# Patient Record
Sex: Male | Born: 1935 | Race: White | Hispanic: No | Smoking: Never smoker
Health system: Southern US, Community
[De-identification: ages and names within clinical notes are randomized; demographics above are authoritative.]

## PROBLEM LIST (undated history)

## (undated) DIAGNOSIS — M545 Low back pain, unspecified: Secondary | ICD-10-CM

## (undated) DIAGNOSIS — I4891 Unspecified atrial fibrillation: Secondary | ICD-10-CM

## (undated) DIAGNOSIS — R51 Headache: Secondary | ICD-10-CM

## (undated) DIAGNOSIS — M1990 Unspecified osteoarthritis, unspecified site: Secondary | ICD-10-CM

## (undated) DIAGNOSIS — I499 Cardiac arrhythmia, unspecified: Secondary | ICD-10-CM

## (undated) DIAGNOSIS — I10 Essential (primary) hypertension: Secondary | ICD-10-CM

## (undated) DIAGNOSIS — I2510 Atherosclerotic heart disease of native coronary artery without angina pectoris: Secondary | ICD-10-CM

## (undated) DIAGNOSIS — R55 Syncope and collapse: Secondary | ICD-10-CM

## (undated) DIAGNOSIS — Z8719 Personal history of other diseases of the digestive system: Secondary | ICD-10-CM

## (undated) DIAGNOSIS — E785 Hyperlipidemia, unspecified: Secondary | ICD-10-CM

## (undated) DIAGNOSIS — R0600 Dyspnea, unspecified: Secondary | ICD-10-CM

## (undated) DIAGNOSIS — I209 Angina pectoris, unspecified: Secondary | ICD-10-CM

## (undated) DIAGNOSIS — R079 Chest pain, unspecified: Secondary | ICD-10-CM

## (undated) DIAGNOSIS — K922 Gastrointestinal hemorrhage, unspecified: Secondary | ICD-10-CM

## (undated) DIAGNOSIS — I219 Acute myocardial infarction, unspecified: Secondary | ICD-10-CM

## (undated) DIAGNOSIS — K219 Gastro-esophageal reflux disease without esophagitis: Secondary | ICD-10-CM

## (undated) DIAGNOSIS — C4359 Malignant melanoma of other part of trunk: Secondary | ICD-10-CM

## (undated) DIAGNOSIS — G8929 Other chronic pain: Secondary | ICD-10-CM

## (undated) DIAGNOSIS — R519 Headache, unspecified: Secondary | ICD-10-CM

## (undated) DIAGNOSIS — R413 Other amnesia: Secondary | ICD-10-CM

## (undated) DIAGNOSIS — Z9289 Personal history of other medical treatment: Secondary | ICD-10-CM

## (undated) HISTORY — PX: BACK SURGERY: SHX140

## (undated) HISTORY — PX: CORONARY ANGIOPLASTY WITH STENT PLACEMENT: SHX49

## (undated) HISTORY — DX: Syncope and collapse: R55

## (undated) HISTORY — PX: ANTERIOR CERVICAL DECOMP/DISCECTOMY FUSION: SHX1161

## (undated) HISTORY — DX: Other amnesia: R41.3

## (undated) HISTORY — PX: CERVICAL DISC SURGERY: SHX588

## (undated) HISTORY — PX: LAMINECTOMY: SHX219

## (undated) HISTORY — DX: Gastrointestinal hemorrhage, unspecified: K92.2

## (undated) HISTORY — PX: MELANOMA EXCISION: SHX5266

## (undated) HISTORY — DX: Hyperlipidemia, unspecified: E78.5

## (undated) HISTORY — DX: Other chronic pain: G89.29

## (undated) HISTORY — DX: Essential (primary) hypertension: I10

## (undated) HISTORY — PX: CARDIAC CATHETERIZATION: SHX172

## (undated) HISTORY — PX: POSTERIOR LAMINECTOMY / DECOMPRESSION CERVICAL SPINE: SUR739

## (undated) HISTORY — DX: Dyspnea, unspecified: R06.00

## (undated) HISTORY — PX: CATARACT EXTRACTION W/ INTRAOCULAR LENS  IMPLANT, BILATERAL: SHX1307

## (undated) HISTORY — DX: Chest pain, unspecified: R07.9

## (undated) HISTORY — DX: Unspecified atrial fibrillation: I48.91

## (undated) HISTORY — DX: Atherosclerotic heart disease of native coronary artery without angina pectoris: I25.10

## (undated) HISTORY — PX: TONSILLECTOMY: SUR1361

## (undated) HISTORY — DX: Gastro-esophageal reflux disease without esophagitis: K21.9

---

## 1995-03-07 ENCOUNTER — Encounter: Payer: Self-pay | Admitting: Gastroenterology

## 1998-11-24 ENCOUNTER — Observation Stay (HOSPITAL_COMMUNITY): Admission: AD | Admit: 1998-11-24 | Discharge: 1998-11-25 | Payer: Self-pay | Admitting: *Deleted

## 1999-10-18 DIAGNOSIS — I219 Acute myocardial infarction, unspecified: Secondary | ICD-10-CM

## 1999-10-18 HISTORY — DX: Acute myocardial infarction, unspecified: I21.9

## 2001-05-24 ENCOUNTER — Encounter: Payer: Self-pay | Admitting: Emergency Medicine

## 2001-09-24 ENCOUNTER — Inpatient Hospital Stay (HOSPITAL_COMMUNITY): Admission: EM | Admit: 2001-09-24 | Discharge: 2001-09-27 | Payer: Self-pay | Admitting: Emergency Medicine

## 2001-09-24 ENCOUNTER — Encounter: Payer: Self-pay | Admitting: Emergency Medicine

## 2001-12-19 ENCOUNTER — Encounter: Payer: Self-pay | Admitting: Emergency Medicine

## 2001-12-26 ENCOUNTER — Inpatient Hospital Stay (HOSPITAL_COMMUNITY): Admission: EM | Admit: 2001-12-26 | Discharge: 2001-12-28 | Payer: Self-pay | Admitting: Emergency Medicine

## 2001-12-26 ENCOUNTER — Encounter: Payer: Self-pay | Admitting: Internal Medicine

## 2001-12-27 ENCOUNTER — Encounter: Payer: Self-pay | Admitting: Internal Medicine

## 2002-01-12 ENCOUNTER — Observation Stay (HOSPITAL_COMMUNITY): Admission: EM | Admit: 2002-01-12 | Discharge: 2002-01-13 | Payer: Self-pay

## 2002-01-12 ENCOUNTER — Encounter (INDEPENDENT_AMBULATORY_CARE_PROVIDER_SITE_OTHER): Payer: Self-pay | Admitting: *Deleted

## 2002-11-01 ENCOUNTER — Inpatient Hospital Stay (HOSPITAL_COMMUNITY): Admission: AD | Admit: 2002-11-01 | Discharge: 2002-11-05 | Payer: Self-pay | Admitting: Cardiovascular Disease

## 2002-11-13 ENCOUNTER — Encounter: Payer: Self-pay | Admitting: *Deleted

## 2002-11-13 ENCOUNTER — Inpatient Hospital Stay (HOSPITAL_COMMUNITY): Admission: EM | Admit: 2002-11-13 | Discharge: 2002-11-16 | Payer: Self-pay | Admitting: Emergency Medicine

## 2003-01-08 ENCOUNTER — Inpatient Hospital Stay (HOSPITAL_COMMUNITY): Admission: EM | Admit: 2003-01-08 | Discharge: 2003-01-10 | Payer: Self-pay | Admitting: Emergency Medicine

## 2003-01-08 ENCOUNTER — Encounter: Payer: Self-pay | Admitting: Emergency Medicine

## 2003-08-01 ENCOUNTER — Ambulatory Visit (HOSPITAL_COMMUNITY): Admission: RE | Admit: 2003-08-01 | Discharge: 2003-08-01 | Payer: Self-pay | Admitting: Neurology

## 2003-08-25 ENCOUNTER — Ambulatory Visit (HOSPITAL_COMMUNITY): Admission: RE | Admit: 2003-08-25 | Discharge: 2003-08-25 | Payer: Self-pay | Admitting: Neurology

## 2003-08-28 ENCOUNTER — Inpatient Hospital Stay (HOSPITAL_COMMUNITY): Admission: RE | Admit: 2003-08-28 | Discharge: 2003-08-30 | Payer: Self-pay | Admitting: Neurological Surgery

## 2003-09-09 ENCOUNTER — Emergency Department (HOSPITAL_COMMUNITY): Admission: EM | Admit: 2003-09-09 | Discharge: 2003-09-09 | Payer: Self-pay | Admitting: Emergency Medicine

## 2003-11-06 ENCOUNTER — Inpatient Hospital Stay (HOSPITAL_COMMUNITY): Admission: AD | Admit: 2003-11-06 | Discharge: 2003-11-07 | Payer: Self-pay | Admitting: Internal Medicine

## 2003-12-19 ENCOUNTER — Encounter: Admission: RE | Admit: 2003-12-19 | Discharge: 2003-12-19 | Payer: Self-pay | Admitting: Gastroenterology

## 2003-12-19 ENCOUNTER — Encounter: Payer: Self-pay | Admitting: Gastroenterology

## 2004-07-30 ENCOUNTER — Emergency Department (HOSPITAL_COMMUNITY): Admission: EM | Admit: 2004-07-30 | Discharge: 2004-07-30 | Payer: Self-pay | Admitting: Emergency Medicine

## 2004-10-01 ENCOUNTER — Ambulatory Visit: Payer: Self-pay | Admitting: Gastroenterology

## 2004-10-04 ENCOUNTER — Ambulatory Visit: Payer: Self-pay | Admitting: Gastroenterology

## 2004-10-07 ENCOUNTER — Ambulatory Visit: Payer: Self-pay | Admitting: Gastroenterology

## 2004-11-07 ENCOUNTER — Emergency Department (HOSPITAL_COMMUNITY): Admission: EM | Admit: 2004-11-07 | Discharge: 2004-11-07 | Payer: Self-pay | Admitting: Emergency Medicine

## 2004-11-09 ENCOUNTER — Ambulatory Visit (HOSPITAL_COMMUNITY): Admission: RE | Admit: 2004-11-09 | Discharge: 2004-11-09 | Payer: Self-pay | Admitting: Neurological Surgery

## 2004-11-11 ENCOUNTER — Ambulatory Visit (HOSPITAL_COMMUNITY): Admission: RE | Admit: 2004-11-11 | Discharge: 2004-11-12 | Payer: Self-pay | Admitting: Neurological Surgery

## 2005-01-06 ENCOUNTER — Ambulatory Visit: Payer: Self-pay | Admitting: Internal Medicine

## 2005-03-05 ENCOUNTER — Inpatient Hospital Stay (HOSPITAL_COMMUNITY): Admission: EM | Admit: 2005-03-05 | Discharge: 2005-03-08 | Payer: Self-pay | Admitting: Emergency Medicine

## 2005-03-05 ENCOUNTER — Ambulatory Visit: Payer: Self-pay | Admitting: *Deleted

## 2005-03-21 ENCOUNTER — Ambulatory Visit: Payer: Self-pay | Admitting: Cardiology

## 2005-07-21 ENCOUNTER — Ambulatory Visit: Payer: Self-pay | Admitting: Internal Medicine

## 2005-08-09 ENCOUNTER — Ambulatory Visit: Payer: Self-pay | Admitting: Gastroenterology

## 2005-08-15 ENCOUNTER — Ambulatory Visit: Payer: Self-pay | Admitting: Gastroenterology

## 2005-12-24 ENCOUNTER — Inpatient Hospital Stay (HOSPITAL_COMMUNITY): Admission: EM | Admit: 2005-12-24 | Discharge: 2005-12-27 | Payer: Self-pay | Admitting: Emergency Medicine

## 2005-12-24 ENCOUNTER — Ambulatory Visit: Payer: Self-pay | Admitting: Internal Medicine

## 2006-01-02 ENCOUNTER — Ambulatory Visit: Payer: Self-pay | Admitting: Cardiology

## 2006-01-02 ENCOUNTER — Inpatient Hospital Stay (HOSPITAL_COMMUNITY): Admission: EM | Admit: 2006-01-02 | Discharge: 2006-01-04 | Payer: Self-pay | Admitting: Emergency Medicine

## 2006-01-30 ENCOUNTER — Ambulatory Visit: Payer: Self-pay | Admitting: Internal Medicine

## 2006-03-21 ENCOUNTER — Ambulatory Visit: Payer: Self-pay | Admitting: Cardiology

## 2006-03-28 ENCOUNTER — Ambulatory Visit: Payer: Self-pay

## 2006-04-07 ENCOUNTER — Ambulatory Visit: Payer: Self-pay | Admitting: Internal Medicine

## 2006-09-18 ENCOUNTER — Ambulatory Visit: Payer: Self-pay | Admitting: Internal Medicine

## 2007-01-09 ENCOUNTER — Ambulatory Visit: Payer: Self-pay | Admitting: Gastroenterology

## 2007-01-26 ENCOUNTER — Ambulatory Visit: Payer: Self-pay | Admitting: Gastroenterology

## 2007-01-26 LAB — CONVERTED CEMR LAB

## 2007-05-18 ENCOUNTER — Inpatient Hospital Stay (HOSPITAL_COMMUNITY): Admission: AD | Admit: 2007-05-18 | Discharge: 2007-05-21 | Payer: Self-pay | Admitting: Internal Medicine

## 2007-05-18 ENCOUNTER — Ambulatory Visit: Payer: Self-pay | Admitting: Internal Medicine

## 2007-06-01 ENCOUNTER — Ambulatory Visit: Payer: Self-pay | Admitting: Internal Medicine

## 2007-08-17 ENCOUNTER — Encounter: Admission: RE | Admit: 2007-08-17 | Discharge: 2007-08-17 | Payer: Self-pay | Admitting: Neurology

## 2007-10-04 ENCOUNTER — Ambulatory Visit: Payer: Self-pay | Admitting: Critical Care Medicine

## 2007-10-04 ENCOUNTER — Inpatient Hospital Stay (HOSPITAL_COMMUNITY): Admission: RE | Admit: 2007-10-04 | Discharge: 2007-10-14 | Payer: Self-pay | Admitting: Neurological Surgery

## 2007-10-04 ENCOUNTER — Ambulatory Visit: Payer: Self-pay | Admitting: Internal Medicine

## 2007-10-05 ENCOUNTER — Encounter (INDEPENDENT_AMBULATORY_CARE_PROVIDER_SITE_OTHER): Payer: Self-pay | Admitting: Neurological Surgery

## 2007-11-30 ENCOUNTER — Ambulatory Visit: Payer: Self-pay | Admitting: Internal Medicine

## 2008-02-06 ENCOUNTER — Ambulatory Visit: Payer: Self-pay | Admitting: *Deleted

## 2008-02-06 ENCOUNTER — Inpatient Hospital Stay (HOSPITAL_COMMUNITY): Admission: EM | Admit: 2008-02-06 | Discharge: 2008-02-07 | Payer: Self-pay | Admitting: Emergency Medicine

## 2008-03-14 ENCOUNTER — Ambulatory Visit: Payer: Self-pay | Admitting: Internal Medicine

## 2008-03-25 ENCOUNTER — Ambulatory Visit: Payer: Self-pay | Admitting: Internal Medicine

## 2008-03-29 ENCOUNTER — Emergency Department (HOSPITAL_COMMUNITY): Admission: EM | Admit: 2008-03-29 | Discharge: 2008-03-29 | Payer: Self-pay | Admitting: Emergency Medicine

## 2008-03-30 ENCOUNTER — Emergency Department (HOSPITAL_COMMUNITY): Admission: EM | Admit: 2008-03-30 | Discharge: 2008-03-30 | Payer: Self-pay | Admitting: Emergency Medicine

## 2008-07-14 ENCOUNTER — Telehealth: Payer: Self-pay | Admitting: Gastroenterology

## 2008-09-19 ENCOUNTER — Ambulatory Visit: Payer: Self-pay | Admitting: Internal Medicine

## 2008-09-19 LAB — CONVERTED CEMR LAB
BUN: 9 mg/dL (ref 6–23)
Basophils Absolute: 0.1 10*3/uL (ref 0.0–0.1)
Creatinine, Ser: 0.8 mg/dL (ref 0.4–1.5)
Eosinophils Relative: 4.6 % (ref 0.0–5.0)
GFR calc Af Amer: 121 mL/min
HCT: 35.5 % — ABNORMAL LOW (ref 39.0–52.0)
Hemoglobin: 11.9 g/dL — ABNORMAL LOW (ref 13.0–17.0)
INR: 1.1 — ABNORMAL HIGH (ref 0.8–1.0)
Lymphocytes Relative: 31.3 % (ref 12.0–46.0)
MCHC: 33.7 g/dL (ref 30.0–36.0)
Monocytes Absolute: 0.6 10*3/uL (ref 0.1–1.0)
Monocytes Relative: 12.1 % — ABNORMAL HIGH (ref 3.0–12.0)
Neutrophils Relative %: 51 % (ref 43.0–77.0)
Platelets: 163 10*3/uL (ref 150–400)
Prothrombin Time: 11.5 s (ref 10.9–13.3)
RDW: 14.4 % (ref 11.5–14.6)

## 2008-09-22 ENCOUNTER — Inpatient Hospital Stay (HOSPITAL_BASED_OUTPATIENT_CLINIC_OR_DEPARTMENT_OTHER): Admission: RE | Admit: 2008-09-22 | Discharge: 2008-09-22 | Payer: Self-pay | Admitting: Cardiology

## 2008-09-22 ENCOUNTER — Ambulatory Visit: Payer: Self-pay | Admitting: Cardiology

## 2008-10-16 ENCOUNTER — Ambulatory Visit: Payer: Self-pay | Admitting: Internal Medicine

## 2008-10-20 DIAGNOSIS — R0602 Shortness of breath: Secondary | ICD-10-CM | POA: Insufficient documentation

## 2008-10-20 DIAGNOSIS — I2510 Atherosclerotic heart disease of native coronary artery without angina pectoris: Secondary | ICD-10-CM

## 2008-10-20 DIAGNOSIS — E785 Hyperlipidemia, unspecified: Secondary | ICD-10-CM

## 2008-10-20 DIAGNOSIS — I10 Essential (primary) hypertension: Secondary | ICD-10-CM | POA: Insufficient documentation

## 2008-10-20 DIAGNOSIS — J45909 Unspecified asthma, uncomplicated: Secondary | ICD-10-CM | POA: Insufficient documentation

## 2008-10-20 DIAGNOSIS — K219 Gastro-esophageal reflux disease without esophagitis: Secondary | ICD-10-CM

## 2008-10-30 ENCOUNTER — Ambulatory Visit: Payer: Self-pay | Admitting: Emergency Medicine

## 2008-11-10 ENCOUNTER — Telehealth (INDEPENDENT_AMBULATORY_CARE_PROVIDER_SITE_OTHER): Payer: Self-pay | Admitting: *Deleted

## 2008-11-27 ENCOUNTER — Ambulatory Visit: Payer: Self-pay | Admitting: Emergency Medicine

## 2008-12-01 ENCOUNTER — Telehealth: Payer: Self-pay | Admitting: Emergency Medicine

## 2008-12-18 ENCOUNTER — Ambulatory Visit: Payer: Self-pay | Admitting: Emergency Medicine

## 2008-12-22 ENCOUNTER — Encounter: Payer: Self-pay | Admitting: Emergency Medicine

## 2009-01-21 ENCOUNTER — Ambulatory Visit: Payer: Self-pay | Admitting: Emergency Medicine

## 2009-05-01 DIAGNOSIS — Z87898 Personal history of other specified conditions: Secondary | ICD-10-CM | POA: Insufficient documentation

## 2009-05-01 DIAGNOSIS — Z8659 Personal history of other mental and behavioral disorders: Secondary | ICD-10-CM

## 2009-05-01 DIAGNOSIS — I252 Old myocardial infarction: Secondary | ICD-10-CM | POA: Insufficient documentation

## 2009-05-01 DIAGNOSIS — Z9889 Other specified postprocedural states: Secondary | ICD-10-CM

## 2009-05-01 DIAGNOSIS — J301 Allergic rhinitis due to pollen: Secondary | ICD-10-CM

## 2009-05-01 DIAGNOSIS — G609 Hereditary and idiopathic neuropathy, unspecified: Secondary | ICD-10-CM | POA: Insufficient documentation

## 2009-05-01 DIAGNOSIS — Z8719 Personal history of other diseases of the digestive system: Secondary | ICD-10-CM | POA: Insufficient documentation

## 2009-05-01 DIAGNOSIS — Z87442 Personal history of urinary calculi: Secondary | ICD-10-CM

## 2009-05-07 ENCOUNTER — Ambulatory Visit: Payer: Self-pay | Admitting: Internal Medicine

## 2009-05-08 ENCOUNTER — Emergency Department (HOSPITAL_COMMUNITY): Admission: EM | Admit: 2009-05-08 | Discharge: 2009-05-09 | Payer: Self-pay | Admitting: Emergency Medicine

## 2009-05-09 ENCOUNTER — Encounter (INDEPENDENT_AMBULATORY_CARE_PROVIDER_SITE_OTHER): Payer: Self-pay | Admitting: *Deleted

## 2009-05-27 ENCOUNTER — Ambulatory Visit: Payer: Self-pay | Admitting: Internal Medicine

## 2009-06-01 ENCOUNTER — Encounter: Payer: Self-pay | Admitting: Internal Medicine

## 2009-06-01 LAB — CONVERTED CEMR LAB
AST: 29 units/L (ref 0–37)
Cholesterol: 172 mg/dL (ref 0–200)
HDL: 46.3 mg/dL (ref >39.00)
LDL Cholesterol: 102 mg/dL — ABNORMAL HIGH (ref 0–99)
Total CHOL/HDL Ratio: 4
Triglycerides: 119 mg/dL (ref 0.0–149.0)

## 2009-07-20 ENCOUNTER — Ambulatory Visit: Payer: Self-pay | Admitting: Internal Medicine

## 2009-07-28 ENCOUNTER — Encounter: Payer: Self-pay | Admitting: Internal Medicine

## 2009-07-28 LAB — CONVERTED CEMR LAB
HDL: 42.3 mg/dL (ref >39.00)
Total CHOL/HDL Ratio: 4
VLDL: 16.4 mg/dL (ref 0.0–40.0)

## 2009-08-03 ENCOUNTER — Telehealth: Payer: Self-pay | Admitting: Internal Medicine

## 2009-09-27 ENCOUNTER — Ambulatory Visit: Payer: Self-pay | Admitting: Cardiovascular Disease

## 2009-09-27 ENCOUNTER — Inpatient Hospital Stay (HOSPITAL_COMMUNITY): Admission: EM | Admit: 2009-09-27 | Discharge: 2009-09-29 | Payer: Self-pay | Admitting: Emergency Medicine

## 2009-10-14 ENCOUNTER — Ambulatory Visit: Payer: Self-pay | Admitting: Internal Medicine

## 2009-10-19 LAB — CONVERTED CEMR LAB
AST: 19 units/L (ref 0–37)
Cholesterol: 134 mg/dL (ref 0–200)
Total CHOL/HDL Ratio: 3

## 2009-11-23 ENCOUNTER — Ambulatory Visit: Payer: Self-pay | Admitting: Internal Medicine

## 2009-11-23 ENCOUNTER — Telehealth (INDEPENDENT_AMBULATORY_CARE_PROVIDER_SITE_OTHER): Payer: Self-pay | Admitting: *Deleted

## 2010-03-01 ENCOUNTER — Emergency Department (HOSPITAL_COMMUNITY): Admission: EM | Admit: 2010-03-01 | Discharge: 2010-03-01 | Payer: Self-pay | Admitting: Emergency Medicine

## 2010-04-09 ENCOUNTER — Telehealth (INDEPENDENT_AMBULATORY_CARE_PROVIDER_SITE_OTHER): Payer: Self-pay | Admitting: *Deleted

## 2010-04-23 ENCOUNTER — Ambulatory Visit: Payer: Self-pay | Admitting: Cardiology

## 2010-04-23 ENCOUNTER — Inpatient Hospital Stay (HOSPITAL_COMMUNITY): Admission: EM | Admit: 2010-04-23 | Discharge: 2010-04-25 | Payer: Self-pay | Admitting: Emergency Medicine

## 2010-04-24 ENCOUNTER — Encounter: Payer: Self-pay | Admitting: Cardiology

## 2010-05-04 ENCOUNTER — Ambulatory Visit: Payer: Self-pay | Admitting: Internal Medicine

## 2010-05-05 ENCOUNTER — Encounter: Payer: Self-pay | Admitting: Internal Medicine

## 2010-05-06 LAB — CONVERTED CEMR LAB
Calcium: 9.4 mg/dL (ref 8.4–10.5)
GFR calc non Af Amer: 101.09 mL/min (ref >60)
Glucose, Bld: 110 mg/dL — ABNORMAL HIGH (ref 70–99)
Potassium: 4.5 meq/L (ref 3.5–5.1)

## 2010-05-07 ENCOUNTER — Ambulatory Visit: Payer: Self-pay | Admitting: Internal Medicine

## 2010-07-27 ENCOUNTER — Encounter: Payer: Self-pay | Admitting: Gastroenterology

## 2010-07-27 ENCOUNTER — Telehealth: Payer: Self-pay | Admitting: Gastroenterology

## 2010-08-17 ENCOUNTER — Ambulatory Visit: Payer: Self-pay | Admitting: Internal Medicine

## 2010-08-17 ENCOUNTER — Encounter (INDEPENDENT_AMBULATORY_CARE_PROVIDER_SITE_OTHER): Payer: Self-pay | Admitting: *Deleted

## 2010-08-17 ENCOUNTER — Emergency Department (HOSPITAL_COMMUNITY): Admission: EM | Admit: 2010-08-17 | Discharge: 2010-08-17 | Payer: Self-pay | Admitting: Emergency Medicine

## 2010-08-18 ENCOUNTER — Telehealth: Payer: Self-pay | Admitting: Internal Medicine

## 2010-08-18 ENCOUNTER — Encounter: Payer: Self-pay | Admitting: Internal Medicine

## 2010-08-18 ENCOUNTER — Ambulatory Visit: Payer: Self-pay

## 2010-08-18 DIAGNOSIS — Z8679 Personal history of other diseases of the circulatory system: Secondary | ICD-10-CM | POA: Insufficient documentation

## 2010-08-26 ENCOUNTER — Ambulatory Visit: Payer: Self-pay | Admitting: Gastroenterology

## 2010-08-26 DIAGNOSIS — K625 Hemorrhage of anus and rectum: Secondary | ICD-10-CM | POA: Insufficient documentation

## 2010-08-26 DIAGNOSIS — R1084 Generalized abdominal pain: Secondary | ICD-10-CM

## 2010-08-26 DIAGNOSIS — R1319 Other dysphagia: Secondary | ICD-10-CM | POA: Insufficient documentation

## 2010-08-26 DIAGNOSIS — Z860100 Personal history of colon polyps, unspecified: Secondary | ICD-10-CM | POA: Insufficient documentation

## 2010-08-26 DIAGNOSIS — K589 Irritable bowel syndrome without diarrhea: Secondary | ICD-10-CM

## 2010-08-26 DIAGNOSIS — K6289 Other specified diseases of anus and rectum: Secondary | ICD-10-CM

## 2010-08-26 DIAGNOSIS — R630 Anorexia: Secondary | ICD-10-CM

## 2010-08-26 DIAGNOSIS — R112 Nausea with vomiting, unspecified: Secondary | ICD-10-CM

## 2010-08-26 DIAGNOSIS — Z872 Personal history of diseases of the skin and subcutaneous tissue: Secondary | ICD-10-CM | POA: Insufficient documentation

## 2010-08-26 DIAGNOSIS — M129 Arthropathy, unspecified: Secondary | ICD-10-CM | POA: Insufficient documentation

## 2010-08-26 LAB — CONVERTED CEMR LAB
Folate: >20 ng/mL
Saturation Ratios: 26.9 % (ref 20.0–50.0)
Transferrin: 220.5 mg/dL (ref 212.0–360.0)

## 2010-08-27 ENCOUNTER — Encounter: Payer: Self-pay | Admitting: Internal Medicine

## 2010-08-30 ENCOUNTER — Encounter: Payer: Self-pay | Admitting: Gastroenterology

## 2010-08-30 ENCOUNTER — Ambulatory Visit: Payer: Self-pay | Admitting: Internal Medicine

## 2010-08-30 DIAGNOSIS — R42 Dizziness and giddiness: Secondary | ICD-10-CM | POA: Insufficient documentation

## 2010-09-01 LAB — CONVERTED CEMR LAB
CO2: 30 meq/L (ref 19–32)
Calcium: 9.1 mg/dL (ref 8.4–10.5)
Chloride: 100 meq/L (ref 96–112)
Creatinine, Ser: 0.8 mg/dL (ref 0.4–1.5)
Glucose, Bld: 100 mg/dL — ABNORMAL HIGH (ref 70–99)
Nitrite: NEGATIVE
Potassium: 3.7 meq/L (ref 3.5–5.1)
Total Protein, Urine: NEGATIVE mg/dL
Urine Glucose: NEGATIVE mg/dL
Urobilinogen, UA: 0.2 (ref 0.0–1.0)

## 2010-09-20 ENCOUNTER — Ambulatory Visit: Payer: Self-pay | Admitting: Gastroenterology

## 2010-09-20 DIAGNOSIS — K222 Esophageal obstruction: Secondary | ICD-10-CM | POA: Insufficient documentation

## 2010-09-23 ENCOUNTER — Ambulatory Visit: Payer: Self-pay | Admitting: Internal Medicine

## 2010-10-22 ENCOUNTER — Ambulatory Visit
Admission: RE | Admit: 2010-10-22 | Discharge: 2010-10-22 | Payer: Self-pay | Source: Home / Self Care | Attending: Internal Medicine | Admitting: Internal Medicine

## 2010-10-22 ENCOUNTER — Other Ambulatory Visit: Payer: Self-pay | Admitting: Internal Medicine

## 2010-10-22 LAB — BASIC METABOLIC PANEL
BUN: 8 mg/dL (ref 6–23)
CO2: 28 mEq/L (ref 19–32)
Calcium: 9.3 mg/dL (ref 8.4–10.5)
Chloride: 106 mEq/L (ref 96–112)
Creatinine, Ser: 0.7 mg/dL (ref 0.4–1.5)
GFR: 114.21 mL/min (ref >60.00)
Glucose, Bld: 94 mg/dL (ref 70–99)
Potassium: 4.2 mEq/L (ref 3.5–5.1)
Sodium: 143 mEq/L (ref 135–145)

## 2010-10-22 LAB — AST: AST: 23 U/L (ref 0–37)

## 2010-10-22 LAB — LIPID PANEL
Cholesterol: 142 mg/dL (ref 0–200)
HDL: 47 mg/dL (ref >39.00)
LDL Cholesterol: 83 mg/dL (ref 0–99)
Total CHOL/HDL Ratio: 3
Triglycerides: 62 mg/dL (ref 0.0–149.0)
VLDL: 12.4 mg/dL (ref 0.0–40.0)

## 2010-11-16 NOTE — Miscellaneous (Signed)
Summary: Orders Update-clotest  Clinical Lists Changes  Problems: Added new problem of ESOPHAGEAL STRICTURE (ICD-530.3) Orders: Added new Test order of TLB-H Pylori Screen Gastric Biopsy (83013-CLOTEST) - Signed

## 2010-11-16 NOTE — Assessment & Plan Note (Signed)
Summary: 7 MONTH ROV/SL  Medications Added VENTOLIN HFA 108 (90 BASE) MCG/ACT  AERS (ALBUTEROL SULFATE) 1-2 puffs every 4-6 hours as needed      Allergies Added:   Visit Type:  Follow-up Referring Provider:  Dr Dietrich Pates Primary Provider:  Dr Claretta Fraise  CC:  no complaints.  History of Present Illness: Patient is a 77 year old with a history of CAD.  He was recently admitted with chest pain.  He underwnet cardiac catheterization that showed:  LAD:  irregularities, 30% D2; LCx:  OM1 small with 70% osial lesion;  RCA with 40% prox, 30% instent.    LVEF 55%.  Plan was to continue medical Rx.  Search for possible GI sources of chest pain. Since d/c his symptoms have not recurred.  He does have a problem with IBS and diverticultis. Breathing is ok.  Current Medications (verified): 1)  Ventolin Hfa 108 (90 Base) Mcg/act  Aers (Albuterol Sulfate) .Marland Kitchen.. 1-2 Puffs Every 4-6 Hours As Needed 2)  Flomax 0.4 Mg Xr24h-Cap (Tamsulosin Hcl) .... Once Daily 3)  Fexofenadine Hcl 180 Mg Tabs (Fexofenadine Hcl) .... Take 1 Tablet By Mouth Once A Day 4)  Furosemide 20 Mg Tabs (Furosemide) .... Take 1 Tablet By Mouth Once A Day As Needed 5)  Hyomax-Sl 0.125 Mg Subl (Hyoscyamine Sulfate) .Marland Kitchen.. 1 Every 8 Hrs 6)  Metoprolol Tartrate 50 Mg Tabs (Metoprolol Tartrate) .... Take 1 1/2 Tablets Two Times A Day 7)  Nexium 40 Mg Cpdr (Esomeprazole Magnesium) .... Take 1 Tablet By Mouth Two Times A Day 8)  Plavix 75 Mg Tabs (Clopidogrel Bisulfate) .... Take 1 Tablet By Mouth Once A Day 9)  Bayer Aspirin 325 Mg Tabs (Aspirin) .... Take 1 Tablet By Mouth Once A Day 10)  Nitrostat 0.4 Mg Subl (Nitroglycerin) .... As Needed 11)  Imdur 60 Mg Xr24h-Tab (Isosorbide Mononitrate) .... Take 1 Tablet By Mouth Once A Day 12)  Singulair 10 Mg Tabs (Montelukast Sodium) .... Take 1 Tablet By Mouth Once A Day 13)  Caduet 5-80 Mg Tabs (Amlodipine-Atorvastatin) .... Take One Tablet By Mouth Once Daily  Allergies (verified): 1)  !  Demerol 2)  ! Sulfa 3)  ! Zocor 4)  ! Symbicort (Budesonide-Formoterol Fumarate)  Past History:  Past Medical History: Last updated: 05/01/2009 ALLERGIC RHINITIS, SEASONAL (ICD-477.0) HEMORRHOIDS, HX OF (ICD-V12.79) DIVERTICULOSIS, COLON, HX OF (ICD-V12.79) NEPHROLITHIASIS, HX OF (ICD-V13.01) MYOCARDIAL INFARCTION (ICD-410.90) GAIT DISTURBANCE, HX OF (ICD-781.2) PERIPHERAL NEUROPATHY (ICD-356.9) DEPRESSION, HX OF (ICD-V11.8) BENIGN PROSTATIC HYPERTROPHY, HX OF (ICD-V13.8) ? of SLEEP APNEA (ICD-780.57) CAD, NATIVE VESSEL (ICD-414.01) HYPERLIPIDEMIA-MIXED (ICD-272.4) ESSENTIAL HYPERTENSION (ICD-401.9) DYSPNEA (ICD-786.05) ASTHMA, UNSPECIFIED, UNSPECIFIED STATUS (ICD-493.90) GERD (ICD-530.81)    Past Surgical History: Last updated: 05/01/2009 * ANTERIOR CERVICAL DECOMPRESSION LAMINECTOMY, HX OF (ICD-V45.89)  Family History: Last updated: 10/30/2008 Father with hx diabetes, asthma, heart disease Mother died of CAD, hx CABG age 75  Social History: Last updated: 10/30/2008 No EtOH Patient never smoked.  Pt is widowed with children. 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    Review of Systems       All systmes reviewed.  Negative to the above problem excpt as noted.  Vital Signs:  Patient profile:   77 year old male Height:      70 inches Weight:      184 pounds Pulse rate:   68 / minute BP sitting:   140 / 80  (left arm) Cuff size:   regular  Vitals Entered By: Burnett Kanaris, CNA (November 23, 2009 2:03 PM)  Physical Exam  Additional Exam:  HEENT:  Normocephalic, atraumatic. EOMI, PERRLA.  Neck: JVP is normal. No thyromegaly. No bruits.  Lungs: clear to auscultation. No rales no wheezes.  Heart: Regular rate and rhythm. Normal S1, S2. No S3.   No significant murmurs. PMI not displaced.  Abdomen:  Supple, nontender. Normal bowel sounds. No masses. No hepatomegaly.  Extremities:   Good distal pulses throughout. No lower extremity edema.    Musculoskeletal :moving all extremities.  Neuro:   alert and oriented x3.    EKG  Procedure date:  11/23/2009  Findings:      Sinus bradycardia 58 bpm  Impression & Recommendations:  Problem # 1:  CAD, NATIVE VESSEL (ICD-414.01) Reslusts to cath as noted above.  Continue medical  therapy.  Problem # 2:  HYPERLIPIDEMIA-MIXED (ICD-272.4) COntinue meds. His updated medication list for this problem includes:    Caduet 5-80 Mg Tabs (Amlodipine-atorvastatin) .Marland Kitchen... Take one tablet by mouth once daily  Problem # 3:  ESSENTIAL HYPERTENSION (ICD-401.9) Good control  Problem # 4:  GERD (ICD-530.81) Patient with history of IBS and diverticulitis as well.  Refer to GI.  Other Orders: EKG w/ Interpretation (93000)

## 2010-11-16 NOTE — Progress Notes (Signed)
Summary: holter monitor.   Phone Note From Other Clinic   Reason for Call: Schedule Patient Appt  Follow-up for Phone Call        pt is coming from hospital to be set up for a 24 hour holter monitor.  Whitney Maeola Sarah RN  August 18, 2010 12:34 PM  Follow-up by: Whitney Maeola Sarah RN,  August 18, 2010 12:34 PM  New Problems: PALPITATIONS, HX OF (ICD-V12.50)   New Problems: PALPITATIONS, HX OF (ICD-V12.50)

## 2010-11-16 NOTE — Letter (Signed)
Summary: [facility] Instructions  [facility]  [address] [place], [place] [address]   Phone: [phone]  Fax: [phone]       [name]    May 09, 1931    MRN: [id number]        Procedure Day /Date: Monday December 5th, 2011     Arrival Time: 1:30pm     Procedure Time: 2:30pm     Location of Procedure:                    _x _  [facility] (4th Floor)                        PREPARATION FOR COLONOSCOPY WITH MOVIPREP   Starting 5 days prior to your procedure 09/15/10 do not eat nuts, seeds, popcorn, corn, beans, peas,  salads, or any raw vegetables.  Do not take any fiber supplements (e.g. Metamucil, Citrucel, and Benefiber).  THE DAY BEFORE YOUR PROCEDURE         DATE: 09/19/10  DAY: Sunday  1.  Drink clear liquids the entire day-NO SOLID FOOD  2.  Do not drink anything colored red or purple.  Avoid juices with pulp.  No orange juice.  3.  Drink at least 64 oz. (8 glasses) of fluid/clear liquids during the day to prevent dehydration and help the prep work efficiently.  CLEAR LIQUIDS INCLUDE: Water Jello Ice Popsicles Tea (sugar ok, no milk/cream) Powdered fruit flavored drinks Coffee (sugar ok, no milk/cream) Gatorade Juice: apple, white grape, white cranberry  Lemonade Clear bullion, consomm, broth Carbonated beverages (any kind) Strained chicken noodle soup Hard Candy                             4.  In the morning, mix first dose of MoviPrep solution:    Empty 1 Pouch A and 1 Pouch B into the disposable container    Add lukewarm drinking water to the top line of the container. Mix to dissolve    Refrigerate (mixed solution should be used within 24 hrs)  5.  Begin drinking the prep at 5:00 p.m. The MoviPrep container is divided by 4 marks.   Every 15 minutes drink the solution down to the next mark (approximately 8 oz) until the full liter is complete.   6.  Follow completed prep with 16 oz of clear liquid of your choice  (Nothing red or purple).  Continue to drink clear liquids until bedtime.  7.  Before going to bed, mix second dose of MoviPrep solution:    Empty 1 Pouch A and 1 Pouch B into the disposable container    Add lukewarm drinking water to the top line of the container. Mix to dissolve    Refrigerate  THE DAY OF YOUR PROCEDURE      DATE: 09/20/10 DAY: Monday  Beginning at 9:30 a.m. (5 hours before procedure):         1. Every 15 minutes, drink the solution down to the next mark (approx 8 oz) until the full liter is complete.  2. Follow completed prep with 16 oz. of clear liquid of your choice.    3. You may drink clear liquids until 12:30pm (2 HOURS BEFORE PROCEDURE).   MEDICATION INSTRUCTIONS  Unless otherwise instructed, you should take regular prescription medications with a small sip of water   as early as possible the morning of your  procedure.   HOLD PLAVIX 5 DAYS BEFORE PROCEDURES starting on 09/15/10.         OTHER INSTRUCTIONS  You will need a responsible adult at least 75 years of age to accompany you and drive you home.   This person must remain in the waiting room during your procedure.  Wear loose fitting clothing that is easily removed.  Leave jewelry and other valuables at home.  However, you may wish to bring a book to read or  an iPod/MP3 player to listen to music as you wait for your procedure to start.  Remove all body piercing jewelry and leave at home.  Total time from sign-in until discharge is approximately 2-3 hours.  You should go home directly after your procedure and rest.  You can resume normal activities the  day after your procedure.  The day of your procedure you should not:   Drive   Make legal decisions   Operate machinery   Drink alcohol   Return to work  You will receive specific instructions about eating, activities and medications before you leave.    The above instructions have been reviewed and explained to me by    _______________________    I fully understand and can verbalize these instructions _____________________________ Date _________

## 2010-11-16 NOTE — Consult Note (Signed)
Summary: Chest pain and dizziness  NAME:  [name], [name]               ACCOUNT NO.:  [id number]      MEDICAL RECORD NO.:  [id number]          PATIENT TYPE:  EMS      LOCATION:  [facility]                         FACILITY:  [facility]      PHYSICIAN:  Bevelyn Buckles. Roel Douthat, MDDATE OF BIRTH:  [birth year]      DATE OF CONSULTATION:  08/17/2010   DATE OF DISCHARGE:                                    CONSULTATION         PRIMARY CARE PHYSICIAN:  Dr. Claretta Fraise.      CARDIOLOGIST:  Dr. Dietrich Pates.      REFERRING PHYSICIAN:  Lear Ng, MD in the Emergency Room.      REASON FOR CONSULTATION:  Chest pain and dizziness.      [name] is a 77 year old male with a history of coronary artery   disease.  He is status post previous percutaneous intervention on the   RCA and the diagonal branch several years ago.  He has had a history of   chronic chest pain with multiple recall recent catheterizations  both to   December of 2010,  December of 2009 and August of 2008 which showed   patent revascularization with only nonobstructive disease.  He also has   a history of asthma, hypertension, hyperlipidemia, chronic dyspnea,   gastroesophageal reflux disease and back pain.      In reviewing his records he most recently was admitted in July of 2011   with recurrent chest pain.  Workup was negative.  He was discharged home   with follow-up.      He was in his usual state of health.  Today he woke up.  He felt   somewhat dizzy and developed some mild chest pain.  He went to see his   primary care physician for his already scheduled appointment.  An EKG   was obtained which was normal.  However, given his symptoms, he was   referred to the emergency room.  He said the pain came and went but   resolved after 30-4- minutes.  He did  experience some help with   nitroglycerin but this is common for him.  He is currently pain-free.   His dizziness is much improved.  In talking to his son, he says he has   had episodes of dizziness in the past and this has not really changed   currently.  He denies any focal neurologic symptoms.      REVIEW OF SYSTEMS:  He denies any fevers or chills.  He has not had   vertigo.  He does have arthritis pain.  No nausea or vomiting.  No   bleeding.  He has been compliant with all his medications.  Remainder of   the review of systems is negative except for HPI and problem list.      PAST MEDICAL HISTORY:   1. Coronary artery disease.   2. A.  Status post prior PCI to the RCA and PTCA to the diagonal.   3. B.  History  of history of chronic chest pain with several       subsequent cardiac catheterizations showing nonobstructive disease.       Most recently in December 2010.   4. Hypertension.   5. Hyperlipidemia.   6. Chronic dyspnea.   7. Asthma.   8. GERD.   9. Status post anterior cervical decompression.   10.Irritable bowel syndrome.   11.BPH.      CURRENT MEDICATIONS:   1. Ventolin.   2. Flomax.   3. Allegra.   4. Lasix.   5. HyoMax.   6. Metoprolol 75 b.i.d.   7. Nexium 40 b.i.d.   8. Plavix 75 a day.   9. Aspirin 325 a day .   10.Sublingual nitroglycerin p.r.n.   11.Imdur 30 a day.   12.Singulair 10 a day.   13.Caduet 5/80.      ALLERGIES:  SULFA, MEPERIDINE,  ZOCOR AND SYMBICORT.      SOCIAL HISTORY:  He is widowed.  He lives in his house alone but he has   children who live on the same property.  He is retired.  He denies any   tobacco or alcohol use.      FAMILY HISTORY:  Mother died at  47 with a  history of coronary artery   disease status post CABG.  Father had a history of CAD, asthma and   diabetes.      PHYSICAL EXAMINATION:  He is an elderly male in no acute distress.   He ambulates with a slow gait but he is  not unsteady.   Blood pressure is 123/58, heart rate 64.  He is afebrile.   HEENT:  Normal except for poor dentition.   NECK:  Supple.  There is no JVD.  Carotids are 2+ bilaterally without   any bruits.  There is  no lymphadenopathy or thyromegaly.  CARDIAC:  PMI   is nondisplaced.  He is regular with an S4.  No murmurs.   LUNGS:  Clear.   ABDOMEN:  Soft, nontender, nondistended.  No hepatosplenomegaly, no   bruits.  No masses.   EXTREMITIES:  Warm with no cyanosis, clubbing or edema.   NEURO:  He is alert and oriented x3.  Cranial nerves II-XII are grossly   intact.  He moves all 4 extremities without difficulty.  He walks with a   slow but steady gait.  There is no pronator drift.  Finger-to-nose   examination is intact bilaterally.      EKG shows sinus rhythm,  rate of 74, no ST-T wave abnormalities.  He   does have occasional salvos of PVCs.  But no nonsustained V-tach.  Chest   x-ray with no acute disease.      LABORATORY DATA:  White count 5.7, hemoglobin 13.0, platelet 201, sodium   137, potassium 3.7, BUN 10, creatinine 0.8, glucose 113.  Point of care   markers are negative x2.      ASSESSMENT:   1. Chest pain, atypical.   2. Coronary artery disease status post previous coronary interventions       with several recent cardiac catheterizations showing nonobstructive       disease.   3. Dizziness with no focal findings on exam.      PLAN/ DISCUSSION:  I suspect [name] chest pain is noncardiac.  He   does have episodes of dizziness but neuro exam is completely nonfocal.   I am wondering whether these may be related to his PVCs.  I feel  this is   probably unlikely but to be sure we will place a 48-hour Holter monitor   to assess his volume of PVCs and for any a higher grade arrhythmias.  I   have discussed this with both him and his son.  We will discharge him   home tonight and he will follow up with Dr. Tenny Craw.               Bevelyn Buckles. Maricruz Lucero, MD               DRB/MEDQ  D:  08/17/2010  T:  08/17/2010  Job:  [id number]

## 2010-11-16 NOTE — Procedures (Signed)
Summary: Summary Report  Summary Report   Imported By: Erle Crocker 09/08/2010 16:01:01  _____________________________________________________________________  External Attachment:    Type:   Image     Comment:   External Document

## 2010-11-16 NOTE — Procedures (Signed)
Summary: Colonoscopy  Patient: [name] Note: All result statuses are Final unless otherwise noted.  Tests: (1) Colonoscopy (COL)   COL Colonoscopy           DONE     [facility]     [address]     [place], [place]  [address]           COLONOSCOPY PROCEDURE REPORT           PATIENT:  [name], [name]  MR#:  [id number]     BIRTHDATE:  03-01-1931, 79 yrs. old  GENDER:  male     ENDOSCOPIST:  [name], MD, [facility]     REF. BY:     PROCEDURE DATE:  09/20/2010     PROCEDURE:  Diagnostic Colonoscopy     ASA CLASS:  Class III     INDICATIONS:  Abdominal pain, hematochezia     MEDICATIONS:   Fentanyl 25 mcg IV, Versed 3 mg IV           DESCRIPTION OF PROCEDURE:   After the risks benefits and     alternatives of the procedure were thoroughly explained, informed     consent was obtained.  Digital rectal exam was performed and     revealed tender.   The [id number] endoscope was introduced     through the anus and advanced to the cecum, which was identified     by both the appendix and ileocecal valve, without limitations.     The quality of the prep was good, using MoviPrep.  The instrument     was then slowly withdrawn as the colon was fully examined.     <<PROCEDUREIMAGES>>           FINDINGS:  Severe diverticulosis was found in the sigmoid to     descending colon segments.   Retroflexed views in the rectum     revealed no abnormalities.    The scope was then withdrawn from     the patient and the procedure completed.           COMPLICATIONS:  None     ENDOSCOPIC IMPRESSION:     1) Severe diverticulosis in the sigmoid to descending colon     segments     NO CAUSE FOR BLEEDING NOTED.     RECOMMENDATIONS:     1) high fiber diet     REPEAT EXAM:  No           ______________________________     [name], MD, [facility]           CC:  Pricilla Riffle, MD           n.     Rosalie Doctor:   Vania Rea. Patterson at 09/20/2010 02:59 PM           Ernst Breach,  [id number]  Note: An exclamation mark (!) indicates a result that was not dispersed into the flowsheet. Document Creation Date: 09/20/2010 2:59 PM _______________________________________________________________________  (1) Order result status: Final Collection or observation date-time: 09/20/2010 14:51 Requested date-time:  Receipt date-time:  Reported date-time:  Referring Physician:   Ordering Physician: Sheryn Bison [phone]) Specimen Source:  Source: Launa Grill Order Number: [id number] Lab site:

## 2010-11-16 NOTE — Miscellaneous (Signed)
  Clinical Lists Changes  Observations: Added new observation of CT OF CHEST:    IMPRESSION:   Negative for pulmonary embolus or acute cardiopulmonary disease. (04/24/2010 11:06) Added new observation of ECHOINTERP:  Study Conclusions    - Left ventricle: Systolic function was normal. The estimated     ejection fraction was in the range of 55% to 60%. Doppler     parameters are consistent with abnormal left ventricular     relaxation (grade 1 diastolic dysfunction).   - Aortic valve: Mildly calcified annulus. Trileaflet; mildly     calcified leaflets.   - Mitral valve: Calcified annulus. Mild regurgitation.   - Left atrium: The atrium was mildly dilated.   - Tricuspid valve: Trivial regurgitation.   - Pericardium, extracardiac: There was no pericardial effusion. (04/24/2010 11:04) Added new observation of CXR RESULTS:    IMPRESSION:   No acute disease (04/23/2010 12:26)      Echocardiogram  Procedure date:  04/24/2010  Findings:       Study Conclusions    - Left ventricle: Systolic function was normal. The estimated     ejection fraction was in the range of 55% to 60%. Doppler     parameters are consistent with abnormal left ventricular     relaxation (grade 1 diastolic dysfunction).   - Aortic valve: Mildly calcified annulus. Trileaflet; mildly     calcified leaflets.   - Mitral valve: Calcified annulus. Mild regurgitation.   - Left atrium: The atrium was mildly dilated.   - Tricuspid valve: Trivial regurgitation.   - Pericardium, extracardiac: There was no pericardial effusion.  CT of Chest  Procedure date:  04/24/2010  Findings:         IMPRESSION:   Negative for pulmonary embolus or acute cardiopulmonary disease.  CXR  Procedure date:  04/23/2010  Findings:         IMPRESSION:   No acute disease

## 2010-11-16 NOTE — Procedures (Signed)
Summary: Esophageal Manometry/[place]  Esophageal Manometry/[place]   Imported By: Sherian Rein 08/30/2010 09:21:45  _____________________________________________________________________  External Attachment:    Type:   Image     Comment:   External Document

## 2010-11-16 NOTE — Assessment & Plan Note (Signed)
Summary: rectal bleeding,dysphagia/regina    History of Present Illness Visit Type: new patient  Primary GI MD: Sheryn Bison MD FACP FAGA Primary Provider: Burgess Amor Armeau-Claggett, PA  Requesting Provider: na Chief Complaint: Generalized abd pain, GERD, bloating, belching, BRB in stool and when patient wipes after BMs, hemorrhoids, nausea and vomiting, and rectal pain  History of Present Illness:   Extremely complicated and complex 77 year old Caucasian male with multiple medical problems including coronary artery disease, cardiac stenting, cardiac arrhythmias, asthmatic bronchitis, nephrolithiasis, 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 Also has a history of recurrent anal fissures and periodic rectal bleeding.  He currently describes periodic rectal bleeding with lower abdominal discomfort, increased reflux symptoms with associated chest pain, and he was in the emergency room for evaluation on November 1. Chest x-ray, EKG, and multiple labs were obtained and were normal and were reviewed today. He was advised to followup with his gastroenterologist. He sees Dr. Huston Foley in cardiology.  Review of recent labs showed normal CBC with a hemoglobin of 13 and normal metabolic profile. Review of his chart shows that his last endoscopic exams weren't 2007. His asthmatic bronchitis and COPD seem well controlled on inhalers. He is on multiple cardiac medications including Plavix 75 mg a day and daily aspirin. Was peripheral neuropathy associated with his back spondylosis he is on Gabapentin 100 mg a day, Vicodin 5 mg several times a day, and he uses p.r.n. Levsin for abdominal spasms. He has a history of multiple drug allergies.  Despite all this complaints he has had no anorexia, weight loss, nausea vomiting, or specific hepatobiliary complaints. Has a very positive family history  of colon cancer and 2 sons. He also gives a history of multiple neck and back surgeries, and several urologic procedures for kidney stones.he does not That smoke or abuse ethanol.   GI Review of Systems    Reports abdominal pain, acid reflux, belching, bloating, chest pain, dysphagia with solids, heartburn, loss of appetite, nausea, and  vomiting.     Location of  Abdominal pain: generalized.    Denies dysphagia with liquids, vomiting blood, weight loss, and  weight gain.      Reports diverticulosis, fecal incontinence, hemorrhoids, irritable bowel syndrome, rectal bleeding, and  rectal pain.     Denies anal fissure, black tarry stools, change in bowel habit, constipation, diarrhea, heme positive stool, jaundice, light color stool, and  liver problems.    Current Medications (verified): 1)  Ventolin Hfa 108 (90 Base) Mcg/act  Aers (Albuterol Sulfate) .Marland Kitchen.. 1-2 Puffs Every 4-6 Hours As Needed 2)  Flomax 0.4 Mg Xr24h-Cap (Tamsulosin Hcl) .... Once Daily 3)  Fexofenadine Hcl 180 Mg Tabs (Fexofenadine Hcl) .... Take 1 Tablet By Mouth Once A Day 4)  Furosemide 20 Mg Tabs (Furosemide) .... One Tablet By Mouth Once Daily 5)  Hyomax-Sl 0.125 Mg Subl (Hyoscyamine Sulfate) .Marland Kitchen.. 1 Every 8 Hrs 6)  Metoprolol Tartrate 50 Mg Tabs (Metoprolol Tartrate) .... One and 1/2 Tablet By Mouth Once Daily 7)  Nexium 40 Mg Cpdr (Esomeprazole Magnesium) .... Take 1 Tablet By Mouth Two Times A Day 8)  Plavix 75 Mg Tabs (Clopidogrel Bisulfate) .... Take 1 Tablet By Mouth Once A Day 9)  Bayer Aspirin 325 Mg Tabs (Aspirin) .... Take 1 Tablet By Mouth Once A Day 10)  Nitrostat 0.4 Mg Subl (Nitroglycerin) .... As Needed 11)  Imdur 60 Mg Xr24h-Tab (Isosorbide Mononitrate) .Marland KitchenMarland KitchenMarland Kitchen  Take 1 Tablet By Mouth Once A Day 12)  Singulair 10 Mg Tabs (Montelukast Sodium) .... Take 1 Tablet By Mouth Once A Day 13)  Caduet 5-40 Mg Tabs (Amlodipine-Atorvastatin) .Marland Kitchen.. 1 Tab Once Daily 14)  Flovent Hfa 220 Mcg/act Aero (Fluticasone Propionate   Hfa) .... As Directed 15)  Cardura 4 Mg Tabs (Doxazosin Mesylate) .Marland Kitchen.. 1 Tab Once Daily 16)  Gabapentin 100 Mg Caps (Gabapentin) .Marland Kitchen.. 1 Tab Once Daily 17)  Vicodin 5-500 Mg Tabs (Hydrocodone-Acetaminophen) .Marland Kitchen.. 1 Tab As Directed  Allergies (verified): 1)  ! Demerol 2)  ! Sulfa 3)  ! Zocor 4)  ! Symbicort (Budesonide-Formoterol Fumarate)  Past History:  Past medical, surgical, family and social histories (including risk factors) reviewed for relevance to current acute and chronic problems.  Past Medical History: COLONIC POLYPS, HX OF (ICD-V12.72) NEOPLASM, MALIGNANT, COLON, FAMILY HX (ICD-V16.0) LOSS OF APPETITE (ICD-783.0) RECTAL BLEEDING (ICD-569.3) ABDOMINAL PAIN, GENERALIZED (ICD-789.07) OTHER DYSPHAGIA (ICD-787.29) ARTHRITIS (ICD-716.90) RECTAL PAIN (ICD-569.42) IBS (ICD-564.1) NAUSEA AND VOMITING (ICD-787.01) ANAL FISSURE, HX OF (ICD-V13.3) PALPITATIONS, HX OF (ICD-V12.50) ALLERGIC RHINITIS, SEASONAL (ICD-477.0) HEMORRHOIDS, HX OF (ICD-V12.79) DIVERTICULOSIS, COLON, HX OF (ICD-V12.79) NEPHROLITHIASIS, HX OF (ICD-V13.01) MYOCARDIAL INFARCTION (ICD-410.90) * ANTERIOR CERVICAL DECOMPRESSION GAIT DISTURBANCE, HX OF (ICD-781.2) PERIPHERAL NEUROPATHY (ICD-356.9) LAMINECTOMY, HX OF (ICD-V45.89) DEPRESSION, HX OF (ICD-V11.8) BENIGN PROSTATIC HYPERTROPHY, HX OF (ICD-V13.8) ? of SLEEP APNEA (ICD-780.57) CAD, NATIVE VESSEL (ICD-414.01) HYPERLIPIDEMIA-MIXED (ICD-272.4) ESSENTIAL HYPERTENSION (ICD-401.9) DYSPNEA (ICD-786.05) ASTHMA, UNSPECIFIED, UNSPECIFIED STATUS (ICD-493.90) GERD (ICD-530.81)   Esophageal Stricture  Past Surgical History:  ANTERIOR CERVICAL DECOMPRESSION LAMINECTOMY, HX OF (ICD-V45.89) Stent Surgery Multiple Neck Surgery Multiple Back Surgery  Family History: Reviewed history from 10/30/2008 and no changes required. Father with hx diabetes, asthma, heart disease Mother died of CAD, hx CABG age 44 Family History of Colon Cancer: Oldest Son Family  History of Colitis/Crohn's: Middle Son   Social History: Reviewed history from 10/30/2008 and no changes required. No EtOH Patient never smoked.  Pt is widowed with children. 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   Daily Caffeine Use: 2 daily  Illicit Drug Use - no Drug Use:  no  Review of Systems       The patient complains of allergy/sinus, arthritis/joint pain, blood in urine, confusion, cough, heart rhythm changes, muscle pains/cramps, shortness of breath, swelling of feet/legs, and voice change.  The patient denies anemia, anxiety-new, back pain, breast changes/lumps, change in vision, coughing up blood, depression-new, fainting, fatigue, fever, headaches-new, hearing problems, heart murmur, itching, night sweats, nosebleeds, skin rash, sleeping problems, sore throat, swollen lymph glands, thirst - excessive, urination - excessive, urination changes/pain, urine leakage, and vision changes.   General:  Complains of fatigue and malaise; denies fever, chills, sweats, anorexia, weakness, weight loss, and sleep disorder. CV:  Complains of chest pains, angina, palpitations, and peripheral edema; denies syncope, dyspnea on exertion, orthopnea, PND, and claudication. Resp:  Complains of dyspnea with exercise, cough, and wheezing; denies dyspnea at rest, sputum, coughing up blood, and pleurisy. GU:  Denies urinary burning, blood in urine, urinary frequency, urinary hesitancy, nocturnal urination, urinary incontinence, penile discharge, genital sores, decreased libido, and erectile dysfunction. MS:  Complains of low back pain and muscle cramps; denies joint pain / LOM, joint swelling, joint stiffness, joint deformity, muscle weakness, muscle atrophy, leg pain at night, leg pain with exertion, and shoulder pain / LOM hand / wrist pain (CTS). Derm:  Complains of itching; denies rash, dry skin, hives, moles, warts, and unhealing ulcers. Neuro:  Complains of abnormal sensation and  radiculopathy other:; denies weakness, paralysis, seizures, syncope, tremors, vertigo, transient blindness, frequent falls, frequent headaches, difficulty walking, headache, sciatica, restless legs, memory loss, and confusion. Psych:  Complains of confusion; denies depression, anxiety, memory loss, suicidal ideation, hallucinations, paranoia, and phobia. Heme:  Complains of bruising; denies bleeding, enlarged lymph nodes, and pagophagia. Allergy:  Complains of hay fever; denies hives, rash, sneezing, and recurrent infections.  Vital Signs:  Patient profile:   77 year old male Height:      70 inches Weight:      177 pounds BMI:     25.49 BSA:     1.98 Pulse rate:   88 / minute Pulse rhythm:   regular BP sitting:   136 / 80  (left arm) Cuff size:   regular  Vitals Entered By: Ok Anis CMA (August 26, 2010 9:24 AM)  Physical Exam  General:  Well developed, well nourished, no acute distress. Head:  Normocephalic and atraumatic. Eyes:  PERRLA, no icterus.exam deferred to patient's ophthalmologist.   Neck:  Supple; no masses or thyromegaly. Lungs:  decreased BS on L and decreased BS on R.   Heart:  Regular rate and rhythm; no murmurs, rubs,  or bruits.Regular pulse at this time. Abdomen:  Soft, nontender and nondistended. No masses, hepatosplenomegaly or hernias noted. Normal bowel sounds. Rectal:  Normal exam.hemocult negative.   Prostate:  .normal size prostate.   Msk:  Symmetrical with no gross deformities. Normal posture.arthritic changes.   Extremities:  No clubbing, cyanosis, edema or deformities noted.trace pedal edema.   Neurologic:  Alert and  oriented x4;  grossly normal neurologically. Cervical Nodes:  No significant cervical adenopathy. Psych:  Alert and cooperative. Normal mood and affect.   Impression & Recommendations:  Problem # 1:  RECTAL BLEEDING (ICD-569.3) Assessment Unchanged Probable bleeding from internal hemorrhoids---rule out colonic polyposis,  segmental colitis, colon carcinoma. He has a very strong family history of colon cancer in first-degree relatives. His symptomatology could be an atypical manifestation of chronic low-grade colonic ischemia. Colonoscopy has been scheduled along with laboratory evaluation. I've urged him to continue a high fiber diet and fiber supplements as needed for now. We will hold his Plavix 5 days before his colonoscopy procedure less otherwise advised by cardiology. Orders: TLB-B12, Serum-Total ONLY (16109-U04) TLB-Ferritin (82728-FER) TLB-Folic Acid (Folate) (82746-FOL) TLB-Iron, (Fe) Total (83540-FE) TLB-IBC Pnl (Iron/FE;Transferrin) (83550-IBC) TLB-Magnesium (Mg) (83735-MG) T- * Misc. Laboratory test [phone]) Colon/Endo (Colon/Endo)  Problem # 2:  NEOPLASM, MALIGNANT, COLON, FAMILY HX (ICD-V16.0) Assessment: Unchanged  Problem # 3:  ABDOMINAL PAIN, GENERALIZED (ICD-789.07) Assessment: Unchanged repeat endoscopy also schedule with examination for H. pylori. He is to continue Nexium daily. His current chest pain may be from worsening acid reflux, and he does have a history of recurrent esophageal strictures. I have added Carafate suspension on a p.r.n. basis to his reflux regime  Problem # 4:  ARTHRITIS (ICD-716.90) Assessment: Unchanged  Problem # 5:  PERIPHERAL NEUROPATHY (ICD-356.9) Assessment: Deteriorated  Problem # 6:  ASTHMA, UNSPECIFIED, UNSPECIFIED STATUS (ICD-493.90) Assessment: Deteriorated Chronic COPD management with medications and p.r.n. inhalers. He does not appear to have any pulmonary compromise or evidence of congestive heart failure at this time.  Patient Instructions: 1)  Get your labs drawn today in the basement.  2)  Prescription was sent to the pharmacy along with the prep for colonoscopy.  3)  Colonoscopy and Flexible Sigmoidoscopy brochure given.  4)  Upper Endoscopy brochure given.  5)  Copy sent to : Burgess Amor Armeau-Claggett, PA 6)  The medication list was reviewed  and reconciled.  All changed / newly prescribed medications were explained.  A complete medication list was provided to the patient / caregiver. 7)  Copy Dr. Dietrich Pates in cardiology 8)  Please continue current medications.  9)  Avoid foods high in acid content ( tomatoes, citrus juices, spicy foods) . Avoid eating within 3 to 4 hours of lying down or before exercising. Do not over eat; try smaller more frequent meals. Elevate head of bed four inches when sleeping.  10)  Diet should be high in fiber ( fruits, vegetables, whole grains) but low in residue. Drink at least eight (8) glasses of water a day.  Prescriptions: MOVIPREP 100 GM  SOLR (PEG-KCL-NACL-NASULF-NA ASC-C) As per prep instructions.  #1 x 0   Entered by:   Christie Nottingham CMA (AAMA)   Authorized by:   Mardella Layman MD [facility]   Signed by:   Mardella Layman MD [facility] on 08/26/2010   Method used:   Electronically to        [facility] (retail)       [address]       [place], [place]  [address]       Ph: [phone]       Fax: [phone]   RxID:   [id number] CARAFATE 1 GM/10ML SUSP (SUCRALFATE) drink slurry 5 cc every 6-8 hours as needed  #59mL x 1   Entered by:   Christie Nottingham CMA (AAMA)   Authorized by:   Mardella Layman MD [facility]   Signed by:   Mardella Layman MD [facility] on 08/26/2010   Method used:   Electronically to        [facility] (retail)       [address]       [place], [place]  [address]       Ph: [phone]       Fax: [phone]   RxID:   [id number]

## 2010-11-16 NOTE — Procedures (Signed)
Summary: Endoscopy   EGD  Procedure date:  01/26/2007  Findings:      Location: [facility]   Patient Name: Bawi, Lakins. MRN:  Procedure Procedures: Panendoscopy (EGD) CPT: 43235.    with esophageal dilation. CPT: G9296129.  Personnel: Endoscopist: [name], MD.  Exam Location: Exam performed in Outpatient Clinic. Outpatient  Patient Consent: Procedure, Alternatives, Risks and Benefits discussed, consent obtained, from patient. Consent was obtained by the RN.  Indications Symptoms: Dysphagia.  History  Current Medications: Patient is taking a non-steroidal medication. Patient is not currently taking Coumadin. Antiplatelet: Plavix (Clopidrogel) 75 mg QAM, started Jan 26, 2007.  Medical/Surgical History: Coronary Artery Disease, stents. Asthma,  Pre-Exam Physical: Performed Jan 26, 2007  Cardio-pulmonary exam, Abdominal exam, Extremity exam, Mental status exam WNL.  Comments: Pt. history reviewed/updated, physical exam performed prior to initiation of sedation? yes Exam Exam Info: Maximum depth of insertion Duodenum, intended Duodenum. Patient position: on left side. Duration of exam: 10 minutes. Vocal cords visualized. Gastric retroflexion performed. Images taken. ASA Classification: II. Tolerance: excellent.  Sedation Meds: Patient assessed and found to be appropriate for moderate (conscious) sedation. Fentanyl 50 mcg. given IV. Versed 5 mg. given IV. Cetacaine Spray 2 sprays given aerosolized.  Monitoring: BP and pulse monitoring done. Oximetry used. Supplemental O2 given at 2 Liters.  Instrument(s): GIF 160. Serial [id number].   Findings - OTHER FINDING: in Proximal Esophagus. Comments: Candida plaques...  - HIATAL HERNIA: Prolapsing, 6 cms. in length. ICD9: Hernia, Hiatal: 553.3. - STRICTURE / STENOSIS: Distal Esophagus.  Constriction: partial. Lumen diameter is 14 mm. ICD9: Esophageal Stricture: 530.3.  - Dilation: Distal  Esophagus. Maloney dilator used, Diameter: 58 F, Moderate Resistance, No Heme present on extraction. 1  total dilators used. Patient tolerance excellent. Outcome: successful.  - Normal: Fundus to Duodenal 2nd Portion. Not Seen: Tumor. Ulcer. Mucosal abnormality. AVM's. Foreign body. Polyp. Varices.    Comments: Plaques also on his vocal cords... Assessment Incomplete examination.  Diagnoses: 530.3: Esophageal Stricture.  553.3: Hernia, Hiatal. Chronic GERD.   Comments: Recurrent Candida esophagitis.Marland KitchenMarland KitchenR/O immune disorder.. Events  Unplanned Intervention: No unplanned interventions were required.  Plans Medication(s): Continue current medications. Other: Diflucan 100mg  QAM, starting Jan 26, 2007 for 2 wks.   Disposition: After procedure patient sent to recovery. After recovery patient sent home.  Scheduling: Await pathology to schedule patient.  Comments: Will check HIV titer...   CC: Dietrich Pates, MD  This report was created from the original endoscopy report, which was reviewed and signed by the above listed endoscopist.

## 2010-11-16 NOTE — Progress Notes (Signed)
Summary: nexium rf  Phone Note Refill Request Message from:  Pharmacy [facility] [phone]  Refills Requested: Medication #1:  NEXIUM 40 MG CPDR Take 1 tablet by mouth two times a day last seen January 18 2009, no appt scheduled--needs office visit  Initial call taken by: Kandice Hams CMA,  April 09, 2010 4:48 PM

## 2010-11-16 NOTE — Progress Notes (Signed)
Summary: MD Switch   Phone Note Outgoing Call   Summary of Call: Carlyon Shadow RN for Dr. Tenny Craw calling.  Pt wants to switch from Dr. Jarold Motto to Dr. Russella Dar for GI follow up.  Repeat colon due 2012. Initial call taken by: Ashok Cordia RN,  November 23, 2009 4:44 PM  Follow-up for Phone Call        FINE Follow-up by: Mardella Layman MD Clementeen Graham,  November 24, 2009 8:21 AM  Additional Follow-up for Phone Call Additional follow up Details #1::        OK with me. Additional Follow-up by: Meryl Dare MD Clementeen Graham,  November 24, 2009 8:48 AM    Additional Follow-up for Phone Call Additional follow up Details #2::    Both Doctors agree with switch.   Follow-up by: Ashok Cordia RN,  November 24, 2009 9:06 AM  Additional Follow-up for Phone Call Additional follow up Details #3:: Details for Additional Follow-up Action Taken: Called patient... he is aware that Dr.Stark will follow his case and will call for appointment because he is having current GI issues. Additional Follow-up by: Suzan Garibaldi RN

## 2010-11-16 NOTE — Assessment & Plan Note (Signed)
Summary: eph  Medications Added METOPROLOL TARTRATE 50 MG TABS (METOPROLOL TARTRATE) 1/2 tablet by mouth 2 times per day      Allergies Added:   Visit Type:  eph Referring Provider:  na Primary Provider:  Burgess Amor Armeau-Claggett, PA   CC:  fatigued.  History of Present Illness: Mr. Allemand is a 77 year old male with a history of coronary artery   disease.  He is status post previous percutaneous intervention on the   RCA and the diagonal branch several years ago.  He has had a history of   chronic chest pain with multiple recall recent catheterizations  both to   December of 2010,  December of 2009 and August of 2008 which showed   patent revascularization with only nonobstructive disease.  He also has   a history of asthma, hypertension, hyperlipidemia, chronic dyspnea,   gastroesophageal reflux disease and back pain.      In reviewing his records he most recently was admitted in July of 2011   with recurrent chest pain.  Workup was negative.  He was discharged home   with follow-up.  The patient was just seen in the [facility] ER on 11/1 with dizziness and some mild chest pain.  He was found to be orthostatic.  CP was atypical  He was sent home. Since D?C he constinues to note some tightness intermittently.  Today he said that it got better after eatting a few crackers.  He still notes occasional dizziness.  No episodes of PND.  No episdoes of syncope.  He claims he is eatting regulary and getting adequate fluids. Note while in the ER he was found to have a lot of PVCs  He has worn a holter monitor to evaluate.  Current Medications (verified): 1)  Ventolin Hfa 108 (90 Base) Mcg/act  Aers (Albuterol Sulfate) .Marland Kitchen.. 1-2 Puffs Every 4-6 Hours As Needed 2)  Flomax 0.4 Mg Xr24h-Cap (Tamsulosin Hcl) .... Once Daily 3)  Fexofenadine Hcl 180 Mg Tabs (Fexofenadine Hcl) .... Take 1 Tablet By Mouth Once A Day 4)  Furosemide 20 Mg Tabs (Furosemide) .... One Tablet By Mouth Once Daily 5)   Hyomax-Sl 0.125 Mg Subl (Hyoscyamine Sulfate) .Marland Kitchen.. 1 Every 8 Hrs 6)  Metoprolol Tartrate 50 Mg Tabs (Metoprolol Tartrate) .... One and 1/2 Tablet By Mouth Once Daily 7)  Nexium 40 Mg Cpdr (Esomeprazole Magnesium) .... Take 1 Tablet By Mouth Two Times A Day 8)  Plavix 75 Mg Tabs (Clopidogrel Bisulfate) .... Take 1 Tablet By Mouth Once A Day 9)  Bayer Aspirin 325 Mg Tabs (Aspirin) .... Take 1 Tablet By Mouth Once A Day 10)  Nitrostat 0.4 Mg Subl (Nitroglycerin) .... As Needed 11)  Imdur 60 Mg Xr24h-Tab (Isosorbide Mononitrate) .... Take 1 Tablet By Mouth Once A Day 12)  Singulair 10 Mg Tabs (Montelukast Sodium) .... Take 1 Tablet By Mouth Once A Day 13)  Caduet 5-40 Mg Tabs (Amlodipine-Atorvastatin) .Marland Kitchen.. 1 Tab Once Daily 14)  Flovent Hfa 220 Mcg/act Aero (Fluticasone Propionate  Hfa) .... As Directed 15)  Cardura 4 Mg Tabs (Doxazosin Mesylate) .Marland Kitchen.. 1 Tab Once Daily 16)  Gabapentin 100 Mg Caps (Gabapentin) .Marland Kitchen.. 1 Tab Once Daily 17)  Vicodin 5-500 Mg Tabs (Hydrocodone-Acetaminophen) .Marland Kitchen.. 1 Tab As Directed 18)  Carafate 1 Gm/81ml Susp (Sucralfate) .... Drink Slurry 5 Cc Every 6-8 Hours As Needed 19)  Moviprep 100 Gm  Solr (Peg-Kcl-Nacl-Nasulf-Na Asc-C) .... As Per Prep Instructions.  Allergies (verified): 1)  ! Demerol 2)  ! Sulfa 3)  !  Zocor 4)  ! Symbicort (Budesonide-Formoterol Fumarate)  Past History:  Past medical, surgical, family and social histories (including risk factors) reviewed, and no changes noted (except as noted below).  Past Medical History: Reviewed history from 08/26/2010 and no changes required. COLONIC POLYPS, HX OF (ICD-V12.72) NEOPLASM, MALIGNANT, COLON, FAMILY HX (ICD-V16.0) LOSS OF APPETITE (ICD-783.0) RECTAL BLEEDING (ICD-569.3) ABDOMINAL PAIN, GENERALIZED (ICD-789.07) OTHER DYSPHAGIA (ICD-787.29) ARTHRITIS (ICD-716.90) RECTAL PAIN (ICD-569.42) IBS (ICD-564.1) NAUSEA AND VOMITING (ICD-787.01) ANAL FISSURE, HX OF (ICD-V13.3) PALPITATIONS, HX OF  (ICD-V12.50) ALLERGIC RHINITIS, SEASONAL (ICD-477.0) HEMORRHOIDS, HX OF (ICD-V12.79) DIVERTICULOSIS, COLON, HX OF (ICD-V12.79) NEPHROLITHIASIS, HX OF (ICD-V13.01) MYOCARDIAL INFARCTION (ICD-410.90) * ANTERIOR CERVICAL DECOMPRESSION GAIT DISTURBANCE, HX OF (ICD-781.2) PERIPHERAL NEUROPATHY (ICD-356.9) LAMINECTOMY, HX OF (ICD-V45.89) DEPRESSION, HX OF (ICD-V11.8) BENIGN PROSTATIC HYPERTROPHY, HX OF (ICD-V13.8) ? of SLEEP APNEA (ICD-780.57) CAD, NATIVE VESSEL (ICD-414.01) HYPERLIPIDEMIA-MIXED (ICD-272.4) ESSENTIAL HYPERTENSION (ICD-401.9) DYSPNEA (ICD-786.05) ASTHMA, UNSPECIFIED, UNSPECIFIED STATUS (ICD-493.90) GERD (ICD-530.81)   Esophageal Stricture  Past Surgical History: Reviewed history from 08/26/2010 and no changes required.  ANTERIOR CERVICAL DECOMPRESSION LAMINECTOMY, HX OF (ICD-V45.89) Stent Surgery Multiple Neck Surgery Multiple Back Surgery  Family History: Reviewed history from 08/26/2010 and no changes required. Father with hx diabetes, asthma, heart disease Mother died of CAD, hx CABG age 73 Family History of Colon Cancer: Oldest Son Family History of Colitis/Crohn's: Middle Son   Social History: Reviewed history from 08/26/2010 and no changes required. No EtOH Patient never smoked.  Pt is widowed with children. 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   Daily Caffeine Use: 2 daily  Illicit Drug Use - no  Review of Systems       ALl systems reviewed   Neg to the above problem except as noted.  Vital Signs:  Patient profile:   77 year old male Height:      70 inches Weight:      179 pounds BMI:     25.78 Pulse rate:   62 / minute Pulse (ortho):   62 / minute BP sitting:   132 / 68  (left arm) BP standing:   90 / 64 Cuff size:   regular  Vitals Entered By: Caralee Ates CMA (August 30, 2010 12:20 PM)  Serial Vital Signs/Assessments:  Time      Position  BP       Pulse  Resp  Temp     By           Lying LA  120/70   58                     Layne Benton, RN, BSN           Sitting   100/64   58                    Layne Benton, RN, BSN           Standing  90/64    62                    Layne Benton, Charity fundraiser, BSN  Comments: standing at 3 minutes BP 90/62  apical 60 By: Layne Benton, RN, BSN    Physical Exam  Additional Exam:  Patient is in NAD HEENT:  Normocephalic, atraumatic. EOMI, PERRLA.  Neck: JVP is normal. No thyromegaly. No bruits.  Lungs: clear to auscultation. No rales no wheezes.  Heart: Regular rate and rhythm. Normal S1, S2. No S3.   No significant murmurs. PMI not displaced.  Abdomen:  Supple, nontender. Normal bowel sounds. No masses. No hepatomegaly.  Extremities:   Good distal pulses throughout. No lower extremity edema.  Musculoskeletal :moving all extremities.  Neuro:   alert and oriented x3.    EKG  Procedure date:  08/30/2010  Findings:      NSR.  62 bpm.  Occasional PVCs.  Impression & Recommendations:  Problem # 1:  ORTHOSTATIC DIZZINESS (ICD-780.4) Patient is orthostatic on exam.  I do not think it is related to the PVCs.  I would check baseline labs including a UA to evaluate S.G. I have encouraged him to increase his fluid and salt intake.  I would cut back on Metorprolo and stopt caduet.  Follow response.   SOme of dizziness may explain his chest pain symptoms.  Orders: TLB-BMP (Basic Metabolic Panel-BMET) (80048-METABOL) TLB-TSH (Thyroid Stimulating Hormone) (84443-TSH) TLB-Cortisol (82533-CORT) TLB-Udip ONLY (81003-UDIP)  Problem # 2:  CAD, NATIVE VESSEL (ICD-414.01) I am not convinced of active angina.   Would follow BP first. His updated medication list for this problem includes:    Metoprolol Tartrate 50 Mg Tabs (Metoprolol tartrate) .Marland Kitchen... 1/2 tablet by mouth 2 times per day    Plavix 75 Mg Tabs (Clopidogrel bisulfate) .Marland Kitchen... Take 1 tablet by mouth once a day    Bayer Aspirin 325 Mg Tabs (Aspirin) .Marland Kitchen... Take 1 tablet by mouth once a day    Nitrostat 0.4 Mg  Subl (Nitroglycerin) .Marland Kitchen... As needed    Imdur 60 Mg Xr24h-tab (Isosorbide mononitrate) .Marland Kitchen... Take 1 tablet by mouth once a day  Problem # 3:  PALPITATIONS, HX OF (ICD-V12.50) Holter monitor with  SR  rates 55 to 105 bpm.  Average 69 bpm.  No pauses.  Noted frequent PVCs noted (17% total)  Occasional couplets (266).  Symptoms did not correlate with signif rhythm problems.  Follow for now.  Continue b blocker.  Patient Instructions: 1)  Your physician has recommended you make the following change in your medication: STOP Caduet 2)  Decrease Metoprolol to one half 2 times per day 3)  Your physician recommends that you return for lab work in: lab work today..we will call you with results on Wednesday. 4)  Your physician recommends that you schedule a follow-up appointment in: 3 weeks with Dr.Ross

## 2010-11-16 NOTE — Letter (Signed)
Summary: New Patient letter  [facility]  [address] [place], [place] [address]   Phone: [phone]  Fax: [phone]       07/27/2010 MRN: [id number]  [name] [address] [place], [place]  [address]  Dear Mr. CASALINO,  Welcome to the Gastroenterology Division at [facility].    You are scheduled to see Dr.  Jarold Motto on 08/26/2010  at 945 A.M. on the 3rd floor at [facility], [address].  We ask that you try to arrive at our office 15 minutes prior to your appointment time to allow for check-in.  We would like you to complete the enclosed self-administered evaluation form prior to your visit and bring it with you on the day of your appointment.  We will review it with you.  Also, please bring a complete list of all your medications or, if you prefer, bring the medication bottles and we will list them.  Please bring your insurance card so that we may make a copy of it.  If your insurance requires a referral to see a specialist, please bring your referral form from your primary care physician.  Co-payments are due at the time of your visit and may be paid by cash, check or credit card.     Your office visit will consist of a consult with your physician (includes a physical exam), any laboratory testing he/she may order, scheduling of any necessary diagnostic testing (e.g. x-ray, ultrasound, CT-scan), and scheduling of a procedure (e.g. Endoscopy, Colonoscopy) if required.  Please allow enough time on your schedule to allow for any/all of these possibilities.    If you cannot keep your appointment, please call [phone] to cancel or reschedule prior to your appointment date.  This allows [place] the opportunity to schedule an appointment for another patient in need of care.  If you do not cancel or reschedule by 5 p.m. the business day prior to your appointment date, you will be charged a $50.00 late cancellation/no-show fee.    Thank you for  choosing [facility] for your medical needs.  We appreciate the opportunity to care for you.  Please visit [place] at our website  to learn more about our practice.                     Sincerely,                                                             The Gastroenterology Division

## 2010-11-16 NOTE — Miscellaneous (Signed)
  Clinical Lists Changes  Observations: Added new observation of CXR RESULTS:  Clinical Data: Chest pain and shortness of breath.    PORTABLE CHEST - 1 VIEW    Comparison: CT chest 04/24/2010 plain films of the chest 04/23/2010    Findings: The lungs are clear.  Heart size is normal.  No pleural   effusion or pneumothorax.  No focal bony abnormality.    IMPRESSION:   Negative chest.    Read By:  Charyl Dancer,  M.D. (08/17/2010 15:45)      CXR  Procedure date:  08/17/2010  Findings:       Clinical Data: Chest pain and shortness of breath.    PORTABLE CHEST - 1 VIEW    Comparison: CT chest 04/24/2010 plain films of the chest 04/23/2010    Findings: The lungs are clear.  Heart size is normal.  No pleural   effusion or pneumothorax.  No focal bony abnormality.    IMPRESSION:   Negative chest.    Read By:  Charyl Dancer,  M.D.

## 2010-11-16 NOTE — Progress Notes (Signed)
Summary: triage   Phone Note Call from Patient Call back at Home Phone [phone]   Caller: Patient Call For: Dr. Russella Dar Reason for Call: Talk to Nurse Summary of Call: pt having abd pain and rectal bleeding... ot comfortable with waiting until December to be seen Initial call taken by: Vallarie Mare,  July 27, 2010 2:00 PM  Follow-up for Phone Call        Patient  called wanting to be scheduled for appointment before December. Patient  c/o rectal bleeding x 1 year. Scheduled patient for visit with Dr. Jarold Motto on 08/26/10 at 9:45 A.M. Jesse Fall, RN Follow-up by: Darcey Nora RN, CGRN,  July 27, 2010 4:07 PM

## 2010-11-16 NOTE — Procedures (Signed)
Summary: Colonoscopy   Colonoscopy  Procedure date:  10/07/2004  Findings:      Location:  [facility].   Patient Name: Elmer, Merwin. MRN:  Procedure Procedures: Colonoscopy CPT: [phone].  Personnel: Endoscopist: [name], MD.  Exam Location: Exam performed in Outpatient Clinic. Outpatient  Patient Consent: Procedure, Alternatives, Risks and Benefits discussed, consent obtained, from patient. Consent was obtained by the RN.  Indications Symptoms: Hematochezia.  Comments: Rectal incontinence. History  Current Medications: Patient is not currently taking Coumadin.  Pre-Exam Physical: Performed Oct 07, 2004. Cardio-pulmonary exam, Rectal exam, Abdominal exam, Extremity exam, Mental status exam WNL.  Exam Exam: Extent of exam reached: Cecum, extent intended: Cecum.  The cecum was identified by appendiceal orifice and IC valve. Patient position: on left side. Duration of exam: 20 minutes. Colon retroflexion performed. Images taken. ASA Classification: II. Tolerance: excellent.  Monitoring: Pulse and BP monitoring, Oximetry used. Supplemental O2 given. at 2 Liters.  Colon Prep Used Golytely for colon prep. Prep results: excellent.  Sedation Meds: Patient assessed and found to be appropriate for moderate (conscious) sedation. Fentanyl 50 mcg. given IV. Versed 5 mg. given IV.  Instrument(s): CF 140L. Serial [id number].  Findings - DIVERTICULOSIS: Descending Colon to Sigmoid Colon. Not bleeding. ICD9: Diverticulosis, Colon: 562.10.  - NORMAL EXAM: Cecum to Rectum. Not Seen: Polyps. AVM's. Colitis. Tumors. Melanosis. Crohn's. Diverticulosis. Hemorrhoids.  - HEMORRHOIDS: Internal and External. Size: Medium. Not bleeding. Not thrombosed. ICD9: Hemorrhoids, Internal and  External: 455.6.   Assessment  Diagnoses: 562.10: Diverticulosis, Colon.  455.6: Hemorrhoids, Internal and  External.   Comments: No cause for rectal  incontinence. Events  Unplanned Interventions: No intervention was required.  Plans Medication Plan: Continue current medications.  Patient Education: Patient given standard instructions for: Diverticulosis.  Disposition: After procedure patient sent to recovery. After recovery patient sent home.  Comments: Refer to GI at [facility] for anorectal manometry.   CC: Dietrich Pates, MD

## 2010-11-16 NOTE — Assessment & Plan Note (Signed)
Summary: eph  Medications Added FUROSEMIDE 40 MG TABS (FUROSEMIDE) Take one tablet by mouth daily. METOPROLOL TARTRATE 25 MG TABS (METOPROLOL TARTRATE) Take one tablet by mouth twice a day CADUET 5-40 MG TABS (AMLODIPINE-ATORVASTATIN) 1 tab once daily * FLOVENT INHALER as directed CARDURA 4 MG TABS (DOXAZOSIN MESYLATE) 1 tab once daily GABAPENTIN 100 MG CAPS (GABAPENTIN) 1 tab once daily VICODIN 5-500 MG TABS (HYDROCODONE-ACETAMINOPHEN) 1 tab as directed      Allergies Added:   Visit Type:  Follow-up Referring Provider:  Dr Dietrich Pates Primary Provider:  Dr Claretta Fraise  CC:  no complaints.  History of Present Illness: [name] is a 57 saw him in Feb of this year.  He has a  history of CAD.( last catheterization showed a 50% LAD lesion; 70% ostium of D1; circumflex with irregularities;  RCA  with patent stent). He was just hospitalized at [facility] for chest pains.  He said the pain started in his L back  Radiated to L arm, L chest.  It was pleuritic.  Underwent CT chast that was normal.  Enzymes were neg.  Felt to be musculoskel and he was d/c'd.   Since d/c he denies chet pain.  Still SOB.  Ran out of singular.  did better on it.    Current Medications (verified): 1)  Ventolin Hfa 108 (90 Base) Mcg/act  Aers (Albuterol Sulfate) .Marland Kitchen.. 1-2 Puffs Every 4-6 Hours As Needed 2)  Flomax 0.4 Mg Xr24h-Cap (Tamsulosin Hcl) .... Once Daily 3)  Fexofenadine Hcl 180 Mg Tabs (Fexofenadine Hcl) .... Take 1 Tablet By Mouth Once A Day 4)  Furosemide 40 Mg Tabs (Furosemide) .... Take One Tablet By Mouth Daily. 5)  Hyomax-Sl 0.125 Mg Subl (Hyoscyamine Sulfate) .Marland Kitchen.. 1 Every 8 Hrs 6)  Metoprolol Tartrate 25 Mg Tabs (Metoprolol Tartrate) .... Take One Tablet By Mouth Twice A Day 7)  Nexium 40 Mg Cpdr (Esomeprazole Magnesium) .... Take 1 Tablet By Mouth Two Times A Day 8)  Plavix 75 Mg Tabs (Clopidogrel Bisulfate) .... Take 1 Tablet By Mouth Once A Day 9)  Bayer Aspirin 325 Mg Tabs (Aspirin) ....  Take 1 Tablet By Mouth Once A Day 10)  Nitrostat 0.4 Mg Subl (Nitroglycerin) .... As Needed 11)  Imdur 60 Mg Xr24h-Tab (Isosorbide Mononitrate) .... Take 1 Tablet By Mouth Once A Day 12)  Singulair 10 Mg Tabs (Montelukast Sodium) .... Take 1 Tablet By Mouth Once A Day 13)  Caduet 5-40 Mg Tabs (Amlodipine-Atorvastatin) .Marland Kitchen.. 1 Tab Once Daily 14)  Flovent Inhaler .... As Directed 15)  Cardura 4 Mg Tabs (Doxazosin Mesylate) .Marland Kitchen.. 1 Tab Once Daily 16)  Gabapentin 100 Mg Caps (Gabapentin) .Marland Kitchen.. 1 Tab Once Daily 17)  Vicodin 5-500 Mg Tabs (Hydrocodone-Acetaminophen) .Marland Kitchen.. 1 Tab As Directed  Allergies (verified): 1)  ! Demerol 2)  ! Sulfa 3)  ! Zocor 4)  ! Symbicort (Budesonide-Formoterol Fumarate)  Past History:  Past medical, surgical, family and social histories (including risk factors) reviewed, and no changes noted (except as noted below).  Past Medical History: Reviewed history from 05/01/2009 and no changes required. ALLERGIC RHINITIS, SEASONAL (ICD-477.0) HEMORRHOIDS, HX OF (ICD-V12.79) DIVERTICULOSIS, COLON, HX OF (ICD-V12.79) NEPHROLITHIASIS, HX OF (ICD-V13.01) MYOCARDIAL INFARCTION (ICD-410.90) GAIT DISTURBANCE, HX OF (ICD-781.2) PERIPHERAL NEUROPATHY (ICD-356.9) DEPRESSION, HX OF (ICD-V11.8) BENIGN PROSTATIC HYPERTROPHY, HX OF (ICD-V13.8) ? of SLEEP APNEA (ICD-780.57) CAD, NATIVE VESSEL (ICD-414.01) HYPERLIPIDEMIA-MIXED (ICD-272.4) ESSENTIAL HYPERTENSION (ICD-401.9) DYSPNEA (ICD-786.05) ASTHMA, UNSPECIFIED, UNSPECIFIED STATUS (ICD-493.90) GERD (ICD-530.81)    Past Surgical History: Reviewed history from 05/01/2009  and no changes required. * ANTERIOR CERVICAL DECOMPRESSION LAMINECTOMY, HX OF (ICD-V45.89)  Family History: Reviewed history from 10/30/2008 and no changes required. Father with hx diabetes, asthma, heart disease Mother died of CAD, hx CABG age 19  Social History: Reviewed history from 10/30/2008 and no changes required. No EtOH Patient never smoked.    Pt is widowed with children. 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    Vital Signs:  Patient profile:   77 year old male Height:      70 inches Weight:      181 pounds BMI:     26.06 Pulse rate:   76 / minute BP sitting:   134 / 80  (left arm)  Vitals Entered By: Burnett Kanaris, CNA (May 07, 2010 12:13 PM)  Physical Exam  Additional Exam:  Patient is in NAD HEENT:  Normocephalic, atraumatic. EOMI, PERRLA.  Neck: JVP is normal. No thyromegaly. No bruits.  Lungs: clear to auscultation. No rales no wheezes.  Heart: Regular rate and rhythm. Normal S1, S2. No S3.   No significant murmurs. PMI not displaced.  Abdomen:  Supple, nontender. Normal bowel sounds. No masses. No hepatomegaly.  Extremities:   Good distal pulses throughout. No lower extremity edema.  Musculoskeletal :moving all extremities.  Neuro:   alert and oriented x3.    EKG  Procedure date:  05/07/2010  Findings:      NSR.  65 bpm.  Occasional PVC.  Impression & Recommendations:  Problem # 1:  CAD, NATIVE VESSEL (ICD-414.01) No sxs of active angina.  Pain that he recently had sounds musculoskel.  Resolved.  I would keep on same regimen.  Problem # 2:  HYPERLIPIDEMIA-MIXED (ICD-272.4) Will need to review recent labs.  COntinue for now. His updated medication list for this problem includes:    Caduet 5-40 Mg Tabs (Amlodipine-atorvastatin) .Marland Kitchen... 1 tab once daily  Problem # 3:  ESSENTIAL HYPERTENSION (ICD-401.9) Good control.  Problem # 4:  ASTHMA, UNSPECIFIED, UNSPECIFIED STATUS (ICD-493.90) Mild  Refilled singular.  Does not feel he needs to see R. Byrum. His updated medication list for this problem includes:    Ventolin Hfa 108 (90 Base) Mcg/act Aers (Albuterol sulfate) .Marland Kitchen... 1-2 puffs every 4-6 hours as needed    Singulair 10 Mg Tabs (Montelukast sodium) .Marland Kitchen... Take 1 tablet by mouth once a day  Other Orders: EKG w/ Interpretation (93000)  Patient Instructions: 1)  Your physician  wants you to follow-up in: 6 months  You will receive a reminder letter in the mail two months in advance. If you don't receive a letter, please call our office to schedule the follow-up appointment. Prescriptions: SINGULAIR 10 MG TABS (MONTELUKAST SODIUM) Take 1 tablet by mouth once a day  #30 x 6   Entered by:   Layne Benton, RN, BSN   Authorized by:   Sherrill Raring, MD, [facility]   Signed by:   Layne Benton, RN, BSN on 05/07/2010   Method used:   Electronically to        [facility] (retail)       [address]       [place], [place]  [address]       Ph: [phone]       Fax: [phone]   RxID:   [id number]

## 2010-11-18 NOTE — Procedures (Signed)
Summary: Upper Endoscopy  Patient: [name] Note: All result statuses are Final unless otherwise noted.  Tests: (1) Upper Endoscopy (EGD)   EGD Upper Endoscopy       DONE     [facility]     [address]     [place], [place]  [address]           ENDOSCOPY PROCEDURE REPORT           PATIENT:  [name], [name]  MR#:  [id number]     BIRTHDATE:  1931-05-25, 79 yrs. old  GENDER:  male           ENDOSCOPIST:  [name], MD, [facility]     Referred by:           PROCEDURE DATE:  09/20/2010     PROCEDURE:  EGD with biopsy, 43239, Maloney Dilation of Esophagus     ASA CLASS:  Class III     INDICATIONS:  abdominal pain, GERD, dysphagia           MEDICATIONS:   There was residual sedation effect present from     prior procedure., Versed 1 mg IV     TOPICAL ANESTHETIC:  Exactacain Spray           DESCRIPTION OF PROCEDURE:   After the risks benefits and     alternatives of the procedure were thoroughly explained, informed     consent was obtained.  The LB GIF-H180 [id number] endoscope was     introduced through the mouth and advanced to the second portion of     the duodenum, without limitations.  The instrument was slowly     withdrawn as the mucosa was fully examined.     <<PROCEDUREIMAGES>>           Normal duodenal folds were noted.  The stomach was entered and     closely examined. The antrum, angularis, and lesser curvature were     well visualized, including a retroflexed view of the cardia and     fundus. The stomach wall was normally distensable. The scope     passed easily through the pylorus into the duodenum. CLO BX. DONE.     A hiatal hernia was found. SMALL 2 CM. HH NOTED  A stricture was     found at the gastroesophageal junction. DILATED #46F MALONEY     DILATOR.MINIMAL RESISTENCE.NO HEME.    Retroflexed views revealed     no abnormalities.    The scope was then withdrawn from the patient     and the procedure completed.           COMPLICATIONS:  None          ENDOSCOPIC IMPRESSION:     1) Normal duodenal folds     2) Normal stomach     3) Hiatal hernia     4) Stricture at the gastroesophageal junction     CHRONIC GERD AND STRICTURE DILATED.HX ALSO OF CHRONIC IBS.     RECOMMENDATIONS:     1) Await biopsy results     2) Clear liquids until, then soft foods rest iof day. Resume     prior diet tomorrow.     3) dilatations PRN     4) continue PPI           REPEAT EXAM:  No           ______________________________     [name], MD, [facility]  CC:  Pricilla Riffle, MD           n.     Rosalie DoctorVania Rea. Patterson at 09/20/2010 03:19 PM           Ernst Breach, [id number]  Note: An exclamation mark (!) indicates a result that was not dispersed into the flowsheet. Document Creation Date: 09/20/2010 3:20 PM _______________________________________________________________________  (1) Order result status: Final Collection or observation date-time: 09/20/2010 15:09 Requested date-time:  Receipt date-time:  Reported date-time:  Referring Physician:   Ordering Physician: Sheryn Bison [phone]) Specimen Source:  Source: Launa Grill Order Number: [id number] Lab site:

## 2010-11-18 NOTE — Assessment & Plan Note (Signed)
Summary: f3w per check out on 11/14/lg   Referring Provider:  na Primary Provider:  Burgess Amor Armeau-Claggett, PA   CC:  3 week ROV; No acute complaints.  History of Present Illness: [name] is a 77 year old male with a history of coronary artery   disease.  He is status post previous percutaneous intervention on the   RCA and the diagonal branch several years ago.  He has had a history of   chronic chest pain with multiple recall recent catheterizations  both to   December of 2010,  December of 2009 and August of 2008 which showed   patent revascularization with only nonobstructive disease.  He also has   a history of asthma, hypertension, hyperlipidemia, chronic dyspnea,   gastroesophageal reflux disease and back pain.    I last saw the patient a few wks. ago.  He was complaining of dizziness. He also complained of chest tightness.   INdeed he was orthostatic on exam.  Urine was concentrated.  I recomm he increase his salt and fluid intake  I recomm that he stop his caduet and also back down on metoporlol He has done this.  He says his headaches have improved.  He says he is still dizzy and unsteady.  This has not changed significantly.  He has not fallen or passed out.  Current Medications (verified): 1)  Ventolin Hfa 108 (90 Base) Mcg/act  Aers (Albuterol Sulfate) .Marland Kitchen.. 1-2 Puffs Every 4-6 Hours As Needed 2)  Flomax 0.4 Mg Xr24h-Cap (Tamsulosin Hcl) .... Once Daily 3)  Fexofenadine Hcl 180 Mg Tabs (Fexofenadine Hcl) .... Take 1 Tablet By Mouth Once A Day 4)  Furosemide 20 Mg Tabs (Furosemide) .... One Tablet By Mouth Once Daily 5)  Hyomax-Sl 0.125 Mg Subl (Hyoscyamine Sulfate) .Marland Kitchen.. 1 Every 8 Hrs 6)  Metoprolol Tartrate 50 Mg Tabs (Metoprolol Tartrate) .... 1/2 Tablet By Mouth 2 Times Per Day 7)  Nexium 40 Mg Cpdr (Esomeprazole Magnesium) .... Take 1 Tablet By Mouth Two Times A Day 8)  Plavix 75 Mg Tabs (Clopidogrel Bisulfate) .... Take 1 Tablet By Mouth Once A Day 9)  Bayer Aspirin  325 Mg Tabs (Aspirin) .... Take 1 Tablet By Mouth Once A Day 10)  Nitrostat 0.4 Mg Subl (Nitroglycerin) .... As Needed 11)  Imdur 60 Mg Xr24h-Tab (Isosorbide Mononitrate) .... Take 1 Tablet By Mouth Once A Day 12)  Singulair 10 Mg Tabs (Montelukast Sodium) .... Take 1 Tablet By Mouth Once A Day 13)  Flovent Hfa 220 Mcg/act Aero (Fluticasone Propionate  Hfa) .... As Directed 14)  Cardura 4 Mg Tabs (Doxazosin Mesylate) .Marland Kitchen.. 1 Tab Once Daily 15)  Gabapentin 100 Mg Caps (Gabapentin) .Marland Kitchen.. 1 Tab Once Daily 16)  Vicodin 5-500 Mg Tabs (Hydrocodone-Acetaminophen) .Marland Kitchen.. 1 Tab As Directed 17)  Carafate 1 Gm/63ml Susp (Sucralfate) .... Drink Slurry 5 Cc Every 6-8 Hours As Needed 18)  Moviprep 100 Gm  Solr (Peg-Kcl-Nacl-Nasulf-Na Asc-C) .... As Per Prep Instructions.  Allergies: 1)  ! Demerol 2)  ! Sulfa 3)  ! Zocor 4)  ! Symbicort (Budesonide-Formoterol Fumarate)  Past History:  Family History: Last updated: 08/26/2010 Father with hx diabetes, asthma, heart disease Mother died of CAD, hx CABG age 39 Family History of Colon Cancer: Oldest Son Family History of Colitis/Crohn's: Middle Son   Social History: Last updated: 08/26/2010 No EtOH Patient never smoked.  Pt is widowed with children. 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   Daily Caffeine Use: 2 daily  Illicit Drug Use -  no  Risk Factors: Smoking Status: never (10/30/2008)  Past medical, surgical, family and social histories (including risk factors) reviewed, and no changes noted (except as noted below).  Past Medical History: Reviewed history from 08/26/2010 and no changes required. COLONIC POLYPS, HX OF (ICD-V12.72) NEOPLASM, MALIGNANT, COLON, FAMILY HX (ICD-V16.0) LOSS OF APPETITE (ICD-783.0) RECTAL BLEEDING (ICD-569.3) ABDOMINAL PAIN, GENERALIZED (ICD-789.07) OTHER DYSPHAGIA (ICD-787.29) ARTHRITIS (ICD-716.90) RECTAL PAIN (ICD-569.42) IBS (ICD-564.1) NAUSEA AND VOMITING (ICD-787.01) ANAL FISSURE, HX  OF (ICD-V13.3) PALPITATIONS, HX OF (ICD-V12.50) ALLERGIC RHINITIS, SEASONAL (ICD-477.0) HEMORRHOIDS, HX OF (ICD-V12.79) DIVERTICULOSIS, COLON, HX OF (ICD-V12.79) NEPHROLITHIASIS, HX OF (ICD-V13.01) MYOCARDIAL INFARCTION (ICD-410.90) * ANTERIOR CERVICAL DECOMPRESSION GAIT DISTURBANCE, HX OF (ICD-781.2) PERIPHERAL NEUROPATHY (ICD-356.9) LAMINECTOMY, HX OF (ICD-V45.89) DEPRESSION, HX OF (ICD-V11.8) BENIGN PROSTATIC HYPERTROPHY, HX OF (ICD-V13.8) ? of SLEEP APNEA (ICD-780.57) CAD, NATIVE VESSEL (ICD-414.01) HYPERLIPIDEMIA-MIXED (ICD-272.4) ESSENTIAL HYPERTENSION (ICD-401.9) DYSPNEA (ICD-786.05) ASTHMA, UNSPECIFIED, UNSPECIFIED STATUS (ICD-493.90) GERD (ICD-530.81)   Esophageal Stricture  Past Surgical History: Reviewed history from 08/26/2010 and no changes required.  ANTERIOR CERVICAL DECOMPRESSION LAMINECTOMY, HX OF (ICD-V45.89) Stent Surgery Multiple Neck Surgery Multiple Back Surgery  Family History: Reviewed history from 08/26/2010 and no changes required. Father with hx diabetes, asthma, heart disease Mother died of CAD, hx CABG age 36 Family History of Colon Cancer: Oldest Son Family History of Colitis/Crohn's: Middle Son   Social History: Reviewed history from 08/26/2010 and no changes required. No EtOH Patient never smoked.  Pt is widowed with children. 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   Daily Caffeine Use: 2 daily  Illicit Drug Use - no  Vital Signs:  Patient profile:   77 year old male Height:      70 inches Weight:      175 pounds BMI:     25.20 Pulse rate:   60 / minute Pulse (ortho):   64 / minute BP sitting:   160 / 58  (left arm) BP standing:   142 / 70 Cuff size:   regular  Vitals Entered By: Stanton Kidney, EMT-P (September 23, 2010 12:18 PM)  Serial Vital Signs/Assessments:  Time      Position  BP       Pulse  Resp  Temp     By 1:00 PM   Lying LA  155/77   55                    Stanton Kidney, EMT-P 1:00 PM   Sitting    154/70   60                    Stanton Kidney, EMT-P 1:00 PM   Standing  142/70   64                    Stanton Kidney, EMT-P  Comments: 1:00 PM Lying- no symptoms Sitting-no symptoms Standing 0 min- 'swimmy head feeling' 2 min Standing- 144/68, HR 64, same By: Stanton Kidney, EMT-P    Physical Exam  Additional Exam:  Patient is in NAD HEENT:  Normocephalic, atraumatic. EOMI, PERRLA.  Neck: JVP is normal. No thyromegaly. No bruits.  Lungs: clear to auscultation. No rales no wheezes.  Heart: Regular rate and rhythm. Normal S1, S2. No S3.   No significant murmurs. PMI not displaced.  Abdomen:  Supple, nontender. Normal bowel sounds. No masses. No hepatomegaly.  Extremities:   Good distal pulses throughout. No lower extremity edema.  Musculoskeletal :moving all extremities.  Neuro:   alert and oriented  x3.    Impression & Recommendations:  Problem # 1:  ORTHOSTATIC DIZZINESS (ICD-780.4) He is not orthostatitc on exam today.  It is stilll difficult though to figure out whether it is dizziness or unsteadiness that the is suffering from.  He said it has been going on for a long time. I will need to review his records.  I wonder if he should be seen in neuro.  [facility] is also followed at [facility].  Problem # 2:  CAD, NATIVE VESSEL (ICD-414.01) No signs of active problems.  Chest pressure actually imporved with increased fluids.  Keep on regien. His updated medication list for this problem includes:    Metoprolol Tartrate 50 Mg Tabs (Metoprolol tartrate) .Marland Kitchen... 1/2 tablet by mouth 2 times per day    Plavix 75 Mg Tabs (Clopidogrel bisulfate) .Marland Kitchen... Take 1 tablet by mouth once a day    Bayer Aspirin 325 Mg Tabs (Aspirin) .Marland Kitchen... Take 1 tablet by mouth once a day    Nitrostat 0.4 Mg Subl (Nitroglycerin) .Marland Kitchen... 1 tablet under tongue at onset of chest pain; you may repeat every 5 minutes for up to 3 doses.    Imdur 60 Mg Xr24h-tab (Isosorbide mononitrate) .Marland Kitchen... Take 1 tablet by mouth once a  day  Problem # 3:  ESSENTIAL HYPERTENSION (ICD-401.9) WIll keep on metoprolol.  Norvasc has been d/cd.  His Bp was high on arrival but it improved with sitting.  WIth recent orthostatis I am reluctant to lower further. His updated medication list for this problem includes:    Furosemide 20 Mg Tabs (Furosemide) ..... One tablet by mouth once daily    Metoprolol Tartrate 50 Mg Tabs (Metoprolol tartrate) .Marland Kitchen... 1/2 tablet by mouth 2 times per day    Bayer Aspirin 325 Mg Tabs (Aspirin) .Marland Kitchen... Take 1 tablet by mouth once a day    Cardura 4 Mg Tabs (Doxazosin mesylate) .Marland Kitchen... 1 tab once daily  Problem # 4:  HYPERLIPIDEMIA-MIXED (ICD-272.4) Resume lipitor. His updated medication list for this problem includes:    Lipitor 40 Mg Tabs (Atorvastatin calcium) .Marland Kitchen... Take one tablet by mouth daily.  Patient Instructions: 1)  Your physician recommends that you schedule a follow-up appointment in: we will call you 2)  Your physician has recommended you make the following change in your medication: start lipitor 40mg  qd Prescriptions: LIPITOR 40 MG TABS (ATORVASTATIN CALCIUM) Take one tablet by mouth daily.  #30 x 11   Entered and Authorized by:   Sherrill Raring, MD, [facility]   Signed by:   Sherrill Raring, MD, [facility] on 09/24/2010   Method used:   Electronically to        [facility] (retail)       [address]       [place], [place]  [address]       Ph: [phone]       Fax: [phone]   RxID:   [id number] NITROSTAT 0.4 MG SUBL (NITROGLYCERIN) 1 tablet under tongue at onset of chest pain; you may repeat every 5 minutes for up to 3 doses.  #30 x 3   Entered and Authorized by:   Sherrill Raring, MD, [facility]   Signed by:   Sherrill Raring, MD, [facility] on 09/24/2010   Method used:   Electronically to        [facility] (retail)       [address]  [place], [place]  [address]       Ph: [phone]       Fax:  [phone]   RxID:   [id number]

## 2010-11-18 NOTE — Assessment & Plan Note (Signed)
Summary: 6 month rov.sl    Visit Type:  6 mo f/u Referring Provider:  na Primary Provider:  Burgess Amor Armeau-Claggett, PA   CC:  sob....edema/hands...denies any cp or leg pain.  History of Present Illness: [name] is a 77 year old male with a history of coronary artery   disease.  He is status post previous percutaneous intervention on the   RCA and the diagonal branch several years ago.  He has had a history of   chronic chest pain with multiple recall recent catheterizations last in   December of 2010,which showed   patent revascularization with only nonobstructive disease.  He also has   a history of asthma, hypertension, hyperlipidemia, chronic dyspnea,   gastroesophageal reflux disease and back pain.    I last saw the patient in December  Prior to that he complained of dizziness.  He was orthostatic on exam and urine was concentrated.  SYmptoms improved but did not resolve with increased fluid. Since I saw him he says his breathing is getting better now that he is getting outside some No chest pain.  Still has some dizziness.  Wonders if it is related to his trigeminal neuralgia. No falling.  Current Medications (verified): 1)  Ventolin Hfa 108 (90 Base) Mcg/act  Aers (Albuterol Sulfate) .Marland Kitchen.. 1-2 Puffs Every 4-6 Hours As Needed 2)  Flomax 0.4 Mg Xr24h-Cap (Tamsulosin Hcl) .... Once Daily 3)  Furosemide 20 Mg Tabs (Furosemide) .... One Tablet By Mouth Once Daily 4)  Metoprolol Tartrate 50 Mg Tabs (Metoprolol Tartrate) .... 1/2 Tablet By Mouth 2 Times Per Day 5)  Nexium 40 Mg Cpdr (Esomeprazole Magnesium) .... Take 1 Tablet By Mouth Two Times A Day 6)  Plavix 75 Mg Tabs (Clopidogrel Bisulfate) .... Take 1 Tablet By Mouth Once A Day 7)  Bayer Aspirin 325 Mg Tabs (Aspirin) .... Take 1 Tablet By Mouth Once A Day 8)  Nitrostat 0.4 Mg Subl (Nitroglycerin) .Marland Kitchen.. 1 Tablet Under Tongue At Onset of Chest Pain; You May Repeat Every 5 Minutes For Up To 3 Doses. 9)  Imdur 60 Mg Xr24h-Tab  (Isosorbide Mononitrate) .... Take 1 Tablet By Mouth Once A Day 10)  Singulair 10 Mg Tabs (Montelukast Sodium) .... Take 1 Tablet By Mouth Once A Day 11)  Flovent Hfa 220 Mcg/act Aero (Fluticasone Propionate  Hfa) .... As Directed 12)  Gabapentin 100 Mg Caps (Gabapentin) .Marland Kitchen.. 1 Tab Once Daily 13)  Vicodin 5-500 Mg Tabs (Hydrocodone-Acetaminophen) .Marland Kitchen.. 1 Tab As Directed 14)  Lipitor 40 Mg Tabs (Atorvastatin Calcium) .... Take One Tablet By Mouth Daily.  Allergies: 1)  ! Demerol 2)  ! Sulfa 3)  ! Zocor 4)  ! Symbicort (Budesonide-Formoterol Fumarate)  Past History:  Past Surgical History: Last updated: 08/26/2010  ANTERIOR CERVICAL DECOMPRESSION LAMINECTOMY, HX OF (ICD-V45.89) Stent Surgery Multiple Neck Surgery Multiple Back Surgery  Family History: Last updated: 08/26/2010 Father with hx diabetes, asthma, heart disease Mother died of CAD, hx CABG age 57 Family History of Colon Cancer: Oldest Son Family History of Colitis/Crohn's: Middle Son   Social History: Last updated: 08/26/2010 No EtOH Patient never smoked.  Pt is widowed with children. 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   Daily Caffeine Use: 2 daily  Illicit Drug Use - no  Past Medical History: COLONIC POLYPS, HX OF (ICD-V12.72) NEOPLASM, MALIGNANT, COLON, FAMILY HX (ICD-V16.0) LOSS OF APPETITE (ICD-783.0) RECTAL BLEEDING (ICD-569.3) ABDOMINAL PAIN, GENERALIZED (ICD-789.07) OTHER DYSPHAGIA (ICD-787.29) ARTHRITIS (ICD-716.90) RECTAL PAIN (ICD-569.42) IBS (ICD-564.1) NAUSEA AND VOMITING (ICD-787.01) ANAL  FISSURE, HX OF (ICD-V13.3) PALPITATIONS, HX OF (ICD-V12.50) ALLERGIC RHINITIS, SEASONAL (ICD-477.0) HEMORRHOIDS, HX OF (ICD-V12.79) DIVERTICULOSIS, COLON, HX OF (ICD-V12.79) NEPHROLITHIASIS, HX OF (ICD-V13.01) MYOCARDIAL INFARCTION (ICD-410.90) * ANTERIOR CERVICAL DECOMPRESSION GAIT DISTURBANCE, HX OF (ICD-781.2) PERIPHERAL NEUROPATHY (ICD-356.9) LAMINECTOMY, HX OF  (ICD-V45.89) DEPRESSION, HX OF (ICD-V11.8) BENIGN PROSTATIC HYPERTROPHY, HX OF (ICD-V13.8) ? of SLEEP APNEA (ICD-780.57) CAD, NATIVE VESSEL (ICD-414.01) HYPERLIPIDEMIA-MIXED (ICD-272.4) ESSENTIAL HYPERTENSION (ICD-401.9) DYSPNEA (ICD-786.05) ASTHMA, UNSPECIFIED, UNSPECIFIED STATUS (ICD-493.90) GERD (ICD-530.81)   Esophageal Stricture Dizziness  Review of Systems       Reivewed.  Neg to the above problem.  Vital Signs:  Patient profile:   77 year old male Height:      70 inches Weight:      177.75 pounds BMI:     25.60 Pulse rate:   58 / minute Pulse rhythm:   irregular BP sitting:   158 / 78  (left arm) Cuff size:   large  Vitals Entered By: Danielle Rankin, CMA (October 22, 2010 11:14 AM)  Physical Exam  Additional Exam:  Patient in NAD HEENT:  Normocephalic, atraumatic. EOMI, PERRLA.  Neck: JVP is normal. No thyromegaly. No bruits.  Lungs: clear to auscultation. No rales no wheezes.  Heart: Regular rate and rhythm. Normal S1, S2. No S3.   No significant murmurs. PMI not displaced.  Abdomen:  Supple, nontender. Normal bowel sounds. No masses. No hepatomegaly.  Extremities:   Good distal pulses throughout. No lower extremity edema.  Musculoskeletal :moving all extremities.  Neuro:   alert and oriented x3.    Impression & Recommendations:  Problem # 1:  CAD, NATIVE VESSEL (ICD-414.01) No sysmptoms to sugg ischemia.  COntinue meds.  Problem # 2:  HYPERLIPIDEMIA-MIXED (ICD-272.4) Labs today. His updated medication list for this problem includes:    Lipitor 40 Mg Tabs (Atorvastatin calcium) .Marland Kitchen... Take one tablet by mouth daily.  Orders: TLB-AST (SGOT) (84450-SGOT) TLB-Lipid Panel (80061-LIPID)  Problem # 3:  ESSENTIAL HYPERTENSION (ICD-401.9) Continue current regimen  Problem # 4:  DIZZINESS (ICD-780.4) Encouraged by mouth intake.  Will check with neuro re f/u.  Other Orders: TLB-BMP (Basic Metabolic Panel-BMET) (80048-METABOL)  Patient Instructions: 1)   Your physician recommends that you return for lab work in: lab work today...we will call  you with results 2)  Your physician wants you to follow-up in JUNE 2012:   You will receive a reminder letter in the mail two months in advance. If you don't receive a letter, please call our office to schedule the follow-up appointment.

## 2010-12-27 ENCOUNTER — Telehealth: Payer: Self-pay | Admitting: Internal Medicine

## 2010-12-28 LAB — BASIC METABOLIC PANEL
Calcium: 9.1 mg/dL (ref 8.4–10.5)
Creatinine, Ser: 0.8 mg/dL (ref 0.4–1.5)
GFR calc Af Amer: >60 mL/min (ref >60)
GFR calc non Af Amer: >60 mL/min (ref >60)
Glucose, Bld: 113 mg/dL — ABNORMAL HIGH (ref 70–99)
Potassium: 3.7 mEq/L (ref 3.5–5.1)

## 2010-12-28 LAB — POCT CARDIAC MARKERS
CKMB, poc: 1.2 ng/mL (ref 1.0–8.0)
CKMB, poc: 1.2 ng/mL (ref 1.0–8.0)
Myoglobin, poc: 84.8 ng/mL (ref 12–200)
Myoglobin, poc: 93.8 ng/mL (ref 12–200)
Troponin i, poc: <0.05 ng/mL (ref 0.00–0.09)
Troponin i, poc: <0.05 ng/mL (ref 0.00–0.09)

## 2010-12-28 LAB — DIFFERENTIAL
Eosinophils Absolute: 0.3 10*3/uL (ref 0.0–0.7)
Eosinophils Relative: 5 % (ref 0–5)
Lymphs Abs: 1.5 10*3/uL (ref 0.7–4.0)
Monocytes Absolute: 0.6 10*3/uL (ref 0.1–1.0)
Monocytes Relative: 10 % (ref 3–12)
Neutrophils Relative %: 58 % (ref 43–77)

## 2010-12-28 LAB — CBC
HCT: 38.8 % — ABNORMAL LOW (ref 39.0–52.0)
Hemoglobin: 13 g/dL (ref 13.0–17.0)
MCH: 30.5 pg (ref 26.0–34.0)
MCV: 91.1 fL (ref 78.0–100.0)
RBC: 4.26 MIL/uL (ref 4.22–5.81)

## 2010-12-30 ENCOUNTER — Ambulatory Visit: Payer: Self-pay | Admitting: Internal Medicine

## 2011-01-02 LAB — CK TOTAL AND CKMB (NOT AT ARMC)
CK, MB: 2.1 ng/mL (ref 0.3–4.0)
Relative Index: INVALID (ref 0.0–2.5)
Total CK: 111 U/L (ref 7–232)
Total CK: 82 U/L (ref 7–232)

## 2011-01-02 LAB — COMPREHENSIVE METABOLIC PANEL
AST: 25 U/L (ref 0–37)
Albumin: 3.9 g/dL (ref 3.5–5.2)
Alkaline Phosphatase: 85 U/L (ref 39–117)
CO2: 27 mEq/L (ref 19–32)
Chloride: 105 mEq/L (ref 96–112)
Creatinine, Ser: 0.86 mg/dL (ref 0.4–1.5)
GFR calc Af Amer: >60 mL/min (ref >60)
GFR calc non Af Amer: >60 mL/min (ref >60)
Potassium: 4.1 mEq/L (ref 3.5–5.1)
Total Bilirubin: 0.8 mg/dL (ref 0.3–1.2)

## 2011-01-02 LAB — CBC
Hemoglobin: 12.8 g/dL — ABNORMAL LOW (ref 13.0–17.0)
MCH: 30.7 pg (ref 26.0–34.0)
MCV: 93.6 fL (ref 78.0–100.0)
Platelets: 195 10*3/uL (ref 150–400)
RBC: 4.15 MIL/uL — ABNORMAL LOW (ref 4.22–5.81)
WBC: 6.8 10*3/uL (ref 4.0–10.5)

## 2011-01-02 LAB — PROTIME-INR: Prothrombin Time: 14.1 seconds (ref 11.6–15.2)

## 2011-01-02 LAB — D-DIMER, QUANTITATIVE: D-Dimer, Quant: 0.81 ug/mL-FEU — ABNORMAL HIGH (ref 0.00–0.48)

## 2011-01-02 LAB — TROPONIN I
Troponin I: 0.01 ng/mL (ref 0.00–0.06)
Troponin I: 0.02 ng/mL (ref 0.00–0.06)

## 2011-01-02 LAB — POCT CARDIAC MARKERS
CKMB, poc: 1.7 ng/mL (ref 1.0–8.0)
Troponin i, poc: <0.05 ng/mL (ref 0.00–0.09)

## 2011-01-02 LAB — DIFFERENTIAL
Basophils Absolute: 0 10*3/uL (ref 0.0–0.1)
Basophils Relative: 1 % (ref 0–1)
Eosinophils Absolute: 0.5 10*3/uL (ref 0.0–0.7)
Eosinophils Relative: 7 % — ABNORMAL HIGH (ref 0–5)
Lymphocytes Relative: 26 % (ref 12–46)
Monocytes Absolute: 0.6 10*3/uL (ref 0.1–1.0)

## 2011-01-02 LAB — CARDIAC PANEL(CRET KIN+CKTOT+MB+TROPI)
CK, MB: 2 ng/mL (ref 0.3–4.0)
Total CK: 81 U/L (ref 7–232)

## 2011-01-03 ENCOUNTER — Other Ambulatory Visit: Payer: Self-pay | Admitting: Internal Medicine

## 2011-01-03 ENCOUNTER — Encounter: Payer: Self-pay | Admitting: Internal Medicine

## 2011-01-03 ENCOUNTER — Ambulatory Visit (INDEPENDENT_AMBULATORY_CARE_PROVIDER_SITE_OTHER): Payer: Medicare Other | Admitting: Internal Medicine

## 2011-01-03 DIAGNOSIS — R0789 Other chest pain: Secondary | ICD-10-CM

## 2011-01-03 DIAGNOSIS — E785 Hyperlipidemia, unspecified: Secondary | ICD-10-CM

## 2011-01-03 DIAGNOSIS — R42 Dizziness and giddiness: Secondary | ICD-10-CM

## 2011-01-03 DIAGNOSIS — E7800 Pure hypercholesterolemia, unspecified: Secondary | ICD-10-CM

## 2011-01-03 DIAGNOSIS — I2510 Atherosclerotic heart disease of native coronary artery without angina pectoris: Secondary | ICD-10-CM

## 2011-01-03 LAB — BASIC METABOLIC PANEL
BUN: 9 mg/dL (ref 6–23)
Creatinine, Ser: 0.9 mg/dL (ref 0.4–1.5)
GFR: 91.5 mL/min (ref >60.00)
Potassium: 3.9 mEq/L (ref 3.5–5.1)

## 2011-01-03 LAB — POCT I-STAT, CHEM 8
Creatinine, Ser: 0.8 mg/dL (ref 0.4–1.5)
HCT: 38 % — ABNORMAL LOW (ref 39.0–52.0)
Hemoglobin: 12.9 g/dL — ABNORMAL LOW (ref 13.0–17.0)
Potassium: 3.5 mEq/L (ref 3.5–5.1)
Sodium: 141 mEq/L (ref 135–145)
TCO2: 26 mmol/L (ref 0–100)

## 2011-01-03 LAB — CBC WITH DIFFERENTIAL/PLATELET
Basophils Relative: 0.6 % (ref 0.0–3.0)
Eosinophils Relative: 4.5 % (ref 0.0–5.0)
HCT: 39.4 % (ref 39.0–52.0)
Monocytes Relative: 8.6 % (ref 3.0–12.0)
Neutrophils Relative %: 59.8 % (ref 43.0–77.0)
Platelets: 192 10*3/uL (ref 150.0–400.0)
RBC: 4.3 Mil/uL (ref 4.22–5.81)
WBC: 5.9 10*3/uL (ref 4.5–10.5)

## 2011-01-03 LAB — TSH: TSH: 1.49 u[IU]/mL (ref 0.35–5.50)

## 2011-01-03 LAB — PROTIME-INR
INR: 1 ratio (ref 0.8–1.0)
Prothrombin Time: 11.6 s (ref 10.2–12.4)

## 2011-01-04 ENCOUNTER — Ambulatory Visit (HOSPITAL_COMMUNITY): Admission: RE | Admit: 2011-01-04 | Payer: Medicare Other | Source: Ambulatory Visit | Admitting: Cardiovascular Disease

## 2011-01-04 NOTE — Progress Notes (Signed)
Summary: chest tightness sob   Phone Note Call from Patient Call back at Home Phone [phone]   Reason for Call: Talk to Nurse Summary of Call: pt having sob chest tigtness and dizziness for couple weeks but now its getting worse. Initial call taken by: Roe Coombs,  December 27, 2010 11:28 AM  Follow-up for Phone Call        pt not having pain now.  States when he gets up in the mornings he is real dizzy and gets tight in chest and SOB.  he can shave and this happens  He will use NTG and gets relief.  The tightness is there 24/7 for 2-3 weeks and getting stronger as time goes on. He feels like since the D/cing of Caduet his problems have goten worse. I offered him an appt this Thurs at 9:00 but he had a prior appt.  So we change it to [facility] 01/03/11 at 10:45.  He will go to the ER if things get any worse. Dennis Bast, RN, BSN  December 27, 2010 12:12 PM

## 2011-01-05 ENCOUNTER — Inpatient Hospital Stay (HOSPITAL_BASED_OUTPATIENT_CLINIC_OR_DEPARTMENT_OTHER)
Admission: RE | Admit: 2011-01-05 | Discharge: 2011-01-05 | Disposition: A | Discharge status: Home / Self Care | Payer: Medicare Other | Source: Ambulatory Visit | Attending: Cardiovascular Disease | Admitting: Cardiovascular Disease

## 2011-01-05 DIAGNOSIS — I2510 Atherosclerotic heart disease of native coronary artery without angina pectoris: Secondary | ICD-10-CM

## 2011-01-05 DIAGNOSIS — R079 Chest pain, unspecified: Secondary | ICD-10-CM | POA: Insufficient documentation

## 2011-01-05 DIAGNOSIS — Z9861 Coronary angioplasty status: Secondary | ICD-10-CM | POA: Insufficient documentation

## 2011-01-10 ENCOUNTER — Encounter: Payer: Self-pay | Admitting: Internal Medicine

## 2011-01-13 NOTE — Assessment & Plan Note (Signed)
Summary: chest pain, sob, relieved w/ntg/hms    Referring Provider:  na Primary Provider:  Burgess Amor Armeau-Claggett, PA    History of Present Illness: [name] is a 77 year old male with a history of coronary artery   disease.  He is status post previous percutaneous intervention on the   RCA and the diagonal branch several years ago.  He has had a history of   chronic chest pain with multiple recall recent catheterizations last in   December of 2010,which showed   patent revascularization with only nonobstructive disease.  He also has   a history of asthma, hypertension, hyperlipidemia, chronic dyspnea,   gastroesophageal reflux disease and back pain.  I saw him in clinic in January the patinet called in last [facility] saying he had increased episodes of chest pressure.  Now almost continuous.  He gets more short of breath with walking He also complains of dizziness that continues.  Can be shaving, combing hair and feels like he is going to fall out Also notes palpitatoins.  Heart will beat irregularly 1 to 2 x per day.  Lasts minutes.  Usually at rest.  No dizziness Also has intention tremor.  Current Medications (verified): 1)  Ventolin Hfa 108 (90 Base) Mcg/act  Aers (Albuterol Sulfate) .Marland Kitchen.. 1-2 Puffs Every 4-6 Hours As Needed 2)  Flomax 0.4 Mg Xr24h-Cap (Tamsulosin Hcl) .... Once Daily 3)  Furosemide 20 Mg Tabs (Furosemide) .... One Tablet By Mouth Once Daily 4)  Metoprolol Tartrate 50 Mg Tabs (Metoprolol Tartrate) .... 1/2 Tablet By Mouth 2 Times Per Day 5)  Nexium 40 Mg Cpdr (Esomeprazole Magnesium) .... Take 1 Tablet By Mouth Two Times A Day 6)  Plavix 75 Mg Tabs (Clopidogrel Bisulfate) .... Take 1 Tablet By Mouth Once A Day 7)  Bayer Aspirin 325 Mg Tabs (Aspirin) .... Take 1 Tablet By Mouth Once A Day 8)  Nitrostat 0.4 Mg Subl (Nitroglycerin) .Marland Kitchen.. 1 Tablet Under Tongue At Onset of Chest Pain; You May Repeat Every 5 Minutes For Up To 3 Doses. 9)  Imdur 60 Mg Xr24h-Tab (Isosorbide  Mononitrate) .... Take 1 Tablet By Mouth Once A Day 10)  Singulair 10 Mg Tabs (Montelukast Sodium) .... Take 1 Tablet By Mouth Once A Day 11)  Flovent Hfa 220 Mcg/act Aero (Fluticasone Propionate  Hfa) .... As Directed 12)  Gabapentin 100 Mg Caps (Gabapentin) .Marland Kitchen.. 1 Tab Once Daily 13)  Vicodin 5-500 Mg Tabs (Hydrocodone-Acetaminophen) .Marland Kitchen.. 1 Tab As Directed 14)  Lipitor 40 Mg Tabs (Atorvastatin Calcium) .... Take One Tablet By Mouth Daily.  Allergies: 1)  ! Demerol 2)  ! Sulfa 3)  ! Zocor 4)  ! Symbicort (Budesonide-Formoterol Fumarate)  Past History:  Past medical, surgical, family and social histories (including risk factors) reviewed, and no changes noted (except as noted below).  Past Medical History: Reviewed history from 10/22/2010 and no changes required. COLONIC POLYPS, HX OF (ICD-V12.72) NEOPLASM, MALIGNANT, COLON, FAMILY HX (ICD-V16.0) LOSS OF APPETITE (ICD-783.0) RECTAL BLEEDING (ICD-569.3) ABDOMINAL PAIN, GENERALIZED (ICD-789.07) OTHER DYSPHAGIA (ICD-787.29) ARTHRITIS (ICD-716.90) RECTAL PAIN (ICD-569.42) IBS (ICD-564.1) NAUSEA AND VOMITING (ICD-787.01) ANAL FISSURE, HX OF (ICD-V13.3) PALPITATIONS, HX OF (ICD-V12.50) ALLERGIC RHINITIS, SEASONAL (ICD-477.0) HEMORRHOIDS, HX OF (ICD-V12.79) DIVERTICULOSIS, COLON, HX OF (ICD-V12.79) NEPHROLITHIASIS, HX OF (ICD-V13.01) MYOCARDIAL INFARCTION (ICD-410.90) * ANTERIOR CERVICAL DECOMPRESSION GAIT DISTURBANCE, HX OF (ICD-781.2) PERIPHERAL NEUROPATHY (ICD-356.9) LAMINECTOMY, HX OF (ICD-V45.89) DEPRESSION, HX OF (ICD-V11.8) BENIGN PROSTATIC HYPERTROPHY, HX OF (ICD-V13.8) ? of SLEEP APNEA (ICD-780.57) CAD, NATIVE VESSEL (ICD-414.01) HYPERLIPIDEMIA-MIXED (ICD-272.4) ESSENTIAL HYPERTENSION (ICD-401.9) DYSPNEA (ICD-786.05) ASTHMA,  UNSPECIFIED, UNSPECIFIED STATUS (ICD-493.90) GERD (ICD-530.81)   Esophageal Stricture Dizziness  Past Surgical History: Reviewed history from 08/26/2010 and no changes required.  ANTERIOR  CERVICAL DECOMPRESSION LAMINECTOMY, HX OF (ICD-V45.89) Stent Surgery Multiple Neck Surgery Multiple Back Surgery  Family History: Reviewed history from 08/26/2010 and no changes required. Father with hx diabetes, asthma, heart disease Mother died of CAD, hx CABG age 18 Family History of Colon Cancer: Oldest Son Family History of Colitis/Crohn's: Middle Son   Social History: Reviewed history from 08/26/2010 and no changes required. No EtOH Patient never smoked.  Pt is widowed with children. 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   Daily Caffeine Use: 2 daily  Illicit Drug Use - no  Review of Systems       All systems reviewed.  neg to the above problem except as noted above.  Vital Signs:  Patient profile:   77 year old male Height:      70 inches Weight:      173 pounds BMI:     24.91 Pulse rate:   64 / minute Resp:     14 per minute BP sitting:   166 / 85  (left arm)  Vitals Entered By: Kem Parkinson (January 03, 2011 11:30 AM)  Physical Exam  Additional Exam:  Patinet is in NAD HEENT:  Normocephalic, atraumatic. EOMI, PERRLA.  Neck: JVP is normal. No thyromegaly. No bruits.  Lungs: clear to auscultation. No rales no wheezes.  Heart: Regular rate and rhythm. Normal S1, S2. No S3.   No significant murmurs. PMI not displaced.  Abdomen:  Supple, nontender. Normal bowel sounds. No masses. No hepatomegaly.  Extremities:   Good distal pulses throughout. No lower extremity edema.  Musculoskeletal :moving all extremities.  Neuro:   alert and oriented x3.    EKG  Procedure date:  01/03/2011  Findings:      NSR.  64 bpm.  Occasional PVC  Impression & Recommendations:  Problem # 1:  CAD, NATIVE VESSEL (ICD-414.01) I just saw the patient earlier this winter.  He has had catheterizations in the past with no progression in disease.  He says current symptoms are like those he had when he needed stents.  Because of htis will schedule for  cath.  Problem # 2:  ESOPHAGEAL STRICTURE (ICD-530.3) Claims swallowing is OK  Problem # 3:  ORTHOSTATIC DIZZINESS (ICD-780.4) Still dizzy.  Orthostatics on last two visits were OK.  I would keep on same meds even though bp is high now.l Consider carotdi doppler.  will adress after cath.  Problem # 4:  PALPITATIONS, HX OF (ICD-V12.50) Holter in November showed frequent PVC.  Currently in SR.  Not sure what spells of palpitations repr.  Would keep on same regimen for now.  Problem # 5:  HYPERLIPIDEMIA-MIXED (ICD-272.4) Keep on same regimen. His updated medication list for this problem includes:    Lipitor 40 Mg Tabs (Atorvastatin calcium) .Marland Kitchen... Take one tablet by mouth daily.  Other Orders: TLB-BMP (Basic Metabolic Panel-BMET) (80048-METABOL) TLB-CBC Platelet - w/Differential (85025-CBCD) TLB-PT (Protime) (85610-PTP) TLB-PTT (85730-PTTL) TLB-TSH (Thyroid Stimulating Hormone) (34742-VZD)  Patient Instructions: 1)  Your physician recommends that you schedule a follow-up appointment in: WE WILL LET YOU KNOW AFTER HEART CATH 2)  Your physician recommends that you return for lab work in: TODAY--cbc,bmet,tsh,pt/ptt 3)  Your physician has requested that you have a cardiac catheterization.  Cardiac catheterization is used to diagnose and/or treat various heart conditions. Doctors may recommend this procedure for a number of  different reasons. The most common reason is to evaluate chest pain. Chest pain can be a symptom of coronary artery disease (CAD), and cardiac catheterization can show whether plaque is narrowing or blocking your heart's arteries. This procedure is also used to evaluate the valves, as well as measure the blood flow and oxygen levels in different parts of your heart.  For further information please visit [email or web address]  Please follow instruction sheet, as given.

## 2011-01-18 ENCOUNTER — Encounter: Payer: Self-pay | Admitting: Cardiovascular Disease

## 2011-01-18 LAB — CBC
HCT: 37.2 % — ABNORMAL LOW (ref 39.0–52.0)
MCHC: 33.7 g/dL (ref 30.0–36.0)
MCHC: 33.9 g/dL (ref 30.0–36.0)
Platelets: 169 10*3/uL (ref 150–400)
RBC: 3.72 MIL/uL — ABNORMAL LOW (ref 4.22–5.81)
RBC: 4.1 MIL/uL — ABNORMAL LOW (ref 4.22–5.81)
RBC: 4.11 MIL/uL — ABNORMAL LOW (ref 4.22–5.81)
RBC: 4.14 MIL/uL — ABNORMAL LOW (ref 4.22–5.81)
RDW: 14.7 % (ref 11.5–15.5)
WBC: 6.3 10*3/uL (ref 4.0–10.5)
WBC: 6.8 10*3/uL (ref 4.0–10.5)
WBC: 7.5 10*3/uL (ref 4.0–10.5)

## 2011-01-18 LAB — DIFFERENTIAL
Basophils Relative: 1 % (ref 0–1)
Lymphocytes Relative: 32 % (ref 12–46)
Lymphs Abs: 2.2 10*3/uL (ref 0.7–4.0)
Monocytes Relative: 12 % (ref 3–12)
Neutro Abs: 3.4 10*3/uL (ref 1.7–7.7)
Neutrophils Relative %: 50 % (ref 43–77)

## 2011-01-18 LAB — BASIC METABOLIC PANEL
Calcium: 9 mg/dL (ref 8.4–10.5)
Calcium: 9 mg/dL (ref 8.4–10.5)
Creatinine, Ser: 0.9 mg/dL (ref 0.4–1.5)
Creatinine, Ser: 0.93 mg/dL (ref 0.4–1.5)
GFR calc Af Amer: >60 mL/min (ref >60)
GFR calc Af Amer: >60 mL/min (ref >60)
GFR calc non Af Amer: >60 mL/min (ref >60)

## 2011-01-18 LAB — POCT CARDIAC MARKERS: Myoglobin, poc: 167 ng/mL (ref 12–200)

## 2011-01-18 LAB — PROTIME-INR
INR: 1.1 (ref 0.00–1.49)
Prothrombin Time: 14.1 seconds (ref 11.6–15.2)

## 2011-01-18 LAB — CARDIAC PANEL(CRET KIN+CKTOT+MB+TROPI)
CK, MB: 2.3 ng/mL (ref 0.3–4.0)
Relative Index: 2.2 (ref 0.0–2.5)
Total CK: 104 U/L (ref 7–232)

## 2011-01-18 LAB — CK TOTAL AND CKMB (NOT AT ARMC): Total CK: 155 U/L (ref 7–232)

## 2011-01-18 LAB — HEPARIN LEVEL (UNFRACTIONATED): Heparin Unfractionated: 0.63 IU/mL (ref 0.30–0.70)

## 2011-01-18 LAB — TROPONIN I: Troponin I: 0.01 ng/mL (ref 0.00–0.06)

## 2011-01-19 ENCOUNTER — Encounter: Payer: Self-pay | Admitting: Cardiovascular Disease

## 2011-01-19 ENCOUNTER — Ambulatory Visit (INDEPENDENT_AMBULATORY_CARE_PROVIDER_SITE_OTHER): Payer: Medicare Other | Admitting: Cardiovascular Disease

## 2011-01-19 VITALS — BP 127/72 | HR 59 | Resp 18 | Ht 70.0 in | Wt 172.8 lb

## 2011-01-19 DIAGNOSIS — I2510 Atherosclerotic heart disease of native coronary artery without angina pectoris: Secondary | ICD-10-CM

## 2011-01-19 NOTE — Progress Notes (Signed)
HPI:  This is a 77 year old gentleman presenting for followup of coronary artery disease. The patient has been followed by Dr. Tenny Craw, and he recently had a heart catheterization to evaluate progressive angina. His catheterization demonstrated severe stenosis of the ostium of his first diagonal branch of the LAD. The patient has patent stents with nonobstructive disease in the right coronary artery and he has no significant left circumflex disease. He underwent cutting balloon angioplasty of the diagonal branch several years ago.  He describes episodic chest pains, sometimes with exertion and sometimes at rest. He also has esophageal spasm and has some difficulty differentiating his ischemic heart pain from the pain of esophageal spasm. He states that both are relieved by nitroglycerin. He does have clear exertional angina but this has been stable over the last 6-8 months. He is able to do regular daily activities without problems. He denies shortness of breath, edema, lightheadedness, or syncope.  Outpatient Encounter Prescriptions as of 01/19/2011  Medication Sig Dispense Refill  . albuterol (PROVENTIL, VENTOLIN) (5 MG/ML) 0.5% NEBU Take by nebulization as needed.        Marland Kitchen aspirin 325 MG EC tablet Take 325 mg by mouth daily.        . clopidogrel (PLAVIX) 75 MG tablet Take 75 mg by mouth daily.        Marland Kitchen doxazosin (CARDURA) 4 MG tablet Take 1 mg by mouth at bedtime.        Marland Kitchen esomeprazole (NEXIUM) 40 MG capsule Take 40 mg by mouth 2 (two) times daily.        . fexofenadine (ALLEGRA) 180 MG tablet Take 180 mg by mouth daily.        . fluticasone (FLOVENT HFA) 220 MCG/ACT inhaler Inhale 2 puffs into the lungs 2 (two) times daily.        . furosemide (LASIX) 20 MG tablet Take 20 mg by mouth daily.        . hyoscyamine (LEVSIN, ANASPAZ) 0.125 MG tablet Take 0.125 mg by mouth 3 (three) times daily.        . isosorbide mononitrate (IMDUR) 60 MG 24 hr tablet Take 60 mg by mouth daily.        . metoprolol  (LOPRESSOR) 50 MG tablet Take 50 mg by mouth. Take 1 1/2 tablets twice a day       . nitroGLYCERIN (NITROSTAT) 0.4 MG SL tablet Place 0.4 mg under the tongue every 5 (five) minutes as needed.        Marland Kitchen amLODipine-atorvastatin (CADUET) 5-40 MG per tablet Take 1 tablet by mouth daily.        . montelukast (SINGULAIR) 10 MG tablet Take 10 mg by mouth at bedtime.          Allergies  Allergen Reactions  . Meperidine Hcl   . Simvastatin   . Sulfonamide Derivatives   . Symbicort     REACTION: Mouth and tongue swelling    Past Medical History  Diagnosis Date  . Hypertension   . Hyperlipidemia   . Chronic chest pain   . Dyspnea     chronic  . GERD (gastroesophageal reflux disease)   . Asthma   . Back pain   . Coronary artery disease      Moderately tight ostial diagonal stenosis.   Nonobstructive left anterior descending coronary artery and left  circumflex stenosis.  Patent right coronary stents with nonobstructive RCA stenosis.     ROS: Negative except as per HPI  BP 127/72  Pulse  59  Resp 18  Ht 5\' 10"  (1.778 m)  Wt 172 lb 12.8 oz (78.382 kg)  BMI 24.79 kg/m2  PHYSICAL EXAM: Pt is alert and oriented, elderly male in NAD HEENT: normal Neck: JVP - normal, carotids 2+= without bruits Lungs: CTA bilaterally CV: RRR without murmur or gallop Abd: soft, NT, Positive BS, no hepatomegaly Ext: no C/C/E, distal pulses intact and equal Skin: warm/dry no rash  EKG:  Normal sinus rhythm at 59 beats per minute, within normal limits.  ASSESSMENT AND PLAN:

## 2011-01-19 NOTE — Patient Instructions (Signed)
Your physician recommends that you schedule a follow-up appointment in: 6 months with Dr. Tenny Craw

## 2011-01-19 NOTE — Assessment & Plan Note (Signed)
The patient does report good resolution of angina after his Cutting Balloon angioplasty several years ago. At that time he was experiencing progressive chest pain. In the presence of stable symptoms, I have recommended continuation of medical therapy. I don't see much benefit in a nuclear scan as the patient has fairly clear exertional angina. We will reserve PCI for progressive symptoms. After review of his angiogram, I would likely treat him the same way that he has been treated in the past with POBA, as the ostial nature of his diagonal disease is anatomically unfavorable for stenting. The patient understands this plan and is in agreement. He will continue on his current medical program. He will followup with Dr. Tenny Craw in 6 months.

## 2011-01-23 LAB — BASIC METABOLIC PANEL
Chloride: 103 mEq/L (ref 96–112)
Creatinine, Ser: 0.88 mg/dL (ref 0.4–1.5)
GFR calc Af Amer: >60 mL/min (ref >60)
Potassium: 4.1 mEq/L (ref 3.5–5.1)

## 2011-01-23 LAB — URINALYSIS, ROUTINE W REFLEX MICROSCOPIC
Glucose, UA: NEGATIVE mg/dL
Specific Gravity, Urine: 1.004 — ABNORMAL LOW (ref 1.005–1.030)
pH: 5.5 (ref 5.0–8.0)

## 2011-01-23 LAB — DIFFERENTIAL
Eosinophils Absolute: 0.4 10*3/uL (ref 0.0–0.7)
Lymphs Abs: 2 10*3/uL (ref 0.7–4.0)
Monocytes Relative: 14 % — ABNORMAL HIGH (ref 3–12)
Neutrophils Relative %: 59 % (ref 43–77)

## 2011-01-23 LAB — CBC
HCT: 38 % — ABNORMAL LOW (ref 39.0–52.0)
MCV: 89 fL (ref 78.0–100.0)
RBC: 4.28 MIL/uL (ref 4.22–5.81)
WBC: 8.9 10*3/uL (ref 4.0–10.5)

## 2011-03-01 NOTE — Cardiovascular Report (Signed)
NAMEBEDFORD, [name] NO.:  [id number]   MEDICAL RECORD NO.:  [id number]          PATIENT TYPE:  INP   LOCATION:  2004                         FACILITY:  [facility]   PHYSICIAN:  Veverly Fells. Excell Seltzer, MD  DATE OF BIRTH:  01-15-1931   DATE OF PROCEDURE:  05/21/2007  DATE OF DISCHARGE:                            CARDIAC CATHETERIZATION   PROCEDURE:  Right heart catheterization, left heart catheterization,  selective coronary angiography, left ventricular angiography.   INDICATIONS:  Mr. Bucklin is a 77 year old gentleman with known coronary  artery disease.  He has had multiple PCI procedures with cutting balloon  angioplasty to the first diagonal branch and stenting of the right  coronary artery.  He was evaluated by Dr. Tenny Craw in the office, and had  symptoms that were highly suggestive of congestive heart failure with  orthopnea and exertional dyspnea.  He also complained of exertional  angina.  He was referred for right and left heart catheterization to  assess his hemodynamics and coronary anatomy.   The risks and indications of the procedure were reviewed with the  patient.  Informed consent was obtained.  The right groin was prepped,  draped and anesthetized with 1% lidocaine.  Using the modified Seldinger  technique, a 6-French sheath was placed in the right femoral artery and  a  7-French sheath was placed in the right femoral vein.   The right heart catheterization was performed first, using a Swan-Ganz  catheter.  Pressures were recorded from the right atrium to the  pulmonary capillary wedge position.  Oxygen saturations were drawn from  the pulmonary artery and central aorta.  Fick cardiac output was  calculated.  A pigtail catheter was inserted into the left ventricle and  pressures were recorded.  Simultaneous LV and wedge pressures were  recorded.  A left ventriculogram was performed.  A pullback across the  aortic valve was done.  Selective coronary  angiography was then  performed, using standard Judkins catheters.  All catheter exchanges  were performed over a guidewire.  There were no immediate complications.   FINDINGS:  1. Right atrial pressure mean of 5.  2. Right ventricular pressure 22/5.  3. Pulmonary artery pressure 26/11 with a mean of 18.  4. Pulmonary capillary wedge pressure mean of 13.  5. Left ventricular pressure 120/9.  6. Aortic pressure 120/60 with a mean of 84.   Oxygen saturation of pulmonary artery 65, aorta is 97.   Fick cardiac outputs 4.4 liters per minute.  Cardiac index 2.2 liters  per minute per meter squared.   CORONARY ANGIOGRAPHY:  1. The left mainstem is widely patent.  It bifurcates into the LAD and      left circumflex.  2. The LAD is a large-caliber vessel that courses down and wraps      around the LV apex.  The proximal LAD has minor luminal      irregularities with no significant stenoses.  The midportion of the      LAD just beyond a large diagonal branch has a focal 30-40%      stenosis.  Further down  in the mid-LAD there is another focal 20-      30% stenosis.  The remaining portions of the mid and distal LAD are      free of any significant angiographic disease.  There is a large      branching diagonal that is widely patent.  The ostium, which has      been previously intervened upon, has only 20-30% residual stenosis.  3. The left circumflex has minor luminal irregularities throughout the      proximal portion.  It is a large vessel.  There is a 30% stenosis      in the proximal midportion of the circumflex.  There is a very      small first OM branch.  The second OM is of medium size and is      widely patent.  The circumflex courses down and gives off a large      third OM branch, that is also widely patent.  4. The right coronary artery is dominant.  There is a long area of      stented segment throughout the midportion of the vessel.  There is      very minor stenosis within  the stent, that is clearly      nonobstructive; it is no worse than 20%.  The proximal portion of      the vessel has luminal irregularity as well; also no worse than 20-      30%.  Distally, the vessel gives off a PDA branch that is      relatively small.  The posterior AV segment provides one      significant posterolateral branch.  There is no significant disease      in the distal branches of the right coronary artery.   LEFT VENTRICULOGRAM:  Left ventricular function, assessed by left  ventriculography, demonstrates mild left ventricular systolic  dysfunction with anterolateral hypokinesis and mid inferior hypokinesis.  The overall LVEF is estimated at 45%.  There is no mitral regurgitation  seen.   ASSESSMENT:  1. Normal left mainstem.  2. Mild nonobstructive disease in the left anterior descending artery,      with a widely patent diagonal ostium at the site of previous      cutting balloon angioplasty.  3. Mild nonobstructive disease in the left circumflex.  4. Widely patent stent in the right coronary artery, with minor      residual nonobstructive disease.  5. Normal right heart hemodynamics.  6. Mild left ventricular systolic dysfunction, with an LVEF of 45%.   RECOMMENDATIONS:  Mr. Jorge really has an excellent hemodynamic  profile and widely patent coronary arteries.  It is difficult to explain  his symptoms, based on a cardiac etiology.  Would continue with his  current medical therapy, with further follow-up per Dr. Tenny Craw.      Veverly Fells. Excell Seltzer, MD  Electronically Signed     MDC/MEDQ  D:  05/21/2007  T:  05/21/2007  Job:  [id number]

## 2011-03-01 NOTE — Assessment & Plan Note (Signed)
[facility]                            CARDIOLOGY OFFICE NOTE   ARRINGTON, YOHE                      MRN:          [id number]  DATE:09/19/2008                            DOB:          [birth year]    IDENTIFICATION:  [name] is a 77 year old gentleman, who comes in  today for continued cardiac care.   HISTORY OF PRESENT ILLNESS:  The patient has a history of coronary  artery disease.  He is status post PTCA/stent back in December 2002.  At  that time, he had a 95% mid RCA lesion and underwent PTCA stent  placement, a second stent to the RCA.  He had repeat stenting to the RCA  in 2004 and 2007.  He also had cutting balloon angioplasty to a  diagonal.  His last catheterization was back in 2008.  This was a right  and left heart catheterization.  Right heart pressures were normal.  (RA  5, PAP of 26/11, and PCWP of 13).  The LAD had 30-40% mid lesion.  Diagonals were patent.  Circumflex had a 30% proximal lesion.  RCA was  dominant with a very minor stenosis within the stent.   I saw the patient back in May 2009.   Since seen, his health has declined.  He has noted progressive shortness  of breath and progressive fatigability.  He states now if he tries to go  to the mailbox which is not far from his house, he just gives out.  When  he goes to [place], he has to park close, go to a cart and sit.  Again,  this is a marked change and he thinks that it is reminiscent of the  problems he had before his cutting angioplasty.   He does note problems with sleeping.  He attributes it to his being a  mouth breather and now with his teeth out, he is not breathing as well.  He does get some chest tightness at night that goes from the left side  radiating to the back.   He is currently pain free.   ALLERGIES:  DEMEROL, SULFA and ZOCOR.   CURRENT MEDICINES:  1. Plavix 75 daily.  2. Aspirin 325.  3. Imdur 60.  4. Doxazosin 4.  5. Nexium 40.  6.  Norvasc 5.  7. Advair inhaler 2 puffs daily.  8. Pravastatin 80.  9. Singulair 10.  10.Metoprolol 75 b.i.d.  11.ProAir.  12.Nitroglycerin p.r.n.  13.Lasix 20 p.r.n.  14.Albuterol p.r.n.  15.Allegra p.r.n.  16.Neostigmine p.r.n.   PAST MEDICAL HISTORY:  1. Coronary artery disease.  2. Dyslipidemia.  Last lipid panel was done in June.  LDL was 95 with      a particle number of 1085, small particle number is 689; HDL 43;      triglycerides of 83; and total cholesterol of 155.  3. Hypertension.  4. Asthma.  5. Prostatic hypertrophy.  6. History of GE reflux with strictures.  7. History of depression.   SOCIAL HISTORY:  The patient does not smoke and does not drink.   FAMILY HISTORY:  Mother died  of coronary artery disease, had a CABG at  age 17.  Father had diabetes.   REVIEW OF SYSTEMS:  All systems reviewed are negative to the above  problems, except as noted above.   PHYSICAL EXAMINATION:  GENERAL:  The patient is in no distress at rest.  VITAL SIGNS:  Blood pressure is 121/71; pulse is 74 and regular; and  weight 191, stable.  HEENT:  Normocephalic and atraumatic.  EOMI, PERRLA.  Dentition is poor,  missing teeth.  NECK:  JVP is normal.  No thyromegaly or bruits.  LUNGS:  Clear without wheezes or rales.  Moving air okay.  CARDIAC:  Regular rate and rhythm.  S1 and S2.  No S3, S4, or murmurs.  PMI not displaced.  ABDOMEN:  Supple.  Normal bowel sounds.  No masses.  No hepatomegaly.  EXTREMITIES:  Good distal pulses throughout.  No lower extremity edema.  NEURO:  Alert and oriented x3.  Cranial nerves II-XII are grossly  intact.  Moving all extremities.   A 12-lead EKG; normal sinus rhythm, 62 beats per minute.   IMPRESSION:  1. [name] is a 77 year old gentleman with known coronary artery      disease.  Last catheterization was little over a year ago, now with      symptoms that are worrisome for increasing coronary artery disease      with possible ischemia.   Again, he agrees that this reminds him of      how he felt before one of his angioplasties.  What I would      recommend today is that we will check pre-cath labs to make sure he      is not anemic, also to check a thyroid today.  I would keep him on      the same medical regimen and plan for a cardiac catheterization to      redefine his anatomy.  2. Dyslipidemia.  Continue meds for now.  3. History of asthma, continue inhalers.  Again, I do not think this      is the active issue.  4. History of depression.  5. Hypertension, adequate control.     Pricilla Riffle, MD, [facility]  Electronically Signed    PVR/MedQ  DD: 09/19/2008  DT: 09/19/2008  Job #: [id number]   cc:   Bethena Midget

## 2011-03-01 NOTE — Op Note (Signed)
[name], BETHEL NO.:  [id number]   MEDICAL RECORD NO.:  [id number]          PATIENT TYPE:  INP   LOCATION:  3111                         FACILITY:  [facility]   PHYSICIAN:  Stefani Dama, M.D.  DATE OF BIRTH:  04-Apr-1931   DATE OF PROCEDURE:  10/04/2007  DATE OF DISCHARGE:                               OPERATIVE REPORT   PREOPERATIVE DIAGNOSIS:  Lumbar spondylosis and stenosis L2-3 and L3-4,  status post arthrodesis L4-L5.   POSTOPERATIVE DIAGNOSIS:  Lumbar spondylosis and stenosis L2-3 and L3-4,  status post arthrodesis L4-L5.   OPERATION:  Removal of hardware L4-L5, decompression via laminectomy of  L2 and L3, decompression of L2, L3 and L4 nerve roots with decompression  of central spinal canal, posterior lumbar interbody arthrodesis L3-L4  with peak spacers, local autograft and allograft, segmental fixation L2-  L4, posterolateral arthrodesis with local autograft and allograft.   SURGEON:  Dr. Danielle Dess.   FIRST ASSISTANT:  Dr. Maeola Harman.   ANESTHESIA:  General endotracheal.   INDICATIONS:  [name] is a 77 year old individual who has had a  number of surgeries for significant spondylosis in the lumbar spine.  He  had a lumbar laminectomy at L4-L5 with fusion done about 8 or 10 years  ago by Dr. Drema Halon.  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  He now has bilateral leg pain and  weakness and has been advised regarding need for surgical decompression  from L2-L4, taken to the operating room for this procedure.   PROCEDURE:  The patient was brought to the operating room supine on the  stretcher.  After smooth induction of general endotracheal anesthesia,  he was turned prone.  The back was prepped with alcohol and DuraPrep and  draped in a sterile fashion.  Midline incision was made through his  previous scar in the lower lumbar  spine, and dissection was taken down  through the lumbar fascia on either side to expose the old hardware.  The hardware was noted to be TSRH-type pedicle screws with a transverse  connector.  Screw heads were loosened, and gradually pieces were  removed, ultimately removing fairly large pedicle screws at L4 and L5  which measured 7.5 x 50-mm in size.  Decompression was then undertaken  by doing a laminectomy of L2 and L3 and carefully decompressing the  central spinal canal with a substantial amount of thickened and  redundant yellow ligament.  The canal was opened carefully out to the  lateral recesses, and this required sacrifice of the entire inferior  side of L2 and the inferior facet of L3.  The bone in this area was  carefully curettaged, and the lateral gutters were then packed off to  expose the transverse processes from L2 down to L4 on either side.  The  common dural tube was then explored, and the nerve roots were  sequentially decompressed, removing substantial grumous overgrowth of  the L2 nerve roots bilaterally, the L3 nerve roots bilaterally and also  the L4  nerve roots inferiorly.  The thecal sac was decompressed and  mobilized medially on either side.  The disk at the L3-L4 space was  noted be bulging, and by cauterizing epidural veins in this area, I was  able to access the disk space and then do a diskectomy.  It was noted  around this time that there was some significant oozing from the  epidural veins despite being cauterized and then packed off.  This  continued to persist, and I suspected that this may be due to the  patient's being on Plavix.  The patient then was ordered to receive a  platelet transfusion.  The procedure continued by doing the posterior  interbody arthrodesis at the L3-L4 level.  Peak spacers measuring 11 mm  in size were packed with a combination of autograft, allograft and some  infuse bone morphogenic protein substance.  This was packed into  the  interspace, first on the left side and then on the right side.  As were  preparing to do a diskectomy at the L2-L3 space, then the patient had a  substantial drop in his blood pressure.  It was suspected that he may  have a reaction to the platelets, and the transfusion of platelets was  stopped.  The patient then had the area of L2-3 packed off.  It was  decided at this point to forego the interbody arthrodesis at the L2-L3  level as the patient already had a good posterior decompression, and we  proceeded with segmental fixation at L2, L3 and L4 by placing bone  probes in the L2 and L3 spaces.  The 7.5 x 50-mm pedicle screws were  placed in L4 vertebra, 6.5 x 50-mm screws were placed in L2-L3, using  fluoroscopic guidance for proper localization.  Lateral gutters having  been already exposed with transverse processes being decorticated,  strips of Infuse were packed into the lateral gutters along with strips  of Vitoss foam pack and some autologous bone that was harvested from the  lamina that was removed.  This was packed into the lateral gutters, and  then precontoured 80-mm rods were placed between the screw heads, and  screw caps were applied.  Screws were tightened appropriately in the  neutral position and then torqued down to the final resting torque.  Prior to placement of the rods, final AP and lateral localizing  fluoroscopic images were obtained which showed good position of the  pedicle screws.  Each of the pedicle screws was placed by first probing  the pedicle, tapping to a 6.5-mm size and then checking the thresholds  for any cutout.  None was found at any of the holes that were placed.  With the hardware being torqued down to the final position and the  posterolateral gutters being packed with local autograft and allograft  combination, hemostasis in the soft tissues was then checked carefully.  There was noted be some ooze in the soft tissues.  The wound was then   closed over Hemovac drainage placed through two separate stab incisions.  The lumbodorsal fascia was then closed with #1 Vicryl in interrupted  fashion, 2-0 Vicryl was used in the subcutaneous tissues, surgical  staples were placed in the skin.  Hemovac drain was connected to suction  charge.  The patient was then returned to recovery room in stable  condition.  During the procedure, blood loss was estimated at 1200 mL,  and 600 mL of Cell Saver blood was given.  However, only a  total of 400  mL was returned to the patient with the other 200 mL having been  collected after the platelet transfusion.  Two units of cells were also  transfused to the patient.      Stefani Dama, M.D.  Electronically Signed     HJE/MEDQ  D:  10/04/2007  T:  10/05/2007  Job:  [id number]

## 2011-03-01 NOTE — H&P (Signed)
NAMEJACEION, ADAY NO.:  [id number]   MEDICAL RECORD NO.:  [id number]          PATIENT TYPE:  INP   LOCATION:  3703                         FACILITY:  [facility]   PHYSICIAN:  Rod Holler, MD     DATE OF BIRTH:  01/23/1931   DATE OF ADMISSION:  02/06/2008  DATE OF DISCHARGE:                              HISTORY & PHYSICAL   PRIMARY CARDIOLOGIST:  Pricilla Riffle, MD, [facility]   CHIEF COMPLAINTS:  Chest pain.   HISTORY OF PRESENT ILLNESS:  Mr. Honeycutt is a 77 year old male with  history of coronary artery disease, status post stent placement, who  presented to emergency department due to complaints of chest pain.  First episode occurred today around noon, chest pressure and dull ache  in his left chest with no radiation.  There was associated nausea  without any diaphoresis.  This resolved with one sublingual  nitroglycerin.  He had a second episode that was very similar in  characteristic later today that resolved with one sublingual  nitroglycerin.  The patient has complaints of baseline shortness of  breath.  This is at rest.  He also complains of lower extremity edema.  He also has a complaint of PND.  He also has complaints of palpitations  at night when he is sleeping.  Currently, the patient is chest pain  free.   PAST MEDICAL HISTORY:  1. Coronary artery disease, status post MI, status post stent      placement to the diagonal, status post stent placement to RCA, last      cardiac catheterization August 2008 with patent stents.  2. Hypertension.  3. Hyperlipidemia.  4. Asthma.  5. Gastroesophageal reflux disease.  6. Depression.  7. Back surgery   MEDICATIONS:  1. Norvasc 5 mg p.o. daily.  2. Pravachol  80 mg p.o. daily.  3. Albuterol inhaler 2+ b.i.d.  4. Doxazosin 4 mg p.o. daily.  5. Fluticasone 2 puffs b.i.d.  6. Sublingual nitroglycerin p.r.n.  7. Aspirin 325 mg p.o. daily.  8. Imdur 30 mg p.o. daily.  9. Lopressor 50 mg p.o. daily.  10.Nexium 40 mg p.o. daily.  11.Plavix 75 mg p.o. daily.  12.Allegra 180 mg p.o. daily.  13.Lasix 20 mg p.o. daily.   ALLERGIES:  1. DEMEROL  2. SULFA.   SOCIAL HISTORY:  The patient is a nonsmoker.   FAMILY HISTORY:  Mother and father both with history of coronary artery  disease.   REVIEW OF SYSTEMS:  All systems are reviewed in detail and are negative  except as noted in history of present illness.   PHYSICAL EXAMINATION:  VITAL SIGNS:  Heart rate 57, blood pressure  128/75, respiratory rate 18, oxygen saturation 97%.  GENERAL: Well-developed, well-nourished male, alert and oriented x3, no  apparent distress.  HEENT: Atraumatic, normocephalic, pupils equal, round, reactive to  light, extraocular movements intact, poor dentition.  NECK: Supple.  No adenopathy, no JVD, no carotid bruits.  CHEST:  Lungs clear to auscultation bilaterally with equal bilateral  breath sounds.  CARDIAC:  Regular rhythm, bradycardia, no murmurs, rubs or gallops, 1+  peripheral pulses.  ABDOMEN:  Soft, nontender, nondistended.  EXTREMITIES: No clubbing, cyanosis or edema.  NEUROLOGIC:  No focal deficits.   Chest x-ray shows borderline cardiomegaly.  EKG shows sinus bradycardia  with no ischemic changes.   LABORATORY DATA:  White blood cell count 7.6, hematocrit  32.7, platelet  count 235, CK-MB 1.9, troponin less 0.05, myoglobin 110.  Sodium 140,  potassium 4.1, chloride 105, bicarb 9, creatinine 1.1, glucose 120.   IMPRESSION AND PLAN:  Pleasant 77 year old male with known coronary  artery disease who presents with 2 episodes of chest pain today both of  which resolve with one sublingual nitroglycerin, no EKG changes,  negative initial cardiac enzymes.   PLAN:  1. Cardiovascular, admit the patient to telemetry, rule out serial      cardiac enzymes, home cardiovascular medicines, no heparin at this      time, daily EKG, check BNP  in the morning.  2. Pulmonary, home inhalers.  3.  Gastrointestinal, home dose of Nexium.  4. Fluids, electrolytes, and nutrition, n.p.o. after midnight, CMP and      magnesium level in the morning.      Rod Holler, MD  Electronically Signed     TRK/MEDQ  D:  02/06/2008  T:  02/07/2008  Job:  [id number]

## 2011-03-01 NOTE — Discharge Summary (Signed)
NAMELUCERO, IDE NO.:  [id number]   MEDICAL RECORD NO.:  [id number]          PATIENT TYPE:  INP   LOCATION:  3019                         FACILITY:  [facility]   PHYSICIAN:  Coletta Memos, M.D.     DATE OF BIRTH:  [birth year]   DATE OF ADMISSION:  10/04/2007  DATE OF DISCHARGE:  10/14/2007                               DISCHARGE SUMMARY   ADMISSION DIAGNOSIS:  Lumbar spondylosis and stenosis L2-3, L3-4 status  post arthrodesis L4-5.   DISCHARGE DIAGNOSIS:  Lumbar spondylosis and stenosis L2-3, L3-4 status  post arthrodesis L4-5.   PROCEDURE:  Hardware removal L4-5 fusion L2-L4 with pedicle screw  fixation, posterolateral arthrodesis and posterior lumbar interbody  arthrodesis L3-4.   COMPLICATIONS:  None.   DISCHARGE STATUS:  Alive and well.   DISCHARGE DESTINATION:  Home.   ASSESSMENT:  Wound clean, dry, no signs of infection.  He is voiding,  tolerating a regular diet without difficulty.  He has had two bowel  movements prior to discharge.  Strength is full in the upper and lower  extremities.  Sensation is intact.  Mr. Laduca is doing quite well  status post the operation on October 04, 2007.  He will be sent home  with Percocet and Valium for pain control and muscle spasms.  He was  given an instruction sheet for Dr. Verlee Rossetti patients.  He will be seen  in the office in approximately 3-4 weeks.           ______________________________  Coletta Memos, M.D.     KC/MEDQ  D:  10/14/2007  T:  10/15/2007  Job:  [id number]

## 2011-03-01 NOTE — H&P (Signed)
[name], [name] NO.:  [id number]   MEDICAL RECORD NO.:  [id number]          PATIENT TYPE:  INP   LOCATION:  2004                         FACILITY:  [facility]   PHYSICIAN:  Pricilla Riffle, MD, FACCDATE OF BIRTH:  [birth year]   DATE OF ADMISSION:  05/18/2007  DATE OF DISCHARGE:                              HISTORY & PHYSICAL   IDENTIFICATION:  [name] is a 77 -year-old gentleman with a history  of CAD, hypertension, dyslipidemia, asthma, GE reflux with strictures,  depression.  I last saw him back in December.   The patient has noted over the last month or so increasing episodes of  PND.  He will be sleeping and have to sleep propped up, but if there is  no flowing air over him, he will be very, very short of breath.  The  episodes will resolve once he turns the air conditioning on for awhile,  but again, takes a while.   He did not have this 1 year ago, or 6 months ago.   The patient also notes increased fatigueability with exertion.  Note,  yesterday afternoon he was sitting and he developed chest pressure.  He  waited a while, actually several hours, and about midnight took a  nitroglycerin and the episode eased off.   Overall, he is starting to feel like he did before his previous cardiac  intervention.   Currently, he is without pain.   CURRENT MEDICATIONS:  1. Plavix 75 every day.  2. Cardura 4 every day.  3. Albuterol t.i.d.  4. Caduet 580 every day.  5. Imdur 30 every day.  6. Aspirin 325.  7. Metoprolol 75 b.i.d.  8. Allegra 180 every day.  9. Nexium 40 every day.  10.Flovent.   ALLERGIES:  GABAPENTIN, SULFA, DEMEROL.   PAST MEDICAL HISTORY:  1. CAD.  Last cardiac catheterization in 2007, March.  The patient      underwent cutting balloon angioplasty to a diagonal, 80% to 20%.      Other arteries showed left main that was free of disease, LAD had      calcifications, there was a 30 to 40% mid lesion, question 50%,      then diagonal  70%, circumflex was large, OM1 had an 80% narrowing      was then a 30% narrowing.  2. Dyslipidemia.  3. GE reflux with a history of a stricture.  The patient had dilation      done in April.  Followed by Dr. Jarold Motto.  4. History of asthma, though PFTs reportedly okay recently.  5. History of depression.  6. History of BPH.  7. History of gait disorder.  Had been followed by Dr. Sandria Manly.  8. History of nephrolithiasis.  9. The patient is status post MI with bare metal stent to the mid RCA.  10.Cypher stent to the proximal RCA in January 2004.  11.Neuropathy.   SOCIAL HISTORY:  The patient lives in [place].  He is widowed.  He  does not smoke.   FAMILY HISTORY:  Positive for CAD.   REVIEW OF SYSTEMS:  All  systems reviewed and negative for the above  problem except as noted above.   PHYSICAL EXAMINATION:  GENERAL:  On exam, the patient currently is in no  acute distress.  VITAL SIGNS:  Blood pressure is 115/70, pulse is 49 and regular, weight  190 pounds.  HEENT:  Normocephalic, atraumatic.  EOMI.  PERRL.  Mild to clear.  Conjunctivae moist.  NECK:  JVP is normal.  No thyromegaly.  No definite bruits.  LUNGS:  Clear to auscultation.  Moving air.  No wheezes.  CARDIAC EXAM:  Regular rate and rhythm.  S1, S2.  No S3.  No significant  murmurs.  ABDOMEN:  Supple, nontender.  No hepatomegaly.  EXTREMITIES:  No edema.  1+ pulses.   IMPRESSION:  [name] is a 77 year old gentleman with a history  concerning for unstable angina with PND, increased dyspnea with  exertion.  Last night had an episode of chest pain that lasted severe  hours, relieved with a nitroglycerin that he finally took.   He is currently pain free, but I am worried that he may have more  problems from his coronary blood flow and I have recommended that he be  admitted for cardiac catheterization.  I would recommend a right and  left heart catheterization to define his pulmonary pressures as well.  The patient  understands.  He was worried and thought that this may  happen as the symptoms are similar to what he has had before previous  interventions.  We will go ahead and continue other medicines.  Plan to  do this on Monday.  Will check fasting lipids in a.m.   ADDENDUM:  Recent lipid panel showed a total cholesterol of 161, LDL of  80, HDL of 56, triglycerides os 123.  For now would continue on regimen.      Pricilla Riffle, MD, [facility]  Electronically Signed     PVR/MEDQ  D:  05/18/2007  T:  05/19/2007  Job:  [id number]

## 2011-03-01 NOTE — Discharge Summary (Signed)
NAMEJAHEEM, HEDGEPATH NO.:  [id number]   MEDICAL RECORD NO.:  [id number]          PATIENT TYPE:  INP   LOCATION:  2004                         FACILITY:  [facility]   PHYSICIAN:  Veverly Fells. Excell Seltzer, MD  DATE OF BIRTH:  09-13-1931   DATE OF ADMISSION:  05/18/2007  DATE OF DISCHARGE:  05/21/2007                               DISCHARGE SUMMARY   DATE OF BIRTH:  1931-08-23.   DISCHARGE DIAGNOSIS:  Chest pain and shortness of breath.   SECONDARY DIAGNOSES:  1. Coronary artery disease, status post cutting balloon angioplasty to      the diagonal in March of 2007 and previous stenting of the right      coronary artery in 2004.  2. Hyperlipidemia.  3. Gastroesophageal reflux disease with history of esophageal      stricture.  4. Asthma.  5. Depression.  6. Benign prostatic hypertrophy.  7. History of gait disorder, followed by Dr. Sandria Manly.  8. Nephrolithiasis.  9. Peripheral neuropathy.  10.Seasonal allergies.   ALLERGIES:  1. GABAPENTIN.  2. SULFA.  3. DEMEROL.   PROCEDURE:  Left heart cardiac catheterization.   HISTORY OF PRESENT ILLNESS:  This 77 year old male with prior history of  CAD as outlined above, who noted a 78-month history of orthopnea and PND,  as well as dyspnea on exertion.  On the evening prior to admission, he  had sudden onset of chest discomfort lasting several hours.  Eventually,  easing off following several nitroglycerin.  His symptoms concerned him  and presented to [facility] on the evening of August 1 for further  evaluation.  He was admitted.   HOSPITAL COURSE:  Patient ruled out for MI and arrangements were made  for left heart cardiac catheterization.  He has had no additional chest  discomfort over the weekend and underwent left heart catheterization  this morning, revealing a widely patent stent in the LAD and otherwise  nonobstructive disease.  EF was 45% with mild LV systolic dysfunction.  Right heart catheterization was also  performed, revealing normal right  heart pressures.  Post catheterization, been ambulating without return  of discomfort or limitations and will be discharged home today in  satisfactory condition.   DISCHARGE LABS:  Hemoglobin 11.7, hematocrit 34.8, WBC 7.1, platelets  207, MCV 91.9.  Sodium 139, potassium 3.8, chloride 104, CO2 of 31, BUN  11, creatinine 0.93, glucose 107.  PT 13.9, INR of 1.1, PTT 32.  Total  bilirubin 0.5, alkaline phosphatase 85, AST 26, ALT 23, albumin 3.9.  CK  157,  MB 3.32, troponin I is 0.04, total cholesterol 129, triglycerides  118, HDL 45, LDL 60, calcium 9.1, TSH 1.267, BNP 117.0.   DISPOSITION:  Patient is being discharged home today in good condition.   FOLLOWUP:  Plan appointments.  Follow up with Dr. Dietrich Pates August 15  at 4 p.m.   DISCHARGE MEDICATIONS:  1. Aspirin 325 mg daily.  2. Plavix 75 mg daily.  3. Metoprolol 75 mg b.i.d.  4. Nitroglycerin 0.4 mg sublingual p.r.n. chest pain.  5. Albuterol inhaler 2 puffs q.5 hours.  6. Imdur 60 mg daily.  7. Caduet 5/80 mg daily.  8. Cardura 4 mg daily.  9. Nexium 40 mg daily.  10.Allegra 180 mg daily.  11.Hyoscyamine 0.125 mg q.4-6 hours p.r.n.   Outstanding laboratory studies, none.  Duration of discharge encounter,  40 minutes including physician time.      Nicolasa Ducking, ANP      Veverly Fells. Excell Seltzer, MD  Electronically Signed    CB/MEDQ  D:  05/21/2007  T:  05/21/2007  Job:  [id number]

## 2011-03-01 NOTE — Assessment & Plan Note (Signed)
[facility]                            CARDIOLOGY OFFICE NOTE   [name], [name]                      MRN:          [id number]  DATE:03/14/2008                            DOB:          [birth year]    IDENTIFICATION:  [name] is a 77 year old gentleman with CAD (status  post PTCA/stent to diagonal and RCA).  Last catheterization in August  2008 shows patent stents.  I saw the patient last in February.  He went  into the hospital in April.  He was on the phone with his primary  physician and said he had been having chest pain.  She recommended he go  to the emergency room.  He ruled out and was sent home.   Since that time he has been doing okay without chest pain.  Breathing is  okay.  The patient has undergone back surgery in the interval.  The  course was complicated by excess bleeding, Plavix had not been  discontinued.   CURRENT MEDICINES:  1. Include aspirin 325.  2. Plavix 75.  3. Imdur 60.  4. Doxazosin 4.  5. Nexium 40.  6. Norvasc 5.  7. Advair inhaler.  8. Metoprolol 75 daily.  9. Pravastatin 80.  10.Singulair 10.   REVIEW OF SYSTEMS:  The patient has had more problems with his asthma  this spring.   PHYSICAL EXAM:  GENERAL:  The patient is in no distress.  VITAL SIGNS:  Blood pressure 115/63, pulse is 63 and regular, weight 190  up 6 pounds from previous.  LUNGS:  Lungs are clear without rales or wheezes.  CARDIAC:  Exam regular rate and rhythm, S1-S2, no S3, no significant  murmurs.  ABDOMEN:  Supple.  EXTREMITIES:  No significant edema.   IMPRESSION:  1. Coronary artery disease appears to be stable.  Again, last cardiac      catheterization in August 2008 showed mild nonobstructive disease      of the left anterior descending, nonobstructive disease of the      circumflex, widely patent stent in the right coronary artery,      widely patent stent in diagonal, LVEF (left ventricular ejection      fraction) of 45%.   I would continue on medical therapy.  2. Dyslipidemia.  Will check with the [facility] regarding his      last lipid panel.  3. Hypertension, good control.  4. Asthma, on inhalers.   I will set to see the patient back in December, sooner if problems  develop.     Pricilla Riffle, MD, [facility]  Electronically Signed    PVR/MedQ  DD: 03/14/2008  DT: 03/14/2008  Job #: [id number]   cc:   Roda Shutters FM

## 2011-03-01 NOTE — Assessment & Plan Note (Signed)
[facility]                            CARDIOLOGY OFFICE NOTE   FRANZ, SVEC                      MRN:          [id number]  DATE:06/01/2007                            DOB:          01-08-1931    ADDENDUM:  Note that the patient was seen on June 01, 2007, in the  clinic for continued care.  He had requested a referral to neurology.  He has a history of a radiculopathy.  He also has a history of a gait  disorder.  He has been seen in the past by Dr. Genene Churn. Love and would  like to be seen again for the gait problems.   We will see about getting a referral for a visit to re-evaluate.     Pricilla Riffle, MD, [facility]  Electronically Signed    PVR/MedQ  DD: 07/09/2007  DT: 07/10/2007  Job #: [id number]

## 2011-03-01 NOTE — Cardiovascular Report (Signed)
[name], [name] NO.:  [id number]   MEDICAL RECORD NO.:  [id number]          PATIENT TYPE:  OIB   LOCATION:  1961                         FACILITY:  [facility]   PHYSICIAN:  [name] Beals. Juanda Chance, MD, FACCDATE OF BIRTH:  Dec 08, 1930   DATE OF PROCEDURE:  09/22/2008  DATE OF DISCHARGE:  09/22/2008                            CARDIAC CATHETERIZATION   CLINICAL HISTORY:  [name] is 77 years old and has documented  coronary artery disease.  He had stenting of the right coronary artery  in 2002 and in 2004 had another stent placed in the right coronary  artery according to Dr. Tenny [name]' note.  In 2007, he had a cutting balloon  angioplasty of the diagonal branch to the LAD.  Recently, he has felt  symptoms of exertional fatigue which he says is similar what he had  prior to his previous angioplasties.  He was scheduled for evaluation  with angiography today.   PROCEDURE:  The procedure was performed via the right femoral artery and  arterial sheath and 4-French preformed coronary catheters.  A front wall  arterial puncture was performed and Omnipaque contrast was used.  This  aortogram was performed to rule out abdominal aortic aneurysm.  The  patient tolerated the procedure well and left the laboratory in  satisfactory condition.   RESULTS:  Left main coronary artery.  The left main coronary artery is  free of significant disease.   Left anterior descending artery.  The left anterior descending artery  gave rise to a large diagonal branch and two septal perforators.  There  were 30 and 50% stenosis in the proximal LAD.  There was 70% ostial  stenosis of the first large diagonal branch.   Circumflex artery.  The circumflex artery gave rise to a ramus branch,  an atrial branch and two large posterolateral branches.  These vessels  were free of significant disease.   Right coronary artery.  The right coronary artery was a moderately large  vessel that gave rise to a conus  branch, a right ventricular branch, a  posterior descending branch and two posterolateral branches.  There was  a long area of stent in the proximal to mid LAD which probably  represented overlapping stents and there was less than 10% stenosis in  this area.  There were irregularities in the rest of the artery.   Left ventriculogram.  The left ventriculogram performed in the RAO  projection showed good wall motion with no areas of hypokinesis.  The  estimated ejection fraction was 55%.   Distal aortogram.  Distal aortogram was performed which showed two  patent renal arteries on the right and a patent renal artery on the left  and no significant aortoiliac obstruction.   The aortic pressure was 131/69 with a mean of 95, the left ventricular  pressure was 131/29.   CONCLUSION:  Nonobstructive coronary artery disease status post prior  percutaneous coronary interventions with 30% proximal and 50% proximal  stenosis in LAD, 70% ostial stenosis of the first large diagonal branch  at the previous PTCA site, no significant obstruction of circumflex  artery, and less than 10% stenosis in the overlapping stents in the mid  right coronary artery with normal LV function.   RECOMMENDATIONS:  The patient has only nonobstructive disease.  I did  not see any lesions tight enough to explain his symptoms.  I will  discuss the findings with Dr. Tenny [name] and will look for other causes for  his symptoms.      [name] Elvera Lennox Juanda Chance, MD, [facility]  Electronically Signed     BRB/MEDQ  D:  09/22/2008  T:  09/23/2008  Job:  [id number]   cc:   [name] Riffle, MD, [facility]  [name] Midget, MD  Cardiopulmonary Laboratory

## 2011-03-01 NOTE — Assessment & Plan Note (Signed)
[facility]                            CARDIOLOGY OFFICE NOTE   ADRIAAN, MALTESE                      MRN:          [id number]  DATE:06/01/2007                            DOB:          [birth year]    IDENTIFICATION:  Mr. Mottola is a 77 year old gentleman with CAD,  hypertension, dyslipidemia, asthma, gastroesophageal reflux with  strictures, and depression.  I last saw him back earlier this month on  August 1.  When I saw him in the clinic, I was concerned about unstable  angina, and actually, he was admitted and underwent cardiac  catheterization.  Right heart catheterization showed normal right-sided  pressures.  He had mild nonobstructive disease of the LAD on left heart  cath.  LV function was approximately 45%.   Since discharge, the patient has been doing okay.  He was a little weak  initially, but is doing better.  He denies any brackish taste to his  mouth.  He is on the Nexium.   CURRENT MEDICATIONS:  1. Plavix 75.  2. Cardura.  3. Albuterol.  4. Caduet 5/80.  5. Imdur 60.  6. Aspirin 325.  7. Metoprolol 75.  8. Allegra 180.  9. Nexium.  10.Flovent.   PHYSICAL EXAM:  The patient is in no distress.  Blood pressure is 126/79, pulse 61, weight 199.  LUNGS:  Clear.  CARDIAC:  Regular rate and rhythm.  S1, S2.  No S3.  ABDOMEN:  Benign.  EXTREMITIES:  Right groin without hematoma.   IMPRESSION:  1. Chest pain.  I am not sure what the etiology of this is.  I      question if it is not his gastroesophageal reflux, and I have given      him samples of Protonix to try b.i.d. to see if there is an      improvement.  Insurance will not pay for b.i.d. of the Nexium.  2. Coronary artery disease, again, as noted above, seems to be stable.  3. Dyslipidemia on Caduet.  Again, will need to make sure he has      follow ups with his lipids.  4. Hypertension, good control.  5. Recurrent esophageal stricture with candida esophagitis in the     past.   I will set follow up for later in the fall, sooner if problems develop.     Pricilla Riffle, MD, [facility]  Electronically Signed    PVR/MedQ  DD: 06/01/2007  DT: 06/02/2007  Job #: [id number]

## 2011-03-01 NOTE — Assessment & Plan Note (Signed)
[facility]                            CARDIOLOGY OFFICE NOTE   [name], [name]                      MRN:          [id number]  DATE:11/30/2007                            DOB:          [birth year]    IDENTIFYING INFORMATION/JUSTIFICATION FOR ADMISSION AND CARE:  A 77-year-  old Caucasian male with prior history of coronary artery disease, who  presents today secondary to itching, after recently changing cholesterol  medications.   PROBLEM LIST:  1. CAD.  2. Hypertension.  3. Hyperlipidemia.  4. Asthma.  5. GERD with history of strictures.  6. Depression.   HISTORY OF PRESENT ILLNESS:  A 77 year old Caucasian male with above  problem list. Beginning in January of this year, his insurance stopped  paying for his Caduet and he was taken off of Caduet and placed on  Amlodipine 5 mg and Simvastatin 80 mg. He started taking this November 16, 2007 and about 2 days later, began to note itching without  noticeable rash but then the more he scratched, he would note some  inflammation over his arms, chest, back, and posterior thighs and  ankles. He stopped taking the Simvastatin about 2 days ago and has been  feeling a little bit better, although he still has some residual  itching. He presents today for evaluation. He denies any chest pain,  shortness of breath.   HOME MEDICATIONS:  1. Aspirin 325 mg daily.  2. Plavix 75 mg daily.  3. Metoprolol 75 mg b.i.d.  4. Imdur 60 mg daily.  5. Doxazosin 4 mg daily.  6. Nexium 40 mg daily.  7. Norvasc 5 mg daily.  8. Simvastatin 80 mg daily.   PHYSICAL EXAMINATION:  VITAL SIGNS:  Blood pressure 148/81, heart rate  85, respiratory rate 16. Weight 184 pounds, down 15 pounds since last  visit.  GENERAL:  A pleasant white male. No acute distress. Awake, alert, and  oriented x3.  SKIN:  Warm and dry without obvious rash. There is some erythema in  areas that he has been scratching on the left forearm and  over his  bilateral shoulders.  NECK:  No bruits or JVD.  LUNGS:  Respirations are regular and unlabored.  CARDIOVASCULAR:  Regular S1, S3. No S3, S4, or murmurs.  ABDOMEN:  Round, soft, nontender, nondistended. Normal bowel sounds.  EXTREMITIES:  Warm and dry. Pink. No clubbing, cyanosis, or edema.  Dorsalis pedis and posterior tibial pulses 1+ and equal bilaterally.   LABORATORY DATA:  EKG shows sinus rhythm with no acute STT changes.   ASSESSMENT/PLAN:  1. Urticaria, possibly related to switching from Lipitor to      Simvastatin. There seems to be an association between symptom onset      and changing these medications. He was previously on Lipitor and      tolerated this, however, his insurance will not cover this and I      have gone ahead and given him a prescription for Pravachol 80 mg      q.h.s. and recommended that he discontinue the Simvastatin. He has  Allegra at home and I have recommended that he take Allegra daily      and he can take Benadryl at night for the itching. He is to call us      or primary care back, if he does not have resolution of itching      within the next couple of days or if he has worsening on Pravachol.  2. Coronary artery disease. No complaints. Continue current regimen      with the exception of the Stain switch.  3. Hypertension, slightly elevated today, although I am reluctant to      make any additional changes, given his recent itching.  4. Gastroesophageal reflux disease. Continue PPI.   DISPOSITION:  The patient will followup with Dr. Tenny Craw in about 3 months  or sooner if necessary.      [name], ANP  Electronically Signed      Pricilla Riffle, MD, [facility]  Electronically Signed   CB/MedQ  DD: 11/30/2007  DT: 12/02/2007  Job #: [id number]   cc:   Pricilla Riffle, MD, [facility]

## 2011-03-01 NOTE — H&P (Signed)
[name] NO.:  [id number]   MEDICAL RECORD NO.:  [id number]          PATIENT TYPE:  INP   LOCATION:  3111                         FACILITY:  [facility]   PHYSICIAN:  Stefani Dama, M.D.  DATE OF BIRTH:  1930/10/21   DATE OF ADMISSION:  10/04/2007  DATE OF DISCHARGE:                              HISTORY & PHYSICAL   ADMISSION DIAGNOSIS:  Lumbar spondylosis and stenosis L2-3 and L3-4.   HISTORY OF PRESENT ILLNESS:  [name] is a 77 year old white male  who has a significant history of spondylosis, both in the neck and low  back.  Ten years ago, he underwent decompression and fusion by Dr. Drema Halon at [facility] at the L4-L5 level.  Then, about  five or six years ago, I had seen the patient for significant cervical  spondylitic disease with myelopathy and weakness that had developed in  his right side.  He had decompression from C3-C7.  He did well for a  couple years, and then in 2006 he had spondylitic disease at C7-T1 with  a C6 radiculopathy.  He underwent a posterior decompression.  He  tolerated each of the surgeries well.  Now, however, he has been having  increasing back pain and bilateral leg pain and has evidence of severe  spondylosis at L2-3 and L3-4 above level of his fusion.  I visited with  him in the office and advised that ultimately he should undergo surgical  decompression.  He is now admitted for this procedure.   His past medical history is substantial for some breathing difficulty  secondary to COPD.  In addition, he has a history of myocardial  infarction a number of years ago and has had a number of stents placed.   He has been on Plavix and aspirin. His current other medications  include:  1. Hyoscyamine 0.125 mg every 8 hours.  2. Lasix 20 mg a day.  3. NitroQuick 0.4 mg p.r.n.  4. Isosorbide mononitrate daily.  5. Bisoprolol 50 mg half tablet a day.  6. Plavix 75 mg daily.  7. Nexium 40 mg a  day.  8. Flovent and albuterol inhalers  9. Caduet 5/80 mg a day.  10.He also uses doxazosin 4 mg daily.   HE NOTES ALLERGIES TO SULFA WHICH CAUSES A RASH AND DEMEROL GIVES HIM  HALLUCINATIONS.   SOCIAL HISTORY:  The patient is recently widowed, has been living alone  with the help of his daughter.   His physical examination reveals he is an alert, oriented and  cooperative individual who walks very slowly and deliberately.  His  motor strength appears to be good in the iliopsoas, quadriceps, tibialis  and anterior gastroc to confrontation.  His deep tendon reflexes are 1+  in the patellae, absent in the Achilles.  Babinski's are downgoing.  Sensation appears diminished in both ankles and feet, to vibration, he  does have sensation at the level on the knee.  His straight-leg raising  reproduces back pain primarily at 45 degrees in either lower extremity.  Patrick's maneuver is negative bilaterally.  Sensation appears  as noted.  Upper extremity strength and reflexes are good.  Cranial nerve  examination reveals pupils are 4 mm, briskly reactive to light and  accommodation.  Extraocular movements are full, and face is symmetric to  grimace.  Tongue and uvula are midline.  Sclerae and conjunctivae are  clear.  GENERAL PHYSICAL EXAM:  Reveals the lungs are clear to auscultation.  HEART:  Is regular rate and rhythm.  ABDOMEN:  Soft.  Bowel sounds positive.  No masses are palpable.  EXTREMITIES:  Reveal no cyanosis, clubbing or edema.   IMPRESSION:  The patient has evidence of severe spondylitic disease in  the lower lumbar spine.  He is now being admitted to undergo surgical  decompression from L2-L4.      Stefani Dama, M.D.  Electronically Signed     HJE/MEDQ  D:  10/04/2007  T:  10/05/2007  Job:  [id number]

## 2011-03-01 NOTE — Discharge Summary (Signed)
[name], [name] NO.:  [id number]   MEDICAL RECORD NO.:  [id number]          PATIENT TYPE:  INP   LOCATION:  3703                         FACILITY:  [facility]   PHYSICIAN:  Luis Abed, MD, FACCDATE OF BIRTH:  1931-09-14   DATE OF ADMISSION:  02/06/2008  DATE OF DISCHARGE:  02/07/2008                               DISCHARGE SUMMARY   PRIMARY CARDIOLOGIST:  Pricilla Riffle, M.D.   PRIMARY CARE PHYSICIAN:  Not listed.   FINAL DISCHARGE DIAGNOSES:  1. Coronary artery disease.  2. History of myocardial infarction with stent placement.      a.     Last cath, August 2008 with ejection fraction of 45% with       minimal coronary artery disease.  Prior stents are patent.  3. Hypertension.  4. Hyperlipidemia.  5. Asthma.  6. Gastroesophageal reflux disease.  7. Depression.   HOSPITAL COURSE:  This is a 77 year old male with history of CAD, status  post stent with 2 episodes of chest pain, which occurred day of  admission, first episode around noon, which he described as chest  pressure and dull, achy with no associated symptoms.  The patient had a  second episode similar to the first several hours later, which was  resolved with sublingual nitroglycerin.  At this time, the patient did  have some shortness of breath.  The patient came to the emergency room  to be evaluated and was chest pain-free on evaluation.  The patient was  seen and examined by Dr. Joaquim Lai and admitted to rule out myocardial  infarction secondary to the history.  The patient was placed on  medications, which he was taking at home, to have a followup evaluation  by a cardiologist.   The patient had cardiac enzymes cycled, which were found to be negative  x2.  His BNP was 93.  Blood pressure and heart rate were normal.  EKG  revealed no acute abnormalities.  The patient was seen and examined by  Dr. Jerral Bonito on day of discharge and found to be stable.  He will keep  his appointment with  Dr. Dietrich Pates on Mar 14, 2008, at 3 p.m. and  follow up with his primary care physician for continued medical  management.   DISCHARGE LABS:  Magnesium 2.1.  Cardiac enzymes are negative x2 at  0.01.  Sodium 129, potassium 4.2, chloride 106, CO2 27, glucose 100, BUN  7, creatinine 0.97.  BNP 93.  CBC,  hemoglobin 10.9, hematocrit 32.7,  white blood cells 7.6, platelets 235.  EKG revealing normal sinus rhythm  without acute ST-T wave abnormalities.   Chest x-ray revealing borderline cardiac enlargement, minimal tortuous  aorta, pulmonary vascularity normal.  Lungs clear, no bony abnormality  or pneumothorax, prior cervical spine fusion.  Discharge vital signs,  blood pressure 145/72, heart rate 53.   DISCHARGE MEDS:  1. Plavix 75 mg daily.  2. Nexium 40 mg daily.  3. Isosorbide 30 mg daily.  4. Allegra 180 mg daily.  5. Metoprolol 50 mg daily.  6. Lasix 20 mg daily.  7.  Cardura 4 mg daily.  8. Norvasc 5 mg daily.  9. Pravachol 80 mg daily.  10.Albuterol inhaler 2 puffs twice a day.  11.Fluticasone 2 puffs twice a day.   ALLERGIES:  To  DEMEROL and SULFA.   FOLLOWUP PLANS AND APPOINTMENT:  1. The patient is to keep his prior scheduled appointment on Mar 14, 2008, at 3 p.m. with Dr. Dietrich Pates.  2. The patient is to follow up with his primary care physician for      continued management medically and of asthma.  There had been no      change in his medications.   Time spent with the patient to include physician time 30 minutes.      Bettey Mare. Lyman Bishop, NP      Luis Abed, MD, [facility]  Electronically Signed    KML/MEDQ  D:  02/07/2008  T:  02/08/2008  Job:  [id number]

## 2011-03-01 NOTE — Assessment & Plan Note (Signed)
[name]                            CARDIOLOGY OFFICE NOTE   [name], [name]                      MRN:          [id number]  DATE:10/16/2008                            DOB:          1930/11/30    IDENTIFICATION:  [name] is a 77 year old gentleman who I last saw  on September 19, 2008.  At that time, he noted increased fatigability,  increased shortness of breath that was very worrisome for advancing  coronary artery disease.  He was set up for cardiac catheterization.   The patient had this done on September 22, 2008.  This showed the LAD had  a 30-50% proximal stenosis.  There was a 70% ostial stenosis to D1.  The  circumflex had no significant disease.  The RCA had a stent that showed  no significant stenosis.  Plan was for medical therapy.   On talking to him today, he still is short of breath and he is wondering  if it is due to the pulmonary issues.  He is on inhalers.  He would like  to be seen in pulmonary.   His Current medicines include;  1. Aspirin 325.  2. Plavix 75.  3. Imdur 60.  4. Doxazosin 40 daily.  5. Nexium 40.  6. Norvasc 5.  7. Advair inhaler 2 puffs daily.  8. Pravastatin 80.  9. Singulair 10.  10.Metoprolol 75 b.i.d.   PHYSICAL EXAMINATION:  GENERAL:  The patient is in no distress.  VITAL SIGNS:  Blood pressure is 132/64, pulse is 70, weight 189.  NECK:  No JVD.  LUNGS:  Air flow is down some no wheezes.  CARDIAC:  Regular rate and rhythm, S1, S2, no murmurs, no S3.  ABDOMEN:  Benign.  EXTREMITIES:  No edema.  Good distal pulses.   IMPRESSION:  1. Dyspnea.  We will go ahead and set him up to be seen in Pulmonary.      Continue on current regimen for now.  2. Coronary artery disease as noted above.  I would continue medical      therapy, has no critical disease.  3. Dyslipidemia, on pravastatin.  His last lipid panel shows fairly      good control with an LDL of 95, the particle number though was  1085, small particles 689.  I would keep on this for now.  HDL is      43.   I will set to see him back in the summer and follow up with Pulmonary.     Pricilla Riffle, MD, [facility]  Electronically Signed    PVR/MedQ  DD: 10/20/2008  DT: 10/21/2008  Job #: [id number]   cc:   Bethena Midget

## 2011-03-04 NOTE — Discharge Summary (Signed)
NAME:  [name], [name]                         ACCOUNT NO.:  [id number]   MEDICAL RECORD NO.:  [id number]                   PATIENT TYPE:  INP   LOCATION:  2025                                 FACILITY:  [facility]   PHYSICIAN:  Duke Salvia, M.D. [facility]           DATE OF BIRTH:  1931-07-10   DATE OF ADMISSION:  11/13/2002  DATE OF DISCHARGE:  11/16/2002                           DISCHARGE SUMMARY - REFERRING   PROCEDURE:  Cardiac catheterization, January 29.   REASON FOR ADMISSION:  Please refer to dictated admission note.   LABORATORY DATA:  Cardiac enzymes:  Marginally elevated Mb 45 (one set).  Otherwise normal total CPK and normal troponin I (times 3).  Normal CBC.  Normal electrolytes and renal function.  Glucose 131.  Normal liver enzymes.   ADMISSION CHEST X-RAY:  No active disease.   HOSPITAL COURSE:  Patient presented with recurrent chest pain in the setting  of recent re-look coronary angiography revealing a new right coronary lesion  proximal to the previous stent site, requiring treatment with a Cypher  stent.  The initial stent of the mid right coronary artery, however, had  only 30% in stent restenosis. He was thus admitted for rule out of MI and re-  look coronary angiography to exclude early in-stent restenosis.   The cardiac enzymes were negative, save for one marginally elevated MB  fraction.   Patient underwent re-look coronary angiography on January 29 by Dr. Randa Evens, (see full operative report for full details) revealing widely patent  stents of the right coronary artery with otherwise no change in the residual  coronary anatomy.  Dr. Samule Ohm speculated that the patient's pain may have  been due to the small ramus intermedius which has a 70% ostial lesion.  He,  therefore, recommended addition of oral nitrates.  The patient was placed on  30 mg of Imdur.   Patient was kept for overnight observation and monitoring, and was able to  ambulate without any  recurrent chest pain.   No other medication adjustments were made.   MEDICATIONS ON DISCHARGE:  1. Imdur 30 mg daily.  2. Plavix 75 mg daily (as previously directed).  3. Coated aspirin 325 mg daily.  4. Nexium 40 mg daily.  5. Lopressor 25 mg daily.  6. Lipitor 20 mg daily.  7. Cardura 2 mg daily.  8. Theo Dur 400 mg q.d.  9. Albuterol nebulizer as directed.  10.      Nitrostat 0.4 mg p.r.n. instructions.    INSTRUCTIONS:  1. No heavy lifting/driving or strenuous activity times 2 days.  2. Call the office if there is any swelling/bleeding in the groin.  3. Maintain a low fat/cholesterol diet.   Patient has been scheduled to follow up with Dr. Dietrich Pates, on Thursday,  February 12, at 4 pm.   DISCHARGE DIAGNOSES:  1. Known coronary artery disease with recurrent chest pain.     A.  Negative serial cardiac enzymes.     B. Widely patent right coronary artery stent site-Cardiac catheterization        January 29.     C. Status post new stent right coronary artery (proximal) January 2004.     D. Status post non ST-segment elevation myocardial infarction, mid right        coronary artery, December 2002.     E. History of normal left ventricular function.  2. Dyslipidemia.  3. Hypertension.  4. Chronic obstructive pulmonary disease.  5. Ventricular ectopy.  6. History of gastroesophageal reflux disease.     Gene Serpe, P.A. [facility]                      Duke Salvia, M.D. [facility]    GS/MEDQ  D:  11/16/2002  T:  11/16/2002  Job:  [id number]   cc:   Duke Salvia, M.D. [facility], MD  [facility]

## 2011-03-04 NOTE — H&P (Signed)
NAME:  [name], [name]                         ACCOUNT NO.:  [id number]   MEDICAL RECORD NO.:  [id number]                   PATIENT TYPE:  INP   LOCATION:  2032                                 FACILITY:  [facility]   PHYSICIAN:  Pricilla Riffle, M.D.                 DATE OF BIRTH:  April 22, 1931   DATE OF ADMISSION:  11/06/2003  DATE OF DISCHARGE:                                HISTORY & PHYSICAL   PRIMARY CARDIOLOGIST:  Dr. Pricilla Riffle.   PRIMARY CARE PHYSICIAN:  Dr. Gracelyn Nurse.   CHIEF COMPLAINT:  Chest pain.   HISTORY OF PRESENT ILLNESS:  [name] is a very pleasant 77 year old  widowed white male with a history of coronary artery disease, status post  non-Q-wave myocardial infarction in December of 2002 treated with a bare-  metal stent to his RCA.  He subsequently had a CYPHER stent placed to his  RCA proximal to the previous stent in January of 2004.  Re-look  catheterization in March of 2004 revealed patent stents in the RCA and a  small 70% lesion in a ramus intermedius and preserved LV function with an EF  of 55%.  Other disease was noted with a 50% lesion in the diagonal and 20%  lesion in the circumflex.  He has been treated medically since.  He presents  to the office today as an add-on with complaints of left chest discomfort.  This has been ongoing throughout the week and is getting worse.  He rates it  as a 5-to-6/10 at its worst.  He describes it as a tightness.  He can get it  at rest and with exertion.  It does radiate up into his left neck and his  jaw.  He does have associated nausea but no diaphoresis.  He does have  associated shortness of breath and is becoming more short of breath today.  He does get short of breath with exertion as well.  He takes nitroglycerin  and this usually makes it better.  He last took nitroglycerin this afternoon  around 12 o'clock after carrying wood in from outside.  He denies any  syncope or presyncope, orthopnea, paroxysmal  nocturnal dyspnea or edema.  Of  note, he recently had cervical disk surgery at the [place] in November of 2004.   PAST MEDICAL HISTORY:  1. Coronary artery disease, as noted above.  2. He has a history of gait disorder and is followed by Dr. Genene Churn. Love of     neurology.  3. He has a history of:     a. Treated dyslipidemia.     b. Hypertension.     c. Asthma.     d. GERD.     e. Benign prostatic hypertrophy.     f. Renal calculi.  4. He has a history of multiple back surgeries and as noted above, he     recently had  cervical disk surgery in November of 2004.   ALLERGIES:  SULFA and DEMEROL.   CURRENT MEDICATIONS:  1. Nexium 40 mg a day.  2. Plavix 75 mg a day.  3. Cardura 2 mg nightly.  4. Aspirin 325 mg a day.  5. Lopressor 50 mg a half a tablet b.i.d.  6. Lipitor 80 mg nightly.  7. Isosorbide 90 mg daily.  8. Lorazepam 1 mg b.i.d.  9. Budesonide 0.5 mg b.i.d. (nebulizer).  10.      Albuterol nebulizers 3 times a day.  11.      Pulmicort 200 mcg 2 puffs b.i.d.  12.      Nitroglycerin 0.4 mg sublingual p.r.n. chest pain.   SOCIAL HISTORY:  The patient lives in [place] and is a widower.  He has  never smoked and does not drink alcohol.   FAMILY HISTORY:  Family history is positive for coronary artery disease.   REVIEW OF SYSTEMS:  Please see HPI.  He has had increased anxiety recently.  His wife died about 4 years ago and his anxiety is usually worse during the  holidays.  His primary care physician recently put him on Lorazepam.  He  also has a dry, nonproductive cough ever since he had his neck surgery back  in November of 2004 that seems to be getting better.  He also has some  dysphagia associated with that surgery and this seems to be improving as  time goes on.  He has a history of hemorrhoids and occasionally has blood on  the tissue paper but denies any blood on the stool or in the bowl.  He  denies any melena.  He denies any hematuria or dysuria.  He denies  any  fevers, chills, sore throat, headaches, blurry vision, numbness, tingling,  rashes.  The rest of the review of systems is negative.   PHYSICAL EXAMINATION:  GENERAL:  He is well-nourished and well-developed,  not in any acute distress.  VITAL SIGNS:  Blood pressure is 140/86, pulse 63, respirations 15.  Weight  181 pounds.  HEENT:  Head:  Normocephalic, atraumatic.  Eyes:  PERRLA. EOMI.  Sclerae are  white.  ENT:  Oropharynx is pink without exudate.  LYMPH:  Without lymphadenopathy.  ENDOCRINE:  Without thyromegaly.  NECK:  Neck without JVD.  CARDIAC:  Normal S1 and S2, regular rate and rhythm, without murmurs, rubs,  or gallops.  LUNGS:  Lungs are clear to auscultation bilaterally.  ABDOMEN:  Abdomen is soft, nontender, without hepatomegaly.  Normoactive  bowel sounds.  EXTREMITIES:  Extremities without clubbing, cyanosis, or edema.  MUSCULOSKELETAL:  Musculoskeletal without joint deformities noted.  NEUROLOGIC:  Alert and oriented x3, nonfocal.  VASCULAR:  Femoral artery pulses are 2+ bilaterally without bruits.  No  carotid bruits noted bilaterally.   LABORATORY AND ACCESSORY CLINICAL DATA:  Electrocardiogram reveals sinus  rhythm with a heart rate of 62 and no acute changes and occasional PVCs.   IMPRESSION:  1. Unstable angina.  2. Coronary artery disease.     a. Status post non-Q-wave myocardial infarction, December of 2002.     b. History of bare-metal stent to the right coronary artery in December        of 2002.     c. History of drug-eluting stent placed to the right coronary artery in        January of 2004.     d. Patent stent by catheterization, March of 2004, with a 70% lesion in a  small ramus intermedius.  3. Good left ventricular function with an ejection fraction of 55%.  4. Hypertension.  5. Treated dyslipidemia.  6. Degenerative disk disease.     a. Status post multiple back surgeries.    b. Status post recent cervical disk surgery in November  of 2004.  7. Asthma.  8. Gastroesophageal reflux disease.  9. Benign prostatic hypertrophy.  10.      History of renal calculi.   PLAN:  The patient was also seen by Dr. Tenny Craw today, who formulated the  following plan:  We plan to admit the patient from the office directly to  [facility].  We will treat him with aspirin, Lovenox and  nitroglycerin.  If his enzymes are positive or if he develops more pain on  the above therapy, then we will start him on Integrilin.  We plan cardiac  catheterization tomorrow.  The patient agrees with this plan.      Tereso Newcomer, P.A.                        Pricilla Riffle, M.D.    SW/MEDQ  D:  11/06/2003  T:  11/07/2003  Job:  [id number]   cc:   Gracelyn Nurse, M.D.  [address]  [place] [address]  Fax: [phone]

## 2011-03-04 NOTE — Discharge Summary (Signed)
NAMEULYS, FAVIA NO.:  [id number]   MEDICAL RECORD NO.:  [id number]          PATIENT TYPE:  INP   LOCATION:  4729                         FACILITY:  [facility]   PHYSICIAN:  Arturo Morton. Riley Kill, M.D. [facility] OF BIRTH:  October 25, 1930   DATE OF ADMISSION:  12/24/2005  DATE OF DISCHARGE:  12/27/2005                                 DISCHARGE SUMMARY   PROCEDURES:  1.  Cardiac catheterization.  2.  Coronary arteriogram.  3.  Left ventriculogram.   PRIMARY DIAGNOSES:  Chest pain status post cardiac catheterization this  admission with non-obstructive coronary artery disease, medical therapy  recommended and Imdur is new.   SECONDARY DIAGNOSES:  1.  Status post PTCA and stent to the right coronary artery x2 in 2002 and      2004.  2.  Hypertension.  3.  Hyperlipidemia.  4.  Allergy or intolerance to DEMEROL and SULFA.  5.  History of neck surgery x2 and lower back surgery.  6.  History of melanoma.  7.  History of asthma.  8.  History of hiatal hernia as well as reflux and esophageal stricture,      constipation, hemorrhoids, occasional diarrhea.  9.  History of cataracts.  10. Dyslipidemia.  11. Benign prostatic hypertrophy.  12. Renal calculi.  13. Family history of coronary artery disease.   TIME AT DISCHARGE:  41 minutes.   HOSPITAL COURSE:  Mr. Arts is a 77 year old male with a history of  coronary artery disease.  He was seen by Dietrich Pates, M.D. in October 2006  and his medications were adjusted with a decrease in atenolol for  bradycardia.  He came to the hospital on December 24, 2005 for chest pain.  He  also had severe pain in his left calf.  He was admitted for further  evaluation and treatment.   A CT scan was performed which was negative for PE.  His symptoms resolved  with medical therapy.  A cardiac catheterization was scheduled.   Cardiac catheterization was performed on December 26, 2005 and showed a 30% LAD  and 40% LAD.  There was a large  diagonal with a 70% stenosis and a small  ramus with an 80% stenosis.  There was no restenosis in the RCA stent and a  30% proximal RCA stenosis.  Dr. Riley Kill evaluated the films and felt that  Mr. Codrington' best option was medical therapy.  He had Imdur added to his  medication regimen at a low dose.  On December 27, 2005 Mr. Marro was doing  well and ambulated without chest pain or shortness of breath.  He was  evaluated by Dr. Riley Kill and considered stable for discharge with outpatient  follow-up arranged.   DISCHARGE INSTRUCTIONS:  His activity level is to be increased gradually.  He is to call our office for problems with catheterization site.  He is to  stick to a low fat diet.  He is to follow up with Dr. Bethena Midget at  [facility] and he is to follow up with Dr. Tenny Craw' P.A. or  nurse practitioner on  March 26 at 8:15.   DISCHARGE MEDICATIONS:  1.  Cardura 4 mg a day.  2.  Aspirin 325 mg a day.  3.  Plavix 75 mg a day.  4.  Metoprolol 50 mg b.i.d.  5.  Nitroglycerin sublingual p.r.n.  6.  Hyoscyamine 0.125 mg p.r.n.  7.  Albuterol nebulizer b.i.d.  8.  Albuterol inhaler two puffs q.4h. p.r.n.  9.  Advair 250/50 q.12h.  10. Caduet 5/80 daily.  11. Nexium 40 mg b.i.d.      Theodore Demark, P.A. [facility]      Thomas D. Riley Kill, M.D. [facility]  Electronically Signed    RB/MEDQ  D:  12/27/2005  T:  12/28/2005  Job:  [id number]   cc:   Bethena Midget, M.D.  [facility]

## 2011-03-04 NOTE — H&P (Signed)
[name], AMBROSINO NO.:  [id number]   MEDICAL RECORD NO.:  [id number]          PATIENT TYPE:  INP   LOCATION:  6526                         FACILITY:  [facility]   PHYSICIAN:  [name], M.D.   DATE OF BIRTH:  July 07, 1931   DATE OF ADMISSION:  01/02/2006  DATE OF DISCHARGE:                                HISTORY & PHYSICAL   PRIMARY CARDIOLOGIST:  Dr. Dietrich [name].   REASON FOR ADMISSION:  [name] is a 77 year old male, with known  coronary artery disease status post a prior stenting of the right coronary  artery, who was recently hospitalized here at [facility] with chest pain. He  ruled out for myocardial infarction with normal serial cardiac markers and  also had a CT scan of the chest which was negative for pulmonary embolus. He  subsequently was referred for coronary angiography, by Dr. Bonnee [name],  revealing noncritical coronary artery disease with a 70% ostial, large first  diagonal lesion. There was also an 80% proximal ramus intermedius stenosis  but the artery was small. The right coronary artery stents were widely  patent. Left ventriculogram was deferred and the patient does have history  of normal ventricular function by previous catheterization in May 2006.   The patient was discharged home on medical therapy with the addition of low-  dose Imdur (15 milligrams). He was doing well until last Friday, three days  following his discharge, when he developed recurrent midsternal chest pain  radiating to left upper extremity. Of note, this was precipitated by  transient left facial numbness with no other associated focal deficits. The  chest pain was similar to, but not as intense, that which he had during his  recent hospitalization. There was some associated dyspnea, but no  diaphoresis or nausea/vomiting. The patient did take nitroglycerin which  ameliorated the symptoms, but with no complete resolution. In fact, he  reports that the pain has  been constant, and slowly progressive in terms of  pressure, since last Friday and has never fully resolved. It is exacerbated  somewhat by activity and he also notes significant exertional dyspnea since  last Friday.   Given the progressive nature of the discomfort this morning, the patient  contacted our office and was directed to the emergency room. After taking  two nitroglycerin on his own, he received two additional nitroglycerin  tablets per EMS with some improvement. He is currently reporting a 2/10  level of the chest pain and admission EKG shows no acute changes. Initial  markers are negative.   ALLERGIES:  DEMEROL, SULFA.   HOME MEDICATIONS:  1.  Plavix.  2.  Aspirin 325 daily.  3.  Imdur 15 milligrams daily.  4.  Albuterol MDI p.r.n.  5.  Metoprolol 50 milligrams b.i.d.  6.  Nexium 40 milligrams daily.  7.  Doxazosin 4 milligrams daily.  8.  Advair 250/50 milligrams 1 puff b.i.d. p.r.n.  9.  Caduet 5/80 milligrams daily.   PAST MEDICAL HISTORY:  1.  Coronary artery disease.      1.  Status post non-ST elevation MI/bare-metal stenting  mid-RCA December          2002.      2.  Status post Cypher stenting proximal RCA January 2004.      3.  Nonobstructive CAD with widely patent stents  ; normal left          ventricular function by catheterization May 2006.  2.  Hypertension.  3.  Hyperlipidemia.  4.  Asthma.  5.  Gastroesophageal reflux disease.  6.  BPH.  7.  History of gait disorder.      1.  Followed by [name].  8.  History of nephrolithiasis.  9.  Depression/anxiety.  10. Status post multiple back surgeries and cervical disk surgery.   SOCIAL HISTORY:  The patient lives alone in [place]. He has five  grown children. He has never smoked tobacco nor drinks alcohol. He is a  retired Scientist, product/process development.   FAMILY HISTORY:  Mother deceased at age 49, with history of bypass surgery.  Father deceased, complications from a stroke. Patient has two  brothers-one  reportedly status post ICD and the other status post permanent pacemaker.   REVIEW OF SYSTEMS:  As noted per HPI, otherwise unchanged from recent  hospitalization. Remaining systems negative.   PHYSICAL EXAMINATION:  VITAL SIGNS: Blood pressure 148/81, pulse 63 and  regular, respirations 19, temperature 98.7, sats 99% (2 liters).  GENERAL: A 77 year old male in no apparent distress.  HEENT: Normocephalic, atraumatic.  NECK: Preserved bilateral carotid pulses without bruits.  LUNGS: Clear to auscultation all fields.  HEART: Regular rate and rhythm (S1 and S2). No murmurs, rubs or gallops. No  sternal tenderness.  ABDOMEN: Soft, nontender. Intact bowel sounds without bruits.  EXTREMITIES: Palpable bilateral femoral pulses without bruits; intact distal  pulses without edema.  NEUROLOGICAL: Flat affect; alert and oriented x3; cranial nerves II-XII  grossly intact.   Admission chest x-ray: Pending. Admission electrocardiogram: Normal sinus  rhythm at 63 bpm with normal axis; no acute changes.   LABORATORY DATA:  Cardiac enzymes (POC): MB 1.6, troponin I less than 0.05.   IMPRESSION:  [name] is a 77 year old male, with known coronary artery  disease, recently hospitalized here with chest pain who ruled out for  myocardial infarction and had a cardiac catheterization on December 26, 2005  revealing noncritical coronary artery disease. Of note, however, he has  persistent residual 70% proximal first diagonal stenosis which may be the  culprit lesion.   Of note, the patient also had recent exclusion of pulmonary embolus with a  negative chest CT scan.   PLAN:  The patient will be readmitted to telemetry for reassessment of his  recurrent angina pectoris. Serial cardiac markers will be cycled and he will  continue on IV nitroglycerin and heparin which have been initiated in the emergency room. We will otherwise continue his current home medication  regimen, which includes  full-dose aspirin, Plavix, metoprolol, and a Statin.  We will also up titrate Nexium to b.i.d. dosing for prophylaxis treatment of  possible reflux exacerbation.   We will plan on having Dr. Riley Kill review recent films and reassess need for  percutaneous intervention of the diagonal lesion.      Gene Serpe, P.A. [facility]      Thomas C. [name], M.D.  Electronically Signed    GS/MEDQ  D:  01/02/2006  T:  01/03/2006  Job:  [id number]   cc:   Bethena Midget, M.D.  [facility]

## 2011-03-04 NOTE — Discharge Summary (Signed)
[name], KUCH NO.:  [id number]   MEDICAL RECORD NO.:  [id number]          PATIENT TYPE:  INP   LOCATION:  6526                         FACILITY:  [facility]   PHYSICIAN:  Charlies Constable, M.D. [facility] DATE OF BIRTH:  Dec 01, 1930   DATE OF ADMISSION:  01/02/2006  DATE OF DISCHARGE:  01/04/2006                                 DISCHARGE SUMMARY   PRINCIPAL DIAGNOSES:  Unstable angina pectoris.  1.  Status post percutaneous transluminal coronary angioplasty first      diagonal artery January 03, 2006.  2.  Normal serial cardiac markers.   SECONDARY DIAGNOSES:  1.  Coronary artery disease.      1.  Status post non-ST segment elevation myocardial infarction/bare          metal stenting mid right coronary artery December 2002.      2.  Status post CYPHER stenting proximal right coronary artery January          2004.      3.  Nonobstructive coronary artery disease with widely patent stents,          __________ and normal left ventricular function by catheterization          May 2006.  2.  Hypertension.  3.  Hyperlipidemia.  4.  Asthma.  5.  Gastroesophageal reflux disease.  6.  Benign prostatic hypertrophy.  7.  History of gait disorder.      1.  Followed by Dr. Avie Echevaria.  8.  History of nephrolithiasis.  9.  Depression/anxiety.  10. Status post multiple back surgeries/cervical disc surgery.   PROCEDURE:  Coronary angiogram/PTCA 80% first diagonal artery by Charlies Constable, M.D., January 03, 2006.   REASON FOR ADMISSION:  [name] is a 77 year old male, with known  coronary artery disease who was just recently hospitalized here (December 24, 2005, to December 27, 2005) with recurrent angina pectoris, ruled out for  myocardial infarction with normal serial cardiac markers.  He also had a  negative chest CT scan for pulmonary embolus, with subsequent coronary  angiography revealing noncritical CAD with widely patent ICA stents.  Recommendation was to continue medical  therapy and patient was discharged on  low dose Imdur.  However, he returned with recurrent angina and was admitted  for reassessment of his coronary anatomy and consideration of percutaneous  intervention of the known diagonal lesion.   HOSPITAL COURSE:  Patient ruled out for myocardial infarction with normal  serial cardiac markers, underwent uncomplicated coronary angiography on  January 03, 2006, by Dr. Charlies Constable (see procedure), and was cleared for  discharge the following morning in hemodynamically stable condition.   Of note, Dr. Juanda Chance recommended up titration from metoprolol to 75 b.i.d.  given patient's complaint of palpitations.  Patient's heart rate remained in  the 60-70 range and blood pressure was adequate.   DISCHARGE LABORATORY DATA:  Hemoglobin 11.8, hematocrit 34.  Sodium 138,  potassium 3.6, glucose 102, BUN 6, creatinine 1.1.   OUTSTANDING LABORATORY STUDIES:  Normal electrolytes, renal function and  liver enzymes.  Hemoglobin 11.9 on admission.  Cardiac  markers normal.   Admission chest x-ray:  NAD.   MEDICATIONS:  1.  Plavix 75 daily.  2.  Enteric coated aspirin 325 daily.  3.  Nexium 40 daily.  4.  Metoprolol 75 b.i.d.  5.  Doxazosin 4 daily.  6.  Caduet 5/80 daily.  7.  Advair 250/50 b.i.d. p.r.n.  8.  Nitrostat 0.4 p.r.n.   DISCHARGE INSTRUCTIONS:  Patient is referred to cardiac catheterization  discharge sheet.   FOLLOW UP:  Pricilla Riffle, M.D., on January 30, 2006, at 12 noon.   DURATION OF DISCHARGE ENCOUNTER:  Less than 30 minutes.      [name], P.A. [facility]    ______________________________  Charlies Constable, M.D. [facility]    GS/MEDQ  D:  01/04/2006  T:  01/05/2006  Job:  [id number]   cc:   Pricilla Riffle, M.D.  [address]  [address]  [place]  [place] [address]

## 2011-03-04 NOTE — H&P (Signed)
NAME:  [name], [name] NO.:  [id number]   MEDICAL RECORD NO.:  [id number]                   PATIENT TYPE:  INP   LOCATION:  1824                                 FACILITY:  [facility]   PHYSICIAN:  Duke Salvia, M.D. [facility]           DATE OF BIRTH:  1931/01/30   DATE OF ADMISSION:  11/13/2002  DATE OF DISCHARGE:                                HISTORY & PHYSICAL   HISTORY OF PRESENT ILLNESS:  The patient is a 77 year old gentleman with a  history of known coronary artery disease.  He is status post non-Q-wave MI  in December 2002 at which time he received stenting to his mid RCA.  He had  continued problems with chest pain and underwent repeat catheterization in  March 2003 demonstrating modest disease in small vessels, patency of his  RCA, and a negative Cardiolite scan.  He had recurrent chest pain earlier  this month that was nitroglycerin responsive.  He was catheterized with  progressive disease in his RCA proximal to the stent site, and he underwent  re-stenting there with a Cypher stent.   He was discharged and over the last 48 hours has had recurrent chest pain  similar to his recently presenting pain and had not been to responsive to  nitroglycerin, a total of 9 or 10 tablets which he took over the course of  the day today.  He was having discomfort when he arrived in the emergency  room, but he is currently pain free.   PAST MEDICAL HISTORY:  1. Dyslipidemia.  2. Hypertension.  3. COPD.  4. GE reflux disease.  5. BPH.  6. Kidney stones.   PAST SURGICAL HISTORY:  Notable for back surgeries.   ALLERGIES:  SULFA and DEMEROL.   MEDICATIONS:  1. Nexium 40 b.i.d.  2. Plavix 75 mg .  3. Cardura 2 mg.  4. Lopressor 25 mg b.i.d.  5. Lipitor 20 mg.  6. Albuterol nebulizers.  7. Theophylline 200 mg 2 tablets once a day.   FAMILY HISTORY AND SOCIAL HISTORY:  Noted on admission note of 11/01/2002,  are not changed and are not repeated here.   PHYSICAL EXAMINATION:  GENERAL:  Elderly Caucasian male.  VITAL SIGNS:  He was quite hypertensive with blood pressure 175/95, pulse  80.  HEENT:  No scleral icterus or xanthomata.  NECK:  Neck veins were flat.  Carotids were brisk and full bilaterally  without bruits.  BACK:  Without kyphosis or scoliosis.  LUNGS:  Clear.  CARDIAC:  Heart sounds were regular without murmurs or gallops.  ABDOMEN:  Soft with active bowel sounds without midline pulsation or  hepatomegaly.  EXTREMITIES:  Distal pulses were intact.  There was no cyanosis, clubbing,  or edema.  NEUROLOGIC:  Grossly normal.  SKIN:  Warm and dry.   LABORATORY DATA:  Electrocardiogram demonstrated sinus rhythm at 71 with  interval 0.15/0.09/0.37 with PVCs originating inferior aspect  with  morphology consistent with RVOT origin.   IMPRESSION:  1. Recurrent chest pain consistent with unstable angina pectoris.  2. Coronary artery disease.     a. Status post non-Q-wave myocardial infarction December 2002 with        stenting to his right coronary artery.     b. Chest pain in January 2004 with right coronary artery progressive        disease with repeat stenting proximal to prior stent site and residual        left anterior descending artery and small vessel disease.     c. Normal ejection fraction.     d. Negative Cardiolite in March 2003 with left-sided disease similar to        what was described in January 2004.  3. Hypertension.  4. Hyperlipidemia.  5. Chronic obstructive pulmonary disease.  6. Premature atrial contractions with probable right ventricular outflow     tract origin.   DISCUSSION:  The patient has recurrent chest pain.  It is certainly most  likely that this represents progressive coronary disease potentially in the  context of an inflammatory substrate.  I wonder also, just for the sake of  speculation, as to if his ventricular ectopy may be contributing to his  chest pain.   However, given his known  disease and the concern for guarding his LAD at  last catheterization, we will plan to admit him and treat him as an acute  coronary syndrome.  We will defer to Dr. Riley Kill and Dr. Tenny Craw in the morning  for next step in terms of diagnosis as to whether Cardiolite scanning to  look at the functional nature of the LAD disease versus catheterization  would be most appropriate.  I do not think this issue is involving the RCA  given the electrocardiogram.                                                 Duke Salvia, M.D. [facility]    SCK/MEDQ  D:  11/13/2002  T:  11/13/2002  Job:  [id number]   cc:   Budd Palmer, M.D.  [place], [place]

## 2011-03-04 NOTE — Discharge Summary (Signed)
[place]  Patient:    [name], [name] Visit Number: [id number] MRN: [id number]          Service Type: MED Location: [phone] Attending Physician:  Talitha Givens Dictated by:   Tereso Newcomer, P.A.-C. Admit Date:  09/24/2001 Discharge Date: 09/26/2001   CC:         [name], M.D. [facility]   Discharge Summary  DATE OF BIRTH: 1931-03-18  DISCHARGE DIAGNOSES: 1. Non-ST elevation myocardial infarction.    a. Status post stenting to the right coronary artery this admission       with reduction of stenosis of 95% to 0%.    b. Peak CK-MB 427 and peak troponin I of 3.89 this admission. 2. Coronary artery disease.    a. Cardiac catheterization this admission with left anterior descending       70% proximal, 50% mid circumflex, 90% intermediate, right coronary       artery 95% mid. 3. History of asthma. 4. History of melanoma resection. 5. History of back surgery x5. 6. Gastroesophageal reflux disease.  HOSPITAL COURSE: This 77 year old male presented to the emergency room on September 24, 2001, with complaints of severe 10/10 substernal chest pain radiating to his left arm associated with diaphoresis and shortness of breath. This occurred after exertion. At that time he had a history of nonobstructive CAD by catheterization in February of 2000. His pain was not relieved with nitroglycerin x3.  Upon initial evaluation in the emergency room he was pain-free on IV nitroglycerin and morphine. His ECG revealed a heart rate of 68, normal sinus rhythm with inferior J point elevation. He was placed on IV heparin and nitroglycerin. Later that morning his enzymes returned positive with a troponin of 1.89. At that time, he was placed on Integrilin. Plavix as withheld. Aspirin was continued and he was placed on beta blockers. The patient continued to have 2/10 pressure despite all the medical therapy given. Therefore he was taken to the  cardiac catheterization lab. Dr. Juanda Chance performed the procedure and he had the above-noted results. He was doing well on December 11 without further complaints of chest pain and his groin was stable. It was decided to keep him for observation for another 24 hours. On the morning of December 12 he was found to be without any complaints and was ready to go home. His right groin was without hematoma or bruit. It was felt the patient was stable enough for discharge to home.  He would followup in the clinic in a couple of weeks. He would continue on Plavix. He would need assistance with affording his Plavix and samples would be arranged at the office. A lipid panel is pending at the time of this dictation.  LABORATORY DATA: Sodium 139, potassium 3.7, chloride 105, CO2 28, glucose 101, BUN 6, creatinine 1.0. White blood cell count 8500, hemoglobin 12.3, hematocrit 35.7, platelet count 200,000. INR 1.1. Total protein 6.5, albumin 3.6. AST 19, ALT 17. Alkaline phosphatase 76. Total bilirubin 0.6. As noted above, peak CK 427, CK-MB 34.8 and troponin I of 3.89. TSH 1.239.  DISCHARGE MEDICATIONS: 1. Nexium 40 mg q.d. 2. Coated aspirin 325 mg q.d. 3. Plavix 75 mg q.d. 4. Lopressor 25 mg b.i.d.  The patient was advised to stop taking his doxazosin.  ACTIVITY: No driving, lifting, strenuous activities, or sexual intercourse for 2 days.  DIET: Low salt, fat, cholesterol.  WOUND CARE: The patient should not soak in a bath  for 2 days and call our office for any swelling, redness or discomfort.  FOLLOWUP: He will see the physician assistant for Dr. Tenny Craw on December 27 at 11 a.m. Dictated by:   Tereso Newcomer, P.A.-C. Attending Physician:  Talitha Givens DD:  09/27/01 TD:  09/27/01 Job: [id number]

## 2011-03-04 NOTE — Cardiovascular Report (Signed)
[name], [name] NO.:  [id number]   MEDICAL RECORD NO.:  [id number]          PATIENT TYPE:  INP   LOCATION:  4729                         FACILITY:  [facility]   PHYSICIAN:  [name], M.D. [facility] OF BIRTH:  Jul 10, 1931   DATE OF PROCEDURE:  12/26/2005  DATE OF DISCHARGE:                              CARDIAC CATHETERIZATION   INDICATIONS:  [name] is a 77 year old who has previously undergone  stenting of the right coronary artery. He presented with recurrent chest  pain. Enzymes were negative. Current study was done to assess coronary  anatomy. He has had multiple recurrent cardiac catheterizations. He has had  demonstrated both disease of the LAD and diagonal as well as the  intermediate vessel.   PROCEDURE:  Selective coronary arteriography.   DESCRIPTION OF PROCEDURE:  The patient was brought to the cath lab and  prepped and draped in the usual fashion. Through an anterior puncture, the  right femoral artery was entered. A 5-French sheath was placed. We then  performed coronary arteriography and took special views to better lay out  the left anterior descending artery. He tolerated the procedure without  complication. ACT was checked. He was taken to the holding area in  satisfactory clinical condition.   HEMODYNAMIC DATA:  1.  Central aortic pressure was 111/56, mean 84.  2.  Angiographic data: The right coronary artery is a large-caliber vessel.      There is about 30% narrowing in the proximal mid-junction. Beyond this,      there is previously placed Cypher stents in the mid-vessel that      demonstrate no evidence of significant recurrence. Posterior descending      and posterolateral branches are without critical disease.  3.  The left main is short and free of critical disease.  4.  The LAD courses to the apex. There is calcification of the LAD. The LAD      demonstrates about 30-40% narrowing in the mid-vessel after the takeoff  of the diagonal. Importantly, we did a left lateral view to best lay out      the LAD diagonal to demonstrate noncritical disease. In some views,      there was some hypodensity but in the left lateral view there appeared      to be 50% or less luminal reduction. The LAD also appeared to be smooth      in this view. Importantly, the diagonal, as on previous studies,      demonstrates a 70% segmental stenosis associated with some proximal      calcification.  5.  The circumflex is a fairly large-caliber vessel. There is a first obtuse      marginal or ramus intermedius that has about 80% proximal narrowing but      is a very small caliber vessel. The circumflex then opens up and      provides an AV circumflex and there is about 30% narrowing after the AV      circumflex takeoff. The vessel then bifurcates into a bifurcating      marginal branch which goes  out to the anterolateral segment, and this      was free of critical disease.  6.  No ventriculography was performed.   CONCLUSION:  1.  Continued patency of the right coronary artery with continued wide      patency of the previously placed stents.  2.  No significant change in the ramus intermedius stenosis or circumflex      vessel.  3.  Moderate stenosis in the mid-left anterior descending artery and      significant stenosis of the first diagonal, although this appears to be      unchanged, and is not ideal for percutaneous intervention largely      because of a narrow angle between the left anterior descending artery      and diagonal and it does not appear to be critical. Therefore, medical      therapy likely would be the best option.   DISPOSITION:  The patient will be taken back to his room and heparin  discontinued. We will lean towards medical therapy.      [name], M.D. [facility]  Electronically Signed     TDS/MEDQ  D:  12/26/2005  T:  12/26/2005  Job:  [id number]   cc:   [name], M.D.  Fax: [phone]    [name], M.D.  [address]  [address]  [place]  [place] [address]   CV Laboratory   Patient's medical record

## 2011-03-04 NOTE — Assessment & Plan Note (Signed)
[name]                         GASTROENTEROLOGY OFFICE NOTE   [name], [name]                      MRN:          [id number]  DATE:01/09/2007                            DOB:          [birth year]    [name] is having recurrent dysphagia despite being on Nexium.  He has a  long history of recurrent esophageal strictures but also had Candida  esophagitis on his last endoscopy in October 2006.  He is on multiple  medications.  List reviewed in his chart which include Plavix and  aspirin.  He has a long history of coronary artery disease,  hypertension, dyslipidemia and asthma.  All of his medications are  listed and reviewed at this time.  He is followed by Dr. Dietrich Pates and  felt to be stable in terms of his cardiovascular issues.  He has had  previous myocardial infarction and stenting on multiple occasions in  2002, 2004 and 2007.  He denies lower GI or hepatobiliary problems at  this time.   He weighs 183 pounds and blood pressure 110/64.  Pulse was 58 and  regular.  Examination of the neck and oropharynx was unremarkable.   ASSESSMENT:  I suspect that [name] has recurrent peptic strictures of his  esophagus verses Candida esophagitis which he has gotten in the past  from inhaled steroid use, which he is back on at this time.   RECOMMENDATIONS:  1. Outpatient endoscopic exam.  2. Trial of Mycostatin mouth wash 10 cc q.i.d.  3. Continue other medications listed, reviewed in his chart, including      his Plavix and aspirin.     [name], MD, Caleen Essex, FAGA  Electronically Signed    DRP/MedQ  DD: 01/09/2007  DT: 01/09/2007  Job #: [id number]

## 2011-03-04 NOTE — Cardiovascular Report (Signed)
NAMEDALTEN, AMBROSINO NO.:  [id number]   MEDICAL RECORD NO.:  [id number]          PATIENT TYPE:  INP   LOCATION:  6526                         FACILITY:  [facility]   PHYSICIAN:  Charlies Constable, M.D. [facility] DATE OF BIRTH:  1931-05-08   DATE OF PROCEDURE:  01/03/2006  DATE OF DISCHARGE:  01/04/2006                              CARDIAC CATHETERIZATION   PROCEDURE:  Percutaneous coronary intervention.   CLINICAL HISTORY:  Mr. Kothari is 77 years old and has had prior stenting of  the right coronary artery.  He recently was admitted with unstable angina  and was studied by Dr. Riley Kill and was found to have continued patency of  the previously-placed stents.  He had 80% stenosis of a very small ramus  branch and 70-80% stenosis of a large diagonal branch. The diagonal branch  was borderline in severity and was not very favorable for intervention, and  medical therapy was recommended.  He returned with recurrent chest pain  yesterday and was scheduled for possible percutaneous coronary intervention  of the diagonal branch today.   The procedure was performed via the right femoral artery using an arterial  sheath and a 6 [place] Q-4 guiding catheter with side holes.  The patient was  given Angiomax bolus and infusion and was given an extra 300 mg of Plavix.  He had been on Plavix prior to the procedure.  We passed the Prow[place] wire  across the ostial lesion in the diagonal branch and down the diagonal branch  without difficulty.  We initially dilated with a 2.5 x 10 mm cutting  balloon, performing three inflations up to 8 atmospheres for 30 seconds.  We  then upgraded to a 3.0 x 10 mm cutting balloon and performed three  inflations up to 6 atmospheres for 30 seconds.  Final diagnostic studies  were then performed through the guiding catheter.  The patient tolerated the  procedure well and left the laboratory in satisfactory condition.   RESULTS:  Initially the stenosis in the  ostium of the diagonal branch was  estimated at 80%.  Following cutting balloon angioplasty, this improved to  20%.   CONCLUSION:  Successful percutaneous coronary intervention of the lesion in  the ostium of a large diagonal branch of the left anterior descending artery  using a cutting balloon, with improvement in the percent __________  narrowing from 80% to 20%.   DISPOSITION:  The patient was returned to the post-angiography unit for  further observation.           ______________________________  Charlies Constable, M.D. [facility]     BB/MEDQ  D:  01/03/2006  T:  01/04/2006  Job:  [id number]   cc:   Bethena Midget, M.D.  [facility]   Pricilla Riffle, M.D.  [address]  [address]  [place]  [place] [address]   Cardiopulmonary Lab

## 2011-03-04 NOTE — Discharge Summary (Signed)
[place]  [name]:    [name], [name] Visit Number: [id number] MRN: [id number]          Service Type: MED Location: [phone] Attending Physician:  Levy Sjogren Dictated by:   Jennette Kettle, M.D. Admit Date:  01/12/2002 Discharge Date: 01/13/2002   CC:         Dietrich Pates, M.D. [facility]  Dr. Laveda Abbe, [facility], [place], [place]   Discharge Summary  DISCHARGE DIAGNOSES: 1. Renal colicky pain secondary to kidney stones status post passage    of stone during current hospitalization. 2. History of chronic kidney stones (greater than 20+ years, previously    evaluated by urologist at [facility], [place], and [place], per    the patients statements). 3. History of coronary artery disease status post myocardial infarction in    December 2002.    a. The [name] had percutaneous transluminal coronary arthroplasty and       stenting by [facility] at that time.    b. The [name] had re-catheterization on December 28, 2001, for angina.       At that time, the [name] had overlying 60-70% nonobstructive       lesions.  The patients ejection fraction was 55%. 4. History of hypertension. 5. History of asthma. 6. History of gastroesophageal reflux disease. 7. History of multiple back surgeries. 8. History of benign prostatic hypertrophy.  DISCHARGE MEDICATIONS:  The following medications are new: 1. Tums 2 tablets p.o. b.i.d. (for chronic kidney stones). 2. Hydrochlorothiazide 25 mg p.o. q.d. (for chronic kidney stones).  The [name] is to continue taking the following pre-hospitalization medications:  1. Zocor 20 mg p.o. q.h.s.  2. Altace 2.5 mg p.o. q.d.  3. Nexium 40 mg p.o. b.i.d., then q.d. as previously prescribed     after one month total of b.i.d. dosing.  4. Lopressor 25 mg p.o. b.i.d.  5. Nitrostat 0.4 mg p.o. p.r.n. for chest pain.  6. Oxycodone 5 mg p.o. q.6h. p.r.n. pain.  7. Aspirin 81 mg p.o.  q.d.  8. Plavix 75 mg p.o. q.d.  9. Cardura 2 mg p.o. q.h.s. 10. Albuterol p.r.n. 11. Pulmicort inhaler  FOLLOWUP: 1. The [name] is to continue to follow up with the patients cardiologist,    Dr. Tenny Craw, as previously scheduled. 2. The [name] is to follow up with the [name] primary care physician,    Dr. Laveda Abbe, as previously scheduled.  PROCEDURES/DIAGNOSTIC STUDIES:  The [name] had an abdominal and pelvic CT performed on January 12, 2002, in the emergency room with impression being bilateral renal calculi, moderate left hydronephrosis with a single intrarenal calculus on the left within a mid-infundibulum.  There was acute stranding around the left kidney, likely on the basis of extravasation of urine.  There was left-sided obstruction due to a 4 mm calculus at, or just beyond the left UVJ.  Multiple small punctate calculi of the right kidney.  The left kidney calculus measured 4.5 x 8 mm.  The UVJ calculi measured approximately 4 mm. Otherwise, normal CT.  ADMIT HISTORY AND PHYSICAL:  Briefly, [name] is a 77 year old white male with a history of chronic kidney stones greater than 20 years, coronary artery disease, hypertension, asthma, GERD, BPH, who presented to the emergency room in the morning of the 29th of March, complaining of abdominal pain and left lower back pain.  The [name] had been having this pain for three days.  The [name] was seen by his primary care physicians  office on Thursday, the 27th, and was given a "shot" as well as penicillin and was to take oxycodone at home.  The [name], at that time, stated that he had "red wine" looking urine, but that has since resolved at the time of admission.  The [name] states that he went home after being seen at the primary care physicians office, and was feeling better until approximately 12 hours after being at home, began having pain.  This subsequently progressed to the morning of Saturday, the 29th, and the  [name] went to the emergency room.  The [name] denies any dysuria, any fevers, or chills.  The [name] did have some nausea and vomiting, unable to keep down fluids for 24 hours prior to admission.  The [name] states that he has previously had his kidney stones analyzed and was told he had calcium in his stones.  Of note, there are no records of previous documentation of the pathology of his kidney stones.  The [name] denies any chest pain, shortness of breath, or bowel changes.  ADMIT LABORATORIES:  The patients CT scan as above.  In addition to that, the [name] electrolytes revealed sodium 136, potassium 3.9, chloride 100, bicarb 26, glucose 105, BUN 5, creatinine 1.3, calcium 9.4.  The patients CBC revealed a white count of 11.3, hemoglobin 14.1, platelets 196 with a normal differential.  The patients urinalysis was negative for glucose, bilirubin, ketones, protein, nitrites, and leukocytes.  It did show small blood with 0.2 red blood cells per high-power field only.  HOSPITAL COURSE: #1 - RECURRENT CHRONIC KIDNEY STONES:  The [name] was initially admitted for observation secondary to his nausea and vomiting, as well as his pain.  The [name] was given a Demerol shot in the emergency room.  This subsequently relieved much of his pain.  The [name] did pass his one stone while in the hospital.  This was likely the one in the left ureterovesical junction.  This stone was sent for pathology and the final report is not back.  The [name] has a history of chronic recurrent kidney stone for which he usually is outpatient management pain relief.  The [name] was placed on hydrochlorothiazide as well as TUMS for his kidney stones.  Taking calcium supplements via TUMS would help bind oxalate in the gut and help prevent calcium oxalate crystals theoretically in the urine.  The [name] is to take hydrochlorothiazide for similar principle for decreasing calcium deposition in  the kidneys.   The [name] was previously not on any diuretic.  The [name] will continue the same heart medications per [facility], Dr. Tenny Craw.  Of note, the [name] was previously on penicillin whenever he was admitted to the hospital, and he was given this at his primary care physicians office for his kidney stones.  Penicillin is not recommended as it gives good coverage for gram-positives, but urinary tract infections most likely are gram-negative organisms.  The [name] was started on Rocephin for one day while in the hospital and remained afebrile with mildly elevated white count, but normal differential.  The [name] was discharged home on no antibiotics.  The [name] did have a CT scan while in the hospital with results as above.  The patients creatinine did bump to a level of 1.3 on admission.  Of note, it was between 0.9 and 1.1, recently, for his cardiac heart catheterization.  The patients discharge creatinine was 1.2.  The [name] was urinating well.  The [name] was IV hydrated with greater than 3 L  in while in the hospital.  The [name] was without pain and stable for discharge upon his discharge date, hospital day #2 total.  #2 - HISTORY OF CHRONIC KIDNEY STONES:  The [name] was previously worked up by urologists in the past.  Further evaluation with the urologist would be recommended for possible lithotripsy of his left kidney stones, measures 4.5 x 8 mm if this becomes symptomatic.  Further evaluation by the patients outpatient physician.  #3 - HISTORY OF CORONARY ARTERY DISEASE:  We will continue the [name] on previous medications as outlined in the discharge medication list.  The addition of hydrochlorothiazide was made for his kidney stones.  Of note, the patients potassium was 4.3 at the time of discharge.  DISCHARGE LABORATORIES:  On the day of discharge, the [name] had electrolytes revealing sodium 138, potassium 4.3, chloride 110, bicarb 27, glucose 111, BUN 6,  creatinine 1.2, calcium 8.7.  The CBC revealed a white count of 6.0, hemoglobin 12.0. platelets of 174.  (The patients white count is decreased from 11.3 down to 6.0 in one day, may represent just inflammatory changes related to his kidney stones). Dictated by:   Jennette Kettle, M.D. Attending Physician:  Levy Sjogren DD:  01/13/02 TD:  01/14/02 Job: [phone]

## 2011-03-04 NOTE — Cardiovascular Report (Signed)
NAME:  [name], [name]                         ACCOUNT NO.:  [id number]   MEDICAL RECORD NO.:  [id number]                   PATIENT TYPE:  INP   LOCATION:  2025                                 FACILITY:  [facility]   PHYSICIAN:  Salvadore Farber, M.D. [facility]         DATE OF BIRTH:  [birth year]   DATE OF PROCEDURE:  11/14/2002  DATE OF DISCHARGE:                              CARDIAC CATHETERIZATION   PROCEDURE:  Coronary angiography.   INDICATIONS:  The patient is a 77 year old gentleman, status post stenting  of his RCA 10 days ago. He now presents with recurrent chest discomfort.  Cardiac enzymes were normal and electrocardiogram was unchanged from  previous.  However, due to his recent coronary intervention and chest pain,  he is referred for diagnostic angiography to exclude stent thrombosis.   DIAGNOSTIC TECHNIQUE:  Informed consent was obtained. Under 1% lidocaine  local anesthesia, a 6 French sheath was placed in the right femoral artery  using the modified Seldinger technique.  Diagnostic angiography was  performed using JL4 and JR4 catheters.  The patient tolerated the procedure  well and was transferred to the holding room in stable condition.  The  sheaths are to be removed there.   COMPLICATIONS:  None.   FINDINGS:  1. Left main:  Angiographically normal.  2. LAD:  The LAD is a moderate sized vessel giving rise to a single large     diagonal branch.  The mid LAD has a 30% stenosis just after the takeoff     of the diagonal.  The diagonal has a 50% ostial stenosis.  Both of these     are unchanged from prior.  3. Ramus intermedius:  The ramus is a very small (less than 1 mm) vessel.     There is a 70% stenosis of the ostium which is unchanged from previous.  4. Circumflex:  The circumflex is a large vessel giving rise to two obtuse     marginal branches.  The proximal vessel has diffuse mild disease.  5. RCA:  The RCA is a moderate sized, dominant vessel.  There is a 30%   stenosis of the proximal vessel, which is unchanged from previous.  The     overlapping stents in the mid vessel are widely patent with TIMI-3 flow     to the distal vessel.   IMPRESSION/RECOMMENDATIONS:  Widely patent right coronary artery stents.  No  change is his other coronary disease.  His pain may be due to the small  ramus intermedius.  We will therefore advance nitrate therapy and work on  blood pressure control.                                                      Salvadore Farber, M.D.  [facility]    WED/MEDQ  D:  11/14/2002  T:  11/14/2002  Job:  [id number]   cc:   Pricilla Riffle, M.D. [facility]  [address]  [place]  [place] [address]  Fax: 1   Charlaine Dalton. Sherene Sires, M.D. [facility]  [address]  [place]  [place] [address]  Fax: 1   [place], Dr.  Orrin Brigham

## 2011-03-04 NOTE — Cardiovascular Report (Signed)
[place]  Patient:    [name], [name] Visit Number: [id number] MRN: [id number]          Service Type: MED Location: [phone] Attending Physician:  Nathen May Dictated by:   Veneda Melter, M.D. Proc. Date: 12/28/01 Admit Date:  12/26/2001 Discharge Date: 12/28/2001   CC:         Casimiro Needle B. Sherene Sires, M.D. [facility]  Dietrich Pates, M.D. [facility]   Cardiac Catheterization  PROCEDURES PERFORMED: 1. Left heart catheterization. 2. Left ventriculogram. 3. Selective coronary angiography.  DIAGNOSES: 1. Coronary artery disease. 2. Normal left ventricular systolic function.  INDICATIONS: The patient is a 77 year old white male with a known history of coronary artery disease who has recently undergone percutaneous intervention with stent placement in the mid right coronary artery for acute coronary syndrome with evidence of a thrombotic high-grade lesion. The patient had done well after discharge. However, he was readmitted with substernal chest discomfort. He has continued to have pain despite anticoagulants and antianginal therapy. Serial ECGs and cardiac enzymes were negative for injury. He presents for further cardiac assessment.  TECHNIQUE: After informed consent was obtained, the patient was brought to the cardiac catheterization lab where a 6 French sheath was placed in the right femoral artery using the modified Seldinger technique. The 6 [place] and JR4 catheters were then used to engage the left and right coronary arteries and selective angiography performed in various projections using manual injection of contrast. The 6 French pigtail catheter was then advanced in the left ventricle and left ventriculogram performed using power injections of contrast. The JL4 catheter was then reintroduced and additional imaging of the left coronary artery performed after the administration of intracoronary nitroglycerin. At the termination of the  case, the catheters and sheath were removed and manual pressure applied until adequate hemostasis was achieved. The patient tolerated the procedure well and was transferred to the ward in stable condition.  FINDINGS: Findings are as follows: 1. Left main trunk: The left main trunk is a large caliber vessel with    mild irregularities. 2. LAD: This is a medium caliber vessel that provides a bifurcating first    diagonal branch in the proximal segment. The LAD has mild irregularities    in the proximal segment. There is concentric borderline lesion of    60% in the mid section of the vessel after the first diagonal branch.    Further focal narrowing of 50% is noted in the distal vessel. The    first diagonal branch is a small caliber vessel that bifurcates in its    mid section. There is a lesion of 70% at the ostium. 3. Left circumflex artery: This again is a large caliber vessel and provides    the first marginal branch in the mid section. The AV circumflex has mild    diffuse disease of 30%. 4. Ramus intermedius: This is a small caliber vessel with an ostial narrowing    of 70%. 5. Right coronary artery is dominant. This is a medium caliber vessel that    provides the small posterior descending artery and posterior ventricular    branch in its terminal segment. The right coronary artery has mild disease    of 30% in the proximal and mid section. There is evidence of previously    placed stent in the mid section of the vessel with only mild    irregularities.  LEFT VENTRICULOGRAM: Normal end-systolic and end-diastolic dimensions. Overall left ventricular function  is well preserved, ejection fraction of greater than 55%. No mitral regurgitation. LV pressure is 130/5, aortic is 130/60. LVEDP equals 11.  ASSESSMENT AND PLAN: The patient is a 77 year old gentleman with borderline disease of the left anterior descending. This appears to be noncritical in nature and continued medical  therapy should be pursued. Further assessment with a functional study may be beneficial to determine if he has any psychologic compromise of flow. Dictated by:   Veneda Melter, M.D. Attending Physician:  Nathen May DD:  12/28/01 TD:  12/29/01 Job: [id number]

## 2011-03-04 NOTE — Op Note (Signed)
NAME:  [name], [name]                         ACCOUNT NO.:  [id number]   MEDICAL RECORD NO.:  [id number]                   PATIENT TYPE:  INP   LOCATION:  3101                                 FACILITY:  [facility]   PHYSICIAN:  Stefani Dama, M.D.               DATE OF BIRTH:  06-08-1931   DATE OF PROCEDURE:  08/28/2003  DATE OF DISCHARGE:                                 OPERATIVE REPORT   PREOPERATIVE DIAGNOSIS:  Cervical spondylosis with myelopathy C3-4, C4-5, C5-  6, and C6-7. Cervical radiculopathy.   POSTOPERATIVE DIAGNOSIS:  Cervical spondylosis with myelopathy C3-4, C4-5,  C5-6, and C6-7. Cervical radiculopathy.   PROCEDURE:  Anterior cervical decompression C3-4, C4-5, C5-6, and C6-7.  Arthrodesis with structural allograft and Synthes plate fixation C3 to C7.   SURGEON:  Stefani Dama, M.D.   ASSISTANT:  Hewitt Shorts, M.D.   ANESTHESIA:  General endotracheal.   INDICATIONS FOR PROCEDURE:  The patient is a 77 year old individual who has  had significant neck, shoulder, and arm pain with dysesthesias in his upper  extremities and also weakness in his lower extremities and poor stamina for  any activity.  He was found to have a significant spondylitic myelopathy  secondary to cord compression from C3-4 down to C6 and C7. These were very  focal findings at each of the interspaces.  The patient was advised  regarding surgical decompression of his spinal canal via four-level anterior  diskectomy and arthrodesis.   DESCRIPTION OF PROCEDURE:  The patient was brought to the operating room  supine and on the stretcher.  After a smooth induction of general  endotracheal anesthesia, he was placed in 5 pounds of Halter traction.  The  neck was shaved, prepped with Duraprep, and draped in a sterile fashion.  A  transverse incision was made in the midportion of the neck and carried down  through the platysma.  The plane between the sternocleidomastoid and the  strap muscles  was dissected bluntly until the prevertebral space was  reached. The first identifiable disk space was noted to be that of C4-5.  The dissection of the prevertebral tissues was then carried cephalad and  inferiorly to expose the prevertebral space well.  The longus coli muscle  was stripped off either side in the midline and a Caspar retractor was  placed under this to expose C4-5 first.  The anterior border of the disk was  opened.  The disk was evacuated with a significant finding of markedly  degenerated disk material being removed from within the disk space.  The  posterior longitudinal ligament was reached and there was noted to be a  significant spondylitic ridge off the inferior margin of the body of C3 and  the uncinate processes at C4 were also noted to be severely hypertrophied.  These were taken down with a 2.3 mm dissecting bit and a high speed drill.  The common  dural tube and the C5 nerve roots were well decompressed by this  maneuver.  In the end then, the interspace cleared from side to side both  cephalad and caudally.  A 7 mm slightly lordotic graft was placed into this  interspace.  C3-4 was cared for in a similar fashion.  Here, significant  osteophytes were again encountered off the inferior margin of the body of  C3, superior margin of body of C4, and both uncinate processes.  Similar  decompression was undertaken.  A 6 mm slightly lordotic graft was placed in  the interspace at C3-4.  At C5-6 and C6-7, similar conditions were found not  quite as severe as C6-7, but at C5-6 there was marked compression of the  spinal cord centrally and significant biforaminal stenosis at C5-6.  At each  of these levels, 6 mm slightly lordotic grafts were placed into the  interspace to allow for good maintenance of the overall vertebral height and  good decompression of the nerve roots.  In the end then, a 76 mm Synthes  plate was precontoured to fit over four segments and these were  locked into  position with 10 locking 4 x 14 mm screws.  Final radiograph was obtained  which identified good position of the plate and the screws.  Then hemostasis  was carefully obtained in the prevertebral space.  Once this was achieved  satisfactorily, the platysma was closed with 3-0 Vicryl in interrupted  fashion, 3-0 Vicryl was used to close the subcuticular tissues. Dermabond  was placed on the skin. The patient tolerated the procedure well and was  returned to the recovery room in stable condition.  Estimated blood loss was  estimated for 200 mL for this entire procedure.                                               Stefani Dama, M.D.    Merla Riches  D:  08/28/2003  T:  08/29/2003  Job:  [id number]

## 2011-03-04 NOTE — Discharge Summary (Signed)
   NAME:  [name], [name]                         ACCOUNT NO.:  [id number]   MEDICAL RECORD NO.:  [id number]                   PATIENT TYPE:  INP   LOCATION:  3101                                 FACILITY:  [facility]   PHYSICIAN:  Stefani Dama, M.D.               DATE OF BIRTH:  1930-10-21   DATE OF ADMISSION:  08/28/2003  DATE OF DISCHARGE:  08/30/2003                                 DISCHARGE SUMMARY   ADMITTING DIAGNOSES:  Cervical spondylosis with myelopathy and cord  compression C3-4, C4-5, C5-6, and C6-7.   DISCHARGE AND FINAL DIAGNOSES:  Cervical spondylosis with myelopathy and  cord compression C3-4, C4-5, C5-6, and C6-7.   OPERATION:  Anterior cervical diskectomy arthrodesis, C3-4, C4-5, C5-6, and  C6-7.  Structural Allograft Synthes fixation.   CONDITION ON DISCHARGE:  Improving.   HOSPITAL COURSE:  Mr. Wiegman is a 77 year old individual who has had  significant neck, shoulder, and bilateral arm pain with some weakness that  had been insidious.  His gait had also been deteriorating.  He is known to  have a spondylitic myelopathy with evidence of severe cord compression over  the levels of C3-4, C4-5 it being the worst; C5-6 and C6-7 being less  severe.  He was advised regarding surgical decompression and stabilization,  and this was performed on August 28, 2003.  Postoperatively, he has had a  smooth and uneventful course.  The patient was maintained in the step-down  unit because he has a history of significant coronary artery disease and had  been using nitroglycerin on an occasional basis preoperatively.  The patient  was noted to have some bigeminy postoperatively; however, he was  asymptomatic, and the became ambulatory in the postoperative period without  too much difficulty.  Furthermore, the patient was also noted to have a  significant history of chronic obstructive pulmonary disease, and this was  monitored closely in the ACU, and he was encouraged to deep  breathe and was  easily weaned from oxygen.  At the time of discharge, his pain is controlled  with Percocet.  He I given a prescription for this and Valium to use for  muscle spasms.  He will be seen in the office in approximately two weeks'  time.                                                Stefani Dama, M.D.    [name]  D:  08/29/2003  T:  08/30/2003  Job:  [id number]

## 2011-03-04 NOTE — H&P (Signed)
NAME:  [name], [name]                         ACCOUNT NO.:  [id number]   MEDICAL RECORD NO.:  [id number]                   PATIENT TYPE:  INP   LOCATION:  6532                                 FACILITY:  [facility]   PHYSICIAN:  Doylene Canning. Ladona Ridgel, M.D. [facility]           DATE OF BIRTH:  10-29-1930   DATE OF ADMISSION:  01/08/2003  DATE OF DISCHARGE:                                HISTORY & PHYSICAL   ADMISSION DIAGNOSIS:  Chest pain/acute coronary syndrome.   HISTORY OF PRESENT ILLNESS:  The patient is a 77 year old man with known  coronary artery disease, status post non-Q-wave MI in December of 2002.  At  that time, he had a stent to the right coronary artery.  He had recurrent  chest pain back in January of 2004 and at that time underwent repeat  catheterization and had a Cypher stent placed to the RCA proximal to the  previously placed stent.  He presented again approximately 10 days earlier  with recurrent chest pain and catheterization demonstrated patent right  coronary artery at the stent site.  He did have a 70% ramus intermedius  stenosis and otherwise nonobstructive disease.  The patient since then has  had fairly stable anginal symptoms.  He states that with exertion he has  chest tightness, but this usually gets better with rest.  In addition, he  develops no chest tightness when he shaves.  There is no associated  component with food intake and deep breathing does not make it better or  worse.  The patient was in his usual state of health until Sunday when he  noticed he felt bad all day, especially being tired and had very mild chest  discomfort.  The chest discomfort became worse on Monday (two days ago) and  has been intermittent since them.  Nitroglycerin improves his symptoms.  The  pain is sharp and radiating to the left side of his chest.  There is no  __________.  He does report left shoulder pain today.  Associated with  shortness of breath and nausea today, but no  diaphoresis and no vomiting.  The patient states that the pain today is very similar to pain he had back  in 2002 when he non-Q wave MI.  He is free of congestive heart failure  symptoms other than dyspnea.  He denies peripheral edema, orthopnea, and  PND.   PAST MEDICAL HISTORY:  As previously noted.  Additionally, he has  hyperlipidemia, questionable history of COPD, gastrointestinal reflux  disease and hypertension.  He also has a history of history of arthritis in  the back and back surgery.   SOCIAL HISTORY:  The patient is widowed and lives in [place].  He has  seven children.  He is retired. He denies tobacco and ethanol use.   FAMILY HISTORY:  Notable for mother dying with complications of cardiac  disease and father dying with complications of diabetes.  He has one older  brother with diabetes and depression and one older brother with known heart  disease.   REVIEW OF SYSTEMS:  Notable for no acute chills or sweats.  There is  __________.  He denies headache, or visual or hearing problems.  He notes no  change __________.  He also notes chest pain and shortness of breath, as  previously noted.  __________  He denies claudication __________.  He denies  dysuria and hematuria.  He denies weakness, numbness, or gout problems.  He  denies nausea, diarrhea, and bright red blood per rectum.  Otherwise the  review of systems was negative.   PHYSICAL EXAMINATION:  GENERAL APPEARANCE:  A pleasant, well-appearing, 77-  year-old man in no distress.  VITAL SIGNS:  The blood pressure 175/80 initially and on subsequently  evaluation after IV nitroglycerin was 130/71.  The pulse was 66 and  irregular with frequent PVCs.  Respirations were 20.  The temperature was  98.7 degrees.  HEENT:  Normocephalic.  Atraumatic.  Pupils equal, round.  Oropharynx is  moist.  His sclerae were anicteric.  NECK:  No jugular venous distention, no thyromegaly.  Carotids were 2+ and  symmetric.  The  trachea was midline.  LUNGS:  Clear bilaterally to auscultation with no wheezing, rhonchi, or  rales.  CARDIOVASCULAR:  Regular rate and rhythm, normal S1 and S2.  I do not  appreciate any murmurs, thrills, or gallops.  ABDOMEN:  Soft, nondistended, there is no organomegaly.  There is no rebound  or guarding.  EXTREMITIES:  Demonstrate no cyanosis, clubbing, or edema.  Pulses were 2+  and symmetric.  NEUROLOGIC:  He was alert and oriented x 3 with cranial nerves II-XII  grossly intact.  Strength was 5/5 and symmetric.   LABORATORY DATA:  The EKG demonstrates normal sinus rhythm with frequent  PVCs, but no clearcut ST-T wave abnormality.  Laboratory evaluation  demonstrates an initial troponin of less than 0.01.  Electrolytes were okay.  The hemoglobin was normal.  The initial CK-MB was 214 and 6.4, respectively.   IMPRESSION:  1. Recurrent chest pain with symptoms very similar to that of a non-Q-wave     myocardial infarction back in 2002.  2. Preserved left ventricular function.  3. Hypertension.  4. Hyperlipidemia.  5. Questionable chronic obstructive pulmonary disease.  6. Gastrointestinal reflux disease.  7. Arthritis.   DISCUSSION:  I plan to admit the patient and continue IV heparin and  nitroglycerin.  We will continue on beta blockers and Lipitor.  Will obtain  serial cardiac enzymes and if these are __________ or positive, then  catheterization would be warranted.  If they are perfectly negative, then we  would consider outpatient Cardiolite stress test.                                               Doylene Canning. Ladona Ridgel, M.D. [facility]    GWT/MEDQ  D:  01/08/2003  T:  01/09/2003  Job:  [id number]   cc:   Laveda Abbe, M.D.  [place], [place]

## 2011-03-04 NOTE — Discharge Summary (Signed)
[place]  Patient:    [name], GANCARZ Visit Number: [id number] MRN: [id number]          Service Type: MED Location: [phone] Attending Physician:  Nathen May Dictated by:   Rozell Searing, P.A. Admit Date:  12/26/2001 Disc. Date: 12/28/01   CC:         Dr. Phylliss Blakes   Referring Physician Discharge [place]  PROCEDURES:  Coronary angiography, December 28, 2001.  REASON FOR ADMISSION:  Please refer to dictated admission note.  HOSPITAL COURSE:  Patient was admitted for management and further evaluation of unstable angina pectoris.  Serial cardiac markers notable for negative total CPK with marginally elevated MB fraction initially, and all three troponin I markers within normal limits.  Patient was placed on intravenous nitroglycerin and heparin with plans to proceed with coronary angiography.  He had had a recent non-Q MI treated with stenting of the mid RCA this past December and did have some residual mild to moderate LAD and RCA disease.  Coronary angiography performed on morning of discharge by Dr. Chales Abrahams (see report for full details) revealed borderline lesions of the LAD and DX-1 with otherwise nonobstructive CAD and no in-stent restenosis at the previous stent RCA site.  Specifically, there was 60% mid/50% distal LAD; 70% DX-1; 30% AV/OM-1; 30% proximal RCA with no significant in-stent restenosis.  Left ventriculogram was normal.  Dr. Chales Abrahams recommended outpatient adenosine Cardiolite and treatment with H2 blocker.  MEDICATION ADJUSTMENTS THIS ADMISSION:  Addition of both Zocor and low-dose Altace.  Nexium was up titrated at time of discharge to b.i.d. dosing x1 month.  LABORATORY DATA:  Cardiac enzymes:  Negative total CPK x3; mildly elevated MB (5.1) on admission; troponin I markers normal x3.  Lipid profile:  Total cholesterol 157, triglycerides 70, HDL 57, LDL 86 (ratio 2.8).  Normal liver enzymes.  Normal  electrolytes and renal function.  Normal CBC.  Admission CXR:  No acute disease.  DISCHARGE MEDICATIONS:  1. Nexium 40 mg b.i.d. (x1 month) then q.d.  2. Zocor 20 mg q.h.s.  3. Altace 2.5 mg q.d.  4. Lopressor 25 mg b.i.d.  5. Coated aspirin 81 mg q.d.  6. Plavix 75 mg q.d.  7. Cardura 2 mg q.h.s.  8. Albuterol inhaler as directed.  9. Pulmicort inhaler as directed. 10. Nitrostat 0.4 mg p.r.n.  INSTRUCTIONS:  ACTIVITY:  No heavy lifting/driving x2 days.  DIET:  Low fat/cholesterol diet.  WOUND CARE:  Call the office if there is any swelling/bleeding of the groin.  FOLLOW-UP:  Patient is scheduled for an adenosine Cardiolite on Wednesday, January 02, 2002 at 12:30 p.m. at [facility].  The patient is scheduled for follow up with Dr. Gunnar [facility] on January 07, 2002 at 10:30 a.m.  DISCHARGE DIAGNOSES:  1. Chest pain/known coronary artery disease.     a. Normal cardiac markers.     b. Nonobstructive coronary artery disease/no restenosis at stent right        coronary artery site - cardiac catheterization, December 28, 2001.     c. Normal left ventricle.     d. Non-Q-wave myocardial infarction/stent right coronary artery -        December 2002.  2. Gastroesophageal reflux disease.  3. History of asthma.  4. Benign prostatic hypertrophy. Dictated by:   Rozell Searing, P.A. Attending Physician:  Nathen May DD:  12/28/01 TD:  12/28/01 Job: [id number]

## 2011-03-04 NOTE — Cardiovascular Report (Signed)
NAMERODERIC, LAMMERT NO.:  [id number]   MEDICAL RECORD NO.:  [id number]          PATIENT TYPE:  INP   LOCATION:  3705                         FACILITY:  [facility]   PHYSICIAN:  Arvilla Meres, M.D. LHCDATE OF BIRTH:  [birth year]   DATE OF PROCEDURE:  03/07/2005  DATE OF DISCHARGE:                              CARDIAC CATHETERIZATION   PHYSICIANS:  1.  Dr. Letitia Libra in [place], primary care physician.  2.  Pricilla Riffle, M.D., cardiologist.   PATIENT IDENTIFICATION:  [name] is a very pleasant 77 year old male  with known coronary artery disease, status post stenting of the right  coronary artery who is admitted with chest pain concerning for unstable  angina and was thus referred for diagnostic angiography.   PROCEDURE:  1.  Selective coronary angiography.  2.  Left heart catheterization.  3.  Left ventriculogram.   DESCRIPTION OF PROCEDURE:  The risks and benefits of catheterization were  explained to [name]. Consent was signed and placed on the chart. The  right groin area was prepped and draped in routine sterile fashion and  anesthetized with 1% local lidocaine. A 6-French arterial sheath was placed  on the right femoral artery using a modified Seldinger technique. Standards  catheters were used throughout the procedure including a JL4, JR4 and angled  pigtail. All catheter exchanges were made over the wire. There were no  apparent complications. The patient was taken to the holding area for  removal of his vascular access.   FINDINGS:  1.  Central aortic pressure is 128/72 with a mean of 95.  2.  LV pressure was 136/8/15.  3.  There was no gradient on aortic valve pullback.   ANGIOGRAPHIC DATA:  1.  Left main was normal.  2.  LAD was a moderate-size vessel that coursed to the apex. It gave off      multiple septal perforators and one large proximal diagonal branch. In      the LAD, there was a 30% ostial lesion followed by a 30% proximal  lesion      at the takeoff of the first diagonal. There was a 50% midlesion and a 50-      60% distal lesion at a kink in the artery. In the first diagonal, there      was a 60-70% ostial lesion which was essentially unchanged from his      previous catheterization.  3.  Left circumflex system gave off a tiny ramus intermedius as well as two      moderate size OMs. There was a 30% lesion in the left circumflex      proximal and a 40% in the midvessel. There was a 70-80% ostial lesion in      a very small ramus. The caliber of this vessel was less than 1.5 mm.      This was also essentially unchanged from previous catheterization.  4.  A right coronary artery was a moderate size dominant vessel. It gave off      a PDA and two posterolateral branches. There was minor irregularity in  the proximal section of the right coronary. The proximal stent was      widely patent with only a minor irregularity in the stent. The vessel      was otherwise free of any angiographic coronary disease.   LEFT VENTRICULOGRAM:  Done in the RAO approach showed normal EF at 65% with  no significant mitral regurgitation.   ASSESSMENT/PLAN:  1.  Right coronary artery stent is widely patent.  2.  Borderline disease in the first diagonal which is essentially unchanged      from previous and does not appear to be a culprit lesion.  3.  There is a 70-80% ostial lesion and a very small ramus branch. This is      unchanged from before.   DISCUSSION:  There is no obvious culprit lesion. I reviewed the films with  Dr. Emilie Rutter. [name] and we will proceed with medical management. He will  likely be discharged home in the morning.      DB/MEDQ  D:  03/07/2005  T:  03/07/2005  Job:  [id number]

## 2011-03-04 NOTE — Cardiovascular Report (Signed)
NAME:  [name], [name]                         ACCOUNT NO.:  [id number]   MEDICAL RECORD NO.:  [id number]                   PATIENT TYPE:  INP   LOCATION:  4729                                 FACILITY:  [facility]   PHYSICIAN:  Arturo Morton. Riley Kill, M.D. [facility]         DATE OF BIRTH:  November 17, 1930   DATE OF PROCEDURE:  11/04/2002  DATE OF DISCHARGE:                              CARDIAC CATHETERIZATION   INDICATIONS:  The patient is a delightful 77 year old who previously  underwent stenting of the right coronary artery.  Recently he has developed  declining exercise tolerance, as well as tightness in the chest with most  activity. He was catheterized back in March at which time there was not a  substantial change in his coronary anatomy.  He was brought back in today  for repeat cardiac catheterization.  There was mild elevation in CK-MB, but  troponins were negative.   PROCEDURES:  1. Left heart catheterization.  2. Selective coronary arteriography.  3. Selective left ventriculography.  4. Percutaneous coronary intervention of the right coronary artery using a     3.0 x 13 Cordis Cypher post-dilated with a 3.25 Quantum Maverick to 15     atmospheres.   DESCRIPTION OF PROCEDURE:  The patient was brought to the catheterization  lab and prepped and draped in the usual fashion.  Through an anterior  puncture, the right femoral artery was easily entered. A 6 French sheath was  initially placed.  Views of the left and right coronary arteries were  obtained in multiple angiographic projections.  Central aortic and left ventricular pressures were measured with a pigtail.  Ventriculography was performed in the RAO projection.  There were no  complications from the diagnostic procedure.  We then discussed his case  carefully and reviewed the old films.  Importantly, there were multiple  potential sources of ischemia.  The patient's last radionuclide study  demonstrated no definite ischemia.   Compared to the previous catheterization  study, there was progression of disease just above the previous stent,  although this did not appear to be critical.  However, the patient clearly  demonstrated a history of suggestive of exertional angina. We talked about  various options including taking him off the table during the stress  Cardiolite versus stenting the right coronary artery, then doing the same  approach.  The patient was in favor of proceeding with stenting of the right  coronary artery followed by followup radionuclide testing to assess the LAD  territory.  With this, I discussed the case with his family.  Preparations  were made for percutaneous intervention.  Heparin and Integrilin were given  according to protocol and a 7 French sheath was placed.  The patient was  already on chronic Plavix.  A JL4 guiding catheter with side holes was  utilized and the lesion was crossed with a 0.014 Hi-Torque Floppy wire.  Because of the non critical nature of the  lesion, the lesion was directly  stented using a Cordis Cypher stent to 3.0 x 13.  Post-dilatation was done  throughout the stent using a 3.25 x 8 Quantum Maverick balloon.  The center  of the stent was post-dilated using at least 15 atmospheres of pressure.  There was marked improvement in the appearance of the artery. There were no  complications.  He was taken to the holding area in satisfactory clinical  condition.   HEMODYNAMIC DATA:  1. Central aorta 163/90, mean 122.  2. Left ventricular pressure 156/2/9.  3. No gradient on pullback across the aortic valve.   ANGIOGRAPHIC DATA:  1. Ventriculography was performed in the RAO projection.  There was     significant ventricular ectopy making ejection fraction calculation     unreliable.  However, EF appeared to be in excess of 50%. No definite     wall motion abnormalities were noted.  2. The left main coronary artery was without critical disease, demonstrating     only  minimal luminal irregularity.  3. The left anterior descending artery courses to the apex.  Just after the     large diagonal takeoff, the LAD is segmentally plaqued of about 50%.     This appears very similar to the previous study.  The mean lumen diameter     appears to be in the range of about 2.0.  It also appears to be fairly     smooth and without substantial change from the previous study.  The     diagonal itself is a moderate sized vessel which demonstrates 70%     segmental plaquing at the ostium.  4. The circumflex provides a tiny first marginal branch which is     insignificant and has about 70% ostial narrowing. The mid circumflex     prior to the large marginal takeoff has about 30-40% narrowing, that is     fairly focal and does not appear to be high-grade.  Two large marginal     branches are without critical disease.  5. The right coronary artery demonstrates about 30% irregularity proximally     followed by a 20-30% area of narrowing in the proximal to mid junction.     There is about a 70% area of stenosis just prior to the prior stent site.     Following stenting this is reduced to 0%.  The stent telescopes into the     previously placed stent which has less than 30% narrowing throughout the     stent site. The stent does cross the right ventricular branch which has     about 80% narrowing but is only moderate in size.  The posterior     descending and posterolateral branches are without critical disease.   CONCLUSIONS:  1. Preserved overall left ventricular function.  2. No obvious change in the left anterior descending disease.  3. Moderate stenosis of the right coronary artery with successful stenting     using a 3.0 x 13 Cordis Cypher stent.   DISPOSITION:  I have discussed this with the patient in detail.  We will  recommend exercise tolerance testing to further assess whether his symptoms are indeed improved, and to determine whether there is ischemia in   alternative territory. Previously there was not, and the LAD diagonal  disease does not appear to be substantially changed from the previous study.  He will need close followup.  Arturo Morton. Riley Kill, M.D. [facility]    TDS/MEDQ  D:  11/04/2002  T:  11/04/2002  Job:  [id number]   cc:   Charlaine Dalton. Sherene Sires, M.D. [facility]  [address]  [place]  [place] [address]  Fax: 1   Charlton Haws, M.D. [facility]   CV Laboratory

## 2011-03-04 NOTE — Cardiovascular Report (Signed)
NAME:  [name], [name]                         ACCOUNT NO.:  [id number]   MEDICAL RECORD NO.:  [id number]                   PATIENT TYPE:  INP   LOCATION:  2032                                 FACILITY:  [facility]   PHYSICIAN:  Charlies Constable, M.D.                  DATE OF BIRTH:  01-Aug-1931   DATE OF PROCEDURE:  11/07/2003  DATE OF DISCHARGE:  11/07/2003                              CARDIAC CATHETERIZATION   CLINICAL HISTORY:  [name] is 77 years old and has had remote stenting  of the right coronary artery. Over the last few days he has developed chest  pain with some radiation to his left arm. He was seen yesterday in the  office by Dr. Dietrich Pates and admitted for evaluation with angiography  today.   DESCRIPTION OF PROCEDURE:  The procedure was performed via the right femoral  artery using arterial sheath and 6 [place]  free form coronary catheters. A  frontal arterial puncture was performed and Omnipaque contrast was used. A  distal aortogram was performed to rule out abdominal aortic aneurysm. The  patient tolerated the procedure well. The right femoral artery was closed  with Perclose at the end of the procedure. The patient tolerated the  procedure well and left the laboratory  in satisfactory condition.   RESULTS:  Left main coronary artery:  The left main coronary artery is free  of significant disease.   Left anterior descending coronary artery:  The left anterior descending  coronary artery gave rise to 3 septal perforators and the diagonal branch.  There was 30% ostial stenosis in the LAD. There was  50% narrowing in the  proximal LAD. There was 50% to 70% narrowing in the distal LAD at a bend.   Circumflex artery:  The circumflex artery is gave rise to a marginal branch,  an atrial branch and 2 posterolateral branches. There were 10%  and 30%  stenosis in the mid circumflex artery.   Right coronary artery:  The right coronary artery was a moderate sized  vessel that  gave rise to a conus branch, right ventricular branch, posterior  descending branch and 2 posterolateral branches. There was irregularity in  the proximal vessel. There was less than 10% narrowing at the  stent site in  the proximal  to mid vessel. This vessel was free of major obstruction.   Left ventriculogram:  The left ventriculogram performed in the RAO  projection showed good wall motion with no areas of hypokinesis. The  estimated ejection fraction was 60%.   Distal aortogram:  The distal aortogram was performed which showed patent  renal arteries and no significant iliac obstruction.   The aortic pressure was 179/90 with a mean of 126. The left ventricular  pressure was 179/20.   CONCLUSION:  Coronary artery disease status post  remote stenting of the  right coronary artery with less than 10% narrowing at the  stent site in the  right coronary artery, 30% narrowing in the mid circumflex artery, 30%  ostial and 50% proximal  and 50% to 70% distal stenosis in the left anterior  descending coronary artery and normal LV function.   RECOMMENDATIONS:  The patient has what appears to be nonobstructive coronary  artery disease which does not appear to be enough  to  account for  his  recent symptoms. I will review his symptoms again with the patient and  discuss with Dr. Tenny Craw regarding any further  evaluation. We may let the  patient go home later today  and follow up with Dr. Tenny Craw.                                               Charlies Constable, M.D.    BB/MEDQ  D:  11/07/2003  T:  11/09/2003  Job:  [id number]   cc:   Pricilla Riffle, M.D.   Dr. Toy Cookey  [place]

## 2011-03-04 NOTE — H&P (Signed)
[facility]  Patient:    [name], [name] Visit Number: [id number] MRN: [id number]          Service Type: MED Location: [id number] Attending Physician:  Nathen May Dictated by:   Cecil Cranker, M.D. [facility] Admit Date:  12/26/2001                           History and Physical  HISTORY OF PRESENT ILLNESS:  [name] is a very pleasant 77 year old white married male with history of coronary artery disease, having been admitted December 9th with acute coronary syndrome and angiography on the 10th by Dr. Everardo Beals. Brodie revealing 70% proximal LAD, 50% mid-LAD, 90% intermediate, 70-95% and 60% proximal-to-mid RCA, focal hypokinesis, inferior basal segment.  Dr. Juanda Chance intervened on the right coronary artery with PTCA and stent and the patient did well following that.  He states that he was seen once in the office in December, however, since then, has continued to have some chest discomfort recurrently, exertional at times, and this has become much more severe in the past few days.  Monday, he had several minutes of very severe left sternal pain, somewhat sharp; this reminded him of his previous symptoms and today, he has had symptoms since this morning, still persisting at approximately 7/10.  He has had no associated nausea, diaphoresis, shortness of breath or weakness.  He appears quite stable at this time, though he is experiencing chest pressure and discomfort.  PAST HISTORY:  He has had asthma.  Actually, three years ago, he had chest pain and a cath which revealed nonobstructive disease.  He has had back surgery x5.  REVIEW OF SYSTEMS:  Gastroesophageal reflux disease.  He has had no melena or hematemesis.  GU reveals history of renal stones.  His weight has been stable.  ALLERGIES:  SULFA and DEMEROL.  FAMILY HISTORY:  Mother died of coronary artery disease and had a CABG at age 61.  Father -- irregular heart beat and  diabetes.  Brother -- coronaries okay in 2000.  HABITS:  The patient does not smoke.  SOCIAL HISTORY:  He is married.  MEDICATIONS: 1. Albuterol. 2. Aspirin 81 mg. 3. Plavix 75 mg. 4. Pulmicort two b.i.d. 5. Nexium 40 mg. 6. Doxazosin two h.s. 7. Metoprolol 25 mg b.i.d. 8. Nitroglycerin.  PHYSICAL EXAMINATION:  VITAL SIGNS:  Blood pressure 161/90, pulse 61, normal sinus rhythm, respirations 20.  GENERAL APPEARANCE:  Normal.  He appears in no distress.  NECK:  JVP is not elevated.  Carotid pulses bilaterally equal without bruits.  HEENT:  Unremarkable.  LUNGS:  Clear.  CARDIAC:  S4.  No murmur or rub.  ABDOMEN:  Unremarkable.  Liver, spleen and kidneys not palpable.  No masses.  EXTREMITIES:  No edema.  Faint pulses distally.  NEUROLOGIC:  Unremarkable.  LABORATORY AND ACCESSORY DATA:  EKG is normal.  IMPRESSION: 1. Coronary artery disease, three months post percutaneous transluminal    coronary angioplasty of high-grade right coronary artery with residual left    anterior descending and circumflex disease, as noted, now presents with    crescendo angina.  He does have normal electrocardiogram. 2. Apparently, patient had a non-Q myocardial infarction in December. 3. Asthma. 4. Gastroesophageal reflux disease. 5. History of right mediastinal opacity.  PLAN:  Therapy for acute coronary syndrome, IV nitroglycerin and heparin and if his symptoms are not relieved within the next hour or two, a  decision will   need to be made concerning urgent cath versus a.m.  A statin and ACE should be added and the right mediastinal opacity evaluated.Dictated by:   Cecil Cranker, M.D. [facility] Attending Physician:  Nathen May DD:  12/26/01 TD:  12/27/01 Job: [id number]

## 2011-03-04 NOTE — Assessment & Plan Note (Signed)
[facility]                            CARDIOLOGY OFFICE NOTE   [name], [name]                      MRN:          [id number]  DATE:09/18/2006                            DOB:          [birth year]    Patient is a 77 year old gentleman, history of CAD, hypertension,  dyslipidemia, asthma, GE reflux with strictures, depression.  I last saw  him back in June.   Patient is doing well clinically.  He denies chest pressure, no  shortness of breath, he actually says he is feeling better now than he  has in while.  He has a tough time around the holiday season without his  wife (deceased) and a lot of family around.   CURRENT MEDICATIONS:  1. Plavix 75 daily.  2. Cardura 4 daily.  3. Albuterol t.i.d.  4. Nexium 40 daily.  5. Caduet 5/80 daily.  6. Imdur 30 daily.  7. Advair discus.  8. Aspirin 325.  9. Metoprolol 75 b.i.d.  10.Allegra.   PHYSICAL EXAMINATION:  Patient is in no distress.  Blood pressure  134/73.  Pulse is 60.  Weight 178.  LUNGS:  Clear.  CARDIAC:  Regular rate and rhythm, S1, S2, no S3, no murmurs.  ABDOMEN:  Benign.  EXTREMITIES:  No edema.   LABS:  Significant for cholesterol of 148, LDL 76, HDL 54, triglycerides  of 90.  12-lead EKG normal sinus, bradycardia 59 beats per minute.   IMPRESSION:  1. Coronary artery disease status post non-Q-wave myocardial      infarction with stenting of the mid RCA in 2002, and the second      stent to the RCA in 2004 and 2007.  He had an 80% diagonal, 80%      ramus.  Plan for medical therapy, 2007, March.  He went on and had      stenosis of the diagonal branch with cutting balloon angioplasty.      Clinically doing well.  I would recommend continuing on the current regimen.  1. Dyslipidemia, excellent control.   I will set to see the patient back in about 8 months, sooner if problems  develop.     Pricilla Riffle, MD, [facility]  Electronically Signed    PVR/MedQ  DD: 09/18/2006  DT:  09/19/2006  Job #: [id number]   cc:   Bethena Midget, MD

## 2011-03-04 NOTE — Cardiovascular Report (Signed)
NAME:  [name], [name]                         ACCOUNT NO.:  [id number]   MEDICAL RECORD NO.:  [id number]                   PATIENT TYPE:  INP   LOCATION:  6532                                 FACILITY:  [facility]   PHYSICIAN:  Salvadore Farber, M.D. [facility]         DATE OF BIRTH:  02-09-1931   DATE OF PROCEDURE:  01/09/2003  DATE OF DISCHARGE:  01/10/2003                              CARDIAC CATHETERIZATION   PROCEDURES:  1. Left heart catheterization.  2. Left ventriculography.  3. Coronary angiography.   CARDIOLOGIST:  Salvadore Farber, M.D.   INDICATIONS:  The patient is a 77 year old gentleman status post stenting of  his RCA in January 2004 and March 2003.  He now presents with recurrence of  his chest pain identical to prior cardiac chest pain.  This has occurred  primarily at rest.  He ruled out for myocardial infarction by serial  enzymes.  Due to the recently Cypher stent, he is referred for diagnostic  angiography to exclude impending subacute stent thrombosis.   DIAGNOSTIC TECHNIQUE:  Informed consent was obtained.  Under 2% lidocaine  local anesthesia, a 6 French sheath was placed in the right femoral artery  using modified Seldinger technique.  Diagnostic angiography and  ventriculography were performed using JL4, JR4 and 6 French pigtail  catheters.  The patient tolerated the procedure well and was transferred to  the holding area in stable condition.  Sheaths and tubing were removed  there.   COMPLICATIONS:  None.   FINDINGS:  1. Left ventricle:  145/14/18.  Ejection fraction 55% without regional wall     motion abnormality.  2. No aortic stenosis or mitral regurgitation.  3. Left main angiographically normal.  4. Left anterior descending:  The LAD is a moderate size vessel giving rise     to a single large diagonal branch.  There is a 30% stenosis in the mid     LAD after the take off of the diagonal.  There is a 50% stenosis of the     proximal portion of the  diagonal.  5. Ramus intermedius:  This is a small vessel with an ostial 70% stenosis     which is unchanged from previous.  6. The circumflex is a large vessel giving rise to two large obtuse marginal     branches.  There is a 20% stenosis of the proximal vessel.  7. Right coronary artery:  The RCA is a large dominant vessel.  The stents     in the proximal vessel are widely patent without significant restenosis.    IMPRESSION/RECOMMENDATIONS:  Since the last catheterization in February  2004, there has been no change in the patient's coronary anatomy.  As  previously, the ischemia due to the ostial lesion of the small ramus  intermedius cannot be fully excluded.  However, this vessel is still too  small to be amenable to percutaneous therapy.  Would therefore continue with  medical therapy.                                               Salvadore Farber, M.D. [facility]    WED/MEDQ  D:  01/09/2003  T:  01/10/2003  Job:  [id number]   cc:   Charlaine Dalton. Sherene Sires, M.D. [facility]   Pricilla Riffle, M.D. [facility]   [place] (?) Eston Esters, M.D.

## 2011-03-04 NOTE — Discharge Summary (Signed)
NAME:  [name], [name]                         ACCOUNT NO.:  [id number]   MEDICAL RECORD NO.:  [id number]                   PATIENT TYPE:  INP   LOCATION:  6531                                 FACILITY:   PHYSICIAN:  Guy Franco, P.A. [facility]                DATE OF BIRTH:  1930-11-17   DATE OF ADMISSION:  11/01/2002  DATE OF DISCHARGE:  11/05/2002                           DISCHARGE SUMMARY - REFERRING   DISCHARGE DIAGNOSES:  1. Coronary artery disease, status post percutaneous transluminal coronary     angioplasty/Cypher stenting of the right coronary artery on 11/04/02.  2. Hyperlipidemia, treated.  3. Hypertension, treated.  4. Chronic obstructive pulmonary disease followed by Dr. Sandrea Hughs.  5. Gastroesophageal reflux disease.  6. History of benign prostatic hypertrophy.  7. History of chronic nephrolithiasis.  8. History of multiple back surgeries.   ALLERGIES:  1. SULFA.  2. DEMEROL.   HOSPITAL COURSE:  The patient is a 77 year old male patient  with known  history of coronary artery disease.  He underwent stenting of the mid RCA in  12/02 after experiencing a subendocardial myocardial infarction.  He was  admitted on 11/01/02 from the office after a two to three day history of  substernal chest pain at rest and with  mild activity.  The pain was  responsive to sublingual nitroglycerin.  Ultimately, he required cardiac  catheterization that was performed by Dr. Bonnee Quin.  The catheterization  revealed the following:  Ostial 30% RCA with proximal 30% stenosis followed  by 70% stenosis in the early mid vessel that required Cypher stenting and  this was reduced to a 0% lesion postprocedure.  There was a 40% mid  circumflex lesion.  There was also a 50 to 70% lesion in the mid LAD.  After  stent was placed, the patient tolerated the rest of this hospitalization  without significant difficulties and will be asked to continue his Plavix.  He will need a rest stress Cardiolite  in approximately four weeks.  He will  need to be seen in the office in two weeks for further evaluation.  In  speaking with Dr. Riley Kill, he is concerned about the LAD stenosis and is  concerned that he  may ultimately need a percutaneous coronary intervention  of that lesion if he either has symptoms or the Cardiolite is positive for  ischemia.   LABORATORY DATA:  Hemoglobin 13.6, hematocrit 39.4, white count 7.3,  platelets 200.  Sodium 137, potassium 3.7, BUN 9, creatinine 1.0.  Hemoglobin A1c 6.3, total cholesterol 160, triglycerides 72, HDL 48, LDL 98.  Maximum CK 172 with 5.4 MB fractions.  All troponins were negative.   DISCHARGE MEDICATIONS:  1. Nexium 40 mg twice a day.  2. Plavix 75 mg daily.  3. Cardura 2 mg q.h.s.  4. Lopressor 50 mg 1/2 tablet p.o. b.i.d.  5. Albuterol and budesonide nebulizers as prior to admission.  6. Theochron  200 mg two tablets daily.  7. Sublingual nitroglycerin p.r.n.  chest pain.  8. Lipitor 20 mg a day.  9. He is to increase his aspirin to 325 mg a day.   He  may use Tylenol as needed for pain.  No strenuous activity for 2 days,  then resume normal activity.  Remain on a low fat diet.  Clean the cath site  with __________.  Call for questions or concerns.  I will make him a  followup appointment in two weeks and then at that time a Cardiolite should  be performed two weeks thereafter as outlined by Dr. Riley Kill.                                               Guy Franco, P.A. [facility]    LB/MEDQ  D:  11/05/2002  T:  11/05/2002  Job:  [id number]

## 2011-03-04 NOTE — Op Note (Signed)
[name], [name]  [id number]   MEDICAL RECORD NO.:  [id number]          PATIENT TYPE:  OIB   LOCATION:  3041                         FACILITY:  [facility]   PHYSICIAN:  Stefani Dama, M.D.  DATE OF BIRTH:  12-30-1930   DATE OF PROCEDURE:  11/11/2004  DATE OF DISCHARGE:                                 OPERATIVE REPORT   PREOPERATIVE DIAGNOSIS:  Cervical herniated nucleus pulposus, C7-T1, left,  with left cervical radiculopathy.   POSTOPERATIVE DIAGNOSIS:  Cervical herniated nucleus pulposus, C7-T1, left,  with left cervical radiculopathy.   PROCEDURE:  Cervical laminotomy and diskectomy, C7-T1, left, with operating  microscope, Metrix approach, sitting position.   SURGEON:  Stefani Dama, M.D.   FIRST ASSISTANT:  Cristi Loron, M.D.   ANESTHESIA:  General endotracheal.   INDICATIONS:  [name] is a 77 year old individual who had a  previous anterior decompression arthrodesis from C3 to C7. He did well for  the last 2 years; however, rather acutely about a week ago, he developed  severe left arm pain. He was found to have a herniated nucleus pulposus at  C7-T1 on the left side in the region of the foramen. He was advised  regarding surgical decompression of this lesion and was taken to the  operating room.   PROCEDURE:  The patient had previously had a central line placed and after  smooth induction of general endotracheal anesthesia, he was placed in the 3-  pin headrest and then placed into the sitting position. Precordial  ultrasound monitoring was performed to check for air. The back of the  patient's neck was shaved, prepped with Hibiclens and then alcohol, and then  draped in sterile fashion. The left paramedian region was localized and  using fluoroscopic guidance, the C7-T1 interspace was localized. The area  was infiltrated and then a K-wire was passed to laminar arch of C7. An  incision was created over this region and then  a series of dilators were  passed over the K-wire and then using a wanding technique, the interspace at  C7 and T1 was cleared on the left side. A series of dilators were then  passed to expose the C7-T1 space to an 18-mm diameter. A 6-cm Metrix cannula  was placed down to this region; this was locked to the operating table with  a clamp and then the microscope was draped and brought into the field. Soft  tissues over the interlaminar space were cleared using a monopolar cautery  and then the laminotomy was created using a 2.3-mm dissecting tool on a long  drill extension. The yellow ligament and the interspinous ligament were then  taken up and the lateral aspect of lamina was cleared with this technique.  The common dural tube was identified and the nerve root was identified, and  this was carefully protected. The laminotomy was opened far out into the  foramen, creating a partial facetectomy of the C7-T1 space. Hemostasis was  achieved with bipolar cautery and some small pledgets of Gelfoam soaked in  thrombin, which were later removed. The epidural veins over  the nerve root  itself were then elevated, cauterized and divided using a microsurgical  technique. The nerve root was then dissected on the underside and the space  was explored. On the underside nerve root, there were found to be some small  fragments of disk, particularly far out on the shoulder of the nerve root.  With further exploration, several fragments of disk were removed and these  readily went up the suction and further dissection along this area  identified several other small fragments. Ultimately, this allowed for good  decompression of the nerve root in the extraforaminal space. Care was taken  to probe as far laterally as possible to make sure no other fragments of  disk had remained. Once decompression was achieved, hemostasis was achieved  in the soft tissue surrounding this area. Dr. Lovell Sheehan helped by exploring   and providing retraction while this decompression under was undertaken. Once  the decompression and hemostasis were obtained and we were both satisfied  that no other fragments of disk were in this position, 40 mg of Depo-Medrol  were placed over the exiting nerve root and then the endoscopic cannula was  removed.  3-0 Vicryl was used in an inverted-interrupted fashion to close  the subcutaneous and superficial layers of the skin. Dermabond was then  placed on the skin. The patient was removed from the head frame and returned  to recovery room in stable condition.      HJE/MEDQ  D:  11/11/2004  T:  11/12/2004  Job:  [id number]

## 2011-03-04 NOTE — Discharge Summary (Signed)
NAME:  [name], [name]                         ACCOUNT NO.:  [id number]   MEDICAL RECORD NO.:  [id number]                   PATIENT TYPE:  INP   LOCATION:  2032                                 FACILITY:  [facility]   PHYSICIAN:  Charlies Constable, M.D.                  DATE OF BIRTH:  06-Apr-1931   DATE OF ADMISSION:  11/06/2003  DATE OF DISCHARGE:  11/07/2003                                 DISCHARGE SUMMARY   BRIEF HISTORY:  This is a 77 year old male with a history of non-Q-wave MI  in December 2002 with percutaneous intervention to the RCA x 2.  He was seen  in the office on November 06, 2003, for evaluation of chest pain.  He was  admitted to [facility] for further evaluation.   PAST MEDICAL HISTORY:  As noted, the patient had a non-Q-wave MI in December  2002.  He had a stent to her RCA.  He had a stent to the RCA on November 04, 2002 as well.  He was catheterized in March 2004 at which time he had patent  stents.  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   ALLERGIES:  SULFA and DEMEROL.   SOCIAL HISTORY:  The patient lives in [place].  He is widowed.  He has  never smoked or used alcohol.   HOSPITAL COURSE:  As noted, the patient was admitted to [facility]  for further evaluation of chest pain.  His enzymes were found to be  negative.  His BNP was 111.  He underwent cardiac catheterization on November 07, 2003, by Dr. Juanda Chance.  Please see Dr. Regino Schultze dictated report for full  details.  The patient was found to have a 30% ostial LAD, 50% proximal LAD,  50 to 70% distal LAD.  The circumflex had a 30% mid.  The RCA was less than  10% at the previous stent sites.  The left ventricle was normal.  Decision  was made to discharge the patient home later that evening with instructions  to increase his Nexium to 40 mg b.i.d.   LABORATORY DATA:  Labs currently in the computer show a CBC from today:  Hemoglobin 12.9, hematocrit 37.7, WBC 6800, platelets 188,000.  Troponin I  0.01.  CK 117, MB 2.7, index 2.3.  Other cardiac enzymes negative.  BNP 111.  A CBC was  within normal limits.  PT and PTT  within normal limits.  Chemistry profile  within normal limits except BUN 5, creatinine 0.9,  potassium 3.7, glucose 108.   Chest x-ray from January 20 showed possible COPD.   DISCHARGE MEDICATIONS:  1. The patient was discharged on the same medications she was on prior to     admission except Nexium was increased to 40 mg b.i.d.  2. Plavix 75 mg daily.  3. Cardura 2 mg h.s.  4. Aspirin 325 mg daily.  5. Lopressor 25 mg b.i.d.  6. Lipitor 80 mg h.s.  7. Imdur 90 mg daily.  8. Lorazepam 1 mg b.i.d.  9. Budesonide 0.5 mg b.i.d. nebulizer.  10.      Albuterol nebulizer.  11.      Pulmicort nebulizer.  12.      Nitroglycerin p.r.n.   DISCHARGE INSTRUCTIONS:  1. The patient was instructed to call the office on Monday for followup     appointment with Dr. Dietrich Pates in one to two weeks.  2. He was given the usual precautions regarding his catheterization site.     He was told to call the office for any problems.   PROBLEM LIST AT TIME OF DISCHARGE:  1. Coronary artery disease as described above.  2. Cardiac catheterization performed November 07, 2003, with patent stents,     normal left ventricular function.  3. History of hypertension.  4. History of elevated lipids.  5. Degenerative disk disease.  6. Asthma.  7. Gastroesophageal reflux disease.  8. Benign prostatic hypertrophy.      Delton See, P.A. [facility]                  Charlies Constable, M.D.    DR/MEDQ  D:  11/07/2003  T:  11/07/2003  Job:  [id number]   cc:   Gracelyn Nurse, M.D.  [address]  [place] [address]  Fax: [phone]

## 2011-03-04 NOTE — Discharge Summary (Signed)
NAME:  [name], [name]                         ACCOUNT NO.:  [id number]   MEDICAL RECORD NO.:  [id number]                   PATIENT TYPE:  INP   LOCATION:  6532                                 FACILITY:  [facility]   PHYSICIAN:  Doylene Canning. Ladona Ridgel, M.D. [facility]           DATE OF BIRTH:  Feb 28, 1931   DATE OF ADMISSION:  01/08/2003  DATE OF DISCHARGE:  01/10/2003                           DISCHARGE SUMMARY - REFERRING   BRIEF HISTORY:  This 77 year old male presents to [facility]  Emergency Room for evaluation of chest pain.  The patient has known coronary  artery disease as documented below.  The patient stated that he felt bad all  day on Sunday.  He was tired. He had mild chest pain.  On Monday, the pain  got worse and was almost constant times several days.  He had some relieve  with nitroglycerin. The pain was worse on the day of admission.  He  described the pain as sharp, burning, substernal, radiating to the left as  well as to his shoulder blade, left neck, and arm.  He had mild tightness  when seen in the emergency room.  This was associated with shortness of  breath but no diaphoresis.  He did have some nausea but no vomiting.  This  was similar to his previous cardiac pain.  He had an appointment to see Dr.  Ross in the office the following day; however, he felt he could not wait.   PAST MEDICAL HISTORY:  1. Non-Q-wave MI in December 2002 and had a Pixel stent placed in his right     coronary artery.  He had a Cypher stent placed in the right coronary     artery 11/04/2002 proximal to the previous stent.  He was noted to have     diffuse disease at that time.  He was readmitted 11/13/2002 with chest     pain.  He underwent cardiac catheterization on 11/14/2002.  His RCA had a     30 % proximal lesions, but his stents were patent.  The LAD had a 30%,     diagonal 50%, ramus intermedius had a 70%, circumflex had mild disease.  2. Elevated lipids.  3. Hypertension.  4. COPD.  5.  Gastroesophageal reflux disease.  6. BPH.  7. Renal calculi.  8. Multiple back surgeries.   ALLERGIES:  SULFA and DEMEROL.   SOCIAL HISTORY:  The patient lives in [place].  He is widowed.  He  has two daughters and three sons.  He lives alone.  He is retired.  He does  not use alcohol or tobacco.   FAMILY HISTORY:  The patient's mother died from coronary artery disease  status post coronary artery bypass graft surgery.  His father died from  diabetes mellitus and had an irregular heart rate.  He has a brother who is  alive and well.   HOSPITAL COURSE:  As noted, the  patient was admitted to [facility]  through the emergency room for further evaluation of chest pain with known  coronary artery disease as documented above. He was found to have negative  cardiac enzymes.   The patient underwent cardiac catheterization on 01/09/2003 performed by Dr.  Samule Ohm.  He was found to have widely patent stents.  There was diffuse  disease in the left system including a 70% ramus intermedius.  His ejection  fraction was 55% without regional wall motion abnormalities.  There was no  AS; there was no MR. It was felt that his symptoms may have possibly been  due to the small ramus intermedius, and continued medical therapy was  recommended.  Arrangements were made to discharge the patient the following  day, 01/10/2003, in improved and stable condition.  Dr. Tenny Craw will discuss  possibly increasing his Imdur when seen back in the office.   LABORATORY DATA:  On 01/09/2003, hemoglobin 13.4, hematocrit 39.5, WBC 8.1,  platelets 180,000   Chest x-ray showed no active disease.   Chemistry profile performed on 01/08/2003 was within normal limits.  Cardiac  enzymes were interpreted as negative.  He had a lipid profile on March 25  that revealed cholesterol 133, triglycerides 92, HDL 49, LDL 66.   EKG: Normal sinus rhythm with PVCs, 64 beats per minute, but no definite  ischemia.    DISCHARGE MEDICATIONS:  The patient was discharged on the same medications  he was on prior to admission and included:  1. Imdur 60 mg daily.  2. Plavix 75 mg daily.  3. Enteric-coated aspirin 325 mg daily.  4. Nexium 40 mg daily.  5. Lopressor 25 mg b.i.d.  6. Lipitor 20 mg daily.  7. Cardura 2 mg daily.  8. Theo-Dur 400 mg daily.  9. Albuterol hand-held nebulizer treatments.  10.      Nitroglycerin p.r.n.  11.      Pulmicort hand-held nebulizer treatments as well as Pulmicort     inhaler.  12.      Atrovent hand-held nebulizer treatments.   ACTIVITY:  The patient was told to avoid any strenuous activity or driving  for at least two days.  He was not to lift more than 10 pounds for one week.   DIET:  Low-salt, low-fat.   FOLLOW UP:  He was told to call the office if he had any increased pain,  swelling, or bleeding from his groin.  He would follow up with Dr. Dietrich Pates on Monday, April 19, at 12 noon, Dr. Jenelle Mages as needed or as  scheduled.   DISCHARGE PROBLEM LIST:  1. Chest pain, myocardial infarction ruled out.  2. Cardiac catheterization performed 01/09/2003.  Please see results above.  3. Previous history of coronary artery disease as described above with two     previous stents to the right coronary artery with normal ejection     fraction.  4. Hypertension.  5. Elevated lipids.  6. Questionable chronic obstructive pulmonary disease.  The patient never     smoked; however, he does have asthma.  7. Gastroesophageal reflux disease.  8.     Benign prostatic hypertrophy.  9. Renal calculi.  10.      History of multiple back surgeries.     Delton See, P.A. [facility]                  Doylene Canning. Ladona Ridgel, M.D. [facility]    DR/MEDQ  D:  01/10/2003  T:  01/11/2003  Job:  [id number]   cc:   Laveda Abbe, M.D.  Juanna Cao

## 2011-03-04 NOTE — Cardiovascular Report (Signed)
[place]  Patient:    CURRAN, LENDERMAN Visit Number: [id number] MRN: [id number]          Service Type: MED Location: [phone] Attending Physician:  Talitha Givens Dictated by:   Everardo Beals Juanda Chance, M.D. [facility] Proc. Date: 09/25/01 Admit Date:  09/24/2001 Discharge Date: 09/27/2001   CC:         Luis Abed, M.D. [facility]  Cardiopulmonary Lab   Cardiac Catheterization  CLINICAL HISTORY:  Mr. Rigor is 77 years old and had a catheterization done in 2000 which showed nonobstructive coronary disease.  He recently developed prolonged chest pain and came to the emergency room at [facility] by ambulance from [facility].  His troponin was positive for non-Q-wave myocardial infarction.  He was scheduled for angiography today and started on Integrilin.  PROCEDURE: 1. Cardiac catheterization. 2. Percutaneous coronary intervention on the right coronary artery.  CARDIOLOGISTEverardo Beals Juanda Chance, M.D. [facility]  PROCEDURE IN DETAIL:  The procedure was performed via the right femoral artery using a arterial sheath and 6-French preformed coronary catheters.  A frontal arterial punch was performed, and Omnipaque contrast was used.  At the completion of diagnostic study, I made decision to proceed with intervention on the right coronary artery.  The patient was given weight-adjusted heparin that prolonged the ACT to greater than 200 seconds and was already on Integrilin infusion.   We used a 6-French guiding catheter with side holes and a Forte wire.  We direct stented with a 2.5 x 28 mm Pixel stent, deploying this with one inflation at 10 atmospheres for 40 seconds and two more inflations at 14 and 13 atmospheres for 31 and 29 seconds.  We then post dilated with a 3.0 x 20 mm Quantum Ranger, performing one inflation at 15 atmospheres for 47 seconds at the proximal edge, one inflation at 10 atmospheres for 49 seconds at the distal edge, and a third  inflation at 14 atmospheres for 30 seconds in the mid portion of the stent.  Repeat diagnostic study was then performed through the guiding catheter.  The patient tolerated the procedure well and left the laboratory in satisfactory condition.  RESULTS:  Left main coronary artery was free of significant disease.  Left anterior descending artery gave rise to two septal perforators and a large diagonal branch.  There was 70% narrowing in the proximal LAD and 50% narrowing in the mid LAD.  The left circumflex artery was a moderate size vessel and gave rise to a small intermediate branch, a marginal and posterolateral branch.  There was 90% ostial stenosis of the small intermediate branch.  There were irregularities in the proximal circumflex, but the rest of the vessels were free of significant disease.  The right coronary artery was a moderately large vessel that gave rise to a marginal branch, a posterior descending branch, and three posterolateral branches.  There was a 95% stenosis in the mid portion of the vessel which was eccentric and had filling defect thought to represent thrombus.  There was segmental disease with 70% focal narrowing just proximal to this lesion and 60% focal narrowing distal to the lesion.  The left ventriculogram performed in the RAO projection showed hypokinesis of the inferobasilar segment.  The overall wall motion was good with an ejection fraction estimated at 60%.  Following stenting of the right coronary artery stenoses, the 95%, 70%, and 60% stenoses improved to 0%.  There was no dissection seen.  CONCLUSION: 1.  Coronary artery disease status post recent non-Q-wave myocardial infarction    with 70% proximal and 50% mid stenosis in the left anterior descending    artery, 90% ostial stenosis of a very small intermediate branch of the    circumflex artery, and 95% stenosis in the mid right coronary artery with    inferobasilar wall hypokinesis. 2.  Successful stenting of the right coronary artery lesion with improvement    in stent narrowing from 95% to 0%.  DISPOSITION:  The patient was kept for further observation. Dictated by:   Everardo Beals Juanda Chance, M.D. [facility] Attending Physician:  Talitha Givens DD:  09/25/01 TD:  09/25/01 Job: [id number]

## 2011-03-04 NOTE — Discharge Summary (Signed)
[name], JAGODA NO.:  [id number]   MEDICAL RECORD NO.:  [id number]          PATIENT TYPE:  INP   LOCATION:  3705                         FACILITY:  [facility]   PHYSICIAN:  Olga Millers, M.D. LHCDATE OF BIRTH:  1931/07/04   DATE OF ADMISSION:  03/04/2005  DATE OF DISCHARGE:  03/08/2005                                 DISCHARGE SUMMARY   PRINCIPAL DIAGNOSIS:  Unstable angina/coronary artery disease.   OTHER DIAGNOSES:  1.  Hypertension.  2.  Dyslipidemia.  3.  Asthma.  4.  Gastroesophageal reflux disease.  5.  Depression.   ALLERGIES:  1.  SULFA.  2.  DEMEROL.   PROCEDURE:  Left heart cardiac catheterization.   HISTORY OF PRESENT ILLNESS:  A 77 year old white male with prior history of  __________ 2002 with PCI of the RCA __________ repeat __________ PCI of RCA  2004 __________.  He was in his usual state of health until Mar 04, 2005,  when he developed severe substernal chest pressure radiating to his left  arm, associated with shortness of breath.  It was unrelieved following two  sublingual nitroglycerin.  Pain persisted and he presented to the [facility] ED where he ruled out for MI with further evaluation.   HOSPITAL COURSE:  Patient was taken for cardiac catheterization on Mar 07, 2005, that showed a normal left main, 50% stenosis in the mid and distal  LAD, 70 to 80% proximal stenosis in a small ramus, a 30% proximal and 40%  mid stenosis in the left circumflex, minor irregularities within the  previously placed stent in the RCA.  His EF was 65%.  His beta-blocker was  titrated from 25 mg b.i.d. to 75 mg b.i.d. on discharge and post  catheterization, had been ambulating without any recurrent discomfort.  He  did have some oozing when he walked around last night.  This morning, his  groin looks good and there is no hematoma.  He is discharged home today in  satisfactory condition.   DISCHARGE LABORATORY DATA:  Hemoglobin  less than 8, __________.  Sodium 142,  potassium 3.9, chloride 103, CO2 30, BUN 13, creatinine 1.1, glucose 99,  calcium 9.2.  Cardiac enzymes showed a CK of 178, MB 3.9, troponin I of  0.02.  BNP 62.  Total cholesterol 111, triglycerides 80,` HDL 42, LDL 51.  TSH 3.894.  Total bilirubin 0.5, alkaline phosphatase 91, AST 23, ALT 20,  total protein 5.8, albumin 3.3, magnesium 2.0.   DISCHARGE PHYSICAL EXAMINATION:  GENERAL APPEARANCE:  No acute distress,  awake, alert and oriented x3.  VITAL SIGNS:  Temperature 98.7, heart rate 59, respirations 20, blood  pressure 102/68, pulse oxygen saturation  __________.  NECK:  Supple, no JVD, __________.  CARDIOVASCULAR:  Regular S1 and S2, no S3 or S4.  No murmur.  ABDOMEN:  Soft, nontender and nondistended.  Bowel sounds present x4.  EXTREMITIES:  Warm and dry, pink.  No clubbing, cyanosis, or edema.  Dorsalis pedis and posterior tibial pulses 2+ bilaterally.  The right groin  site __________.   DISPOSITION:  Patient is being discharged home in good condition.   FOLLOW UP:  Patient has follow-up with Dr. Pricilla Riffle, March 21, 2005, at  11 a.m.   DISCHARGE MEDICATIONS:  1.  Aspirin 81 mg daily.  2.  Plavix 75 mg daily.  3.  Lopressor 75 mg b.i.d.  4.  Lexapro 10 mg daily.  5.  Hyoscyamine p.r.n.  6.  Caduet 5/80 mg daily.  7.  Cardura 2 mg daily.  8.  Nexium 40 mg b.i.d.  9.  Nitroglycerin 0.4 mg total p.r.n. chest pain.  10. Advair 250/50 one inhalation q.12h.  11. Albuterol inhaler two puffs q.4h.   Discharge planning 45 minutes.      CRB/MEDQ  D:  03/08/2005  T:  03/08/2005  Job:  [id number]

## 2011-03-04 NOTE — H&P (Signed)
NAMEMarland [name]  [name], [name] NO.:  [id number]   MEDICAL RECORD NO.:  [id number]          PATIENT TYPE:  EMS   LOCATION:  MAJO                         FACILITY:  [facility]   PHYSICIAN:  Arvilla Meres, M.D. LHCDATE OF BIRTH:  1931/03/13   DATE OF ADMISSION:  12/24/2005  DATE OF DISCHARGE:                                HISTORY & PHYSICAL   PRIMARY CARE PHYSICIAN:  Dr. Marcelino Duster   CARDIOLOGY:  Dr. Dietrich Pates   REASON FOR ADMISSION:  Chest pain concerning for unstable angina or possible  pulmonary embolus.   HISTORY OF PRESENT ILLNESS:  [name] is a 77 year old male with multiple  medical problems including a history of coronary artery disease status post  a non ST elevation MI in December 2002 treated with bare metal stenting to  the mid RCA.  In January of 2004 had another catheterization which showed a  lesion prior to the previously  placed stent in the RCA and this was treated  with a CYPHER drug-eluting stent.  Since that time he has had chronic stable  angina about one to two times a week which is usually responsive to one  sublingual nitroglycerin.  He has had also several cardiac catheterizations,  most recently in May 2006 which showed stable coronary artery disease  without a culprit lesion.  His stents were patent and he has been continued  on medical therapy.  This morning he cleaned his car.  Later in the day he  came in to sit on his recliner.  At about 4 p.m. he developed severe pain in  his left calf which he said felt like deep bone pain.  There was no  swelling.  He said the pain quickly resolved and then he developed severe  chest pain and mild shortness of breath which he described as squeezing in  his chest which felt like his previous MI.  He took two sublingual  nitroglycerin with only mild relief.  Given the persistence of his chest  pain he came to the emergency room.  In the emergency room his EKG showed  sinus bradycardia, rate of 55.   First set of cardiac enzymes were negative.  His chest x-ray was unremarkable.  He still continues to complain of 3/10  chest pain, though looks fairly comfortable.  He denies any recent travel or  trauma.   REVIEW OF SYSTEMS:  This is per H&P and problem list, otherwise all systems  are negative.   PAST MEDICAL HISTORY:  1.  Coronary artery disease.      1.  Status post non ST elevation MI in December 2002 with bare metal          stenting to the mid RCA.      2.  Status post CYPHER drug-eluting stent to the proximal to mid RCA in          January of 2004.      3.  Most recent cardiac catheterization in May of 2006 with stable          coronary artery disease.  Normal LV function.  2.  Chronic stable angina.  3.  Hypertension.  4.  Asthma.  5.  Hyperlipidemia.  6.  History of gait disorder followed by Dr. Avie Echevaria of neurology.  7.  Gastroesophageal reflux disease.  8.  BPH.  9.  Kidney stones.  10. History of multiple back surgeries and cervical disk surgery.  11. Depression/anxiety.   CURRENT MEDICATIONS:  1.  Aspirin 325 a day.  2.  Plavix 75 a day.  3.  Metoprolol 75 b.i.d.  4.  Nitroglycerin p.r.n.  5.  Caduet 5/80.  6.  Cardura 4 mg a day.  7.  Nexium 40 b.i.d.  8.  Hyoscyamine as needed.  9.  Albuterol and Atrovent nebulizers as needed.  10. Advair 250/50 Diskus one inhalation b.i.d.   ALLERGIES:  SULFA, DEMEROL.   SOCIAL HISTORY:  [name] is widowed.  He lives in [place].  He  has never smoked and does not drink alcohol.  He is retired.   FAMILY HISTORY:  Mother died from complications of coronary disease.  Father  died from complications of diabetes.  He has one older brother with diabetes  and depression, another older brother with known heart disease.   PHYSICAL EXAMINATION:  GENERAL:  He is lying in bed in no acute distress.  Respirations are unlabored.  VITAL SIGNS:  Blood pressure 130/74, heart rate 64.  He is saturating 98% on  2 L.   HEENT:  Sclerae anicteric.  EOMI.  There is no xanthelasmas.  Mucous  membranes are moist.  NECK:  Supple.  JVP is about 6 cm of water.  Carotids are 2+ without any  bruits.  There is no lymphadenopathy or bruit.  CARDIAC:  He has a regular rate and rhythm with an S4.  There is no rub or  murmur.  LUNGS:  Clear to auscultation.  No wheezes or rales.  ABDOMEN:  Soft, nontender, nondistended.  No hepatosplenomegaly.  No bruits.  No masses.  Good bowel sounds.  EXTREMITIES:  Warm with no clubbing, cyanosis, edema.  Distal pulses are 1+  bilaterally.  There are no cords.  There is negative Homans' sign.   Point of care laboratories show a sodium 138, potassium 3.8, BUN 9,  creatinine 1.1.  CK 122, MB of 2.7, and troponin of less than 0.05.  EKG  shows sinus bradycardia, rate of 55 with no ST-T wave changes.  Chest x-ray  is negative.   ASSESSMENT/PLAN:  Chest pain.  His presentation is actually fairly  concerning for pulmonary embolus; however, he is hemodynamically quite  stable and thus I think large acute pulmonary embolus is unlikely.  He also  does have significant coronary disease.  At this point we will admit him for  rule out myocardial infarction with serial  cardiac markers.  We will check CT of the chest to rule out pulmonary  embolus.  If his CT is negative he will need cardiac catheterization on  Monday.  We will treat him with intravenous heparin and nitroglycerin,  continue his beta blocker, aspirin, and Statin.      Arvilla Meres, M.D. [facility]  Electronically Signed     DB/MEDQ  D:  12/24/2005  T:  12/26/2005  Job:  [id number]   cc:   Marcelino Duster, M.D.  Lenice Pressman, M.D.  [address]  [address]  [place]  [place] [address]

## 2011-04-06 ENCOUNTER — Emergency Department (HOSPITAL_COMMUNITY): Payer: Medicare Other

## 2011-04-06 ENCOUNTER — Inpatient Hospital Stay (HOSPITAL_COMMUNITY)
Admission: EM | Admit: 2011-04-06 | Discharge: 2011-04-08 | DRG: 251 | Disposition: A | Discharge status: Home / Self Care | Payer: Medicare Other | Attending: Cardiovascular Disease | Admitting: Cardiovascular Disease

## 2011-04-06 DIAGNOSIS — R079 Chest pain, unspecified: Secondary | ICD-10-CM

## 2011-04-06 DIAGNOSIS — I200 Unstable angina: Principal | ICD-10-CM | POA: Diagnosis present

## 2011-04-06 DIAGNOSIS — E785 Hyperlipidemia, unspecified: Secondary | ICD-10-CM | POA: Diagnosis present

## 2011-04-06 DIAGNOSIS — Z882 Allergy status to sulfonamides status: Secondary | ICD-10-CM

## 2011-04-06 DIAGNOSIS — I10 Essential (primary) hypertension: Secondary | ICD-10-CM | POA: Diagnosis present

## 2011-04-06 DIAGNOSIS — K589 Irritable bowel syndrome without diarrhea: Secondary | ICD-10-CM | POA: Diagnosis present

## 2011-04-06 DIAGNOSIS — K219 Gastro-esophageal reflux disease without esophagitis: Secondary | ICD-10-CM | POA: Diagnosis present

## 2011-04-06 DIAGNOSIS — N400 Enlarged prostate without lower urinary tract symptoms: Secondary | ICD-10-CM | POA: Diagnosis present

## 2011-04-06 DIAGNOSIS — I2510 Atherosclerotic heart disease of native coronary artery without angina pectoris: Secondary | ICD-10-CM | POA: Diagnosis present

## 2011-04-06 DIAGNOSIS — R0789 Other chest pain: Secondary | ICD-10-CM | POA: Diagnosis present

## 2011-04-06 DIAGNOSIS — J45909 Unspecified asthma, uncomplicated: Secondary | ICD-10-CM | POA: Diagnosis present

## 2011-04-06 LAB — CK TOTAL AND CKMB (NOT AT ARMC)
CK, MB: 4.1 ng/mL — ABNORMAL HIGH (ref 0.3–4.0)
Total CK: 119 U/L (ref 7–232)
Total CK: 112 U/L (ref 7–232)

## 2011-04-06 LAB — PROTIME-INR: Prothrombin Time: 13.7 seconds (ref 11.6–15.2)

## 2011-04-06 LAB — DIFFERENTIAL
Basophils Absolute: 0 10*3/uL (ref 0.0–0.1)
Basophils Relative: 1 % (ref 0–1)
Eosinophils Absolute: 0.5 10*3/uL (ref 0.0–0.7)
Monocytes Absolute: 0.8 10*3/uL (ref 0.1–1.0)
Neutro Abs: 4.1 10*3/uL (ref 1.7–7.7)
Neutrophils Relative %: 58 % (ref 43–77)

## 2011-04-06 LAB — POCT I-STAT, CHEM 8
Calcium, Ion: 1.24 mmol/L (ref 1.12–1.32)
Creatinine, Ser: 0.7 mg/dL (ref 0.50–1.35)
Hemoglobin: 12.2 g/dL — ABNORMAL LOW (ref 13.0–17.0)
Sodium: 140 mEq/L (ref 135–145)
TCO2: 26 mmol/L (ref 0–100)

## 2011-04-06 LAB — TROPONIN I: Troponin I: <0.3 ng/mL (ref <0.30)

## 2011-04-06 LAB — PRO B NATRIURETIC PEPTIDE: Pro B Natriuretic peptide (BNP): 362.8 pg/mL (ref 0–450)

## 2011-04-06 LAB — COMPREHENSIVE METABOLIC PANEL
ALT: 13 U/L (ref 0–53)
AST: 15 U/L (ref 0–37)
Albumin: 3.1 g/dL — ABNORMAL LOW (ref 3.5–5.2)
Alkaline Phosphatase: 78 U/L (ref 39–117)
CO2: 29 mEq/L (ref 19–32)
Chloride: 105 mEq/L (ref 96–112)
GFR calc non Af Amer: >60 mL/min (ref >60)
Potassium: 3.6 mEq/L (ref 3.5–5.1)
Sodium: 140 mEq/L (ref 135–145)
Total Bilirubin: 0.4 mg/dL (ref 0.3–1.2)

## 2011-04-06 LAB — CBC
Hemoglobin: 12.6 g/dL — ABNORMAL LOW (ref 13.0–17.0)
MCH: 30.7 pg (ref 26.0–34.0)
MCHC: 34.2 g/dL (ref 30.0–36.0)
MCHC: 33.7 g/dL (ref 30.0–36.0)
MCV: 91 fL (ref 78.0–100.0)
Platelets: 179 10*3/uL (ref 150–400)
Platelets: 201 10*3/uL (ref 150–400)

## 2011-04-06 LAB — PLATELET INHIBITION P2Y12: Platelet Function Baseline: 313 [PRU] (ref 194–418)

## 2011-04-07 DIAGNOSIS — I2510 Atherosclerotic heart disease of native coronary artery without angina pectoris: Secondary | ICD-10-CM

## 2011-04-07 LAB — CBC
HCT: 35.4 % — ABNORMAL LOW (ref 39.0–52.0)
MCV: 89.6 fL (ref 78.0–100.0)
Platelets: 196 10*3/uL (ref 150–400)
RBC: 3.95 MIL/uL — ABNORMAL LOW (ref 4.22–5.81)
RDW: 13.2 % (ref 11.5–15.5)
WBC: 7.2 10*3/uL (ref 4.0–10.5)

## 2011-04-07 LAB — TSH: TSH: 1.536 u[IU]/mL (ref 0.350–4.500)

## 2011-04-07 LAB — POCT ACTIVATED CLOTTING TIME: Activated Clotting Time: 391 seconds

## 2011-04-07 LAB — MRSA PCR SCREENING: MRSA by PCR: NEGATIVE

## 2011-04-08 DIAGNOSIS — I200 Unstable angina: Secondary | ICD-10-CM

## 2011-04-08 LAB — BASIC METABOLIC PANEL
Chloride: 106 mEq/L (ref 96–112)
GFR calc Af Amer: >60 mL/min (ref >60)
GFR calc non Af Amer: >60 mL/min (ref >60)
Potassium: 3.8 mEq/L (ref 3.5–5.1)
Sodium: 139 mEq/L (ref 135–145)

## 2011-04-08 LAB — CBC
HCT: 34.3 % — ABNORMAL LOW (ref 39.0–52.0)
Hemoglobin: 11.7 g/dL — ABNORMAL LOW (ref 13.0–17.0)
MCHC: 34.1 g/dL (ref 30.0–36.0)
RDW: 13.4 % (ref 11.5–15.5)
WBC: 6.8 10*3/uL (ref 4.0–10.5)

## 2011-04-18 NOTE — H&P (Signed)
[name], [name] NO.:  [id number]  MEDICAL RECORD NO.:  [id number]  LOCATION:  2009                         FACILITY:  [facility]  PHYSICIAN:  Rollene Rotunda, MD, FACCDATE OF BIRTH:  January 01, 1931  DATE OF ADMISSION:  04/06/2011 DATE OF DISCHARGE:                             HISTORY & PHYSICAL   PRIMARY CARE PHYSICIAN:  Dr. Lorin Picket.  PRIMARY CARDIOLOGIST:  Veverly Fells. Excell Seltzer, MD and Pricilla Riffle, MD, [facility]  REASON FOR PRESENTATION:  Evaluate the patient with chest pain.  HISTORY OF PRESENT ILLNESS:  The patient is 77 years old.  He had a history of coronary artery disease as described below.  Compiling this, he also has a history of chronic chest pain and esophageal spasm. However, recently he had a cardiac catheterization with results described.  There was a diagonal lesion and it was decided to try to manage this medically unless he had recurrent pain.  He had one episode of chest discomfort last Thursday.  He returns today with chest pain starting this morning.  It is left-sided and radiating down his left arm.  It has been somewhat constant though it has improved with nitrates that he took at home.  It has been moderate.  It has been a squeezing sensation similar to previous angina.  He can make it worse or better other than with the nitroglycerin.  There has been some mild nausea, but no vomiting.  There has been some mild shortness of breath, but he is not describing any new PND or orthopnea.  He is not having any new palpitations, presyncope, or syncope.  He thinks he has had a decreased exercise tolerance progressively.  In the emergency room, he was not found to have any acute EKG changes.  Enzymes were negative x1.  PAST MEDICAL HISTORY: 1. Hypertension. 2. Hyperlipidemia. 3. Chest pain, chronic. 4. Gastroesophageal reflux disease. 5. Asthma. 6. Esophageal spasm. 7. Coronary artery disease (catheterization March 2012, EF 65%, LAD     30% to 40%  stenosis, diagonal 70% to 75% stenosis, circumflex 25%     stenosis.  Right coronary artery with a patent stent). 8. Benign prostatic hypertrophy. 9. Irritable bowel syndrome.  PAST SURGICAL HISTORY:  Cervical spine surgery, hemorrhoid surgery, cataract surgery.  ALLERGIES/INTOLERANCES:  BUDESONIDE, DEMEROL, LYRICA, NAPROXEN, SULFA.  MEDICATIONS:  Aspirin, Imdur, doxazosin 4 mg at bedtime, fexofenadine, albuterol, Nexium 40 mg daily, Flovent, Levsin, metoprolol 50 mg daily, Singulair.  SOCIAL HISTORY:  The patient is a widower.  He has 5 children.  He has never smoked cigarettes.  FAMILY HISTORY:  Noncontributory for early coronary artery disease though there was heart disease in the later years for first-degree relatives.  REVIEW OF SYSTEMS:  As stated in the HPI, positive for some difficulty with balance and he walks with a cane.  Otherwise as stated and negative for all other systems.  PHYSICAL EXAMINATION:  GENERAL:  The patient is pleasant and in no distress. VITAL SIGNS:  Blood pressure 136/76, heart rate 60 and regular, afebrile, respiratory rate 18. HEENT:  Eyes unremarkable.  Pupils equal, round, and reactive to light. Fundi not visualized.  Oral mucosa unremarkable.  Very poor dentition. NECK:  No  jugular venous distention at 45 degrees.  Carotid upstroke brisk and symmetrical.  No bruits, no thyromegaly. LYMPHATICS:  No cervical, axillary, or inguinal adenopathy. LUNGS:  Clear to auscultation bilaterally. BACK:  No costovertebral angle tenderness. CHEST:  Unremarkable. HEART:  PMI not displaced or sustained.  S1 and S2 within normal limits. No S3, no S4, no clicks, no rubs, no murmurs. ABDOMEN:  Flat, positive bowel sounds, normal in frequency and pitch. No bruits, no rebound, no guarding, no midline pulsatile mass, no hepatomegaly, no splenomegaly. SKIN:  No rashes, no nodules. EXTREMITIES:  2+ pulses throughout.  No edema, no cyanosis, no clubbing. NEURO:   Oriented to person, place, and time.  Cranial nerves II through XII grossly intact.  Motor grossly intact.  EKG, sinus rhythm, sinus bradycardia, rate PVCs, no acute ST-T wave changes.  Pro-BNP 362, CK-MB 112/3.8, troponin less than 0.03.  WBC 7.1, hemoglobin 12.6, platelets 179.  Sodium 140, potassium 4.0, BUN 11, creatinine 0.7.  Chest x-ray, mild cardiomegaly with questionable vascular congestion.  ASSESSMENT/PLAN: 1. Chest:  The patient's chest discomfort has features consistent with     unstable angina.  He has a known diagonal lesion, which was going     to be treated percutaneously if he had recurrent symptoms.  For     now, I will admit him to the hospital and put him on nitrates,     heparin.  He will continue aspirin, beta-blockers, and Plavix.  I     plan for     him to have elective PCI by Dr. Excell Seltzer and I will have him review     this. 2. Risk reduction.  Looking back at the old records and he was on     Lipitor 40 mg at one point, I do not see where this was     discontinued or why and I will restart this as I do not see it on     his current list.     Rollene Rotunda, MD, [facility]     JH/MEDQ  D:  04/06/2011  T:  04/07/2011  Job:  [id number]  cc:   Pricilla Riffle, MD, [facility] Veverly Fells. Excell Seltzer, MD Dr. Lorin Picket  Electronically Signed by Rollene Rotunda MD [facility] on 04/18/2011 02:27:14 PM

## 2011-04-25 NOTE — Cardiovascular Report (Signed)
NAMEULISSES, [name] NO.:  [id number]  MEDICAL RECORD NO.:  [id number]           PATIENT TYPE:  O  LOCATION:  CATH                         FACILITY:  [facility]  PHYSICIAN:  Veverly Fells. Excell Seltzer, MD  DATE OF BIRTH:  1931-05-19  DATE OF PROCEDURE: DATE OF DISCHARGE:                           CARDIAC CATHETERIZATION   PROCEDURES: 1. Left heart catheterization. 2. Selective coronary angiography. 3. Left ventricular angiography.  PROCEDURAL INDICATIONS:  Mr. Malerba is a 77 year old gentleman who presented with chest pain.  He has had multiple cardiac catheterization procedures.  His most recent PCI procedure was in 2007, when he underwent cutting balloon angioplasty of diagonal branch ostium.  He presented to see Dr. Tenny Craw with recurrent symptoms that were similar to those of his angina in the past.  He was referred directly for cardiac cath.  Risks and indications of procedure were reviewed with the patient. Informed consent was obtained.  The right groin was prepped and draped and anesthetized with 1% lidocaine using modified Seldinger technique. A 4-French sheath was placed in the right femoral artery.  Standard Judkins catheters were used for coronary angiography and left ventriculography.  Catheter exchanges were performed over guidewire. The patient tolerated the procedure well.  There were no immediate complications.  PROCEDURAL FINDINGS:  Aortic pressure 98/43 with a mean of 69, left ventricular pressure is 101/14.  Left ventriculography is normal.  The ejection fraction is estimated at 65%.  There are no regional wall motion abnormalities.  There is no significant mitral regurgitation.  Coronary angiography.  The left mainstem is widely patent.  The vessel divides into the LAD in left circumflex.  LAD.  The LAD is patent throughout its course.  There is minor nonobstructive stenosis proximally, mid LAD at the origin of a moderately large first  diagonal branch, has 30-40% stenosis.  There is nonobstructive plaque in the mid LAD and the distal LAD is widely patent and reaches the apex.  There is a moderate-to-large caliber diagonal branch that supplies multiple sub-branches.  This vessel has a 70-75% ostial narrowing.  I compared this to previous studies and is essentially unchanged.  There has been a little bit of progression since his cutting balloon angioplasty was performed in 2007, that disease does not appear critical in nature.  Left circumflex.  The circumflex is widely patent.  It gives off 2 OM branches.  The mid circumflex has 25-30% stenosis.  Right coronary artery.  The right coronary artery is patent.  There is a focal 30-40% stenosis present.  There is a stented segment throughout the midcircumflex that is patent throughout.  The distal circumflex has no significant obstructive disease and gives off a PDA branch and posterolateral branch.  FINAL ASSESSMENT: 1. Moderately tight ostial diagonal stenosis. 2. Nonobstructive left anterior descending coronary artery and left     circumflex stenosis. 3. Patent right coronary stents with nonobstructive RCA stenosis. 4. Normal left ventricular systolic function.  RECOMMENDATIONS:  I would recommend continued medical therapy.  If the patient has refractory angina, it would be reasonable to assess the diagonal territory for ischemia with a nuclear stress test or  flow wire. His options would probably be limited to repeat cutting balloon angioplasty based on the anatomic distribution of his disease and I am not sure he would have a good long-term result with cutting balloon angioplasty alone.     Veverly Fells. Excell Seltzer, MD     MDC/MEDQ  D:  01/05/2011  T:  01/06/2011  Job:  [id number]  cc:   Pricilla Riffle, MD, [facility]  Electronically Signed by Tonny Bollman MD on 01/17/2011 01:08:40 PM

## 2011-04-26 ENCOUNTER — Ambulatory Visit (INDEPENDENT_AMBULATORY_CARE_PROVIDER_SITE_OTHER): Payer: Medicare Other | Admitting: Physician Assistant

## 2011-04-26 ENCOUNTER — Encounter: Payer: Self-pay | Admitting: Physician Assistant

## 2011-04-26 VITALS — BP 134/72 | HR 81 | Resp 18 | Ht 71.0 in | Wt 170.1 lb

## 2011-04-26 DIAGNOSIS — I10 Essential (primary) hypertension: Secondary | ICD-10-CM

## 2011-04-26 DIAGNOSIS — I2510 Atherosclerotic heart disease of native coronary artery without angina pectoris: Secondary | ICD-10-CM

## 2011-04-26 DIAGNOSIS — E785 Hyperlipidemia, unspecified: Secondary | ICD-10-CM

## 2011-04-26 MED ORDER — LIPITOR 40 MG PO TABS: 40.0000 mg | ORAL_TABLET | Freq: Every day | ORAL | Status: DC

## 2011-04-26 NOTE — Assessment & Plan Note (Addendum)
BP could be better.  Monitor for now.  Make adjustments as necessary.

## 2011-04-26 NOTE — Progress Notes (Signed)
History of Present Illness: Primary Cardiologist:  Dr. Dietrich Pates  [name] is a 77 y.o. male who presents for post hospital follow up.  He has a h/o CAD, s/p stent to the RCA in the past as well as cutting balloon angioplasty to the Dx.  He has chronic chest pain and was recently admitted for recurrent symptoms 6/20-6/22.  MI was r/o and cardiac cath demonstrated oDx 80%, mLAD 50% with 30-40% at the level of the Dx, CFX 30%, pRCA 25% with patent stent.  He had cutting balloon PTCA done again to the Dx.   Dr. Excell Seltzer did his intervention and he felt that the patient should be continued on dual antiplatelet therapy and aggressive medical management.  He noted that the patient has had chronic chest pain and many catheterization procedures.  If he has recurrent chest pain, he should have a nuclear stress test.  Dr. Excell Seltzer did not think there was much more that could be safely done with his diagonal ostium without jeopardizing the LAD.   He did feel that significant ischemia should be proven before considering repeat PCI.    He is doing well.  Has more energy.  No chest pain.  No dyspnea.  No orthopnea, PND or edema.  No syncope.  Has a lot of gas with generic atorvastatin.  Did much better with brand name Lipitor.  Will send a new Rx to his pharmacy, [facility].  Past Medical History  Diagnosis Date  . Hypertension   . Hyperlipidemia   . Chronic chest pain   . Dyspnea     chronic  . GERD (gastroesophageal reflux disease)     h/o esophageal spasm  . Asthma   . Back pain   . Coronary artery disease     a. s/p BMS to RCA in 2002; b. s/p cutting balloon POBA ;   c. cath 6/12: oDx 80% (treated with repeat cutting balloon POBA), mLAD 50% with 30-40% at Dx, CFX 30%, pRCA 25% with patent stents    Current Outpatient Prescriptions  Medication Sig Dispense Refill  . albuterol (PROVENTIL, VENTOLIN) (5 MG/ML) 0.5% NEBU Take by nebulization as needed.        Marland Kitchen aspirin 325 MG EC tablet Take 325 mg by mouth  daily.        . clopidogrel (PLAVIX) 75 MG tablet Take 75 mg by mouth daily.        Marland Kitchen doxazosin (CARDURA) 4 MG tablet Take 4 mg by mouth at bedtime.        Marland Kitchen esomeprazole (NEXIUM) 40 MG capsule Take 40 mg by mouth 2 (two) times daily.        . fexofenadine (ALLEGRA) 180 MG tablet Take 180 mg by mouth daily.        . fluticasone (FLOVENT HFA) 220 MCG/ACT inhaler Inhale 2 puffs into the lungs 2 (two) times daily.        . furosemide (LASIX) 20 MG tablet Take 20 mg by mouth daily.        Marland Kitchen gabapentin (NEURONTIN) 100 MG capsule Take 100 mg by mouth as needed.        . hyoscyamine (LEVSIN, ANASPAZ) 0.125 MG tablet Take 0.125 mg by mouth 3 (three) times daily.        . isosorbide mononitrate (IMDUR) 60 MG 24 hr tablet Take 60 mg by mouth daily.        . metoprolol (LOPRESSOR) 50 MG tablet Take 50 mg by mouth. Take 1 1/2 tablets  twice a day       . montelukast (SINGULAIR) 10 MG tablet Take 10 mg by mouth at bedtime.        . nitroGLYCERIN (NITROSTAT) 0.4 MG SL tablet Place 0.4 mg under the tongue every 5 (five) minutes as needed.        Marland Kitchen LIPITOR 40 MG tablet Take 1 tablet (40 mg total) by mouth daily.  30 tablet  11  . DISCONTD: amLODipine-atorvastatin (CADUET) 5-40 MG per tablet Take 1 tablet by mouth daily.        Marland Kitchen DISCONTD: doxazosin (CARDURA) 4 MG tablet Take 1 mg by mouth at bedtime.          Allergies: Allergies  Allergen Reactions  . Meperidine Hcl   . Simvastatin   . Sulfonamide Derivatives   . Symbicort     REACTION: Mouth and tongue swelling    Vital Signs: BP 134/72  Pulse 81  Resp 18  Ht 5\' 11"  (1.803 m)  Wt 170 lb 1.9 oz (77.166 kg)  BMI 23.73 kg/m2  PHYSICAL EXAM: Well nourished, well developed, in no acute distress HEENT: normal Neck: no JVD Cardiac:  normal S1, S2; RRR; no murmur Lungs:  clear to auscultation bilaterally, no wheezing, rhonchi or rales Abd: soft, nontender, no hepatomegaly Ext: no edema; right radial site without hematoma or bruit Skin: warm and  dry Neuro:  CNs 2-12 intact, no focal abnormalities noted  EKG:  Sinus rhythm, heart rate 67, normal axis, no ischemic changes  ASSESSMENT AND PLAN:

## 2011-04-26 NOTE — Patient Instructions (Addendum)
Your physician recommends that you schedule a follow-up appointment in: 07/28/11 @ 11:45 to see Dr. Tenny Craw  Your physician recommends that you return for lab work in: 07/25/11 for FASTING LIVER AND LIPID PANEL

## 2011-04-26 NOTE — Assessment & Plan Note (Signed)
Change to brand name Lipitor 40 mg.  Check FLP and LFTs in 3 months.  If LDL not below 70, increase to 80 mg QD.

## 2011-04-26 NOTE — Assessment & Plan Note (Signed)
Doing well post POBA of the diagonal.  Continue aggressive medical therapy.  Continue ASA and Plavix.  P2Y12 inhibition was only 11% in the hospital.  I spoke with Dr. Excell Seltzer and we feel it is ok to keep him on Plavix.  Stents were put in 10 years ago and his bleeding risk is high with medicines like Effient or Brillinta.  Follow up with Dr. Tenny Craw in 3 months.

## 2011-05-05 NOTE — Cardiovascular Report (Signed)
[name], [name] NO.:  [id number]  MEDICAL RECORD NO.:  [id number]  LOCATION:  2924                         FACILITY:  [facility]  PHYSICIAN:  [name] Fells. Excell Seltzer, MD  DATE OF BIRTH:  1931/08/16  DATE OF PROCEDURE:  04/07/2011 DATE OF DISCHARGE:                           CARDIAC CATHETERIZATION   PROCEDURE: 1. Selective coronary angiography. 2. Percutaneous transluminal coronary angioplasty of the first     diagonal.  PROCEDURAL INDICATIONS:  [name] is a 77 year old gentleman with chronic angina.  He has had many cardiac cath procedures and has undergone previous stenting of the right coronary artery and cutting balloon angioplasty of the diagonal.  His most recent cardiac catheterization was earlier this year and it showed patent stents in the right coronary with moderately tight ostial diagonal stenosis.  We elected to treat him medically but the patient has returned with symptoms compelling for unstable angina.  His EKG and enzymes are negative.  He was referred for cardiac cath and probable PCI.  Risks and indications of the procedure were reviewed with the patient. Informed consent was obtained.  The right wrist was prepped, draped and anesthetized with 1% lidocaine.  Using modified Seldinger technique, a 6- French sheath was placed in the right radial artery and Ikari 3.5-cm guide catheter was inserted.  The Ikari guide was used to image the right and left coronary arteries.  His cardiac cath findings were stable from previous study with an 80% ostial diagonal stenosis involving a large diagonal branch.  There was nonobstructive disease elsewhere.  We elected to proceed with PCI.  Of note, as soon as radial access was obtained, we started Angiomax for anticoagulation and gave 3 mg of verapamil through the radial sheath.  Shortly following diagnostic angiography, the patient developed hypotension.  His blood pressure upon beginning of the  procedure was approximately 145/70 and his heart rate increased from 60 to about 100 and his blood pressure dropped to nearly 60s systolic.  There was really no explanation for this as his coronary flow was unchanged and we put up a 6-lead EKG on our monitor with absolutely no EKG changes.  The patient had no chest pain or other complaints.  He was supported with IV dopamine up to 10 mcg/minute and IV fluids.  His nitroglycerin was stopped.  His blood pressure slowly came back to about 100 mmHg systolic and we decided to move forward with PCI at that point.  Once a therapeutic ACT was achieved, a cougar guidewire was advanced into the first diagonal branch.  The lesion was dilated with a 2.5 x 10- mm cutting balloon which was taken to 6 and then 8 atmospheres on two separate inflations.  Angiography demonstrated significant vessel recoil at the diagonal ostium and it was re-dilated with a 3.0 x 12-mm [place] Quantum apex balloon which was taken to 10 atmospheres for a 60-second inflation.  The patient had fairly marked ST-segment elevation with all of these balloon inflations because they occluded flow down both the LAD and diagonal branches.  Final angiography demonstrated an adequate result with approximately 30% residual ostial stenosis.  The wire was removed and intracoronary nitroglycerin was administered.  There was  no plaque shift into the LAD appreciated.  The patient tolerated the procedure well.  There was TIMI 3 flow both pre and post.  PROCEDURAL FINDINGS:  The RCA is dominant.  It is a patent vessel with 25% proximal stenosis, widely patent stents throughout the midportion of the vessel, and patent branch vessels in the PDA and posterolateral branches.  The left mainstem is patent.  It divides into the LAD and left circumflex.  The patient has a short left main.  LAD.  The LAD is patent to the apex.  The vessel has 50% mid stenosis. There is also plaquing around the area of  the first diagonal but the LAD stenosis is only about 30-40% through this region.  The diagonal has an 80% ostial stenosis that is really only seen in a steep LAO cranial projection is not appreciated in other views.  It is a moderate-to-large size diagonal.  Left circumflex.  The left circumflex is fairly large in caliber.  It supplies two OMs with diffuse nonobstructive disease with up to 30% stenosis.  FINAL ASSESSMENT: 1. Severe ostial diagonal stenosis with successful cutting balloon     angioplasty. 2. Continued patency of the right coronary artery stents. 3. Nonobstructive left anterior descending and left circumflex     stenosis.  RECOMMENDATIONS:  The patient should be continued on dual antiplatelet therapy and aggressive medical management.  This patient has had chronic chest pain and many catheterization procedures.  I suspect he will have recurrent chest pain and would consider nuclear stress testing if this occurs.  I really do not think there is much more that could be safely done with his diagonal ostium without jeopardizing the LAD.  I think that significant ischemia should be proven before considering repeat PCI.  Hopefully, he will have a good response and be angina free.     [name] Fells. Excell Seltzer, MD     MDC/MEDQ  D:  04/07/2011  T:  04/07/2011  Job:  [id number]  cc:   [name] Riffle, MD, [facility]  Electronically Signed by [name] Bollman MD on 05/05/2011 12:29:11 AM

## 2011-05-12 NOTE — Discharge Summary (Signed)
[name], [name] NO.:  [id number]  MEDICAL RECORD NO.:  [id number]  LOCATION:  2924                         FACILITY:  [facility]  PHYSICIAN:  Pricilla Riffle, MD, FACCDATE OF BIRTH:  1931-09-24  DATE OF ADMISSION:  04/06/2011 DATE OF DISCHARGE:  04/08/2011                              DISCHARGE SUMMARY   PRIMARY CARDIOLOGIST:  Pricilla Riffle, MD, [facility]  PRIMARY CARE PROVIDER:  Dr. Lorin Picket.  DISCHARGE DIAGNOSIS:  Unstable angina.  SECONDARY DIAGNOSES: 1. Coronary artery disease status post cutting balloon angioplasty of     the first diagonal this admission. 2. Hypertension. 3. Hyperlipidemia. 4. Chronic noncardiac chest pain. 5. Gastroesophageal reflux disease. 6. Asthma. 7. History of esophageal spasm. 8. Benign prostatic hypertrophy. 9. Irritable bowel syndrome. 10.History of cervical spine surgery. 11.Status post hemorrhoid surgery. 12.Status post cataract surgery.  ALLERGIES: 1. BUDESONIDE. 2. DEMEROL. 3. LYRICA. 4. NAPROXEN. 5. SULFA. 6. STATIN.  PROCEDURES:  Left heart cardiac catheterization revealing 80% stenosis in the ostial first diagonal with otherwise nonobstructive disease and patent RCA stents.  The first diagonal was successfully treated with cutting balloon angioplasty.  HISTORY OF PRESENT ILLNESS:  A 77 year old male with prior history of CAD as well as chronic noncardiac chest pain who recently underwent diagnostic catheterization in March 2012 revealing 70-75% ostial narrowing in the first diagonal, but otherwise nonobstructive disease and patent RCA stents.  Recommendation was for medical management. Unfortunately, the patient developed recurrent chest discomfort on June 20 prompting return to the [facility] ED where ECG was nonacute and enzymes were negative.  He was admitted for further evaluation.  HOSPITAL COURSE:  The patient was ruled out for MI.  He did have recurrent chest discomfort and was placed on IV  nitroglycerin with improved symptoms.  He was taken to the cath lab on June 21 where diagnostic catheterization revealed patent RCA stents with an 80% stenosis in the ostial first diagonal and otherwise nonobstructive disease.  Attention was turned to the ostial first diagonal.  This was treated cutting balloon angioplasty.  Of note, following the diagnostic procedure, the patient developed hypotension requiring dopamine infusion.  He did not have any chest pain or EKG changes during that period.  It was felt this likely represented a vagal event.  Following percutaneous intervention, the patient has been feeling well without recurrent chest pain.  He has been ambulating without difficulty and will be discharged home today in good condition.  DISCHARGE LABORATORY DATA:  Hemoglobin 11.7, hematocrit 34.3, WBC 6.8, and platelets 186.  Sodium 139, potassium 3.8, chloride 106, CO2 of 28, BUN 11, creatinine 0.70, and glucose 95.  Total bilirubin 0.4, alkaline phosphatase 78, AST 15, ALT 13, total protein 5.0, albumin 3.1, and calcium 8.8.  CK 112, MB 3.8, and troponin I less than 0.30.  TSH 1.536. MRSA screen was negative.  DISPOSITION:  The patient will be discharged home today in good condition.  FOLLOWUP PLANS AND APPOINTMENTS:  The patient will follow up with Tereso Newcomer, PA, at [facility] on July 10 at 12:00 p.m.  He will follow with his primary care provider as previously scheduled.  DISCHARGE MEDICATIONS: 1. Aspirin 81 mg daily. 2. Nitroglycerin  0.4 mg sublingual p.r.n. chest pain. 3. Albuterol inhaler 2 puffs b.i.d. p.r.n. 4. Plavix 75 mg daily. 5. Doxazosin 4 mg at bedtime. 6. Fexofenadine 180 mg daily. 7. Flovent 220 mcg 1 puff b.i.d. 8. Lasix 20 mg daily p.r.n. fluid retention. 9. Gabapentin 100 mg daily p.r.n. 10.Hyoscyamine 0.125 mg t.i.d. 11.Imdur 60 mg daily. 12.Metoprolol 50 mg one and half tablet b.i.d. 13.Nexium 40 mg b.i.d.  OUTSTANDING LABORATORY  STUDIES:  None.  DURATION OF DISCHARGE ENCOUNTER:  40 minutes including physician time.     Nicolasa Ducking, ANP   ______________________________ Pricilla Riffle, MD, [facility]    CB/MEDQ  D:  04/08/2011  T:  04/09/2011  Job:  [id number]  cc:   Dr. Lorin Picket  Electronically Signed by Nicolasa Ducking ANP on 05/03/2011 04:46:46 PM Electronically Signed by Dietrich Pates MD [facility] on 05/12/2011 11:53:42 PM

## 2011-06-26 ENCOUNTER — Emergency Department (HOSPITAL_COMMUNITY): Payer: Medicare Other

## 2011-06-26 ENCOUNTER — Inpatient Hospital Stay (HOSPITAL_COMMUNITY)
Admission: EM | Admit: 2011-06-26 | Discharge: 2011-06-28 | DRG: 313 | Disposition: A | Discharge status: Home / Self Care | Payer: Medicare Other | Source: Ambulatory Visit | Attending: Internal Medicine | Admitting: Internal Medicine

## 2011-06-26 DIAGNOSIS — E785 Hyperlipidemia, unspecified: Secondary | ICD-10-CM | POA: Diagnosis present

## 2011-06-26 DIAGNOSIS — R079 Chest pain, unspecified: Secondary | ICD-10-CM

## 2011-06-26 DIAGNOSIS — Z7902 Long term (current) use of antithrombotics/antiplatelets: Secondary | ICD-10-CM

## 2011-06-26 DIAGNOSIS — I10 Essential (primary) hypertension: Secondary | ICD-10-CM | POA: Diagnosis present

## 2011-06-26 DIAGNOSIS — I2510 Atherosclerotic heart disease of native coronary artery without angina pectoris: Secondary | ICD-10-CM | POA: Diagnosis present

## 2011-06-26 DIAGNOSIS — Z9861 Coronary angioplasty status: Secondary | ICD-10-CM

## 2011-06-26 DIAGNOSIS — N400 Enlarged prostate without lower urinary tract symptoms: Secondary | ICD-10-CM | POA: Diagnosis present

## 2011-06-26 DIAGNOSIS — Z7982 Long term (current) use of aspirin: Secondary | ICD-10-CM

## 2011-06-26 DIAGNOSIS — J45909 Unspecified asthma, uncomplicated: Secondary | ICD-10-CM | POA: Diagnosis present

## 2011-06-26 DIAGNOSIS — K219 Gastro-esophageal reflux disease without esophagitis: Secondary | ICD-10-CM | POA: Diagnosis present

## 2011-06-26 DIAGNOSIS — R0789 Other chest pain: Principal | ICD-10-CM | POA: Diagnosis present

## 2011-06-26 LAB — BASIC METABOLIC PANEL
Calcium: 9.5 mg/dL (ref 8.4–10.5)
GFR calc Af Amer: >60 mL/min (ref >60)
GFR calc non Af Amer: >60 mL/min (ref >60)
Potassium: 4.5 mEq/L (ref 3.5–5.1)
Sodium: 138 mEq/L (ref 135–145)

## 2011-06-26 LAB — DIFFERENTIAL
Basophils Absolute: 0 10*3/uL (ref 0.0–0.1)
Basophils Relative: 1 % (ref 0–1)
Lymphocytes Relative: 27 % (ref 12–46)
Monocytes Relative: 9 % (ref 3–12)
Neutro Abs: 4 10*3/uL (ref 1.7–7.7)
Neutrophils Relative %: 56 % (ref 43–77)

## 2011-06-26 LAB — CK TOTAL AND CKMB (NOT AT ARMC)
CK, MB: 4.8 ng/mL — ABNORMAL HIGH (ref 0.3–4.0)
Relative Index: 4.1 — ABNORMAL HIGH (ref 0.0–2.5)
Total CK: 117 U/L (ref 7–232)

## 2011-06-26 LAB — URINALYSIS, ROUTINE W REFLEX MICROSCOPIC
Hgb urine dipstick: NEGATIVE
Leukocytes, UA: NEGATIVE
Nitrite: NEGATIVE
Protein, ur: NEGATIVE mg/dL
Urobilinogen, UA: 0.2 mg/dL (ref 0.0–1.0)

## 2011-06-26 LAB — CBC
HCT: 38.7 % — ABNORMAL LOW (ref 39.0–52.0)
Hemoglobin: 13.2 g/dL (ref 13.0–17.0)
MCH: 30.6 pg (ref 26.0–34.0)
RBC: 4.31 MIL/uL (ref 4.22–5.81)

## 2011-06-26 LAB — PRO B NATRIURETIC PEPTIDE: Pro B Natriuretic peptide (BNP): 399.6 pg/mL (ref 0–450)

## 2011-06-27 DIAGNOSIS — I2510 Atherosclerotic heart disease of native coronary artery without angina pectoris: Secondary | ICD-10-CM

## 2011-06-27 LAB — CARDIAC PANEL(CRET KIN+CKTOT+MB+TROPI)
Relative Index: INVALID (ref 0.0–2.5)
Relative Index: INVALID (ref 0.0–2.5)
Troponin I: <0.3 ng/mL (ref <0.30)

## 2011-06-27 NOTE — H&P (Signed)
NAMEAAYDEN, CEFALU NO.:  [id number]  MEDICAL RECORD NO.:  [id number]  LOCATION:  3733                         FACILITY:  [facility]  PHYSICIAN:  Zacarias Pontes, MD       DATE OF BIRTH:  Oct 13, 1931  DATE OF ADMISSION:  06/26/2011 DATE OF DISCHARGE:                             HISTORY & PHYSICAL   PRIMARY CARDIOLOGIST:  Pricilla Riffle, MD, [facility]  PRIMARY CARE PHYSICIAN:  Dr. Lorin Picket.  CHIEF COMPLAINT:  Chest pain.  HISTORY OF PRESENT ILLNESS:  [name] is a 77 year old gentleman with a history of coronary artery disease requiring PCI to his RCA in the past and recent cutting balloon angioplasty to his LADD1.  Cutting balloon angioplasty to his LADD1 was performed in May 2012.  He now presents with 2 chest pain episodes in the past 24 hours.  Last night, he experienced central chest pressure prior to bed for which he took a sublingual nitroglycerin.  He experienced relief and was able to sleep comfortably for the remainder of the night.  Again today, he had central chest tightness together with nausea, with inadequate relief from several nitroglycerin doses.  He denies any associated shortness of breath with this episode and also denies diaphoresis.  He denies palpitations and denies any syncope.  Given his symptoms and his concern for his heart,  he presented to the emergency room for further evaluation.  PAST MEDICAL HISTORY: 1. Coronary disease status post PCI to his RCA as well as cutting     balloon angioplasty to his LAD D1 in May 2012. 2. Hypertension. 3. Hyperlipidemia. 4. GERD. 5. Asthma. 6. Esophageal spasm. 7. BPH. 8. Irritable bowel syndrome.  SOCIAL HISTORY:  He is a nonsmoker and is widowed.  He has 5 children.  FAMILY HISTORY:  Noncontributory.  ALLERGIES: 1. BUDESONIDE. 2. DEMEROL. 3. LYRICA. 4. NAPROXEN. 5. SULFA.  MEDICATIONS: 1. Aspirin 81 mg daily. 2. Imdur 60 mg p.o. daily. 3. Clopidogrel 75 mg p.o. daily. 4. Metoprolol 75  mg p.o. b.i.d.  PHYSICAL EXAMINATION:  VITAL SIGNS:  The patient has a heart rate of 75 with a blood pressure of 164/92.  He is breathing at a rate of 15 per minute, an oxygen saturation of 98% on 2 L nasal cannula. GENERAL:  He is in no acute distress and is not using accessory muscles for breathing. HEENT:  Notable for a supple neck with no masses or lymphadenopathy. His JVP is not appreciably elevated. CARDIAC:  Notable for a normal S1, S2 that is regular in rate with no murmurs or rubs appreciated. LUNGS:  Clear to auscultation bilaterally with no crackles or wheezing appreciated. ABDOMEN:  Soft, nontender, nondistended with positive bowel sounds and no abdominal bruits. EXTREMITIES:  Warm and well-perfused with no lower extremity edema. NEUROLOGIC:  He is alert and oriented x3 with no focal neurologic deficits detected.  LABORATORY EVALUATION:  His white count is 7, hematocrit 38, platelets 219,000.  Sodium 138, potassium 4.5, chloride 103, bicarb 27, BUN 12, creatinine 0.7, glucose of 98.  ProBNP was checked and resulted at 399. Initial set of cardiac enzymes revealed a CK of 117, a CK-MB of 4.8, and troponin of 0.0.  An ECG performed in the emergency room revealed normal sinus rhythm with no ST-segment deviation.    Cardiac catheterization in May 2012 revealed a 25% proximal lesion in his RCA with patent stents in the midportion, a 50% lesion in the mid LAD, and an 80% lesion in the ostial LADD1 for which he underwent cutting balloon angioplasty.  The left circumflex had a 30% lesion.  IMPRESSION:  This is a 77 year old gentleman with known coronary artery disease presenting with 2 chest pain episodes in 24 hours with no current enzymatic or electrocardiographic evidence of ischemia.  Given his chronic chest pain as well as his challenging coronary anatomy as described above, an initial noninvasive approach may be prudent so long as there is not evolution in his enzymes  or ECGs.  We will admit him to telemetry for serial enzymes and ECG acquisition. We will continue his dual antiplatelet therapy in the form of aspirin and clopidogrel.  We will also continue his beta blockade, his statin, and his oral nitrate.  After his cutting angioplasty in May 2012, it was noted that he does not have coronary anatomy that is easily amenable to revascularization, at least in its form at the time of that cath.  As a result, so long as his symptoms, ECGs, and enzymes do not evolve, it would be helpful to obtain a noninvasive stress test to better determine whether he has any significant inducible ischemia prior to any trip to the catheterization lab.  The plan was explained to the patient and his family and they voiced understanding.  Their questions were answered to the best of my ability.         ______________________________ Zacarias Pontes, MD    DM/MEDQ  D:  Dec 17, 202012  T:  Dec 17, 202012  Job:  [id number]  Electronically Signed by Zacarias Pontes MD on Dec 17, 202012 04:13:36 AM

## 2011-06-28 ENCOUNTER — Inpatient Hospital Stay (HOSPITAL_COMMUNITY): Payer: Medicare Other

## 2011-06-28 DIAGNOSIS — R079 Chest pain, unspecified: Secondary | ICD-10-CM

## 2011-06-28 LAB — LIPID PANEL
LDL Cholesterol: 100 mg/dL — ABNORMAL HIGH (ref 0–99)
Triglycerides: 82 mg/dL (ref <150)

## 2011-06-28 LAB — BASIC METABOLIC PANEL
Calcium: 9.7 mg/dL (ref 8.4–10.5)
GFR calc Af Amer: >60 mL/min (ref >60)
GFR calc non Af Amer: >60 mL/min (ref >60)
Glucose, Bld: 106 mg/dL — ABNORMAL HIGH (ref 70–99)
Sodium: 142 mEq/L (ref 135–145)

## 2011-06-28 LAB — GLUCOSE, CAPILLARY
Glucose-Capillary: 111 mg/dL — ABNORMAL HIGH (ref 70–99)
Glucose-Capillary: 161 mg/dL — ABNORMAL HIGH (ref 70–99)

## 2011-06-28 MED ORDER — TECHNETIUM TC 99M TETROFOSMIN IV KIT
10.0000 | PACK | Freq: Once | INTRAVENOUS | Status: AC | PRN
  Administered 2011-06-28: 10 via INTRAVENOUS

## 2011-06-28 MED ORDER — TECHNETIUM TC 99M TETROFOSMIN IV KIT
30.0000 | PACK | Freq: Once | INTRAVENOUS | Status: AC | PRN
  Administered 2011-06-28: 30 via INTRAVENOUS

## 2011-07-01 NOTE — Discharge Summary (Signed)
[name], [name] NO.:  [id number]  MEDICAL RECORD NO.:  [id number]  LOCATION:  3733                         FACILITY:  [facility]  PHYSICIAN:  Madolyn Frieze. Jens Som, MD, FACCDATE OF BIRTH:  [birth year]  DATE OF ADMISSION:  06/26/2011 DATE OF DISCHARGE:  06/28/2011                              DISCHARGE SUMMARY   PRIMARY CARDIOLOGIST:  Pricilla Riffle, MD, [facility]  PRIMARY CARE PROVIDER:  Dr. Donnie Mesa.  DISCHARGE DIAGNOSIS:  Chest pain without objective evidence of ischemia.  SECONDARY DIAGNOSES: 1. Coronary disease status post prior right coronary artery and left     anterior descending coronary artery intervention in the past. 2. Hypertension. 3. Hyperlipidemia. 4. Gastroesophageal reflux disease. 5. Asthma. 6. Esophageal spasm. 7. Benign prostatic hypertrophy. 8. Irritable bowel syndrome.  ALLERGIES:  BUDESONIDE, DEMEROL, LYRICA, NAPROXEN, and SULFA.  PROCEDURES:  Lexiscan Myoview performed on June 28, 2011, showing no ischemia or scar, an EF of 48% and no wall motion abnormalities.  HISTORY OF PRESENT ILLNESS:  A 77 year old male with prior history of coronary artery disease status post prior RCA and more recently LAD/diagonal intervention in May 2012, and who was in his usual state of health until approximately 24 hours prior to admission and when he experienced an episode of substernal chest discomfort for which he took sublingual nitroglycerin with relief.  On day of admission, the patient had recurrent septal chest tightness associated with nausea and incomplete relief of symptoms following nitroglycerin prompting and presented to the ED.  In the ED, his ECG was nonacute and point-of-care markers were negative.  He was admitted for evaluation.  HOSPITAL COURSE:  The patient ruled out for MI.  He continued to complain of mild chest tightness which was constant in nature and persisted over the subsequent 48 hours.  He is scheduled for  [facility] which was finally performed this morning showing no evidence of ischemia or infarct.  We feel that this pain is noncardiac in origin. We have planned to discharge him home today in good condition.  Of note, the patient has noted recent changes to his stool noting softening and increased mucus and diarrhea.  He believes this is related to his statin therapy and because of this, we have held his statin.  He has noted improvement in his stool, then we will continue on the statin therapy for the time being.  DISCHARGE LABS:  Hemoglobin 13.2, hematocrit 38.7, WBC 7.2, and platelets 219.  Sodium 142, potassium 4.3, chloride 104, CO2 of 29, BUN 19, creatinine 0.96, glucose 106, CK 87, MB 3.6, troponin-I less than 0.30.  BNP 167.9, total cholesterol 162, triglycerides 82, HDL 46, LDL 100.  Urinalysis was normal.  DISPOSITION:  The patient will be discharged home today in good condition.  FOLLOWUP PLANS AND APPOINTMENTS:  The patient is asked to follow with his primary care provider in the next 1-2 weeks and we will arrange followup with Dr. Tenny Craw in approximately 4 weeks.  DISCHARGE MEDICATIONS: 1. Metoprolol 50 mg 1/2 tablet b.i.d. 2. Aspirin 81 mg daily. 3. Plavix 75 mg daily. 4. Doxazosin 4 mg at bedtime. 5. Furosemide 20 mg daily p.r.n. swelling. 6. Imdur 60 mg daily. 7.  Nexium 40 mg daily. 8. Nitroglycerin 0.4 mg sublingual p.r.n. chest pain.  OUTSTANDING LAB STUDIES:  None.  DURATION OF DISCHARGE ENCOUNTER:  Forty-five minutes including physician time.     Nicolasa Ducking, ANP   ______________________________ Madolyn Frieze. Jens Som, MD, [facility]    CB/MEDQ  D:  06/28/2011  T:  06/29/2011  Job:  [id number]  cc:   Dr. Donnie Mesa  Electronically Signed by Nicolasa Ducking ANP on 06/30/2011 03:27:25 PM Electronically Signed by Olga Millers MD [facility] on 07/01/2011 12:54:14 PM

## 2011-07-12 LAB — TSH: TSH: 2.8

## 2011-07-12 LAB — COMPREHENSIVE METABOLIC PANEL
ALT: 15
Alkaline Phosphatase: 96
CO2: 27
Glucose, Bld: 100 — ABNORMAL HIGH
Potassium: 4.2
Sodium: 139
Total Protein: 6

## 2011-07-12 LAB — CK TOTAL AND CKMB (NOT AT ARMC)
CK, MB: 3
Relative Index: 2.2

## 2011-07-12 LAB — DIFFERENTIAL
Lymphs Abs: 2
Monocytes Relative: 10
Neutro Abs: 4.2
Neutrophils Relative %: 56

## 2011-07-12 LAB — CBC
MCV: 84.4
RBC: 3.87 — ABNORMAL LOW
WBC: 7.6

## 2011-07-12 LAB — POCT CARDIAC MARKERS
CKMB, poc: 1.9
Myoglobin, poc: 110

## 2011-07-12 LAB — POCT I-STAT, CHEM 8
HCT: 34 — ABNORMAL LOW
Hemoglobin: 11.6 — ABNORMAL LOW
Potassium: 4.1
Sodium: 140

## 2011-07-12 LAB — MAGNESIUM: Magnesium: 2.1

## 2011-07-12 LAB — CARDIAC PANEL(CRET KIN+CKTOT+MB+TROPI): CK, MB: 2.3

## 2011-07-14 LAB — COMPREHENSIVE METABOLIC PANEL
Alkaline Phosphatase: 93
BUN: 9
CO2: 27
GFR calc non Af Amer: >60
Glucose, Bld: 133 — ABNORMAL HIGH
Potassium: 4.1
Total Protein: 5.8 — ABNORMAL LOW

## 2011-07-14 LAB — CBC
HCT: 31.5 — ABNORMAL LOW
Hemoglobin: 10.5 — ABNORMAL LOW
MCHC: 33.2
RBC: 3.77 — ABNORMAL LOW
RDW: 14.8

## 2011-07-14 LAB — URINALYSIS, ROUTINE W REFLEX MICROSCOPIC
Bilirubin Urine: NEGATIVE
Glucose, UA: NEGATIVE
Ketones, ur: NEGATIVE
pH: 5

## 2011-07-14 LAB — DIFFERENTIAL
Basophils Absolute: 0
Basophils Relative: 0
Eosinophils Absolute: 0.3
Monocytes Relative: 11
Neutro Abs: 8.2 — ABNORMAL HIGH
Neutrophils Relative %: 76

## 2011-07-14 LAB — URINE CULTURE

## 2011-07-22 LAB — COMPREHENSIVE METABOLIC PANEL
ALT: 19
AST: 24
Albumin: 1.9 — ABNORMAL LOW
Alkaline Phosphatase: 40
BUN: 10
Calcium: 7.6 — ABNORMAL LOW
Chloride: 110
Creatinine, Ser: 1.1
Creatinine, Ser: 1.13
GFR calc Af Amer: >60
GFR calc non Af Amer: >60
Glucose, Bld: 215 — ABNORMAL HIGH
Potassium: 3.9
Sodium: 137
Total Bilirubin: 1.2
Total Protein: 3.1 — ABNORMAL LOW
Total Protein: 3.2 — ABNORMAL LOW

## 2011-07-22 LAB — BLOOD GAS, ARTERIAL
Acid-base deficit: 1.8
MECHVT: 600
O2 Saturation: 99.5
PEEP: 5
Patient temperature: 101.8
RATE: 14
TCO2: 22.3
pCO2 arterial: 32.6 — ABNORMAL LOW

## 2011-07-22 LAB — BASIC METABOLIC PANEL
BUN: 8
CO2: 25
CO2: 27
Calcium: 7.6 — ABNORMAL LOW
Calcium: 7.8 — ABNORMAL LOW
Calcium: 9.1
Chloride: 107
Creatinine, Ser: 0.82
GFR calc Af Amer: >60
GFR calc Af Amer: >60
GFR calc Af Amer: >60
GFR calc non Af Amer: >60
GFR calc non Af Amer: >60
GFR calc non Af Amer: >60
GFR calc non Af Amer: >60
Glucose, Bld: 100 — ABNORMAL HIGH
Glucose, Bld: 102 — ABNORMAL HIGH
Glucose, Bld: 111 — ABNORMAL HIGH
Glucose, Bld: 97
Potassium: 3.6
Potassium: 3.6
Potassium: 4
Sodium: 134 — ABNORMAL LOW
Sodium: 137
Sodium: 139
Sodium: 140

## 2011-07-22 LAB — POCT I-STAT 4, (NA,K, GLUC, HGB,HCT)
Glucose, Bld: 119 — ABNORMAL HIGH
HCT: 27 — ABNORMAL LOW
HCT: 30 — ABNORMAL LOW
Hemoglobin: 9.2 — ABNORMAL LOW
Operator id: 151301
Operator id: 151301
Potassium: 4.2
Sodium: 140

## 2011-07-22 LAB — CULTURE, BLOOD (ROUTINE X 2)

## 2011-07-22 LAB — TYPE AND SCREEN
ABO/RH(D): A POS
Antibody Screen: NEGATIVE

## 2011-07-22 LAB — CROSSMATCH: Antibody Screen: NEGATIVE

## 2011-07-22 LAB — PREPARE FRESH FROZEN PLASMA

## 2011-07-22 LAB — URINE MICROSCOPIC-ADD ON

## 2011-07-22 LAB — CARDIAC PANEL(CRET KIN+CKTOT+MB+TROPI)
CK, MB: 11.7 — ABNORMAL HIGH
CK, MB: 11.9 — ABNORMAL HIGH
CK, MB: 13.2 — ABNORMAL HIGH
Total CK: 1021 — ABNORMAL HIGH
Total CK: 570 — ABNORMAL HIGH
Total CK: 968 — ABNORMAL HIGH
Troponin I: 0.05
Troponin I: 0.1 — ABNORMAL HIGH
Troponin I: 0.15 — ABNORMAL HIGH
Troponin I: 0.19 — ABNORMAL HIGH

## 2011-07-22 LAB — PROTIME-INR
INR: 1.3
INR: 1.5
Prothrombin Time: 16.3 — ABNORMAL HIGH
Prothrombin Time: 16.7 — ABNORMAL HIGH
Prothrombin Time: 18 — ABNORMAL HIGH

## 2011-07-22 LAB — CBC
HCT: 22.6 — ABNORMAL LOW
HCT: 23.5 — ABNORMAL LOW
HCT: 26.4 — ABNORMAL LOW
HCT: 31.4 — ABNORMAL LOW
Hemoglobin: 11.2 — ABNORMAL LOW
Hemoglobin: 7.4 — CL
Hemoglobin: 9.3 — ABNORMAL LOW
MCHC: 34.5
MCHC: 34.6
MCHC: 35.3
MCV: 88.2
MCV: 90.8
Platelets: 103 — ABNORMAL LOW
Platelets: 105 — ABNORMAL LOW
Platelets: 110 — ABNORMAL LOW
Platelets: 114 — ABNORMAL LOW
Platelets: 114 — ABNORMAL LOW
Platelets: 163
RBC: 2.62 — ABNORMAL LOW
RBC: 2.95 — ABNORMAL LOW
RBC: 3.66 — ABNORMAL LOW
RDW: 13.4
RDW: 13.7
RDW: 13.9
RDW: 14.2
RDW: 14.2
RDW: 14.3
WBC: 12.3 — ABNORMAL HIGH
WBC: 12.5 — ABNORMAL HIGH
WBC: 7.2
WBC: 9.7

## 2011-07-22 LAB — POCT I-STAT 7, (LYTES, BLD GAS, ICA,H+H)
Calcium, Ion: 1.29
Hemoglobin: 9.5 — ABNORMAL LOW
Patient temperature: 36.4
Potassium: 3.1 — ABNORMAL LOW
pCO2 arterial: 59.6
pH, Arterial: 7.257 — ABNORMAL LOW
pO2, Arterial: 415 — ABNORMAL HIGH

## 2011-07-22 LAB — URINALYSIS, MICROSCOPIC ONLY
Bilirubin Urine: NEGATIVE
Glucose, UA: NEGATIVE
Ketones, ur: NEGATIVE
Protein, ur: NEGATIVE
Urobilinogen, UA: 0.2

## 2011-07-22 LAB — URINE CULTURE: Colony Count: NO GROWTH

## 2011-07-22 LAB — BLEEDING TIME: Bleeding Time: 2.5

## 2011-07-22 LAB — TRANSFUSION REACTION
DAT C3: NEGATIVE
Post RXN DAT IgG: NEGATIVE

## 2011-07-22 LAB — PHOSPHORUS: Phosphorus: 3.1

## 2011-07-22 LAB — URINALYSIS, ROUTINE W REFLEX MICROSCOPIC
Bilirubin Urine: NEGATIVE
Glucose, UA: NEGATIVE
Ketones, ur: 15 — AB
Leukocytes, UA: NEGATIVE
Specific Gravity, Urine: 1.026
pH: 5

## 2011-07-22 LAB — MAGNESIUM: Magnesium: 1.7

## 2011-07-22 LAB — APTT: aPTT: 31

## 2011-07-22 LAB — PREPARE PLATELET PHERESIS

## 2011-07-25 ENCOUNTER — Other Ambulatory Visit: Payer: Medicare Other | Admitting: *Deleted

## 2011-07-28 ENCOUNTER — Ambulatory Visit (INDEPENDENT_AMBULATORY_CARE_PROVIDER_SITE_OTHER): Payer: Medicare Other | Admitting: Internal Medicine

## 2011-07-28 ENCOUNTER — Encounter: Payer: Self-pay | Admitting: Internal Medicine

## 2011-07-28 VITALS — BP 140/78 | HR 76 | Ht 70.0 in | Wt 173.0 lb

## 2011-07-28 DIAGNOSIS — I2510 Atherosclerotic heart disease of native coronary artery without angina pectoris: Secondary | ICD-10-CM

## 2011-07-28 DIAGNOSIS — I10 Essential (primary) hypertension: Secondary | ICD-10-CM

## 2011-07-28 DIAGNOSIS — E785 Hyperlipidemia, unspecified: Secondary | ICD-10-CM

## 2011-07-28 MED ORDER — FUROSEMIDE 20 MG PO TABS: 20.0000 mg | ORAL_TABLET | Freq: Every day | ORAL | Status: DC

## 2011-07-28 MED ORDER — NITROGLYCERIN 0.4 MG SL SUBL: 0.4000 mg | SUBLINGUAL_TABLET | SUBLINGUAL | Status: DC | PRN

## 2011-07-28 MED ORDER — ISOSORBIDE MONONITRATE ER 60 MG PO TB24: 60.0000 mg | ORAL_TABLET | Freq: Every day | ORAL | Status: DC

## 2011-07-28 MED ORDER — CLOPIDOGREL BISULFATE 75 MG PO TABS: 75.0000 mg | ORAL_TABLET | Freq: Every day | ORAL | Status: DC

## 2011-07-28 MED ORDER — DOXAZOSIN MESYLATE 4 MG PO TABS: 4.0000 mg | ORAL_TABLET | Freq: Every day | ORAL | Status: DC

## 2011-07-28 MED ORDER — METOPROLOL TARTRATE 50 MG PO TABS: 50.0000 mg | ORAL_TABLET | Freq: Two times a day (BID) | ORAL | Status: DC

## 2011-07-28 NOTE — Patient Instructions (Signed)
Your physician wants you to follow-up in:FEB/MARCH 2013 You will receive a reminder letter in the mail two months in advance. If you don't receive a letter, please call our office to schedule the follow-up appointment.

## 2011-07-28 NOTE — Assessment & Plan Note (Signed)
I am not convinced the patient's intermittent episodes of pain are angina.  He does have IBS symptoms.  He did say that he burps a lot.  I would recomm continuing current regimen.  I would add  GI meds (acidophilus (colon health) and flax seed for fiber).   He has difficult lesions.  Intervention should only be tried if there is significant ischemia on myoview.

## 2011-07-28 NOTE — Progress Notes (Signed)
HPIAlvin NESTOR [name] is a 77 y.o. male who presents for post hospital follow up. He has a h/o CAD, s/p stent to the RCA in the past as well as cutting balloon angioplasty to the Dx. He has chronic chest pain and was recently admitted for recurrent symptoms 6/20-6/22. MI was r/o and cardiac cath demonstrated oDx 80%, mLAD 50% with 30-40% at the level of the Dx, CFX 30%, pRCA 25% with patent stent. He had cutting balloon PTCA done again to the Dx. Dr. Excell Seltzer did his intervention and he felt that the patient should be continued on dual antiplatelet therapy and aggressive medical management. He noted that the patient has had chronic chest pain and many catheterization procedures. If he has recurrent chest pain, he should have a nuclear stress test. Dr. Excell Seltzer did not think there was much more that could be safely done with his diagonal ostium without jeopardizing the LAD. He did feel that significant ischemia should be proven before considering repeat PCI.  The patient was admitted just a few wks ago with chest pain.  He ruled out for MI.  Myoview scan showed no ischemia  Since discharge he has had intermittent chest pain (about 1x per week)..  Not always associated with activity.  He did say that in June prior to his PTCA his chest pain was helped by taking NTG With his current spells NTG has not helped.  Patient also notes that he does not think that generic lipitor was the cause of his increase gas production.  He wonders if it is the generic Plavix.  He has a history of IBS He does note that his bowel movements are more mucusy.  No blood in stool.   Allergies  Allergen Reactions  . Meperidine Hcl   . Simvastatin   . Sulfonamide Derivatives   . Symbicort     REACTION: Mouth and tongue swelling    Current Outpatient Prescriptions  Medication Sig Dispense Refill  . albuterol (PROVENTIL, VENTOLIN) (5 MG/ML) 0.5% NEBU Take by nebulization as needed.        Marland Kitchen aspirin 81 MG tablet Take 81 mg by mouth  daily.        . clopidogrel (PLAVIX) 75 MG tablet Take 1 tablet (75 mg total) by mouth daily.  30 tablet  6  . doxazosin (CARDURA) 4 MG tablet Take 1 tablet (4 mg total) by mouth at bedtime.  30 tablet  6  . esomeprazole (NEXIUM) 40 MG capsule Take 40 mg by mouth 2 (two) times daily.        . fluticasone (FLOVENT HFA) 220 MCG/ACT inhaler Inhale 2 puffs into the lungs 2 (two) times daily.        . furosemide (LASIX) 20 MG tablet Take 1 tablet (20 mg total) by mouth daily.  30 tablet  6  . gabapentin (NEURONTIN) 100 MG capsule Take 100 mg by mouth as needed.        . isosorbide mononitrate (IMDUR) 60 MG 24 hr tablet Take 1 tablet (60 mg total) by mouth daily.  30 tablet  6  . metoprolol (LOPRESSOR) 50 MG tablet Take 1 tablet (50 mg total) by mouth 2 (two) times daily. Take 1 1/2 tablets twice a day  100 tablet  6  . nitroGLYCERIN (NITROSTAT) 0.4 MG SL tablet Place 1 tablet (0.4 mg total) under the tongue every 5 (five) minutes as needed.  25 tablet  6    Past Medical History  Diagnosis Date  . Hypertension   .  Hyperlipidemia   . Chronic chest pain   . Dyspnea     chronic  . GERD (gastroesophageal reflux disease)     h/o esophageal spasm  . Asthma   . Back pain   . Coronary artery disease     a. s/p BMS to RCA in 2002; b. s/p cutting balloon POBA ;   c. cath 6/12: oDx 80% (treated with repeat cutting balloon POBA), mLAD 50% with 30-40% at Dx, CFX 30%, pRCA 25% with patent stents    Past Surgical History  Procedure Date  . Cardiac catheterization   . Neck surgery     multiple  . Back surgery     multiple  . Laminectomy   . Posterior laminectomy / decompression cervical spine   . Anterior cervical decomp/discectomy fusion   . Coronary angioplasty with stent placement      previous percutaneous intervention on the  RCA and the diagonal branch    Family History  Problem Relation Age of Onset  . Diabetes Father   . Heart disease Father   . Asthma Father   . Heart disease Mother      CABG hx age 62  . Colon cancer Son     hx   . Colitis Son     hx  . Crohn's disease Son     History   Social History  . Marital Status: Widowed    Spouse Name: N/A    Number of Children: N/A  . Years of Education: N/A   Occupational History  . retired     Forensic scientist   Social History Main Topics  . Smoking status: Never Smoker   . Smokeless tobacco: Not on file  . Alcohol Use: No  . Drug Use: No  . Sexually Active: Not on file   Other Topics Concern  . Not on file   Social History Narrative  . No narrative on file    Review of Systems:  All systems reviewed.  They are negative to the above problem except as previously stated.  Vital Signs: BP 140/78  Pulse 76  Ht 5\' 10"  (1.778 m)  Wt 173 lb (78.472 kg)  BMI 24.82 kg/m2  Physical Exam  Patient is in NAD  HEENT:  Normocephalic, atraumatic. EOMI, PERRLA.  Neck: JVP is normal. No thyromegaly. No bruits.  Lungs: clear to auscultation. No rales no wheezes.  Heart: Regular rate and rhythm. Normal S1, S2. No S3.   No significant murmurs. PMI not displaced.  Abdomen:  Supple, nontender. Normal bowel sounds. No masses. No hepatomegaly.  Extremities:   Good distal pulses throughout. No lower extremity edema.  Musculoskeletal :moving all extremities.  Neuro:   alert and oriented x3.  CN II-XII grossly intact.   Assessment and Plan:

## 2011-07-28 NOTE — Assessment & Plan Note (Signed)
Resume lipitor. 

## 2011-07-28 NOTE — Assessment & Plan Note (Signed)
Keep on current regimen. 

## 2011-08-01 LAB — CBC
MCHC: 33.8
MCV: 91.9
Platelets: 208
RBC: 3.79 — ABNORMAL LOW
RBC: 3.84 — ABNORMAL LOW
RBC: 4.18 — ABNORMAL LOW
RDW: 14.1 — ABNORMAL HIGH
WBC: 6.5
WBC: 7.1

## 2011-08-01 LAB — POCT I-STAT 3, VENOUS BLOOD GAS (G3P V)
Bicarbonate: 26.6 — ABNORMAL HIGH
Operator id: 256671
pCO2, Ven: 51.1 — ABNORMAL HIGH
pH, Ven: 7.325 — ABNORMAL HIGH
pO2, Ven: 37

## 2011-08-01 LAB — POCT I-STAT 3, ART BLOOD GAS (G3+)
Bicarbonate: 26.4 — ABNORMAL HIGH
O2 Saturation: 97
TCO2: 28
pCO2 arterial: 43.7
pH, Arterial: 7.388
pO2, Arterial: 88

## 2011-08-01 LAB — BASIC METABOLIC PANEL
BUN: 11
CO2: 31
Calcium: 9.1
Creatinine, Ser: 0.93
GFR calc Af Amer: >60

## 2011-08-01 LAB — TSH: TSH: 1.267

## 2011-08-01 LAB — COMPREHENSIVE METABOLIC PANEL
ALT: 23
AST: 26
CO2: 30
Chloride: 107
GFR calc Af Amer: >60
GFR calc non Af Amer: >60
Sodium: 140
Total Bilirubin: 0.9

## 2011-08-01 LAB — LIPID PANEL
HDL: 45
Triglycerides: 118

## 2011-08-01 LAB — CARDIAC PANEL(CRET KIN+CKTOT+MB+TROPI)
CK, MB: 3.3
Total CK: 157

## 2011-08-05 ENCOUNTER — Telehealth: Payer: Self-pay | Admitting: Internal Medicine

## 2011-08-05 ENCOUNTER — Encounter: Payer: Self-pay | Admitting: Gastroenterology

## 2011-08-05 ENCOUNTER — Telehealth: Payer: Self-pay | Admitting: Gastroenterology

## 2011-08-05 NOTE — Telephone Encounter (Signed)
I WOULD BE VERY GLAD IF ANYONE WOULD TAKE THIS PT.i DOUBT THEY WOLL

## 2011-08-05 NOTE — Telephone Encounter (Signed)
Spoke with pt who stated he just isn't satisfied with Dr Jarold Motto. Pt stated he had procedures last year and never heard about the " polyp results". Pt had ECL on 09/20/10 showing negative H. Pylori and severe diverticulosis but no cause for bleeding and no polyps were removed according to the report. Explained to pt we try to have safeguards so that reports aren't missed, but we have no excuse and I apologized to pt. Dr Jarold Motto, he's not satisfied per pt.

## 2011-08-05 NOTE — Telephone Encounter (Signed)
Called patient back. He started back on Plavix name brand and still has symptoms of IBS. Discussed with Dr.Ross and she advised that he needs to see GI again. Called GI department and asked that patient be set up with Dr.Stark per patient request. They will call him back. Patient is aware of this.

## 2011-08-05 NOTE — Telephone Encounter (Signed)
Pt is calling about taking Plavix.  Pt states he is doing ok but would like to speak to you regarding same.

## 2011-08-05 NOTE — Telephone Encounter (Signed)
Dr Jarold Motto, OK for pt to transfer to Dr Russella Dar? Thanks.

## 2011-08-05 NOTE — Telephone Encounter (Signed)
Need a reason..very comp;icated pt...Marland Kitchendrp

## 2011-08-08 NOTE — Telephone Encounter (Signed)
Dr Russella Dar, will you accept this pt? Thanks.

## 2011-08-31 ENCOUNTER — Telehealth: Payer: Self-pay | Admitting: *Deleted

## 2011-08-31 NOTE — Telephone Encounter (Signed)
I think Dr Russella Dar replied in an office note that our Practice will not see this pt d/t past issues and Dr Dietrich Pates will find another GI practice for pt.

## 2011-09-12 ENCOUNTER — Telehealth: Payer: Self-pay | Admitting: *Deleted

## 2011-09-12 NOTE — Telephone Encounter (Signed)
Called patient with appointment for referral for Dr.Edwards [facility]) for IBS per Dr.Ross Scheduled for 11/29 at 1245 pm at [address]. All records faxed to [phone].

## 2011-09-15 ENCOUNTER — Other Ambulatory Visit (HOSPITAL_COMMUNITY): Payer: Self-pay | Admitting: Gastroenterology

## 2011-09-15 DIAGNOSIS — R1011 Right upper quadrant pain: Secondary | ICD-10-CM

## 2011-09-22 ENCOUNTER — Encounter (HOSPITAL_COMMUNITY)
Admission: RE | Admit: 2011-09-22 | Discharge: 2011-09-22 | Disposition: A | Discharge status: Home / Self Care | Payer: Medicare Other | Source: Ambulatory Visit | Attending: Gastroenterology | Admitting: Gastroenterology

## 2011-09-22 DIAGNOSIS — R1011 Right upper quadrant pain: Secondary | ICD-10-CM

## 2011-09-22 MED ORDER — SINCALIDE 5 MCG IJ SOLR
INTRAMUSCULAR | Status: AC
  Administered 2011-09-22: 1.58 ug via INTRAVENOUS
  Filled 2011-09-22: qty 5

## 2011-09-22 MED ORDER — TECHNETIUM TC 99M MEBROFENIN IV KIT
5.0000 | PACK | Freq: Once | INTRAVENOUS | Status: AC | PRN
  Administered 2011-09-22: 5 via INTRAVENOUS

## 2011-11-09 ENCOUNTER — Other Ambulatory Visit: Payer: Self-pay | Admitting: Gastroenterology

## 2011-11-09 ENCOUNTER — Encounter: Payer: Self-pay | Admitting: Internal Medicine

## 2012-01-05 ENCOUNTER — Other Ambulatory Visit: Payer: Self-pay | Admitting: Gastroenterology

## 2012-01-05 DIAGNOSIS — R197 Diarrhea, unspecified: Secondary | ICD-10-CM

## 2012-01-09 ENCOUNTER — Ambulatory Visit
Admission: RE | Admit: 2012-01-09 | Discharge: 2012-01-09 | Disposition: A | Discharge status: Home / Self Care | Payer: Medicare Other | Source: Ambulatory Visit | Attending: Gastroenterology | Admitting: Gastroenterology

## 2012-01-09 DIAGNOSIS — R197 Diarrhea, unspecified: Secondary | ICD-10-CM

## 2012-01-09 MED ORDER — IOHEXOL 300 MG/ML  SOLN
100.0000 mL | Freq: Once | INTRAMUSCULAR | Status: AC | PRN
  Administered 2012-01-09: 100 mL via INTRAVENOUS

## 2012-02-02 ENCOUNTER — Encounter: Payer: Self-pay | Admitting: Internal Medicine

## 2012-02-02 ENCOUNTER — Ambulatory Visit (INDEPENDENT_AMBULATORY_CARE_PROVIDER_SITE_OTHER): Payer: Medicare Other | Admitting: Internal Medicine

## 2012-02-02 VITALS — BP 152/81 | HR 64 | Ht 71.0 in | Wt 172.0 lb

## 2012-02-02 DIAGNOSIS — E785 Hyperlipidemia, unspecified: Secondary | ICD-10-CM

## 2012-02-02 NOTE — Patient Instructions (Signed)
Hold LIPITOR for 3 weeks and call us to let us know how you are feeling. [phone].  Your physician wants you to follow-up in: September 2013 You will receive a reminder letter in the mail two months in advance. If you don't receive a letter, please call our office to schedule the follow-up appointment.

## 2012-02-02 NOTE — Progress Notes (Signed)
HPIHPIAlvin HILLMAN [name] is a 78 y.o. male who presents for post hospital follow up. He has a h/o CAD, s/p stent to the RCA in the past as well as cutting balloon angioplasty to the Dx. He has chronic chest pain and was recently admitted for recurrent symptoms 6/20-6/22. MI was r/o and cardiac cath demonstrated oDx 80%, mLAD 50% with 30-40% at the level of the Dx, CFX 30%, pRCA 25% with patent stent. He had cutting balloon PTCA done again to the Dx. Dr. Excell Seltzer did his intervention and he felt that the patient should be continued on dual antiplatelet therapy and aggressive medical management. He noted that the patient has had chronic chest pain and many catheterization procedures. If he has recurrent chest pain, he should have a nuclear stress test. Dr. Excell Seltzer did not think there was much more that could be safely done with his diagonal ostium without jeopardizing the LAD. He did feel that significant ischemia should be proven before considering repeat PCI.  The patient was admitted just a few wks ago with chest pain. He ruled out for MI. Myoview scan showed no ischemia Seen in GI by Dr. Randa Evens.  Still have GI irregularities Stll gets a pocket of pressure in chest.  Occurs with an without activty.  Takes NTG and eases.  QUestions if related to esophagus. Breathing is good. Questions if lipitor or plavix are leading to gi complaints.  Allergies  Allergen Reactions  . Iohexol Shortness Of Breath and Itching    Pt was given 100cc of Omnipaque 300 followed by itching/ dyspnea.  . Iodinated Diagnostic Agents     Iodine pt had a reaction when he had a CT DONE  . Meperidine Hcl   . Simvastatin   . Sulfonamide Derivatives   . Symbicort     REACTION: Mouth and tongue swelling    Current Outpatient Prescriptions  Medication Sig Dispense Refill  . albuterol (PROVENTIL, VENTOLIN) (5 MG/ML) 0.5% NEBU Take by nebulization as needed.        Marland Kitchen aspirin 81 MG tablet Take 81 mg by mouth daily.        . clopidogrel  (PLAVIX) 75 MG tablet Take 1 tablet (75 mg total) by mouth daily.  30 tablet  6  . doxazosin (CARDURA) 4 MG tablet Take 1 tablet (4 mg total) by mouth at bedtime.  30 tablet  6  . esomeprazole (NEXIUM) 40 MG capsule Take 40 mg by mouth 2 (two) times daily.        . fluticasone (FLOVENT HFA) 220 MCG/ACT inhaler Inhale 2 puffs into the lungs 2 (two) times daily.        . furosemide (LASIX) 20 MG tablet Take 1 tablet (20 mg total) by mouth daily.  30 tablet  6  . gabapentin (NEURONTIN) 100 MG capsule Take 100 mg by mouth as needed.        . isosorbide mononitrate (IMDUR) 60 MG 24 hr tablet Take 1 tablet (60 mg total) by mouth daily.  30 tablet  6  . metoprolol (LOPRESSOR) 50 MG tablet Take 1 tablet (50 mg total) by mouth 2 (two) times daily. Take 1 1/2 tablets twice a day  100 tablet  6  . nitroGLYCERIN (NITROSTAT) 0.4 MG SL tablet Place 1 tablet (0.4 mg total) under the tongue every 5 (five) minutes as needed.  25 tablet  6    Past Medical History  Diagnosis Date  . Hypertension   . Hyperlipidemia   . Chronic chest pain   .  Dyspnea     chronic  . GERD (gastroesophageal reflux disease)     h/o esophageal spasm  . Asthma   . Back pain   . Coronary artery disease     a. s/p BMS to RCA in 2002; b. s/p cutting balloon POBA ;   c. cath 6/12: oDx 80% (treated with repeat cutting balloon POBA), mLAD 50% with 30-40% at Dx, CFX 30%, pRCA 25% with patent stents    Past Surgical History  Procedure Date  . Cardiac catheterization   . Neck surgery     multiple  . Back surgery     multiple  . Laminectomy   . Posterior laminectomy / decompression cervical spine   . Anterior cervical decomp/discectomy fusion   . Coronary angioplasty with stent placement      previous percutaneous intervention on the  RCA and the diagonal branch    Family History  Problem Relation Age of Onset  . Diabetes Father   . Heart disease Father   . Asthma Father   . Heart disease Mother     CABG hx age 79  . Colon  cancer Son     hx   . Colitis Son     hx  . Crohn's disease Son     History   Social History  . Marital Status: Widowed    Spouse Name: N/A    Number of Children: N/A  . Years of Education: N/A   Occupational History  . retired     Forensic scientist   Social History Main Topics  . Smoking status: Never Smoker   . Smokeless tobacco: Not on file  . Alcohol Use: No  . Drug Use: No  . Sexually Active: Not on file   Other Topics Concern  . Not on file   Social History Narrative  . No narrative on file    Review of Systems:  All systems reviewed.  They are negative to the above problem except as previously stated.  Vital Signs: BP 152/81  Pulse 64  Ht 5\' 11"  (1.803 m)  Wt 172 lb (78.019 kg)  BMI 23.99 kg/m2  Physical Exam Patient in NAD at rest. HEENT:  Normocephalic, atraumatic. EOMI, PERRLA.  Neck: JVP is normal. No thyromegaly. No bruits.  Lungs: clear to auscultation. No rales no wheezes.  Heart: Regular rate and rhythm. Normal S1, S2. No S3.   No significant murmurs. PMI not displaced.  Abdomen: No hepatomegaly.  INcreased BS.  Mild Rsided tenderness  Extremities:   Good distal pulses throughout. No lower extremity edema.  Musculoskeletal :moving all extremities.  Neuro:   alert and oriented x3.  CN II-XII grossly intact.   Assessment and Plan:  1.  CAD  I am not convinced symptoms represent active angina.  QUestion GI  I would recomm continuing current regimen.  Keep on plavix. 2.  Dyslipidemia  On lipitor 40  Willl hold transiently to see if helps GI symptoms  HAve writtent that he call back.  With his degree of CAD I am reluctant to try to stop 3.  GI  F/U next with with Dr. Randa Evens. 4.  Memory issues.  QUesition if due to mult other issues with stress  Ques med induced  Question dementia 5.  HTN  Follow  Usually better.

## 2012-02-23 ENCOUNTER — Telehealth: Payer: Self-pay | Admitting: Internal Medicine

## 2012-02-23 NOTE — Telephone Encounter (Signed)
Pt states he has no difference with GI/CP issues on or off Lipitor, pt to resume Lipitor 40 mg, please advise if you need to see him sooner than 4 mo ov or labs.

## 2012-02-23 NOTE — Telephone Encounter (Signed)
New msg Pt said he stopped taking lipitor 3 wks ago. He was told to call back. Please call

## 2012-02-26 NOTE — Telephone Encounter (Signed)
I can see him in 4 months.

## 2012-02-27 NOTE — Telephone Encounter (Signed)
Patient has an to be seen by Dr.Ross in September.

## 2012-03-23 ENCOUNTER — Encounter (HOSPITAL_COMMUNITY)
Admission: RE | Disposition: A | Discharge status: Home / Self Care | Payer: Self-pay | Source: Ambulatory Visit | Attending: Gastroenterology

## 2012-03-23 ENCOUNTER — Encounter (HOSPITAL_COMMUNITY): Payer: Self-pay

## 2012-03-23 ENCOUNTER — Ambulatory Visit (HOSPITAL_COMMUNITY): Payer: Medicare Other

## 2012-03-23 ENCOUNTER — Ambulatory Visit (HOSPITAL_COMMUNITY)
Admission: RE | Admit: 2012-03-23 | Discharge: 2012-03-23 | Disposition: A | Discharge status: Home / Self Care | Payer: Medicare Other | Source: Ambulatory Visit | Attending: Gastroenterology | Admitting: Gastroenterology

## 2012-03-23 DIAGNOSIS — Z79899 Other long term (current) drug therapy: Secondary | ICD-10-CM | POA: Insufficient documentation

## 2012-03-23 DIAGNOSIS — J45909 Unspecified asthma, uncomplicated: Secondary | ICD-10-CM | POA: Insufficient documentation

## 2012-03-23 DIAGNOSIS — K222 Esophageal obstruction: Secondary | ICD-10-CM | POA: Insufficient documentation

## 2012-03-23 DIAGNOSIS — I10 Essential (primary) hypertension: Secondary | ICD-10-CM | POA: Insufficient documentation

## 2012-03-23 DIAGNOSIS — K219 Gastro-esophageal reflux disease without esophagitis: Secondary | ICD-10-CM | POA: Insufficient documentation

## 2012-03-23 DIAGNOSIS — I2510 Atherosclerotic heart disease of native coronary artery without angina pectoris: Secondary | ICD-10-CM | POA: Insufficient documentation

## 2012-03-23 DIAGNOSIS — E785 Hyperlipidemia, unspecified: Secondary | ICD-10-CM | POA: Insufficient documentation

## 2012-03-23 DIAGNOSIS — I252 Old myocardial infarction: Secondary | ICD-10-CM | POA: Insufficient documentation

## 2012-03-23 HISTORY — PX: ESOPHAGOGASTRODUODENOSCOPY: SHX5428

## 2012-03-23 HISTORY — PX: SAVORY DILATION: SHX5439

## 2012-03-23 SURGERY — EGD (ESOPHAGOGASTRODUODENOSCOPY)
Anesthesia: Moderate Sedation

## 2012-03-23 MED ORDER — MIDAZOLAM HCL 10 MG/2ML IJ SOLN
INTRAMUSCULAR | Status: AC
  Filled 2012-03-23: qty 4

## 2012-03-23 MED ORDER — DIPHENHYDRAMINE HCL 50 MG/ML IJ SOLN
INTRAMUSCULAR | Status: AC
  Filled 2012-03-23: qty 1

## 2012-03-23 MED ORDER — FENTANYL CITRATE 0.05 MG/ML IJ SOLN
INTRAMUSCULAR | Status: DC | PRN
  Administered 2012-03-23 (×3): 25 ug via INTRAVENOUS

## 2012-03-23 MED ORDER — BUTAMBEN-TETRACAINE-BENZOCAINE 2-2-14 % EX AERO
INHALATION_SPRAY | CUTANEOUS | Status: DC | PRN
  Administered 2012-03-23: 2 via TOPICAL

## 2012-03-23 MED ORDER — FENTANYL CITRATE 0.05 MG/ML IJ SOLN
INTRAMUSCULAR | Status: AC
  Filled 2012-03-23: qty 4

## 2012-03-23 MED ORDER — MIDAZOLAM HCL 10 MG/2ML IJ SOLN
INTRAMUSCULAR | Status: DC | PRN
  Administered 2012-03-23 (×3): 2 mg via INTRAVENOUS

## 2012-03-23 NOTE — Discharge Instructions (Addendum)
Continue current meds. Clear liquids for 4-6 hours, if no chest pain or trouble breathing, soft foods tonight.  Regular diet tomorrow. Call for problems.  Endoscopy Care After Please read the instructions outlined below and refer to this sheet in the next few weeks. These discharge instructions provide you with general information on caring for yourself after you leave the hospital. Your doctor may also give you specific instructions. While your treatment has been planned according to the most current medical practices available, unavoidable complications occasionally occur. If you have any problems or questions after discharge, please call your doctor. HOME CARE INSTRUCTIONS Activity  You may resume your regular activity but move at a slower pace for the next 24 hours.   Take frequent rest periods for the next 24 hours.   Walking will help expel (get rid of) the air and reduce the bloated feeling in your abdomen.   No driving for 24 hours (because of the anesthesia (medicine) used during the test).   You may shower.   Do not sign any important legal documents or operate any machinery for 24 hours (because of the anesthesia used during the test).  Nutrition  Drink plenty of fluids.   You may resume your normal diet.   Begin with a light meal and progress to your normal diet.   Avoid alcoholic beverages for 24 hours or as instructed by your caregiver.  Medications You may resume your normal medications unless your caregiver tells you otherwise. What you can expect today  You may experience abdominal discomfort such as a feeling of fullness or "gas" pains.   You may experience a sore throat for 2 to 3 days. This is normal. Gargling with salt water may help this.  Follow-up Your doctor will discuss the results of your test with you. SEEK IMMEDIATE MEDICAL CARE IF:  You have excessive nausea (feeling sick to your stomach) and/or vomiting.   You have severe abdominal pain and  distention (swelling).   You have trouble swallowing.   You have a temperature over 100 F (37.8 C).   You have rectal bleeding or vomiting of blood.  Document Released: 05/17/2004 Document Revised: 09/22/2011 Document Reviewed: 11/28/2007 [employer] Patient Information 2012 [employer]. 

## 2012-03-23 NOTE — H&P (Signed)
Subjective:   Patient is a 78 y.o. male presents with patient has history of esophageal dilatation proximally 2 years ago to [address] [place]. His has a known stricture of the esophagus and is having some increasing dysphagia comes in for outpatient dilatation.. Procedure including risks and benefits discussed in office.  Patient Active Problem List  Diagnoses Date Noted  . ESOPHAGEAL STRICTURE 09/20/2010  . ORTHOSTATIC DIZZINESS 08/30/2010  . IBS 08/26/2010  . RECTAL BLEEDING 08/26/2010  . RECTAL PAIN 08/26/2010  . ARTHRITIS 08/26/2010  . LOSS OF APPETITE 08/26/2010  . NAUSEA AND VOMITING 08/26/2010  . OTHER DYSPHAGIA 08/26/2010  . ABDOMINAL PAIN, GENERALIZED 08/26/2010  . COLONIC POLYPS, HX OF 08/26/2010  . ANAL FISSURE, HX OF 08/26/2010  . PALPITATIONS, HX OF 08/18/2010  . PERIPHERAL NEUROPATHY 05/01/2009  . MYOCARDIAL INFARCTION 05/01/2009  . ALLERGIC RHINITIS, SEASONAL 05/01/2009  . DEPRESSION, HX OF 05/01/2009  . Personal history of other diseases of digestive disease 05/01/2009  . NEPHROLITHIASIS, HX OF 05/01/2009  . BENIGN PROSTATIC HYPERTROPHY, HX OF 05/01/2009  . LAMINECTOMY, HX OF 05/01/2009  . HYPERLIPIDEMIA-MIXED 10/20/2008  . ESSENTIAL HYPERTENSION 10/20/2008  . CAD, NATIVE VESSEL 10/20/2008  . ASTHMA, UNSPECIFIED, UNSPECIFIED STATUS 10/20/2008  . GERD 10/20/2008  . DYSPNEA 10/20/2008   Past Medical History  Diagnosis Date  . Hypertension   . Hyperlipidemia   . Chronic chest pain   . Dyspnea     chronic  . GERD (gastroesophageal reflux disease)     h/o esophageal spasm  . Asthma   . Back pain   . Coronary artery disease     a. s/p BMS to RCA in 2002; b. s/p cutting balloon POBA ;   c. cath 6/12: oDx 80% (treated with repeat cutting balloon POBA), mLAD 50% with 30-40% at Dx, CFX 30%, pRCA 25% with patent stents    Past Surgical History  Procedure Date  . Cardiac catheterization   . Neck surgery     multiple  . Back surgery     multiple  . Laminectomy     . Posterior laminectomy / decompression cervical spine   . Anterior cervical decomp/discectomy fusion   . Coronary angioplasty with stent placement      previous percutaneous intervention on the  RCA and the diagonal branch    Prescriptions prior to admission  Medication Sig Dispense Refill  . albuterol (PROVENTIL, VENTOLIN) (5 MG/ML) 0.5% NEBU Take by nebulization as needed.        Marland Kitchen aspirin 81 MG tablet Take 81 mg by mouth daily.        Marland Kitchen atorvastatin (LIPITOR) 40 MG tablet Take 40 mg by mouth.      . clopidogrel (PLAVIX) 75 MG tablet Take 1 tablet (75 mg total) by mouth daily.  30 tablet  6  . doxazosin (CARDURA) 4 MG tablet Take 1 tablet (4 mg total) by mouth at bedtime.  30 tablet  6  . esomeprazole (NEXIUM) 40 MG capsule Take 40 mg by mouth 2 (two) times daily.        . fluticasone (FLOVENT HFA) 220 MCG/ACT inhaler Inhale 2 puffs into the lungs 2 (two) times daily.        . furosemide (LASIX) 20 MG tablet Take 1 tablet (20 mg total) by mouth daily.  30 tablet  6  . gabapentin (NEURONTIN) 100 MG capsule Take 100 mg by mouth as needed.        . isosorbide mononitrate (IMDUR) 60 MG 24 hr tablet Take 1 tablet (  60 mg total) by mouth daily.  30 tablet  6  . metoprolol (LOPRESSOR) 50 MG tablet Take 1 tablet (50 mg total) by mouth 2 (two) times daily. Take 1 1/2 tablets twice a day  100 tablet  6  . nitroGLYCERIN (NITROSTAT) 0.4 MG SL tablet Place 1 tablet (0.4 mg total) under the tongue every 5 (five) minutes as needed.  25 tablet  6   Allergies  Allergen Reactions  . Iohexol Shortness Of Breath and Itching    Pt was given 100cc of Omnipaque 300 followed by itching/ dyspnea.  . Budesonide-Formoterol Fumarate     REACTION: Mouth and tongue swelling  . Iodinated Diagnostic Agents     Iodine pt had a reaction when he had a CT DONE  . Meperidine Hcl   . Simvastatin   . Sulfonamide Derivatives     History  Substance Use Topics  . Smoking status: Never Smoker   . Smokeless tobacco: Not  on file  . Alcohol Use: No    Family History  Problem Relation Age of Onset  . Diabetes Father   . Heart disease Father   . Asthma Father   . Heart disease Mother     CABG hx age 93  . Colon cancer Son     hx   . Colitis Son     hx  . Crohn's disease Son      Objective:   Patient Vitals for the past 8 hrs:  BP Temp Pulse Resp SpO2 Height Weight  03/23/12 1402 150/58 mmHg - 65  17  97 % - -  03/23/12 1359 - 98.4 F (36.9 C) - - - - -  03/23/12 1326 - - - - - 5\' 11"  (1.803 m) 76.204 kg (168 lb)         See MD Preop evaluation      Assessment:   Active Problems:  * No active hospital problems. *    Plan:   Outpatient dilatation plan. Procedure has been discussed in office with patient.

## 2012-03-23 NOTE — Op Note (Signed)
[facility] [address] [place], [place]  [address]  ENDOSCOPY PROCEDURE REPORT  PATIENT:  [name], [name]  MR#:  [id number] BIRTHDATE:  06/28/1931, 80 yrs. old  GENDER:  male  ENDOSCOPIST:  Carman Ching Referred by:  Pricilla Riffle, M.D.  PROCEDURE DATE:  03/23/2012 PROCEDURE:  EGD with dilatation over guidewire ASA CLASS:  Class III INDICATIONS:  stricture patient has had previous dilatation and having some increased dysphagia.  MEDICATIONS:   Fentanyl 75 mcg IV, Versed 6 mg IV TOPICAL ANESTHETIC:  Cetacaine Spray  DESCRIPTION OF PROCEDURE:   After the risks and benefits of the procedure were explained, informed consent was obtained.  The Pentax Gastroscope [id number] endoscope was introduced through the mouth and advanced to the second portion of the duodenum.  The instrument was slowly withdrawn as the mucosa was fully examined. <<PROCEDUREIMAGES>>  the duodenal bulb and second duodenum were without abnormalities. nl stomach.  A stricture was found in the distal esophagus. this was very minimal. The scope easily passed. The esophagus was otherwise completely normal. No inflammation hiatal hernia et Karie Soda. savary / guidewire 15 mm Savary 16 mm minimal resistance no heme on dilator    Retroflexed views revealed no abnormalities. The scope was then withdrawn from the patient and the procedure completed.  COMPLICATIONS:  A complication of none occurred on 03/23/2012 at.  ENDOSCOPIC IMPRESSION: 1) Nl duodenum 2) Nl stomach 3) Stricture in the distal esophagus dilated 15 and 16 mm RECOMMENDATIONS: 1) Clear liquids for 6 hours, call for chest pain. Soft diet today, resume regular diet tomorrow. we'll continue current medications follow back in the office in 2 months  ______________________________ Carman Ching  CC:  Pricilla Riffle, MD  n. Rosalie DoctorCarman Ching at 03/23/2012 03:26 PM  Ernst Breach, [id number]

## 2012-03-26 ENCOUNTER — Encounter (HOSPITAL_COMMUNITY): Payer: Self-pay | Admitting: Gastroenterology

## 2012-03-26 ENCOUNTER — Encounter (HOSPITAL_COMMUNITY): Payer: Self-pay

## 2012-04-14 ENCOUNTER — Emergency Department (HOSPITAL_COMMUNITY): Payer: Medicare Other

## 2012-04-14 ENCOUNTER — Encounter (HOSPITAL_COMMUNITY): Payer: Self-pay | Admitting: *Deleted

## 2012-04-14 ENCOUNTER — Inpatient Hospital Stay (HOSPITAL_COMMUNITY)
Admission: EM | Admit: 2012-04-14 | Discharge: 2012-04-16 | DRG: 309 | Disposition: A | Discharge status: Home / Self Care | Payer: Medicare Other | Attending: Internal Medicine | Admitting: Internal Medicine

## 2012-04-14 DIAGNOSIS — I10 Essential (primary) hypertension: Secondary | ICD-10-CM | POA: Diagnosis present

## 2012-04-14 DIAGNOSIS — K219 Gastro-esophageal reflux disease without esophagitis: Secondary | ICD-10-CM | POA: Diagnosis present

## 2012-04-14 DIAGNOSIS — I2510 Atherosclerotic heart disease of native coronary artery without angina pectoris: Secondary | ICD-10-CM | POA: Diagnosis present

## 2012-04-14 DIAGNOSIS — E785 Hyperlipidemia, unspecified: Secondary | ICD-10-CM | POA: Diagnosis present

## 2012-04-14 DIAGNOSIS — J45909 Unspecified asthma, uncomplicated: Secondary | ICD-10-CM | POA: Diagnosis present

## 2012-04-14 DIAGNOSIS — K222 Esophageal obstruction: Secondary | ICD-10-CM | POA: Diagnosis present

## 2012-04-14 DIAGNOSIS — I200 Unstable angina: Secondary | ICD-10-CM | POA: Diagnosis present

## 2012-04-14 DIAGNOSIS — R079 Chest pain, unspecified: Secondary | ICD-10-CM

## 2012-04-14 DIAGNOSIS — Z981 Arthrodesis status: Secondary | ICD-10-CM

## 2012-04-14 DIAGNOSIS — Z9861 Coronary angioplasty status: Secondary | ICD-10-CM

## 2012-04-14 DIAGNOSIS — I498 Other specified cardiac arrhythmias: Principal | ICD-10-CM | POA: Diagnosis present

## 2012-04-14 LAB — CARDIAC PANEL(CRET KIN+CKTOT+MB+TROPI)
CK, MB: 3.7 ng/mL (ref 0.3–4.0)
CK, MB: 3.5 ng/mL (ref 0.3–4.0)
Relative Index: 3.6 — ABNORMAL HIGH (ref 0.0–2.5)
Relative Index: INVALID (ref 0.0–2.5)
Relative Index: INVALID (ref 0.0–2.5)
Troponin I: <0.3 ng/mL (ref <0.30)
Troponin I: <0.3 ng/mL (ref <0.30)

## 2012-04-14 LAB — CBC WITH DIFFERENTIAL/PLATELET
Basophils Absolute: 0.1 10*3/uL (ref 0.0–0.1)
Eosinophils Absolute: 0.3 10*3/uL (ref 0.0–0.7)
Eosinophils Relative: 6 % — ABNORMAL HIGH (ref 0–5)
MCH: 30.6 pg (ref 26.0–34.0)
MCHC: 34.1 g/dL (ref 30.0–36.0)
MCV: 89.8 fL (ref 78.0–100.0)
Platelets: 212 10*3/uL (ref 150–400)
RDW: 13.2 % (ref 11.5–15.5)
WBC: 5.4 10*3/uL (ref 4.0–10.5)

## 2012-04-14 LAB — DIFFERENTIAL
Eosinophils Absolute: 0.3 10*3/uL (ref 0.0–0.7)
Lymphocytes Relative: 36 % (ref 12–46)
Lymphs Abs: 1.8 10*3/uL (ref 0.7–4.0)
Monocytes Relative: 8 % (ref 3–12)
Neutrophils Relative %: 50 % (ref 43–77)

## 2012-04-14 LAB — COMPREHENSIVE METABOLIC PANEL
ALT: 12 U/L (ref 0–53)
ALT: 10 U/L (ref 0–53)
AST: 17 U/L (ref 0–37)
Alkaline Phosphatase: 84 U/L (ref 39–117)
BUN: 7 mg/dL (ref 6–23)
CO2: 29 mEq/L (ref 19–32)
Calcium: 9.3 mg/dL (ref 8.4–10.5)
Chloride: 106 mEq/L (ref 96–112)
Creatinine, Ser: 0.82 mg/dL (ref 0.50–1.35)
GFR calc Af Amer: >90 mL/min (ref >90)
GFR calc Af Amer: >90 mL/min (ref >90)
GFR calc non Af Amer: 85 mL/min — ABNORMAL LOW (ref >90)
Glucose, Bld: 96 mg/dL (ref 70–99)
Potassium: 4.1 mEq/L (ref 3.5–5.1)
Sodium: 141 mEq/L (ref 135–145)
Sodium: 142 mEq/L (ref 135–145)
Total Bilirubin: 0.5 mg/dL (ref 0.3–1.2)
Total Protein: 6.9 g/dL (ref 6.0–8.3)
Total Protein: 6.3 g/dL (ref 6.0–8.3)

## 2012-04-14 LAB — APTT: aPTT: 34 seconds (ref 24–37)

## 2012-04-14 LAB — CBC
Hemoglobin: 12.4 g/dL — ABNORMAL LOW (ref 13.0–17.0)
MCH: 30.5 pg (ref 26.0–34.0)
RBC: 4.06 MIL/uL — ABNORMAL LOW (ref 4.22–5.81)
WBC: 5.1 10*3/uL (ref 4.0–10.5)

## 2012-04-14 LAB — POCT I-STAT TROPONIN I

## 2012-04-14 MED ORDER — ASPIRIN 81 MG PO TABS: 81.0000 mg | ORAL_TABLET | Freq: Every day | ORAL | Status: DC

## 2012-04-14 MED ORDER — CLOPIDOGREL BISULFATE 75 MG PO TABS
75.0000 mg | ORAL_TABLET | Freq: Every day | ORAL | Status: DC
  Administered 2012-04-15 – 2012-04-16 (×2): 75 mg via ORAL
  Filled 2012-04-14 (×2): qty 1

## 2012-04-14 MED ORDER — SODIUM CHLORIDE 0.9 % IV SOLN
Freq: Once | INTRAVENOUS | Status: AC
  Administered 2012-04-14: 20 mL/h via INTRAVENOUS

## 2012-04-14 MED ORDER — NITROGLYCERIN IN D5W 200-5 MCG/ML-% IV SOLN
2.0000 ug/min | Freq: Once | INTRAVENOUS | Status: AC
  Administered 2012-04-14: 5 ug/min via INTRAVENOUS
  Filled 2012-04-14: qty 250

## 2012-04-14 MED ORDER — DOXAZOSIN MESYLATE 4 MG PO TABS
4.0000 mg | ORAL_TABLET | Freq: Every day | ORAL | Status: DC
  Administered 2012-04-14 – 2012-04-15 (×2): 4 mg via ORAL
  Filled 2012-04-14 (×3): qty 1

## 2012-04-14 MED ORDER — ASPIRIN EC 81 MG PO TBEC: 81.0000 mg | DELAYED_RELEASE_TABLET | Freq: Every day | ORAL | Status: DC

## 2012-04-14 MED ORDER — HEPARIN (PORCINE) IN NACL 100-0.45 UNIT/ML-% IJ SOLN
900.0000 [IU]/h | INTRAMUSCULAR | Status: DC
  Administered 2012-04-14: 900 [IU]/h via INTRAVENOUS
  Filled 2012-04-14: qty 250

## 2012-04-14 MED ORDER — ONDANSETRON HCL 4 MG/2ML IJ SOLN
4.0000 mg | Freq: Once | INTRAMUSCULAR | Status: AC
  Administered 2012-04-14: 4 mg via INTRAVENOUS
  Filled 2012-04-14: qty 2

## 2012-04-14 MED ORDER — ASPIRIN EC 81 MG PO TBEC
81.0000 mg | DELAYED_RELEASE_TABLET | Freq: Every day | ORAL | Status: DC
  Administered 2012-04-15 – 2012-04-16 (×2): 81 mg via ORAL
  Filled 2012-04-14 (×2): qty 1

## 2012-04-14 MED ORDER — PANTOPRAZOLE SODIUM 40 MG PO TBEC
80.0000 mg | DELAYED_RELEASE_TABLET | Freq: Every day | ORAL | Status: DC
  Administered 2012-04-15 – 2012-04-16 (×2): 80 mg via ORAL
  Filled 2012-04-14: qty 1
  Filled 2012-04-14: qty 2
  Filled 2012-04-14: qty 1

## 2012-04-14 MED ORDER — NITROGLYCERIN 0.4 MG SL SUBL: 0.4000 mg | SUBLINGUAL_TABLET | SUBLINGUAL | Status: DC | PRN

## 2012-04-14 MED ORDER — FLUTICASONE PROPIONATE HFA 220 MCG/ACT IN AERO
2.0000 | INHALATION_SPRAY | Freq: Two times a day (BID) | RESPIRATORY_TRACT | Status: DC
  Administered 2012-04-14 – 2012-04-16 (×4): 2 via RESPIRATORY_TRACT
  Filled 2012-04-14: qty 12

## 2012-04-14 MED ORDER — METOPROLOL TARTRATE 50 MG PO TABS
50.0000 mg | ORAL_TABLET | Freq: Two times a day (BID) | ORAL | Status: DC
  Administered 2012-04-14 (×2): 50 mg via ORAL
  Filled 2012-04-14 (×3): qty 1
  Filled 2012-04-14: qty 2

## 2012-04-14 MED ORDER — FUROSEMIDE 20 MG PO TABS
20.0000 mg | ORAL_TABLET | Freq: Every day | ORAL | Status: DC
  Administered 2012-04-15 – 2012-04-16 (×2): 20 mg via ORAL
  Filled 2012-04-14 (×2): qty 1

## 2012-04-14 MED ORDER — ONDANSETRON HCL 4 MG/2ML IJ SOLN: 4.0000 mg | Freq: Four times a day (QID) | INTRAMUSCULAR | Status: DC | PRN

## 2012-04-14 MED ORDER — ATORVASTATIN CALCIUM 40 MG PO TABS
40.0000 mg | ORAL_TABLET | Freq: Every day | ORAL | Status: DC
  Administered 2012-04-14 – 2012-04-15 (×2): 40 mg via ORAL
  Filled 2012-04-14 (×3): qty 1

## 2012-04-14 MED ORDER — MORPHINE SULFATE 4 MG/ML IJ SOLN
4.0000 mg | Freq: Once | INTRAMUSCULAR | Status: AC
  Administered 2012-04-14: 4 mg via INTRAVENOUS
  Filled 2012-04-14: qty 1

## 2012-04-14 MED ORDER — HEPARIN BOLUS VIA INFUSION
3500.0000 [IU] | Freq: Once | INTRAVENOUS | Status: AC
  Administered 2012-04-14: 3500 [IU] via INTRAVENOUS
  Filled 2012-04-14: qty 3500

## 2012-04-14 MED ORDER — NITROGLYCERIN IN D5W 200-5 MCG/ML-% IV SOLN: 3.0000 ug/min | INTRAVENOUS | Status: DC

## 2012-04-14 MED ORDER — ACETAMINOPHEN 325 MG PO TABS: 650.0000 mg | ORAL_TABLET | ORAL | Status: DC | PRN

## 2012-04-14 NOTE — ED Notes (Signed)
Reports pain started this am 0600 under left arm, radiates to left side of chest and is a tightness in his chest. ekg being done at triage, no acute distress noted at this time.

## 2012-04-14 NOTE — H&P (Signed)
Admit date: 04/14/2012 Referring Physician Dr. Manus Gunning  Primary Cardiologist Dr. Tenny Craw Chief complaint/reason for admission:chest pain  HPI: This is a 78yo WM with a history of CAD s/p multiple PCI's in the past with the most recent being a year ago at which time he underwent cutting balloon PCI to an 80% diagonal branch.  He was in his USOH until 6am this am when he started having chest pain.  He described the pain asa squeezing sensation which started in his left shoulder blade and radiated under his axilla and into his chest.  He also had pain down his left arm.  This is typical of his usual anginal pain.  He was SOB and nauseated but denied any diaphoresis.  He took 3 SL NTG without any improvement and 3 baby ASA.   Currently he denies any chest pain on IV NTG gtt.    PMH:    Past Medical History  Diagnosis Date  . Hypertension   . Hyperlipidemia   . Chronic chest pain   . Dyspnea     chronic  . GERD (gastroesophageal reflux disease)     h/o esophageal spasm  . Asthma   . Back pain   . Coronary artery disease     a. s/p BMS to RCA in 2002; b. s/p cutting balloon POBA ;   c. cath 6/12: oDx 80% (treated with repeat cutting balloon POBA), mLAD 50% with 30-40% at Dx, CFX 30%, pRCA 25% with patent stents    PSH:    Past Surgical History  Procedure Date  . Cardiac catheterization   . Neck surgery     multiple  . Back surgery     multiple  . Laminectomy   . Posterior laminectomy / decompression cervical spine   . Anterior cervical decomp/discectomy fusion   . Coronary angioplasty with stent placement      previous percutaneous intervention on the  RCA and the diagonal branch  . Esophagogastroduodenoscopy 03/23/2012    Procedure: ESOPHAGOGASTRODUODENOSCOPY (EGD);  Surgeon: Vertell Novak., MD;  Location: [facility];  Service: Endoscopy;  Laterality: N/A;  . Savory dilation 03/23/2012    Procedure: SAVORY DILATION;  Surgeon: Vertell Novak., MD;  Location: [facility];   Service: Endoscopy;  Laterality: N/A;    ALLERGIES:   Iohexol; Budesonide-formoterol fumarate; Iodinated diagnostic agents; Meperidine hcl; Simvastatin; and Sulfonamide derivatives  Prior to Admit Meds:   (Not in a hospital admission) Family HX:    Family History  Problem Relation Age of Onset  . Diabetes Father   . Heart disease Father   . Asthma Father   . Heart disease Mother     CABG hx age 35  . Colon cancer Son     hx   . Colitis Son     hx  . Crohn's disease Son    Social HX:    History   Social History  . Marital Status: Married    Spouse Name: N/A    Number of Children: N/A  . Years of Education: N/A   Occupational History  . retired     Forensic scientist   Social History Main Topics  . Smoking status: Never Smoker   . Smokeless tobacco: Not on file  . Alcohol Use: No  . Drug Use: No  . Sexually Active: Not on file   Other Topics Concern  . Not on file   Social History Narrative  . No narrative on file     ROS:  All 11 ROS were addressed and are negative except what is stated in the HPI  PHYSICAL EXAM Filed Vitals:   04/14/12 1031  BP: 193/68  Pulse: 60  Temp: 98 F (36.7 C)  Resp: 20   General: Well developed, well nourished, in no acute distress Head: Eyes PERRLA, No xanthomas.   Normal cephalic and atramatic  Lungs:   Clear bilaterally to auscultation and percussion. Heart:   HRRR S1 S2 Pulses are 2+ & equal.            No carotid bruit. No JVD.  No abdominal bruits. No femoral bruits. Abdomen: Bowel sounds are positive, abdomen soft and non-tender without masses  Extremities:   No clubbing, cyanosis or edema.  DP +1 Neuro: Alert and oriented X 3. Psych:  Good affect, responds appropriately   Labs:   Lab Results  Component Value Date   WBC 5.4 04/14/2012   HGB 13.2 04/14/2012   HCT 38.7* 04/14/2012   MCV 89.8 04/14/2012   PLT 212 04/14/2012   No results found for this basename:  NA,K,CL,CO2,BUN,CREATININE,CALCIUM,LABALBU,PROT,BILITOT,ALKPHOS,ALT,AST,GLUCOSE in the last 168 hours Lab Results  Component Value Date   CKTOTAL 87 11-15-2010   CKMB 3.6 11-15-2010   TROPONINI <0.30 11-15-2010   No results found for this basename: PTT   Lab Results  Component Value Date   INR 1.03 04/06/2011   INR 1.0 01/03/2011   INR 1.10 04/23/2010     Lab Results  Component Value Date   CHOL 162 06/28/2011   CHOL 142 10/22/2010   CHOL 134 10/14/2009   Lab Results  Component Value Date   HDL 46 06/28/2011   HDL 47.00 10/22/2010   HDL 46.00 10/14/2009   Lab Results  Component Value Date   LDLCALC 100* 06/28/2011   LDLCALC 83 10/22/2010   LDLCALC 73 10/14/2009   Lab Results  Component Value Date   TRIG 82 06/28/2011   TRIG 62.0 10/22/2010   TRIG 75.0 10/14/2009   Lab Results  Component Value Date   CHOLHDL 3.5 06/28/2011   CHOLHDL 3 10/22/2010   CHOLHDL 3 10/14/2009   No results found for this basename: LDLDIRECT      Radiology:  *RADIOLOGY REPORT*  Clinical Data: Chest tightness. Shortness of breath.  PORTABLE CHEST - 1 VIEW  Comparison: Chest x-ray 06/26/2011.  Findings: Lung volumes are normal. No consolidative airspace  disease. No pleural effusions. No pneumothorax. No pulmonary  nodule or mass noted. Pulmonary vasculature and the  cardiomediastinal silhouette are within normal limits. Orthopedic  fixation hardware is incompletely visualized in the lower cervical  spine. Atherosclerotic calcifications are noted within the  thoracic aorta.  IMPRESSION:  1. No radiographic evidence of acute cardiopulmonary disease.  2. Atherosclerosis  Original Report Authenticated By: Florencia Reasons, M.D.   EKG:  Sinus bradycardia  ASSESSMENT:  1.  Unstable angina pectoris with negative cardiac enzymes x 1.  Currently pain free on IV NTG gtt.  He has a history of chronic chest pain with history of esophageal spasm. 2.  CAD with most recent PCI of diagonal a year ago 3.  HTN  controlled 4.  Dyslipidemia 5.  GERD with esophageal spasm  PLAN:   1.  Admit to tele bed 2.  Cycle cardiac enzymes 3.  IV NTG gtt 4.  IV Heparin gtt until rule out complete 5.  NPO after midnight 6.  Consider nuclear stress test in am if he rules out for MI 7.  Continue ASA/statin/Plavix/betablocker Quintella Reichert, MD  04/14/2012  11:58 AM

## 2012-04-14 NOTE — Progress Notes (Signed)
04/14/12 1233  OTHER  CSW Follow Up Status No follow-up needed     Consult unit based LCSW and/or CM if needs/concerns arise.

## 2012-04-14 NOTE — ED Notes (Signed)
Dr Mayford Knife at the patient bedside

## 2012-04-14 NOTE — Progress Notes (Signed)
04/14/12 1232  Discharge Planning  Type of Residence Private residence  Living Arrangements Spouse/significant other [name] (wife) [phone])  Support Systems Spouse/significant other;Children  Do you have any problems obtaining your medications? No  Family/patient expects to be discharged to: Private residence  Once you are discharged, how will you get to your follow-up appointment? Family;Self  Expected Discharge Date 04/16/12  Case Management Consult Needed No  Social Work Consult Needed No     Please consult unit based LCSW if disposition needs and/or psychosocial needs arise.   Dionne Milo MSW [facility]. Weekend/Social Worker [phone]

## 2012-04-14 NOTE — Progress Notes (Addendum)
ANTICOAGULATION CONSULT NOTE - Initial Consult  Pharmacy Consult for Heparin Indication: chest pain/ACS  Allergies  Allergen Reactions  . Iohexol Shortness Of Breath and Itching    Pt was given 100cc of Omnipaque 300 followed by itching/ dyspnea.  . Budesonide-Formoterol Fumarate     REACTION: Mouth and tongue swelling  . Iodinated Diagnostic Agents     Iodine pt had a reaction when he had a CT DONE  . Meperidine Hcl   . Simvastatin   . Sulfonamide Derivatives     Patient Measurements: Height: 5\' 11"  (180.3 cm) Weight: 168 lb (76.204 kg) IBW/kg (Calculated) : 75.3  Heparin Dosing Weight: 76.2 kg  Vital Signs: Temp: 98 F (36.7 C) (06/29 1031) Temp src: Oral (06/29 1031) BP: 146/98 mmHg (06/29 1230) Pulse Rate: 59  (06/29 1230)  Labs:  Basename 04/14/12 1055  HGB 13.2  HCT 38.7*  PLT 212  APTT --  LABPROT --  INR --  HEPARINUNFRC --  CREATININE 0.82  CKTOTAL 103  CKMB 3.7  TROPONINI <0.30    Estimated Creatinine Clearance: 79.1 ml/min (by C-G formula based on Cr of 0.82).   Medical History: Past Medical History  Diagnosis Date  . Hypertension   . Hyperlipidemia   . Chronic chest pain   . Dyspnea     chronic  . GERD (gastroesophageal reflux disease)     h/o esophageal spasm  . Asthma   . Back pain   . Coronary artery disease     a. s/p BMS to RCA in 2002; b. s/p cutting balloon POBA ;   c. cath 6/12: oDx 80% (treated with repeat cutting balloon POBA), mLAD 50% with 30-40% at Dx, CFX 30%, pRCA 25% with patent stents    Assessment: 78 y.o. M admitted with ACS sx to start heparin for anticoagulation while awaiting further cardiac evaluation. Per discussion with the patient -- he has had no recent surgeries, s/sx of bleeding, or hx CVA. Heparin dosing wt: 76.2 kg, Hgb/Hcg 13.2/38.7, plts 212. SCr 0.82, CrCl~60-80 ml/min.   Goal of Therapy:  Heparin level 0.3-0.7 units/ml Monitor platelets by anticoagulation protocol: Yes   Plan:  1. Heparin bolus of  3500 units x 1 2. Initiate heparin drip at rate of 900 units/hr (9 ml/hr) 3. Daily heparin levels, CBC 4. Will continue to monitor for any signs/symptoms of bleeding and will follow up with heparin level in 8 hours   Georgina Pillion, PharmD, BCPS Clinical Pharmacist Pager: [phone] 04/14/2012 1:41 PM

## 2012-04-14 NOTE — ED Provider Notes (Signed)
History     CSN: [id number]  Arrival date & time 04/14/12  1029   First MD Initiated Contact with Patient 04/14/12 1038      Chief Complaint  Patient presents with  . Chest Pain    (Consider location/radiation/quality/duration/timing/severity/associated sxs/prior treatment) HPI Comments: Patient has had constant, squeezing left-sided chest pain for the past 5 hours is similar to his previous angina. Underneath his left arm radiates to the left side of his chest and back. Associated with shortness of breath nausea. He took nitroglycerin x3 at home with no relief. He did take aspirin today. No leg pain or swelling. States compliance with his Plavix and aspirin.  The history is provided by the patient.    Past Medical History  Diagnosis Date  . Hypertension   . Hyperlipidemia   . Chronic chest pain   . Dyspnea     chronic  . GERD (gastroesophageal reflux disease)     h/o esophageal spasm  . Asthma   . Back pain   . Coronary artery disease     a. s/p BMS to RCA in 2002; b. s/p cutting balloon POBA ;   c. cath 6/12: oDx 80% (treated with repeat cutting balloon POBA), mLAD 50% with 30-40% at Dx, CFX 30%, pRCA 25% with patent stents    Past Surgical History  Procedure Date  . Cardiac catheterization   . Neck surgery     multiple  . Back surgery     multiple  . Laminectomy   . Posterior laminectomy / decompression cervical spine   . Anterior cervical decomp/discectomy fusion   . Coronary angioplasty with stent placement      previous percutaneous intervention on the  RCA and the diagonal branch  . Esophagogastroduodenoscopy 03/23/2012    Procedure: ESOPHAGOGASTRODUODENOSCOPY (EGD);  Surgeon: Vertell Novak., MD;  Location: [facility];  Service: Endoscopy;  Laterality: N/A;  . Savory dilation 03/23/2012    Procedure: SAVORY DILATION;  Surgeon: Vertell Novak., MD;  Location: [facility];  Service: Endoscopy;  Laterality: N/A;  . Tonsillectomy   . Eye surgery      Family History  Problem Relation Age of Onset  . Diabetes Father   . Heart disease Father   . Asthma Father   . Heart disease Mother     CABG hx age 90  . Colon cancer Son     hx   . Colitis Son     hx  . Crohn's disease Son     History  Substance Use Topics  . Smoking status: Never Smoker   . Smokeless tobacco: Not on file  . Alcohol Use: No      Review of Systems  Constitutional: Negative for fever, activity change and appetite change.  HENT: Negative for congestion and rhinorrhea.   Eyes: Negative for visual disturbance.  Respiratory: Positive for chest tightness and shortness of breath. Negative for cough.   Cardiovascular: Positive for chest pain.  Gastrointestinal: Positive for nausea. Negative for vomiting and abdominal pain.  Genitourinary: Negative for dysuria and hematuria.  Musculoskeletal: Negative for back pain.  Skin: Negative for rash.  Neurological: Negative for dizziness, weakness, light-headedness and headaches.    Allergies  Iohexol; Budesonide-formoterol fumarate; Iodinated diagnostic agents; Meperidine hcl; Simvastatin; and Sulfonamide derivatives  Home Medications   No current outpatient prescriptions on file.  BP 187/72  Pulse 50  Temp 97.7 F (36.5 C) (Oral)  Resp 30  Ht 5\' 11"  (1.803 m)  Wt 166 lb  8 oz (75.524 kg)  BMI 23.22 kg/m2  SpO2 98%  Physical Exam  Constitutional: He is oriented to person, place, and time. He appears well-developed and well-nourished. No distress.  HENT:  Head: Normocephalic and atraumatic.  Mouth/Throat: Oropharynx is clear and moist. No oropharyngeal exudate.  Eyes: Conjunctivae are normal. Pupils are equal, round, and reactive to light.  Neck: Normal range of motion.  Cardiovascular: Normal rate, regular rhythm and normal heart sounds.   No murmur heard. Pulmonary/Chest: Effort normal. No respiratory distress. He exhibits no tenderness.  Abdominal: Soft. There is no tenderness. There is no  rebound and no guarding.  Musculoskeletal: Normal range of motion. He exhibits no edema and no tenderness.  Neurological: He is oriented to person, place, and time. No cranial nerve deficit.  Skin: Skin is warm.    ED Course  Procedures (including critical care time)  Labs Reviewed  CBC WITH DIFFERENTIAL - Abnormal; Notable for the following:    HCT 38.7 (*)     Eosinophils Relative 6 (*)     All other components within normal limits  COMPREHENSIVE METABOLIC PANEL - Abnormal; Notable for the following:    Glucose, Bld 104 (*)     GFR calc non Af Amer 83 (*)     All other components within normal limits  CARDIAC PANEL(CRET KIN+CKTOT+MB+TROPI) - Abnormal; Notable for the following:    Relative Index 3.6 (*)     All other components within normal limits  CBC - Abnormal; Notable for the following:    RBC 4.06 (*)     Hemoglobin 12.4 (*)     HCT 36.5 (*)     All other components within normal limits  DIFFERENTIAL - Abnormal; Notable for the following:    Eosinophils Relative 6 (*)     All other components within normal limits  COMPREHENSIVE METABOLIC PANEL - Abnormal; Notable for the following:    Albumin 3.3 (*)     GFR calc non Af Amer 85 (*)     All other components within normal limits  POCT I-STAT TROPONIN I  CARDIAC PANEL(CRET KIN+CKTOT+MB+TROPI)  PROTIME-INR  APTT  HEPARIN LEVEL (UNFRACTIONATED)  CARDIAC PANEL(CRET KIN+CKTOT+MB+TROPI)  CARDIAC PANEL(CRET KIN+CKTOT+MB+TROPI)  TSH  OCCULT BLOOD X 1 CARD TO LAB, STOOL  HEPARIN LEVEL (UNFRACTIONATED)  CBC   Dg Chest Portable 1 View  04/14/2012  *RADIOLOGY REPORT*  Clinical Data: Chest tightness.  Shortness of breath.  PORTABLE CHEST - 1 VIEW  Comparison: Chest x-ray 06/26/2011.  Findings: Lung volumes are normal.  No consolidative airspace disease.  No pleural effusions.  No pneumothorax.  No pulmonary nodule or mass noted.  Pulmonary vasculature and the cardiomediastinal silhouette are within normal limits.  Orthopedic  fixation hardware is incompletely visualized in the lower cervical spine.  Atherosclerotic calcifications are noted within the thoracic aorta.  IMPRESSION: 1. No radiographic evidence of acute cardiopulmonary disease. 2.  Atherosclerosis  Original Report Authenticated By: Florencia Reasons, M.D.     1. Dyslipidemia       MDM  Left-sided chest pain similar to previous angina. Vital stable, EKG nonischemic. Aspirin are given. Start nitroglycerin, labs, chest x-ray. Last cath with 80% stenosis of diagonal requiring PCI.  Troponin negative, CP free on NTG gtt. Admission d/w Dr. Mayford Knife.  Date: 04/14/2012  Rate: 53  Rhythm: sinus bradycardia  QRS Axis: normal  Intervals: normal  ST/T Wave abnormalities: normal  Conduction Disutrbances:none  Narrative Interpretation:   Old EKG Reviewed: unchanged   CRITICAL CARE Performed by:  Dashonna Chagnon   Total critical care time: 30  Critical care time was exclusive of separately billable procedures and treating other patients.  Critical care was necessary to treat or prevent imminent or life-threatening deterioration.  Critical care was time spent personally by me on the following activities: development of treatment plan with patient and/or surrogate as well as nursing, discussions with consultants, evaluation of patient's response to treatment, examination of patient, obtaining history from patient or surrogate, ordering and performing treatments and interventions, ordering and review of laboratory studies, ordering and review of radiographic studies, pulse oximetry and re-evaluation of patient's condition.          Glynn Octave, MD 04/14/12 1753

## 2012-04-15 DIAGNOSIS — E785 Hyperlipidemia, unspecified: Secondary | ICD-10-CM

## 2012-04-15 LAB — CARDIAC PANEL(CRET KIN+CKTOT+MB+TROPI)
CK, MB: 3.2 ng/mL (ref 0.3–4.0)
Troponin I: <0.3 ng/mL (ref <0.30)

## 2012-04-15 MED ORDER — METOPROLOL TARTRATE 25 MG PO TABS
25.0000 mg | ORAL_TABLET | Freq: Two times a day (BID) | ORAL | Status: DC
  Administered 2012-04-15 – 2012-04-16 (×3): 25 mg via ORAL
  Filled 2012-04-15 (×4): qty 1

## 2012-04-15 MED ORDER — ISOSORBIDE MONONITRATE ER 30 MG PO TB24
30.0000 mg | ORAL_TABLET | Freq: Every day | ORAL | Status: DC
  Filled 2012-04-15: qty 1

## 2012-04-15 MED ORDER — AMLODIPINE BESYLATE 5 MG PO TABS
5.0000 mg | ORAL_TABLET | Freq: Every day | ORAL | Status: DC
  Administered 2012-04-15 – 2012-04-16 (×2): 5 mg via ORAL
  Filled 2012-04-15 (×2): qty 1

## 2012-04-15 MED ORDER — ISOSORBIDE MONONITRATE ER 60 MG PO TB24
60.0000 mg | ORAL_TABLET | Freq: Every day | ORAL | Status: DC
  Administered 2012-04-15 – 2012-04-16 (×2): 60 mg via ORAL
  Filled 2012-04-15 (×2): qty 1

## 2012-04-15 NOTE — Progress Notes (Signed)
ANTICOAGULATION CONSULT NOTE - Follow-Up Consult  Pharmacy Consult for Heparin Indication: chest pain/ACS  Allergies  Allergen Reactions  . Iohexol Shortness Of Breath and Itching    Pt was given 100cc of Omnipaque 300 followed by itching/ dyspnea.  . Budesonide-Formoterol Fumarate     REACTION: Mouth and tongue swelling  . Iodinated Diagnostic Agents     Iodine pt had a reaction when he had a CT DONE  . Meperidine Hcl   . Simvastatin   . Sulfonamide Derivatives     Patient Measurements: Height: 5\' 11"  (180.3 cm) Weight: 166 lb 8 oz (75.524 kg) IBW/kg (Calculated) : 75.3   Vital Signs: Temp: 98.2 F (36.8 C) (06/29 2121) Temp src: Oral (06/29 2121) BP: 152/73 mmHg (06/29 2121) Pulse Rate: 62  (06/29 2121)  Labs:  Basename 04/15/12 0229 04/14/12 2000 04/14/12 1400 04/14/12 1055  HGB -- -- 12.4* 13.2  HCT -- -- 36.5* 38.7*  PLT -- -- 197 212  APTT -- -- 34 --  LABPROT -- -- 14.6 --  INR -- -- 1.12 --  HEPARINUNFRC 0.35 -- -- --  CREATININE -- -- 0.76 0.82  CKTOTAL 75 86 93 --  CKMB 3.2 3.5 3.6 --  TROPONINI <0.30 <0.30 <0.30 --    Estimated Creatinine Clearance: 81.1 ml/min (by C-G formula based on Cr of 0.76).  Assessment: Heparin level is therapeutic. Enzymes remain negative. No bleeding noted.  Goal of Therapy:  Heparin level 0.3-0.7 units/ml Monitor platelets by anticoagulation protocol: Yes   Plan:  1. Continue heparin at 900 units/hr 2. F/u daily level  Christoper Fabian, PharmD, BCPS Clinical pharmacist, pager [phone] 04/15/2012 3:32 AM

## 2012-04-15 NOTE — Plan of Care (Signed)
Problem: Phase I Progression Outcomes Goal: Anginal pain relieved Outcome: Completed/Met Date Met:  04/15/12 Pain currently pain free, still on IV NTG

## 2012-04-15 NOTE — Progress Notes (Addendum)
SUBJECTIVE:No further chest pain. Rested.  Active Problems:  ESSENTIAL HYPERTENSION  CAD, NATIVE VESSEL  ESOPHAGEAL STRICTURE  GERD   LABS: Basic Metabolic Panel:  Basename 04/14/12 1400 04/14/12 1055  NA 142 141  K 4.1 4.0  CL 106 104  CO2 29 29  GLUCOSE 96 104*  BUN 7 8  CREATININE 0.76 0.82  CALCIUM 9.0 9.3  MG -- --  PHOS -- --   Liver Function Tests:  Basename 04/14/12 1400 04/14/12 1055  AST 16 17  ALT 10 12  ALKPHOS 84 91  BILITOT 0.5 0.5  PROT 6.3 6.9  ALBUMIN 3.3* 3.5  CBC:  Basename 04/14/12 1400 04/14/12 1055  WBC 5.1 5.4  NEUTROABS 2.5 2.9  HGB 12.4* 13.2  HCT 36.5* 38.7*  MCV 89.9 89.8  PLT 197 212   Cardiac Enzymes:  Basename 04/15/12 0229 04/14/12 2000 04/14/12 1400  CKTOTAL 75 86 93  CKMB 3.2 3.5 3.6  CKMBINDEX -- -- --  TROPONINI <0.30 <0.30 <0.30   Thyroid Function Tests:  Basename 04/14/12 1400  TSH 1.469  T4TOTAL --  T3FREE --  THYROIDAB --    RADIOLOGY: Dg Chest Portable 1 View  04/14/2012  *RADIOLOGY REPORT*  Clinical Data: Chest tightness.  Shortness of breath.  PORTABLE CHEST - 1 VIEW  Comparison: Chest x-ray 06/26/2011.  Findings: Lung volumes are normal.  No consolidative airspace disease.  No pleural effusions.  No pneumothorax.  No pulmonary nodule or mass noted.  Pulmonary vasculature and the cardiomediastinal silhouette are within normal limits.  Orthopedic fixation hardware is incompletely visualized in the lower cervical spine.  Atherosclerotic calcifications are noted within the thoracic aorta.  IMPRESSION: 1. No radiographic evidence of acute cardiopulmonary disease. 2.  Atherosclerosis  Original Report Authenticated By: Florencia Reasons, M.D.     PHYSICAL EXAM BP 163/73  Pulse 47  Temp 97.9 F (36.6 C) (Oral)  Resp 20  Ht 5\' 11"  (1.803 m)  Wt 165 lb 1.6 oz (74.889 kg)  BMI 23.03 kg/m2  SpO2 97% General: Well developed, well nourished, in no acute distress Head: Eyes PERRLA, No xanthomas.   Normal  cephalic and atramatic  Lungs: Clear bilaterally to auscultation and percussion. Heart: HRRR S1 S2,bradycardic No MRG .  Pulses are 2+ & equal.            No carotid bruit. No JVD.  No abdominal bruits. No femoral bruits. Abdomen: Bowel sounds are positive, abdomen soft and non-tender without masses or                  Hernia's noted. Msk:  Back normal, normal gait. Normal strength and tone for age. Extremities: No clubbing, cyanosis or edema.  DP +1 Neuro: Alert and oriented X 3. Psych:  Good affect, responds appropriately  TELEMETRY: Reviewed telemetry pt in Sinus Bradycardia in the 40's.  ASSESSMENT AND PLAN:  1. Chest Pain: No further chest pain on NTG drip. I have discontinued this along with heparin.Cardiac enzymes are negative. EKG shows no evidence of ACS, but bradycardic. I will decrease metoprolol from 50 mg BID to 25 mg BID and monitor HR response. Will restart isosorbide SR at 60 mg daily. Last OP stress test in 9/12 was negative for ischemia. Consider Ranxea on follow up.  2. Hypertension: BP is not well controlled. Will add Norvasc 5 mg daily to medication regimen with reduction of BB. Follow response.   3. CAD: He is  s/p multiple PCI's in the past with the most recent being  a year ago at which time he underwent cutting balloon PCI to an 80% diagonal branch. s/p stent to the RCA in the past as well as cutting balloon angioplasty to the Dx. He has chronic chest pain and was recently admitted for recurrent symptoms 6/20-6/22. MI was r/o and cardiac cath demonstrated oDx 80%, mLAD 50% with 30-40% at the level of the Dx, CFX 30%, pRCA 25% with patent stent. He had cutting balloon PTCA done again to the Dx. Dr. Excell Seltzer did his intervention and he felt that the patient should be continued on dual antiplatelet therapy and aggressive medical management  EKG shows no evidence of ACS, negative CE. X 3.   4. Bradycardia: This may be etiology of chest discomfort as this occurred in early  morning hours. He is on metoprolol 50 mg BID at home. As above, will reduce the dose and evaluate HR response.   Bettey Mare. Lyman Bishop NP [facility] 04/15/2012, 7:35 AM  Patient examined and agree except changes made. Home later today or tomorrow.  Valera Castle, MD 04/15/2012 10:59 AM

## 2012-04-16 DIAGNOSIS — R079 Chest pain, unspecified: Secondary | ICD-10-CM

## 2012-04-16 LAB — CBC
HCT: 39.5 % (ref 39.0–52.0)
Hemoglobin: 13.4 g/dL (ref 13.0–17.0)
MCV: 89.4 fL (ref 78.0–100.0)
RDW: 13.1 % (ref 11.5–15.5)
WBC: 7.1 10*3/uL (ref 4.0–10.5)

## 2012-04-16 MED ORDER — PANTOPRAZOLE SODIUM 40 MG PO TBEC: 80.0000 mg | DELAYED_RELEASE_TABLET | Freq: Two times a day (BID) | ORAL | Status: DC

## 2012-04-16 MED ORDER — AMLODIPINE BESYLATE 5 MG PO TABS: 5.0000 mg | ORAL_TABLET | Freq: Every day | ORAL | Status: DC

## 2012-04-16 MED ORDER — METOPROLOL TARTRATE 25 MG PO TABS: 25.0000 mg | ORAL_TABLET | Freq: Two times a day (BID) | ORAL | Status: DC

## 2012-04-16 MED ORDER — POTASSIUM CHLORIDE ER 10 MEQ PO TBCR: 10.0000 meq | EXTENDED_RELEASE_TABLET | Freq: Every day | ORAL | Status: DC

## 2012-04-16 NOTE — Discharge Instructions (Signed)
PLEASE REMEMBER TO BRING ALL OF YOUR MEDICATIONS TO EACH OF YOUR FOLLOW-UP OFFICE VISITS.  PLEASE TAKE ALL NEW MEDICATIONS/MEDICATION CHANGES AS PRESCRIBED.   PLEASE ATTEND ALL FOLLOW-UP APPOINTMENTS.

## 2012-04-16 NOTE — Progress Notes (Signed)
Discharge instructions given to pt . Verbalized understanding Wheeled to lobby by Nt

## 2012-04-16 NOTE — Discharge Summary (Signed)
Discharge Summary   Patient ID: [name],  MRN: [id number], DOB/AGE: 23-Oct-1930 78 y.o.  Admit date: 04/14/2012 Discharge date: 04/16/2012  Discharge Diagnoses Principal Problem:  *Chest pain Active Problems:  HYPERLIPIDEMIA-MIXED  ESSENTIAL HYPERTENSION  CAD, NATIVE VESSEL  ESOPHAGEAL STRICTURE  GERD   Allergies Allergies  Allergen Reactions  . Iohexol Shortness Of Breath and Itching    Pt was given 100cc of Omnipaque 300 followed by itching/ dyspnea.  . Budesonide-Formoterol Fumarate     REACTION: Mouth and tongue swelling  . Iodinated Diagnostic Agents     Iodine pt had a reaction when he had a CT DONE  . Meperidine Hcl   . Simvastatin   . Sulfonamide Derivatives     Diagnostic Studies/Procedures  PORTABLE CHEST X-RAY -04/14/12  Comparison: Chest x-ray 06/26/2011.  Findings: Lung volumes are normal. No consolidative airspace  disease. No pleural effusions. No pneumothorax. No pulmonary  nodule or mass noted. Pulmonary vasculature and the  cardiomediastinal silhouette are within normal limits. Orthopedic  fixation hardware is incompletely visualized in the lower cervical  spine. Atherosclerotic calcifications are noted within the  thoracic aorta.  IMPRESSION:  1. No radiographic evidence of acute cardiopulmonary disease.  2. Atherosclerosis  History of Present Illness  [name] is a 78yo Caucasian male with the above problem list who was admitted to [facility] on 04/14/12 for chest pain. He has a history of CAD s/p multiple PCIs in the past, most recently 1 year ago when he underwent cutting balloon angioplasty to an 80% diagonal branch. He takes ASA/Plavix/BB/statin/NTG SL/Imdur.  He was in his USOH until 6:00 AM the morning of admission when he experienced "squeezing" chest pain radiating to his L shoulder, L-sided chest, axilla and L arm, and reminiscent of his typical angina. He reported associated sob. He took NTG SL x 3 without relief. He  subsequently presented to [facility] ED.  Upon ED arrival, EKG revealed mild sinus bradycardia without ischemic changes. Initial troponin-I was WNL. CXR as above was without acute cardiopulmonary disease. BMET and CBC unremarkable. TSH WNL. Given the patient's cardiac history and symptoms concerning for unstable angina, he was placed on heparin, NTG IV and admitted for ACS rule out.   Hospital Course   The patient's symptoms improved on the above regimen. His cardiac biomarkers returned WNL x 3 (troponin-I WNL x 4). Heparin and NTG gtt was discontinued. The patient was noted to be bradycardic overnight (HR 50s) and mildly hypertensive. The patient's bradycardia was suspected to have produced his angina. Subsequently, Lopressor was reduced, and Norvasc added for BP control. Imdur, which had not been continued on admission, was resumed. There were no further events and the patient's symptoms improved.   He was assessed by Dr. Tenny Craw this morning and found to be stable for discharge. He will be discharged on ASA/Plavix/reduced BB/statin/Imdur/NTG SL PRN. Norvasc will be added. Nexium was replaced with Protonix to prevent Plavix inhibition. Finally, KCl was added to supplement potassium lost as a result of daily Lasix. He will follow-up in the office in 2 weeks as scheduled below. This information, including activity restrictions, has been clearly outlined in the discharge AVS.   Discharge Vitals:  Blood pressure 136/69, pulse 62, temperature 98 F (36.7 C), temperature source Oral, resp. rate 18, height 5\' 11"  (1.803 m), weight 74 kg (163 lb 2.3 oz), SpO2 98.00%.  Weight change: -2.204 kg (-4 lb 13.8 oz)  Labs: Recent Labs  Basename 04/16/12 0510 04/14/12 1400   WBC  7.1 5.1   HGB 13.4 12.4*   HCT 39.5 36.5*   MCV 89.4 89.9   PLT 224 197   Lab 04/14/12 1400 04/14/12 1055  NA 142 141  K 4.1 4.0  CL 106 104  CO2 29 29  BUN 7 8  CREATININE 0.76 0.82  CALCIUM 9.0 9.3  PROT 6.3 --  BILITOT 0.5  --  ALKPHOS 84 --  ALT 10 --  AST 16 --  AMYLASE -- --  LIPASE -- --  GLUCOSE 96 104*   Recent Labs  Basename 04/15/12 0229 04/14/12 2000 04/14/12 1400   CKTOTAL 75 86 93   CKMB 3.2 3.5 3.6   CKMBINDEX -- -- --   TROPONINI <0.30 <0.30 <0.30   Basename 04/14/12 1400  TSH 1.469  T4TOTAL --  T3FREE --  THYROIDAB --    Disposition:  Discharge Orders    Future Appointments: Provider: Department: Dept Phone: Center:   04/27/2012 10:10 AM Beatrice Lecher, PA [facility] [phone] [facility]   07/06/2012 11:45 AM Pricilla Riffle, MD [facility] [phone] [facility]     Follow-up Information    Follow up with Tereso Newcomer, PA on 04/27/2012. (At 10:10 AM for post-hospital follow-up. )    Contact information:   [address] [place] [address] [phone]         Discharge Medications:  Medication List  As of 04/16/2012  4:10 PM   START taking these medications         amLODipine 5 MG tablet   Commonly known as: NORVASC   Take 1 tablet (5 mg total) by mouth daily.      pantoprazole 40 MG tablet   Commonly known as: PROTONIX   Take 2 tablets (80 mg total) by mouth 2 (two) times daily.         CHANGE how you take these medications         metoprolol tartrate 25 MG tablet   Commonly known as: LOPRESSOR   Take 1 tablet (25 mg total) by mouth 2 (two) times daily.   What changed: - medication strength - dose - doctor's instructions         CONTINUE taking these medications         aspirin EC 81 MG tablet      atorvastatin 40 MG tablet   Commonly known as: LIPITOR      clopidogrel 75 MG tablet   Commonly known as: PLAVIX      doxazosin 4 MG tablet   Commonly known as: CARDURA   Take 1 tablet (4 mg total) by mouth at bedtime.      fluticasone 220 MCG/ACT inhaler   Commonly known as: FLOVENT HFA      furosemide 20 MG tablet   Commonly known as: LASIX   Take 1 tablet (20 mg total) by mouth daily.       gabapentin 100 MG capsule   Commonly known as: NEURONTIN      isosorbide mononitrate 60 MG 24 hr tablet   Commonly known as: IMDUR   Take 1 tablet (60 mg total) by mouth daily.      nitroGLYCERIN 0.4 MG SL tablet   Commonly known as: NITROSTAT   Place 1 tablet (0.4 mg total) under the tongue every 5 (five) minutes as needed.         STOP taking these medications         esomeprazole 40 MG capsule  Where to get your medications    These are the prescriptions that you need to pick up. We sent them to a specific pharmacy, so you will need to go there to get them.   [facility] - [place], [place] - [address]    [address] [place] [address]    Phone: [phone]        amLODipine 5 MG tablet   metoprolol tartrate 25 MG tablet   pantoprazole 40 MG tablet           Outstanding Labs/Studies: None  Duration of Discharge Encounter: Greater than 30 minutes including physician time.  Signed, R. Hurman Horn, PA-C 04/16/2012, 4:11 PM

## 2012-04-16 NOTE — Progress Notes (Signed)
Utilization review completed.  

## 2012-04-16 NOTE — Progress Notes (Signed)
Patient reported no social work needs at this time. Patient was seen by ER CSW over the weekend.  Clinical Social Worker will sign off for now as social work intervention is no longer needed. Please consult [place] again if new need arises.

## 2012-04-16 NOTE — Progress Notes (Signed)
Subjective: Patient denies CP  No SOB.    Walked halls yesterday Objective: Filed Vitals:   04/15/12 2100 04/15/12 2226 04/16/12 0152 04/16/12 0456  BP: 154/74  131/73 133/78  Pulse: 60 90 60 59  Temp: 98.2 F (36.8 C)  98.1 F (36.7 C) 98.4 F (36.9 C)  TempSrc: Oral  Oral Oral  Resp: 16  18 18   Height:      Weight:    163 lb 2.3 oz (74 kg)  SpO2: 97%  98% 98%   Weight change: -4 lb 13.8 oz (-2.204 kg)  Intake/Output Summary (Last 24 hours) at 04/16/12 0846 Last data filed at 04/16/12 0827  Gross per 24 hour  Intake   1160 ml  Output   3300 ml  Net  -2140 ml    General: Alert, awake, oriented x3, in no acute distress Neck:  JVP is normal Heart: Regular rate and rhythm, without murmurs, rubs, gallops.  Lungs: Clear to auscultation.  No rales or wheezes. Exemities:  No edema.   Neuro: Grossly intact, nonfocal.  Tele  SR. Lab Results: Results for orders placed during the hospital encounter of 04/14/12 (from the past 24 hour(s))  HEPARIN LEVEL (UNFRACTIONATED)     Status: Abnormal   Collection Time   04/15/12  9:40 AM      Component Value Range   Heparin Unfractionated 0.12 (*) 0.30 - 0.70 IU/mL  CBC     Status: Normal   Collection Time   04/16/12  5:10 AM      Component Value Range   WBC 7.1  4.0 - 10.5 K/uL   RBC 4.42  4.22 - 5.81 MIL/uL   Hemoglobin 13.4  13.0 - 17.0 g/dL   HCT 16.1  09.6 - 04.5 %   MCV 89.4  78.0 - 100.0 fL   MCH 30.3  26.0 - 34.0 pg   MCHC 33.9  30.0 - 36.0 g/dL   RDW 40.9  81.1 - 91.4 %   Platelets 224  150 - 400 K/uL    Studies/Results: Dg Chest Portable 1 View  04/14/2012  *RADIOLOGY REPORT*  Clinical Data: Chest tightness.  Shortness of breath.  PORTABLE CHEST - 1 VIEW  Comparison: Chest x-ray 06/26/2011.  Findings: Lung volumes are normal.  No consolidative airspace disease.  No pleural effusions.  No pneumothorax.  No pulmonary nodule or mass noted.  Pulmonary vasculature and the cardiomediastinal silhouette are within normal limits.   Orthopedic fixation hardware is incompletely visualized in the lower cervical spine.  Atherosclerotic calcifications are noted within the thoracic aorta.  IMPRESSION: 1. No radiographic evidence of acute cardiopulmonary disease. 2.  Atherosclerosis  Original Report Authenticated By: Florencia Reasons, M.D.    Medications: Reviewed.   Patient Active Hospital Problem List: ESSENTIAL HYPERTENSION (10/20/2008)   Assessment: BP is adequate.    CAD, NATIVE VESSEL (10/20/2008)   Assessment: Patient currently w/o symptoms.    Backed down on b blocker.  May be doing better with a little higher HR, though still on slow side.  If still feels sluggish could back down further.  D/C today with outpatient f/u in 2 wks.  LOS: 2 days   Dietrich Pates 04/16/2012, 8:46 AM

## 2012-04-17 ENCOUNTER — Telehealth: Payer: Self-pay | Admitting: Internal Medicine

## 2012-04-17 DIAGNOSIS — R55 Syncope and collapse: Secondary | ICD-10-CM

## 2012-04-17 NOTE — Telephone Encounter (Signed)
Pt passed out at home yesterday and he didn't come around til about later and he wanted to know what Dr. Tenny Craw thought he should do

## 2012-04-17 NOTE — Telephone Encounter (Addendum)
Called patient back. He states that he came home from the hospital last night at 6 pm and passed out for apx. 15 minutes. His grandson and his girlfriend broke his fall ( he was sitting in a chair ). The girlfriend took his BP and it was 124/82 but she did not obtain a heart rate. The BP at bedtime was 136/72 with a heart rate of 84. He states that he ate a small amount of food at 12 noon but nothing for the next few hours. Advised will discuss with Dr.Ross and call him back.  Dr.Ross advised that if he has another episode of syncope he needs to call 911 and go to the ER. Event monitor also ordered and patient advised to eat frequent small meals.

## 2012-04-18 ENCOUNTER — Encounter (INDEPENDENT_AMBULATORY_CARE_PROVIDER_SITE_OTHER): Payer: Medicare Other

## 2012-04-18 DIAGNOSIS — R55 Syncope and collapse: Secondary | ICD-10-CM

## 2012-04-27 ENCOUNTER — Encounter: Payer: Medicare Other | Admitting: Physician Assistant

## 2012-04-27 ENCOUNTER — Encounter: Payer: Self-pay | Admitting: Physician Assistant

## 2012-04-27 ENCOUNTER — Ambulatory Visit (INDEPENDENT_AMBULATORY_CARE_PROVIDER_SITE_OTHER): Payer: Medicare Other | Admitting: Physician Assistant

## 2012-04-27 VITALS — BP 130/80 | HR 68 | Ht 70.0 in | Wt 167.1 lb

## 2012-04-27 DIAGNOSIS — R55 Syncope and collapse: Secondary | ICD-10-CM

## 2012-04-27 DIAGNOSIS — K219 Gastro-esophageal reflux disease without esophagitis: Secondary | ICD-10-CM

## 2012-04-27 DIAGNOSIS — I10 Essential (primary) hypertension: Secondary | ICD-10-CM

## 2012-04-27 DIAGNOSIS — I2510 Atherosclerotic heart disease of native coronary artery without angina pectoris: Secondary | ICD-10-CM

## 2012-04-27 DIAGNOSIS — R079 Chest pain, unspecified: Secondary | ICD-10-CM

## 2012-04-27 MED ORDER — PANTOPRAZOLE SODIUM 40 MG PO TBEC: 40.0000 mg | DELAYED_RELEASE_TABLET | Freq: Two times a day (BID) | ORAL | Status: DC

## 2012-04-27 NOTE — Progress Notes (Signed)
[address] [place], [place]  [address] Phone: [phone] Fax:  [phone]  Date:  04/27/2012   Name:  [name]   DOB:  19-Mar-1931   MRN:  [id number]  PCP:  Rush Barer, PA  Primary Cardiologist:  Dr. Dietrich Pates  Primary Electrophysiologist:  None    History of Present Illness: [name] is a 78 y.o. male who returns for post hospital follow up.  He has a h/o CAD, s/p stent to the RCA in the past as well as cutting balloon angioplasty to the Dx. He has chronic chest pain and was admitted for recurrent symptoms 03/2011.  LHC demonstrated oDx 80%, mLAD 50% with 30-40% at the level of the Dx, CFX 30%, pRCA 25% with patent stent. He had cutting balloon PTCA done again to the Dx.  Dr. Excell Seltzer did his intervention and he felt that the patient should be continued on dual antiplatelet therapy and aggressive medical management. He noted that the patient has had chronic chest pain and many catheterization procedures. If he has recurrent chest pain, he should have a nuclear stress test. Dr. Excell Seltzer did not think there was much more that could be safely done with his diagonal ostium without jeopardizing the LAD.  He did feel that significant ischemia should be proven before considering repeat PCI.  He was admitted with chest pain in 06/2011.  Myoview 06/26/11: No ischemia or scar, EF 48%.  He has continued to have chest symptoms.  When last seen by Dr. Tenny Craw in 01/2012, it was recommended that he followup with gastroenterology.  He was admitted 6/29-7/1 with chest pain.  He ruled out for myocardial infarction by enzymes.  He was noted to be bradycardic and his beta blocker was adjusted.  Amlodipine was added for blood pressure control.  Since discharge from the hospital, he is called in with complaints of syncope.  Dr. Tenny Craw recommended an event monitor.  Of note, last echo 04/2010: EF 55-60%, grade 1 diastolic dysfunction, MAC, mild MR, mild LAE, trivial TR.  I have reviewed  the available strips from his event monitor which he is currently wearing.  Thus far, this demonstrates sinus rhythm and occasional PVCs.  No bradycardia arrhythmias or pauses noted.  No ventricular arrhythmias noted.  He notes that he passed out while sitting on a stool at home.  He had no prodrome.  He felt clammy when he awoke.  He was told he was out for about 10 minutes.  No CPR was performed.  He denies any recurrence.  He denies significant dyspnea.  He does note an episode of chest pain about one to 2 weeks ago.  This resolved with TUMS.  He denies orthopnea, PND or edema.  He still feels tired with no energy.  Wt Readings from Last 3 Encounters:  04/27/12 167 lb 1.9 oz (75.805 kg)  04/16/12 163 lb 2.3 oz (74 kg)  03/23/12 168 lb (76.204 kg)     Potassium  Date/Time Value Range Status  04/14/2012  2:00 PM 4.1  3.5 - 5.1 mEq/L Final     Creatinine, Ser  Date/Time Value Range Status  04/14/2012  2:00 PM 0.76  0.50 - 1.35 mg/dL Final     ALT  Date/Time Value Range Status  04/14/2012  2:00 PM 10  0 - 53 U/L Final     TSH  Date/Time Value Range Status  04/14/2012  2:00 PM 1.469  0.350 - 4.500 uIU/mL Final     Hemoglobin  Date/Time Value Range Status  04/16/2012  5:10 AM 13.4  13.0 - 17.0 g/dL Final    Past Medical History  Diagnosis Date  . Hypertension   . Hyperlipidemia   . Chronic chest pain   . Dyspnea     chronic  . GERD (gastroesophageal reflux disease)     h/o esophageal spasm  . Asthma   . Back pain   . Coronary artery disease     a. s/p BMS to RCA in 2002; b. s/p cutting balloon POBA ;   c. cath 6/12: oDx 80% (treated with repeat cutting balloon POBA), mLAD 50% with 30-40% at Dx, CFX 30%, pRCA 25% with patent stents    Current Outpatient Prescriptions  Medication Sig Dispense Refill  . amLODipine (NORVASC) 5 MG tablet Take 1 tablet (5 mg total) by mouth daily.  30 tablet  3  . aspirin EC 81 MG tablet Take 81 mg by mouth daily.      Marland Kitchen atorvastatin (LIPITOR)  40 MG tablet Take 40 mg by mouth daily.      . clopidogrel (PLAVIX) 75 MG tablet Take 75 mg by mouth daily.      Marland Kitchen doxazosin (CARDURA) 4 MG tablet Take 1 tablet (4 mg total) by mouth at bedtime.  30 tablet  6  . fluticasone (FLOVENT HFA) 220 MCG/ACT inhaler Inhale 2 puffs into the lungs 2 (two) times daily.        . furosemide (LASIX) 20 MG tablet Take 1 tablet (20 mg total) by mouth daily.  30 tablet  6  . gabapentin (NEURONTIN) 100 MG capsule Take 100 mg by mouth daily as needed. For nerve pain.      . isosorbide mononitrate (IMDUR) 60 MG 24 hr tablet Take 1 tablet (60 mg total) by mouth daily.  30 tablet  6  . metoprolol tartrate (LOPRESSOR) 25 MG tablet Take 1 tablet (25 mg total) by mouth 2 (two) times daily.  60 tablet  3  . nitroGLYCERIN (NITROSTAT) 0.4 MG SL tablet Place 1 tablet (0.4 mg total) under the tongue every 5 (five) minutes as needed.  25 tablet  6  . pantoprazole (PROTONIX) 40 MG tablet Take 2 tablets (80 mg total) by mouth 2 (two) times daily.  120 tablet  3    Allergies: Allergies  Allergen Reactions  . Iohexol Shortness Of Breath and Itching    Pt was given 100cc of Omnipaque 300 followed by itching/ dyspnea.  . Budesonide-Formoterol Fumarate     REACTION: Mouth and tongue swelling  . Iodinated Diagnostic Agents     Iodine pt had a reaction when he had a CT DONE  . Meperidine Hcl   . Simvastatin   . Sulfonamide Derivatives     History  Substance Use Topics  . Smoking status: Never Smoker   . Smokeless tobacco: Not on file  . Alcohol Use: No     ROS:  Please see the history of present illness.    All other systems reviewed and negative.   PHYSICAL EXAM: VS:  BP 130/80  Pulse 68  Ht 5\' 10"  (1.778 m)  Wt 167 lb 1.9 oz (75.805 kg)  BMI 23.98 kg/m2 Well nourished, well developed, in no acute distress HEENT: normal Neck: no JVD Cardiac:  normal S1, S2; RRR; no murmur Lungs:  clear to auscultation bilaterally, no wheezing, rhonchi or rales Abd: soft,  nontender, no hepatomegaly Ext: no edema Skin: warm and dry Neuro:  CNs 2-12 intact, no focal abnormalities noted  EKG:  Sinus rhythm, heart rate 68, normal axis, no acute changes      ASSESSMENT AND PLAN:  1.  Syncope  Unexplained. EF was 48% by nuclear scan in 06/2011.  As noted, no significant arrhythmias thus far on his monitor. He will continue to wear his event monitor. No driving until seen in followup by Dr. Tenny Craw. Keep appointment with Dr. Tenny Craw in September. He continues to feel weak and sluggish.  I will decrease his metoprolol further to 12.5 mg twice a day.  2.  CAD No angina.  Continue aspirin and Plavix.  3.  Chest pain Likely related to GERD.  See below.  4.  Hypertension Controlled.  5.  GERD He sees his gastroenterologist soon.  Continue Protonix 40 mg twice a day.  He could consider adding an H2 receptor antagonist at bedtime.  I will leave this up to his gastroenterologist.   Signed, Tereso Newcomer, PA-C  10:59 AM 04/27/2012

## 2012-04-27 NOTE — Patient Instructions (Addendum)
Your physician has recommended you make the following change in your medication: DECREASE METOPROLOL 12.5 MG TWICE DAILY; DECREASE PROTONIX 40 MG TWICE DAILY  NO DRIVING UNTIL YOU SEE DR. ROSS  FINISH WEARING YOUR MONITOR PER SCOTT WEAVER, PAC

## 2012-04-30 ENCOUNTER — Encounter: Payer: Medicare Other | Admitting: Physician Assistant

## 2012-05-14 ENCOUNTER — Telehealth: Payer: Self-pay | Admitting: *Deleted

## 2012-05-14 NOTE — Telephone Encounter (Signed)
Called patient with event monitor and advised that there were no significant arrhythmias per Dr.Ross.

## 2012-07-06 ENCOUNTER — Encounter: Payer: Self-pay | Admitting: Internal Medicine

## 2012-07-06 ENCOUNTER — Ambulatory Visit (INDEPENDENT_AMBULATORY_CARE_PROVIDER_SITE_OTHER): Payer: Medicare Other | Admitting: Internal Medicine

## 2012-07-06 VITALS — BP 134/74 | HR 75 | Ht 71.0 in | Wt 172.0 lb

## 2012-07-06 DIAGNOSIS — L309 Dermatitis, unspecified: Secondary | ICD-10-CM

## 2012-07-06 DIAGNOSIS — L259 Unspecified contact dermatitis, unspecified cause: Secondary | ICD-10-CM

## 2012-07-06 DIAGNOSIS — N400 Enlarged prostate without lower urinary tract symptoms: Secondary | ICD-10-CM

## 2012-07-06 DIAGNOSIS — I25810 Atherosclerosis of coronary artery bypass graft(s) without angina pectoris: Secondary | ICD-10-CM

## 2012-07-06 NOTE — Patient Instructions (Addendum)
Your physician recommends that you continue on your current medications as directed. Please refer to the Current Medication list given to you today. Your physician wants you to follow-up in: 6 months with Dr. Ross.  You will receive a reminder letter in the mail two months in advance. If you don't receive a letter, please call our office to schedule the follow-up appointment.  

## 2012-07-06 NOTE — Progress Notes (Signed)
HPI He has a h/o CAD, s/p stent to the RCA in the past as well as cutting balloon angioplasty to the Dx. He has chronic chest pain and was admitted for recurrent symptoms 03/2011. LHC demonstrated oDx 80%, mLAD 50% with 30-40% at the level of the Dx, CFX 30%, pRCA 25% with patent stent. He had cutting balloon PTCA done again to the Dx. Dr. Excell Seltzer did his intervention and he felt that the patient should be continued on dual antiplatelet therapy and aggressive medical management. He noted that the patient has had chronic chest pain and many catheterization procedures. If he has recurrent chest pain, he should have a nuclear stress test. Dr. Excell Seltzer did not think there was much more that could be safely done with his diagonal ostium without jeopardizing the LAD. He did feel that significant ischemia should be proven before considering repeat PCI. He was admitted with chest pain in 06/2011. Myoview 06/26/11: No ischemia or scar, EF 48%. Marland Kitchen  He was admitted 6/29-7/1 with chest pain. He ruled out for myocardial infarction by enzymes. He was noted to be bradycardic and his beta blocker was adjusted. Amlodipine was added for blood pressure control.  ONe episode of syncope on day of d/c    He thinks it may have happened because he hadn't had much to eat/drink.  Weak. Event monitor negative Since seen has not had any furhter spells of syncope.   Somce seem has had no chest tightness.  No signif SOB   Gets tired Gives out just coming hair. Due in GI next Monday.  Still with IBS symptoms. Allergies  Allergen Reactions  . Iohexol Shortness Of Breath and Itching    Pt was given 100cc of Omnipaque 300 followed by itching/ dyspnea.  . Budesonide-Formoterol Fumarate     REACTION: Mouth and tongue swelling  . Iodinated Diagnostic Agents     Iodine pt had a reaction when he had a CT DONE  . Meperidine Hcl   . Simvastatin   . Sulfonamide Derivatives     Current Outpatient Prescriptions  Medication Sig Dispense Refill  .  amLODipine (NORVASC) 5 MG tablet Take 1 tablet (5 mg total) by mouth daily.  30 tablet  3  . aspirin EC 81 MG tablet Take 81 mg by mouth daily.      Marland Kitchen atorvastatin (LIPITOR) 40 MG tablet Take 40 mg by mouth daily.      . clopidogrel (PLAVIX) 75 MG tablet Take 75 mg by mouth daily.      Marland Kitchen doxazosin (CARDURA) 4 MG tablet Take 1 tablet (4 mg total) by mouth at bedtime.  30 tablet  6  . fluocinonide gel (LIDEX) 0.05 % AS DIRECTED FOR INSECT BITES      . fluticasone (FLOVENT HFA) 220 MCG/ACT inhaler Inhale 2 puffs into the lungs 2 (two) times daily.        . furosemide (LASIX) 20 MG tablet Take 1 tablet (20 mg total) by mouth daily.  30 tablet  6  . gabapentin (NEURONTIN) 100 MG capsule Take 100 mg by mouth daily as needed. For nerve pain.      . isosorbide mononitrate (IMDUR) 60 MG 24 hr tablet Take 1 tablet (60 mg total) by mouth daily.  30 tablet  6  . metoprolol tartrate (LOPRESSOR) 25 MG tablet Take 0.5 tablets (12.5 mg total) by mouth 2 (two) times daily.      . nitroGLYCERIN (NITROSTAT) 0.4 MG SL tablet Place 1 tablet (0.4 mg total) under the  tongue every 5 (five) minutes as needed.  25 tablet  6  . pantoprazole (PROTONIX) 40 MG tablet Take 1 tablet (40 mg total) by mouth 2 (two) times daily.  60 tablet  6  . potassium chloride (K-DUR,KLOR-CON) 10 MEQ tablet 1 TAB DAILY      . Tamsulosin HCl (FLOMAX) 0.4 MG CAPS 1 TAB DAILY        Past Medical History  Diagnosis Date  . Hypertension   . Hyperlipidemia   . Chronic chest pain   . Dyspnea     chronic  . GERD (gastroesophageal reflux disease)     h/o esophageal spasm  . Asthma   . Back pain   . Coronary artery disease     a. s/p BMS to RCA in 2002; b. s/p cutting balloon POBA ;   c. cath 6/12: oDx 80% (treated with repeat cutting balloon POBA), mLAD 50% with 30-40% at Dx, CFX 30%, pRCA 25% with patent stents    Past Surgical History  Procedure Date  . Cardiac catheterization   . Neck surgery     multiple  . Back surgery      multiple  . Laminectomy   . Posterior laminectomy / decompression cervical spine   . Anterior cervical decomp/discectomy fusion   . Coronary angioplasty with stent placement      previous percutaneous intervention on the  RCA and the diagonal branch  . Esophagogastroduodenoscopy 03/23/2012    Procedure: ESOPHAGOGASTRODUODENOSCOPY (EGD);  Surgeon: Vertell Novak., MD;  Location: [facility];  Service: Endoscopy;  Laterality: N/A;  . Savory dilation 03/23/2012    Procedure: SAVORY DILATION;  Surgeon: Vertell Novak., MD;  Location: [facility];  Service: Endoscopy;  Laterality: N/A;  . Tonsillectomy   . Eye surgery     Family History  Problem Relation Age of Onset  . Diabetes Father   . Heart disease Father   . Asthma Father   . Heart disease Mother     CABG hx age 74  . Colon cancer Son     hx   . Colitis Son     hx  . Crohn's disease Son     History   Social History  . Marital Status: Married    Spouse Name: N/A    Number of Children: N/A  . Years of Education: N/A   Occupational History  . retired     Forensic scientist   Social History Main Topics  . Smoking status: Never Smoker   . Smokeless tobacco: Not on file  . Alcohol Use: No  . Drug Use: No  . Sexually Active: No   Other Topics Concern  . Not on file   Social History Narrative  . No narrative on file    Review of Systems:  All systems reviewed.  They are negative to the above problem except as previously stated.  Vital Signs: BP 134/74  Pulse 75  Ht 5\' 11"  (1.803 m)  Wt 172 lb (78.019 kg)  BMI 23.99 kg/m2  Physical Exam Patient is in NAD HEENT:  Normocephalic, atraumatic. EOMI, PERRLA.  Neck: JVP is normal.   Lungs: clear to auscultation. No rales no wheezes.  Heart: Regular rate and rhythm. Normal S1, S2. No S3.   No significant murmurs. PMI not displaced.  Abdomen:  Supple, nontender. Normal bowel sounds. No masses. No hepatomegaly.  Extremities:   No lower extremity edema.    Musculoskeletal :moving all extremities.  Neuro:   alert and  oriented x3.  CN II-XII grossly intact.   Assessment and Plan:  1.  CAD.  I am not convinced of active angina.  Diffuse disease  I would  recomm that he continue on curreing regime.  2.  IBS  Suggested [place].  3.  HL  Keep on meds.

## 2012-08-02 ENCOUNTER — Encounter (HOSPITAL_COMMUNITY): Payer: Self-pay | Admitting: Nurse Practitioner

## 2012-08-02 ENCOUNTER — Emergency Department (HOSPITAL_COMMUNITY)
Admission: EM | Admit: 2012-08-02 | Discharge: 2012-08-02 | Disposition: A | Discharge status: Home / Self Care | Payer: Medicare Other | Attending: Emergency Medicine | Admitting: Emergency Medicine

## 2012-08-02 ENCOUNTER — Emergency Department (HOSPITAL_COMMUNITY): Payer: Medicare Other

## 2012-08-02 DIAGNOSIS — E785 Hyperlipidemia, unspecified: Secondary | ICD-10-CM | POA: Insufficient documentation

## 2012-08-02 DIAGNOSIS — I2510 Atherosclerotic heart disease of native coronary artery without angina pectoris: Secondary | ICD-10-CM | POA: Insufficient documentation

## 2012-08-02 DIAGNOSIS — I10 Essential (primary) hypertension: Secondary | ICD-10-CM | POA: Insufficient documentation

## 2012-08-02 DIAGNOSIS — K219 Gastro-esophageal reflux disease without esophagitis: Secondary | ICD-10-CM | POA: Insufficient documentation

## 2012-08-02 DIAGNOSIS — G8929 Other chronic pain: Secondary | ICD-10-CM | POA: Insufficient documentation

## 2012-08-02 DIAGNOSIS — I252 Old myocardial infarction: Secondary | ICD-10-CM | POA: Insufficient documentation

## 2012-08-02 DIAGNOSIS — M129 Arthropathy, unspecified: Secondary | ICD-10-CM | POA: Insufficient documentation

## 2012-08-02 DIAGNOSIS — M1990 Unspecified osteoarthritis, unspecified site: Secondary | ICD-10-CM

## 2012-08-02 DIAGNOSIS — M549 Dorsalgia, unspecified: Secondary | ICD-10-CM | POA: Insufficient documentation

## 2012-08-02 HISTORY — DX: Acute myocardial infarction, unspecified: I21.9

## 2012-08-02 MED ORDER — OXYCODONE-ACETAMINOPHEN 5-325 MG PO TABS: 2.0000 | ORAL_TABLET | ORAL | Status: DC | PRN

## 2012-08-02 MED ORDER — OXYCODONE-ACETAMINOPHEN 5-325 MG PO TABS
2.0000 | ORAL_TABLET | Freq: Once | ORAL | Status: AC
  Administered 2012-08-02: 2 via ORAL
  Filled 2012-08-02: qty 2

## 2012-08-02 NOTE — ED Provider Notes (Signed)
History  This chart was scribed for Richardean Canal, MD by Charolett Bumpers . The patient was seen in room TR09C/TR09C. Patient's care was started at 1522.   CSN: [id number] Arrival date & time 08/02/12  1451  First MD Initiated Contact with Patient 08/02/12 1522      Chief Complaint  Patient presents with  . Hip Pain   The history is provided by the patient. No language interpreter was used.   [name] is a 78 y.o. male who presents to the Emergency Department complaining of constant, moderate, worsening back and left hip pain for the past few months. He states his PCP evaluated him for same and given Vicodin with no relief. He reports he last took Vicodin last night. He reports his symptoms are aggravated with standing. He denies any known injuries or falls. He states he is able to ambulate but with pain. He denies any urinary or bowel incontinence. He states imaging has not been preformed. He is scheduled to follow up with Dr. Sandria Manly, orthopedics. He has a h/o arthritis. No h/o hip replacements. He reports a h/o multiple back surgeries.   Past Medical History  Diagnosis Date  . Hypertension   . Hyperlipidemia   . Chronic chest pain   . Dyspnea     chronic  . GERD (gastroesophageal reflux disease)     h/o esophageal spasm  . Asthma   . Back pain   . Coronary artery disease     a. s/p BMS to RCA in 2002; b. s/p cutting balloon POBA ;   c. cath 6/12: oDx 80% (treated with repeat cutting balloon POBA), mLAD 50% with 30-40% at Dx, CFX 30%, pRCA 25% with patent stents  . Myocardial infarction     Past Surgical History  Procedure Date  . Cardiac catheterization   . Neck surgery     multiple  . Back surgery     multiple  . Laminectomy   . Posterior laminectomy / decompression cervical spine   . Anterior cervical decomp/discectomy fusion   . Coronary angioplasty with stent placement      previous percutaneous intervention on the  RCA and the diagonal branch  .  Esophagogastroduodenoscopy 03/23/2012    Procedure: ESOPHAGOGASTRODUODENOSCOPY (EGD);  Surgeon: Vertell Novak., MD;  Location: [facility];  Service: Endoscopy;  Laterality: N/A;  . Savory dilation 03/23/2012    Procedure: SAVORY DILATION;  Surgeon: Vertell Novak., MD;  Location: [facility];  Service: Endoscopy;  Laterality: N/A;  . Tonsillectomy   . Eye surgery     Family History  Problem Relation Age of Onset  . Diabetes Father   . Heart disease Father   . Asthma Father   . Heart disease Mother     CABG hx age 17  . Colon cancer Son     hx   . Colitis Son     hx  . Crohn's disease Son     History  Substance Use Topics  . Smoking status: Never Smoker   . Smokeless tobacco: Not on file  . Alcohol Use: No      Review of Systems  Constitutional: Negative for fever and chills.  Respiratory: Negative for shortness of breath.   Gastrointestinal: Negative for nausea and vomiting.       No bowel incontinence.   Genitourinary:       No urinary incontinence.   Musculoskeletal: Positive for back pain and arthralgias.  Left hip pain  Neurological: Negative for weakness.  All other systems reviewed and are negative.    Allergies  Iohexol; Budesonide-formoterol fumarate; Iodinated diagnostic agents; Meperidine hcl; Simvastatin; and Sulfonamide derivatives  Home Medications   Current Outpatient Rx  Name Route Sig Dispense Refill  . ALBUTEROL SULFATE HFA 108 (90 BASE) MCG/ACT IN AERS Inhalation Inhale 2 puffs into the lungs every 6 (six) hours as needed. For shortness of breath    . AMLODIPINE BESYLATE 5 MG PO TABS Oral Take 1 tablet (5 mg total) by mouth daily. 30 tablet 3  . ASPIRIN EC 81 MG PO TBEC Oral Take 81 mg by mouth daily.    . ATORVASTATIN CALCIUM 40 MG PO TABS Oral Take 40 mg by mouth daily.    Marland Kitchen VITAMIN D 1000 UNITS PO TABS Oral Take 1,000 Units by mouth daily.    Marland Kitchen CLOPIDOGREL BISULFATE 75 MG PO TABS Oral Take 75 mg by mouth daily.    .  COLESTIPOL HCL 1 G PO TABS Oral Take 1 g by mouth 2 (two) times daily as needed. For diarrhea    . DOXAZOSIN MESYLATE 4 MG PO TABS Oral Take 1 tablet (4 mg total) by mouth at bedtime. 30 tablet 6  . FLUTICASONE PROPIONATE  HFA 220 MCG/ACT IN AERO Inhalation Inhale 1 puff into the lungs 2 (two) times daily as needed. For shortness of breath    . FUROSEMIDE 40 MG PO TABS Oral Take 40 mg by mouth daily as needed. For fluid retension    . ISOSORBIDE MONONITRATE ER 60 MG PO TB24 Oral Take 1 tablet (60 mg total) by mouth daily. 30 tablet 6  . METOPROLOL TARTRATE 25 MG PO TABS Oral Take 25 mg by mouth 2 (two) times daily.     Marland Kitchen NITROGLYCERIN 0.4 MG SL SUBL Sublingual Place 1 tablet (0.4 mg total) under the tongue every 5 (five) minutes as needed. 25 tablet 6  . PANTOPRAZOLE SODIUM 40 MG PO TBEC Oral Take 1 tablet (40 mg total) by mouth 2 (two) times daily. 60 tablet 6  . POTASSIUM CHLORIDE CRYS ER 10 MEQ PO TBCR Oral Take 10 mEq by mouth daily.    Marland Kitchen TAMSULOSIN HCL 0.4 MG PO CAPS Oral Take 0.4 mg by mouth daily.    Marland Kitchen VITAMIN B-12 1000 MCG PO TABS Oral Take 1,000 mcg by mouth daily.      BP 127/95  Pulse 82  Temp 98.6 F (37 C) (Oral)  Resp 16  SpO2 100%  Physical Exam  Nursing note and vitals reviewed. Constitutional: He is oriented to person, place, and time. He appears well-developed and well-nourished. No distress.       NAD  HENT:  Head: Normocephalic and atraumatic.  Eyes: EOM are normal.  Neck: Neck supple. No tracheal deviation present.  Cardiovascular: Normal rate, regular rhythm and normal heart sounds.   Pulmonary/Chest: Effort normal and breath sounds normal. No respiratory distress. He has no wheezes.  Abdominal: Soft. There is no tenderness.  Musculoskeletal: Normal range of motion. He exhibits tenderness.       Well healed midline surgical scars consistent with back surgeries. Left para lumbar tenderness noted. Hip ROM normal bilaterally. Negative SLR bilaterally. No saddle  anesthesia.  Neurological: He is alert and oriented to person, place, and time.       Lower extremities are neurovascularly intact.   Skin: Skin is warm and dry.  Psychiatric: He has a normal mood and affect. His behavior is normal.  ED Course  Procedures (including critical care time)  DIAGNOSTIC STUDIES: Oxygen Saturation is 100% on room air, normal by my interpretation.    COORDINATION OF CARE:  15:32-Discussed planned course of treatment with the patient including x-rays and pain medication, who is agreeable at this time.   15:45-Medication Orders: Oxycodone-acetaminophen (Percocet/Roxicet) 5-325 mg per tablet 2 tablet-once.   Labs Reviewed - No data to display Dg Lumbar Spine Complete  08/02/2012  *RADIOLOGY REPORT*  Clinical Data: Chronic low back pain and hip pain.  LUMBAR SPINE - COMPLETE 4+ VIEW  Comparison: No priors.  Findings: Multiple views of the lumbar spine demonstrate postoperative changes of PLIF and L2-L4 with an interbody graft at the L3-L4 interspace. There is also bone graft material posterior and lateral to the spine extending from L2-L5.  Alignment throughout this region is anatomic.  There is multilevel degenerative disc disease, most pronounced in the lower thoracic spine at T10-T11 and T11-T12. Numerous vascular calcifications are noted.  Numerous pelvic phleboliths.  A 9 mm calcification projecting over the expected location of the lower pole of the left kidney likely represents a renal calculus.  IMPRESSION: 1.  Multilevel degenerative disc disease and lumbar spondylosis status post PLIF from L2-L4, as above. 2.  No acute findings in the lumbar spine to account for the patient's symptoms. 3.  9 mm calcification projecting over the lower pole of the left kidney likely represents a renal calculus.  This was present on prior CT scan 01/09/2012.   Original Report Authenticated By: Florencia Reasons, M.D.    Dg Pelvis 1-2 Views  08/02/2012  *RADIOLOGY REPORT*   Clinical Data: Hip pain.  Back pain.  PELVIS - 1-2 VIEW  Comparison: No priors.  Findings: Single AP view of the bony pelvis demonstrates no acute displaced fractures of the bony pelvic ring.  Bilateral hip joint osteoarthritis (right greater than left) is noted, moderate on the right and very mild on the left.  Numerous pelvic phleboliths are noted.  Postoperative changes of PLIF are incompletely visualized in the lower lumbar spine.  IMPRESSION: 1.  No acute radiographic abnormality of the bony pelvis. 2.  Moderate right and mild left hip joint osteoarthritis.   Original Report Authenticated By: Florencia Reasons, M.D.      1. Back pain   2. Arthritis       MDM  [name] is a 78 y.o. male here with back pain and L hip pain. His xray showed arthritis but no fracture. His pain improved with percocet. I think he may have pain secondary to lumbar disk disease vs arthritis of hips. I recommend pain meds and ortho f/u. Return precautions given.   This document was completed by the scribe at my direction and I have reviewed its accuracy. I have personally examined the patient and agrees with the above document.   Chaney Malling, MD      Richardean Canal, MD 08/02/12 [phone]

## 2012-08-02 NOTE — ED Notes (Signed)
Pt c/o L hip pain since the summer. Reports PCP evaluated him for same and was given vicodin with no relief. States he is having difficulty standing now because pain is so bad. Denies any injuries. Reports hx back pain and surgery

## 2012-11-05 ENCOUNTER — Emergency Department (HOSPITAL_COMMUNITY): Payer: Medicare Other

## 2012-11-05 ENCOUNTER — Emergency Department (HOSPITAL_COMMUNITY)
Admission: EM | Admit: 2012-11-05 | Discharge: 2012-11-05 | Disposition: A | Discharge status: Home / Self Care | Payer: Medicare Other | Attending: Emergency Medicine | Admitting: Emergency Medicine

## 2012-11-05 ENCOUNTER — Encounter (HOSPITAL_COMMUNITY): Payer: Self-pay | Admitting: Family Medicine

## 2012-11-05 DIAGNOSIS — I252 Old myocardial infarction: Secondary | ICD-10-CM | POA: Insufficient documentation

## 2012-11-05 DIAGNOSIS — J45909 Unspecified asthma, uncomplicated: Secondary | ICD-10-CM | POA: Insufficient documentation

## 2012-11-05 DIAGNOSIS — M542 Cervicalgia: Secondary | ICD-10-CM | POA: Insufficient documentation

## 2012-11-05 DIAGNOSIS — R079 Chest pain, unspecified: Secondary | ICD-10-CM | POA: Insufficient documentation

## 2012-11-05 DIAGNOSIS — Z7982 Long term (current) use of aspirin: Secondary | ICD-10-CM | POA: Insufficient documentation

## 2012-11-05 DIAGNOSIS — I2510 Atherosclerotic heart disease of native coronary artery without angina pectoris: Secondary | ICD-10-CM | POA: Insufficient documentation

## 2012-11-05 DIAGNOSIS — M549 Dorsalgia, unspecified: Secondary | ICD-10-CM | POA: Insufficient documentation

## 2012-11-05 DIAGNOSIS — K219 Gastro-esophageal reflux disease without esophagitis: Secondary | ICD-10-CM | POA: Insufficient documentation

## 2012-11-05 DIAGNOSIS — I10 Essential (primary) hypertension: Secondary | ICD-10-CM | POA: Insufficient documentation

## 2012-11-05 DIAGNOSIS — R209 Unspecified disturbances of skin sensation: Secondary | ICD-10-CM | POA: Insufficient documentation

## 2012-11-05 DIAGNOSIS — E785 Hyperlipidemia, unspecified: Secondary | ICD-10-CM | POA: Insufficient documentation

## 2012-11-05 DIAGNOSIS — G8929 Other chronic pain: Secondary | ICD-10-CM | POA: Insufficient documentation

## 2012-11-05 DIAGNOSIS — Z79899 Other long term (current) drug therapy: Secondary | ICD-10-CM | POA: Insufficient documentation

## 2012-11-05 DIAGNOSIS — Z9889 Other specified postprocedural states: Secondary | ICD-10-CM | POA: Insufficient documentation

## 2012-11-05 DIAGNOSIS — R0989 Other specified symptoms and signs involving the circulatory and respiratory systems: Secondary | ICD-10-CM | POA: Insufficient documentation

## 2012-11-05 DIAGNOSIS — R0609 Other forms of dyspnea: Secondary | ICD-10-CM | POA: Insufficient documentation

## 2012-11-05 MED ORDER — DIAZEPAM 5 MG PO TABS
5.0000 mg | ORAL_TABLET | Freq: Once | ORAL | Status: AC
  Administered 2012-11-05: 5 mg via ORAL
  Filled 2012-11-05: qty 1

## 2012-11-05 MED ORDER — HYDROCODONE-ACETAMINOPHEN 5-325 MG PO TABS: 2.0000 | ORAL_TABLET | ORAL | Status: DC | PRN

## 2012-11-05 NOTE — ED Notes (Signed)
MD Belfi at bedside. 

## 2012-11-05 NOTE — ED Notes (Addendum)
Per pt sts last week started having lower back pain then it slowly radiated up his back to his neck. sts now the pain is in his neck and head. sts he feels like his head is going to explode

## 2012-11-05 NOTE — ED Provider Notes (Signed)
History     CSN: [id number]  Arrival date & time 11/05/12  1115   First MD Initiated Contact with Patient 11/05/12 1148      Chief Complaint  Patient presents with  . Neck Pain    (Consider location/radiation/quality/duration/timing/severity/associated sxs/prior treatment) HPI Comments: Patient complains of a three-day history of worsening pain to his neck. He states he woke up with it one morning. He states it's a sharp pain in the right side of his neck it radiates to his right shoulder. He has no numbness or weakness in the right arm, other than some baseline numbness to his fingers that is unchanged from baseline.Marland Kitchen He does have a history of cervical disease and has had 3 surgeries on his neck including effusion. These were done by Dr. Danielle Dess who is his neurosurgeon. He states his last surgery was 3-4 years ago. He did take some Vicodin at home without relief of the pain. He states it's worse if he turns his head to the side or wheeze trying to lift anything. He denies any chest pain or shortness of breath. He denies any fevers or recent illnesses. He denies any dizziness or headache other than that times the pain shoots up into his head. He denies any recent trauma.  Patient is a 79 y.o. male presenting with neck pain.  Neck Pain  Pertinent negatives include no chest pain, no numbness, no headaches and no weakness.    Past Medical History  Diagnosis Date  . Hypertension   . Hyperlipidemia   . Chronic chest pain   . Dyspnea     chronic  . GERD (gastroesophageal reflux disease)     h/o esophageal spasm  . Asthma   . Back pain   . Coronary artery disease     a. s/p BMS to RCA in 2002; b. s/p cutting balloon POBA ;   c. cath 6/12: oDx 80% (treated with repeat cutting balloon POBA), mLAD 50% with 30-40% at Dx, CFX 30%, pRCA 25% with patent stents  . Myocardial infarction     Past Surgical History  Procedure Date  . Cardiac catheterization   . Neck surgery     multiple  .  Back surgery     multiple  . Laminectomy   . Posterior laminectomy / decompression cervical spine   . Anterior cervical decomp/discectomy fusion   . Coronary angioplasty with stent placement      previous percutaneous intervention on the  RCA and the diagonal branch  . Esophagogastroduodenoscopy 03/23/2012    Procedure: ESOPHAGOGASTRODUODENOSCOPY (EGD);  Surgeon: Vertell Novak., MD;  Location: [facility];  Service: Endoscopy;  Laterality: N/A;  . Savory dilation 03/23/2012    Procedure: SAVORY DILATION;  Surgeon: Vertell Novak., MD;  Location: [facility];  Service: Endoscopy;  Laterality: N/A;  . Tonsillectomy   . Eye surgery     Family History  Problem Relation Age of Onset  . Diabetes Father   . Heart disease Father   . Asthma Father   . Heart disease Mother     CABG hx age 89  . Colon cancer Son     hx   . Colitis Son     hx  . Crohn's disease Son     History  Substance Use Topics  . Smoking status: Never Smoker   . Smokeless tobacco: Not on file  . Alcohol Use: No      Review of Systems  Constitutional: Negative for fever, chills, diaphoresis and  fatigue.  HENT: Positive for neck pain. Negative for congestion, rhinorrhea and sneezing.   Eyes: Negative.   Respiratory: Negative for cough, chest tightness and shortness of breath.   Cardiovascular: Negative for chest pain and leg swelling.  Gastrointestinal: Negative for nausea, vomiting, abdominal pain, diarrhea and blood in stool.  Genitourinary: Negative for frequency, hematuria, flank pain and difficulty urinating.  Musculoskeletal: Positive for back pain (pain in his mid back earlier in the week, but none now). Negative for arthralgias.  Skin: Negative for rash.  Neurological: Negative for dizziness, speech difficulty, weakness, numbness and headaches.    Allergies  Iohexol; Budesonide-formoterol fumarate; Iodinated diagnostic agents; Meperidine hcl; Naproxen; Simvastatin; Sulfonamide derivatives;  and Pregabalin  Home Medications   Current Outpatient Rx  Name  Route  Sig  Dispense  Refill  . AMLODIPINE BESYLATE 5 MG PO TABS   Oral   Take 1 tablet (5 mg total) by mouth daily.   30 tablet   3   . ASPIRIN EC 81 MG PO TBEC   Oral   Take 81 mg by mouth daily.         . ATORVASTATIN CALCIUM 40 MG PO TABS   Oral   Take 40 mg by mouth daily.         Marland Kitchen VITAMIN D 1000 UNITS PO TABS   Oral   Take 1,000 Units by mouth daily.         Marland Kitchen CLOPIDOGREL BISULFATE 75 MG PO TABS   Oral   Take 75 mg by mouth daily.         . COLESTIPOL HCL 1 G PO TABS   Oral   Take 1 g by mouth 2 (two) times daily as needed. For diarrhea         . DOXAZOSIN MESYLATE 4 MG PO TABS   Oral   Take 1 tablet (4 mg total) by mouth at bedtime.   30 tablet   6   . FUROSEMIDE 40 MG PO TABS   Oral   Take 40 mg by mouth daily as needed. For fluid retension         . ISOSORBIDE MONONITRATE ER 60 MG PO TB24   Oral   Take 1 tablet (60 mg total) by mouth daily.   30 tablet   6   . METOPROLOL TARTRATE 25 MG PO TABS   Oral   Take 25 mg by mouth 2 (two) times daily.          Marland Kitchen NITROGLYCERIN 0.4 MG SL SUBL   Sublingual   Place 1 tablet (0.4 mg total) under the tongue every 5 (five) minutes as needed.   25 tablet   6   . PANTOPRAZOLE SODIUM 40 MG PO TBEC   Oral   Take 1 tablet (40 mg total) by mouth 2 (two) times daily.   60 tablet   6   . POTASSIUM CHLORIDE CRYS ER 10 MEQ PO TBCR   Oral   Take 10 mEq by mouth daily.         Marland Kitchen TAMSULOSIN HCL 0.4 MG PO CAPS   Oral   Take 0.4 mg by mouth daily.         Marland Kitchen VITAMIN B-12 1000 MCG PO TABS   Oral   Take 1,000 mcg by mouth daily.         . ALBUTEROL SULFATE HFA 108 (90 BASE) MCG/ACT IN AERS   Inhalation   Inhale 2 puffs into the lungs every 6 (six)  hours as needed. For shortness of breath         . FLUTICASONE PROPIONATE  HFA 220 MCG/ACT IN AERO   Inhalation   Inhale 1 puff into the lungs 2 (two) times daily as needed. For  shortness of breath         . HYDROCODONE-ACETAMINOPHEN 5-325 MG PO TABS   Oral   Take 2 tablets by mouth every 4 (four) hours as needed for pain.   20 tablet   0     BP 163/74  Pulse 94  Temp 98.8 F (37.1 C)  Resp 18  SpO2 96%  Physical Exam  Constitutional: He is oriented to person, place, and time. He appears well-developed and well-nourished.  HENT:  Head: Normocephalic and atraumatic.  Eyes: Pupils are equal, round, and reactive to light.  Neck:       TTP right cervical paraspinal muscles and over right trapezius muscle  Cardiovascular: Normal rate, regular rhythm and normal heart sounds.   Pulmonary/Chest: Effort normal and breath sounds normal. No respiratory distress. He has no wheezes. He has no rales. He exhibits no tenderness.  Abdominal: Soft. Bowel sounds are normal. There is no tenderness. There is no rebound and no guarding.  Musculoskeletal: Normal range of motion. He exhibits no edema.       No pain to thoracic or LS spine  Lymphadenopathy:    He has no cervical adenopathy.  Neurological: He is alert and oriented to person, place, and time. He has normal strength. No sensory deficit. GCS eye subscore is 4. GCS verbal subscore is 5. GCS motor subscore is 6.  Skin: Skin is warm and dry. No rash noted.  Psychiatric: He has a normal mood and affect.    ED Course  Procedures (including critical care time)  Results for orders placed during the hospital encounter of 04/14/12  CBC WITH DIFFERENTIAL      Component Value Range   WBC 5.4  4.0 - 10.5 K/uL   RBC 4.31  4.22 - 5.81 MIL/uL   Hemoglobin 13.2  13.0 - 17.0 g/dL   HCT 47.8 (*) 29.5 - 62.1 %   MCV 89.8  78.0 - 100.0 fL   MCH 30.6  26.0 - 34.0 pg   MCHC 34.1  30.0 - 36.0 g/dL   RDW 30.8  65.7 - 84.6 %   Platelets 212  150 - 400 K/uL   Neutrophils Relative 53  43 - 77 %   Neutro Abs 2.9  1.7 - 7.7 K/uL   Lymphocytes Relative 32  12 - 46 %   Lymphs Abs 1.7  0.7 - 4.0 K/uL   Monocytes Relative 8  3 -  12 %   Monocytes Absolute 0.4  0.1 - 1.0 K/uL   Eosinophils Relative 6 (*) 0 - 5 %   Eosinophils Absolute 0.3  0.0 - 0.7 K/uL   Basophils Relative 1  0 - 1 %   Basophils Absolute 0.1  0.0 - 0.1 K/uL  COMPREHENSIVE METABOLIC PANEL      Component Value Range   Sodium 141  135 - 145 mEq/L   Potassium 4.0  3.5 - 5.1 mEq/L   Chloride 104  96 - 112 mEq/L   CO2 29  19 - 32 mEq/L   Glucose, Bld 104 (*) 70 - 99 mg/dL   BUN 8  6 - 23 mg/dL   Creatinine, Ser 9.62  0.50 - 1.35 mg/dL   Calcium 9.3  8.4 - 95.2 mg/dL  Total Protein 6.9  6.0 - 8.3 g/dL   Albumin 3.5  3.5 - 5.2 g/dL   AST 17  0 - 37 U/L   ALT 12  0 - 53 U/L   Alkaline Phosphatase 91  39 - 117 U/L   Total Bilirubin 0.5  0.3 - 1.2 mg/dL   GFR calc non Af Amer 83 (*) >90 mL/min   GFR calc Af Amer >90  >90 mL/min  CARDIAC PANEL(CRET KIN+CKTOT+MB+TROPI)      Component Value Range   Total CK 103  7 - 232 U/L   CK, MB 3.7  0.3 - 4.0 ng/mL   Troponin I <0.30  <0.30 ng/mL   Relative Index 3.6 (*) 0.0 - 2.5  POCT I-STAT TROPONIN I      Component Value Range   Troponin i, poc 0.00  0.00 - 0.08 ng/mL   Comment 3           CARDIAC PANEL(CRET KIN+CKTOT+MB+TROPI)      Component Value Range   Total CK 93  7 - 232 U/L   CK, MB 3.6  0.3 - 4.0 ng/mL   Troponin I <0.30  <0.30 ng/mL   Relative Index RELATIVE INDEX IS INVALID  0.0 - 2.5  CARDIAC PANEL(CRET KIN+CKTOT+MB+TROPI)      Component Value Range   Total CK 86  7 - 232 U/L   CK, MB 3.5  0.3 - 4.0 ng/mL   Troponin I <0.30  <0.30 ng/mL   Relative Index RELATIVE INDEX IS INVALID  0.0 - 2.5  CARDIAC PANEL(CRET KIN+CKTOT+MB+TROPI)      Component Value Range   Total CK 75  7 - 232 U/L   CK, MB 3.2  0.3 - 4.0 ng/mL   Troponin I <0.30  <0.30 ng/mL   Relative Index RELATIVE INDEX IS INVALID  0.0 - 2.5  PROTIME-INR      Component Value Range   Prothrombin Time 14.6  11.6 - 15.2 seconds   INR 1.12  0.00 - 1.49  APTT      Component Value Range   aPTT 34  24 - 37 seconds  TSH       Component Value Range   TSH 1.469  0.350 - 4.500 uIU/mL  CBC      Component Value Range   WBC 5.1  4.0 - 10.5 K/uL   RBC 4.06 (*) 4.22 - 5.81 MIL/uL   Hemoglobin 12.4 (*) 13.0 - 17.0 g/dL   HCT 40.3 (*) 47.4 - 25.9 %   MCV 89.9  78.0 - 100.0 fL   MCH 30.5  26.0 - 34.0 pg   MCHC 34.0  30.0 - 36.0 g/dL   RDW 56.3  87.5 - 64.3 %   Platelets 197  150 - 400 K/uL  DIFFERENTIAL      Component Value Range   Neutrophils Relative 50  43 - 77 %   Neutro Abs 2.5  1.7 - 7.7 K/uL   Lymphocytes Relative 36  12 - 46 %   Lymphs Abs 1.8  0.7 - 4.0 K/uL   Monocytes Relative 8  3 - 12 %   Monocytes Absolute 0.4  0.1 - 1.0 K/uL   Eosinophils Relative 6 (*) 0 - 5 %   Eosinophils Absolute 0.3  0.0 - 0.7 K/uL   Basophils Relative 1  0 - 1 %   Basophils Absolute 0.1  0.0 - 0.1 K/uL  COMPREHENSIVE METABOLIC PANEL      Component Value Range  Sodium 142  135 - 145 mEq/L   Potassium 4.1  3.5 - 5.1 mEq/L   Chloride 106  96 - 112 mEq/L   CO2 29  19 - 32 mEq/L   Glucose, Bld 96  70 - 99 mg/dL   BUN 7  6 - 23 mg/dL   Creatinine, Ser 4.09  0.50 - 1.35 mg/dL   Calcium 9.0  8.4 - 81.1 mg/dL   Total Protein 6.3  6.0 - 8.3 g/dL   Albumin 3.3 (*) 3.5 - 5.2 g/dL   AST 16  0 - 37 U/L   ALT 10  0 - 53 U/L   Alkaline Phosphatase 84  39 - 117 U/L   Total Bilirubin 0.5  0.3 - 1.2 mg/dL   GFR calc non Af Amer 85 (*) >90 mL/min   GFR calc Af Amer >90  >90 mL/min  HEPARIN LEVEL (UNFRACTIONATED)      Component Value Range   Heparin Unfractionated 0.35  0.30 - 0.70 IU/mL  HEPARIN LEVEL (UNFRACTIONATED)      Component Value Range   Heparin Unfractionated 0.12 (*) 0.30 - 0.70 IU/mL  CBC      Component Value Range   WBC 7.1  4.0 - 10.5 K/uL   RBC 4.42  4.22 - 5.81 MIL/uL   Hemoglobin 13.4  13.0 - 17.0 g/dL   HCT 91.4  78.2 - 95.6 %   MCV 89.4  78.0 - 100.0 fL   MCH 30.3  26.0 - 34.0 pg   MCHC 33.9  30.0 - 36.0 g/dL   RDW 21.3  08.6 - 57.8 %   Platelets 224  150 - 400 K/uL   Ct Cervical Spine Wo  Contrast  11/05/2012  *RADIOLOGY REPORT*  Clinical Data: Right side neck pain and tenderness, prior cervical spine surgery  CT CERVICAL SPINE WITHOUT CONTRAST  Technique:  Multidetector CT imaging of the cervical spine was performed. Multiplanar CT image reconstructions were also generated.  Comparison: 08/17/2007 cervical spine radiographs  Findings: Visualized skull base intact. Osseous mineralization grossly normal. Prior anterior cervical spine fusion C3-C7 appears unchanged. Prevertebral soft tissues normal thickness. AP narrowing of the spinal canal posterior to the odontoid process 11 mm diameter secondary to calcified pannus posterior to the odontoid. Facet degenerative changes C2-C3 greater on the right. Disc space narrowing C7-T1 less C2-C3 with minimal end plate spur formation. No acute fracture, subluxation or bone destruction. No significant soft tissue abnormalities. Tips of lung apices clear.  IMPRESSION: Prior C3-C7 fusion which appears intact. Mild degenerative changes at C2-C3 and C7-T1 as above. AP narrowing of the spinal canal posterior to odontoid process due to chronic calcified pannus, residual AP diameter 11 mm. No acute cervical spine abnormalities identified.   Original Report Authenticated By: Ulyses Southward, M.D.       1. Neck pain       MDM  Pt with no spinal mass/acute appearing injury. No neuro deficits.  Pt feeling much better with a dose of valium, although he does not want this as an oupt.  Wants to use hydrocodone that he used with his prior neck problems.        Rolan Bucco, MD 11/06/12 [phone]

## 2012-12-24 ENCOUNTER — Other Ambulatory Visit: Payer: Self-pay | Admitting: Neurological Surgery

## 2012-12-24 DIAGNOSIS — M4802 Spinal stenosis, cervical region: Secondary | ICD-10-CM

## 2012-12-31 ENCOUNTER — Ambulatory Visit
Admission: RE | Admit: 2012-12-31 | Discharge: 2012-12-31 | Disposition: A | Discharge status: Home / Self Care | Payer: Medicare Other | Source: Ambulatory Visit | Attending: Neurological Surgery | Admitting: Neurological Surgery

## 2013-01-04 ENCOUNTER — Other Ambulatory Visit: Payer: Self-pay | Admitting: *Deleted

## 2013-01-04 DIAGNOSIS — I2510 Atherosclerotic heart disease of native coronary artery without angina pectoris: Secondary | ICD-10-CM

## 2013-01-04 NOTE — Telephone Encounter (Signed)
Patient has follow up Monday. Will give nurse rx request. Potassium Nitro .4mg  Plavix 75mg  Amlodipine 5mg  Doxazosin 4mg    Micki Riley, CMA

## 2013-01-07 ENCOUNTER — Ambulatory Visit (INDEPENDENT_AMBULATORY_CARE_PROVIDER_SITE_OTHER): Payer: Medicare Other | Admitting: Internal Medicine

## 2013-01-07 VITALS — BP 143/76 | HR 70 | Ht 70.0 in | Wt 177.1 lb

## 2013-01-07 DIAGNOSIS — I10 Essential (primary) hypertension: Secondary | ICD-10-CM

## 2013-01-07 DIAGNOSIS — R351 Nocturia: Secondary | ICD-10-CM

## 2013-01-07 DIAGNOSIS — I2510 Atherosclerotic heart disease of native coronary artery without angina pectoris: Secondary | ICD-10-CM

## 2013-01-07 DIAGNOSIS — E785 Hyperlipidemia, unspecified: Secondary | ICD-10-CM

## 2013-01-07 MED ORDER — AMLODIPINE BESYLATE 5 MG PO TABS: 5.0000 mg | ORAL_TABLET | Freq: Every day | ORAL | Status: DC

## 2013-01-07 MED ORDER — DOXAZOSIN MESYLATE 4 MG PO TABS: 4.0000 mg | ORAL_TABLET | Freq: Every day | ORAL | Status: DC

## 2013-01-07 MED ORDER — NITROGLYCERIN 0.4 MG SL SUBL: 0.4000 mg | SUBLINGUAL_TABLET | SUBLINGUAL | Status: DC | PRN

## 2013-01-07 MED ORDER — METOPROLOL TARTRATE 25 MG PO TABS: 25.0000 mg | ORAL_TABLET | Freq: Two times a day (BID) | ORAL | Status: DC

## 2013-01-07 MED ORDER — ISOSORBIDE MONONITRATE ER 60 MG PO TB24: 60.0000 mg | ORAL_TABLET | Freq: Every day | ORAL | Status: DC

## 2013-01-07 MED ORDER — POTASSIUM CHLORIDE CRYS ER 10 MEQ PO TBCR: 10.0000 meq | EXTENDED_RELEASE_TABLET | Freq: Every day | ORAL | Status: DC

## 2013-01-07 MED ORDER — CLOPIDOGREL BISULFATE 75 MG PO TABS: 75.0000 mg | ORAL_TABLET | Freq: Every day | ORAL | Status: DC

## 2013-01-07 MED ORDER — PANTOPRAZOLE SODIUM 40 MG PO TBEC: 40.0000 mg | DELAYED_RELEASE_TABLET | Freq: Two times a day (BID) | ORAL | Status: DC

## 2013-01-07 NOTE — Patient Instructions (Addendum)
LABS TODAY:  CBC, BMET, LIPIDS, TSH, PSA  Your physician wants you to follow-up in: 6 months with Dr. Tenny Craw. You will receive a reminder letter in the mail two months in advance. If you don't receive a letter, please call our office to schedule the follow-up appointment.

## 2013-01-07 NOTE — Progress Notes (Signed)
HPI He has a h/o CAD, s/p stent to the RCA in the past as well as cutting balloon angioplasty to the Dx. He has chronic chest pain and was admitted for recurrent symptoms 03/2011. LHC demonstrated oDx 80%, mLAD 50% with 30-40% at the level of the Dx, CFX 30%, pRCA 25% with patent stent. He had cutting balloon PTCA done again to the Dx. Dr. Excell Seltzer did his intervention and he felt that the patient should be continued on dual antiplatelet therapy and aggressive medical management. He noted that the patient has had chronic chest pain and many catheterization procedures. If he has recurrent chest pain, he should have a nuclear stress test. Dr. Excell Seltzer did not think there was much more that could be safely done with his diagonal ostium without jeopardizing the LAD. He did feel that significant ischemia should be proven before considering repeat PCI. He was admitted with chest pain in 06/2011. Myoview 06/26/11: No ischemia or scar, EF 48%. Marland Kitchen  He was admitted 6/29-7/1 with chest pain. He ruled out for myocardial infarction by enzymes. He was noted to be bradycardic and his beta blocker was adjusted. Amlodipine was added  I saw him in [facility] in Sept 2013. Since then he has done well from a cardiac standpoint.  Denies CP  No change in his breathing. Biggest complaint is neck pain.  Allergies  Allergen Reactions  . Iohexol Shortness Of Breath and Itching    Pt was given 100cc of Omnipaque 300 followed by itching/ dyspnea.  . Budesonide-Formoterol Fumarate     REACTION: Mouth and tongue swelling  . Iodinated Diagnostic Agents     Iodine pt had a reaction when he had a CT DONE  . Meperidine Hcl     Hallucinations  . Naproxen Swelling  . Simvastatin   . Sulfonamide Derivatives   . Pregabalin Rash    Current Outpatient Prescriptions  Medication Sig Dispense Refill  . albuterol (PROVENTIL HFA;VENTOLIN HFA) 108 (90 BASE) MCG/ACT inhaler Inhale 2 puffs into the lungs every 6 (six) hours as needed. For shortness of  breath      . amLODipine (NORVASC) 5 MG tablet Take 1 tablet (5 mg total) by mouth daily.  90 tablet  3  . aspirin EC 81 MG tablet Take 81 mg by mouth daily.      Marland Kitchen atorvastatin (LIPITOR) 40 MG tablet Take 40 mg by mouth daily.      . cholecalciferol (VITAMIN D) 1000 UNITS tablet Take 1,000 Units by mouth daily.      . clopidogrel (PLAVIX) 75 MG tablet Take 1 tablet (75 mg total) by mouth daily.  90 tablet  3  . colestipol (COLESTID) 1 G tablet Take 1 g by mouth 2 (two) times daily as needed. For diarrhea      . doxazosin (CARDURA) 4 MG tablet Take 1 tablet (4 mg total) by mouth at bedtime.  90 tablet  3  . fluticasone (FLOVENT HFA) 220 MCG/ACT inhaler Inhale 1 puff into the lungs 2 (two) times daily as needed. For shortness of breath      . furosemide (LASIX) 40 MG tablet Take 40 mg by mouth daily as needed. For fluid retension      . HYDROcodone-acetaminophen (NORCO/VICODIN) 5-325 MG per tablet Take 2 tablets by mouth every 4 (four) hours as needed for pain.  20 tablet  0  . isosorbide mononitrate (IMDUR) 60 MG 24 hr tablet Take 1 tablet (60 mg total) by mouth daily.  90 tablet  3  .  metoprolol tartrate (LOPRESSOR) 25 MG tablet Take 1 tablet (25 mg total) by mouth 2 (two) times daily.  180 tablet  3  . nitroGLYCERIN (NITROSTAT) 0.4 MG SL tablet Place 1 tablet (0.4 mg total) under the tongue every 5 (five) minutes as needed.  25 tablet  6  . pantoprazole (PROTONIX) 40 MG tablet Take 1 tablet (40 mg total) by mouth 2 (two) times daily.  180 tablet  3  . potassium chloride (K-DUR,KLOR-CON) 10 MEQ tablet Take 1 tablet (10 mEq total) by mouth daily.  90 tablet  3  . Tamsulosin HCl (FLOMAX) 0.4 MG CAPS Take 0.4 mg by mouth daily.      . vitamin B-12 (CYANOCOBALAMIN) 1000 MCG tablet Take 1,000 mcg by mouth daily.       No current facility-administered medications for this visit.    Past Medical History  Diagnosis Date  . Hypertension   . Hyperlipidemia   . Chronic chest pain   . Dyspnea      chronic  . GERD (gastroesophageal reflux disease)     h/o esophageal spasm  . Asthma   . Back pain   . Coronary artery disease     a. s/p BMS to RCA in 2002; b. s/p cutting balloon POBA ;   c. cath 6/12: oDx 80% (treated with repeat cutting balloon POBA), mLAD 50% with 30-40% at Dx, CFX 30%, pRCA 25% with patent stents  . Myocardial infarction     Past Surgical History  Procedure Laterality Date  . Cardiac catheterization    . Neck surgery      multiple  . Back surgery      multiple  . Laminectomy    . Posterior laminectomy / decompression cervical spine    . Anterior cervical decomp/discectomy fusion    . Coronary angioplasty with stent placement       previous percutaneous intervention on the  RCA and the diagonal branch  . Esophagogastroduodenoscopy  03/23/2012    Procedure: ESOPHAGOGASTRODUODENOSCOPY (EGD);  Surgeon: Vertell Novak., MD;  Location: [facility];  Service: Endoscopy;  Laterality: N/A;  . Savory dilation  03/23/2012    Procedure: SAVORY DILATION;  Surgeon: Vertell Novak., MD;  Location: [facility];  Service: Endoscopy;  Laterality: N/A;  . Tonsillectomy    . Eye surgery      Family History  Problem Relation Age of Onset  . Diabetes Father   . Heart disease Father   . Asthma Father   . Heart disease Mother     CABG hx age 64  . Colon cancer Son     hx   . Colitis Son     hx  . Crohn's disease Son     History   Social History  . Marital Status: Married    Spouse Name: N/A    Number of Children: N/A  . Years of Education: N/A   Occupational History  . retired     Forensic scientist   Social History Main Topics  . Smoking status: Never Smoker   . Smokeless tobacco: Not on file  . Alcohol Use: No  . Drug Use: No  . Sexually Active: No   Other Topics Concern  . Not on file   Social History Narrative  . No narrative on file    Review of Systems:  All systems reviewed.  They are negative to the above problem except as previously  stated.  Vital Signs: There were no vitals taken for this  visit.  Physical Exam  HEENT:  Normocephalic, atraumatic. EOMI, PERRLA.  Neck: JVP is normal.  No bruits.  Lungs: clear to auscultation. No rales no wheezes.  Heart: Regular rate and rhythm. Normal S1, S2. No S3.   No significant murmurs. PMI not displaced.  Abdomen:  Supple, nontender. Normal bowel sounds. No masses. No hepatomegaly.  Extremities:   Good distal pulses throughout. No lower extremity edema.  Musculoskeletal :moving all extremities.  Neuro:   alert and oriented x3.  CN II-XII grossly intact.  EKG  SR 63 bpm. Assessment and Plan:  1.  CAD  Doing very well.  I would keep on same regimen  2.  HL  Will check fasting lipids  He did not eat today. Will check other labs as well (HCM)

## 2013-01-08 LAB — CBC WITH DIFFERENTIAL/PLATELET
Basophils Relative: 0.5 % (ref 0.0–3.0)
Eosinophils Absolute: 0.2 10*3/uL (ref 0.0–0.7)
Lymphs Abs: 1.8 10*3/uL (ref 0.7–4.0)
MCHC: 33.5 g/dL (ref 30.0–36.0)
MCV: 88.7 fl (ref 78.0–100.0)
Monocytes Absolute: 0.5 10*3/uL (ref 0.1–1.0)
Neutrophils Relative %: 54.5 % (ref 43.0–77.0)
Platelets: 192 10*3/uL (ref 150.0–400.0)
RBC: 4.07 Mil/uL — ABNORMAL LOW (ref 4.22–5.81)

## 2013-01-08 LAB — BASIC METABOLIC PANEL
CO2: 29 mEq/L (ref 19–32)
Chloride: 106 mEq/L (ref 96–112)
Creatinine, Ser: 0.9 mg/dL (ref 0.4–1.5)
Potassium: 3.8 mEq/L (ref 3.5–5.1)

## 2013-01-08 LAB — LIPID PANEL
Total CHOL/HDL Ratio: 3
Triglycerides: 60 mg/dL (ref 0.0–149.0)

## 2013-01-08 LAB — PSA: PSA: 5.73 ng/mL — ABNORMAL HIGH (ref 0.10–4.00)

## 2013-01-18 ENCOUNTER — Emergency Department (HOSPITAL_COMMUNITY): Payer: Medicare Other

## 2013-01-18 ENCOUNTER — Observation Stay (HOSPITAL_COMMUNITY)
Admission: EM | Admit: 2013-01-18 | Discharge: 2013-01-19 | Disposition: A | Discharge status: Home / Self Care | Payer: Medicare Other | Attending: Cardiology | Admitting: Cardiology

## 2013-01-18 ENCOUNTER — Encounter (HOSPITAL_COMMUNITY): Payer: Self-pay | Admitting: *Deleted

## 2013-01-18 DIAGNOSIS — E785 Hyperlipidemia, unspecified: Secondary | ICD-10-CM | POA: Insufficient documentation

## 2013-01-18 DIAGNOSIS — I252 Old myocardial infarction: Secondary | ICD-10-CM | POA: Insufficient documentation

## 2013-01-18 DIAGNOSIS — I10 Essential (primary) hypertension: Secondary | ICD-10-CM | POA: Diagnosis present

## 2013-01-18 DIAGNOSIS — Z9861 Coronary angioplasty status: Secondary | ICD-10-CM | POA: Insufficient documentation

## 2013-01-18 DIAGNOSIS — R0789 Other chest pain: Secondary | ICD-10-CM | POA: Diagnosis present

## 2013-01-18 DIAGNOSIS — K219 Gastro-esophageal reflux disease without esophagitis: Secondary | ICD-10-CM | POA: Diagnosis present

## 2013-01-18 DIAGNOSIS — R079 Chest pain, unspecified: Principal | ICD-10-CM

## 2013-01-18 DIAGNOSIS — I2510 Atherosclerotic heart disease of native coronary artery without angina pectoris: Secondary | ICD-10-CM | POA: Insufficient documentation

## 2013-01-18 DIAGNOSIS — R0602 Shortness of breath: Secondary | ICD-10-CM | POA: Insufficient documentation

## 2013-01-18 DIAGNOSIS — R110 Nausea: Secondary | ICD-10-CM | POA: Insufficient documentation

## 2013-01-18 LAB — COMPREHENSIVE METABOLIC PANEL
ALT: 13 U/L (ref 0–53)
Alkaline Phosphatase: 76 U/L (ref 39–117)
BUN: 9 mg/dL (ref 6–23)
CO2: 29 mEq/L (ref 19–32)
Chloride: 105 mEq/L (ref 96–112)
GFR calc Af Amer: 88 mL/min — ABNORMAL LOW (ref >90)
GFR calc non Af Amer: 76 mL/min — ABNORMAL LOW (ref >90)
Glucose, Bld: 107 mg/dL — ABNORMAL HIGH (ref 70–99)
Potassium: 4.1 mEq/L (ref 3.5–5.1)
Total Bilirubin: 0.5 mg/dL (ref 0.3–1.2)

## 2013-01-18 LAB — POCT I-STAT TROPONIN I
Troponin i, poc: 0 ng/mL (ref 0.00–0.08)
Troponin i, poc: 0.02 ng/mL (ref 0.00–0.08)

## 2013-01-18 LAB — CBC
HCT: 36.5 % — ABNORMAL LOW (ref 39.0–52.0)
HCT: 35.7 % — ABNORMAL LOW (ref 39.0–52.0)
Hemoglobin: 12.4 g/dL — ABNORMAL LOW (ref 13.0–17.0)
Hemoglobin: 12.2 g/dL — ABNORMAL LOW (ref 13.0–17.0)
MCHC: 34 g/dL (ref 30.0–36.0)
MCV: 86.2 fL (ref 78.0–100.0)
RBC: 4.21 MIL/uL — ABNORMAL LOW (ref 4.22–5.81)
RBC: 4.14 MIL/uL — ABNORMAL LOW (ref 4.22–5.81)
WBC: 4.9 10*3/uL (ref 4.0–10.5)
WBC: 5.4 10*3/uL (ref 4.0–10.5)

## 2013-01-18 LAB — CREATININE, SERUM: GFR calc Af Amer: >90 mL/min (ref >90)

## 2013-01-18 MED ORDER — ONDANSETRON HCL 4 MG/2ML IJ SOLN: 4.0000 mg | Freq: Four times a day (QID) | INTRAMUSCULAR | Status: DC | PRN

## 2013-01-18 MED ORDER — ALBUTEROL SULFATE HFA 108 (90 BASE) MCG/ACT IN AERS
2.0000 | INHALATION_SPRAY | Freq: Four times a day (QID) | RESPIRATORY_TRACT | Status: DC | PRN
Start: 2013-01-18 — End: 2013-01-19

## 2013-01-18 MED ORDER — ACETAMINOPHEN 325 MG PO TABS: 650.0000 mg | ORAL_TABLET | ORAL | Status: DC | PRN

## 2013-01-18 MED ORDER — CLOPIDOGREL BISULFATE 75 MG PO TABS
75.0000 mg | ORAL_TABLET | Freq: Every day | ORAL | Status: DC
  Administered 2013-01-18 – 2013-01-19 (×2): 75 mg via ORAL
  Filled 2013-01-18 (×2): qty 1

## 2013-01-18 MED ORDER — COLESTIPOL HCL 1 G PO TABS
1.0000 g | ORAL_TABLET | Freq: Two times a day (BID) | ORAL | Status: DC | PRN
  Filled 2013-01-18: qty 1

## 2013-01-18 MED ORDER — ASPIRIN 81 MG PO CHEW
162.0000 mg | CHEWABLE_TABLET | Freq: Once | ORAL | Status: AC
  Administered 2013-01-18: 162 mg via ORAL
  Filled 2013-01-18: qty 2

## 2013-01-18 MED ORDER — ASPIRIN EC 81 MG PO TBEC
81.0000 mg | DELAYED_RELEASE_TABLET | Freq: Every day | ORAL | Status: DC
  Administered 2013-01-19: 81 mg via ORAL
  Filled 2013-01-18: qty 1

## 2013-01-18 MED ORDER — POTASSIUM CHLORIDE CRYS ER 10 MEQ PO TBCR
10.0000 meq | EXTENDED_RELEASE_TABLET | Freq: Every day | ORAL | Status: DC
  Administered 2013-01-18 – 2013-01-19 (×2): 10 meq via ORAL
  Filled 2013-01-18: qty 2
  Filled 2013-01-18: qty 1

## 2013-01-18 MED ORDER — AMLODIPINE BESYLATE 5 MG PO TABS
5.0000 mg | ORAL_TABLET | Freq: Every day | ORAL | Status: DC
  Administered 2013-01-18 – 2013-01-19 (×2): 5 mg via ORAL
  Filled 2013-01-18 (×2): qty 1

## 2013-01-18 MED ORDER — DOXAZOSIN MESYLATE 4 MG PO TABS
4.0000 mg | ORAL_TABLET | Freq: Every day | ORAL | Status: DC
Start: 2013-01-18 — End: 2013-01-19
  Administered 2013-01-18: 4 mg via ORAL
  Filled 2013-01-18 (×2): qty 1

## 2013-01-18 MED ORDER — SODIUM CHLORIDE 0.9 % IJ SOLN: 3.0000 mL | INTRAMUSCULAR | Status: DC | PRN

## 2013-01-18 MED ORDER — SODIUM CHLORIDE 0.9 % IJ SOLN
3.0000 mL | Freq: Two times a day (BID) | INTRAMUSCULAR | Status: DC
  Administered 2013-01-19: 3 mL via INTRAVENOUS

## 2013-01-18 MED ORDER — NITROGLYCERIN 2 % TD OINT
1.0000 [in_us] | TOPICAL_OINTMENT | Freq: Once | TRANSDERMAL | Status: AC
  Administered 2013-01-18: 1 [in_us] via TOPICAL
  Filled 2013-01-18: qty 1

## 2013-01-18 MED ORDER — NITROGLYCERIN 0.4 MG SL SUBL: 0.4000 mg | SUBLINGUAL_TABLET | SUBLINGUAL | Status: DC | PRN

## 2013-01-18 MED ORDER — TAMSULOSIN HCL 0.4 MG PO CAPS
0.4000 mg | ORAL_CAPSULE | Freq: Every day | ORAL | Status: DC
  Administered 2013-01-18 – 2013-01-19 (×2): 0.4 mg via ORAL
  Filled 2013-01-18 (×2): qty 1

## 2013-01-18 MED ORDER — PANTOPRAZOLE SODIUM 40 MG PO TBEC
40.0000 mg | DELAYED_RELEASE_TABLET | Freq: Two times a day (BID) | ORAL | Status: DC
  Administered 2013-01-18 – 2013-01-19 (×2): 40 mg via ORAL
  Filled 2013-01-18 (×3): qty 1

## 2013-01-18 MED ORDER — MORPHINE SULFATE 2 MG/ML IJ SOLN
2.0000 mg | INTRAMUSCULAR | Status: DC | PRN
  Administered 2013-01-18: 2 mg via INTRAVENOUS
  Filled 2013-01-18: qty 1

## 2013-01-18 MED ORDER — ALPRAZOLAM 0.25 MG PO TABS: 0.2500 mg | ORAL_TABLET | Freq: Two times a day (BID) | ORAL | Status: DC | PRN

## 2013-01-18 MED ORDER — FLUTICASONE PROPIONATE HFA 220 MCG/ACT IN AERO
1.0000 | INHALATION_SPRAY | Freq: Two times a day (BID) | RESPIRATORY_TRACT | Status: DC
  Filled 2013-01-18 (×2): qty 12

## 2013-01-18 MED ORDER — VITAMIN D3 25 MCG (1000 UNIT) PO TABS
1000.0000 [IU] | ORAL_TABLET | Freq: Every day | ORAL | Status: DC
  Administered 2013-01-18 – 2013-01-19 (×2): 1000 [IU] via ORAL
  Filled 2013-01-18 (×2): qty 1

## 2013-01-18 MED ORDER — GI COCKTAIL ~~LOC~~
30.0000 mL | Freq: Once | ORAL | Status: AC
  Administered 2013-01-18: 30 mL via ORAL
  Filled 2013-01-18: qty 30

## 2013-01-18 MED ORDER — VITAMIN B-12 1000 MCG PO TABS
1000.0000 ug | ORAL_TABLET | Freq: Every day | ORAL | Status: DC
  Administered 2013-01-18 – 2013-01-19 (×2): 1000 ug via ORAL
  Filled 2013-01-18 (×3): qty 1

## 2013-01-18 MED ORDER — SODIUM CHLORIDE 0.9 % IV SOLN: 250.0000 mL | INTRAVENOUS | Status: DC | PRN

## 2013-01-18 MED ORDER — ZOLPIDEM TARTRATE 5 MG PO TABS: 5.0000 mg | ORAL_TABLET | Freq: Every evening | ORAL | Status: DC | PRN

## 2013-01-18 MED ORDER — NITROGLYCERIN 2 % TD OINT
1.0000 [in_us] | TOPICAL_OINTMENT | Freq: Four times a day (QID) | TRANSDERMAL | Status: DC
  Administered 2013-01-18 – 2013-01-19 (×3): 1 [in_us] via TOPICAL
  Filled 2013-01-18: qty 1

## 2013-01-18 MED ORDER — ATORVASTATIN CALCIUM 40 MG PO TABS
40.0000 mg | ORAL_TABLET | Freq: Every day | ORAL | Status: DC
  Administered 2013-01-18 – 2013-01-19 (×2): 40 mg via ORAL
  Filled 2013-01-18 (×2): qty 1

## 2013-01-18 MED ORDER — METOPROLOL TARTRATE 25 MG PO TABS
25.0000 mg | ORAL_TABLET | Freq: Two times a day (BID) | ORAL | Status: DC
  Administered 2013-01-18 – 2013-01-19 (×2): 25 mg via ORAL
  Filled 2013-01-18 (×3): qty 1

## 2013-01-18 MED ORDER — ENOXAPARIN SODIUM 40 MG/0.4ML ~~LOC~~ SOLN
40.0000 mg | SUBCUTANEOUS | Status: DC
  Administered 2013-01-18: 40 mg via SUBCUTANEOUS
  Filled 2013-01-18 (×2): qty 0.4

## 2013-01-18 MED ORDER — HYDROCODONE-ACETAMINOPHEN 5-325 MG PO TABS
2.0000 | ORAL_TABLET | ORAL | Status: DC | PRN
  Administered 2013-01-19: 2 via ORAL
  Filled 2013-01-18: qty 2

## 2013-01-18 NOTE — ED Notes (Signed)
Pt states "pain has come down quite a bit now." Pt states but I am still feeling short of breath. Pt alert and mentating appropriately. Pt states headache from nitroglycerin is gone.

## 2013-01-18 NOTE — ED Notes (Signed)
Cardiology at bedside.

## 2013-01-18 NOTE — ED Notes (Signed)
Applied nitro paste to chest; washed old paste off from 1348

## 2013-01-18 NOTE — ED Notes (Signed)
Pt states chest pain has dropped since earlier; pt states he does not feel as much tightness as he did earlier; pt states "it is still uncomfortable." pt denies n/v; pt denies numbness and tingling; pt states he is usually dizzy and lightheaded

## 2013-01-18 NOTE — ED Notes (Signed)
Portable chest xray at bedside.

## 2013-01-18 NOTE — ED Notes (Signed)
Kriste Basque (pt's girlfriend) [phone]

## 2013-01-18 NOTE — ED Notes (Signed)
Report given to floor nurse, Revonda Standard, RN; Nurse has no further questions upon report given; pt being prepared for transport to floor via Karen Chafe, EMT

## 2013-01-18 NOTE — H&P (Signed)
History and Physical   Patient ID: [name] MRN: [id number], DOB/AGE: [id number] y.o. Date of Encounter: 01/18/2013  Primary Physician: Alleen Borne Primary Cardiologist: PR  Chief Complaint:  [place] pain  HPI: [name] is a 79 y.o. male with a history of CAD.  [name] presents with chest pressure, SOB, and nausea.  Last night around 0100 he woke up diaphoretic with no other symptoms and was able to go back to sleep.  At 0800 he woke feeling tired and just not right.  He then took his dog out for a walk and started to experience chest pain described as 8/10 and pressure like radiating down his left arm.  He also became nauseated and more SOB than his usual state.  Upon returning to his house he took 2 nitro tabs with relief of the radiating pain but still continued to have the same chest pressure and SOB.  He then walked to the mailbox and became more SOB.  He then took 2 chewable baby ASA with no relief.  Denies any syncope, abdominal pain, fever or headache.  After arriving in the ED and receiving Nitro past and 2 more chewable baby ASA, his pain is now 5/10.  He reports not taking any of his other medications this AM.   Past Medical History  Diagnosis Date  . Hypertension   . Hyperlipidemia   . Chronic chest pain   . Dyspnea     chronic  . GERD (gastroesophageal reflux disease)     h/o esophageal spasm  . Asthma   . Back pain   . Coronary artery disease     a. s/p BMS to RCA in 2002; b. s/p cutting balloon POBA ;   c. cath 6/12: oDx 80% (treated with repeat cutting balloon POBA), mLAD 50% with 30-40% at Dx, CFX 30%, pRCA 25% with patent stents  . Myocardial infarction     Surgical History:  Past Surgical History  Procedure Laterality Date  . Cardiac catheterization    . Neck surgery      multiple  . Back surgery      multiple  . Laminectomy    . Posterior laminectomy / decompression cervical spine    . Anterior cervical decomp/discectomy fusion      . Coronary angioplasty with stent placement       previous percutaneous intervention on the  RCA and the diagonal branch  . Esophagogastroduodenoscopy  03/23/2012    Procedure: ESOPHAGOGASTRODUODENOSCOPY (EGD);  Surgeon: Vertell Novak., MD;  Location: [facility];  Service: Endoscopy;  Laterality: N/A;  . Savory dilation  03/23/2012    Procedure: SAVORY DILATION;  Surgeon: Vertell Novak., MD;  Location: [facility];  Service: Endoscopy;  Laterality: N/A;  . Tonsillectomy    . Eye surgery       I have reviewed the patient's current medications. Medication Sig  amLODipine (NORVASC) 5 MG tablet Take 5 mg by mouth daily.  aspirin EC 81 MG tablet Take 81 mg by mouth daily.  atorvastatin (LIPITOR) 40 MG tablet Take 40 mg by mouth daily.  cholecalciferol (VITAMIN D) 1000 UNITS tablet Take 1,000 Units by mouth daily.  clopidogrel (PLAVIX) 75 MG tablet Take 1 tablet (75 mg total) by mouth daily.  doxazosin (CARDURA) 4 MG tablet Take 1 tablet (4 mg total) by mouth at bedtime.  furosemide (LASIX) 40 MG tablet Take 40 mg by mouth daily as needed (for fluid.). For fluid retension  HYDROcodone-acetaminophen (NORCO/VICODIN) 5-325  MG per tablet Take 2 tablets by mouth every 4 (four) hours as needed for pain.  isosorbide mononitrate (IMDUR) 60 MG 24 hr tablet Take 1 tablet (60 mg total) by mouth daily.  metoprolol tartrate (LOPRESSOR) 25 MG tablet Take 1 tablet (25 mg total) by mouth 2 (two) times daily.  nitroGLYCERIN (NITROSTAT) 0.4 MG SL tablet Place 1 tablet (0.4 mg total) under the tongue every 5 (five) minutes as needed.  pantoprazole (PROTONIX) 40 MG tablet Take 1 tablet (40 mg total) by mouth 2 (two) times daily.  potassium chloride (K-DUR,KLOR-CON) 10 MEQ tablet Take 10 mEq by mouth daily.  Tamsulosin HCl (FLOMAX) 0.4 MG CAPS Take 0.4 mg by mouth daily.  vitamin B-12 (CYANOCOBALAMIN) 1000 MCG tablet Take 1,000 mcg by mouth daily.  albuterol (PROVENTIL HFA;VENTOLIN HFA) 108 (90 BASE)  MCG/ACT inhaler Inhale 2 puffs into the lungs every 6 (six) hours as needed for wheezing. For shortness of breath  colestipol (COLESTID) 1 G tablet Take 1 g by mouth 2 (two) times daily as needed. For diarrhea  fluticasone (FLOVENT HFA) 220 MCG/ACT inhaler Inhale 1 puff into the lungs 2 (two) times daily as needed. For shortness of breath   Allergies:  Allergies  Allergen Reactions  . Iohexol Shortness Of Breath and Itching    Pt was given 100cc of Omnipaque 300 followed by itching/ dyspnea.  . Budesonide-Formoterol Fumarate     REACTION: Mouth and tongue swelling  . Demerol (Meperidine) Other (See Comments)    Hallucinations  . Iodinated Diagnostic Agents     Iodine pt had a reaction when he had a CT DONE  . Meperidine Hcl     Hallucinations  . Naproxen Swelling  . Simvastatin   . Pregabalin Rash  . Sulfonamide Derivatives Rash   History   Social History  . Marital Status: Married    Spouse Name: N/A    Number of Children: N/A  . Years of Education: N/A   Occupational History  . retired     Forensic scientist   Social History Main Topics  . Smoking status: Never Smoker   . Smokeless tobacco: Not on file  . Alcohol Use: No  . Drug Use: No  . Sexually Active: No   Other Topics Concern  . Not on file   Social History Narrative   Lives in [place]. Generaly active around the house and walks his dog without chest pain or SOB.    Family History  Problem Relation Age of Onset  . Diabetes Father   . Heart disease Father   . Asthma Father   . Heart disease Mother     CABG hx age 46  . Colon cancer Son     hx   . Colitis Son     hx  . Crohn's disease Son    Review of Systems:   Full 14-point review of systems otherwise negative except as noted above.  Physical Exam: Blood pressure 147/81, pulse 57, temperature 98.7 F (37.1 C), temperature source Oral, resp. rate 12, SpO2 97.00%. General: Well developed, well nourished,male in no acute distress. Head:  Normocephalic, atraumatic, sclera non-icteric, no xanthomas, nares are without discharge.   Neck: No carotid bruits. JVD not elevated. No thyromegally Lungs: Good expansion bilaterally. without wheezes or rhonchi.  Heart: Regular rate and rhythm with S1 S2.  No S3 or S4.  No murmur, no rubs, or gallops appreciated. Abdomen: Soft, non-tender, non-distended with normoactive bowel sounds. No hepatomegaly. No rebound/guarding. No obvious abdominal masses. Msk:  Strength and tone appear normal for age. No joint deformities or effusions, no spine or costo-vertebral angle tenderness. Extremities: No clubbing or cyanosis. No edema.  Distal pedal pulses are 2+ in 4 extrem Neuro: Alert and oriented X 3. Moves all extremities spontaneously. No focal deficits noted. Psych:  Responds to questions appropriately with a normal affect. Skin: No rashes or lesions noted  Labs:   Lab Results  Component Value Date   WBC 4.9 01/18/2013   HGB 12.4* 01/18/2013   HCT 36.5* 01/18/2013   MCV 86.7 01/18/2013   PLT 216 01/18/2013     Recent Labs Lab 01/18/13 1248  NA 141  K 4.1  CL 105  CO2 29  BUN 9  CREATININE 0.98  CALCIUM 9.5  PROT 7.0  BILITOT 0.5  ALKPHOS 76  ALT 13  AST 19  GLUCOSE 107*   No results found for this basename: CKTOTAL, CKMB, TROPONINI,  in the last 72 hours  Recent Labs  01/18/13 1309 01/18/13 1600  TROPIPOC 0.00 0.02   Lab Results  Component Value Date   CHOL 143 01/07/2013   HDL 48.30 01/07/2013   LDLCALC 83 01/07/2013   TRIG 60.0 01/07/2013   Radiology/Studies: Dg Chest Port 1 View 01/18/2013  *RADIOLOGY REPORT*  Clinical Data: chest pain  PORTABLE CHEST - 1 VIEW  Comparison: 04/14/2012  Findings: The heart size is normal.  No pleural effusion or edema. No airspace consolidation identified.  IMPRESSION:  1.  No acute findings.   Original Report Authenticated By: Signa Kell, M.D.    Cardiac Cath: 04/06/2011 The left mainstem is patent. It divides into the LAD and left    circumflex. The patient has a short left main.  LAD. The LAD is patent to the apex. The vessel has 50% mid stenosis.  There is also plaquing around the area of the first diagonal but the LAD  stenosis is only about 30-40% through this region. The diagonal has an  80% ostial stenosis that is really only seen in a steep LAO cranial  projection is not appreciated in other views. It is a moderate-to-large  size diagonal.  Left circumflex. The left circumflex is fairly large in caliber. It  supplies two OMs with diffuse nonobstructive disease with up to 30%  stenosis.  FINAL ASSESSMENT:  1. Severe ostial diagonal stenosis with successful cutting balloon  angioplasty.  2. Continued patency of the right coronary artery stents.  3. Nonobstructive left anterior descending and left circumflex  stenosis.  Echo: 04/24/2010 - Left ventricle: Systolic function was normal. The estimated ejection fraction was in the range of 55% to 60%. Doppler parameters are consistent with abnormal left ventricular relaxation (grade 1 diastolic dysfunction). - Aortic valve: Mildly calcified annulus. Trileaflet; mildly calcified leaflets. - Mitral valve: Calcified annulus. Mild regurgitation. - Left atrium: The atrium was mildly dilated. - Tricuspid valve: Trivial regurgitation. - Pericardium, extracardiac: There was no pericardial effusion.  ECG:  18-Jan-2013 12:40:54 Normal ECG 77mm/s 40mm/mV 100Hz  8.0.1 12SL 241 HD CID: 1 Referred by: Unconfirmed Vent. rate 68 BPM PR interval 166 ms QRS duration 88 ms QT/QTc 378/401 ms P-R-T axes 50 45 25  ASSESSMENT AND PLAN:  Principal Problem:   Chest pain, mid sternal - Admit, R/O MI, add heparin if ez elevate, add nitro paste, DVT Lovenox. Has some mild abdominal tenderness and had nausea this am so will try a GI cocktail as well. If ez remain negative, MD advise on repeat cath vs OP MV. Pt had dye reaction  in past, will need premedication.  Otherwise, continue  home meds. Recent lipid profile well-controlled. Active Problems:   ESSENTIAL HYPERTENSION   GERD   Signed,  Henningsgaard, Elige Radon, Student-PA  Seen and agree with changes made. Theodore Demark, PA-C 01/18/2013 4:22 PM Beeper [phone] Patient with prior documented CAD. Developed SSCP this am while walking the dog. EKG shows no ischemic changes and is WNL. Chest xray normal.  Initial enzymes normal. Physical exam is normal. Will plan overnight observation and cycle enzymes and repeat EKG in am. If negative, plan repeat outpatient myoview. Old notes from Dr. Excell Seltzer indicate that significant ischemia should be proven before considering repeat PCI.

## 2013-01-18 NOTE — ED Provider Notes (Signed)
History     CSN: [id number]  Arrival date & time 01/18/13  1236   First MD Initiated Contact with Patient 01/18/13 1331      Chief Complaint  Patient presents with  . Chest Pain  . Shortness of Breath    (Consider location/radiation/quality/duration/timing/severity/associated sxs/prior treatment) HPI Comments: [name] is a 79 y.o. Male who states that he was well after awakening, today, but shortly thereafter, developed a pressure-like chest pain. The pain is left anterior. The pain is 8-9/10 and constant.  Is also had some brief episodes of sharp pain in the left chest that lasts just a few seconds. He has been nauseated and feels like he cannot take a deep breath. He denies diaphoresis. He denies cough, fever, vomiting, diarrhea, anorexia or generalized weakness. This morning prior to coming to the emergency department, he took 162 mg of aspirin, and 2 sublingual nitroglycerin pills. There was no change in his pain with these medications. He sees his cardiologist regularly. His last intervention was greater than 10 years ago. He has had 2 cardiac stents. There are no known modifying factors.  Patient is a 79 y.o. male presenting with chest pain and shortness of breath. The history is provided by the patient and a relative.  Chest Pain Associated symptoms: shortness of breath   Shortness of Breath Associated symptoms: chest pain     Past Medical History  Diagnosis Date  . Hypertension   . Hyperlipidemia   . Chronic chest pain   . Dyspnea     chronic  . GERD (gastroesophageal reflux disease)     h/o esophageal spasm  . Asthma   . Back pain   . Coronary artery disease     a. s/p BMS to RCA in 2002; b. s/p cutting balloon POBA ;   c. cath 6/12: oDx 80% (treated with repeat cutting balloon POBA), mLAD 50% with 30-40% at Dx, CFX 30%, pRCA 25% with patent stents  . Myocardial infarction     Past Surgical History  Procedure Laterality Date  . Cardiac catheterization    .  Neck surgery      multiple  . Back surgery      multiple  . Laminectomy    . Posterior laminectomy / decompression cervical spine    . Anterior cervical decomp/discectomy fusion    . Coronary angioplasty with stent placement       previous percutaneous intervention on the  RCA and the diagonal branch  . Esophagogastroduodenoscopy  03/23/2012    Procedure: ESOPHAGOGASTRODUODENOSCOPY (EGD);  Surgeon: Vertell Novak., MD;  Location: [facility];  Service: Endoscopy;  Laterality: N/A;  . Savory dilation  03/23/2012    Procedure: SAVORY DILATION;  Surgeon: Vertell Novak., MD;  Location: [facility];  Service: Endoscopy;  Laterality: N/A;  . Tonsillectomy    . Eye surgery      Family History  Problem Relation Age of Onset  . Diabetes Father   . Heart disease Father   . Asthma Father   . Heart disease Mother     CABG hx age 67  . Colon cancer Son     hx   . Colitis Son     hx  . Crohn's disease Son     History  Substance Use Topics  . Smoking status: Never Smoker   . Smokeless tobacco: Not on file  . Alcohol Use: No      Review of Systems  Respiratory: Positive for shortness of  breath.   Cardiovascular: Positive for chest pain.  All other systems reviewed and are negative.    Allergies  Iohexol; Budesonide-formoterol fumarate; Demerol; Iodinated diagnostic agents; Meperidine hcl; Naproxen; Simvastatin; Pregabalin; and Sulfonamide derivatives  Home Medications   Current Outpatient Rx  Name  Route  Sig  Dispense  Refill  . amLODipine (NORVASC) 5 MG tablet   Oral   Take 5 mg by mouth daily.         Marland Kitchen aspirin EC 81 MG tablet   Oral   Take 81 mg by mouth daily.         Marland Kitchen atorvastatin (LIPITOR) 40 MG tablet   Oral   Take 40 mg by mouth daily.         . cholecalciferol (VITAMIN D) 1000 UNITS tablet   Oral   Take 1,000 Units by mouth daily.         . clopidogrel (PLAVIX) 75 MG tablet   Oral   Take 1 tablet (75 mg total) by mouth daily.   90  tablet   3   . doxazosin (CARDURA) 4 MG tablet   Oral   Take 1 tablet (4 mg total) by mouth at bedtime.   90 tablet   3   . furosemide (LASIX) 40 MG tablet   Oral   Take 40 mg by mouth daily as needed (for fluid.). For fluid retension         . HYDROcodone-acetaminophen (NORCO/VICODIN) 5-325 MG per tablet   Oral   Take 2 tablets by mouth every 4 (four) hours as needed for pain.   20 tablet   0   . isosorbide mononitrate (IMDUR) 60 MG 24 hr tablet   Oral   Take 1 tablet (60 mg total) by mouth daily.   90 tablet   3   . metoprolol tartrate (LOPRESSOR) 25 MG tablet   Oral   Take 1 tablet (25 mg total) by mouth 2 (two) times daily.   180 tablet   3   . nitroGLYCERIN (NITROSTAT) 0.4 MG SL tablet   Sublingual   Place 1 tablet (0.4 mg total) under the tongue every 5 (five) minutes as needed.   25 tablet   6   . pantoprazole (PROTONIX) 40 MG tablet   Oral   Take 1 tablet (40 mg total) by mouth 2 (two) times daily.   180 tablet   3   . potassium chloride (K-DUR,KLOR-CON) 10 MEQ tablet   Oral   Take 10 mEq by mouth daily.         . Tamsulosin HCl (FLOMAX) 0.4 MG CAPS   Oral   Take 0.4 mg by mouth daily.         . vitamin B-12 (CYANOCOBALAMIN) 1000 MCG tablet   Oral   Take 1,000 mcg by mouth daily.         Marland Kitchen albuterol (PROVENTIL HFA;VENTOLIN HFA) 108 (90 BASE) MCG/ACT inhaler   Inhalation   Inhale 2 puffs into the lungs every 6 (six) hours as needed for wheezing. For shortness of breath         . colestipol (COLESTID) 1 G tablet   Oral   Take 1 g by mouth 2 (two) times daily as needed. For diarrhea         . fluticasone (FLOVENT HFA) 220 MCG/ACT inhaler   Inhalation   Inhale 1 puff into the lungs 2 (two) times daily as needed. For shortness of breath  BP 131/90  Pulse 57  Temp(Src) 98.2 F (36.8 C) (Oral)  Resp 16  SpO2 99%  Physical Exam  Nursing note and vitals reviewed. Constitutional: He is oriented to person, place, and  time. He appears well-developed and well-nourished.  HENT:  Head: Normocephalic and atraumatic.  Right Ear: External ear normal.  Left Ear: External ear normal.  Poor dentition, with multiple caries. No obvious dental abscess  Eyes: Conjunctivae and EOM are normal. Pupils are equal, round, and reactive to light.  Neck: Normal range of motion and phonation normal. Neck supple.  Cardiovascular: Normal rate, regular rhythm, normal heart sounds and intact distal pulses.   Pulmonary/Chest: Effort normal and breath sounds normal. No respiratory distress. He has no wheezes. He has no rales. He exhibits no tenderness and no bony tenderness.  Abdominal: Soft. Normal appearance. There is no tenderness.  Musculoskeletal: Normal range of motion.  Neurological: He is alert and oriented to person, place, and time. He has normal strength. No cranial nerve deficit or sensory deficit. He exhibits normal muscle tone. Coordination normal.  Skin: Skin is warm, dry and intact.  Psychiatric: He has a normal mood and affect. His behavior is normal. Judgment and thought content normal.    ED Course  Procedures (including critical care time) Initial treatment: Nitroglycerin ointment, aspirin, 162 mg. Observation on cardiac monitor Medications  morphine 2 MG/ML injection 2-4 mg (2 mg Intravenous Given 01/18/13 1720)  nitroGLYCERIN (NITROGLYN) 2 % ointment 1 inch (1 inch Topical Given 01/18/13 1900)  albuterol (PROVENTIL HFA;VENTOLIN HFA) 108 (90 BASE) MCG/ACT inhaler 2 puff (not administered)  amLODipine (NORVASC) tablet 5 mg (5 mg Oral Given 01/18/13 1912)  aspirin EC tablet 81 mg (81 mg Oral Not Given 01/18/13 1738)  atorvastatin (LIPITOR) tablet 40 mg (40 mg Oral Given 01/18/13 1913)  cholecalciferol (VITAMIN D) tablet 1,000 Units (1,000 Units Oral Given 01/18/13 1912)  clopidogrel (PLAVIX) tablet 75 mg (75 mg Oral Given 01/18/13 1709)  colestipol (COLESTID) tablet 1 g (not administered)  doxazosin (CARDURA) tablet 4 mg  (not administered)  fluticasone (FLOVENT HFA) 220 MCG/ACT inhaler 1 puff (1 puff Inhalation Not Given 01/18/13 1819)  HYDROcodone-acetaminophen (NORCO/VICODIN) 5-325 MG per tablet 2 tablet (not administered)  metoprolol tartrate (LOPRESSOR) tablet 25 mg (not administered)  pantoprazole (PROTONIX) EC tablet 40 mg (not administered)  potassium chloride SA (K-DUR,KLOR-CON) CR tablet 10 mEq (10 mEq Oral Given 01/18/13 1710)  tamsulosin (FLOMAX) capsule 0.4 mg (0.4 mg Oral Given 01/18/13 1900)  vitamin B-12 (CYANOCOBALAMIN) tablet 1,000 mcg (1,000 mcg Oral Given 01/18/13 1911)  nitroGLYCERIN (NITROGLYN) 2 % ointment 1 inch (1 inch Topical Given 01/18/13 1348)  aspirin chewable tablet 162 mg (162 mg Oral Given 01/18/13 1348)  gi cocktail (Maalox,Lidocaine,Donnatal) (30 mLs Oral Given 01/18/13 1710)    Patient Vitals for the past 24 hrs:  BP Temp Temp src Pulse Resp SpO2  01/18/13 1945 131/90 mmHg - - 57 16 99 %  01/18/13 1908 130/70 mmHg - - 65 16 97 %  01/18/13 1851 - 98.2 F (36.8 C) Oral - 14 97 %  01/18/13 1815 147/73 mmHg - - 63 17 97 %  01/18/13 1700 146/85 mmHg - - 58 9 97 %  01/18/13 1630 170/85 mmHg - - 68 19 99 %  01/18/13 1558 147/81 mmHg - - - 12 97 %  01/18/13 1545 147/81 mmHg - - 44 16 100 %  01/18/13 1430 147/86 mmHg - - 57 16 98 %  01/18/13 1345 154/74 mmHg - - 56  15 100 %  01/18/13 1302 - 98.7 F (37.1 C) Oral - - -  01/18/13 1255 147/77 mmHg - - 61 15 98 %    Discussed with cardiology service to see and admit the patient    Date: 08/03/2012  Rate: 68  Rhythm: normal sinus rhythm  QRS Axis: normal  PR and QT Intervals: normal  ST/T Wave abnormalities: normal  PR and QRS Conduction Disutrbances:none  Narrative Interpretation:   Old EKG Reviewed: changes noted- rate faster   Consultation with cardiology service will see the patient in the ED  CRITICAL CARE Performed by: Mancel Bale L   Total critical care time: 35 minutes  Critical care time was exclusive of  separately billable procedures and treating other patients.  Critical care was necessary to treat or prevent imminent or life-threatening deterioration.  Critical care was time spent personally by me on the following activities: development of treatment plan with patient and/or surrogate as well as nursing, discussions with consultants, evaluation of patient's response to treatment, examination of patient, obtaining history from patient or surrogate, ordering and performing treatments and interventions, ordering and review of laboratory studies, ordering and review of radiographic studies, pulse oximetry and re-evaluation of patient's condition.  Labs Reviewed  CBC - Abnormal; Notable for the following:    RBC 4.21 (*)    Hemoglobin 12.4 (*)    HCT 36.5 (*)    All other components within normal limits  COMPREHENSIVE METABOLIC PANEL - Abnormal; Notable for the following:    Glucose, Bld 107 (*)    GFR calc non Af Amer 76 (*)    GFR calc Af Amer 88 (*)    All other components within normal limits  POCT I-STAT TROPONIN I  POCT I-STAT TROPONIN I   Dg Chest Port 1 View  01/18/2013  *RADIOLOGY REPORT*  Clinical Data: chest pain  PORTABLE CHEST - 1 VIEW  Comparison: 04/14/2012  Findings: The heart size is normal.  No pleural effusion or edema. No airspace consolidation identified.  IMPRESSION:  1.  No acute findings.   Original Report Authenticated By: Signa Kell, M.D.    Nursing Notes Reviewed/ Care Coordinated, and agree without changes. Applicable Imaging Reviewed Interpretation of Laboratory Data incorporated into ED treatment  1. Chest pain       MDM  Chest pain, with known coronary artery disease. Pain is persistent. Troponins x2, are negative. Patient needs evaluation and treatment by cardiology prior to discharge.    Plan: admit    Flint Melter, MD 01/18/13 2015

## 2013-01-18 NOTE — ED Notes (Signed)
Pt with hx of MI to ED c/o L sided chest pain when he woke up this am.  Walked his dog and began feeling sob and the sharp pains began radiating down his L arm.  Took 3 nitro with no relief.  Chewed to 81 mg asa.

## 2013-01-18 NOTE — ED Notes (Signed)
Messaged pharmacy for missing medications at 1700

## 2013-01-19 ENCOUNTER — Other Ambulatory Visit: Payer: Self-pay | Admitting: Physician Assistant

## 2013-01-19 DIAGNOSIS — R079 Chest pain, unspecified: Secondary | ICD-10-CM

## 2013-01-19 DIAGNOSIS — I2510 Atherosclerotic heart disease of native coronary artery without angina pectoris: Secondary | ICD-10-CM

## 2013-01-19 LAB — COMPREHENSIVE METABOLIC PANEL
ALT: 11 U/L (ref 0–53)
Albumin: 3 g/dL — ABNORMAL LOW (ref 3.5–5.2)
Alkaline Phosphatase: 62 U/L (ref 39–117)
Chloride: 105 mEq/L (ref 96–112)
Potassium: 3.7 mEq/L (ref 3.5–5.1)
Sodium: 140 mEq/L (ref 135–145)
Total Bilirubin: 0.4 mg/dL (ref 0.3–1.2)
Total Protein: 5.8 g/dL — ABNORMAL LOW (ref 6.0–8.3)

## 2013-01-19 NOTE — Progress Notes (Signed)
Utilization Review Completed.   Lindey Renzulli, RN, BSN Nurse Case Manager  [phone]  

## 2013-01-19 NOTE — Discharge Summary (Signed)
Discharge Summary   Patient ID: [name], MRN: [id number], DOB/AGE: 11/30/1930 79 y.o.  Admit date: 01/18/2013 Discharge date: 01/19/2013   Primary Care Physician:  [facility]   Primary Cardiologist:  Dr. Dietrich Pates   Reason for Admission:  Chest Pain  Primary Discharge Diagnoses:  1. Chest Pain - MI ruled out; plan outpatient stress myoview 2. CAD 3. Hypertension 4. Hyperlipidemia 5. GERD  Secondary Discharge Diagnoses:   Past Medical History  Diagnosis Date  . Hypertension   . Hyperlipidemia   . Chronic chest pain   . Dyspnea     chronic  . GERD (gastroesophageal reflux disease)     h/o esophageal spasm  . Asthma   . Back pain   . Coronary artery disease     a. s/p BMS to RCA in 2002; b. s/p cutting balloon POBA ;   c. cath 6/12: oDx 80% (treated with repeat cutting balloon POBA), mLAD 50% with 30-40% at Dx, CFX 30%, pRCA 25% with patent stents  . Myocardial infarction      Allergies:    Allergies  Allergen Reactions  . Iohexol Shortness Of Breath and Itching    Pt was given 100cc of Omnipaque 300 followed by itching/ dyspnea.  . Budesonide-Formoterol Fumarate     REACTION: Mouth and tongue swelling  . Demerol (Meperidine) Other (See Comments)    Hallucinations  . Iodinated Diagnostic Agents     Iodine pt had a reaction when he had a CT DONE  . Meperidine Hcl     Hallucinations  . Naproxen Swelling  . Simvastatin   . Pregabalin Rash  . Sulfonamide Derivatives Rash      Procedures Performed This Admission:   None   Hospital Course:  [name] is a 79 y.o. male with a history of CAD, status post prior PCI, HTN, HL, chronic chest pain, GERD.  Last intervention performed 6/12 findings and angioplasty to severe ostial diagonal stenosis.  He presented to the hospital with chest pressure, shortness of breath and nausea. He took aspirin at home without relief. He was placed on nitro paste the emergency room and 2 more aspirin were given. His pain  improved but did not resolve. Cardiac markers remained negative.  Patient has a history of chronic chest pain and has had many cardiac catheterization procedures in the past.  Dr. Excell Seltzer performed his last intervention and felt that he should undergo nuclear stress testing should he have recurrent chest pain. Significant ischemia should be proven before considering repeat PCI.  He remained chest pain-free. He was seen by Dr. Elease Hashimoto this morning and felt stable for discharge to home. He'll be set up for an outpatient stress test.  Discharge Vitals: Blood pressure 130/72, pulse 57, temperature 98.1 F (36.7 C), temperature source Oral, resp. rate 18, weight 172 lb 9.6 oz (78.291 kg), SpO2 99.00%.  Labs:  Recent Labs  01/18/13 1248 01/18/13 2103  WBC 4.9 5.4  HGB 12.4* 12.2*  HCT 36.5* 35.7*  MCV 86.7 86.2  PLT 216 204    Recent Labs  01/18/13 1248 01/18/13 2103 01/19/13 0205  NA 141  --  140  K 4.1  --  3.7  CL 105  --  105  CO2 29  --  29  BUN 9  --  10  CREATININE 0.98 0.92 0.93  CALCIUM 9.5  --  8.8  PROT 7.0  --  5.8*  BILITOT 0.5  --  0.4  ALKPHOS 76  --  62  ALT 13  --  11  AST 19  --  17    Recent Labs  02/16/13 2103 01/19/13 0153 01/19/13 0700  TROPONINI <0.30 <0.30 <0.30   Lab Results  Component Value Date   CHOL 143 01/07/2013   HDL 48.30 01/07/2013   LDLCALC 83 01/07/2013   TRIG 60.0 01/07/2013    Lab Results  Component Value Date   TSH 1.12 01/07/2013    Diagnostic Procedures and Studies:   Dg Chest Port 1 View  02/16/2013    IMPRESSION:  1.  No acute findings.   Original Report Authenticated By: Signa Kell, M.D.     Disposition:   Pt is being discharged home today in good condition.  Follow-up Plans & Appointments      Follow-up Information   Follow up with [facility]) In 1 week. (office will call to arrange stress test)    Contact information:   [address] [place] [address] [phone]       Follow up with Dietrich Pates, MD In 2 weeks. (office will call with an appointment)    Contact information:   [address] [place] [address] [phone]       Follow up with CLAGGETT,ELIN, PA-C. (as directed)    Contact information:   [address] [place] [address] [phone]       Discharge Medications    Medication List    TAKE these medications       albuterol 108 (90 BASE) MCG/ACT inhaler  Commonly known as:  PROVENTIL HFA;VENTOLIN HFA  Inhale 2 puffs into the lungs every 6 (six) hours as needed for wheezing. For shortness of breath     amLODipine 5 MG tablet  Commonly known as:  NORVASC  Take 5 mg by mouth daily.     aspirin EC 81 MG tablet  Take 81 mg by mouth daily.     atorvastatin 40 MG tablet  Commonly known as:  LIPITOR  Take 40 mg by mouth daily.     cholecalciferol 1000 UNITS tablet  Commonly known as:  VITAMIN D  Take 1,000 Units by mouth daily.     clopidogrel 75 MG tablet  Commonly known as:  PLAVIX  Take 1 tablet (75 mg total) by mouth daily.     colestipol 1 G tablet  Commonly known as:  COLESTID  Take 1 g by mouth 2 (two) times daily as needed. For diarrhea     doxazosin 4 MG tablet  Commonly known as:  CARDURA  Take 1 tablet (4 mg total) by mouth at bedtime.     fluticasone 220 MCG/ACT inhaler  Commonly known as:  FLOVENT HFA  Inhale 1 puff into the lungs 2 (two) times daily as needed. For shortness of breath     furosemide 40 MG tablet  Commonly known as:  LASIX  Take 40 mg by mouth daily as needed (for fluid.). For fluid retension     HYDROcodone-acetaminophen 5-325 MG per tablet  Commonly known as:  NORCO/VICODIN  Take 2 tablets by mouth every 4 (four) hours as needed for pain.     isosorbide mononitrate 60 MG 24 hr tablet  Commonly known as:  IMDUR  Take 1 tablet (60 mg total) by mouth daily.     metoprolol tartrate 25 MG tablet  Commonly known as:  LOPRESSOR  Take 1 tablet (25 mg total)  by mouth 2 (two) times daily.  nitroGLYCERIN 0.4 MG SL tablet  Commonly known as:  NITROSTAT  Place 1 tablet (0.4 mg total) under the tongue every 5 (five) minutes as needed.     pantoprazole 40 MG tablet  Commonly known as:  PROTONIX  Take 1 tablet (40 mg total) by mouth 2 (two) times daily.     potassium chloride 10 MEQ tablet  Commonly known as:  K-DUR,KLOR-CON  Take 10 mEq by mouth daily.     tamsulosin 0.4 MG Caps  Commonly known as:  FLOMAX  Take 0.4 mg by mouth daily.     vitamin B-12 1000 MCG tablet  Commonly known as:  CYANOCOBALAMIN  Take 1,000 mcg by mouth daily.         Outstanding Labs/Studies  1. Lexiscan Myoview  Duration of Discharge Encounter: Greater than 30 minutes including physician and PA time.  Signed, Tereso Newcomer, PA-C  3:59 PM 01/19/2013   See my note from day of discharge.  Vesta Mixer, Montez Hageman., MD, [facility] 01/20/2013, 7:53 AM Office - [phone] Pager [phone]

## 2013-01-19 NOTE — Progress Notes (Signed)
History and Physical   Patient ID: [name] MRN: [id number], DOB/AGE: [birth year] 79 y.o. Date of Encounter: 01/19/2013  Primary Physician: Alleen Borne Primary Cardiologist: PR  Chief Complaint:  Chesst pain  HPI: [name] is a 79 y.o. male with a history of CAD.  [name] presents with chest pressure, SOB, and nausea.  Last night around 0100 he woke up diaphoretic with no other symptoms and was able to go back to sleep.  At 0800 he woke feeling tired and just not right.  He then took his dog out for a walk and started to experience chest pain described as 8/10 and pressure like radiating down his left arm.  He also became nauseated and more SOB than his usual state.  Upon returning to his house he took 2 nitro tabs with relief of the radiating pain but still continued to have the same chest pressure and SOB.  He then walked to the mailbox and became more SOB.  He then took 2 chewable baby ASA with no relief.  Denies any syncope, abdominal pain, fever or headache.  After arriving in the ED and receiving Nitro past and 2 more chewable baby ASA, his pain is now 5/10.  He reports not taking any of his other medications this AM.  He has done well. No further CP. Cardiac enzymes are negative.  Past Medical History  Diagnosis Date  . Hypertension   . Hyperlipidemia   . Chronic chest pain   . Dyspnea     chronic  . GERD (gastroesophageal reflux disease)     h/o esophageal spasm  . Asthma   . Back pain   . Coronary artery disease     a. s/p BMS to RCA in 2002; b. s/p cutting balloon POBA ;   c. cath 6/12: oDx 80% (treated with repeat cutting balloon POBA), mLAD 50% with 30-40% at Dx, CFX 30%, pRCA 25% with patent stents  . Myocardial infarction     Surgical History:  Past Surgical History  Procedure Laterality Date  . Cardiac catheterization    . Neck surgery      multiple  . Back surgery      multiple  . Laminectomy    . Posterior laminectomy / decompression  cervical spine    . Anterior cervical decomp/discectomy fusion    . Coronary angioplasty with stent placement       previous percutaneous intervention on the  RCA and the diagonal branch  . Esophagogastroduodenoscopy  03/23/2012    Procedure: ESOPHAGOGASTRODUODENOSCOPY (EGD);  Surgeon: Vertell Novak., MD;  Location: [facility];  Service: Endoscopy;  Laterality: N/A;  . Savory dilation  03/23/2012    Procedure: SAVORY DILATION;  Surgeon: Vertell Novak., MD;  Location: [facility];  Service: Endoscopy;  Laterality: N/A;  . Tonsillectomy    . Eye surgery       I have reviewed the patient's current medications. Medication Sig  amLODipine (NORVASC) 5 MG tablet Take 5 mg by mouth daily.  aspirin EC 81 MG tablet Take 81 mg by mouth daily.  atorvastatin (LIPITOR) 40 MG tablet Take 40 mg by mouth daily.  cholecalciferol (VITAMIN D) 1000 UNITS tablet Take 1,000 Units by mouth daily.  clopidogrel (PLAVIX) 75 MG tablet Take 1 tablet (75 mg total) by mouth daily.  doxazosin (CARDURA) 4 MG tablet Take 1 tablet (4 mg total) by mouth at bedtime.  furosemide (LASIX) 40 MG tablet Take 40 mg by mouth daily as  needed (for fluid.). For fluid retension  HYDROcodone-acetaminophen (NORCO/VICODIN) 5-325 MG per tablet Take 2 tablets by mouth every 4 (four) hours as needed for pain.  isosorbide mononitrate (IMDUR) 60 MG 24 hr tablet Take 1 tablet (60 mg total) by mouth daily.  metoprolol tartrate (LOPRESSOR) 25 MG tablet Take 1 tablet (25 mg total) by mouth 2 (two) times daily.  nitroGLYCERIN (NITROSTAT) 0.4 MG SL tablet Place 1 tablet (0.4 mg total) under the tongue every 5 (five) minutes as needed.  pantoprazole (PROTONIX) 40 MG tablet Take 1 tablet (40 mg total) by mouth 2 (two) times daily.  potassium chloride (K-DUR,KLOR-CON) 10 MEQ tablet Take 10 mEq by mouth daily.  Tamsulosin HCl (FLOMAX) 0.4 MG CAPS Take 0.4 mg by mouth daily.  vitamin B-12 (CYANOCOBALAMIN) 1000 MCG tablet Take 1,000 mcg by mouth  daily.  albuterol (PROVENTIL HFA;VENTOLIN HFA) 108 (90 BASE) MCG/ACT inhaler Inhale 2 puffs into the lungs every 6 (six) hours as needed for wheezing. For shortness of breath  colestipol (COLESTID) 1 G tablet Take 1 g by mouth 2 (two) times daily as needed. For diarrhea  fluticasone (FLOVENT HFA) 220 MCG/ACT inhaler Inhale 1 puff into the lungs 2 (two) times daily as needed. For shortness of breath   Allergies:  Allergies  Allergen Reactions  . Iohexol Shortness Of Breath and Itching    Pt was given 100cc of Omnipaque 300 followed by itching/ dyspnea.  . Budesonide-Formoterol Fumarate     REACTION: Mouth and tongue swelling  . Demerol (Meperidine) Other (See Comments)    Hallucinations  . Iodinated Diagnostic Agents     Iodine pt had a reaction when he had a CT DONE  . Meperidine Hcl     Hallucinations  . Naproxen Swelling  . Simvastatin   . Pregabalin Rash  . Sulfonamide Derivatives Rash   History   Social History  . Marital Status: Married    Spouse Name: N/A    Number of Children: N/A  . Years of Education: N/A   Occupational History  . retired     Forensic scientist   Social History Main Topics  . Smoking status: Never Smoker   . Smokeless tobacco: Not on file  . Alcohol Use: No  . Drug Use: No  . Sexually Active: No   Other Topics Concern  . Not on file   Social History Narrative   Lives in [place]. Generaly active around the house and walks his dog without chest pain or SOB.    Family History  Problem Relation Age of Onset  . Diabetes Father   . Heart disease Father   . Asthma Father   . Heart disease Mother     CABG hx age 60  . Colon cancer Son     hx   . Colitis Son     hx  . Crohn's disease Son    Review of Systems:   Full 14-point review of systems otherwise negative except as noted above.  Physical Exam: Blood pressure 120/68, pulse 53, temperature 98.1 F (36.7 C), temperature source Oral, resp. rate 17, weight 172 lb 9.6 oz (78.291  kg), SpO2 97.00%. General: Well developed, well nourished,male in no acute distress. Head: Normocephalic, atraumatic, sclera non-icteric, no xanthomas, nares are without discharge.   Neck: No carotid bruits. JVD not elevated.  Lungs: clear   Heart: Regular rate and rhythm with S1 S2.  No S3 or S4.  No murmur, no rubs, or gallops  Abdomen: Soft, non-tender, non-distended with normoactive  bowel sounds. No hepatomegaly. No rebound/guarding. No obvious abdominal masses. Msk:  Strength and tone appear normal for age. Extremities: No clubbing or cyanosis. No edema.   Neuro: Alert and oriented X 3. Moves all extremities spontaneously. Psych:  Responds to questions appropriately with a normal affect. Skin: No rashes or lesions noted  Labs:   Lab Results  Component Value Date   WBC 5.4 01/18/2013   HGB 12.2* 01/18/2013   HCT 35.7* 01/18/2013   MCV 86.2 01/18/2013   PLT 204 01/18/2013     Recent Labs Lab 01/19/13 0205  NA 140  K 3.7  CL 105  CO2 29  BUN 10  CREATININE 0.93  CALCIUM 8.8  PROT 5.8*  BILITOT 0.4  ALKPHOS 62  ALT 11  AST 17  GLUCOSE 122*    Recent Labs  01/18/13 2103 01/19/13 0153 01/19/13 0700  TROPONINI <0.30 <0.30 <0.30    Recent Labs  01/18/13 1309 01/18/13 1600  TROPIPOC 0.00 0.02   Lab Results  Component Value Date   CHOL 143 01/07/2013   HDL 48.30 01/07/2013   LDLCALC 83 01/07/2013   TRIG 60.0 01/07/2013   Radiology/Studies: Dg Chest Port 1 View 01/18/2013  *RADIOLOGY REPORT*  Clinical Data: chest pain  PORTABLE CHEST - 1 VIEW  Comparison: 04/14/2012  Findings: The heart size is normal.  No pleural effusion or edema. No airspace consolidation identified.  IMPRESSION:  1.  No acute findings.   Original Report Authenticated By: Signa Kell, M.D.    Cardiac Cath: 04/06/2011 The left mainstem is patent. It divides into the LAD and left  circumflex. The patient has a short left main.  LAD. The LAD is patent to the apex. The vessel has 50% mid stenosis.    There is also plaquing around the area of the first diagonal but the LAD  stenosis is only about 30-40% through this region. The diagonal has an  80% ostial stenosis that is really only seen in a steep LAO cranial  projection is not appreciated in other views. It is a moderate-to-large  size diagonal.  Left circumflex. The left circumflex is fairly large in caliber. It  supplies two OMs with diffuse nonobstructive disease with up to 30%  stenosis.  FINAL ASSESSMENT:  1. Severe ostial diagonal stenosis with successful cutting balloon  angioplasty.  2. Continued patency of the right coronary artery stents.  3. Nonobstructive left anterior descending and left circumflex  stenosis.  Echo: 04/24/2010 - Left ventricle: Systolic function was normal. The estimated ejection fraction was in the range of 55% to 60%. Doppler parameters are consistent with abnormal left ventricular relaxation (grade 1 diastolic dysfunction). - Aortic valve: Mildly calcified annulus. Trileaflet; mildly calcified leaflets. - Mitral valve: Calcified annulus. Mild regurgitation. - Left atrium: The atrium was mildly dilated. - Tricuspid valve: Trivial regurgitation. - Pericardium, extracardiac: There was no pericardial effusion.  ECG:  18-Jan-2013 12:40:54 Normal ECG 59mm/s 82mm/mV 100Hz  8.0.1 12SL 241 HD CID: 1 Referred by: Unconfirmed Vent. rate 68 BPM PR interval 166 ms QRS duration 88 ms QT/QTc 378/401 ms P-R-T axes 50 45 25  ASSESSMENT AND PLAN:   1. CAD:  As per Dr. Earmon Phoenix notes, he would like to demonstrate ischemia before going back in for cath.  His cardiac enzymes are negative. CP is better.  He as NTG patch on.  DC on home meds. DC ntg patch - change back to Imdur 60  Arrange for Lexiscan Myoview this week.   Will DC to home today.  Vesta Mixer, Montez Hageman., MD, [facility] 01/19/2013, 11:01 AM Office - [phone] Pager [phone]

## 2013-01-31 ENCOUNTER — Ambulatory Visit (HOSPITAL_COMMUNITY): Payer: Medicare Other | Attending: Cardiology | Admitting: Radiology

## 2013-01-31 VITALS — BP 145/71 | Ht 71.0 in | Wt 172.0 lb

## 2013-01-31 DIAGNOSIS — R079 Chest pain, unspecified: Secondary | ICD-10-CM

## 2013-01-31 DIAGNOSIS — R0989 Other specified symptoms and signs involving the circulatory and respiratory systems: Secondary | ICD-10-CM | POA: Insufficient documentation

## 2013-01-31 DIAGNOSIS — R110 Nausea: Secondary | ICD-10-CM | POA: Insufficient documentation

## 2013-01-31 DIAGNOSIS — I252 Old myocardial infarction: Secondary | ICD-10-CM | POA: Insufficient documentation

## 2013-01-31 DIAGNOSIS — R000 Tachycardia, unspecified: Secondary | ICD-10-CM | POA: Insufficient documentation

## 2013-01-31 DIAGNOSIS — Z8249 Family history of ischemic heart disease and other diseases of the circulatory system: Secondary | ICD-10-CM | POA: Insufficient documentation

## 2013-01-31 DIAGNOSIS — I10 Essential (primary) hypertension: Secondary | ICD-10-CM | POA: Insufficient documentation

## 2013-01-31 DIAGNOSIS — Z9861 Coronary angioplasty status: Secondary | ICD-10-CM | POA: Insufficient documentation

## 2013-01-31 DIAGNOSIS — R0609 Other forms of dyspnea: Secondary | ICD-10-CM | POA: Insufficient documentation

## 2013-01-31 DIAGNOSIS — R61 Generalized hyperhidrosis: Secondary | ICD-10-CM | POA: Insufficient documentation

## 2013-01-31 DIAGNOSIS — I2510 Atherosclerotic heart disease of native coronary artery without angina pectoris: Secondary | ICD-10-CM

## 2013-01-31 DIAGNOSIS — I219 Acute myocardial infarction, unspecified: Secondary | ICD-10-CM

## 2013-01-31 DIAGNOSIS — R0602 Shortness of breath: Secondary | ICD-10-CM

## 2013-01-31 MED ORDER — TECHNETIUM TC 99M SESTAMIBI GENERIC - CARDIOLITE
30.0000 | Freq: Once | INTRAVENOUS | Status: AC | PRN
  Administered 2013-01-31: 30 via INTRAVENOUS

## 2013-01-31 MED ORDER — TECHNETIUM TC 99M SESTAMIBI GENERIC - CARDIOLITE
10.0000 | Freq: Once | INTRAVENOUS | Status: AC | PRN
  Administered 2013-01-31: 10 via INTRAVENOUS

## 2013-01-31 MED ORDER — REGADENOSON 0.4 MG/5ML IV SOLN
0.4000 mg | Freq: Once | INTRAVENOUS | Status: AC
  Administered 2013-01-31: 0.4 mg via INTRAVENOUS

## 2013-01-31 NOTE — Progress Notes (Signed)
[facility] SITE 3 NUCLEAR MED [address] [place], [place] [address] [phone]    Cardiology Nuclear Med Study  [name] is a 79 y.o. male     MRN : [id number]     DOB: [birth year]  Procedure Date: 01/31/2013  Nuclear Med Background Indication for Stress Test:  Evaluation for Ischemia, Stent Patency, PTCA Patency and 01/18/13 Post Hospital: Chest Pressure, Negative enzymes/EKG History:  2012 MPS EF 58% Normal, 2012 Heart Catheterization- patent stents, n/o CAD PTCA,2011 Echo EF 55-60%,  2004 RCA, 2002 MI Cardiac Risk Factors: Family History - CAD, Hypertension and Lipids  Symptoms:  Chest Pain, Diaphoresis, DOE, Nausea, Rapid HR and SOB   Nuclear Pre-Procedure Caffeine/Decaff Intake:  None NPO After: 5:30pm   Lungs:  clear O2 Sat: 96% on room air. IV 0.9% NS with Angio Cath:  20g  IV Site: R Wrist  IV Started by:  Stanton Kidney, EMT-P  Chest Size (in):  42 Cup Size: n/a  Height: 5\' 11"  (1.803 m)  Weight:  172 lb (78.019 kg)  BMI:  Body mass index is 24 kg/(m^2). Tech Comments:  Meds were held this am, per patient.    Nuclear Med Study 1 or 2 day study: 1 day  Stress Test Type:  Eugenie Birks  Reading MD: Marca Ancona, MD  Order Authorizing Provider:  Dietrich Pates, MD  Resting Radionuclide: Technetium 27m Sestamibi  Resting Radionuclide Dose: 11.0 mCi   Stress Radionuclide:  Technetium 41m Sestamibi  Stress Radionuclide Dose: 33.0 mCi           Stress Protocol Rest HR: 60 Stress HR: 108  Rest BP: 145/71 Stress BP: 145/63  Exercise Time (min): n/a METS: n/a   Predicted Max HR: 141 bpm % Max HR: 76.6 bpm Rate Pressure Product: 57846   Dose of Adenosine (mg):  n/a Dose of Lexiscan: 0.4 mg  Dose of Atropine (mg): n/a Dose of Dobutamine: n/a mcg/kg/min (at max HR)  Stress Test Technologist: Bonnita Levan, RN  Nuclear Technologist:  Doyne Keel, CNMT     Rest Procedure:  Myocardial perfusion imaging was performed at rest 45 minutes following the intravenous  administration of Technetium 74m Sestamibi. Rest ECG: NSR - Normal EKG  Stress Procedure:  The patient received IV Lexiscan 0.4 mg over 15-seconds.  Technetium 32m Sestamibi injected at 30-seconds.  Quantitative spect images were obtained after a 45 minute delay. Stress ECG: No significant change from baseline ECG  QPS Raw Data Images:  Normal; no motion artifact; normal heart/lung ratio. Stress Images:  Medium-sized, mild basal to mid inferior perfusion defect.  Rest Images:  Medium-sized, mild basal to mid inferior perfusion defect.  Subtraction (SDS):  Partially reversible medium-sized, mild basal to mid inferior perfusion defect.  Transient Ischemic Dilatation (Normal <1.22):  1.05 Lung/Heart Ratio (Normal <0.45):  0.32  Quantitative Gated Spect Images QGS EDV:  111 ml QGS ESV:  43 ml  Impression Exercise Capacity:  Lexiscan with no exercise. BP Response:  Normal blood pressure response. Clinical Symptoms:  Chest pain.  ECG Impression:  No significant ST segment change suggestive of ischemia. Comparison with Prior Nuclear Study: Prior study showed no significant defect.   Overall Impression:  Low risk stress nuclear study.  There was a medium-sized, mild basal to mid inferior perfusion defect that was partially reversible.  This suggests prior inferior infarction with peri-infarct ischemia.   LV Ejection Fraction: 61%.  LV Wall Motion:  NL LV Function; NL Wall Motion  Marca Ancona 01/31/2013

## 2013-02-04 ENCOUNTER — Encounter: Payer: Self-pay | Admitting: Physician Assistant

## 2013-02-05 ENCOUNTER — Telehealth: Payer: Self-pay | Admitting: *Deleted

## 2013-02-05 NOTE — Telephone Encounter (Signed)
lmptcb to go over myoview results 

## 2013-02-05 NOTE — Telephone Encounter (Signed)
Message copied by Tarri Fuller on Tue Feb 05, 2013  2:12 PM ------      Message from: [place], [place] T      Created: Tue Feb 05, 2013  1:16 PM       Low risk stress test.      No signs of significant ischemia.      Reviewed with Dr. Dietrich Pates      Continue with current treatment plan.  No indication for cardiac cath.      Tereso Newcomer, PA-C  1:16 PM 02/05/2013 ------

## 2013-02-06 ENCOUNTER — Telehealth: Payer: Self-pay | Admitting: Nurse Practitioner

## 2013-02-06 NOTE — Telephone Encounter (Signed)
New problem   pts son wants to know if he still needs to bring his father to this appt

## 2013-02-06 NOTE — Telephone Encounter (Signed)
Message copied by Tarri Fuller on Wed Feb 06, 2013 11:51 AM ------      Message from: [place], [place] T      Created: Tue Feb 05, 2013  1:16 PM       Low risk stress test.      No signs of significant ischemia.      Reviewed with Dr. Dietrich Pates      Continue with current treatment plan.  No indication for cardiac cath.      Tereso Newcomer, PA-C  1:16 PM 02/05/2013 ------

## 2013-02-06 NOTE — Telephone Encounter (Signed)
s/w pt's son about myoview results and advised yes to keep appt with Dawayne Patricia, NP; son gave verbal understanding today

## 2013-02-08 ENCOUNTER — Encounter: Payer: Self-pay | Admitting: Nurse Practitioner

## 2013-02-08 ENCOUNTER — Ambulatory Visit (INDEPENDENT_AMBULATORY_CARE_PROVIDER_SITE_OTHER): Payer: Medicare Other | Admitting: Nurse Practitioner

## 2013-02-08 VITALS — BP 132/78 | HR 60 | Ht 70.0 in | Wt 176.0 lb

## 2013-02-08 DIAGNOSIS — R079 Chest pain, unspecified: Secondary | ICD-10-CM

## 2013-02-08 DIAGNOSIS — I2510 Atherosclerotic heart disease of native coronary artery without angina pectoris: Secondary | ICD-10-CM

## 2013-02-08 MED ORDER — ISOSORBIDE MONONITRATE ER 60 MG PO TB24: 90.0000 mg | ORAL_TABLET | Freq: Every day | ORAL | Status: DC

## 2013-02-08 NOTE — Progress Notes (Signed)
Jacquiline Doe Date of Birth: [birth year] Medical Record #[id number]  History of Present Illness: Mr. Pile is seen back today for a post hospital visit. He is seen for Dr. Tenny Craw. He has known CAD with prior PCI, last cath in June of 2012 per Dr. Excell Seltzer with recommendations outlined. Other issues include HTN, HLD, chrnic chest pain and GERD. His last coronary intervention was in June of 2012 with PTCA to the ostium of the diagonal. He has had multiple cardiac caths in the past.   Most recently admitted with recurrent chest pain. No objective findings. Referred for outpatient stress testing. Results are as noted below. This is felt to be stable and he will continue with medical management.   He comes in today. He is here with his son. Seems back to his baseline. Has chronic chest pain off and on. It had progressed and was associated with shortness of breath when he was admitted earlier this month. Now back to his usual baseline. Tries to stay active. No falls. Enjoys taking care of his little dog, Zoe.   Current Outpatient Prescriptions on File Prior to Visit  Medication Sig Dispense Refill  . albuterol (PROVENTIL HFA;VENTOLIN HFA) 108 (90 BASE) MCG/ACT inhaler Inhale 2 puffs into the lungs every 6 (six) hours as needed for wheezing. For shortness of breath      . amLODipine (NORVASC) 5 MG tablet Take 5 mg by mouth daily.      Marland Kitchen aspirin EC 81 MG tablet Take 81 mg by mouth daily.      Marland Kitchen atorvastatin (LIPITOR) 40 MG tablet Take 40 mg by mouth daily.      . cholecalciferol (VITAMIN D) 1000 UNITS tablet Take 1,000 Units by mouth daily.      . clopidogrel (PLAVIX) 75 MG tablet Take 1 tablet (75 mg total) by mouth daily.  90 tablet  3  . colestipol (COLESTID) 1 G tablet Take 1 g by mouth 2 (two) times daily as needed. For diarrhea      . doxazosin (CARDURA) 4 MG tablet Take 1 tablet (4 mg total) by mouth at bedtime.  90 tablet  3  . fluticasone (FLOVENT HFA) 220 MCG/ACT inhaler Inhale 1 puff into  the lungs 2 (two) times daily as needed. For shortness of breath      . furosemide (LASIX) 40 MG tablet Take 40 mg by mouth daily as needed (for fluid.). For fluid retension      . HYDROcodone-acetaminophen (NORCO/VICODIN) 5-325 MG per tablet Take 2 tablets by mouth every 4 (four) hours as needed for pain.  20 tablet  0  . metoprolol tartrate (LOPRESSOR) 25 MG tablet Take 1 tablet (25 mg total) by mouth 2 (two) times daily.  180 tablet  3  . nitroGLYCERIN (NITROSTAT) 0.4 MG SL tablet Place 1 tablet (0.4 mg total) under the tongue every 5 (five) minutes as needed.  25 tablet  6  . pantoprazole (PROTONIX) 40 MG tablet Take 1 tablet (40 mg total) by mouth 2 (two) times daily.  180 tablet  3  . potassium chloride (K-DUR,KLOR-CON) 10 MEQ tablet Take 10 mEq by mouth daily.      . Tamsulosin HCl (FLOMAX) 0.4 MG CAPS Take 0.4 mg by mouth daily.      . vitamin B-12 (CYANOCOBALAMIN) 1000 MCG tablet Take 1,000 mcg by mouth daily.       No current facility-administered medications on file prior to visit.    Allergies  Allergen Reactions  . Iohexol Shortness  Of Breath and Itching    Pt was given 100cc of Omnipaque 300 followed by itching/ dyspnea.  . Budesonide-Formoterol Fumarate     REACTION: Mouth and tongue swelling  . Demerol (Meperidine) Other (See Comments)    Hallucinations  . Ivp Dye (Iodinated Diagnostic Agents)     Iodine pt had a reaction when he had a CT DONE  . Meperidine Hcl     Hallucinations  . Naproxen Swelling  . Simvastatin   . Pregabalin Rash  . Sulfonamide Derivatives Rash    Past Medical History  Diagnosis Date  . Hypertension   . Hyperlipidemia   . Chronic chest pain   . Dyspnea     chronic  . GERD (gastroesophageal reflux disease)     h/o esophageal spasm  . Asthma   . Back pain   . Coronary artery disease     a. s/p BMS to RCA in 2002; b. s/p cutting balloon POBA ;   c. cath 6/12: oDx 80% (treated with repeat cutting balloon POBA), mLAD 50% with 30-40% at Dx,  CFX 30%, pRCA 25% with patent stents;  d.  Lex MV 4/14:  Low Risk - EF 61%, inf scar with peri-infarct ischemia  . Myocardial infarction     Past Surgical History  Procedure Laterality Date  . Cardiac catheterization    . Neck surgery      multiple  . Back surgery      multiple  . Laminectomy    . Posterior laminectomy / decompression cervical spine    . Anterior cervical decomp/discectomy fusion    . Coronary angioplasty with stent placement       previous percutaneous intervention on the  RCA and the diagonal branch  . Esophagogastroduodenoscopy  03/23/2012    Procedure: ESOPHAGOGASTRODUODENOSCOPY (EGD);  Surgeon: Vertell Novak., MD;  Location: [facility];  Service: Endoscopy;  Laterality: N/A;  . Savory dilation  03/23/2012    Procedure: SAVORY DILATION;  Surgeon: Vertell Novak., MD;  Location: [facility];  Service: Endoscopy;  Laterality: N/A;  . Tonsillectomy    . Eye surgery      History  Smoking status  . Never Smoker   Smokeless tobacco  . Not on file    History  Alcohol Use No    Family History  Problem Relation Age of Onset  . Diabetes Father   . Heart disease Father   . Asthma Father   . Heart disease Mother     CABG hx age 65  . Colon cancer Son     hx   . Colitis Son     hx  . Crohn's disease Son     Review of Systems: The review of systems is per the HPI.  All other systems were reviewed and are negative.  Physical Exam: BP 132/78  Pulse 60  Ht 5\' 10"  (1.778 m)  Wt 176 lb (79.833 kg)  BMI 25.25 kg/m2 Patient is very pleasant and in no acute distress. Skin is warm and dry. Color is normal.  HEENT is unremarkable except for very poor dentition. Normocephalic/atraumatic. PERRL. Sclera are nonicteric. Neck is supple. No masses. No JVD. Lungs are clear. Cardiac exam shows a regular rate and rhythm. Abdomen is soft. Extremities are without edema. Gait and ROM are intact. No gross neurologic deficits noted.  LABORATORY DATA: n/a  Lab  Results  Component Value Date   WBC 5.4 01/18/2013   HGB 12.2* 01/18/2013   HCT 35.7* 01/18/2013  PLT 204 01/18/2013   GLUCOSE 122* 01/19/2013   CHOL 143 01/07/2013   TRIG 60.0 01/07/2013   HDL 48.30 01/07/2013   LDLCALC 83 01/07/2013   ALT 11 01/19/2013   AST 17 01/19/2013   NA 140 01/19/2013   K 3.7 01/19/2013   CL 105 01/19/2013   CREATININE 0.93 01/19/2013   BUN 10 01/19/2013   CO2 29 01/19/2013   TSH 1.12 01/07/2013   PSA 5.73* 01/07/2013   INR 1.12 04/14/2012   Myoview Impression  Exercise Capacity: Lexiscan with no exercise.  BP Response: Normal blood pressure response.  Clinical Symptoms: Chest pain.  ECG Impression: No significant ST segment change suggestive of ischemia.  Comparison with Prior Nuclear Study: Prior study showed no significant defect.   Overall Impression: Low risk stress nuclear study. There was a medium-sized, mild basal to mid inferior perfusion defect that was partially reversible. This suggests prior inferior infarction with peri-infarct ischemia.   LV Ejection Fraction: 61%. LV Wall Motion: NL LV Function; NL Wall Motion  Marca Ancona  01/31/2013  CARDIAC CATH FROM June 2012 FINAL ASSESSMENT:  1. Severe ostial diagonal stenosis with successful cutting balloon  angioplasty.  2. Continued patency of the right coronary artery stents.  3. Nonobstructive left anterior descending and left circumflex  stenosis.   RECOMMENDATIONS: The patient should be continued on dual antiplatelet  therapy and aggressive medical management. This patient has had chronic  chest pain and many catheterization procedures. I suspect he will have  recurrent chest pain and would consider nuclear stress testing if this  occurs. I really do not think there is much more that could be safely  done with his diagonal ostium without jeopardizing the LAD. I think  that significant ischemia should be proven before considering repeat  PCI. Hopefully, he will have a good response and be angina free.    Veverly Fells. Excell Seltzer, MD  MDC/MEDQ D: 04/07/2011 T: 04/07/2011 Job: [id number]   Assessment / Plan: 1. Chronic chest pain - seems back to his baseline. Has been on chronic nitrate therapy. We will try him on 90 mg of Imdur. Keep his regular follow up with Dr. Tenny Craw unless problems develop in the interim.   2. Known CAD - Myoview earlier this week felt to be low risk - continue to manage medically.   3. HLD  Patient is agreeable to this plan and will call if any problems develop in the interim.   Rosalio Macadamia, RN, ANP-C [facility] [address] [place], [place]  [address]

## 2013-02-08 NOTE — Patient Instructions (Signed)
I would like to increase your Imdur to 90 mg a day.  This may help with your chest pain  We will tentatively see you back as planned  Call the [facility] office at [phone] if you have any questions, problems or concerns.

## 2013-02-14 ENCOUNTER — Telehealth: Payer: Self-pay | Admitting: Internal Medicine

## 2013-02-14 NOTE — Telephone Encounter (Signed)
Spoke with pt

## 2013-02-14 NOTE — Telephone Encounter (Signed)
New Prob    Calling in following up on some paperwork regarding handicap packet renewal. Would like to speak to nurse.

## 2013-02-19 ENCOUNTER — Telehealth: Payer: Self-pay | Admitting: Internal Medicine

## 2013-02-19 NOTE — Telephone Encounter (Signed)
Follow-up:    Called in wanting to speak with you regarding and update about the application for his father's handicap placard.  Please call back.

## 2013-02-20 NOTE — Telephone Encounter (Signed)
Notified that app was signed and mailed.

## 2013-02-22 ENCOUNTER — Telehealth: Payer: Self-pay | Admitting: Internal Medicine

## 2013-02-22 NOTE — Telephone Encounter (Signed)
All Cardiac records faxed to [facility]/ Dr.Paul Ollen Bowl  [phone] (fax)  02/22/13/KM

## 2013-03-19 ENCOUNTER — Other Ambulatory Visit: Payer: Self-pay | Admitting: *Deleted

## 2013-03-19 MED ORDER — ISOSORBIDE MONONITRATE ER 60 MG PO TB24: 90.0000 mg | ORAL_TABLET | Freq: Every day | ORAL | Status: DC

## 2013-07-08 ENCOUNTER — Ambulatory Visit (INDEPENDENT_AMBULATORY_CARE_PROVIDER_SITE_OTHER): Payer: Medicare Other | Admitting: Internal Medicine

## 2013-07-08 ENCOUNTER — Encounter: Payer: Self-pay | Admitting: Internal Medicine

## 2013-07-08 VITALS — BP 130/78 | HR 85 | Ht 70.0 in | Wt 171.0 lb

## 2013-07-08 DIAGNOSIS — R002 Palpitations: Secondary | ICD-10-CM

## 2013-07-08 DIAGNOSIS — N400 Enlarged prostate without lower urinary tract symptoms: Secondary | ICD-10-CM

## 2013-07-08 DIAGNOSIS — E782 Mixed hyperlipidemia: Secondary | ICD-10-CM

## 2013-07-08 LAB — BASIC METABOLIC PANEL
CO2: 29 mEq/L (ref 19–32)
Calcium: 9.4 mg/dL (ref 8.4–10.5)
Glucose, Bld: 103 mg/dL — ABNORMAL HIGH (ref 70–99)
Sodium: 139 mEq/L (ref 135–145)

## 2013-07-08 LAB — CBC WITH DIFFERENTIAL/PLATELET
Basophils Relative: 0.5 % (ref 0.0–3.0)
Eosinophils Relative: 1.3 % (ref 0.0–5.0)
HCT: 39.3 % (ref 39.0–52.0)
Hemoglobin: 13.2 g/dL (ref 13.0–17.0)
Lymphs Abs: 1.8 10*3/uL (ref 0.7–4.0)
Monocytes Relative: 11.9 % (ref 3.0–12.0)
Neutro Abs: 4.3 10*3/uL (ref 1.4–7.7)
Platelets: 202 10*3/uL (ref 150.0–400.0)
RBC: 4.24 Mil/uL (ref 4.22–5.81)
WBC: 7.1 10*3/uL (ref 4.5–10.5)

## 2013-07-08 LAB — LIPID PANEL: HDL: 56.4 mg/dL (ref >39.00)

## 2013-07-08 LAB — HEPATIC FUNCTION PANEL
AST: 20 U/L (ref 0–37)
Albumin: 3.7 g/dL (ref 3.5–5.2)
Alkaline Phosphatase: 61 U/L (ref 39–117)
Total Protein: 6.8 g/dL (ref 6.0–8.3)

## 2013-07-08 NOTE — Progress Notes (Signed)
[name] Date of Birth: 1930-11-25 Medical Record #[id number]  History of Present Illness: Patient is an 79 yo with a history of CAD, s/p PCI  Last cath in June 2012  At that time underwent PTCA to diagonal (ostium).   Also a history of HTN, HLD, chest pain, GERD  Had been admitted for CP earlier this year.  Myoview after showed no signif ischemia Seen by Darrel Hoover.   Since seen he has done OK from a cardiac standptoint.   Patient denies signif CP Biggest problem is back  Dr Danielle Dess is afraid to do anything  Seeing pain person  Has had a couple spells of heart racing  No dizziness  Does get SOB  No CP  Current Outpatient Prescriptions on File Prior to Visit  Medication Sig Dispense Refill  . amLODipine (NORVASC) 5 MG tablet Take 5 mg by mouth daily.      Marland Kitchen aspirin EC 81 MG tablet Take 81 mg by mouth daily.      Marland Kitchen atorvastatin (LIPITOR) 40 MG tablet Take 40 mg by mouth daily.      . cholecalciferol (VITAMIN D) 1000 UNITS tablet Take 1,000 Units by mouth daily.      . clopidogrel (PLAVIX) 75 MG tablet Take 1 tablet (75 mg total) by mouth daily.  90 tablet  3  . doxazosin (CARDURA) 4 MG tablet Take 1 tablet (4 mg total) by mouth at bedtime.  90 tablet  3  . isosorbide mononitrate (IMDUR) 60 MG 24 hr tablet Take 1.5 tablets (90 mg total) by mouth daily.  135 tablet  3  . metoprolol tartrate (LOPRESSOR) 25 MG tablet Take 1 tablet (25 mg total) by mouth 2 (two) times daily.  180 tablet  3  . nitroGLYCERIN (NITROSTAT) 0.4 MG SL tablet Place 1 tablet (0.4 mg total) under the tongue every 5 (five) minutes as needed.  25 tablet  6  . pantoprazole (PROTONIX) 40 MG tablet Take 1 tablet (40 mg total) by mouth 2 (two) times daily.  180 tablet  3  . potassium chloride (K-DUR,KLOR-CON) 10 MEQ tablet Take 10 mEq by mouth daily.      . Tamsulosin HCl (FLOMAX) 0.4 MG CAPS Take 0.4 mg by mouth daily.      . vitamin B-12 (CYANOCOBALAMIN) 1000 MCG tablet Take 1,000 mcg by mouth daily.       No  current facility-administered medications on file prior to visit.    Allergies  Allergen Reactions  . Iohexol Shortness Of Breath and Itching    Pt was given 100cc of Omnipaque 300 followed by itching/ dyspnea.  . Budesonide-Formoterol Fumarate     REACTION: Mouth and tongue swelling  . Demerol [Meperidine] Other (See Comments)    Hallucinations  . Ivp Dye [Iodinated Diagnostic Agents]     Iodine pt had a reaction when he had a CT DONE  . Meperidine Hcl     Hallucinations  . Naproxen Swelling  . Simvastatin   . Pregabalin Rash  . Sulfonamide Derivatives Rash    Past Medical History  Diagnosis Date  . Hypertension   . Hyperlipidemia   . Chronic chest pain   . Dyspnea     chronic  . GERD (gastroesophageal reflux disease)     h/o esophageal spasm  . Asthma   . Back pain   . Coronary artery disease     a. s/p BMS to RCA in 2002; b. s/p cutting balloon POBA ;   c. cath  6/12: oDx 80% (treated with repeat cutting balloon POBA), mLAD 50% with 30-40% at Dx, CFX 30%, pRCA 25% with patent stents;  d.  Lex MV 4/14:  Low Risk - EF 61%, inf scar with peri-infarct ischemia  . Myocardial infarction     Past Surgical History  Procedure Laterality Date  . Cardiac catheterization    . Neck surgery      multiple  . Back surgery      multiple  . Laminectomy    . Posterior laminectomy / decompression cervical spine    . Anterior cervical decomp/discectomy fusion    . Coronary angioplasty with stent placement       previous percutaneous intervention on the  RCA and the diagonal branch  . Esophagogastroduodenoscopy  03/23/2012    Procedure: ESOPHAGOGASTRODUODENOSCOPY (EGD);  Surgeon: Vertell Novak., MD;  Location: [facility];  Service: Endoscopy;  Laterality: N/A;  . Savory dilation  03/23/2012    Procedure: SAVORY DILATION;  Surgeon: Vertell Novak., MD;  Location: [facility];  Service: Endoscopy;  Laterality: N/A;  . Tonsillectomy    . Eye surgery      History  Smoking  status  . Never Smoker   Smokeless tobacco  . Not on file    History  Alcohol Use No    Family History  Problem Relation Age of Onset  . Diabetes Father   . Heart disease Father   . Asthma Father   . Heart disease Mother     CABG hx age 64  . Colon cancer Son     hx   . Colitis Son     hx  . Crohn's disease Son     Review of Systems: The review of systems is per the HPI.  All other systems were reviewed and are negative.  Physical Exam: BP 130/78  Pulse 85  Ht 5\' 10"  (1.778 m)  Wt 171 lb (77.565 kg)  BMI 24.54 kg/m2  SpO2 96% Patient is very pleasant and in no acute distress. Skin is warm and dry. Color is normal.  HEENT is unremarkable except for very poor dentition. Normocephalic/atraumatic. PERRL. Sclera are nonicteric. Neck is supple. No masses. No JVD. Lungs are clear. Cardiac exam shows a regular rate and rhythm. Abdomen is soft. Extremities are without edema. Gait and ROM are intact. No gross neurologic deficits noted.  LABORATORY DATA: n/a  Lab Results  Component Value Date   WBC 5.4 01/18/2013   HGB 12.2* 01/18/2013   HCT 35.7* 01/18/2013   PLT 204 01/18/2013   GLUCOSE 122* 01/19/2013   CHOL 143 01/07/2013   TRIG 60.0 01/07/2013   HDL 48.30 01/07/2013   LDLCALC 83 01/07/2013   ALT 11 01/19/2013   AST 17 01/19/2013   NA 140 01/19/2013   K 3.7 01/19/2013   CL 105 01/19/2013   CREATININE 0.93 01/19/2013   BUN 10 01/19/2013   CO2 29 01/19/2013   TSH 1.12 01/07/2013   PSA 5.73* 01/07/2013   INR 1.12 04/14/2012   Myoview Impression  Exercise Capacity: Lexiscan with no exercise.  BP Response: Normal blood pressure response.  Clinical Symptoms: Chest pain.  ECG Impression: No significant ST segment change suggestive of ischemia.  Comparison with Prior Nuclear Study: Prior study showed no significant defect.   Overall Impression: Low risk stress nuclear study. There was a medium-sized, mild basal to mid inferior perfusion defect that was partially reversible. This suggests prior  inferior infarction with peri-infarct ischemia.   LV Ejection  Fraction: 61%. LV Wall Motion: NL LV Function; NL Wall Motion  Marca Ancona  01/31/2013  CARDIAC CATH FROM June 2012 FINAL ASSESSMENT:  1. Severe ostial diagonal stenosis with successful cutting balloon  angioplasty.  2. Continued patency of the right coronary artery stents.  3. Nonobstructive left anterior descending and left circumflex  stenosis.   RECOMMENDATIONS: The patient should be continued on dual antiplatelet  therapy and aggressive medical management. This patient has had chronic  chest pain and many catheterization procedures. I suspect he will have  recurrent chest pain and would consider nuclear stress testing if this  occurs. I really do not think there is much more that could be safely  done with his diagonal ostium without jeopardizing the LAD. I think  that significant ischemia should be proven before considering repeat  PCI. Hopefully, he will have a good response and be angina free.  Veverly Fells. Excell Seltzer, MD  MDC/MEDQ D: 04/07/2011 T: 04/07/2011 Job: [id number]   Assessment / Plan: 1. Chronic chest pain - Denies signif pains  2. Known CAD - Myoview earlier this week felt to be low risk - continue to manage medically.   3. HLD  Will check lipids  4.  HTN  Follow  5.  Palpiations  Set up for event monitor.  Patient is agreeable to this plan and will call if any problems develop in the interim.   Rosalio Macadamia, RN, ANP-C [facility] [address] [place], [place]  [address]

## 2013-07-08 NOTE — Patient Instructions (Addendum)
Schedule Event Monitor    Lab Work today    Your physician wants you to follow-up in: 6 months You will receive a reminder letter in the mail two months in advance. If you don't receive a letter, please call our office to schedule the follow-up appointment.

## 2013-07-22 ENCOUNTER — Encounter: Payer: Self-pay | Admitting: Radiology

## 2013-07-22 ENCOUNTER — Encounter (INDEPENDENT_AMBULATORY_CARE_PROVIDER_SITE_OTHER): Payer: Medicare Other

## 2013-07-22 DIAGNOSIS — R002 Palpitations: Secondary | ICD-10-CM

## 2013-07-22 DIAGNOSIS — E782 Mixed hyperlipidemia: Secondary | ICD-10-CM

## 2013-07-22 DIAGNOSIS — N400 Enlarged prostate without lower urinary tract symptoms: Secondary | ICD-10-CM

## 2013-07-22 NOTE — Progress Notes (Signed)
Patient ID: [name], male   DOB: 1930/12/24, 79 y.o.   MRN: [id number] E Cardio Verite 30 day monitor

## 2013-09-02 ENCOUNTER — Telehealth: Payer: Self-pay | Admitting: *Deleted

## 2013-09-02 NOTE — Telephone Encounter (Signed)
Called patient in follow up to event monitor. Per Dr.Ross, the monitor showed sinus bradycardia to sinus rhythm with isolated PVC's. Symptoms of palpitations,SOB, and fatigue did not correlate with any arrhythmias. He was advised to stay on the same medications per Dr.Ross. Patient verbalized understanding. Advised in recall for a March 2015 appointment with PR.

## 2013-12-14 ENCOUNTER — Inpatient Hospital Stay (HOSPITAL_COMMUNITY)
Admission: EM | Admit: 2013-12-14 | Discharge: 2013-12-16 | DRG: 313 | Disposition: A | Discharge status: Home / Self Care | Payer: Medicare Other | Attending: Cardiology | Admitting: Cardiology

## 2013-12-14 ENCOUNTER — Emergency Department (HOSPITAL_COMMUNITY): Payer: Medicare Other

## 2013-12-14 ENCOUNTER — Encounter (HOSPITAL_COMMUNITY): Payer: Self-pay | Admitting: Emergency Medicine

## 2013-12-14 DIAGNOSIS — R0602 Shortness of breath: Secondary | ICD-10-CM | POA: Diagnosis present

## 2013-12-14 DIAGNOSIS — R110 Nausea: Secondary | ICD-10-CM | POA: Diagnosis present

## 2013-12-14 DIAGNOSIS — E785 Hyperlipidemia, unspecified: Secondary | ICD-10-CM | POA: Diagnosis present

## 2013-12-14 DIAGNOSIS — R0789 Other chest pain: Principal | ICD-10-CM | POA: Diagnosis present

## 2013-12-14 DIAGNOSIS — R072 Precordial pain: Secondary | ICD-10-CM

## 2013-12-14 DIAGNOSIS — J45909 Unspecified asthma, uncomplicated: Secondary | ICD-10-CM | POA: Diagnosis present

## 2013-12-14 DIAGNOSIS — I10 Essential (primary) hypertension: Secondary | ICD-10-CM | POA: Diagnosis present

## 2013-12-14 DIAGNOSIS — R5381 Other malaise: Secondary | ICD-10-CM | POA: Diagnosis present

## 2013-12-14 DIAGNOSIS — K219 Gastro-esophageal reflux disease without esophagitis: Secondary | ICD-10-CM | POA: Diagnosis present

## 2013-12-14 DIAGNOSIS — M549 Dorsalgia, unspecified: Secondary | ICD-10-CM | POA: Diagnosis present

## 2013-12-14 DIAGNOSIS — R079 Chest pain, unspecified: Secondary | ICD-10-CM | POA: Diagnosis present

## 2013-12-14 DIAGNOSIS — R5383 Other fatigue: Secondary | ICD-10-CM

## 2013-12-14 DIAGNOSIS — I252 Old myocardial infarction: Secondary | ICD-10-CM

## 2013-12-14 DIAGNOSIS — I2510 Atherosclerotic heart disease of native coronary artery without angina pectoris: Secondary | ICD-10-CM | POA: Diagnosis present

## 2013-12-14 LAB — CBC
HCT: 39 % (ref 39.0–52.0)
Hemoglobin: 13 g/dL (ref 13.0–17.0)
MCH: 31 pg (ref 26.0–34.0)
MCHC: 33.3 g/dL (ref 30.0–36.0)
MCV: 92.9 fL (ref 78.0–100.0)
Platelets: 183 10*3/uL (ref 150–400)
RBC: 4.2 MIL/uL — ABNORMAL LOW (ref 4.22–5.81)
RDW: 13.8 % (ref 11.5–15.5)
WBC: 6.5 10*3/uL (ref 4.0–10.5)

## 2013-12-14 LAB — BASIC METABOLIC PANEL
BUN: 9 mg/dL (ref 6–23)
CHLORIDE: 103 meq/L (ref 96–112)
CO2: 28 mEq/L (ref 19–32)
Calcium: 9.4 mg/dL (ref 8.4–10.5)
Creatinine, Ser: 0.92 mg/dL (ref 0.50–1.35)
GFR, EST AFRICAN AMERICAN: 90 mL/min — AB (ref >90)
GFR, EST NON AFRICAN AMERICAN: 78 mL/min — AB (ref >90)
Glucose, Bld: 98 mg/dL (ref 70–99)
POTASSIUM: 4.1 meq/L (ref 3.7–5.3)
Sodium: 142 mEq/L (ref 137–147)

## 2013-12-14 LAB — I-STAT TROPONIN, ED: Troponin i, poc: 0.02 ng/mL (ref 0.00–0.08)

## 2013-12-14 LAB — PRO B NATRIURETIC PEPTIDE: Pro B Natriuretic peptide (BNP): 729 pg/mL — ABNORMAL HIGH (ref 0–450)

## 2013-12-14 LAB — TROPONIN I
Troponin I: <0.3 ng/mL (ref <0.30)
Troponin I: <0.3 ng/mL (ref <0.30)

## 2013-12-14 MED ORDER — MORPHINE SULFATE 2 MG/ML IJ SOLN: 2.0000 mg | INTRAMUSCULAR | Status: DC | PRN

## 2013-12-14 MED ORDER — ONDANSETRON HCL 4 MG/2ML IJ SOLN
4.0000 mg | Freq: Once | INTRAMUSCULAR | Status: AC
  Administered 2013-12-14: 4 mg via INTRAVENOUS
  Filled 2013-12-14: qty 2

## 2013-12-14 MED ORDER — FLUTICASONE PROPIONATE HFA 220 MCG/ACT IN AERO
1.0000 | INHALATION_SPRAY | Freq: Two times a day (BID) | RESPIRATORY_TRACT | Status: DC
  Administered 2013-12-14 – 2013-12-15 (×3): 1 via RESPIRATORY_TRACT
  Filled 2013-12-14: qty 12

## 2013-12-14 MED ORDER — ALBUTEROL SULFATE HFA 108 (90 BASE) MCG/ACT IN AERS: 2.0000 | INHALATION_SPRAY | Freq: Two times a day (BID) | RESPIRATORY_TRACT | Status: DC | PRN

## 2013-12-14 MED ORDER — DOXAZOSIN MESYLATE 4 MG PO TABS
4.0000 mg | ORAL_TABLET | Freq: Every day | ORAL | Status: DC
  Administered 2013-12-14 – 2013-12-15 (×2): 4 mg via ORAL
  Filled 2013-12-14 (×3): qty 1

## 2013-12-14 MED ORDER — MORPHINE SULFATE 4 MG/ML IJ SOLN
4.0000 mg | Freq: Once | INTRAMUSCULAR | Status: AC
  Administered 2013-12-14: 4 mg via INTRAVENOUS
  Filled 2013-12-14: qty 1

## 2013-12-14 MED ORDER — ACETAMINOPHEN 325 MG PO TABS: 650.0000 mg | ORAL_TABLET | ORAL | Status: DC | PRN

## 2013-12-14 MED ORDER — ATORVASTATIN CALCIUM 40 MG PO TABS
40.0000 mg | ORAL_TABLET | Freq: Every day | ORAL | Status: DC
  Administered 2013-12-14 – 2013-12-16 (×3): 40 mg via ORAL
  Filled 2013-12-14 (×3): qty 1

## 2013-12-14 MED ORDER — ONDANSETRON HCL 4 MG/2ML IJ SOLN
4.0000 mg | Freq: Four times a day (QID) | INTRAMUSCULAR | Status: DC | PRN
  Administered 2013-12-14 – 2013-12-16 (×2): 4 mg via INTRAVENOUS
  Filled 2013-12-14 (×2): qty 2

## 2013-12-14 MED ORDER — METOPROLOL TARTRATE 25 MG PO TABS
25.0000 mg | ORAL_TABLET | Freq: Two times a day (BID) | ORAL | Status: DC
  Administered 2013-12-14 – 2013-12-15 (×3): 25 mg via ORAL
  Filled 2013-12-14 (×5): qty 1

## 2013-12-14 MED ORDER — NITROGLYCERIN 0.4 MG SL SUBL: 0.4000 mg | SUBLINGUAL_TABLET | SUBLINGUAL | Status: DC | PRN

## 2013-12-14 MED ORDER — ALBUTEROL SULFATE (2.5 MG/3ML) 0.083% IN NEBU: 2.5000 mg | INHALATION_SOLUTION | Freq: Two times a day (BID) | RESPIRATORY_TRACT | Status: DC | PRN

## 2013-12-14 MED ORDER — AMLODIPINE BESYLATE 5 MG PO TABS
5.0000 mg | ORAL_TABLET | Freq: Every day | ORAL | Status: DC
  Administered 2013-12-15 – 2013-12-16 (×2): 5 mg via ORAL
  Filled 2013-12-14 (×3): qty 1

## 2013-12-14 MED ORDER — CLOPIDOGREL BISULFATE 75 MG PO TABS
75.0000 mg | ORAL_TABLET | Freq: Every day | ORAL | Status: DC
  Administered 2013-12-14 – 2013-12-16 (×3): 75 mg via ORAL
  Filled 2013-12-14 (×3): qty 1

## 2013-12-14 MED ORDER — ISOSORBIDE MONONITRATE ER 60 MG PO TB24
60.0000 mg | ORAL_TABLET | Freq: Every day | ORAL | Status: DC
  Administered 2013-12-14 – 2013-12-16 (×3): 60 mg via ORAL
  Filled 2013-12-14 (×3): qty 1

## 2013-12-14 MED ORDER — POTASSIUM CHLORIDE CRYS ER 10 MEQ PO TBCR
10.0000 meq | EXTENDED_RELEASE_TABLET | Freq: Every day | ORAL | Status: DC
  Administered 2013-12-14 – 2013-12-16 (×3): 10 meq via ORAL
  Filled 2013-12-14 (×3): qty 1

## 2013-12-14 MED ORDER — HEPARIN BOLUS VIA INFUSION
4000.0000 [IU] | Freq: Once | INTRAVENOUS | Status: AC
  Administered 2013-12-14: 4000 [IU] via INTRAVENOUS
  Filled 2013-12-14: qty 4000

## 2013-12-14 MED ORDER — VITAMIN B-12 1000 MCG PO TABS
1000.0000 ug | ORAL_TABLET | Freq: Every day | ORAL | Status: DC
  Administered 2013-12-15 – 2013-12-16 (×2): 1000 ug via ORAL
  Filled 2013-12-14 (×3): qty 1

## 2013-12-14 MED ORDER — VITAMIN D3 25 MCG (1000 UNIT) PO TABS
5000.0000 [IU] | ORAL_TABLET | Freq: Every day | ORAL | Status: DC
  Administered 2013-12-15 – 2013-12-16 (×2): 5000 [IU] via ORAL
  Filled 2013-12-14 (×3): qty 5

## 2013-12-14 MED ORDER — ASPIRIN EC 81 MG PO TBEC
81.0000 mg | DELAYED_RELEASE_TABLET | Freq: Every day | ORAL | Status: DC
  Administered 2013-12-15 – 2013-12-16 (×2): 81 mg via ORAL
  Filled 2013-12-14 (×3): qty 1

## 2013-12-14 MED ORDER — COLESTIPOL HCL 1 G PO TABS
1.0000 g | ORAL_TABLET | Freq: Two times a day (BID) | ORAL | Status: DC
  Administered 2013-12-14 – 2013-12-16 (×4): 1 g via ORAL
  Filled 2013-12-14 (×5): qty 1

## 2013-12-14 MED ORDER — PANTOPRAZOLE SODIUM 40 MG PO TBEC
40.0000 mg | DELAYED_RELEASE_TABLET | Freq: Two times a day (BID) | ORAL | Status: DC
  Administered 2013-12-14 – 2013-12-16 (×4): 40 mg via ORAL
  Filled 2013-12-14 (×4): qty 1

## 2013-12-14 MED ORDER — HEPARIN (PORCINE) IN NACL 100-0.45 UNIT/ML-% IJ SOLN
800.0000 [IU]/h | INTRAMUSCULAR | Status: DC
  Administered 2013-12-14: 1000 [IU]/h via INTRAVENOUS
  Administered 2013-12-15: 950 [IU]/h via INTRAVENOUS
  Filled 2013-12-14 (×3): qty 250

## 2013-12-14 MED ORDER — FUROSEMIDE 40 MG PO TABS
40.0000 mg | ORAL_TABLET | Freq: Every day | ORAL | Status: DC | PRN
  Filled 2013-12-14: qty 1

## 2013-12-14 NOTE — ED Provider Notes (Signed)
CSN: [id number]     Arrival date & time 12/14/13  1011 History   First MD Initiated Contact with Patient 12/14/13 1037     Chief Complaint  Patient presents with  . Chest Pain     (Consider location/radiation/quality/duration/timing/severity/associated sxs/prior Treatment) HPI  80 year old male with history of hypertension, HDL, chronic chest pain, GERD, history of CAD with prior PCI, last cath in June 2012 per Dr. Burt Knack along with multiple cardiac cath in the past, who presents complaining of chest pain.  Patient reports he was awoke this morning with mid chest pressure, radiates to his left arm with associated shortness of breath, and mild nausea. Pain feels similar to prior MI back in 2000. He took 3 baby aspirin and 2 sublingual nitroglycerin with minimal relief. His initial pain was 8/10 and his pain currently is 4 of 10. Pain has not fully resolved. Pain felt different from GERD. No complaints of fever, headache, back pain, productive cough, abdominal pain, vomiting or diarrhea. denies lightheadedness or dizziness.      Past Medical History  Diagnosis Date  . Hypertension   . Hyperlipidemia   . Chronic chest pain   . Dyspnea     chronic  . GERD (gastroesophageal reflux disease)     h/o esophageal spasm  . Asthma   . Back pain   . Coronary artery disease     a. s/p BMS to RCA in 2002; b. s/p cutting balloon POBA ;   c. cath 6/12: oDx 80% (treated with repeat cutting balloon POBA), mLAD 50% with 30-40% at Dx, CFX 30%, pRCA 25% with patent stents;  d.  Lex MV 4/14:  Low Risk - EF 61%, inf scar with peri-infarct ischemia  . Myocardial infarction    Past Surgical History  Procedure Laterality Date  . Cardiac catheterization    . Neck surgery      multiple  . Back surgery      multiple  . Laminectomy    . Posterior laminectomy / decompression cervical spine    . Anterior cervical decomp/discectomy fusion    . Coronary angioplasty with stent placement       previous  percutaneous intervention on the  RCA and the diagonal branch  . Esophagogastroduodenoscopy  03/23/2012    Procedure: ESOPHAGOGASTRODUODENOSCOPY (EGD);  Surgeon: Winfield Cunas., MD;  Location: [facility];  Service: Endoscopy;  Laterality: N/A;  . Savory dilation  03/23/2012    Procedure: SAVORY DILATION;  Surgeon: Winfield Cunas., MD;  Location: [facility];  Service: Endoscopy;  Laterality: N/A;  . Tonsillectomy    . Eye surgery     Family History  Problem Relation Age of Onset  . Diabetes Father   . Heart disease Father   . Asthma Father   . Heart disease Mother     CABG hx age 4  . Colon cancer Son     hx   . Colitis Son     hx  . Crohn's disease Son    History  Substance Use Topics  . Smoking status: Never Smoker   . Smokeless tobacco: Not on file  . Alcohol Use: No    Review of Systems  All other systems reviewed and are negative.      Allergies  Iohexol; Budesonide-formoterol fumarate; Demerol; Ivp dye; Meperidine hcl; Naproxen; Simvastatin; Pregabalin; and Sulfonamide derivatives  Home Medications   Current Outpatient Rx  Name  Route  Sig  Dispense  Refill  . albuterol (PROVENTIL HFA;VENTOLIN HFA)  108 (90 BASE) MCG/ACT inhaler   Inhalation   Inhale 2 puffs into the lungs 2 (two) times daily as needed for wheezing or shortness of breath.         Marland Kitchen amLODipine (NORVASC) 5 MG tablet   Oral   Take 5 mg by mouth daily.         Marland Kitchen aspirin EC 81 MG tablet   Oral   Take 81 mg by mouth daily.         Marland Kitchen atorvastatin (LIPITOR) 40 MG tablet   Oral   Take 40 mg by mouth daily.         . cholecalciferol (VITAMIN D) 1000 UNITS tablet   Oral   Take 5,000 Units by mouth daily.          . clopidogrel (PLAVIX) 75 MG tablet   Oral   Take 1 tablet (75 mg total) by mouth daily.   90 tablet   3   . colestipol (COLESTID) 1 G tablet   Oral   Take 1 g by mouth 2 (two) times daily.         Marland Kitchen doxazosin (CARDURA) 4 MG tablet   Oral   Take 1  tablet (4 mg total) by mouth at bedtime.   90 tablet   3   . fluocinonide gel (LIDEX) 0.05 %   Topical   Apply 1 application topically as needed (insect bites).         . fluticasone (FLOVENT HFA) 220 MCG/ACT inhaler   Inhalation   Inhale 1 puff into the lungs 2 (two) times daily.         . furosemide (LASIX) 40 MG tablet   Oral   Take 40 mg by mouth daily as needed for edema.         . isosorbide mononitrate (IMDUR) 60 MG 24 hr tablet   Oral   Take 60 mg by mouth daily.         . metoprolol tartrate (LOPRESSOR) 25 MG tablet   Oral   Take 1 tablet (25 mg total) by mouth 2 (two) times daily.   180 tablet   3   . nitroGLYCERIN (NITROSTAT) 0.4 MG SL tablet   Sublingual   Place 1 tablet (0.4 mg total) under the tongue every 5 (five) minutes as needed.   25 tablet   6   . pantoprazole (PROTONIX) 40 MG tablet   Oral   Take 1 tablet (40 mg total) by mouth 2 (two) times daily.   180 tablet   3   . potassium chloride (K-DUR,KLOR-CON) 10 MEQ tablet   Oral   Take 10 mEq by mouth daily.         . Tamsulosin HCl (FLOMAX) 0.4 MG CAPS   Oral   Take 0.4 mg by mouth daily.         . vitamin B-12 (CYANOCOBALAMIN) 1000 MCG tablet   Oral   Take 1,000 mcg by mouth daily.          BP 145/90  Pulse 80  Temp(Src) 98.1 F (36.7 C) (Oral)  Resp 17  SpO2 100% Physical Exam  Nursing note and vitals reviewed. Constitutional: He is oriented to person, place, and time. He appears well-developed and well-nourished. No distress.  Awake, alert, nontoxic appearance  HENT:  Head: Atraumatic.  Eyes: Conjunctivae are normal. Right eye exhibits no discharge. Left eye exhibits no discharge.  Neck: Normal range of motion. Neck supple. No JVD present.  Cardiovascular:  Exam reveals no gallop and no friction rub.   No murmur heard. Normal rate, irregular rhythm  Pulmonary/Chest: Effort normal. No respiratory distress. He has no wheezes. He has no rales. He exhibits no  tenderness.  Abdominal: Soft. There is no tenderness. There is no rebound.  Musculoskeletal: He exhibits no edema and no tenderness.  ROM appears intact, no obvious focal weakness  Neurological: He is alert and oriented to person, place, and time.  Skin: Skin is warm and dry. No rash noted.  Psychiatric: He has a normal mood and affect.    ED Course  Procedures (including critical care time)  11:22 AM Patient presents with chest pain concerning for ACS. Initial EKG with ST changes and arrhythmia. Still having active pain. Pain medication given.  11:56 AM Patient report mild relief of chest pain after receiving morphine however pain has not resolved. We'll continue with pain management. Patient has normal initial troponin.  1:17 PM Patient reports his pain is mostly resolved. Still endorsed mild chest pressure. She has normal saturation. He is also on blood thinner medication, Plavix. Low suspicion for PE. Several attempts to consult cardiology without success, will continue to try.  1:36 PM i have consulted with cardiology who will see pt in ER and will admit for further care.    Labs Review Labs Reviewed  CBC - Abnormal; Notable for the following:    RBC 4.20 (*)    All other components within normal limits  BASIC METABOLIC PANEL - Abnormal; Notable for the following:    GFR calc non Af Amer 78 (*)    GFR calc Af Amer 90 (*)    All other components within normal limits  PRO B NATRIURETIC PEPTIDE - Abnormal; Notable for the following:    Pro B Natriuretic peptide (BNP) 729.0 (*)    All other components within normal limits  Randolm Idol, ED   Imaging Review Dg Chest 2 View  12/14/2013   CLINICAL DATA:  Midsternal chest pain.  Shortness of breath.  EXAM: CHEST  2 VIEW  COMPARISON:  01/18/2013 and 04/14/2012  FINDINGS: Lungs are adequately inflated without consolidation or effusion. The cardiomediastinal silhouette and remainder of the exam is unchanged.  IMPRESSION: No  acute cardiopulmonary disease.   Electronically Signed   By: Marin Olp M.D.   On: 12/14/2013 13:06     EKG Interpretation   Date/Time:  Saturday December 14 2013 10:14:03 EST Ventricular Rate:  79 PR Interval:  146 QRS Duration: 90 QT Interval:  366 QTC Calculation: 419 R Axis:   13 Text Interpretation:  Marked sinus bradycardia with frequent Premature  ventricular complexes ST abnormality, possible digitalis effect Abnormal  ECG Confirmed by BEATON  MD, ROBERT ([id number]) on 12/14/2013 11:20:10 AM      MDM   Final diagnoses:  Chest pain, mid sternal    BP 157/85  Pulse 71  Temp(Src) 98.1 F (36.7 C) (Oral)  Resp 17  SpO2 98%  I have reviewed nursing notes and vital signs. I personally reviewed the imaging tests through PACS system  I reviewed available ER/hospitalization records thought the EMR     Domenic Moras, [place] 12/14/13 1336

## 2013-12-14 NOTE — Progress Notes (Signed)
ANTICOAGULATION CONSULT NOTE - Initial Consult  Pharmacy Consult for heparin Indication: chest pain/ACS  Allergies  Allergen Reactions  . Iohexol Shortness Of Breath and Itching    Pt was given 100cc of Omnipaque 300 followed by itching/ dyspnea.  . Budesonide-Formoterol Fumarate     REACTION: Mouth and tongue swelling  . Demerol [Meperidine] Other (See Comments)    Hallucinations  . Ivp Dye [Iodinated Diagnostic Agents]     Iodine pt had a reaction when he had a CT DONE  . Meperidine Hcl     Hallucinations  . Naproxen Swelling  . Simvastatin   . Pregabalin Rash  . Sulfonamide Derivatives Rash    Patient Measurements:   Heparin Dosing Weight: 78 kg  Vital Signs: Temp: 98.1 F (36.7 C) (02/28 1017) Temp src: Oral (02/28 1017) BP: 146/90 mmHg (02/28 1500) Pulse Rate: 64 (02/28 1500)  Labs:  Recent Labs  12/14/13 1110  HGB 13.0  HCT 39.0  PLT 183  CREATININE 0.92    The CrCl is unknown because both a height and weight (above a minimum accepted value) are required for this calculation.   Medical History: Past Medical History  Diagnosis Date  . Hypertension   . Hyperlipidemia   . Chronic chest pain   . Dyspnea     chronic  . GERD (gastroesophageal reflux disease)     h/o esophageal spasm  . Asthma   . Back pain   . Coronary artery disease     a. s/p BMS to RCA in 2002; b. s/p cutting balloon POBA ;   c. cath 6/12: oDx 80% (treated with repeat cutting balloon POBA), mLAD 50% with 30-40% at Dx, CFX 30%, pRCA 25% with patent stents;  d.  Lex MV 4/14:  Low Risk - EF 61%, inf scar with peri-infarct ischemia  . Myocardial infarction     Medications:  Scheduled:   Infusions:    Assessment: 80 year old male with history of hypertension, HDL, chronic chest pain, GERD, history of CAD with prior PCI, last cath in June 2012 per Dr. Burt Knack along with multiple cardiac cath in the past, who presents complaining of chest pain. Baseline Hgb 13 and Plt 183 K. Patient  was not on any anticoagulation prior to admission.  Goal of Therapy:  Heparin level 0.3-0.7 units/ml Monitor platelets by anticoagulation protocol: Yes   Plan:  1) Heparin 4000 units bolus x1, then start heparin drip at 1000 units/hr 2) 8hr heparin level 3) Daily heparin level and CBC  Jazelyn Sipe, [place] 12/14/2013,4:31 PM

## 2013-12-14 NOTE — H&P (Signed)
Primary cardiologist: Dr Dorris Carnes Consulting cardiologist: Dr. Carlyle Dolly  Clinical Summary Mr. Maraj is a 80 y.o.male hx of CAD with prior stents presents with chest pain.  Patient reports episode of left sided chest and arm pain that awoke him from sleep. Constant x 5 hours, 8/10 dull pain with assoc SOB and nausea. Worst with deep breaths. Took NG x 2 without any benefit. Came to ER, symptoms improved with IV morphine.   Per notes history of chronic chest pain, multiple cath procedures in setting of chest pain. CAD: last cath June 2012 LM patent, LAD 50% mid, diag 80% ostial, LCX 30%, RCA 25% proximal with patent stents. The ostial diagonal lesion was opened with a cutting balloon, this lesion has been intervened on multiple times.  Recs from that cath were to consider non-invasive stress testing prior to future caths to varify ischemia, unclear there is much left to be done on the diagonal lesion.  - Myoview 01/2013 low risk, medium sized partially reversible basal to mid inferior defect consistent with prior infarct and peri-infarct ischemia, LVEF 61%.   EKG with no ischemic changes,  troponins neg x 1.  Allergies  Allergen Reactions  . Iohexol Shortness Of Breath and Itching    Pt was given 100cc of Omnipaque 300 followed by itching/ dyspnea.  . Budesonide-Formoterol Fumarate     REACTION: Mouth and tongue swelling  . Demerol [Meperidine] Other (See Comments)    Hallucinations  . Ivp Dye [Iodinated Diagnostic Agents]     Iodine pt had a reaction when he had a CT DONE  . Meperidine Hcl     Hallucinations  . Naproxen Swelling  . Simvastatin   . Pregabalin Rash  . Sulfonamide Derivatives Rash    Medications Scheduled Medications:    Infusions:    PRN Medications:        Past Medical History  Diagnosis Date  . Hypertension   . Hyperlipidemia   . Chronic chest pain   . Dyspnea     chronic  . GERD (gastroesophageal reflux disease)     h/o esophageal  spasm  . Asthma   . Back pain   . Coronary artery disease     a. s/p BMS to RCA in 2002; b. s/p cutting balloon POBA ;   c. cath 6/12: oDx 80% (treated with repeat cutting balloon POBA), mLAD 50% with 30-40% at Dx, CFX 30%, pRCA 25% with patent stents;  d.  Lex MV 4/14:  Low Risk - EF 61%, inf scar with peri-infarct ischemia  . Myocardial infarction     Past Surgical History  Procedure Laterality Date  . Cardiac catheterization    . Neck surgery      multiple  . Back surgery      multiple  . Laminectomy    . Posterior laminectomy / decompression cervical spine    . Anterior cervical decomp/discectomy fusion    . Coronary angioplasty with stent placement       previous percutaneous intervention on the  RCA and the diagonal Ebonique Hallstrom  . Esophagogastroduodenoscopy  03/23/2012    Procedure: ESOPHAGOGASTRODUODENOSCOPY (EGD);  Surgeon: Winfield Cunas., MD;  Location: [facility];  Service: Endoscopy;  Laterality: N/A;  . Savory dilation  03/23/2012    Procedure: SAVORY DILATION;  Surgeon: Winfield Cunas., MD;  Location: [facility];  Service: Endoscopy;  Laterality: N/A;  . Tonsillectomy    . Eye surgery      Family History  Problem Relation  Age of Onset  . Diabetes Father   . Heart disease Father   . Asthma Father   . Heart disease Mother     CABG hx age 60  . Colon cancer Son     hx   . Colitis Son     hx  . Crohn's disease Son     Social History Mr. Weagle reports that he has never smoked. He does not have any smokeless tobacco history on file. Mr. Sobieck reports that he does not drink alcohol.  Review of Systems CONSTITUTIONAL: No weight loss, fever, chills, weakness or fatigue.  HEENT: Eyes: No visual loss, blurred vision, double vision or yellow sclerae. No hearing loss, sneezing, congestion, runny nose or sore throat.  SKIN: No rash or itching.  CARDIOVASCULAR: per HPI RESPIRATORY: per HPI GASTROINTESTINAL: No anorexia, nausea, vomiting or diarrhea. No  abdominal pain or blood.  GENITOURINARY: no polyuria, no dysuria NEUROLOGICAL: No headache, dizziness, syncope, paralysis, ataxia, numbness or tingling in the extremities. No change in bowel or bladder control.  MUSCULOSKELETAL: No muscle, back pain, joint pain or stiffness.  HEMATOLOGIC: No anemia, bleeding or bruising.  LYMPHATICS: No enlarged nodes. No history of splenectomy.  PSYCHIATRIC: No history of depression or anxiety.      Physical Examination Blood pressure 146/90, pulse 64, temperature 98.1 F (36.7 C), temperature source Oral, resp. rate 15, SpO2 100.00%.  Intake/Output Summary (Last 24 hours) at 12/14/13 1541 Last data filed at 12/14/13 1353  Gross per 24 hour  Intake      0 ml  Output    400 ml  Net   -400 ml    Cardiovascular: RRR, no m/r/g, no JVD, no carotid bruits  Respiratory: CTAB  GI: abdomen soft, NT, ND  MSK: chest wall mildly tender to palpation.   Neuro: no focal deficits    Lab Results  Basic Metabolic Panel:  Recent Labs Lab 12/14/13 1110  NA 142  K 4.1  CL 103  CO2 28  GLUCOSE 98  BUN 9  CREATININE 0.92  CALCIUM 9.4    Liver Function Tests: No results found for this basename: AST, ALT, ALKPHOS, BILITOT, PROT, ALBUMIN,  in the last 168 hours  CBC:  Recent Labs Lab 12/14/13 1110  WBC 6.5  HGB 13.0  HCT 39.0  MCV 92.9  PLT 183    Cardiac Enzymes: No results found for this basename: CKTOTAL, CKMB, CKMBINDEX, TROPONINI,  in the last 168 hours  BNP: No components found with this basename: POCBNP,    ECG [place]  Imaging Cath 03/2011 PROCEDURAL FINDINGS: The RCA is dominant. It is a patent vessel with  25% proximal stenosis, widely patent stents throughout the midportion of  the vessel, and patent Deshonda Cryderman vessels in the PDA and posterolateral  branches.  The left mainstem is patent. It divides into the LAD and left  circumflex. The patient has a short left main.  LAD. The LAD is patent to the apex. The vessel  has 50% mid stenosis.  There is also plaquing around the area of the first diagonal but the LAD  stenosis is only about 30-40% through this region. The diagonal has an  80% ostial stenosis that is really only seen in a steep LAO cranial  projection is not appreciated in other views. It is a moderate-to-large  size diagonal.  Left circumflex. The left circumflex is fairly large in caliber. It  supplies two OMs with diffuse nonobstructive disease with up to 30%  stenosis.  FINAL ASSESSMENT:  1. Severe ostial diagonal stenosis with successful cutting balloon  angioplasty.  2. Continued patency of the right coronary artery stents.  3. Nonobstructive left anterior descending and left circumflex  stenosis.  RECOMMENDATIONS: The patient should be continued on dual antiplatelet  therapy and aggressive medical management. This patient has had chronic  chest pain and many catheterization procedures. I suspect he will have  recurrent chest pain and would consider nuclear stress testing if this  occurs. I really do not think there is much more that could be safely  done with his diagonal ostium without jeopardizing the LAD. I think  that significant ischemia should be proven before considering repeat  PCI. Hopefully, he will have a good response and be angina free.      Impression/Recommendations 1. Chest pain - history of chronic chest pain with multiple caths and interventions (>15 caths per patient report, at least 14 in our system. Stress test 01/2013 low risk, evidence of old infarct with some mild peri-infarct iscehmia - symptoms atypical in duration (5 hours consistent), and somewhat reproducible to palpation. Wound anticipate if 5 hours of ischemia troponins would be positive - will admit to tele, cycle cardiac enzymes and EKG. TIMI score >3, if true unstable angina he is high risk. Will start hep gtt, continue home ASA, plavix, statin, beta blocker.  - repeat echo to eval for new WMA or  decreased LVEF.   Carlyle Dolly, M.D., F.A.C.C.

## 2013-12-14 NOTE — ED Notes (Signed)
Pt reports waking up this am 0430 with mid chest pressure and cramping pain, pain into left shoulder and left arm. Having sob and nausea. Airway is intact, ekg done at triage.

## 2013-12-14 NOTE — ED Provider Notes (Signed)
Medical screening examination/treatment/procedure(s) were performed by non-physician practitioner and as supervising physician I was immediately available for consultation/collaboration.    Dot Lanes, MD 12/14/13 [phone]

## 2013-12-14 NOTE — ED Notes (Signed)
Pt reporting still feels chest "squeezing and pressure". Occasionally will have a sharp chest pain, that quickly subsides. ED PA made aware. Also, cardiology paged as they have not yet seen patient in ED.

## 2013-12-14 NOTE — ED Notes (Signed)
Patient transported to X-ray 

## 2013-12-14 NOTE — ED Notes (Signed)
Pt talking on cell phone. Waiting for cardiology. Pt son at bedside. States no pain" but SOB along with occasional squeezing and pressure.

## 2013-12-15 DIAGNOSIS — I517 Cardiomegaly: Secondary | ICD-10-CM

## 2013-12-15 LAB — CBC
HCT: 35.8 % — ABNORMAL LOW (ref 39.0–52.0)
Hemoglobin: 12.1 g/dL — ABNORMAL LOW (ref 13.0–17.0)
MCH: 31.4 pg (ref 26.0–34.0)
MCHC: 33.8 g/dL (ref 30.0–36.0)
MCV: 93 fL (ref 78.0–100.0)
Platelets: 171 10*3/uL (ref 150–400)
RBC: 3.85 MIL/uL — ABNORMAL LOW (ref 4.22–5.81)
RDW: 13.7 % (ref 11.5–15.5)
WBC: 6.5 10*3/uL (ref 4.0–10.5)

## 2013-12-15 LAB — HEPARIN LEVEL (UNFRACTIONATED)
Heparin Unfractionated: 0.54 IU/mL (ref 0.30–0.70)
Heparin Unfractionated: 0.64 IU/mL (ref 0.30–0.70)

## 2013-12-15 LAB — TROPONIN I: Troponin I: <0.3 ng/mL (ref <0.30)

## 2013-12-15 MED ORDER — REGADENOSON 0.4 MG/5ML IV SOLN
0.4000 mg | Freq: Once | INTRAVENOUS | Status: AC
  Administered 2013-12-16: 0.4 mg via INTRAVENOUS
  Filled 2013-12-15: qty 5

## 2013-12-15 NOTE — Care Management Utilization Note (Signed)
UR complete    [name] Saathoff,MSN,RN [phone] 

## 2013-12-15 NOTE — Progress Notes (Signed)
ANTICOAGULATION CONSULT NOTE - Follow Up Consult  Pharmacy Consult for Heparin Indication: chest pain/ACS  Allergies  Allergen Reactions  . Iohexol Shortness Of Breath and Itching    Pt was given 100cc of Omnipaque 300 followed by itching/ dyspnea.  . Budesonide-Formoterol Fumarate     REACTION: Mouth and tongue swelling  . Demerol [Meperidine] Other (See Comments)    Hallucinations  . Ivp Dye [Iodinated Diagnostic Agents]     Iodine pt had a reaction when he had a CT DONE  . Meperidine Hcl     Hallucinations  . Naproxen Swelling  . Simvastatin   . Pregabalin Rash  . Sulfonamide Derivatives Rash  Patient Measurements: Height: 5\' 10"  (177.8 cm) Weight: 175 lb 7.7 oz (79.599 kg) IBW/kg (Calculated) : 73 Heparin Dosing Weight: 78kg Vital Signs: Temp: 98.3 F (36.8 C) (03/01 0400) Temp src: Oral (03/01 0400) BP: 142/87 mmHg (03/01 0916) Pulse Rate: 59 (03/01 0916) Labs:  Recent Labs  12/14/13 1110 12/14/13 1800 12/14/13 2241 12/15/13 0140 12/15/13 1000  HGB 13.0  --   --  12.1*  --   HCT 39.0  --   --  35.8*  --   PLT 183  --   --  171  --   HEPARINUNFRC  --   --   --  0.54 0.64  CREATININE 0.92  --   --   --   --   TROPONINI  --  <0.30 <0.30 <0.30  --    Estimated Creatinine Clearance: 66.1 ml/min (by C-G formula based on Cr of 0.92).   Medications:  Heparin at 1000 units/hr  Assessment: 82 YOM on IV heparin for r/o ACS. Heparin level is therapeutic x2 but trending up. Troponins are negative x3. H/H mildly decreased, but platelets are stable. No bleeding reported.   Goal of Therapy:  Heparin level 0.3-0.7 units/ml Monitor platelets by anticoagulation protocol: Yes   Plan:  1. Due to concern for accumulation, will slightly decrease heparin to 950 units/hr. 2. Follow-up daily level and CBC.   Sloan Leiter, PharmD, BCPS Clinical Pharmacist [phone] 12/15/2013,11:30 AM

## 2013-12-15 NOTE — Progress Notes (Signed)
ANTICOAGULATION CONSULT NOTE - Follow Up Consult  Pharmacy Consult for heparin Indication: chest pain/ACS   Labs:  Recent Labs  12/14/13 1110 12/14/13 1800 12/14/13 2241 12/15/13 0140  HGB 13.0  --   --  12.1*  HCT 39.0  --   --  35.8*  PLT 183  --   --  171  HEPARINUNFRC  --   --   --  0.54  CREATININE 0.92  --   --   --   TROPONINI  --  <0.30 <0.30 <0.30     Assessment/Plan:  80yo male therapeutic on heparin with initial dosing for CP.  Will continue gtt at current rate and confirm stable with additional level.  Wynona Neat, PharmD, BCPS  12/15/2013,2:57 AM

## 2013-12-15 NOTE — Progress Notes (Addendum)
Patient ID: [name], male   DOB: November 25, 1930, 80 y.o.   MRN: [id number]     Subjective:    No pain overnight  Objective:   Temp:  [97.6 F (36.4 C)-98.3 F (36.8 C)] 98.3 F (36.8 C) (03/01 0400) Pulse Rate:  [61-80] 67 (03/01 0400) Resp:  [11-18] 18 (03/01 0400) BP: (116-157)/(64-90) 122/64 mmHg (03/01 0400) SpO2:  [97 %-100 %] 98 % (03/01 0400) Weight:  [175 lb 7.7 oz (79.599 kg)-175 lb 14.4 oz (79.788 kg)] 175 lb 7.7 oz (79.599 kg) (03/01 0400) Last BM Date: 12/14/13  Filed Weights   12/14/13 1730 12/15/13 0400  Weight: 175 lb 14.4 oz (79.788 kg) 175 lb 7.7 oz (79.599 kg)    Intake/Output Summary (Last 24 hours) at 12/15/13 0902 Last data filed at 12/15/13 0450  Gross per 24 hour  Intake    570 ml  Output    850 ml  Net   -280 ml    Telemetry: NSR, PVCs  Exam:  General: NAD  Resp: CTAB  Cardiac: RRR, no m/r/g, no JVD, no carotid bruits  GI: abdomen soft, NT, ND  MSK: Lower extremities are warm, no edema  Neuro: no focal deficits    Lab Results:  Basic Metabolic Panel:  Recent Labs Lab 12/14/13 1110  NA 142  K 4.1  CL 103  CO2 28  GLUCOSE 98  BUN 9  CREATININE 0.92  CALCIUM 9.4    Liver Function Tests: No results found for this basename: AST, ALT, ALKPHOS, BILITOT, PROT, ALBUMIN,  in the last 168 hours  CBC:  Recent Labs Lab 12/14/13 1110 12/15/13 0140  WBC 6.5 6.5  HGB 13.0 12.1*  HCT 39.0 35.8*  MCV 92.9 93.0  PLT 183 171    Cardiac Enzymes:  Recent Labs Lab 12/14/13 1800 12/14/13 2241 12/15/13 0140  TROPONINI <0.30 <0.30 <0.30    BNP:  Recent Labs  12/14/13 1110  PROBNP 729.0*    Coagulation: No results found for this basename: INR,  in the last 168 hours  ECG:   Medications:   Scheduled Medications: . amLODipine  5 mg Oral Daily  . aspirin EC  81 mg Oral Daily  . atorvastatin  40 mg Oral q1800  . cholecalciferol  5,000 Units Oral Daily  . clopidogrel  75 mg Oral Daily  . colestipol  1 g Oral  BID  . doxazosin  4 mg Oral QHS  . fluticasone  1 puff Inhalation BID  . isosorbide mononitrate  60 mg Oral Daily  . metoprolol tartrate  25 mg Oral BID  . pantoprazole  40 mg Oral BID  . potassium chloride  10 mEq Oral Daily  . vitamin B-12  1,000 mcg Oral Daily     Infusions: . heparin 1,000 Units/hr (12/14/13 1658)     PRN Medications:  acetaminophen, albuterol, furosemide, morphine injection, nitroGLYCERIN, ondansetron (ZOFRAN) IV     Assessment/Plan    1. Chest pain  - history of chronic chest pain with multiple caths and interventions (>15 caths per patient report, at least 14 in our system. 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 Would anticipate if 5 hours of ischemia troponins would be positive  - difficult assessment in patient with intermittent chronic chest pain, with history of real disease and prior interventions. Last MPI was low risk but did have some peri-infarct ischemia - no  evidence of ACS at this time, negative troponins and EKG witout specific ischemic changes - will f/u up echo results. Pending results, likely repeat stress MPI (it has been nearly a year and patient with new/more severe symptoms) to further risk stratify his known disease. If low risk continue medical therapy.   Carlyle Dolly, M.D., F.A.C.C.  Addendum 12/15/13 300pm Echo reviewed, LVEF remains normal. Technically difficult study, cannot fully assess wall motion. Last MPI approx 1 year ago, however in setting of progression of chest pain in patient with known CAD with risk stratify further with a Lexiscan in AM. If remains low risk, continue medical therapy. Will continue heparin for now.    Carlyle Dolly MD

## 2013-12-15 NOTE — Progress Notes (Signed)
At 1943 pt had 8 beats of multifocal Vtach. Pt asymptomatic. BP 118/72. Dr. Claiborne Billings notified and orders received. Will cont to monitor.

## 2013-12-15 NOTE — Progress Notes (Signed)
  Echocardiogram 2D Echocardiogram has been performed.  Basilia Jumbo 12/15/2013, 10:32 AM

## 2013-12-16 ENCOUNTER — Inpatient Hospital Stay (HOSPITAL_COMMUNITY): Payer: Medicare Other

## 2013-12-16 DIAGNOSIS — R079 Chest pain, unspecified: Secondary | ICD-10-CM

## 2013-12-16 LAB — HEPARIN LEVEL (UNFRACTIONATED)
Heparin Unfractionated: 0.83 IU/mL — ABNORMAL HIGH (ref 0.30–0.70)
Heparin Unfractionated: 0.34 IU/mL (ref 0.30–0.70)

## 2013-12-16 LAB — CBC
HCT: 37.6 % — ABNORMAL LOW (ref 39.0–52.0)
HEMOGLOBIN: 12.6 g/dL — AB (ref 13.0–17.0)
MCH: 31.3 pg (ref 26.0–34.0)
MCHC: 33.5 g/dL (ref 30.0–36.0)
MCV: 93.3 fL (ref 78.0–100.0)
Platelets: 177 10*3/uL (ref 150–400)
RBC: 4.03 MIL/uL — AB (ref 4.22–5.81)
RDW: 13.9 % (ref 11.5–15.5)
WBC: 6.8 10*3/uL (ref 4.0–10.5)

## 2013-12-16 LAB — MAGNESIUM: Magnesium: 2 mg/dL (ref 1.5–2.5)

## 2013-12-16 MED ORDER — REGADENOSON 0.4 MG/5ML IV SOLN
INTRAVENOUS | Status: AC
  Administered 2013-12-16: 0.4 mg via INTRAVENOUS
  Filled 2013-12-16: qty 5

## 2013-12-16 MED ORDER — ACETAMINOPHEN 325 MG PO TABS: 650.0000 mg | ORAL_TABLET | ORAL | Status: DC | PRN

## 2013-12-16 MED ORDER — TECHNETIUM TC 99M SESTAMIBI GENERIC - CARDIOLITE
10.0000 | Freq: Once | INTRAVENOUS | Status: AC | PRN
  Administered 2013-12-16: 10 via INTRAVENOUS

## 2013-12-16 MED ORDER — TECHNETIUM TC 99M SESTAMIBI GENERIC - CARDIOLITE
30.0000 | Freq: Once | INTRAVENOUS | Status: AC | PRN
Start: 2013-12-16 — End: 2013-12-16
  Administered 2013-12-16: 30 via INTRAVENOUS

## 2013-12-16 MED ORDER — METOPROLOL TARTRATE 25 MG PO TABS
25.0000 mg | ORAL_TABLET | Freq: Two times a day (BID) | ORAL | Status: DC
  Administered 2013-12-16: 25 mg via ORAL
  Filled 2013-12-16 (×2): qty 1

## 2013-12-16 NOTE — Discharge Instructions (Signed)
Chest Pain (Nonspecific) °It is often hard to give a specific diagnosis for the cause of chest pain. There is always a chance that your pain could be related to something serious, such as a heart attack or a blood clot in the lungs. You need to follow up with your caregiver for further evaluation. °CAUSES  °· Heartburn. °· Pneumonia or bronchitis. °· Anxiety or stress. °· Inflammation around your heart (pericarditis) or lung (pleuritis or pleurisy). °· A blood clot in the lung. °· A collapsed lung (pneumothorax). It can develop suddenly on its own (spontaneous pneumothorax) or from injury (trauma) to the chest. °· Shingles infection (herpes zoster virus). °The chest wall is composed of bones, muscles, and cartilage. Any of these can be the source of the pain. °· The bones can be bruised by injury. °· The muscles or cartilage can be strained by coughing or overwork. °· The cartilage can be affected by inflammation and become sore (costochondritis). °DIAGNOSIS  °Lab tests or other studies, such as X-rays, electrocardiography, stress testing, or cardiac imaging, may be needed to find the cause of your pain.  °TREATMENT  °· Treatment depends on what may be causing your chest pain. Treatment may include: °· Acid blockers for heartburn. °· Anti-inflammatory medicine. °· Pain medicine for inflammatory conditions. °· Antibiotics if an infection is present. °· You may be advised to change lifestyle habits. This includes stopping smoking and avoiding alcohol, caffeine, and chocolate. °· You may be advised to keep your head raised (elevated) when sleeping. This reduces the chance of acid going backward from your stomach into your esophagus. °· Most of the time, nonspecific chest pain will improve within 2 to 3 days with rest and mild pain medicine. °HOME CARE INSTRUCTIONS  °· If antibiotics were prescribed, take your antibiotics as directed. Finish them even if you start to feel better. °· For the next few days, avoid physical  activities that bring on chest pain. Continue physical activities as directed. °· Do not smoke. °· Avoid drinking alcohol. °· Only take over-the-counter or prescription medicine for pain, discomfort, or fever as directed by your caregiver. °· Follow your caregiver's suggestions for further testing if your chest pain does not go away. °· Keep any follow-up appointments you made. If you do not go to an appointment, you could develop lasting (chronic) problems with pain. If there is any problem keeping an appointment, you must call to reschedule. °SEEK MEDICAL CARE IF:  °· You think you are having problems from the medicine you are taking. Read your medicine instructions carefully. °· Your chest pain does not go away, even after treatment. °· You develop a rash with blisters on your chest. °SEEK IMMEDIATE MEDICAL CARE IF:  °· You have increased chest pain or pain that spreads to your arm, neck, jaw, back, or abdomen. °· You develop shortness of breath, an increasing cough, or you are coughing up blood. °· You have severe back or abdominal pain, feel nauseous, or vomit. °· You develop severe weakness, fainting, or chills. °· You have a fever. °THIS IS AN EMERGENCY. Do not wait to see if the pain will go away. Get medical help at once. Call your local emergency services (911 in [place] Do not drive yourself to the hospital. °MAKE SURE YOU:  °· Understand these instructions. °· Will watch your condition. °· Will get help right away if you are not doing well or get worse. °Document Released: 07/13/2005 Document Revised: 12/26/2011 Document Reviewed: 05/08/2008 °[employer]® Patient Information ©2014 [employer]   [employer]. ° °

## 2013-12-16 NOTE — Progress Notes (Signed)
D/c order received, IV removed with gauze on, pt remains in stable condition, pt meds and instructions reviewed and given to pt; pt d/c to home

## 2013-12-16 NOTE — Progress Notes (Signed)
Primary cardiologist: Dr Dorris Carnes  Subjective: Denies CP/ SOB.  Objective: Vital signs in last 24 hours: Temp:  [98.3 F (36.8 C)-99.9 F (37.7 C)] 98.3 F (36.8 C) (03/02 0605) Pulse Rate:  [59-73] 59 (03/02 0605) Resp:  [18-20] 18 (03/02 0605) BP: (107-151)/(60-83) 151/80 mmHg (03/02 0927) SpO2:  [98 %] 98 % (03/02 0605) Last BM Date: 12/14/13  Intake/Output from previous day: 03/01 0701 - 03/02 0700 In: 584 [P.O.:480; I.V.:104] Out: 2000 [Urine:2000] Intake/Output this shift:    Medications Current Facility-Administered Medications  Medication Dose Route Frequency Provider Last Rate Last Dose  . acetaminophen (TYLENOL) tablet 650 mg  650 mg Oral Q4H PRN Arnoldo Lenis, MD      . albuterol (PROVENTIL) (2.5 MG/3ML) 0.083% nebulizer solution 2.5 mg  2.5 mg Nebulization BID PRN Arnoldo Lenis, MD      . amLODipine (NORVASC) tablet 5 mg  5 mg Oral Daily Arnoldo Lenis, MD   5 mg at 12/15/13 9892  . aspirin EC tablet 81 mg  81 mg Oral Daily Arnoldo Lenis, MD   81 mg at 12/15/13 1194  . atorvastatin (LIPITOR) tablet 40 mg  40 mg Oral q1800 Arnoldo Lenis, MD   40 mg at 12/15/13 1833  . cholecalciferol (VITAMIN D) tablet 5,000 Units  5,000 Units Oral Daily Arnoldo Lenis, MD   5,000 Units at 12/15/13 [phone]  . clopidogrel (PLAVIX) tablet 75 mg  75 mg Oral Daily Arnoldo Lenis, MD   75 mg at 12/15/13 0916  . colestipol (COLESTID) tablet 1 g  1 g Oral BID Arnoldo Lenis, MD   1 g at 12/15/13 2135  . doxazosin (CARDURA) tablet 4 mg  4 mg Oral QHS Arnoldo Lenis, MD   4 mg at 12/15/13 2135  . fluticasone (FLOVENT HFA) 220 MCG/ACT inhaler 1 puff  1 puff Inhalation BID Arnoldo Lenis, MD   1 puff at 12/15/13 2103  . furosemide (LASIX) tablet 40 mg  40 mg Oral Daily PRN Arnoldo Lenis, MD      . heparin ADULT infusion 100 units/mL (25000 units/250 mL)  800 Units/hr Intravenous Continuous Rogue Bussing, [facility] 8 mL/hr at 12/16/13 0619 800 Units/hr at  12/16/13 8144  . isosorbide mononitrate (IMDUR) 24 hr tablet 60 mg  60 mg Oral Daily Arnoldo Lenis, MD   60 mg at 12/15/13 0917  . metoprolol tartrate (LOPRESSOR) tablet 25 mg  25 mg Oral BID Doreene Burke Kilroy, PA-C      . morphine 2 MG/ML injection 2 mg  2 mg Intravenous Q2H PRN Arnoldo Lenis, MD      . nitroGLYCERIN (NITROSTAT) SL tablet 0.4 mg  0.4 mg Sublingual Q5 min PRN Arnoldo Lenis, MD      . ondansetron [facility]) injection 4 mg  4 mg Intravenous Q6H PRN Arnoldo Lenis, MD   4 mg at 12/14/13 1827  . pantoprazole (PROTONIX) EC tablet 40 mg  40 mg Oral BID Arnoldo Lenis, MD   40 mg at 12/15/13 2135  . potassium chloride (K-DUR,KLOR-CON) CR tablet 10 mEq  10 mEq Oral Daily Arnoldo Lenis, MD   10 mEq at 12/15/13 0917  . vitamin B-12 (CYANOCOBALAMIN) tablet 1,000 mcg  1,000 mcg Oral Daily Arnoldo Lenis, MD   1,000 mcg at 12/15/13 8185    PE: General appearance: alert, cooperative and no distress Lungs: clear to auscultation bilaterally Heart: regular rate and rhythm Extremities: no LEE  Pulses: 2+ and symmetric Skin: warm and dry Neurologic: Grossly normal  Lab Results:   Recent Labs  12/14/13 1110 12/15/13 0140 12/16/13 0437  WBC 6.5 6.5 6.8  HGB 13.0 12.1* 12.6*  HCT 39.0 35.8* 37.6*  PLT 183 171 177   BMET  Recent Labs  12/14/13 1110  NA 142  K 4.1  CL 103  CO2 28  GLUCOSE 98  BUN 9  CREATININE 0.92  CALCIUM 9.4   Cardiac Panel (last 3 results)  Recent Labs  12/14/13 1800 12/14/13 2241 12/15/13 0140  TROPONINI <0.30 <0.30 <0.30     Assessment/Plan    Active Problems:   Chest pain  Plan: Enzymes negative x 3. Denies further chest pain and SOB. Lexiscan completed today. No ischemic changes, but PVCs were noted. He had mild mid back pain, SOB and nausea after infusion of Lexiscan. Symptoms resolved by completion of test. Radiologist interpretation to follow. If normal, plan for discharge today. If abnormal, plan of LHC in the am.  MD to follow.     LOS: 2 days    [name] 12/16/2013 10:17 AM Personally seen and examined. Agree with above.  CAD: last cath June 2012 LM patent, LAD 50% mid, diag 80% ostial, LCX 30%, RCA 25% proximal with patent stents. The ostial diagonal lesion was opened with a cutting balloon, this lesion has been intervened on multiple times. Recs from that cath were to consider non-invasive stress testing prior to future caths to varify ischemia, unclear there is much left to be done on the diagonal lesion.  - Myoview 01/2013 low risk, medium sized partially reversible basal to mid inferior defect consistent with prior infarct and peri-infarct ischemia, LVEF 61%.   Back from NUC stress. In general feels weak over the past several days. He has had 17 heart catheterizations he states.   Lungs: bilateral exp wheeze (has inhalers at home)  ECHO reassuring.   If NUC low risk, DC home.   Candee Furbish, MD

## 2013-12-16 NOTE — Progress Notes (Signed)
ANTICOAGULATION CONSULT NOTE - Follow Up Consult  Pharmacy Consult for heparin Indication: chest pain/ACS   Labs:  Recent Labs  12/14/13 1110 12/14/13 1800 12/14/13 2241 12/15/13 0140 12/15/13 1000 12/16/13 0437  HGB 13.0  --   --  12.1*  --  12.6*  HCT 39.0  --   --  35.8*  --  37.6*  PLT 183  --   --  171  --  177  HEPARINUNFRC  --   --   --  0.54 0.64 0.83*  CREATININE 0.92  --   --   --   --   --   TROPONINI  --  <0.30 <0.30 <0.30  --   --     Assessment: 79yo male now supratherapeutic on heparin despite rate decrease.  Goal of Therapy:  Heparin level 0.3-0.7 units/ml   Plan:  Will decrease heparin gtt by 2 units/kg/hr to 800 units/hr and check level in [place], PharmD, BCPS  12/16/2013,5:52 AM

## 2013-12-16 NOTE — Progress Notes (Signed)
ANTICOAGULATION CONSULT NOTE - Follow Up Consult  Pharmacy Consult for heparin Indication: chest pain/ACS   Labs:  Recent Labs  12/14/13 1110 12/14/13 1800 12/14/13 2241  12/15/13 0140 12/15/13 1000 12/16/13 0437 12/16/13 1410  HGB 13.0  --   --   --  12.1*  --  12.6*  --   HCT 39.0  --   --   --  35.8*  --  37.6*  --   PLT 183  --   --   --  171  --  177  --   HEPARINUNFRC  --   --   --   < > 0.54 0.64 0.83* 0.34  CREATININE 0.92  --   --   --   --   --   --   --   TROPONINI  --  <0.30 <0.30  --  <0.30  --   --   --   < > = values in this interval not displayed.  Assessment: 80yo male now therapeutic on heparin after rate adjustment this AM  Goal of Therapy:  Heparin level 0.3-0.7 units/ml   Plan:  1) Continue heparin at 800 units / hr 2) Follow up AM level  Thank you. Anette Guarneri, PharmD [phone] . 12/16/2013,3:01 PM

## 2013-12-20 DIAGNOSIS — I10 Essential (primary) hypertension: Secondary | ICD-10-CM | POA: Diagnosis present

## 2013-12-20 NOTE — Discharge Summary (Signed)
Patient ID: [name],  MRN: [id number], DOB/AGE: 1931/05/21 80 y.o.  Admit date: 12/14/2013 Discharge date: 12/14/2013  Primary Care Provider:  Primary Cardiologist: Dr Harrington Challenger  Discharge Diagnoses Principal Problem:   Chest pain with moderate risk of acute coronary syndrome Active Problems:   CAD- RCA BMS '02, Dx HSRA June 2012   HYPERLIPIDEMIA-MIXED   HTN (hypertension)    Procedures: Cherlyn Cushing 12/14/13   Hospital Course: [name] is a 80 y.o.male hx of CAD with prior RCA BMS and Dx HSRA presented to the ER at [facility] 12/14/13 with chest pain worrisome for [place]. .  Patient reports episode of left sided chest and arm pain that awoke him from sleep. Constant x 5 hours, 8/10 dull pain with assoc SOB and nausea. Worst with deep breaths. Took NG x 2 without any benefit. Came to ER, symptoms improved with IV morphine.  Prior notes indicate a history of chronic chest pain, multiple cath procedures in setting of chest pain. CAD: last cath June 2012 LM patent, LAD 50% mid, diag 80% ostial, LCX 30%, RCA 25% proximal with patent stents. The ostial diagonal lesion was opened with a cutting balloon, this lesion has been intervened on multiple times. Recs from that cath were to consider non-invasive stress testing prior to future caths to varify ischemia, unclear there is much left to be done on the diagonal lesion.  - Myoview 01/2013 low risk, medium sized partially reversible basal to mid inferior defect consistent with prior infarct and peri-infarct ischemia, LVEF 61%.       The pt ruled out for an MI. Lexiscan showed moderate inferior wall infarct from apex to base with no ischemia EF 47%. He was discharged later on the 1st and will follow up in the clinic as an OP.     Discharge Vitals:  Blood pressure 140/77, pulse 77, temperature 98.3 F (36.8 C), temperature source Oral, resp. rate 18, height 5\' 10"  (1.778 m), weight 175 lb 7.7 oz (79.599 kg), SpO2 98.00%.    Labs: No  results found for this or any previous visit (from the past 48 hour(s)).  Disposition:      Follow-up Information   Follow up with Dorris Carnes, MD On 01/03/2014. (4:15pm)    Specialty:  Cardiology   Contact information:   [place] [id number] [phone]       Discharge Medications:    Medication List         acetaminophen 325 MG tablet  Commonly known as:  TYLENOL  Take 2 tablets (650 mg total) by mouth every 4 (four) hours as needed for headache or mild pain.     albuterol 108 (90 BASE) MCG/ACT inhaler  Commonly known as:  PROVENTIL HFA;VENTOLIN HFA  Inhale 2 puffs into the lungs 2 (two) times daily as needed for wheezing or shortness of breath.     amLODipine 5 MG tablet  Commonly known as:  NORVASC  Take 5 mg by mouth daily.     aspirin EC 81 MG tablet  Take 81 mg by mouth daily.     atorvastatin 40 MG tablet  Commonly known as:  LIPITOR  Take 40 mg by mouth daily.     cholecalciferol 1000 UNITS tablet  Commonly known as:  VITAMIN D  Take 5,000 Units by mouth daily.     clopidogrel 75 MG tablet  Commonly known as:  PLAVIX  Take 1 tablet (75 mg total) by mouth daily.  colestipol 1 G tablet  Commonly known as:  COLESTID  Take 1 g by mouth 2 (two) times daily.     doxazosin 4 MG tablet  Commonly known as:  CARDURA  Take 1 tablet (4 mg total) by mouth at bedtime.     fluocinonide gel 0.05 %  Commonly known as:  LIDEX  Apply 1 application topically as needed (insect bites).     fluticasone 220 MCG/ACT inhaler  Commonly known as:  FLOVENT HFA  Inhale 1 puff into the lungs 2 (two) times daily.     furosemide 40 MG tablet  Commonly known as:  LASIX  Take 40 mg by mouth daily as needed for edema.     isosorbide mononitrate 60 MG 24 hr tablet  Commonly known as:  IMDUR  Take 60 mg by mouth daily.     metoprolol tartrate 25 MG tablet  Commonly known as:  LOPRESSOR  Take 1 tablet (25 mg total) by mouth 2 (two) times  daily.     nitroGLYCERIN 0.4 MG SL tablet  Commonly known as:  NITROSTAT  Place 1 tablet (0.4 mg total) under the tongue every 5 (five) minutes as needed.     pantoprazole 40 MG tablet  Commonly known as:  PROTONIX  Take 1 tablet (40 mg total) by mouth 2 (two) times daily.     potassium chloride 10 MEQ tablet  Commonly known as:  K-DUR,KLOR-CON  Take 10 mEq by mouth daily.     tamsulosin 0.4 MG Caps capsule  Commonly known as:  FLOMAX  Take 0.4 mg by mouth daily.     vitamin B-12 1000 MCG tablet  Commonly known as:  CYANOCOBALAMIN  Take 1,000 mcg by mouth daily.         Duration of Discharge Encounter: Greater than 30 minutes including physician time.  Angelena Form PA-C 12/20/2013 2:03 PM

## 2013-12-20 NOTE — Discharge Summary (Signed)
Patient seen and examined, agree with above.

## 2014-01-03 ENCOUNTER — Encounter: Payer: Self-pay | Admitting: *Deleted

## 2014-01-03 ENCOUNTER — Encounter: Payer: Self-pay | Admitting: Internal Medicine

## 2014-01-03 ENCOUNTER — Ambulatory Visit (INDEPENDENT_AMBULATORY_CARE_PROVIDER_SITE_OTHER): Payer: Medicare Other | Admitting: Internal Medicine

## 2014-01-03 VITALS — BP 144/66 | HR 55 | Ht 70.0 in | Wt 180.0 lb

## 2014-01-03 DIAGNOSIS — R079 Chest pain, unspecified: Secondary | ICD-10-CM

## 2014-01-03 DIAGNOSIS — Z01812 Encounter for preprocedural laboratory examination: Secondary | ICD-10-CM

## 2014-01-03 DIAGNOSIS — I10 Essential (primary) hypertension: Secondary | ICD-10-CM

## 2014-01-03 DIAGNOSIS — I2510 Atherosclerotic heart disease of native coronary artery without angina pectoris: Secondary | ICD-10-CM

## 2014-01-03 LAB — CBC WITH DIFFERENTIAL/PLATELET
BASOS ABS: 0 10*3/uL (ref 0.0–0.1)
BASOS PCT: 0 % (ref 0–1)
EOS PCT: 1 % (ref 0–5)
Eosinophils Absolute: 0.1 10*3/uL (ref 0.0–0.7)
HEMATOCRIT: 40 % (ref 39.0–52.0)
Hemoglobin: 13.4 g/dL (ref 13.0–17.0)
Lymphocytes Relative: 27 % (ref 12–46)
Lymphs Abs: 1.8 10*3/uL (ref 0.7–4.0)
MCH: 30.2 pg (ref 26.0–34.0)
MCHC: 33.5 g/dL (ref 30.0–36.0)
MCV: 90.3 fL (ref 78.0–100.0)
MONO ABS: 0.7 10*3/uL (ref 0.1–1.0)
Monocytes Relative: 10 % (ref 3–12)
NEUTROS ABS: 4.2 10*3/uL (ref 1.7–7.7)
Neutrophils Relative %: 62 % (ref 43–77)
PLATELETS: 237 10*3/uL (ref 150–400)
RBC: 4.43 MIL/uL (ref 4.22–5.81)
RDW: 14.2 % (ref 11.5–15.5)
WBC: 6.8 10*3/uL (ref 4.0–10.5)

## 2014-01-03 MED ORDER — PREDNISONE 20 MG PO TABS: ORAL_TABLET | ORAL | Status: DC

## 2014-01-03 NOTE — Patient Instructions (Signed)
Your physician has requested that you have a cardiac catheterization. Cardiac catheterization is used to diagnose and/or treat various heart conditions. Doctors may recommend this procedure for a number of different reasons. The most common reason is to evaluate chest pain. Chest pain can be a symptom of coronary artery disease (CAD), and cardiac catheterization can show whether plaque is narrowing or blocking your heart's arteries. This procedure is also used to evaluate the valves, as well as measure the blood flow and oxygen levels in different parts of your heart. For further information please visit [email or web address]. Please follow instruction sheet, as given.  Your physician recommends that you return for lab work in: TODAY.  (CBC, BMP, INR)  Your physician has recommended you make the following change in your medication:  STOP PROTONIX

## 2014-01-03 NOTE — Progress Notes (Signed)
ID   Patient is an 80 yo with known CAD  I saw him last in the fall 2014.  His last cath was in 2012 when he underwent cutting balloon angioplasty [name]) of diagonal (repeat intervention)  Mild disease elsewhere.   SInce I saw him, he was admitted to [facility] a few wks ago.  He was having CP  Felt at time to be more pleuritic  R/O for MI  Myoview was done that showed no ischemia Since d/c though he has continued to have CP with exertion.  Today he says it is not pleuritic.   Current Outpatient Prescriptions on File Prior to Visit  Medication Sig Dispense Refill  . acetaminophen (TYLENOL) 325 MG tablet Take 2 tablets (650 mg total) by mouth every 4 (four) hours as needed for headache or mild pain.      Marland Kitchen albuterol (PROVENTIL HFA;VENTOLIN HFA) 108 (90 BASE) MCG/ACT inhaler Inhale 2 puffs into the lungs 2 (two) times daily as needed for wheezing or shortness of breath.      Marland Kitchen amLODipine (NORVASC) 5 MG tablet Take 5 mg by mouth daily.      Marland Kitchen aspirin EC 81 MG tablet Take 81 mg by mouth daily.      Marland Kitchen atorvastatin (LIPITOR) 40 MG tablet Take 40 mg by mouth daily.      . cholecalciferol (VITAMIN D) 1000 UNITS tablet Take 5,000 Units by mouth daily.       . clopidogrel (PLAVIX) 75 MG tablet Take 1 tablet (75 mg total) by mouth daily.  90 tablet  3  . colestipol (COLESTID) 1 G tablet Take 1 g by mouth 2 (two) times daily.      Marland Kitchen doxazosin (CARDURA) 4 MG tablet Take 1 tablet (4 mg total) by mouth at bedtime.  90 tablet  3  . fluocinonide gel (LIDEX) 8.52 % Apply 1 application topically as needed (insect bites).      . fluticasone (FLOVENT HFA) 220 MCG/ACT inhaler Inhale 1 puff into the lungs 2 (two) times daily.      . furosemide (LASIX) 40 MG tablet Take 40 mg by mouth daily as needed for edema.      . isosorbide mononitrate (IMDUR) 60 MG 24 hr tablet Take 60 mg by mouth daily.      . metoprolol tartrate (LOPRESSOR) 25 MG tablet Take 1 tablet (25 mg total) by mouth 2 (two) times daily.  180  tablet  3  . nitroGLYCERIN (NITROSTAT) 0.4 MG SL tablet Place 1 tablet (0.4 mg total) under the tongue every 5 (five) minutes as needed.  25 tablet  6  . pantoprazole (PROTONIX) 40 MG tablet Take 1 tablet (40 mg total) by mouth 2 (two) times daily.  180 tablet  3  . potassium chloride (K-DUR,KLOR-CON) 10 MEQ tablet Take 10 mEq by mouth daily.      . Tamsulosin HCl (FLOMAX) 0.4 MG CAPS Take 0.4 mg by mouth daily.      . vitamin B-12 (CYANOCOBALAMIN) 1000 MCG tablet Take 1,000 mcg by mouth daily.       No current facility-administered medications on file prior to visit.    Allergies  Allergen Reactions  . Iohexol Shortness Of Breath and Itching    Pt was given 100cc of Omnipaque 300 followed by itching/ dyspnea.  . Budesonide-Formoterol Fumarate     REACTION: Mouth and tongue swelling  . Demerol [Meperidine] Other (See Comments)    Hallucinations  . Ivp Dye [Iodinated Diagnostic Agents]  Iodine pt had a reaction when he had a CT DONE  . Meperidine Hcl     Hallucinations  . Naproxen Swelling  . Simvastatin   . Pregabalin Rash  . Sulfonamide Derivatives Rash    Past Medical History  Diagnosis Date  . Hypertension   . Hyperlipidemia   . Chronic chest pain   . Dyspnea     chronic  . GERD (gastroesophageal reflux disease)     h/o esophageal spasm  . Asthma   . Back pain   . Coronary artery disease     a. s/p BMS to RCA in 2002; b. s/p cutting balloon POBA ;   c. cath 6/12: oDx 80% (treated with repeat cutting balloon POBA), mLAD 50% with 30-40% at Dx, CFX 30%, pRCA 25% with patent stents;  d.  Lex MV 4/14:  Low Risk - EF 61%, inf scar with peri-infarct ischemia  . Myocardial infarction     Past Surgical History  Procedure Laterality Date  . Cardiac catheterization    . Neck surgery      multiple  . Back surgery      multiple  . Laminectomy    . Posterior laminectomy / decompression cervical spine    . Anterior cervical decomp/discectomy fusion    . Coronary  angioplasty with stent placement       previous percutaneous intervention on the  RCA and the diagonal branch  . Esophagogastroduodenoscopy  03/23/2012    Procedure: ESOPHAGOGASTRODUODENOSCOPY (EGD);  Surgeon: Winfield Cunas., MD;  Location: [facility];  Service: Endoscopy;  Laterality: N/A;  . Savory dilation  03/23/2012    Procedure: SAVORY DILATION;  Surgeon: Winfield Cunas., MD;  Location: [facility];  Service: Endoscopy;  Laterality: N/A;  . Tonsillectomy    . Eye surgery      History  Smoking status  . Never Smoker   Smokeless tobacco  . Not on file    History  Alcohol Use No    Family History  Problem Relation Age of Onset  . Diabetes Father   . Heart disease Father   . Asthma Father   . Heart disease Mother     CABG hx age 63  . Colon cancer Son     hx   . Colitis Son     hx  . Crohn's disease Son     Review of Systems: The review of systems is per the HPI.  All other systems were reviewed and are negative.  Physical Exam: BP 144/66  Pulse 55  Ht 5\' 10"  (1.778 m)  Wt 180 lb (81.647 kg)  BMI 25.83 kg/m2  SpO2 97% Patient is very pleasant and in no acute distress. Skin is warm and dry. Color is normal.  HEENT is unremarkable except for very poor dentition. Normocephalic/atraumatic. PERRL. Sclera are nonicteric. Neck is supple. No masses. No JVD. Lungs are clear. Cardiac exam shows a regular rate and rhythm. Abdomen is soft. Extremities are without edema. Gait and ROM are intact. No gross neurologic deficits noted.  LABORATORY DATA: n/a  Lab Results  Component Value Date   WBC 6.8 12/16/2013   HGB 12.6* 12/16/2013   HCT 37.6* 12/16/2013   PLT 177 12/16/2013   GLUCOSE 98 12/14/2013   CHOL 169 07/08/2013   TRIG 98.0 07/08/2013   HDL 56.40 07/08/2013   LDLCALC 93 07/08/2013   ALT 15 07/08/2013   AST 20 07/08/2013   NA 142 12/14/2013   K 4.1 12/14/2013  CL 103 12/14/2013   CREATININE 0.92 12/14/2013   BUN 9 12/14/2013   CO2 28 12/14/2013   TSH 1.12 01/07/2013    PSA 5.73* 01/07/2013   INR 1.12 04/14/2012   Myoview Impression  Exercise Capacity: Lexiscan with no exercise.  BP Response: Normal blood pressure response.  Clinical Symptoms: Chest pain.  ECG Impression: No significant ST segment change suggestive of ischemia.  Comparison with Prior Nuclear Study: Prior study showed no significant defect.   Overall Impression: Low risk stress nuclear study. There was a medium-sized, mild basal to mid inferior perfusion defect that was partially reversible. This suggests prior inferior infarction with peri-infarct ischemia.   LV Ejection Fraction: 61%. LV Wall Motion: NL LV Function; NL Wall Motion  Loralie Champagne  01/31/2013  CARDIAC CATH FROM June 2012 FINAL ASSESSMENT:  1. Severe ostial diagonal stenosis with successful cutting balloon  angioplasty.  2. Continued patency of the right coronary artery stents.  3. Nonobstructive left anterior descending and left circumflex  stenosis.   RECOMMENDATIONS: The patient Dshould be continued on dual antiplatelet  therapy and aggressive medical management. This patient has had chronic  chest pain and many catheterization procedures. I suspect he will have  recurrent chest pain and would consider nuclear stress testing if this  occurs. I really do not think there is much more that could be safely  done with his diagonal ostium without jeopardizing the LAD. I think  that significant ischemia should be proven before considering repeat  PCI. Hopefully, he will have a good response and be angina free.  Juanda Bond. Burt Knack, MD  MDC/MEDQ D: 04/07/2011 T: 04/07/2011 Job: [id number]   Assessment / Plan: 1. Chronic chest pain - enies signif pains  2. Known CAD - Patient continues to have chest pain with exertion.  More fatigued.  Felt better in fall, much better.  Myoview did not show signif ischemia but may relfect false negative.  Would set up for L heart cath  Will premedicate.  Plan with [name] who did  intervention in past  3. HLD  Keep on current regimen  4.  HTN  Follow

## 2014-01-04 LAB — BASIC METABOLIC PANEL
BUN: 7 mg/dL (ref 6–23)
CALCIUM: 9.6 mg/dL (ref 8.4–10.5)
CO2: 30 meq/L (ref 19–32)
Chloride: 102 mEq/L (ref 96–112)
Creat: 0.9 mg/dL (ref 0.50–1.35)
GLUCOSE: 76 mg/dL (ref 70–99)
Potassium: 4.6 mEq/L (ref 3.5–5.3)
SODIUM: 144 meq/L (ref 135–145)

## 2014-01-04 LAB — PROTIME-INR
INR: 1.01 (ref <1.50)
Prothrombin Time: 13.2 seconds (ref 11.6–15.2)

## 2014-01-09 ENCOUNTER — Encounter (HOSPITAL_COMMUNITY): Payer: Self-pay | Admitting: Pharmacy Technician

## 2014-01-10 ENCOUNTER — Telehealth: Payer: Self-pay | Admitting: Cardiovascular Disease

## 2014-01-10 NOTE — Telephone Encounter (Signed)
New message     Need orders for a procedure scheduled for 01-13-14

## 2014-01-12 ENCOUNTER — Other Ambulatory Visit: Payer: Self-pay | Admitting: Cardiovascular Disease

## 2014-01-12 DIAGNOSIS — I2510 Atherosclerotic heart disease of native coronary artery without angina pectoris: Secondary | ICD-10-CM

## 2014-01-13 ENCOUNTER — Encounter (HOSPITAL_COMMUNITY)
Admission: RE | Disposition: A | Discharge status: Home / Self Care | Payer: Self-pay | Source: Ambulatory Visit | Attending: Cardiovascular Disease

## 2014-01-13 ENCOUNTER — Ambulatory Visit (HOSPITAL_COMMUNITY)
Admission: RE | Admit: 2014-01-13 | Discharge: 2014-01-13 | Disposition: A | Discharge status: Home / Self Care | Payer: Medicare Other | Source: Ambulatory Visit | Attending: Cardiovascular Disease | Admitting: Cardiovascular Disease

## 2014-01-13 DIAGNOSIS — E785 Hyperlipidemia, unspecified: Secondary | ICD-10-CM | POA: Insufficient documentation

## 2014-01-13 DIAGNOSIS — T82897A Other specified complication of cardiac prosthetic devices, implants and grafts, initial encounter: Secondary | ICD-10-CM | POA: Insufficient documentation

## 2014-01-13 DIAGNOSIS — I252 Old myocardial infarction: Secondary | ICD-10-CM | POA: Insufficient documentation

## 2014-01-13 DIAGNOSIS — I2510 Atherosclerotic heart disease of native coronary artery without angina pectoris: Secondary | ICD-10-CM | POA: Insufficient documentation

## 2014-01-13 DIAGNOSIS — Z7902 Long term (current) use of antithrombotics/antiplatelets: Secondary | ICD-10-CM | POA: Insufficient documentation

## 2014-01-13 DIAGNOSIS — Y831 Surgical operation with implant of artificial internal device as the cause of abnormal reaction of the patient, or of later complication, without mention of misadventure at the time of the procedure: Secondary | ICD-10-CM | POA: Insufficient documentation

## 2014-01-13 DIAGNOSIS — R079 Chest pain, unspecified: Secondary | ICD-10-CM | POA: Insufficient documentation

## 2014-01-13 DIAGNOSIS — Z7982 Long term (current) use of aspirin: Secondary | ICD-10-CM | POA: Insufficient documentation

## 2014-01-13 DIAGNOSIS — I10 Essential (primary) hypertension: Secondary | ICD-10-CM | POA: Insufficient documentation

## 2014-01-13 DIAGNOSIS — R0609 Other forms of dyspnea: Secondary | ICD-10-CM | POA: Insufficient documentation

## 2014-01-13 DIAGNOSIS — K219 Gastro-esophageal reflux disease without esophagitis: Secondary | ICD-10-CM | POA: Insufficient documentation

## 2014-01-13 DIAGNOSIS — R0989 Other specified symptoms and signs involving the circulatory and respiratory systems: Secondary | ICD-10-CM | POA: Insufficient documentation

## 2014-01-13 DIAGNOSIS — J45909 Unspecified asthma, uncomplicated: Secondary | ICD-10-CM | POA: Insufficient documentation

## 2014-01-13 DIAGNOSIS — M549 Dorsalgia, unspecified: Secondary | ICD-10-CM | POA: Insufficient documentation

## 2014-01-13 HISTORY — PX: LEFT HEART CATHETERIZATION WITH CORONARY ANGIOGRAM: SHX5451

## 2014-01-13 SURGERY — LEFT HEART CATHETERIZATION WITH CORONARY ANGIOGRAM
Anesthesia: LOCAL

## 2014-01-13 MED ORDER — SODIUM CHLORIDE 0.9 % IV SOLN
INTRAVENOUS | Status: DC
  Administered 2014-01-13: 09:00:00 via INTRAVENOUS

## 2014-01-13 MED ORDER — ADENOSINE 12 MG/4ML IV SOLN
16.0000 mL | Freq: Once | INTRAVENOUS | Status: DC
  Filled 2014-01-13: qty 16

## 2014-01-13 MED ORDER — HEPARIN (PORCINE) IN NACL 2-0.9 UNIT/ML-% IJ SOLN
INTRAMUSCULAR | Status: AC
  Filled 2014-01-13: qty 1000

## 2014-01-13 MED ORDER — SODIUM CHLORIDE 0.9 % IV SOLN: 1.0000 mL/kg/h | INTRAVENOUS | Status: DC

## 2014-01-13 MED ORDER — FENTANYL CITRATE 0.05 MG/ML IJ SOLN
INTRAMUSCULAR | Status: AC
  Filled 2014-01-13: qty 2

## 2014-01-13 MED ORDER — ONDANSETRON HCL 4 MG/2ML IJ SOLN: 4.0000 mg | Freq: Four times a day (QID) | INTRAMUSCULAR | Status: DC | PRN

## 2014-01-13 MED ORDER — LIDOCAINE HCL (PF) 1 % IJ SOLN
INTRAMUSCULAR | Status: AC
  Filled 2014-01-13: qty 30

## 2014-01-13 MED ORDER — NITROGLYCERIN 0.2 MG/ML ON CALL CATH LAB
INTRAVENOUS | Status: AC
  Filled 2014-01-13: qty 1

## 2014-01-13 MED ORDER — DIPHENHYDRAMINE HCL 50 MG/ML IJ SOLN
25.0000 mg | Freq: Once | INTRAMUSCULAR | Status: AC
  Administered 2014-01-13: 25 mg via INTRAVENOUS
  Filled 2014-01-13: qty 1

## 2014-01-13 MED ORDER — SODIUM CHLORIDE 0.9 % IJ SOLN: 3.0000 mL | Freq: Two times a day (BID) | INTRAMUSCULAR | Status: DC

## 2014-01-13 MED ORDER — SODIUM CHLORIDE 0.9 % IJ SOLN: 3.0000 mL | INTRAMUSCULAR | Status: DC | PRN

## 2014-01-13 MED ORDER — HEPARIN SODIUM (PORCINE) 1000 UNIT/ML IJ SOLN
INTRAMUSCULAR | Status: AC
  Filled 2014-01-13: qty 1

## 2014-01-13 MED ORDER — ASPIRIN 81 MG PO CHEW: 81.0000 mg | CHEWABLE_TABLET | ORAL | Status: DC

## 2014-01-13 MED ORDER — MIDAZOLAM HCL 2 MG/2ML IJ SOLN
INTRAMUSCULAR | Status: AC
  Filled 2014-01-13: qty 2

## 2014-01-13 MED ORDER — ACETAMINOPHEN 325 MG PO TABS: 650.0000 mg | ORAL_TABLET | ORAL | Status: DC | PRN

## 2014-01-13 MED ORDER — VERAPAMIL HCL 2.5 MG/ML IV SOLN
INTRAVENOUS | Status: AC
  Filled 2014-01-13: qty 2

## 2014-01-13 MED ORDER — OXYCODONE-ACETAMINOPHEN 5-325 MG PO TABS: 1.0000 | ORAL_TABLET | ORAL | Status: DC | PRN

## 2014-01-13 MED ORDER — SODIUM CHLORIDE 0.9 % IV SOLN: 250.0000 mL | INTRAVENOUS | Status: DC | PRN

## 2014-01-13 NOTE — CV Procedure (Signed)
    Cardiac Catheterization Procedure Note  Name: [name] MRN: [id number] DOB: 05/25/1931  Procedure: Left Heart Cath, Selective Coronary Angiography, FFR - Diagonal 1  Indication: Chest pain, known CAD. Pt with normal stress test but continued chest pain, known ostial diagonal stenosis, referred for cath and possible PCI.   Procedural Details: The right wrist was prepped, draped, and anesthetized with 1% lidocaine. Using the modified Seldinger technique, a 5/6 French sheath was introduced into the right radial artery. 3 mg of verapamil was administered through the sheath, weight-based unfractionated heparin was administered intravenously. Standard Judkins catheters were used for selective coronary angiography. After diagnostic cath, I elected to perform FFR of the diagonal. An XB-LAD guide was used. Heparin was used for anticoagulation. A PrimeWire was normalized at the guide tip then advanced into the diagonal. The resting FFR was 0.99. With IV adenosine, the FFR at peak hyperemia was 0.95. Catheter exchanges were performed over an exchange length guidewire. There were no immediate procedural complications. A TR band was used for radial hemostasis at the completion of the procedure.  The patient was transferred to the post catheterization recovery area for further monitoring.  Procedural Findings: Hemodynamics: AO 137/64 LV 131/10  Coronary angiography: Coronary dominance: right  Left mainstem: The left main stem is mildly calcified. The vessel is widely patent and divides into the LAD and left circumflex.  Left anterior descending (LAD): The LAD is mild to moderately calcified. The proximal vessel is widely patent. The first diagonal origin has a 70-75% stenosis. The LAD after the diagonal has a 40-50% stenosis. There is mild diffuse nonobstructive disease throughout the course of the LAD but there is no significant high-grade stenosis noted.  Left circumflex (LCx): The left  circumflex is large in caliber. There is mild diffuse irregularity but no significant stenosis noted throughout. The obtuse marginal branches are patent.  Right coronary artery (RCA): The RCA is dominant. The stented segment throughout the midportion is patent. There is mild 30-40% in-stent restenosis in the middle portion of the stented segment. There is mild irregularity proximally. The distal vessel is widely patent.  Left ventriculography: Deferred, known to be normal from recent Myoview.  Final Conclusions:   1. Single-vessel coronary artery disease with moderately tight stenosis of the ostium of the diagonal branch, normal flow by FFR 2. Mild diffuse nonobstructive coronary artery disease 3. Known normal LV function by noninvasive assessment  Recommendations: Continued medical therapy. Suspect noncardiac chest pain. There is no evidence of flow limiting coronary artery disease on this study.  Sherren Mocha 01/13/2014, 1:01 PM

## 2014-01-13 NOTE — H&P (View-Only) (Signed)
ID   Patient is an 80 yo with known CAD  I saw him last in the fall 2014.  His last cath was in 2012 when he underwent cutting balloon angioplasty [name]) of diagonal (repeat intervention)  Mild disease elsewhere.   SInce I saw him, he was admitted to [facility] a few wks ago.  He was having CP  Felt at time to be more pleuritic  R/O for MI  Myoview was done that showed no ischemia Since d/c though he has continued to have CP with exertion.  Today he says it is not pleuritic.   Current Outpatient Prescriptions on File Prior to Visit  Medication Sig Dispense Refill  . acetaminophen (TYLENOL) 325 MG tablet Take 2 tablets (650 mg total) by mouth every 4 (four) hours as needed for headache or mild pain.      Marland Kitchen albuterol (PROVENTIL HFA;VENTOLIN HFA) 108 (90 BASE) MCG/ACT inhaler Inhale 2 puffs into the lungs 2 (two) times daily as needed for wheezing or shortness of breath.      Marland Kitchen amLODipine (NORVASC) 5 MG tablet Take 5 mg by mouth daily.      Marland Kitchen aspirin EC 81 MG tablet Take 81 mg by mouth daily.      Marland Kitchen atorvastatin (LIPITOR) 40 MG tablet Take 40 mg by mouth daily.      . cholecalciferol (VITAMIN D) 1000 UNITS tablet Take 5,000 Units by mouth daily.       . clopidogrel (PLAVIX) 75 MG tablet Take 1 tablet (75 mg total) by mouth daily.  90 tablet  3  . colestipol (COLESTID) 1 G tablet Take 1 g by mouth 2 (two) times daily.      Marland Kitchen doxazosin (CARDURA) 4 MG tablet Take 1 tablet (4 mg total) by mouth at bedtime.  90 tablet  3  . fluocinonide gel (LIDEX) 8.52 % Apply 1 application topically as needed (insect bites).      . fluticasone (FLOVENT HFA) 220 MCG/ACT inhaler Inhale 1 puff into the lungs 2 (two) times daily.      . furosemide (LASIX) 40 MG tablet Take 40 mg by mouth daily as needed for edema.      . isosorbide mononitrate (IMDUR) 60 MG 24 hr tablet Take 60 mg by mouth daily.      . metoprolol tartrate (LOPRESSOR) 25 MG tablet Take 1 tablet (25 mg total) by mouth 2 (two) times daily.  180  tablet  3  . nitroGLYCERIN (NITROSTAT) 0.4 MG SL tablet Place 1 tablet (0.4 mg total) under the tongue every 5 (five) minutes as needed.  25 tablet  6  . pantoprazole (PROTONIX) 40 MG tablet Take 1 tablet (40 mg total) by mouth 2 (two) times daily.  180 tablet  3  . potassium chloride (K-DUR,KLOR-CON) 10 MEQ tablet Take 10 mEq by mouth daily.      . Tamsulosin HCl (FLOMAX) 0.4 MG CAPS Take 0.4 mg by mouth daily.      . vitamin B-12 (CYANOCOBALAMIN) 1000 MCG tablet Take 1,000 mcg by mouth daily.       No current facility-administered medications on file prior to visit.    Allergies  Allergen Reactions  . Iohexol Shortness Of Breath and Itching    Pt was given 100cc of Omnipaque 300 followed by itching/ dyspnea.  . Budesonide-Formoterol Fumarate     REACTION: Mouth and tongue swelling  . Demerol [Meperidine] Other (See Comments)    Hallucinations  . Ivp Dye [Iodinated Diagnostic Agents]  Iodine pt had a reaction when he had a CT DONE  . Meperidine Hcl     Hallucinations  . Naproxen Swelling  . Simvastatin   . Pregabalin Rash  . Sulfonamide Derivatives Rash    Past Medical History  Diagnosis Date  . Hypertension   . Hyperlipidemia   . Chronic chest pain   . Dyspnea     chronic  . GERD (gastroesophageal reflux disease)     h/o esophageal spasm  . Asthma   . Back pain   . Coronary artery disease     a. s/p BMS to RCA in 2002; b. s/p cutting balloon POBA ;   c. cath 6/12: oDx 80% (treated with repeat cutting balloon POBA), mLAD 50% with 30-40% at Dx, CFX 30%, pRCA 25% with patent stents;  d.  Lex MV 4/14:  Low Risk - EF 61%, inf scar with peri-infarct ischemia  . Myocardial infarction     Past Surgical History  Procedure Laterality Date  . Cardiac catheterization    . Neck surgery      multiple  . Back surgery      multiple  . Laminectomy    . Posterior laminectomy / decompression cervical spine    . Anterior cervical decomp/discectomy fusion    . Coronary  angioplasty with stent placement       previous percutaneous intervention on the  RCA and the diagonal branch  . Esophagogastroduodenoscopy  03/23/2012    Procedure: ESOPHAGOGASTRODUODENOSCOPY (EGD);  Surgeon: James L Edwards Jr., MD;  Location: [facility];  Service: Endoscopy;  Laterality: N/A;  . Savory dilation  03/23/2012    Procedure: SAVORY DILATION;  Surgeon: James L Edwards Jr., MD;  Location: [facility];  Service: Endoscopy;  Laterality: N/A;  . Tonsillectomy    . Eye surgery      History  Smoking status  . Never Smoker   Smokeless tobacco  . Not on file    History  Alcohol Use No    Family History  Problem Relation Age of Onset  . Diabetes Father   . Heart disease Father   . Asthma Father   . Heart disease Mother     CABG hx age 75  . Colon cancer Son     hx   . Colitis Son     hx  . Crohn's disease Son     Review of Systems: The review of systems is per the HPI.  All other systems were reviewed and are negative.  Physical Exam: BP 144/66  Pulse 55  Ht 5' 10" (1.778 m)  Wt 180 lb (81.647 kg)  BMI 25.83 kg/m2  SpO2 97% Patient is very pleasant and in no acute distress. Skin is warm and dry. Color is normal.  HEENT is unremarkable except for very poor dentition. Normocephalic/atraumatic. PERRL. Sclera are nonicteric. Neck is supple. No masses. No JVD. Lungs are clear. Cardiac exam shows a regular rate and rhythm. Abdomen is soft. Extremities are without edema. Gait and ROM are intact. No gross neurologic deficits noted.  LABORATORY DATA: n/a  Lab Results  Component Value Date   WBC 6.8 12/16/2013   HGB 12.6* 12/16/2013   HCT 37.6* 12/16/2013   PLT 177 12/16/2013   GLUCOSE 98 12/14/2013   CHOL 169 07/08/2013   TRIG 98.0 07/08/2013   HDL 56.40 07/08/2013   LDLCALC 93 07/08/2013   ALT 15 07/08/2013   AST 20 07/08/2013   NA 142 12/14/2013   K 4.1 12/14/2013     CL 103 12/14/2013   CREATININE 0.92 12/14/2013   BUN 9 12/14/2013   CO2 28 12/14/2013   TSH 1.12 01/07/2013    PSA 5.73* 01/07/2013   INR 1.12 04/14/2012   Myoview Impression  Exercise Capacity: Lexiscan with no exercise.  BP Response: Normal blood pressure response.  Clinical Symptoms: Chest pain.  ECG Impression: No significant ST segment change suggestive of ischemia.  Comparison with Prior Nuclear Study: Prior study showed no significant defect.   Overall Impression: Low risk stress nuclear study. There was a medium-sized, mild basal to mid inferior perfusion defect that was partially reversible. This suggests prior inferior infarction with peri-infarct ischemia.   LV Ejection Fraction: 61%. LV Wall Motion: NL LV Function; NL Wall Motion  Dalton McLean  01/31/2013  CARDIAC CATH FROM June 2012 FINAL ASSESSMENT:  1. Severe ostial diagonal stenosis with successful cutting balloon  angioplasty.  2. Continued patency of the right coronary artery stents.  3. Nonobstructive left anterior descending and left circumflex  stenosis.   RECOMMENDATIONS: The patient Dshould be continued on dual antiplatelet  therapy and aggressive medical management. This patient has had chronic  chest pain and many catheterization procedures. I suspect he will have  recurrent chest pain and would consider nuclear stress testing if this  occurs. I really do not think there is much more that could be safely  done with his diagonal ostium without jeopardizing the LAD. I think  that significant ischemia should be proven before considering repeat  PCI. Hopefully, he will have a good response and be angina free.  Michael D. Cooper, MD  MDC/MEDQ D: 04/07/2011 T: 04/07/2011 Job: [id number]   Assessment / Plan: 1. Chronic chest pain - enies signif pains  2. Known CAD - Patient continues to have chest pain with exertion.  More fatigued.  Felt better in fall, much better.  Myoview did not show signif ischemia but may relfect false negative.  Would set up for L heart cath  Will premedicate.  Plan with M Cooper who did  intervention in past  3. HLD  Keep on current regimen  4.  HTN  Follow   

## 2014-01-13 NOTE — Interval H&P Note (Signed)
History and Physical Interval Note:  01/13/2014 12:18 PM  [name]  has presented today for surgery, with the diagnosis of CP  The various methods of treatment have been discussed with the patient and family. After consideration of risks, benefits and other options for treatment, the patient has consented to  Procedure(s): LEFT HEART CATHETERIZATION WITH CORONARY ANGIOGRAM (N/A) as a surgical intervention .  The patient's history has been reviewed, patient examined, no change in status, stable for surgery.  I have reviewed the patient's chart and labs.  Questions were answered to the patient's satisfaction.    Cath Lab Visit (complete for each Cath Lab visit)  Clinical Evaluation Leading to the Procedure:   ACS: no  Non-ACS:    Anginal Classification: CCS III  Anti-ischemic medical therapy: Maximal Therapy (2 or more classes of medications)  Non-Invasive Test Results: Low-risk stress test findings: cardiac mortality <1%/year  Prior CABG: No previous CABG        Sherren Mocha

## 2014-01-13 NOTE — Discharge Instructions (Signed)

## 2014-01-14 LAB — POCT ACTIVATED CLOTTING TIME: ACTIVATED CLOTTING TIME: 221 s

## 2014-01-27 ENCOUNTER — Telehealth: Payer: Self-pay | Admitting: Internal Medicine

## 2014-01-27 NOTE — Telephone Encounter (Signed)
New Message  Pt son called requests a call back to determine if a follow up appt per Cath on 01/13/2014 is needed. Please call

## 2014-01-30 ENCOUNTER — Encounter: Payer: Self-pay | Admitting: Physician Assistant

## 2014-01-30 ENCOUNTER — Ambulatory Visit (INDEPENDENT_AMBULATORY_CARE_PROVIDER_SITE_OTHER): Payer: Medicare Other | Admitting: Physician Assistant

## 2014-01-30 VITALS — BP 122/78 | HR 56 | Ht 69.0 in | Wt 181.0 lb

## 2014-01-30 DIAGNOSIS — E785 Hyperlipidemia, unspecified: Secondary | ICD-10-CM

## 2014-01-30 DIAGNOSIS — I2510 Atherosclerotic heart disease of native coronary artery without angina pectoris: Secondary | ICD-10-CM

## 2014-01-30 DIAGNOSIS — K219 Gastro-esophageal reflux disease without esophagitis: Secondary | ICD-10-CM

## 2014-01-30 DIAGNOSIS — R079 Chest pain, unspecified: Secondary | ICD-10-CM

## 2014-01-30 DIAGNOSIS — I10 Essential (primary) hypertension: Secondary | ICD-10-CM

## 2014-01-30 MED ORDER — FUROSEMIDE 40 MG PO TABS: 40.0000 mg | ORAL_TABLET | Freq: Every day | ORAL | Status: DC | PRN

## 2014-01-30 NOTE — Progress Notes (Signed)
[address] [place], [place]  [address] Phone: [phone] Fax:  [phone]  Date:  01/30/2014   ID:  [name], DOB 1931/02/20, MRN [id number]  PCP:  Geroge Baseman  Cardiologist:  Dr. Dorris Carnes   GI:  Dr. Oletta Lamas   History of Present Illness: [name] is a 80 y.o. male with a hx of CAD, s/p stent to RCA and cutting balloon angioplasty to the Dx, chronic chest pain, HTN, HL, GERD.  He was admitted in 11/2013 with chest pain.  He ruled out for MI.  Inpatient Myoview demonstrated no ischemia.  EF was normal by echo.  He saw Dr. Harrington Challenger in follow up.  He continued to have exertional chest pain.  He was set up for cardiac catheterization. This demonstrated patent stent to the RCA. Ostial diagonal demonstrated 70-75%. FFR was normal (0.95 at peak hyperemia). Medical therapy was recommended.  He continues to note occasional chest pain since discharge. This is not exertional. He does have occasional odynophagia. He feels as though his symptoms may be reminiscent of what he had prior to esophageal dilatation in the past. He has chronic dyspnea with exertion. He describes NYHA class 2b-3 symptoms. There has been no change. He denies syncope. He denies orthopnea, PND. There has been no significant pedal edema.   Studies:  - LHC (01/13/14):  oD1 70-75, LAD 40-50, mRCA stent patent with 30-40 ISR.  FFR of oDx normal.  Med Rx.  - Echo (12/2013):  EF 55-60%, mild LAE,   - Nuclear (12/2013):  Inferior infarct, no ischemia, EF 47%.   Recent Labs: 07/08/2013: ALT 15; HDL Cholesterol 56.40; LDL (calc) 93  12/14/2013: Pro B Natriuretic peptide (BNP) 729.0*  01/03/2014: Creatinine 0.90; Hemoglobin 13.4; Potassium 4.6   Wt Readings from Last 3 Encounters:  01/30/14 181 lb (82.101 kg)  01/13/14 175 lb (79.379 kg)  01/13/14 175 lb (79.379 kg)     Past Medical History  Diagnosis Date  . Hypertension   . Hyperlipidemia   . Chronic chest pain   . Dyspnea     chronic  .  GERD (gastroesophageal reflux disease)     h/o esophageal spasm  . Asthma   . Back pain   . Coronary artery disease     a. s/p BMS to RCA in 2002; b. s/p cutting balloon POBA ;   c. cath 6/12: oDx 80% (treated with repeat cutting balloon POBA), mLAD 50% with 30-40% at Dx, CFX 30%, pRCA 25% with patent stents;  d.  Lex MV 4/14:  Low Risk - EF 61%, inf scar with peri-infarct ischemia  . Myocardial infarction     Current Outpatient Prescriptions  Medication Sig Dispense Refill  . acetaminophen (TYLENOL) 325 MG tablet Take 2 tablets (650 mg total) by mouth every 4 (four) hours as needed for headache or mild pain.      Marland Kitchen albuterol (PROVENTIL HFA;VENTOLIN HFA) 108 (90 BASE) MCG/ACT inhaler Inhale 2 puffs into the lungs 2 (two) times daily as needed for wheezing or shortness of breath.      Marland Kitchen amLODipine (NORVASC) 5 MG tablet Take 5 mg by mouth daily.      Marland Kitchen aspirin EC 81 MG tablet Take 81 mg by mouth daily.      Marland Kitchen atorvastatin (LIPITOR) 40 MG tablet Take 40 mg by mouth daily.      . cholecalciferol (VITAMIN D) 1000 UNITS tablet Take 1,000 Units by mouth daily.       Marland Kitchen  clopidogrel (PLAVIX) 75 MG tablet Take 1 tablet (75 mg total) by mouth daily.  90 tablet  3  . cyanocobalamin 1000 MCG tablet Take 100 mcg by mouth daily.      Marland Kitchen doxazosin (CARDURA) 4 MG tablet Take 4 mg by mouth daily.      . fluocinonide gel (LIDEX) 0.35 % Apply 1 application topically as needed (insect bites).      . fluticasone (FLOVENT HFA) 220 MCG/ACT inhaler Inhale 1 puff into the lungs 2 (two) times daily.      . furosemide (LASIX) 40 MG tablet Take 40 mg by mouth daily as needed for edema.      . nitroGLYCERIN (NITROSTAT) 0.4 MG SL tablet Place 0.4 mg under the tongue every 5 (five) minutes as needed for chest pain.       No current facility-administered medications for this visit.    Allergies:   Iohexol; Budesonide-formoterol fumarate; Demerol; Ivp dye; Meperidine hcl; Naproxen; Simvastatin; Pregabalin; and Sulfonamide  derivatives   Social History:  The patient  reports that he has never smoked. He does not have any smokeless tobacco history on file. He reports that he does not drink alcohol or use illicit drugs.   Family History:  The patient's family history includes Asthma in his father; Colitis in his son; Colon cancer in his son; Crohn's disease in his son; Diabetes in his father; Heart disease in his father and mother.   ROS:  Please see the history of present illness.   He denies melena, hematochezia, vomiting.   All other systems reviewed and negative.   PHYSICAL EXAM: VS:  BP 122/78  Pulse 56  Ht 5\' 9"  (1.753 m)  Wt 181 lb (82.101 kg)  BMI 26.72 kg/m2 Well nourished, well developed, in no acute distress HEENT: normal Neck: no JVD Cardiac:  normal S1, S2; RRR; no murmur Lungs:  clear to auscultation bilaterally, no wheezing, rhonchi or rales Abd: soft, nontender, no hepatomegaly Ext: no edemaright wrist without hematoma or mass  Skin: warm and dry Neuro:  CNs 2-12 intact, no focal abnormalities noted  EKG:  Sinus bradycardia, HR 56, normal axis, no ST changes     ASSESSMENT AND PLAN:  1. CAD: As noted, recent cardiac catheterization demonstrated patent stent and nonobstructive disease in the diagonal. Continued medical therapy has been recommended. Continue amlodipine, aspirin, statin, Plavix. 2. Chest pain: He has had some symptoms recently reminiscent of what he had prior to esophageal dilatation. I have recommended that he continue his Protonix. He plans to call his gastroenterologist for follow up. 3. Hypertension: Controlled. 4. Hyperlipidemia: Continue statin. 5. GERD: Continue PPI. Follow up with GI as noted above. 6. Disposition: Follow up with Dr. Harrington Challenger in 3 months.  Signed, Richardson Dopp, PA-C  01/30/2014 1:04 PM

## 2014-01-30 NOTE — Patient Instructions (Signed)
Your physician recommends that you continue on your current medications as directed. Please refer to the Current Medication list given to you today.  Your physician recommends that you schedule a follow-up appointment in: 3 months with Dr Ross 

## 2014-02-05 ENCOUNTER — Other Ambulatory Visit: Payer: Self-pay | Admitting: Internal Medicine

## 2014-02-10 NOTE — Telephone Encounter (Signed)
Follow up scheduled

## 2014-03-02 ENCOUNTER — Encounter (HOSPITAL_COMMUNITY): Payer: Self-pay | Admitting: Emergency Medicine

## 2014-03-02 ENCOUNTER — Inpatient Hospital Stay (HOSPITAL_COMMUNITY)
Admission: EM | Admit: 2014-03-02 | Discharge: 2014-03-05 | DRG: 379 | Disposition: A | Discharge status: Home / Self Care | Payer: Medicare Other | Attending: Internal Medicine | Admitting: Internal Medicine

## 2014-03-02 DIAGNOSIS — M545 Low back pain, unspecified: Secondary | ICD-10-CM | POA: Diagnosis present

## 2014-03-02 DIAGNOSIS — Z8679 Personal history of other diseases of the circulatory system: Secondary | ICD-10-CM

## 2014-03-02 DIAGNOSIS — M129 Arthropathy, unspecified: Secondary | ICD-10-CM | POA: Diagnosis present

## 2014-03-02 DIAGNOSIS — R079 Chest pain, unspecified: Secondary | ICD-10-CM

## 2014-03-02 DIAGNOSIS — J45909 Unspecified asthma, uncomplicated: Secondary | ICD-10-CM | POA: Diagnosis present

## 2014-03-02 DIAGNOSIS — Z8659 Personal history of other mental and behavioral disorders: Secondary | ICD-10-CM

## 2014-03-02 DIAGNOSIS — K6289 Other specified diseases of anus and rectum: Secondary | ICD-10-CM

## 2014-03-02 DIAGNOSIS — Z8249 Family history of ischemic heart disease and other diseases of the circulatory system: Secondary | ICD-10-CM

## 2014-03-02 DIAGNOSIS — Z885 Allergy status to narcotic agent status: Secondary | ICD-10-CM

## 2014-03-02 DIAGNOSIS — E785 Hyperlipidemia, unspecified: Secondary | ICD-10-CM | POA: Diagnosis present

## 2014-03-02 DIAGNOSIS — R112 Nausea with vomiting, unspecified: Secondary | ICD-10-CM

## 2014-03-02 DIAGNOSIS — Z87442 Personal history of urinary calculi: Secondary | ICD-10-CM

## 2014-03-02 DIAGNOSIS — R0602 Shortness of breath: Secondary | ICD-10-CM

## 2014-03-02 DIAGNOSIS — G8929 Other chronic pain: Secondary | ICD-10-CM | POA: Diagnosis present

## 2014-03-02 DIAGNOSIS — Z800 Family history of malignant neoplasm of digestive organs: Secondary | ICD-10-CM

## 2014-03-02 DIAGNOSIS — Z91041 Radiographic dye allergy status: Secondary | ICD-10-CM

## 2014-03-02 DIAGNOSIS — Z833 Family history of diabetes mellitus: Secondary | ICD-10-CM

## 2014-03-02 DIAGNOSIS — K589 Irritable bowel syndrome without diarrhea: Secondary | ICD-10-CM

## 2014-03-02 DIAGNOSIS — Z87898 Personal history of other specified conditions: Secondary | ICD-10-CM

## 2014-03-02 DIAGNOSIS — I10 Essential (primary) hypertension: Secondary | ICD-10-CM | POA: Diagnosis present

## 2014-03-02 DIAGNOSIS — R630 Anorexia: Secondary | ICD-10-CM

## 2014-03-02 DIAGNOSIS — R42 Dizziness and giddiness: Secondary | ICD-10-CM

## 2014-03-02 DIAGNOSIS — N400 Enlarged prostate without lower urinary tract symptoms: Secondary | ICD-10-CM | POA: Diagnosis present

## 2014-03-02 DIAGNOSIS — Z9889 Other specified postprocedural states: Secondary | ICD-10-CM

## 2014-03-02 DIAGNOSIS — K922 Gastrointestinal hemorrhage, unspecified: Secondary | ICD-10-CM | POA: Diagnosis present

## 2014-03-02 DIAGNOSIS — Z882 Allergy status to sulfonamides status: Secondary | ICD-10-CM

## 2014-03-02 DIAGNOSIS — K5731 Diverticulosis of large intestine without perforation or abscess with bleeding: Principal | ICD-10-CM | POA: Diagnosis present

## 2014-03-02 DIAGNOSIS — Z23 Encounter for immunization: Secondary | ICD-10-CM

## 2014-03-02 DIAGNOSIS — Z7902 Long term (current) use of antithrombotics/antiplatelets: Secondary | ICD-10-CM

## 2014-03-02 DIAGNOSIS — R531 Weakness: Secondary | ICD-10-CM | POA: Diagnosis present

## 2014-03-02 DIAGNOSIS — R0789 Other chest pain: Secondary | ICD-10-CM

## 2014-03-02 DIAGNOSIS — Z872 Personal history of diseases of the skin and subcutaneous tissue: Secondary | ICD-10-CM

## 2014-03-02 DIAGNOSIS — R1319 Other dysphagia: Secondary | ICD-10-CM

## 2014-03-02 DIAGNOSIS — R1084 Generalized abdominal pain: Secondary | ICD-10-CM

## 2014-03-02 DIAGNOSIS — I2510 Atherosclerotic heart disease of native coronary artery without angina pectoris: Secondary | ICD-10-CM | POA: Diagnosis present

## 2014-03-02 DIAGNOSIS — Z86010 Personal history of colonic polyps: Secondary | ICD-10-CM

## 2014-03-02 DIAGNOSIS — Z9861 Coronary angioplasty status: Secondary | ICD-10-CM

## 2014-03-02 DIAGNOSIS — I252 Old myocardial infarction: Secondary | ICD-10-CM

## 2014-03-02 DIAGNOSIS — K449 Diaphragmatic hernia without obstruction or gangrene: Secondary | ICD-10-CM | POA: Diagnosis present

## 2014-03-02 DIAGNOSIS — Z8719 Personal history of other diseases of the digestive system: Secondary | ICD-10-CM

## 2014-03-02 DIAGNOSIS — G609 Hereditary and idiopathic neuropathy, unspecified: Secondary | ICD-10-CM

## 2014-03-02 DIAGNOSIS — R1310 Dysphagia, unspecified: Secondary | ICD-10-CM | POA: Diagnosis present

## 2014-03-02 DIAGNOSIS — K625 Hemorrhage of anus and rectum: Secondary | ICD-10-CM

## 2014-03-02 DIAGNOSIS — K219 Gastro-esophageal reflux disease without esophagitis: Secondary | ICD-10-CM | POA: Diagnosis present

## 2014-03-02 DIAGNOSIS — Z886 Allergy status to analgesic agent status: Secondary | ICD-10-CM

## 2014-03-02 DIAGNOSIS — K222 Esophageal obstruction: Secondary | ICD-10-CM

## 2014-03-02 DIAGNOSIS — I219 Acute myocardial infarction, unspecified: Secondary | ICD-10-CM

## 2014-03-02 DIAGNOSIS — D649 Anemia, unspecified: Secondary | ICD-10-CM | POA: Diagnosis present

## 2014-03-02 DIAGNOSIS — J301 Allergic rhinitis due to pollen: Secondary | ICD-10-CM

## 2014-03-02 LAB — COMPREHENSIVE METABOLIC PANEL
ALBUMIN: 3.8 g/dL (ref 3.5–5.2)
ALK PHOS: 68 U/L (ref 39–117)
ALT: 12 U/L (ref 0–53)
AST: 20 U/L (ref 0–37)
BUN: 7 mg/dL (ref 6–23)
CO2: 25 mEq/L (ref 19–32)
Calcium: 9.4 mg/dL (ref 8.4–10.5)
Chloride: 101 mEq/L (ref 96–112)
Creatinine, Ser: 0.91 mg/dL (ref 0.50–1.35)
GFR calc Af Amer: >90 mL/min (ref >90)
GFR calc non Af Amer: 78 mL/min — ABNORMAL LOW (ref >90)
Glucose, Bld: 99 mg/dL (ref 70–99)
POTASSIUM: 4 meq/L (ref 3.7–5.3)
SODIUM: 138 meq/L (ref 137–147)
TOTAL PROTEIN: 6.9 g/dL (ref 6.0–8.3)
Total Bilirubin: 0.6 mg/dL (ref 0.3–1.2)

## 2014-03-02 LAB — PROTIME-INR
INR: 0.97 (ref 0.00–1.49)
Prothrombin Time: 12.7 seconds (ref 11.6–15.2)

## 2014-03-02 LAB — CBC WITH DIFFERENTIAL/PLATELET
BASOS ABS: 0 10*3/uL (ref 0.0–0.1)
Basophils Relative: 0 % (ref 0–1)
EOS PCT: 3 % (ref 0–5)
Eosinophils Absolute: 0.2 10*3/uL (ref 0.0–0.7)
HCT: 37.2 % — ABNORMAL LOW (ref 39.0–52.0)
Hemoglobin: 12.1 g/dL — ABNORMAL LOW (ref 13.0–17.0)
Lymphocytes Relative: 27 % (ref 12–46)
Lymphs Abs: 1.8 10*3/uL (ref 0.7–4.0)
MCH: 30 pg (ref 26.0–34.0)
MCHC: 32.5 g/dL (ref 30.0–36.0)
MCV: 92.1 fL (ref 78.0–100.0)
Monocytes Absolute: 0.7 10*3/uL (ref 0.1–1.0)
Monocytes Relative: 10 % (ref 3–12)
NEUTROS PCT: 60 % (ref 43–77)
Neutro Abs: 4 10*3/uL (ref 1.7–7.7)
PLATELETS: 220 10*3/uL (ref 150–400)
RBC: 4.04 MIL/uL — AB (ref 4.22–5.81)
RDW: 13.4 % (ref 11.5–15.5)
WBC: 6.7 10*3/uL (ref 4.0–10.5)

## 2014-03-02 LAB — POC OCCULT BLOOD, ED: FECAL OCCULT BLD: POSITIVE — AB

## 2014-03-02 LAB — SAMPLE TO BLOOD BANK

## 2014-03-02 MED ORDER — SODIUM CHLORIDE 0.9 % IV SOLN
8.0000 mg/h | INTRAVENOUS | Status: DC
  Filled 2014-03-02: qty 80

## 2014-03-02 MED ORDER — ONDANSETRON HCL 4 MG/2ML IJ SOLN
4.0000 mg | Freq: Once | INTRAMUSCULAR | Status: AC
  Administered 2014-03-03: 4 mg via INTRAVENOUS
  Filled 2014-03-02: qty 2

## 2014-03-02 MED ORDER — SODIUM CHLORIDE 0.9 % IV SOLN
INTRAVENOUS | Status: DC
  Administered 2014-03-03: 125 mL/h via INTRAVENOUS

## 2014-03-02 MED ORDER — PANTOPRAZOLE SODIUM 40 MG IV SOLR: 40.0000 mg | Freq: Two times a day (BID) | INTRAVENOUS | Status: DC

## 2014-03-02 MED ORDER — SODIUM CHLORIDE 0.9 % IV SOLN
80.0000 mg | Freq: Once | INTRAVENOUS | Status: DC
  Filled 2014-03-02: qty 80

## 2014-03-02 NOTE — ED Notes (Signed)
Patient transported to X-ray 

## 2014-03-02 NOTE — ED Notes (Signed)
Pt. reports bloody stools/rectal bleeding onset this morning with abdominal cramping , denies nausea and vomitting , pt. stated he takes Plavix.

## 2014-03-02 NOTE — ED Provider Notes (Signed)
CSN: [id number]     Arrival date & time 03/02/14  2043 History   First MD Initiated Contact with Patient 03/02/14 2326     Chief Complaint  Patient presents with  . Rectal Bleeding     (Consider location/radiation/quality/duration/timing/severity/associated sxs/prior Treatment) HPI HX per PT - black and tarry stool this am with BM followed by dark clots and multiple BMs with the same throughout the day - no rectal pain, some intermittent ABD discomfort, no severe pain, R and L sided, complains of ABD distention last few days. Some nausea no emesis, no diarrhea or constipation, is on plavix, no h/o GI Bleed Past Medical History  Diagnosis Date  . Hypertension   . Hyperlipidemia   . Chronic chest pain   . Dyspnea     chronic  . GERD (gastroesophageal reflux disease)     h/o esophageal spasm  . Asthma   . Back pain   . Coronary artery disease     a. s/p BMS to RCA in 2002; b. s/p cutting balloon POBA ;   c. cath 6/12: oDx 80% (treated with repeat cutting balloon POBA), mLAD 50% with 30-40% at Dx, CFX 30%, pRCA 25% with patent stents;  d.  Lex MV 4/14:  Low Risk - EF 61%, inf scar with peri-infarct ischemia  . Myocardial infarction    Past Surgical History  Procedure Laterality Date  . Cardiac catheterization    . Neck surgery      multiple  . Back surgery      multiple  . Laminectomy    . Posterior laminectomy / decompression cervical spine    . Anterior cervical decomp/discectomy fusion    . Coronary angioplasty with stent placement       previous percutaneous intervention on the  RCA and the diagonal branch  . Esophagogastroduodenoscopy  03/23/2012    Procedure: ESOPHAGOGASTRODUODENOSCOPY (EGD);  Surgeon: Winfield Cunas., MD;  Location: [facility];  Service: Endoscopy;  Laterality: N/A;  . Savory dilation  03/23/2012    Procedure: SAVORY DILATION;  Surgeon: Winfield Cunas., MD;  Location: [facility];  Service: Endoscopy;  Laterality: N/A;  . Tonsillectomy    . Eye  surgery     Family History  Problem Relation Age of Onset  . Diabetes Father   . Heart disease Father   . Asthma Father   . Heart disease Mother     CABG hx age 10  . Colon cancer Son     hx   . Colitis Son     hx  . Crohn's disease Son    History  Substance Use Topics  . Smoking status: Never Smoker   . Smokeless tobacco: Not on file  . Alcohol Use: No    Review of Systems  Constitutional: Negative for fever and chills.  Respiratory: Negative for shortness of breath.   Cardiovascular: Negative for chest pain.  Gastrointestinal: Positive for blood in stool and abdominal distention. Negative for vomiting.  Genitourinary: Negative for dysuria.  Musculoskeletal: Negative for back pain, neck pain and neck stiffness.  Skin: Negative for rash.  Neurological: Negative for headaches.  All other systems reviewed and are negative.     Allergies  Iohexol; Budesonide-formoterol fumarate; Demerol; Ivp dye; Meperidine hcl; Naproxen; Simvastatin; Pregabalin; and Sulfonamide derivatives  Home Medications   Prior to Admission medications   Medication Sig Start Date End Date Taking? Authorizing Provider  acetaminophen (TYLENOL) 325 MG tablet Take 2 tablets (650 mg total) by mouth every  4 (four) hours as needed for headache or mild pain. 12/16/13  Yes Luke K Kilroy, PA-C  albuterol (PROVENTIL HFA;VENTOLIN HFA) 108 (90 BASE) MCG/ACT inhaler Inhale 2 puffs into the lungs 2 (two) times daily as needed for wheezing or shortness of breath.   Yes Historical Provider, MD  amLODipine (NORVASC) 5 MG tablet Take 5 mg by mouth daily.   Yes Historical Provider, MD  aspirin EC 81 MG tablet Take 81 mg by mouth daily.   Yes Historical Provider, MD  atorvastatin (LIPITOR) 40 MG tablet Take 40 mg by mouth daily.   Yes Historical Provider, MD  Cholecalciferol (VITAMIN D-3) 5000 UNITS TABS Take 5,000 Units by mouth 2 (two) times daily.   Yes Historical Provider, MD  clopidogrel (PLAVIX) 75 MG tablet Take 1  tablet (75 mg total) by mouth daily. 01/07/13  Yes Fay Records, MD  cyanocobalamin 1000 MCG tablet Take 100 mcg by mouth daily.   Yes Historical Provider, MD  doxazosin (CARDURA) 4 MG tablet Take 4 mg by mouth at bedtime.    Yes Historical Provider, MD  fluocinonide gel (LIDEX) 3.22 % Apply 1 application topically as needed (insect bites).   Yes Historical Provider, MD  fluticasone (FLOVENT HFA) 220 MCG/ACT inhaler Inhale 1 puff into the lungs 2 (two) times daily as needed (shortness of breath).    Yes Historical Provider, MD  furosemide (LASIX) 40 MG tablet Take 1 tablet (40 mg total) by mouth daily as needed for edema. 01/30/14  Yes Scott Joylene Draft, PA-C  nitroGLYCERIN (NITROSTAT) 0.4 MG SL tablet Place 0.4 mg under the tongue every 5 (five) minutes as needed for chest pain.   Yes Historical Provider, MD   BP 157/77  Pulse 57  Temp(Src) 98.9 F (37.2 C) (Oral)  Resp 18  Ht 5\' 9"  (1.753 m)  Wt 182 lb (82.555 kg)  BMI 26.86 kg/m2  SpO2 99% Physical Exam  Constitutional: He is oriented to person, place, and time. He appears well-developed and well-nourished.  HENT:  Head: Normocephalic and atraumatic.  Eyes: EOM are normal. Pupils are equal, round, and reactive to light.  Neck: Neck supple.  Cardiovascular: Regular rhythm and intact distal pulses.   Pulmonary/Chest: Effort normal. No respiratory distress.  Abdominal: Soft. Bowel sounds are normal. There is no tenderness. There is no rebound and no guarding.  Genitourinary:  Rectal: no stool in vault, nontender  Musculoskeletal: Normal range of motion. He exhibits no edema.  Neurological: He is alert and oriented to person, place, and time.  Skin: Skin is warm and dry.    ED Course  Procedures (including critical care time) Labs Review Labs Reviewed  CBC WITH DIFFERENTIAL - Abnormal; Notable for the following:    RBC 4.04 (*)    Hemoglobin 12.1 (*)    HCT 37.2 (*)    All other components within normal limits  COMPREHENSIVE  METABOLIC PANEL - Abnormal; Notable for the following:    GFR calc non Af Amer 78 (*)    All other components within normal limits  POC OCCULT BLOOD, ED - Abnormal; Notable for the following:    Fecal Occult Bld POSITIVE (*)    All other components within normal limits  PROTIME-INR  SAMPLE TO BLOOD BANK  TYPE AND SCREEN    Imaging Review Dg Abd Acute W/chest  03/03/2014   CLINICAL DATA:  Right-sided abdominal pain, bloody stools.  EXAM: ACUTE ABDOMEN SERIES (ABDOMEN 2 VIEW & CHEST 1 VIEW)  COMPARISON:  DG CHEST 2 VIEW  dated 12/14/2013; DG PELVIS 1-2 VIEWS dated 08/02/2012  FINDINGS: Cardiac silhouette is unremarkable, no, similar. Lungs are clear, no pleural effusions. No pneumothorax. Soft tissue planes and included osseous structures are nonsuspicious. ACDF. Moderate degenerative change of the shoulders.  Bowel gas pattern is nondilated and nonobstructive. No intra-abdominal mass effect, or free air. 12 mm calcification projects in the lower pole left kidney. Mid lumbar laminectomies with solid bony fusion. Phleboliths in the pelvis. Mild aorto iliac atherosclerosis.  IMPRESSION: No acute cardiopulmonary process.  Nonspecific bowel gas pattern.  12 mm probable left nephrolithiasis.   Electronically Signed   By: Elon Alas   On: 03/03/2014 01:05     EKG Interpretation None     IVFs, T/S, Protonix drip, MED consult for admit 12:07 AM Dr Reece Levy to admit - SBP 150s  MDM   Dx: GI Bleed  Presents with melena, on plavix, stable in the ED PPI drip T/S MED admit    Teressa Lower, MD 03/03/14 [phone]

## 2014-03-03 ENCOUNTER — Encounter (HOSPITAL_COMMUNITY): Payer: Self-pay | Admitting: Internal Medicine

## 2014-03-03 ENCOUNTER — Emergency Department (HOSPITAL_COMMUNITY): Payer: Medicare Other

## 2014-03-03 DIAGNOSIS — R531 Weakness: Secondary | ICD-10-CM | POA: Diagnosis present

## 2014-03-03 DIAGNOSIS — R5381 Other malaise: Secondary | ICD-10-CM

## 2014-03-03 DIAGNOSIS — K922 Gastrointestinal hemorrhage, unspecified: Secondary | ICD-10-CM | POA: Diagnosis present

## 2014-03-03 DIAGNOSIS — I10 Essential (primary) hypertension: Secondary | ICD-10-CM

## 2014-03-03 DIAGNOSIS — D649 Anemia, unspecified: Secondary | ICD-10-CM | POA: Diagnosis present

## 2014-03-03 DIAGNOSIS — R5383 Other fatigue: Secondary | ICD-10-CM

## 2014-03-03 HISTORY — DX: Gastrointestinal hemorrhage, unspecified: K92.2

## 2014-03-03 LAB — CBC
HCT: 33 % — ABNORMAL LOW (ref 39.0–52.0)
HCT: 35.3 % — ABNORMAL LOW (ref 39.0–52.0)
HEMOGLOBIN: 11.7 g/dL — AB (ref 13.0–17.0)
Hemoglobin: 10.9 g/dL — ABNORMAL LOW (ref 13.0–17.0)
MCH: 30.2 pg (ref 26.0–34.0)
MCH: 30.5 pg (ref 26.0–34.0)
MCHC: 33 g/dL (ref 30.0–36.0)
MCHC: 33.1 g/dL (ref 30.0–36.0)
MCV: 91.4 fL (ref 78.0–100.0)
MCV: 92.2 fL (ref 78.0–100.0)
PLATELETS: 193 10*3/uL (ref 150–400)
Platelets: 196 10*3/uL (ref 150–400)
RBC: 3.61 MIL/uL — ABNORMAL LOW (ref 4.22–5.81)
RBC: 3.83 MIL/uL — ABNORMAL LOW (ref 4.22–5.81)
RDW: 13.4 % (ref 11.5–15.5)
RDW: 13.3 % (ref 11.5–15.5)
WBC: 5.8 10*3/uL (ref 4.0–10.5)
WBC: 5.9 10*3/uL (ref 4.0–10.5)

## 2014-03-03 LAB — BASIC METABOLIC PANEL
BUN: 6 mg/dL (ref 6–23)
CALCIUM: 9 mg/dL (ref 8.4–10.5)
CO2: 27 mEq/L (ref 19–32)
Chloride: 104 mEq/L (ref 96–112)
Creatinine, Ser: 0.89 mg/dL (ref 0.50–1.35)
GFR calc Af Amer: >90 mL/min (ref >90)
GFR calc non Af Amer: 79 mL/min — ABNORMAL LOW (ref >90)
Glucose, Bld: 88 mg/dL (ref 70–99)
Potassium: 3.8 mEq/L (ref 3.7–5.3)
Sodium: 141 mEq/L (ref 137–147)

## 2014-03-03 LAB — TYPE AND SCREEN
ABO/RH(D): A POS
Antibody Screen: NEGATIVE

## 2014-03-03 MED ORDER — PNEUMOCOCCAL VAC POLYVALENT 25 MCG/0.5ML IJ INJ
0.5000 mL | INJECTION | INTRAMUSCULAR | Status: DC
  Filled 2014-03-03: qty 0.5

## 2014-03-03 MED ORDER — ACETAMINOPHEN 325 MG PO TABS: 650.0000 mg | ORAL_TABLET | Freq: Four times a day (QID) | ORAL | Status: DC | PRN

## 2014-03-03 MED ORDER — FLUTICASONE PROPIONATE HFA 220 MCG/ACT IN AERO: 1.0000 | INHALATION_SPRAY | Freq: Two times a day (BID) | RESPIRATORY_TRACT | Status: DC | PRN

## 2014-03-03 MED ORDER — ONDANSETRON HCL 4 MG PO TABS: 4.0000 mg | ORAL_TABLET | Freq: Four times a day (QID) | ORAL | Status: DC | PRN

## 2014-03-03 MED ORDER — SODIUM CHLORIDE 0.9 % IV SOLN: INTRAVENOUS | Status: DC

## 2014-03-03 MED ORDER — ACETAMINOPHEN 650 MG RE SUPP: 650.0000 mg | Freq: Four times a day (QID) | RECTAL | Status: DC | PRN

## 2014-03-03 MED ORDER — VITAMIN B-12 100 MCG PO TABS
100.0000 ug | ORAL_TABLET | Freq: Every day | ORAL | Status: DC
  Administered 2014-03-03 – 2014-03-05 (×2): 100 ug via ORAL
  Filled 2014-03-03 (×3): qty 1

## 2014-03-03 MED ORDER — AMLODIPINE BESYLATE 5 MG PO TABS
5.0000 mg | ORAL_TABLET | Freq: Every day | ORAL | Status: DC
  Administered 2014-03-03 – 2014-03-05 (×3): 5 mg via ORAL
  Filled 2014-03-03 (×3): qty 1

## 2014-03-03 MED ORDER — VITAMIN D3 25 MCG (1000 UNIT) PO TABS
5000.0000 [IU] | ORAL_TABLET | Freq: Two times a day (BID) | ORAL | Status: DC
  Administered 2014-03-03 – 2014-03-05 (×4): 5000 [IU] via ORAL
  Filled 2014-03-03 (×6): qty 5

## 2014-03-03 MED ORDER — ONDANSETRON HCL 4 MG/2ML IJ SOLN: 4.0000 mg | Freq: Four times a day (QID) | INTRAMUSCULAR | Status: DC | PRN

## 2014-03-03 MED ORDER — DOXAZOSIN MESYLATE 4 MG PO TABS
4.0000 mg | ORAL_TABLET | Freq: Once | ORAL | Status: AC
  Administered 2014-03-03: 4 mg via ORAL
  Filled 2014-03-03: qty 1

## 2014-03-03 MED ORDER — ALBUTEROL SULFATE (2.5 MG/3ML) 0.083% IN NEBU: 2.5000 mg | INHALATION_SOLUTION | Freq: Two times a day (BID) | RESPIRATORY_TRACT | Status: DC | PRN

## 2014-03-03 MED ORDER — ATORVASTATIN CALCIUM 40 MG PO TABS
40.0000 mg | ORAL_TABLET | Freq: Every day | ORAL | Status: DC
  Administered 2014-03-03 – 2014-03-05 (×2): 40 mg via ORAL
  Filled 2014-03-03 (×3): qty 1

## 2014-03-03 MED ORDER — DOXAZOSIN MESYLATE 4 MG PO TABS
4.0000 mg | ORAL_TABLET | Freq: Every day | ORAL | Status: DC
  Administered 2014-03-03 – 2014-03-04 (×2): 4 mg via ORAL
  Filled 2014-03-03 (×3): qty 1

## 2014-03-03 MED ORDER — FUROSEMIDE 40 MG PO TABS: 40.0000 mg | ORAL_TABLET | Freq: Every day | ORAL | Status: DC | PRN

## 2014-03-03 MED ORDER — NITROGLYCERIN 0.4 MG SL SUBL: 0.4000 mg | SUBLINGUAL_TABLET | SUBLINGUAL | Status: DC | PRN

## 2014-03-03 MED ORDER — PANTOPRAZOLE SODIUM 40 MG IV SOLR: 40.0000 mg | Freq: Two times a day (BID) | INTRAVENOUS | Status: DC

## 2014-03-03 NOTE — Progress Notes (Signed)
OT Cancellation Note  Patient Details Name: [name] MRN: [id number] DOB: 11/09/1930   Cancelled Treatment:    Reason Eval/Treat Not Completed: Patient not medically ready. Md requesting OT to start 03/04/14  Peri Maris Pager: [phone]  03/03/2014, 3:00 PM

## 2014-03-03 NOTE — ED Notes (Signed)
Transporting patient to new room assignment. 

## 2014-03-03 NOTE — H&P (Signed)
Patient's PCP: Geroge Baseman, [facility]  Chief Complaint: GI bleed and black stool  History of Present Illness: [name] is a 80 y.o. Caucasian male with history of hypertension, hyperlipidemia, GERD, coronary artery disease status post stent placement most recent cardiac catheter on 01/13/2014, asthma, chronic low back pain, and status post esophageal dilatation in the past who presents with the above complaints.  Patient reports that his symptoms started initially yesterday when he had poor appetite and didn't eat well.  This morning when he woke up and had a bowel movement he had black colored stools and when he wiped he had blood clots.  Since his bowel movement this morning he has had 2 more episodes where he wiped and had blood clots without a bowel movement.  He also had his toilet bowl stained with fresh blood.  As a result he presented to the emergency department for further evaluation.  He reports feeling weak and dizzy and had to be helped when he got to the emergency department.  He denies any recent fevers, chills, chest pain, shortness of breath, headaches or vision changes.  He did complain of initially right sided abdominal pain but then had generalized abdominal pain.  Review of Systems: All systems reviewed with the patient and positive as per history of present illness, otherwise all other systems are negative.  Past Medical History  Diagnosis Date  . Hypertension   . Hyperlipidemia   . Chronic chest pain   . Dyspnea     chronic  . GERD (gastroesophageal reflux disease)     h/o esophageal spasm  . Asthma   . Back pain   . Coronary artery disease     a. s/p BMS to RCA in 2002; b. s/p cutting balloon POBA ;   c. cath 6/12: oDx 80% (treated with repeat cutting balloon POBA), mLAD 50% with 30-40% at Dx, CFX 30%, pRCA 25% with patent stents;  d.  Lex MV 4/14:  Low Risk - EF 61%, inf scar with peri-infarct ischemia  . Myocardial infarction    Past  Surgical History  Procedure Laterality Date  . Cardiac catheterization    . Neck surgery      multiple  . Back surgery      multiple  . Laminectomy    . Posterior laminectomy / decompression cervical spine    . Anterior cervical decomp/discectomy fusion    . Coronary angioplasty with stent placement       previous percutaneous intervention on the  RCA and the diagonal branch  . Esophagogastroduodenoscopy  03/23/2012    Procedure: ESOPHAGOGASTRODUODENOSCOPY (EGD);  Surgeon: Winfield Cunas., MD;  Location: [facility];  Service: Endoscopy;  Laterality: N/A;  . Savory dilation  03/23/2012    Procedure: SAVORY DILATION;  Surgeon: Winfield Cunas., MD;  Location: [facility];  Service: Endoscopy;  Laterality: N/A;  . Tonsillectomy    . Eye surgery     Family History  Problem Relation Age of Onset  . Diabetes Father   . Heart disease Father   . Asthma Father   . Heart disease Mother     CABG hx age 33  . Colon cancer Son     hx   . Colitis Son     hx  . Crohn's disease Son    History   Social History  . Marital Status: Married    Spouse Name: N/A    Number of Children: N/A  . Years of Education:  N/A   Occupational History  . retired     Clinical research associate   Social History Main Topics  . Smoking status: Never Smoker   . Smokeless tobacco: Not on file  . Alcohol Use: No  . Drug Use: No  . Sexual Activity: No   Other Topics Concern  . Not on file   Social History Narrative   Lives in [place]. Generaly active around the house and walks his dog without chest pain or SOB.   Allergies: Iohexol; Budesonide-formoterol fumarate; Demerol; Ivp dye; Meperidine hcl; Naproxen; Simvastatin; Pregabalin; and Sulfonamide derivatives  Home Meds: Prior to Admission medications   Medication Sig Start Date End Date Taking? Authorizing Provider  acetaminophen (TYLENOL) 325 MG tablet Take 2 tablets (650 mg total) by mouth every 4 (four) hours as needed for headache or mild  pain. 12/16/13  Yes Luke K Kilroy, PA-C  albuterol (PROVENTIL HFA;VENTOLIN HFA) 108 (90 BASE) MCG/ACT inhaler Inhale 2 puffs into the lungs 2 (two) times daily as needed for wheezing or shortness of breath.   Yes Historical Provider, MD  amLODipine (NORVASC) 5 MG tablet Take 5 mg by mouth daily.   Yes Historical Provider, MD  aspirin EC 81 MG tablet Take 81 mg by mouth daily.   Yes Historical Provider, MD  atorvastatin (LIPITOR) 40 MG tablet Take 40 mg by mouth daily.   Yes Historical Provider, MD  Cholecalciferol (VITAMIN D-3) 5000 UNITS TABS Take 5,000 Units by mouth 2 (two) times daily.   Yes Historical Provider, MD  clopidogrel (PLAVIX) 75 MG tablet Take 1 tablet (75 mg total) by mouth daily. 01/07/13  Yes Fay Records, MD  cyanocobalamin 1000 MCG tablet Take 100 mcg by mouth daily.   Yes Historical Provider, MD  doxazosin (CARDURA) 4 MG tablet Take 4 mg by mouth at bedtime.    Yes Historical Provider, MD  fluocinonide gel (LIDEX) 8.41 % Apply 1 application topically as needed (insect bites).   Yes Historical Provider, MD  fluticasone (FLOVENT HFA) 220 MCG/ACT inhaler Inhale 1 puff into the lungs 2 (two) times daily as needed (shortness of breath).    Yes Historical Provider, MD  furosemide (LASIX) 40 MG tablet Take 1 tablet (40 mg total) by mouth daily as needed for edema. 01/30/14  Yes Scott Joylene Draft, PA-C  nitroGLYCERIN (NITROSTAT) 0.4 MG SL tablet Place 0.4 mg under the tongue every 5 (five) minutes as needed for chest pain.   Yes Historical Provider, MD    Physical Exam: Blood pressure 155/68, pulse 56, temperature 98.9 F (37.2 C), temperature source Oral, resp. rate 12, height 5\' 9"  (1.753 m), weight 82.555 kg (182 lb), SpO2 99.00%. General: Awake, Oriented x3, No acute distress. HEENT: EOMI, Moist mucous membranes Neck: Supple CV: S1 and S2 Lungs: Clear to ascultation bilaterally Abdomen: Soft, generalized tenderness no guarding, Nondistended, +bowel sounds. Ext: Good pulses. 2+  edema. No clubbing or cyanosis noted. Neuro: Cranial Nerves II-XII grossly intact. Has 5/5 motor strength in upper and lower extremities.  Lab results:  Recent Labs  03/02/14 2118  NA 138  K 4.0  CL 101  CO2 25  GLUCOSE 99  BUN 7  CREATININE 0.91  CALCIUM 9.4    Recent Labs  03/02/14 2118  AST 20  ALT 12  ALKPHOS 68  BILITOT 0.6  PROT 6.9  ALBUMIN 3.8   No results found for this basename: LIPASE, AMYLASE,  in the last 72 hours  Recent Labs  03/02/14 2118  WBC 6.7  NEUTROABS  4.0  HGB 12.1*  HCT 37.2*  MCV 92.1  PLT 220   No results found for this basename: CKTOTAL, CKMB, CKMBINDEX, TROPONINI,  in the last 72 hours No components found with this basename: POCBNP,  No results found for this basename: DDIMER,  in the last 72 hours No results found for this basename: HGBA1C,  in the last 72 hours No results found for this basename: CHOL, HDL, LDLCALC, TRIG, CHOLHDL, LDLDIRECT,  in the last 72 hours No results found for this basename: TSH, T4TOTAL, FREET3, T3FREE, THYROIDAB,  in the last 72 hours No results found for this basename: VITAMINB12, FOLATE, FERRITIN, TIBC, IRON, RETICCTPCT,  in the last 72 hours Imaging results:  No results found. Other results: EKG: Sinus with heart rate of 56.  Assessment & Plan by Problem: GI bleed Presumed upper GI bleed in origin.  Patient started on IV pantoprazole which will be continued twice daily.  Patient's gastroenterologist is Dr. Oletta Lamas, will need consultation with GI in the morning.  Will have the patient n.p.o. until GI evaluation.  Discontinue aspirin and Plavix for now.  Minimize fluids given patient's cardiac history.  Patient is hemodynamically stable at this time.  Abdominal pain Unclear etiology.  Abdominal x-ray showed benign findings.  If the patient has persistent symptoms consider CT.  Anemia Mild.  Maybe due to acute blood loss from GI bleed.  Continue to cycle CBC.  Hypertension Continue home medications.   Stable.  Hyperlipidemia Continue home medications.  Coronary artery disease Continue home medications except aspirin and Plavix.  Uncertain if patient has component of diastolic heart failure, minimize fluids.  Generalized weakness Requests physical therapy evaluation in the morning.  BPH Continue home medications.  Prophylaxis SCDs, given concern for GI bleed no heparin.  CODE STATUS Full code.  Disposition Admit the patient to Medical bed as inpatient.  Time spent on admission, talking to the patient, and coordinating care was: 50 mins.  Bynum Bellows, MD 03/03/2014, 1:06 AM

## 2014-03-03 NOTE — Consult Note (Signed)
[facility] Gastroenterology Consultation Note  Referring Provider: Dr. Verneita Griffes [facility]) Primary Care Physician:  Geroge Baseman Primary Gastroenterologist:  Dr. Laurence Spates  Reason for Consultation:  Blood in stool  HPI: [name] is a 80 y.o. male admitted for, and whom we've been asked to see on behalf of, blood in stool.  Starting yesterday afternoon, patient with some small bits of black stool, followed thereafter by voluminous bright red hematochezia and blood clots.  Chronic lower abdominal pain for at least 3 years, unclear etiology, evaluation by Dr. Oletta Lamas unrevealing per patient.  Chronic solid-predominant dysphagia, persists despite two prior endoscopic dilatations.  Patient is on clopidigrel chronically for heart disease with stents.  He had endoscopy by Dr. Oletta Lamas in June 2013 showing patent Schatzki's ring, dilated to 26mm, and colonoscopy by Dr. Sharlett Iles in 2011 showed severe left-sided diverticulosis, exam for hematochezia at that time as well.   Past Medical History  Diagnosis Date  . Hypertension   . Hyperlipidemia   . Chronic chest pain   . Dyspnea     chronic  . GERD (gastroesophageal reflux disease)     h/o esophageal spasm  . Asthma   . Back pain   . Coronary artery disease     a. s/p BMS to RCA in 2002; b. s/p cutting balloon POBA ;   c. cath 6/12: oDx 80% (treated with repeat cutting balloon POBA), mLAD 50% with 30-40% at Dx, CFX 30%, pRCA 25% with patent stents;  d.  Lex MV 4/14:  Low Risk - EF 61%, inf scar with peri-infarct ischemia  . Myocardial infarction     Past Surgical History  Procedure Laterality Date  . Cardiac catheterization    . Neck surgery      multiple  . Back surgery      multiple  . Laminectomy    . Posterior laminectomy / decompression cervical spine    . Anterior cervical decomp/discectomy fusion    . Coronary angioplasty with stent placement       previous percutaneous intervention on the  RCA and the diagonal branch  .  Esophagogastroduodenoscopy  03/23/2012    Procedure: ESOPHAGOGASTRODUODENOSCOPY (EGD);  Surgeon: Winfield Cunas., MD;  Location: [facility];  Service: Endoscopy;  Laterality: N/A;  . Savory dilation  03/23/2012    Procedure: SAVORY DILATION;  Surgeon: Winfield Cunas., MD;  Location: [facility];  Service: Endoscopy;  Laterality: N/A;  . Tonsillectomy    . Eye surgery      Prior to Admission medications   Medication Sig Start Date End Date Taking? Authorizing Provider  acetaminophen (TYLENOL) 325 MG tablet Take 2 tablets (650 mg total) by mouth every 4 (four) hours as needed for headache or mild pain. 12/16/13  Yes Luke K Kilroy, PA-C  albuterol (PROVENTIL HFA;VENTOLIN HFA) 108 (90 BASE) MCG/ACT inhaler Inhale 2 puffs into the lungs 2 (two) times daily as needed for wheezing or shortness of breath.   Yes Historical Provider, MD  amLODipine (NORVASC) 5 MG tablet Take 5 mg by mouth daily.   Yes Historical Provider, MD  aspirin EC 81 MG tablet Take 81 mg by mouth daily.   Yes Historical Provider, MD  atorvastatin (LIPITOR) 40 MG tablet Take 40 mg by mouth daily.   Yes Historical Provider, MD  Cholecalciferol (VITAMIN D-3) 5000 UNITS TABS Take 5,000 Units by mouth 2 (two) times daily.   Yes Historical Provider, MD  clopidogrel (PLAVIX) 75 MG tablet Take 1 tablet (75 mg total)  by mouth daily. 01/07/13  Yes Fay Records, MD  cyanocobalamin 1000 MCG tablet Take 100 mcg by mouth daily.   Yes Historical Provider, MD  doxazosin (CARDURA) 4 MG tablet Take 4 mg by mouth at bedtime.    Yes Historical Provider, MD  fluocinonide gel (LIDEX) [id number] % Apply 1 application topically as needed (insect bites).   Yes Historical Provider, MD  fluticasone (FLOVENT HFA) 220 MCG/ACT inhaler Inhale 1 puff into the lungs 2 (two) times daily as needed (shortness of breath).    Yes Historical Provider, MD  furosemide (LASIX) 40 MG tablet Take 1 tablet (40 mg total) by mouth daily as needed for edema. 01/30/14  Yes Scott Joylene Draft, PA-C  nitroGLYCERIN (NITROSTAT) 0.4 MG SL tablet Place 0.4 mg under the tongue every 5 (five) minutes as needed for chest pain.   Yes Historical Provider, MD    Current Facility-Administered Medications  Medication Dose Route Frequency Provider Last Rate Last Dose  . acetaminophen (TYLENOL) tablet 650 mg  650 mg Oral Q6H PRN Bynum Bellows, MD       Or  . acetaminophen (TYLENOL) suppository 650 mg  650 mg Rectal Q6H PRN Bynum Bellows, MD      . albuterol (PROVENTIL) (2.5 MG/3ML) 0.083% nebulizer solution 2.5 mg  2.5 mg Inhalation BID PRN Bynum Bellows, MD      . amLODipine (NORVASC) tablet 5 mg  5 mg Oral Daily Bynum Bellows, MD   5 mg at 03/03/14 1106  . atorvastatin (LIPITOR) tablet 40 mg  40 mg Oral Daily Bynum Bellows, MD   40 mg at 03/03/14 1106  . cholecalciferol (VITAMIN D) tablet 5,000 Units  5,000 Units Oral BID Bynum Bellows, MD   5,000 Units at 03/03/14 1100  . doxazosin (CARDURA) tablet 4 mg  4 mg Oral QHS Bynum Bellows, MD      . fluticasone (FLOVENT HFA) 220 MCG/ACT inhaler 1 puff  1 puff Inhalation BID PRN Bynum Bellows, MD      . furosemide (LASIX) tablet 40 mg  40 mg Oral Daily PRN Bynum Bellows, MD      . nitroGLYCERIN (NITROSTAT) SL tablet 0.4 mg  0.4 mg Sublingual Q5 min PRN Bynum Bellows, MD      . ondansetron [facility]) tablet 4 mg  4 mg Oral Q6H PRN Bynum Bellows, MD       Or  . ondansetron (ZOFRAN) injection 4 mg  4 mg Intravenous Q6H PRN Bynum Bellows, MD      . Derrill Memo ON 03/06/2014] pantoprazole (PROTONIX) injection 40 mg  40 mg Intravenous Q12H Teressa Lower, MD      . Derrill Memo ON 03/06/2014] pantoprazole (PROTONIX) injection 40 mg  40 mg Intravenous Q12H Juanda Chance Rena Lara, [facility]      . [START ON 03/04/2014] pneumococcal 23 valent vaccine (PNU-IMMUNE) injection 0.5 mL  0.5 mL Intramuscular Tomorrow-1000 Nita Sells, MD      . vitamin B-12 (CYANOCOBALAMIN) tablet 100 mcg  100 mcg Oral Daily Bynum Bellows, MD   100 mcg at 03/03/14 1100    Allergies  as of 03/02/2014 - Review Complete 03/02/2014  Allergen Reaction Noted  . Iohexol Shortness Of Breath and Itching 01/09/2012  . Budesonide-formoterol fumarate Swelling   . Demerol [meperidine] Other (See Comments) 01/18/2013  . Ivp dye [iodinated diagnostic agents]  02/02/2012  . Meperidine hcl Other (See Comments) 10/20/2008  . Naproxen Swelling 11/05/2012  . Simvastatin Other (See  Comments) 10/20/2008  . Pregabalin Rash 11/05/2012  . Sulfonamide derivatives Rash 10/20/2008    Family History  Problem Relation Age of Onset  . Diabetes Father   . Heart disease Father   . Asthma Father   . Heart disease Mother     CABG hx age 52  . Colon cancer Son     hx   . Colitis Son     hx  . Crohn's disease Son     History   Social History  . Marital Status: Married    Spouse Name: N/A    Number of Children: N/A  . Years of Education: N/A   Occupational History  . retired     Clinical research associate   Social History Main Topics  . Smoking status: Never Smoker   . Smokeless tobacco: Not on file  . Alcohol Use: No  . Drug Use: No  . Sexual Activity: No   Other Topics Concern  . Not on file   Social History Narrative   Lives in [place]. Generaly active around the house and walks his dog without chest pain or SOB.    Review of Systems: As per HPI, all others negative  Physical Exam: Vital signs in last 24 hours: Temp:  [98.2 F (36.8 C)-98.9 F (37.2 C)] 98.4 F (36.9 C) (05/18 1307) Pulse Rate:  [54-63] 55 (05/18 1307) Resp:  [12-18] 16 (05/18 1307) BP: (136-160)/(65-89) 160/89 mmHg (05/18 1307) SpO2:  [96 %-99 %] 97 % (05/18 1307) Weight:  [82.555 kg (182 lb)-85.276 kg (188 lb)] 85.276 kg (188 lb) (05/18 0143) Last BM Date: 03/02/14 General:   Alert,  Elderly-appearing but is in no acute distress Head:  Normocephalic and atraumatic. Eyes:  Sclera clear, no icterus.   Conjunctiva pink. Ears:  Normal auditory acuity. Nose:  No deformity, discharge,  or  lesions. Mouth:  No deformity or lesions.  Oropharynx pink & moist. Neck:  Supple; no masses or thyromegaly. Lungs:  Clear throughout to auscultation.   No wheezes, crackles, or rhonchi. No acute distress. Heart:  Regular rate and rhythm; no murmurs, clicks, rubs,  or gallops. Abdomen:  Soft, nondistended. Generalized chronic lower and periumbilical tenderness without peritonitis; No masses, hepatosplenomegaly or hernias noted. Normal bowel sounds, without guarding, and without rebound.     Msk:  Diffusely atrophic, otherwisesSymmetrical without gross deformities. Normal posture. Pulses:  Normal pulses noted. Extremities:  Without clubbing or edema. Neurologic:  Alert and  oriented x4;  Diffusely weak, otherwise grossly normal neurologically. Skin:  Scattered ecchymoses, otherwise intact without significant lesions or rashes. Psych:  Alert and cooperative. Normal mood and affect.   Lab Results:  Recent Labs  03/02/14 2118 03/03/14 0617  WBC 6.7 5.8  HGB 12.1* 10.9*  HCT 37.2* 33.0*  PLT 220 193   BMET  Recent Labs  03/02/14 2118 03/03/14 0617  NA 138 141  K 4.0 3.8  CL 101 104  CO2 25 27  GLUCOSE 99 88  BUN 7 6  CREATININE 0.91 0.89  CALCIUM 9.4 9.0   LFT  Recent Labs  03/02/14 2118  PROT 6.9  ALBUMIN 3.8  AST 20  ALT 12  ALKPHOS 68  BILITOT 0.6   PT/INR  Recent Labs  03/02/14 2118  LABPROT 12.7  INR 0.97    Studies/Results: Dg Abd Acute W/chest  03/03/2014   CLINICAL DATA:  Right-sided abdominal pain, bloody stools.  EXAM: ACUTE ABDOMEN SERIES (ABDOMEN 2 VIEW & CHEST 1 VIEW)  COMPARISON:  DG CHEST 2  VIEW dated 12/14/2013; DG PELVIS 1-2 VIEWS dated 08/02/2012  FINDINGS: Cardiac silhouette is unremarkable, no, similar. Lungs are clear, no pleural effusions. No pneumothorax. Soft tissue planes and included osseous structures are nonsuspicious. ACDF. Moderate degenerative change of the shoulders.  Bowel gas pattern is nondilated and nonobstructive. No  intra-abdominal mass effect, or free air. 12 mm calcification projects in the lower pole left kidney. Mid lumbar laminectomies with solid bony fusion. Phleboliths in the pelvis. Mild aorto iliac atherosclerosis.  IMPRESSION: No acute cardiopulmonary process.  Nonspecific bowel gas pattern.  12 mm probable left nephrolithiasis.   Electronically Signed   By: Elon Alas   On: 03/03/2014 01:05    Impression:  1.  Blood in stool.  Suspect diverticular bleeding.  Black stool, normal BUN level notwithstanding, raises possibility of upper GI tract source, in setting of chronic clopidigrel therapy.  Plan:  1.  Clear liquid diet now. 2.  NPO after midnight. 3.  PPI. 4.  Hold NSAIDs/antiplatelets/anticoagulants as clinically feasible. 5.  Endoscopy tomorrow. 6.  If endoscopy is unrevealing, would manage supportively, but would consider tagged RBC study if patient has rampant rebleeding. 7.  Will follow.   LOS: 1 day   Arta Silence  03/03/2014, 1:41 PM

## 2014-03-03 NOTE — Progress Notes (Signed)
PT Cancellation Note  Patient Details Name: [name] MRN: [id number] DOB: [birth year]   Cancelled Treatment:    Reason Eval/Treat Not Completed: Medical issues which prohibited therapy (per MD, start tomorrow.)   Shella Maxim [facility] 03/03/2014, 10:49 AM Tresa Endo PT [phone]

## 2014-03-03 NOTE — ED Notes (Signed)
Admitting physician at bedside

## 2014-03-03 NOTE — ED Notes (Signed)
Attempted report X1

## 2014-03-03 NOTE — Progress Notes (Signed)
8:07 AM I agree with HPI/GPe and A/P per Dr. Reece Levy   82 y/r ?, known h/o CAD-Recent myoview 01/13/14 for CP-Cath showed Single vessel disease,  HLD, Htn, chronic CP, Gerd, syncopal episodes and had event monitor in 2013, presenting 5/18 am with 3-4 stool which were both melanotic and bright red.  This prgressed to frank blood per rectum and patient decided to come to [facility] for an evaluation.  .  Known h/o hemorrhoids.  No Goodys or NSAIDs.  Prior colonoscopy ~ 2 yrs ago but doesn't recall details  Denies dizzyness, HA , CP, N,SOB at present  HEENT alert oriented in NAD  CHEST clear, no added sound CARDIAC s1 s2 no m/r/g ABDOMEN soft, Nt, ND NEURO  intact  Patient Active Problem List   Diagnosis Date Noted  . GI bleed 03/03/2014  . Anemia 03/03/2014  . Generalized weakness 03/03/2014  . HTN (hypertension) 12/20/2013  . Chest pain, mid sternal 01/18/2013  . Chest pain with moderate risk of acute coronary syndrome 04/16/2012  . ESOPHAGEAL STRICTURE 09/20/2010  . ORTHOSTATIC DIZZINESS 08/30/2010  . IBS 08/26/2010  . RECTAL BLEEDING 08/26/2010  . RECTAL PAIN 08/26/2010  . ARTHRITIS 08/26/2010  . LOSS OF APPETITE 08/26/2010  . NAUSEA AND VOMITING 08/26/2010  . OTHER DYSPHAGIA 08/26/2010  . Abdominal pain, generalized 08/26/2010  . COLONIC POLYPS, HX OF 08/26/2010  . ANAL FISSURE, HX OF 08/26/2010  . PALPITATIONS, HX OF 08/18/2010  . PERIPHERAL NEUROPATHY 05/01/2009  . MYOCARDIAL INFARCTION 05/01/2009  . ALLERGIC RHINITIS, SEASONAL 05/01/2009  . DEPRESSION, HX OF 05/01/2009  . Personal history of other diseases of digestive disease 05/01/2009  . NEPHROLITHIASIS, HX OF 05/01/2009  . BENIGN PROSTATIC HYPERTROPHY, HX OF 05/01/2009  . LAMINECTOMY, HX OF 05/01/2009  . HYPERLIPIDEMIA-MIXED 10/20/2008  . ESSENTIAL HYPERTENSION 10/20/2008  . CAD- RCA BMS '02, Dx HSRA June 2012 10/20/2008  . ASTHMA, UNSPECIFIED, UNSPECIFIED STATUS 10/20/2008  . GERD 10/20/2008  . DYSPNEA 10/20/2008     Porbable LOwer GIB as dark stool, no BUN/Creat elevation, H/o brb component-Hb has dropped form 13 on admit to ~10 Recheck q12 Have paged [facility] Dr. Paulita Fujita for recs and consultation Continue meds with sips-NPO otherwise until seen by GI Will probably need Bowel prep and colonoscopy in 1-2 days  Verneita Griffes, MD [facility] (P) [phone]

## 2014-03-04 ENCOUNTER — Encounter (HOSPITAL_COMMUNITY): Payer: Self-pay

## 2014-03-04 ENCOUNTER — Encounter (HOSPITAL_COMMUNITY)
Admission: EM | Disposition: A | Discharge status: Home / Self Care | Payer: Self-pay | Source: Home / Self Care | Attending: Family Medicine

## 2014-03-04 ENCOUNTER — Inpatient Hospital Stay (HOSPITAL_COMMUNITY): Payer: Medicare Other | Admitting: Certified Registered Nurse Anesthetist

## 2014-03-04 ENCOUNTER — Encounter (HOSPITAL_COMMUNITY): Payer: Medicare Other | Admitting: Certified Registered Nurse Anesthetist

## 2014-03-04 HISTORY — PX: ESOPHAGOGASTRODUODENOSCOPY: SHX5428

## 2014-03-04 LAB — CBC
HCT: 37.7 % — ABNORMAL LOW (ref 39.0–52.0)
HCT: 38.8 % — ABNORMAL LOW (ref 39.0–52.0)
HEMOGLOBIN: 12.7 g/dL — AB (ref 13.0–17.0)
Hemoglobin: 12.4 g/dL — ABNORMAL LOW (ref 13.0–17.0)
MCH: 30.2 pg (ref 26.0–34.0)
MCH: 30.2 pg (ref 26.0–34.0)
MCHC: 32.9 g/dL (ref 30.0–36.0)
MCHC: 32.7 g/dL (ref 30.0–36.0)
MCV: 91.7 fL (ref 78.0–100.0)
MCV: 92.2 fL (ref 78.0–100.0)
PLATELETS: 219 10*3/uL (ref 150–400)
Platelets: 194 10*3/uL (ref 150–400)
RBC: 4.11 MIL/uL — AB (ref 4.22–5.81)
RBC: 4.21 MIL/uL — ABNORMAL LOW (ref 4.22–5.81)
RDW: 13.4 % (ref 11.5–15.5)
RDW: 13.3 % (ref 11.5–15.5)
WBC: 5.8 10*3/uL (ref 4.0–10.5)
WBC: 5.8 10*3/uL (ref 4.0–10.5)

## 2014-03-04 SURGERY — EGD (ESOPHAGOGASTRODUODENOSCOPY)
Anesthesia: Monitor Anesthesia Care

## 2014-03-04 MED ORDER — MIDAZOLAM HCL 5 MG/ML IJ SOLN
INTRAMUSCULAR | Status: AC
  Filled 2014-03-04: qty 1

## 2014-03-04 MED ORDER — FENTANYL CITRATE 0.05 MG/ML IJ SOLN: 25.0000 ug | INTRAMUSCULAR | Status: DC | PRN

## 2014-03-04 MED ORDER — SODIUM CHLORIDE 0.9 % IV SOLN
INTRAVENOUS | Status: DC | PRN
  Administered 2014-03-04: 11:00:00 via INTRAVENOUS

## 2014-03-04 MED ORDER — PANTOPRAZOLE SODIUM 40 MG PO TBEC
40.0000 mg | DELAYED_RELEASE_TABLET | Freq: Two times a day (BID) | ORAL | Status: DC
  Administered 2014-03-04 – 2014-03-05 (×2): 40 mg via ORAL
  Filled 2014-03-04 (×2): qty 1

## 2014-03-04 MED ORDER — FENTANYL CITRATE 0.05 MG/ML IJ SOLN
INTRAMUSCULAR | Status: AC
  Filled 2014-03-04: qty 2

## 2014-03-04 MED ORDER — PROPOFOL INFUSION 10 MG/ML OPTIME
INTRAVENOUS | Status: DC | PRN
  Administered 2014-03-04: 120 ug/kg/min via INTRAVENOUS

## 2014-03-04 NOTE — Transfer of Care (Signed)
Immediate Anesthesia Transfer of Care Note  Patient: [name]  Procedure(s) Performed: Procedure(s): ESOPHAGOGASTRODUODENOSCOPY (EGD) (N/A)  Patient Location: Endoscopy Unit  Anesthesia Type:MAC  Level of Consciousness: awake, alert  and oriented  Airway & Oxygen Therapy: Patient Spontanous Breathing and Patient connected to nasal cannula oxygen  Post-op Assessment: Report given to PACU RN, Post -op Vital signs reviewed and stable and Patient moving all extremities X 4  Post vital signs: Reviewed and stable  Complications: No apparent anesthesia complications

## 2014-03-04 NOTE — Evaluation (Signed)
Physical Therapy Evaluation Patient Details Name: [name] MRN: [id number] DOB: November 22, 1930 Today's Date: 03/04/2014   History of Present Illness  [name] is a 80 y.o. Caucasian male with history of hypertension, hyperlipidemia, GERD, coronary artery disease status post stent placement most recent cardiac catheter on 01/13/2014, asthma, chronic low back pain, and status post esophageal dilatation in the past who presents with bloody stool on 5/17.  Clinical Impression  Pt admitted with above. Pt functioning near baseline. Suspect pt to be safe to d/c home with spouse and son once medically stable. Aware pt is going for diagnostic testing, acute PT with con't to follow to ensure mobility progression.     Follow Up Recommendations No PT follow up;Supervision - Intermittent    Equipment Recommendations  None recommended by PT    Recommendations for Other Services       Precautions / Restrictions Precautions Precautions: Fall Restrictions Weight Bearing Restrictions: No      Mobility  Bed Mobility Overal bed mobility: Modified Independent             General bed mobility comments: used bed rail, definite use of hands  Transfers Overall transfer level: Needs assistance Equipment used: None Transfers: Sit to/from Stand Sit to Stand: Supervision         General transfer comment: pt with safe technique, no episodes of LOB  Ambulation/Gait Ambulation/Gait assistance: Min guard Ambulation Distance (Feet): 150 Feet Assistive device: None Gait Pattern/deviations: Step-through pattern;Decreased stride length Gait velocity: average for age   General Gait Details: pt with minimal stagger, pt reports "I stagger a little bit but with my cane I don't" no episodes of LOB  Stairs            Wheelchair Mobility    Modified Rankin (Stroke Patients Only)       Balance                                             Pertinent  Vitals/Pain Abdominal pain, did not rate    Home Living Family/patient expects to be discharged to:: Private residence Living Arrangements: Spouse/significant other;Children Available Help at Discharge: Family;Available 24 hours/day Type of Home: House Home Access: Stairs to enter   [employer] of Steps: 1 Home Layout: One level (has 1 step in kitchen) Home Equipment: Cane - single point;Hand held shower head Additional Comments: desires to get grab bars and shower seat for walk in shower    Prior Function Level of Independence: Independent         Comments: uses cane when amb outside     Hand Dominance   Dominant Hand: Right    Extremity/Trunk Assessment   Upper Extremity Assessment: Overall WFL for tasks assessed           Lower Extremity Assessment: Overall WFL for tasks assessed      Cervical / Trunk Assessment: Kyphotic (h/o cervical fusion)  Communication   Communication: No difficulties  Cognition Arousal/Alertness: Awake/alert Behavior During Therapy: WFL for tasks assessed/performed Overall Cognitive Status: Within Functional Limits for tasks assessed                      General Comments      Exercises        Assessment/Plan    PT Assessment Patient needs continued PT services  PT Diagnosis Difficulty walking  PT Problem List Decreased activity tolerance;Decreased mobility  PT Treatment Interventions Gait training;Stair training;Balance training;Therapeutic exercise   PT Goals (Current goals can be found in the Care Plan section) Acute Rehab PT Goals Patient Stated Goal: home soon PT Goal Formulation: With patient Time For Goal Achievement: 03/11/14 Potential to Achieve Goals: Good    Frequency Min 3X/week   Barriers to discharge        Co-evaluation               End of Session Equipment Utilized During Treatment: Gait belt Activity Tolerance: Patient tolerated treatment well Patient left: in  chair;with call bell/phone within reach Nurse Communication: Mobility status         Time: 6237-6283 PT Time Calculation (min): 21 min   Charges:   PT Evaluation $Initial PT Evaluation Tier I: 1 Procedure PT Treatments $Gait Training: 8-22 mins   PT G CodesKoleen Distance Leesa Leifheit 03/04/2014, 7:54 AM  Kittie Plater, PT, DPT Pager #: [phone] Office #: [phone]

## 2014-03-04 NOTE — Progress Notes (Signed)
Note: This document was prepared with digital dictation and possible smart phrase technology. Any transcriptional errors that result from this process are unintentional.   [name]:[id number] DOB: [birth year] DOA: 03/02/2014 PCP: Geroge Baseman  Brief narrative:  53 y/r ?, known h/o CAD-Recent myoview 01/13/14 for CP-Cath showed Single vessel disease,  HLD, Htn, chronic CP, Gerd, syncopal episodes and had event monitor in 2013, presenting 5/18 am with 3-4 stool which were both melanotic and bright red.  This prgressed to frank blood per rectum and patient decided to come to Hospital for an evaluation.  .  Known h/o hemorrhoids.  No Goodys or NSAIDs.  Prior Colonoscopy by Dr. Sharlett Iles in 2011 showed severe left-sided diverticulosis, exam for hematochezia at that time as well.Endo Dr. Oletta Lamas in June 2013 showing patent Schatzki's ring, dilated to 61mm,      Past medical history-As per Problem list Chart reviewed as below- Reviewed   Consultants:  GI  Procedures:  none  Antibiotics:  none   Subjective  Well.  Some mild abd discomfort No dark stool No cp or sob.  Denies dizzyness, HA , CP, N,SOB at present   Objective    Interim History: none  Telemetry: nad   Objective: Filed Vitals:   03/04/14 1205 03/04/14 1210 03/04/14 1215 03/04/14 1220  BP: 169/93 176/86 167/151 171/85  Pulse: 66 58 64 126  Temp:      TempSrc:      Resp: 13 18 12 13   Height:      Weight:      SpO2: 100% 100% 100% 98%    Intake/Output Summary (Last 24 hours) at 03/04/14 1415 Last data filed at 03/04/14 1145  Gross per 24 hour  Intake    974 ml  Output   2301 ml  Net  -1327 ml    Exam:  General: eomi, ncat Cardiovascular:  s1 s2 no m/r/g Respiratory: clear no adde dsound Abdomen: soft, Nt, Nd no rebound Skin nad Neuro intact  Data Reviewed: Basic Metabolic Panel:  Recent Labs Lab 03/02/14 2118 03/03/14 0617  NA 138 141  K 4.0 3.8  CL 101 104  CO2 25  27  GLUCOSE 99 88  BUN 7 6  CREATININE 0.91 0.89  CALCIUM 9.4 9.0   Liver Function Tests:  Recent Labs Lab 03/02/14 2118  AST 20  ALT 12  ALKPHOS 68  BILITOT 0.6  PROT 6.9  ALBUMIN 3.8   No results found for this basename: LIPASE, AMYLASE,  in the last 168 hours No results found for this basename: AMMONIA,  in the last 168 hours CBC:  Recent Labs Lab 03/02/14 2118 03/03/14 0617 03/03/14 1829 03/04/14 0555  WBC 6.7 5.8 5.9 5.8  NEUTROABS 4.0  --   --   --   HGB 12.1* 10.9* 11.7* 12.4*  HCT 37.2* 33.0* 35.3* 37.7*  MCV 92.1 91.4 92.2 91.7  PLT 220 193 196 219   Cardiac Enzymes: No results found for this basename: CKTOTAL, CKMB, CKMBINDEX, TROPONINI,  in the last 168 hours BNP: No components found with this basename: POCBNP,  CBG: No results found for this basename: GLUCAP,  in the last 168 hours  No results found for this or any previous visit (from the past 240 hour(s)).   Studies:              All Imaging reviewed and is as per above notation   Scheduled Meds: . amLODipine  5 mg Oral Daily  . atorvastatin  40 mg Oral Daily  . cholecalciferol  5,000 Units Oral BID  . doxazosin  4 mg Oral QHS  . [START ON 03/06/2014] pantoprazole (PROTONIX) IV  40 mg Intravenous Q12H  . [START ON 03/06/2014] pantoprazole (PROTONIX) IV  40 mg Intravenous Q12H  . pneumococcal 23 valent vaccine  0.5 mL Intramuscular Tomorrow-1000  . cyanocobalamin  100 mcg Oral Daily   Continuous Infusions:    Assessment/Plan: 1. Probable diverticular bleed-no fufhrte rw/u per GI.  montor Hb and if stable in am would d/c with close f/u Dr. Oletta Lamas.  Diet graduated to full.  Monitor Hb [although much improved without any intervention.  PPI changed to PO bid-ASA and plavix on hold and will need re-eval with GI and PCP as to when to start 2. Htn-conntinue Amlodidpine 5 daily 3. Chronic CP-stable 4. CAD--Recent myoview 01/13/14 for CP-Cath showed Single vessel disease-Monitor closely--have asked GI  to clarify when can re-start anti-platelet therapy 5. Prior syncopy-stable currently.  Orthostatics 6. Gerd-conitnue PPI bid as orally 40 mg  Code Status:  Full Family Communication:  Discussed with son bedside Disposition Plan: inpatient   Verneita Griffes, MD  [facility] Pager [phone] 03/04/2014, 2:15 PM    LOS: 2 days

## 2014-03-04 NOTE — Anesthesia Preprocedure Evaluation (Addendum)
Anesthesia Evaluation  Patient identified by MRN, date of birth, ID band Patient awake    Reviewed: Allergy & Precautions, H&P , NPO status , Patient's Chart, lab work & pertinent test results  History of Anesthesia Complications Negative for: history of anesthetic complications  Airway Mallampati: II      Dental  (+) Missing, Chipped, Poor Dentition, Dental Advisory Given   Pulmonary shortness of breath and with exertion, asthma ,          Cardiovascular hypertension, + CAD and + Past MI (cardiac stents)     Neuro/Psych C-spine fusion 2005. Neck stiffness  Neuromuscular disease    GI/Hepatic GERD-  Medicated and Controlled,  Endo/Other    Renal/GU      Musculoskeletal   Abdominal   Peds  Hematology   Anesthesia Other Findings   Reproductive/Obstetrics                         Anesthesia Physical Anesthesia Plan  ASA: III  Anesthesia Plan: MAC   Post-op Pain Management:    Induction: Intravenous  Airway Management Planned: Nasal Cannula  Additional Equipment:   Intra-op Plan:   Post-operative Plan:   Informed Consent: I have reviewed the patients History and Physical, chart, labs and discussed the procedure including the risks, benefits and alternatives for the proposed anesthesia with the patient or authorized representative who has indicated his/her understanding and acceptance.   Dental advisory given  Plan Discussed with: Anesthesiologist  Anesthesia Plan Comments:         Anesthesia Quick Evaluation

## 2014-03-04 NOTE — Evaluation (Signed)
Occupational Therapy Evaluation Patient Details Name: [name] MRN: [id number] DOB: 04-09-1931 Today's Date: 03/04/2014    History of Present Illness [name] is a 80 y.o. Caucasian male with history of hypertension, hyperlipidemia, GERD, coronary artery disease status post stent placement most recent cardiac catheter on 01/13/2014, asthma, chronic low back pain, and status post esophageal dilatation in the past who presents with bloody stool on 5/17.   Clinical Impression   Pt seen for acute OT evaluation for above diagnoses. Pt performed transfers and in-room ambulation at supervision level. Discussed home setup including toilet and shower. Pt has no grab bars by toilet or in shower. Has been using doorknob for toilet transfers at home. Discussed options for creating a safer environment in the bathroom. Pt needs grab bars in toilet and shower or 3n1 to use for both. Educated on energy conservation techniques for safe completion of basic ADLs at home and recommended having chair nearby for ADLs in standing. No further acute OT needs at this time.     Follow Up Recommendations  No OT follow up    Equipment Recommendations  3 in 1 bedside comode;Other (comment) (grab bars for toilet and shower)    Recommendations for Other Services       Precautions / Restrictions Precautions Precautions: Fall Restrictions Weight Bearing Restrictions: No      Mobility Bed Mobility Overal bed mobility: Modified Independent             General bed mobility comments: pt OOB upon arrival  Transfers Overall transfer level: Needs assistance Equipment used: None Transfers: Sit to/from Stand Sit to Stand: Supervision         General transfer comment: pt with safe technique, no episodes of LOB    Balance                                            ADL Overall ADL's : Needs assistance/impaired Eating/Feeding: Set up;Sitting   Grooming: Set  up;Standing;Sitting   Upper Body Bathing: Set up;Sitting;Supervision/ safety   Lower Body Bathing: Supervison/ safety;Sit to/from stand   Upper Body Dressing : Set up;Sitting   Lower Body Dressing: Supervision/safety;Sit to/from Archivist: Supervision/safety (3n1 over toilet)   Toileting- Clothing Manipulation and Hygiene: Sit to/from stand;Min guard   Tub/ Shower Transfer: Min guard;3 in 1;Ambulation   Functional mobility during ADLs: Supervision/safety General ADL Comments: Pt performed transfers and in-room ambulation at supervision level. Discussed home setup and equipment needs. Pt has been usinfg doorknob to steady self during toilet transfers at home. Has no grab bars by toilet or in shower. Discussed options to improve safety of bathroom/shower setup. Educated on energy conservation techniques and safety tips for competion of ADLs at home.     Vision                     Perception     Praxis      Pertinent Vitals/Pain C/o ache in stomach.     Hand Dominance Right   Extremity/Trunk Assessment Upper Extremity Assessment Upper Extremity Assessment: Overall WFL for tasks assessed   Lower Extremity Assessment Lower Extremity Assessment: Defer to PT evaluation   Cervical / Trunk Assessment Cervical / Trunk Assessment: Kyphotic (h/o cervical fusion)   Communication Communication Communication: No difficulties   Cognition Arousal/Alertness: Awake/alert Behavior During Therapy: WFL for tasks assessed/performed  Overall Cognitive Status: Within Functional Limits for tasks assessed                     General Comments       Exercises       Shoulder Instructions      Home Living Family/patient expects to be discharged to:: Private residence Living Arrangements: Spouse/significant other;Children Available Help at Discharge: Family;Available 24 hours/day Type of Home: House Home Access: Stairs to enter [employer] of  Steps: 1   Home Layout: One level     Bathroom Shower/Tub: Occupational psychologist: Standard     Home Equipment: Cane - single point;Hand held shower head   Additional Comments: has no grab bars for toilet or shower has been using doorknob for toilet transfers.       Prior Functioning/Environment Level of Independence: Independent        Comments: uses cane when amb outside    OT Diagnosis:     OT Problem List:     OT Treatment/Interventions:      OT Goals(Current goals can be found in the care plan section) Acute Rehab OT Goals Patient Stated Goal: not stated  OT Frequency:     Barriers to D/C:            Co-evaluation              End of Session Equipment Utilized During Treatment: Gait belt Nurse Communication: Other (comment) (status of endoscopy test)  Activity Tolerance: Patient tolerated treatment well Patient left: in chair;with call bell/phone within reach;with family/visitor present   Time: 9833-8250 OT Time Calculation (min): 20 min Charges:  OT General Charges $OT Visit: 1 Procedure OT Evaluation $Initial OT Evaluation Tier I: 1 Procedure OT Treatments $Self Care/Home Management : 8-22 mins G-Codes:    Hortencia Pilar Mar 10, 2014, 10:55 AM

## 2014-03-04 NOTE — H&P (View-Only) (Signed)
[facility] Gastroenterology Consultation Note  Referring Provider: Dr. Verneita Griffes [facility]) Primary Care Physician:  Geroge Baseman Primary Gastroenterologist:  Dr. Laurence Spates  Reason for Consultation:  Blood in stool  HPI: [name] is a 80 y.o. male admitted for, and whom we've been asked to see on behalf of, blood in stool.  Starting yesterday afternoon, patient with some small bits of black stool, followed thereafter by voluminous bright red hematochezia and blood clots.  Chronic lower abdominal pain for at least 3 years, unclear etiology, evaluation by Dr. Oletta Lamas unrevealing per patient.  Chronic solid-predominant dysphagia, persists despite two prior endoscopic dilatations.  Patient is on clopidigrel chronically for heart disease with stents.  He had endoscopy by Dr. Oletta Lamas in June 2013 showing patent Schatzki's ring, dilated to 26mm, and colonoscopy by Dr. Sharlett Iles in 2011 showed severe left-sided diverticulosis, exam for hematochezia at that time as well.   Past Medical History  Diagnosis Date  . Hypertension   . Hyperlipidemia   . Chronic chest pain   . Dyspnea     chronic  . GERD (gastroesophageal reflux disease)     h/o esophageal spasm  . Asthma   . Back pain   . Coronary artery disease     a. s/p BMS to RCA in 2002; b. s/p cutting balloon POBA ;   c. cath 6/12: oDx 80% (treated with repeat cutting balloon POBA), mLAD 50% with 30-40% at Dx, CFX 30%, pRCA 25% with patent stents;  d.  Lex MV 4/14:  Low Risk - EF 61%, inf scar with peri-infarct ischemia  . Myocardial infarction     Past Surgical History  Procedure Laterality Date  . Cardiac catheterization    . Neck surgery      multiple  . Back surgery      multiple  . Laminectomy    . Posterior laminectomy / decompression cervical spine    . Anterior cervical decomp/discectomy fusion    . Coronary angioplasty with stent placement       previous percutaneous intervention on the  RCA and the diagonal branch  .  Esophagogastroduodenoscopy  03/23/2012    Procedure: ESOPHAGOGASTRODUODENOSCOPY (EGD);  Surgeon: Winfield Cunas., MD;  Location: [facility];  Service: Endoscopy;  Laterality: N/A;  . Savory dilation  03/23/2012    Procedure: SAVORY DILATION;  Surgeon: Winfield Cunas., MD;  Location: [facility];  Service: Endoscopy;  Laterality: N/A;  . Tonsillectomy    . Eye surgery      Prior to Admission medications   Medication Sig Start Date End Date Taking? Authorizing Provider  acetaminophen (TYLENOL) 325 MG tablet Take 2 tablets (650 mg total) by mouth every 4 (four) hours as needed for headache or mild pain. 12/16/13  Yes Luke K Kilroy, PA-C  albuterol (PROVENTIL HFA;VENTOLIN HFA) 108 (90 BASE) MCG/ACT inhaler Inhale 2 puffs into the lungs 2 (two) times daily as needed for wheezing or shortness of breath.   Yes Historical Provider, MD  amLODipine (NORVASC) 5 MG tablet Take 5 mg by mouth daily.   Yes Historical Provider, MD  aspirin EC 81 MG tablet Take 81 mg by mouth daily.   Yes Historical Provider, MD  atorvastatin (LIPITOR) 40 MG tablet Take 40 mg by mouth daily.   Yes Historical Provider, MD  Cholecalciferol (VITAMIN D-3) 5000 UNITS TABS Take 5,000 Units by mouth 2 (two) times daily.   Yes Historical Provider, MD  clopidogrel (PLAVIX) 75 MG tablet Take 1 tablet (75 mg total)  by mouth daily. 01/07/13  Yes Paula V Ross, MD  cyanocobalamin 1000 MCG tablet Take 100 mcg by mouth daily.   Yes Historical Provider, MD  doxazosin (CARDURA) 4 MG tablet Take 4 mg by mouth at bedtime.    Yes Historical Provider, MD  fluocinonide gel (LIDEX) 0.05 % Apply 1 application topically as needed (insect bites).   Yes Historical Provider, MD  fluticasone (FLOVENT HFA) 220 MCG/ACT inhaler Inhale 1 puff into the lungs 2 (two) times daily as needed (shortness of breath).    Yes Historical Provider, MD  furosemide (LASIX) 40 MG tablet Take 1 tablet (40 mg total) by mouth daily as needed for edema. 01/30/14  Yes Scott T  Weaver, PA-C  nitroGLYCERIN (NITROSTAT) 0.4 MG SL tablet Place 0.4 mg under the tongue every 5 (five) minutes as needed for chest pain.   Yes Historical Provider, MD    Current Facility-Administered Medications  Medication Dose Route Frequency Provider Last Rate Last Dose  . acetaminophen (TYLENOL) tablet 650 mg  650 mg Oral Q6H PRN Srikar A Reddy, MD       Or  . acetaminophen (TYLENOL) suppository 650 mg  650 mg Rectal Q6H PRN Srikar A Reddy, MD      . albuterol (PROVENTIL) (2.5 MG/3ML) 0.083% nebulizer solution 2.5 mg  2.5 mg Inhalation BID PRN Srikar A Reddy, MD      . amLODipine (NORVASC) tablet 5 mg  5 mg Oral Daily Srikar A Reddy, MD   5 mg at 03/03/14 1106  . atorvastatin (LIPITOR) tablet 40 mg  40 mg Oral Daily Srikar A Reddy, MD   40 mg at 03/03/14 1106  . cholecalciferol (VITAMIN D) tablet 5,000 Units  5,000 Units Oral BID Srikar A Reddy, MD   5,000 Units at 03/03/14 1100  . doxazosin (CARDURA) tablet 4 mg  4 mg Oral QHS Srikar A Reddy, MD      . fluticasone (FLOVENT HFA) 220 MCG/ACT inhaler 1 puff  1 puff Inhalation BID PRN Srikar A Reddy, MD      . furosemide (LASIX) tablet 40 mg  40 mg Oral Daily PRN Srikar A Reddy, MD      . nitroGLYCERIN (NITROSTAT) SL tablet 0.4 mg  0.4 mg Sublingual Q5 min PRN Srikar A Reddy, MD      . ondansetron (ZOFRAN) tablet 4 mg  4 mg Oral Q6H PRN Srikar A Reddy, MD       Or  . ondansetron (ZOFRAN) injection 4 mg  4 mg Intravenous Q6H PRN Srikar A Reddy, MD      . [START ON 03/06/2014] pantoprazole (PROTONIX) injection 40 mg  40 mg Intravenous Q12H Brian Opitz, MD      . [START ON 03/06/2014] pantoprazole (PROTONIX) injection 40 mg  40 mg Intravenous Q12H Caron George Amend, RPH      . [START ON 03/04/2014] pneumococcal 23 valent vaccine (PNU-IMMUNE) injection 0.5 mL  0.5 mL Intramuscular Tomorrow-1000 Jai-Gurmukh Samtani, MD      . vitamin B-12 (CYANOCOBALAMIN) tablet 100 mcg  100 mcg Oral Daily Srikar A Reddy, MD   100 mcg at 03/03/14 1100    Allergies  as of 03/02/2014 - Review Complete 03/02/2014  Allergen Reaction Noted  . Iohexol Shortness Of Breath and Itching 01/09/2012  . Budesonide-formoterol fumarate Swelling   . Demerol [meperidine] Other (See Comments) 01/18/2013  . Ivp dye [iodinated diagnostic agents]  02/02/2012  . Meperidine hcl Other (See Comments) 10/20/2008  . Naproxen Swelling 11/05/2012  . Simvastatin Other (See   Comments) 10/20/2008  . Pregabalin Rash 11/05/2012  . Sulfonamide derivatives Rash 10/20/2008    Family History  Problem Relation Age of Onset  . Diabetes Father   . Heart disease Father   . Asthma Father   . Heart disease Mother     CABG hx age 52  . Colon cancer Son     hx   . Colitis Son     hx  . Crohn's disease Son     History   Social History  . Marital Status: Married    Spouse Name: N/A    Number of Children: N/A  . Years of Education: N/A   Occupational History  . retired     Clinical research associate   Social History Main Topics  . Smoking status: Never Smoker   . Smokeless tobacco: Not on file  . Alcohol Use: No  . Drug Use: No  . Sexual Activity: No   Other Topics Concern  . Not on file   Social History Narrative   Lives in [place]. Generaly active around the house and walks his dog without chest pain or SOB.    Review of Systems: As per HPI, all others negative  Physical Exam: Vital signs in last 24 hours: Temp:  [98.2 F (36.8 C)-98.9 F (37.2 C)] 98.4 F (36.9 C) (05/18 1307) Pulse Rate:  [54-63] 55 (05/18 1307) Resp:  [12-18] 16 (05/18 1307) BP: (136-160)/(65-89) 160/89 mmHg (05/18 1307) SpO2:  [96 %-99 %] 97 % (05/18 1307) Weight:  [82.555 kg (182 lb)-85.276 kg (188 lb)] 85.276 kg (188 lb) (05/18 0143) Last BM Date: 03/02/14 General:   Alert,  Elderly-appearing but is in no acute distress Head:  Normocephalic and atraumatic. Eyes:  Sclera clear, no icterus.   Conjunctiva pink. Ears:  Normal auditory acuity. Nose:  No deformity, discharge,  or  lesions. Mouth:  No deformity or lesions.  Oropharynx pink & moist. Neck:  Supple; no masses or thyromegaly. Lungs:  Clear throughout to auscultation.   No wheezes, crackles, or rhonchi. No acute distress. Heart:  Regular rate and rhythm; no murmurs, clicks, rubs,  or gallops. Abdomen:  Soft, nondistended. Generalized chronic lower and periumbilical tenderness without peritonitis; No masses, hepatosplenomegaly or hernias noted. Normal bowel sounds, without guarding, and without rebound.     Msk:  Diffusely atrophic, otherwisesSymmetrical without gross deformities. Normal posture. Pulses:  Normal pulses noted. Extremities:  Without clubbing or edema. Neurologic:  Alert and  oriented x4;  Diffusely weak, otherwise grossly normal neurologically. Skin:  Scattered ecchymoses, otherwise intact without significant lesions or rashes. Psych:  Alert and cooperative. Normal mood and affect.   Lab Results:  Recent Labs  03/02/14 2118 03/03/14 0617  WBC 6.7 5.8  HGB 12.1* 10.9*  HCT 37.2* 33.0*  PLT 220 193   BMET  Recent Labs  03/02/14 2118 03/03/14 0617  NA 138 141  K 4.0 3.8  CL 101 104  CO2 25 27  GLUCOSE 99 88  BUN 7 6  CREATININE 0.91 0.89  CALCIUM 9.4 9.0   LFT  Recent Labs  03/02/14 2118  PROT 6.9  ALBUMIN 3.8  AST 20  ALT 12  ALKPHOS 68  BILITOT 0.6   PT/INR  Recent Labs  03/02/14 2118  LABPROT 12.7  INR 0.97    Studies/Results: Dg Abd Acute W/chest  03/03/2014   CLINICAL DATA:  Right-sided abdominal pain, bloody stools.  EXAM: ACUTE ABDOMEN SERIES (ABDOMEN 2 VIEW & CHEST 1 VIEW)  COMPARISON:  DG CHEST 2  VIEW dated 12/14/2013; DG PELVIS 1-2 VIEWS dated 08/02/2012  FINDINGS: Cardiac silhouette is unremarkable, no, similar. Lungs are clear, no pleural effusions. No pneumothorax. Soft tissue planes and included osseous structures are nonsuspicious. ACDF. Moderate degenerative change of the shoulders.  Bowel gas pattern is nondilated and nonobstructive. No  intra-abdominal mass effect, or free air. 12 mm calcification projects in the lower pole left kidney. Mid lumbar laminectomies with solid bony fusion. Phleboliths in the pelvis. Mild aorto iliac atherosclerosis.  IMPRESSION: No acute cardiopulmonary process.  Nonspecific bowel gas pattern.  12 mm probable left nephrolithiasis.   Electronically Signed   By: Elon Alas   On: 03/03/2014 01:05    Impression:  1.  Blood in stool.  Suspect diverticular bleeding.  Black stool, normal BUN level notwithstanding, raises possibility of upper GI tract source, in setting of chronic clopidigrel therapy.  Plan:  1.  Clear liquid diet now. 2.  NPO after midnight. 3.  PPI. 4.  Hold NSAIDs/antiplatelets/anticoagulants as clinically feasible. 5.  Endoscopy tomorrow. 6.  If endoscopy is unrevealing, would manage supportively, but would consider tagged RBC study if patient has rampant rebleeding. 7.  Will follow.   LOS: 1 day   Arta Silence  03/03/2014, 1:41 PM

## 2014-03-04 NOTE — Anesthesia Postprocedure Evaluation (Signed)
  Anesthesia Post-op Note  Patient: [name]  Procedure(s) Performed: Procedure(s): ESOPHAGOGASTRODUODENOSCOPY (EGD) (N/A)  Patient Location: PACU and Endoscopy Unit  Anesthesia Type:MAC  Level of Consciousness: awake  Airway and Oxygen Therapy: Patient Spontanous Breathing  Post-op Pain: mild  Post-op Assessment: Post-op Vital signs reviewed  Post-op Vital Signs: Reviewed  Last Vitals:  Filed Vitals:   03/04/14 1220  BP: 171/85  Pulse: 126  Temp:   Resp: 13    Complications: No apparent anesthesia complications

## 2014-03-04 NOTE — Interval H&P Note (Signed)
History and Physical Interval Note:  03/04/2014 11:21 AM  [name]  has presented today for surgery, with the diagnosis of blood in stool, abdominal pain  The various methods of treatment have been discussed with the patient and family. After consideration of risks, benefits and other options for treatment, the patient has consented to  Procedure(s): ESOPHAGOGASTRODUODENOSCOPY (EGD) (N/A) as a surgical intervention .  The patient's history has been reviewed, patient examined, no change in status, stable for surgery.  I have reviewed the patient's chart and labs.  Questions were answered to the patient's satisfaction.     Arta Silence  Assessment:  1.  Blood in stool. 2.  Intermittent dysphagia.  History of prior esophageal dilatations.  Plan: 1.  Endoscopy with possible treatment of bleeding site, with possible esophageal dilatation. 2.  Risks (bleeding, infection, bowel perforation that could require surgery, sedation-related changes in cardiopulmonary systems), benefits (identification and possible treatment of source of symptoms, exclusion of certain causes of symptoms), and alternatives (watchful waiting, radiographic imaging studies, empiric medical treatment) of upper endoscopy with possible esophageal dilatation (EGD +/- DIL) were explained to patient/family in detail and patient wishes to proceed.

## 2014-03-04 NOTE — Op Note (Signed)
[facility] [place], [id number]   ENDOSCOPY PROCEDURE REPORT  PATIENT: [name], [name].  MR#: [id number] BIRTHDATE: [birth year] , 82  yrs. old GENDER: Male ENDOSCOPIST: Arta Silence, MD REFERRED BY:  [facility] PROCEDURE DATE:  03/04/2014 PROCEDURE:  Esophagogastroduodenoscopy with TTS balloon dilatation of esophagus ASA CLASS:     Class III INDICATIONS:  blood in stool, abdominal pain, dysphagia. MEDICATIONS: MAC sedation, administered by CRNA TOPICAL ANESTHETIC: none  DESCRIPTION OF PROCEDURE: After the risks benefits and alternatives of the procedure were thoroughly explained, informed consent was obtained.  The Pentax Gastroscope [id number] endoscope was introduced through the mouth and advanced to the second portion of the duodenum. Without limitations.  The instrument was slowly withdrawn as the mucosa was fully examined.     Findings:  Small hiatal hernia.  Widely patent Schatzki's ring through which endoscope passed with ease; ring dilated to 16.5 mm with TTS balloon dilating catheter with minimal resistance. Otherwise normal endoscopy to second portion of the duodenum.  No old or fresh blood was identified to the extent of the examination.            The scope was then withdrawn from the patient and the procedure completed.  ENDOSCOPIC IMPRESSION:     As above.  Doubt Schatzki's ring is playing any significant role into patient's dysphagia.  No source of bleeding identified on today's examination; suspect bleeding is diverticular in origin.  RECOMMENDATIONS:     1.  Watch for potential complications of procedure. 2.  Advance diet. 3.  Would hold off on clopidigrel for a couple more days, if clinically appropropriate/feasible. 4.  If patient has further episodes of bleeding (he hasn't had any bleeding in two days), would consider tagged RBC study as next step in management. 5.  Will sign-off; will arrange  outpatient follow-up with Dr. Laurence Spates; please call with questions; thank you for the consultation.  eSigned:  Arta Silence, MD 03/04/2014 11:58 AM   CC:

## 2014-03-04 NOTE — Anesthesia Procedure Notes (Signed)
Procedure Name: MAC Date/Time: 03/04/2014 11:30 AM Performed by: Trixie Deis A Pre-anesthesia Checklist: Patient identified, Patient being monitored, Timeout performed, Emergency Drugs available and Suction available Patient Re-evaluated:Patient Re-evaluated prior to inductionOxygen Delivery Method: Nasal cannula Placement Confirmation: positive ETCO2

## 2014-03-05 ENCOUNTER — Encounter (HOSPITAL_COMMUNITY): Payer: Self-pay | Admitting: Gastroenterology

## 2014-03-05 DIAGNOSIS — R1319 Other dysphagia: Secondary | ICD-10-CM

## 2014-03-05 DIAGNOSIS — E785 Hyperlipidemia, unspecified: Secondary | ICD-10-CM

## 2014-03-05 DIAGNOSIS — R1084 Generalized abdominal pain: Secondary | ICD-10-CM

## 2014-03-05 LAB — CBC
HEMATOCRIT: 36.9 % — AB (ref 39.0–52.0)
Hemoglobin: 12.2 g/dL — ABNORMAL LOW (ref 13.0–17.0)
MCH: 30.6 pg (ref 26.0–34.0)
MCHC: 33.1 g/dL (ref 30.0–36.0)
MCV: 92.5 fL (ref 78.0–100.0)
PLATELETS: 189 10*3/uL (ref 150–400)
RBC: 3.99 MIL/uL — ABNORMAL LOW (ref 4.22–5.81)
RDW: 13.2 % (ref 11.5–15.5)
WBC: 6.8 10*3/uL (ref 4.0–10.5)

## 2014-03-05 LAB — HEMOGLOBIN AND HEMATOCRIT, BLOOD
HCT: 36.2 % — ABNORMAL LOW (ref 39.0–52.0)
Hemoglobin: 12 g/dL — ABNORMAL LOW (ref 13.0–17.0)

## 2014-03-05 MED ORDER — PANTOPRAZOLE SODIUM 40 MG PO TBEC: 40.0000 mg | DELAYED_RELEASE_TABLET | Freq: Two times a day (BID) | ORAL | Status: DC

## 2014-03-05 MED ORDER — CLOPIDOGREL BISULFATE 75 MG PO TABS: 75.0000 mg | ORAL_TABLET | Freq: Every day | ORAL | Status: DC

## 2014-03-05 MED ORDER — ASPIRIN EC 81 MG PO TBEC: 81.0000 mg | DELAYED_RELEASE_TABLET | Freq: Every day | ORAL | Status: DC

## 2014-03-05 NOTE — Discharge Instructions (Signed)
Gastrointestinal Bleeding °Gastrointestinal (GI) bleeding means there is bleeding somewhere along the digestive tract, between the mouth and anus. °CAUSES  °There are many different problems that can cause GI bleeding. Possible causes include: °· Esophagitis. This is inflammation, irritation, or swelling of the esophagus. °· Hemorrhoids. These are veins that are full of blood (engorged) in the rectum. They cause pain, inflammation, and may bleed. °· Anal fissures. These are areas of painful tearing which may bleed. They are often caused by passing hard stool. °· Diverticulosis. These are pouches that form on the colon over time, with age, and may bleed significantly. °· Diverticulitis. This is inflammation in areas with diverticulosis. It can cause pain, fever, and bloody stools, although bleeding is rare. °· Polyps and cancer. Colon cancer often starts out as precancerous polyps. °· Gastritis and ulcers. Bleeding from the upper gastrointestinal tract (near the stomach) may travel through the intestines and produce black, sometimes tarry, often bad smelling stools. In certain cases, if the bleeding is fast enough, the stools may not be black, but red. This condition may be life-threatening. °SYMPTOMS  °· Vomiting bright red blood or material that looks like coffee grounds. °· Bloody, black, or tarry stools. °DIAGNOSIS  °Your caregiver may diagnose your condition by taking your history and performing a physical exam. More tests may be needed, including: °· X-rays and other imaging tests. °· Esophagogastroduodenoscopy (EGD). This test uses a flexible, lighted tube to look at your esophagus, stomach, and small intestine. °· Colonoscopy. This test uses a flexible, lighted tube to look at your colon. °TREATMENT  °Treatment depends on the cause of your bleeding.  °· For bleeding from the esophagus, stomach, small intestine, or colon, the caregiver doing your EGD or colonoscopy may be able to stop the bleeding as part of  the procedure. °· Inflammation or infection of the colon can be treated with medicines. °· Many rectal problems can be treated with creams, suppositories, or warm baths. °· Surgery is sometimes needed. °· Blood transfusions are sometimes needed if you have lost a lot of blood. °If bleeding is slow, you may be allowed to go home. If there is a lot of bleeding, you will need to stay in the hospital for observation. °HOME CARE INSTRUCTIONS  °· Take any medicines exactly as prescribed. °· Keep your stools soft by eating foods that are high in fiber. These foods include whole grains, legumes, fruits, and vegetables. Prunes (1 to 3 a day) work well for many people. °· Drink enough fluids to keep your urine clear or pale yellow. °SEEK IMMEDIATE MEDICAL CARE IF:  °· Your bleeding increases. °· You feel lightheaded, weak, or you faint. °· You have severe cramps in your back or abdomen. °· You pass large blood clots in your stool. °· Your problems are getting worse. °MAKE SURE YOU:  °· Understand these instructions. °· Will watch your condition. °· Will get help right away if you are not doing well or get worse. °Document Released: 09/30/2000 Document Revised: 09/19/2012 Document Reviewed: 09/12/2011 °[employer]® Patient Information ©2014 [employer]. ° °

## 2014-03-05 NOTE — Discharge Summary (Signed)
Physician Discharge Summary  ARMAN LOY XLK:[id number] DOB: [birth year] DOA: 03/02/2014  PCP: Geroge Baseman  Admit date: 03/02/2014 Discharge date: 03/05/2014  Time spent: 45 minutes  Recommendations for Outpatient Follow-up:  Patient will be discharged to home. He is to follow up with his primary care physician within one week of discharge. Patient should also follow up with Dr. Laurence Spates, gastroenterologist. He should continue taking his medications as prescribed.    Discharge Diagnoses:  Principal Problem:   GI bleed Active Problems:   HYPERLIPIDEMIA-MIXED   ESSENTIAL HYPERTENSION   CAD- RCA BMS '02, Dx HSRA June 2012   ASTHMA, UNSPECIFIED, UNSPECIFIED STATUS   GERD   ARTHRITIS   Abdominal pain, generalized   BENIGN PROSTATIC HYPERTROPHY, HX OF   HTN (hypertension)   Anemia   Generalized weakness   Discharge Condition: Stable  Diet recommendation: Soft/ Cardiac healthy  Filed Weights   03/03/14 0143 03/04/14 0505 03/05/14 0447  Weight: 85.276 kg (188 lb) 83.598 kg (184 lb 4.8 oz) 84.414 kg (186 lb 1.6 oz)    History of present illness:  [name] is a 80 y.o. Caucasian male with history of hypertension, hyperlipidemia, GERD, coronary artery disease status post stent placement most recent cardiac catheter on 01/13/2014, asthma, chronic low back pain, and status post esophageal dilatation in the past who presents with the above complaints. Patient reports that his symptoms started initially yesterday when he had poor appetite and didn't eat well. The morning of admission, he woke up and had a bowel movement he had black colored stools and when he wiped he had blood clots. Since his bowel movement he had 2 more episodes where he wiped and had blood clots without a bowel movement. He also had his toilet bowl stained with fresh blood. As a result he presented to the emergency department for further evaluation. He reported feeling weak and dizzy and had to be  helped when he got to the emergency department. He denied any recent fevers, chills, chest pain, shortness of breath, headaches or vision changes. He did complain of initially right sided abdominal pain but then had generalized abdominal pain.  Hospital Course:  GI bleed -Patient was admitted, and started on IV Protonix twice daily. Patient was initially made n.p.o. -His Plavix and aspirin were held -Gastroenterology was consulted -EGD was conducted: Tosti's ring, not playing significant role in the patient's dysphagia. No source of bleeding identified. Suspect bleeding is diverticular in origin. -Protonix switched to oral form. -Patient was started on diet and tolerated this well. -He is no longer having dark or bloody stools -Patient will need to follow up with Dr. Laurence Spates, gastroenterologist at discharge  Abdominal pain -Unclear etiology -X-ray showed benign finding  Anemia -Secondary to GI bleed -Hemoglobin remained stable at 12  Hypertension -Controlled, continue amlodipine and Lasix  Hyperlipidemia -Continue atorvastatin  Coronary artery disease -Aspirin and Plavix currently held due to to GI bleed -Per GI recommendations, patient may restart in a few days. -Currently chest pain-free  Generalized weakness -PT and OT were consulted, and no outpatient followup for supervision needed.  BPH -Continue Cardura  Procedures: EGD Doubt Schatzki's ring is playing any significant role into patient's dysphagia. No source of bleeding identified on today's examination; suspect bleeding is diverticular in origin.  Consultations: Gastroenterology  Discharge Exam: Filed Vitals:   03/05/14 0447  BP: 130/53  Pulse: 66  Temp: 98.3 F (36.8 C)  Resp: 12     General: Well developed, well nourished, NAD, appears  stated age  80: NCAT, PERRLA, EOMI, Anicteic Sclera, mucous membranes moist.   Neck: Supple, no JVD, no masses  Cardiovascular: S1 S2 auscultated, no  rubs, murmurs or gallops. Regular rate and rhythm.  Respiratory: Clear to auscultation bilaterally with equal chest rise  Abdomen: Soft, nontender, nondistended, + bowel sounds  Extremities: warm dry without cyanosis clubbing or edema  Neuro: AAOx3, cranial nerves grossly intact. Strength 5/5 in patient's upper and lower extremities bilaterally  Skin: Without rashes exudates or nodules  Psych: Normal affect and demeanor with intact judgement and insight  Discharge Instructions      Discharge Instructions   Diet - low sodium heart healthy    Complete by:  As directed      Discharge instructions    Complete by:  As directed   Patient will be discharged to home. He is to follow up with his primary care physician within one week of discharge. Patient should also follow up with Dr. Laurence Spates, gastroenterologist. He should continue taking his medications as prescribed.     Increase activity slowly    Complete by:  As directed             Medication List         acetaminophen 325 MG tablet  Commonly known as:  TYLENOL  Take 2 tablets (650 mg total) by mouth every 4 (four) hours as needed for headache or mild pain.     albuterol 108 (90 BASE) MCG/ACT inhaler  Commonly known as:  PROVENTIL HFA;VENTOLIN HFA  Inhale 2 puffs into the lungs 2 (two) times daily as needed for wheezing or shortness of breath.     amLODipine 5 MG tablet  Commonly known as:  NORVASC  Take 5 mg by mouth daily.     aspirin EC 81 MG tablet  Take 1 tablet (81 mg total) by mouth daily.  Start taking on:  03/08/2014     atorvastatin 40 MG tablet  Commonly known as:  LIPITOR  Take 40 mg by mouth daily.     clopidogrel 75 MG tablet  Commonly known as:  PLAVIX  Take 1 tablet (75 mg total) by mouth daily.  Start taking on:  03/08/2014     cyanocobalamin 1000 MCG tablet  Take 100 mcg by mouth daily.     doxazosin 4 MG tablet  Commonly known as:  CARDURA  Take 4 mg by mouth at bedtime.      fluocinonide gel 0.05 %  Commonly known as:  LIDEX  Apply 1 application topically as needed (insect bites).     fluticasone 220 MCG/ACT inhaler  Commonly known as:  FLOVENT HFA  Inhale 1 puff into the lungs 2 (two) times daily as needed (shortness of breath).     furosemide 40 MG tablet  Commonly known as:  LASIX  Take 1 tablet (40 mg total) by mouth daily as needed for edema.     nitroGLYCERIN 0.4 MG SL tablet  Commonly known as:  NITROSTAT  Place 0.4 mg under the tongue every 5 (five) minutes as needed for chest pain.     pantoprazole 40 MG tablet  Commonly known as:  PROTONIX  Take 1 tablet (40 mg total) by mouth 2 (two) times daily.     Vitamin D-3 5000 UNITS Tabs  Take 5,000 Units by mouth 2 (two) times daily.       Allergies  Allergen Reactions  . Iohexol Shortness Of Breath and Itching    Pt was given  100cc of Omnipaque 300 followed by itching/ dyspnea.  . Budesonide-Formoterol Fumarate Swelling    Mouth and tongue swelling.  . Demerol [Meperidine] Other (See Comments)    REACTION: Hallucinations  . Ivp Dye [Iodinated Diagnostic Agents]     Iodine pt had a reaction when he had a CT DONE  . Meperidine Hcl Other (See Comments)    REACTION: Hallucinations  . Naproxen Swelling  . Simvastatin Other (See Comments)    REACTION: unknown  . Pregabalin Rash  . Sulfonamide Derivatives Rash   Follow-up Information   Follow up with CLAGGETT,ELIN, PA-C. Schedule an appointment as soon as possible for a visit in 1 week. [facility] followup)    Specialty:  Family Medicine   Contact information:   [address] [place] [address] [phone]       Schedule an appointment as soon as possible for a visit with EDWARDS Dahlia Client, MD. [facility] followup)    Specialty:  Gastroenterology   Contact information:   [address] [place] [address] [phone]        The results of significant diagnostics from this  hospitalization (including imaging, microbiology, ancillary and laboratory) are listed below for reference.    Significant Diagnostic Studies: Dg Abd Acute W/chest  03/03/2014   CLINICAL DATA:  Right-sided abdominal pain, bloody stools.  EXAM: ACUTE ABDOMEN SERIES (ABDOMEN 2 VIEW & CHEST 1 VIEW)  COMPARISON:  DG CHEST 2 VIEW dated 12/14/2013; DG PELVIS 1-2 VIEWS dated 08/02/2012  FINDINGS: Cardiac silhouette is unremarkable, no, similar. Lungs are clear, no pleural effusions. No pneumothorax. Soft tissue planes and included osseous structures are nonsuspicious. ACDF. Moderate degenerative change of the shoulders.  Bowel gas pattern is nondilated and nonobstructive. No intra-abdominal mass effect, or free air. 12 mm calcification projects in the lower pole left kidney. Mid lumbar laminectomies with solid bony fusion. Phleboliths in the pelvis. Mild aorto iliac atherosclerosis.  IMPRESSION: No acute cardiopulmonary process.  Nonspecific bowel gas pattern.  12 mm probable left nephrolithiasis.   Electronically Signed   By: Elon Alas   On: 03/03/2014 01:05    Microbiology: No results found for this or any previous visit (from the past 240 hour(s)).   Labs: Basic Metabolic Panel:  Recent Labs Lab 03/02/14 2118 03/03/14 0617  NA 138 141  K 4.0 3.8  CL 101 104  CO2 25 27  GLUCOSE 99 88  BUN 7 6  CREATININE 0.91 0.89  CALCIUM 9.4 9.0   Liver Function Tests:  Recent Labs Lab 03/02/14 2118  AST 20  ALT 12  ALKPHOS 68  BILITOT 0.6  PROT 6.9  ALBUMIN 3.8   No results found for this basename: LIPASE, AMYLASE,  in the last 168 hours No results found for this basename: AMMONIA,  in the last 168 hours CBC:  Recent Labs Lab 03/02/14 2118 03/03/14 0617 03/03/14 1829 03/04/14 0555 03/04/14 1830 03/05/14 0544 03/05/14 0815  WBC 6.7 5.8 5.9 5.8 5.8 6.8  --   NEUTROABS 4.0  --   --   --   --   --   --   HGB 12.1* 10.9* 11.7* 12.4* 12.7* 12.2* 12.0*  HCT 37.2* 33.0* 35.3* 37.7*  38.8* 36.9* 36.2*  MCV 92.1 91.4 92.2 91.7 92.2 92.5  --   PLT 220 193 196 219 194 189  --  Cardiac Enzymes: No results found for this basename: CKTOTAL, CKMB, CKMBINDEX, TROPONINI,  in the last 168 hours BNP: BNP (last 3 results)  Recent Labs  12/14/13 1110  PROBNP 729.0*   CBG: No results found for this basename: GLUCAP,  in the last 168 hours     Signed:  Cristal Ford  [facility] 03/05/2014, 1:09 PM

## 2014-03-11 ENCOUNTER — Other Ambulatory Visit: Payer: Self-pay | Admitting: Internal Medicine

## 2014-03-19 ENCOUNTER — Other Ambulatory Visit: Payer: Self-pay | Admitting: Gastroenterology

## 2014-03-19 DIAGNOSIS — R1011 Right upper quadrant pain: Secondary | ICD-10-CM

## 2014-03-26 ENCOUNTER — Ambulatory Visit
Admission: RE | Admit: 2014-03-26 | Discharge: 2014-03-26 | Disposition: A | Discharge status: Home / Self Care | Payer: Medicare Other | Source: Ambulatory Visit | Attending: Gastroenterology | Admitting: Gastroenterology

## 2014-03-26 DIAGNOSIS — R1011 Right upper quadrant pain: Secondary | ICD-10-CM

## 2014-03-27 ENCOUNTER — Encounter (HOSPITAL_COMMUNITY): Payer: Self-pay | Admitting: Pharmacy Technician

## 2014-03-31 ENCOUNTER — Telehealth: Payer: Self-pay | Admitting: Internal Medicine

## 2014-03-31 NOTE — Telephone Encounter (Signed)
Received request from Nurse fax box, documents faxed for surgical clearance. To: [facility]  Fax number: [phone] Attention: 6.12.15/rf

## 2014-04-07 ENCOUNTER — Telehealth: Payer: Self-pay | Admitting: Internal Medicine

## 2014-04-07 NOTE — Telephone Encounter (Signed)
New message     Faxed clearance for colonoscopy next thurs last week.  Calling to check status on clearance

## 2014-04-08 ENCOUNTER — Encounter (HOSPITAL_COMMUNITY): Payer: Self-pay | Admitting: *Deleted

## 2014-04-08 ENCOUNTER — Telehealth: Payer: Self-pay | Admitting: Internal Medicine

## 2014-04-08 NOTE — Telephone Encounter (Signed)
Paperwork placed in medical records for faxing 

## 2014-04-08 NOTE — Telephone Encounter (Signed)
Received request from Nurse fax box, documents faxed for surgical clearance. To: [facility]  Fax number: [phone] Attention: 6.23.15/km

## 2014-04-08 NOTE — Telephone Encounter (Signed)
Clearance is on cart for reviewed by dr Harrington Challenger

## 2014-04-10 ENCOUNTER — Telehealth: Payer: Self-pay | Admitting: Internal Medicine

## 2014-04-10 NOTE — Telephone Encounter (Signed)
New problem   Need to know status of medical clearance for pt to stop Plavax . Pt having procedure 04/17/14.

## 2014-04-11 NOTE — Telephone Encounter (Signed)
Follow up      Office calling back regarding cardiac clearance.  Surgery on  7/2.

## 2014-04-12 ENCOUNTER — Other Ambulatory Visit: Payer: Self-pay | Admitting: Internal Medicine

## 2014-04-14 ENCOUNTER — Other Ambulatory Visit: Payer: Self-pay | Admitting: Gastroenterology

## 2014-04-14 NOTE — Addendum Note (Signed)
Addended by: Vernie Ammons. on: 04/14/2014 03:11 PM   Modules accepted: Orders

## 2014-04-15 NOTE — OR Nursing (Signed)
Spoke with patient's son, [name], to change the appt time from 8:30 to 10:00 on 04/17/14.  Both parties agreeable.

## 2014-04-15 NOTE — Telephone Encounter (Signed)
Called office x2 to verify they received faxed cardiac clearance, was disconnected x2 after holding on line and operator answered.

## 2014-04-17 ENCOUNTER — Encounter (HOSPITAL_COMMUNITY): Payer: Self-pay | Admitting: *Deleted

## 2014-04-17 ENCOUNTER — Ambulatory Visit (HOSPITAL_COMMUNITY)
Admission: RE | Admit: 2014-04-17 | Discharge: 2014-04-17 | Disposition: A | Discharge status: Home / Self Care | Payer: Medicare Other | Source: Ambulatory Visit | Attending: Gastroenterology | Admitting: Gastroenterology

## 2014-04-17 ENCOUNTER — Encounter (HOSPITAL_COMMUNITY)
Admission: RE | Disposition: A | Discharge status: Home / Self Care | Payer: Self-pay | Source: Ambulatory Visit | Attending: Gastroenterology

## 2014-04-17 ENCOUNTER — Encounter (HOSPITAL_COMMUNITY): Payer: Medicare Other | Admitting: Anesthesiology

## 2014-04-17 ENCOUNTER — Ambulatory Visit (HOSPITAL_COMMUNITY): Payer: Medicare Other | Admitting: Anesthesiology

## 2014-04-17 DIAGNOSIS — K589 Irritable bowel syndrome without diarrhea: Secondary | ICD-10-CM | POA: Insufficient documentation

## 2014-04-17 DIAGNOSIS — I2510 Atherosclerotic heart disease of native coronary artery without angina pectoris: Secondary | ICD-10-CM | POA: Insufficient documentation

## 2014-04-17 DIAGNOSIS — I252 Old myocardial infarction: Secondary | ICD-10-CM | POA: Insufficient documentation

## 2014-04-17 DIAGNOSIS — K219 Gastro-esophageal reflux disease without esophagitis: Secondary | ICD-10-CM | POA: Insufficient documentation

## 2014-04-17 DIAGNOSIS — K5730 Diverticulosis of large intestine without perforation or abscess without bleeding: Secondary | ICD-10-CM | POA: Insufficient documentation

## 2014-04-17 DIAGNOSIS — I10 Essential (primary) hypertension: Secondary | ICD-10-CM | POA: Insufficient documentation

## 2014-04-17 DIAGNOSIS — K625 Hemorrhage of anus and rectum: Secondary | ICD-10-CM | POA: Insufficient documentation

## 2014-04-17 DIAGNOSIS — E785 Hyperlipidemia, unspecified: Secondary | ICD-10-CM | POA: Insufficient documentation

## 2014-04-17 DIAGNOSIS — Z860100 Personal history of colon polyps, unspecified: Secondary | ICD-10-CM | POA: Insufficient documentation

## 2014-04-17 DIAGNOSIS — J45909 Unspecified asthma, uncomplicated: Secondary | ICD-10-CM | POA: Insufficient documentation

## 2014-04-17 DIAGNOSIS — Z85820 Personal history of malignant melanoma of skin: Secondary | ICD-10-CM | POA: Insufficient documentation

## 2014-04-17 DIAGNOSIS — Z882 Allergy status to sulfonamides status: Secondary | ICD-10-CM | POA: Insufficient documentation

## 2014-04-17 DIAGNOSIS — Z7982 Long term (current) use of aspirin: Secondary | ICD-10-CM | POA: Insufficient documentation

## 2014-04-17 DIAGNOSIS — K648 Other hemorrhoids: Secondary | ICD-10-CM | POA: Insufficient documentation

## 2014-04-17 DIAGNOSIS — Z79899 Other long term (current) drug therapy: Secondary | ICD-10-CM | POA: Insufficient documentation

## 2014-04-17 DIAGNOSIS — Z7902 Long term (current) use of antithrombotics/antiplatelets: Secondary | ICD-10-CM | POA: Insufficient documentation

## 2014-04-17 DIAGNOSIS — Z9861 Coronary angioplasty status: Secondary | ICD-10-CM | POA: Insufficient documentation

## 2014-04-17 HISTORY — PX: COLONOSCOPY WITH PROPOFOL: SHX5780

## 2014-04-17 SURGERY — COLONOSCOPY WITH PROPOFOL
Anesthesia: Monitor Anesthesia Care

## 2014-04-17 MED ORDER — PROPOFOL 10 MG/ML IV BOLUS
INTRAVENOUS | Status: DC | PRN
  Administered 2014-04-17: 20 mg via INTRAVENOUS

## 2014-04-17 MED ORDER — SODIUM CHLORIDE 0.9 % IV SOLN: INTRAVENOUS | Status: DC

## 2014-04-17 MED ORDER — PROMETHAZINE HCL 25 MG/ML IJ SOLN: 6.2500 mg | INTRAMUSCULAR | Status: DC | PRN

## 2014-04-17 MED ORDER — PROPOFOL INFUSION 10 MG/ML OPTIME
INTRAVENOUS | Status: DC | PRN
  Administered 2014-04-17: 100 ug/kg/min via INTRAVENOUS

## 2014-04-17 MED ORDER — PROPOFOL 10 MG/ML IV BOLUS
INTRAVENOUS | Status: AC
  Filled 2014-04-17: qty 20

## 2014-04-17 MED ORDER — LIDOCAINE HCL (CARDIAC) 20 MG/ML IV SOLN
INTRAVENOUS | Status: DC | PRN
  Administered 2014-04-17: 50 mg via INTRAVENOUS

## 2014-04-17 MED ORDER — LIDOCAINE HCL (CARDIAC) 20 MG/ML IV SOLN
INTRAVENOUS | Status: AC
  Filled 2014-04-17: qty 5

## 2014-04-17 MED ORDER — LACTATED RINGERS IV SOLN
INTRAVENOUS | Status: DC
  Administered 2014-04-17: 11:00:00 via INTRAVENOUS

## 2014-04-17 SURGICAL SUPPLY — 22 items

## 2014-04-17 NOTE — Op Note (Signed)
[facility] [place], [address]   COLONOSCOPY PROCEDURE REPORT  PATIENT: [name], [name].  MR#: [id number] BIRTHDATE: [birth year] , 82  yrs. old GENDER: Male ENDOSCOPIST: Laurence Spates, MD REFERRED BY:   Dr Dorris Carnes PROCEDURE DATE:  04/17/2014 PROCEDURE: ASA CLASS:   Class 3 INDICATIONS:     GI Bleeding MEDICATIONS:     MAC, propofol 160 mg  DESCRIPTION OF PROCEDURE:   After the risks benefits and alternatives of the procedure were thoroughly explained, informed consent was obtained.       The Pentax Ped Colon [id number]  endoscope was introduced through the anus and advanced to the   . No adverse events experienced.   The quality of the prep was good.  The instrument was then slowly withdrawn as the colon was fully examined. No polyllps masses bleeding. Few diverticuli in sigmoid large IH on retroflex view.    The time to cecum=  .  Withdrawal time=  .  The scope was withdrawn and the procedure completed. COMPLICATIONS: There were no complications.  ENDOSCOPIC IMPRESSION: 1. Rectal Bleeding probably due to hemorrhoids or diverticuli 2. Diverticulosis 3. Internal Hemorrhoids RECOMMENDATIONS:  Same meds F/U with Dr Oletta Lamas in 1 month  eSigned:  Laurence Spates, MD 04/17/2014 11:29 AM   cc:       CC: Dr Lizbeth Bark

## 2014-04-17 NOTE — H&P (Signed)
Subjective:   Patient is a 80 y.o. male presents with recent rectal bleeding. He has had some dysphagia with prior esophageal dilatation's. Colonoscopy 2013 by myself on for recent diarrhea did not show significant diverticular disease with normal clotting biopsies. He was hospitalized recently for G.I. bleeding did not require transfusions. Stool was bright red with clots and resolve spontaneously. The source was unclear and for that reason colonoscopy is to be repeated.. Procedure including risks and benefits discussed in office.  Patient Active Problem List   Diagnosis Date Noted  . GI bleed 03/03/2014  . Anemia 03/03/2014  . Generalized weakness 03/03/2014  . HTN (hypertension) 12/20/2013  . Chest pain, mid sternal 01/18/2013  . Chest pain with moderate risk of acute coronary syndrome 04/16/2012  . ESOPHAGEAL STRICTURE 09/20/2010  . ORTHOSTATIC DIZZINESS 08/30/2010  . IBS 08/26/2010  . RECTAL BLEEDING 08/26/2010  . RECTAL PAIN 08/26/2010  . ARTHRITIS 08/26/2010  . LOSS OF APPETITE 08/26/2010  . NAUSEA AND VOMITING 08/26/2010  . OTHER DYSPHAGIA 08/26/2010  . Abdominal pain, generalized 08/26/2010  . COLONIC POLYPS, HX OF 08/26/2010  . ANAL FISSURE, HX OF 08/26/2010  . PALPITATIONS, HX OF 08/18/2010  . PERIPHERAL NEUROPATHY 05/01/2009  . MYOCARDIAL INFARCTION 05/01/2009  . ALLERGIC RHINITIS, SEASONAL 05/01/2009  . DEPRESSION, HX OF 05/01/2009  . Personal history of other diseases of digestive disease 05/01/2009  . NEPHROLITHIASIS, HX OF 05/01/2009  . BENIGN PROSTATIC HYPERTROPHY, HX OF 05/01/2009  . LAMINECTOMY, HX OF 05/01/2009  . HYPERLIPIDEMIA-MIXED 10/20/2008  . ESSENTIAL HYPERTENSION 10/20/2008  . CAD- RCA BMS '02, Dx HSRA June 2012 10/20/2008  . ASTHMA, UNSPECIFIED, UNSPECIFIED STATUS 10/20/2008  . GERD 10/20/2008  . DYSPNEA 10/20/2008   Past Medical History  Diagnosis Date  . Hypertension   . Hyperlipidemia   . Chronic chest pain   . Dyspnea     chronic  .  GERD (gastroesophageal reflux disease)     h/o esophageal spasm  . Asthma   . Back pain   . Coronary artery disease     a. s/p BMS to RCA in 2002; b. s/p cutting balloon POBA ;   c. cath 6/12: oDx 80% (treated with repeat cutting balloon POBA), mLAD 50% with 30-40% at Dx, CFX 30%, pRCA 25% with patent stents;  d.  Lex MV 4/14:  Low Risk - EF 61%, inf scar with peri-infarct ischemia  . Myocardial infarction 2001    x 1, confirned 1 possible  . Cancer late 1990's    melanoma    Past Surgical History  Procedure Laterality Date  . Cardiac catheterization    . Neck surgery      multiple  . Back surgery      multiple  . Laminectomy    . Posterior laminectomy / decompression cervical spine    . Anterior cervical decomp/discectomy fusion    . Esophagogastroduodenoscopy  03/23/2012    Procedure: ESOPHAGOGASTRODUODENOSCOPY (EGD);  Surgeon: Winfield Cunas., MD;  Location: [facility];  Service: Endoscopy;  Laterality: N/A;  . Savory dilation  03/23/2012    Procedure: SAVORY DILATION;  Surgeon: Winfield Cunas., MD;  Location: [facility];  Service: Endoscopy;  Laterality: N/A;  . Esophagogastroduodenoscopy N/A 03/04/2014    Procedure: ESOPHAGOGASTRODUODENOSCOPY (EGD);  Surgeon: Arta Silence, MD;  Location: [facility] ENDOSCOPY;  Service: Endoscopy;  Laterality: N/A;  . Coronary angioplasty with stent placement  x 2 stents     previous percutaneous intervention on the  RCA and the diagonal branch  . Eye  surgery Bilateral yrs ago    Prescriptions prior to admission  Medication Sig Dispense Refill  . acetaminophen (TYLENOL) 325 MG tablet Take 2 tablets (650 mg total) by mouth every 4 (four) hours as needed for headache or mild pain.      Marland Kitchen albuterol (PROVENTIL HFA;VENTOLIN HFA) 108 (90 BASE) MCG/ACT inhaler Inhale 2 puffs into the lungs 2 (two) times daily as needed for wheezing or shortness of breath.      Marland Kitchen amLODipine (NORVASC) 5 MG tablet Take 5 mg by mouth every morning.       Marland Kitchen aspirin EC 81  MG tablet Take 1 tablet (81 mg total) by mouth daily.      Marland Kitchen doxazosin (CARDURA) 4 MG tablet Take 4 mg by mouth at bedtime.       . fluticasone (FLOVENT HFA) 220 MCG/ACT inhaler Inhale 1 puff into the lungs 2 (two) times daily as needed (shortness of breath).       . pantoprazole (PROTONIX) 40 MG tablet Take 1 tablet (40 mg total) by mouth 2 (two) times daily.  60 tablet  0  . atorvastatin (LIPITOR) 40 MG tablet Take 40 mg by mouth daily.      . Cholecalciferol (VITAMIN D-3) 5000 UNITS TABS Take 5,000 Units by mouth 2 (two) times daily.      . clopidogrel (PLAVIX) 75 MG tablet Take 1 tablet (75 mg total) by mouth daily.  90 tablet  0  . cyanocobalamin 1000 MCG tablet Take 100 mcg by mouth daily.      . fluocinonide gel (LIDEX) 9.79 % Apply 1 application topically as needed (insect bites).      . furosemide (LASIX) 40 MG tablet Take 1 tablet (40 mg total) by mouth daily as needed for edema.  30 tablet  5  . nitroGLYCERIN (NITROSTAT) 0.4 MG SL tablet Place 0.4 mg under the tongue every 5 (five) minutes as needed for chest pain.       Allergies  Allergen Reactions  . Iohexol Shortness Of Breath and Itching    Pt was given 100cc of Omnipaque 300 followed by itching/ dyspnea.  . Budesonide-Formoterol Fumarate Swelling    Mouth and tongue swelling.  . Demerol [Meperidine] Other (See Comments)    REACTION: Hallucinations  . Ivp Dye [Iodinated Diagnostic Agents]     Iodine pt had a reaction when he had a CT DONE  . Meperidine Hcl Other (See Comments)    REACTION: Hallucinations  . Naproxen Swelling  . Simvastatin Other (See Comments)    REACTION: unknown  . Pregabalin Rash  . Sulfonamide Derivatives Rash    History  Substance Use Topics  . Smoking status: Never Smoker   . Smokeless tobacco: Never Used  . Alcohol Use: No    Family History  Problem Relation Age of Onset  . Diabetes Father   . Heart disease Father   . Asthma Father   . Heart disease Mother     CABG hx age 53  . Colon  cancer Son     hx   . Colitis Son     hx  . Crohn's disease Son      Objective:   Patient Vitals for the past 8 hrs:  BP Temp Temp src Pulse Resp SpO2 Height Weight  04/17/14 1001 157/80 mmHg 98.6 F (37 C) Oral 82 17 98 % 5\' 9"  (1.753 m) 80.287 kg (177 lb)         See MD Preop evaluation  Assessment:   1. Hematochezia. This resolved in etiology is unclear colonoscopy a couple years ago did not show significant diverticular disease with colonic biopsies can terminal ileum biopsies normal. Procedure done to evaluate for some other source. 2. History of IBS. 3. CAD. History of heart attacks on chronic Plavix, history of cardiac stents 4. Esophageal stricture secondary to GERD. History of prior dilatation's  Plan:   Patient for colonoscopy today to evaluate for cause of this recent episode rectal bleeding.

## 2014-04-17 NOTE — Transfer of Care (Signed)
Immediate Anesthesia Transfer of Care Note  Patient: [name]  Procedure(s) Performed: Procedure(s): COLONOSCOPY WITH PROPOFOL (N/A)  Patient Location: PACU  Anesthesia Type:MAC  Level of Consciousness: awake, alert  and oriented  Airway & Oxygen Therapy: Patient Spontanous Breathing and Patient connected to face mask oxygen  Post-op Assessment:   Post vital signs: Reviewed and stable  Complications: No apparent anesthesia complications

## 2014-04-17 NOTE — Discharge Instructions (Addendum)
You should repeat the colonoscopy in the next few years.  Our office will notify you by mail when it is time.  Please call at [phone] for any problems

## 2014-04-17 NOTE — Anesthesia Preprocedure Evaluation (Signed)
Anesthesia Evaluation  Patient identified by MRN, date of birth, ID band Patient awake    Reviewed: Allergy & Precautions, H&P , NPO status , Patient's Chart, lab work & pertinent test results  History of Anesthesia Complications Negative for: history of anesthetic complications  Airway Mallampati: II      Dental  (+) Missing, Chipped, Poor Dentition, Dental Advisory Given   Pulmonary shortness of breath and with exertion, asthma ,          Cardiovascular hypertension, + CAD and + Past MI (cardiac stents)     Neuro/Psych C-spine fusion 2005. Neck stiffness  Neuromuscular disease    GI/Hepatic GERD-  Medicated and Controlled,  Endo/Other    Renal/GU      Musculoskeletal   Abdominal   Peds  Hematology   Anesthesia Other Findings   Reproductive/Obstetrics                           Anesthesia Physical  Anesthesia Plan  ASA: III  Anesthesia Plan: MAC   Post-op Pain Management:    Induction:   Airway Management Planned: Simple Face Mask  Additional Equipment:   Intra-op Plan:   Post-operative Plan:   Informed Consent: I have reviewed the patients History and Physical, chart, labs and discussed the procedure including the risks, benefits and alternatives for the proposed anesthesia with the patient or authorized representative who has indicated his/her understanding and acceptance.   Dental advisory given  Plan Discussed with: Anesthesiologist  Anesthesia Plan Comments:         Anesthesia Quick Evaluation

## 2014-04-18 ENCOUNTER — Encounter (HOSPITAL_COMMUNITY): Payer: Self-pay | Admitting: Gastroenterology

## 2014-04-22 ENCOUNTER — Other Ambulatory Visit: Payer: Self-pay | Admitting: *Deleted

## 2014-04-22 MED ORDER — ATORVASTATIN CALCIUM 40 MG PO TABS: 40.0000 mg | ORAL_TABLET | Freq: Every day | ORAL | Status: DC

## 2014-04-22 MED ORDER — METOPROLOL TARTRATE 25 MG PO TABS: 25.0000 mg | ORAL_TABLET | Freq: Two times a day (BID) | ORAL | Status: DC

## 2014-04-22 MED ORDER — POTASSIUM CHLORIDE ER 10 MEQ PO TBCR: 10.0000 meq | EXTENDED_RELEASE_TABLET | Freq: Every day | ORAL | Status: DC

## 2014-04-22 MED ORDER — AMLODIPINE BESYLATE 5 MG PO TABS: 5.0000 mg | ORAL_TABLET | Freq: Every morning | ORAL | Status: DC

## 2014-04-22 MED ORDER — ISOSORBIDE MONONITRATE ER 60 MG PO TB24: 60.0000 mg | ORAL_TABLET | Freq: Every day | ORAL | Status: DC

## 2014-04-24 ENCOUNTER — Other Ambulatory Visit: Payer: Self-pay

## 2014-04-24 MED ORDER — PANTOPRAZOLE SODIUM 40 MG PO TBEC: 40.0000 mg | DELAYED_RELEASE_TABLET | Freq: Two times a day (BID) | ORAL | Status: DC

## 2014-04-27 NOTE — Anesthesia Postprocedure Evaluation (Signed)
  Anesthesia Post-op Note  Patient: [name]  Procedure(s) Performed: Procedure(s) (LRB): COLONOSCOPY WITH PROPOFOL (N/A)  Patient Location: PACU  Anesthesia Type: MAC  Level of Consciousness: awake and alert   Airway and Oxygen Therapy: Patient Spontanous Breathing  Post-op Pain: mild  Post-op Assessment: Post-op Vital signs reviewed, Patient's Cardiovascular Status Stable, Respiratory Function Stable, Patent Airway and No signs of Nausea or vomiting  Last Vitals:  Filed Vitals:   04/17/14 1156  BP: 185/88  Pulse: 66  Temp:   Resp: 14    Post-op Vital Signs: stable   Complications: No apparent anesthesia complications

## 2014-05-01 ENCOUNTER — Other Ambulatory Visit: Payer: Self-pay | Admitting: *Deleted

## 2014-05-01 MED ORDER — ATORVASTATIN CALCIUM 40 MG PO TABS: 40.0000 mg | ORAL_TABLET | Freq: Every day | ORAL | Status: DC

## 2014-05-01 NOTE — Telephone Encounter (Signed)
RX REFILL FOR LIPITOR 40 MG # 43 X 3 TO [facility] (SILVERSCRIPT)

## 2014-06-06 ENCOUNTER — Ambulatory Visit: Payer: Medicare Other | Admitting: Internal Medicine

## 2014-06-25 ENCOUNTER — Encounter: Payer: Self-pay | Admitting: Gastroenterology

## 2014-06-27 ENCOUNTER — Ambulatory Visit: Payer: Medicare Other | Admitting: Internal Medicine

## 2014-07-03 NOTE — Progress Notes (Signed)
ID   Patient is an 80 yo with CAD   His last cath was in 2012 when he underwent cutting balloon angioplasty [name]) of diagonal (repeat intervention)  Mild disease elsewhere.     Myoview since  showed no ischemia I last saw the patient in March.  Seen by [name] in April   Hospitalized in May for GI bleed  ASA and plavix held.   Patient denies CP  Breathing gets short with activity  Seems to be worse. Did have BRB in toilet.    Current Outpatient Prescriptions on File Prior to Visit  Medication Sig Dispense Refill  . acetaminophen (TYLENOL) 325 MG tablet Take 2 tablets (650 mg total) by mouth every 4 (four) hours as needed for headache or mild pain.      Marland Kitchen albuterol (PROVENTIL HFA;VENTOLIN HFA) 108 (90 BASE) MCG/ACT inhaler Inhale 2 puffs into the lungs 2 (two) times daily as needed for wheezing or shortness of breath.      Marland Kitchen amLODipine (NORVASC) 5 MG tablet Take 1 tablet (5 mg total) by mouth every morning.  30 tablet  1  . aspirin EC 81 MG tablet Take 1 tablet (81 mg total) by mouth daily.      Marland Kitchen atorvastatin (LIPITOR) 40 MG tablet Take 1 tablet (40 mg total) by mouth daily.  90 tablet  3  . Cholecalciferol (VITAMIN D-3) 5000 UNITS TABS Take 5,000 Units by mouth 2 (two) times daily.      . clopidogrel (PLAVIX) 75 MG tablet Take 1 tablet (75 mg total) by mouth daily.  90 tablet  0  . cyanocobalamin 1000 MCG tablet Take 100 mcg by mouth daily.      Marland Kitchen doxazosin (CARDURA) 4 MG tablet Take 4 mg by mouth at bedtime.       . fluocinonide gel (LIDEX) 5.36 % Apply 1 application topically as needed (insect bites).      . fluticasone (FLOVENT HFA) 220 MCG/ACT inhaler Inhale 1 puff into the lungs 2 (two) times daily as needed (shortness of breath).       . furosemide (LASIX) 40 MG tablet Take 1 tablet (40 mg total) by mouth daily as needed for edema.  30 tablet  5  . isosorbide mononitrate (IMDUR) 60 MG 24 hr tablet Take 1 tablet (60 mg total) by mouth daily.  30 tablet  1  . metoprolol tartrate  (LOPRESSOR) 25 MG tablet Take 1 tablet (25 mg total) by mouth 2 (two) times daily.  60 tablet  1  . nitroGLYCERIN (NITROSTAT) 0.4 MG SL tablet Place 0.4 mg under the tongue every 5 (five) minutes as needed for chest pain.      . pantoprazole (PROTONIX) 40 MG tablet Take 1 tablet (40 mg total) by mouth 2 (two) times daily.  60 tablet  2  . potassium chloride (K-DUR) 10 MEQ tablet Take 1 tablet (10 mEq total) by mouth daily.  30 tablet  1   No current facility-administered medications on file prior to visit.    Allergies  Allergen Reactions  . Iohexol Shortness Of Breath and Itching    Pt was given 100cc of Omnipaque 300 followed by itching/ dyspnea.  . Budesonide-Formoterol Fumarate Swelling    Mouth and tongue swelling.  . Demerol [Meperidine] Other (See Comments)    REACTION: Hallucinations  . Ivp Dye [Iodinated Diagnostic Agents]     Iodine pt had a reaction when he had a CT DONE  . Meperidine Hcl Other (See Comments)  REACTION: Hallucinations  . Naproxen Swelling  . Simvastatin Other (See Comments)    REACTION: unknown  . Pregabalin Rash  . Sulfonamide Derivatives Rash    Past Medical History  Diagnosis Date  . Hypertension   . Hyperlipidemia   . Chronic chest pain   . Dyspnea     chronic  . GERD (gastroesophageal reflux disease)     h/o esophageal spasm  . Asthma   . Back pain   . Coronary artery disease     a. s/p BMS to RCA in 2002; b. s/p cutting balloon POBA ;   c. cath 6/12: oDx 80% (treated with repeat cutting balloon POBA), mLAD 50% with 30-40% at Dx, CFX 30%, pRCA 25% with patent stents;  d.  Lex MV 4/14:  Low Risk - EF 61%, inf scar with peri-infarct ischemia  . Myocardial infarction 2001    x 1, confirned 1 possible  . Cancer late 1990's    melanoma    Past Surgical History  Procedure Laterality Date  . Cardiac catheterization    . Neck surgery      multiple  . Back surgery      multiple  . Laminectomy    . Posterior laminectomy / decompression  cervical spine    . Anterior cervical decomp/discectomy fusion    . Esophagogastroduodenoscopy  03/23/2012    Procedure: ESOPHAGOGASTRODUODENOSCOPY (EGD);  Surgeon: Winfield Cunas., MD;  Location: [facility];  Service: Endoscopy;  Laterality: N/A;  . Savory dilation  03/23/2012    Procedure: SAVORY DILATION;  Surgeon: Winfield Cunas., MD;  Location: [facility];  Service: Endoscopy;  Laterality: N/A;  . Esophagogastroduodenoscopy N/A 03/04/2014    Procedure: ESOPHAGOGASTRODUODENOSCOPY (EGD);  Surgeon: Arta Silence, MD;  Location: [facility] ENDOSCOPY;  Service: Endoscopy;  Laterality: N/A;  . Coronary angioplasty with stent placement  x 2 stents     previous percutaneous intervention on the  RCA and the diagonal branch  . Eye surgery Bilateral yrs ago  . Colonoscopy with propofol N/A 04/17/2014    Procedure: COLONOSCOPY WITH PROPOFOL;  Surgeon: Winfield Cunas., MD;  Location: [facility] ENDOSCOPY;  Service: Endoscopy;  Laterality: N/A;    History  Smoking status  . Never Smoker   Smokeless tobacco  . Never Used    History  Alcohol Use No    Family History  Problem Relation Age of Onset  . Diabetes Father   . Heart disease Father   . Asthma Father   . Heart disease Mother     CABG hx age 2  . Colon cancer Son     hx   . Colitis Son     hx  . Crohn's disease Son     Review of Systems: The review of systems is per the HPI.  All other systems were reviewed and are negative.  Physical Exam: BP 158/62  Pulse 55  Ht 5\' 9"  (1.753 m)  Wt 175 lb (79.379 kg)  BMI 25.83 kg/m2 Patient is very pleasant and in no acute distress. Skin is warm and dry. Color is normal.  HEENT is unremarkable except for very poor dentition. Normocephalic/atraumatic. PERRL. Sclera are nonicteric. Neck is supple. No masses. No JVD. Lungs are clear. Cardiac exam shows a regular rate and rhythm. Abdomen is soft. Extremities are without edema. Gait and ROM are intact. No gross neurologic deficits  noted.  LABORATORY DATA: n/a  Lab Results  Component Value Date   WBC 5.5 07/04/2014   HGB 12.6*  07/04/2014   HCT 38.4* 07/04/2014   PLT 179.0 07/04/2014   GLUCOSE 103* 07/04/2014   CHOL 172 07/04/2014   TRIG 57.0 07/04/2014   HDL 49.20 07/04/2014   LDLCALC 111* 07/04/2014   ALT 12 03/02/2014   AST 18 07/04/2014   NA 139 07/04/2014   K 4.0 07/04/2014   CL 103 07/04/2014   CREATININE 1.1 07/04/2014   BUN 10 07/04/2014   CO2 28 07/04/2014   TSH 0.80 07/04/2014   PSA 5.73* 01/07/2013   INR 0.97 03/02/2014   Myoview Impression  Exercise Capacity: Lexiscan with no exercise.  BP Response: Normal blood pressure response.  Clinical Symptoms: Chest pain.  ECG Impression: No significant ST segment change suggestive of ischemia.  Comparison with Prior Nuclear Study: Prior study showed no significant defect.   Overall Impression: Low risk stress nuclear study. There was a medium-sized, mild basal to mid inferior perfusion defect that was partially reversible. This suggests prior inferior infarction with peri-infarct ischemia.   LV Ejection Fraction: 61%. LV Wall Motion: NL LV Function; NL Wall Motion  Loralie Champagne  01/31/2013  CARDIAC CATH FROM June 2012 FINAL ASSESSMENT:  1. Severe ostial diagonal stenosis with successful cutting balloon  angioplasty.  2. Continued patency of the right coronary artery stents.  3. Nonobstructive left anterior descending and left circumflex  stenosis.   EKG  SB 55 bpm,  PVC   Assessment / Plan: 1. CAD - Patient denies CP  Does have SOB  ? Angina equiv.  WIll check CBC with bleeding.  3. HLD  Keep on current regimen  4.  HTN  BP is a little high.  5.GI  Check CBC.

## 2014-07-04 ENCOUNTER — Ambulatory Visit (INDEPENDENT_AMBULATORY_CARE_PROVIDER_SITE_OTHER): Payer: Medicare Other | Admitting: Internal Medicine

## 2014-07-04 VITALS — BP 158/62 | HR 55 | Ht 69.0 in | Wt 175.0 lb

## 2014-07-04 DIAGNOSIS — I2510 Atherosclerotic heart disease of native coronary artery without angina pectoris: Secondary | ICD-10-CM

## 2014-07-04 DIAGNOSIS — E785 Hyperlipidemia, unspecified: Secondary | ICD-10-CM

## 2014-07-04 DIAGNOSIS — I10 Essential (primary) hypertension: Secondary | ICD-10-CM

## 2014-07-04 LAB — CBC
HEMATOCRIT: 38.4 % — AB (ref 39.0–52.0)
Hemoglobin: 12.6 g/dL — ABNORMAL LOW (ref 13.0–17.0)
MCHC: 32.9 g/dL (ref 30.0–36.0)
MCV: 92.2 fl (ref 78.0–100.0)
Platelets: 179 10*3/uL (ref 150.0–400.0)
RBC: 4.17 Mil/uL — ABNORMAL LOW (ref 4.22–5.81)
RDW: 15.8 % — ABNORMAL HIGH (ref 11.5–15.5)
WBC: 5.5 10*3/uL (ref 4.0–10.5)

## 2014-07-04 LAB — AST: AST: 18 U/L (ref 0–37)

## 2014-07-04 LAB — BASIC METABOLIC PANEL
BUN: 10 mg/dL (ref 6–23)
CO2: 28 mEq/L (ref 19–32)
Calcium: 9.4 mg/dL (ref 8.4–10.5)
Chloride: 103 mEq/L (ref 96–112)
Creatinine, Ser: 1.1 mg/dL (ref 0.4–1.5)
GFR: 71.24 mL/min (ref >60.00)
Glucose, Bld: 103 mg/dL — ABNORMAL HIGH (ref 70–99)
POTASSIUM: 4 meq/L (ref 3.5–5.1)
SODIUM: 139 meq/L (ref 135–145)

## 2014-07-04 LAB — LIPID PANEL
CHOL/HDL RATIO: 3
Cholesterol: 172 mg/dL (ref 0–200)
HDL: 49.2 mg/dL (ref >39.00)
LDL Cholesterol: 111 mg/dL — ABNORMAL HIGH (ref 0–99)
NonHDL: 122.8
TRIGLYCERIDES: 57 mg/dL (ref 0.0–149.0)
VLDL: 11.4 mg/dL (ref 0.0–40.0)

## 2014-07-04 LAB — TSH: TSH: 0.8 u[IU]/mL (ref 0.35–4.50)

## 2014-07-04 MED ORDER — NYSTATIN 100000 UNIT/ML MT SUSP: 5.0000 mL | Freq: Four times a day (QID) | OROMUCOSAL | Status: DC

## 2014-07-04 NOTE — Patient Instructions (Signed)
Your physician has recommended you make the following change in your medication:  1.) begin nystatin--swish and spit---5 ml four times per day  Your physician recommends that you return for lab work today (AST, LIPIDS, CBC, BMET, TSH)  Your physician wants you to follow-up in: [place].  You will receive a reminder letter in the mail two months in advance. If you don't receive a letter, please call our office to schedule the follow-up appointment.

## 2014-07-07 ENCOUNTER — Other Ambulatory Visit: Payer: Self-pay | Admitting: *Deleted

## 2014-07-07 DIAGNOSIS — E785 Hyperlipidemia, unspecified: Secondary | ICD-10-CM

## 2014-07-07 MED ORDER — EZETIMIBE 10 MG PO TABS: 10.0000 mg | ORAL_TABLET | Freq: Every day | ORAL | Status: DC

## 2014-07-08 ENCOUNTER — Other Ambulatory Visit: Payer: Self-pay | Admitting: Internal Medicine

## 2014-07-09 ENCOUNTER — Other Ambulatory Visit: Payer: Self-pay | Admitting: Internal Medicine

## 2014-07-09 NOTE — Telephone Encounter (Signed)
See note from pharmacy regarding patient wanting larger quantity. Thanks, MI

## 2014-07-11 ENCOUNTER — Telehealth: Payer: Self-pay | Admitting: *Deleted

## 2014-07-11 MED ORDER — NYSTATIN 100000 UNIT/ML MT SUSP: 5.0000 mL | Freq: Four times a day (QID) | OROMUCOSAL | Status: DC

## 2014-07-11 NOTE — Telephone Encounter (Signed)
Patients son called requesting a refill of nystatin. If there are any questions he stated that you can call the patient to discuss. Please advise. Thanks, MI

## 2014-07-11 NOTE — Telephone Encounter (Signed)
Pt had nystatin 60 ml. Finished the bottle.  It has been helping his mouth. Requests refill.

## 2014-07-18 ENCOUNTER — Other Ambulatory Visit: Payer: Self-pay | Admitting: Internal Medicine

## 2014-08-18 ENCOUNTER — Other Ambulatory Visit: Payer: Self-pay | Admitting: Internal Medicine

## 2014-09-03 ENCOUNTER — Other Ambulatory Visit (INDEPENDENT_AMBULATORY_CARE_PROVIDER_SITE_OTHER): Payer: Medicare Other | Admitting: *Deleted

## 2014-09-03 DIAGNOSIS — E785 Hyperlipidemia, unspecified: Secondary | ICD-10-CM

## 2014-09-03 LAB — LIPID PANEL
Cholesterol: 123 mg/dL (ref 0–200)
HDL: 49.6 mg/dL (ref >39.00)
LDL Cholesterol: 54 mg/dL (ref 0–99)
NonHDL: 73.4
TRIGLYCERIDES: 95 mg/dL (ref 0.0–149.0)
Total CHOL/HDL Ratio: 2
VLDL: 19 mg/dL (ref 0.0–40.0)

## 2014-09-03 LAB — AST: AST: 23 U/L (ref 0–37)

## 2014-09-10 ENCOUNTER — Other Ambulatory Visit: Payer: Self-pay | Admitting: Internal Medicine

## 2014-09-16 ENCOUNTER — Other Ambulatory Visit: Payer: Self-pay | Admitting: Internal Medicine

## 2014-09-25 ENCOUNTER — Encounter (HOSPITAL_COMMUNITY): Payer: Self-pay | Admitting: Cardiovascular Disease

## 2014-10-16 ENCOUNTER — Other Ambulatory Visit: Payer: Self-pay | Admitting: Internal Medicine

## 2014-12-15 ENCOUNTER — Other Ambulatory Visit: Payer: Self-pay | Admitting: Internal Medicine

## 2015-01-19 ENCOUNTER — Encounter: Payer: Self-pay | Admitting: Internal Medicine

## 2015-01-19 ENCOUNTER — Other Ambulatory Visit: Payer: Medicare Other

## 2015-01-19 ENCOUNTER — Ambulatory Visit (INDEPENDENT_AMBULATORY_CARE_PROVIDER_SITE_OTHER): Payer: Medicare Other | Admitting: Internal Medicine

## 2015-01-19 VITALS — BP 130/74 | HR 70 | Ht 69.0 in | Wt 179.1 lb

## 2015-01-19 DIAGNOSIS — E785 Hyperlipidemia, unspecified: Secondary | ICD-10-CM | POA: Diagnosis not present

## 2015-01-19 DIAGNOSIS — R0602 Shortness of breath: Secondary | ICD-10-CM | POA: Diagnosis not present

## 2015-01-19 LAB — CBC
HCT: 38 % — ABNORMAL LOW (ref 39.0–52.0)
Hemoglobin: 12.7 g/dL — ABNORMAL LOW (ref 13.0–17.0)
MCHC: 33.5 g/dL (ref 30.0–36.0)
MCV: 89.6 fl (ref 78.0–100.0)
Platelets: 193 10*3/uL (ref 150.0–400.0)
RBC: 4.24 Mil/uL (ref 4.22–5.81)
RDW: 13.6 % (ref 11.5–15.5)
WBC: 5.5 10*3/uL (ref 4.0–10.5)

## 2015-01-19 LAB — LIPID PANEL
CHOL/HDL RATIO: 3
Cholesterol: 123 mg/dL (ref 0–200)
HDL: 45.6 mg/dL (ref >39.00)
LDL CALC: 63 mg/dL (ref 0–99)
NonHDL: 77.4
Triglycerides: 71 mg/dL (ref 0.0–149.0)
VLDL: 14.2 mg/dL (ref 0.0–40.0)

## 2015-01-19 LAB — BASIC METABOLIC PANEL
BUN: 9 mg/dL (ref 6–23)
CO2: 30 meq/L (ref 19–32)
CREATININE: 0.92 mg/dL (ref 0.40–1.50)
Calcium: 9.4 mg/dL (ref 8.4–10.5)
Chloride: 103 mEq/L (ref 96–112)
GFR: 83.78 mL/min (ref >60.00)
Glucose, Bld: 97 mg/dL (ref 70–99)
Potassium: 3.9 mEq/L (ref 3.5–5.1)
Sodium: 137 mEq/L (ref 135–145)

## 2015-01-19 LAB — AST: AST: 19 U/L (ref 0–37)

## 2015-01-19 LAB — D-DIMER, QUANTITATIVE (NOT AT ARMC): D-Dimer, Quant: 0.95 ug/mL-FEU — ABNORMAL HIGH (ref 0.00–0.48)

## 2015-01-19 MED ORDER — DOXAZOSIN MESYLATE 2 MG PO TABS: 2.0000 mg | ORAL_TABLET | Freq: Every day | ORAL | Status: DC

## 2015-01-19 NOTE — Progress Notes (Signed)
Cardiology Office Note   Date:  01/19/2015   ID:  [name], [name] [name] 03, 1932, MRN [id number]  PCP:  Geroge Baseman  Cardiologist:  Dorris Carnes, MD   Chief Complaint  Patient presents with  . Coronary Artery Disease      History of Present Illness: [name] is a 81 y.o. male who has a history of  CAD His last cath was in 2012 when he underwent cutting balloon angioplasty [name]) of diagonal (repeat intervention) Mild disease elsewhere.   Myoview since showed no ischemia I saw him in the fall He continues to complain of SOB  Gives out  Has not gotten better  No signif CP Also complains of dizziness  No syncope    Past Medical History  Diagnosis Date  . Hypertension   . Hyperlipidemia   . Chronic chest pain   . Dyspnea     chronic  . GERD (gastroesophageal reflux disease)     h/o esophageal spasm  . Asthma   . Back pain   . Coronary artery disease     a. s/p BMS to RCA in 2002; b. s/p cutting balloon POBA ;   c. cath 6/12: oDx 80% (treated with repeat cutting balloon POBA), mLAD 50% with 30-40% at Dx, CFX 30%, pRCA 25% with patent stents;  d.  Lex MV 4/14:  Low Risk - EF 61%, inf scar with peri-infarct ischemia  . Myocardial infarction 2001    x 1, confirned 1 possible  . Cancer late 1990's    melanoma    Past Surgical History  Procedure Laterality Date  . Cardiac catheterization    . Neck surgery      multiple  . Back surgery      multiple  . Laminectomy    . Posterior laminectomy / decompression cervical spine    . Anterior cervical decomp/discectomy fusion    . Esophagogastroduodenoscopy  03/23/2012    Procedure: ESOPHAGOGASTRODUODENOSCOPY (EGD);  Surgeon: Winfield Cunas., MD;  Location: [facility];  Service: Endoscopy;  Laterality: N/A;  . Savory dilation  03/23/2012    Procedure: SAVORY DILATION;  Surgeon: Winfield Cunas., MD;  Location: [facility];  Service: Endoscopy;  Laterality: N/A;  . Esophagogastroduodenoscopy N/A  03/04/2014    Procedure: ESOPHAGOGASTRODUODENOSCOPY (EGD);  Surgeon: Arta Silence, MD;  Location: [facility] ENDOSCOPY;  Service: Endoscopy;  Laterality: N/A;  . Coronary angioplasty with stent placement  x 2 stents     previous percutaneous intervention on the  RCA and the diagonal branch  . Eye surgery Bilateral yrs ago  . Colonoscopy with propofol N/A 04/17/2014    Procedure: COLONOSCOPY WITH PROPOFOL;  Surgeon: Winfield Cunas., MD;  Location: [facility] ENDOSCOPY;  Service: Endoscopy;  Laterality: N/A;  . Left heart catheterization with coronary angiogram N/A 01/13/2014    Procedure: LEFT HEART CATHETERIZATION WITH CORONARY ANGIOGRAM;  Surgeon: Blane Ohara, MD;  Location: [name] CATH LAB;  Service: Cardiovascular;  Laterality: N/A;     Current Outpatient Prescriptions  Medication Sig Dispense Refill  . acetaminophen (TYLENOL) 325 MG tablet Take 2 tablets (650 mg total) by mouth every 4 (four) hours as needed for headache or mild pain.    Marland Kitchen albuterol (PROVENTIL HFA;VENTOLIN HFA) 108 (90 BASE) MCG/ACT inhaler Inhale 2 puffs into the lungs 2 (two) times daily as needed for wheezing or shortness of breath.    Marland Kitchen amLODipine (NORVASC) 5 MG tablet TAKE 1 TABLET BY MOUTH IN THE MORNING. 90 tablet  1  . aspirin EC 81 MG tablet Take 1 tablet (81 mg total) by mouth daily.    Marland Kitchen atorvastatin (LIPITOR) 40 MG tablet TAKE 1 TABLET BY MOUTH AT BEDTIME FOR CHOLESTEROL 30 tablet 3  . Cholecalciferol (VITAMIN D-3) 5000 UNITS TABS Take 5,000 Units by mouth 2 (two) times daily.    . clopidogrel (PLAVIX) 75 MG tablet TAKE 1 TABLET BY MOUTH ONCE DAILY. 30 tablet 5  . cyanocobalamin 1000 MCG tablet Take 100 mcg by mouth daily.    . cyclobenzaprine (FLEXERIL) 5 MG tablet Take 5 mg by mouth 3 (three) times daily as needed for muscle spasms (Patient taking by mouth one and half tablets three times daily).    Marland Kitchen doxazosin (CARDURA) 4 MG tablet TAKE ONE TABLET BY MOUTH AT BEDTIME. 90 tablet 1  . fluocinonide gel (LIDEX) 9.83 % Apply  1 application topically as needed (insect bites).    . fluticasone (FLOVENT HFA) 220 MCG/ACT inhaler Inhale 1 puff into the lungs 2 (two) times daily as needed (shortness of breath).     . furosemide (LASIX) 40 MG tablet Take 1 tablet (40 mg total) by mouth daily as needed for edema. 30 tablet 5  . HYDROcodone-acetaminophen (NORCO) 7.5-325 MG per tablet Take 1 tablet by mouth every 4 (four) hours as needed for moderate pain. FOR PAIN    . isosorbide mononitrate (IMDUR) 60 MG 24 hr tablet TAKE 1 TABLET BY MOUTH ONCE DAILY. 30 tablet 5  . metoprolol tartrate (LOPRESSOR) 25 MG tablet TAKE 1 TABLET BY MOUTH TWICE DAILY 180 tablet 1  . nitroGLYCERIN (NITROSTAT) 0.4 MG SL tablet Place 0.4 mg under the tongue every 5 (five) minutes as needed for chest pain.    . nortriptyline (PAMELOR) 25 MG capsule Take 25 mg by mouth daily.     Marland Kitchen nystatin (MYCOSTATIN) 100000 UNIT/ML suspension Take 5 mLs (500,000 Units total) by mouth 4 (four) times daily. 120 mL 0  . pantoprazole (PROTONIX) 40 MG tablet TAKE 1 TABLET BY MOUTH TWICE DAILY FOR REFLUX. 60 tablet 5  . potassium chloride (K-DUR) 10 MEQ tablet Take 1 tablet (10 mEq total) by mouth daily. 30 tablet 1  . tamsulosin (FLOMAX) 0.4 MG CAPS capsule Take 0.4 mg by mouth daily.     Marland Kitchen ZETIA 10 MG tablet TAKE (1/2) TABLET BY MOUTH ONCE DAILY. 45 tablet 2   No current facility-administered medications for this visit.    Allergies:   Budesonide-formoterol fumarate; Iohexol; Simvastatin; Demerol; Ivp dye; Meperidine hcl; Naproxen; Pregabalin; and Sulfonamide derivatives    Social History:  The patient  reports that he has never smoked. He has never used smokeless tobacco. He reports that he does not drink alcohol or use illicit drugs.  Family History:  The patient's family history includes Asthma in his father; Colitis in his son; Colon cancer in his son; Crohn's disease in his son; Diabetes in his father; Heart disease in his father and mother.    ROS:  Please see  the history of present illness.      All other systems are reviewed and negative.    PHYSICAL EXAM: VS:  BP 130/74 mmHg  Pulse 70  Ht 5\' 9"  (1.753 m)  Wt 179 lb 1.9 oz (81.248 kg)  BMI 26.44 kg/m2  SpO2 97% , BMI Body mass index is 26.44 kg/(m^2).  With walking no signif drop in Oxygen GEN: Well nourished, well developed, in no acute distress HEENT: normal Neck: no JVD, carotid bruits, or masses Cardiac: RRR;  no murmurs, rubs, or gallops,no edema  Respiratory:  clear to auscultation bilaterally, normal work of breathing GI: soft, nontender, nondistended, + BS MS: no deformity or atrophy Skin: warm and dry, no rash Neuro:  Strength and sensation are intact Psych: euthymic mood, full affect   EKG:  EKG not done   Recent Labs: 03/02/2014: ALT 12 07/04/2014: BUN 10; Creatinine 1.1; Hemoglobin 12.6*; Platelets 179.0; Potassium 4.0; Sodium 139; TSH 0.80    Lipid Panel    Component Value Date/Time   CHOL 123 09/03/2014 0906   TRIG 95.0 09/03/2014 0906   HDL 49.60 09/03/2014 0906   CHOLHDL 2 09/03/2014 0906   VLDL 19.0 09/03/2014 0906   LDLCALC 54 09/03/2014 0906      Wt Readings from Last 3 Encounters:  01/19/15 179 lb 1.9 oz (81.248 kg)  07/04/14 175 lb (79.379 kg)  04/17/14 177 lb (80.287 kg)      ASSESSMENT AND PLAN:  1.  Dyspnea  Concerning  Diffiluct to assess  Known CAD   Last cath in March 2015 done weith FFR  No flow limiting lesion.s   WOuld recomm repeat myoview to assess for ischemia.  2  Dizziness  Patinet BP drops, HR increases with standing  Symtpomatic  I would recomm holding/stopping amlodipine and cutting cardura to 2 mg    3  HL  Keep on lipitor/zetia     Signed, Dorris Carnes, MD  01/19/2015 11:54 AM    [facility] [place], [place], [place]  [address] Phone: [phone]; Fax: [phone]

## 2015-01-19 NOTE — Patient Instructions (Addendum)
Your physician has recommended you make the following change in your medication:  1.) STOP AMLODIPINE 2.) DECREASE CARDURA TO 2 MG DAILY  Your physician recommends that you return for lab work today. (cbc, bmet, lipid, ast, ddimer)  Your physician has requested that you have a lexiscan myoview. For further information please visit [email or web address]. Please follow instruction sheet, as given.  TRY TO INCREASE YOUR FLUID INTAKE DAILY TO HELP WITH YOUR BLOOD PRESSURE.

## 2015-01-28 ENCOUNTER — Encounter (HOSPITAL_COMMUNITY): Payer: Medicare Other

## 2015-01-28 ENCOUNTER — Ambulatory Visit (HOSPITAL_COMMUNITY): Payer: Medicare Other | Attending: Internal Medicine | Admitting: Radiology

## 2015-01-28 DIAGNOSIS — R0602 Shortness of breath: Secondary | ICD-10-CM | POA: Insufficient documentation

## 2015-01-28 MED ORDER — REGADENOSON 0.4 MG/5ML IV SOLN
0.4000 mg | Freq: Once | INTRAVENOUS | Status: AC
  Administered 2015-01-28: 0.4 mg via INTRAVENOUS

## 2015-01-28 MED ORDER — TECHNETIUM TC 99M SESTAMIBI GENERIC - CARDIOLITE
11.0000 | Freq: Once | INTRAVENOUS | Status: AC | PRN
  Administered 2015-01-28: 11 via INTRAVENOUS

## 2015-01-28 MED ORDER — TECHNETIUM TC 99M SESTAMIBI GENERIC - CARDIOLITE
33.0000 | Freq: Once | INTRAVENOUS | Status: AC | PRN
  Administered 2015-01-28: 33 via INTRAVENOUS

## 2015-01-28 NOTE — Progress Notes (Signed)
[name] [address] [place], [place] [address] [phone]    Cardiology Nuclear Med Study  [name] is a 81 y.o. male     MRN : [id number]     DOB: 1930/10/20  Procedure Date: 01/28/2015  Nuclear Med Background Indication for Stress Test:  Evaluation for Ischemia History:  Asthma and CAD-Stents, MPI-EF:61%,  Cardiac Risk Factors: Hypertension  Symptoms:  DOE, Palpitations and SOB   Nuclear Pre-Procedure Caffeine/Decaff Intake:  None NPO After: 4 pm   Lungs:  clear O2 Sat: 98% on room air. IV 0.9% NS with Angio Cath:  22g  IV Site: R Hand  IV Started by:  Crissie Figures, RN  Chest Size (in):  42 Cup Size: n/a  Height: 5\' 9"  (1.753 m)  Weight:  178 lb (80.74 kg)  BMI:  Body mass index is 26.27 kg/(m^2). Tech Comments:  N/A    Nuclear Med Study 1 or 2 day study: 1 day  Stress Test Type:  Lexiscan  Reading MD: N/A  Order Authorizing Provider:  Dorris Carnes, MD  Resting Radionuclide: Technetium 84m Sestamibi  Resting Radionuclide Dose: 11.0 mCi   Stress Radionuclide:  Technetium 63m Sestamibi  Stress Radionuclide Dose: 33.0 mCi           Stress Protocol Rest HR: 55 Stress HR: 83  Rest BP: 155/83 Stress BP: 135/71  Exercise Time (min): n/a METS: n/a   Predicted Max HR: 139 bpm % Max HR: 59.71 bpm Rate Pressure Product: 12865   Dose of Adenosine (mg):  n/a Dose of Lexiscan: 0.4 mg  Dose of Atropine (mg): n/a Dose of Dobutamine: n/a mcg/kg/min (at max HR)  Stress Test Technologist: Perrin Maltese, EMT-P  Nuclear Technologist:  Earl Many, CNMT     Rest Procedure:  Myocardial perfusion imaging was performed at rest 45 minutes following the intravenous administration of Technetium 67m Sestamibi. Rest ECG: NSR - Normal EKG  Stress Procedure:  The patient received IV Lexiscan 0.4 mg over 15-seconds.  Technetium 67m Sestamibi injected at 30-seconds. This patient was sob, felt strange, and had abdominal tightness with the  Lexiscan injection. Quantitative spect images were obtained after a 45 minute delay. Stress ECG: No significant change from baseline ECG  QPS Raw Data Images:  Patient motion noted. Stress Images:  There is decreased uptake in the inferior wall. Rest Images:  There is decreased uptake in the inferior wall. Subtraction (SDS):  Possible infarct Transient Ischemic Dilatation (Normal <1.22):  1.00 Lung/Heart Ratio (Normal <0.45):  0.33  Quantitative Gated Spect Images QGS EDV:  110 ml QGS ESV:  55 ml  Impression Exercise Capacity:  Lexiscan with no exercise. BP Response:  Normal blood pressure response. Clinical Symptoms:  There is dyspnea. ECG Impression:  No significant ST segment change suggestive of ischemia. Comparison with Prior Nuclear Study: No images to compare  Overall Impression:  Low risk stress nuclear study Possible small inferobasal wall infarct no ischemia.  Specificity decreased by motion artifact.  LV Ejection Fraction: 50%.  LV Wall Motion:  Normal Wall Motion or Mildly decreased EF 50% no discrete RWMA   [name]

## 2015-03-13 ENCOUNTER — Other Ambulatory Visit: Payer: Self-pay | Admitting: Internal Medicine

## 2015-04-14 ENCOUNTER — Other Ambulatory Visit: Payer: Self-pay | Admitting: Internal Medicine

## 2015-04-14 ENCOUNTER — Other Ambulatory Visit: Payer: Self-pay | Admitting: Cardiology

## 2015-05-28 ENCOUNTER — Other Ambulatory Visit: Payer: Self-pay | Admitting: Internal Medicine

## 2015-06-17 ENCOUNTER — Encounter (HOSPITAL_COMMUNITY): Payer: Self-pay | Admitting: Emergency Medicine

## 2015-06-17 ENCOUNTER — Emergency Department (HOSPITAL_COMMUNITY): Payer: Medicare Other

## 2015-06-17 ENCOUNTER — Observation Stay (HOSPITAL_COMMUNITY)
Admission: EM | Admit: 2015-06-17 | Discharge: 2015-06-18 | Disposition: A | Discharge status: Home / Self Care | Payer: Medicare Other | Attending: Internal Medicine | Admitting: Internal Medicine

## 2015-06-17 DIAGNOSIS — K219 Gastro-esophageal reflux disease without esophagitis: Secondary | ICD-10-CM | POA: Insufficient documentation

## 2015-06-17 DIAGNOSIS — I10 Essential (primary) hypertension: Secondary | ICD-10-CM | POA: Diagnosis not present

## 2015-06-17 DIAGNOSIS — J45909 Unspecified asthma, uncomplicated: Secondary | ICD-10-CM | POA: Insufficient documentation

## 2015-06-17 DIAGNOSIS — Z79899 Other long term (current) drug therapy: Secondary | ICD-10-CM | POA: Diagnosis not present

## 2015-06-17 DIAGNOSIS — R51 Headache: Secondary | ICD-10-CM | POA: Insufficient documentation

## 2015-06-17 DIAGNOSIS — I2510 Atherosclerotic heart disease of native coronary artery without angina pectoris: Secondary | ICD-10-CM | POA: Diagnosis not present

## 2015-06-17 DIAGNOSIS — E785 Hyperlipidemia, unspecified: Secondary | ICD-10-CM | POA: Insufficient documentation

## 2015-06-17 DIAGNOSIS — Z7982 Long term (current) use of aspirin: Secondary | ICD-10-CM | POA: Insufficient documentation

## 2015-06-17 DIAGNOSIS — Z85820 Personal history of malignant melanoma of skin: Secondary | ICD-10-CM | POA: Diagnosis not present

## 2015-06-17 DIAGNOSIS — Z7902 Long term (current) use of antithrombotics/antiplatelets: Secondary | ICD-10-CM | POA: Diagnosis not present

## 2015-06-17 DIAGNOSIS — G8929 Other chronic pain: Secondary | ICD-10-CM | POA: Diagnosis not present

## 2015-06-17 DIAGNOSIS — M1990 Unspecified osteoarthritis, unspecified site: Secondary | ICD-10-CM | POA: Insufficient documentation

## 2015-06-17 DIAGNOSIS — I160 Hypertensive urgency: Secondary | ICD-10-CM

## 2015-06-17 DIAGNOSIS — R410 Disorientation, unspecified: Secondary | ICD-10-CM | POA: Insufficient documentation

## 2015-06-17 DIAGNOSIS — I25119 Atherosclerotic heart disease of native coronary artery with unspecified angina pectoris: Secondary | ICD-10-CM | POA: Insufficient documentation

## 2015-06-17 DIAGNOSIS — I252 Old myocardial infarction: Secondary | ICD-10-CM | POA: Diagnosis not present

## 2015-06-17 DIAGNOSIS — N320 Bladder-neck obstruction: Secondary | ICD-10-CM | POA: Insufficient documentation

## 2015-06-17 HISTORY — DX: Malignant melanoma of other part of trunk: C43.59

## 2015-06-17 HISTORY — DX: Personal history of other diseases of the digestive system: Z87.19

## 2015-06-17 HISTORY — DX: Low back pain: M54.5

## 2015-06-17 HISTORY — DX: Other chronic pain: G89.29

## 2015-06-17 HISTORY — DX: Unspecified osteoarthritis, unspecified site: M19.90

## 2015-06-17 HISTORY — DX: Headache, unspecified: R51.9

## 2015-06-17 HISTORY — DX: Personal history of other medical treatment: Z92.89

## 2015-06-17 HISTORY — DX: Low back pain, unspecified: M54.50

## 2015-06-17 HISTORY — DX: Headache: R51

## 2015-06-17 LAB — CBC WITH DIFFERENTIAL/PLATELET
BASOS ABS: 0 10*3/uL (ref 0.0–0.1)
Basophils Relative: 1 % (ref 0–1)
Eosinophils Absolute: 0.1 10*3/uL (ref 0.0–0.7)
Eosinophils Relative: 2 % (ref 0–5)
HEMATOCRIT: 38.5 % — AB (ref 39.0–52.0)
Hemoglobin: 12.8 g/dL — ABNORMAL LOW (ref 13.0–17.0)
Lymphocytes Relative: 26 % (ref 12–46)
Lymphs Abs: 1.5 10*3/uL (ref 0.7–4.0)
MCH: 30 pg (ref 26.0–34.0)
MCHC: 33.2 g/dL (ref 30.0–36.0)
MCV: 90.2 fL (ref 78.0–100.0)
Monocytes Absolute: 0.6 10*3/uL (ref 0.1–1.0)
Monocytes Relative: 9 % (ref 3–12)
NEUTROS PCT: 62 % (ref 43–77)
Neutro Abs: 3.6 10*3/uL (ref 1.7–7.7)
Platelets: 218 10*3/uL (ref 150–400)
RBC: 4.27 MIL/uL (ref 4.22–5.81)
RDW: 13.6 % (ref 11.5–15.5)
WBC: 5.9 10*3/uL (ref 4.0–10.5)

## 2015-06-17 LAB — URINALYSIS, ROUTINE W REFLEX MICROSCOPIC
Bilirubin Urine: NEGATIVE
GLUCOSE, UA: NEGATIVE mg/dL
Ketones, ur: NEGATIVE mg/dL
LEUKOCYTES UA: NEGATIVE
Nitrite: NEGATIVE
PH: 5.5 (ref 5.0–8.0)
PROTEIN: NEGATIVE mg/dL
SPECIFIC GRAVITY, URINE: 1.023 (ref 1.005–1.030)
Urobilinogen, UA: 1 mg/dL (ref 0.0–1.0)

## 2015-06-17 LAB — RAPID URINE DRUG SCREEN, HOSP PERFORMED
AMPHETAMINES: NOT DETECTED
BENZODIAZEPINES: NOT DETECTED
Barbiturates: NOT DETECTED
Cocaine: NOT DETECTED
Opiates: POSITIVE — AB
TETRAHYDROCANNABINOL: NOT DETECTED

## 2015-06-17 LAB — COMPREHENSIVE METABOLIC PANEL
ALBUMIN: 3.5 g/dL (ref 3.5–5.0)
ALK PHOS: 66 U/L (ref 38–126)
ALT: 13 U/L — AB (ref 17–63)
AST: 21 U/L (ref 15–41)
Anion gap: 4 — ABNORMAL LOW (ref 5–15)
BUN: 7 mg/dL (ref 6–20)
CALCIUM: 9.1 mg/dL (ref 8.9–10.3)
CHLORIDE: 104 mmol/L (ref 101–111)
CO2: 31 mmol/L (ref 22–32)
CREATININE: 0.86 mg/dL (ref 0.61–1.24)
GFR calc non Af Amer: >60 mL/min (ref >60)
GLUCOSE: 98 mg/dL (ref 65–99)
Potassium: 3.5 mmol/L (ref 3.5–5.1)
SODIUM: 139 mmol/L (ref 135–145)
Total Bilirubin: 1.2 mg/dL (ref 0.3–1.2)
Total Protein: 5.8 g/dL — ABNORMAL LOW (ref 6.5–8.1)

## 2015-06-17 LAB — URINE MICROSCOPIC-ADD ON

## 2015-06-17 LAB — ETHANOL: Alcohol, Ethyl (B): <5 mg/dL (ref <5)

## 2015-06-17 LAB — PROTIME-INR
INR: 1.05 (ref 0.00–1.49)
PROTHROMBIN TIME: 13.9 s (ref 11.6–15.2)

## 2015-06-17 LAB — I-STAT TROPONIN, ED: Troponin i, poc: 0.01 ng/mL (ref 0.00–0.08)

## 2015-06-17 MED ORDER — ISOSORBIDE MONONITRATE ER 60 MG PO TB24
60.0000 mg | ORAL_TABLET | Freq: Every day | ORAL | Status: DC
  Administered 2015-06-17 – 2015-06-18 (×2): 60 mg via ORAL
  Filled 2015-06-17 (×2): qty 1

## 2015-06-17 MED ORDER — PANTOPRAZOLE SODIUM 40 MG PO TBEC
40.0000 mg | DELAYED_RELEASE_TABLET | Freq: Every day | ORAL | Status: DC
  Administered 2015-06-17 – 2015-06-18 (×2): 40 mg via ORAL
  Filled 2015-06-17 (×2): qty 1

## 2015-06-17 MED ORDER — AMLODIPINE BESYLATE 5 MG PO TABS
5.0000 mg | ORAL_TABLET | Freq: Every day | ORAL | Status: DC
Start: 2015-06-17 — End: 2015-06-18
  Administered 2015-06-17 – 2015-06-18 (×2): 5 mg via ORAL
  Filled 2015-06-17 (×2): qty 1

## 2015-06-17 MED ORDER — METOPROLOL TARTRATE 25 MG PO TABS
25.0000 mg | ORAL_TABLET | Freq: Once | ORAL | Status: AC
  Administered 2015-06-17: 25 mg via ORAL
  Filled 2015-06-17: qty 1

## 2015-06-17 MED ORDER — POTASSIUM CHLORIDE ER 10 MEQ PO TBCR
10.0000 meq | EXTENDED_RELEASE_TABLET | Freq: Every day | ORAL | Status: DC
  Administered 2015-06-17 – 2015-06-18 (×2): 10 meq via ORAL
  Filled 2015-06-17 (×4): qty 1

## 2015-06-17 MED ORDER — METOPROLOL TARTRATE 25 MG PO TABS: 25.0000 mg | ORAL_TABLET | Freq: Two times a day (BID) | ORAL | Status: DC

## 2015-06-17 MED ORDER — LABETALOL HCL 5 MG/ML IV SOLN: 5.0000 mg | INTRAVENOUS | Status: DC | PRN

## 2015-06-17 MED ORDER — ENOXAPARIN SODIUM 40 MG/0.4ML ~~LOC~~ SOLN
40.0000 mg | SUBCUTANEOUS | Status: DC
  Administered 2015-06-17: 40 mg via SUBCUTANEOUS
  Filled 2015-06-17: qty 0.4

## 2015-06-17 MED ORDER — TAMSULOSIN HCL 0.4 MG PO CAPS
0.4000 mg | ORAL_CAPSULE | Freq: Every day | ORAL | Status: DC
  Administered 2015-06-17 – 2015-06-18 (×2): 0.4 mg via ORAL
  Filled 2015-06-17 (×2): qty 1

## 2015-06-17 MED ORDER — ASPIRIN EC 81 MG PO TBEC
81.0000 mg | DELAYED_RELEASE_TABLET | Freq: Every day | ORAL | Status: DC
Start: 2015-06-17 — End: 2015-06-18
  Administered 2015-06-17 – 2015-06-18 (×2): 81 mg via ORAL
  Filled 2015-06-17 (×2): qty 1

## 2015-06-17 MED ORDER — SODIUM CHLORIDE 0.9 % IJ SOLN: 3.0000 mL | INTRAMUSCULAR | Status: DC | PRN

## 2015-06-17 MED ORDER — ATORVASTATIN CALCIUM 40 MG PO TABS
40.0000 mg | ORAL_TABLET | Freq: Every day | ORAL | Status: DC
  Administered 2015-06-17: 40 mg via ORAL
  Filled 2015-06-17 (×2): qty 1

## 2015-06-17 MED ORDER — DOXAZOSIN MESYLATE 2 MG PO TABS
2.0000 mg | ORAL_TABLET | Freq: Every day | ORAL | Status: DC
  Filled 2015-06-17: qty 1

## 2015-06-17 MED ORDER — HYDROCODONE-ACETAMINOPHEN 7.5-325 MG PO TABS: 1.0000 | ORAL_TABLET | ORAL | Status: DC | PRN

## 2015-06-17 MED ORDER — DOXAZOSIN MESYLATE 2 MG PO TABS
2.0000 mg | ORAL_TABLET | Freq: Every day | ORAL | Status: DC
Start: 2015-06-17 — End: 2015-06-18
  Administered 2015-06-17 – 2015-06-18 (×2): 2 mg via ORAL
  Filled 2015-06-17 (×2): qty 1

## 2015-06-17 MED ORDER — SODIUM CHLORIDE 0.9 % IJ SOLN
3.0000 mL | Freq: Two times a day (BID) | INTRAMUSCULAR | Status: DC
  Administered 2015-06-18: 3 mL via INTRAVENOUS

## 2015-06-17 MED ORDER — NITROGLYCERIN 0.4 MG SL SUBL: 0.4000 mg | SUBLINGUAL_TABLET | SUBLINGUAL | Status: DC | PRN

## 2015-06-17 MED ORDER — METOPROLOL TARTRATE 25 MG PO TABS
25.0000 mg | ORAL_TABLET | Freq: Two times a day (BID) | ORAL | Status: DC
  Administered 2015-06-17 – 2015-06-18 (×2): 25 mg via ORAL
  Filled 2015-06-17 (×2): qty 1

## 2015-06-17 MED ORDER — ISOSORBIDE MONONITRATE ER 60 MG PO TB24: 60.0000 mg | ORAL_TABLET | Freq: Every day | ORAL | Status: DC

## 2015-06-17 MED ORDER — CLOPIDOGREL BISULFATE 75 MG PO TABS
75.0000 mg | ORAL_TABLET | Freq: Every day | ORAL | Status: DC
  Administered 2015-06-17 – 2015-06-18 (×2): 75 mg via ORAL
  Filled 2015-06-17 (×2): qty 1

## 2015-06-17 MED ORDER — FLUTICASONE PROPIONATE HFA 220 MCG/ACT IN AERO
1.0000 | INHALATION_SPRAY | Freq: Two times a day (BID) | RESPIRATORY_TRACT | Status: DC | PRN
Start: 2015-06-17 — End: 2015-06-17

## 2015-06-17 MED ORDER — ALBUTEROL SULFATE (2.5 MG/3ML) 0.083% IN NEBU: 3.0000 mL | INHALATION_SOLUTION | Freq: Two times a day (BID) | RESPIRATORY_TRACT | Status: DC | PRN

## 2015-06-17 MED ORDER — SODIUM CHLORIDE 0.9 % IJ SOLN
3.0000 mL | Freq: Two times a day (BID) | INTRAMUSCULAR | Status: DC
  Administered 2015-06-17 – 2015-06-18 (×2): 3 mL via INTRAVENOUS

## 2015-06-17 NOTE — ED Notes (Signed)
Per patient, patients PCP sent patient here for dizziness, confusion, headaches, and HTN. Pt had symptoms x2 weeks. Pt answers all orientation questions correctly. No neuro deficits noted at this time.

## 2015-06-17 NOTE — ED Notes (Signed)
Patient transported to X-ray 

## 2015-06-17 NOTE — ED Notes (Signed)
Pt transported to CT ?

## 2015-06-17 NOTE — ED Notes (Signed)
Attempted report 

## 2015-06-17 NOTE — H&P (Signed)
Date: 06/17/2015               Patient Name:  [name] MRN: [id number]  DOB: [birth year] Age / Sex: 81 y.o., male   PCP: Chip Boer, PA-C         Medical Service: Internal Medicine Teaching Service         Attending Physician: Dr. Oval Linsey, MD    First Contact: Dr. Loleta Chance Pager: [phone]  Second Contact: Dr. Duwaine Maxin Pager: [phone]       After Hours (After 5p/  First Contact Pager: [phone]  weekends / holidays): Second Contact Pager: [phone]   Chief Complaint: "My head hurts and I've been feeling a little confused."  History of Present Illness: Mr. Walbert is a 81 year-old man with history of coronary artery disease status-post bare metal stent placement in the right coronary artery in 2002, hypertension, and hyperlipidemia. While at his urology earlier this appointment this morning, his blood pressure was noted to be 204/100, however he doesn't take his medications on days when he goes to doctor's appointments. He explained that he doesn't want to eat or drink on those mornings out of fear he may be incontinent, so he holds his medications altogether. At his appointment, the urologist recommended he call his primary care doctor; when he called his PCP, he recommended going to emergency department, so he went. When we saw the patient, he endorsed a mild bifrontal headache that started this morning, he says he sometimes has mild chest pain and had some earlier today, and has felt slightly confused recently. He specifically denied change in vision, focal weakness, crushing chest pain, tearing back pain, abdominal pain, or shortness of breath. When we spoke to the son, he mentioned he's had some mild short-term memory loss over the last 5 days. He says he gets a little confused about names when he's telling stories but he can remember all the other details. The family chalked this up to him taking his Percocet, although he's been on this medicine for years. The son also  said he sometimes feels dizzy when he stands but this has been a longstanding issue. Otherwise has been his normal self and hasn't complained about anything. He says he thinks his dad takes all of his medications regularly but he's not sure because he lives in an apartment behind his house.   In the emergency department, the patient was hypertensive to 200/100, but his vitals were otherwise normal. A head CT was negative for bleed, ischemia, or mass, and only showed mild periventricular atrophy.   Meds: Current Facility-Administered Medications  Medication Dose Route Frequency Provider Last Rate Last Dose  . amLODipine (NORVASC) tablet 5 mg  5 mg Oral Daily Francesca Oman, DO   5 mg at 06/17/15 1545  . doxazosin (CARDURA) tablet 2 mg  2 mg Oral Daily Davonna Belling, MD   2 mg at 06/17/15 1425  . isosorbide mononitrate (IMDUR) 24 hr tablet 60 mg  60 mg Oral Daily Davonna Belling, MD   60 mg at 06/17/15 1425  . labetalol (NORMODYNE,TRANDATE) injection 5 mg  5 mg Intravenous Q2H PRN Loleta Chance, MD       Current Outpatient Prescriptions  Medication Sig Dispense Refill  . acetaminophen (TYLENOL) 325 MG tablet Take 2 tablets (650 mg total) by mouth every 4 (four) hours as needed for headache or mild pain.    Marland Kitchen albuterol (PROVENTIL HFA;VENTOLIN HFA) 108 (90 BASE) MCG/ACT inhaler Inhale 2 puffs into  the lungs 2 (two) times daily as needed for wheezing or shortness of breath.    Marland Kitchen amLODipine (NORVASC) 5 MG tablet Take 5 mg by mouth daily.    Marland Kitchen aspirin EC 81 MG tablet Take 1 tablet (81 mg total) by mouth daily.    Marland Kitchen atorvastatin (LIPITOR) 40 MG tablet TAKE 1 TABLET BY MOUTH AT BEDTIME FOR CHOLESTEROL 90 tablet 1  . Cholecalciferol (VITAMIN D-3) 5000 UNITS TABS Take 5,000 Units by mouth 2 (two) times daily.    . clopidogrel (PLAVIX) 75 MG tablet TAKE 1 TABLET BY MOUTH ONCE DAILY. 90 tablet 1  . colestipol (COLESTID) 1 G tablet Take 1 g by mouth 2 (two) times daily as needed (cholesterol).    .  cyanocobalamin 1000 MCG tablet Take 100 mcg by mouth daily.    . cyclobenzaprine (FLEXERIL) 5 MG tablet Take 5 mg by mouth 3 (three) times daily as needed for muscle spasms (Patient taking by mouth one and half tablets three times daily).    Marland Kitchen doxazosin (CARDURA) 2 MG tablet Take 1 tablet (2 mg total) by mouth daily. 90 tablet 3  . fluocinonide gel (LIDEX) 3.79 % Apply 1 application topically as needed (insect bites).    . fluticasone (FLOVENT HFA) 220 MCG/ACT inhaler Inhale 1 puff into the lungs 2 (two) times daily as needed (shortness of breath).     . furosemide (LASIX) 40 MG tablet Take 1 tablet (40 mg total) by mouth daily as needed for edema. 30 tablet 5  . HYDROcodone-acetaminophen (NORCO) 7.5-325 MG per tablet Take 1 tablet by mouth every 4 (four) hours as needed for moderate pain. FOR PAIN    . isosorbide mononitrate (IMDUR) 60 MG 24 hr tablet TAKE 1 TABLET BY MOUTH ONCE DAILY. 30 tablet 11  . metoprolol tartrate (LOPRESSOR) 25 MG tablet TAKE 1 TABLET BY MOUTH TWICE DAILY 180 tablet 1  . nitroGLYCERIN (NITROSTAT) 0.4 MG SL tablet Place 1 tablet (0.4 mg total) under the tongue every 5 (five) minutes as needed for chest pain. 25 tablet 3  . pantoprazole (PROTONIX) 40 MG tablet TAKE 1 TABLET BY MOUTH TWICE DAILY FOR REFLUX. 60 tablet 11  . potassium chloride (K-DUR) 10 MEQ tablet Take 1 tablet (10 mEq total) by mouth daily. 30 tablet 1  . tamsulosin (FLOMAX) 0.4 MG CAPS capsule Take 0.4 mg by mouth daily.     Marland Kitchen nystatin (MYCOSTATIN) 100000 UNIT/ML suspension Take 5 mLs (500,000 Units total) by mouth 4 (four) times daily. (Patient not taking: Reported on 06/17/2015) 120 mL 0  . ZETIA 10 MG tablet TAKE (1/2) TABLET BY MOUTH ONCE DAILY. (Patient not taking: Reported on 06/17/2015) 45 tablet 2    Allergies: Allergies as of 06/17/2015 - Review Complete 06/17/2015  Allergen Reaction Noted  . Budesonide-formoterol fumarate Swelling   . Iohexol Shortness Of Breath and Itching 01/09/2012  .  Simvastatin Other (See Comments) 10/20/2008  . Demerol [meperidine] Other (See Comments) 01/18/2013  . Ivp dye [iodinated diagnostic agents] Other (See Comments) 02/02/2012  . Meperidine hcl Other (See Comments) 10/20/2008  . Naproxen Swelling 11/05/2012  . Pregabalin Rash 11/05/2012  . Sulfonamide derivatives Rash 10/20/2008   Past Medical History  Diagnosis Date  . Hypertension   . Hyperlipidemia   . Chronic chest pain   . Dyspnea     chronic  . GERD (gastroesophageal reflux disease)     h/o esophageal spasm  . Asthma   . Back pain   . Coronary artery disease  a. s/p BMS to RCA in 2002; b. s/p cutting balloon POBA ;   c. cath 6/12: oDx 80% (treated with repeat cutting balloon POBA), mLAD 50% with 30-40% at Dx, CFX 30%, pRCA 25% with patent stents;  d.  Lex MV 4/14:  Low Risk - EF 61%, inf scar with peri-infarct ischemia  . Myocardial infarction 2001    x 1, confirned 1 possible  . Cancer late 1990's    melanoma   Past Surgical History  Procedure Laterality Date  . Cardiac catheterization    . Neck surgery      multiple  . Back surgery      multiple  . Laminectomy    . Posterior laminectomy / decompression cervical spine    . Anterior cervical decomp/discectomy fusion    . Esophagogastroduodenoscopy  03/23/2012    Procedure: ESOPHAGOGASTRODUODENOSCOPY (EGD);  Surgeon: Winfield Cunas., MD;  Location: [facility];  Service: Endoscopy;  Laterality: N/A;  . Savory dilation  03/23/2012    Procedure: SAVORY DILATION;  Surgeon: Winfield Cunas., MD;  Location: [facility];  Service: Endoscopy;  Laterality: N/A;  . Esophagogastroduodenoscopy N/A 03/04/2014    Procedure: ESOPHAGOGASTRODUODENOSCOPY (EGD);  Surgeon: Arta Silence, MD;  Location: [facility] ENDOSCOPY;  Service: Endoscopy;  Laterality: N/A;  . Coronary angioplasty with stent placement  x 2 stents     previous percutaneous intervention on the  RCA and the diagonal branch  . Eye surgery Bilateral yrs ago  . Colonoscopy  with propofol N/A 04/17/2014    Procedure: COLONOSCOPY WITH PROPOFOL;  Surgeon: Winfield Cunas., MD;  Location: [facility] ENDOSCOPY;  Service: Endoscopy;  Laterality: N/A;  . Left heart catheterization with coronary angiogram N/A 01/13/2014    Procedure: LEFT HEART CATHETERIZATION WITH CORONARY ANGIOGRAM;  Surgeon: Blane Ohara, MD;  Location: [facility] CATH LAB;  Service: Cardiovascular;  Laterality: N/A;   Family History  Problem Relation Age of Onset  . Diabetes Father   . Heart disease Father   . Asthma Father   . Heart disease Mother     CABG hx age 47  . Colon cancer Son     hx   . Colitis Son     hx  . Crohn's disease Son    Social History   Social History  . Marital Status: Married    Spouse Name: N/A  . Number of Children: N/A  . Years of Education: N/A   Occupational History  . retired     Clinical research associate   Social History Main Topics  . Smoking status: Never Smoker   . Smokeless tobacco: Never Used  . Alcohol Use: No  . Drug Use: No  . Sexual Activity: No   Other Topics Concern  . Not on file   Social History Narrative   Lives in [place]. Generaly active around the house and walks his dog without chest pain or SOB.    Review of Systems  Constitutional: Negative for fever and chills.  Eyes: Negative for blurred vision, double vision and pain.  Cardiovascular: Negative for chest pain, palpitations and PND.  Gastrointestinal: Negative for heartburn and nausea.  Genitourinary: Negative for dysuria.  Musculoskeletal: Positive for back pain.  Skin: Negative for rash.  Neurological: Positive for dizziness and headaches. Negative for tremors, focal weakness and loss of consciousness.     Physical Exam: Blood pressure 192/90, pulse 59, temperature 98.6 F (37 C), temperature source Oral, resp. rate 18, SpO2 99 %.   General: white elderly man  lying in bed HEENT: PERRL, EOMI, no scleral icterus Cardiac: RRR, no rubs, murmurs or gallops Pulm: clear to  auscultation bilaterally, moving normal volumes of air Abd: soft, nontender, nondistended, BS present Ext: warm and well perfused, no pedal edema Neuro: alert and oriented X3, cranial nerves II-XII grossly intact  Lab results: Basic Metabolic Panel:  Recent Labs  06/17/15 1229  NA 139  K 3.5  CL 104  CO2 31  GLUCOSE 98  BUN 7  CREATININE 0.86  CALCIUM 9.1   Liver Function Tests:  Recent Labs  06/17/15 1229  AST 21  ALT 13*  ALKPHOS 66  BILITOT 1.2  PROT 5.8*  ALBUMIN 3.5   CBC:  Recent Labs  06/17/15 1229  WBC 5.9  NEUTROABS 3.6  HGB 12.8*  HCT 38.5*  MCV 90.2  PLT 218   Imaging results:  Dg Chest 2 View  06/17/2015   CLINICAL DATA:  Shortness of breath. Dizziness. Asthma. Previous myocardial infarct.  EXAM: CHEST  2 VIEW  COMPARISON:  03/03/2014  FINDINGS: The heart size and mediastinal contours are within normal limits. Mild tortuosity of thoracic aorta is stable. Both lungs are clear. No evidence of pneumothorax or pleural effusion. Fusion hardware noted within the cervical and lumbar spine.  IMPRESSION: No active cardiopulmonary disease.   Electronically Signed   By: Earle Gell M.D.   On: 06/17/2015 12:47   Ct Head Wo Contrast  06/17/2015   CLINICAL DATA:  Confusion with dizziness and headaches for approximately 2 weeks.  EXAM: CT HEAD WITHOUT CONTRAST  TECHNIQUE: Contiguous axial images were obtained from the base of the skull through the vertex without intravenous contrast.  COMPARISON:  Brain MRI Mar 01, 2010  FINDINGS: There is mild diffuse atrophy. There is no intracranial mass, hemorrhage, extra-axial fluid collection, or midline shift. There is small vessel disease in the centra semiovale bilaterally, progressed since 2011. No acute infarct evident. Bony calvarium appears intact. Mastoid air cells are clear. There is mucosal thickening in the right maxillary antrum.  IMPRESSION: Atrophy with periventricular small vessel disease. No intracranial mass,  hemorrhage, or acute appearing infarct. Right maxillary sinus disease present.   Electronically Signed   By: Lowella Grip III M.D.   On: 06/17/2015 13:02   Other results: EKG: Rate 60, normal sinus rhythm, normal axis, good R wave progression, no ST changes  Assessment & Plan by Problem: Mr. Bertoli is an 81 year-old man with history of coronary artery disease presenting with three problems:  1) Hypertensive urgency in the setting of not taking his antihypertensives  2) Chronic, step-wise progression of short-term memory loss, concerning for vascular dementia versus progression versus Alzheimer's dementia  3) Mild chest pain with EKG negative for ischemic changes and negative point-of-care troponin  Hypertensive urgency: He has no evidence of end organ damage and his pressure are normalizing after re-starting his home anti-hypertensive regiment. -Continue home regiment -PRN labetalol for pressures greater than 180/110  Mild chest pain: I do not this he has acute coronary syndrome; however, he does have a significant history of coronary artery disease. We'll check another troponin at midnight (12 hours from the negative point-of-care) and repeat an EKG tomorrow morning. Aortic dissection also crossed my mind given his hypertension but his chest pain is mild, not radiating to his back, and chest x-ray did not show a widened mediastinum. -Troponin at midnight -EKG in the morning -Consider starting ACEi once pressures normalize  Short-term memory loss: Per above. -This can be managed in  the outpatient setting -Physical therapy consult  Dispo: Disposition is deferred at this time, awaiting improvement of current medical problems. Probably home tomorrow.  The patient does have a current PCP (Elin Claggett, PA-C) and does need an [facility] hospital follow-up appointment after discharge.  The patient does have transportation limitations that hinder transportation to clinic  appointments.  Signed: Loleta Chance, MD 06/17/2015, 3:58 PM

## 2015-06-17 NOTE — ED Provider Notes (Signed)
CSN: [id number]     Arrival date & time 06/17/15  1105 History   First MD Initiated Contact with Patient 06/17/15 1113     Chief Complaint  Patient presents with  . Hypertension     (Consider location/radiation/quality/duration/timing/severity/associated sxs/prior Treatment) Patient is a 81 y.o. male presenting with hypertension. The history is provided by the patient and a relative.  Hypertension This is a recurrent problem. Associated symptoms include headaches. Pertinent negatives include no chest pain, no abdominal pain and no shortness of breath.   patient presents with confusion that began this weekend. States it was worse weekend but now on Wednesday is still there but not as bad as it was. Patient's family member states he was having trouble remembering stories and completing the history was talking about. Does have some slight memory issues at baseline but usually not like this. Patient may have had some headaches. States she was seen in urology today and found her blood pressure 053 systolic. States he did not take his blood pressure medicines this morning. No localizing numbness weakness. No chest pain. No trouble breathing.  Past Medical History  Diagnosis Date  . Hypertension   . Hyperlipidemia   . Chronic chest pain   . GERD (gastroesophageal reflux disease)     h/o esophageal spasm  . Asthma   . Coronary artery disease     a. s/p BMS to RCA in 2002; b. s/p cutting balloon POBA ;   c. cath 6/12: oDx 80% (treated with repeat cutting balloon POBA), mLAD 50% with 30-40% at Dx, CFX 30%, pRCA 25% with patent stents;  d.  Lex MV 4/14:  Low Risk - EF 61%, inf scar with peri-infarct ischemia  . Myocardial infarction 2001    x 1, confirned 1 possible  . Dyspnea     chronic  . History of blood transfusion     "related to OR"  . History of hiatal hernia   . Headache   . Arthritis     "hips, back" (06/17/2015)  . Chronic lower back pain   . Melanoma of lower back late 1990's    Past Surgical History  Procedure Laterality Date  . Cervical disc surgery  multiple  . Back surgery  multiple  . Laminectomy    . Posterior laminectomy / decompression cervical spine    . Anterior cervical decomp/discectomy fusion    . Esophagogastroduodenoscopy  03/23/2012    Procedure: ESOPHAGOGASTRODUODENOSCOPY (EGD);  Surgeon: Winfield Cunas., MD;  Location: [facility];  Service: Endoscopy;  Laterality: N/A;  . Savory dilation  03/23/2012    Procedure: SAVORY DILATION;  Surgeon: Winfield Cunas., MD;  Location: [facility];  Service: Endoscopy;  Laterality: N/A;  . Esophagogastroduodenoscopy N/A 03/04/2014    Procedure: ESOPHAGOGASTRODUODENOSCOPY (EGD);  Surgeon: Arta Silence, MD;  Location: [facility] ENDOSCOPY;  Service: Endoscopy;  Laterality: N/A;  . Cataract extraction w/ intraocular lens  implant, bilateral Bilateral   . Colonoscopy with propofol N/A 04/17/2014    Procedure: COLONOSCOPY WITH PROPOFOL;  Surgeon: Winfield Cunas., MD;  Location: [facility] ENDOSCOPY;  Service: Endoscopy;  Laterality: N/A;  . Left heart catheterization with coronary angiogram N/A 01/13/2014    Procedure: LEFT HEART CATHETERIZATION WITH CORONARY ANGIOGRAM;  Surgeon: Blane Ohara, MD;  Location: [facility] CATH LAB;  Service: Cardiovascular;  Laterality: N/A;  . Tonsillectomy  1930's  . Cardiac catheterization      "I've had 17 caths" (06/17/2015)  . Coronary angioplasty with stent placement  x 2  stents     previous percutaneous intervention on the  RCA and the diagonal branch  . Melanoma excision  late 1990's    "lower back"   Family History  Problem Relation Age of Onset  . Diabetes Father   . Heart disease Father   . Asthma Father   . Heart disease Mother     CABG hx age 25  . Colon cancer Son     hx   . Colitis Son     hx  . Crohn's disease Son    Social History  Substance Use Topics  . Smoking status: Never Smoker   . Smokeless tobacco: Never Used  . Alcohol Use: No    Review of Systems   Constitutional: Negative for activity change and appetite change.  Eyes: Negative for pain.  Respiratory: Negative for chest tightness and shortness of breath.   Cardiovascular: Negative for chest pain and leg swelling.  Gastrointestinal: Negative for nausea, vomiting, abdominal pain and diarrhea.  Genitourinary: Negative for flank pain.  Musculoskeletal: Negative for back pain and neck stiffness.  Skin: Negative for rash.  Neurological: Positive for speech difficulty and headaches. Negative for weakness and numbness.  Psychiatric/Behavioral: Negative for behavioral problems.      Allergies  Budesonide-formoterol fumarate; Iohexol; Simvastatin; Demerol; Ivp dye; Meperidine hcl; Naproxen; Pregabalin; and Sulfonamide derivatives  Home Medications   Prior to Admission medications   Medication Sig Start Date End Date Taking? Authorizing Provider  acetaminophen (TYLENOL) 325 MG tablet Take 2 tablets (650 mg total) by mouth every 4 (four) hours as needed for headache or mild pain. 12/16/13  Yes Luke K Kilroy, PA-C  albuterol (PROVENTIL HFA;VENTOLIN HFA) 108 (90 BASE) MCG/ACT inhaler Inhale 2 puffs into the lungs 2 (two) times daily as needed for wheezing or shortness of breath.   Yes Historical Provider, MD  amLODipine (NORVASC) 5 MG tablet Take 5 mg by mouth daily.   Yes Historical Provider, MD  aspirin EC 81 MG tablet Take 1 tablet (81 mg total) by mouth daily. 03/08/14  Yes Maryann Mikhail, DO  atorvastatin (LIPITOR) 40 MG tablet TAKE 1 TABLET BY MOUTH AT BEDTIME FOR CHOLESTEROL 04/16/15  Yes Fay Records, MD  Cholecalciferol (VITAMIN D-3) 5000 UNITS TABS Take 5,000 Units by mouth 2 (two) times daily.   Yes Historical Provider, MD  clopidogrel (PLAVIX) 75 MG tablet TAKE 1 TABLET BY MOUTH ONCE DAILY. 04/16/15  Yes Fay Records, MD  colestipol (COLESTID) 1 G tablet Take 1 g by mouth 2 (two) times daily as needed (cholesterol).   Yes Historical Provider, MD  cyanocobalamin 1000 MCG tablet Take  100 mcg by mouth daily.   Yes Historical Provider, MD  cyclobenzaprine (FLEXERIL) 5 MG tablet Take 5 mg by mouth 3 (three) times daily as needed for muscle spasms (Patient taking by mouth one and half tablets three times daily).   Yes Historical Provider, MD  doxazosin (CARDURA) 2 MG tablet Take 1 tablet (2 mg total) by mouth daily. 01/19/15  Yes Fay Records, MD  fluocinonide gel (LIDEX) 0.27 % Apply 1 application topically as needed (insect bites).   Yes Historical Provider, MD  fluticasone (FLOVENT HFA) 220 MCG/ACT inhaler Inhale 1 puff into the lungs 2 (two) times daily as needed (shortness of breath).    Yes Historical Provider, MD  furosemide (LASIX) 40 MG tablet Take 1 tablet (40 mg total) by mouth daily as needed for edema. 01/30/14  Yes Liliane Shi, PA-C  HYDROcodone-acetaminophen (NORCO) 7.5-325 MG  per tablet Take 1 tablet by mouth every 4 (four) hours as needed for moderate pain. FOR PAIN 06/05/14  Yes Historical Provider, MD  isosorbide mononitrate (IMDUR) 60 MG 24 hr tablet TAKE 1 TABLET BY MOUTH ONCE DAILY. 03/13/15  Yes Fay Records, MD  metoprolol tartrate (LOPRESSOR) 25 MG tablet TAKE 1 TABLET BY MOUTH TWICE DAILY 04/16/15  Yes Fay Records, MD  nitroGLYCERIN (NITROSTAT) 0.4 MG SL tablet Place 1 tablet (0.4 mg total) under the tongue every 5 (five) minutes as needed for chest pain. 05/28/15  Yes Fay Records, MD  pantoprazole (PROTONIX) 40 MG tablet TAKE 1 TABLET BY MOUTH TWICE DAILY FOR REFLUX. 03/13/15  Yes Fay Records, MD  potassium chloride (K-DUR) 10 MEQ tablet Take 1 tablet (10 mEq total) by mouth daily. 04/22/14  Yes Fay Records, MD  tamsulosin (FLOMAX) 0.4 MG CAPS capsule Take 0.4 mg by mouth daily.  01/13/15  Yes Historical Provider, MD  nystatin (MYCOSTATIN) 100000 UNIT/ML suspension Take 5 mLs (500,000 Units total) by mouth 4 (four) times daily. Patient not taking: Reported on 06/17/2015 07/11/14   Fay Records, MD  ZETIA 10 MG tablet TAKE (1/2) TABLET BY MOUTH ONCE DAILY. Patient  not taking: Reported on 06/17/2015 09/15/14   Fay Records, MD   BP 155/74 mmHg  Pulse 58  Temp(Src) 98.4 F (36.9 C) (Oral)  Resp 18  Ht 5\' 8"  (1.727 m)  Wt 169 lb 11.2 oz (76.975 kg)  BMI 25.81 kg/m2  SpO2 99% Physical Exam  Constitutional: He is oriented to person, place, and time. He appears well-developed and well-nourished.  HENT:  Head: Atraumatic.  Eyes: EOM are normal.  Neck: Neck supple.  Cardiovascular: Normal rate and regular rhythm.   Pulmonary/Chest: Effort normal.  Abdominal: Soft. There is no tenderness.  Musculoskeletal: Normal range of motion.  Neurological: He is alert and oriented to person, place, and time. No cranial nerve deficit.  Good grip strength bilaterally  Skin: Skin is warm.    ED Course  Procedures (including critical care time) Labs Review Labs Reviewed  COMPREHENSIVE METABOLIC PANEL - Abnormal; Notable for the following:    Total Protein 5.8 (*)    ALT 13 (*)    Anion gap 4 (*)    All other components within normal limits  URINALYSIS, ROUTINE W REFLEX MICROSCOPIC (NOT AT [facility]) - Abnormal; Notable for the following:    Hgb urine dipstick TRACE (*)    All other components within normal limits  URINE RAPID DRUG SCREEN, HOSP PERFORMED - Abnormal; Notable for the following:    Opiates POSITIVE (*)    All other components within normal limits  CBC WITH DIFFERENTIAL/PLATELET - Abnormal; Notable for the following:    Hemoglobin 12.8 (*)    HCT 38.5 (*)    All other components within normal limits  ETHANOL  PROTIME-INR  URINE MICROSCOPIC-ADD ON  TROPONIN I  Randolm Idol, ED    Imaging Review Dg Chest 2 View  06/17/2015   CLINICAL DATA:  Shortness of breath. Dizziness. Asthma. Previous myocardial infarct.  EXAM: CHEST  2 VIEW  COMPARISON:  03/03/2014  FINDINGS: The heart size and mediastinal contours are within normal limits. Mild tortuosity of thoracic aorta is stable. Both lungs are clear. No evidence of pneumothorax or pleural  effusion. Fusion hardware noted within the cervical and lumbar spine.  IMPRESSION: No active cardiopulmonary disease.   Electronically Signed   By: Earle Gell M.D.   On: 06/17/2015 12:47  Ct Head Wo Contrast  06/17/2015   CLINICAL DATA:  Confusion with dizziness and headaches for approximately 2 weeks.  EXAM: CT HEAD WITHOUT CONTRAST  TECHNIQUE: Contiguous axial images were obtained from the base of the skull through the vertex without intravenous contrast.  COMPARISON:  Brain MRI Mar 01, 2010  FINDINGS: There is mild diffuse atrophy. There is no intracranial mass, hemorrhage, extra-axial fluid collection, or midline shift. There is small vessel disease in the centra semiovale bilaterally, progressed since 2011. No acute infarct evident. Bony calvarium appears intact. Mastoid air cells are clear. There is mucosal thickening in the right maxillary antrum.  IMPRESSION: Atrophy with periventricular small vessel disease. No intracranial mass, hemorrhage, or acute appearing infarct. Right maxillary sinus disease present.   Electronically Signed   By: Lowella Grip III M.D.   On: 06/17/2015 13:02   I have personally reviewed and evaluated these images and lab results as part of my medical decision-making.   EKG Interpretation   Date/Time:  Wednesday June 17 2015 11:18:19 EDT Ventricular Rate:  64 PR Interval:  167 QRS Duration: 87 QT Interval:  423 QTC Calculation: 436 R Axis:   28 Text Interpretation:  Sinus rhythm ED PHYSICIAN INTERPRETATION AVAILABLE  IN [place] Confirmed by TEST, Record ([id number]) on 06/18/2015 6:53:18  AM      MDM   Final diagnoses:  Confusion  Essential hypertension    Patient with hypertension. Had not taken his medicines this morning. Also had episodes of confusion. Unsure of blood pressure was the cause or some worsening of his dementia. Will admit to hospital for further evaluation.    Davonna Belling, MD 06/18/15 [phone]

## 2015-06-17 NOTE — ED Notes (Signed)
Admitting MD at bedside. Discussed BP with internal medicine, they will place PRN order for labetolol

## 2015-06-18 DIAGNOSIS — N320 Bladder-neck obstruction: Secondary | ICD-10-CM | POA: Insufficient documentation

## 2015-06-18 DIAGNOSIS — R51 Headache: Secondary | ICD-10-CM | POA: Diagnosis not present

## 2015-06-18 DIAGNOSIS — I10 Essential (primary) hypertension: Secondary | ICD-10-CM | POA: Insufficient documentation

## 2015-06-18 DIAGNOSIS — R410 Disorientation, unspecified: Secondary | ICD-10-CM | POA: Diagnosis not present

## 2015-06-18 DIAGNOSIS — I25119 Atherosclerotic heart disease of native coronary artery with unspecified angina pectoris: Secondary | ICD-10-CM | POA: Insufficient documentation

## 2015-06-18 DIAGNOSIS — G8929 Other chronic pain: Secondary | ICD-10-CM | POA: Diagnosis not present

## 2015-06-18 LAB — TROPONIN I: Troponin I: <0.03 ng/mL (ref <0.031)

## 2015-06-18 MED ORDER — AMLODIPINE BESYLATE 5 MG PO TABS: 5.0000 mg | ORAL_TABLET | Freq: Once | ORAL | Status: DC

## 2015-06-18 MED ORDER — AMLODIPINE BESYLATE 5 MG PO TABS
5.0000 mg | ORAL_TABLET | Freq: Once | ORAL | Status: AC
  Administered 2015-06-18: 5 mg via ORAL
  Filled 2015-06-18: qty 1

## 2015-06-18 MED ORDER — AMLODIPINE BESYLATE 5 MG PO TABS: 5.0000 mg | ORAL_TABLET | Freq: Every day | ORAL | Status: DC

## 2015-06-18 MED ORDER — AMLODIPINE BESYLATE 10 MG PO TABS: 10.0000 mg | ORAL_TABLET | Freq: Every day | ORAL | Status: DC

## 2015-06-18 MED ORDER — LISINOPRIL 10 MG PO TABS: 10.0000 mg | ORAL_TABLET | Freq: Every day | ORAL | Status: DC

## 2015-06-18 NOTE — H&P (Signed)
Internal Medicine Attending Admission Note Date: 06/18/2015  Patient name: [name] Medical record number: [id number] Date of birth: Mar 20, 1931 Age: 81 y.o. Gender: male  I saw and evaluated the patient. I reviewed the resident's note and I agree with the resident's findings and plan as documented in the resident's note.  Chief Complaint(s): Headache, short-term memory deficit, and hypertension.  History - key components related to admission:  [name] is an 81 year old man with a history of coronary artery disease status post percutaneous coronary intervention with a bare-metal stent, hypertension, hyperlipidemia, and bladder outlet obstruction presumably secondary to benign prostatic hypertrophy who presents to the emergency department at his doctor's request for evaluation of his hypertension. He did not take his blood pressure medications prior to seeing the urologist where he was found to have a blood pressure of 204/100. The primary care [name] was called and recommended the patient be sent from the urologist's office to the emergency department. In the emergency department the patient noted he had a mild headache that was bitemporal as well as some mild confusion which the son notes has been going on for a couple of days. He was therefore admitted to the internal medicine teaching service for further evaluation and care.  The patient was placed back on his home antihypertensive regimen with a marked improvement in his blood pressures. His headaches also improved. Upon further questioning this morning he noted orthostatic dizziness when he stood up as well as stable angina that occurs at rest and spontaneously resolves. He was without other complaints this morning.  Physical Exam - key components related to admission:  Filed Vitals:   06/17/15 2059 06/18/15 0500 06/18/15 0844 06/18/15 1426  BP: 149/85 155/74 171/71 139/87  Pulse:  58  73  Temp: 98.4 F (36.9 C) 98.4 F (36.9 C)   98.7 F (37.1 C)  TempSrc: Oral Oral  Oral  Resp: 18     Height:      Weight:  169 lb 11.2 oz (76.975 kg)    SpO2: 97% 99%  98%   Gen.: Well-developed, well-nourished, chronically ill-appearing elderly man lying comfortably in bed in no acute distress. He was somewhat invasive 1 question. Lungs: Clear to auscultation bilaterally without wheezes, rhonchi, or rales. Heart: Regular rate and rhythm without murmurs, rubs, or gallops. Abdomen: Soft, nontender, active bowel sounds.  Lab results:  Basic Metabolic Panel:  Recent Labs  06/17/15 1229  NA 139  K 3.5  CL 104  CO2 31  GLUCOSE 98  BUN 7  CREATININE 0.86  CALCIUM 9.1   Liver Function Tests:  Recent Labs  06/17/15 1229  AST 21  ALT 13*  ALKPHOS 66  BILITOT 1.2  PROT 5.8*  ALBUMIN 3.5   CBC:  Recent Labs  06/17/15 1229  WBC 5.9  NEUTROABS 3.6  HGB 12.8*  HCT 38.5*  MCV 90.2  PLT 218   Cardiac Enzymes:  Recent Labs  06/17/15 2351  TROPONINI <0.03   Coagulation:  Recent Labs  06/17/15 1229  INR 1.05   Urine Drug Screen:  Positive for opiates otherwise unremarkable  Alcohol Level:  Recent Labs  06/17/15 [place] <5   Urinalysis:  Clear, specific gravity 1.023, pH 5.5, trace hemoglobin, negative nitrite, negative leukocytes, 0-2 white blood cells per high-power field, 0-2 red blood cells per high-power field.  Imaging results:  Dg Chest 2 View  06/17/2015   CLINICAL DATA:  Shortness of breath. Dizziness. Asthma. Previous myocardial infarct.  EXAM: CHEST  2 VIEW  COMPARISON:  03/03/2014  FINDINGS: The heart size and mediastinal contours are within normal limits. Mild tortuosity of thoracic aorta is stable. Both lungs are clear. No evidence of pneumothorax or pleural effusion. Fusion hardware noted within the cervical and lumbar spine.  IMPRESSION: No active cardiopulmonary disease.   Electronically Signed   By: Earle Gell M.D.   On: 06/17/2015 12:47   Ct Head Wo Contrast  06/17/2015    CLINICAL DATA:  Confusion with dizziness and headaches for approximately 2 weeks.  EXAM: CT HEAD WITHOUT CONTRAST  TECHNIQUE: Contiguous axial images were obtained from the base of the skull through the vertex without intravenous contrast.  COMPARISON:  Brain MRI Mar 01, 2010  FINDINGS: There is mild diffuse atrophy. There is no intracranial mass, hemorrhage, extra-axial fluid collection, or midline shift. There is small vessel disease in the centra semiovale bilaterally, progressed since 2011. No acute infarct evident. Bony calvarium appears intact. Mastoid air cells are clear. There is mucosal thickening in the right maxillary antrum.  IMPRESSION: Atrophy with periventricular small vessel disease. No intracranial mass, hemorrhage, or acute appearing infarct. Right maxillary sinus disease present.   Electronically Signed   By: Lowella Grip III M.D.   On: 06/17/2015 13:02   Other results:  EKG (06/17/2015): Normal sinus rhythm at 64 bpm, normal axis, normal intervals, no significant Q waves, no LVH by voltage, good R wave progression, no ST or T-wave changes, unchanged from the previous ECG on 07/04/2014  EKG (06/18/2015): Normal sinus bradycardia at 58 bpm, normal axis, normal intervals, no significant Q waves, no LVH by voltage, good R wave progression, no ST or T wave changes. Unchanged from the previous ECG on 06/17/2015.  Assessment & Plan by Problem:  [name] is an 81 year old man with a history of coronary artery disease status post percutaneous coronary intervention complicated by stable angina, hypertension, hyperlipidemia, and bladder outlet obstruction symptoms presumably secondary to benign prostatic hypertrophy who presents with headaches, mild confusion, and hypertension after holding his antihypertensive medications prior to a urology appointment. He was admitted for further evaluation and his blood pressure markedly improved when restarted on his home medications. He did note chronic  stable angina on a near daily basis. He also noted nocturia 5 as well as orthostatic dizziness upon standing. Review of his antihypertensive regimen revealed a possible escalation not only in his blood pressure medication, but in his anti-anginal medications. It also revealed that he was on 2 peripheral alpha blockers which could explain his orthostatic dizziness. Unfortunately, despite the 2 alpha blockers he continued to have nocturia. As his hypertension, coronary artery disease with chronic stable angina, and bladder outlet obstruction symptoms are chronic conditions further titration of his medical regimen can be done as an outpatient.  1) Chronic hypertension: We will continue the metoprolol 25 mg by mouth twice daily as this is heart rate limited at the current dose. We will also continue the Imdur 60 mg by mouth daily given that he has chronic stable angina. Finally, we will increase amlodipine from 5 mg daily to 10 mg daily both for its antihypertensive effects as well as for its antianginal effects. Because he is on Doxazosin 2 mg by mouth at night and tamsulosin 0.4 mg by mouth at night and has orthostatic dizziness with significant nocturia despite the 2 peripheral alpha agents we decided to stop the doxazosin.  2) Coronary artery disease with chronic stable angina: We will continue the metoprolol at 25 mg by mouth  twice daily, Imdur 60 mg by mouth daily, and increase amlodipine to 10 mg by mouth daily. If he continues to have chronic stable angina one could consider increasing the Imdur dose to 120 mg by mouth daily if his blood pressure will tolerate it in the future. This can be decided upon after reevaluation of symptoms in the primary care [facility]idi Modesitt's clinic.  3) Bladder outlet obstruction symptoms presumably secondary to benign prostatic hypertrophy: We will be stopping the doxazosin given his orthostatic dizziness symptoms and the fact that he is already on tamsulosin 0.4 mg by mouth  nightly. He has a mildly elevated PSA and further evaluation for this can be decided if appropriate by his primary care Noelle Hoogland and urologist. One could also consider the addition of finasteride in the future to the tamsulosin in hopes of improving his nocturia realizing that the tamsulosin will take several weeks before it becomes clinically effective and that it could falsely lower the PSA. Again, this decision on how to manage his bladder outlet obstruction symptoms will be deferred to his primary care Lisset Ketchem and urologist.  4) Disposition: He is stable for discharge home with follow-up in his primary care Doniven Vanpatten's office as well as his urologist's office.

## 2015-06-18 NOTE — Discharge Instructions (Signed)
Start taking (new dose):  Amlodipine 10 mg 1 tablet by mouth everyday    Continue taking the following blood pressure medications:  Imdur 60 mg 1 tablet by mouth daily  Metoprolol 25 mg 1 tablet by mouth daily   STOP taking (old dose):  Amlodipine 5 mg 1 tablet by mouth everyday    Please follow up with your primary care physician within 1 week.    Hypertension Hypertension, commonly called high blood pressure, is when the force of blood pumping through your arteries is too strong. Your arteries are the blood vessels that carry blood from your heart throughout your body. A blood pressure reading consists of a higher number over a lower number, such as 110/72. The higher number (systolic) is the pressure inside your arteries when your heart pumps. The lower number (diastolic) is the pressure inside your arteries when your heart relaxes. Ideally you want your blood pressure below 120/80. Hypertension forces your heart to work harder to pump blood. Your arteries may become narrow or stiff. Having hypertension puts you at risk for heart disease, stroke, and other problems.  RISK FACTORS Some risk factors for high blood pressure are controllable. Others are not.  Risk factors you cannot control include:   Race. You may be at higher risk if you are African American.  Age. Risk increases with age.  Gender. Men are at higher risk than women before age 85 years. After age 19, women are at higher risk than men. Risk factors you can control include:  Not getting enough exercise or physical activity.  Being overweight.  Getting too much fat, sugar, calories, or salt in your diet.  Drinking too much alcohol. SIGNS AND SYMPTOMS Hypertension does not usually cause signs or symptoms. Extremely high blood pressure (hypertensive crisis) may cause headache, anxiety, shortness of breath, and nosebleed. DIAGNOSIS  To check if you have hypertension, your health care provider will measure your  blood pressure while you are seated, with your arm held at the level of your heart. It should be measured at least twice using the same arm. Certain conditions can cause a difference in blood pressure between your right and left arms. A blood pressure reading that is higher than normal on one occasion does not mean that you need treatment. If one blood pressure reading is high, ask your health care provider about having it checked again. TREATMENT  Treating high blood pressure includes making lifestyle changes and possibly taking medicine. Living a healthy lifestyle can help lower high blood pressure. You may need to change some of your habits. Lifestyle changes may include:  Following the DASH diet. This diet is high in fruits, vegetables, and whole grains. It is low in salt, red meat, and added sugars.  Getting at least 2 hours of brisk physical activity every week.  Losing weight if necessary.  Not smoking.  Limiting alcoholic beverages.  Learning ways to reduce stress. If lifestyle changes are not enough to get your blood pressure under control, your health care provider may prescribe medicine. You may need to take more than one. Work closely with your health care provider to understand the risks and benefits. HOME CARE INSTRUCTIONS  Have your blood pressure rechecked as directed by your health care provider.   Take medicines only as directed by your health care provider. Follow the directions carefully. Blood pressure medicines must be taken as prescribed. The medicine does not work as well when you skip doses. Skipping doses also puts you at risk  for problems.   Do not smoke.   Monitor your blood pressure at home as directed by your health care provider. SEEK MEDICAL CARE IF:   You think you are having a reaction to medicines taken.  You have recurrent headaches or feel dizzy.  You have swelling in your ankles.  You have trouble with your vision. SEEK IMMEDIATE MEDICAL  CARE IF:  You develop a severe headache or confusion.  You have unusual weakness, numbness, or feel faint.  You have severe chest or abdominal pain.  You vomit repeatedly.  You have trouble breathing. MAKE SURE YOU:   Understand these instructions.  Will watch your condition.  Will get help right away if you are not doing well or get worse. Document Released: 10/03/2005 Document Revised: 02/17/2014 Document Reviewed: 07/26/2013 [facility] Patient Information 2015 [place], [place]. This information is not intended to replace advice given to you by your health care provider. Make sure you discuss any questions you have with your health care provider.

## 2015-06-18 NOTE — Evaluation (Signed)
Physical Therapy Evaluation Patient Details Name: [name] MRN: [id number] DOB: 1930/11/08 Today's Date: 06/18/2015   History of Present Illness  81 y.o. male admitted to [facility] on 06/17/15 for HTN, HA, chest pain and memory loss.  Pt with significant PMHx of HTN, chronic chest pain, CAD, MI, dyspnea, HA, chronic low back pain multiple low back surgeryies, and ACDF, and multiple stents.   Clinical Impression  PT was only able to assess bed mobility, basic transfer to stand, and standing balance while donning his pants EOB.  He does seem unsteady on his feet, but since he refused to walk I am unable to get a sense for his safety during gait. He is not open to the idea of physical therapy and I believe even if we sent HHPT out he would refuse their services.  RN and RN case Freight forwarder informed.     Follow Up Recommendations No PT follow up;Other (comment) (at this time due to pt non-compliance)    Equipment Recommendations  None recommended by PT;Other (comment) (at this time due to limited assessment)    Recommendations for Other Services   NA    Precautions / Restrictions Precautions Precautions: Fall Precaution Comments: per RN h/o falls at home      Mobility  Bed Mobility Overal bed mobility: Modified Independent             General bed mobility comments: HOB elevated and pt using railings for leverage to pull up to sitting and to get back to supine.   Transfers Overall transfer level: Needs assistance Equipment used: None (holding bed rail at times) Transfers: Sit to/from Stand Sit to Stand: Supervision         General transfer comment: Heavy reliance on upper extremity support during transitions to help power up and also for balance.  LEs supported by bed as well and posterior preference throughout.  Close supervision for safety.   Ambulation/Gait             General Gait Details: Pt refused to walk.  Per pt report he uses a cane normally.           Balance Overall balance assessment: Needs assistance Sitting-balance support: Feet supported;No upper extremity supported Sitting balance-Leahy Scale: Good     Standing balance support: No upper extremity supported Standing balance-Leahy Scale: Fair Standing balance comment: In standing he stumbled back when attempting to pull up his pants.  He caught himself on the railing.                              Pertinent Vitals/Pain Pain Assessment: Faces Faces Pain Scale: Hurts little more Pain Location: pt reports  chronic neck and back pain Pain Descriptors / Indicators: Constant;Grimacing Pain Intervention(s): Limited activity within patient's tolerance;Monitored during session;Repositioned    Home Living Family/patient expects to be discharged to:: Private residence Living Arrangements: Other (Comment) (pt reports he lives with his son, chart says son nearby) Available Help at Discharge: Family;Other (Comment) (unsure of availability)             Additional Comments: When asking patient if there are any steps at his home he reports, "I don't remember"    Prior Function Level of Independence: Independent with assistive device(s)         Comments: per pt he uses a cane to walk.  Unsure of any other assist.         Extremity/Trunk Assessment  Upper Extremity Assessment: Overall WFL for tasks assessed           Lower Extremity Assessment: Generalized weakness      Cervical / Trunk Assessment: Other exceptions  Communication   Communication: HOH  Cognition Arousal/Alertness: Awake/alert Behavior During Therapy: Agitated (paranoid) Overall Cognitive Status: No family/caregiver present to determine baseline cognitive functioning       Memory: Decreased short-term memory                       Assessment/Plan    PT Assessment Patient needs continued PT services  PT Diagnosis Difficulty walking;Abnormality of gait;Generalized  weakness;Altered mental status   PT Problem List Decreased strength;Decreased balance;Decreased activity tolerance;Decreased mobility;Decreased cognition;Decreased safety awareness;Pain;Cardiopulmonary status limiting activity  PT Treatment Interventions DME instruction;Gait training;Stair training;Functional mobility training;Therapeutic activities;Therapeutic exercise;Balance training;Neuromuscular re-education;Cognitive remediation;Patient/family education   PT Goals (Current goals can be found in the Care Plan section) Acute Rehab PT Goals Patient Stated Goal: did not state PT Goal Formulation: Patient unable to participate in goal setting Time For Goal Achievement: 07/02/15 Potential to Achieve Goals: Good    Frequency Min 3X/week           End of Session   Activity Tolerance: Other (comment) (pt self limiting) Patient left: in bed;with call bell/phone within reach;with bed alarm set Nurse Communication: Mobility status;Other (comment) (limited evaluation)    Functional Assessment Tool Used: assist level Functional Limitation: Mobility: Walking and moving around Mobility: Walking and Moving Around Current Status [phone]): At least 1 percent but less than 20 percent impaired, limited or restricted Mobility: Walking and Moving Around Goal Status [phone]): 0 percent impaired, limited or restricted    Time: 1059-1110 PT Time Calculation (min) (ACUTE ONLY): 11 min   Charges:   PT Evaluation $Initial PT Evaluation Tier I: 1 Procedure     PT G Codes:   PT G-Codes **NOT FOR INPATIENT CLASS** Functional Assessment Tool Used: assist level Functional Limitation: Mobility: Walking and moving around Mobility: Walking and Moving Around Current Status ([id number]): At least 1 percent but less than 20 percent impaired, limited or restricted Mobility: Walking and Moving Around Goal Status [phone]): 0 percent impaired, limited or restricted    Reyah Streeter B. [place], [place], DPT [phone]    06/18/2015, 11:28 AM

## 2015-06-18 NOTE — Progress Notes (Signed)
Discharge teaching and instructions reviewed with pt. VSS. Pt discharging home with girlfriend.

## 2015-06-18 NOTE — Discharge Summary (Signed)
Name: [name] MRN: [id number] DOB: Sep 04, 1931 81 y.o. PCP: Chip Boer, PA-C  Date of Admission: 06/17/2015 11:07 AM Date of Discharge: 06/18/2015 Attending Physician: No att. providers found  Discharge Diagnosis: 1.  Active Problems:   Hypertensive urgency  Discharge Medications:   Medication List    TAKE these medications        acetaminophen 325 MG tablet  Commonly known as:  TYLENOL  Take 2 tablets (650 mg total) by mouth every 4 (four) hours as needed for headache or mild pain.     albuterol 108 (90 BASE) MCG/ACT inhaler  Commonly known as:  PROVENTIL HFA;VENTOLIN HFA  Inhale 2 puffs into the lungs 2 (two) times daily as needed for wheezing or shortness of breath.     amLODipine 10 MG tablet  Commonly known as:  NORVASC  Take 1 tablet (10 mg total) by mouth daily.     aspirin EC 81 MG tablet  Take 1 tablet (81 mg total) by mouth daily.     atorvastatin 40 MG tablet  Commonly known as:  LIPITOR  TAKE 1 TABLET BY MOUTH AT BEDTIME FOR CHOLESTEROL     clopidogrel 75 MG tablet  Commonly known as:  PLAVIX  TAKE 1 TABLET BY MOUTH ONCE DAILY.     colestipol 1 G tablet  Commonly known as:  COLESTID  Take 1 g by mouth 2 (two) times daily as needed (cholesterol).     cyanocobalamin 1000 MCG tablet  Take 100 mcg by mouth daily.     cyclobenzaprine 5 MG tablet  Commonly known as:  FLEXERIL  Take 5 mg by mouth 3 (three) times daily as needed for muscle spasms (Patient taking by mouth one and half tablets three times daily).     doxazosin 2 MG tablet  Commonly known as:  CARDURA  Take 1 tablet (2 mg total) by mouth daily.     fluocinonide gel 0.05 %  Commonly known as:  LIDEX  Apply 1 application topically as needed (insect bites).     fluticasone 220 MCG/ACT inhaler  Commonly known as:  FLOVENT HFA  Inhale 1 puff into the lungs 2 (two) times daily as needed (shortness of breath).     furosemide 40 MG tablet  Commonly known as:  LASIX  Take 1 tablet  (40 mg total) by mouth daily as needed for edema.     HYDROcodone-acetaminophen 7.5-325 MG per tablet  Commonly known as:  NORCO  Take 1 tablet by mouth every 4 (four) hours as needed for moderate pain. FOR PAIN     isosorbide mononitrate 60 MG 24 hr tablet  Commonly known as:  IMDUR  TAKE 1 TABLET BY MOUTH ONCE DAILY.     metoprolol tartrate 25 MG tablet  Commonly known as:  LOPRESSOR  TAKE 1 TABLET BY MOUTH TWICE DAILY     nitroGLYCERIN 0.4 MG SL tablet  Commonly known as:  NITROSTAT  Place 1 tablet (0.4 mg total) under the tongue every 5 (five) minutes as needed for chest pain.     nystatin 100000 UNIT/ML suspension  Commonly known as:  MYCOSTATIN  Take 5 mLs (500,000 Units total) by mouth 4 (four) times daily.     pantoprazole 40 MG tablet  Commonly known as:  PROTONIX  TAKE 1 TABLET BY MOUTH TWICE DAILY FOR REFLUX.     potassium chloride 10 MEQ tablet  Commonly known as:  K-DUR  Take 1 tablet (10 mEq total) by mouth daily.  tamsulosin 0.4 MG Caps capsule  Commonly known as:  FLOMAX  Take 0.4 mg by mouth daily.     Vitamin D-3 5000 UNITS Tabs  Take 5,000 Units by mouth 2 (two) times daily.     ZETIA 10 MG tablet  Generic drug:  ezetimibe  TAKE (1/2) TABLET BY MOUTH ONCE DAILY.        Disposition and follow-up:   [name] was discharged from [facility] in Good condition.  At the hospital follow up visit please address:  1.  HTN: Is he compliant with his medication regimen? During this hospitalization, Amlodipine was increased from 5 mg QD to 10 mg QD. If BP continues to be elevated in the future, consider increasing dose of Imdur.   Chronic stable Angina: any episodes of chest pain?  BPH: It was noted that the patient is on both Doxazosin and Tamsulosin but did report feeling dizzy at home when going from supine to standing position. Tamsulosin can be continued. However, please consider stopping Doxazosin and instead starting him on  Finasteride.  It was also noted, patient's PSA was elevated in 12/2012. It was 5.73 at the time. Please consider checking it again.   2.  Labs / imaging needed at time of follow-up: BMP, PSA  3.  Pending labs/ test needing follow-up: None  Follow-up Appointments:     Follow-up Information    Follow up with CLAGGETT,ELIN, PA-C. Schedule an appointment as soon as possible for a visit in 1 week.   Specialty:  Family Medicine   Contact information:   [address] [place] [address] [phone]       Discharge Instructions: Discharge Instructions    Diet - low sodium heart healthy    Complete by:  As directed      Increase activity slowly    Complete by:  As directed            Consultations:    Procedures Performed:  Dg Chest 2 View  06/17/2015   CLINICAL DATA:  Shortness of breath. Dizziness. Asthma. Previous myocardial infarct.  EXAM: CHEST  2 VIEW  COMPARISON:  03/03/2014  FINDINGS: The heart size and mediastinal contours are within normal limits. Mild tortuosity of thoracic aorta is stable. Both lungs are clear. No evidence of pneumothorax or pleural effusion. Fusion hardware noted within the cervical and lumbar spine.  IMPRESSION: No active cardiopulmonary disease.   Electronically Signed   By: Earle Gell M.D.   On: 06/17/2015 12:47   Ct Head Wo Contrast  06/17/2015   CLINICAL DATA:  Confusion with dizziness and headaches for approximately 2 weeks.  EXAM: CT HEAD WITHOUT CONTRAST  TECHNIQUE: Contiguous axial images were obtained from the base of the skull through the vertex without intravenous contrast.  COMPARISON:  Brain MRI Mar 01, 2010  FINDINGS: There is mild diffuse atrophy. There is no intracranial mass, hemorrhage, extra-axial fluid collection, or midline shift. There is small vessel disease in the centra semiovale bilaterally, progressed since 2011. No acute infarct evident. Bony calvarium appears intact. Mastoid air cells are clear. There is mucosal thickening  in the right maxillary antrum.  IMPRESSION: Atrophy with periventricular small vessel disease. No intracranial mass, hemorrhage, or acute appearing infarct. Right maxillary sinus disease present.   Electronically Signed   By: Lowella Grip III M.D.   On: 06/17/2015 13:02    Admission HPI: [name] is a 81 year-old man with history of coronary artery disease status-post bare  metal stent placement in the right coronary artery in 2002, hypertension, and hyperlipidemia. While at his urology earlier this appointment this morning, his blood pressure was noted to be 204/100, however he doesn't take his medications on days when he goes to doctor's appointments. He explained that he doesn't want to eat or drink on those mornings out of fear he may be incontinent, so he holds his medications altogether. At his appointment, the urologist recommended he call his primary care doctor; when he called his PCP, he recommended going to emergency department, so he went. When we saw the patient, he endorsed a mild bifrontal headache that started this morning, he says he sometimes has mild chest pain and had some earlier today, and has felt slightly confused recently. He specifically denied change in vision, focal weakness, crushing chest pain, tearing back pain, abdominal pain, or shortness of breath. When we spoke to the son, he mentioned he's had some mild short-term memory loss over the last 5 days. He says he gets a little confused about names when he's telling stories but he can remember all the other details. The family chalked this up to him taking his Percocet, although he's been on this medicine for years. The son also said he sometimes feels dizzy when he stands but this has been a longstanding issue. Otherwise has been his normal self and hasn't complained about anything. He says he thinks his dad takes all of his medications regularly but he's not sure because he lives in an apartment behind his house.   In the  emergency department, the patient was hypertensive to 200/100, but his vitals were otherwise normal. A head CT was negative for bleed, ischemia, or mass, and only showed mild periventricular atrophy.   Hospital Course by problem list: Active Problems:   Hypertensive urgency   1. Hypertensive urgency: CXR and CT of head did not show any acute abnormality indicative of end organ damage. Patient's blood pressure started normalizing after re-starting his home anti-hypertensive regiment - Metoprolol 25 mg BID, Imdur 60 mg QD. Amlodipine was increased from 5 mg QD to 10 mg QD. On the day of discharge, his BP was 139/87. He is to follow up with PCP within 1 week.   Mild chest pain: Patient has a history of chronic stable angina. He did report having mild chest pain on the date of admission. Troponin x 2 negative. EKG did not show any acute ST or T wave changes. We continued his home meds Imdur and Amlodipine which should help with both hypertension and angina. On the day of discharge, patient denied having any CP, SOB, nausea, or diaphoresis.    Short-term memory loss: concerning for vascular dementia vs Alzheimer's dementia. This can be managed in the outpatient setting.   Discharge Vitals:   BP 139/87 mmHg  Pulse 73  Temp(Src) 98.7 F (37.1 C) (Oral)  Resp 18  Ht 5\' 8"  (1.727 m)  Wt 169 lb 11.2 oz (76.975 kg)  BMI 25.81 kg/m2  SpO2 98%  Discharge Labs:  Results for orders placed or performed during the hospital encounter of 06/17/15 (from the past 24 hour(s))  Troponin I     Status: None   Collection Time: 06/17/15 11:51 PM  Result Value Ref Range   Troponin I <0.03 <0.031 ng/mL    Signed: Shela Leff, MD 06/18/2015, 6:09 PM    Services Ordered on Discharge: None Equipment Ordered on Discharge: None

## 2015-06-18 NOTE — Progress Notes (Addendum)
Subjective: Patient seen and examined at bedside today. Endorses history of episodes of chest pain at rest in the past. However, denies CP, SOB, nausea, or diaphoresis at present. No other complaints.   Objective: Vital signs in last 24 hours: Filed Vitals:   06/17/15 2059 06/18/15 0500 06/18/15 0844 06/18/15 1426  BP: 149/85 155/74 171/71 139/87  Pulse:  58  73  Temp: 98.4 F (36.9 C) 98.4 F (36.9 C)  98.7 F (37.1 C)  TempSrc: Oral Oral  Oral  Resp: 18     Height:      Weight:  169 lb 11.2 oz (76.975 kg)    SpO2: 97% 99%  98%   Weight change:   Intake/Output Summary (Last 24 hours) at 06/18/15 1808 Last data filed at 06/18/15 1729  Gross per 24 hour  Intake    720 ml  Output   1120 ml  Net   -400 ml   Physical exam General: caucasian elderly man lying in bed HEENT: PERRL, EOMI, no scleral icterus Cardiac: RRR, no rubs, murmurs or gallops Pulm: clear to auscultation bilaterally, moving normal volumes of air Abd: soft, nontender, nondistended, BS present Ext: warm and well perfused, no pedal edema, pulses intact Neuro: alert and oriented X3, cranial nerves II-XII grossly intact  Lab Results: Basic Metabolic Panel:  Recent Labs Lab 06/17/15 1229  NA 139  K 3.5  CL 104  CO2 31  GLUCOSE 98  BUN 7  CREATININE 0.86  CALCIUM 9.1   Liver Function Tests:  Recent Labs Lab 06/17/15 1229  AST 21  ALT 13*  ALKPHOS 66  BILITOT 1.2  PROT 5.8*  ALBUMIN 3.5   CBC:  Recent Labs Lab 06/17/15 1229  WBC 5.9  NEUTROABS 3.6  HGB 12.8*  HCT 38.5*  MCV 90.2  PLT 218   Cardiac Enzymes:  Recent Labs Lab 06/17/15 2351  TROPONINI <0.03   Coagulation:  Recent Labs Lab 06/17/15 1229  LABPROT 13.9  INR 1.05   Urine Drug Screen: Drugs of Abuse     Component Value Date/Time   LABOPIA POSITIVE* 06/17/2015 1200   COCAINSCRNUR NONE DETECTED 06/17/2015 1200   LABBENZ NONE DETECTED 06/17/2015 1200   AMPHETMU NONE DETECTED 06/17/2015 1200   THCU NONE  DETECTED 06/17/2015 1200   LABBARB NONE DETECTED 06/17/2015 1200    Alcohol Level:  Recent Labs Lab 06/17/15 1230  ETH <5   Urinalysis:  Recent Labs Lab 06/17/15 1200  COLORURINE YELLOW  LABSPEC 1.023  PHURINE 5.5  GLUCOSEU NEGATIVE  HGBUR TRACE*  BILIRUBINUR NEGATIVE  KETONESUR NEGATIVE  PROTEINUR NEGATIVE  UROBILINOGEN 1.0  NITRITE NEGATIVE  LEUKOCYTESUR NEGATIVE    Micro Results: No results found for this or any previous visit (from the past 240 hour(s)). Studies/Results: Dg Chest 2 View  06/17/2015   CLINICAL DATA:  Shortness of breath. Dizziness. Asthma. Previous myocardial infarct.  EXAM: CHEST  2 VIEW  COMPARISON:  03/03/2014  FINDINGS: The heart size and mediastinal contours are within normal limits. Mild tortuosity of thoracic aorta is stable. Both lungs are clear. No evidence of pneumothorax or pleural effusion. Fusion hardware noted within the cervical and lumbar spine.  IMPRESSION: No active cardiopulmonary disease.   Electronically Signed   By: Earle Gell M.D.   On: 06/17/2015 12:47   Ct Head Wo Contrast  06/17/2015   CLINICAL DATA:  Confusion with dizziness and headaches for approximately 2 weeks.  EXAM: CT HEAD WITHOUT CONTRAST  TECHNIQUE: Contiguous axial images were obtained from the  base of the skull through the vertex without intravenous contrast.  COMPARISON:  Brain MRI Mar 01, 2010  FINDINGS: There is mild diffuse atrophy. There is no intracranial mass, hemorrhage, extra-axial fluid collection, or midline shift. There is small vessel disease in the centra semiovale bilaterally, progressed since 2011. No acute infarct evident. Bony calvarium appears intact. Mastoid air cells are clear. There is mucosal thickening in the right maxillary antrum.  IMPRESSION: Atrophy with periventricular small vessel disease. No intracranial mass, hemorrhage, or acute appearing infarct. Right maxillary sinus disease present.   Electronically Signed   By: Lowella Grip III M.D.    On: 06/17/2015 13:02   Medications: I have reviewed the patient's current medications. Scheduled Meds: . aspirin EC  81 mg Oral Daily  . atorvastatin  40 mg Oral q1800  . clopidogrel  75 mg Oral Daily  . doxazosin  2 mg Oral Daily  . enoxaparin (LOVENOX) injection  40 mg Subcutaneous Q24H  . isosorbide mononitrate  60 mg Oral Daily  . metoprolol tartrate  25 mg Oral BID  . pantoprazole  40 mg Oral Daily  . potassium chloride  10 mEq Oral Daily  . sodium chloride  3 mL Intravenous Q12H  . sodium chloride  3 mL Intravenous Q12H  . tamsulosin  0.4 mg Oral Daily   Continuous Infusions:  PRN Meds:.albuterol, HYDROcodone-acetaminophen, labetalol, nitroGLYCERIN, sodium chloride Assessment/Plan: Active Problems:   Hypertensive urgency  Hypertensive urgency: BP 139/87 today. Patient states he is doing well and denies any CP, SOB, nausea, or diaphoresis at present. He has no evidence of end organ damage and his blood pressure is normalizing after re-starting his home anti-hypertensive regiment and giving him PRN labetalol for pressures greater than 180/110.  -Discharge to home today.  -Home regimen includes Amlodipine 5 mg qd, Imdur 60 mg qd, and Metoprolol 25 mg bid. The following changes have been made to the home medication regimen and patient aware: Amlodipine dose increased to 10 mg qd Cont Imdur 60 mg qd and Metoprolol 25 mg bid.   It was noted that the patient was on both Doxazosin and Tamsulosin for BPH but did report feeling dizzy at home when going from supine to standing position. Patient already left the hospital, I tried calling on 06/18/15 but could not reach him. On  06/19/15 4:54 pm I called again to remind him to stop taking Doxazosin and instead just take Tamsulosin for now. However, it was not clear from our conversation whether the patient completely understood what I was saying and no caregiver was available whom I could have relayed the message to. Patient hung up on the phone.  I tried calling his PCP Dr. Fidela Juneau office on 06/18/15 but could not reach anyone over the phone. Called back on 06/19/15, was told by a staff member no physician was available and was asked to call back next week on Tuesday. I will call back on Tuesday (06/23/15) to speak to his PCP about stopping Doxazosin and continuing Tamsulosin. Also I would like to make a suggestion for his PCP to consider Finasteride in place of Doxazosin. It was also noted, patient's PSA was elevated in 12/2012. It was 5.73 at the time. Will make a suggestion to PCP to check PSA level again before starting Finasteride because it can falsely lower PSA level.   Mild chest pain:Likely not acute coronary syndrome; however, he does have a significant history of coronary artery disease. Troponin x2 negative. EKG did not show any acute ST or  T wave changes. Patient has a history of angina and currently on Imdur and sublingual nitroglycerin.   Short-term memory loss: concerning for vascular dementia versus progression versus Alzheimer's dementia -This can be managed in the outpatient setting -Physical therapy consult  Dispo: Disposition is deferred at this time, awaiting improvement of current medical problems.  Anticipated discharge in approximately 0 day(s).   The patient does have a current PCP (Elin Claggett, PA-C) and does need an [facility] hospital follow-up appointment after discharge.  The patient does not have transportation limitations that hinder transportation to clinic appointments.  .Services Needed at time of discharge: Y = Yes, Blank = No PT:   OT:   RN:   Equipment:   Other:       Shela Leff, MD 06/18/2015, 6:08 PM

## 2015-07-13 ENCOUNTER — Other Ambulatory Visit: Payer: Self-pay | Admitting: Internal Medicine

## 2015-07-30 ENCOUNTER — Ambulatory Visit (INDEPENDENT_AMBULATORY_CARE_PROVIDER_SITE_OTHER): Payer: Medicare Other | Admitting: Neurology

## 2015-07-30 ENCOUNTER — Encounter: Payer: Self-pay | Admitting: Neurology

## 2015-07-30 VITALS — BP 113/66 | HR 67 | Ht 68.0 in | Wt 173.0 lb

## 2015-07-30 DIAGNOSIS — R404 Transient alteration of awareness: Secondary | ICD-10-CM | POA: Diagnosis not present

## 2015-07-30 DIAGNOSIS — R269 Unspecified abnormalities of gait and mobility: Secondary | ICD-10-CM

## 2015-07-30 DIAGNOSIS — G3184 Mild cognitive impairment, so stated: Secondary | ICD-10-CM | POA: Diagnosis not present

## 2015-07-30 DIAGNOSIS — I679 Cerebrovascular disease, unspecified: Secondary | ICD-10-CM

## 2015-07-30 NOTE — Progress Notes (Signed)
PATIENT: [name] DOB: 06/19/1931  Chief Complaint  Patient presents with  . Memory Loss    MMSE 27/30 - 6 animals.  He is here with his son, [name] and his daughter, Helene Kelp.  Reports his memory has been worsening over the last few years.  He is easily confused and often forgets how to do familiar tasks, such as writing a check.  He has difficulty with word finding.     HISTORICAL  [name] is 81 years old right-handed male, accompanied by his son [name], and his daughter Helene Kelp, seen in refer by his primary care physician Dr.  [name] for evaluation of memory trouble, gait difficulty, passing out episodes  I have reviewed and summarized his medical record, he has past medical history of hypertension, hyperlipidemia, coronary artery disease, status post stent placement, multiple cervical, lumbar decompression surgery in the past, mild gait difficulty at baseline, using cane, he complains of constant spine pain from neck to his feet,  He had a year 11 years of education, worked at [employer] job, retired at age 78, lived alone since his wife passed away in 02/05/2000, still drives short distances to drugstore, grocery shopping, taking care of his household   He was admitted to hospital in early September 2016 because elevated blood pressure 200/100, his blood pressure medication was adjusted Metoprolol 25 mg BID, Imdur 60 mg QD. Amlodipine was increased from 5 mg QD to 10 mg QD. On the day of discharge, his BP was 139/87.   The evening he was discharged from the hospital, he was sitting at the table sorting out his medications, with out warning signs, he fell off the chair, turned to white, went limp, lost consciousness for a few minutes, paramedic was called, per family, vital signs, blood glucose was normal, he came around in few minutes, there was no clinical seizure noticed.  He was noted to have gradually onset memory loss, difficulty managing his medications, now his  daughter took care of his bills,  Laboratory evaluation in September 2016, showed normal W09 8:11, folic acid 13 point 5, normal CMP, TSH 1.26, normal CBC with hemoglobin of 13 point 3, creatinine level was 1.04 triglyceride was 62, cholesterol was 137, LDL was 68  He denies a family history of memory loss   I have personally reviewed CAT scan in August 2016, mild generalized atrophy, small vessel disease, no acute lesions  REVIEW OF SYSTEMS: Full 14 system review of systems performed and notable only for fatigue, palpitation, hearing loss, ringing ears, spinning sensation, blurred vision, shortness of breath, wheezing, constipation, easy bruising, easy bleeding, joint pain, joint swelling, aching muscles, allergy, memory loss, confusion, headaches, numbness, weakness, slurred speech, dizziness, passing out, tremor, decreased energy   ALLERGIES: Allergies  Allergen Reactions  . Budesonide-Formoterol Fumarate Swelling    Mouth and tongue swelling.  . Iohexol Shortness Of Breath and Itching    Pt was given 100cc of Omnipaque 300 followed by itching/ dyspnea.  . Simvastatin Other (See Comments)    REACTION: unknown  . Demerol [Meperidine] Other (See Comments)    REACTION: Hallucinations  . Ivp Dye [Iodinated Diagnostic Agents] Other (See Comments)    Iodine pt had a reaction when he had a CT DONE  . Meperidine Hcl Other (See Comments)    REACTION: Hallucinations  . Naproxen Swelling  . Pregabalin Rash  . Sulfonamide Derivatives Rash    HOME MEDICATIONS: Current Outpatient Prescriptions  Medication Sig Dispense Refill  .  acetaminophen (TYLENOL) 325 MG tablet Take 2 tablets (650 mg total) by mouth every 4 (four) hours as needed for headache or mild pain.    Marland Kitchen albuterol (PROVENTIL HFA;VENTOLIN HFA) 108 (90 BASE) MCG/ACT inhaler Inhale 2 puffs into the lungs 2 (two) times daily as needed for wheezing or shortness of breath.    Marland Kitchen amLODipine (NORVASC) 10 MG tablet Take 1 tablet (10 mg  total) by mouth daily. 30 tablet 2  . aspirin EC 81 MG tablet Take 1 tablet (81 mg total) by mouth daily.    Marland Kitchen atorvastatin (LIPITOR) 40 MG tablet TAKE 1 TABLET BY MOUTH AT BEDTIME FOR CHOLESTEROL 90 tablet 1  . Cholecalciferol (VITAMIN D-3) 5000 UNITS TABS Take 5,000 Units by mouth 2 (two) times daily.    . clopidogrel (PLAVIX) 75 MG tablet TAKE 1 TABLET BY MOUTH ONCE DAILY. 90 tablet 1  . colestipol (COLESTID) 1 G tablet Take 1 g by mouth 2 (two) times daily as needed (cholesterol).    . cyanocobalamin 1000 MCG tablet Take 100 mcg by mouth daily.    . cyclobenzaprine (FLEXERIL) 5 MG tablet Take 5 mg by mouth 3 (three) times daily as needed for muscle spasms (Patient taking by mouth one and half tablets three times daily).    Marland Kitchen doxazosin (CARDURA) 2 MG tablet Take 1 tablet (2 mg total) by mouth daily. 90 tablet 3  . fluocinonide gel (LIDEX) 3.38 % Apply 1 application topically as needed (insect bites).    . fluticasone (FLOVENT HFA) 220 MCG/ACT inhaler Inhale 1 puff into the lungs 2 (two) times daily as needed (shortness of breath).     . furosemide (LASIX) 40 MG tablet Take 1 tablet (40 mg total) by mouth daily as needed for edema. 30 tablet 5  . HYDROcodone-acetaminophen (NORCO) 7.5-325 MG per tablet Take 1 tablet by mouth every 4 (four) hours as needed for moderate pain. FOR PAIN    . isosorbide mononitrate (IMDUR) 60 MG 24 hr tablet TAKE 1 TABLET BY MOUTH ONCE DAILY. 30 tablet 11  . metoprolol tartrate (LOPRESSOR) 25 MG tablet TAKE 1 TABLET BY MOUTH TWICE DAILY 180 tablet 1  . nitroGLYCERIN (NITROSTAT) 0.4 MG SL tablet Place 1 tablet (0.4 mg total) under the tongue every 5 (five) minutes as needed for chest pain. 25 tablet 3  . nystatin (MYCOSTATIN) 100000 UNIT/ML suspension Take 5 mLs (500,000 Units total) by mouth 4 (four) times daily. 120 mL 0  . pantoprazole (PROTONIX) 40 MG tablet TAKE 1 TABLET BY MOUTH TWICE DAILY FOR REFLUX. 60 tablet 11  . potassium chloride (K-DUR) 10 MEQ tablet Take  1 tablet (10 mEq total) by mouth daily. 30 tablet 1  . tamsulosin (FLOMAX) 0.4 MG CAPS capsule Take 0.4 mg by mouth daily.     Marland Kitchen ZETIA 10 MG tablet TAKE (1/2) TABLET BY MOUTH ONCE DAILY. 45 tablet 1   No current facility-administered medications for this visit.    PAST MEDICAL HISTORY: Past Medical History  Diagnosis Date  . Hypertension   . Hyperlipidemia   . Chronic chest pain   . GERD (gastroesophageal reflux disease)     h/o esophageal spasm  . Asthma   . Coronary artery disease     a. s/p BMS to RCA in 2002; b. s/p cutting balloon POBA ;   c. cath 6/12: oDx 80% (treated with repeat cutting balloon POBA), mLAD 50% with 30-40% at Dx, CFX 30%, pRCA 25% with patent stents;  d.  Lex MV 4/14:  Low  Risk - EF 61%, inf scar with peri-infarct ischemia  . Myocardial infarction ([place]) 2001    x 1, confirned 1 possible  . Dyspnea     chronic  . History of blood transfusion     "related to OR"  . History of hiatal hernia   . Headache   . Arthritis     "hips, back" (06/17/2015)  . Chronic lower back pain   . Melanoma of lower back ([place]) late 1990's  . Memory loss     PAST SURGICAL HISTORY: Past Surgical History  Procedure Laterality Date  . Cervical disc surgery  multiple  . Back surgery  multiple  . Laminectomy    . Posterior laminectomy / decompression cervical spine    . Anterior cervical decomp/discectomy fusion    . Esophagogastroduodenoscopy  03/23/2012    Procedure: ESOPHAGOGASTRODUODENOSCOPY (EGD);  Surgeon: Winfield Cunas., MD;  Location: [facility];  Service: Endoscopy;  Laterality: N/A;  . Savory dilation  03/23/2012    Procedure: SAVORY DILATION;  Surgeon: Winfield Cunas., MD;  Location: [facility];  Service: Endoscopy;  Laterality: N/A;  . Esophagogastroduodenoscopy N/A 03/04/2014    Procedure: ESOPHAGOGASTRODUODENOSCOPY (EGD);  Surgeon: Arta Silence, MD;  Location: [facility] ENDOSCOPY;  Service: Endoscopy;  Laterality: N/A;  . Cataract extraction w/ intraocular lens   implant, bilateral Bilateral   . Colonoscopy with propofol N/A 04/17/2014    Procedure: COLONOSCOPY WITH PROPOFOL;  Surgeon: Winfield Cunas., MD;  Location: [facility] ENDOSCOPY;  Service: Endoscopy;  Laterality: N/A;  . Left heart catheterization with coronary angiogram N/A 01/13/2014    Procedure: LEFT HEART CATHETERIZATION WITH CORONARY ANGIOGRAM;  Surgeon: Blane Ohara, MD;  Location: [facility] CATH LAB;  Service: Cardiovascular;  Laterality: N/A;  . Tonsillectomy  1930's  . Cardiac catheterization      "I've had 17 caths" (06/17/2015)  . Coronary angioplasty with stent placement  x 2 stents     previous percutaneous intervention on the  RCA and the diagonal branch  . Melanoma excision  late 1990's    "lower back"    FAMILY HISTORY: Family History  Problem Relation Age of Onset  . Diabetes Father   . Heart disease Father   . Asthma Father   . Heart disease Mother     CABG hx age 16  . Colon cancer Son     hx   . Colitis Son     hx  . Crohn's disease Son   . Prostate cancer Paternal Grandfather     SOCIAL HISTORY:  Social History   Social History  . Marital Status: Widowed    Spouse Name: N/A  . Number of Children: 5  . Years of Education: 11   Occupational History  . retired     Clinical research associate   Social History Main Topics  . Smoking status: Never Smoker   . Smokeless tobacco: Never Used  . Alcohol Use: No  . Drug Use: No  . Sexual Activity: No   Other Topics Concern  . Not on file   Social History Narrative   Lives in [place]. Generaly active around the house and walks his dog without chest pain or SOB.   Right-handed.   Lives alone.   No caffeine use.     PHYSICAL EXAM   Filed Vitals:   07/30/15 1029  BP: 113/66  Pulse: 67  Height: 5\' 8"  (1.727 m)  Weight: 173 lb (78.472 kg)    Not recorded  Body mass index is 26.31 kg/(m^2).  PHYSICAL EXAMNIATION:  Gen: NAD, conversant, well nourised, obese, well groomed                       Cardiovascular: Regular rate rhythm, no peripheral edema, warm, nontender. Eyes: Conjunctivae clear without exudates or hemorrhage Neck: Supple, no carotid bruise. Pulmonary: Clear to auscultation bilaterally   NEUROLOGICAL EXAM:  MENTAL STATUS: Speech:    Speech is normal; fluent and spontaneous with normal comprehension.  Cognition:Mini-Mental Status Examination is 27 out of 30     Orientation to time, place and person     Recent and remote memory: He missed one out of 3 recalls     Attention span and concentration: Has difficulties spell world backwards     Normal Language, naming, repeating,spontaneous speech:     Fund of knowledge  CRANIAL NERVES: CN II: Visual fields are full to confrontation. Fundoscopic exam is normal with sharp discs and no vascular changes. Pupils are round equal and briskly reactive to light. CN III, IV, VI: extraocular movement are normal. No ptosis. CN V: Facial sensation is intact to pinprick in all 3 divisions bilaterally. Corneal responses are intact.  CN VII: Face is symmetric with normal eye closure and smile. CN VIII: Hearing is normal to rubbing fingers CN IX, X: Palate elevates symmetrically. Phonation is normal. CN XI: Head turning and shoulder shrug are intact CN XII: Tongue is midline with normal movements and no atrophy.  MOTOR: He has mild bilateral ankle dorsiflexion weakness   REFLEXES: Reflexes are 2+ and symmetric at the biceps, triceps, knees, and ankles. Plantar responses are flexor.  SENSOR Length dependent decreased to light touch, pinprick, and vibration sense are intact in fingers and toes.  COORDINATION: Rapid alternating movements and fine finger movements are intact. There is no dysmetria on finger-to-nose and heel-knee-shin.    GAIT/STANCE:  he need to push up to get up from seated position, cautious, mildly unsteady, mild bilateral foot drop   DIAGNOSTIC DATA (LABS, IMAGING, TESTING) - I reviewed patient records,  labs, notes, testing and imaging myself where available.   ASSESSMENT AND PLAN  [name] is a 81 y.o. male   Memory loss  Sudden loss of consciousness  Cardiac etiology versus possibility of seizure  EEG  MRI of the brain without contrast  Gait difficulty:  Multifactorial, due to pain, lumbar radiculopathy, possible cervical spondylitic myelopathy    Marcial Pacas, M.D. Ph.D.  [facility] [address], [place], [place] [address] Ph: [phone] Fax: [phone]  CC: [name], MD

## 2015-08-11 ENCOUNTER — Other Ambulatory Visit: Payer: Self-pay | Admitting: Internal Medicine

## 2015-08-21 ENCOUNTER — Ambulatory Visit
Admission: RE | Admit: 2015-08-21 | Discharge: 2015-08-21 | Disposition: A | Discharge status: Home / Self Care | Payer: Medicare Other | Source: Ambulatory Visit | Attending: Neurology | Admitting: Neurology

## 2015-08-21 DIAGNOSIS — R404 Transient alteration of awareness: Secondary | ICD-10-CM

## 2015-08-21 DIAGNOSIS — G3184 Mild cognitive impairment, so stated: Secondary | ICD-10-CM | POA: Diagnosis not present

## 2015-08-21 DIAGNOSIS — R269 Unspecified abnormalities of gait and mobility: Secondary | ICD-10-CM

## 2015-08-21 DIAGNOSIS — I679 Cerebrovascular disease, unspecified: Secondary | ICD-10-CM

## 2015-08-24 ENCOUNTER — Telehealth: Payer: Self-pay | Admitting: Neurology

## 2015-08-24 NOTE — Telephone Encounter (Signed)
Spoke to patient and his son, [name] (on HIPPA) - both are aware of results.

## 2015-08-24 NOTE — Telephone Encounter (Signed)
Please call patient, MRI of the brain showed mild atrophy, mild small vessel disease, age-related changes, will review findings in detail at his next follow-up visit  IMPRESSION: This noncontrasted MRI of the brain shows the following: 1. Mild to moderate cortical atrophy that has progressed slightly when compared to the MRI dated 03/01/2010. 2. White matter foci most consistent with chronic microvascular ischemic change. These have also progressed mildly when compared to 03/01/2010. 3. Chronic maxillary and ethmoid sinusitis.

## 2015-08-25 ENCOUNTER — Other Ambulatory Visit: Payer: Medicare Other

## 2015-09-02 ENCOUNTER — Ambulatory Visit: Payer: Medicare Other | Admitting: Neurology

## 2015-09-21 NOTE — Progress Notes (Signed)
Cardiology Office Note   Date:  09/22/2015   ID:  [name], DOB 02-27-1931, MRN [id number]  PCP:  [name], MD  Cardiologist:  Dr. Harrington Challenger   Follow up   History of Present Illness: [name] is a 81 y.o. male with a history of CAD s/p stent to RCA and cutting balloon angioplasty to the Dx, chronic chest pain, chronic dysphagia s/p dilations x2, HTN, HLD, and BPH who presents to clinic for follow up.   He was admitted in 11/2013 with chest pain. He ruled out for MI. Inpatient Myoview demonstrated no ischemia. EF was normal by echo. He saw Dr. Harrington Challenger in follow up. He continued to have exertional chest pain. He was set up for cardiac catheterization (01/14/14). This demonstrated patent stent to the RCA. Ostial diagonal demonstrated 70-75%. FFR was normal (0.95 at peak hyperemia). Medical therapy was recommended.  Admitted 5/17-5/20/15 for GI bleed. EGD was done and Shatzkis ring felt not to be playing a significant role in his dysphagia. No source of bleed identified. Felt to be due to diverticular origin.   Last seen by Dr. Harrington Challenger in 4/21016. He complained of dyspnea and she ordered a myoview, which returned normal. He also complained of dizziness and he was found to be orthostatic. She stopped amlodipine and cut cardura.   Seen in [facility] 06/17/15 with HA and evelated blood pressure in the setting on not taking his medications that AM. Head CT was negative for acute findings. He was admitted to [facility] 8/31-06/18/15 for hypertensive urgency. Amlodipine was increased from 5 mg QD to 10 mg QD to help treat HTN and chronic stable angina.  He also complained of continued dizziness. It was noted that he was on both Doxazosin and Tamsulosin. Tamsulosin can be continued. But MD felt that doxazosin should be discontinued and he should be started on Finasteride, but it does not appear that this was done.   The evening he was discharged from the hospital, he was sitting at the table sorting  out his medications, with out warning signs, he fell off the chair, turned to white, went limp, lost consciousness for a few minutes, paramedic was called, per family, vital signs, blood glucose was normal, he came around in few minutes, there was no clinical seizure noticed.  He followed up with neurology (07/30/15) due to syncope and memory loss and she ordered a MRI without contrast  Which showed mild to moderate cortical atrophy and microvascular changes that had progressed since previous.   Today he presents to clinic for follow up. He occasionally has chest pain ( ~ 2X a week). He had an episode on Thursday and Sunday. He had left sided chest pain that ran into his shoulder. It lasted 1/2 hour and was associated with SOB. It was relieved by 2 SL NTG. This occurred while sitting at rest. He continues to have dizziness but he is okay when he stands up real slow.   No LE edema, orthopnea or PND. No syncope. No blood in his stool or urine.    Past Medical History  Diagnosis Date  . Hypertension   . Hyperlipidemia   . Chronic chest pain   . GERD (gastroesophageal reflux disease)     h/o esophageal spasm  . Asthma   . Coronary artery disease     a. s/p BMS to RCA in 2002; b. s/p cutting balloon POBA ;   c. cath 6/12: oDx 80% (treated with repeat cutting balloon  POBA), mLAD 50% with 30-40% at Dx, CFX 30%, pRCA 25% with patent stents;  d.  Lex MV 4/14:  Low Risk - EF 61%, inf scar with peri-infarct ischemia  . Myocardial infarction ([place]) 2001    x 1, confirned 1 possible  . Dyspnea     chronic  . History of blood transfusion     "related to OR"  . History of hiatal hernia   . Headache   . Arthritis     "hips, back" (06/17/2015)  . Chronic lower back pain   . Melanoma of lower back ([place]) late 1990's  . Memory loss     Past Surgical History  Procedure Laterality Date  . Cervical disc surgery  multiple  . Back surgery  multiple  . Laminectomy    . Posterior laminectomy / decompression  cervical spine    . Anterior cervical decomp/discectomy fusion    . Esophagogastroduodenoscopy  03/23/2012    Procedure: ESOPHAGOGASTRODUODENOSCOPY (EGD);  Surgeon: Winfield Cunas., MD;  Location: [facility];  Service: Endoscopy;  Laterality: N/A;  . Savory dilation  03/23/2012    Procedure: SAVORY DILATION;  Surgeon: Winfield Cunas., MD;  Location: [facility] ENDOSCOPY;  Service: Endoscopy;  Laterality: N/A;  . Esophagogastroduodenoscopy N/A 03/04/2014    Procedure: ESOPHAGOGASTRODUODENOSCOPY (EGD);  Surgeon: Arta Silence, MD;  Location: [facility] ENDOSCOPY;  Service: Endoscopy;  Laterality: N/A;  . Cataract extraction w/ intraocular lens  implant, bilateral Bilateral   . Colonoscopy with propofol N/A 04/17/2014    Procedure: COLONOSCOPY WITH PROPOFOL;  Surgeon: Winfield Cunas., MD;  Location: [facility] ENDOSCOPY;  Service: Endoscopy;  Laterality: N/A;  . Left heart catheterization with coronary angiogram N/A 01/13/2014    Procedure: LEFT HEART CATHETERIZATION WITH CORONARY ANGIOGRAM;  Surgeon: Blane Ohara, MD;  Location: [facility] CATH LAB;  Service: Cardiovascular;  Laterality: N/A;  . Tonsillectomy  1930's  . Cardiac catheterization      "I've had 17 caths" (06/17/2015)  . Coronary angioplasty with stent placement  x 2 stents     previous percutaneous intervention on the  RCA and the diagonal branch  . Melanoma excision  late 1990's    "lower back"     Current Outpatient Prescriptions  Medication Sig Dispense Refill  . acetaminophen (TYLENOL) 325 MG tablet Take 2 tablets (650 mg total) by mouth every 4 (four) hours as needed for headache or mild pain.    Marland Kitchen amLODipine (NORVASC) 10 MG tablet Take 1 tablet (10 mg total) by mouth daily. 30 tablet 2  . aspirin EC 81 MG tablet Take 1 tablet (81 mg total) by mouth daily.    Marland Kitchen atorvastatin (LIPITOR) 40 MG tablet TAKE 1 TABLET BY MOUTH AT BEDTIME FOR CHOLESTEROL 90 tablet 1  . clopidogrel (PLAVIX) 75 MG tablet TAKE 1 TABLET BY MOUTH ONCE DAILY. 90 tablet 1    . cyclobenzaprine (FLEXERIL) 5 MG tablet Take 5 mg by mouth 3 (three) times daily as needed for muscle spasms (Patient taking by mouth one and half tablets three times daily).    Marland Kitchen doxazosin (CARDURA) 2 MG tablet TAKE 1 TABLET BY MOUTH ONCE DAILY. 30 tablet 5  . furosemide (LASIX) 40 MG tablet Take 1 tablet (40 mg total) by mouth daily as needed for edema. 30 tablet 5  . HYDROcodone-acetaminophen (NORCO) 7.5-325 MG per tablet Take 1 tablet by mouth every 4 (four) hours as needed for moderate pain. FOR PAIN    . isosorbide mononitrate (IMDUR) 60 MG 24 hr tablet  TAKE 1 TABLET BY MOUTH ONCE DAILY. 30 tablet 11  . metoprolol tartrate (LOPRESSOR) 25 MG tablet TAKE 1 TABLET BY MOUTH TWICE DAILY 180 tablet 1  . nitroGLYCERIN (NITROSTAT) 0.4 MG SL tablet Place 0.4 mg under the tongue every 5 (five) minutes as needed for chest pain (x 3 pills daily).    . pantoprazole (PROTONIX) 40 MG tablet TAKE 1 TABLET BY MOUTH TWICE DAILY FOR REFLUX. 60 tablet 11  . tamsulosin (FLOMAX) 0.4 MG CAPS capsule Take 0.4 mg by mouth daily.     Marland Kitchen ZETIA 10 MG tablet TAKE (1/2) TABLET BY MOUTH ONCE DAILY. 45 tablet 1   No current facility-administered medications for this visit.    Allergies:   Budesonide-formoterol fumarate; Iohexol; Simvastatin; Demerol; Ivp dye; Meperidine hcl; Naproxen; Pregabalin; and Sulfonamide derivatives    Social History:  The patient  reports that he has never smoked. He has never used smokeless tobacco. He reports that he does not drink alcohol or use illicit drugs.   Family History:  The patient's family history includes Asthma in his father; Colitis in his son; Colon cancer in his son; Crohn's disease in his son; Diabetes in his father; Heart disease in his father and mother; Prostate cancer in his paternal grandfather.    ROS:  Please see the history of present illness.   Otherwise, review of systems are positive for NONE.   All other systems are reviewed and negative.    PHYSICAL  EXAM: VS:  BP 116/68 mmHg  Pulse 72  Ht 5\' 8"  (1.727 m)  Wt 170 lb (77.111 kg)  BMI 25.85 kg/m2 , BMI Body mass index is 25.85 kg/(m^2). GEN: Well nourished, well developed, in no acute distress. Elderly  HEENT: normal Neck: no JVD, carotid bruits, or masses Cardiac: RRR; no murmurs, rubs, or gallops,no edema  Respiratory:  clear to auscultation bilaterally, normal work of breathing GI: soft, nontender, nondistended, + BS MS: no deformity or atrophy Skin: warm and dry, no rash Neuro:  Strength and sensation are intact Psych: euthymic mood, full affect   EKG:  EKG is ordered today. The ekg ordered today demonstrates HR 72 NSR    Recent Labs: 06/17/2015: ALT 13*; BUN 7; Creatinine, Ser 0.86; Hemoglobin 12.8*; Platelets 218; Potassium 3.5; Sodium 139    Lipid Panel    Component Value Date/Time   CHOL 123 01/19/2015 1315   TRIG 71.0 01/19/2015 1315   HDL 45.60 01/19/2015 1315   CHOLHDL 3 01/19/2015 1315   VLDL 14.2 01/19/2015 1315   LDLCALC 63 01/19/2015 1315      Wt Readings from Last 3 Encounters:  09/22/15 170 lb (77.111 kg)  07/30/15 173 lb (78.472 kg)  06/18/15 169 lb 11.2 oz (76.975 kg)      Other studies Reviewed: Additional studies/ records that were reviewed today include: LHC, ECHO Review of the above records demonstrates:  - LHC (01/13/14): oD1 70-75, LAD 40-50, mRCA stent patent with 30-40 ISR. FFR of oDx normal. Med Rx. - Echo (12/2013): EF 55-60%, mild LAE,  - Nuclear (01/2015): LV Ejection Fraction: 50%. LV Wall Motion: Normal Wall Motion or Mildly decreased EF 50% no discrete RWMA   ASSESSMENT AND PLAN:  [name] is a 81 y.o. male with a history of CAD s/p stent to RCA and cutting balloon angioplasty to the Dx, chronic chest pain, chronic dysphagia s/p dilations x2, HTN, HLD, and BPH who presents to clinic for follow up.   CAD/chronic chest pain: As noted, most recent cardiac  catheterization demonstrated patent stent and nonobstructive  disease in the diagonal (12/2013). Negative myoview in 01/2015. -- Continued medical therapy has been recommended. Continue amlodipine, aspirin, statin, Plavix. He has chronic chest pain. I will increase imdur from 60mg  po qd to 90mg . If he continues to have chest pain we can try Ranexa  Hypertension: BP 116/68 today. Controlled.  Hyperlipidemia: Continue statin.  GERD: Continue PPI.   Dizziness- think this is caused in large part by his alpha blocker. I will decrease Cardura from 2mg  to 1 mg   HLD- continue statin   Current medicines are reviewed at length with the patient today.  The patient does not have concerns regarding medicines.  The following changes have been made: Decrease Cardura from 2mg  to 1 mg and increase Imdur from 60mg  to 90 mg.   Labs/ tests ordered today include:   Orders Placed This Encounter  Procedures  . EKG 12-Lead     Disposition:   FU with Dr Harrington Challenger in 3 months.   Renea Ee  09/22/2015 1:24 PM    [facility] [address], [place], [place]  [address] Phone: [phone]; Fax: [phone]

## 2015-09-22 ENCOUNTER — Ambulatory Visit (INDEPENDENT_AMBULATORY_CARE_PROVIDER_SITE_OTHER): Payer: Medicare Other | Admitting: Physician Assistant

## 2015-09-22 ENCOUNTER — Encounter: Payer: Self-pay | Admitting: Physician Assistant

## 2015-09-22 VITALS — BP 116/68 | HR 72 | Ht 68.0 in | Wt 170.0 lb

## 2015-09-22 DIAGNOSIS — R079 Chest pain, unspecified: Secondary | ICD-10-CM | POA: Diagnosis not present

## 2015-09-22 MED ORDER — DOXAZOSIN MESYLATE 1 MG PO TABS: 1.0000 mg | ORAL_TABLET | Freq: Every day | ORAL | Status: DC

## 2015-09-22 MED ORDER — ISOSORBIDE MONONITRATE ER 60 MG PO TB24: ORAL_TABLET | ORAL | Status: DC

## 2015-09-22 NOTE — Patient Instructions (Addendum)
Medication Instructions:  Your physician has recommended you make the following change in your medication:  1.  DECREASE the Doxazosin  to 1mg  taking 1 tablet daily  2.  INCREASE the Imdur to 90 mg taking 1 tablet daily   Labwork: None ordered  Testing/Procedures: None ordered  Follow-Up: Your physician recommends that you schedule a follow-up appointment in:   [place] DR. ROSS (OR 1ST AVAILABLE)   Any Other Special Instructions Will Be Listed Below (If Applicable).   If you need a refill on your cardiac medications before your next appointment, please call your pharmacy.

## 2015-09-28 ENCOUNTER — Other Ambulatory Visit: Payer: Medicare Other

## 2015-09-30 ENCOUNTER — Ambulatory Visit: Payer: Medicare Other | Admitting: Neurology

## 2015-11-06 ENCOUNTER — Other Ambulatory Visit: Payer: Self-pay | Admitting: Internal Medicine

## 2015-11-14 ENCOUNTER — Inpatient Hospital Stay (HOSPITAL_COMMUNITY)
Admission: EM | Admit: 2015-11-14 | Discharge: 2015-11-15 | DRG: 303 | Disposition: A | Discharge status: Home / Self Care | Payer: Medicare Other | Attending: Cardiology | Admitting: Cardiology

## 2015-11-14 ENCOUNTER — Emergency Department (HOSPITAL_COMMUNITY): Payer: Medicare Other

## 2015-11-14 ENCOUNTER — Encounter (HOSPITAL_COMMUNITY): Payer: Self-pay | Admitting: Emergency Medicine

## 2015-11-14 DIAGNOSIS — I25110 Atherosclerotic heart disease of native coronary artery with unstable angina pectoris: Secondary | ICD-10-CM | POA: Diagnosis present

## 2015-11-14 DIAGNOSIS — Z955 Presence of coronary angioplasty implant and graft: Secondary | ICD-10-CM

## 2015-11-14 DIAGNOSIS — M13852 Other specified arthritis, left hip: Secondary | ICD-10-CM | POA: Diagnosis present

## 2015-11-14 DIAGNOSIS — I951 Orthostatic hypotension: Secondary | ICD-10-CM | POA: Diagnosis present

## 2015-11-14 DIAGNOSIS — Z882 Allergy status to sulfonamides status: Secondary | ICD-10-CM | POA: Diagnosis not present

## 2015-11-14 DIAGNOSIS — Z7902 Long term (current) use of antithrombotics/antiplatelets: Secondary | ICD-10-CM

## 2015-11-14 DIAGNOSIS — I10 Essential (primary) hypertension: Secondary | ICD-10-CM | POA: Diagnosis present

## 2015-11-14 DIAGNOSIS — I119 Hypertensive heart disease without heart failure: Secondary | ICD-10-CM | POA: Diagnosis not present

## 2015-11-14 DIAGNOSIS — Z8546 Personal history of malignant neoplasm of prostate: Secondary | ICD-10-CM

## 2015-11-14 DIAGNOSIS — E785 Hyperlipidemia, unspecified: Secondary | ICD-10-CM | POA: Diagnosis present

## 2015-11-14 DIAGNOSIS — Z79899 Other long term (current) drug therapy: Secondary | ICD-10-CM

## 2015-11-14 DIAGNOSIS — G8929 Other chronic pain: Secondary | ICD-10-CM | POA: Diagnosis present

## 2015-11-14 DIAGNOSIS — T446X5A Adverse effect of alpha-adrenoreceptor antagonists, initial encounter: Secondary | ICD-10-CM | POA: Diagnosis present

## 2015-11-14 DIAGNOSIS — N400 Enlarged prostate without lower urinary tract symptoms: Secondary | ICD-10-CM | POA: Diagnosis present

## 2015-11-14 DIAGNOSIS — I200 Unstable angina: Secondary | ICD-10-CM | POA: Diagnosis present

## 2015-11-14 DIAGNOSIS — K219 Gastro-esophageal reflux disease without esophagitis: Secondary | ICD-10-CM | POA: Diagnosis present

## 2015-11-14 DIAGNOSIS — I209 Angina pectoris, unspecified: Secondary | ICD-10-CM | POA: Diagnosis not present

## 2015-11-14 DIAGNOSIS — Z85820 Personal history of malignant melanoma of skin: Secondary | ICD-10-CM

## 2015-11-14 DIAGNOSIS — Z888 Allergy status to other drugs, medicaments and biological substances status: Secondary | ICD-10-CM | POA: Diagnosis not present

## 2015-11-14 DIAGNOSIS — Z981 Arthrodesis status: Secondary | ICD-10-CM

## 2015-11-14 DIAGNOSIS — I252 Old myocardial infarction: Secondary | ICD-10-CM

## 2015-11-14 DIAGNOSIS — R072 Precordial pain: Secondary | ICD-10-CM | POA: Diagnosis not present

## 2015-11-14 DIAGNOSIS — Z7982 Long term (current) use of aspirin: Secondary | ICD-10-CM

## 2015-11-14 DIAGNOSIS — M545 Low back pain: Secondary | ICD-10-CM | POA: Diagnosis present

## 2015-11-14 DIAGNOSIS — J45909 Unspecified asthma, uncomplicated: Secondary | ICD-10-CM | POA: Diagnosis present

## 2015-11-14 DIAGNOSIS — I2510 Atherosclerotic heart disease of native coronary artery without angina pectoris: Secondary | ICD-10-CM | POA: Diagnosis not present

## 2015-11-14 DIAGNOSIS — I2583 Coronary atherosclerosis due to lipid rich plaque: Secondary | ICD-10-CM | POA: Diagnosis present

## 2015-11-14 DIAGNOSIS — R079 Chest pain, unspecified: Secondary | ICD-10-CM | POA: Diagnosis present

## 2015-11-14 DIAGNOSIS — Z91041 Radiographic dye allergy status: Secondary | ICD-10-CM

## 2015-11-14 DIAGNOSIS — M13851 Other specified arthritis, right hip: Secondary | ICD-10-CM | POA: Diagnosis present

## 2015-11-14 DIAGNOSIS — Z885 Allergy status to narcotic agent status: Secondary | ICD-10-CM

## 2015-11-14 DIAGNOSIS — M1388 Other specified arthritis, other site: Secondary | ICD-10-CM | POA: Diagnosis present

## 2015-11-14 LAB — BASIC METABOLIC PANEL
ANION GAP: 10 (ref 5–15)
BUN: 6 mg/dL (ref 6–20)
CALCIUM: 9.3 mg/dL (ref 8.9–10.3)
CO2: 24 mmol/L (ref 22–32)
Chloride: 106 mmol/L (ref 101–111)
Creatinine, Ser: 0.98 mg/dL (ref 0.61–1.24)
GFR calc non Af Amer: >60 mL/min (ref >60)
Glucose, Bld: 120 mg/dL — ABNORMAL HIGH (ref 65–99)
Potassium: 4.1 mmol/L (ref 3.5–5.1)
Sodium: 140 mmol/L (ref 135–145)

## 2015-11-14 LAB — I-STAT TROPONIN, ED: TROPONIN I, POC: 0 ng/mL (ref 0.00–0.08)

## 2015-11-14 LAB — CBC
HCT: 36.8 % — ABNORMAL LOW (ref 39.0–52.0)
HEMOGLOBIN: 12 g/dL — AB (ref 13.0–17.0)
MCH: 29.4 pg (ref 26.0–34.0)
MCHC: 32.6 g/dL (ref 30.0–36.0)
MCV: 90.2 fL (ref 78.0–100.0)
Platelets: 220 10*3/uL (ref 150–400)
RBC: 4.08 MIL/uL — AB (ref 4.22–5.81)
RDW: 13.8 % (ref 11.5–15.5)
WBC: 6 10*3/uL (ref 4.0–10.5)

## 2015-11-14 MED ORDER — ENOXAPARIN SODIUM 40 MG/0.4ML ~~LOC~~ SOLN: 40.0000 mg | SUBCUTANEOUS | Status: DC

## 2015-11-14 MED ORDER — METOPROLOL TARTRATE 25 MG PO TABS
25.0000 mg | ORAL_TABLET | Freq: Two times a day (BID) | ORAL | Status: DC
  Administered 2015-11-15: 25 mg via ORAL
  Filled 2015-11-14: qty 1

## 2015-11-14 MED ORDER — ATORVASTATIN CALCIUM 40 MG PO TABS: 40.0000 mg | ORAL_TABLET | Freq: Every day | ORAL | Status: DC

## 2015-11-14 MED ORDER — ASPIRIN 81 MG PO CHEW
324.0000 mg | CHEWABLE_TABLET | Freq: Once | ORAL | Status: AC
  Administered 2015-11-14: 324 mg via ORAL
  Filled 2015-11-14: qty 4

## 2015-11-14 MED ORDER — ASPIRIN EC 81 MG PO TBEC
81.0000 mg | DELAYED_RELEASE_TABLET | Freq: Every day | ORAL | Status: DC
  Administered 2015-11-15: 81 mg via ORAL
  Filled 2015-11-14: qty 1

## 2015-11-14 MED ORDER — CYCLOBENZAPRINE HCL 5 MG PO TABS
7.5000 mg | ORAL_TABLET | Freq: Three times a day (TID) | ORAL | Status: DC
  Administered 2015-11-14 – 2015-11-15 (×3): 7.5 mg via ORAL
  Filled 2015-11-14 (×6): qty 1.5

## 2015-11-14 MED ORDER — FUROSEMIDE 40 MG PO TABS: 40.0000 mg | ORAL_TABLET | Freq: Every day | ORAL | Status: DC | PRN

## 2015-11-14 MED ORDER — CLOPIDOGREL BISULFATE 75 MG PO TABS
75.0000 mg | ORAL_TABLET | Freq: Once | ORAL | Status: AC
  Administered 2015-11-14: 75 mg via ORAL
  Filled 2015-11-14: qty 1

## 2015-11-14 MED ORDER — ACETAMINOPHEN 325 MG PO TABS: 650.0000 mg | ORAL_TABLET | ORAL | Status: DC | PRN

## 2015-11-14 MED ORDER — EZETIMIBE 10 MG PO TABS
10.0000 mg | ORAL_TABLET | Freq: Every day | ORAL | Status: DC
  Administered 2015-11-15: 10 mg via ORAL
  Filled 2015-11-14: qty 1

## 2015-11-14 MED ORDER — AMLODIPINE BESYLATE 10 MG PO TABS
10.0000 mg | ORAL_TABLET | Freq: Every day | ORAL | Status: DC
  Administered 2015-11-15: 10 mg via ORAL
  Filled 2015-11-14: qty 1

## 2015-11-14 MED ORDER — TAMSULOSIN HCL 0.4 MG PO CAPS
0.4000 mg | ORAL_CAPSULE | Freq: Every day | ORAL | Status: DC
  Filled 2015-11-14: qty 1

## 2015-11-14 MED ORDER — DOXAZOSIN MESYLATE 1 MG PO TABS
1.0000 mg | ORAL_TABLET | Freq: Every day | ORAL | Status: DC
  Administered 2015-11-15: 1 mg via ORAL
  Filled 2015-11-14: qty 1

## 2015-11-14 MED ORDER — HYDROCODONE-ACETAMINOPHEN 7.5-325 MG PO TABS: 1.0000 | ORAL_TABLET | ORAL | Status: DC | PRN

## 2015-11-14 MED ORDER — CLOPIDOGREL BISULFATE 75 MG PO TABS
75.0000 mg | ORAL_TABLET | Freq: Every day | ORAL | Status: DC
  Administered 2015-11-15: 75 mg via ORAL
  Filled 2015-11-14: qty 1

## 2015-11-14 MED ORDER — NITROGLYCERIN 0.4 MG SL SUBL: 0.4000 mg | SUBLINGUAL_TABLET | SUBLINGUAL | Status: DC | PRN

## 2015-11-14 NOTE — ED Notes (Signed)
Pt c/o intermittent left sided chest pain x 2 weeks. Pt has tried nitroglycerin with relief. Pt last did 3 doses this morning. Pt also reports no feeling in his fingers for 3-4 months.

## 2015-11-14 NOTE — ED Provider Notes (Signed)
CSN: [id number]     Arrival date & time 11/14/15  1119 History   First MD Initiated Contact with Patient 11/14/15 1134     Chief Complaint  Patient presents with  . Chest Pain     (Consider location/radiation/quality/duration/timing/severity/associated sxs/prior Treatment) HPI Complaints of left anterior chest pain onset upon awakening 8 AM today. He treated himself with 3 sublingual nitroglycerin with complete relief. Pain is nonradiating. Associated symptoms include nausea and shortness of breath and nausea. Pain feels like "heart pain" he's had in the past presently asymptomatic. He reports that he last used several nitroglycerin one week ago. Prior to last week he needed some lingual nitroglycerin several months prior Past Medical History  Diagnosis Date  . Hypertension   . Hyperlipidemia   . Chronic chest pain   . GERD (gastroesophageal reflux disease)     h/o esophageal spasm  . Asthma   . Coronary artery disease     a. s/p BMS to RCA in 2002; b. s/p cutting balloon POBA ;   c. cath 6/12: oDx 80% (treated with repeat cutting balloon POBA), mLAD 50% with 30-40% at Dx, CFX 30%, pRCA 25% with patent stents;  d.  Lex MV 4/14:  Low Risk - EF 61%, inf scar with peri-infarct ischemia  . Myocardial infarction ([place]) 2001    x 1, confirned 1 possible  . Dyspnea     chronic  . History of blood transfusion     "related to OR"  . History of hiatal hernia   . Headache   . Arthritis     "hips, back" (06/17/2015)  . Chronic lower back pain   . Melanoma of lower back ([place]) late 1990's  . Memory loss    Past Surgical History  Procedure Laterality Date  . Cervical disc surgery  multiple  . Back surgery  multiple  . Laminectomy    . Posterior laminectomy / decompression cervical spine    . Anterior cervical decomp/discectomy fusion    . Esophagogastroduodenoscopy  03/23/2012    Procedure: ESOPHAGOGASTRODUODENOSCOPY (EGD);  Surgeon: Winfield Cunas., MD;  Location: [facility];   Service: Endoscopy;  Laterality: N/A;  . Savory dilation  03/23/2012    Procedure: SAVORY DILATION;  Surgeon: Winfield Cunas., MD;  Location: [facility];  Service: Endoscopy;  Laterality: N/A;  . Esophagogastroduodenoscopy N/A 03/04/2014    Procedure: ESOPHAGOGASTRODUODENOSCOPY (EGD);  Surgeon: Arta Silence, MD;  Location: [facility] ENDOSCOPY;  Service: Endoscopy;  Laterality: N/A;  . Cataract extraction w/ intraocular lens  implant, bilateral Bilateral   . Colonoscopy with propofol N/A 04/17/2014    Procedure: COLONOSCOPY WITH PROPOFOL;  Surgeon: Winfield Cunas., MD;  Location: [facility] ENDOSCOPY;  Service: Endoscopy;  Laterality: N/A;  . Left heart catheterization with coronary angiogram N/A 01/13/2014    Procedure: LEFT HEART CATHETERIZATION WITH CORONARY ANGIOGRAM;  Surgeon: Blane Ohara, MD;  Location: [facility] CATH LAB;  Service: Cardiovascular;  Laterality: N/A;  . Tonsillectomy  1930's  . Cardiac catheterization      "I've had 17 caths" (06/17/2015)  . Coronary angioplasty with stent placement  x 2 stents     previous percutaneous intervention on the  RCA and the diagonal branch  . Melanoma excision  late 1990's    "lower back"   Family History  Problem Relation Age of Onset  . Diabetes Father   . Heart disease Father   . Asthma Father   . Heart disease Mother     CABG hx age  24  . Colon cancer Son     hx   . Colitis Son     hx  . Crohn's disease Son   . Prostate cancer Paternal Grandfather    Social History  Substance Use Topics  . Smoking status: Never Smoker   . Smokeless tobacco: Never Used  . Alcohol Use: No    Review of Systems  Respiratory: Positive for shortness of breath.   Cardiovascular: Positive for chest pain.  Gastrointestinal: Positive for nausea.  All other systems reviewed and are negative.     Allergies  Budesonide-formoterol fumarate; Iohexol; Simvastatin; Demerol; Ivp dye; Meperidine hcl; Tape; Naproxen; Pregabalin; and Sulfonamide  derivatives  Home Medications   Prior to Admission medications   Medication Sig Start Date End Date Taking? Authorizing Provider  acetaminophen (TYLENOL) 325 MG tablet Take 2 tablets (650 mg total) by mouth every 4 (four) hours as needed for headache or mild pain. 12/16/13   Erlene Quan, PA-C  amLODipine (NORVASC) 10 MG tablet Take 1 tablet (10 mg total) by mouth daily. 06/18/15   Shela Leff, MD  aspirin EC 81 MG tablet Take 1 tablet (81 mg total) by mouth daily. 03/08/14   Maryann Mikhail, DO  atorvastatin (LIPITOR) 40 MG tablet TAKE 1 TABLET BY MOUTH AT BEDTIME FOR CHOLESTEROL 08/12/15   Fay Records, MD  clopidogrel (PLAVIX) 75 MG tablet TAKE 1 TABLET BY MOUTH ONCE DAILY. 08/12/15   Fay Records, MD  cyclobenzaprine (FLEXERIL) 5 MG tablet Take 5 mg by mouth 3 (three) times daily as needed for muscle spasms (Patient taking by mouth one and half tablets three times daily).    Historical Provider, MD  doxazosin (CARDURA) 1 MG tablet Take 1 tablet (1 mg total) by mouth daily. 09/22/15   Eileen Stanford, PA-C  doxazosin (CARDURA) 2 MG tablet Take 2 mg by mouth daily.  08/11/15   Historical Provider, MD  fluocinonide gel (LIDEX) [id number] % Apply 1 application topically 2 (two) times daily.  11/12/15   Historical Provider, MD  furosemide (LASIX) 40 MG tablet Take 1 tablet (40 mg total) by mouth daily as needed for edema. 01/30/14   Liliane Shi, PA-C  HYDROcodone-acetaminophen (NORCO) 7.5-325 MG per tablet Take 1 tablet by mouth every 4 (four) hours as needed for moderate pain. FOR PAIN 06/05/14   Historical Provider, MD  isosorbide mononitrate (IMDUR) 60 MG 24 hr tablet TAKE  1 1/2 TABLET BY MOUTH DAILY 09/22/15   Eileen Stanford, PA-C  metoprolol tartrate (LOPRESSOR) 25 MG tablet TAKE 1 TABLET BY MOUTH TWICE DAILY 08/12/15   Fay Records, MD  nitroGLYCERIN (NITROSTAT) 0.4 MG SL tablet Place 0.4 mg under the tongue every 5 (five) minutes as needed for chest pain (x 3 pills daily).    Historical  Provider, MD  pantoprazole (PROTONIX) 40 MG tablet TAKE 1 TABLET BY MOUTH TWICE DAILY FOR REFLUX. 03/13/15   Fay Records, MD  tamsulosin (FLOMAX) 0.4 MG CAPS capsule Take 0.4 mg by mouth daily.  01/13/15   Historical Provider, MD  ZETIA 10 MG tablet TAKE (1/2) TABLET BY MOUTH ONCE DAILY. 11/06/15   Fay Records, MD   BP 147/95 mmHg  Pulse 83  Temp(Src) 98.1 F (36.7 C) (Oral)  Resp 20  Ht 5\' 10"  (1.778 m)  Wt 171 lb (77.565 kg)  BMI 24.54 kg/m2  SpO2 98% Physical Exam  Constitutional: He appears well-developed and well-nourished.  HENT:  Head: Normocephalic and atraumatic.  Eyes: Conjunctivae  are normal. Pupils are equal, round, and reactive to light.  Neck: Neck supple. No tracheal deviation present. No thyromegaly present.  Cardiovascular: Normal rate and regular rhythm.   No murmur heard. Pulmonary/Chest: Effort normal and breath sounds normal.  Abdominal: Soft. Bowel sounds are normal. He exhibits no distension. There is no tenderness.  Musculoskeletal: Normal range of motion. He exhibits no edema or tenderness.  Neurological: He is alert. Coordination normal.  Skin: Skin is warm and dry. No rash noted.  Psychiatric: He has a normal mood and affect.  Nursing note and vitals reviewed.   ED Course  Procedures (including critical care time) Labs Review Labs Reviewed  BASIC METABOLIC PANEL  [place], ED    Imaging Review No results found. I have personally reviewed and evaluated these images and lab results as part of my medical decision-making.   EKG Interpretation   Date/Time:  Saturday November 14 2015 11:24:02 EST Ventricular Rate:  89 PR Interval:  154 QRS Duration: 86 QT Interval:  350 QTC Calculation: 425 R Axis:   -2 Text Interpretation:  Normal sinus rhythm Minimal voltage criteria for  LVH, may be normal variant Borderline ECG Nonspecific T wave abnormality  Confirmed by Winfred Leeds  MD, Ronit Cranfield ([id number]) on 11/14/2015 11:43:54 AM     Chest x-ray  viewed by me. Results for orders placed or performed during the hospital encounter of [id number]  Basic metabolic panel  Result Value Ref Range   Sodium 140 135 - 145 mmol/L   Potassium 4.1 3.5 - 5.1 mmol/L   Chloride 106 101 - 111 mmol/L   CO2 24 22 - 32 mmol/L   Glucose, Bld 120 (H) 65 - 99 mg/dL   BUN 6 6 - 20 mg/dL   Creatinine, Ser 0.98 0.61 - 1.24 mg/dL   Calcium 9.3 8.9 - 10.3 mg/dL   GFR calc non Af Amer >60 >60 mL/min   GFR calc Af Amer >60 >60 mL/min   Anion gap 10 5 - 15  CBC  Result Value Ref Range   WBC 6.0 4.0 - 10.5 K/uL   RBC 4.08 (L) 4.22 - 5.81 MIL/uL   Hemoglobin 12.0 (L) 13.0 - 17.0 g/dL   HCT 36.8 (L) 39.0 - 52.0 %   MCV 90.2 78.0 - 100.0 fL   MCH 29.4 26.0 - 34.0 pg   MCHC 32.6 30.0 - 36.0 g/dL   RDW 13.8 11.5 - 15.5 %   Platelets 220 150 - 400 K/uL  I-stat troponin, ED (not at [facility])  Result Value Ref Range   Troponin i, poc 0.00 0.00 - 0.08 ng/mL   Comment 3           Dg Chest 2 View  11/14/2015  CLINICAL DATA:  Left-sided chest pain EXAM: CHEST  2 VIEW COMPARISON:  06/17/2015 FINDINGS: The heart size and mediastinal contours are within normal limits. Both lungs are clear. The visualized skeletal structures show postsurgical changes in the cervical spine. IMPRESSION: No active cardiopulmonary disease. Electronically Signed   By: Inez Catalina M.D.   On: 11/14/2015 12:25    MDM  Symptoms strongly suggestive of unstable angina in this patient with known coronary disease Dr. Meda Coffee from cardiology was consulted and evaluated patient in the ED and made arrangements for admission Final diagnoses:  None   Dx unstable angina    Orlie Dakin, MD 11/14/15 1416

## 2015-11-14 NOTE — H&P (Signed)
Patient ID: [name] MRN: [id number], DOB/AGE: 11-14-1930   Admit date: 11/14/2015  Primary Physician: [name], MD Primary Cardiologist:   Pt. Profile:  CAD, chest pain  Problem List  Past Medical History  Diagnosis Date  . Hypertension   . Hyperlipidemia   . Chronic chest pain   . GERD (gastroesophageal reflux disease)     h/o esophageal spasm  . Asthma   . Coronary artery disease     a. s/p BMS to RCA in 2002; b. s/p cutting balloon POBA ;   c. cath 6/12: oDx 80% (treated with repeat cutting balloon POBA), mLAD 50% with 30-40% at Dx, CFX 30%, pRCA 25% with patent stents;  d.  Lex MV 4/14:  Low Risk - EF 61%, inf scar with peri-infarct ischemia  . Myocardial infarction ([place]) 2001    x 1, confirned 1 possible  . Dyspnea     chronic  . History of blood transfusion     "related to OR"  . History of hiatal hernia   . Headache   . Arthritis     "hips, back" (06/17/2015)  . Chronic lower back pain   . Melanoma of lower back ([place]) late 1990's  . Memory loss     Past Surgical History  Procedure Laterality Date  . Cervical disc surgery  multiple  . Back surgery  multiple  . Laminectomy    . Posterior laminectomy / decompression cervical spine    . Anterior cervical decomp/discectomy fusion    . Esophagogastroduodenoscopy  03/23/2012    Procedure: ESOPHAGOGASTRODUODENOSCOPY (EGD);  Surgeon: Winfield Cunas., MD;  Location: [facility];  Service: Endoscopy;  Laterality: N/A;  . Savory dilation  03/23/2012    Procedure: SAVORY DILATION;  Surgeon: Winfield Cunas., MD;  Location: [facility];  Service: Endoscopy;  Laterality: N/A;  . Esophagogastroduodenoscopy N/A 03/04/2014    Procedure: ESOPHAGOGASTRODUODENOSCOPY (EGD);  Surgeon: Arta Silence, MD;  Location: [facility] ENDOSCOPY;  Service: Endoscopy;  Laterality: N/A;  . Cataract extraction w/ intraocular lens  implant, bilateral Bilateral   . Colonoscopy with propofol N/A 04/17/2014    Procedure: COLONOSCOPY  WITH PROPOFOL;  Surgeon: Winfield Cunas., MD;  Location: [facility] ENDOSCOPY;  Service: Endoscopy;  Laterality: N/A;  . Left heart catheterization with coronary angiogram N/A 01/13/2014    Procedure: LEFT HEART CATHETERIZATION WITH CORONARY ANGIOGRAM;  Surgeon: Blane Ohara, MD;  Location: [facility] CATH LAB;  Service: Cardiovascular;  Laterality: N/A;  . Tonsillectomy  1930's  . Cardiac catheterization      "I've had 17 caths" (06/17/2015)  . Coronary angioplasty with stent placement  x 2 stents     previous percutaneous intervention on the  RCA and the diagonal branch  . Melanoma excision  late 1990's    "lower back"    Allergies Allergies  Allergen Reactions  . Budesonide-Formoterol Fumarate Swelling    Mouth and tongue swelling.  . Iohexol Shortness Of Breath and Itching    Pt was given 100cc of Omnipaque 300 followed by itching/ dyspnea.  . Simvastatin Other (See Comments)    REACTION: unknown  . Demerol [Meperidine] Other (See Comments)    REACTION: Hallucinations  . Ivp Dye [Iodinated Diagnostic Agents] Other (See Comments)    Iodine pt had a reaction when he had a CT DONE  . Meperidine Hcl Other (See Comments)    REACTION: Hallucinations  . Tape Other (See Comments)    Tears skin off, Please use "paper"  tape  . Naproxen Swelling  . Pregabalin Rash  . Sulfonamide Derivatives Rash    HPI  [name] is a 82 y.o. male who has a history of CAD, cath in 2012 when he underwent cutting balloon angioplasty of diagonal and PCI/RCA. He was admitted in 11/2013 with NSTEMI, repeat cath showed patent stent to the RCA. Ostial diagonal demonstrated 70-75%. FFR was normal (0.95 at peak hyperemia). Medical therapy was recommended.  Admitted 5/17-5/20/15 for GI bleed. No source of bleed identified. Felt to be due to diverticular origin.  Last seen by Dr. Harrington Challenger in 4/21016. He complained of dyspnea and she ordered a myoview, which returned normal. He also complained of dizziness and he was  found to be orthostatic. She stopped amlodipine and cut cardura.  Seen in [facility] ER 06/17/15 with HA and evelated blood pressure in the setting on not taking his medications that AM. Head CT was negative for acute findings. He was admitted to [facility] 8/31-06/18/15 for hypertensive urgency. Amlodipine was increased from 5 mg QD to 10 mg QD to help treat HTN and chronic stable angina. He was seen by Angelena Form PA in December 2016 when he complained of occasional chest pain, relieved by 2 SL NTG. This occurred while sitting at rest.   Today he states that he has been experiencing chest pain typical for him retrosternal radiating to his neck and left arm, relieved by NTG.  He has been complaint with his meds.  No LE edema, orthopnea or PND. No syncope. No blood in his stool or urine.   Home Medications  Prior to Admission medications   Medication Sig Start Date End Date Taking? Authorizing Provider  acetaminophen (TYLENOL) 325 MG tablet Take 2 tablets (650 mg total) by mouth every 4 (four) hours as needed for headache or mild pain. 12/16/13  Yes Luke K Kilroy, PA-C  amLODipine (NORVASC) 10 MG tablet Take 1 tablet (10 mg total) by mouth daily. 06/18/15  Yes Shela Leff, MD  aspirin EC 81 MG tablet Take 1 tablet (81 mg total) by mouth daily. 03/08/14  Yes Maryann Mikhail, DO  atorvastatin (LIPITOR) 40 MG tablet TAKE 1 TABLET BY MOUTH AT BEDTIME FOR CHOLESTEROL 08/12/15  Yes Fay Records, MD  clopidogrel (PLAVIX) 75 MG tablet TAKE 1 TABLET BY MOUTH ONCE DAILY. 08/12/15  Yes Fay Records, MD  cyclobenzaprine (FLEXERIL) 5 MG tablet Take 7.5 mg by mouth 3 (three) times daily.    Yes Historical Provider, MD  doxazosin (CARDURA) 1 MG tablet Take 1 tablet (1 mg total) by mouth daily. 09/22/15  Yes Eileen Stanford, PA-C  fluocinonide gel (LIDEX) [id number] % Apply 1 application topically 2 (two) times daily.  11/12/15  Yes Historical Provider, MD  furosemide (LASIX) 40 MG tablet Take 1 tablet (40 mg total) by mouth  daily as needed for edema. 01/30/14  Yes Scott Joylene Draft, PA-C  HYDROcodone-acetaminophen (NORCO) 7.5-325 MG per tablet Take 1 tablet by mouth every 4 (four) hours as needed for moderate pain. FOR PAIN 06/05/14  Yes Historical Provider, MD  isosorbide mononitrate (IMDUR) 60 MG 24 hr tablet TAKE  1 1/2 TABLET BY MOUTH DAILY 09/22/15  Yes Eileen Stanford, PA-C  metoprolol tartrate (LOPRESSOR) 25 MG tablet TAKE 1 TABLET BY MOUTH TWICE DAILY 08/12/15  Yes Fay Records, MD  nitroGLYCERIN (NITROSTAT) 0.4 MG SL tablet Place 0.4 mg under the tongue every 5 (five) minutes as needed for chest pain (x 3 pills daily).   Yes Historical  Provider, MD  pantoprazole (PROTONIX) 40 MG tablet TAKE 1 TABLET BY MOUTH TWICE DAILY FOR REFLUX. 03/13/15  Yes Fay Records, MD  tamsulosin (FLOMAX) 0.4 MG CAPS capsule Take 0.4 mg by mouth daily.  01/13/15  Yes Historical Provider, MD  ZETIA 10 MG tablet TAKE (1/2) TABLET BY MOUTH ONCE DAILY. 11/06/15  Yes Fay Records, MD  doxazosin (CARDURA) 2 MG tablet Take 2 mg by mouth daily. Reported on 11/14/2015 08/11/15   Historical Provider, MD   Family History  Family History  Problem Relation Age of Onset  . Diabetes Father   . Heart disease Father   . Asthma Father   . Heart disease Mother     CABG hx age 49  . Colon cancer Son     hx   . Colitis Son     hx  . Crohn's disease Son   . Prostate cancer Paternal Grandfather    Social History  Social History   Social History  . Marital Status: Widowed    Spouse Name: N/A  . Number of Children: 5  . Years of Education: 11   Occupational History  . retired     Clinical research associate   Social History Main Topics  . Smoking status: Never Smoker   . Smokeless tobacco: Never Used  . Alcohol Use: No  . Drug Use: No  . Sexual Activity: No   Other Topics Concern  . Not on file   Social History Narrative   Lives in [place]. Generaly active around the house and walks his dog without chest pain or SOB.   Right-handed.    Lives alone.   No caffeine use.    Review of Systems General:  No chills, fever, night sweats or weight changes.  Cardiovascular:  No chest pain, dyspnea on exertion, edema, orthopnea, palpitations, paroxysmal nocturnal dyspnea. Dermatological: No rash, lesions/masses Respiratory: No cough, dyspnea Urologic: No hematuria, dysuria Abdominal:   No nausea, vomiting, diarrhea, bright red blood per rectum, melena, or hematemesis Neurologic:  No visual changes, wkns, changes in mental status. All other systems reviewed and are otherwise negative except as noted above.  Physical Exam  Blood pressure 153/98, pulse 87, temperature 98.1 F (36.7 C), temperature source Oral, resp. rate 15, height 5\' 10"  (1.778 m), weight 171 lb (77.565 kg), SpO2 100 %.  General: Pleasant, NAD Psych: Normal affect. Neuro: Alert and oriented X 3. Moves all extremities spontaneously. HEENT: Normal  Neck: Supple without bruits or JVD. Lungs:  Resp regular and unlabored, CTA. Heart: RRR no s3, s4, or murmurs. Abdomen: Soft, non-tender, non-distended, BS + x 4.  Extremities: No clubbing, cyanosis or edema. DP/PT/Radials 2+ and equal bilaterally.  Labs  No results for input(s): CKTOTAL, CKMB, TROPONINI in the last 72 hours. Lab Results  Component Value Date   WBC 6.0 11/14/2015   HGB 12.0* 11/14/2015   HCT 36.8* 11/14/2015   MCV 90.2 11/14/2015   PLT 220 11/14/2015    Recent Labs Lab 11/14/15 1128  NA 140  K 4.1  CL 106  CO2 24  BUN 6  CREATININE 0.98  CALCIUM 9.3  GLUCOSE 120*   Lab Results  Component Value Date   CHOL 123 01/19/2015   HDL 45.60 01/19/2015   LDLCALC 63 01/19/2015   TRIG 71.0 01/19/2015   Lab Results  Component Value Date   DDIMER 0.95* 01/19/2015   Dg Chest 2 View  11/14/2015  CLINICAL DATA:  Left-sided chest pain EXAM: CHEST  2 VIEW COMPARISON:  06/17/2015 FINDINGS: The heart size and mediastinal contours are within normal limits. Both lungs are clear. The visualized  skeletal structures show postsurgical changes in the cervical spine. IMPRESSION: No active cardiopulmonary disease. Electronically Signed   By: Inez Catalina M.D.   On: 11/14/2015 12:25    Echocardiogram: 12/2013 Study data: Technically difficult study. - Left ventricle: The cavity size was normal. Wall thickness was normal. Systolic function was normal. The estimated ejection fraction was in the range of 55% to 60%. The study is not technically sufficient to allow evaluation of LV diastolic function. - Aortic valve: Mildly calcified annulus. Mildly thickened leaflets. Valve area: 1.6cm^2(VTI). Valve area: 1.71cm^2 (Vmax).  ECG: SR, borderline LVH, otherwise normal  Cath: 01/03/2014 Left mainstem: The left main stem is mildly calcified. The vessel is widely patent and divides into the LAD and left circumflex.  Left anterior descending (LAD): The LAD is mild to moderately calcified. The proximal vessel is widely patent. The first diagonal origin has a 70-75% stenosis. The LAD after the diagonal has a 40-50% stenosis. There is mild diffuse nonobstructive disease throughout the course of the LAD but there is no significant high-grade stenosis noted.  Left circumflex (LCx): The left circumflex is large in caliber. There is mild diffuse irregularity but no significant stenosis noted throughout. The obtuse marginal branches are patent.  Right coronary artery (RCA): The RCA is dominant. The stented segment throughout the midportion is patent. There is mild 30-40% in-stent restenosis in the middle portion of the stented segment. There is mild irregularity proximally. The distal vessel is widely patent.  Left ventriculography: Deferred, known to be normal from recent Myoview.  Final Conclusions:  1. Single-vessel coronary artery disease with moderately tight stenosis of the ostium of the diagonal branch, normal flow by FFR 2. Mild diffuse nonobstructive coronary artery disease 3. Known  normal LV function by noninvasive assessment     ASSESSMENT AND PLAN  [name] is a 82 y.o. male with a history of CAD s/p stent to RCA and cutting balloon angioplasty to the Dx, chronic chest pain, chronic dysphagia s/p dilations x2, HTN, HLD, and BPH who presents to clinic for follow up.   1. CAD/chronic chest pain: As noted, most recent cardiac catheterization demonstrated patent stent and nonobstructive disease in the diagonal (12/2013). Negative myoview in 01/2015. - continue cycling troponin and ECGs  -- Recently increased Imdur with no improvement, we will perform a stress test tomorrow, if negative add Ranexa to his regimen/  2. Hypertension: uncontrolled, might be sec to pain, we will follow for now as he has h/o orthostatic hypotension.   3. Hyperlipidemia: Continue statin.  4. GERD: Continue PPI.   5. Dizziness- think this is caused in large part by his alpha blocker. I will decrease Cardura from 2mg  to 1 mg   6. HLD- continue statin      Signed, Dorothy Spark, MD, [facility] 11/14/2015, 3:24 PM

## 2015-11-14 NOTE — Consult Note (Signed)
Entered in error

## 2015-11-15 ENCOUNTER — Inpatient Hospital Stay (HOSPITAL_COMMUNITY): Payer: Medicare Other

## 2015-11-15 DIAGNOSIS — I2583 Coronary atherosclerosis due to lipid rich plaque: Secondary | ICD-10-CM

## 2015-11-15 DIAGNOSIS — I10 Essential (primary) hypertension: Secondary | ICD-10-CM

## 2015-11-15 DIAGNOSIS — I2510 Atherosclerotic heart disease of native coronary artery without angina pectoris: Secondary | ICD-10-CM | POA: Insufficient documentation

## 2015-11-15 DIAGNOSIS — R072 Precordial pain: Secondary | ICD-10-CM

## 2015-11-15 DIAGNOSIS — R079 Chest pain, unspecified: Secondary | ICD-10-CM

## 2015-11-15 LAB — NM MYOCAR MULTI W/SPECT W/WALL MOTION / EF
Estimated workload: 1 METS
Exercise duration (min): 5 min
MPHR: 138 {beats}/min
Peak HR: 100 {beats}/min
Percent HR: 72 %
Rest HR: 68 {beats}/min

## 2015-11-15 LAB — TROPONIN I
Troponin I: <0.03 ng/mL (ref <0.031)
Troponin I: <0.03 ng/mL (ref <0.031)
Troponin I: <0.03 ng/mL (ref <0.031)

## 2015-11-15 LAB — BASIC METABOLIC PANEL
Anion gap: 8 (ref 5–15)
BUN: 6 mg/dL (ref 6–20)
CO2: 29 mmol/L (ref 22–32)
Calcium: 9.1 mg/dL (ref 8.9–10.3)
Chloride: 104 mmol/L (ref 101–111)
Creatinine, Ser: 0.84 mg/dL (ref 0.61–1.24)
GFR calc Af Amer: >60 mL/min (ref >60)
GFR calc non Af Amer: >60 mL/min (ref >60)
Glucose, Bld: 97 mg/dL (ref 65–99)
Potassium: 3.9 mmol/L (ref 3.5–5.1)
Sodium: 141 mmol/L (ref 135–145)

## 2015-11-15 MED ORDER — TECHNETIUM TC 99M SESTAMIBI GENERIC - CARDIOLITE
10.0000 | Freq: Once | INTRAVENOUS | Status: AC | PRN
  Administered 2015-11-15: 10 via INTRAVENOUS

## 2015-11-15 MED ORDER — TECHNETIUM TC 99M SESTAMIBI GENERIC - CARDIOLITE
30.0000 | Freq: Once | INTRAVENOUS | Status: AC | PRN
  Administered 2015-11-15: 30 via INTRAVENOUS

## 2015-11-15 MED ORDER — REGADENOSON 0.4 MG/5ML IV SOLN
INTRAVENOUS | Status: AC
  Filled 2015-11-15: qty 5

## 2015-11-15 MED ORDER — LOSARTAN POTASSIUM 25 MG PO TABS: 25.0000 mg | ORAL_TABLET | Freq: Every day | ORAL | Status: DC

## 2015-11-15 MED ORDER — REGADENOSON 0.4 MG/5ML IV SOLN
0.4000 mg | Freq: Once | INTRAVENOUS | Status: AC
  Administered 2015-11-15: 0.4 mg via INTRAVENOUS
  Filled 2015-11-15: qty 5

## 2015-11-15 NOTE — Discharge Summary (Signed)
Discharge Summary    Patient ID: [name],  MRN: [id number], DOB/AGE: 02/19/1931 82 y.o.  Admit date: 11/14/2015 Discharge date: 11/15/2015  Primary Care Provider: [name] Primary Cardiologist: Dr. Harrington Challenger  Discharge Diagnoses    Active Problems:   Chest pain   Unstable angina [facility])   Coronary artery disease due to lipid rich plaque   Allergies Allergies  Allergen Reactions  . Budesonide-Formoterol Fumarate Swelling    Mouth and tongue swelling.  . Iohexol Shortness Of Breath and Itching    Pt was given 100cc of Omnipaque 300 followed by itching/ dyspnea.  . Simvastatin Other (See Comments)    REACTION: unknown  . Demerol [Meperidine] Other (See Comments)    REACTION: Hallucinations  . Ivp Dye [Iodinated Diagnostic Agents] Other (See Comments)    Iodine pt had a reaction when he had a CT DONE  . Meperidine Hcl Other (See Comments)    REACTION: Hallucinations  . Tape Other (See Comments)    Tears skin off, Please use "paper" tape  . Naproxen Swelling  . Pregabalin Rash  . Sulfonamide Derivatives Rash    Diagnostic Studies/Procedures    EXAM: MYOCARDIAL IMAGING WITH SPECT (REST AND PHARMACOLOGIC-STRESS)  GATED LEFT VENTRICULAR WALL MOTION STUDY  LEFT VENTRICULAR EJECTION FRACTION  TECHNIQUE: Standard myocardial SPECT imaging was performed after resting intravenous injection of 10 mCi Tc-71m sestamibi. Subsequently, intravenous infusion of Lexiscan was performed under the supervision of the Cardiology staff. At peak effect of the drug, 30 mCi Tc-37m sestamibi was injected intravenously and standard myocardial SPECT imaging was performed. Quantitative gated imaging was also performed to evaluate left ventricular wall motion, and estimate left ventricular ejection fraction.  COMPARISON: 12/16/2013  FINDINGS: Perfusion: Fixed defect noted inferiorly as seen on prior study compatible with moderate inferior wall scar. No reversible  ischemia  Wall Motion: Mildly decreased motion in the inferior wall, otherwise unremarkable.  Left Ventricular Ejection Fraction: 55 %  End diastolic volume 84 ml  End systolic volume 38 ml  IMPRESSION: 1. No reversible ischemia or infarction.  2. Normal left ventricular wall motion.  3. Left ventricular ejection fraction 55%  _____________   History of Present Illness     [name] is a 82 y.o. male with a history of CAD s/p stent to RCA and cutting balloon angioplasty to the Dx, chronic chest pain, chronic dysphagia s/p dilations x2, HTN, HLD, and BPH who presented to the ED on 11/14/15 with CP. His most recent cath in 2015 showed patent stent and nonobstructive disease in the diagonal (12/2013). He had a negative myoview in 01/2015. Given his cardiac history and symptoms, he was admitted for r/o.   Hospital Course     Patient was admitted to telemetry. Cardiac enzymes were cycled x 3 and were negative, ruling him out for MI. He underwent a NST that showed an old inferolateral MI but no ischemia.  EF was normal at 55%. Continued medical therapy for secondary prevention was recommended. Given moderately elevated BPs during his hospitalization, he was started on 25 mg of losartan daily. He was last seen and examined by Dr. Meda Coffee who determined he was stable for discharge home. He will f/u with Dr. Harrington Challenger.   Consultants: None   _____________  Discharge Vitals Blood pressure 140/76, pulse 65, temperature 97.9 F (36.6 C), temperature source Oral, resp. rate 21, height 5\' 5"  (1.651 m), weight 163 lb 1.6 oz (73.982 kg), SpO2 98 %.  Filed Weights   11/14/15  1125 11/14/15 1630 11/15/15 0500  Weight: 171 lb (77.565 kg) 165 lb 11.2 oz (75.161 kg) 163 lb 1.6 oz (73.982 kg)    Labs & Radiologic Studies     CBC  Recent Labs  11/14/15 1128  WBC 6.0  HGB 12.0*  HCT 36.8*  MCV 90.2  PLT [id number]   Basic Metabolic Panel  Recent Labs  11/14/15 1128 11/15/15 0534  NA 140 141   K 4.1 3.9  CL 106 104  CO2 24 29  GLUCOSE 120* 97  BUN 6 6  CREATININE 0.98 0.84  CALCIUM 9.3 9.1   Liver Function Tests No results for input(s): AST, ALT, ALKPHOS, BILITOT, PROT, ALBUMIN in the last 72 hours. No results for input(s): LIPASE, AMYLASE in the last 72 hours. Cardiac Enzymes  Recent Labs  11/14/15 2325 11/15/15 0534 11/15/15 1055  TROPONINI <0.03 <0.03 <0.03   BNP Invalid input(s): POCBNP D-Dimer No results for input(s): DDIMER in the last 72 hours. Hemoglobin A1C No results for input(s): HGBA1C in the last 72 hours. Fasting Lipid Panel No results for input(s): CHOL, HDL, LDLCALC, TRIG, CHOLHDL, LDLDIRECT in the last 72 hours. Thyroid Function Tests No results for input(s): TSH, T4TOTAL, T3FREE, THYROIDAB in the last 72 hours.  Invalid input(s): FREET3  Dg Chest 2 View  11/14/2015  CLINICAL DATA:  Left-sided chest pain EXAM: CHEST  2 VIEW COMPARISON:  06/17/2015 FINDINGS: The heart size and mediastinal contours are within normal limits. Both lungs are clear. The visualized skeletal structures show postsurgical changes in the cervical spine. IMPRESSION: No active cardiopulmonary disease. Electronically Signed   By: Inez Catalina M.D.   On: 11/14/2015 12:25   Nm Myocar Multi W/spect W/wall Motion / Ef  11/15/2015  CLINICAL DATA:  Chest pain, left arm and neck pain. EXAM: MYOCARDIAL IMAGING WITH SPECT (REST AND PHARMACOLOGIC-STRESS) GATED LEFT VENTRICULAR WALL MOTION STUDY LEFT VENTRICULAR EJECTION FRACTION TECHNIQUE: Standard myocardial SPECT imaging was performed after resting intravenous injection of 10 mCi Tc-55m sestamibi. Subsequently, intravenous infusion of Lexiscan was performed under the supervision of the Cardiology staff. At peak effect of the drug, 30 mCi Tc-44m sestamibi was injected intravenously and standard myocardial SPECT imaging was performed. Quantitative gated imaging was also performed to evaluate left ventricular wall motion, and estimate left  ventricular ejection fraction. COMPARISON:  12/16/2013 FINDINGS: Perfusion: Fixed defect noted inferiorly as seen on prior study compatible with moderate inferior wall scar. No reversible ischemia Wall Motion: Mildly decreased motion in the inferior wall, otherwise unremarkable. Left Ventricular Ejection Fraction: 55 % End diastolic volume 84 ml End systolic volume 38 ml IMPRESSION: 1. No reversible ischemia or infarction. 2. Normal left ventricular wall motion. 3. Left ventricular ejection fraction 55% 4. Intermediate-risk stress test findings*. *2012 Appropriate Use Criteria for Coronary Revascularization Focused Update: J Am Coll Cardiol. N6492421. http://content.airportbarriers.com.aspx?articleid=[email or web address] Electronically Signed   By: Rolm Baptise M.D.   On: 11/15/2015 13:50    Disposition   Pt is being discharged home today in good condition.  Follow-up Plans & Appointments    Follow-up Information    Follow up with Dorris Carnes, MD.   Specialty:  Cardiology   Why:  our office will call you with a follow-up appointment   Contact information:   [address] [place] [address] [phone]        Discharge Medications   Discharge Medication List as of 11/15/2015  3:58 PM    START taking these medications   Details  losartan (COZAAR) 25 MG  tablet Take 1 tablet (25 mg total) by mouth daily., Starting 11/15/2015, Until Discontinued, Normal      CONTINUE these medications which have NOT CHANGED   Details  acetaminophen (TYLENOL) 325 MG tablet Take 2 tablets (650 mg total) by mouth every 4 (four) hours as needed for headache or mild pain., Starting 12/16/2013, Until Discontinued, OTC    amLODipine (NORVASC) 10 MG tablet Take 1 tablet (10 mg total) by mouth daily., Starting 06/18/2015, Until Discontinued, Normal    aspirin EC 81 MG tablet Take 1 tablet (81 mg total) by mouth daily., Starting 03/08/2014, Until Discontinued, No Print    atorvastatin (LIPITOR)  40 MG tablet TAKE 1 TABLET BY MOUTH AT BEDTIME FOR CHOLESTEROL, Normal    clopidogrel (PLAVIX) 75 MG tablet TAKE 1 TABLET BY MOUTH ONCE DAILY., Normal    cyclobenzaprine (FLEXERIL) 5 MG tablet Take 7.5 mg by mouth 3 (three) times daily. , Until Discontinued, Historical Med    doxazosin (CARDURA) 1 MG tablet Take 1 tablet (1 mg total) by mouth daily., Starting 09/22/2015, Until Discontinued, Normal    fluocinonide gel (LIDEX) [id number] % Apply 1 application topically 2 (two) times daily. , Starting 11/12/2015, Until Discontinued, Historical Med    furosemide (LASIX) 40 MG tablet Take 1 tablet (40 mg total) by mouth daily as needed for edema., Starting 01/30/2014, Until Discontinued, Normal    HYDROcodone-acetaminophen (NORCO) 7.5-325 MG per tablet Take 1 tablet by mouth every 4 (four) hours as needed for moderate pain. FOR PAIN, Starting 06/05/2014, Until Discontinued, Historical Med    isosorbide mononitrate (IMDUR) 60 MG 24 hr tablet TAKE  1 1/2 TABLET BY MOUTH DAILY, Normal    metoprolol tartrate (LOPRESSOR) 25 MG tablet TAKE 1 TABLET BY MOUTH TWICE DAILY, Normal    nitroGLYCERIN (NITROSTAT) 0.4 MG SL tablet Place 0.4 mg under the tongue every 5 (five) minutes as needed for chest pain (x 3 pills daily)., Until Discontinued, Historical Med    pantoprazole (PROTONIX) 40 MG tablet TAKE 1 TABLET BY MOUTH TWICE DAILY FOR REFLUX., Normal    tamsulosin (FLOMAX) 0.4 MG CAPS capsule Take 0.4 mg by mouth daily. , Starting 01/13/2015, Until Discontinued, Historical Med    ZETIA 10 MG tablet TAKE (1/2) TABLET BY MOUTH ONCE DAILY., Normal           Outstanding Labs/Studies    Duration of Discharge Encounter   Greater than 30 minutes including physician time.  Alveta Heimlich, Demetrica Zipp NP 11/15/2015, 4:49 PM

## 2015-11-15 NOTE — Progress Notes (Addendum)
Patient Profile: 82 y.o. male with a history of CAD s/p stent to RCA and cutting balloon angioplasty to the Dx, chronic chest pain, chronic dysphagia s/p dilations x2, HTN, HLD, and BPH readmitted for CP.  Subjective: No complaints this morning. Currently CP free. No dyspnea.   Objective: Vital signs in last 24 hours: Temp:  [98.1 F (36.7 C)-98.6 F (37 C)] 98.3 F (36.8 C) (01/29 0500) Pulse Rate:  [63-88] 69 (01/29 0853) Resp:  [10-21] 20 (01/29 0853) BP: (135-166)/(67-101) 143/74 mmHg (01/29 0853) SpO2:  [92 %-100 %] 99 % (01/29 0500) Weight:  [163 lb 1.6 oz (73.982 kg)-171 lb (77.565 kg)] 163 lb 1.6 oz (73.982 kg) (01/29 0500) Last BM Date: 11/14/15  Intake/Output from previous day: 01/28 0701 - 01/29 0700 In: 240 [P.O.:240] Out: 400 [Urine:400] Intake/Output this shift:   Medications . amLODipine  10 mg Oral Daily  . aspirin EC  81 mg Oral Daily  . atorvastatin  40 mg Oral q1800  . clopidogrel  75 mg Oral Daily  . cyclobenzaprine  7.5 mg Oral TID  . doxazosin  1 mg Oral Daily  . enoxaparin (LOVENOX) injection  40 mg Subcutaneous Q24H  . ezetimibe  10 mg Oral Daily  . metoprolol tartrate  25 mg Oral BID  . regadenoson      . tamsulosin  0.4 mg Oral Daily    PE: General appearance: alert, cooperative and no distress Neck: no carotid bruit and no JVD Lungs: clear to auscultation bilaterally Heart: regular rate and rhythm, S1, S2 normal, no murmur, click, rub or gallop Extremities: no LEE, bilateral SCDs present Pulses: 2+ and symmetric Skin: warm and dry Neurologic: Grossly normal  Lab Results:   Recent Labs  11/14/15 1128  WBC 6.0  HGB 12.0*  HCT 36.8*  PLT 220   BMET  Recent Labs  11/14/15 1128 11/15/15 0534  NA 140 141  K 4.1 3.9  CL 106 104  CO2 24 29  GLUCOSE 120* 97  BUN 6 6  CREATININE 0.98 0.84  CALCIUM 9.3 9.1   Cardiac Panel (last 3 results)  Recent Labs  11/14/15 2325 11/15/15 0534  TROPONINI <0.03 <0.03     Studies/Results: NST- pending.    Assessment/Plan  Active Problems:   Chest pain   Unstable angina (HCC)  1. CAD/chronic chest pain: Most recent cardiac catheterization demonstrated patent stent and nonobstructive disease in the diagonal (12/2013). Negative myoview in 01/2015. Cardiac enzymes are negative. NST completed and results pending.  -- Recently increased Imdur with no improvement. If stress test is negative, we will add Ranexa to his regimen.  2. Hypertension: uncontrolled, might be sec to pain, we will follow for now as he has h/o orthostatic hypotension.   3. Hyperlipidemia: Continue statin.  4. GERD: Continue PPI.   5. Dizziness- think this is caused in large part by his alpha blocker. Cardura was decreased from 2mg  to 1 mg   6. HLD- continue statin    LOS: 1 day   Brittainy M. Ladoris Gene 11/15/2015 9:02 AM  The patient was seen, examined and discussed with Brittainy M. Rosita Fire, PA-C and I agree with the above.   82 year old male with known CAD who presented with chest pain and ruled out for ACS. He underwent a Lexiscan nuclear stress test today that showed an old inferolateral MI but no ischemia. His BP gets up to 160', I would add a low dose losartan 25 mg po daily to his regimen. He  ca be discharged today.  Dorothy Spark 11/15/2015

## 2015-11-15 NOTE — Plan of Care (Signed)
Problem: Phase II Progression Outcomes Goal: Anginal pain relieved Outcome: Completed/Met Date Met:  11/15/15 Pt remains free form chest pain  Goal: Stress Test if indicated Outcome: Completed/Met Date Met:  11/15/15 Pt went for stress test today   Problem: Phase III Progression Outcomes Goal: No anginal pain Outcome: Completed/Met Date Met:  11/15/15 Pt remains free form chest pain  Goal: Discharge plan remains appropriate-arrangements made Outcome: Completed/Met Date Met:  11/15/15 Pt will be discharged home today  Goal: Tolerating diet Outcome: Completed/Met Date Met:  11/15/15 Pt is tolerating current diet with no problems   Problem: Discharge Progression Outcomes Goal: No anginal pain Outcome: Completed/Met Date Met:  11/15/15 Pt remains free form chest pain  Goal: Discharge plan in place and appropriate Outcome: Completed/Met Date Met:  11/15/15 Pt will be discharged home today  Goal: Tolerates diet Outcome: Completed/Met Date Met:  11/15/15 Pt is tolerating current diet with no difficulties

## 2015-12-24 ENCOUNTER — Ambulatory Visit (INDEPENDENT_AMBULATORY_CARE_PROVIDER_SITE_OTHER): Payer: Medicare Other | Admitting: Internal Medicine

## 2015-12-24 ENCOUNTER — Encounter: Payer: Self-pay | Admitting: Internal Medicine

## 2015-12-24 VITALS — BP 124/62 | HR 68 | Ht 69.0 in | Wt 172.0 lb

## 2015-12-24 DIAGNOSIS — I10 Essential (primary) hypertension: Secondary | ICD-10-CM | POA: Diagnosis not present

## 2015-12-24 DIAGNOSIS — I200 Unstable angina: Secondary | ICD-10-CM | POA: Diagnosis not present

## 2015-12-24 DIAGNOSIS — E785 Hyperlipidemia, unspecified: Secondary | ICD-10-CM

## 2015-12-24 MED ORDER — AMLODIPINE BESYLATE 10 MG PO TABS: 5.0000 mg | ORAL_TABLET | Freq: Every day | ORAL | Status: DC

## 2015-12-24 NOTE — Patient Instructions (Signed)
Your physician has recommended you make the following change in your medication:  1.) DECREASE AMLODIPINE TO 5 MG ONCE A DAY 2.) STOP COZAAR  Your physician recommends that you schedule a follow-up appointment in: [facility] WITH SALLY EARL, PHARM D.  PLEASE BRING YOUR HOME BLOOD PRESSURE CUFF.

## 2015-12-24 NOTE — Progress Notes (Signed)
Cardiology Office Note   Date:  12/24/2015   ID:  [name], [name] 11-10-1930, MRN [id number]  PCP:  [name], MD  Cardiologist:   Dorris Carnes, MD   Chief Complaint  Patient presents with  . Edema    legs and ankles  . Dizziness      History of Present Illness: [name] is a 82 y.o. male with a history of   CAD s/p stent to RCA and cutting balloon angioplasty to the Dx, chronic chest pain, chronic dysphagia s/p dilations x2, HTN, HLD, and BPH who presented to the ED on 11/14/15 with CP. His most recent cath in 2015 showed patent stent and nonobstructive disease in the diagonal (12/2013). He had a negative myoview in 01/2015. Given his cardiac history and symptoms, he was admitted for r/o.  The pt was admitted  RO for MI  Myvieow showedInferolateral MI  No ischemia  LVEF 55%  Losartan 25 mg was started for better BP control      Outpatient Prescriptions Prior to Visit  Medication Sig Dispense Refill  . acetaminophen (TYLENOL) 325 MG tablet Take 2 tablets (650 mg total) by mouth every 4 (four) hours as needed for headache or mild pain.    Marland Kitchen amLODipine (NORVASC) 10 MG tablet Take 1 tablet (10 mg total) by mouth daily. 30 tablet 2  . aspirin EC 81 MG tablet Take 1 tablet (81 mg total) by mouth daily.    Marland Kitchen atorvastatin (LIPITOR) 40 MG tablet TAKE 1 TABLET BY MOUTH AT BEDTIME FOR CHOLESTEROL 90 tablet 1  . clopidogrel (PLAVIX) 75 MG tablet TAKE 1 TABLET BY MOUTH ONCE DAILY. 90 tablet 1  . cyclobenzaprine (FLEXERIL) 5 MG tablet Take 7.5 mg by mouth 3 (three) times daily.     Marland Kitchen doxazosin (CARDURA) 1 MG tablet Take 1 tablet (1 mg total) by mouth daily. 30 tablet 11  . fluocinonide gel (LIDEX) [id number] % Apply 1 application topically 2 (two) times daily.     . furosemide (LASIX) 40 MG tablet Take 1 tablet (40 mg total) by mouth daily as needed for edema. 30 tablet 5  . HYDROcodone-acetaminophen (NORCO) 7.5-325 MG per tablet Take 1 tablet by mouth every 4 (four) hours as  needed for moderate pain. FOR PAIN    . isosorbide mononitrate (IMDUR) 60 MG 24 hr tablet TAKE  1 1/2 TABLET BY MOUTH DAILY 45 tablet 11  . losartan (COZAAR) 25 MG tablet Take 1 tablet (25 mg total) by mouth daily. 30 tablet 5  . metoprolol tartrate (LOPRESSOR) 25 MG tablet TAKE 1 TABLET BY MOUTH TWICE DAILY 180 tablet 1  . nitroGLYCERIN (NITROSTAT) 0.4 MG SL tablet Place 0.4 mg under the tongue every 5 (five) minutes as needed for chest pain (x 3 pills daily).    . pantoprazole (PROTONIX) 40 MG tablet TAKE 1 TABLET BY MOUTH TWICE DAILY FOR REFLUX. 60 tablet 11  . tamsulosin (FLOMAX) 0.4 MG CAPS capsule Take 0.4 mg by mouth daily.     Marland Kitchen ZETIA 10 MG tablet TAKE (1/2) TABLET BY MOUTH ONCE DAILY. 45 tablet 2   No facility-administered medications prior to visit.     Allergies:   Budesonide-formoterol fumarate; Iohexol; Simvastatin; Demerol; Ivp dye; Meperidine hcl; Tape; Naproxen; Pregabalin; and Sulfonamide derivatives   Past Medical History  Diagnosis Date  . Hypertension   . Hyperlipidemia   . Chronic chest pain   . GERD (gastroesophageal reflux disease)     h/o esophageal spasm  .  Asthma   . Coronary artery disease     a. s/p BMS to RCA in 2002; b. s/p cutting balloon POBA ;   c. cath 6/12: oDx 80% (treated with repeat cutting balloon POBA), mLAD 50% with 30-40% at Dx, CFX 30%, pRCA 25% with patent stents;  d.  Lex MV 4/14:  Low Risk - EF 61%, inf scar with peri-infarct ischemia  . Myocardial infarction ([place]) 2001    x 1, confirned 1 possible  . Dyspnea     chronic  . History of blood transfusion     "related to OR"  . History of hiatal hernia   . Headache   . Arthritis     "hips, back" (06/17/2015)  . Chronic lower back pain   . Melanoma of lower back ([place]) late 1990's  . Memory loss     Past Surgical History  Procedure Laterality Date  . Cervical disc surgery  multiple  . Back surgery  multiple  . Laminectomy    . Posterior laminectomy / decompression cervical spine      . Anterior cervical decomp/discectomy fusion    . Esophagogastroduodenoscopy  03/23/2012    Procedure: ESOPHAGOGASTRODUODENOSCOPY (EGD);  Surgeon: Winfield Cunas., MD;  Location: [facility];  Service: Endoscopy;  Laterality: N/A;  . Savory dilation  03/23/2012    Procedure: SAVORY DILATION;  Surgeon: Winfield Cunas., MD;  Location: [facility];  Service: Endoscopy;  Laterality: N/A;  . Esophagogastroduodenoscopy N/A 03/04/2014    Procedure: ESOPHAGOGASTRODUODENOSCOPY (EGD);  Surgeon: Arta Silence, MD;  Location: [facility] ENDOSCOPY;  Service: Endoscopy;  Laterality: N/A;  . Cataract extraction w/ intraocular lens  implant, bilateral Bilateral   . Colonoscopy with propofol N/A 04/17/2014    Procedure: COLONOSCOPY WITH PROPOFOL;  Surgeon: Winfield Cunas., MD;  Location: [facility] ENDOSCOPY;  Service: Endoscopy;  Laterality: N/A;  . Left heart catheterization with coronary angiogram N/A 01/13/2014    Procedure: LEFT HEART CATHETERIZATION WITH CORONARY ANGIOGRAM;  Surgeon: Blane Ohara, MD;  Location: [facility] CATH LAB;  Service: Cardiovascular;  Laterality: N/A;  . Tonsillectomy  1930's  . Cardiac catheterization      "I've had 17 caths" (06/17/2015)  . Coronary angioplasty with stent placement  x 2 stents     previous percutaneous intervention on the  RCA and the diagonal branch  . Melanoma excision  late 1990's    "lower back"     Social History:  The patient  reports that he has never smoked. He has never used smokeless tobacco. He reports that he does not drink alcohol or use illicit drugs.   Family History:  The patient's family history includes Asthma in his father; Colitis in his son; Colon cancer in his son; Crohn's disease in his son; Diabetes in his father; Heart disease in his father and mother; Prostate cancer in his paternal grandfather.    ROS:  Please see the history of present illness. All other systems are reviewed and  Negative to the above problem except as noted.    PHYSICAL  EXAM: VS:  BP 124/62 mmHg  Pulse 68  Ht 5\' 9"  (1.753 m)  Wt 172 lb (78.019 kg)  BMI 25.39 kg/m2  GEN: Well nourished, well developed, in no acute distress HEENT: normal Neck: no JVD, carotid bruits, or masses Cardiac: RRR; no murmurs, rubs, or gallops,no edema  Respiratory:  clear to auscultation bilaterally, normal work of breathing GI: soft, nontender, nondistended, + BS  No hepatomegaly  MS: no deformity Moving  all extremities   Skin: warm and dry, no rash Neuro:  Strength and sensation are intact Psych: euthymic mood, full affect   EKG:  EKG is not ordered today.   Lipid Panel    Component Value Date/Time   CHOL 123 01/19/2015 1315   TRIG 71.0 01/19/2015 1315   HDL 45.60 01/19/2015 1315   CHOLHDL 3 01/19/2015 1315   VLDL 14.2 01/19/2015 1315   LDLCALC 63 01/19/2015 1315      Wt Readings from Last 3 Encounters:  12/24/15 172 lb (78.019 kg)  11/15/15 163 lb 1.6 oz (73.982 kg)  09/22/15 170 lb (77.111 kg)      ASSESSMENT AND PLAN:  1 CAD  No symptoms to sugg active angina    2.  HTN  Pt dizzy a lot  No syncope   BP is on lower side today  I would recomm cutting back on meds  Stop cozaar   PUlt back on amlodipine t o5 mg  Will f/u in 1 month with S Earl  Bring BP cuff   3 HL  Good control      Current medicines are reviewed at length with the patient today.  The patient does not have concerns regarding medicines.  The following changes have been made:   Labs/ tests ordered today include: No orders of the defined types were placed in this encounter.     Disposition:   FU with  in   Signed, Dorris Carnes, MD  12/24/2015 11:26 AM    [address], [place], [place]  [address] Phone: [phone]; Fax: [phone]

## 2016-01-22 ENCOUNTER — Other Ambulatory Visit: Payer: Self-pay | Admitting: Internal Medicine

## 2016-01-26 ENCOUNTER — Ambulatory Visit (INDEPENDENT_AMBULATORY_CARE_PROVIDER_SITE_OTHER): Payer: Medicare Other | Admitting: Pharmacist

## 2016-01-26 VITALS — BP 136/74 | HR 68

## 2016-01-26 DIAGNOSIS — I200 Unstable angina: Secondary | ICD-10-CM

## 2016-01-26 DIAGNOSIS — R42 Dizziness and giddiness: Secondary | ICD-10-CM

## 2016-01-26 NOTE — Progress Notes (Signed)
Cardiology Office Note   Date:  01/26/2016   ID:  [name], DOB 01-06-1931, MRN [id number]  PCP:  [name], MD  Cardiologist:   Dorris Carnes, MD  Chief Complaint  Patient presents with  . Blood Pressure Management      History of Present Illness: [name] is a 82 y.o. male with a history of CAD s/p stent to RCA and cutting balloon angioplasty to the Dx, chronic chest pain, chronic dysphagia s/p dilations x2, HTN, HLD, and BPH.  He was admitted to the hospital in January for CP.  Losartan 25 mg was started for better BP control.  At his visit with Dr. Harrington Challenger on 3/9, his BP was controlled but patient complained of dizziness.  She stopped the losartan and decrease amlodipine from 10mg  to 5mg  with plans for patient to follow up today.   Pt is here with his wife.  He states he continues to have dizziness and this is something that he has just dealt with for years.  Pt is somewhat of a difficult historian but it seems to be somewhat orthostatic, although he states it can happen at any time.  He has not had any syncopal episodes.  He believes it is related to medications because all of his medications list dizziness as a side effect.  He has not been checking his BP at home on a regular basis.  He does have a machine and brought it in today.  Checked his machine and it was within a few mmHg of the manual reading.    Pt also described an episode of chest pain yesterday that required him to take 2 nitroglycerin.  Pt states this was different from his previous pains as it was lower in his chest and did not radiate upward.  No diaphoresis or nausea.  Stress test in January was no different than previous studies.   Current BP meds: amlodipine 5mg  daily, metoprolol 25mg  BID, doxazosin 1mg  daily, isosorbide 90mg  daily, furosemide 40mg  prn.   BP goal < 150/90  BP Readings from Last 3 Encounters:  01/26/16 136/74  12/24/15 124/62  11/15/15 140/76   Pulse Readings from Last 3  Encounters:  01/26/16 68  12/24/15 68  11/15/15 65    Outpatient Prescriptions Prior to Visit  Medication Sig Dispense Refill  . acetaminophen (TYLENOL) 325 MG tablet Take 2 tablets (650 mg total) by mouth every 4 (four) hours as needed for headache or mild pain.    Marland Kitchen amLODipine (NORVASC) 10 MG tablet Take 0.5 tablets (5 mg total) by mouth daily. 30 tablet 2  . aspirin EC 81 MG tablet Take 1 tablet (81 mg total) by mouth daily.    Marland Kitchen atorvastatin (LIPITOR) 40 MG tablet TAKE 1 TABLET BY MOUTH AT BEDTIME FOR CHOLESTEROL 90 tablet 3  . clopidogrel (PLAVIX) 75 MG tablet TAKE 1 TABLET BY MOUTH ONCE DAILY. 90 tablet 1  . cyclobenzaprine (FLEXERIL) 5 MG tablet Take 7.5 mg by mouth 3 (three) times daily.     Marland Kitchen doxazosin (CARDURA) 1 MG tablet Take 1 tablet (1 mg total) by mouth daily. 30 tablet 11  . fluocinonide gel (LIDEX) [id number] % Apply 1 application topically 2 (two) times daily.     . furosemide (LASIX) 40 MG tablet Take 1 tablet (40 mg total) by mouth daily as needed for edema. 30 tablet 5  . HYDROcodone-acetaminophen (NORCO) 7.5-325 MG per tablet Take 1 tablet by mouth every 4 (four) hours as needed for moderate  pain. FOR PAIN    . isosorbide mononitrate (IMDUR) 60 MG 24 hr tablet TAKE  1 1/2 TABLET BY MOUTH DAILY 45 tablet 11  . metoprolol tartrate (LOPRESSOR) 25 MG tablet TAKE 1 TABLET BY MOUTH TWICE DAILY 180 tablet 3  . nitroGLYCERIN (NITROSTAT) 0.4 MG SL tablet Place 0.4 mg under the tongue every 5 (five) minutes as needed for chest pain (x 3 pills daily).    . pantoprazole (PROTONIX) 40 MG tablet TAKE 1 TABLET BY MOUTH TWICE DAILY FOR REFLUX. 60 tablet 11  . tamsulosin (FLOMAX) 0.4 MG CAPS capsule Take 0.4 mg by mouth daily.     Marland Kitchen ZETIA 10 MG tablet TAKE (1/2) TABLET BY MOUTH ONCE DAILY. 45 tablet 2   No facility-administered medications prior to visit.     Allergies:   Budesonide-formoterol fumarate; Iohexol; Simvastatin; Demerol; Ivp dye; Meperidine hcl; Tape; Naproxen; Pregabalin;  and Sulfonamide derivatives   Past Medical History  Diagnosis Date  . Hypertension   . Hyperlipidemia   . Chronic chest pain   . GERD (gastroesophageal reflux disease)     h/o esophageal spasm  . Asthma   . Coronary artery disease     a. s/p BMS to RCA in 2002; b. s/p cutting balloon POBA ;   c. cath 6/12: oDx 80% (treated with repeat cutting balloon POBA), mLAD 50% with 30-40% at Dx, CFX 30%, pRCA 25% with patent stents;  d.  Lex MV 4/14:  Low Risk - EF 61%, inf scar with peri-infarct ischemia  . Myocardial infarction ([place]) 2001    x 1, confirned 1 possible  . Dyspnea     chronic  . History of blood transfusion     "related to OR"  . History of hiatal hernia   . Headache   . Arthritis     "hips, back" (06/17/2015)  . Chronic lower back pain   . Melanoma of lower back ([place]) late 1990's  . Memory loss     Past Surgical History  Procedure Laterality Date  . Cervical disc surgery  multiple  . Back surgery  multiple  . Laminectomy    . Posterior laminectomy / decompression cervical spine    . Anterior cervical decomp/discectomy fusion    . Esophagogastroduodenoscopy  03/23/2012    Procedure: ESOPHAGOGASTRODUODENOSCOPY (EGD);  Surgeon: Winfield Cunas., MD;  Location: [facility];  Service: Endoscopy;  Laterality: N/A;  . Savory dilation  03/23/2012    Procedure: SAVORY DILATION;  Surgeon: Winfield Cunas., MD;  Location: [facility];  Service: Endoscopy;  Laterality: N/A;  . Esophagogastroduodenoscopy N/A 03/04/2014    Procedure: ESOPHAGOGASTRODUODENOSCOPY (EGD);  Surgeon: Arta Silence, MD;  Location: [facility] ENDOSCOPY;  Service: Endoscopy;  Laterality: N/A;  . Cataract extraction w/ intraocular lens  implant, bilateral Bilateral   . Colonoscopy with propofol N/A 04/17/2014    Procedure: COLONOSCOPY WITH PROPOFOL;  Surgeon: Winfield Cunas., MD;  Location: [facility] ENDOSCOPY;  Service: Endoscopy;  Laterality: N/A;  . Left heart catheterization with coronary angiogram N/A 01/13/2014     Procedure: LEFT HEART CATHETERIZATION WITH CORONARY ANGIOGRAM;  Surgeon: Blane Ohara, MD;  Location: [facility] CATH LAB;  Service: Cardiovascular;  Laterality: N/A;  . Tonsillectomy  1930's  . Cardiac catheterization      "I've had 17 caths" (06/17/2015)  . Coronary angioplasty with stent placement  x 2 stents     previous percutaneous intervention on the  RCA and the diagonal branch  . Melanoma excision  late 1990's    "  lower back"     Social History:  The patient  reports that he has never smoked. He has never used smokeless tobacco. He reports that he does not drink alcohol or use illicit drugs.   Family History:  The patient's family history includes Asthma in his father; Colitis in his son; Colon cancer in his son; Crohn's disease in his son; Diabetes in his father; Heart disease in his father and mother; Prostate cancer in his paternal grandfather.    ASSESSMENT AND PLAN:  1.  Dizziness- Pt's BP improved with medication adjustments.  Unfortunately this did not improve his dizziness.  May or may not be related to BP.  Will have patient start checking his BP during these events at home to see what his BP is.  Will also stop doxazosin as this is most likely medication to cause orthostatic hypotension and will be okay if BP increases some.  Plan to recheck in 2 weeks.   2. Chest pain- pt was educated on approrpriate use of NTG and to call 911 when he has to take the 3rd NTG.  This episode seemed to be atypical from his previous.  Pt even stated this did not feel the same.  No further episodes noted.  CP work up in hospital in January was negative.  Pt aware to call the office if anything changes or pains become frequent.    Cyndee Brightly [facility]  01/26/2016 10:35 AM    [facility] [place], [place], [place]  [address] Phone: [phone]; Fax: [phone]

## 2016-01-26 NOTE — Patient Instructions (Signed)
Take 1/2 tablet of your doxazosin for 3 days then stop.   Continue your other medications.  If you have to take your nitroglycerin tablets more than once per week please call the office.   Follow up with blood pressure clinic in 2 weeks.

## 2016-02-09 ENCOUNTER — Ambulatory Visit: Payer: Medicare Other | Admitting: Pharmacist

## 2016-02-15 ENCOUNTER — Other Ambulatory Visit: Payer: Self-pay | Admitting: Physician Assistant

## 2016-04-07 ENCOUNTER — Other Ambulatory Visit: Payer: Self-pay | Admitting: Internal Medicine

## 2016-06-06 ENCOUNTER — Other Ambulatory Visit: Payer: Self-pay | Admitting: Internal Medicine

## 2016-06-06 MED ORDER — ISOSORBIDE MONONITRATE ER 60 MG PO TB24: ORAL_TABLET | ORAL | 6 refills | Status: DC

## 2016-09-05 ENCOUNTER — Other Ambulatory Visit: Payer: Self-pay | Admitting: Internal Medicine

## 2016-09-06 NOTE — Telephone Encounter (Signed)
Medication Detail    Disp Refills Start End   clopidogrel (PLAVIX) 75 MG tablet 90 tablet 2 04/07/2016    Sig: TAKE 1 TABLET BY MOUTH ONCE DAILY.   E-Prescribing Status: Receipt confirmed by pharmacy (04/07/2016 10:52 AM EDT)   [place], [place]

## 2016-09-15 ENCOUNTER — Other Ambulatory Visit: Payer: Self-pay | Admitting: Internal Medicine

## 2016-10-04 ENCOUNTER — Other Ambulatory Visit: Payer: Self-pay | Admitting: *Deleted

## 2016-10-04 MED ORDER — DOXAZOSIN MESYLATE 1 MG PO TABS: 1.0000 mg | ORAL_TABLET | Freq: Every day | ORAL | 0 refills | Status: DC

## 2016-12-01 ENCOUNTER — Other Ambulatory Visit: Payer: Self-pay | Admitting: Internal Medicine

## 2017-01-02 ENCOUNTER — Other Ambulatory Visit: Payer: Self-pay | Admitting: Internal Medicine

## 2017-01-03 ENCOUNTER — Emergency Department (HOSPITAL_COMMUNITY)
Admission: EM | Admit: 2017-01-03 | Discharge: 2017-01-03 | Disposition: A | Discharge status: Home / Self Care | Payer: Medicare Other | Attending: Emergency Medicine | Admitting: Emergency Medicine

## 2017-01-03 ENCOUNTER — Encounter (HOSPITAL_COMMUNITY): Payer: Self-pay | Admitting: Family Medicine

## 2017-01-03 ENCOUNTER — Emergency Department (HOSPITAL_COMMUNITY): Payer: Medicare Other

## 2017-01-03 DIAGNOSIS — Z955 Presence of coronary angioplasty implant and graft: Secondary | ICD-10-CM | POA: Insufficient documentation

## 2017-01-03 DIAGNOSIS — I252 Old myocardial infarction: Secondary | ICD-10-CM | POA: Insufficient documentation

## 2017-01-03 DIAGNOSIS — I10 Essential (primary) hypertension: Secondary | ICD-10-CM | POA: Diagnosis not present

## 2017-01-03 DIAGNOSIS — I200 Unstable angina: Secondary | ICD-10-CM | POA: Diagnosis not present

## 2017-01-03 DIAGNOSIS — R079 Chest pain, unspecified: Secondary | ICD-10-CM | POA: Diagnosis present

## 2017-01-03 DIAGNOSIS — I2089 Other forms of angina pectoris: Secondary | ICD-10-CM

## 2017-01-03 DIAGNOSIS — Z7982 Long term (current) use of aspirin: Secondary | ICD-10-CM | POA: Insufficient documentation

## 2017-01-03 DIAGNOSIS — I208 Other forms of angina pectoris: Secondary | ICD-10-CM

## 2017-01-03 DIAGNOSIS — J45909 Unspecified asthma, uncomplicated: Secondary | ICD-10-CM | POA: Diagnosis not present

## 2017-01-03 DIAGNOSIS — Z79899 Other long term (current) drug therapy: Secondary | ICD-10-CM | POA: Diagnosis not present

## 2017-01-03 LAB — BASIC METABOLIC PANEL
Anion gap: 6 (ref 5–15)
BUN: 8 mg/dL (ref 6–20)
CHLORIDE: 107 mmol/L (ref 101–111)
CO2: 27 mmol/L (ref 22–32)
CREATININE: 0.98 mg/dL (ref 0.61–1.24)
Calcium: 8.9 mg/dL (ref 8.9–10.3)
GFR calc Af Amer: >60 mL/min (ref >60)
GLUCOSE: 129 mg/dL — AB (ref 65–99)
POTASSIUM: 3.3 mmol/L — AB (ref 3.5–5.1)
Sodium: 140 mmol/L (ref 135–145)

## 2017-01-03 LAB — I-STAT TROPONIN, ED
Troponin i, poc: 0.01 ng/mL (ref 0.00–0.08)
Troponin i, poc: 0.02 ng/mL (ref 0.00–0.08)

## 2017-01-03 LAB — CBC
HEMATOCRIT: 30.3 % — AB (ref 39.0–52.0)
Hemoglobin: 9.9 g/dL — ABNORMAL LOW (ref 13.0–17.0)
MCH: 29.6 pg (ref 26.0–34.0)
MCHC: 32.7 g/dL (ref 30.0–36.0)
MCV: 90.4 fL (ref 78.0–100.0)
PLATELETS: 183 10*3/uL (ref 150–400)
RBC: 3.35 MIL/uL — ABNORMAL LOW (ref 4.22–5.81)
RDW: 13.8 % (ref 11.5–15.5)
WBC: 7.8 10*3/uL (ref 4.0–10.5)

## 2017-01-03 NOTE — ED Provider Notes (Signed)
[facility] DEPT Provider Note   CSN: [id number] Arrival date & time: 01/03/17  1419     History   Chief Complaint Chief Complaint  Patient presents with  . Chest Pain    HPI [name] is a 83 y.o. male.  He presents for evaluation of chest pain, which started at noon today, spontaneously.  The chest pain resolved after he took 3 baby aspirin and 3 sublingual nitroglycerin.  The pain was typical cold but worse than his usual chest pain, so he wanted to be seen today.  He states that the pain felt like his "heart attack", several years ago.  He tends to use nitroglycerin several times each month, for similar pain.  He describes the pain as a tight feeling.  He denies cough shortness of breath nausea vomiting weakness or dizziness.  No other recent illnesses.  There are no other known modifying factors.  HPI  Past Medical History:  Diagnosis Date  . Arthritis    "hips, back" (06/17/2015)  . Asthma   . Chronic chest pain   . Chronic lower back pain   . Coronary artery disease    a. s/p BMS to RCA in 2002; b. s/p cutting balloon POBA ;   c. cath 6/12: oDx 80% (treated with repeat cutting balloon POBA), mLAD 50% with 30-40% at Dx, CFX 30%, pRCA 25% with patent stents;  d.  Lex MV 4/14:  Low Risk - EF 61%, inf scar with peri-infarct ischemia  . Dyspnea    chronic  . GERD (gastroesophageal reflux disease)    h/o esophageal spasm  . Headache   . History of blood transfusion    "related to OR"  . History of hiatal hernia   . Hyperlipidemia   . Hypertension   . Melanoma of lower back ([place]) late 1990's  . Memory loss   . Myocardial infarction 2001   x 1, confirned 1 possible    Patient Active Problem List   Diagnosis Date Noted  . Coronary artery disease due to lipid rich plaque   . Chest pain 11/14/2015  . Unstable angina ([place]) 11/14/2015  . Essential hypertension   . Coronary artery disease involving native coronary artery of native heart with angina pectoris ([place])    . Bladder outlet obstruction   . Hypertensive urgency 06/17/2015  . GI bleed 03/03/2014  . Anemia 03/03/2014  . Generalized weakness 03/03/2014  . Chest pain, mid sternal 01/18/2013  . Chest pain with moderate risk of acute coronary syndrome 04/16/2012  . ESOPHAGEAL STRICTURE 09/20/2010  . ORTHOSTATIC DIZZINESS 08/30/2010  . IBS 08/26/2010  . RECTAL BLEEDING 08/26/2010  . RECTAL PAIN 08/26/2010  . ARTHRITIS 08/26/2010  . LOSS OF APPETITE 08/26/2010  . NAUSEA AND VOMITING 08/26/2010  . OTHER DYSPHAGIA 08/26/2010  . Abdominal pain, generalized 08/26/2010  . COLONIC POLYPS, HX OF 08/26/2010  . ANAL FISSURE, HX OF 08/26/2010  . PALPITATIONS, HX OF 08/18/2010  . PERIPHERAL NEUROPATHY 05/01/2009  . MYOCARDIAL INFARCTION 05/01/2009  . ALLERGIC RHINITIS, SEASONAL 05/01/2009  . DEPRESSION, HX OF 05/01/2009  . Personal history of other diseases of digestive system 05/01/2009  . NEPHROLITHIASIS, HX OF 05/01/2009  . BENIGN PROSTATIC HYPERTROPHY, HX OF 05/01/2009  . LAMINECTOMY, HX OF 05/01/2009  . Hyperlipidemia 10/20/2008  . ESSENTIAL HYPERTENSION 10/20/2008  . CAD- RCA BMS '02, Dx HSRA June 2012 10/20/2008  . ASTHMA, UNSPECIFIED, UNSPECIFIED STATUS 10/20/2008  . GERD 10/20/2008  . DYSPNEA 10/20/2008    Past Surgical History:  Procedure Laterality  Date  . ANTERIOR CERVICAL DECOMP/DISCECTOMY FUSION    . BACK SURGERY  multiple  . CARDIAC CATHETERIZATION     "I've had 17 caths" (06/17/2015)  . CATARACT EXTRACTION W/ INTRAOCULAR LENS  IMPLANT, BILATERAL Bilateral   . CERVICAL DISC SURGERY  multiple  . COLONOSCOPY WITH PROPOFOL N/A 04/17/2014   Procedure: COLONOSCOPY WITH PROPOFOL;  Surgeon: Winfield Cunas., MD;  Location: [facility] ENDOSCOPY;  Service: Endoscopy;  Laterality: N/A;  . CORONARY ANGIOPLASTY WITH STENT PLACEMENT  x 2 stents    previous percutaneous intervention on the  RCA and the diagonal branch  . ESOPHAGOGASTRODUODENOSCOPY  03/23/2012   Procedure:  ESOPHAGOGASTRODUODENOSCOPY (EGD);  Surgeon: Winfield Cunas., MD;  Location: [facility];  Service: Endoscopy;  Laterality: N/A;  . ESOPHAGOGASTRODUODENOSCOPY N/A 03/04/2014   Procedure: ESOPHAGOGASTRODUODENOSCOPY (EGD);  Surgeon: Arta Silence, MD;  Location: [facility] ENDOSCOPY;  Service: Endoscopy;  Laterality: N/A;  . LAMINECTOMY    . LEFT HEART CATHETERIZATION WITH CORONARY ANGIOGRAM N/A 01/13/2014   Procedure: LEFT HEART CATHETERIZATION WITH CORONARY ANGIOGRAM;  Surgeon: Blane Ohara, MD;  Location: [facility] CATH LAB;  Service: Cardiovascular;  Laterality: N/A;  . MELANOMA EXCISION  late 1990's   "lower back"  . POSTERIOR LAMINECTOMY / DECOMPRESSION CERVICAL SPINE    . SAVORY DILATION  03/23/2012   Procedure: SAVORY DILATION;  Surgeon: Winfield Cunas., MD;  Location: [facility] ENDOSCOPY;  Service: Endoscopy;  Laterality: N/A;  . TONSILLECTOMY  1930's       Home Medications    Prior to Admission medications   Medication Sig Start Date End Date Taking? Authorizing Provider  aspirin EC 81 MG tablet Take 1 tablet (81 mg total) by mouth daily. 03/08/14  Yes Maryann Mikhail, DO  atorvastatin (LIPITOR) 40 MG tablet TAKE 1 TABLET BY MOUTH AT BEDTIME FOR CHOLESTEROL 12/01/16  Yes Fay Records, MD  clopidogrel (PLAVIX) 75 MG tablet TAKE 1 TABLET BY MOUTH ONCE DAILY. 01/02/17  Yes Fay Records, MD  cyclobenzaprine (FLEXERIL) 5 MG tablet Take 7.5 mg by mouth 3 (three) times daily.    Yes Historical Provider, MD  doxazosin (CARDURA) 1 MG tablet Take 1 tablet (1 mg total) by mouth daily. 10/04/16  Yes Eileen Stanford, PA-C  fluocinonide gel (LIDEX) 5.32 % Apply 1 application topically 2 (two) times daily.  11/12/15  Yes Historical Provider, MD  HYDROcodone-acetaminophen (NORCO) 7.5-325 MG per tablet Take 1 tablet by mouth every 4 (four) hours as needed for moderate pain. FOR PAIN 06/05/14  Yes Historical Provider, MD  isosorbide mononitrate (IMDUR) 60 MG 24 hr tablet TAKE 1 1/2 TABLET BY MOUTH DAILY 01/02/17   Yes Fay Records, MD  isosorbide mononitrate (IMDUR) 60 MG 24 hr tablet Take 90 mg by mouth daily.   Yes Historical Provider, MD  metoprolol tartrate (LOPRESSOR) 25 MG tablet TAKE 1 TABLET BY MOUTH TWICE DAILY 12/01/16  Yes Fay Records, MD  pantoprazole (PROTONIX) 40 MG tablet TAKE 1 TABLET BY MOUTH TWICE DAILY 04/07/16  Yes Fay Records, MD  tamsulosin (FLOMAX) 0.4 MG CAPS capsule Take 0.4 mg by mouth daily.  01/13/15  Yes Historical Provider, MD  acetaminophen (TYLENOL) 325 MG tablet Take 2 tablets (650 mg total) by mouth every 4 (four) hours as needed for headache or mild pain. Patient not taking: Reported on 01/03/2017 12/16/13   Erlene Quan, PA-C  amLODipine (NORVASC) 10 MG tablet Take 0.5 tablets (5 mg total) by mouth daily. 12/24/15   Fay Records, MD  furosemide (LASIX) 40 MG tablet TAKE 1 TABLET BY MOUTH ONCE DAILY AS NEEDED FOR EDEMA 02/16/16   Liliane Shi, PA-C  nitroGLYCERIN (NITROSTAT) 0.4 MG SL tablet Place 0.4 mg under the tongue every 5 (five) minutes as needed for chest pain (x 3 pills daily).    Historical Provider, MD  nitroGLYCERIN (NITROSTAT) 0.4 MG SL tablet Place 1 tablet (0.4 mg total) under the tongue every 5 (five) minutes as needed for chest pain. 09/15/16   Fay Records, MD  ZETIA 10 MG tablet TAKE (1/2) TABLET BY MOUTH ONCE DAILY. Patient not taking: Reported on 01/03/2017 11/06/15   Fay Records, MD    Family History Family History  Problem Relation Age of Onset  . Diabetes Father   . Heart disease Father   . Asthma Father   . Heart disease Mother     CABG hx age 5  . Colon cancer Son     hx   . Colitis Son     hx  . Crohn's disease Son   . Prostate cancer Paternal Grandfather     Social History Social History  Substance Use Topics  . Smoking status: Never Smoker  . Smokeless tobacco: Never Used  . Alcohol use No     Allergies   Budesonide-formoterol fumarate; Iohexol; Simvastatin; Demerol [meperidine]; Ivp dye [iodinated diagnostic agents];  Meperidine hcl; Tape; Naproxen; Pregabalin; and Sulfonamide derivatives   Review of Systems Review of Systems  All other systems reviewed and are negative.    Physical Exam Updated Vital Signs BP (!) 163/72   Pulse (!) 55   Temp 98 F (36.7 C) (Oral)   Resp 17   SpO2 99%   Physical Exam  Constitutional: He is oriented to person, place, and time. He appears well-developed.  Elderly, frail  HENT:  Head: Normocephalic and atraumatic.  Right Ear: External ear normal.  Left Ear: External ear normal.  Eyes: Conjunctivae and EOM are normal. Pupils are equal, round, and reactive to light.  Neck: Normal range of motion and phonation normal. Neck supple.  Cardiovascular: Normal rate, regular rhythm and normal heart sounds.   Pulmonary/Chest: Effort normal and breath sounds normal. He exhibits no bony tenderness.  Abdominal: Soft. There is no tenderness.  Musculoskeletal: Normal range of motion.  Somewhat limited neck motion both with lateral rotation and flexion secondary to pain in the posterior neck.  This did not cause pain into the left shoulder or chest.  Neurological: He is alert and oriented to person, place, and time. No cranial nerve deficit or sensory deficit. He exhibits normal muscle tone. Coordination normal.  Skin: Skin is warm, dry and intact.  Psychiatric: He has a normal mood and affect. His behavior is normal.  Nursing note and vitals reviewed.    ED Treatments / Results  Labs (all labs ordered are listed, but only abnormal results are displayed) Labs Reviewed  BASIC METABOLIC PANEL - Abnormal; Notable for the following:       Result Value   Potassium 3.3 (*)    Glucose, Bld 129 (*)    All other components within normal limits  CBC - Abnormal; Notable for the following:    RBC 3.35 (*)    Hemoglobin 9.9 (*)    HCT 30.3 (*)    All other components within normal limits  Randolm Idol, ED  Randolm Idol, ED    EKG  EKG  Interpretation  Date/Time:  Tuesday January 03 2017 14:22:40 EDT Ventricular Rate:  56  PR Interval:    QRS Duration: 87 QT Interval:  449 QTC Calculation: 434 R Axis:   57 Text Interpretation:  Sinus rhythm since last tracing no significant change Confirmed by Eulis Foster  MD, Vira Agar ([id number]) on 01/03/2017 2:29:30 PM Also confirmed by Eulis Foster  MD, Rhianne Soman [phone]), editor [address] [place], Leda Gauze [phone])  on 01/03/2017 3:07:22 PM       Radiology Dg Chest 2 View  Result Date: 01/03/2017 CLINICAL DATA:  Left side chest pain for 3 hours EXAM: CHEST  2 VIEW COMPARISON:  11/14/2015 FINDINGS: Cardiomediastinal silhouette is stable. No infiltrate or pleural effusion. No pulmonary edema. Mild degenerative changes mid and lower thoracic spine. IMPRESSION: No active cardiopulmonary disease. Electronically Signed   By: Lahoma Crocker M.D.   On: 01/03/2017 15:15    Procedures Procedures (including critical care time)  Medications Ordered in ED Medications - No data to display   Initial Impression / Assessment and Plan / ED Course  I have reviewed the triage vital signs and the nursing notes.  Pertinent labs & imaging results that were available during my care of the patient were reviewed by me and considered in my medical decision making (see chart for details).  Clinical Course as of Jan 04 1812  Tue Jan 03, 2017  1440 He presents for evaluation of chest pain which began around noon today, and resolved after he took 3 baby aspirin and 3 sublingual nitroglycerin.  He was worried that it felt like his "heart attack", which he had several years ago.  He takes nitroglycerin occasionally at home for pain similar to this, but not quite so bad, as it was today.  [EW]    Clinical Course User Index [EW] Daleen Bo, MD    Medications - No data to display  Patient Vitals for the past 24 hrs:  BP Temp Temp src Pulse Resp SpO2  01/03/17 1700 (!) 163/72 - - - 17 -  01/03/17 1645 (!) 117/96 - - - 19 -  01/03/17 1630  140/68 - - - (!) 21 -  01/03/17 1600 (!) 159/106 - - - (!) 21 -  01/03/17 1545 (!) 151/92 - - (!) 55 15 99 %  01/03/17 1423 (!) 173/70 98 F (36.7 C) Oral (!) 56 16 99 %    5:42 PM Reevaluation with update and discussion. After initial assessment and treatment, an updated evaluation reveals second troponin is still pending.  Patient has remained comfortable with reassuring vital signs.  Patient denies chest pain and is hungry at this time.  Patient and family updated on findings. [place] Kappes L    Final Clinical Impressions(s) / ED Diagnoses   Final diagnoses:  Angina at rest [facility])    Chest pain similar to typical angina.  No evidence for abnormal EKG or infarct on initial troponin screening.  Delta troponin ordered.  Patient remained comfortable during evaluation.  Nursing Notes Reviewed/ Care Coordinated Applicable Imaging Reviewed Interpretation of Laboratory Data incorporated into ED treatment  The patient appears reasonably screened and/or stabilized for discharge and I doubt any other medical condition or other [facility] requiring further screening, evaluation, or treatment in the ED at this time prior to discharge.  Plan: Home Medications-continue usual medications; Home Treatments-rest; return here if the recommended treatment, does not improve the symptoms; Recommended follow up-PCP checkup 1 week   New Prescriptions New Prescriptions   No medications on file     Daleen Bo, MD 01/03/17 1814

## 2017-01-03 NOTE — Discharge Instructions (Signed)
The testing today was reassuring, there is no evidence that you have had any damage to your heart today.  Your symptoms are likely related to your angina which you have had previously.  It is important to continue taking your aspirin every day and using nitroglycerin if needed for chest pain.  Follow-up with your primary care doctor for checkup, in 3 days, or see your cardiologist.

## 2017-01-03 NOTE — ED Triage Notes (Signed)
Pt presents from home via [facility] with c/o left sided CP that began around 12:00 today on left side of chest without radiation. Pt took 3SL nitro and 324mg  ASA prior to EMS arrival and has been pain free since. A&Ox4 in NAD.

## 2017-02-01 ENCOUNTER — Other Ambulatory Visit: Payer: Self-pay | Admitting: Internal Medicine

## 2017-03-02 ENCOUNTER — Other Ambulatory Visit: Payer: Self-pay | Admitting: Internal Medicine

## 2017-03-02 ENCOUNTER — Other Ambulatory Visit: Payer: Self-pay | Admitting: Cardiovascular Disease

## 2017-06-01 ENCOUNTER — Other Ambulatory Visit: Payer: Self-pay | Admitting: Cardiovascular Disease

## 2017-06-01 ENCOUNTER — Other Ambulatory Visit: Payer: Self-pay | Admitting: Internal Medicine

## 2017-06-18 ENCOUNTER — Emergency Department (HOSPITAL_COMMUNITY): Payer: Medicare Other

## 2017-06-18 ENCOUNTER — Encounter (HOSPITAL_COMMUNITY): Payer: Self-pay | Admitting: Emergency Medicine

## 2017-06-18 ENCOUNTER — Observation Stay (HOSPITAL_COMMUNITY)
Admission: EM | Admit: 2017-06-18 | Discharge: 2017-06-20 | Disposition: A | Discharge status: Home / Self Care | Payer: Medicare Other | Attending: Internal Medicine | Admitting: Internal Medicine

## 2017-06-18 DIAGNOSIS — F329 Major depressive disorder, single episode, unspecified: Secondary | ICD-10-CM | POA: Insufficient documentation

## 2017-06-18 DIAGNOSIS — G9340 Encephalopathy, unspecified: Principal | ICD-10-CM

## 2017-06-18 DIAGNOSIS — I10 Essential (primary) hypertension: Secondary | ICD-10-CM | POA: Diagnosis not present

## 2017-06-18 DIAGNOSIS — Z882 Allergy status to sulfonamides status: Secondary | ICD-10-CM | POA: Diagnosis not present

## 2017-06-18 DIAGNOSIS — I700 Atherosclerosis of aorta: Secondary | ICD-10-CM | POA: Insufficient documentation

## 2017-06-18 DIAGNOSIS — Z7902 Long term (current) use of antithrombotics/antiplatelets: Secondary | ICD-10-CM | POA: Insufficient documentation

## 2017-06-18 DIAGNOSIS — K449 Diaphragmatic hernia without obstruction or gangrene: Secondary | ICD-10-CM | POA: Diagnosis not present

## 2017-06-18 DIAGNOSIS — E538 Deficiency of other specified B group vitamins: Secondary | ICD-10-CM | POA: Diagnosis not present

## 2017-06-18 DIAGNOSIS — I2510 Atherosclerotic heart disease of native coronary artery without angina pectoris: Secondary | ICD-10-CM | POA: Insufficient documentation

## 2017-06-18 DIAGNOSIS — R410 Disorientation, unspecified: Secondary | ICD-10-CM | POA: Diagnosis present

## 2017-06-18 DIAGNOSIS — J45909 Unspecified asthma, uncomplicated: Secondary | ICD-10-CM | POA: Diagnosis not present

## 2017-06-18 DIAGNOSIS — K219 Gastro-esophageal reflux disease without esophagitis: Secondary | ICD-10-CM | POA: Insufficient documentation

## 2017-06-18 DIAGNOSIS — E785 Hyperlipidemia, unspecified: Secondary | ICD-10-CM | POA: Diagnosis not present

## 2017-06-18 DIAGNOSIS — Z79899 Other long term (current) drug therapy: Secondary | ICD-10-CM | POA: Insufficient documentation

## 2017-06-18 DIAGNOSIS — Z7982 Long term (current) use of aspirin: Secondary | ICD-10-CM | POA: Diagnosis not present

## 2017-06-18 DIAGNOSIS — D649 Anemia, unspecified: Secondary | ICD-10-CM | POA: Diagnosis not present

## 2017-06-18 DIAGNOSIS — Z85820 Personal history of malignant melanoma of skin: Secondary | ICD-10-CM | POA: Insufficient documentation

## 2017-06-18 DIAGNOSIS — Z955 Presence of coronary angioplasty implant and graft: Secondary | ICD-10-CM | POA: Insufficient documentation

## 2017-06-18 DIAGNOSIS — I252 Old myocardial infarction: Secondary | ICD-10-CM | POA: Insufficient documentation

## 2017-06-18 DIAGNOSIS — I25119 Atherosclerotic heart disease of native coronary artery with unspecified angina pectoris: Secondary | ICD-10-CM | POA: Diagnosis present

## 2017-06-18 DIAGNOSIS — M1990 Unspecified osteoarthritis, unspecified site: Secondary | ICD-10-CM | POA: Diagnosis not present

## 2017-06-18 DIAGNOSIS — I119 Hypertensive heart disease without heart failure: Secondary | ICD-10-CM | POA: Diagnosis not present

## 2017-06-18 DIAGNOSIS — I493 Ventricular premature depolarization: Secondary | ICD-10-CM | POA: Diagnosis not present

## 2017-06-18 DIAGNOSIS — Z66 Do not resuscitate: Secondary | ICD-10-CM | POA: Insufficient documentation

## 2017-06-18 LAB — BASIC METABOLIC PANEL
ANION GAP: 9 (ref 5–15)
BUN: 19 mg/dL (ref 6–20)
CHLORIDE: 102 mmol/L (ref 101–111)
CO2: 26 mmol/L (ref 22–32)
CREATININE: 1.14 mg/dL (ref 0.61–1.24)
Calcium: 9.6 mg/dL (ref 8.9–10.3)
GFR calc non Af Amer: 57 mL/min — ABNORMAL LOW (ref >60)
Glucose, Bld: 104 mg/dL — ABNORMAL HIGH (ref 65–99)
POTASSIUM: 3.8 mmol/L (ref 3.5–5.1)
SODIUM: 137 mmol/L (ref 135–145)

## 2017-06-18 LAB — URINALYSIS, ROUTINE W REFLEX MICROSCOPIC
Bacteria, UA: NONE SEEN
Bilirubin Urine: NEGATIVE
Glucose, UA: NEGATIVE mg/dL
Ketones, ur: NEGATIVE mg/dL
Leukocytes, UA: NEGATIVE
Nitrite: NEGATIVE
Protein, ur: NEGATIVE mg/dL
Specific Gravity, Urine: 1.024 (ref 1.005–1.030)
WBC, UA: NONE SEEN WBC/hpf (ref 0–5)
pH: 5 (ref 5.0–8.0)

## 2017-06-18 LAB — CBC
HCT: 37.8 % — ABNORMAL LOW (ref 39.0–52.0)
Hemoglobin: 12.5 g/dL — ABNORMAL LOW (ref 13.0–17.0)
MCH: 29.7 pg (ref 26.0–34.0)
MCHC: 33.1 g/dL (ref 30.0–36.0)
MCV: 89.8 fL (ref 78.0–100.0)
PLATELETS: 260 10*3/uL (ref 150–400)
RBC: 4.21 MIL/uL — ABNORMAL LOW (ref 4.22–5.81)
RDW: 14.1 % (ref 11.5–15.5)
WBC: 6.5 10*3/uL (ref 4.0–10.5)

## 2017-06-18 LAB — RAPID URINE DRUG SCREEN, HOSP PERFORMED
AMPHETAMINES: NOT DETECTED
BARBITURATES: NOT DETECTED
Benzodiazepines: NOT DETECTED
Cocaine: NOT DETECTED
Opiates: POSITIVE — AB
TETRAHYDROCANNABINOL: NOT DETECTED

## 2017-06-18 LAB — TSH: TSH: 0.974 u[IU]/mL (ref 0.350–4.500)

## 2017-06-18 LAB — ETHANOL

## 2017-06-18 LAB — TROPONIN I: TROPONIN I: 0.03 ng/mL — AB (ref <0.03)

## 2017-06-18 MED ORDER — ACETAMINOPHEN 325 MG PO TABS: 650.0000 mg | ORAL_TABLET | Freq: Four times a day (QID) | ORAL | Status: DC | PRN

## 2017-06-18 MED ORDER — ASPIRIN EC 81 MG PO TBEC
81.0000 mg | DELAYED_RELEASE_TABLET | Freq: Every day | ORAL | Status: DC
  Administered 2017-06-19 – 2017-06-20 (×2): 81 mg via ORAL
  Filled 2017-06-18 (×2): qty 1

## 2017-06-18 MED ORDER — PANTOPRAZOLE SODIUM 40 MG PO TBEC
40.0000 mg | DELAYED_RELEASE_TABLET | Freq: Two times a day (BID) | ORAL | Status: DC
  Administered 2017-06-19 – 2017-06-20 (×4): 40 mg via ORAL
  Filled 2017-06-18 (×4): qty 1

## 2017-06-18 MED ORDER — DOXAZOSIN MESYLATE 1 MG PO TABS
1.0000 mg | ORAL_TABLET | Freq: Every day | ORAL | Status: DC
  Administered 2017-06-19 – 2017-06-20 (×2): 1 mg via ORAL
  Filled 2017-06-18 (×2): qty 1

## 2017-06-18 MED ORDER — ATORVASTATIN CALCIUM 10 MG PO TABS
10.0000 mg | ORAL_TABLET | Freq: Every day | ORAL | Status: DC
  Administered 2017-06-19 – 2017-06-20 (×2): 10 mg via ORAL
  Filled 2017-06-18 (×2): qty 1

## 2017-06-18 MED ORDER — TAMSULOSIN HCL 0.4 MG PO CAPS
0.4000 mg | ORAL_CAPSULE | Freq: Every day | ORAL | Status: DC
  Administered 2017-06-19 – 2017-06-20 (×2): 0.4 mg via ORAL
  Filled 2017-06-18 (×2): qty 1

## 2017-06-18 MED ORDER — NITROGLYCERIN 0.4 MG SL SUBL: 0.4000 mg | SUBLINGUAL_TABLET | SUBLINGUAL | Status: DC | PRN

## 2017-06-18 MED ORDER — ACETAMINOPHEN 650 MG RE SUPP: 650.0000 mg | Freq: Four times a day (QID) | RECTAL | Status: DC | PRN

## 2017-06-18 MED ORDER — SODIUM CHLORIDE 0.9 % IV SOLN: INTRAVENOUS | Status: DC

## 2017-06-18 MED ORDER — CLOPIDOGREL BISULFATE 75 MG PO TABS
75.0000 mg | ORAL_TABLET | Freq: Every day | ORAL | Status: DC
  Administered 2017-06-19 – 2017-06-20 (×2): 75 mg via ORAL
  Filled 2017-06-18 (×2): qty 1

## 2017-06-18 MED ORDER — ONDANSETRON HCL 4 MG/2ML IJ SOLN: 4.0000 mg | Freq: Four times a day (QID) | INTRAMUSCULAR | Status: DC | PRN

## 2017-06-18 MED ORDER — METOPROLOL TARTRATE 25 MG PO TABS
25.0000 mg | ORAL_TABLET | Freq: Two times a day (BID) | ORAL | Status: DC
  Administered 2017-06-19 – 2017-06-20 (×4): 25 mg via ORAL
  Filled 2017-06-18 (×4): qty 1

## 2017-06-18 MED ORDER — HYDROCODONE-ACETAMINOPHEN 7.5-325 MG PO TABS
1.0000 | ORAL_TABLET | ORAL | Status: DC | PRN
  Administered 2017-06-19: 1 via ORAL
  Filled 2017-06-18 (×2): qty 1

## 2017-06-18 MED ORDER — ONDANSETRON HCL 4 MG PO TABS: 4.0000 mg | ORAL_TABLET | Freq: Four times a day (QID) | ORAL | Status: DC | PRN

## 2017-06-18 NOTE — ED Provider Notes (Signed)
[facility] DEPT Provider Note   CSN: [id number] Arrival date & time: 06/18/17  1658     History   Chief Complaint No chief complaint on file.   HPI [name] is a 83 y.o. male.  HPI  83 year old male presents with altered mental status History is taken from the son and the patient. The patient has not been feeling well for the last 3 or 4 days. The son last saw him about 3 days ago. He has been having progressive memory issues for the last several months. However he seemed worse a few days ago. Otherwise the son has a hard time describing exactly what he means by saying the patient is not quite himself. Today he went to check on him and the patient did not answer the door. He had uses key to get in and then had to wake him up. It took a little while to wake him up and when he woke him up he seemed more confused for about 5 minutes. Then improved but was still not quite himself. When EMS was called, the patient states that he was 83 years old (his birth year is [birth year]), and he said that Obama was president. At this time the patient is better according to the son but not quite himself. No fevers. No complaints by patient.  Past Medical History:  Diagnosis Date  . Arthritis    "hips, back" (06/17/2015)  . Asthma   . Chronic chest pain   . Chronic lower back pain   . Coronary artery disease    a. s/p BMS to RCA in 2002; b. s/p cutting balloon POBA ;   c. cath 6/12: oDx 80% (treated with repeat cutting balloon POBA), mLAD 50% with 30-40% at Dx, CFX 30%, pRCA 25% with patent stents;  d.  Lex MV 4/14:  Low Risk - EF 61%, inf scar with peri-infarct ischemia  . Dyspnea    chronic  . GERD (gastroesophageal reflux disease)    h/o esophageal spasm  . Headache   . History of blood transfusion    "related to OR"  . History of hiatal hernia   . Hyperlipidemia   . Hypertension   . Melanoma of lower back ([place]) late 1990's  . Memory loss   . Myocardial infarction ([place]) 2001   x 1,  confirned 1 possible    Patient Active Problem List   Diagnosis Date Noted  . Acute encephalopathy 06/18/2017  . Coronary artery disease due to lipid rich plaque   . Chest pain 11/14/2015  . Unstable angina ([place]) 11/14/2015  . Essential hypertension   . Coronary artery disease involving native coronary artery of native heart with angina pectoris ([place])   . Bladder outlet obstruction   . Hypertensive urgency 06/17/2015  . GI bleed 03/03/2014  . Anemia 03/03/2014  . Generalized weakness 03/03/2014  . Chest pain, mid sternal 01/18/2013  . Chest pain with moderate risk of acute coronary syndrome 04/16/2012  . ESOPHAGEAL STRICTURE 09/20/2010  . ORTHOSTATIC DIZZINESS 08/30/2010  . IBS 08/26/2010  . RECTAL BLEEDING 08/26/2010  . RECTAL PAIN 08/26/2010  . ARTHRITIS 08/26/2010  . LOSS OF APPETITE 08/26/2010  . NAUSEA AND VOMITING 08/26/2010  . OTHER DYSPHAGIA 08/26/2010  . Abdominal pain, generalized 08/26/2010  . COLONIC POLYPS, HX OF 08/26/2010  . ANAL FISSURE, HX OF 08/26/2010  . PALPITATIONS, HX OF 08/18/2010  . PERIPHERAL NEUROPATHY 05/01/2009  . MYOCARDIAL INFARCTION 05/01/2009  . ALLERGIC RHINITIS, SEASONAL 05/01/2009  . DEPRESSION,  HX OF 05/01/2009  . Personal history of other diseases of digestive system 05/01/2009  . NEPHROLITHIASIS, HX OF 05/01/2009  . BENIGN PROSTATIC HYPERTROPHY, HX OF 05/01/2009  . LAMINECTOMY, HX OF 05/01/2009  . Hyperlipidemia 10/20/2008  . ESSENTIAL HYPERTENSION 10/20/2008  . CAD- RCA BMS '02, Dx HSRA June 2012 10/20/2008  . ASTHMA, UNSPECIFIED, UNSPECIFIED STATUS 10/20/2008  . GERD 10/20/2008  . DYSPNEA 10/20/2008    Past Surgical History:  Procedure Laterality Date  . ANTERIOR CERVICAL DECOMP/DISCECTOMY FUSION    . BACK SURGERY  multiple  . CARDIAC CATHETERIZATION     "I've had 17 caths" (06/17/2015)  . CATARACT EXTRACTION W/ INTRAOCULAR LENS  IMPLANT, BILATERAL Bilateral   . CERVICAL DISC SURGERY  multiple  . COLONOSCOPY WITH PROPOFOL  N/A 04/17/2014   Procedure: COLONOSCOPY WITH PROPOFOL;  Surgeon: Winfield Cunas., MD;  Location: [facility] ENDOSCOPY;  Service: Endoscopy;  Laterality: N/A;  . CORONARY ANGIOPLASTY WITH STENT PLACEMENT  x 2 stents    previous percutaneous intervention on the  RCA and the diagonal branch  . ESOPHAGOGASTRODUODENOSCOPY  03/23/2012   Procedure: ESOPHAGOGASTRODUODENOSCOPY (EGD);  Surgeon: Winfield Cunas., MD;  Location: [facility];  Service: Endoscopy;  Laterality: N/A;  . ESOPHAGOGASTRODUODENOSCOPY N/A 03/04/2014   Procedure: ESOPHAGOGASTRODUODENOSCOPY (EGD);  Surgeon: Arta Silence, MD;  Location: [facility] ENDOSCOPY;  Service: Endoscopy;  Laterality: N/A;  . LAMINECTOMY    . LEFT HEART CATHETERIZATION WITH CORONARY ANGIOGRAM N/A 01/13/2014   Procedure: LEFT HEART CATHETERIZATION WITH CORONARY ANGIOGRAM;  Surgeon: Blane Ohara, MD;  Location: [facility] CATH LAB;  Service: Cardiovascular;  Laterality: N/A;  . MELANOMA EXCISION  late 1990's   "lower back"  . POSTERIOR LAMINECTOMY / DECOMPRESSION CERVICAL SPINE    . SAVORY DILATION  03/23/2012   Procedure: SAVORY DILATION;  Surgeon: Winfield Cunas., MD;  Location: [facility]OSCOPY;  Service: Endoscopy;  Laterality: N/A;  . TONSILLECTOMY  1930's       Home Medications    Prior to Admission medications   Medication Sig Start Date End Date Taking? Authorizing Provider  acetaminophen (TYLENOL) 325 MG tablet Take 2 tablets (650 mg total) by mouth every 4 (four) hours as needed for headache or mild pain. 12/16/13  Yes Erlene Quan, PA-C  aspirin EC 81 MG tablet Take 1 tablet (81 mg total) by mouth daily. 03/08/14  Yes Mikhail, Velta Addison, DO  atorvastatin (LIPITOR) 10 MG tablet Take 10 mg by mouth daily.   Yes [provider]  clopidogrel (PLAVIX) 75 MG tablet TAKE 1 TABLET BY MOUTH ONCE DAILY. Patient taking differently: TAKE 75MG  BY MOUTH ONCE DAILY. 06/01/17  Yes Fay Records, MD  doxazosin (CARDURA) 1 MG tablet TAKE 1 TABLET BY MOUTH ONCE  DAILY. Patient taking differently: TAKE 1MG  BY MOUTH ONCE DAILY. 06/01/17  Yes Fay Records, MD  furosemide (LASIX) 40 MG tablet TAKE 1 TABLET BY MOUTH ONCE DAILY AS NEEDED FOR EDEMA 02/16/16  Yes Richardson Dopp T, PA-C  HYDROcodone-acetaminophen (NORCO) 7.5-325 MG per tablet Take 1 tablet by mouth every 4 (four) hours as needed for moderate pain. FOR PAIN 06/05/14  Yes [provider]  isosorbide mononitrate (IMDUR) 60 MG 24 hr tablet TAKE 1 & 1/2 TABLET BY MOUTH DAILY Patient taking differently: TAKE 90MG  BY MOUTH DAILY 06/01/17  Yes Fay Records, MD  metoprolol tartrate (LOPRESSOR) 25 MG tablet TAKE 1 TABLET BY MOUTH TWICE DAILY Patient taking differently: TAKE 25MG  BY MOUTH TWICE DAILY 06/01/17  Yes Fay Records, MD  nitroGLYCERIN (NITROSTAT) 0.4 MG SL tablet Place 1 tablet (0.4 mg total) under the tongue every 5 (five) minutes as needed for chest pain. 09/15/16  Yes Fay Records, MD  pantoprazole (PROTONIX) 40 MG tablet TAKE 1 TABLET BY MOUTH TWICE DAILY Patient taking differently: TAKE 40MG  BY MOUTH TWICE DAILY 06/01/17  Yes Fay Records, MD  tamsulosin (FLOMAX) 0.4 MG CAPS capsule Take 0.4 mg by mouth daily.  01/13/15  Yes [provider]  amLODipine (NORVASC) 10 MG tablet Take 0.5 tablets (5 mg total) by mouth daily. Patient not taking: Reported on 06/18/2017 12/24/15   Fay Records, MD  atorvastatin (LIPITOR) 40 MG tablet TAKE 1 TABLET BY MOUTH AT BEDTIME FOR CHOLESTEROL Patient not taking: Reported on 06/18/2017 06/01/17   Fay Records, MD  ZETIA 10 MG tablet TAKE (1/2) TABLET BY MOUTH ONCE DAILY. Patient not taking: Reported on 01/03/2017 11/06/15   Fay Records, MD    Family History Family History  Problem Relation Age of Onset  . Diabetes Father   . Heart disease Father   . Asthma Father   . Heart disease Mother        CABG hx age 84  . Colon cancer Son        hx   . Colitis Son        hx  . Crohn's disease Son   . Prostate cancer Paternal Grandfather      Social History Social History  Substance Use Topics  . Smoking status: Never Smoker  . Smokeless tobacco: Never Used  . Alcohol use No     Allergies   Budesonide-formoterol fumarate; Iohexol; Simvastatin; Demerol [meperidine]; Ivp dye [iodinated diagnostic agents]; Meperidine hcl; Tape; Naproxen; Pregabalin; and Sulfonamide derivatives   Review of Systems Review of Systems  Constitutional: Negative for fever.  Respiratory: Negative for cough and shortness of breath.   Cardiovascular: Negative for chest pain.  Gastrointestinal: Negative for diarrhea and vomiting.  Genitourinary: Negative for dysuria.  Neurological: Negative for weakness, numbness and headaches.  Psychiatric/Behavioral: Positive for confusion.  All other systems reviewed and are negative.    Physical Exam Updated Vital Signs BP 128/83   Pulse 75   Temp 98.7 F (37.1 C) (Rectal)   Resp 17   Ht 5\' 9"  (1.753 m)   Wt 78 kg (172 lb)   SpO2 100%   BMI 25.40 kg/m   Physical Exam  Constitutional: He appears well-developed and well-nourished.  HENT:  Head: Normocephalic and atraumatic.  Right Ear: External ear normal.  Left Ear: External ear normal.  Nose: Nose normal.  Eyes: Right eye exhibits no discharge. Left eye exhibits no discharge.  Neck: Neck supple.  Cardiovascular: Normal rate, regular rhythm and normal heart sounds.   Pulmonary/Chest: Effort normal and breath sounds normal.  Abdominal: Soft. There is no tenderness.  Musculoskeletal: He exhibits no edema.  Neurological: He is alert.  Awake, alert, oriented to place and self. Knows its early September, can't remember year. Can describe president but doesn't know name. CN 3-12 grossly intact. 5/5 strength in all 4 extremities. Grossly normal sensation. Normal finger to nose.   Skin: Skin is warm and dry.  Nursing note and vitals reviewed.    ED Treatments / Results  Labs (all labs ordered are listed, but only abnormal results are  displayed) Labs Reviewed  BASIC METABOLIC PANEL - Abnormal; Notable for the following:       Result Value   Glucose, Bld 104 (*)  GFR calc non Af Amer 57 (*)    All other components within normal limits  CBC - Abnormal; Notable for the following:    RBC 4.21 (*)    Hemoglobin 12.5 (*)    HCT 37.8 (*)    All other components within normal limits  URINALYSIS, ROUTINE W REFLEX MICROSCOPIC - Abnormal; Notable for the following:    Hgb urine dipstick SMALL (*)    Squamous Epithelial / LPF 0-5 (*)    All other components within normal limits  RAPID URINE DRUG SCREEN, HOSP PERFORMED - Abnormal; Notable for the following:    Opiates POSITIVE (*)    All other components within normal limits  TROPONIN I - Abnormal; Notable for the following:    Troponin I 0.03 (*)    All other components within normal limits  ETHANOL  TSH  TROPONIN I  AMMONIA  TROPONIN I  TROPONIN I  VITAMIN B12  FOLATE  RPR  BASIC METABOLIC PANEL  CBC  HEPATIC FUNCTION PANEL    EKG  EKG Interpretation  Date/Time:  Sunday June 18 2017 17:16:57 EDT Ventricular Rate:  70 PR Interval:  160 QRS Duration: 78 QT Interval:  400 QTC Calculation: 432 R Axis:   -3 Text Interpretation:  Sinus rhythm with occasional Premature ventricular complexes and Premature atrial complexes Otherwise normal ECG PVCs, PACs new compared to Mar 2018 Confirmed by Sherwood Gambler [phone]) on 06/18/2017 6:36:39 PM       Radiology Dg Chest 2 View  Result Date: 06/18/2017 CLINICAL DATA:  Altered mental status. Patient difficult to wake up. EXAM: CHEST  2 VIEW COMPARISON:  01/03/2017 FINDINGS: Stable cardiomegaly with mild uncoiling of the thoracic aorta. There is aortic atherosclerosis without aneurysm. The lungs are clear. The patient is status post ACDF of the included lower cervical spine. Osteoarthritis of the AC joints bilaterally with spurring. No acute osseous abnormality. IMPRESSION: No active cardiopulmonary disease.  Electronically Signed   By: Ashley Royalty M.D.   On: 06/18/2017 21:08   Ct Head Wo Contrast  Result Date: 06/18/2017 CLINICAL DATA:  83 year old male with altered mental status. EXAM: CT HEAD WITHOUT CONTRAST TECHNIQUE: Contiguous axial images were obtained from the base of the skull through the vertex without intravenous contrast. COMPARISON:  06/17/2015 and prior CTs FINDINGS: Brain: No evidence of acute infarction, hemorrhage, hydrocephalus, extra-axial collection or mass lesion/mass effect. Atrophy and chronic small-vessel white matter ischemic changes again noted. Vascular: Intracranial atherosclerotic calcifications again noted. Skull: Normal. Negative for fracture or focal lesion. Sinuses/Orbits: No acute finding. Other: None. IMPRESSION: 1. No evidence of acute intracranial abnormality 2. Atrophy and chronic small-vessel white matter ischemic changes. Electronically Signed   By: Margarette Canada M.D.   On: 06/18/2017 21:05    Procedures Procedures (including critical care time)  Medications Ordered in ED Medications  atorvastatin (LIPITOR) tablet 10 mg (not administered)  doxazosin (CARDURA) tablet 1 mg (not administered)  pantoprazole (PROTONIX) EC tablet 40 mg (not administered)  clopidogrel (PLAVIX) tablet 75 mg (not administered)  metoprolol tartrate (LOPRESSOR) tablet 25 mg (not administered)  nitroGLYCERIN (NITROSTAT) SL tablet 0.4 mg (not administered)  tamsulosin (FLOMAX) capsule 0.4 mg (not administered)  HYDROcodone-acetaminophen (NORCO) 7.5-325 MG per tablet 1 tablet (not administered)  aspirin EC tablet 81 mg (not administered)  acetaminophen (TYLENOL) tablet 650 mg (not administered)    Or  acetaminophen (TYLENOL) suppository 650 mg (not administered)  ondansetron (ZOFRAN) tablet 4 mg (not administered)    Or  ondansetron (ZOFRAN) injection 4 mg (not  administered)  0.9 %  sodium chloride infusion (not administered)     Initial Impression / Assessment and Plan / ED Course   I have reviewed the triage vital signs and the nursing notes.  Pertinent labs & imaging results that were available during my care of the patient were reviewed by me and considered in my medical decision making (see chart for details).     Unclear cause of disorientation/confusion. Seemed worse earlier in the day, now still confused per family. Given this, will admit for obs and AMS workup. No clear sign of infection. Doubt meningitis. Dr. Hal Hope asks for MRI brain and ammonia.   Final Clinical Impressions(s) / ED Diagnoses   Final diagnoses:  Disorientation    New Prescriptions New Prescriptions   No medications on file     Sherwood Gambler, MD 06/19/17 [phone]

## 2017-06-18 NOTE — ED Notes (Signed)
Pt transported to CT ?

## 2017-06-18 NOTE — ED Triage Notes (Addendum)
Son couldn't get patient to answer phone and went to his house.  Pt was sleeping and difficult to wake up.  Pt hasn't felt well for the past few days.  Son reports pt was very confused today and yesterday and has generalized weakness.  Reports confusion has been gradually getting worse over the past several weeks. Pt reports bilateral leg pain. Pt alert and oriented to person and place.  States he is 83 years old.

## 2017-06-18 NOTE — H&P (Signed)
History and Physical    [name] NTI:[id number] DOB: 03-06-1931 DOA: 06/18/2017  PCP: [name], MD  Patient coming from: Home.  History provided by patient's son and daughter.  Chief Complaint: Confusion.  HPI: [name] is a 83 y.o. male with history of CAD status post stenting, chronic anemia, hypertension, history of dysphagia status post dilatation, chronic chest pains was found to be confused at home when patient's son went to check on him this evening. At around 4 PM patient's son went to check on him and since patient was not opening the door patient's son uses on case open and found patient lying on the bed sleeping and found it difficult to arouse. On awaking patient was found to be confused and was brought to the ER.   ED Course: In the ER patient slowly regained his orientation and at the time of my exam patient is well-oriented. Patient is being admitted for further observation. Per the family there was no new medications recently started. Patient does not drink alcohol.  Review of Systems: As per HPI, rest all negative.   Past Medical History:  Diagnosis Date  . Arthritis    "hips, back" (06/17/2015)  . Asthma   . Chronic chest pain   . Chronic lower back pain   . Coronary artery disease    a. s/p BMS to RCA in 2002; b. s/p cutting balloon POBA ;   c. cath 6/12: oDx 80% (treated with repeat cutting balloon POBA), mLAD 50% with 30-40% at Dx, CFX 30%, pRCA 25% with patent stents;  d.  Lex MV 4/14:  Low Risk - EF 61%, inf scar with peri-infarct ischemia  . Dyspnea    chronic  . GERD (gastroesophageal reflux disease)    h/o esophageal spasm  . Headache   . History of blood transfusion    "related to OR"  . History of hiatal hernia   . Hyperlipidemia   . Hypertension   . Melanoma of lower back ([place]) late 1990's  . Memory loss   . Myocardial infarction ([place]) 2001   x 1, confirned 1 possible    Past Surgical History:  Procedure Laterality Date    . ANTERIOR CERVICAL DECOMP/DISCECTOMY FUSION    . BACK SURGERY  multiple  . CARDIAC CATHETERIZATION     "I've had 17 caths" (06/17/2015)  . CATARACT EXTRACTION W/ INTRAOCULAR LENS  IMPLANT, BILATERAL Bilateral   . CERVICAL DISC SURGERY  multiple  . COLONOSCOPY WITH PROPOFOL N/A 04/17/2014   Procedure: COLONOSCOPY WITH PROPOFOL;  Surgeon: Winfield Cunas., MD;  Location: [facility] ENDOSCOPY;  Service: Endoscopy;  Laterality: N/A;  . CORONARY ANGIOPLASTY WITH STENT PLACEMENT  x 2 stents    previous percutaneous intervention on the  RCA and the diagonal branch  . ESOPHAGOGASTRODUODENOSCOPY  03/23/2012   Procedure: ESOPHAGOGASTRODUODENOSCOPY (EGD);  Surgeon: Winfield Cunas., MD;  Location: [facility];  Service: Endoscopy;  Laterality: N/A;  . ESOPHAGOGASTRODUODENOSCOPY N/A 03/04/2014   Procedure: ESOPHAGOGASTRODUODENOSCOPY (EGD);  Surgeon: Arta Silence, MD;  Location: [facility] ENDOSCOPY;  Service: Endoscopy;  Laterality: N/A;  . LAMINECTOMY    . LEFT HEART CATHETERIZATION WITH CORONARY ANGIOGRAM N/A 01/13/2014   Procedure: LEFT HEART CATHETERIZATION WITH CORONARY ANGIOGRAM;  Surgeon: Blane Ohara, MD;  Location: [facility] CATH LAB;  Service: Cardiovascular;  Laterality: N/A;  . MELANOMA EXCISION  late 1990's   "lower back"  . POSTERIOR LAMINECTOMY / DECOMPRESSION CERVICAL SPINE    . SAVORY DILATION  03/23/2012  Procedure: SAVORY DILATION;  Surgeon: Winfield Cunas., MD;  Location: [facility] ENDOSCOPY;  Service: Endoscopy;  Laterality: N/A;  . TONSILLECTOMY  1930's     reports that he has never smoked. He has never used smokeless tobacco. He reports that he does not drink alcohol or use drugs.  Allergies  Allergen Reactions  . Budesonide-Formoterol Fumarate Swelling    Mouth and tongue swelling.  . Iohexol Shortness Of Breath and Itching    Pt was given 100cc of Omnipaque 300 followed by itching/ dyspnea.  . Simvastatin Other (See Comments)    REACTION: unknown  . Demerol [Meperidine] Other (See  Comments)    REACTION: Hallucinations  . Ivp Dye [Iodinated Diagnostic Agents] Other (See Comments)    Iodine pt had a reaction when he had a CT DONE  . Meperidine Hcl Other (See Comments)    REACTION: Hallucinations  . Tape Other (See Comments)    Tears skin off, Please use "paper" tape  . Naproxen Swelling  . Pregabalin Rash  . Sulfonamide Derivatives Rash    Family History  Problem Relation Age of Onset  . Diabetes Father   . Heart disease Father   . Asthma Father   . Heart disease Mother        CABG hx age 53  . Colon cancer Son        hx   . Colitis Son        hx  . Crohn's disease Son   . Prostate cancer Paternal Grandfather     Prior to Admission medications   Medication Sig Start Date End Date Taking? Authorizing Provider  acetaminophen (TYLENOL) 325 MG tablet Take 2 tablets (650 mg total) by mouth every 4 (four) hours as needed for headache or mild pain. 12/16/13  Yes Erlene Quan, PA-C  aspirin EC 81 MG tablet Take 1 tablet (81 mg total) by mouth daily. 03/08/14  Yes Mikhail, Velta Addison, DO  atorvastatin (LIPITOR) 10 MG tablet Take 10 mg by mouth daily.   Yes [provider]  clopidogrel (PLAVIX) 75 MG tablet TAKE 1 TABLET BY MOUTH ONCE DAILY. Patient taking differently: TAKE 75MG  BY MOUTH ONCE DAILY. 06/01/17  Yes Fay Records, MD  doxazosin (CARDURA) 1 MG tablet TAKE 1 TABLET BY MOUTH ONCE DAILY. Patient taking differently: TAKE 1MG  BY MOUTH ONCE DAILY. 06/01/17  Yes Fay Records, MD  furosemide (LASIX) 40 MG tablet TAKE 1 TABLET BY MOUTH ONCE DAILY AS NEEDED FOR EDEMA 02/16/16  Yes Richardson Dopp T, PA-C  HYDROcodone-acetaminophen (NORCO) 7.5-325 MG per tablet Take 1 tablet by mouth every 4 (four) hours as needed for moderate pain. FOR PAIN 06/05/14  Yes [provider]  isosorbide mononitrate (IMDUR) 60 MG 24 hr tablet TAKE 1 & 1/2 TABLET BY MOUTH DAILY Patient taking differently: TAKE 90MG  BY MOUTH DAILY 06/01/17  Yes Fay Records, MD  metoprolol  tartrate (LOPRESSOR) 25 MG tablet TAKE 1 TABLET BY MOUTH TWICE DAILY Patient taking differently: TAKE 25MG  BY MOUTH TWICE DAILY 06/01/17  Yes Fay Records, MD  nitroGLYCERIN (NITROSTAT) 0.4 MG SL tablet Place 1 tablet (0.4 mg total) under the tongue every 5 (five) minutes as needed for chest pain. 09/15/16  Yes Fay Records, MD  pantoprazole (PROTONIX) 40 MG tablet TAKE 1 TABLET BY MOUTH TWICE DAILY Patient taking differently: TAKE 40MG  BY MOUTH TWICE DAILY 06/01/17  Yes Fay Records, MD  tamsulosin (FLOMAX) 0.4 MG CAPS capsule Take 0.4 mg by mouth daily.  01/13/15  Yes [provider]  amLODipine (NORVASC) 10 MG tablet Take 0.5 tablets (5 mg total) by mouth daily. Patient not taking: Reported on 06/18/2017 12/24/15   Fay Records, MD  atorvastatin (LIPITOR) 40 MG tablet TAKE 1 TABLET BY MOUTH AT BEDTIME FOR CHOLESTEROL Patient not taking: Reported on 06/18/2017 06/01/17   Fay Records, MD  ZETIA 10 MG tablet TAKE (1/2) TABLET BY MOUTH ONCE DAILY. Patient not taking: Reported on 01/03/2017 11/06/15   Fay Records, MD    Physical Exam: Vitals:   06/18/17 2143 06/18/17 2230 06/18/17 2300 06/18/17 2315  BP:  (!) 178/90 (!) 160/95 127/81  Pulse: 70 68 (!) 58 91  Resp: 17 14 17 17   Temp:      TempSrc:      SpO2:  100% 99% 98%  Weight:      Height:          Constitutional: Moderately built and nourished. Vitals:   06/18/17 2143 06/18/17 2230 06/18/17 2300 06/18/17 2315  BP:  (!) 178/90 (!) 160/95 127/81  Pulse: 70 68 (!) 58 91  Resp: 17 14 17 17   Temp:      TempSrc:      SpO2:  100% 99% 98%  Weight:      Height:       Eyes: Anicteric no pallor. ENMT: No discharge from the ears eyes nose and mouth. Neck: No mass felt. No neck rigidity. Respiratory: No rhonchi or crepitations. Cardiovascular: S1-S2 heard no murmurs appreciated. Abdomen: Soft nontender bowel sounds present. Musculoskeletal: No edema. No joint effusion. Skin: No rash. Skin appears warm. Neurologic: Alert  awake oriented to time place and person. Moves all extremities. Psychiatric: Appears normal. Normal affect.   Labs on Admission: I have personally reviewed following labs and imaging studies  CBC:  Recent Labs Lab 06/18/17 1721  WBC 6.5  HGB 12.5*  HCT 37.8*  MCV 89.8  PLT 527   Basic Metabolic Panel:  Recent Labs Lab 06/18/17 1721  NA 137  K 3.8  CL 102  CO2 26  GLUCOSE 104*  BUN 19  CREATININE 1.14  CALCIUM 9.6   GFR: Estimated Creatinine Clearance: 48.2 mL/min (by C-G formula based on SCr of 1.14 mg/dL). Liver Function Tests: No results for input(s): AST, ALT, ALKPHOS, BILITOT, PROT, ALBUMIN in the last 168 hours. No results for input(s): LIPASE, AMYLASE in the last 168 hours. No results for input(s): AMMONIA in the last 168 hours. Coagulation Profile: No results for input(s): INR, PROTIME in the last 168 hours. Cardiac Enzymes:  Recent Labs Lab 06/18/17 2001  TROPONINI 0.03*   BNP (last 3 results) No results for input(s): PROBNP in the last 8760 hours. HbA1C: No results for input(s): HGBA1C in the last 72 hours. CBG: No results for input(s): GLUCAP in the last 168 hours. Lipid Profile: No results for input(s): CHOL, HDL, LDLCALC, TRIG, CHOLHDL, LDLDIRECT in the last 72 hours. Thyroid Function Tests:  Recent Labs  06/18/17 2001  TSH 0.974   Anemia Panel: No results for input(s): VITAMINB12, FOLATE, FERRITIN, TIBC, IRON, RETICCTPCT in the last 72 hours. Urine analysis:    Component Value Date/Time   COLORURINE YELLOW 06/18/2017 2001   APPEARANCEUR CLEAR 06/18/2017 2001   LABSPEC 1.024 06/18/2017 2001   PHURINE 5.0 06/18/2017 2001   GLUCOSEU NEGATIVE 06/18/2017 2001   GLUCOSEU NEGATIVE 08/30/2010 1329   HGBUR SMALL (A) 06/18/2017 2001   BILIRUBINUR NEGATIVE 06/18/2017 2001   KETONESUR NEGATIVE 06/18/2017 2001   PROTEINUR  NEGATIVE 06/18/2017 2001   UROBILINOGEN 1.0 06/17/2015 1200   NITRITE NEGATIVE 06/18/2017 2001   LEUKOCYTESUR NEGATIVE  06/18/2017 2001   Sepsis Labs: @LABRCNTIP (procalcitonin:4,lacticidven:4) )No results found for this or any previous visit (from the past 240 hour(s)).   Radiological Exams on Admission: Dg Chest 2 View  Result Date: 06/18/2017 CLINICAL DATA:  Altered mental status. Patient difficult to wake up. EXAM: CHEST  2 VIEW COMPARISON:  01/03/2017 FINDINGS: Stable cardiomegaly with mild uncoiling of the thoracic aorta. There is aortic atherosclerosis without aneurysm. The lungs are clear. The patient is status post ACDF of the included lower cervical spine. Osteoarthritis of the AC joints bilaterally with spurring. No acute osseous abnormality. IMPRESSION: No active cardiopulmonary disease. Electronically Signed   By: Ashley Royalty M.D.   On: 06/18/2017 21:08   Ct Head Wo Contrast  Result Date: 06/18/2017 CLINICAL DATA:  83 year old male with altered mental status. EXAM: CT HEAD WITHOUT CONTRAST TECHNIQUE: Contiguous axial images were obtained from the base of the skull through the vertex without intravenous contrast. COMPARISON:  06/17/2015 and prior CTs FINDINGS: Brain: No evidence of acute infarction, hemorrhage, hydrocephalus, extra-axial collection or mass lesion/mass effect. Atrophy and chronic small-vessel white matter ischemic changes again noted. Vascular: Intracranial atherosclerotic calcifications again noted. Skull: Normal. Negative for fracture or focal lesion. Sinuses/Orbits: No acute finding. Other: None. IMPRESSION: 1. No evidence of acute intracranial abnormality 2. Atrophy and chronic small-vessel white matter ischemic changes. Electronically Signed   By: Margarette Canada M.D.   On: 06/18/2017 21:05    EKG: Independently reviewed. Normal sinus rhythm. PVCs.  Assessment/Plan Principal Problem:   Acute encephalopathy Active Problems:   Essential hypertension   Coronary artery disease involving native coronary artery of native heart with angina pectoris ([place])    1. Acute encephalopathy -  patient is essentially back to his baseline by the time I examined. Will check MRI brain ammonia levels and TSH and B-12 and folate levels. As per the family patient at times get delusional. May need psychiatric referral if MRI and other workup is negative. 2. Essential hypertension on amlodipine and beta blocker. 3. CAD status post stenting with mildly elevated troponin - patient denies any chest pain. We will cycle cardiac markers continue patient's home dose of statins Zetia beta blocker antiplatelet agents. 4. Chronic anemia - follow CBC and further workup as outpatient.  I have reviewed patient's old charts labs.   DVT prophylaxis: Lovenox. Code Status: DO NOT RESUSCITATE.  Family Communication: Discussed with patient's son and daughter.  Disposition Plan: Home.  Consults called: None.  Admission status: Observation.    Rise Patience MD [facility] Pager [phone].  If 7PM-7AM, please contact night-coverage [email or web address] Password [email or web address]  06/18/2017, 11:58 PM

## 2017-06-18 NOTE — ED Notes (Signed)
Called CT and asked for them to transport him to XRAY after they finished his scan

## 2017-06-19 ENCOUNTER — Observation Stay (HOSPITAL_COMMUNITY): Payer: Medicare Other

## 2017-06-19 DIAGNOSIS — G9340 Encephalopathy, unspecified: Secondary | ICD-10-CM | POA: Diagnosis not present

## 2017-06-19 DIAGNOSIS — I10 Essential (primary) hypertension: Secondary | ICD-10-CM | POA: Diagnosis not present

## 2017-06-19 DIAGNOSIS — F0391 Unspecified dementia with behavioral disturbance: Secondary | ICD-10-CM | POA: Diagnosis not present

## 2017-06-19 DIAGNOSIS — E538 Deficiency of other specified B group vitamins: Secondary | ICD-10-CM

## 2017-06-19 LAB — CBC
HCT: 34 % — ABNORMAL LOW (ref 39.0–52.0)
Hemoglobin: 11.1 g/dL — ABNORMAL LOW (ref 13.0–17.0)
MCH: 29.3 pg (ref 26.0–34.0)
MCHC: 32.6 g/dL (ref 30.0–36.0)
MCV: 89.7 fL (ref 78.0–100.0)
PLATELETS: 221 10*3/uL (ref 150–400)
RBC: 3.79 MIL/uL — ABNORMAL LOW (ref 4.22–5.81)
RDW: 14 % (ref 11.5–15.5)
WBC: 6.3 10*3/uL (ref 4.0–10.5)

## 2017-06-19 LAB — VITAMIN B12: VITAMIN B 12: 254 pg/mL (ref 180–914)

## 2017-06-19 LAB — TROPONIN I
TROPONIN I: 0.03 ng/mL — AB (ref <0.03)
Troponin I: <0.03 ng/mL (ref <0.03)
Troponin I: <0.03 ng/mL (ref <0.03)

## 2017-06-19 LAB — BASIC METABOLIC PANEL
Anion gap: 6 (ref 5–15)
BUN: 18 mg/dL (ref 6–20)
CO2: 27 mmol/L (ref 22–32)
Calcium: 9.1 mg/dL (ref 8.9–10.3)
Chloride: 104 mmol/L (ref 101–111)
Creatinine, Ser: 1.02 mg/dL (ref 0.61–1.24)
GFR calc Af Amer: >60 mL/min (ref >60)
Glucose, Bld: 109 mg/dL — ABNORMAL HIGH (ref 65–99)
Potassium: 3.9 mmol/L (ref 3.5–5.1)
SODIUM: 137 mmol/L (ref 135–145)

## 2017-06-19 LAB — HEPATIC FUNCTION PANEL
ALT: 17 U/L (ref 17–63)
AST: 22 U/L (ref 15–41)
Albumin: 2.9 g/dL — ABNORMAL LOW (ref 3.5–5.0)
Alkaline Phosphatase: 84 U/L (ref 38–126)
BILIRUBIN DIRECT: 0.2 mg/dL (ref 0.1–0.5)
Indirect Bilirubin: 0.3 mg/dL (ref 0.3–0.9)
Total Bilirubin: 0.5 mg/dL (ref 0.3–1.2)
Total Protein: 5.6 g/dL — ABNORMAL LOW (ref 6.5–8.1)

## 2017-06-19 LAB — FOLATE: FOLATE: 24 ng/mL (ref >5.9)

## 2017-06-19 LAB — AMMONIA: AMMONIA: 26 umol/L (ref 9–35)

## 2017-06-19 MED ORDER — ISOSORBIDE MONONITRATE ER 60 MG PO TB24
90.0000 mg | ORAL_TABLET | Freq: Every day | ORAL | Status: DC
  Administered 2017-06-19 – 2017-06-20 (×2): 90 mg via ORAL
  Filled 2017-06-19 (×2): qty 1

## 2017-06-19 MED ORDER — SODIUM CHLORIDE 0.9 % IV SOLN
INTRAVENOUS | Status: DC
  Administered 2017-06-19 (×2): via INTRAVENOUS

## 2017-06-19 MED ORDER — QUETIAPINE FUMARATE 25 MG PO TABS: 25.0000 mg | ORAL_TABLET | Freq: Every day | ORAL | Status: DC

## 2017-06-19 MED ORDER — CYANOCOBALAMIN 1000 MCG/ML IJ SOLN
1000.0000 ug | Freq: Once | INTRAMUSCULAR | Status: AC
  Administered 2017-06-19: 1000 ug via INTRAMUSCULAR
  Filled 2017-06-19: qty 1

## 2017-06-19 MED ORDER — HALOPERIDOL LACTATE 5 MG/ML IJ SOLN
2.0000 mg | Freq: Four times a day (QID) | INTRAMUSCULAR | Status: DC | PRN
  Administered 2017-06-19: 2 mg via INTRAVENOUS
  Filled 2017-06-19: qty 1

## 2017-06-19 MED ORDER — ENSURE ENLIVE PO LIQD
237.0000 mL | Freq: Two times a day (BID) | ORAL | Status: DC
  Administered 2017-06-19 – 2017-06-20 (×3): 237 mL via ORAL

## 2017-06-19 MED ORDER — VITAMIN B-12 1000 MCG PO TABS
1000.0000 ug | ORAL_TABLET | Freq: Every day | ORAL | Status: DC
  Administered 2017-06-20: 1000 ug via ORAL
  Filled 2017-06-19: qty 1

## 2017-06-19 MED ORDER — AMLODIPINE BESYLATE 5 MG PO TABS
5.0000 mg | ORAL_TABLET | Freq: Every day | ORAL | Status: DC
  Administered 2017-06-19 – 2017-06-20 (×2): 5 mg via ORAL
  Filled 2017-06-19 (×2): qty 1
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
## 2017-06-19 NOTE — Progress Notes (Signed)
PT Cancellation Note  Patient Details Name: [name] MRN: [id number] DOB: May 27, 1931   Cancelled Treatment:    Reason Eval/Treat Not Completed: Patient declined, no reason specified Pt declined working with PT- "just don't feel like it." Pt with tangential stories about xmas and his back pain. Will follow up.   [place] 06/19/2017, 4:00 PM  Wray Kearns, [place], DPT [phone]

## 2017-06-20 ENCOUNTER — Observation Stay (HOSPITAL_BASED_OUTPATIENT_CLINIC_OR_DEPARTMENT_OTHER): Payer: Medicare Other

## 2017-06-20 DIAGNOSIS — I5030 Unspecified diastolic (congestive) heart failure: Secondary | ICD-10-CM | POA: Diagnosis not present

## 2017-06-20 DIAGNOSIS — I2510 Atherosclerotic heart disease of native coronary artery without angina pectoris: Secondary | ICD-10-CM | POA: Diagnosis not present

## 2017-06-20 DIAGNOSIS — I2583 Coronary atherosclerosis due to lipid rich plaque: Secondary | ICD-10-CM | POA: Diagnosis not present

## 2017-06-20 DIAGNOSIS — F4320 Adjustment disorder, unspecified: Secondary | ICD-10-CM

## 2017-06-20 DIAGNOSIS — E538 Deficiency of other specified B group vitamins: Secondary | ICD-10-CM | POA: Diagnosis not present

## 2017-06-20 DIAGNOSIS — G9340 Encephalopathy, unspecified: Secondary | ICD-10-CM | POA: Diagnosis not present

## 2017-06-20 DIAGNOSIS — I10 Essential (primary) hypertension: Secondary | ICD-10-CM | POA: Diagnosis not present

## 2017-06-20 LAB — ECHOCARDIOGRAM COMPLETE
HEIGHTINCHES: 69 in
WEIGHTICAEL: 2425.6 [oz_av]

## 2017-06-20 LAB — RPR: RPR Ser Ql: NONREACTIVE

## 2017-06-20 MED ORDER — CYANOCOBALAMIN 1000 MCG PO TABS: 1000.0000 ug | ORAL_TABLET | Freq: Every day | ORAL | 0 refills | Status: DC
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
## 2017-06-20 NOTE — Evaluation (Addendum)
Physical Therapy Evaluation Patient Details Name: [name] MRN: [id number] DOB: 01-Mar-1931 Today's Date: 06/20/2017   History of Present Illness  Pt is a 83 yo male brought to ER with altered mental status when son checked on him, found to have acute encephalopathy and essential hypertension. Prior medical history of CAD status post stenting, chronic anemia, hypertension, history of dysphagia status post dilatation, chronic chest pains.   Clinical Impression  Pt admitted with above diagnosis. Pt currently with functional limitations due to the deficits listed below (see PT Problem List). Pt is currently mod I for bed mobility, min guard for transfers and ambulation of 150 feet and minA for ascent/descent of 2 steps. Pt will benefit from skilled PT to increase their independence and safety with mobility to allow discharge to the venue listed below.       Follow Up Recommendations Home health PT;Supervision for mobility/OOB    Equipment Recommendations  None recommended by PT    Recommendations for Other Services       Precautions / Restrictions Precautions Precautions: Fall Restrictions Weight Bearing Restrictions: No      Mobility  Bed Mobility Overal bed mobility: Modified Independent             General bed mobility comments: HoB elevated   Transfers Overall transfer level: Needs assistance Equipment used: Rolling walker (2 wheeled) Transfers: Sit to/from Stand Sit to Stand: Min guard         General transfer comment: min guard for safety, pt usually uses cane so required vc for hand placement for power up to RW  Ambulation/Gait Ambulation/Gait assistance: Min guard Ambulation Distance (Feet): 150 Feet Assistive device: Rolling walker (2 wheeled) Gait Pattern/deviations: Step-through pattern;Decreased step length - right;Decreased step length - left;Trunk flexed;Drifts right/left Gait velocity: slowed Gait velocity interpretation: Below normal speed for  age/gender General Gait Details: min guard for safety, pt repeatedly stated that he uses a cane and does not feel comfortable with RW, vc for sequencing, RW management, upright posture and looking up and out  Stairs Stairs: Yes Stairs assistance: Min assist Stair Management: One rail Right;Step to pattern;Forwards Number of Stairs: 2 General stair comments: min assist for IV managment and steadying with ascent/descent, pt has no rails at home and uses his cane.  Wheelchair Mobility    Modified Rankin (Stroke Patients Only)       Balance Overall balance assessment: Needs assistance Sitting-balance support: No upper extremity supported;Feet supported Sitting balance-Leahy Scale: Good Sitting balance - Comments: sits EoB with no UE assist   Standing balance support: Single extremity supported Standing balance-Leahy Scale: Fair Standing balance comment: able to balance with single UE support on RW                             Pertinent Vitals/Pain Pain Assessment: No/denies pain    Home Living Family/patient expects to be discharged to:: Private residence Living Arrangements: Alone Available Help at Discharge: Family;Available PRN/intermittently Type of Home: House Home Access: Stairs to enter Entrance Stairs-Rails: None Entrance Stairs-Number of Steps: 2 Home Layout: One level Home Equipment: Cane - single point;Shower seat;Grab bars - tub/shower;Hand held Tourist information centre manager - 2 wheels      Prior Function Level of Independence: Independent with assistive device(s)         Comments: community ambulator with cane and drives     Hand Dominance   Dominant Hand: Right    Extremity/Trunk Assessment   Upper  Extremity Assessment Upper Extremity Assessment: Generalized weakness    Lower Extremity Assessment Lower Extremity Assessment: Generalized weakness    Cervical / Trunk Assessment Cervical / Trunk Assessment: Kyphotic  Communication    Communication: No difficulties  Cognition Arousal/Alertness: Awake/alert Behavior During Therapy: WFL for tasks assessed/performed Overall Cognitive Status: No family/caregiver present to determine baseline cognitive functioning                                 General Comments: oriented to person, place and time not sure why he's in hospital,       General Comments General comments (skin integrity, edema, etc.): VSS        Assessment/Plan    PT Assessment Patient needs continued PT services  PT Problem List Decreased knowledge of use of DME;Decreased safety awareness;Decreased strength;Decreased cognition       PT Treatment Interventions Gait training;DME instruction;Stair training;Functional mobility training;Therapeutic activities;Therapeutic exercise;Balance training;Patient/family education    PT Goals (Current goals can be found in the Care Plan section)  Acute Rehab PT Goals Patient Stated Goal: go home PT Goal Formulation: With patient Time For Goal Achievement: 06/27/17 Potential to Achieve Goals: Good    Frequency Min 3X/week   Barriers to discharge Decreased caregiver support         AM-PAC PT "6 Clicks" Daily Activity  Outcome Measure Difficulty turning over in bed (including adjusting bedclothes, sheets and blankets)?: A Little Difficulty moving from lying on back to sitting on the side of the bed? : A Little Difficulty sitting down on and standing up from a chair with arms (e.g., wheelchair, bedside commode, etc,.)?: Unable Help needed moving to and from a bed to chair (including a wheelchair)?: None Help needed walking in hospital room?: None Help needed climbing 3-5 steps with a railing? : A Little 6 Click Score: 18    End of Session Equipment Utilized During Treatment: Gait belt Activity Tolerance: Patient tolerated treatment well Patient left: in chair;with call bell/phone within reach;with chair alarm set Nurse Communication:  Mobility status PT Visit Diagnosis: Other abnormalities of gait and mobility (R26.89);Muscle weakness (generalized) (M62.81);Difficulty in walking, not elsewhere classified (R26.2)    Time: 0930-1002 PT Time Calculation (min) (ACUTE ONLY): 32 min   Charges:   PT Evaluation $PT Eval Moderate Complexity: 1 Mod PT Treatments $Gait Training: 8-22 mins   PT G Codes:   PT G-Codes **NOT FOR INPATIENT CLASS** Functional Assessment Tool Used: AM-PAC 6 Clicks Basic Mobility Functional Limitation: Mobility: Walking and moving around Mobility: Walking and Moving Around Current Status (R9163): At least 40 percent but less than 60 percent impaired, limited or restricted Mobility: Walking and Moving Around Goal Status [phone]): At least 1 percent but less than 20 percent impaired, limited or restricted    Benjamine Mola B. Migdalia Dk PT, DPT Acute Rehabilitation  [phone] Pager [phone]    [place] 06/20/2017, 11:11 AM

## 2017-06-20 NOTE — Progress Notes (Signed)
Patient with no complaints or concerns during 7pm - 7am shift. Slept mostly during th night.   Curtistine Pettitt, RN

## 2017-06-20 NOTE — Progress Notes (Signed)
Initial Nutrition Assessment  DOCUMENTATION CODES:   Non-severe (moderate) malnutrition in context of chronic illness  INTERVENTION:    Continue Ensure Enlive po BID (vanilla flavor please), each supplement provides 350 kcal and 20 grams of protein  NUTRITION DIAGNOSIS:   Malnutrition (moderate) related to chronic illness (CAD, anemia) as evidenced by moderate depletion of body fat, moderate depletions of muscle mass  GOAL:   Patient will meet greater than or equal to 90% of their needs  MONITOR:   PO intake, Supplement acceptance, Labs, Weight trends, Skin, I & O's  REASON FOR ASSESSMENT:   Malnutrition Screening Tool  ASSESSMENT:   83 y.o. Male with history of CAD status post stenting, chronic anemia, hypertension, history of dysphagia status post dilatation, chronic chest pains was found to be confused at home when patient's son went to check on him this evening.  Pt slightly confused with RD interview questions. He reports his appetite isn't very good.  When asked if he didn't like the hospital food he said "I relish it". RD unsure of pt's quality of diet at home. Chocolate Ensure Enlive on tray table; likes vanilla. He believes he's lost weight, however, unable to identify quantity/time frame. Labs and medications reviewed.  Nutrition-Focused physical exam completed. Findings are moderate fat depletion, moderate muscle depletion, and no edema.   Diet Order:  Diet Heart Room service appropriate? Yes; Fluid consistency: Thin  Skin:  Reviewed, no issues  Last BM:  9/4  Height:   Ht Readings from Last 1 Encounters:  06/18/17 5\' 9"  (1.753 m)   Weight:   Wt Readings from Last 1 Encounters:  06/20/17 151 lb 9.6 oz (68.8 kg)   Ideal Body Weight:  73 kg  BMI:  Body mass index is 22.39 kg/m.  Estimated Nutritional Needs:   Kcal:  1700-1900  Protein:  80-95 gm  Fluid:  1.7-1.9 L  EDUCATION NEEDS:   No education needs identified at this time  Arthur Holms, RD, LDN Pager #: [phone] After-Hours Pager #: [phone]

## 2017-06-20 NOTE — Progress Notes (Signed)
Pts right arm swollen from infiltrated IV.  Pt not c/o pain at this time.  Pt is DC, instructed to elevate arm on pillows tonight.  Pt given discharge instructions and verbalized understanding.  Pt will discharge home with family.

## 2017-06-20 NOTE — Progress Notes (Signed)
Attempt x 2 to reach contacts to review [name] letter, no answer. Will retry later. [place], Dani Son   [phone]  Maryan Rued Daughter [phone]

## 2017-06-20 NOTE — Progress Notes (Signed)
  Echocardiogram 2D Echocardiogram has been performed.  [name] [name] 06/20/2017, 1:08 PM

## 2017-06-20 NOTE — Discharge Summary (Signed)
Physician Discharge Summary  DANIELL MANCINAS DUK:[id number] DOB: 1931/01/21 DOA: 06/18/2017  PCP: [name], MD  Admit date: 06/18/2017 Discharge date: 06/20/2017   Recommendations for Outpatient Follow-Up:   1. May need referral to geripsych 2. Monitor B12 3. Home health 4. Titrate BP medications as tolerated 5. Echo read pending   Discharge Diagnosis:   Principal Problem:   Acute encephalopathy Active Problems:   Essential hypertension   Coronary artery disease involving native coronary artery of native heart with angina pectoris ([place])   B12 deficiency   Discharge disposition:  Home.:  Discharge Condition: Improved.  Diet recommendation: Low sodium, heart healthy  Wound care: None.   History of Present Illness:   [name] is a 83 y.o. male with history of CAD status post stenting, chronic anemia, hypertension, history of dysphagia status post dilatation, chronic chest pains was found to be confused at home when patient's son went to check on him this evening. At around 4 PM patient's son went to check on him and since patient was not opening the door patient's son uses on case open and found patient lying on the bed sleeping and found it difficult to arouse. On awaking patient was found to be confused and was brought to the [facility] by Problem:   Acute encephalopathy- ? Worsening of dementia vs hypertensive encephaolopathy -B12 lower end of normal--- will replace to at least 500 -TSH normal -RPR negative -will need to follow up with Dr. Krista Blue (neuro) upon d/c for mental status eval MRI does not show CVA-- motion degraded -echo: pending read  HTN -BP high when patient came in, adjust medications as tolerated  Paranoia --psych consult- no new meds, psych signed off    Medical Consultants:    psych   Discharge Exam:   Vitals:   06/19/17 2241 06/20/17 0626  BP: (!) 153/84 (!) 154/77  Pulse: 85 86  Resp:  18  Temp: 98 F  (36.7 C) 98.4 F (36.9 C)  SpO2: 99% 98%   Vitals:   06/19/17 1655 06/19/17 1923 06/19/17 2241 06/20/17 0626  BP: (!) 154/72 (!) 144/70 (!) 153/84 (!) 154/77  Pulse: (!) 58 75 85 86  Resp: 18 18  18   Temp: 98 F (36.7 C) 97.8 F (36.6 C) 98 F (36.7 C) 98.4 F (36.9 C)  TempSrc: Oral Oral  Oral  SpO2: 100% 100% 99% 98%  Weight:    68.8 kg (151 lb 9.6 oz)  Height:            The results of significant diagnostics from this hospitalization (including imaging, microbiology, ancillary and laboratory) are listed below for reference.     Procedures and Diagnostic Studies:   Dg Chest 2 View  Result Date: 06/18/2017 CLINICAL DATA:  Altered mental status. Patient difficult to wake up. EXAM: CHEST  2 VIEW COMPARISON:  01/03/2017 FINDINGS: Stable cardiomegaly with mild uncoiling of the thoracic aorta. There is aortic atherosclerosis without aneurysm. The lungs are clear. The patient is status post ACDF of the included lower cervical spine. Osteoarthritis of the AC joints bilaterally with spurring. No acute osseous abnormality. IMPRESSION: No active cardiopulmonary disease. Electronically Signed   By: Ashley Royalty M.D.   On: 06/18/2017 21:08   Ct Head Wo Contrast  Result Date: 06/18/2017 CLINICAL DATA:  83 year old male with altered mental status. EXAM: CT HEAD WITHOUT CONTRAST TECHNIQUE: Contiguous axial images were obtained from the base of the skull through the vertex without intravenous  contrast. COMPARISON:  06/17/2015 and prior CTs FINDINGS: Brain: No evidence of acute infarction, hemorrhage, hydrocephalus, extra-axial collection or mass lesion/mass effect. Atrophy and chronic small-vessel white matter ischemic changes again noted. Vascular: Intracranial atherosclerotic calcifications again noted. Skull: Normal. Negative for fracture or focal lesion. Sinuses/Orbits: No acute finding. Other: None. IMPRESSION: 1. No evidence of acute intracranial abnormality 2. Atrophy and chronic  small-vessel white matter ischemic changes. Electronically Signed   By: Margarette Canada M.D.   On: 06/18/2017 21:05   Mr Brain Wo Contrast  Result Date: 06/19/2017 CLINICAL DATA:  83 y/o M; unexplained altered level of consciousness. EXAM: MRI HEAD WITHOUT CONTRAST TECHNIQUE: Multiplanar, multiecho pulse sequences of the brain and surrounding structures were obtained without intravenous contrast. COMPARISON:  06/18/2017 CT of the head. 08/21/2015 MRI of the head. 12/31/2012 cervical MRI. FINDINGS: Brain: Motion degradation on multiple sequences. No reduced diffusion to suggest acute or early subacute infarction. Nonspecific T2 FLAIR hyperintense signal of white matter is predominantly periventricular in the periatrial region, likely chronic microvascular ischemic changes and stable in comparison with prior MRI of the brain. Stable brain parenchymal volume loss. Motion degraded susceptibility weighted sequence, no gross evidence for intracranial hemorrhage. No focal mass effect, hydrocephalus, or herniation. Vascular: Normal flow voids. Skull and upper cervical spine: Soft tissue thickening posterior to the dens with impingement of the cervical spinal cord at the C1 level (series 5, image 13). Sinuses/Orbits: Mild diffuse paranasal sinus mucosal thickening. Bilateral intra-ocular lens replacement. No abnormal signal of the mastoid air cells. Other: None. IMPRESSION: 1. Motion degradation on multiple sequences. 2. No acute intracranial abnormality identified. 3. Stable chronic microvascular ischemic changes and parenchymal volume loss of the brain. 4. Soft tissue thickening posterior to the dens with impingement of the cervical spinal cord at C1, progression from 2014. Some differential consideration includes rheumatoid pannus, amyloidosis, gout, or the calcium pyrophosphate dihydrate deposition (CPPD). 5. Mild paranasal sinus disease. Electronically Signed   By: Kristine Garbe M.D.   On: 06/19/2017 22:56      Labs:   Basic Metabolic Panel:  Recent Labs Lab 06/18/17 1721 06/19/17 0539  NA 137 137  K 3.8 3.9  CL 102 104  CO2 26 27  GLUCOSE 104* 109*  BUN 19 18  CREATININE 1.14 1.02  CALCIUM 9.6 9.1   GFR Estimated Creatinine Clearance: 52.5 mL/min (by C-G formula based on SCr of 1.02 mg/dL). Liver Function Tests:  Recent Labs Lab 06/19/17 0539  AST 22  ALT 17  ALKPHOS 84  BILITOT 0.5  PROT 5.6*  ALBUMIN 2.9*   No results for input(s): LIPASE, AMYLASE in the last 168 hours.  Recent Labs Lab 06/19/17 0001  AMMONIA 26   Coagulation profile No results for input(s): INR, PROTIME in the last 168 hours.  CBC:  Recent Labs Lab 06/18/17 1721 06/19/17 0539  WBC 6.5 6.3  HGB 12.5* 11.1*  HCT 37.8* 34.0*  MCV 89.8 89.7  PLT 260 221   Cardiac Enzymes:  Recent Labs Lab 06/18/17 2001 06/19/17 0001 06/19/17 0539 06/19/17 1139  TROPONINI 0.03* 0.03* <0.03 <0.03   BNP: Invalid input(s): POCBNP CBG: No results for input(s): GLUCAP in the last 168 hours. D-Dimer No results for input(s): DDIMER in the last 72 hours. Hgb A1c No results for input(s): HGBA1C in the last 72 hours. Lipid Profile No results for input(s): CHOL, HDL, LDLCALC, TRIG, CHOLHDL, LDLDIRECT in the last 72 hours. Thyroid function studies  Recent Labs  06/18/17 2001  TSH 0.974   Anemia work  up  Recent Labs  06/19/17 0001  VITAMINB12 254  FOLATE 24.0   Microbiology No results found for this or any previous visit (from the past 240 hour(s)).   Discharge Instructions:   Discharge Instructions    Diet - low sodium heart healthy    Complete by:  As directed    Discharge instructions    Complete by:  As directed    Home health   Increase activity slowly    Complete by:  As directed      Allergies as of 06/20/2017      Reactions   Budesonide-formoterol Fumarate Swelling   Mouth and tongue swelling.   Iohexol Shortness Of Breath, Itching   Pt was given 100cc of Omnipaque  300 followed by itching/ dyspnea.   Simvastatin Other (See Comments)   REACTION: unknown   Demerol [meperidine] Other (See Comments)   REACTION: Hallucinations   Ivp Dye [iodinated Diagnostic Agents] Other (See Comments)   Iodine pt had a reaction when he had a CT DONE   Meperidine Hcl Other (See Comments)   REACTION: Hallucinations   Tape Other (See Comments)   Tears skin off, Please use "paper" tape   Naproxen Swelling   Pregabalin Rash   Sulfonamide Derivatives Rash      Medication List    STOP taking these medications   amLODipine 10 MG tablet Commonly known as:  NORVASC   furosemide 40 MG tablet Commonly known as:  LASIX   ZETIA 10 MG tablet Generic drug:  ezetimibe     TAKE these medications   acetaminophen 325 MG tablet Commonly known as:  TYLENOL Take 2 tablets (650 mg total) by mouth every 4 (four) hours as needed for headache or mild pain.   aspirin EC 81 MG tablet Take 1 tablet (81 mg total) by mouth daily.   atorvastatin 10 MG tablet Commonly known as:  LIPITOR Take 10 mg by mouth daily. What changed:  Another medication with the same name was removed. Continue taking this medication, and follow the directions you see here.   clopidogrel 75 MG tablet Commonly known as:  PLAVIX TAKE 1 TABLET BY MOUTH ONCE DAILY. What changed:  See the new instructions.   cyanocobalamin 1000 MCG tablet Take 1 tablet (1,000 mcg total) by mouth daily.   doxazosin 1 MG tablet Commonly known as:  CARDURA TAKE 1 TABLET BY MOUTH ONCE DAILY. What changed:  See the new instructions.   HYDROcodone-acetaminophen 7.5-325 MG tablet Commonly known as:  NORCO Take 1 tablet by mouth every 4 (four) hours as needed for moderate pain. FOR PAIN   isosorbide mononitrate 60 MG 24 hr tablet Commonly known as:  IMDUR TAKE 1 & 1/2 TABLET BY MOUTH DAILY What changed:  See the new instructions.   metoprolol tartrate 25 MG tablet Commonly known as:  LOPRESSOR TAKE 1 TABLET BY MOUTH  TWICE DAILY What changed:  See the new instructions.   nitroGLYCERIN 0.4 MG SL tablet Commonly known as:  NITROSTAT Place 1 tablet (0.4 mg total) under the tongue every 5 (five) minutes as needed for chest pain.   pantoprazole 40 MG tablet Commonly known as:  PROTONIX TAKE 1 TABLET BY MOUTH TWICE DAILY What changed:  See the new instructions.   tamsulosin 0.4 MG Caps capsule Commonly known as:  FLOMAX Take 0.4 mg by mouth daily.            Discharge Care Instructions        Start  Ordered   06/21/17 0000  vitamin B-12 1000 MCG tablet  Daily     06/20/17 1519   06/20/17 0000  Increase activity slowly     06/20/17 1519   06/20/17 0000  Diet - low sodium heart healthy     06/20/17 1519   06/20/17 0000  Discharge instructions    Comments:  Home health   06/20/17 1519     Follow-up Information    [name], MD Follow up in 1 week(s).   Specialty:  Family Medicine Contact information: [address] [place] [address] [phone]            Time coordinating discharge: 35 min  Signed:  [place]   [facility] 06/20/2017, 3:23 PM

## 2017-06-20 NOTE — Consult Note (Signed)
[facility] Consult   Reason for Consult:  AMS Referring Physician:  Dr. Eliseo Squires Patient Identification: [name] MRN:  [id number] Principal Diagnosis: Acute encephalopathy Diagnosis:   Patient Active Problem List   Diagnosis Date Noted  . B12 deficiency [E53.8] 06/19/2017  . Acute encephalopathy [G93.40] 06/18/2017  . Coronary artery disease due to lipid rich plaque [I25.10, I25.83]   . Chest pain [R07.9] 11/14/2015  . Unstable angina ([place]) [I20.0] 11/14/2015  . Essential hypertension [I10]   . Coronary artery disease involving native coronary artery of native heart with angina pectoris ([place]) [I25.119]   . Bladder outlet obstruction [N32.0]   . Hypertensive urgency [I16.0] 06/17/2015  . GI bleed [K92.2] 03/03/2014  . Anemia [D64.9] 03/03/2014  . Generalized weakness [R53.1] 03/03/2014  . Chest pain, mid sternal [R07.89] 01/18/2013  . Chest pain with moderate risk of acute coronary syndrome [R07.9] 04/16/2012  . ESOPHAGEAL STRICTURE [K22.2] 09/20/2010  . ORTHOSTATIC DIZZINESS [R42] 08/30/2010  . IBS [K58.9] 08/26/2010  . RECTAL BLEEDING [K62.5] 08/26/2010  . RECTAL PAIN [K62.89] 08/26/2010  . ARTHRITIS [M12.9] 08/26/2010  . LOSS OF APPETITE [R63.0] 08/26/2010  . NAUSEA AND VOMITING [R11.2] 08/26/2010  . OTHER DYSPHAGIA [R13.19] 08/26/2010  . Abdominal pain, generalized [R10.84] 08/26/2010  . COLONIC POLYPS, HX OF [Z86.010] 08/26/2010  . ANAL FISSURE, HX OF [Z87.2] 08/26/2010  . PALPITATIONS, HX OF [Z86.79] 08/18/2010  . PERIPHERAL NEUROPATHY [G60.9] 05/01/2009  . MYOCARDIAL INFARCTION [I21.9] 05/01/2009  . ALLERGIC RHINITIS, SEASONAL [J30.1] 05/01/2009  . DEPRESSION, HX OF [Z86.59] 05/01/2009  . Personal history of other diseases of digestive system [Z87.19] 05/01/2009  . NEPHROLITHIASIS, HX OF [Z87.442] 05/01/2009  . BENIGN PROSTATIC HYPERTROPHY, HX OF [Z87.898] 05/01/2009  . LAMINECTOMY, HX OF [Z98.89] 05/01/2009  . Hyperlipidemia [E78.5] 10/20/2008   . ESSENTIAL HYPERTENSION [I10] 10/20/2008  . CAD- RCA BMS '02, Dx HSRA June 2012 [I25.10] 10/20/2008  . ASTHMA, UNSPECIFIED, UNSPECIFIED STATUS [J45.909] 10/20/2008  . GERD [K21.9] 10/20/2008  . DYSPNEA [R06.02] 10/20/2008    Total Time spent with patient: 1 hour  Subjective:   [name] is a 83 y.o. male patient admitted with AMS.  HPI:  [name] Ayoub Robertsis a 83 y.o.male, seen, chart reviewed and case discussed with the staff RN for this face-to-face psychiatric consultation, evaluation of altered mental status.Patient appeared sitting in a chair next to the bed and staff RN has been cleaning up the urine which he had an accident. Patient reportedly feeling fine this morning without significant emotional or behavioral problems. Patient is able to orient himself, place and person. Patient denied symptoms of depression, anxiety, auditory/visual hallucinations, delusions and paranoia. Patient denied suicidal/homicidal ideation, intention or plans. Patient is a poor historian and unable to remember his medical problems and treatment needs. Patient denied mental health problems and inpatient psychiatric hospitalization. Review of the medical records indicated patient has depression as a list of risk conditions. Patient reported his brother brought him to the hospital but he does not know why except his need to be checked out medically that he is okay. During my visit patient was observed talking with somebody from home and the phone appropriately. Patient responses are more appropriate during my evaluation.  Medical history: Patient with history of CAD status post stenting, chronic anemia, hypertension, history of dysphagia, status post dilatation, chronic chest pains was found to be confused at home when patient's son went to check on him this evening. At around 4 PM patient's son went to check on him and since patient  was not opening the door patient's son uses on case open and found patient  lying on the bed sleeping and found it difficult to arouse. On awaking patient was found to be confused and was brought to the ER.  Past Psychiatric History: depression, has no known inpatient psychiatric hospitalization.  Risk to Self: Is patient at risk for suicide?: No Risk to Others:   Prior Inpatient Therapy:   Prior Outpatient Therapy:    Past Medical History:  Past Medical History:  Diagnosis Date  . Arthritis    "hips, back" (06/17/2015)  . Asthma   . Chronic chest pain   . Chronic lower back pain   . Coronary artery disease    a. s/p BMS to RCA in 2002; b. s/p cutting balloon POBA ;   c. cath 6/12: oDx 80% (treated with repeat cutting balloon POBA), mLAD 50% with 30-40% at Dx, CFX 30%, pRCA 25% with patent stents;  d.  Lex MV 4/14:  Low Risk - EF 61%, inf scar with peri-infarct ischemia  . Dyspnea    chronic  . GERD (gastroesophageal reflux disease)    h/o esophageal spasm  . Headache   . History of blood transfusion    "related to OR"  . History of hiatal hernia   . Hyperlipidemia   . Hypertension   . Melanoma of lower back ([place]) late 1990's  . Memory loss   . Myocardial infarction ([place]) 2001   x 1, confirned 1 possible    Past Surgical History:  Procedure Laterality Date  . ANTERIOR CERVICAL DECOMP/DISCECTOMY FUSION    . BACK SURGERY  multiple  . CARDIAC CATHETERIZATION     "I've had 17 caths" (06/17/2015)  . CATARACT EXTRACTION W/ INTRAOCULAR LENS  IMPLANT, BILATERAL Bilateral   . CERVICAL DISC SURGERY  multiple  . COLONOSCOPY WITH PROPOFOL N/A 04/17/2014   Procedure: COLONOSCOPY WITH PROPOFOL;  Surgeon: Winfield Cunas., MD;  Location: [facility] ENDOSCOPY;  Service: Endoscopy;  Laterality: N/A;  . CORONARY ANGIOPLASTY WITH STENT PLACEMENT  x 2 stents    previous percutaneous intervention on the  RCA and the diagonal branch  . ESOPHAGOGASTRODUODENOSCOPY  03/23/2012   Procedure: ESOPHAGOGASTRODUODENOSCOPY (EGD);  Surgeon: Winfield Cunas., MD;  Location: [facility]  ENDOS[facility];  Service: Endoscopy;  Laterality: N/A;  . ESOPHAGOGASTRODUODENOSCOPY N/A 03/04/2014   Procedure: ESOPHAGOGASTRODUODENOSCOPY (EGD);  Surgeon: Arta Silence, MD;  Location: [facility] ENDOSCOPY;  Service: Endoscopy;  Laterality: N/A;  . LAMINECTOMY    . LEFT HEART CATHETERIZATION WITH CORONARY ANGIOGRAM N/A 01/13/2014   Procedure: LEFT HEART CATHETERIZATION WITH CORONARY ANGIOGRAM;  Surgeon: Blane Ohara, MD;  Location: [facility] CATH LAB;  Service: Cardiovascular;  Laterality: N/A;  . MELANOMA EXCISION  late 1990's   "lower back"  . POSTERIOR LAMINECTOMY / DECOMPRESSION CERVICAL SPINE    . SAVORY DILATION  03/23/2012   Procedure: SAVORY DILATION;  Surgeon: Winfield Cunas., MD;  Location: [facility] ENDOSCOPY;  Service: Endoscopy;  Laterality: N/A;  . TONSILLECTOMY  1930's   Family History:  Family History  Problem Relation Age of Onset  . Diabetes Father   . Heart disease Father   . Asthma Father   . Heart disease Mother        CABG hx age 18  . Colon cancer Son        hx   . Colitis Son        hx  . Crohn's disease Son   . Prostate cancer Paternal Grandfather  Family Psychiatric  History: Unknown Social History:  History  Alcohol Use No     History  Drug Use No    Social History   Social History  . Marital status: Widowed    Spouse name: N/A  . Number of children: 5  . Years of education: 97   Occupational History  . retired Retired    Clinical research associate   Social History Main Topics  . Smoking status: Never Smoker  . Smokeless tobacco: Never Used  . Alcohol use No  . Drug use: No  . Sexual activity: No   Other Topics Concern  . None   Social History Narrative   Lives in [place]. Generaly active around the house and walks his dog without chest pain or SOB.   Right-handed.   Lives alone.   No caffeine use.   Additional Social History:    Allergies:   Allergies  Allergen Reactions  . Budesonide-Formoterol Fumarate Swelling    Mouth and tongue  swelling.  . Iohexol Shortness Of Breath and Itching    Pt was given 100cc of Omnipaque 300 followed by itching/ dyspnea.  . Simvastatin Other (See Comments)    REACTION: unknown  . Demerol [Meperidine] Other (See Comments)    REACTION: Hallucinations  . Ivp Dye [Iodinated Diagnostic Agents] Other (See Comments)    Iodine pt had a reaction when he had a CT DONE  . Meperidine Hcl Other (See Comments)    REACTION: Hallucinations  . Tape Other (See Comments)    Tears skin off, Please use "paper" tape  . Naproxen Swelling  . Pregabalin Rash  . Sulfonamide Derivatives Rash    Labs:  Results for orders placed or performed during the hospital encounter of 06/18/17 (from the past 48 hour(s))  Basic metabolic panel     Status: Abnormal   Collection Time: 06/18/17  5:21 PM  Result Value Ref Range   Sodium 137 135 - 145 mmol/L   Potassium 3.8 3.5 - 5.1 mmol/L   Chloride 102 101 - 111 mmol/L   CO2 26 22 - 32 mmol/L   Glucose, Bld 104 (H) 65 - 99 mg/dL   BUN 19 6 - 20 mg/dL   Creatinine, Ser 1.14 0.61 - 1.24 mg/dL   Calcium 9.6 8.9 - 10.3 mg/dL   GFR calc non Af Amer 57 (L) >60 mL/min   GFR calc Af Amer >60 >60 mL/min    Comment: (NOTE) The eGFR has been calculated using the CKD EPI equation. This calculation has not been validated in all clinical situations. eGFR's persistently <60 mL/min signify possible Chronic Kidney Disease.    Anion gap 9 5 - 15  CBC     Status: Abnormal   Collection Time: 06/18/17  5:21 PM  Result Value Ref Range   WBC 6.5 4.0 - 10.5 K/uL   RBC 4.21 (L) 4.22 - 5.81 MIL/uL   Hemoglobin 12.5 (L) 13.0 - 17.0 g/dL   HCT 37.8 (L) 39.0 - 52.0 %   MCV 89.8 78.0 - 100.0 fL   MCH 29.7 26.0 - 34.0 pg   MCHC 33.1 30.0 - 36.0 g/dL   RDW 14.1 11.5 - 15.5 %   Platelets 260 150 - 400 K/uL  Urinalysis, Routine w reflex microscopic     Status: Abnormal   Collection Time: 06/18/17  8:01 PM  Result Value Ref Range   Color, Urine YELLOW YELLOW   APPearance CLEAR CLEAR    Specific Gravity, Urine 1.024 1.005 - 1.030  pH 5.0 5.0 - 8.0   Glucose, UA NEGATIVE NEGATIVE mg/dL   Hgb urine dipstick SMALL (A) NEGATIVE   Bilirubin Urine NEGATIVE NEGATIVE   Ketones, ur NEGATIVE NEGATIVE mg/dL   Protein, ur NEGATIVE NEGATIVE mg/dL   Nitrite NEGATIVE NEGATIVE   Leukocytes, UA NEGATIVE NEGATIVE   RBC / HPF 0-5 0 - 5 RBC/hpf   WBC, UA NONE SEEN 0 - 5 WBC/hpf   Bacteria, UA NONE SEEN NONE SEEN   Squamous Epithelial / LPF 0-5 (A) NONE SEEN   Mucus PRESENT    Hyaline Casts, UA PRESENT   Ethanol     Status: None   Collection Time: 06/18/17  8:01 PM  Result Value Ref Range   Alcohol, Ethyl (B) <5 <5 mg/dL    Comment:        LOWEST DETECTABLE LIMIT FOR SERUM ALCOHOL IS 5 mg/dL FOR MEDICAL PURPOSES ONLY   Urine rapid drug screen (hosp performed)     Status: Abnormal   Collection Time: 06/18/17  8:01 PM  Result Value Ref Range   Opiates POSITIVE (A) NONE DETECTED   Cocaine NONE DETECTED NONE DETECTED   Benzodiazepines NONE DETECTED NONE DETECTED   Amphetamines NONE DETECTED NONE DETECTED   Tetrahydrocannabinol NONE DETECTED NONE DETECTED   Barbiturates NONE DETECTED NONE DETECTED    Comment:        DRUG SCREEN FOR MEDICAL PURPOSES ONLY.  IF CONFIRMATION IS NEEDED FOR ANY PURPOSE, NOTIFY LAB WITHIN 5 DAYS.        LOWEST DETECTABLE LIMITS FOR URINE DRUG SCREEN Drug Class       Cutoff (ng/mL) Amphetamine      1000 Barbiturate      200 Benzodiazepine   893 Tricyclics       734 Opiates          300 Cocaine          300 THC              50   Troponin I     Status: Abnormal   Collection Time: 06/18/17  8:01 PM  Result Value Ref Range   Troponin I 0.03 (HH) <0.03 ng/mL    Comment: CRITICAL RESULT CALLED TO, READ BACK BY AND VERIFIED WITH: STRAUGHAN C,RN 06/18/17 2102 WAYK   TSH     Status: None   Collection Time: 06/18/17  8:01 PM  Result Value Ref Range   TSH 0.974 0.350 - 4.500 uIU/mL    Comment: Performed by a 3rd Generation assay with a  functional sensitivity of <=0.01 uIU/mL.  Troponin I     Status: Abnormal   Collection Time: 06/19/17 12:01 AM  Result Value Ref Range   Troponin I 0.03 (HH) <0.03 ng/mL    Comment: CRITICAL VALUE NOTED.  VALUE IS CONSISTENT WITH PREVIOUSLY REPORTED AND CALLED VALUE.  Ammonia     Status: None   Collection Time: 06/19/17 12:01 AM  Result Value Ref Range   Ammonia 26 9 - 35 umol/L  Vitamin B12     Status: None   Collection Time: 06/19/17 12:01 AM  Result Value Ref Range   Vitamin B-12 254 180 - 914 pg/mL    Comment: (NOTE) This assay is not validated for testing neonatal or myeloproliferative syndrome specimens for Vitamin B12 levels.   Folate     Status: None   Collection Time: 06/19/17 12:01 AM  Result Value Ref Range   Folate 24.0 >5.9 ng/mL  RPR     Status: None  Collection Time: 06/19/17 12:01 AM  Result Value Ref Range   RPR Ser Ql Non Reactive Non Reactive    Comment: (NOTE) Performed At: [facility] [place], [place] [id number] Lindon Romp MD [id number]   Troponin I (q 6hr x 3)     Status: None   Collection Time: 06/19/17  5:39 AM  Result Value Ref Range   Troponin I <0.03 <0.03 ng/mL  Basic metabolic panel     Status: Abnormal   Collection Time: 06/19/17  5:39 AM  Result Value Ref Range   Sodium 137 135 - 145 mmol/L   Potassium 3.9 3.5 - 5.1 mmol/L   Chloride 104 101 - 111 mmol/L   CO2 27 22 - 32 mmol/L   Glucose, Bld 109 (H) 65 - 99 mg/dL   BUN 18 6 - 20 mg/dL   Creatinine, Ser 1.02 0.61 - 1.24 mg/dL   Calcium 9.1 8.9 - 10.3 mg/dL   GFR calc non Af Amer >60 >60 mL/min   GFR calc Af Amer >60 >60 mL/min    Comment: (NOTE) The eGFR has been calculated using the CKD EPI equation. This calculation has not been validated in all clinical situations. eGFR's persistently <60 mL/min signify possible Chronic Kidney Disease.    Anion gap 6 5 - 15  CBC     Status: Abnormal   Collection Time: 06/19/17  5:39 AM  Result Value Ref Range    WBC 6.3 4.0 - 10.5 K/uL   RBC 3.79 (L) 4.22 - 5.81 MIL/uL   Hemoglobin 11.1 (L) 13.0 - 17.0 g/dL   HCT 34.0 (L) 39.0 - 52.0 %   MCV 89.7 78.0 - 100.0 fL   MCH 29.3 26.0 - 34.0 pg   MCHC 32.6 30.0 - 36.0 g/dL   RDW 14.0 11.5 - 15.5 %   Platelets 221 150 - 400 K/uL  Hepatic function panel     Status: Abnormal   Collection Time: 06/19/17  5:39 AM  Result Value Ref Range   Total Protein 5.6 (L) 6.5 - 8.1 g/dL   Albumin 2.9 (L) 3.5 - 5.0 g/dL   AST 22 15 - 41 U/L   ALT 17 17 - 63 U/L   Alkaline Phosphatase 84 38 - 126 U/L   Total Bilirubin 0.5 0.3 - 1.2 mg/dL   Bilirubin, Direct 0.2 0.1 - 0.5 mg/dL   Indirect Bilirubin 0.3 0.3 - 0.9 mg/dL  Troponin I (q 6hr x 3)     Status: None   Collection Time: 06/19/17 11:39 AM  Result Value Ref Range   Troponin I <0.03 <0.03 ng/mL    Current Facility-Administered Medications  Medication Dose Route Frequency Provider Last Rate Last Dose  . 0.9 %  sodium chloride infusion   Intravenous Continuous Eulogio Bear U, DO 50 mL/hr at 06/19/17 2246    . acetaminophen (TYLENOL) tablet 650 mg  650 mg Oral Q6H PRN Rise Patience, MD       Or  . acetaminophen (TYLENOL) suppository 650 mg  650 mg Rectal Q6H PRN Rise Patience, MD      . amLODipine (NORVASC) tablet 5 mg  5 mg Oral Daily Vann, Jessica U, DO   5 mg at 06/20/17 1106  . aspirin EC tablet 81 mg  81 mg Oral Daily Rise Patience, MD   81 mg at 06/20/17 1107  . atorvastatin (LIPITOR) tablet 10 mg  10 mg Oral Daily Rise Patience, MD   10 mg at  06/20/17 1107  . clopidogrel (PLAVIX) tablet 75 mg  75 mg Oral Daily Rise Patience, MD   75 mg at 06/20/17 1107  . doxazosin (CARDURA) tablet 1 mg  1 mg Oral Daily Rise Patience, MD   1 mg at 06/20/17 1107  . feeding supplement (ENSURE ENLIVE) (ENSURE ENLIVE) liquid 237 mL  237 mL Oral BID BM Rise Patience, MD   237 mL at 06/20/17 1000  . haloperidol lactate (HALDOL) injection 2 mg  2 mg Intravenous Q6H PRN Eulogio Bear U, DO   2 mg at 06/19/17 1501  . HYDROcodone-acetaminophen (NORCO) 7.5-325 MG per tablet 1 tablet  1 tablet Oral Q4H PRN Rise Patience, MD   1 tablet at 06/19/17 0128  . isosorbide mononitrate (IMDUR) 24 hr tablet 90 mg  90 mg Oral Daily Eulogio Bear U, DO   90 mg at 06/20/17 1107  . metoprolol tartrate (LOPRESSOR) tablet 25 mg  25 mg Oral BID Rise Patience, MD   25 mg at 06/20/17 1107  . nitroGLYCERIN (NITROSTAT) SL tablet 0.4 mg  0.4 mg Sublingual Q5 min PRN Rise Patience, MD      . ondansetron [facility]) tablet 4 mg  4 mg Oral Q6H PRN Rise Patience, MD       Or  . ondansetron [facility]) injection 4 mg  4 mg Intravenous Q6H PRN Rise Patience, MD      . pantoprazole (PROTONIX) EC tablet 40 mg  40 mg Oral BID Rise Patience, MD   40 mg at 06/20/17 1106  . tamsulosin (FLOMAX) capsule 0.4 mg  0.4 mg Oral Daily Rise Patience, MD   0.4 mg at 06/20/17 1106  . vitamin B-12 (CYANOCOBALAMIN) tablet 1,000 mcg  1,000 mcg Oral Daily Eulogio Bear U, DO   1,000 mcg at 06/20/17 1107    Musculoskeletal: Strength & Muscle Tone: decreased Gait & Station: unsteady Patient leans: N/A  Psychiatric Specialty Exam: Physical Exam as per history and physical   ROS generalized weakness, mild confusion and forgetfulness. Patient reportedly uses cane at home and does not required walker. No Fever-chills, No Headache, No changes with Vision or hearing, reports vertigo No problems swallowing food or Liquids, No Chest pain, Cough or Shortness of Breath, No Abdominal pain, No Nausea or Vommitting, Bowel movements are regular, No Blood in stool or Urine, No dysuria, No new skin rashes or bruises, No new joints pains-aches,  No new weakness, tingling, numbness in any extremity, No recent weight gain or loss, No polyuria, polydypsia or polyphagia,  A full 10 point Review of Systems was done, except as stated above, all other Review of Systems were negative.   Blood pressure (!) 154/77, pulse 86, temperature 98.4 F (36.9 C), temperature source Oral, resp. rate 18, height _0  (1.753 m), weight 68.8 kg (151 lb 9.6 oz), SpO2 98 %.Body mass index is 22.39 kg/m.  General Appearance: Casual  Eye Contact:  Good  Speech:  Clear and Coherent  Volume:  Decreased  Mood:  Euthymic  Affect:  Appropriate and Congruent  Thought Process:  Coherent and Goal Directed  Orientation:  Full (Time, Place, and Person)  Thought Content:  Logical  Suicidal Thoughts:  No  Homicidal Thoughts:  No  Memory:  Immediate;   Fair Recent;   Fair Remote;   Fair  Judgement:  Fair  Insight:  Fair  Psychomotor Activity:  Decreased  Concentration:  Concentration: Fair and Attention Span: Fair  Recall:  [place] of Knowledge:  Good  Language:  Good  Akathisia:  Negative  Handed:  Right  AIMS (if indicated):     Assets:  Communication Skills Desire for Improvement Financial Resources/Insurance Housing Intimacy Leisure Time Resilience Social Support Transportation  ADL's:  Impaired  Cognition:  Impaired,  Mild  Sleep:        Treatment Plan Summary: 83 years old male presented with altered mental status the emergency department and slowly started regaining his orientation, concentration, memory and language functions.  Patient denied symptoms of depression, anxiety, psychosis, suicidal and homicidal ideation, intention or plans. Reportedly patient son reported that patient carries of Pistol all the time but patient denies.  Patient has mild memory deficits probably is related or early state of dementia.  Recommended no psychotropic medication at this time.  Appreciate psychiatric consultation and we sign off as of today Please contact [phone] or [phone] if needs further assistance   Disposition: No evidence of imminent risk to self or others at present.   Supportive therapy provided about ongoing stressors.  Ambrose Finland, MD 06/20/2017  12:24 PM

## 2017-07-17 ENCOUNTER — Other Ambulatory Visit: Payer: Self-pay | Admitting: Physician Assistant

## 2017-07-31 ENCOUNTER — Emergency Department: Payer: Medicare Other

## 2017-07-31 ENCOUNTER — Observation Stay
Admission: EM | Admit: 2017-07-31 | Discharge: 2017-08-02 | Disposition: A | Discharge status: Home / Self Care | Payer: Medicare Other | Attending: Internal Medicine | Admitting: Internal Medicine

## 2017-07-31 ENCOUNTER — Encounter: Payer: Self-pay | Admitting: Emergency Medicine

## 2017-07-31 DIAGNOSIS — M479 Spondylosis, unspecified: Secondary | ICD-10-CM | POA: Diagnosis not present

## 2017-07-31 DIAGNOSIS — R55 Syncope and collapse: Secondary | ICD-10-CM | POA: Diagnosis present

## 2017-07-31 DIAGNOSIS — Z9841 Cataract extraction status, right eye: Secondary | ICD-10-CM | POA: Insufficient documentation

## 2017-07-31 DIAGNOSIS — I252 Old myocardial infarction: Secondary | ICD-10-CM | POA: Diagnosis not present

## 2017-07-31 DIAGNOSIS — S0081XA Abrasion of other part of head, initial encounter: Secondary | ICD-10-CM | POA: Diagnosis not present

## 2017-07-31 DIAGNOSIS — Z7982 Long term (current) use of aspirin: Secondary | ICD-10-CM | POA: Diagnosis not present

## 2017-07-31 DIAGNOSIS — G629 Polyneuropathy, unspecified: Secondary | ICD-10-CM | POA: Insufficient documentation

## 2017-07-31 DIAGNOSIS — S0083XA Contusion of other part of head, initial encounter: Secondary | ICD-10-CM | POA: Diagnosis not present

## 2017-07-31 DIAGNOSIS — R748 Abnormal levels of other serum enzymes: Secondary | ICD-10-CM | POA: Diagnosis not present

## 2017-07-31 DIAGNOSIS — E876 Hypokalemia: Secondary | ICD-10-CM | POA: Insufficient documentation

## 2017-07-31 DIAGNOSIS — Z882 Allergy status to sulfonamides status: Secondary | ICD-10-CM | POA: Insufficient documentation

## 2017-07-31 DIAGNOSIS — W19XXXA Unspecified fall, initial encounter: Secondary | ICD-10-CM | POA: Diagnosis not present

## 2017-07-31 DIAGNOSIS — Z955 Presence of coronary angioplasty implant and graft: Secondary | ICD-10-CM | POA: Insufficient documentation

## 2017-07-31 DIAGNOSIS — Z8042 Family history of malignant neoplasm of prostate: Secondary | ICD-10-CM | POA: Insufficient documentation

## 2017-07-31 DIAGNOSIS — N320 Bladder-neck obstruction: Secondary | ICD-10-CM | POA: Insufficient documentation

## 2017-07-31 DIAGNOSIS — I951 Orthostatic hypotension: Secondary | ICD-10-CM

## 2017-07-31 DIAGNOSIS — K922 Gastrointestinal hemorrhage, unspecified: Secondary | ICD-10-CM | POA: Insufficient documentation

## 2017-07-31 DIAGNOSIS — Z888 Allergy status to other drugs, medicaments and biological substances status: Secondary | ICD-10-CM | POA: Insufficient documentation

## 2017-07-31 DIAGNOSIS — Z23 Encounter for immunization: Secondary | ICD-10-CM | POA: Insufficient documentation

## 2017-07-31 DIAGNOSIS — M160 Bilateral primary osteoarthritis of hip: Secondary | ICD-10-CM | POA: Insufficient documentation

## 2017-07-31 DIAGNOSIS — N400 Enlarged prostate without lower urinary tract symptoms: Secondary | ICD-10-CM | POA: Insufficient documentation

## 2017-07-31 DIAGNOSIS — S51011A Laceration without foreign body of right elbow, initial encounter: Secondary | ICD-10-CM | POA: Insufficient documentation

## 2017-07-31 DIAGNOSIS — Z833 Family history of diabetes mellitus: Secondary | ICD-10-CM | POA: Insufficient documentation

## 2017-07-31 DIAGNOSIS — R51 Headache: Secondary | ICD-10-CM | POA: Insufficient documentation

## 2017-07-31 DIAGNOSIS — I700 Atherosclerosis of aorta: Secondary | ICD-10-CM | POA: Insufficient documentation

## 2017-07-31 DIAGNOSIS — T148XXA Other injury of unspecified body region, initial encounter: Secondary | ICD-10-CM

## 2017-07-31 DIAGNOSIS — I2583 Coronary atherosclerosis due to lipid rich plaque: Secondary | ICD-10-CM | POA: Insufficient documentation

## 2017-07-31 DIAGNOSIS — N179 Acute kidney failure, unspecified: Secondary | ICD-10-CM | POA: Diagnosis not present

## 2017-07-31 DIAGNOSIS — E538 Deficiency of other specified B group vitamins: Secondary | ICD-10-CM | POA: Diagnosis not present

## 2017-07-31 DIAGNOSIS — Z800 Family history of malignant neoplasm of digestive organs: Secondary | ICD-10-CM | POA: Insufficient documentation

## 2017-07-31 DIAGNOSIS — K219 Gastro-esophageal reflux disease without esophagitis: Secondary | ICD-10-CM | POA: Diagnosis not present

## 2017-07-31 DIAGNOSIS — G9340 Encephalopathy, unspecified: Secondary | ICD-10-CM | POA: Insufficient documentation

## 2017-07-31 DIAGNOSIS — E860 Dehydration: Secondary | ICD-10-CM | POA: Diagnosis present

## 2017-07-31 DIAGNOSIS — I10 Essential (primary) hypertension: Secondary | ICD-10-CM | POA: Insufficient documentation

## 2017-07-31 DIAGNOSIS — I2510 Atherosclerotic heart disease of native coronary artery without angina pectoris: Secondary | ICD-10-CM | POA: Insufficient documentation

## 2017-07-31 DIAGNOSIS — Z85820 Personal history of malignant melanoma of skin: Secondary | ICD-10-CM | POA: Insufficient documentation

## 2017-07-31 DIAGNOSIS — K589 Irritable bowel syndrome without diarrhea: Secondary | ICD-10-CM | POA: Insufficient documentation

## 2017-07-31 DIAGNOSIS — I491 Atrial premature depolarization: Secondary | ICD-10-CM | POA: Insufficient documentation

## 2017-07-31 DIAGNOSIS — M1611 Unilateral primary osteoarthritis, right hip: Secondary | ICD-10-CM | POA: Insufficient documentation

## 2017-07-31 DIAGNOSIS — Z86010 Personal history of colonic polyps: Secondary | ICD-10-CM | POA: Insufficient documentation

## 2017-07-31 DIAGNOSIS — K449 Diaphragmatic hernia without obstruction or gangrene: Secondary | ICD-10-CM | POA: Diagnosis not present

## 2017-07-31 DIAGNOSIS — Z9842 Cataract extraction status, left eye: Secondary | ICD-10-CM | POA: Insufficient documentation

## 2017-07-31 DIAGNOSIS — Z8379 Family history of other diseases of the digestive system: Secondary | ICD-10-CM | POA: Insufficient documentation

## 2017-07-31 DIAGNOSIS — R778 Other specified abnormalities of plasma proteins: Secondary | ICD-10-CM

## 2017-07-31 DIAGNOSIS — R413 Other amnesia: Secondary | ICD-10-CM | POA: Insufficient documentation

## 2017-07-31 DIAGNOSIS — E785 Hyperlipidemia, unspecified: Secondary | ICD-10-CM | POA: Diagnosis not present

## 2017-07-31 DIAGNOSIS — Z91018 Allergy to other foods: Secondary | ICD-10-CM | POA: Insufficient documentation

## 2017-07-31 DIAGNOSIS — Z981 Arthrodesis status: Secondary | ICD-10-CM | POA: Insufficient documentation

## 2017-07-31 DIAGNOSIS — J45909 Unspecified asthma, uncomplicated: Secondary | ICD-10-CM | POA: Insufficient documentation

## 2017-07-31 DIAGNOSIS — Z885 Allergy status to narcotic agent status: Secondary | ICD-10-CM | POA: Insufficient documentation

## 2017-07-31 DIAGNOSIS — D649 Anemia, unspecified: Secondary | ICD-10-CM | POA: Insufficient documentation

## 2017-07-31 DIAGNOSIS — R7989 Other specified abnormal findings of blood chemistry: Secondary | ICD-10-CM

## 2017-07-31 DIAGNOSIS — M1612 Unilateral primary osteoarthritis, left hip: Secondary | ICD-10-CM | POA: Insufficient documentation

## 2017-07-31 DIAGNOSIS — Z825 Family history of asthma and other chronic lower respiratory diseases: Secondary | ICD-10-CM | POA: Insufficient documentation

## 2017-07-31 DIAGNOSIS — Z8249 Family history of ischemic heart disease and other diseases of the circulatory system: Secondary | ICD-10-CM | POA: Insufficient documentation

## 2017-07-31 DIAGNOSIS — Z7902 Long term (current) use of antithrombotics/antiplatelets: Secondary | ICD-10-CM | POA: Insufficient documentation

## 2017-07-31 DIAGNOSIS — N200 Calculus of kidney: Secondary | ICD-10-CM | POA: Insufficient documentation

## 2017-07-31 DIAGNOSIS — N289 Disorder of kidney and ureter, unspecified: Secondary | ICD-10-CM

## 2017-07-31 DIAGNOSIS — K029 Dental caries, unspecified: Secondary | ICD-10-CM | POA: Insufficient documentation

## 2017-07-31 DIAGNOSIS — R0600 Dyspnea, unspecified: Secondary | ICD-10-CM | POA: Diagnosis not present

## 2017-07-31 DIAGNOSIS — F329 Major depressive disorder, single episode, unspecified: Secondary | ICD-10-CM | POA: Insufficient documentation

## 2017-07-31 HISTORY — DX: Syncope and collapse: R55

## 2017-07-31 LAB — URINALYSIS, COMPLETE (UACMP) WITH MICROSCOPIC
Bacteria, UA: NONE SEEN
Bilirubin Urine: NEGATIVE
GLUCOSE, UA: NEGATIVE mg/dL
KETONES UR: NEGATIVE mg/dL
Leukocytes, UA: NEGATIVE
NITRITE: NEGATIVE
PH: 5 (ref 5.0–8.0)
Protein, ur: NEGATIVE mg/dL
Specific Gravity, Urine: 1.008 (ref 1.005–1.030)
Squamous Epithelial / LPF: NONE SEEN

## 2017-07-31 LAB — CBC
HEMATOCRIT: 36.2 % — AB (ref 40.0–52.0)
HEMOGLOBIN: 12.4 g/dL — AB (ref 13.0–18.0)
MCH: 30.1 pg (ref 26.0–34.0)
MCHC: 34.2 g/dL (ref 32.0–36.0)
MCV: 88.1 fL (ref 80.0–100.0)
Platelets: 236 10*3/uL (ref 150–440)
RBC: 4.11 MIL/uL — ABNORMAL LOW (ref 4.40–5.90)
RDW: 14.8 % — ABNORMAL HIGH (ref 11.5–14.5)
WBC: 6.8 10*3/uL (ref 3.8–10.6)

## 2017-07-31 LAB — COMPREHENSIVE METABOLIC PANEL
ALK PHOS: 87 U/L (ref 38–126)
ALT: 21 U/L (ref 17–63)
ANION GAP: 11 (ref 5–15)
AST: 30 U/L (ref 15–41)
Albumin: 3.7 g/dL (ref 3.5–5.0)
BUN: 27 mg/dL — ABNORMAL HIGH (ref 6–20)
CALCIUM: 9.2 mg/dL (ref 8.9–10.3)
CO2: 33 mmol/L — ABNORMAL HIGH (ref 22–32)
Chloride: 91 mmol/L — ABNORMAL LOW (ref 101–111)
Creatinine, Ser: 2.3 mg/dL — ABNORMAL HIGH (ref 0.61–1.24)
GFR, EST AFRICAN AMERICAN: 28 mL/min — AB (ref >60)
GFR, EST NON AFRICAN AMERICAN: 24 mL/min — AB (ref >60)
Glucose, Bld: 146 mg/dL — ABNORMAL HIGH (ref 65–99)
Potassium: 2.9 mmol/L — ABNORMAL LOW (ref 3.5–5.1)
SODIUM: 135 mmol/L (ref 135–145)
TOTAL PROTEIN: 6.9 g/dL (ref 6.5–8.1)
Total Bilirubin: 1 mg/dL (ref 0.3–1.2)

## 2017-07-31 LAB — TROPONIN I
TROPONIN I: 0.03 ng/mL — AB (ref <0.03)
TROPONIN I: 0.03 ng/mL — AB (ref <0.03)
TROPONIN I: 0.03 ng/mL — AB (ref <0.03)

## 2017-07-31 LAB — TSH: TSH: 1.594 u[IU]/mL (ref 0.350–4.500)

## 2017-07-31 MED ORDER — CYCLOBENZAPRINE HCL 10 MG PO TABS: 5.0000 mg | ORAL_TABLET | Freq: Three times a day (TID) | ORAL | Status: DC | PRN

## 2017-07-31 MED ORDER — TETANUS-DIPHTH-ACELL PERTUSSIS 5-2.5-18.5 LF-MCG/0.5 IM SUSP
0.5000 mL | Freq: Once | INTRAMUSCULAR | Status: AC
  Administered 2017-07-31: 0.5 mL via INTRAMUSCULAR
  Filled 2017-07-31: qty 0.5

## 2017-07-31 MED ORDER — BACITRACIN ZINC 500 UNIT/GM EX OINT
TOPICAL_OINTMENT | CUTANEOUS | Status: AC
  Filled 2017-07-31: qty 0.9

## 2017-07-31 MED ORDER — ATORVASTATIN CALCIUM 20 MG PO TABS
40.0000 mg | ORAL_TABLET | Freq: Every day | ORAL | Status: DC
  Administered 2017-08-01 – 2017-08-02 (×2): 40 mg via ORAL
  Filled 2017-07-31 (×2): qty 2

## 2017-07-31 MED ORDER — TAMSULOSIN HCL 0.4 MG PO CAPS
0.4000 mg | ORAL_CAPSULE | Freq: Every day | ORAL | Status: DC
  Administered 2017-08-01 – 2017-08-02 (×2): 0.4 mg via ORAL
  Filled 2017-07-31 (×2): qty 1

## 2017-07-31 MED ORDER — ONDANSETRON HCL 4 MG PO TABS: 4.0000 mg | ORAL_TABLET | Freq: Four times a day (QID) | ORAL | Status: DC | PRN

## 2017-07-31 MED ORDER — CLOPIDOGREL BISULFATE 75 MG PO TABS
75.0000 mg | ORAL_TABLET | Freq: Every day | ORAL | Status: DC
  Administered 2017-08-01 – 2017-08-02 (×2): 75 mg via ORAL
  Filled 2017-07-31 (×2): qty 1

## 2017-07-31 MED ORDER — ENOXAPARIN SODIUM 30 MG/0.3ML ~~LOC~~ SOLN
30.0000 mg | SUBCUTANEOUS | Status: DC
  Administered 2017-08-01: 30 mg via SUBCUTANEOUS
  Filled 2017-07-31: qty 0.3

## 2017-07-31 MED ORDER — ISOSORBIDE MONONITRATE ER 60 MG PO TB24
60.0000 mg | ORAL_TABLET | Freq: Every day | ORAL | Status: DC
  Administered 2017-08-01 – 2017-08-02 (×2): 60 mg via ORAL
  Filled 2017-07-31 (×2): qty 1

## 2017-07-31 MED ORDER — PANTOPRAZOLE SODIUM 40 MG PO TBEC
40.0000 mg | DELAYED_RELEASE_TABLET | Freq: Two times a day (BID) | ORAL | Status: DC
  Administered 2017-07-31 – 2017-08-02 (×4): 40 mg via ORAL
  Filled 2017-07-31 (×4): qty 1

## 2017-07-31 MED ORDER — ACETAMINOPHEN 650 MG RE SUPP: 650.0000 mg | Freq: Four times a day (QID) | RECTAL | Status: DC | PRN

## 2017-07-31 MED ORDER — NITROGLYCERIN 0.4 MG SL SUBL: 0.4000 mg | SUBLINGUAL_TABLET | SUBLINGUAL | Status: DC | PRN

## 2017-07-31 MED ORDER — SODIUM CHLORIDE 0.9 % IV BOLUS (SEPSIS)
1000.0000 mL | Freq: Once | INTRAVENOUS | Status: AC
  Administered 2017-07-31: 1000 mL via INTRAVENOUS

## 2017-07-31 MED ORDER — ONDANSETRON HCL 4 MG/2ML IJ SOLN: 4.0000 mg | Freq: Four times a day (QID) | INTRAMUSCULAR | Status: DC | PRN

## 2017-07-31 MED ORDER — ENOXAPARIN SODIUM 40 MG/0.4ML ~~LOC~~ SOLN: 40.0000 mg | SUBCUTANEOUS | Status: DC

## 2017-07-31 MED ORDER — DOCUSATE SODIUM 100 MG PO CAPS
100.0000 mg | ORAL_CAPSULE | Freq: Two times a day (BID) | ORAL | Status: DC
  Administered 2017-07-31 – 2017-08-02 (×4): 100 mg via ORAL
  Filled 2017-07-31 (×4): qty 1

## 2017-07-31 MED ORDER — ACETAMINOPHEN 325 MG PO TABS: 650.0000 mg | ORAL_TABLET | Freq: Four times a day (QID) | ORAL | Status: DC | PRN

## 2017-07-31 MED ORDER — HYDROCODONE-ACETAMINOPHEN 7.5-325 MG PO TABS: 1.0000 | ORAL_TABLET | Freq: Four times a day (QID) | ORAL | Status: DC | PRN

## 2017-07-31 MED ORDER — BACITRACIN ZINC 500 UNIT/GM EX OINT
TOPICAL_OINTMENT | Freq: Two times a day (BID) | CUTANEOUS | Status: DC
  Administered 2017-07-31 – 2017-08-01 (×2): via TOPICAL
  Administered 2017-08-01 – 2017-08-02 (×2): 1 via TOPICAL
  Filled 2017-07-31 (×4): qty 0.9

## 2017-07-31 MED ORDER — POTASSIUM CHLORIDE 10 MEQ/100ML IV SOLN
10.0000 meq | INTRAVENOUS | Status: AC
Start: 2017-07-31 — End: 2017-07-31
  Administered 2017-07-31 (×2): 10 meq via INTRAVENOUS
  Filled 2017-07-31 (×2): qty 100

## 2017-07-31 MED ORDER — METOPROLOL TARTRATE 25 MG PO TABS
25.0000 mg | ORAL_TABLET | Freq: Two times a day (BID) | ORAL | Status: DC
  Administered 2017-07-31 – 2017-08-02 (×4): 25 mg via ORAL
  Filled 2017-07-31 (×4): qty 1

## 2017-07-31 MED ORDER — VITAMIN B-12 1000 MCG PO TABS
1000.0000 ug | ORAL_TABLET | Freq: Every day | ORAL | Status: DC
  Administered 2017-08-01 – 2017-08-02 (×2): 1000 ug via ORAL
  Filled 2017-07-31 (×2): qty 1

## 2017-07-31 MED ORDER — POTASSIUM CHLORIDE IN NACL 40-0.9 MEQ/L-% IV SOLN
INTRAVENOUS | Status: DC
  Administered 2017-07-31 – 2017-08-01 (×2): 100 mL/h via INTRAVENOUS
  Administered 2017-08-01: 75 mL/h via INTRAVENOUS
  Filled 2017-07-31 (×5): qty 1000

## 2017-07-31 MED ORDER — POTASSIUM CHLORIDE CRYS ER 20 MEQ PO TBCR
40.0000 meq | EXTENDED_RELEASE_TABLET | Freq: Once | ORAL | Status: AC
  Administered 2017-07-31: 40 meq via ORAL
  Filled 2017-07-31: qty 2

## 2017-07-31 MED ORDER — ASPIRIN EC 81 MG PO TBEC
81.0000 mg | DELAYED_RELEASE_TABLET | Freq: Every day | ORAL | Status: DC
Start: 2017-08-01 — End: 2017-08-02
  Administered 2017-08-01 – 2017-08-02 (×2): 81 mg via ORAL
  Filled 2017-07-31 (×2): qty 1

## 2017-07-31 MED ORDER — DOXAZOSIN MESYLATE 1 MG PO TABS
1.0000 mg | ORAL_TABLET | Freq: Every day | ORAL | Status: DC
  Filled 2017-07-31: qty 1

## 2017-07-31 MED ORDER — BACITRACIN ZINC 500 UNIT/GM EX OINT
TOPICAL_OINTMENT | CUTANEOUS | Status: AC
  Administered 2017-07-31: 1 via TOPICAL
  Filled 2017-07-31: qty 0.9

## 2017-07-31 MED ORDER — HYDROCODONE-ACETAMINOPHEN 5-325 MG PO TABS
1.0000 | ORAL_TABLET | Freq: Once | ORAL | Status: AC
  Administered 2017-07-31: 1 via ORAL
  Filled 2017-07-31: qty 1

## 2017-07-31 MED ORDER — BACITRACIN ZINC 500 UNIT/GM EX OINT
TOPICAL_OINTMENT | Freq: Two times a day (BID) | CUTANEOUS | Status: DC
  Administered 2017-07-31: 1 via TOPICAL

## 2017-07-31 MED ORDER — INFLUENZA VAC SPLIT HIGH-DOSE 0.5 ML IM SUSY
0.5000 mL | PREFILLED_SYRINGE | INTRAMUSCULAR | Status: AC
  Administered 2017-08-02: 0.5 mL via INTRAMUSCULAR
  Filled 2017-07-31 (×2): qty 0.5

## 2017-07-31 NOTE — ED Triage Notes (Signed)
Pt to ED via EMS from home with c/o mechanical fall, pt unaware of any LOC. Pt A&Ox4 on arrival, VS stable. Pt has lac to RT cheek and side of head. PT takes plavix daily.

## 2017-07-31 NOTE — ED Notes (Signed)
Bacitracin applied to RT elbow skin tear and dressed at this time

## 2017-07-31 NOTE — ED Notes (Signed)
ED Provider at bedside. 

## 2017-07-31 NOTE — ED Provider Notes (Signed)
[facility] Emergency Department Provider Note  ____________________________________________  Time seen: Approximately 4:31 PM  I have reviewed the triage vital signs and the nursing notes.   HISTORY  Chief Complaint Fall    HPI [name] is a 83 y.o. male with a history of CADon Plavix, chronic arthritis,history of GI bleed, HTN, HL, presenting after a fall. The patient reports that he stood up to get his phone when he developed yellowing of his vision without any lightheadedness, chest pain, palpitations, numbness tingling or weakness. He then fell full word and struck his head on the ground. He denies syncope of or loss of consciousness. He does not have a headache, nausea or vomiting, visual changes, speech changes, mental status changes. He reports associated pain over the right zygomatic arch, mid thoracic and lumbar spine, right lateral chest wall, right wrist. He has not tried anything for his pain. He was unable to stand up after his fall and EMS was called.   Past Medical History:  Diagnosis Date  . Arthritis    "hips, back" (06/17/2015)  . Asthma   . Chronic chest pain   . Chronic lower back pain   . Coronary artery disease    a. s/p BMS to RCA in 2002; b. s/p cutting balloon POBA ;   c. cath 6/12: oDx 80% (treated with repeat cutting balloon POBA), mLAD 50% with 30-40% at Dx, CFX 30%, pRCA 25% with patent stents;  d.  Lex MV 4/14:  Low Risk - EF 61%, inf scar with peri-infarct ischemia  . Dyspnea    chronic  . GERD (gastroesophageal reflux disease)    h/o esophageal spasm  . Headache   . History of blood transfusion    "related to OR"  . History of hiatal hernia   . Hyperlipidemia   . Hypertension   . Melanoma of lower back ([place]) late 1990's  . Memory loss   . Myocardial infarction ([place]) 2001   x 1, confirned 1 possible    Patient Active Problem List   Diagnosis Date Noted  . B12 deficiency 06/19/2017  . Acute encephalopathy  06/18/2017  . Coronary artery disease due to lipid rich plaque   . Chest pain 11/14/2015  . Unstable angina ([place]) 11/14/2015  . Essential hypertension   . Coronary artery disease involving native coronary artery of native heart with angina pectoris ([place])   . Bladder outlet obstruction   . Hypertensive urgency 06/17/2015  . GI bleed 03/03/2014  . Anemia 03/03/2014  . Generalized weakness 03/03/2014  . Chest pain, mid sternal 01/18/2013  . Chest pain with moderate risk of acute coronary syndrome 04/16/2012  . ESOPHAGEAL STRICTURE 09/20/2010  . ORTHOSTATIC DIZZINESS 08/30/2010  . IBS 08/26/2010  . RECTAL BLEEDING 08/26/2010  . RECTAL PAIN 08/26/2010  . ARTHRITIS 08/26/2010  . LOSS OF APPETITE 08/26/2010  . NAUSEA AND VOMITING 08/26/2010  . OTHER DYSPHAGIA 08/26/2010  . Abdominal pain, generalized 08/26/2010  . COLONIC POLYPS, HX OF 08/26/2010  . ANAL FISSURE, HX OF 08/26/2010  . PALPITATIONS, HX OF 08/18/2010  . PERIPHERAL NEUROPATHY 05/01/2009  . MYOCARDIAL INFARCTION 05/01/2009  . ALLERGIC RHINITIS, SEASONAL 05/01/2009  . DEPRESSION, HX OF 05/01/2009  . Personal history of other diseases of digestive system 05/01/2009  . NEPHROLITHIASIS, HX OF 05/01/2009  . BENIGN PROSTATIC HYPERTROPHY, HX OF 05/01/2009  . LAMINECTOMY, HX OF 05/01/2009  . Hyperlipidemia 10/20/2008  . ESSENTIAL HYPERTENSION 10/20/2008  . CAD- RCA BMS '02, Dx HSRA June 2012 10/20/2008  .  ASTHMA, UNSPECIFIED, UNSPECIFIED STATUS 10/20/2008  . GERD 10/20/2008  . DYSPNEA 10/20/2008    Past Surgical History:  Procedure Laterality Date  . ANTERIOR CERVICAL DECOMP/DISCECTOMY FUSION    . BACK SURGERY  multiple  . CARDIAC CATHETERIZATION     "I've had 17 caths" (06/17/2015)  . CATARACT EXTRACTION W/ INTRAOCULAR LENS  IMPLANT, BILATERAL Bilateral   . CERVICAL DISC SURGERY  multiple  . COLONOSCOPY WITH PROPOFOL N/A 04/17/2014   Procedure: COLONOSCOPY WITH PROPOFOL;  Surgeon: Winfield Cunas., MD;  Location: [facility]  ENDOSCOPY;  Service: Endoscopy;  Laterality: N/A;  . CORONARY ANGIOPLASTY WITH STENT PLACEMENT  x 2 stents    previous percutaneous intervention on the  RCA and the diagonal branch  . ESOPHAGOGASTRODUODENOSCOPY  03/23/2012   Procedure: ESOPHAGOGASTRODUODENOSCOPY (EGD);  Surgeon: Winfield Cunas., MD;  Location: [facility] ENDOSCOPY;  Service: Endoscopy;  Laterality: N/A;  . ESOPHAGOGASTRODUODENOSCOPY N/A 03/04/2014   Procedure: ESOPHAGOGASTRODUODENOSCOPY (EGD);  Surgeon: Arta Silence, MD;  Location: [facility] ENDOSCOPY;  Service: Endoscopy;  Laterality: N/A;  . LAMINECTOMY    . LEFT HEART CATHETERIZATION WITH CORONARY ANGIOGRAM N/A 01/13/2014   Procedure: LEFT HEART CATHETERIZATION WITH CORONARY ANGIOGRAM;  Surgeon: Blane Ohara, MD;  Location: [facility] CATH LAB;  Service: Cardiovascular;  Laterality: N/A;  . MELANOMA EXCISION  late 1990's   "lower back"  . POSTERIOR LAMINECTOMY / DECOMPRESSION CERVICAL SPINE    . SAVORY DILATION  03/23/2012   Procedure: SAVORY DILATION;  Surgeon: Winfield Cunas., MD;  Location: [facility] ENDOSCOPY;  Service: Endoscopy;  Laterality: N/A;  . TONSILLECTOMY  1930's    Current Outpatient Rx  . Order #: [id number] Class: OTC  . Order #: [id number] Class: No Print  . Order #: [id number] Class: Historical Med  . Order #: [id number] Class: Normal  . Order #: [id number] Class: Normal  . Order #: [id number] Class: Historical Med  . Order #: [id number] Class: Normal  . Order #: [id number] Class: Normal  . Order #: [id number] Class: Normal  . Order #: [id number] Class: Normal  . Order #: [id number] Class: Historical Med  . Order #: [id number] Class: Print    Allergies Budesonide-formoterol fumarate; Iohexol; Simvastatin; Demerol [meperidine]; Ivp dye [iodinated diagnostic agents]; Meperidine hcl; Tape; Naproxen; Pregabalin; and Sulfonamide derivatives  Family History  Problem Relation Age of Onset  . Diabetes Father   . Heart disease Father   . Asthma Father   . Heart disease Mother        CABG hx  age 75  . Colon cancer Son        hx   . Colitis Son        hx  . Crohn's disease Son   . Prostate cancer Paternal Grandfather     Social History Social History  Substance Use Topics  . Smoking status: Never Smoker  . Smokeless tobacco: Never Used  . Alcohol use No    Review of Systems Constitutional: No fever/chills.no lightheadedness or dizziness.positive fall. No loss of consciousness. Eyes: positive "yellowing" of vision, now resolved. ENT: No sore throat. No congestion or rhinorrhea. HEAD: trauma to the face. Cardiovascular: Denies chest pain. Denies palpitations.positive pain in the right chest wall. Respiratory: Denies shortness of breath.  No cough. Gastrointestinal: No abdominal pain.  No nausea, no vomiting.  No diarrhea.  No constipation. Genitourinary: Negative for dysuria. Musculoskeletal: positivefor back pain. Skin: Negative for rash. Neurological: Negative for headaches. No focal numbness, tingling or weakness. Inability to stand up after fall.    ____________________________________________   PHYSICAL EXAM:  VITAL SIGNS: ED Triage Vitals  Enc Vitals Group     BP 07/31/17 1610 136/64     Pulse Rate 07/31/17 1607 64     Resp 07/31/17 1607 16     Temp 07/31/17 1607 97.7 F (36.5 C)     Temp Source 07/31/17 1607 Oral     SpO2 07/31/17 1607 98 %     Weight --      Height --      Head Circumference --      Peak Flow --      Pain Score 07/31/17 1606 6     Pain Loc --      Pain Edu? --      Excl. in [place]? --     Constitutional: Alert and oriented. Chronically ill appearing but in no acute distress. Answers questions appropriately.GCS is 15. Eyes: Conjunctivae are normal.  EOMI. Pupils are 2 mm, symmetric bilaterally, with minimal reaction. No raccoon eyes.No scleral icterus. Head: the patient has ecchymosis with a 0.5 x 0.5 cm abrasion over the right zygomatic arch. Nose: No congestion/rhinnorhea.no swelling over the nose. No septal  hematoma. Mouth/Throat: Mucous membranes are very dry. Poor dentition. No evidence of malocclusion or dental injury. No intraoral injury. Neck: No stridor.  Supple.  No JVD. No meningismus. No midline C-spine tenderness to palpation, step-offs or deformities. Cardiovascular: Normal rate, regular rhythm. No murmurs, rubs or gallops. The patient has tenderness to palpation over the anterior right chest above the nipple. He also has a small area of ecchymosis less than 1 x 1 cm on the right lateral chest wall without unstable palpable ribs or crepitus. Respiratory: Normal respiratory effort.  No accessory muscle use or retractions. Lungs CTAB.  No wheezes, rales or ronchi. Gastrointestinal: Soft, nontender and nondistended.  No guarding or rebound.  No peritoneal signs. Musculoskeletal: pelvis is stable. The patient reports pain with range of motion of the left hip. He also has pain with range of motion of the right wrist. Full range of motion of the bilateral shoulders, elbows, left wrist, right hip, knees, and ankles without pain. Normal DP and PT pulses bilaterally. Normal radial pulses bilaterally.  No LE edema. No ttp in the calves or palpable cords.  Negative Homan's sign. Neurologic:  A&Ox3.  Speech is clear.  Face and smile are symmetric.  EOMI.  Moves all extremities well. Skin:  Skin is warm, dry. No rash noted.the patient has a 3 x 2 cm skin tear over the right elbow. There is a 1 x 0.5 cm skin tear over the medial aspect of the left middle finger. Abrasion on the right zygomatic arch. 1 inch abrasion that is linear on the left upper scalp.  No lacerations which would require suturing. Psychiatric: Mood and affect are normal. Speech and behavior are normal.  Normal judgement.  ____________________________________________   LABS (all labs ordered are listed, but only abnormal results are displayed)  Labs Reviewed  URINALYSIS, COMPLETE (UACMP) WITH MICROSCOPIC - Abnormal; Notable for the  following:       Result Value   Color, Urine YELLOW (*)    APPearance CLEAR (*)    Hgb urine dipstick SMALL (*)    All other components within normal limits  CBC - Abnormal; Notable for the following:    RBC 4.11 (*)    Hemoglobin 12.4 (*)    HCT 36.2 (*)    RDW 14.8 (*)    All other components within normal limits  COMPREHENSIVE METABOLIC PANEL - Abnormal; Notable for the following:  Potassium 2.9 (*)    Chloride 91 (*)    CO2 33 (*)    Glucose, Bld 146 (*)    BUN 27 (*)    Creatinine, Ser 2.30 (*)    GFR calc non Af Amer 24 (*)    GFR calc Af Amer 28 (*)    All other components within normal limits  TROPONIN I - Abnormal; Notable for the following:    Troponin I 0.03 (*)    All other components within normal limits  TROPONIN I - Abnormal; Notable for the following:    Troponin I 0.03 (*)    All other components within normal limits  TROPONIN I  CBG MONITORING, ED   ____________________________________________  EKG  ED ECG REPORT I, Eula Listen, the attending physician, personally viewed and interpreted this ECG.   Date: 07/31/2017  EKG Time: 1607  Rate: 65  Rhythm: normal sinus rhythm  Axis: normal  Intervals:none  ST&T Change: No STEMI. + PAC  ____________________________________________  RADIOLOGY  Dg Chest 1 View  Result Date: 07/31/2017 CLINICAL DATA:  Fall EXAM: CHEST 1 VIEW COMPARISON:  06/18/2017 FINDINGS: Surgical hardware in the lower cervical spine. Metallic opacity in the left upper quadrant. No acute consolidation or effusion. Normal heart size. Aortic atherosclerosis. No pneumothorax. IMPRESSION: 1. No radiographic evidence for acute cardiopulmonary abnormality. 2. Round metallic opacity in the left upper quadrant for which clinical correlation is recommended. Electronically Signed   By: Donavan Foil M.D.   On: 07/31/2017 17:45   Dg Thoracic Spine 2 View  Result Date: 07/31/2017 CLINICAL DATA:  Fall EXAM: THORACIC SPINE 2 VIEWS  COMPARISON:  06/18/2017 FINDINGS: Partially visualized surgical hardware in the cervical spine. Thoracic alignment is within normal limits. Moderate degenerative changes of the thoracic spine. Vertebral body heights appear maintained. IMPRESSION: Degenerative changes.  No definite acute osseous abnormality. Electronically Signed   By: Donavan Foil M.D.   On: 07/31/2017 17:41   Dg Lumbar Spine Complete  Result Date: 07/31/2017 CLINICAL DATA:  Fall with pain EXAM: LUMBAR SPINE - COMPLETE 4+ VIEW COMPARISON:  08/02/2012 FINDINGS: Trace retrolisthesis of L2 on L3 unchanged. Status post posterior stabilization rod and fixating screws from L2 through L4 with interbody device at L3-L4. Moderate degenerative changes at L1-L2 with mild degenerative changes at L4-L5. 12 mm stone projecting over the lower pole of the left kidney. Aortic atherosclerosis IMPRESSION: 1. Postsurgical changes from L2 through L4. No definite acute osseous abnormality. 2. 12 mm stone projecting over lower pole of left kidney Electronically Signed   By: Donavan Foil M.D.   On: 07/31/2017 17:39   Dg Wrist Complete Right  Result Date: 07/31/2017 CLINICAL DATA:  Fall with wrist pain EXAM: RIGHT WRIST - COMPLETE 3+ VIEW COMPARISON:  None. FINDINGS: No acute displaced fracture or malalignment. Advanced arthritis at the first [facility] joint. Cartilage calcifications. Vascular calcifications. IMPRESSION: 1. No acute osseous abnormality 2. Chondrocalcinosis 3. Prominent degenerative changes at the first [facility] joint. Electronically Signed   By: Donavan Foil M.D.   On: 07/31/2017 17:43   Ct Head Wo Contrast  Result Date: 07/31/2017 CLINICAL DATA:  83 year old male post mechanical fall. Denies loss of consciousness. Initial encounter. EXAM: CT HEAD WITHOUT CONTRAST CT MAXILLOFACIAL WITHOUT CONTRAST CT CERVICAL SPINE WITHOUT CONTRAST TECHNIQUE: Multidetector CT imaging of the head, cervical spine, and maxillofacial structures were performed using the  standard protocol without intravenous contrast. Multiplanar CT image reconstructions of the cervical spine and maxillofacial structures were also generated. COMPARISON:  06/19/2017 brain  MR. 06/18/2017 head CT. 11/05/2012 cervical spine CT. FINDINGS: CT HEAD FINDINGS Brain: No intracranial hemorrhage or CT evidence of large acute infarct. Chronic microvascular changes. Global atrophy. No intracranial mass lesion noted on this unenhanced exam. Vascular: Vascular calcification Skull: No skull fracture Other: Negative CT MAXILLOFACIAL FINDINGS Osseous: No fracture.  Prominent dental caries. Orbits: Intact. Sinuses: Minimal mucosal thickening maxillary sinuses. Prior sinus surgery. Soft tissues: No acute abnormality. CT CERVICAL SPINE FINDINGS Alignment: Within normal limits. Skull base and vertebrae: No cervical spine fracture. Soft tissues and spinal canal: No abnormal prevertebral soft tissue swelling. Disc levels: Fusion C3-C7. Congenitally narrowed spinal canal. Superimposed mild spurs. Transverse ligament hypertrophy with spinal stenosis and cord flattening. Upper chest: No worrisome abnormality. Other: Carotid bifurcation calcifications. IMPRESSION: CT HEAD No skull fracture or intracranial hemorrhage. Chronic microvascular changes. Global atrophy. CT MAXILLOFACIAL No fracture.  Prominent dental caries. CT CERVICAL SPINE No cervical spine fracture, malalignment or abnormal prevertebral soft tissue swelling. Fusion C3-C7. Congenitally narrowed spinal canal. Superimposed mild spurs. Transverse ligament hypertrophy with spinal stenosis and cord flattening. Electronically Signed   By: Genia Del M.D.   On: 07/31/2017 17:26   Ct Cervical Spine Wo Contrast  Result Date: 07/31/2017 CLINICAL DATA:  83 year old male post mechanical fall. Denies loss of consciousness. Initial encounter. EXAM: CT HEAD WITHOUT CONTRAST CT MAXILLOFACIAL WITHOUT CONTRAST CT CERVICAL SPINE WITHOUT CONTRAST TECHNIQUE: Multidetector CT  imaging of the head, cervical spine, and maxillofacial structures were performed using the standard protocol without intravenous contrast. Multiplanar CT image reconstructions of the cervical spine and maxillofacial structures were also generated. COMPARISON:  06/19/2017 brain MR. 06/18/2017 head CT. 11/05/2012 cervical spine CT. FINDINGS: CT HEAD FINDINGS Brain: No intracranial hemorrhage or CT evidence of large acute infarct. Chronic microvascular changes. Global atrophy. No intracranial mass lesion noted on this unenhanced exam. Vascular: Vascular calcification Skull: No skull fracture Other: Negative CT MAXILLOFACIAL FINDINGS Osseous: No fracture.  Prominent dental caries. Orbits: Intact. Sinuses: Minimal mucosal thickening maxillary sinuses. Prior sinus surgery. Soft tissues: No acute abnormality. CT CERVICAL SPINE FINDINGS Alignment: Within normal limits. Skull base and vertebrae: No cervical spine fracture. Soft tissues and spinal canal: No abnormal prevertebral soft tissue swelling. Disc levels: Fusion C3-C7. Congenitally narrowed spinal canal. Superimposed mild spurs. Transverse ligament hypertrophy with spinal stenosis and cord flattening. Upper chest: No worrisome abnormality. Other: Carotid bifurcation calcifications. IMPRESSION: CT HEAD No skull fracture or intracranial hemorrhage. Chronic microvascular changes. Global atrophy. CT MAXILLOFACIAL No fracture.  Prominent dental caries. CT CERVICAL SPINE No cervical spine fracture, malalignment or abnormal prevertebral soft tissue swelling. Fusion C3-C7. Congenitally narrowed spinal canal. Superimposed mild spurs. Transverse ligament hypertrophy with spinal stenosis and cord flattening. Electronically Signed   By: Genia Del M.D.   On: 07/31/2017 17:26   Dg Hip Unilat W Or Wo Pelvis 2-3 Views Left  Result Date: 07/31/2017 CLINICAL DATA:  Fall EXAM: DG HIP (WITH OR WITHOUT PELVIS) 2-3V LEFT COMPARISON:  08/02/2012 FINDINGS: SI joints are symmetric.  Pubic symphysis and rami are intact. Moderate arthritis of the right hip. No acute displaced fracture or malalignment. Mild to moderate arthritis of the left hip. Multiple calcified phleboliths in the pelvis. IMPRESSION: 1. No acute osseous abnormality 2. Bilateral degenerative changes of the right greater than left hips. Electronically Signed   By: Donavan Foil M.D.   On: 07/31/2017 17:35   Ct Maxillofacial Wo Contrast  Result Date: 07/31/2017 CLINICAL DATA:  83 year old male post mechanical fall. Denies loss of consciousness. Initial encounter. EXAM: CT HEAD WITHOUT  CONTRAST CT MAXILLOFACIAL WITHOUT CONTRAST CT CERVICAL SPINE WITHOUT CONTRAST TECHNIQUE: Multidetector CT imaging of the head, cervical spine, and maxillofacial structures were performed using the standard protocol without intravenous contrast. Multiplanar CT image reconstructions of the cervical spine and maxillofacial structures were also generated. COMPARISON:  06/19/2017 brain MR. 06/18/2017 head CT. 11/05/2012 cervical spine CT. FINDINGS: CT HEAD FINDINGS Brain: No intracranial hemorrhage or CT evidence of large acute infarct. Chronic microvascular changes. Global atrophy. No intracranial mass lesion noted on this unenhanced exam. Vascular: Vascular calcification Skull: No skull fracture Other: Negative CT MAXILLOFACIAL FINDINGS Osseous: No fracture.  Prominent dental caries. Orbits: Intact. Sinuses: Minimal mucosal thickening maxillary sinuses. Prior sinus surgery. Soft tissues: No acute abnormality. CT CERVICAL SPINE FINDINGS Alignment: Within normal limits. Skull base and vertebrae: No cervical spine fracture. Soft tissues and spinal canal: No abnormal prevertebral soft tissue swelling. Disc levels: Fusion C3-C7. Congenitally narrowed spinal canal. Superimposed mild spurs. Transverse ligament hypertrophy with spinal stenosis and cord flattening. Upper chest: No worrisome abnormality. Other: Carotid bifurcation calcifications. IMPRESSION:  CT HEAD No skull fracture or intracranial hemorrhage. Chronic microvascular changes. Global atrophy. CT MAXILLOFACIAL No fracture.  Prominent dental caries. CT CERVICAL SPINE No cervical spine fracture, malalignment or abnormal prevertebral soft tissue swelling. Fusion C3-C7. Congenitally narrowed spinal canal. Superimposed mild spurs. Transverse ligament hypertrophy with spinal stenosis and cord flattening. Electronically Signed   By: Genia Del M.D.   On: 07/31/2017 17:26    ____________________________________________   PROCEDURES  Procedure(s) performed: None  Procedures  Critical Care performed: No ____________________________________________   INITIAL IMPRESSION / ASSESSMENT AND PLAN / ED COURSE  Pertinent labs & imaging results that were available during my care of the patient were reviewed by me and considered in my medical decision making (see chart for details).  83 y.o. Male who takes Plavix presenting with yellowing of vision followed by a fall resulting in facial injury, thoracic and lumbar spine tenderness, right wrist pain, left hip pain, and multiple skin tears.  This patient will need a workup both for his trauma as well as the cause of his visual changes. For trauma, we'll get a CT of the head, face and neck, as well as chest x-ray, left hip x-ray, right wrist x-ray. Considering his changes in vision, his family states that he has had poor intake of food recently but has been drinking well. I'm concerned about dehydration given his dry mucous membranes. Will get orthostatics, and basic laboratory studies and give the patient intravenous fluids. The patient has an EKG without ischemic changes but we'll also check a troponin, electrolytes, and reevaluate the patient for final disposition. The patient will receive a tetanus booster in the emergency department as he and his family are unsure when the last one he had was.  ----------------------------------------- 5:59 PM on  07/31/2017 -----------------------------------------  At this time, the patient's trauma workup was reassuring. He has no acute intracranial injuries and a CT head, no facial fractures, and no abnormalities in this CT of the cervical spine. His chest x-ray, right wrist x-ray,left hip x-ray, thoracic and lumbar spine x-rays, do not show any evidence of acute trauma.  On examination, the patient had a 40 point systolic blood pressure drop from lying to standing, which may explain why he had no changes in vision with standing and sustained a fall. It appears that he may be dehydrated from not taking enough by mouth, with orthostasis. I have ordered 2 L of intravenous fluids and we will recheck his blood pressures  as well as his clinical symptoms. In the meantime we'll get a second troponin. Plan reevaluation for final disposition.  ----------------------------------------- 7:25 PM on 07/31/2017 -----------------------------------------  The patient has no evidence of acute trauma from his fall. However, he does have signs and symptoms concerning for significant dehydration requiring admission for his dehydration. He has renal insufficiency, as well as orthostasis. In addition, he is hyponatremic with a potassium of 2.9. At this time, the patient will be admitted for further evaluation and treatment.  ____________________________________________  FINAL CLINICAL IMPRESSION(S) / ED DIAGNOSES  Final diagnoses:  Orthostatic hypotension  Fall, initial encounter  Facial contusion, initial encounter  Facial abrasion, initial encounter  Multiple skin tears  Acute renal insufficiency  Dehydration  Hypokalemia  Elevated troponin         NEW MEDICATIONS STARTED DURING THIS VISIT:  New Prescriptions   No medications on file      Eula Listen, MD 07/31/17 1925

## 2017-07-31 NOTE — H&P (Signed)
[name] is an 83 y.o. male.   Chief Complaint: fall HPI: the patient with past medical history of CAD arthritis and hypertension presents to the emergency department after suffering a fall. The patient states that his vision became blurry and "golden colored" before he fell but he denies losing consciousness. He did have trouble getting up after falling but is able to bear weight. In the emergency department the patient was found to be hypokalemic. Skeletal survey was negative for acute fractures. The patient also had a 40 point drop in blood pressure with orthostatic evaluation which prompted the emergency department staff to call the hospitalist service for admission.  Past Medical History:  Diagnosis Date  . Arthritis    "hips, back" (06/17/2015)  . Asthma   . Chronic chest pain   . Chronic lower back pain   . Coronary artery disease    a. s/p BMS to RCA in 2002; b. s/p cutting balloon POBA ;   c. cath 6/12: oDx 80% (treated with repeat cutting balloon POBA), mLAD 50% with 30-40% at Dx, CFX 30%, pRCA 25% with patent stents;  d.  Lex MV 4/14:  Low Risk - EF 61%, inf scar with peri-infarct ischemia  . Dyspnea    chronic  . GERD (gastroesophageal reflux disease)    h/o esophageal spasm  . Headache   . History of blood transfusion    "related to OR"  . History of hiatal hernia   . Hyperlipidemia   . Hypertension   . Melanoma of lower back ([place]) late 1990's  . Memory loss   . Myocardial infarction ([place]) 2001   x 1, confirned 1 possible    Past Surgical History:  Procedure Laterality Date  . ANTERIOR CERVICAL DECOMP/DISCECTOMY FUSION    . BACK SURGERY  multiple  . CARDIAC CATHETERIZATION     "I've had 17 caths" (06/17/2015)  . CATARACT EXTRACTION W/ INTRAOCULAR LENS  IMPLANT, BILATERAL Bilateral   . CERVICAL DISC SURGERY  multiple  . COLONOSCOPY WITH PROPOFOL N/A 04/17/2014   Procedure: COLONOSCOPY WITH PROPOFOL;  Surgeon: Winfield Cunas., MD;  Location: [facility] END[facility]COPY;   Service: Endoscopy;  Laterality: N/A;  . CORONARY ANGIOPLASTY WITH STENT PLACEMENT  x 2 stents    previous percutaneous intervention on the  RCA and the diagonal branch  . ESOPHAGOGASTRODUODENOSCOPY  03/23/2012   Procedure: ESOPHAGOGASTRODUODENOSCOPY (EGD);  Surgeon: Winfield Cunas., MD;  Location: [facility];  Service: Endoscopy;  Laterality: N/A;  . ESOPHAGOGASTRODUODENOSCOPY N/A 03/04/2014   Procedure: ESOPHAGOGASTRODUODENOSCOPY (EGD);  Surgeon: Arta Silence, MD;  Location: [facility] END[facility]COPY;  Service: Endoscopy;  Laterality: N/A;  . LAMINECTOMY    . LEFT HEART CATHETERIZATION WITH CORONARY ANGIOGRAM N/A 01/13/2014   Procedure: LEFT HEART CATHETERIZATION WITH CORONARY ANGIOGRAM;  Surgeon: Blane Ohara, MD;  Location: [facility] CATH LAB;  Service: Cardiovascular;  Laterality: N/A;  . MELANOMA EXCISION  late 1990's   "lower back"  . POSTERIOR LAMINECTOMY / DECOMPRESSION CERVICAL SPINE    . SAVORY DILATION  03/23/2012   Procedure: SAVORY DILATION;  Surgeon: Winfield Cunas., MD;  Location: [facility];  Service: Endoscopy;  Laterality: N/A;  . TONSILLECTOMY  1930's    Family History  Problem Relation Age of Onset  . Diabetes Father   . Heart disease Father   . Asthma Father   . Heart disease Mother        CABG hx age 47  . Colon cancer Son  hx   . Colitis Son        hx  . Crohn's disease Son   . Prostate cancer Paternal Grandfather    Social History:  reports that he has never smoked. He has never used smokeless tobacco. He reports that he does not drink alcohol or use drugs.  Allergies:  Allergies  Allergen Reactions  . Budesonide-Formoterol Fumarate Swelling    Mouth and tongue swelling.  . Iohexol Shortness Of Breath and Itching    Pt was given 100cc of Omnipaque 300 followed by itching/ dyspnea.  . Simvastatin Other (See Comments)    REACTION: unknown  . Demerol [Meperidine] Other (See Comments)    REACTION: Hallucinations  . Ivp Dye [Iodinated Diagnostic Agents]  Other (See Comments)    Iodine pt had a reaction when he had a CT DONE  . Meperidine Hcl Other (See Comments)    REACTION: Hallucinations  . Tape Other (See Comments)    Tears skin off, Please use "paper" tape  . Naproxen Swelling  . Pregabalin Rash  . Sulfonamide Derivatives Rash     (Not in a hospital admission)  Results for orders placed or performed during the hospital encounter of 07/31/17 (from the past 48 hour(s))  Urinalysis, Complete w Microscopic     Status: Abnormal   Collection Time: 07/31/17  5:19 PM  Result Value Ref Range   Color, Urine YELLOW (A) YELLOW   APPearance CLEAR (A) CLEAR   Specific Gravity, Urine 1.008 1.005 - 1.030   pH 5.0 5.0 - 8.0   Glucose, UA NEGATIVE NEGATIVE mg/dL   Hgb urine dipstick SMALL (A) NEGATIVE   Bilirubin Urine NEGATIVE NEGATIVE   Ketones, ur NEGATIVE NEGATIVE mg/dL   Protein, ur NEGATIVE NEGATIVE mg/dL   Nitrite NEGATIVE NEGATIVE   Leukocytes, UA NEGATIVE NEGATIVE   RBC / HPF 0-5 0 - 5 RBC/hpf   WBC, UA 0-5 0 - 5 WBC/hpf   Bacteria, UA NONE SEEN NONE SEEN   Squamous Epithelial / LPF NONE SEEN NONE SEEN   Mucus PRESENT    Hyaline Casts, UA PRESENT   CBC     Status: Abnormal   Collection Time: 07/31/17  5:19 PM  Result Value Ref Range   WBC 6.8 3.8 - 10.6 K/uL   RBC 4.11 (L) 4.40 - 5.90 MIL/uL   Hemoglobin 12.4 (L) 13.0 - 18.0 g/dL   HCT 36.2 (L) 40.0 - 52.0 %   MCV 88.1 80.0 - 100.0 fL   MCH 30.1 26.0 - 34.0 pg   MCHC 34.2 32.0 - 36.0 g/dL   RDW 14.8 (H) 11.5 - 14.5 %   Platelets 236 150 - 440 K/uL  Comprehensive metabolic panel     Status: Abnormal   Collection Time: 07/31/17  5:19 PM  Result Value Ref Range   Sodium 135 135 - 145 mmol/L   Potassium 2.9 (L) 3.5 - 5.1 mmol/L   Chloride 91 (L) 101 - 111 mmol/L   CO2 33 (H) 22 - 32 mmol/L   Glucose, Bld 146 (H) 65 - 99 mg/dL   BUN 27 (H) 6 - 20 mg/dL   Creatinine, Ser 2.30 (H) 0.61 - 1.24 mg/dL   Calcium 9.2 8.9 - 10.3 mg/dL   Total Protein 6.9 6.5 - 8.1 g/dL    Albumin 3.7 3.5 - 5.0 g/dL   AST 30 15 - 41 U/L   ALT 21 17 - 63 U/L   Alkaline Phosphatase 87 38 - 126 U/L   Total Bilirubin 1.0  0.3 - 1.2 mg/dL   GFR calc non Af Amer 24 (L) >60 mL/min   GFR calc Af Amer 28 (L) >60 mL/min    Comment: (NOTE) The eGFR has been calculated using the CKD EPI equation. This calculation has not been validated in all clinical situations. eGFR's persistently <60 mL/min signify possible Chronic Kidney Disease.    Anion gap 11 5 - 15  Troponin I     Status: Abnormal   Collection Time: 07/31/17  5:19 PM  Result Value Ref Range   Troponin I 0.03 (HH) <0.03 ng/mL    Comment: CRITICAL RESULT CALLED TO, READ BACK BY AND VERIFIED WITH Martinique LOYE RN AT 7001 07/31/17. MSS   Troponin I     Status: Abnormal   Collection Time: 07/31/17  6:11 PM  Result Value Ref Range   Troponin I 0.03 (HH) <0.03 ng/mL    Comment: CRITICAL RESULT CALLED TO, READ BACK BY AND VERIFIED WITH KAILEY WALKER ON 07/31/17 AT 1916 [employer] [place]    Dg Chest 1 View  Result Date: 07/31/2017 CLINICAL DATA:  Fall EXAM: CHEST 1 VIEW COMPARISON:  06/18/2017 FINDINGS: Surgical hardware in the lower cervical spine. Metallic opacity in the left upper quadrant. No acute consolidation or effusion. Normal heart size. Aortic atherosclerosis. No pneumothorax. IMPRESSION: 1. No radiographic evidence for acute cardiopulmonary abnormality. 2. Round metallic opacity in the left upper quadrant for which clinical correlation is recommended. Electronically Signed   By: Donavan Foil M.D.   On: 07/31/2017 17:45   Dg Thoracic Spine 2 View  Result Date: 07/31/2017 CLINICAL DATA:  Fall EXAM: THORACIC SPINE 2 VIEWS COMPARISON:  06/18/2017 FINDINGS: Partially visualized surgical hardware in the cervical spine. Thoracic alignment is within normal limits. Moderate degenerative changes of the thoracic spine. Vertebral body heights appear maintained. IMPRESSION: Degenerative changes.  No definite acute osseous abnormality.  Electronically Signed   By: Donavan Foil M.D.   On: 07/31/2017 17:41   Dg Lumbar Spine Complete  Result Date: 07/31/2017 CLINICAL DATA:  Fall with pain EXAM: LUMBAR SPINE - COMPLETE 4+ VIEW COMPARISON:  08/02/2012 FINDINGS: Trace retrolisthesis of L2 on L3 unchanged. Status post posterior stabilization rod and fixating screws from L2 through L4 with interbody device at L3-L4. Moderate degenerative changes at L1-L2 with mild degenerative changes at L4-L5. 12 mm stone projecting over the lower pole of the left kidney. Aortic atherosclerosis IMPRESSION: 1. Postsurgical changes from L2 through L4. No definite acute osseous abnormality. 2. 12 mm stone projecting over lower pole of left kidney Electronically Signed   By: Donavan Foil M.D.   On: 07/31/2017 17:39   Dg Wrist Complete Right  Result Date: 07/31/2017 CLINICAL DATA:  Fall with wrist pain EXAM: RIGHT WRIST - COMPLETE 3+ VIEW COMPARISON:  None. FINDINGS: No acute displaced fracture or malalignment. Advanced arthritis at the first [facility] joint. Cartilage calcifications. Vascular calcifications. IMPRESSION: 1. No acute osseous abnormality 2. Chondrocalcinosis 3. Prominent degenerative changes at the first [facility] joint. Electronically Signed   By: Donavan Foil M.D.   On: 07/31/2017 17:43   Ct Head Wo Contrast  Result Date: 07/31/2017 CLINICAL DATA:  83 year old male post mechanical fall. Denies loss of consciousness. Initial encounter. EXAM: CT HEAD WITHOUT CONTRAST CT MAXILLOFACIAL WITHOUT CONTRAST CT CERVICAL SPINE WITHOUT CONTRAST TECHNIQUE: Multidetector CT imaging of the head, cervical spine, and maxillofacial structures were performed using the standard protocol without intravenous contrast. Multiplanar CT image reconstructions of the cervical spine and maxillofacial structures were also generated. COMPARISON:  06/19/2017 brain MR.  06/18/2017 head CT. 11/05/2012 cervical spine CT. FINDINGS: CT HEAD FINDINGS Brain: No intracranial hemorrhage or CT  evidence of large acute infarct. Chronic microvascular changes. Global atrophy. No intracranial mass lesion noted on this unenhanced exam. Vascular: Vascular calcification Skull: No skull fracture Other: Negative CT MAXILLOFACIAL FINDINGS Osseous: No fracture.  Prominent dental caries. Orbits: Intact. Sinuses: Minimal mucosal thickening maxillary sinuses. Prior sinus surgery. Soft tissues: No acute abnormality. CT CERVICAL SPINE FINDINGS Alignment: Within normal limits. Skull base and vertebrae: No cervical spine fracture. Soft tissues and spinal canal: No abnormal prevertebral soft tissue swelling. Disc levels: Fusion C3-C7. Congenitally narrowed spinal canal. Superimposed mild spurs. Transverse ligament hypertrophy with spinal stenosis and cord flattening. Upper chest: No worrisome abnormality. Other: Carotid bifurcation calcifications. IMPRESSION: CT HEAD No skull fracture or intracranial hemorrhage. Chronic microvascular changes. Global atrophy. CT MAXILLOFACIAL No fracture.  Prominent dental caries. CT CERVICAL SPINE No cervical spine fracture, malalignment or abnormal prevertebral soft tissue swelling. Fusion C3-C7. Congenitally narrowed spinal canal. Superimposed mild spurs. Transverse ligament hypertrophy with spinal stenosis and cord flattening. Electronically Signed   By: Genia Del M.D.   On: 07/31/2017 17:26   Ct Cervical Spine Wo Contrast  Result Date: 07/31/2017 CLINICAL DATA:  83 year old male post mechanical fall. Denies loss of consciousness. Initial encounter. EXAM: CT HEAD WITHOUT CONTRAST CT MAXILLOFACIAL WITHOUT CONTRAST CT CERVICAL SPINE WITHOUT CONTRAST TECHNIQUE: Multidetector CT imaging of the head, cervical spine, and maxillofacial structures were performed using the standard protocol without intravenous contrast. Multiplanar CT image reconstructions of the cervical spine and maxillofacial structures were also generated. COMPARISON:  06/19/2017 brain MR. 06/18/2017 head CT.  11/05/2012 cervical spine CT. FINDINGS: CT HEAD FINDINGS Brain: No intracranial hemorrhage or CT evidence of large acute infarct. Chronic microvascular changes. Global atrophy. No intracranial mass lesion noted on this unenhanced exam. Vascular: Vascular calcification Skull: No skull fracture Other: Negative CT MAXILLOFACIAL FINDINGS Osseous: No fracture.  Prominent dental caries. Orbits: Intact. Sinuses: Minimal mucosal thickening maxillary sinuses. Prior sinus surgery. Soft tissues: No acute abnormality. CT CERVICAL SPINE FINDINGS Alignment: Within normal limits. Skull base and vertebrae: No cervical spine fracture. Soft tissues and spinal canal: No abnormal prevertebral soft tissue swelling. Disc levels: Fusion C3-C7. Congenitally narrowed spinal canal. Superimposed mild spurs. Transverse ligament hypertrophy with spinal stenosis and cord flattening. Upper chest: No worrisome abnormality. Other: Carotid bifurcation calcifications. IMPRESSION: CT HEAD No skull fracture or intracranial hemorrhage. Chronic microvascular changes. Global atrophy. CT MAXILLOFACIAL No fracture.  Prominent dental caries. CT CERVICAL SPINE No cervical spine fracture, malalignment or abnormal prevertebral soft tissue swelling. Fusion C3-C7. Congenitally narrowed spinal canal. Superimposed mild spurs. Transverse ligament hypertrophy with spinal stenosis and cord flattening. Electronically Signed   By: Genia Del M.D.   On: 07/31/2017 17:26   Dg Hip Unilat W Or Wo Pelvis 2-3 Views Left  Result Date: 07/31/2017 CLINICAL DATA:  Fall EXAM: DG HIP (WITH OR WITHOUT PELVIS) 2-3V LEFT COMPARISON:  08/02/2012 FINDINGS: SI joints are symmetric. Pubic symphysis and rami are intact. Moderate arthritis of the right hip. No acute displaced fracture or malalignment. Mild to moderate arthritis of the left hip. Multiple calcified phleboliths in the pelvis. IMPRESSION: 1. No acute osseous abnormality 2. Bilateral degenerative changes of the right  greater than left hips. Electronically Signed   By: Donavan Foil M.D.   On: 07/31/2017 17:35   Ct Maxillofacial Wo Contrast  Result Date: 07/31/2017 CLINICAL DATA:  83 year old male post mechanical fall. Denies loss of consciousness. Initial encounter. EXAM: CT HEAD WITHOUT  CONTRAST CT MAXILLOFACIAL WITHOUT CONTRAST CT CERVICAL SPINE WITHOUT CONTRAST TECHNIQUE: Multidetector CT imaging of the head, cervical spine, and maxillofacial structures were performed using the standard protocol without intravenous contrast. Multiplanar CT image reconstructions of the cervical spine and maxillofacial structures were also generated. COMPARISON:  06/19/2017 brain MR. 06/18/2017 head CT. 11/05/2012 cervical spine CT. FINDINGS: CT HEAD FINDINGS Brain: No intracranial hemorrhage or CT evidence of large acute infarct. Chronic microvascular changes. Global atrophy. No intracranial mass lesion noted on this unenhanced exam. Vascular: Vascular calcification Skull: No skull fracture Other: Negative CT MAXILLOFACIAL FINDINGS Osseous: No fracture.  Prominent dental caries. Orbits: Intact. Sinuses: Minimal mucosal thickening maxillary sinuses. Prior sinus surgery. Soft tissues: No acute abnormality. CT CERVICAL SPINE FINDINGS Alignment: Within normal limits. Skull base and vertebrae: No cervical spine fracture. Soft tissues and spinal canal: No abnormal prevertebral soft tissue swelling. Disc levels: Fusion C3-C7. Congenitally narrowed spinal canal. Superimposed mild spurs. Transverse ligament hypertrophy with spinal stenosis and cord flattening. Upper chest: No worrisome abnormality. Other: Carotid bifurcation calcifications. IMPRESSION: CT HEAD No skull fracture or intracranial hemorrhage. Chronic microvascular changes. Global atrophy. CT MAXILLOFACIAL No fracture.  Prominent dental caries. CT CERVICAL SPINE No cervical spine fracture, malalignment or abnormal prevertebral soft tissue swelling. Fusion C3-C7. Congenitally narrowed  spinal canal. Superimposed mild spurs. Transverse ligament hypertrophy with spinal stenosis and cord flattening. Electronically Signed   By: Genia Del M.D.   On: 07/31/2017 17:26    Review of Systems  Constitutional: Negative for chills and fever.  HENT: Negative for sore throat and tinnitus.   Eyes: Negative for blurred vision and redness.  Respiratory: Negative for cough and shortness of breath.   Cardiovascular: Negative for chest pain, palpitations, orthopnea and PND.  Gastrointestinal: Negative for abdominal pain, diarrhea, nausea and vomiting.  Genitourinary: Negative for dysuria, frequency and urgency.  Musculoskeletal: Positive for falls. Negative for joint pain and myalgias.  Skin: Negative for rash.       No lesions  Neurological: Negative for speech change, focal weakness and weakness.  Endo/Heme/Allergies: Does not bruise/bleed easily.       No temperature intolerance  Psychiatric/Behavioral: Negative for depression and suicidal ideas.    Blood pressure (!) 149/87, pulse (!) 50, temperature 97.7 F (36.5 C), temperature source Oral, resp. rate 14, SpO2 97 %. Physical Exam  Nursing note and vitals reviewed. Constitutional: He is oriented to person, place, and time. He appears well-developed and well-nourished. No distress.  HENT:  Head: Normocephalic and atraumatic.  Mouth/Throat: Oropharynx is clear and moist.  Eyes: Pupils are equal, round, and reactive to light. Conjunctivae and EOM are normal. No scleral icterus.  Neck: Normal range of motion. Neck supple. No JVD present. No tracheal deviation present. No thyromegaly present.  Cardiovascular: Normal rate, regular rhythm and normal heart sounds.  Exam reveals no gallop and no friction rub.   No murmur heard. Respiratory: Effort normal and breath sounds normal. No respiratory distress.  GI: Soft. Bowel sounds are normal. He exhibits no distension. There is no tenderness.  Genitourinary:  Genitourinary Comments:  Deferred  Musculoskeletal: Normal range of motion. He exhibits no edema.  Lymphadenopathy:    He has no cervical adenopathy.  Neurological: He is alert and oriented to person, place, and time. No cranial nerve deficit.  Skin: Skin is warm and dry. No rash noted. No erythema.  Psychiatric: He has a normal mood and affect. His behavior is normal. Judgment and thought content normal.     Assessment/Plan This is an 83 year old  male admitted for falls. 1. Falls: Presyncopal; no loss of consciousness. The patient has abrasions and bruises. History does not indicate arrhythmia or neurogenic syncope. Patient is likely dehydrated. 2. Orthostatic hypotension: Hydrate with intravenous fluid. PT/OT evaluation 3. Hypokalemia: Replete potassium. Contributes to weakness and falls. 4. CAD: Stable; continue aspirin and Plavix.I have decreased the patient's dose of Imdur which may have also been contributing to his hypotension. 5. BPH: Continue Flomax and Cardura (this may also be contributing to hypotension) 6. DVT prophylaxis:SCDs 7. GI prophylaxis: Protonix per home regimen The patient is a DO NOT RESUSCITATE. Time spent on admission orders and patient care approximately 45 minutes  Harrie Foreman, MD 07/31/2017, 8:37 PM

## 2017-08-01 DIAGNOSIS — E860 Dehydration: Secondary | ICD-10-CM | POA: Diagnosis not present

## 2017-08-01 LAB — TROPONIN I
Troponin I: 0.03 ng/mL (ref <0.03)
Troponin I: 0.03 ng/mL (ref <0.03)
Troponin I: <0.03 ng/mL (ref <0.03)

## 2017-08-01 LAB — BASIC METABOLIC PANEL
ANION GAP: 8 (ref 5–15)
BUN: 21 mg/dL — ABNORMAL HIGH (ref 6–20)
CO2: 28 mmol/L (ref 22–32)
Calcium: 8.9 mg/dL (ref 8.9–10.3)
Chloride: 104 mmol/L (ref 101–111)
Creatinine, Ser: 1.22 mg/dL (ref 0.61–1.24)
GFR calc non Af Amer: 53 mL/min — ABNORMAL LOW (ref >60)
GLUCOSE: 117 mg/dL — AB (ref 65–99)
POTASSIUM: 4.4 mmol/L (ref 3.5–5.1)
Sodium: 140 mmol/L (ref 135–145)

## 2017-08-01 MED ORDER — ENSURE ENLIVE PO LIQD
237.0000 mL | Freq: Two times a day (BID) | ORAL | Status: DC
  Administered 2017-08-01 – 2017-08-02 (×2): 237 mL via ORAL

## 2017-08-01 NOTE — Evaluation (Signed)
Physical Therapy Evaluation Patient Details Name: [name] MRN: [id number] DOB: Feb 14, 1931 Today's Date: 08/01/2017   History of Present Illness  Pt is an 83 yo M with past medical history of CAD arthritis and hypertension presents to the emergency department after suffering a fall. The patient states that his vision became blurry and "golden colored" before he fell but he denies losing consciousness. He did have trouble getting up after falling but is able to bear weight. In the emergency department the patient was found to be hypokalemic. Skeletal survey was negative for acute fractures. The patient also had a 40 point drop in blood pressure with orthostatic evaluation which prompted the emergency department staff to call the hospitalist service for admission.  Assessment includes: fall with no LOC, orthostatic hypotension, hypokalemia, CAD, and BPH.     Clinical Impression  Pt presents with mild deficits in strength, transfers, mobility, gait, balance, and activity tolerance.  Pt receiving supplemental Ka at time of PT eval with pt's vital signs and response to activity monitored closely during session.  Orthostatics taken during session with supine BP 145/70, sitting 136/76, and standing 133/82 mmHg all without c/o adverse symptoms.  Pt SBA with bed mobility tasks with extra time and effort required.  Pt SBA with transfers from various height surfaces with SBA and cues for proper hand placement for improved effort and safety.  Pt able to amb 60' with RW and CGA.  Slow cadence during gait with flexed trunk posture and short B step length but steady without LOB.  SpO2 99% with HR in the low 60s after amb with no c/o of adverse symptoms.  Pt will benefit from HHPT services upon discharge to safely address above deficits for decreased caregiver assistance and eventual return to PLOF.       Follow Up Recommendations Home health PT    Equipment Recommendations  None recommended by PT     Recommendations for Other Services       Precautions / Restrictions Precautions Precautions: Fall Restrictions Weight Bearing Restrictions: No      Mobility  Bed Mobility Overal bed mobility: Needs Assistance Bed Mobility: Supine to Sit;Sit to Supine     Supine to sit: Supervision Sit to supine: Supervision   General bed mobility comments: Extra time and effort required by no physical assistance  Transfers Overall transfer level: Needs assistance Equipment used: Rolling walker (2 wheeled) Transfers: Sit to/from Stand Sit to Stand: Supervision         General transfer comment: Pt steady with initial stand from EOB   Ambulation/Gait Ambulation/Gait assistance: Min guard Ambulation Distance (Feet): 60 Feet Assistive device: Rolling walker (2 wheeled) Gait Pattern/deviations: Step-through pattern;Decreased step length - right;Decreased step length - left;Trunk flexed   Gait velocity interpretation: Below normal speed for age/gender General Gait Details: Slow cadence with flexed trunk posture and short B step length but steady without LOB.  SpO2 99% with HR in the low 60s after amb with no c/o of adverse symptoms.    Stairs Stairs:  (Deferred)          Wheelchair Mobility    Modified Rankin (Stroke Patients Only)       Balance Overall balance assessment: Needs assistance Sitting-balance support: Feet unsupported;Feet supported;No upper extremity supported Sitting balance-Leahy Scale: Normal     Standing balance support: Bilateral upper extremity supported Standing balance-Leahy Scale: Good  Pertinent Vitals/Pain Pain Assessment: No/denies pain    Home Living Family/patient expects to be discharged to:: Private residence Living Arrangements: Alone Available Help at Discharge: Family;Available PRN/intermittently;Other (Comment) (Three adult children live very close by) Type of Home: House Home Access: Stairs  to enter Entrance Stairs-Rails: None Entrance Stairs-Number of Steps: 3 Home Layout: One level Home Equipment: Cane - single point;Shower seat;Grab bars - tub/shower;Hand held Tourist information centre manager - 2 wheels      Prior Function Level of Independence: Independent with assistive device(s)         Comments: Pt Mod Ind with amb limited community distances with SPC and at times uses B SPCs     Hand Dominance   Dominant Hand: Right    Extremity/Trunk Assessment   Upper Extremity Assessment Upper Extremity Assessment: Overall WFL for tasks assessed    Lower Extremity Assessment Lower Extremity Assessment: Generalized weakness       Communication   Communication: No difficulties  Cognition Arousal/Alertness: Awake/alert Behavior During Therapy: WFL for tasks assessed/performed Overall Cognitive Status: Within Functional Limits for tasks assessed                                        General Comments      Exercises Total Joint Exercises Ankle Circles/Pumps: AROM;Both;5 reps Quad Sets: Strengthening;Both;10 reps Gluteal Sets: Strengthening;Both;10 reps Long Arc Quad: AROM;Both;10 reps;15 reps Knee Flexion: AROM;Both;10 reps;15 reps Marching in Standing: AROM;Both;10 reps Other Exercises Other Exercises: Seated B hip flex x 10 Other Exercises: Sit to from stand transfers form various height surfaces with SBA and mod verbal cues for hand placement   Assessment/Plan    PT Assessment Patient needs continued PT services  PT Problem List Decreased activity tolerance;Decreased balance;Decreased mobility       PT Treatment Interventions DME instruction;Gait training;Stair training;Functional mobility training;Neuromuscular re-education;Therapeutic exercise;Therapeutic activities;Balance training;Patient/family education    PT Goals (Current goals can be found in the Care Plan section)  Acute Rehab PT Goals Patient Stated Goal: To get stronger and return  home PT Goal Formulation: With patient Time For Goal Achievement: 08/14/17 Potential to Achieve Goals: Good    Frequency Min 2X/week   Barriers to discharge        Co-evaluation               AM-PAC PT "6 Clicks" Daily Activity  Outcome Measure Difficulty turning over in bed (including adjusting bedclothes, sheets and blankets)?: A Little Difficulty moving from lying on back to sitting on the side of the bed? : A Little Difficulty sitting down on and standing up from a chair with arms (e.g., wheelchair, bedside commode, etc,.)?: A Little Help needed moving to and from a bed to chair (including a wheelchair)?: None Help needed walking in hospital room?: A Little Help needed climbing 3-5 steps with a railing? : A Little 6 Click Score: 19    End of Session Equipment Utilized During Treatment: Gait belt Activity Tolerance: Patient tolerated treatment well Patient left: in chair;with chair alarm set;with family/visitor present;with call bell/phone within reach Nurse Communication: Mobility status PT Visit Diagnosis: Difficulty in walking, not elsewhere classified (R26.2);Muscle weakness (generalized) (M62.81)    Time: 7408-1448 PT Time Calculation (min) (ACUTE ONLY): 52 min   Charges:   PT Evaluation $PT Eval Low Complexity: 1 Low PT Treatments $Therapeutic Exercise: 8-22 mins $Therapeutic Activity: 8-22 mins   PT G Codes:   PT  G-Codes **NOT FOR INPATIENT CLASS** Functional Assessment Tool Used: AM-PAC 6 Clicks Basic Mobility Functional Limitation: Mobility: Walking and moving around Mobility: Walking and Moving Around Current Status [phone]): At least 20 percent but less than 40 percent impaired, limited or restricted Mobility: Walking and Moving Around Goal Status [phone]): At least 1 percent but less than 20 percent impaired, limited or restricted    D. Royetta Asal PT, DPT 08/01/17, 4:57 PM

## 2017-08-01 NOTE — Progress Notes (Signed)
Discussed with MD. Reviewed chart. Patient with long history of dizziness related to BP/orthostasis. Patient noted to be dehydrated in the ED with SCr 2.3 with a baseline of 1. Also noted to be orthostatic. Recommend holding Cardura, as this was previously advised and repletion of fluids. Outpatient follow up advised.

## 2017-08-01 NOTE — Care Management (Signed)
Patient admitted for presyncope.  Patient lives at home alone.  Adult daughters live locally for support.  PCP HARRIS. PT has assessed patient and recommends home health PT.  Patient is agreeable to services.  Home health agency preference provided.  Daughter who is POA selected [facility].  Jason with [facility] given heads up referral.  RNCM following.

## 2017-08-01 NOTE — Care Management Obs Status (Signed)
[place] NOTIFICATION   Patient Details  Name: BRAD LIEURANCE MRN: [id number] Date of Birth: 01/25/1931   Medicare Observation Status Notification Given:  Yes    Beverly Sessions, RN 08/01/2017, 5:49 PM

## 2017-08-01 NOTE — Progress Notes (Signed)
[place] [facility] [place] NAME: [name]    MR#:  [id number]  DATE OF BIRTH:  [birth year]  SUBJECTIVE:  Came in after having fall at home. No headache or LOC  REVIEW OF SYSTEMS:   Review of Systems  Constitutional: Negative for chills, fever and weight loss.  HENT: Negative for ear discharge, ear pain and nosebleeds.   Eyes: Negative for blurred vision, pain and discharge.  Respiratory: Negative for sputum production, shortness of breath, wheezing and stridor.   Cardiovascular: Negative for chest pain, palpitations, orthopnea and PND.  Gastrointestinal: Negative for abdominal pain, diarrhea, nausea and vomiting.  Genitourinary: Negative for frequency and urgency.  Musculoskeletal: Negative for back pain and joint pain.  Neurological: Positive for weakness. Negative for sensory change, speech change and focal weakness.  Psychiatric/Behavioral: Negative for depression and hallucinations. The patient is not nervous/anxious.    Tolerating Diet:yes Tolerating PT: pending  DRUG ALLERGIES:   Allergies  Allergen Reactions  . Budesonide-Formoterol Fumarate Swelling    Mouth and tongue swelling.  . Iohexol Shortness Of Breath and Itching    Pt was given 100cc of Omnipaque 300 followed by itching/ dyspnea.  . Simvastatin Other (See Comments)    REACTION: unknown  . Demerol [Meperidine] Other (See Comments)    REACTION: Hallucinations  . Ivp Dye [Iodinated Diagnostic Agents] Other (See Comments)    Iodine pt had a reaction when he had a CT DONE  . Meperidine Hcl Other (See Comments)    REACTION: Hallucinations  . Tape Other (See Comments)    Tears skin off, Please use "paper" tape  . Naproxen Swelling  . Pregabalin Rash  . Sulfonamide Derivatives Rash    VITALS:  Blood pressure 133/86, pulse 62, temperature 97.7 F (36.5 C), temperature source Oral, resp. rate 18, height 5\' 11"  (1.803 m), weight 67.5 kg (148 lb 12.8 oz), SpO2 99  %.  PHYSICAL EXAMINATION:   Physical Exam  GENERAL:  83 y.o.-year-old patient lying in the bed with no acute distress.  EYES: Pupils equal, round, reactive to light and accommodation. No scleral icterus. Extraocular muscles intact.  HEENT: Head atraumatic, normocephalic. Oropharynx and nasopharynx clear. Laceration forehead--dried scab blood NECK:  Supple, no jugular venous distention. No thyroid enlargement, no tenderness.  LUNGS: Normal breath sounds bilaterally, no wheezing, rales, rhonchi. No use of accessory muscles of respiration.  CARDIOVASCULAR: S1, S2 normal. No murmurs, rubs, or gallops.  ABDOMEN: Soft, nontender, nondistended. Bowel sounds present. No organomegaly or mass.  EXTREMITIES: No cyanosis, clubbing or edema b/l.    NEUROLOGIC: Cranial nerves II through XII are intact. No focal Motor or sensory deficits b/l.   PSYCHIATRIC:  patient is alert and oriented x 3.  SKIN: No obvious rash, lesion, or ulcer.   LABORATORY PANEL:  CBC  Recent Labs Lab 07/31/17 1719  WBC 6.8  HGB 12.4*  HCT 36.2*  PLT 236    Chemistries   Recent Labs Lab 07/31/17 1719 08/01/17 1440  NA 135 140  K 2.9* 4.4  CL 91* 104  CO2 33* 28  GLUCOSE 146* 117*  BUN 27* 21*  CREATININE 2.30* 1.22  CALCIUM 9.2 8.9  AST 30  --   ALT 21  --   ALKPHOS 87  --   BILITOT 1.0  --    Cardiac Enzymes  Recent Labs Lab 08/01/17 1440  TROPONINI <0.03   RADIOLOGY:  Dg Chest 1 View  Result Date: 07/31/2017 CLINICAL DATA:  Fall EXAM: CHEST  1 VIEW COMPARISON:  06/18/2017 FINDINGS: Surgical hardware in the lower cervical spine. Metallic opacity in the left upper quadrant. No acute consolidation or effusion. Normal heart size. Aortic atherosclerosis. No pneumothorax. IMPRESSION: 1. No radiographic evidence for acute cardiopulmonary abnormality. 2. Round metallic opacity in the left upper quadrant for which clinical correlation is recommended. Electronically Signed   By: Donavan Foil M.D.   On:  07/31/2017 17:45   Dg Thoracic Spine 2 View  Result Date: 07/31/2017 CLINICAL DATA:  Fall EXAM: THORACIC SPINE 2 VIEWS COMPARISON:  06/18/2017 FINDINGS: Partially visualized surgical hardware in the cervical spine. Thoracic alignment is within normal limits. Moderate degenerative changes of the thoracic spine. Vertebral body heights appear maintained. IMPRESSION: Degenerative changes.  No definite acute osseous abnormality. Electronically Signed   By: Donavan Foil M.D.   On: 07/31/2017 17:41   Dg Lumbar Spine Complete  Result Date: 07/31/2017 CLINICAL DATA:  Fall with pain EXAM: LUMBAR SPINE - COMPLETE 4+ VIEW COMPARISON:  08/02/2012 FINDINGS: Trace retrolisthesis of L2 on L3 unchanged. Status post posterior stabilization rod and fixating screws from L2 through L4 with interbody device at L3-L4. Moderate degenerative changes at L1-L2 with mild degenerative changes at L4-L5. 12 mm stone projecting over the lower pole of the left kidney. Aortic atherosclerosis IMPRESSION: 1. Postsurgical changes from L2 through L4. No definite acute osseous abnormality. 2. 12 mm stone projecting over lower pole of left kidney Electronically Signed   By: Donavan Foil M.D.   On: 07/31/2017 17:39   Dg Wrist Complete Right  Result Date: 07/31/2017 CLINICAL DATA:  Fall with wrist pain EXAM: RIGHT WRIST - COMPLETE 3+ VIEW COMPARISON:  None. FINDINGS: No acute displaced fracture or malalignment. Advanced arthritis at the first [facility] joint. Cartilage calcifications. Vascular calcifications. IMPRESSION: 1. No acute osseous abnormality 2. Chondrocalcinosis 3. Prominent degenerative changes at the first [facility] joint. Electronically Signed   By: Donavan Foil M.D.   On: 07/31/2017 17:43   Ct Head Wo Contrast  Result Date: 07/31/2017 CLINICAL DATA:  83 year old male post mechanical fall. Denies loss of consciousness. Initial encounter. EXAM: CT HEAD WITHOUT CONTRAST CT MAXILLOFACIAL WITHOUT CONTRAST CT CERVICAL SPINE WITHOUT  CONTRAST TECHNIQUE: Multidetector CT imaging of the head, cervical spine, and maxillofacial structures were performed using the standard protocol without intravenous contrast. Multiplanar CT image reconstructions of the cervical spine and maxillofacial structures were also generated. COMPARISON:  06/19/2017 brain MR. 06/18/2017 head CT. 11/05/2012 cervical spine CT. FINDINGS: CT HEAD FINDINGS Brain: No intracranial hemorrhage or CT evidence of large acute infarct. Chronic microvascular changes. Global atrophy. No intracranial mass lesion noted on this unenhanced exam. Vascular: Vascular calcification Skull: No skull fracture Other: Negative CT MAXILLOFACIAL FINDINGS Osseous: No fracture.  Prominent dental caries. Orbits: Intact. Sinuses: Minimal mucosal thickening maxillary sinuses. Prior sinus surgery. Soft tissues: No acute abnormality. CT CERVICAL SPINE FINDINGS Alignment: Within normal limits. Skull base and vertebrae: No cervical spine fracture. Soft tissues and spinal canal: No abnormal prevertebral soft tissue swelling. Disc levels: Fusion C3-C7. Congenitally narrowed spinal canal. Superimposed mild spurs. Transverse ligament hypertrophy with spinal stenosis and cord flattening. Upper chest: No worrisome abnormality. Other: Carotid bifurcation calcifications. IMPRESSION: CT HEAD No skull fracture or intracranial hemorrhage. Chronic microvascular changes. Global atrophy. CT MAXILLOFACIAL No fracture.  Prominent dental caries. CT CERVICAL SPINE No cervical spine fracture, malalignment or abnormal prevertebral soft tissue swelling. Fusion C3-C7. Congenitally narrowed spinal canal. Superimposed mild spurs. Transverse ligament hypertrophy with spinal stenosis and cord flattening. Electronically Signed   By: Remo Lipps  Jeannine Kitten M.D.   On: 07/31/2017 17:26   Ct Cervical Spine Wo Contrast  Result Date: 07/31/2017 CLINICAL DATA:  83 year old male post mechanical fall. Denies loss of consciousness. Initial encounter.  EXAM: CT HEAD WITHOUT CONTRAST CT MAXILLOFACIAL WITHOUT CONTRAST CT CERVICAL SPINE WITHOUT CONTRAST TECHNIQUE: Multidetector CT imaging of the head, cervical spine, and maxillofacial structures were performed using the standard protocol without intravenous contrast. Multiplanar CT image reconstructions of the cervical spine and maxillofacial structures were also generated. COMPARISON:  06/19/2017 brain MR. 06/18/2017 head CT. 11/05/2012 cervical spine CT. FINDINGS: CT HEAD FINDINGS Brain: No intracranial hemorrhage or CT evidence of large acute infarct. Chronic microvascular changes. Global atrophy. No intracranial mass lesion noted on this unenhanced exam. Vascular: Vascular calcification Skull: No skull fracture Other: Negative CT MAXILLOFACIAL FINDINGS Osseous: No fracture.  Prominent dental caries. Orbits: Intact. Sinuses: Minimal mucosal thickening maxillary sinuses. Prior sinus surgery. Soft tissues: No acute abnormality. CT CERVICAL SPINE FINDINGS Alignment: Within normal limits. Skull base and vertebrae: No cervical spine fracture. Soft tissues and spinal canal: No abnormal prevertebral soft tissue swelling. Disc levels: Fusion C3-C7. Congenitally narrowed spinal canal. Superimposed mild spurs. Transverse ligament hypertrophy with spinal stenosis and cord flattening. Upper chest: No worrisome abnormality. Other: Carotid bifurcation calcifications. IMPRESSION: CT HEAD No skull fracture or intracranial hemorrhage. Chronic microvascular changes. Global atrophy. CT MAXILLOFACIAL No fracture.  Prominent dental caries. CT CERVICAL SPINE No cervical spine fracture, malalignment or abnormal prevertebral soft tissue swelling. Fusion C3-C7. Congenitally narrowed spinal canal. Superimposed mild spurs. Transverse ligament hypertrophy with spinal stenosis and cord flattening. Electronically Signed   By: Genia Del M.D.   On: 07/31/2017 17:26   Dg Hip Unilat W Or Wo Pelvis 2-3 Views Left  Result Date:  07/31/2017 CLINICAL DATA:  Fall EXAM: DG HIP (WITH OR WITHOUT PELVIS) 2-3V LEFT COMPARISON:  08/02/2012 FINDINGS: SI joints are symmetric. Pubic symphysis and rami are intact. Moderate arthritis of the right hip. No acute displaced fracture or malalignment. Mild to moderate arthritis of the left hip. Multiple calcified phleboliths in the pelvis. IMPRESSION: 1. No acute osseous abnormality 2. Bilateral degenerative changes of the right greater than left hips. Electronically Signed   By: Donavan Foil M.D.   On: 07/31/2017 17:35   Ct Maxillofacial Wo Contrast  Result Date: 07/31/2017 CLINICAL DATA:  83 year old male post mechanical fall. Denies loss of consciousness. Initial encounter. EXAM: CT HEAD WITHOUT CONTRAST CT MAXILLOFACIAL WITHOUT CONTRAST CT CERVICAL SPINE WITHOUT CONTRAST TECHNIQUE: Multidetector CT imaging of the head, cervical spine, and maxillofacial structures were performed using the standard protocol without intravenous contrast. Multiplanar CT image reconstructions of the cervical spine and maxillofacial structures were also generated. COMPARISON:  06/19/2017 brain MR. 06/18/2017 head CT. 11/05/2012 cervical spine CT. FINDINGS: CT HEAD FINDINGS Brain: No intracranial hemorrhage or CT evidence of large acute infarct. Chronic microvascular changes. Global atrophy. No intracranial mass lesion noted on this unenhanced exam. Vascular: Vascular calcification Skull: No skull fracture Other: Negative CT MAXILLOFACIAL FINDINGS Osseous: No fracture.  Prominent dental caries. Orbits: Intact. Sinuses: Minimal mucosal thickening maxillary sinuses. Prior sinus surgery. Soft tissues: No acute abnormality. CT CERVICAL SPINE FINDINGS Alignment: Within normal limits. Skull base and vertebrae: No cervical spine fracture. Soft tissues and spinal canal: No abnormal prevertebral soft tissue swelling. Disc levels: Fusion C3-C7. Congenitally narrowed spinal canal. Superimposed mild spurs. Transverse ligament  hypertrophy with spinal stenosis and cord flattening. Upper chest: No worrisome abnormality. Other: Carotid bifurcation calcifications. IMPRESSION: CT HEAD No skull fracture or intracranial hemorrhage. Chronic microvascular  changes. Global atrophy. CT MAXILLOFACIAL No fracture.  Prominent dental caries. CT CERVICAL SPINE No cervical spine fracture, malalignment or abnormal prevertebral soft tissue swelling. Fusion C3-C7. Congenitally narrowed spinal canal. Superimposed mild spurs. Transverse ligament hypertrophy with spinal stenosis and cord flattening. Electronically Signed   By: Genia Del M.D.   On: 07/31/2017 17:26   ASSESSMENT AND PLAN:  83 year old male admitted for falls.  1. Falls: Presyncopal; no loss of consciousness suspected due to dehydration - patient has abrasions and bruises.  -IVF -elevated creatinine  2. Orthostatic hypotension: Hydrate with intravenous fluid. - PT/OT evaluation  3. Hypokalemia: Replete potassium. Contributes to weakness and falls.  4. CAD: Stable; continue aspirin and Plavix.I have decreased the patient's dose of Imdur which may have also been contributing to his hypotension.  5. BPH: Continue Flomax and Cardura ]  6. DVT prophylaxis:SCDs  PT to see pt. CM for d/c planning  Case discussed with Care Management/Social Worker. Management plans discussed with the patient, family and they are in agreement.  CODE STATUS: DNR  DVT Prophylaxis: **heparin  TOTAL TIME TAKING CARE OF THIS PATIENT: *25* minutes.  >50% time spent on counselling and coordination of care  POSSIBLE D/C IN *1-2 DAYS, DEPENDING ON CLINICAL CONDITION.  Note: This dictation was prepared with Dragon dictation along with smaller phrase technology. Any transcriptional errors that result from this process are unintentional.  Lemmie Vanlanen M.D on 08/01/2017 at 3:46 PM  Between 7am to 6pm - Pager - [phone]  After 6pm go to [email or web address] - password [email or web address] [facility]  Hospitalists  Office  [phone]  CC: Primary care physician; [name], FNPPatient ID: [name], male   DOB: 09/22/1931, 83 y.o.   MRN: [id number]

## 2017-08-01 NOTE — Plan of Care (Signed)
Problem: Safety: Goal: Ability to remain free from injury will improve Outcome: Progressing Pt bed at lowest position, call bell within reach and educated on safety awareness and to call for help.

## 2017-08-01 NOTE — Progress Notes (Signed)
Initial Nutrition Assessment  DOCUMENTATION CODES:   Non-severe (moderate) malnutrition in context of chronic illness  INTERVENTION:  1.MVI w/ Minerals  2. Recommend Ensure Enlive po BID, each supplement provides 350 kcal and 20 grams of protein   NUTRITION DIAGNOSIS:   Malnutrition related to chronic illness as evidenced by moderate depletion of body fat, moderate depletions of muscle mass.  GOAL:   Patient will meet greater than or equal to 90% of their needs  MONITOR:   PO intake, I & O's, Labs, Supplement acceptance, Weight trends  REASON FOR ASSESSMENT:   Malnutrition Screening Tool    ASSESSMENT:   [name] has a PMH of CAD, HTN, GERD, HLD, MI presents with presyncopal fall, no LOC, orthostatic hypotension and hypokalemia.  Spoke with [name] at bedside. He reports having felt constipated PTA, with poor PO intake over 1.5 months. States he normally consumes cereal or sausage and bacon for breakfast. Often times will skip lunch or will eat oatmeal raisin cookies. States he hasn't been hungry for dinner but may eat ice cream before bed. Unsure of accuracy of history, patient states he has trouble remembering things. Reports being 172-173 1.5 months ago, now 142 pounds. Per chart appears he was 151 pounds 06/20/2017 and 172 pounds 12/2015. Exhibiting a 24 pound/14% severe weight loss over 7 months. He was unable to provide a reason behind his weight loss. He does report fluid accumulations at one point but he is unable to tell me when this happened. Had chicken breast, toast, eggs for breakfast, ate 100% No acute complaints.  Nutrition-Focused physical exam completed. Findings are moderate fat depletions at arms, eyes, ribs, moderate muscle depletion a temples, clavicles, deltoids, and no edema.   Labs reviewed:  K 2.9 Medications reviewed and include:  Colace, B12  Diet Order:  Diet Heart Room service appropriate? Yes; Fluid consistency: Thin  Skin:  Reviewed,  no issues  Last BM:  08/01/2017  Height:   Ht Readings from Last 1 Encounters:  07/31/17 5\' 11"  (1.803 m)    Weight:   Wt Readings from Last 1 Encounters:  08/01/17 148 lb 12.8 oz (67.5 kg)    Ideal Body Weight:  78.18 kg  BMI:  Body mass index is 20.75 kg/m.  Estimated Nutritional Needs:   Kcal:  1700-1810 calories (MSJ x1.2-1.3)  Protein:  88-101 Grams (1.3-1.5g/kg)  Fluid:  1.7-1.8L  EDUCATION NEEDS:   Education needs addressed  Satira Anis. [name] Isadore, MS, RD LDN Inpatient Clinical Dietitian Pager [phone]

## 2017-08-02 DIAGNOSIS — E860 Dehydration: Secondary | ICD-10-CM | POA: Diagnosis not present

## 2017-08-02 LAB — BASIC METABOLIC PANEL
ANION GAP: 5 (ref 5–15)
BUN: 20 mg/dL (ref 6–20)
CALCIUM: 8.9 mg/dL (ref 8.9–10.3)
CHLORIDE: 106 mmol/L (ref 101–111)
CO2: 28 mmol/L (ref 22–32)
Creatinine, Ser: 1.11 mg/dL (ref 0.61–1.24)
GFR calc non Af Amer: 59 mL/min — ABNORMAL LOW (ref >60)
Glucose, Bld: 114 mg/dL — ABNORMAL HIGH (ref 65–99)
Potassium: 4.3 mmol/L (ref 3.5–5.1)
SODIUM: 139 mmol/L (ref 135–145)

## 2017-08-02 MED ORDER — ISOSORBIDE MONONITRATE ER 60 MG PO TB24: 60.0000 mg | ORAL_TABLET | Freq: Every day | ORAL | 0 refills | Status: DC

## 2017-08-02 MED ORDER — ENOXAPARIN SODIUM 40 MG/0.4ML ~~LOC~~ SOLN: 40.0000 mg | SUBCUTANEOUS | Status: DC

## 2017-08-02 MED ORDER — ENSURE ENLIVE PO LIQD: 237.0000 mL | Freq: Two times a day (BID) | ORAL | 12 refills | Status: DC

## 2017-08-02 NOTE — Progress Notes (Signed)
Anticoagulation monitoring(Lovenox):  83yo  male ordered Lovenox 30 mg Q24h  Filed Weights   07/31/17 2148 08/01/17 0300 08/02/17 0500  Weight: 149 lb 14.4 oz (68 kg) 148 lb 12.8 oz (67.5 kg) 154 lb (69.9 kg)   BMI 21.49   Lab Results  Component Value Date   CREATININE 1.11 08/02/2017   CREATININE 1.22 08/01/2017   CREATININE 2.30 (H) 07/31/2017   Estimated Creatinine Clearance: 49 mL/min (by C-G formula based on SCr of 1.11 mg/dL). Hemoglobin & Hematocrit     Component Value Date/Time   HGB 12.4 (L) 07/31/2017 1719   HCT 36.2 (L) 07/31/2017 1719     Per Protocol for Patient with estCrcl > 30 ml/min and BMI < 40, will transition to Lovenox 40 mg [id number].

## 2017-08-02 NOTE — Progress Notes (Signed)
Patient is discharge in a stable condition, summary and f/u care given to both pt and son , verbalized understanding

## 2017-08-02 NOTE — Discharge Summary (Signed)
[facility] [place] NAME: [name]    MR#:  [id number]  DATE OF BIRTH:  [birth year]  DATE OF ADMISSION:  07/31/2017 ADMITTING PHYSICIAN: Harrie Foreman, MD  DATE OF DISCHARGE: 08/02/17  PRIMARY CARE PHYSICIAN: [name], FNP    ADMISSION DIAGNOSIS:  Orthostatic hypotension [I95.1] Dehydration [E86.0] Hypokalemia [E87.6] Fall [W19.XXXA] Acute renal insufficiency [N28.9] Elevated troponin [R74.8] Multiple skin tears [T14.8XXA] Fall, initial encounter [id number]] Facial contusion, initial encounter [S00.83XA] Facial abrasion, initial encounter [S00.81XA]  DISCHARGE DIAGNOSIS:  Acute renal failure due to dehydration-resolved Fall with right elbow skin tear SECONDARY DIAGNOSIS:   Past Medical History:  Diagnosis Date  . Arthritis    "hips, back" (06/17/2015)  . Asthma   . Chronic chest pain   . Chronic lower back pain   . Coronary artery disease    a. s/p BMS to RCA in 2002; b. s/p cutting balloon POBA ;   c. cath 6/12: oDx 80% (treated with repeat cutting balloon POBA), mLAD 50% with 30-40% at Dx, CFX 30%, pRCA 25% with patent stents;  d.  Lex MV 4/14:  Low Risk - EF 61%, inf scar with peri-infarct ischemia  . Dyspnea    chronic  . GERD (gastroesophageal reflux disease)    h/o esophageal spasm  . Headache   . History of blood transfusion    "related to OR"  . History of hiatal hernia   . Hyperlipidemia   . Hypertension   . Melanoma of lower back ([name]) late 1990's  . Memory loss   . Myocardial infarction ([place]) 2001   x 1, confirned 1 possible    HOSPITAL COURSE:   83 year old male admitted for falls.  1. Falls: Presyncopal; no loss of consciousness suspected due to dehydration/acute renal failure - patient has abrasions and bruises.  -received IVF -came in with elevated creatinine of 2.3--- 1.1  2. Orthostatic hypotension: Hydrate with intravenous fluid. - PT/OT evaluation---ecommend home  health PT which has been arranged  3. Hypokalemia: Repleted potassium. Contributes to weakness and falls.  4. CAD: Stable; continue aspirin and Plavix.I have decreased the patient's dose of Imdur which may have also been contributing to his hypotension.  5. BPH: Continue Flomax and Cardura ]  6. DVT prophylaxis:SCDs  CM for d/c planning  Spoke with patient's son Mr. Mancel Bale on the phone. Overall stable discharge home CONSULTS OBTAINED:    DRUG ALLERGIES:   Allergies  Allergen Reactions  . Budesonide-Formoterol Fumarate Swelling    Mouth and tongue swelling.  . Iohexol Shortness Of Breath and Itching    Pt was given 100cc of Omnipaque 300 followed by itching/ dyspnea.  . Simvastatin Other (See Comments)    REACTION: unknown  . Demerol [Meperidine] Other (See Comments)    REACTION: Hallucinations  . Ivp Dye [Iodinated Diagnostic Agents] Other (See Comments)    Iodine pt had a reaction when he had a CT DONE  . Meperidine Hcl Other (See Comments)    REACTION: Hallucinations  . Tape Other (See Comments)    Tears skin off, Please use "paper" tape  . Naproxen Swelling  . Pregabalin Rash  . Sulfonamide Derivatives Rash    DISCHARGE MEDICATIONS:   Current Discharge Medication List    START taking these medications   Details  feeding supplement, ENSURE ENLIVE, (ENSURE ENLIVE) LIQD Take 237 mLs by mouth 2 (two) times daily between meals. Qty: 237 mL, Refills: 12      CONTINUE these medications  which have CHANGED   Details  isosorbide mononitrate (IMDUR) 60 MG 24 hr tablet Take 1 tablet (60 mg total) by mouth daily. Qty: 22 tablet, Refills: 0      CONTINUE these medications which have NOT CHANGED   Details  aspirin EC 81 MG tablet Take 1 tablet (81 mg total) by mouth daily.    atorvastatin (LIPITOR) 40 MG tablet Take 40 mg by mouth daily.     clopidogrel (PLAVIX) 75 MG tablet TAKE 1 TABLET BY MOUTH ONCE DAILY. Qty: 15 tablet, Refills: 0    doxazosin (CARDURA)  1 MG tablet TAKE 1 TABLET BY MOUTH ONCE DAILY. Qty: 15 tablet, Refills: 0    metoprolol tartrate (LOPRESSOR) 25 MG tablet TAKE 1 TABLET BY MOUTH TWICE DAILY Qty: 30 tablet, Refills: 0    pantoprazole (PROTONIX) 40 MG tablet TAKE 1 TABLET BY MOUTH TWICE DAILY Qty: 30 tablet, Refills: 0    tamsulosin (FLOMAX) 0.4 MG CAPS capsule Take 0.4 mg by mouth daily.     acetaminophen (TYLENOL) 325 MG tablet Take 2 tablets (650 mg total) by mouth every 4 (four) hours as needed for headache or mild pain.    cyclobenzaprine (FLEXERIL) 5 MG tablet Take 7.5 mg by mouth 3 (three) times daily as needed for muscle spasms.     HYDROcodone-acetaminophen (NORCO) 7.5-325 MG per tablet Take 1 tablet by mouth every 4 (four) hours as needed for moderate pain. FOR PAIN    nitroGLYCERIN (NITROSTAT) 0.4 MG SL tablet Place 1 tablet (0.4 mg total) under the tongue every 5 (five) minutes as needed for chest pain. Qty: 25 tablet, Refills: 3    vitamin B-12 1000 MCG tablet Take 1 tablet (1,000 mcg total) by mouth daily. Qty: 30 tablet, Refills: 0        If you experience worsening of your admission symptoms, develop shortness of breath, life threatening emergency, suicidal or homicidal thoughts you must seek medical attention immediately by calling 911 or calling your MD immediately  if symptoms less severe.  You Must read complete instructions/literature along with all the possible adverse reactions/side effects for all the Medicines you take and that have been prescribed to you. Take any new Medicines after you have completely understood and accept all the possible adverse reactions/side effects.   Please note  You were cared for by a hospitalist during your hospital stay. If you have any questions about your discharge medications or the care you received while you were in the hospital after you are discharged, you can call the unit and asked to speak with the hospitalist on call if the hospitalist that took care of  you is not available. Once you are discharged, your primary care physician will handle any further medical issues. Please note that NO REFILLS for any discharge medications will be authorized once you are discharged, as it is imperative that you return to your primary care physician (or establish a relationship with a primary care physician if you do not have one) for your aftercare needs so that they can reassess your need for medications and monitor your lab values. Today   SUBJECTIVE   No new complaints  VITAL SIGNS:  Blood pressure (!) 163/88, pulse (!) 58, temperature 98.3 F (36.8 C), temperature source Oral, resp. rate 18, height 5\' 11"  (1.803 m), weight 69.9 kg (154 lb), SpO2 100 %.  I/O:   Intake/Output Summary (Last 24 hours) at 08/02/17 0855 Last data filed at 08/02/17 0757  Gross per 24 hour  Intake  2162.5 ml  Output             1100 ml  Net           1062.5 ml    PHYSICAL EXAMINATION:  GENERAL:  83 y.o.-year-old patient lying in the bed with no acute distress.  EYES: Pupils equal, round, reactive to light and accommodation. No scleral icterus. Extraocular muscles intact.  HEENT: Head atraumatic, normocephalic. Oropharynx and nasopharynx clear.  NECK:  Supple, no jugular venous distention. No thyroid enlargement, no tenderness.  LUNGS: Normal breath sounds bilaterally, no wheezing, rales,rhonchi or crepitation. No use of accessory muscles of respiration.  CARDIOVASCULAR: S1, S2 normal. No murmurs, rubs, or gallops.  ABDOMEN: Soft, non-tender, non-distended. Bowel sounds present. No organomegaly or mass.  EXTREMITIES: No pedal edema, cyanosis, or clubbing. Right elbow skin tear NEUROLOGIC: Cranial nerves II through XII are intact. Muscle strength 5/5 in all extremities. Sensation intact. Gait not checked.  PSYCHIATRIC: The patient is alert and oriented x 3.  SKIN: No obvious rash, lesion, or ulcer.   DATA REVIEW:   CBC   Recent Labs Lab 07/31/17 1719  WBC  6.8  HGB 12.4*  HCT 36.2*  PLT 236    Chemistries   Recent Labs Lab 07/31/17 1719  08/02/17 0347  NA 135  < > 139  K 2.9*  < > 4.3  CL 91*  < > 106  CO2 33*  < > 28  GLUCOSE 146*  < > 114*  BUN 27*  < > 20  CREATININE 2.30*  < > 1.11  CALCIUM 9.2  < > 8.9  AST 30  --   --   ALT 21  --   --   ALKPHOS 87  --   --   BILITOT 1.0  --   --   < > = values in this interval not displayed.  Microbiology Results   No results found for this or any previous visit (from the past 240 hour(s)).  RADIOLOGY:  Dg Chest 1 View  Result Date: 07/31/2017 CLINICAL DATA:  Fall EXAM: CHEST 1 VIEW COMPARISON:  06/18/2017 FINDINGS: Surgical hardware in the lower cervical spine. Metallic opacity in the left upper quadrant. No acute consolidation or effusion. Normal heart size. Aortic atherosclerosis. No pneumothorax. IMPRESSION: 1. No radiographic evidence for acute cardiopulmonary abnormality. 2. Round metallic opacity in the left upper quadrant for which clinical correlation is recommended. Electronically Signed   By: Donavan Foil M.D.   On: 07/31/2017 17:45   Dg Thoracic Spine 2 View  Result Date: 07/31/2017 CLINICAL DATA:  Fall EXAM: THORACIC SPINE 2 VIEWS COMPARISON:  06/18/2017 FINDINGS: Partially visualized surgical hardware in the cervical spine. Thoracic alignment is within normal limits. Moderate degenerative changes of the thoracic spine. Vertebral body heights appear maintained. IMPRESSION: Degenerative changes.  No definite acute osseous abnormality. Electronically Signed   By: Donavan Foil M.D.   On: 07/31/2017 17:41   Dg Lumbar Spine Complete  Result Date: 07/31/2017 CLINICAL DATA:  Fall with pain EXAM: LUMBAR SPINE - COMPLETE 4+ VIEW COMPARISON:  08/02/2012 FINDINGS: Trace retrolisthesis of L2 on L3 unchanged. Status post posterior stabilization rod and fixating screws from L2 through L4 with interbody device at L3-L4. Moderate degenerative changes at L1-L2 with mild degenerative  changes at L4-L5. 12 mm stone projecting over the lower pole of the left kidney. Aortic atherosclerosis IMPRESSION: 1. Postsurgical changes from L2 through L4. No definite acute osseous abnormality. 2. 12 mm stone projecting over lower pole of left  kidney Electronically Signed   By: Donavan Foil M.D.   On: 07/31/2017 17:39   Dg Wrist Complete Right  Result Date: 07/31/2017 CLINICAL DATA:  Fall with wrist pain EXAM: RIGHT WRIST - COMPLETE 3+ VIEW COMPARISON:  None. FINDINGS: No acute displaced fracture or malalignment. Advanced arthritis at the first [facility] joint. Cartilage calcifications. Vascular calcifications. IMPRESSION: 1. No acute osseous abnormality 2. Chondrocalcinosis 3. Prominent degenerative changes at the first [facility] joint. Electronically Signed   By: Donavan Foil M.D.   On: 07/31/2017 17:43   Ct Head Wo Contrast  Result Date: 07/31/2017 CLINICAL DATA:  83 year old male post mechanical fall. Denies loss of consciousness. Initial encounter. EXAM: CT HEAD WITHOUT CONTRAST CT MAXILLOFACIAL WITHOUT CONTRAST CT CERVICAL SPINE WITHOUT CONTRAST TECHNIQUE: Multidetector CT imaging of the head, cervical spine, and maxillofacial structures were performed using the standard protocol without intravenous contrast. Multiplanar CT image reconstructions of the cervical spine and maxillofacial structures were also generated. COMPARISON:  06/19/2017 brain MR. 06/18/2017 head CT. 11/05/2012 cervical spine CT. FINDINGS: CT HEAD FINDINGS Brain: No intracranial hemorrhage or CT evidence of large acute infarct. Chronic microvascular changes. Global atrophy. No intracranial mass lesion noted on this unenhanced exam. Vascular: Vascular calcification Skull: No skull fracture Other: Negative CT MAXILLOFACIAL FINDINGS Osseous: No fracture.  Prominent dental caries. Orbits: Intact. Sinuses: Minimal mucosal thickening maxillary sinuses. Prior sinus surgery. Soft tissues: No acute abnormality. CT CERVICAL SPINE FINDINGS  Alignment: Within normal limits. Skull base and vertebrae: No cervical spine fracture. Soft tissues and spinal canal: No abnormal prevertebral soft tissue swelling. Disc levels: Fusion C3-C7. Congenitally narrowed spinal canal. Superimposed mild spurs. Transverse ligament hypertrophy with spinal stenosis and cord flattening. Upper chest: No worrisome abnormality. Other: Carotid bifurcation calcifications. IMPRESSION: CT HEAD No skull fracture or intracranial hemorrhage. Chronic microvascular changes. Global atrophy. CT MAXILLOFACIAL No fracture.  Prominent dental caries. CT CERVICAL SPINE No cervical spine fracture, malalignment or abnormal prevertebral soft tissue swelling. Fusion C3-C7. Congenitally narrowed spinal canal. Superimposed mild spurs. Transverse ligament hypertrophy with spinal stenosis and cord flattening. Electronically Signed   By: Genia Del M.D.   On: 07/31/2017 17:26   Ct Cervical Spine Wo Contrast  Result Date: 07/31/2017 CLINICAL DATA:  83 year old male post mechanical fall. Denies loss of consciousness. Initial encounter. EXAM: CT HEAD WITHOUT CONTRAST CT MAXILLOFACIAL WITHOUT CONTRAST CT CERVICAL SPINE WITHOUT CONTRAST TECHNIQUE: Multidetector CT imaging of the head, cervical spine, and maxillofacial structures were performed using the standard protocol without intravenous contrast. Multiplanar CT image reconstructions of the cervical spine and maxillofacial structures were also generated. COMPARISON:  06/19/2017 brain MR. 06/18/2017 head CT. 11/05/2012 cervical spine CT. FINDINGS: CT HEAD FINDINGS Brain: No intracranial hemorrhage or CT evidence of large acute infarct. Chronic microvascular changes. Global atrophy. No intracranial mass lesion noted on this unenhanced exam. Vascular: Vascular calcification Skull: No skull fracture Other: Negative CT MAXILLOFACIAL FINDINGS Osseous: No fracture.  Prominent dental caries. Orbits: Intact. Sinuses: Minimal mucosal thickening maxillary  sinuses. Prior sinus surgery. Soft tissues: No acute abnormality. CT CERVICAL SPINE FINDINGS Alignment: Within normal limits. Skull base and vertebrae: No cervical spine fracture. Soft tissues and spinal canal: No abnormal prevertebral soft tissue swelling. Disc levels: Fusion C3-C7. Congenitally narrowed spinal canal. Superimposed mild spurs. Transverse ligament hypertrophy with spinal stenosis and cord flattening. Upper chest: No worrisome abnormality. Other: Carotid bifurcation calcifications. IMPRESSION: CT HEAD No skull fracture or intracranial hemorrhage. Chronic microvascular changes. Global atrophy. CT MAXILLOFACIAL No fracture.  Prominent dental caries. CT CERVICAL SPINE No cervical spine  fracture, malalignment or abnormal prevertebral soft tissue swelling. Fusion C3-C7. Congenitally narrowed spinal canal. Superimposed mild spurs. Transverse ligament hypertrophy with spinal stenosis and cord flattening. Electronically Signed   By: Genia Del M.D.   On: 07/31/2017 17:26   Dg Hip Unilat W Or Wo Pelvis 2-3 Views Left  Result Date: 07/31/2017 CLINICAL DATA:  Fall EXAM: DG HIP (WITH OR WITHOUT PELVIS) 2-3V LEFT COMPARISON:  08/02/2012 FINDINGS: SI joints are symmetric. Pubic symphysis and rami are intact. Moderate arthritis of the right hip. No acute displaced fracture or malalignment. Mild to moderate arthritis of the left hip. Multiple calcified phleboliths in the pelvis. IMPRESSION: 1. No acute osseous abnormality 2. Bilateral degenerative changes of the right greater than left hips. Electronically Signed   By: Donavan Foil M.D.   On: 07/31/2017 17:35   Ct Maxillofacial Wo Contrast  Result Date: 07/31/2017 CLINICAL DATA:  83 year old male post mechanical fall. Denies loss of consciousness. Initial encounter. EXAM: CT HEAD WITHOUT CONTRAST CT MAXILLOFACIAL WITHOUT CONTRAST CT CERVICAL SPINE WITHOUT CONTRAST TECHNIQUE: Multidetector CT imaging of the head, cervical spine, and maxillofacial  structures were performed using the standard protocol without intravenous contrast. Multiplanar CT image reconstructions of the cervical spine and maxillofacial structures were also generated. COMPARISON:  06/19/2017 brain MR. 06/18/2017 head CT. 11/05/2012 cervical spine CT. FINDINGS: CT HEAD FINDINGS Brain: No intracranial hemorrhage or CT evidence of large acute infarct. Chronic microvascular changes. Global atrophy. No intracranial mass lesion noted on this unenhanced exam. Vascular: Vascular calcification Skull: No skull fracture Other: Negative CT MAXILLOFACIAL FINDINGS Osseous: No fracture.  Prominent dental caries. Orbits: Intact. Sinuses: Minimal mucosal thickening maxillary sinuses. Prior sinus surgery. Soft tissues: No acute abnormality. CT CERVICAL SPINE FINDINGS Alignment: Within normal limits. Skull base and vertebrae: No cervical spine fracture. Soft tissues and spinal canal: No abnormal prevertebral soft tissue swelling. Disc levels: Fusion C3-C7. Congenitally narrowed spinal canal. Superimposed mild spurs. Transverse ligament hypertrophy with spinal stenosis and cord flattening. Upper chest: No worrisome abnormality. Other: Carotid bifurcation calcifications. IMPRESSION: CT HEAD No skull fracture or intracranial hemorrhage. Chronic microvascular changes. Global atrophy. CT MAXILLOFACIAL No fracture.  Prominent dental caries. CT CERVICAL SPINE No cervical spine fracture, malalignment or abnormal prevertebral soft tissue swelling. Fusion C3-C7. Congenitally narrowed spinal canal. Superimposed mild spurs. Transverse ligament hypertrophy with spinal stenosis and cord flattening. Electronically Signed   By: Genia Del M.D.   On: 07/31/2017 17:26     Management plans discussed with the patient, family and they are in agreement.  CODE STATUS:     Code Status Orders        Start     Ordered   07/31/17 2141  Do not attempt resuscitation (DNR)  Continuous    Question Answer Comment  In the  event of cardiac or respiratory ARREST Do not call a "code blue"   In the event of cardiac or respiratory ARREST Do not perform Intubation, CPR, defibrillation or ACLS   In the event of cardiac or respiratory ARREST Use medication by any route, position, wound care, and other measures to relive pain and suffering. May use oxygen, suction and manual treatment of airway obstruction as needed for comfort.      07/31/17 2140    Code Status History    Date Active Date Inactive Code Status Order ID Comments User Context   06/19/2017  7:23 AM 06/20/2017 10:30 PM DNR [id number]  Rise Patience, MD Inpatient   06/18/2017 11:57 PM 06/19/2017  7:23 AM Full Code  [id number]  Rise Patience, MD ED   11/14/2015  4:51 PM 11/15/2015  7:21 PM Full Code [id number]  Dorothy Spark, MD Inpatient   06/17/2015  5:27 PM 06/18/2015  9:03 PM Full Code [id number]  Francesca Oman, DO Inpatient   03/03/2014  2:12 AM 03/05/2014  5:58 PM Full Code [id number]  Bynum Bellows, MD Inpatient   01/13/2014  1:24 PM 01/13/2014  6:43 PM Full Code [id number]  Sherren Mocha, MD Inpatient   12/14/2013  4:27 PM 12/16/2013  8:55 PM Full Code [id number]  Arnoldo Lenis, MD ED    Advance Directive Documentation     Most Recent Value  Type of Advance Directive  Healthcare Power of Attorney, Living will  Pre-existing out of facility DNR order (yellow form or pink MOST form)  -  "MOST" Form in Place?  -      TOTAL TIME TAKING CARE OF THIS PATIENT: 40 minutes.    Selam Pietsch M.D on 08/02/2017 at 8:55 AM  Between 7am to 6pm - Pager - [phone] After 6pm go to [email or web address] - password [email or web address]  Office  [phone]  CC: Primary care physician; [name], FNP

## 2017-08-02 NOTE — Care Management (Signed)
Patient to discharge home today.  Home health PT orders in place.  [name] with [facility] notified of discharge.  Per RN patient's son to transport at discharge. RNCM signing off.

## 2017-08-13 NOTE — Progress Notes (Signed)
Cardiology Office Note:    Date:  08/14/2017   ID:  [name], DOB 1930/11/09, MRN [id number]  PCP:  [name], FNP  Cardiologist:  Dr. Dorris Carnes    Referring MD: [name], FNP   Chief Complaint  Patient presents with  . Hospitalization Follow-up    Orthostatic hypotension, acute kidney injury    History of Present Illness:    [name] is a 83 y.o. male with a hx of coronary artery disease s/p stent to the RCA and cutting balloon angioplasty to the diagonal, chest pain, hypertension, hyperlipidemia, GERD.  Last Cardiac Catheterization in 2015 demonstrated patent RCA stent. There was 70-75% stenosis in the D1 that was not hemodynamically significant by FFR.  Admitted in 5/15 with GI bleed (possibly diverticular).  He also has a hx of dizziness related to orthostatic hypotension.  Last seen by Dr. Dorris Carnes in 3/17.  His blood pressure medications were adjusted at that time.  Admitted in 10/15-10/17 with falls and near syncope in the setting of dehydration and acute renal insufficiency.  Of note, he had a 40 point drop in blood pressure with orthostatic vital sign evaluation in the emergency department.  His creatinine was 2.3 upon admission (1.11 at discharge).  He was given IVFs.  His dose of Imdur was reduced.    [name] returns for follow-up, post hospitalization.  He is here with his son.  The patient tells me that he was getting out of bed when he fell.  He is not sure if he passed out.  However, he does remember falling.  Since discharge, he denies chest discomfort.  He does note chronic dyspnea with moderate activities.  This is unchanged.  He denies paroxysmal nocturnal dyspnea.  He does note lower extremity edema.  He does note occasional dizziness with standing.  He denies any bleeding issues.  His son notes that he has witnessed the patient not breathing for several seconds during sleep in the past.  Prior CV studies:   The following studies  were reviewed today:  Echo 06/20/17 Mod conc LVH, EF 55-60, no RWMA, Gr 1 DD, mild LAE, trivial eff  Nuclear stress test 11/15/15 IMPRESSION: 1. No reversible ischemia or infarction. 2. Normal left ventricular wall motion. 3. Left ventricular ejection fraction 55% 4. Intermediate-risk stress test findings*.  Nuclear stress test 01/29/15 Poss small inferobasal infarct, no ischemia, EF 50, Low Risk  Cardiac Catheterization 01/13/14 LAD mid 40-50, ostial D1 70-75 (FFR normal) LCx irregs RCA mid stent patent with 30-40 ISR   Nuclear stress test 12/16/13 IMPRESSION: Moderate inferior wall infarct from apex to base with no ischemia EF 47%  Event monitor 10/14 NSR, isolated PVC  Past Medical History:  Diagnosis Date  . Arthritis    "hips, back" (06/17/2015)  . Asthma   . Chronic chest pain   . Chronic lower back pain   . Coronary artery disease    a. s/p BMS to RCA in 2002; b. s/p cutting balloon POBA ;   c. cath 6/12: oDx 80% (treated with repeat cutting balloon POBA), mLAD 50% with 30-40% at Dx, CFX 30%, pRCA 25% with patent stents;  d.  Lex MV 4/14:  Low Risk - EF 61%, inf scar with peri-infarct ischemia  . Dyspnea    chronic  . GERD (gastroesophageal reflux disease)    h/o esophageal spasm  . Headache   . History of blood transfusion    "related to OR"  . History  of hiatal hernia   . Hyperlipidemia   . Hypertension   . Melanoma of lower back ([place]) late 1990's  . Memory loss   . Myocardial infarction ([place]) 2001   x 1, confirned 1 possible    Past Surgical History:  Procedure Laterality Date  . ANTERIOR CERVICAL DECOMP/DISCECTOMY FUSION    . BACK SURGERY  multiple  . CARDIAC CATHETERIZATION     "I've had 17 caths" (06/17/2015)  . CATARACT EXTRACTION W/ INTRAOCULAR LENS  IMPLANT, BILATERAL Bilateral   . CERVICAL DISC SURGERY  multiple  . COLONOSCOPY WITH PROPOFOL N/A 04/17/2014   Procedure: COLONOSCOPY WITH PROPOFOL;  Surgeon: Winfield Cunas., MD;  Location: [facility]  ENDOSCOPY;  Service: Endoscopy;  Laterality: N/A;  . CORONARY ANGIOPLASTY WITH STENT PLACEMENT  x 2 stents    previous percutaneous intervention on the  RCA and the diagonal branch  . ESOPHAGOGASTRODUODENOSCOPY  03/23/2012   Procedure: ESOPHAGOGASTRODUODENOSCOPY (EGD);  Surgeon: Winfield Cunas., MD;  Location: [facility] ENDOSCOPY;  Service: Endoscopy;  Laterality: N/A;  . ESOPHAGOGASTRODUODENOSCOPY N/A 03/04/2014   Procedure: ESOPHAGOGASTRODUODENOSCOPY (EGD);  Surgeon: Arta Silence, MD;  Location: [facility] ENDOSCOPY;  Service: Endoscopy;  Laterality: N/A;  . LAMINECTOMY    . LEFT HEART CATHETERIZATION WITH CORONARY ANGIOGRAM N/A 01/13/2014   Procedure: LEFT HEART CATHETERIZATION WITH CORONARY ANGIOGRAM;  Surgeon: Blane Ohara, MD;  Location: [facility] CATH LAB;  Service: Cardiovascular;  Laterality: N/A;  . MELANOMA EXCISION  late 1990's   "lower back"  . POSTERIOR LAMINECTOMY / DECOMPRESSION CERVICAL SPINE    . SAVORY DILATION  03/23/2012   Procedure: SAVORY DILATION;  Surgeon: Winfield Cunas., MD;  Location: [facility] ENDOSCOPY;  Service: Endoscopy;  Laterality: N/A;  . TONSILLECTOMY  1930's    Current Medications: Current Meds  Medication Sig  . acetaminophen (TYLENOL) 325 MG tablet Take 2 tablets (650 mg total) by mouth every 4 (four) hours as needed for headache or mild pain.  Marland Kitchen aspirin EC 81 MG tablet Take 1 tablet (81 mg total) by mouth daily.  . cyclobenzaprine (FLEXERIL) 5 MG tablet Take 7.5 mg by mouth 3 (three) times daily as needed for muscle spasms.   Marland Kitchen doxazosin (CARDURA) 1 MG tablet TAKE 1 TABLET BY MOUTH ONCE DAILY.  . feeding supplement, ENSURE ENLIVE, (ENSURE ENLIVE) LIQD Take 237 mLs by mouth 2 (two) times daily between meals.  Marland Kitchen HYDROcodone-acetaminophen (NORCO) 7.5-325 MG per tablet Take 1 tablet by mouth every 4 (four) hours as needed for moderate pain. FOR PAIN  . nitroGLYCERIN (NITROSTAT) 0.4 MG SL tablet Place 1 tablet (0.4 mg total) under the tongue every 5 (five) minutes as needed  for chest pain.  . pantoprazole (PROTONIX) 40 MG tablet TAKE 1 TABLET BY MOUTH TWICE DAILY  . tamsulosin (FLOMAX) 0.4 MG CAPS capsule Take 0.4 mg by mouth daily.   . vitamin B-12 1000 MCG tablet Take 1 tablet (1,000 mcg total) by mouth daily.     Allergies:   Budesonide-formoterol fumarate; Iohexol; Simvastatin; Demerol [meperidine]; Ivp dye [iodinated diagnostic agents]; Meperidine hcl; Tape; Naproxen; Pregabalin; and Sulfonamide derivatives   Social History  Substance Use Topics  . Smoking status: Never Smoker  . Smokeless tobacco: Never Used  . Alcohol use No     Family Hx: The patient's family history includes Asthma in his father; Colitis in his son; Colon cancer in his son; Crohn's disease in his son; Diabetes in his father; Heart disease in his father and mother; Prostate cancer in his paternal grandfather.  ROS:   Please see the history of present illness.    Review of Systems  Constitution: Positive for decreased appetite and malaise/fatigue.  HENT: Positive for hearing loss.   Cardiovascular: Positive for dyspnea on exertion and leg swelling.  Hematologic/Lymphatic: Bruises/bleeds easily.  Musculoskeletal: Positive for joint pain, joint swelling and muscle weakness.  Gastrointestinal: Positive for abdominal pain, constipation and diarrhea.  Neurological: Positive for dizziness and loss of balance.   All other systems reviewed and are negative.   EKGs/Labs/Other Test Reviewed:    EKG:  EKG is  ordered today.  The ekg ordered today demonstrates normal sinus rhythm, heart rate 77, leftward axis, PVC, QTC 427 ms  Recent Labs: 07/31/2017: ALT 21; Hemoglobin 12.4; Platelets 236; TSH 1.594 08/02/2017: BUN 20; Creatinine, Ser 1.11; Potassium 4.3; Sodium 139   Recent Lipid Panel Lab Results  Component Value Date/Time   CHOL 123 01/19/2015 01:15 PM   TRIG 71.0 01/19/2015 01:15 PM   HDL 45.60 01/19/2015 01:15 PM   CHOLHDL 3 01/19/2015 01:15 PM   LDLCALC 63 01/19/2015  01:15 PM    Physical Exam:    VS:  Ht 5\' 9"  (1.753 m)   Wt 165 lb 1.9 oz (74.9 kg)   BMI 24.38 kg/m     Orthostatic VS for the past 24 hrs (Last 3 readings):  BP- Lying Pulse- Lying BP- Sitting Pulse- Sitting BP- Standing at 0 minutes Pulse- Standing at 0 minutes BP- Standing at 3 minutes Pulse- Standing at 3 minutes  08/14/17 0858 164/82 77 157/82 (!) 43 145/75 84 148/77 86     Wt Readings from Last 3 Encounters:  08/14/17 165 lb 1.9 oz (74.9 kg)  08/02/17 154 lb (69.9 kg)  06/20/17 151 lb 9.6 oz (68.8 kg)     Physical Exam  Constitutional: He is oriented to person, place, and time. He appears well-developed and well-nourished. No distress.  HENT:  Head: Normocephalic and atraumatic.  Eyes: No scleral icterus.  Neck: No JVD present.  Cardiovascular: Normal rate and regular rhythm.   No murmur heard. Pulmonary/Chest: Effort normal. He has no rales.  Abdominal: Soft.  Musculoskeletal: He exhibits edema (1+ bilat LE edema).  Neurological: He is alert and oriented to person, place, and time.  Skin: Skin is warm and dry.  Psychiatric: He has a normal mood and affect.    ASSESSMENT:    1. Orthostatic hypotension   2. Coronary artery disease involving native coronary artery of native heart without angina pectoris   3. Essential hypertension   4. Hyperlipidemia, unspecified hyperlipidemia type   5. Apnea   6. AKI (acute kidney injury) ([place])    PLAN:    In order of problems listed above:  1.  Orthostatic hypotension Patient was recently admitted to the hospital with a fall and documented orthostatic hypotension with a 40 point drop in his blood pressure from lying to standing.  His labs indicated acute kidney injury with creatinine over 2.  This did normalize prior to discharge with IV fluids.  He has had much less dizziness with standing since discharge from the hospital.  His son notes that he is eating better as well.  His blood pressure is elevated today and is higher  when lying supine.  However, it continues to drop from 164 lying to 145 standing.  Therefore, I do not think we should up titrate his hypertensive medications.  I think it would be best to avoid medical therapy for orthostatic hypotension.  We discussed conservative management to  include standing slowly and wearing compression stockings.  I have recommended that he obtain compression stockings and wear these as much as possible.  If he continues to have significant orthostatic hypotension at follow-up, we could consider pyridostigmine.  Obtain follow-up BMET today.  2.  Coronary artery disease involving native coronary artery of native heart without angina pectoris Status post prior PCI to the RCA.  Cardiac catheterization 2015 with patent RCA stent.  There was moderate disease in the ostial diagonal that was not hemodynamically significant by FFR.  Last nuclear stress test in 2017 demonstrated no ischemia.  He is not currently having anginal symptoms.  He will continue aspirin, Plavix, statin, nitrates, metoprolol.  Of note, his isosorbide dose was reduced in the hospital in the setting of orthostatic hypotension.  He has been tolerating his current medical regimen without significant chest discomfort.  3.  Essential hypertension Blood pressure is elevated.  However, given orthostatic hypotension, I have suggested that we not adjust his medications for now.  We will try to manage his orthostatic hypotension conservatively as outlined above.  4.  Hyperlipidemia, unspecified hyperlipidemia type Continue statin therapy.  5.  Apnea His son describes symptoms of sleep apnea.  The patient is not ready to pursue sleep testing at this time.  I have asked him to continue to consider this and discuss it with Dr. Harrington Challenger in follow-up.  6.  Acute kidney injury Creatinine did improve back to normal prior to discharge.  Follow-up basic metabolic panel will be obtained today.   Dispo:  Return in about 4 weeks  (around 09/11/2017) for Close Follow Up, w/ Dr. Harrington Challenger or PA.   Medication Adjustments/Labs and Tests Ordered: Current medicines are reviewed at length with the patient today.  Concerns regarding medicines are outlined above.  Tests Ordered: Orders Placed This Encounter  Procedures  . Basic Metabolic Panel (BMET)  . EKG 12-Lead   Medication Changes: Meds ordered this encounter  Medications  . clopidogrel (PLAVIX) 75 MG tablet    Sig: Take 1 tablet (75 mg total) by mouth daily.    Dispense:  90 tablet    Refill:  3  . atorvastatin (LIPITOR) 40 MG tablet    Sig: Take 1 tablet (40 mg total) by mouth daily.    Dispense:  90 tablet    Refill:  3  . isosorbide mononitrate (IMDUR) 60 MG 24 hr tablet    Sig: Take 1 tablet (60 mg total) by mouth daily.    Dispense:  90 tablet    Refill:  3  . metoprolol tartrate (LOPRESSOR) 25 MG tablet    Sig: Take 1 tablet (25 mg total) by mouth 2 (two) times daily.    Dispense:  180 tablet    Refill:  3    Signed, Richardson Dopp, PA-C  08/14/2017 1:27 PM    [facility] [address], [place], [place]  [address] Phone: [phone]; Fax: [phone]

## 2017-08-14 ENCOUNTER — Ambulatory Visit (INDEPENDENT_AMBULATORY_CARE_PROVIDER_SITE_OTHER): Payer: Medicare Other | Admitting: Physician Assistant

## 2017-08-14 ENCOUNTER — Encounter: Payer: Self-pay | Admitting: Physician Assistant

## 2017-08-14 ENCOUNTER — Encounter (INDEPENDENT_AMBULATORY_CARE_PROVIDER_SITE_OTHER): Payer: Self-pay

## 2017-08-14 VITALS — Ht 69.0 in | Wt 165.1 lb

## 2017-08-14 DIAGNOSIS — I2510 Atherosclerotic heart disease of native coronary artery without angina pectoris: Secondary | ICD-10-CM

## 2017-08-14 DIAGNOSIS — E785 Hyperlipidemia, unspecified: Secondary | ICD-10-CM

## 2017-08-14 DIAGNOSIS — R0681 Apnea, not elsewhere classified: Secondary | ICD-10-CM

## 2017-08-14 DIAGNOSIS — I208 Other forms of angina pectoris: Secondary | ICD-10-CM

## 2017-08-14 DIAGNOSIS — I951 Orthostatic hypotension: Secondary | ICD-10-CM | POA: Diagnosis not present

## 2017-08-14 DIAGNOSIS — I10 Essential (primary) hypertension: Secondary | ICD-10-CM

## 2017-08-14 DIAGNOSIS — N179 Acute kidney failure, unspecified: Secondary | ICD-10-CM

## 2017-08-14 LAB — BASIC METABOLIC PANEL
BUN / CREAT RATIO: 10 (ref 10–24)
BUN: 10 mg/dL (ref 8–27)
CHLORIDE: 103 mmol/L (ref 96–106)
CO2: 25 mmol/L (ref 20–29)
Calcium: 9 mg/dL (ref 8.6–10.2)
Creatinine, Ser: 0.99 mg/dL (ref 0.76–1.27)
GFR, EST AFRICAN AMERICAN: 81 mL/min/{1.73_m2} (ref >59)
GFR, EST NON AFRICAN AMERICAN: 70 mL/min/{1.73_m2} (ref >59)
Glucose: 92 mg/dL (ref 65–99)
POTASSIUM: 4.4 mmol/L (ref 3.5–5.2)
SODIUM: 143 mmol/L (ref 134–144)

## 2017-08-14 MED ORDER — METOPROLOL TARTRATE 25 MG PO TABS: 25.0000 mg | ORAL_TABLET | Freq: Two times a day (BID) | ORAL | 3 refills | Status: DC

## 2017-08-14 MED ORDER — ATORVASTATIN CALCIUM 40 MG PO TABS: 40.0000 mg | ORAL_TABLET | Freq: Every day | ORAL | 3 refills | Status: DC

## 2017-08-14 MED ORDER — ISOSORBIDE MONONITRATE ER 60 MG PO TB24: 60.0000 mg | ORAL_TABLET | Freq: Every day | ORAL | 3 refills | Status: DC

## 2017-08-14 MED ORDER — CLOPIDOGREL BISULFATE 75 MG PO TABS: 75.0000 mg | ORAL_TABLET | Freq: Every day | ORAL | 3 refills | Status: DC

## 2017-08-14 NOTE — Patient Instructions (Addendum)
Medication Instructions:  Continue current medications.   Labwork: Today - BMET   Testing/Procedures: None   Follow-Up: Appt with Vin Bhagat PAC same day Dr. Dorris Carnes in the office in 1 month Sep 14, 2017 @ 8:30 am.  Any Other Special Instructions Will Be Listed Below (If Applicable). Try to wear compression stockings every day to prevent your blood pressure from dropping when you stand up. This will also help leg swelling.   Also, try to stand up slowly.  "Pump" your calf muscles before standing. You can get compression stockings at any pharmacy. You can get compression stockings in [place] at [employer] # [phone].    If you need a refill on your cardiac medications before your next appointment, please call your pharmacy.

## 2017-08-15 ENCOUNTER — Telehealth: Payer: Self-pay | Admitting: Physician Assistant

## 2017-08-15 ENCOUNTER — Telehealth: Payer: Self-pay | Admitting: *Deleted

## 2017-08-15 NOTE — Telephone Encounter (Signed)
Son called back for the lab results.  He advised please leave a voice mail of results if possible.

## 2017-08-15 NOTE — Telephone Encounter (Signed)
-----   Message from Liliane Shi, [place] sent at 08/14/2017  9:30 PM EDT ----- Please call patient: The kidney function (BUN, Creatinine) and potassium are normal. All other parameters are within acceptable limits and no further intervention or testing required. Continue with current treatment plan. Richardson Dopp, PA-C    08/14/2017 9:30 PM

## 2017-08-15 NOTE — Telephone Encounter (Signed)
Pt's son [name] has been notified of pt's lab results by phone with verbal understanding. [name] thanked me for my call.

## 2017-08-15 NOTE — Telephone Encounter (Signed)
I s/w pt this morning who gave verbal permission ok to s/w his son [name]. I then Lmtcb for pt's son to go over lab results. I did not see a DPR on file. I have documented pt has given verbal permission.

## 2017-09-04 ENCOUNTER — Encounter: Payer: Self-pay | Admitting: Physician Assistant

## 2017-09-14 ENCOUNTER — Ambulatory Visit: Payer: Medicare Other | Admitting: Physician Assistant

## 2017-12-20 ENCOUNTER — Telehealth: Payer: Self-pay | Admitting: Internal Medicine

## 2017-12-20 NOTE — Telephone Encounter (Signed)
Pt's daughter Maryan Rued called about pt . Pt had a steroid injection in his back 3 months ago, Plavix 75 mg was stop 7 dais prior procedure. Pt forgot to start the medication after. Now pt has been  diagnosed with dementia. Daughter would like to know if pt can re-start  this medication. Daughter is aware that pt can start taking the Plavix today. Pt has an appointment with Richardson Dopp  PA on 3/12.

## 2017-12-20 NOTE — Telephone Encounter (Signed)
New message   Helene Kelp calling  patient was told to stop his medication due to steroid injection. Patient has now learn he has dementia and no one had inform patient to restart medication   Appt made with Richardson Dopp 3/12  Pt c/o medication issue:  1. Name of Medication: clopidogrel (PLAVIX) 75 MG tablet  2. How are you currently taking this medication (dosage and times per day)? Take 1 tablet (75 mg total) by mouth daily.  3. Are you having a reaction (difficulty breathing--STAT)? No   4. What is your medication issue? Talk with nurse on medication

## 2017-12-21 NOTE — Telephone Encounter (Signed)
Just stay on aspirin  Do not restart plavix

## 2017-12-21 NOTE — Telephone Encounter (Signed)
I spoke with patient's daughter, Ms. Arrwood Advised pt should continue aspirin 81 mg and not restart Plavix. She voices understanding and thanked me for calling.

## 2017-12-26 ENCOUNTER — Encounter: Payer: Self-pay | Admitting: Physician Assistant

## 2017-12-26 ENCOUNTER — Ambulatory Visit (INDEPENDENT_AMBULATORY_CARE_PROVIDER_SITE_OTHER): Payer: Medicare Other | Admitting: Physician Assistant

## 2017-12-26 VITALS — BP 132/58 | HR 62 | Ht 69.0 in | Wt 154.0 lb

## 2017-12-26 DIAGNOSIS — I951 Orthostatic hypotension: Secondary | ICD-10-CM | POA: Diagnosis not present

## 2017-12-26 DIAGNOSIS — E785 Hyperlipidemia, unspecified: Secondary | ICD-10-CM

## 2017-12-26 DIAGNOSIS — I10 Essential (primary) hypertension: Secondary | ICD-10-CM

## 2017-12-26 DIAGNOSIS — I25119 Atherosclerotic heart disease of native coronary artery with unspecified angina pectoris: Secondary | ICD-10-CM | POA: Diagnosis not present

## 2017-12-26 DIAGNOSIS — M7989 Other specified soft tissue disorders: Secondary | ICD-10-CM

## 2017-12-26 NOTE — Progress Notes (Signed)
Cardiology Office Note:    Date:  12/26/2017   ID:  [name], DOB 18-Apr-1931, MRN [id number]  PCP:  [name], FNP  Cardiologist:  Dorris Carnes, MD   Referring MD: [name], FNP   Chief Complaint  Patient presents with  . Follow-up    CAD    History of Present Illness:    [name] is a 84 y.o. male with coronary artery disease s/p stent to the RCA and cutting balloon angioplasty to the diagonal, chest pain, hypertension, hyperlipidemia, GERD.  Last Cardiac Catheterization in 2015 demonstrated patent RCA stent. There was 70-75% stenosis in the D1 that was not hemodynamically significant by FFR.  Admitted in 5/15 with GI bleed (possibly diverticular).  He also has a hx of dizziness related to orthostatic hypotension.  He was admitted in October 2018 with near syncope and acute kidney injury in the setting of a 40 point blood pressure drop from lying to standing.  Last seen in clinic 07/2017.  [name] returns for follow-up.  He is here today with his daughter.  Since last seen, he continues to note occasional episodes of chest discomfort.  He tells me he has had this for years.  He takes nitroglycerin with relief.  There has been no change in his pattern of chest discomfort.  He is fairly sedentary and gets weak quite often.  He denies orthopnea or PND.  He was taken off of furosemide when he was hospitalized last year for orthostatic hypotension.  His ankles have been more swollen since.  He denies any syncope.  He denies any recurrent falls.  He denies any bleeding issues.  Prior CV studies:   The following studies were reviewed today:  Echo 06/20/17 Mod conc LVH, EF 55-60, no RWMA, Gr 1 DD, mild LAE, trivial eff  Nuclear stress test 11/15/15 IMPRESSION: 1. No reversible ischemia or infarction. 2. Normal left ventricular wall motion. 3. Left ventricular ejection fraction 55% 4. Intermediate-risk stress test findings*.  Nuclear stress test  01/29/15 Poss small inferobasal infarct, no ischemia, EF 50, Low Risk  Cardiac Catheterization 01/13/14 LAD mid 40-50, ostial D1 70-75 (FFR normal) LCx irregs RCA mid stent patent with 30-40 ISR   Nuclear stress test 12/16/13 IMPRESSION: Moderate inferior wall infarct from apex to base with no ischemia EF 47%  Event monitor 10/14 NSR, isolated PVC  Past Medical History:  Diagnosis Date  . Arthritis    "hips, back" (06/17/2015)  . Asthma   . Chronic chest pain   . Chronic lower back pain   . Coronary artery disease    a. s/p BMS to RCA in 2002; b. s/p cutting balloon POBA ;   c. cath 6/12: oDx 80% (treated with repeat cutting balloon POBA), mLAD 50% with 30-40% at Dx, CFX 30%, pRCA 25% with patent stents;  d.  Lex MV 4/14:  Low Risk - EF 61%, inf scar with peri-infarct ischemia  . Dyspnea    chronic  . GERD (gastroesophageal reflux disease)    h/o esophageal spasm  . Headache   . History of blood transfusion    "related to OR"  . History of hiatal hernia   . Hyperlipidemia   . Hypertension   . Melanoma of lower back ([place]) late 1990's  . Memory loss   . Myocardial infarction ([place]) 2001   x 1, confirned 1 possible    Past Surgical History:  Procedure Laterality Date  . ANTERIOR CERVICAL DECOMP/DISCECTOMY FUSION    .  BACK SURGERY  multiple  . CARDIAC CATHETERIZATION     "I've had 17 caths" (06/17/2015)  . CATARACT EXTRACTION W/ INTRAOCULAR LENS  IMPLANT, BILATERAL Bilateral   . CERVICAL DISC SURGERY  multiple  . COLONOSCOPY WITH PROPOFOL N/A 04/17/2014   Procedure: COLONOSCOPY WITH PROPOFOL;  Surgeon: Winfield Cunas., MD;  Location: [facility] ENDOSCOPY;  Service: Endoscopy;  Laterality: N/A;  . CORONARY ANGIOPLASTY WITH STENT PLACEMENT  x 2 stents    previous percutaneous intervention on the  RCA and the diagonal branch  . ESOPHAGOGASTRODUODENOSCOPY  03/23/2012   Procedure: ESOPHAGOGASTRODUODENOSCOPY (EGD);  Surgeon: Winfield Cunas., MD;  Location: [facility]OS[facility];  Service:  Endoscopy;  Laterality: N/A;  . ESOPHAGOGASTRODUODENOSCOPY N/A 03/04/2014   Procedure: ESOPHAGOGASTRODUODENOSCOPY (EGD);  Surgeon: Arta Silence, MD;  Location: [facility] ENDOSCOPY;  Service: Endoscopy;  Laterality: N/A;  . LAMINECTOMY    . LEFT HEART CATHETERIZATION WITH CORONARY ANGIOGRAM N/A 01/13/2014   Procedure: LEFT HEART CATHETERIZATION WITH CORONARY ANGIOGRAM;  Surgeon: Blane Ohara, MD;  Location: [facility] CATH LAB;  Service: Cardiovascular;  Laterality: N/A;  . MELANOMA EXCISION  late 1990's   "lower back"  . POSTERIOR LAMINECTOMY / DECOMPRESSION CERVICAL SPINE    . SAVORY DILATION  03/23/2012   Procedure: SAVORY DILATION;  Surgeon: Winfield Cunas., MD;  Location: [facility] ENDOSCOPY;  Service: Endoscopy;  Laterality: N/A;  . TONSILLECTOMY  1930's    Current Medications: Current Meds  Medication Sig  . acetaminophen (TYLENOL) 325 MG tablet Take 2 tablets (650 mg total) by mouth every 4 (four) hours as needed for headache or mild pain.  Marland Kitchen aspirin EC 81 MG tablet Take 1 tablet (81 mg total) by mouth daily.  Marland Kitchen atorvastatin (LIPITOR) 40 MG tablet Take 1 tablet (40 mg total) by mouth daily.  . cyclobenzaprine (FLEXERIL) 5 MG tablet Take 7.5 mg by mouth 3 (three) times daily as needed for muscle spasms.   Marland Kitchen doxazosin (CARDURA) 1 MG tablet TAKE 1 TABLET BY MOUTH ONCE DAILY.  . feeding supplement, ENSURE ENLIVE, (ENSURE ENLIVE) LIQD Take 237 mLs by mouth 2 (two) times daily between meals.  Marland Kitchen HYDROcodone-acetaminophen (NORCO) 7.5-325 MG per tablet Take 1 tablet by mouth every 4 (four) hours as needed for moderate pain. FOR PAIN  . isosorbide mononitrate (IMDUR) 60 MG 24 hr tablet Take 1 tablet (60 mg total) by mouth daily.  . metoprolol tartrate (LOPRESSOR) 25 MG tablet Take 1 tablet (25 mg total) by mouth 2 (two) times daily.  . nitroGLYCERIN (NITROSTAT) 0.4 MG SL tablet Place 1 tablet (0.4 mg total) under the tongue every 5 (five) minutes as needed for chest pain.  . pantoprazole (PROTONIX) 40 MG  tablet TAKE 1 TABLET BY MOUTH TWICE DAILY  . tamsulosin (FLOMAX) 0.4 MG CAPS capsule Take 0.4 mg by mouth daily.   . vitamin B-12 1000 MCG tablet Take 1 tablet (1,000 mcg total) by mouth daily.     Allergies:   Budesonide-formoterol fumarate; Iohexol; Simvastatin; Demerol [meperidine]; Ivp dye [iodinated diagnostic agents]; Meperidine hcl; Tape; Naproxen; Pregabalin; and Sulfonamide derivatives   Social History   Tobacco Use  . Smoking status: Never Smoker  . Smokeless tobacco: Never Used  Substance Use Topics  . Alcohol use: No  . Drug use: No     Family Hx: The patient's family history includes Asthma in his father; Colitis in his son; Colon cancer in his son; Crohn's disease in his son; Diabetes in his father; Heart disease in his father and mother; Prostate cancer  in his paternal grandfather.  ROS:   Please see the history of present illness.    Review of Systems  Constitution: Positive for decreased appetite.  Cardiovascular: Positive for leg swelling.  Hematologic/Lymphatic: Bruises/bleeds easily.  Skin: Positive for rash.  Gastrointestinal: Positive for constipation and diarrhea.  Neurological: Positive for loss of balance.   All other systems reviewed and are negative.   EKGs/Labs/Other Test Reviewed:    EKG:  EKG is  ordered today.  The ekg ordered today demonstrates normal sinus rhythm, heart rate 62, normal axis, QTC 432 ms, no significant change  Recent Labs: 07/31/2017: ALT 21; Hemoglobin 12.4; Platelets 236; TSH 1.594 08/14/2017: BUN 10; Creatinine, Ser 0.99; Potassium 4.4; Sodium 143   Recent Lipid Panel Lab Results  Component Value Date/Time   CHOL 123 01/19/2015 01:15 PM   TRIG 71.0 01/19/2015 01:15 PM   HDL 45.60 01/19/2015 01:15 PM   CHOLHDL 3 01/19/2015 01:15 PM   LDLCALC 63 01/19/2015 01:15 PM    Physical Exam:    VS:  BP (!) 132/58   Pulse 62   Ht '5\' 9"'$  (1.753 m)   Wt 154 lb (69.9 kg)   SpO2 98%   BMI 22.74 kg/m     Wt Readings from  Last 3 Encounters:  12/26/17 154 lb (69.9 kg)  08/14/17 165 lb 1.9 oz (74.9 kg)  08/02/17 154 lb (69.9 kg)     Physical Exam  Constitutional: He is oriented to person, place, and time. He appears well-developed and well-nourished. No distress.  HENT:  Head: Normocephalic and atraumatic.  Neck: Neck supple.  Cardiovascular: Normal rate and regular rhythm.  No murmur heard. Pulmonary/Chest: Effort normal. He has no rales.  Abdominal: Soft.  Musculoskeletal: He exhibits edema (trace ankle edema bilat).  Neurological: He is alert and oriented to person, place, and time.  Skin: Skin is warm and dry.    ASSESSMENT & PLAN:    #1.  Coronary artery disease with angina pectoris ([place]) Remote history of stenting to the RCA and angioplasty of the diagonal.  Cardiac catheterization 2015 with patent RCA stent and negative FFR in the diagonal.  Last stress test in 2017 neg for ischemia or infarction.  He has chronic anginal symptoms.  These are relieved with nitroglycerin.  He has not had any change in his overall pattern.  -Continue aspirin, atorvastatin, beta-blocker, nitrates  -Handicap placard application completed today  #2.  Essential hypertension   The patient's blood pressure is controlled on his current regimen.  Continue current therapy.   #3.  Orthostatic hypotension Currently stable from a symptomatic standpoint.  He continues to have some dizziness with standing.  He denies any falls or syncope.  I have encouraged him to use compression stockings.  #4.  Hyperlipidemia, unspecified hyperlipidemia type Continue statin.  Obtain follow-up lipids, CMET today.  #5.  Leg swelling He was previously on Lasix to help with his swelling.  This was stopped when he was admitted for orthostatic hypotension last year.  I have asked him to use compression stockings.  If his swelling worsens, we could consider giving him Lasix to use on an as-needed basis only.   Dispo:  Return in about 6 months  (around 06/28/2018) for Routine Follow Up, w/ Dr. Harrington Challenger, or Richardson Dopp, PA-C.   Medication Adjustments/Labs and Tests Ordered: Current medicines are reviewed at length with the patient today.  Concerns regarding medicines are outlined above.  Tests Ordered: Orders Placed This Encounter  Procedures  . Lipid Profile  .  Comp Met (CMET)  . EKG 12-Lead   Medication Changes: No orders of the defined types were placed in this encounter.   Signed, Richardson Dopp, PA-C  12/26/2017 1:36 PM    [facility] [place], [place],   [address] Phone: [phone]; Fax: [phone]

## 2017-12-26 NOTE — Patient Instructions (Addendum)
Medication Instructions:  No changes  Labwork: Today - CMET, Lipid panel  Testing/Procedures: None   Follow-Up: Your physician wants you to follow-up in: [place] DR. ROSS OR Beniah Magnan, PAC  You will receive a reminder letter in the mail two months in advance. If you don't receive a letter, please call our office to schedule the follow-up appointment.   Any Other Special Instructions Will Be Listed Below (If Applicable). Wear compression socks every day to keep your blood pressure from dropping when you stand and to keep your swelling down. Call if your swelling gets worse.  If you need a refill on your cardiac medications before your next appointment, please call your pharmacy.

## 2017-12-27 ENCOUNTER — Telehealth: Payer: Self-pay | Admitting: *Deleted

## 2017-12-27 DIAGNOSIS — E785 Hyperlipidemia, unspecified: Secondary | ICD-10-CM

## 2017-12-27 LAB — LIPID PANEL
Chol/HDL Ratio: 4.5 ratio (ref 0.0–5.0)
Cholesterol, Total: 192 mg/dL (ref 100–199)
HDL: 43 mg/dL
LDL Calculated: 130 mg/dL — ABNORMAL HIGH (ref 0–99)
Triglycerides: 93 mg/dL (ref 0–149)
VLDL Cholesterol Cal: 19 mg/dL (ref 5–40)

## 2017-12-27 LAB — COMPREHENSIVE METABOLIC PANEL WITH GFR
ALT: 14 IU/L (ref 0–44)
AST: 17 IU/L (ref 0–40)
Albumin/Globulin Ratio: 1.3 (ref 1.2–2.2)
Albumin: 3.7 g/dL (ref 3.5–4.7)
Alkaline Phosphatase: 109 IU/L (ref 39–117)
BUN/Creatinine Ratio: 13 (ref 10–24)
BUN: 13 mg/dL (ref 8–27)
Bilirubin Total: 0.4 mg/dL (ref 0.0–1.2)
CO2: 25 mmol/L (ref 20–29)
Calcium: 9.6 mg/dL (ref 8.6–10.2)
Chloride: 102 mmol/L (ref 96–106)
Creatinine, Ser: 1.04 mg/dL (ref 0.76–1.27)
GFR calc Af Amer: 76 mL/min/1.73
GFR calc non Af Amer: 66 mL/min/1.73
Globulin, Total: 2.8 g/dL (ref 1.5–4.5)
Glucose: 126 mg/dL — ABNORMAL HIGH (ref 65–99)
Potassium: 3.9 mmol/L (ref 3.5–5.2)
Sodium: 141 mmol/L (ref 134–144)
Total Protein: 6.5 g/dL (ref 6.0–8.5)

## 2017-12-27 MED ORDER — ATORVASTATIN CALCIUM 80 MG PO TABS: 80.0000 mg | ORAL_TABLET | Freq: Every day | ORAL | 3 refills | Status: DC

## 2017-12-27 NOTE — Telephone Encounter (Signed)
-----   Message from Liliane Shi, [place] sent at 12/27/2017  5:08 PM EDT ----- Renal function, potassium, LFTs normal. LDL above target.  Goal is <70. PLAN:  Increase Lipitor to 80 mg daily Repeat lipids and LFTs in 3 months. Richardson Dopp, PA-C    12/27/2017 5:07 PM

## 2017-12-27 NOTE — Telephone Encounter (Signed)
DPR fpr pt's daughter Helene Kelp. Daughter has been notified of lab results for the pt as well as the increased dose of Lipitor to 80 mg daily, new Rx has been sent in. Lipid and Liver panel to be done 04/09/18. Daughter aware this will be fasting lab work. Pt's daughter thanked me for my call today.

## 2018-01-20 ENCOUNTER — Inpatient Hospital Stay
Admission: EM | Admit: 2018-01-20 | Discharge: 2018-01-26 | DRG: 308 | Disposition: A | Discharge status: Skilled Nursing Facility | Payer: Medicare Other | Attending: Internal Medicine | Admitting: Internal Medicine

## 2018-01-20 ENCOUNTER — Other Ambulatory Visit: Payer: Self-pay

## 2018-01-20 ENCOUNTER — Encounter: Payer: Self-pay | Admitting: Emergency Medicine

## 2018-01-20 ENCOUNTER — Emergency Department: Payer: Medicare Other

## 2018-01-20 DIAGNOSIS — R05 Cough: Secondary | ICD-10-CM

## 2018-01-20 DIAGNOSIS — R059 Cough, unspecified: Secondary | ICD-10-CM

## 2018-01-20 DIAGNOSIS — R778 Other specified abnormalities of plasma proteins: Secondary | ICD-10-CM

## 2018-01-20 DIAGNOSIS — I480 Paroxysmal atrial fibrillation: Secondary | ICD-10-CM | POA: Diagnosis not present

## 2018-01-20 DIAGNOSIS — D72829 Elevated white blood cell count, unspecified: Secondary | ICD-10-CM | POA: Diagnosis present

## 2018-01-20 DIAGNOSIS — R55 Syncope and collapse: Secondary | ICD-10-CM

## 2018-01-20 DIAGNOSIS — L89154 Pressure ulcer of sacral region, stage 4: Secondary | ICD-10-CM

## 2018-01-20 DIAGNOSIS — G9341 Metabolic encephalopathy: Secondary | ICD-10-CM | POA: Diagnosis present

## 2018-01-20 DIAGNOSIS — Z825 Family history of asthma and other chronic lower respiratory diseases: Secondary | ICD-10-CM

## 2018-01-20 DIAGNOSIS — N400 Enlarged prostate without lower urinary tract symptoms: Secondary | ICD-10-CM | POA: Diagnosis present

## 2018-01-20 DIAGNOSIS — L89109 Pressure ulcer of unspecified part of back, unspecified stage: Secondary | ICD-10-CM | POA: Diagnosis present

## 2018-01-20 DIAGNOSIS — R7881 Bacteremia: Secondary | ICD-10-CM | POA: Diagnosis present

## 2018-01-20 DIAGNOSIS — D649 Anemia, unspecified: Secondary | ICD-10-CM | POA: Diagnosis present

## 2018-01-20 DIAGNOSIS — Z981 Arthrodesis status: Secondary | ICD-10-CM

## 2018-01-20 DIAGNOSIS — I10 Essential (primary) hypertension: Secondary | ICD-10-CM | POA: Diagnosis present

## 2018-01-20 DIAGNOSIS — R4189 Other symptoms and signs involving cognitive functions and awareness: Secondary | ICD-10-CM | POA: Diagnosis present

## 2018-01-20 DIAGNOSIS — Z85820 Personal history of malignant melanoma of skin: Secondary | ICD-10-CM

## 2018-01-20 DIAGNOSIS — J45909 Unspecified asthma, uncomplicated: Secondary | ICD-10-CM | POA: Diagnosis present

## 2018-01-20 DIAGNOSIS — F0391 Unspecified dementia with behavioral disturbance: Secondary | ICD-10-CM

## 2018-01-20 DIAGNOSIS — L89159 Pressure ulcer of sacral region, unspecified stage: Secondary | ICD-10-CM | POA: Diagnosis present

## 2018-01-20 DIAGNOSIS — I493 Ventricular premature depolarization: Secondary | ICD-10-CM | POA: Diagnosis present

## 2018-01-20 DIAGNOSIS — E440 Moderate protein-calorie malnutrition: Secondary | ICD-10-CM

## 2018-01-20 DIAGNOSIS — I4891 Unspecified atrial fibrillation: Secondary | ICD-10-CM | POA: Diagnosis present

## 2018-01-20 DIAGNOSIS — T551X1A Toxic effect of detergents, accidental (unintentional), initial encounter: Secondary | ICD-10-CM | POA: Diagnosis present

## 2018-01-20 DIAGNOSIS — W1830XA Fall on same level, unspecified, initial encounter: Secondary | ICD-10-CM | POA: Diagnosis present

## 2018-01-20 DIAGNOSIS — K219 Gastro-esophageal reflux disease without esophagitis: Secondary | ICD-10-CM | POA: Diagnosis present

## 2018-01-20 DIAGNOSIS — Y92009 Unspecified place in unspecified non-institutional (private) residence as the place of occurrence of the external cause: Secondary | ICD-10-CM

## 2018-01-20 DIAGNOSIS — Z6821 Body mass index (BMI) 21.0-21.9, adult: Secondary | ICD-10-CM

## 2018-01-20 DIAGNOSIS — R739 Hyperglycemia, unspecified: Secondary | ICD-10-CM | POA: Diagnosis present

## 2018-01-20 DIAGNOSIS — Z7982 Long term (current) use of aspirin: Secondary | ICD-10-CM

## 2018-01-20 DIAGNOSIS — I2510 Atherosclerotic heart disease of native coronary artery without angina pectoris: Secondary | ICD-10-CM | POA: Diagnosis present

## 2018-01-20 DIAGNOSIS — Z955 Presence of coronary angioplasty implant and graft: Secondary | ICD-10-CM

## 2018-01-20 DIAGNOSIS — I248 Other forms of acute ischemic heart disease: Secondary | ICD-10-CM | POA: Diagnosis present

## 2018-01-20 DIAGNOSIS — Z8249 Family history of ischemic heart disease and other diseases of the circulatory system: Secondary | ICD-10-CM

## 2018-01-20 DIAGNOSIS — I252 Old myocardial infarction: Secondary | ICD-10-CM

## 2018-01-20 DIAGNOSIS — M6282 Rhabdomyolysis: Secondary | ICD-10-CM | POA: Diagnosis present

## 2018-01-20 DIAGNOSIS — L240 Irritant contact dermatitis due to detergents: Secondary | ICD-10-CM | POA: Diagnosis present

## 2018-01-20 DIAGNOSIS — F0390 Unspecified dementia without behavioral disturbance: Secondary | ICD-10-CM | POA: Diagnosis present

## 2018-01-20 DIAGNOSIS — W19XXXA Unspecified fall, initial encounter: Secondary | ICD-10-CM

## 2018-01-20 DIAGNOSIS — R32 Unspecified urinary incontinence: Secondary | ICD-10-CM | POA: Diagnosis present

## 2018-01-20 DIAGNOSIS — R404 Transient alteration of awareness: Secondary | ICD-10-CM | POA: Diagnosis present

## 2018-01-20 DIAGNOSIS — R509 Fever, unspecified: Secondary | ICD-10-CM

## 2018-01-20 DIAGNOSIS — L909 Atrophic disorder of skin, unspecified: Secondary | ICD-10-CM

## 2018-01-20 DIAGNOSIS — F0281 Dementia in other diseases classified elsewhere with behavioral disturbance: Secondary | ICD-10-CM

## 2018-01-20 DIAGNOSIS — B965 Pseudomonas (aeruginosa) (mallei) (pseudomallei) as the cause of diseases classified elsewhere: Secondary | ICD-10-CM | POA: Diagnosis present

## 2018-01-20 DIAGNOSIS — R7989 Other specified abnormal findings of blood chemistry: Secondary | ICD-10-CM | POA: Diagnosis present

## 2018-01-20 DIAGNOSIS — R238 Other skin changes: Secondary | ICD-10-CM

## 2018-01-20 DIAGNOSIS — E785 Hyperlipidemia, unspecified: Secondary | ICD-10-CM | POA: Diagnosis present

## 2018-01-20 DIAGNOSIS — Z66 Do not resuscitate: Secondary | ICD-10-CM | POA: Diagnosis present

## 2018-01-20 DIAGNOSIS — G20 Parkinson's disease: Secondary | ICD-10-CM

## 2018-01-20 DIAGNOSIS — Z79899 Other long term (current) drug therapy: Secondary | ICD-10-CM

## 2018-01-20 LAB — CBC WITH DIFFERENTIAL/PLATELET
BASOS PCT: 0 %
Basophils Absolute: 0 10*3/uL (ref 0–0.1)
EOS PCT: 0 %
Eosinophils Absolute: 0 10*3/uL (ref 0–0.7)
HEMATOCRIT: 41.3 % (ref 40.0–52.0)
Hemoglobin: 13.6 g/dL (ref 13.0–18.0)
LYMPHS ABS: 0.5 10*3/uL — AB (ref 1.0–3.6)
Lymphocytes Relative: 3 %
MCH: 29.5 pg (ref 26.0–34.0)
MCHC: 32.8 g/dL (ref 32.0–36.0)
MCV: 89.7 fL (ref 80.0–100.0)
MONO ABS: 2.4 10*3/uL — AB (ref 0.2–1.0)
Monocytes Relative: 14 %
NEUTROS ABS: 14 10*3/uL — AB (ref 1.4–6.5)
Neutrophils Relative %: 83 %
Platelets: 258 10*3/uL (ref 150–440)
RBC: 4.61 MIL/uL (ref 4.40–5.90)
RDW: 13.9 % (ref 11.5–14.5)
Smear Review: ADEQUATE
WBC: 16.9 10*3/uL — ABNORMAL HIGH (ref 3.8–10.6)

## 2018-01-20 LAB — COMPREHENSIVE METABOLIC PANEL
ALT: 36 U/L (ref 17–63)
ANION GAP: 10 (ref 5–15)
AST: 45 U/L — AB (ref 15–41)
Albumin: 3 g/dL — ABNORMAL LOW (ref 3.5–5.0)
Alkaline Phosphatase: 76 U/L (ref 38–126)
BILIRUBIN TOTAL: 1.7 mg/dL — AB (ref 0.3–1.2)
BUN: 40 mg/dL — AB (ref 6–20)
CO2: 26 mmol/L (ref 22–32)
Calcium: 8.4 mg/dL — ABNORMAL LOW (ref 8.9–10.3)
Chloride: 101 mmol/L (ref 101–111)
Creatinine, Ser: 0.95 mg/dL (ref 0.61–1.24)
Glucose, Bld: 171 mg/dL — ABNORMAL HIGH (ref 65–99)
POTASSIUM: 3.7 mmol/L (ref 3.5–5.1)
Sodium: 137 mmol/L (ref 135–145)
TOTAL PROTEIN: 6.5 g/dL (ref 6.5–8.1)

## 2018-01-20 LAB — CK: CK TOTAL: 467 U/L — AB (ref 49–397)

## 2018-01-20 LAB — TROPONIN I: Troponin I: 0.25 ng/mL (ref <0.03)

## 2018-01-20 MED ORDER — SODIUM CHLORIDE 0.9 % IV BOLUS
1000.0000 mL | Freq: Once | INTRAVENOUS | Status: AC
  Administered 2018-01-20: 1000 mL via INTRAVENOUS

## 2018-01-20 MED ORDER — ASPIRIN 81 MG PO CHEW
324.0000 mg | CHEWABLE_TABLET | Freq: Once | ORAL | Status: AC
  Administered 2018-01-20: 324 mg via ORAL
  Filled 2018-01-20: qty 4

## 2018-01-20 NOTE — ED Provider Notes (Signed)
[facility] Emergency Department Provider Note   ____________________________________________   I have reviewed the triage vital signs and the nursing notes.   HISTORY  Chief Complaint Fall   History limited by and level 5 caveat due to: Dementia   HPI [name] is a 84 y.o. male who presents to the emergency department today after being found down at his residence.  Per family he was last seen normal 5 days ago and was last heard 4 days ago.  It is unclear how long the patient has been down on the ground.  Family states that when he found him he was in the kitchen with dish detergent and urine on the floor around him.  They state that normally he would have a difficult time getting up off the floor if he had fallen.  They were not aware that the patient been feeling sick at all recently.   Per medical record review patient has a history of CAD, memory loss..  Past Medical History:  Diagnosis Date  . Arthritis    "hips, back" (06/17/2015)  . Asthma   . Chronic chest pain   . Chronic lower back pain   . Coronary artery disease    a. s/p BMS to RCA in 2002; b. s/p cutting balloon POBA ;   c. cath 6/12: oDx 80% (treated with repeat cutting balloon POBA), mLAD 50% with 30-40% at Dx, CFX 30%, pRCA 25% with patent stents;  d.  Lex MV 4/14:  Low Risk - EF 61%, inf scar with peri-infarct ischemia  . Dyspnea    chronic  . GERD (gastroesophageal reflux disease)    h/o esophageal spasm  . Headache   . History of blood transfusion    "related to OR"  . History of hiatal hernia   . Hyperlipidemia   . Hypertension   . Melanoma of lower back ([place]) late 1990's  . Memory loss   . Myocardial infarction ([place]) 2001   x 1, confirned 1 possible    Patient Active Problem List   Diagnosis Date Noted  . Pre-syncope 07/31/2017  . B12 deficiency 06/19/2017  . Acute encephalopathy 06/18/2017  . Essential hypertension   . Bladder outlet obstruction   . GI bleed  03/03/2014  . Anemia 03/03/2014  . Generalized weakness 03/03/2014  . ESOPHAGEAL STRICTURE 09/20/2010  . ORTHOSTATIC DIZZINESS 08/30/2010  . IBS 08/26/2010  . RECTAL BLEEDING 08/26/2010  . RECTAL PAIN 08/26/2010  . ARTHRITIS 08/26/2010  . LOSS OF APPETITE 08/26/2010  . OTHER DYSPHAGIA 08/26/2010  . Abdominal pain, generalized 08/26/2010  . COLONIC POLYPS, HX OF 08/26/2010  . ANAL FISSURE, HX OF 08/26/2010  . PALPITATIONS, HX OF 08/18/2010  . PERIPHERAL NEUROPATHY 05/01/2009  . History of myocardial infarction 05/01/2009  . ALLERGIC RHINITIS, SEASONAL 05/01/2009  . DEPRESSION, HX OF 05/01/2009  . Personal history of other diseases of digestive system 05/01/2009  . NEPHROLITHIASIS, HX OF 05/01/2009  . BENIGN PROSTATIC HYPERTROPHY, HX OF 05/01/2009  . LAMINECTOMY, HX OF 05/01/2009  . Hyperlipidemia 10/20/2008  . CAD (coronary artery disease) 10/20/2008  . ASTHMA, UNSPECIFIED, UNSPECIFIED STATUS 10/20/2008  . GERD 10/20/2008    Past Surgical History:  Procedure Laterality Date  . ANTERIOR CERVICAL DECOMP/DISCECTOMY FUSION    . BACK SURGERY  multiple  . CARDIAC CATHETERIZATION     "I've had 17 caths" (06/17/2015)  . CATARACT EXTRACTION W/ INTRAOCULAR LENS  IMPLANT, BILATERAL Bilateral   . CERVICAL DISC SURGERY  multiple  . COLONOSCOPY WITH PROPOFOL  N/A 04/17/2014   Procedure: COLONOSCOPY WITH PROPOFOL;  Surgeon: Winfield Cunas., MD;  Location: [facility] ENDOSCOPY;  Service: Endoscopy;  Laterality: N/A;  . CORONARY ANGIOPLASTY WITH STENT PLACEMENT  x 2 stents    previous percutaneous intervention on the  RCA and the diagonal branch  . ESOPHAGOGASTRODUODENOSCOPY  03/23/2012   Procedure: ESOPHAGOGASTRODUODENOSCOPY (EGD);  Surgeon: Winfield Cunas., MD;  Location: [facility] ENDOSCOPY;  Service: Endoscopy;  Laterality: N/A;  . ESOPHAGOGASTRODUODENOSCOPY N/A 03/04/2014   Procedure: ESOPHAGOGASTRODUODENOSCOPY (EGD);  Surgeon: Arta Silence, MD;  Location: [facility] ENDOSCOPY;  Service: Endoscopy;   Laterality: N/A;  . LAMINECTOMY    . LEFT HEART CATHETERIZATION WITH CORONARY ANGIOGRAM N/A 01/13/2014   Procedure: LEFT HEART CATHETERIZATION WITH CORONARY ANGIOGRAM;  Surgeon: Blane Ohara, MD;  Location[facility] C[facility] LAB;  Service: Cardiovascular;  Laterality: N/A;  . MELANOMA EXCISION  late 1990's   "lower back"  . POSTERIOR LAMINECTOMY / DECOMPRESSION CERVICAL SPINE    . SAVORY DILATION  03/23/2012   Procedure: SAVORY DILATION;  Surgeon: Winfield Cunas., MD;  Location: [facility] ENDOSCOPY;  Service: Endoscopy;  Laterality: N/A;  . TONSILLECTOMY  1930's    Prior to Admission medications   Medication Sig Start Date End Date Taking? Authorizing Provider  acetaminophen (TYLENOL) 325 MG tablet Take 2 tablets (650 mg total) by mouth every 4 (four) hours as needed for headache or mild pain. 12/16/13   Erlene Quan, PA-C  aspirin EC 81 MG tablet Take 1 tablet (81 mg total) by mouth daily. 03/08/14   Mikhail, Velta Addison, DO  atorvastatin (LIPITOR) 80 MG tablet Take 1 tablet (80 mg total) by mouth daily. 12/27/17 03/27/18  Richardson Dopp T, PA-C  cyclobenzaprine (FLEXERIL) 5 MG tablet Take 7.5 mg by mouth 3 (three) times daily as needed for muscle spasms.     [provider]  doxazosin (CARDURA) 1 MG tablet TAKE 1 TABLET BY MOUTH ONCE DAILY. 06/01/17   Fay Records, MD  feeding supplement, ENSURE ENLIVE, (ENSURE ENLIVE) LIQD Take 237 mLs by mouth 2 (two) times daily between meals. 08/02/17   Fritzi Mandes, MD  HYDROcodone-acetaminophen ([place]) 7.5-325 MG per tablet Take 1 tablet by mouth every 4 (four) hours as needed for moderate pain. FOR PAIN 06/05/14   [provider]  isosorbide mononitrate (IMDUR) 60 MG 24 hr tablet Take 1 tablet (60 mg total) by mouth daily. 08/14/17   Richardson Dopp T, PA-C  metoprolol tartrate (LOPRESSOR) 25 MG tablet Take 1 tablet (25 mg total) by mouth 2 (two) times daily. 08/14/17   Richardson Dopp T, PA-C  nitroGLYCERIN (NITROSTAT) 0.4 MG SL tablet Place 1 tablet (0.4 mg  total) under the tongue every 5 (five) minutes as needed for chest pain. 09/15/16   Fay Records, MD  pantoprazole (PROTONIX) 40 MG tablet TAKE 1 TABLET BY MOUTH TWICE DAILY 06/01/17   Fay Records, MD  tamsulosin (FLOMAX) 0.4 MG CAPS capsule Take 0.4 mg by mouth daily.  01/13/15   [provider]  vitamin B-12 1000 MCG tablet Take 1 tablet (1,000 mcg total) by mouth daily. 06/21/17   Geradine Girt, DO    Allergies Budesonide-formoterol fumarate; Iohexol; Simvastatin; Demerol [meperidine]; Ivp dye [iodinated diagnostic agents]; Meperidine hcl; Tape; Naproxen; Pregabalin; and Sulfonamide derivatives  Family History  Problem Relation Age of Onset  . Diabetes Father   . Heart disease Father   . Asthma Father   . Heart disease Mother        CABG hx  age 31  . Colon cancer Son        hx   . Colitis Son        hx  . Crohn's disease Son   . Prostate cancer Paternal Grandfather     Social History Social History   Tobacco Use  . Smoking status: Never Smoker  . Smokeless tobacco: Never Used  Substance Use Topics  . Alcohol use: No  . Drug use: No    Review of Systems Unable to obtain secondary to dementia.   ____________________________________________   PHYSICAL EXAM:  VITAL SIGNS: ED Triage Vitals [01/20/18 2033]  Enc Vitals Group     BP (!) 184/100     Pulse Rate 88     Resp 20     Temp      Temp src      SpO2 98 %   Constitutional: Awake, alert, not oriented. Eyes: Conjunctivae are normal.  ENT   Head: Normocephalic and atraumatic.   Nose: No congestion/rhinnorhea.   Mouth/Throat: Mucous membranes are moist.   Neck: No stridor. In c-collar. Hematological/Lymphatic/Immunilogical: No cervical lymphadenopathy. Cardiovascular: Normal rate, irregularly irregular rhythm.  No murmurs, rubs, or gallops.  Respiratory: Normal respiratory effort without tachypnea nor retractions. Breath sounds are clear and equal bilaterally. No  wheezes/rales/rhonchi. Gastrointestinal: Soft and non tender. No rebound. No guarding.  Genitourinary: Deferred Musculoskeletal: Normal range of motion in all extremities.  Neurologic:  Demented. Moves all extremities. Follows commands. Sensation grossly intact.  Skin:  Large area over the right shoulder with skin breakdown and erythema. Smaller area over sacrum.  ____________________________________________    LABS (pertinent positives/negatives)  CBC wbc 16.9, hgb 13.6, plt 258 Trop 0.25 CK 467 CMP na 137, k 3.7, glu 171, cr 0.95  ____________________________________________   EKG  I, Nance Pear, attending physician, personally viewed and interpreted this EKG  EKG Time: 2033 Rate: 96 Rhythm: atrial fibrillation with pvc Axis: normal Intervals: qtc 460 QRS: narrow ST changes: no st elevation Impression: abnormal ekg   ____________________________________________    RADIOLOGY  CT head/cervical spine No acute findings  ____________________________________________   PROCEDURES  Procedures  CRITICAL CARE Performed by: Nance Pear   Total critical care time: 40 minutes  Critical care time was exclusive of separately billable procedures and treating other patients.  Critical care was necessary to treat or prevent imminent or life-threatening deterioration.  Critical care was time spent personally by me on the following activities: development of treatment plan with patient and/or surrogate as well as nursing, discussions with consultants, evaluation of patient's response to treatment, examination of patient, obtaining history from patient or surrogate, ordering and performing treatments and interventions, ordering and review of laboratory studies, ordering and review of radiographic studies, pulse oximetry and re-evaluation of patient's condition.  ____________________________________________   INITIAL IMPRESSION / ASSESSMENT AND PLAN / ED  COURSE  Pertinent labs & imaging results that were available during my care of the patient were reviewed by me and considered in my medical decision making (see chart for details).  Patient presented to the emergency department today via EMS after being found down by family.  Patient does have a history cannot state exactly why he fell however it has been a number of days since family saw him up.  It is unclear the etiology of the patient's fall on initial exam.  No obvious traumatic injuries although patient does have significant skin breakdown on the back likely from lying in discharge and urine.  Differential would be broadest  terms of the etiology including stroke, heart attack, anemia, infection, mechanical fall which the patient was not able to get up from.  Work-up does show an elevated troponin and EKG is concerning for atrial fibrillation.  Family states patient does not have a history of atrial fibrillation.  No obvious signs of infection.  CT head was negative.  Patient will be admitted for further work-up and evaluation.  Discussed findings and plan with patient and family.  ____________________________________________   FINAL CLINICAL IMPRESSION(S) / ED DIAGNOSES  Final diagnoses:  Fall, initial encounter  Elevated troponin  Skin breakdown     Note: This dictation was prepared with Dragon dictation. Any transcriptional errors that result from this process are unintentional     Nance Pear, MD 01/21/18 1606

## 2018-01-20 NOTE — ED Triage Notes (Signed)
Pt to rm 11 via EMS from home. EMS reports pt was found on floor by family, had been down for unknown amount of time.  Pt was last seen by family on Tuesday.  Pt w/ hx of dementia.  Pt given 1L NS en route.  Pt found soiled with dried urine and feces.  Pt found in kitchen with dish soap and/or drain-o around him, noted to be in hair.  Pt in NAD upon arrival.  No obvious injuries seen.  c-collar in place by EMS as precaution.

## 2018-01-21 ENCOUNTER — Other Ambulatory Visit: Payer: Self-pay

## 2018-01-21 ENCOUNTER — Emergency Department: Payer: Medicare Other

## 2018-01-21 ENCOUNTER — Inpatient Hospital Stay: Payer: Medicare Other

## 2018-01-21 ENCOUNTER — Observation Stay: Admit: 2018-01-21 | Payer: Medicare Other

## 2018-01-21 DIAGNOSIS — D649 Anemia, unspecified: Secondary | ICD-10-CM | POA: Diagnosis present

## 2018-01-21 DIAGNOSIS — Z85820 Personal history of malignant melanoma of skin: Secondary | ICD-10-CM | POA: Diagnosis not present

## 2018-01-21 DIAGNOSIS — Z8249 Family history of ischemic heart disease and other diseases of the circulatory system: Secondary | ICD-10-CM | POA: Diagnosis not present

## 2018-01-21 DIAGNOSIS — G9341 Metabolic encephalopathy: Secondary | ICD-10-CM | POA: Diagnosis present

## 2018-01-21 DIAGNOSIS — I252 Old myocardial infarction: Secondary | ICD-10-CM | POA: Diagnosis not present

## 2018-01-21 DIAGNOSIS — I10 Essential (primary) hypertension: Secondary | ICD-10-CM | POA: Diagnosis not present

## 2018-01-21 DIAGNOSIS — R748 Abnormal levels of other serum enzymes: Secondary | ICD-10-CM | POA: Diagnosis not present

## 2018-01-21 DIAGNOSIS — I480 Paroxysmal atrial fibrillation: Secondary | ICD-10-CM | POA: Diagnosis not present

## 2018-01-21 DIAGNOSIS — L240 Irritant contact dermatitis due to detergents: Secondary | ICD-10-CM | POA: Diagnosis present

## 2018-01-21 DIAGNOSIS — I248 Other forms of acute ischemic heart disease: Secondary | ICD-10-CM | POA: Diagnosis not present

## 2018-01-21 DIAGNOSIS — I4891 Unspecified atrial fibrillation: Secondary | ICD-10-CM

## 2018-01-21 DIAGNOSIS — L909 Atrophic disorder of skin, unspecified: Secondary | ICD-10-CM | POA: Diagnosis not present

## 2018-01-21 DIAGNOSIS — Z7982 Long term (current) use of aspirin: Secondary | ICD-10-CM | POA: Diagnosis not present

## 2018-01-21 DIAGNOSIS — Z825 Family history of asthma and other chronic lower respiratory diseases: Secondary | ICD-10-CM | POA: Diagnosis not present

## 2018-01-21 DIAGNOSIS — E440 Moderate protein-calorie malnutrition: Secondary | ICD-10-CM | POA: Diagnosis present

## 2018-01-21 DIAGNOSIS — M6282 Rhabdomyolysis: Secondary | ICD-10-CM | POA: Diagnosis present

## 2018-01-21 DIAGNOSIS — Z66 Do not resuscitate: Secondary | ICD-10-CM | POA: Diagnosis present

## 2018-01-21 DIAGNOSIS — I951 Orthostatic hypotension: Secondary | ICD-10-CM | POA: Diagnosis not present

## 2018-01-21 DIAGNOSIS — R4189 Other symptoms and signs involving cognitive functions and awareness: Secondary | ICD-10-CM | POA: Diagnosis not present

## 2018-01-21 DIAGNOSIS — R7881 Bacteremia: Secondary | ICD-10-CM | POA: Diagnosis present

## 2018-01-21 DIAGNOSIS — Z79899 Other long term (current) drug therapy: Secondary | ICD-10-CM | POA: Diagnosis not present

## 2018-01-21 DIAGNOSIS — R05 Cough: Secondary | ICD-10-CM | POA: Diagnosis present

## 2018-01-21 DIAGNOSIS — I2510 Atherosclerotic heart disease of native coronary artery without angina pectoris: Secondary | ICD-10-CM | POA: Diagnosis not present

## 2018-01-21 DIAGNOSIS — F0390 Unspecified dementia without behavioral disturbance: Secondary | ICD-10-CM | POA: Diagnosis present

## 2018-01-21 DIAGNOSIS — J45909 Unspecified asthma, uncomplicated: Secondary | ICD-10-CM | POA: Diagnosis present

## 2018-01-21 DIAGNOSIS — E785 Hyperlipidemia, unspecified: Secondary | ICD-10-CM | POA: Diagnosis present

## 2018-01-21 DIAGNOSIS — R509 Fever, unspecified: Secondary | ICD-10-CM | POA: Diagnosis not present

## 2018-01-21 DIAGNOSIS — K219 Gastro-esophageal reflux disease without esophagitis: Secondary | ICD-10-CM | POA: Diagnosis present

## 2018-01-21 DIAGNOSIS — R739 Hyperglycemia, unspecified: Secondary | ICD-10-CM | POA: Diagnosis present

## 2018-01-21 DIAGNOSIS — G20 Parkinson's disease: Secondary | ICD-10-CM | POA: Diagnosis not present

## 2018-01-21 DIAGNOSIS — Z955 Presence of coronary angioplasty implant and graft: Secondary | ICD-10-CM | POA: Diagnosis not present

## 2018-01-21 DIAGNOSIS — W1830XA Fall on same level, unspecified, initial encounter: Secondary | ICD-10-CM | POA: Diagnosis present

## 2018-01-21 DIAGNOSIS — T551X1A Toxic effect of detergents, accidental (unintentional), initial encounter: Secondary | ICD-10-CM | POA: Diagnosis present

## 2018-01-21 DIAGNOSIS — F0281 Dementia in other diseases classified elsewhere with behavioral disturbance: Secondary | ICD-10-CM | POA: Diagnosis not present

## 2018-01-21 DIAGNOSIS — Y92009 Unspecified place in unspecified non-institutional (private) residence as the place of occurrence of the external cause: Secondary | ICD-10-CM | POA: Diagnosis not present

## 2018-01-21 HISTORY — DX: Unspecified atrial fibrillation: I48.91

## 2018-01-21 LAB — TROPONIN I
TROPONIN I: 0.17 ng/mL — AB (ref <0.03)
TROPONIN I: 0.19 ng/mL — AB (ref <0.03)
Troponin I: 0.21 ng/mL (ref <0.03)

## 2018-01-21 LAB — URINALYSIS, COMPLETE (UACMP) WITH MICROSCOPIC
BILIRUBIN URINE: NEGATIVE
Glucose, UA: NEGATIVE mg/dL
KETONES UR: NEGATIVE mg/dL
Leukocytes, UA: NEGATIVE
NITRITE: NEGATIVE
PH: 5 (ref 5.0–8.0)
PROTEIN: 100 mg/dL — AB
Specific Gravity, Urine: 1.024 (ref 1.005–1.030)

## 2018-01-21 LAB — BASIC METABOLIC PANEL
ANION GAP: 5 (ref 5–15)
BUN: 40 mg/dL — ABNORMAL HIGH (ref 6–20)
CALCIUM: 8.1 mg/dL — AB (ref 8.9–10.3)
CO2: 26 mmol/L (ref 22–32)
Chloride: 106 mmol/L (ref 101–111)
Creatinine, Ser: 0.94 mg/dL (ref 0.61–1.24)
Glucose, Bld: 133 mg/dL — ABNORMAL HIGH (ref 65–99)
Potassium: 4.5 mmol/L (ref 3.5–5.1)
Sodium: 137 mmol/L (ref 135–145)

## 2018-01-21 LAB — CBC
HCT: 36.4 % — ABNORMAL LOW (ref 40.0–52.0)
HCT: 33.7 % — ABNORMAL LOW (ref 40.0–52.0)
HEMOGLOBIN: 12.1 g/dL — AB (ref 13.0–18.0)
HEMOGLOBIN: 11.2 g/dL — AB (ref 13.0–18.0)
MCH: 29.8 pg (ref 26.0–34.0)
MCH: 30 pg (ref 26.0–34.0)
MCHC: 33.4 g/dL (ref 32.0–36.0)
MCHC: 33.2 g/dL (ref 32.0–36.0)
MCV: 89.2 fL (ref 80.0–100.0)
MCV: 90.2 fL (ref 80.0–100.0)
Platelets: 234 10*3/uL (ref 150–440)
Platelets: 217 10*3/uL (ref 150–440)
RBC: 4.08 MIL/uL — ABNORMAL LOW (ref 4.40–5.90)
RBC: 3.74 MIL/uL — AB (ref 4.40–5.90)
RDW: 14.1 % (ref 11.5–14.5)
RDW: 13.9 % (ref 11.5–14.5)
WBC: 14.7 10*3/uL — ABNORMAL HIGH (ref 3.8–10.6)
WBC: 12.7 10*3/uL — ABNORMAL HIGH (ref 3.8–10.6)

## 2018-01-21 LAB — POTASSIUM: POTASSIUM: 3.8 mmol/L (ref 3.5–5.1)

## 2018-01-21 LAB — TSH: TSH: 1.834 u[IU]/mL (ref 0.350–4.500)

## 2018-01-21 LAB — PROTIME-INR
INR: 1.31
PROTHROMBIN TIME: 16.2 s — AB (ref 11.4–15.2)

## 2018-01-21 LAB — HEMOGLOBIN A1C
HEMOGLOBIN A1C: 6 % — AB (ref 4.8–5.6)
MEAN PLASMA GLUCOSE: 125.5 mg/dL

## 2018-01-21 LAB — CREATININE, SERUM
CREATININE: 0.92 mg/dL (ref 0.61–1.24)
GFR calc Af Amer: >60 mL/min (ref >60)

## 2018-01-21 LAB — MAGNESIUM: Magnesium: 2.2 mg/dL (ref 1.7–2.4)

## 2018-01-21 LAB — CKMB (ARMC ONLY): CK, MB: 6.5 ng/mL — AB (ref 0.5–5.0)

## 2018-01-21 LAB — APTT: APTT: 37 s — AB (ref 24–36)

## 2018-01-21 MED ORDER — TAMSULOSIN HCL 0.4 MG PO CAPS
0.4000 mg | ORAL_CAPSULE | Freq: Every day | ORAL | Status: DC
  Administered 2018-01-21 – 2018-01-26 (×5): 0.4 mg via ORAL
  Filled 2018-01-21 (×5): qty 1

## 2018-01-21 MED ORDER — METOPROLOL TARTRATE 25 MG PO TABS
25.0000 mg | ORAL_TABLET | Freq: Two times a day (BID) | ORAL | Status: DC
  Administered 2018-01-21 – 2018-01-22 (×3): 25 mg via ORAL
  Filled 2018-01-21 (×4): qty 1

## 2018-01-21 MED ORDER — AMIODARONE HCL IN DEXTROSE 360-4.14 MG/200ML-% IV SOLN
30.0000 mg/h | INTRAVENOUS | Status: DC
  Administered 2018-01-21: 59.94 mg/h via INTRAVENOUS
  Administered 2018-01-21 – 2018-01-23 (×5): 30 mg/h via INTRAVENOUS
  Filled 2018-01-21 (×3): qty 200

## 2018-01-21 MED ORDER — LORAZEPAM 2 MG/ML IJ SOLN
0.5000 mg | Freq: Once | INTRAMUSCULAR | Status: AC
  Administered 2018-01-21: 0.5 mg via INTRAVENOUS

## 2018-01-21 MED ORDER — DOXAZOSIN MESYLATE 1 MG PO TABS
1.0000 mg | ORAL_TABLET | Freq: Every day | ORAL | Status: DC
  Filled 2018-01-21: qty 1

## 2018-01-21 MED ORDER — DILTIAZEM HCL 100 MG IV SOLR
5.0000 mg/h | INTRAVENOUS | Status: DC
  Administered 2018-01-21: 7.5 mg/h via INTRAVENOUS
  Administered 2018-01-21: 10 mg/h via INTRAVENOUS
  Administered 2018-01-21: 5 mg/h via INTRAVENOUS
  Filled 2018-01-21: qty 100

## 2018-01-21 MED ORDER — AMIODARONE HCL IN DEXTROSE 360-4.14 MG/200ML-% IV SOLN
60.0000 mg/h | INTRAVENOUS | Status: DC
  Administered 2018-01-21: 60 mg/h via INTRAVENOUS
  Filled 2018-01-21 (×2): qty 200

## 2018-01-21 MED ORDER — ADULT MULTIVITAMIN W/MINERALS CH
1.0000 | ORAL_TABLET | Freq: Every day | ORAL | Status: DC
  Administered 2018-01-23 – 2018-01-26 (×3): 1 via ORAL
  Filled 2018-01-21 (×4): qty 1

## 2018-01-21 MED ORDER — ONDANSETRON HCL 4 MG/2ML IJ SOLN: 4.0000 mg | Freq: Four times a day (QID) | INTRAMUSCULAR | Status: DC | PRN

## 2018-01-21 MED ORDER — ACETAMINOPHEN 325 MG PO TABS: 650.0000 mg | ORAL_TABLET | Freq: Four times a day (QID) | ORAL | Status: DC | PRN

## 2018-01-21 MED ORDER — HYDROCODONE-ACETAMINOPHEN 7.5-325 MG PO TABS
1.0000 | ORAL_TABLET | ORAL | Status: DC | PRN
Start: 2018-01-21 — End: 2018-01-27
  Administered 2018-01-21 – 2018-01-26 (×11): 1 via ORAL
  Filled 2018-01-21 (×15): qty 1

## 2018-01-21 MED ORDER — LORAZEPAM 2 MG/ML IJ SOLN
INTRAMUSCULAR | Status: AC
  Filled 2018-01-21: qty 1

## 2018-01-21 MED ORDER — DIGOXIN 0.25 MG/ML IJ SOLN
0.5000 mg | Freq: Once | INTRAMUSCULAR | Status: AC
  Administered 2018-01-21: 0.5 mg via INTRAVENOUS
  Filled 2018-01-21: qty 2

## 2018-01-21 MED ORDER — METOPROLOL TARTRATE 5 MG/5ML IV SOLN
INTRAVENOUS | Status: AC
Start: 2018-01-21 — End: 2018-01-21
  Filled 2018-01-21: qty 5

## 2018-01-21 MED ORDER — ENOXAPARIN SODIUM 40 MG/0.4ML ~~LOC~~ SOLN: 40.0000 mg | SUBCUTANEOUS | Status: DC

## 2018-01-21 MED ORDER — SODIUM CHLORIDE 0.9 % IV SOLN
INTRAVENOUS | Status: AC
  Administered 2018-01-21: 03:00:00 via INTRAVENOUS

## 2018-01-21 MED ORDER — PANTOPRAZOLE SODIUM 40 MG PO TBEC
40.0000 mg | DELAYED_RELEASE_TABLET | Freq: Two times a day (BID) | ORAL | Status: DC
  Administered 2018-01-21 – 2018-01-26 (×8): 40 mg via ORAL
  Filled 2018-01-21 (×10): qty 1

## 2018-01-21 MED ORDER — MORPHINE SULFATE (PF) 2 MG/ML IV SOLN
2.0000 mg | INTRAVENOUS | Status: DC | PRN
Start: 2018-01-21 — End: 2018-01-27
  Administered 2018-01-21 – 2018-01-26 (×11): 2 mg via INTRAVENOUS
  Filled 2018-01-21 (×13): qty 1

## 2018-01-21 MED ORDER — HEPARIN BOLUS VIA INFUSION
4000.0000 [IU] | Freq: Once | INTRAVENOUS | Status: AC
  Administered 2018-01-21: 4000 [IU] via INTRAVENOUS
  Filled 2018-01-21: qty 4000

## 2018-01-21 MED ORDER — METOPROLOL TARTRATE 5 MG/5ML IV SOLN
5.0000 mg | INTRAVENOUS | Status: DC | PRN
  Administered 2018-01-21 – 2018-01-23 (×3): 5 mg via INTRAVENOUS
  Filled 2018-01-21 (×2): qty 5

## 2018-01-21 MED ORDER — DILTIAZEM LOAD VIA INFUSION
10.0000 mg | Freq: Once | INTRAVENOUS | Status: AC
  Administered 2018-01-21: 10 mg via INTRAVENOUS
  Filled 2018-01-21: qty 10

## 2018-01-21 MED ORDER — ISOSORBIDE MONONITRATE ER 60 MG PO TB24: 60.0000 mg | ORAL_TABLET | Freq: Every day | ORAL | Status: DC

## 2018-01-21 MED ORDER — ONDANSETRON HCL 4 MG PO TABS: 4.0000 mg | ORAL_TABLET | Freq: Four times a day (QID) | ORAL | Status: DC | PRN

## 2018-01-21 MED ORDER — HYDRALAZINE HCL 20 MG/ML IJ SOLN: 10.0000 mg | Freq: Four times a day (QID) | INTRAMUSCULAR | Status: DC | PRN

## 2018-01-21 MED ORDER — ACETAMINOPHEN 650 MG RE SUPP
650.0000 mg | Freq: Four times a day (QID) | RECTAL | Status: DC | PRN
  Administered 2018-01-25: 650 mg via RECTAL
  Filled 2018-01-21: qty 1

## 2018-01-21 MED ORDER — AMIODARONE LOAD VIA INFUSION
150.0000 mg | Freq: Once | INTRAVENOUS | Status: AC
  Administered 2018-01-21: 150 mg via INTRAVENOUS
  Filled 2018-01-21: qty 83.34

## 2018-01-21 MED ORDER — ASPIRIN EC 81 MG PO TBEC
81.0000 mg | DELAYED_RELEASE_TABLET | Freq: Every day | ORAL | Status: DC
  Administered 2018-01-21 – 2018-01-26 (×5): 81 mg via ORAL
  Filled 2018-01-21 (×6): qty 1

## 2018-01-21 MED ORDER — HEPARIN (PORCINE) IN NACL 100-0.45 UNIT/ML-% IJ SOLN
1250.0000 [IU]/h | INTRAMUSCULAR | Status: DC
  Administered 2018-01-21: 1000 [IU]/h via INTRAVENOUS
  Administered 2018-01-22: 1400 [IU]/h via INTRAVENOUS
  Administered 2018-01-22: 1250 [IU]/h via INTRAVENOUS
  Administered 2018-01-23: 1450 [IU]/h via INTRAVENOUS
  Filled 2018-01-21 (×4): qty 250

## 2018-01-21 MED ORDER — ENSURE ENLIVE PO LIQD
237.0000 mL | Freq: Two times a day (BID) | ORAL | Status: DC
  Administered 2018-01-22 – 2018-01-26 (×6): 237 mL via ORAL

## 2018-01-21 MED ORDER — ATORVASTATIN CALCIUM 20 MG PO TABS
80.0000 mg | ORAL_TABLET | Freq: Every day | ORAL | Status: DC
  Administered 2018-01-21 – 2018-01-26 (×5): 80 mg via ORAL
  Filled 2018-01-21 (×5): qty 4

## 2018-01-21 NOTE — Progress Notes (Signed)
Patient went into Afib with rapid ventricular rate, heart rate is in the 160's. Ordered EKG. Notified Dr. Marcille Blanco who informed Dr. Benjie Karvonen about patients current condition. Family is at the bedside. Patient denies any palpitations or chest pain. Administered IV metoprolol. Performed hand off with Lurlean Horns.

## 2018-01-21 NOTE — Progress Notes (Signed)
Designated iv line started. cardizem gtt initated. bp marginal at 93/67. Rate 150s-160's. Central tele notified.

## 2018-01-21 NOTE — Care Management (Signed)
Patient placed in observation after presenting to ED after family found him down in his home where he lives alone. Daughter lives close by. Unknown how long he was down. PT consult is pending.  Patient has had [place] In the past.

## 2018-01-21 NOTE — ED Notes (Signed)
Protective barrier placed over sacral wound by Laury Axon, EDT.

## 2018-01-21 NOTE — H&P (Signed)
[facility] NAME: [name]    MR#:  [id number]  DATE OF BIRTH:  Oct 01, 1931  DATE OF ADMISSION:  01/20/2018  PRIMARY CARE PHYSICIAN: [name], FNP   REQUESTING/REFERRING PHYSICIAN: Archie Balboa, MD  CHIEF COMPLAINT:   Chief Complaint  Patient presents with  . Fall    HISTORY OF PRESENT ILLNESS:  [name]  is a 84 y.o. male who presents with all at home and unresponsive episode.  Patient was found on the floor in his kitchen, down for an unknown amount of time.  Here in the ED he was able to wake up and respond to some degree.  He was found to have pressure sore on his shoulder.  His initial workup showed leukocytosis, elevated troponin.  Unclear etiology for his fall and time down.  He had some elevation of his CK, though he is not in rhabdo and his kidney function was okay.  He is somewhat sleepy on exam and does not contribute much information is HPI, though he is arousable.  Family at bedside contributes information.  Hospitalist were called for admission and further evaluation  PAST MEDICAL HISTORY:   Past Medical History:  Diagnosis Date  . Arthritis    "hips, back" (06/17/2015)  . Asthma   . Chronic chest pain   . Chronic lower back pain   . Coronary artery disease    a. s/p BMS to RCA in 2002; b. s/p cutting balloon POBA ;   c. cath 6/12: oDx 80% (treated with repeat cutting balloon POBA), mLAD 50% with 30-40% at Dx, CFX 30%, pRCA 25% with patent stents;  d.  Lex MV 4/14:  Low Risk - EF 61%, inf scar with peri-infarct ischemia  . Dyspnea    chronic  . GERD (gastroesophageal reflux disease)    h/o esophageal spasm  . Headache   . History of blood transfusion    "related to OR"  . History of hiatal hernia   . Hyperlipidemia   . Hypertension   . Melanoma of lower back ([place]) late 1990's  . Memory loss   . Myocardial infarction ([place]) 2001   x 1, confirned 1 possible     PAST SURGICAL HISTORY:    Past Surgical History:  Procedure Laterality Date  . ANTERIOR CERVICAL DECOMP/DISCECTOMY FUSION    . BACK SURGERY  multiple  . CARDIAC CATHETERIZATION     "I've had 17 caths" (06/17/2015)  . CATARACT EXTRACTION W/ INTRAOCULAR LENS  IMPLANT, BILATERAL Bilateral   . CERVICAL DISC SURGERY  multiple  . COLONOSCOPY WITH PROPOFOL N/A 04/17/2014   Procedure: COLONOSCOPY WITH PROPOFOL;  Surgeon: Winfield Cunas., MD;  Location: [facility] ENDOSCOPY;  Service: Endoscopy;  Laterality: N/A;  . CORONARY ANGIOPLASTY WITH STENT PLACEMENT  x 2 stents    previous percutaneous intervention on the  RCA and the diagonal branch  . ESOPHAGOGASTRODUODENOSCOPY  03/23/2012   Procedure: ESOPHAGOGASTRODUODENOSCOPY (EGD);  Surgeon: Winfield Cunas., MD;  Location: [facility] ENDOSCOPY;  Service: Endoscopy;  Laterality: N/A;  . ESOPHAGOGASTRODUODENOSCOPY N/A 03/04/2014   Procedure: ESOPHAGOGASTRODUODENOSCOPY (EGD);  Surgeon: Arta Silence, MD;  Location: [facility] ENDOSCOPY;  Service: Endoscopy;  Laterality: N/A;  . LAMINECTOMY    . LEFT HEART CATHETERIZATION WITH CORONARY ANGIOGRAM N/A 01/13/2014   Procedure: LEFT HEART CATHETERIZATION WITH CORONARY ANGIOGRAM;  Surgeon: Blane Ohara, MD;  Location: [facility] CATH LAB;  Service: Cardiovascular;  Laterality: N/A;  . MELANOMA EXCISION  late 1990's   "  lower back"  . POSTERIOR LAMINECTOMY / DECOMPRESSION CERVICAL SPINE    . SAVORY DILATION  03/23/2012   Procedure: SAVORY DILATION;  Surgeon: Winfield Cunas., MD;  Location: [facility] ENDOSCOPY;  Service: Endoscopy;  Laterality: N/A;  . TONSILLECTOMY  1930's     SOCIAL HISTORY:   Social History   Tobacco Use  . Smoking status: Never Smoker  . Smokeless tobacco: Never Used  Substance Use Topics  . Alcohol use: No     FAMILY HISTORY:   Family History  Problem Relation Age of Onset  . Diabetes Father   . Heart disease Father   . Asthma Father   . Heart disease Mother        CABG hx age 21  . Colon cancer Son        hx   . Colitis  Son        hx  . Crohn's disease Son   . Prostate cancer Paternal Grandfather      DRUG ALLERGIES:   Allergies  Allergen Reactions  . Budesonide-Formoterol Fumarate Swelling    Mouth and tongue swelling.  . Iohexol Shortness Of Breath and Itching    Pt was given 100cc of Omnipaque 300 followed by itching/ dyspnea.  . Simvastatin Other (See Comments)    REACTION: unknown  . Demerol [Meperidine] Other (See Comments)    REACTION: Hallucinations  . Ivp Dye [Iodinated Diagnostic Agents] Other (See Comments)    Iodine pt had a reaction when he had a CT DONE  . Meperidine Hcl Other (See Comments)    REACTION: Hallucinations  . Tape Other (See Comments)    Tears skin off, Please use "paper" tape  . Naproxen Swelling  . Pregabalin Rash  . Sulfonamide Derivatives Rash    MEDICATIONS AT HOME:   Prior to Admission medications   Medication Sig Start Date End Date Taking? Authorizing Provider  acetaminophen (TYLENOL) 325 MG tablet Take 2 tablets (650 mg total) by mouth every 4 (four) hours as needed for headache or mild pain. 12/16/13   Erlene Quan, PA-C  aspirin EC 81 MG tablet Take 1 tablet (81 mg total) by mouth daily. 03/08/14   Mikhail, Velta Addison, DO  atorvastatin (LIPITOR) 80 MG tablet Take 1 tablet (80 mg total) by mouth daily. 12/27/17 03/27/18  Richardson Dopp T, PA-C  cyclobenzaprine (FLEXERIL) 5 MG tablet Take 7.5 mg by mouth 3 (three) times daily as needed for muscle spasms.     [provider]  doxazosin (CARDURA) 1 MG tablet TAKE 1 TABLET BY MOUTH ONCE DAILY. 06/01/17   Fay Records, MD  feeding supplement, ENSURE ENLIVE, (ENSURE ENLIVE) LIQD Take 237 mLs by mouth 2 (two) times daily between meals. 08/02/17   Fritzi Mandes, MD  HYDROcodone-acetaminophen ([place]) 7.5-325 MG per tablet Take 1 tablet by mouth every 4 (four) hours as needed for moderate pain. FOR PAIN 06/05/14   [provider]  isosorbide mononitrate (IMDUR) 60 MG 24 hr tablet Take 1 tablet (60 mg total)  by mouth daily. 08/14/17   Richardson Dopp T, PA-C  metoprolol tartrate (LOPRESSOR) 25 MG tablet Take 1 tablet (25 mg total) by mouth 2 (two) times daily. 08/14/17   Richardson Dopp T, PA-C  nitroGLYCERIN (NITROSTAT) 0.4 MG SL tablet Place 1 tablet (0.4 mg total) under the tongue every 5 (five) minutes as needed for chest pain. 09/15/16   Fay Records, MD  pantoprazole (PROTONIX) 40 MG tablet TAKE 1 TABLET BY MOUTH TWICE DAILY 06/01/17  Fay Records, MD  tamsulosin (FLOMAX) 0.4 MG CAPS capsule Take 0.4 mg by mouth daily.  01/13/15   [provider]  vitamin B-12 1000 MCG tablet Take 1 tablet (1,000 mcg total) by mouth daily. 06/21/17   Geradine Girt, DO    REVIEW OF SYSTEMS:  Review of Systems  Unable to perform ROS: Acuity of condition     VITAL SIGNS:   Vitals:   01/20/18 2033  BP: (!) 184/100  Pulse: 88  Resp: 20  SpO2: 98%   Wt Readings from Last 3 Encounters:  12/26/17 69.9 kg (154 lb)  08/14/17 74.9 kg (165 lb 1.9 oz)  08/02/17 69.9 kg (154 lb)    PHYSICAL EXAMINATION:  Physical Exam  Vitals reviewed. Constitutional: He appears well-developed and well-nourished. No distress.  HENT:  Head: Normocephalic and atraumatic.  Mouth/Throat: Oropharynx is clear and moist.  Eyes: Pupils are equal, round, and reactive to light. Conjunctivae and EOM are normal. No scleral icterus.  Neck: Normal range of motion. Neck supple. No JVD present. No thyromegaly present.  Cardiovascular: Normal rate, regular rhythm and intact distal pulses. Exam reveals no gallop and no friction rub.  No murmur heard. Respiratory: Effort normal and breath sounds normal. No respiratory distress. He has no wheezes. He has no rales.  GI: Soft. Bowel sounds are normal. He exhibits no distension. There is no tenderness.  Musculoskeletal: Normal range of motion. He exhibits no edema.  No arthritis, no gout  Lymphadenopathy:    He has no cervical adenopathy.  Neurological:  Unable to fully assess due  to patient condition  Skin: Skin is warm and dry. No rash noted. No erythema.  Shoulder pressure injury  Psychiatric:  Unable to assess due to patient condition    LABORATORY PANEL:   CBC Recent Labs  Lab 01/20/18 2036  WBC 16.9*  HGB 13.6  HCT 41.3  PLT 258   ------------------------------------------------------------------------------------------------------------------  Chemistries  Recent Labs  Lab 01/20/18 2036  NA 137  K 3.7  CL 101  CO2 26  GLUCOSE 171*  BUN 40*  CREATININE 0.95  CALCIUM 8.4*  AST 45*  ALT 36  ALKPHOS 76  BILITOT 1.7*   ------------------------------------------------------------------------------------------------------------------  Cardiac Enzymes Recent Labs  Lab 01/20/18 2036  TROPONINI 0.25*   ------------------------------------------------------------------------------------------------------------------  RADIOLOGY:  Ct Head Wo Contrast  Result Date: 01/20/2018 CLINICAL DATA:  Found on the floor.  Dementia. EXAM: CT HEAD WITHOUT CONTRAST CT CERVICAL SPINE WITHOUT CONTRAST TECHNIQUE: Multidetector CT imaging of the head and cervical spine was performed following the standard protocol without intravenous contrast. Multiplanar CT image reconstructions of the cervical spine were also generated. COMPARISON:  07/31/2017 FINDINGS: CT HEAD FINDINGS Brain: Generalized atrophy. Chronic small-vessel ischemic changes of the hemispheric white matter. No sign of acute infarction, mass lesion, hemorrhage, hydrocephalus or extra-axial collection. Vascular: There is atherosclerotic calcification of the major vessels at the base of the brain. Skull: Normal Sinuses/Orbits: Clear/normal Other: None CT CERVICAL SPINE FINDINGS Alignment: Normal Skull base and vertebrae: Chronic arthropathy at the C1-2 articulation with moderate encroachment upon the spinal canal. Some potential for up or cervical cord impingement. Soft tissues and spinal canal: Negative Disc  levels: Chronic degenerative spondylosis at C2-3 but without compressive stenosis. Previous ACDF C3 through C7 appears solid with sufficient patency of the canal and foramina. C7-T1 shows mild disc degeneration. Upper chest: Negative Other: None IMPRESSION: Head CT: No acute or traumatic finding. Atrophy and chronic small-vessel ischemic changes. Cervical spine CT: No acute or traumatic  finding. Previous ACDF C3 through C7. Arthropathy at the C1-2 articulation which results in encroachment upon the spinal canal that could impinge upon the cervical spinal cord. This is a chronic finding. 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 Electronically Signed   By: Nelson Chimes M.D.   On: 01/20/2018 21:42   Ct Cervical Spine Wo Contrast  Result Date: 01/20/2018 CLINICAL DATA:  Found on the floor.  Dementia. EXAM: CT HEAD WITHOUT CONTRAST CT CERVICAL SPINE WITHOUT CONTRAST TECHNIQUE: Multidetector CT imaging of the head and cervical spine was performed following the standard protocol without intravenous contrast. Multiplanar CT image reconstructions of the cervical spine were also generated. COMPARISON:  07/31/2017 FINDINGS: CT HEAD FINDINGS Brain: Generalized atrophy. Chronic small-vessel ischemic changes of the hemispheric white matter. No sign of acute infarction, mass lesion, hemorrhage, hydrocephalus or extra-axial collection. Vascular: There is atherosclerotic calcification of the major vessels at the base of the brain. Skull: Normal Sinuses/Orbits: Clear/normal Other: None CT CERVICAL SPINE FINDINGS Alignment: Normal Skull base and vertebrae: Chronic arthropathy at the C1-2 articulation with moderate encroachment upon the spinal canal. Some potential for up or cervical cord impingement. Soft tissues and spinal canal: Negative Disc levels: Chronic degenerative spondylosis at C2-3 but without compressive stenosis. Previous ACDF C3 through C7 appears solid with sufficient patency of the canal and foramina. C7-T1 shows  mild disc degeneration. Upper chest: Negative Other: None IMPRESSION: Head CT: No acute or traumatic finding. Atrophy and chronic small-vessel ischemic changes. Cervical spine CT: No acute or traumatic finding. Previous ACDF C3 through C7. Arthropathy at the C1-2 articulation which results in encroachment upon the spinal canal that could impinge upon the cervical spinal cord. This is a chronic finding. 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 Electronically Signed   By: Nelson Chimes M.D.   On: 01/20/2018 21:42    EKG:   Orders placed or performed during the hospital encounter of 01/20/18  . EKG 12-Lead  . EKG 12-Lead    IMPRESSION AND PLAN:  Principal Problem:   Unresponsive episode -unclear etiology, at this time workup favors possible cardiac event.  However, patient is arousable now, though he is somewhat somnolent and sleepy.  We will continue to monitor closely and workup other problems as below Active Problems:   Elevated troponin -unclear if from true cardiac event or from laying on the floor for an extended period of time.  His CK is elevated, though his troponin is somewhat more elevated than I would expect it to be just from muscle breakdown.  Will cycle cardiac enzymes, get an echocardiogram and a cardiology consult.   Leukocytosis -unclear if this is just hemoconcentration or indicative of infection.  UA and chest x-ray do not show infection.  We will check CBC with morning labs after some hydration and reassess   CAD (coronary artery disease) -continue home meds, other workup as above   Essential hypertension -continue home meds   Hyperlipidemia -dose antilipid   GERD -home dose PPI  Chart review performed and case discussed with ED provider. Labs, imaging and/or ECG reviewed by provider and discussed with patient/family. Management plans discussed with the patient and/or family.  DVT PROPHYLAXIS: SubQ lovenox  GI PROPHYLAXIS: PPI  ADMISSION STATUS: Observation  CODE  STATUS: DNR Code Status History    Date Active Date Inactive Code Status Order ID Comments User Context   07/31/2017 2140 08/02/2017 1405 DNR [id number]  Harrie Foreman, MD Inpatient   06/19/2017 0723 06/20/2017 2230 DNR [id number]  Rise Patience, MD  Inpatient   06/18/2017 2357 06/19/2017 0723 Full Code [id number]  Rise Patience, MD ED   11/14/2015 1651 11/15/2015 1921 Full Code [id number]  Dorothy Spark, MD Inpatient   06/17/2015 1727 06/18/2015 2103 Full Code [id number]  Francesca Oman, DO Inpatient   03/03/2014 0212 03/05/2014 1758 Full Code [id number]  Bynum Bellows, MD Inpatient   01/13/2014 1324 01/13/2014 1843 Full Code [id number]  Sherren Mocha, MD Inpatient   12/14/2013 1627 12/16/2013 2055 Full Code [id number]  Arnoldo Lenis, MD ED    Questions for Most Recent Historical Code Status (Order [id number])    Question Answer Comment   In the event of cardiac or respiratory ARREST Do not call a "code blue"    In the event of cardiac or respiratory ARREST Do not perform Intubation, CPR, defibrillation or ACLS    In the event of cardiac or respiratory ARREST Use medication by any route, position, wound care, and other measures to relive pain and suffering. May use oxygen, suction and manual treatment of airway obstruction as needed for comfort.       TOTAL TIME TAKING CARE OF THIS PATIENT: 40 minutes.   Goldia Ligman [place] 01/21/2018, 12:53 AM  [facility]  Office  [phone]  CC: Primary care physician; [name], FNP  Note:  This document was prepared using Dragon voice recognition software and may include unintentional dictation errors.

## 2018-01-21 NOTE — Progress Notes (Signed)
Family Meeting Note  Advance Directive:yes  Today a meeting took place with the patients daughter POA.  Patient is unable to participate due TK:KOECXF capacity Dementia   The following clinical team members were present during this meeting:MD  The following were discussed:Patient's diagnosis: New onset atrial fibrillation, Patient's progosis: Unable to determine and Goals for treatment: DNR  Additional follow-up to be provided: Patient is DNR.  No need to update advanced directives.  Time spent during discussion: 16 minutes  Pierce Biagini, MD

## 2018-01-21 NOTE — Progress Notes (Signed)
Patient briefly seen and examined.  Daughter is at bedside.  Patient here with unwitnessed fall.  No loss consciousness is reported.  Patient with new onset atrial atrial fibrillation RVR.  I will start diltiazem.  Cardiology consultation pending.  Physical therapy consultation recommended for discharge planning. All troponins and echocardiogram. Wound care consultation for wounds on the back.   Patient is DNR.
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
## 2018-01-21 NOTE — Consult Note (Addendum)
Cardiology Consultation:   Patient ID: [name]; [id number]; [birth year]   Admit date: 01/20/2018 Date of Consult: 01/21/2018  Primary Care Provider: [name], FNP Primary Cardiologist: Harrington Challenger   Patient Profile:   [name] is a 84 y.o. male with a hx of CADs/p stent to the RCA and cutting balloon angioplasty to the diagonal,dementia,chronic chest pain, GI bleed felt to possibly be diverticular, orthostatic hypotension, hypertension,hyperlipidemia, GERD who is being seen today for the evaluation of new onset Afib with RVR and possible fall vs syncope at the request of Dr. Jannifer Franklin, MD.  History of Present Illness:   [name] last underwent cath in 2015 that demonstrated patent RCA stent. There was 70-75% stenosis in the D1 that was not hemodynamically significant by FFR. He was admitted in 02/2014 with a GI bleed, felt to possibly be diverticular. He has known orthostatic hypotension, admitted in 07/2017 with presyncope and AKI in the setting of a 40 mmHg BP drop from lying to standing. He was most recently seen in the office on 12/26/2017 for follow up. He continued to note occasional episodes of chest discomfort that he had noted for years. He was noted to have been taken off Lasix since his 07/2017 and reported more swelling in his ankles. Compression stockings were advised given his issues with orthostasis.   Patient was last seen upright and in his usual state of health on 4/2 by his son. He was heard, through the wall of his house, by his daughter on 4/3, though she did not lay eyes on him. He was found minimally responsive on the kitchen floor by his family on 4/6. It is unknown how long he was on the floor. He was found laying in a pool of his urine, pants and underwear were off and he had poured dish soap over his head. EMS was called and he was brought to [facility].   Upon the patient's arrival to [facility] they were found to have BP that ranged from 184/100-->91/67, HR  ranged from 57-->109 bpm, temp 97.8, oxygen saturation 98% on room air, weight 148 pounds. EKG as below, CXR showed shallow inspiration without acute process. CT head/c spine without acute findings, noted was arthropathy at the C1-2 articulation which resulted in encroachment upon the spinal canal that could impinge upon the cervical spinal cord (chronic). Labs showed troponin 0.25-->0.21-->0.17, SCr 0.95-->0.92-->0.94, CK 467, Na 137. K+ 3.7-->4.5 with noted hemolysis, glucose 171, albumin 3.0, AST 45, WBC 14.7-->12.7, HGB 12.1-->11.2, PLT 234-->217, TSH normal. Orthostatic vital signs were not checked. He has been given IV fluids including a normal saline bolus, ASA 324 mg x 1. This morning he developed new onset Afib with RVR with heart rates into the 180s bpm. He received IV Lopressor and has been started on a diltiazem gtt. He was not started on heparin gtt. He briefly converted to sinus rhythm at 7:22 AM, though is back in Afib with RVR with heart rates in the 150-160 bpm range. Currently, somnolent, though will converse and open eyes when spoke to. History is taken from Epic and patient's daughter at bedside.    Past Medical History:  Diagnosis Date  . Arthritis    "hips, back" (06/17/2015)  . Asthma   . Chronic chest pain   . Chronic lower back pain   . Coronary artery disease    a. s/p BMS to RCA in 2002; b. s/p cutting balloon POBA ;   c. cath 6/12: oDx 80% (treated with  repeat cutting balloon POBA), mLAD 50% with 30-40% at Dx, CFX 30%, pRCA 25% with patent stents;  d.  Lex MV 4/14:  Low Risk - EF 61%, inf scar with peri-infarct ischemia  . Dyspnea    chronic  . GERD (gastroesophageal reflux disease)    h/o esophageal spasm  . Headache   . History of blood transfusion    "related to OR"  . History of hiatal hernia   . Hyperlipidemia   . Hypertension   . Melanoma of lower back ([place]) late 1990's  . Memory loss   . Myocardial infarction ([place]) 2001   x 1, confirned 1 possible    Past  Surgical History:  Procedure Laterality Date  . ANTERIOR CERVICAL DECOMP/DISCECTOMY FUSION    . BACK SURGERY  multiple  . CARDIAC CATHETERIZATION     "I've had 17 caths" (06/17/2015)  . CATARACT EXTRACTION W/ INTRAOCULAR LENS  IMPLANT, BILATERAL Bilateral   . CERVICAL DISC SURGERY  multiple  . COLONOSCOPY WITH PROPOFOL N/A 04/17/2014   Procedure: COLONOSCOPY WITH PROPOFOL;  Surgeon: Winfield Cunas., MD;  Location: [facility] ENDOSCOPY;  Service: Endoscopy;  Laterality: N/A;  . CORONARY ANGIOPLASTY WITH STENT PLACEMENT  x 2 stents    previous percutaneous intervention on the  RCA and the diagonal branch  . ESOPHAGOGASTRODUODENOSCOPY  03/23/2012   Procedure: ESOPHAGOGASTRODUODENOSCOPY (EGD);  Surgeon: Winfield Cunas., MD;  Location: [facility];  Service: Endoscopy;  Laterality: N/A;  . ESOPHAGOGASTRODUODENOSCOPY N/A 03/04/2014   Procedure: ESOPHAGOGASTRODUODENOSCOPY (EGD);  Surgeon: Arta Silence, MD;  Location: [facility] ENDOSCOPY;  Service: Endoscopy;  Laterality: N/A;  . LAMINECTOMY    . LEFT HEART CATHETERIZATION WITH CORONARY ANGIOGRAM N/A 01/13/2014   Procedure: LEFT HEART CATHETERIZATION WITH CORONARY ANGIOGRAM;  Surgeon: Blane Ohara, MD;  Location: [facility] CATH LAB;  Service: Cardiovascular;  Laterality: N/A;  . MELANOMA EXCISION  late 1990's   "lower back"  . POSTERIOR LAMINECTOMY / DECOMPRESSION CERVICAL SPINE    . SAVORY DILATION  03/23/2012   Procedure: SAVORY DILATION;  Surgeon: Winfield Cunas., MD;  Location: [facility] ENDOSCOPY;  Service: Endoscopy;  Laterality: N/A;  . TONSILLECTOMY  1930's     Home Meds: Prior to Admission medications   Medication Sig Start Date End Date Taking? Authorizing Provider  acetaminophen (TYLENOL) 325 MG tablet Take 2 tablets (650 mg total) by mouth every 4 (four) hours as needed for headache or mild pain. 12/16/13  Yes Erlene Quan, PA-C  aspirin EC 81 MG tablet Take 1 tablet (81 mg total) by mouth daily. 03/08/14  Yes Mikhail, Maryann, DO  atorvastatin  (LIPITOR) 80 MG tablet Take 1 tablet (80 mg total) by mouth daily. 12/27/17 03/27/18 Yes Weaver, Scott T, PA-C  cyclobenzaprine (FLEXERIL) 5 MG tablet Take 7.5 mg by mouth 3 (three) times daily as needed for muscle spasms.    Yes [provider]  doxazosin (CARDURA) 1 MG tablet TAKE 1 TABLET BY MOUTH ONCE DAILY. 06/01/17  Yes Fay Records, MD  feeding supplement, ENSURE ENLIVE, (ENSURE ENLIVE) LIQD Take 237 mLs by mouth 2 (two) times daily between meals. 08/02/17  Yes Fritzi Mandes, MD  HYDROcodone-acetaminophen (NORCO) 7.5-325 MG per tablet Take 1 tablet by mouth every 4 (four) hours as needed for moderate pain. FOR PAIN 06/05/14  Yes [provider]  isosorbide mononitrate (IMDUR) 60 MG 24 hr tablet Take 1 tablet (60 mg total) by mouth daily. 08/14/17  Yes Weaver, Scott T, PA-C  metoprolol tartrate (LOPRESSOR) 25 MG tablet  Take 1 tablet (25 mg total) by mouth 2 (two) times daily. 08/14/17  Yes Weaver, Scott T, PA-C  nitroGLYCERIN (NITROSTAT) 0.4 MG SL tablet Place 1 tablet (0.4 mg total) under the tongue every 5 (five) minutes as needed for chest pain. 09/15/16  Yes Fay Records, MD  pantoprazole (PROTONIX) 40 MG tablet TAKE 1 TABLET BY MOUTH TWICE DAILY 06/01/17  Yes Fay Records, MD  tamsulosin (FLOMAX) 0.4 MG CAPS capsule Take 0.4 mg by mouth daily.  01/13/15  Yes [provider]  vitamin B-12 1000 MCG tablet Take 1 tablet (1,000 mcg total) by mouth daily. 06/21/17  Yes Geradine Girt, DO    Inpatient Medications: Scheduled Meds: . aspirin EC  81 mg Oral Daily  . atorvastatin  80 mg Oral Daily  . doxazosin  1 mg Oral Daily  . enoxaparin (LOVENOX) injection  40 mg Subcutaneous Q24H  . feeding supplement (ENSURE ENLIVE)  237 mL Oral BID BM  . isosorbide mononitrate  60 mg Oral Daily  . metoprolol tartrate  25 mg Oral BID  . pantoprazole  40 mg Oral BID  . tamsulosin  0.4 mg Oral Daily   Continuous Infusions: . sodium chloride 75 mL/hr at 01/21/18 0243  . diltiazem  (CARDIZEM) infusion 5 mg/hr (01/21/18 0905)   PRN Meds: acetaminophen **OR** acetaminophen, hydrALAZINE, HYDROcodone-acetaminophen, metoprolol tartrate, ondansetron **OR** ondansetron (ZOFRAN) IV  Allergies:   Allergies  Allergen Reactions  . Budesonide-Formoterol Fumarate Swelling    Mouth and tongue swelling.  . Iohexol Shortness Of Breath and Itching    Pt was given 100cc of Omnipaque 300 followed by itching/ dyspnea.  . Simvastatin Other (See Comments)    REACTION: unknown  . Demerol [Meperidine] Other (See Comments)    REACTION: Hallucinations  . Ivp Dye [Iodinated Diagnostic Agents] Other (See Comments)    Iodine pt had a reaction when he had a CT DONE  . Meperidine Hcl Other (See Comments)    REACTION: Hallucinations  . Tape Other (See Comments)    Tears skin off, Please use "paper" tape  . Naproxen Swelling  . Pregabalin Rash  . Sulfonamide Derivatives Rash    Social History:   Social History   Socioeconomic History  . Marital status: Widowed    Spouse name: Not on file  . Number of children: 5  . Years of education: 45  . Highest education level: Not on file  Occupational History  . Occupation: retired    Fish farm manager: RETIRED    Comment: Teacher, English as a foreign language  . Financial resource strain: Not on file  . Food insecurity:    Worry: Not on file    Inability: Not on file  . Transportation needs:    Medical: Not on file    Non-medical: Not on file  Tobacco Use  . Smoking status: Never Smoker  . Smokeless tobacco: Never Used  Substance and Sexual Activity  . Alcohol use: No  . Drug use: No  . Sexual activity: Never    Birth control/protection: None  Lifestyle  . Physical activity:    Days per week: Not on file    Minutes per session: Not on file  . Stress: Not on file  Relationships  . Social connections:    Talks on phone: Not on file    Gets together: Not on file    Attends religious service: Not on file    Active member of club or  organization: Not on file    Attends meetings  of clubs or organizations: Not on file    Relationship status: Not on file  . Intimate partner violence:    Fear of current or ex partner: Not on file    Emotionally abused: Not on file    Physically abused: Not on file    Forced sexual activity: Not on file  Other Topics Concern  . Not on file  Social History Narrative   Lives in [place]. Generaly active around the house and walks his dog without chest pain or SOB.   Right-handed.   Lives alone.   No caffeine use.     Family History:   Family History  Problem Relation Age of Onset  . Diabetes Father   . Heart disease Father   . Asthma Father   . Heart disease Mother        CABG hx age 5  . Colon cancer Son        hx   . Colitis Son        hx  . Crohn's disease Son   . Prostate cancer Paternal Grandfather     ROS:  Review of Systems  Unable to perform ROS: Dementia      Physical Exam/Data:   Vitals:   01/21/18 0830 01/21/18 0900 01/21/18 0916 01/21/18 0927  BP: (!) 134/120 94/68 91/67 104/63  Pulse:      Resp: 20 18 20 20   Temp: 98.9 F (37.2 C)     TempSrc:      SpO2: 95% 95% 96% 98%  Weight:        Intake/Output Summary (Last 24 hours) at 01/21/2018 0959 Last data filed at 01/21/2018 0700 Gross per 24 hour  Intake 1321.25 ml  Output -  Net 1321.25 ml   Filed Weights   01/21/18 0224  Weight: 148 lb (67.1 kg)   Body mass index is 21.86 kg/m.   Physical Exam: General: Elderly and frail appearing, in no acute distress. Head: Normocephalic, atraumatic, sclera non-icteric, no xanthomas, nares without discharge.  Neck: Negative for carotid bruits. JVD not elevated. Lungs: Clear bilaterally to auscultation without wheezes, rales, or rhonchi. Breathing is unlabored. Heart: Tachycardic, irregularly irregular with S1 S2. No murmurs, rubs, or gallops appreciated. Abdomen: Soft, non-tender, non-distended with normoactive bowel sounds. No hepatomegaly. No  rebound/guarding. No obvious abdominal masses. Msk:  Strength and tone appear normal for age. Extremities: No clubbing or cyanosis. No edema. Distal pedal pulses are 2+ and equal bilaterally. Neuro: Alert. No facial asymmetry. No focal deficit. Moves all extremities spontaneously. Psych:  Will open eyes when asked, responds to questions.   EKG:  The EKG was personally reviewed and demonstrates: 20:33, NSR, 96 bpm, baseline artifact, occasional PVCs, nonspecific st/t changes. Repeat EKG at 6:55 A< today showed Afib with RVR, 177 bpm, marked st depression and TWI Telemetry:  Telemetry was personally reviewed and demonstrates: Initially with NSR with occasional PACs/PVCs. Afib with RVR at 6:45 AM with brief conversion to NSR at 7:22 AM. Heart rates have ranged into the 180s bpm. Currently in Afib with RVR with heart rates in the 160s bpm.   Weights: Filed Weights   01/21/18 0224  Weight: 148 lb (67.1 kg)    Relevant CV Studies: TTE 06/2017: Study Conclusions  - Left ventricle: The cavity size was normal. There was moderate   concentric hypertrophy. Systolic function was normal. The   estimated ejection fraction was in the range of 55% to 60%. Wall   motion was normal; there were no regional wall  motion   abnormalities. Doppler parameters are consistent with abnormal   left ventricular relaxation (grade 1 diastolic dysfunction). - Aortic valve: Transvalvular velocity was within the normal range.   There was no stenosis. There was no regurgitation. - Mitral valve: Transvalvular velocity was within the normal range.   There was no evidence for stenosis. There was no regurgitation.   Valve area by pressure half-time: 2.37 cm^2. Valve area by   continuity equation (using LVOT flow): 1.58 cm^2. - Left atrium: The atrium was mildly dilated. - Right ventricle: The cavity size was normal. Wall thickness was   normal. Systolic function was normal. - Atrial septum: No defect or patent foramen  ovale was identified. - Tricuspid valve: There was no regurgitation. - Pericardium, extracardiac: A trivial, free-flowing pericardial   effusion was identified circumferential to the heart.  Nuclear stress test1/29/17 IMPRESSION: 1. No reversible ischemia or infarction. 2. Normal left ventricular wall motion. 3. Left ventricular ejection fraction 55% 4. Intermediate-risk stress test findings*.  Nuclear stress test4/14/16 Poss small inferobasal infarct, no ischemia, EF 50, Low Risk  Cardiac Catheterization3/30/15 LAD mid 40-50, ostial D1 70-75 (FFR normal) LCx irregs RCA mid stent patent with 30-40 ISR  Nuclear stress test3/2/15 IMPRESSION: Moderate inferior wall infarct from apex to base with no ischemia EF 47%  Event monitor 10/14 NSR, isolated PVC   Laboratory Data:  Chemistry Recent Labs  Lab 01/20/18 2036 01/21/18 0250 01/21/18 0858  NA 137  --  137  K 3.7  --  4.5  CL 101  --  106  CO2 26  --  26  GLUCOSE 171*  --  133*  BUN 40*  --  40*  CREATININE 0.95 0.92 0.94  CALCIUM 8.4*  --  8.1*  GFRNONAA >60 >60 >60  GFRAA >60 >60 >60  ANIONGAP 10  --  5    Recent Labs  Lab 01/20/18 2036  PROT 6.5  ALBUMIN 3.0*  AST 45*  ALT 36  ALKPHOS 76  BILITOT 1.7*   Hematology Recent Labs  Lab 01/20/18 2036 01/21/18 0250 01/21/18 0858  WBC 16.9* 14.7* 12.7*  RBC 4.61 4.08* 3.74*  HGB 13.6 12.1* 11.2*  HCT 41.3 36.4* 33.7*  MCV 89.7 89.2 90.2  MCH 29.5 29.8 30.0  MCHC 32.8 33.4 33.2  RDW 13.9 14.1 13.9  PLT 258 234 217   Cardiac Enzymes Recent Labs  Lab 01/20/18 2036 01/21/18 0250 01/21/18 0858  TROPONINI 0.25* 0.21* 0.17*   No results for input(s): TROPIPOC in the last 168 hours.  BNPNo results for input(s): BNP, PROBNP in the last 168 hours.  DDimer No results for input(s): DDIMER in the last 168 hours.  Radiology/Studies:  Ct Head Wo Contrast  Result Date: 01/20/2018 IMPRESSION: Head CT: No acute or traumatic finding. Atrophy and  chronic small-vessel ischemic changes. Cervical spine CT: No acute or traumatic finding. Previous ACDF C3 through C7. Arthropathy at the C1-2 articulation which results in encroachment upon the spinal canal that could impinge upon the cervical spinal cord. This is a chronic finding. 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 Electronically Signed   By: Nelson Chimes M.D.   On: 01/20/2018 21:42   Ct Cervical Spine Wo Contrast  Result Date: 01/20/2018 IMPRESSION: Head CT: No acute or traumatic finding. Atrophy and chronic small-vessel ischemic changes. Cervical spine CT: No acute or traumatic finding. Previous ACDF C3 through C7. Arthropathy at the C1-2 articulation which results in encroachment upon the spinal canal that could impinge upon the cervical spinal  cord. This is a chronic finding. 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 Electronically Signed   By: Nelson Chimes M.D.   On: 01/20/2018 21:42   Dg Chest Port 1 View  Result Date: 01/21/2018 IMPRESSION: Shallow inspiration.  No evidence of active pulmonary disease. Electronically Signed   By: Lucienne Capers M.D.   On: 01/21/2018 01:42    Assessment and Plan:   1. New onset Afib with RVR: -Ventricular rate remains poorly controlled on Cardizem gtt at 10 mg/hr -BP soft in the low 272Z systolic -Hold Cardura, Imdur, and hydralazine to allow for added BP room for rate control -Has metoprolol 25 mg bid ordered, may need to increase frequency to q 6 hours if BP will allow -Not adequately anticoagulated, so would ideally avoid IV amiodarone for rate control as we are uncertain if he has been having episodes of PAF at home -Will give IV digoxin 0.5 mg x 1 for rate control (normal renal function) -Will need to closely monitor his digoxin level given his advanced age -CHADS2VASc at least 4 (HTN, age x 2, vascular disease) -If MRI of the brain is negative for acute stroke, would start heparin gtt with plans for transition to [place] prior to  discharge -If ventricular rate continues to be difficult to control he will require TEE/DCCV first of the week -Recheck potassium as specimen was hemolyzed this morning, recommendation to replete to goal > 4.0 -Check magnesium with recommendation to replete to goal > 2.0 -TSH normal -Check echo -Hold ASA  2. Elevated troponin: -Mildly elevated with an initial value and peak of 0.25 -Add on CK/MB given fall and mildly elevated CK -Given his dementia, unable to assess if he has been having chest pain -Echo pending as above -Unlikely to be a good candidate for invasive cardiac work up given his advanced age and comorbid conditions, including dementia  3. Fall vs syncope/minimally responsive episode: -Recommend MRI of the brain, defer to IM (discussed with IM via phone) -Check orthostatic vital signs (has already received IV fluids) -Monitor on telemetry, cannot rule out possible post-termination pause if he has been having PAF at home -May need outpatient cardiac monitoring -Echo as above -Consider carotid ultrasound, defer to IM -PT  4. Leukocytosis: -Per IM  5. Anemia: -Per IM -Monitor on heparin gtt  6. Hyperglycemia: -Check A1c  7. Elevated CK: -The mild elevation of his CK is not consistent with rhabdo, though he does have a pressure ulcer on his back, was found down on 4/6 with last being seen upright at his baseline on 4/2 (daughter heard him talking through the wall on 4/3, though never laid eyes on him) -IV fluids per IM  8. HLD: -Lipitor -Monitor LFTs  9. HTN: -BP on the soft side -Hold Cardura, Imdur and hydralazine as above -Monitor  10. Dementia: -Per IM   For questions or updates, please contact [facility] Please consult [email or web address] for contact info under Cardiology/STEMI.   Signed, Christell Faith, PA-C [place] Pager: [phone] 01/21/2018, 9:59 AM  Patient seen, examined. Available data reviewed. Agree with findings, assessment, and  plan as outlined by Christell Faith, PA-C.  I have reviewed radiographic and lab data as available.  The patient's family is at the bedside and most of the history is obtained from family members.  The patient states that he feels better than he did yesterday, but his ability to provide any history is very limited.  On my exam he is a pleasant elderly man in no  distress. Vitals:   01/21/18 1025 01/21/18 1115  BP: 107/69 (!) 108/92  Pulse:    Resp: (!) 22 20  Temp:    SpO2: 96% 95%   HEENT: normal except poor dentition Neck: JVP - normal, carotids 2+= without bruits Lungs: CTA bilaterally CV: tachy and irregular without murmur Abd: soft, NT, Positive BS, no hepatomegaly Ext: no C/C/E, distal pulses intact and equal Skin: warm/dry no rash  Telemetry demonstrates atrial fibrillation with RVR heart rate 150s and 160s.  The patient's background cardiovascular problems are reviewed.  I do not think he is going to tolerate escalating doses of calcium channel blockers and beta-blockers well enough to control his atrial fibrillation.  His blood pressure is marginal.  Recommend discontinuation of Cardizem drip.  Add IV amiodarone bolus and drip for rate control.  Add IV heparin in the short-term.  I had a lengthy discussion with the patient's family about pros and cons of chronic oral anticoagulation.  He will be at ongoing fall risk and it might be reasonable to anticoagulate him for 1 month if he converts to sinus rhythm, but I do not think he will be a good candidate for long-term anticoagulation.  This can be reassessed further as his clinical condition improves.  Will write orders for: IV amiodarone bolus and gtt DC dilt gtt once amiodarone started Heparin per pharmacy consult  Sherren Mocha, M.D. 01/21/2018 12:23 PM

## 2018-01-21 NOTE — Consult Note (Signed)
ANTICOAGULATION CONSULT NOTE - Initial Consult  Pharmacy Consult for Heparin Dosing and Monitoring  Indication: atrial fibrillation  Allergies  Allergen Reactions  . Budesonide-Formoterol Fumarate Swelling    Mouth and tongue swelling.  . Iohexol Shortness Of Breath and Itching    Pt was given 100cc of Omnipaque 300 followed by itching/ dyspnea.  . Simvastatin Other (See Comments)    REACTION: unknown  . Demerol [Meperidine] Other (See Comments)    REACTION: Hallucinations  . Ivp Dye [Iodinated Diagnostic Agents] Other (See Comments)    Iodine pt had a reaction when he had a CT DONE  . Meperidine Hcl Other (See Comments)    REACTION: Hallucinations  . Tape Other (See Comments)    Tears skin off, Please use "paper" tape  . Naproxen Swelling  . Pregabalin Rash  . Sulfonamide Derivatives Rash    Patient Measurements: Weight: 148 lb (67.1 kg)  Vital Signs: Temp: 98.9 F (37.2 C) (04/07 0830) Temp Source: Oral (04/07 0341) BP: 108/92 (04/07 1115) Pulse Rate: 64 (04/07 0715)  Labs: Recent Labs    01/20/18 2036 01/21/18 0250 01/21/18 0858 01/21/18 1251  HGB 13.6 12.1* 11.2*  --   HCT 41.3 36.4* 33.7*  --   PLT 258 234 217  --   APTT  --   --   --  37*  LABPROT  --   --   --  16.2*  INR  --   --   --  1.31  CREATININE 0.95 0.92 0.94  --   CKTOTAL 467*  --   --   --   CKMB  --   --   --  6.5*  TROPONINI 0.25* 0.21* 0.17* 0.19*    Estimated Creatinine Clearance: 55.5 mL/min (by C-G formula based on SCr of 0.94 mg/dL).   Medical History: Past Medical History:  Diagnosis Date  . Arthritis    "hips, back" (06/17/2015)  . Asthma   . Chronic chest pain   . Chronic lower back pain   . Coronary artery disease    a. s/p BMS to RCA in 2002; b. s/p cutting balloon POBA ;   c. cath 6/12: oDx 80% (treated with repeat cutting balloon POBA), mLAD 50% with 30-40% at Dx, CFX 30%, pRCA 25% with patent stents;  d.  Lex MV 4/14:  Low Risk - EF 61%, inf scar with peri-infarct  ischemia  . Dyspnea    chronic  . GERD (gastroesophageal reflux disease)    h/o esophageal spasm  . Headache   . History of blood transfusion    "related to OR"  . History of hiatal hernia   . Hyperlipidemia   . Hypertension   . Melanoma of lower back ([place]) late 1990's  . Memory loss   . Myocardial infarction ([place]) 2001   x 1, confirned 1 possible     Assessment: Pharmacy consulted for heparin dosing and monitoring in 84 yo male for A. Fib.   Goal of Therapy:  Heparin level 0.3-0.7 units/ml Monitor platelets by anticoagulation protocol: Yes   Plan:  Baseline labs ordered Give 4000 units bolus x 1 Start heparin infusion at 1000 units/hr Check anti-Xa level in 6 hours and daily while on heparin Continue to monitor H&H and platelets  Pernell Dupre, PharmD, BCPS Clinical Pharmacist 01/21/2018 1:51 PM

## 2018-01-21 NOTE — Progress Notes (Signed)
Pt to go to mri. Nurse will accompany him.will  Initiate amiodarone gtt and heparin gtt (if no bleeding noted) upon return.

## 2018-01-21 NOTE — Plan of Care (Signed)
  Problem: Clinical Measurements: Goal: Ability to maintain clinical measurements within normal limits will improve Outcome: Progressing Goal: Cardiovascular complication will be avoided Outcome: Progressing   Problem: Clinical Measurements: Goal: Cardiovascular complication will be avoided Outcome: Progressing

## 2018-01-21 NOTE — Progress Notes (Signed)
Notified MD. Requesting IV pain medication. Orders placed. Will continue to monitor and assess.

## 2018-01-21 NOTE — ED Notes (Signed)
Patient back cleaned with sterile water, xeroform placed and dressed.

## 2018-01-21 NOTE — Care Management Obs Status (Signed)
[facility] NOTIFICATION   Patient Details  Name: BRODERIC BARA MRN: [id number] Date of Birth: [birth year]   Medicare Observation Status Notification Given:  No   Admitted in less than 24 hours of being placed in observation  Katrina Stack, RN 01/21/2018, 3:32 PM

## 2018-01-22 DIAGNOSIS — R4189 Other symptoms and signs involving cognitive functions and awareness: Secondary | ICD-10-CM

## 2018-01-22 DIAGNOSIS — I480 Paroxysmal atrial fibrillation: Principal | ICD-10-CM

## 2018-01-22 DIAGNOSIS — E440 Moderate protein-calorie malnutrition: Secondary | ICD-10-CM

## 2018-01-22 DIAGNOSIS — L89154 Pressure ulcer of sacral region, stage 4: Secondary | ICD-10-CM

## 2018-01-22 LAB — HEPARIN LEVEL (UNFRACTIONATED)
Heparin Unfractionated: <0.1 IU/mL — ABNORMAL LOW (ref 0.30–0.70)
Heparin Unfractionated: 0.16 IU/mL — ABNORMAL LOW (ref 0.30–0.70)
Heparin Unfractionated: <0.1 IU/mL — ABNORMAL LOW (ref 0.30–0.70)

## 2018-01-22 LAB — CBC
HEMATOCRIT: 30.4 % — AB (ref 40.0–52.0)
HEMOGLOBIN: 10.1 g/dL — AB (ref 13.0–18.0)
MCH: 30.1 pg (ref 26.0–34.0)
MCHC: 33.4 g/dL (ref 32.0–36.0)
MCV: 90.2 fL (ref 80.0–100.0)
Platelets: 214 10*3/uL (ref 150–440)
RBC: 3.37 MIL/uL — ABNORMAL LOW (ref 4.40–5.90)
RDW: 14.1 % (ref 11.5–14.5)
WBC: 11.1 10*3/uL — ABNORMAL HIGH (ref 3.8–10.6)

## 2018-01-22 LAB — URINE CULTURE: CULTURE: NO GROWTH

## 2018-01-22 MED ORDER — HEPARIN BOLUS VIA INFUSION
2000.0000 [IU] | Freq: Once | INTRAVENOUS | Status: AC
  Administered 2018-01-22: 2000 [IU] via INTRAVENOUS
  Filled 2018-01-22: qty 2000

## 2018-01-22 MED ORDER — COLLAGENASE 250 UNIT/GM EX OINT
TOPICAL_OINTMENT | Freq: Two times a day (BID) | CUTANEOUS | Status: DC
  Administered 2018-01-22 – 2018-01-26 (×7): via TOPICAL
  Filled 2018-01-22: qty 30

## 2018-01-22 MED ORDER — SODIUM CHLORIDE 0.9 % IV SOLN
INTRAVENOUS | Status: AC
Start: 2018-01-22 — End: 2018-01-23
  Administered 2018-01-22: 14:00:00 via INTRAVENOUS

## 2018-01-22 MED ORDER — LORAZEPAM 2 MG/ML IJ SOLN
0.5000 mg | Freq: Four times a day (QID) | INTRAMUSCULAR | Status: DC | PRN
  Administered 2018-01-22 – 2018-01-24 (×4): 0.5 mg via INTRAVENOUS
  Filled 2018-01-22 (×4): qty 1

## 2018-01-22 NOTE — Progress Notes (Signed)
PT Cancellation Note  Patient Details Name: JALEEN FINCH MRN: [id number] DOB: 1930-11-23   Cancelled Treatment:    Reason Eval/Treat Not Completed: Medical issues which prohibited therapy; Pt's HR remains elevated with amioderone IV initiated this AM.  Will hold PT services this date and attempt to see pt tomorrow as medically appropriate.     Linus Salmons PT, DPT 01/22/18, 12:55 PM

## 2018-01-22 NOTE — Consult Note (Signed)
[facility] Nurse wound consult note Reason for Consult:Contact dermatitis and tissue damage/loss due to prolonged exposure with detergent/soap (according to family).  Dawn (brand-name) dish washing detergent found on floor next to and beneath patient. Patient was found "down" at home for "at least a day" (according to family).  Pressure injury (POA) to coccygeal area and left heel are related skin injuries. Full and partial thickness tissue damage/loss to back and shoulders (L>R) from prolonged contact with detergent/substance. Wound type: contact dermatitis, pressure, incontinence Pressure Injury POA: Yes Measurement:  Left heel: non-blanching erythema (Stage 1 PrI) measuring 7cm x 6cm. No exudate. Coccygeal Pressure Injury:  6.8cm x 8cm area with 5cm x 4cm purple/black eschar at left center, this is early eschar formation over full thickness pressure injury (Unstageable). Partial thickness tissue loss surrounds this injured area.Scant serous exudate. Full and partial thickness tissue injury to left upper shoulder and back with soft, movable and autolytically dissolving eschar measuring 4cm x 7cm.  Total area of partial thickness tissue loss measuring 14cm x 40cm x 0.2cm.  Moist, pink wound bed, moderate amount of serous to light blue exudate (possible detergent by product). Wound bed: As described above Drainage (amount, consistency, odor): As described above Periwound: Skin is intact and dry. Scrotum with IAD, erythematous, no break in the skin Dressing procedure/placement/frequency: Patient is on a low bed for fall precautions.  He can be turned and repositioned from side to side.  Bilateral Heel boots are provided for treatment of left heel and for right heel pressure injury prevention.  Topical wound care to IAD is in place, patent is being cleansed following incontinence episodes with house skin cleansing and moisturizing cloths.that leave a moisture barrier residue. Topical care for the chemical injury  to the back and shoulders will be twice daily with NS cleanse and placement of xeroform wound contact layer dressing (antimicrobial and astringent properties) with extra large ABDs to cover and Kerlix to secure. The coccygeal area Unstageable pressure injury will be treated with an enzymatic debriding agent (collagenase (Santyl) topped with NS dressings and changed twice daily. Once assured that patient is a low fall risk, a mattress replacement with low air loss feature would be considered as an additional treatment modality. As patient was admitted s/p fall, we much instead select the low bed option with frequent turning and repositioning as the support intervention.  [facility] nursing team will not follow routinely, but will remain available to this patient, the nursing and medical teams.  Please re-consult if needed. Thanks, Maudie Flakes, MSN, RN, [facility], [facility]  Pager# [phone]

## 2018-01-22 NOTE — Progress Notes (Signed)
[facility] [place] NAME: [name]    MR#:  [id number]  DATE OF BIRTH:  1930/12/13  SUBJECTIVE:  CHIEF COMPLAINT:   Chief Complaint  Patient presents with  . Fall   - fall at home, severe pressure wounds on left shoulder - agitated, complains of dysuria  REVIEW OF SYSTEMS:  Review of Systems  Unable to perform ROS: Dementia    DRUG ALLERGIES:   Allergies  Allergen Reactions  . Budesonide-Formoterol Fumarate Swelling    Mouth and tongue swelling.  . Iohexol Shortness Of Breath and Itching    Pt was given 100cc of Omnipaque 300 followed by itching/ dyspnea.  . Simvastatin Other (See Comments)    REACTION: unknown  . Demerol [Meperidine] Other (See Comments)    REACTION: Hallucinations  . Ivp Dye [Iodinated Diagnostic Agents] Other (See Comments)    Iodine pt had a reaction when he had a CT DONE  . Meperidine Hcl Other (See Comments)    REACTION: Hallucinations  . Tape Other (See Comments)    Tears skin off, Please use "paper" tape  . Naproxen Swelling  . Pregabalin Rash  . Sulfonamide Derivatives Rash    VITALS:  Blood pressure (!) 155/82, pulse 71, temperature 97.7 F (36.5 C), temperature source Oral, resp. rate 18, weight 67.1 kg (148 lb), SpO2 94 %.  PHYSICAL EXAMINATION:  Physical Exam  GENERAL:  84 y.o.-year-old elderly patient lying in the bed with no acute distress.  EYES: Pupils equal, round, reactive to light and accommodation. No scleral icterus. Extraocular muscles intact.  HEENT: Head atraumatic, normocephalic. Oropharynx and nasopharynx clear.  NECK:  Supple, no jugular venous distention. No thyroid enlargement, no tenderness.  LUNGS: Normal breath sounds bilaterally, no wheezing, rales,rhonchi or crepitation. No use of accessory muscles of respiration.  CARDIOVASCULAR: S1, S2 normal. No  rubs, or gallops. 2/6 systolic murmur present ABDOMEN: Soft, nontender, nondistended. Bowel sounds present. No  organomegaly or mass.  EXTREMITIES: sloughing off skin and open wounds posterior left shoulder -No pedal edema, cyanosis, or clubbing.  NEUROLOGIC: Cranial nerves II through XII are intact. Muscle strength 4/5 in all extremities. Sensation intact. Gait not checked. Global weakness PSYCHIATRIC: The patient is alert. Restless in bed SKIN: No obvious rash, lesion, or ulcer.    LABORATORY PANEL:   CBC Recent Labs  Lab 01/22/18 0142  WBC 11.1*  HGB 10.1*  HCT 30.4*  PLT 214   ------------------------------------------------------------------------------------------------------------------  Chemistries  Recent Labs  Lab 01/20/18 2036  01/21/18 0858 01/21/18 1251  NA 137  --  137  --   K 3.7  --  4.5 3.8  CL 101  --  106  --   CO2 26  --  26  --   GLUCOSE 171*  --  133*  --   BUN 40*  --  40*  --   CREATININE 0.95   < > 0.94  --   CALCIUM 8.4*  --  8.1*  --   MG  --   --   --  2.2  AST 45*  --   --   --   ALT 36  --   --   --   ALKPHOS 76  --   --   --   BILITOT 1.7*  --   --   --    < > = values in this interval not displayed.   ------------------------------------------------------------------------------------------------------------------  Cardiac Enzymes Recent Labs  Lab 01/21/18 1251  TROPONINI 0.19*   ------------------------------------------------------------------------------------------------------------------  RADIOLOGY:  Ct Head Wo Contrast  Result Date: 01/20/2018 CLINICAL DATA:  Found on the floor.  Dementia. EXAM: CT HEAD WITHOUT CONTRAST CT CERVICAL SPINE WITHOUT CONTRAST TECHNIQUE: Multidetector CT imaging of the head and cervical spine was performed following the standard protocol without intravenous contrast. Multiplanar CT image reconstructions of the cervical spine were also generated. COMPARISON:  07/31/2017 FINDINGS: CT HEAD FINDINGS Brain: Generalized atrophy. Chronic small-vessel ischemic changes of the hemispheric white matter. No sign of  acute infarction, mass lesion, hemorrhage, hydrocephalus or extra-axial collection. Vascular: There is atherosclerotic calcification of the major vessels at the base of the brain. Skull: Normal Sinuses/Orbits: Clear/normal Other: None CT CERVICAL SPINE FINDINGS Alignment: Normal Skull base and vertebrae: Chronic arthropathy at the C1-2 articulation with moderate encroachment upon the spinal canal. Some potential for up or cervical cord impingement. Soft tissues and spinal canal: Negative Disc levels: Chronic degenerative spondylosis at C2-3 but without compressive stenosis. Previous ACDF C3 through C7 appears solid with sufficient patency of the canal and foramina. C7-T1 shows mild disc degeneration. Upper chest: Negative Other: None IMPRESSION: Head CT: No acute or traumatic finding. Atrophy and chronic small-vessel ischemic changes. Cervical spine CT: No acute or traumatic finding. Previous ACDF C3 through C7. Arthropathy at the C1-2 articulation which results in encroachment upon the spinal canal that could impinge upon the cervical spinal cord. This is a chronic finding. 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 Electronically Signed   By: Nelson Chimes M.D.   On: 01/20/2018 21:42   Ct Cervical Spine Wo Contrast  Result Date: 01/20/2018 CLINICAL DATA:  Found on the floor.  Dementia. EXAM: CT HEAD WITHOUT CONTRAST CT CERVICAL SPINE WITHOUT CONTRAST TECHNIQUE: Multidetector CT imaging of the head and cervical spine was performed following the standard protocol without intravenous contrast. Multiplanar CT image reconstructions of the cervical spine were also generated. COMPARISON:  07/31/2017 FINDINGS: CT HEAD FINDINGS Brain: Generalized atrophy. Chronic small-vessel ischemic changes of the hemispheric white matter. No sign of acute infarction, mass lesion, hemorrhage, hydrocephalus or extra-axial collection. Vascular: There is atherosclerotic calcification of the major vessels at the base of the brain. Skull:  Normal Sinuses/Orbits: Clear/normal Other: None CT CERVICAL SPINE FINDINGS Alignment: Normal Skull base and vertebrae: Chronic arthropathy at the C1-2 articulation with moderate encroachment upon the spinal canal. Some potential for up or cervical cord impingement. Soft tissues and spinal canal: Negative Disc levels: Chronic degenerative spondylosis at C2-3 but without compressive stenosis. Previous ACDF C3 through C7 appears solid with sufficient patency of the canal and foramina. C7-T1 shows mild disc degeneration. Upper chest: Negative Other: None IMPRESSION: Head CT: No acute or traumatic finding. Atrophy and chronic small-vessel ischemic changes. Cervical spine CT: No acute or traumatic finding. Previous ACDF C3 through C7. Arthropathy at the C1-2 articulation which results in encroachment upon the spinal canal that could impinge upon the cervical spinal cord. This is a chronic finding. 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 Electronically Signed   By: Nelson Chimes M.D.   On: 01/20/2018 21:42   Mr Brain Wo Contrast  Result Date: 01/21/2018 CLINICAL DATA:  Episode of unresponsiveness.  Found on the floor. EXAM: MRI HEAD WITHOUT CONTRAST TECHNIQUE: Multiplanar, multiecho pulse sequences of the brain and surrounding structures were obtained without intravenous contrast. COMPARISON:  CT 01/20/2018.  MRI 06/19/2017. FINDINGS: Brain: The examination suffers from motion degradation. There is no sign of acute or subacute infarction. The brainstem and cerebellum are normal. Cerebral hemispheres show atrophy with  moderate chronic small-vessel ischemic changes of the deep white matter. No cortical or large vessel territory abnormality. No mass lesion, hemorrhage, hydrocephalus or extra-axial collection. Vascular: Major vessels at the base of the brain show flow. Skull and upper cervical spine: Negative Sinuses/Orbits: Clear/normal Other: None IMPRESSION: No acute finding by MRI. Motion degraded exam showing atrophy  and chronic small-vessel ischemic changes of the white matter. Electronically Signed   By: Nelson Chimes M.D.   On: 01/21/2018 16:10   Dg Chest Port 1 View  Result Date: 01/21/2018 CLINICAL DATA:  Patient was found on the floor, down for an unknown amount of time. History of dementia. EXAM: PORTABLE CHEST 1 VIEW COMPARISON:  07/31/2017 FINDINGS: Shallow inspiration. Heart size and pulmonary vascularity are normal. Lungs are clear and expanded. No pneumothorax. No pleural effusions. Calcification of the aorta. Postoperative changes in the cervical spine. IMPRESSION: Shallow inspiration.  No evidence of active pulmonary disease. Electronically Signed   By: Lucienne Capers M.D.   On: 01/21/2018 01:42    EKG:   Orders placed or performed during the hospital encounter of 01/20/18  . EKG 12-Lead  . EKG 12-Lead  . EKG 12-Lead  . EKG 12-Lead  . EKG 12-Lead  . EKG 12-Lead  . EKG    ASSESSMENT AND PLAN:   84 year old male with past medical history significant for CAD, dementia, GI bleed history, hypertension presents to the hospital secondary to fall and noted to have A. Fib with RVR  1. Syncope/fall-according to family, probably mechanical fall and due to weakness patient couldn't get up. -Appreciate cardiology consult. -MRI of the brain negative for any acute findings. -Patient noted to have paroxysmal atrial fibrillation in the setting of rhabdomyolysis  2. Paroxysmal atrial fibrillation-appreciate cardiology consult -Due to difficulty in controlling the rate, currently on AMIODARONE> Also IV heparin drip for anticoagulation.also on Cardizem IV -Not a good candidate for long-term anticoagulation  3. Fall and posterior skin wounds-has significant sloughing off wounds from pressure injury. Wound care consult and topical treatment.  4. GERD-on Protonix  5. DVT prophylaxis-on heparin drip   Physical therapy consulted Updated Family at bedside   All the records are reviewed and case  discussed with Care Management/Social Workerr. Management plans discussed with the patient, family and they are in agreement.  CODE STATUS: DNR  TOTAL TIME TAKING CARE OF THIS PATIENT: 38 minutes.   POSSIBLE D/C IN 2-3 DAYS, DEPENDING ON CLINICAL CONDITION.   Gladstone Lighter M.D on 01/22/2018 at 3:30 PM  Between 7am to 6pm - Pager - [phone]  After 6pm go to [email or web address] - password [email or web address] [facility]  Office  [phone]  CC: Primary care physician; [name], FNP

## 2018-01-22 NOTE — Consult Note (Signed)
ANTICOAGULATION CONSULT NOTE - Initial Consult  Pharmacy Consult for Heparin Dosing and Monitoring  Indication: atrial fibrillation  Allergies  Allergen Reactions  . Budesonide-Formoterol Fumarate Swelling    Mouth and tongue swelling.  . Iohexol Shortness Of Breath and Itching    Pt was given 100cc of Omnipaque 300 followed by itching/ dyspnea.  . Simvastatin Other (See Comments)    REACTION: unknown  . Demerol [Meperidine] Other (See Comments)    REACTION: Hallucinations  . Ivp Dye [Iodinated Diagnostic Agents] Other (See Comments)    Iodine pt had a reaction when he had a CT DONE  . Meperidine Hcl Other (See Comments)    REACTION: Hallucinations  . Tape Other (See Comments)    Tears skin off, Please use "paper" tape  . Naproxen Swelling  . Pregabalin Rash  . Sulfonamide Derivatives Rash    Patient Measurements: Weight: 148 lb (67.1 kg)  Heparin dosing weight: 67 kg  Vital Signs: Temp: 97.7 F (36.5 C) (04/07 1620) Temp Source: Oral (04/07 1620) BP: 133/63 (04/07 2354) Pulse Rate: 63 (04/07 2354)  Labs: Recent Labs    01/20/18 2036 01/21/18 0250 01/21/18 0858 01/21/18 1251 01/22/18 0142  HGB 13.6 12.1* 11.2*  --  10.1*  HCT 41.3 36.4* 33.7*  --  30.4*  PLT 258 234 217  --  214  APTT  --   --   --  37*  --   LABPROT  --   --   --  16.2*  --   INR  --   --   --  1.31  --   HEPARINUNFRC  --   --   --   --  <0.10*  CREATININE 0.95 0.92 0.94  --   --   CKTOTAL 467*  --   --   --   --   CKMB  --   --   --  6.5*  --   TROPONINI 0.25* 0.21* 0.17* 0.19*  --     Estimated Creatinine Clearance: 55.5 mL/min (by C-G formula based on SCr of 0.94 mg/dL).   Medical History: Past Medical History:  Diagnosis Date  . Arthritis    "hips, back" (06/17/2015)  . Asthma   . Chronic chest pain   . Chronic lower back pain   . Coronary artery disease    a. s/p BMS to RCA in 2002; b. s/p cutting balloon POBA ;   c. cath 6/12: oDx 80% (treated with repeat cutting balloon  POBA), mLAD 50% with 30-40% at Dx, CFX 30%, pRCA 25% with patent stents;  d.  Lex MV 4/14:  Low Risk - EF 61%, inf scar with peri-infarct ischemia  . Dyspnea    chronic  . GERD (gastroesophageal reflux disease)    h/o esophageal spasm  . Headache   . History of blood transfusion    "related to OR"  . History of hiatal hernia   . Hyperlipidemia   . Hypertension   . Melanoma of lower back ([place]) late 1990's  . Memory loss   . Myocardial infarction ([place]) 2001   x 1, confirned 1 possible     Assessment: Pharmacy consulted for heparin dosing and monitoring in 84 yo male for A. Fib.   Goal of Therapy:  Heparin level 0.3-0.7 units/ml Monitor platelets by anticoagulation protocol: Yes   Plan:  Baseline labs ordered Give 4000 units bolus x 1 Start heparin infusion at 1000 units/hr Check anti-Xa level in 6 hours and daily while on heparin Continue  to monitor H&H and platelets   04/08 0130 heparin level <0.1. 2000 unit bolus and increase rate to 1250 units/hr.   Eloise Harman, PharmD, BCPS Clinical Pharmacist 01/22/2018 2:21 AM

## 2018-01-22 NOTE — Progress Notes (Signed)
Notified Md of Pt in SR/SBrady. Orders placed to stop drip. Will continue to monitor and assess.

## 2018-01-22 NOTE — Consult Note (Addendum)
ANTICOAGULATION CONSULT NOTE - Initial Consult  Pharmacy Consult for Heparin Dosing and Monitoring  Indication: atrial fibrillation  Allergies  Allergen Reactions  . Budesonide-Formoterol Fumarate Swelling    Mouth and tongue swelling.  . Iohexol Shortness Of Breath and Itching    Pt was given 100cc of Omnipaque 300 followed by itching/ dyspnea.  . Simvastatin Other (See Comments)    REACTION: unknown  . Demerol [Meperidine] Other (See Comments)    REACTION: Hallucinations  . Ivp Dye [Iodinated Diagnostic Agents] Other (See Comments)    Iodine pt had a reaction when he had a CT DONE  . Meperidine Hcl Other (See Comments)    REACTION: Hallucinations  . Tape Other (See Comments)    Tears skin off, Please use "paper" tape  . Naproxen Swelling  . Pregabalin Rash  . Sulfonamide Derivatives Rash    Patient Measurements: Weight: 148 lb (67.1 kg)  Heparin dosing weight: 67 kg  Vital Signs: Temp: 97.7 F (36.5 C) (04/08 0746) Temp Source: Oral (04/08 0746) BP: 155/82 (04/08 0746) Pulse Rate: 71 (04/08 0746)  Labs: Recent Labs    01/20/18 2036 01/21/18 0250 01/21/18 0858 01/21/18 1251 01/22/18 0142 01/22/18 1110  HGB 13.6 12.1* 11.2*  --  10.1*  --   HCT 41.3 36.4* 33.7*  --  30.4*  --   PLT 258 234 217  --  214  --   APTT  --   --   --  37*  --   --   LABPROT  --   --   --  16.2*  --   --   INR  --   --   --  1.31  --   --   HEPARINUNFRC  --   --   --   --  <0.10* 0.16*  CREATININE 0.95 0.92 0.94  --   --   --   CKTOTAL 467*  --   --   --   --   --   CKMB  --   --   --  6.5*  --   --   TROPONINI 0.25* 0.21* 0.17* 0.19*  --   --     Estimated Creatinine Clearance: 55.5 mL/min (by C-G formula based on SCr of 0.94 mg/dL).   Medical History: Past Medical History:  Diagnosis Date  . Arthritis    "hips, back" (06/17/2015)  . Asthma   . Chronic chest pain   . Chronic lower back pain   . Coronary artery disease    a. s/p BMS to RCA in 2002; b. s/p cutting balloon  POBA ;   c. cath 6/12: oDx 80% (treated with repeat cutting balloon POBA), mLAD 50% with 30-40% at Dx, CFX 30%, pRCA 25% with patent stents;  d.  Lex MV 4/14:  Low Risk - EF 61%, inf scar with peri-infarct ischemia  . Dyspnea    chronic  . GERD (gastroesophageal reflux disease)    h/o esophageal spasm  . Headache   . History of blood transfusion    "related to OR"  . History of hiatal hernia   . Hyperlipidemia   . Hypertension   . Melanoma of lower back ([place]) late 1990's  . Memory loss   . Myocardial infarction ([place]) 2001   x 1, confirned 1 possible     Assessment: Pharmacy consulted for heparin dosing and monitoring in 84 yo male for A. Fib.   Goal of Therapy:  Heparin level 0.3-0.7 units/ml Monitor platelets by anticoagulation  protocol: Yes   Plan:  Baseline labs ordered Give 4000 units bolus x 1 Start heparin infusion at 1000 units/hr Check anti-Xa level in 6 hours and daily while on heparin Continue to monitor H&H and platelets   04/08 0130 heparin level <0.1. 2000 unit bolus and increase rate to 1250 units/hr.   04/08 1100 Heparin level 0.16. Nurse confirmed that there has not been any interruption in heparin therapy during her shift. Will order 2000 unit bolus and increase infusion to 1400u/hr. Recheck HL in 8 hours.    Pernell Dupre, PharmD, BCPS Clinical Pharmacist 01/22/2018 11:59 AM

## 2018-01-22 NOTE — Progress Notes (Signed)
Progress Note  Patient Name: [name] Date of Encounter: 01/22/2018  Primary Cardiologist: Dorris Carnes, MD  Subjective   Patient pleasant but somewhat incoherent and disoriented.  He is following commands.  Family at bedside.  He had paroxysmal A. fib throughout the night and is currently in sinus rhythm after having converted back at about 1215 this afternoon.  Inpatient Medications    Scheduled Meds: . aspirin EC  81 mg Oral Daily  . atorvastatin  80 mg Oral Daily  . collagenase   Topical BID  . feeding supplement (ENSURE ENLIVE)  237 mL Oral BID BM  . metoprolol tartrate  25 mg Oral BID  . multivitamin with minerals  1 tablet Oral Daily  . pantoprazole  40 mg Oral BID  . tamsulosin  0.4 mg Oral Daily   Continuous Infusions: . amiodarone 30 mg/hr (01/22/18 0516)  . diltiazem (CARDIZEM) infusion 10 mg/hr (01/21/18 1020)  . heparin 1,400 Units/hr (01/22/18 1238)   PRN Meds: acetaminophen **OR** acetaminophen, HYDROcodone-acetaminophen, metoprolol tartrate, morphine injection, ondansetron **OR** ondansetron (ZOFRAN) IV   Vital Signs    Vitals:   01/21/18 2354 01/22/18 0227 01/22/18 0319 01/22/18 0746  BP: 133/63 119/85 (!) 128/94 (!) 155/82  Pulse: 63 (!) 131 (!) 152 71  Resp:    18  Temp:  (!) 97.5 F (36.4 C)  97.7 F (36.5 C)  TempSrc:  Axillary  Oral  SpO2: 98% 96% 97% 94%  Weight:        Intake/Output Summary (Last 24 hours) at 01/22/2018 1240 Last data filed at 01/22/2018 0700 Gross per 24 hour  Intake -  Output 550 ml  Net -550 ml   Filed Weights   01/21/18 0224  Weight: 148 lb (67.1 kg)    Physical Exam   GEN: Somewhat frail patient discharged home with physical short, in no acute distress.  HEENT: Grossly normal.  Neck: Supple, no JVD, carotid bruits, or masses. Cardiac: RRR, distant, no murmurs, rubs, or gallops. No clubbing, cyanosis, edema.  Radials/DP/PT 1+ and equal bilaterally.  Respiratory:  Respirations regular and unlabored,  diminished breath sounds L base. GI: Soft, nontender, nondistended, BS + x 4. MS: no deformity or atrophy. Skin: large abrasion/ulceration to left upper back/shoulder area with serosanguinous drainage. Neuro:  Strength and sensation are intact. Psych: disoriented to time/place. Follows commands.  Labs    Chemistry Recent Labs  Lab 01/20/18 2036 01/21/18 0250 01/21/18 0858 01/21/18 1251  NA 137  --  137  --   K 3.7  --  4.5 3.8  CL 101  --  106  --   CO2 26  --  26  --   GLUCOSE 171*  --  133*  --   BUN 40*  --  40*  --   CREATININE 0.95 0.92 0.94  --   CALCIUM 8.4*  --  8.1*  --   PROT 6.5  --   --   --   ALBUMIN 3.0*  --   --   --   AST 45*  --   --   --   ALT 36  --   --   --   ALKPHOS 76  --   --   --   BILITOT 1.7*  --   --   --   GFRNONAA >60 >60 >60  --   GFRAA >60 >60 >60  --   ANIONGAP 10  --  5  --      Hematology Recent  Labs  Lab 01/21/18 0250 01/21/18 0858 01/22/18 0142  WBC 14.7* 12.7* 11.1*  RBC 4.08* 3.74* 3.37*  HGB 12.1* 11.2* 10.1*  HCT 36.4* 33.7* 30.4*  MCV 89.2 90.2 90.2  MCH 29.8 30.0 30.1  MCHC 33.4 33.2 33.4  RDW 14.1 13.9 14.1  PLT 234 217 214    Cardiac Enzymes Recent Labs  Lab 01/20/18 2036 01/21/18 0250 01/21/18 0858 01/21/18 1251  TROPONINI 0.25* 0.21* 0.17* 0.19*      Radiology    Ct Head Wo Contrast  Result Date: 01/20/2018 CLINICAL DATA:  Found on the floor.  Dementia. EXAM: CT HEAD WITHOUT CONTRAST CT CERVICAL SPINE WITHOUT CONTRAST TECHNIQUE: Multidetector CT imaging of the head and cervical spine was performed following the standard protocol without intravenous contrast. Multiplanar CT image reconstructions of the cervical spine were also generated. COMPARISON:  07/31/2017 FINDINGS: CT HEAD FINDINGS Brain: Generalized atrophy. Chronic small-vessel ischemic changes of the hemispheric white matter. No sign of acute infarction, mass lesion, hemorrhage, hydrocephalus or extra-axial collection. Vascular: There is  atherosclerotic calcification of the major vessels at the base of the brain. Skull: Normal Sinuses/Orbits: Clear/normal Other: None CT CERVICAL SPINE FINDINGS Alignment: Normal Skull base and vertebrae: Chronic arthropathy at the C1-2 articulation with moderate encroachment upon the spinal canal. Some potential for up or cervical cord impingement. Soft tissues and spinal canal: Negative Disc levels: Chronic degenerative spondylosis at C2-3 but without compressive stenosis. Previous ACDF C3 through C7 appears solid with sufficient patency of the canal and foramina. C7-T1 shows mild disc degeneration. Upper chest: Negative Other: None IMPRESSION: Head CT: No acute or traumatic finding. Atrophy and chronic small-vessel ischemic changes. Cervical spine CT: No acute or traumatic finding. Previous ACDF C3 through C7. Arthropathy at the C1-2 articulation which results in encroachment upon the spinal canal that could impinge upon the cervical spinal cord. This is a chronic finding. 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 Electronically Signed   By: Nelson Chimes M.D.   On: 01/20/2018 21:42   Ct Cervical Spine Wo Contrast  Result Date: 01/20/2018 CLINICAL DATA:  Found on the floor.  Dementia. EXAM: CT HEAD WITHOUT CONTRAST CT CERVICAL SPINE WITHOUT CONTRAST TECHNIQUE: Multidetector CT imaging of the head and cervical spine was performed following the standard protocol without intravenous contrast. Multiplanar CT image reconstructions of the cervical spine were also generated. COMPARISON:  07/31/2017 FINDINGS: CT HEAD FINDINGS Brain: Generalized atrophy. Chronic small-vessel ischemic changes of the hemispheric white matter. No sign of acute infarction, mass lesion, hemorrhage, hydrocephalus or extra-axial collection. Vascular: There is atherosclerotic calcification of the major vessels at the base of the brain. Skull: Normal Sinuses/Orbits: Clear/normal Other: None CT CERVICAL SPINE FINDINGS Alignment: Normal Skull base  and vertebrae: Chronic arthropathy at the C1-2 articulation with moderate encroachment upon the spinal canal. Some potential for up or cervical cord impingement. Soft tissues and spinal canal: Negative Disc levels: Chronic degenerative spondylosis at C2-3 but without compressive stenosis. Previous ACDF C3 through C7 appears solid with sufficient patency of the canal and foramina. C7-T1 shows mild disc degeneration. Upper chest: Negative Other: None IMPRESSION: Head CT: No acute or traumatic finding. Atrophy and chronic small-vessel ischemic changes. Cervical spine CT: No acute or traumatic finding. Previous ACDF C3 through C7. Arthropathy at the C1-2 articulation which results in encroachment upon the spinal canal that could impinge upon the cervical spinal cord. This is a chronic finding. 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 Electronically Signed   By: Nelson Chimes M.D.   On: 01/20/2018  21:42   Mr Brain Wo Contrast  Result Date: 01/21/2018 CLINICAL DATA:  Episode of unresponsiveness.  Found on the floor. EXAM: MRI HEAD WITHOUT CONTRAST TECHNIQUE: Multiplanar, multiecho pulse sequences of the brain and surrounding structures were obtained without intravenous contrast. COMPARISON:  CT 01/20/2018.  MRI 06/19/2017. FINDINGS: Brain: The examination suffers from motion degradation. There is no sign of acute or subacute infarction. The brainstem and cerebellum are normal. Cerebral hemispheres show atrophy with moderate chronic small-vessel ischemic changes of the deep white matter. No cortical or large vessel territory abnormality. No mass lesion, hemorrhage, hydrocephalus or extra-axial collection. Vascular: Major vessels at the base of the brain show flow. Skull and upper cervical spine: Negative Sinuses/Orbits: Clear/normal Other: None IMPRESSION: No acute finding by MRI. Motion degraded exam showing atrophy and chronic small-vessel ischemic changes of the white matter. Electronically Signed   By: Nelson Chimes  M.D.   On: 01/21/2018 16:10   Dg Chest Port 1 View  Result Date: 01/21/2018 CLINICAL DATA:  Patient was found on the floor, down for an unknown amount of time. History of dementia. EXAM: PORTABLE CHEST 1 VIEW COMPARISON:  07/31/2017 FINDINGS: Shallow inspiration. Heart size and pulmonary vascularity are normal. Lungs are clear and expanded. No pneumothorax. No pleural effusions. Calcification of the aorta. Postoperative changes in the cervical spine. IMPRESSION: Shallow inspiration.  No evidence of active pulmonary disease. Electronically Signed   By: Lucienne Capers M.D.   On: 01/21/2018 01:42    Telemetry    In and out of afib overnight.  Currently in sinus (last converted out of afib ~ 12:15 pm) - Personally Reviewed  Cardiac Studies   2D Echocardiogram 9.2018  Study Conclusions  - Left ventricle: The cavity size was normal. There was moderate   concentric hypertrophy. Systolic function was normal. The   estimated ejection fraction was in the range of 55% to 60%. Wall   motion was normal; there were no regional wall motion   abnormalities. Doppler parameters are consistent with abnormal   left ventricular relaxation (grade 1 diastolic dysfunction). - Aortic valve: Transvalvular velocity was within the normal range.   There was no stenosis. There was no regurgitation. - Mitral valve: Transvalvular velocity was within the normal range.   There was no evidence for stenosis. There was no regurgitation.   Valve area by pressure half-time: 2.37 cm^2. Valve area by   continuity equation (using LVOT flow): 1.58 cm^2. - Left atrium: The atrium was mildly dilated. - Right ventricle: The cavity size was normal. Wall thickness was   normal. Systolic function was normal. - Atrial septum: No defect or patent foramen ovale was identified. - Tricuspid valve: There was no regurgitation. - Pericardium, extracardiac: A trivial, free-flowing pericardial   effusion was identified circumferential to  the heart.  Patient Profile     84 y.o. male with a hx of CAD s/p stent to the RCA and cutting balloon angioplasty to the diagonal, dementia, chronic chest pain, GI bleed felt to possibly be diverticular, orthostatic hypotension, hypertension, hyperlipidemia, GERD who is being seen today for the evaluation of new onset Afib with RVR and possible fall vs syncope (found on floor - duration of time unknown but significant skin break down on coccyx and left upper back).  Assessment & Plan    1.  Paroxysmal atrial fibrillation: In the setting of mild rhabdomyolysis.  Patient currently on IV amiodarone and tolerating well but has been back and forth between rapid A. fib in sinus rhythm.  Currently in sinus rhythm.  Given ongoing paroxysms, would continue IV amiodarone the time being.  Currently on heparin as well.  CHA2DS2VASc equals at least 4.  Patient previously was living by himself though family says they will be staying with him at time of discharge.  With advancing dementia, prior history of diverticular bleeding, and suspected fall, he may not be the best candidate for oral anticoagulation going forward.  Will discuss further with Dr. Fletcher Anon.  2.  Rhabdomyolysis: Patient found down by family.  Unclear as to how long he was on the floor and his home though he was noted to have abrasions/ulcerations to his coccyx and left upper back.  That said, CK only in the 400 range with CK-MB of 6.5.  In that setting, troponin also mildly elevated with a current peak of 0.25.  Hemodynamically stable.  3.Coronary artery disease/elevated troponin: No complaints of chest pain.  Prior history of stenting with  nonischemic stress testing in January 2017.  Troponin mildly elevated in the setting of above, peaking at 0.25.  Would not pursue ischemic evaluation at this time given comorbidities and acute confusion with advancing dementia.  Continue beta-blocker.   Signed, Murray Hodgkins, NP  01/22/2018, 12:40 PM    For  questions or updates, please contact   Please consult [email or web address] for contact info under Cardiology/STEMI.

## 2018-01-22 NOTE — Care Management (Signed)
Patient initially placed in observation and then admitted a few hours later. he required cardizem drip to control what appears to be new onset of atrial fib with RVR. During progression discussed concerns with CSW that patient lived alone and has dementia.  Family members live close by but it is unknown how long patient had been down on the floor.There are concerns addressed whether patient was receiving enough supervision .  Patient had areas of skin breakdown on shoulder and sacral area.  PT consult pending

## 2018-01-22 NOTE — Progress Notes (Signed)
Notified MD of Pt's heartrate climbing back into the 130's and a-fib. Orders to restart the amiodarone @ 16.7. Will continue to monitor and assess.

## 2018-01-23 ENCOUNTER — Inpatient Hospital Stay (HOSPITAL_COMMUNITY)
Admit: 2018-01-23 | Discharge: 2018-01-23 | Disposition: A | Discharge status: Home / Self Care | Payer: Medicare Other | Attending: Internal Medicine | Admitting: Internal Medicine

## 2018-01-23 DIAGNOSIS — I248 Other forms of acute ischemic heart disease: Secondary | ICD-10-CM

## 2018-01-23 DIAGNOSIS — I951 Orthostatic hypotension: Secondary | ICD-10-CM

## 2018-01-23 DIAGNOSIS — I10 Essential (primary) hypertension: Secondary | ICD-10-CM

## 2018-01-23 DIAGNOSIS — I2510 Atherosclerotic heart disease of native coronary artery without angina pectoris: Secondary | ICD-10-CM

## 2018-01-23 LAB — BASIC METABOLIC PANEL
Anion gap: 4 — ABNORMAL LOW (ref 5–15)
BUN: 35 mg/dL — AB (ref 6–20)
CO2: 27 mmol/L (ref 22–32)
CREATININE: 0.76 mg/dL (ref 0.61–1.24)
Calcium: 8.2 mg/dL — ABNORMAL LOW (ref 8.9–10.3)
Chloride: 109 mmol/L (ref 101–111)
Glucose, Bld: 149 mg/dL — ABNORMAL HIGH (ref 65–99)
Potassium: 3.8 mmol/L (ref 3.5–5.1)
SODIUM: 140 mmol/L (ref 135–145)

## 2018-01-23 LAB — HEPARIN LEVEL (UNFRACTIONATED)
HEPARIN UNFRACTIONATED: 0.84 [IU]/mL — AB (ref 0.30–0.70)
Heparin Unfractionated: 0.85 IU/mL — ABNORMAL HIGH (ref 0.30–0.70)
Heparin Unfractionated: 0.59 IU/mL (ref 0.30–0.70)

## 2018-01-23 LAB — ECHOCARDIOGRAM COMPLETE: Weight: 2368 oz

## 2018-01-23 MED ORDER — CEPHALEXIN 500 MG PO CAPS
500.0000 mg | ORAL_CAPSULE | Freq: Three times a day (TID) | ORAL | Status: DC
  Administered 2018-01-23 – 2018-01-26 (×8): 500 mg via ORAL
  Filled 2018-01-23 (×9): qty 1

## 2018-01-23 MED ORDER — METOPROLOL TARTRATE 25 MG PO TABS
25.0000 mg | ORAL_TABLET | Freq: Four times a day (QID) | ORAL | Status: DC
  Administered 2018-01-23 – 2018-01-24 (×4): 25 mg via ORAL
  Filled 2018-01-23 (×5): qty 1

## 2018-01-23 MED ORDER — AMIODARONE HCL 200 MG PO TABS
400.0000 mg | ORAL_TABLET | Freq: Two times a day (BID) | ORAL | Status: DC
  Administered 2018-01-23 (×2): 400 mg via ORAL
  Filled 2018-01-23 (×2): qty 2

## 2018-01-23 MED ORDER — HEPARIN BOLUS VIA INFUSION
3000.0000 [IU] | Freq: Once | INTRAVENOUS | Status: AC
  Administered 2018-01-23: 3000 [IU] via INTRAVENOUS
  Filled 2018-01-23: qty 3000

## 2018-01-23 MED ORDER — SODIUM CHLORIDE 0.9 % IV SOLN
INTRAVENOUS | Status: AC
Start: 2018-01-23 — End: 2018-01-25
  Administered 2018-01-23 – 2018-01-24 (×3): via INTRAVENOUS

## 2018-01-23 NOTE — Progress Notes (Signed)
[facility] [place] NAME: [name]    MR#:  [id number]  DATE OF BIRTH:  22-Dec-1930  SUBJECTIVE:  CHIEF COMPLAINT:   Chief Complaint  Patient presents with  . Fall   -Agitated and restless.  Condom cath is bothering him -Heart rate still remains in the 130s  REVIEW OF SYSTEMS:  Review of Systems  Unable to perform ROS: Dementia    DRUG ALLERGIES:   Allergies  Allergen Reactions  . Budesonide-Formoterol Fumarate Swelling    Mouth and tongue swelling.  . Iohexol Shortness Of Breath and Itching    Pt was given 100cc of Omnipaque 300 followed by itching/ dyspnea.  . Simvastatin Other (See Comments)    REACTION: unknown  . Demerol [Meperidine] Other (See Comments)    REACTION: Hallucinations  . Ivp Dye [Iodinated Diagnostic Agents] Other (See Comments)    Iodine pt had a reaction when he had a CT DONE  . Meperidine Hcl Other (See Comments)    REACTION: Hallucinations  . Tape Other (See Comments)    Tears skin off, Please use "paper" tape  . Naproxen Swelling  . Pregabalin Rash  . Sulfonamide Derivatives Rash    VITALS:  Blood pressure 123/88, pulse (!) 119, temperature 98.4 F (36.9 C), temperature source Oral, resp. rate 18, weight 67.1 kg (148 lb), SpO2 95 %.  PHYSICAL EXAMINATION:  Physical Exam  GENERAL:  84 y.o.-year-old elderly patient lying in the bed, restless in the bed.  EYES: Pupils equal, round, reactive to light and accommodation. No scleral icterus. Extraocular muscles intact.  HEENT: Head atraumatic, normocephalic. Oropharynx and nasopharynx clear.  NECK:  Supple, no jugular venous distention. No thyroid enlargement, no tenderness.  LUNGS: Normal breath sounds bilaterally, no wheezing, rales,rhonchi or crepitation. No use of accessory muscles of respiration.  CARDIOVASCULAR: S1, S2 normal. No  rubs, or gallops. 2/6 systolic murmur present ABDOMEN: Soft, nontender, nondistended. Bowel sounds present.  No organomegaly or mass.  EXTREMITIES: sloughing off skin and open wounds posterior left shoulder -No pedal edema, cyanosis, or clubbing.  NEUROLOGIC: Cranial nerves II through XII are intact. Muscle strength 4/5 in all extremities. Sensation intact. Gait not checked. Global weakness PSYCHIATRIC: The patient is alert. Restless in bed SKIN: No obvious rash, lesion, or ulcer.    LABORATORY PANEL:   CBC Recent Labs  Lab 01/22/18 0142  WBC 11.1*  HGB 10.1*  HCT 30.4*  PLT 214   ------------------------------------------------------------------------------------------------------------------  Chemistries  Recent Labs  Lab 01/20/18 2036  01/21/18 1251 01/23/18 0508  NA 137   < >  --  140  K 3.7   < > 3.8 3.8  CL 101   < >  --  109  CO2 26   < >  --  27  GLUCOSE 171*   < >  --  149*  BUN 40*   < >  --  35*  CREATININE 0.95   < >  --  0.76  CALCIUM 8.4*   < >  --  8.2*  MG  --   --  2.2  --   AST 45*  --   --   --   ALT 36  --   --   --   ALKPHOS 76  --   --   --   BILITOT 1.7*  --   --   --    < > = values in this interval not displayed.   ------------------------------------------------------------------------------------------------------------------  Cardiac  Enzymes Recent Labs  Lab 01/21/18 1251  TROPONINI 0.19*   ------------------------------------------------------------------------------------------------------------------  RADIOLOGY:  Mr Brain Wo Contrast  Result Date: 01/21/2018 CLINICAL DATA:  Episode of unresponsiveness.  Found on the floor. EXAM: MRI HEAD WITHOUT CONTRAST TECHNIQUE: Multiplanar, multiecho pulse sequences of the brain and surrounding structures were obtained without intravenous contrast. COMPARISON:  CT 01/20/2018.  MRI 06/19/2017. FINDINGS: Brain: The examination suffers from motion degradation. There is no sign of acute or subacute infarction. The brainstem and cerebellum are normal. Cerebral hemispheres show atrophy with moderate chronic  small-vessel ischemic changes of the deep white matter. No cortical or large vessel territory abnormality. No mass lesion, hemorrhage, hydrocephalus or extra-axial collection. Vascular: Major vessels at the base of the brain show flow. Skull and upper cervical spine: Negative Sinuses/Orbits: Clear/normal Other: None IMPRESSION: No acute finding by MRI. Motion degraded exam showing atrophy and chronic small-vessel ischemic changes of the white matter. Electronically Signed   By: Nelson Chimes M.D.   On: 01/21/2018 16:10    EKG:   Orders placed or performed during the hospital encounter of 01/20/18  . EKG 12-Lead  . EKG 12-Lead  . EKG 12-Lead  . EKG 12-Lead  . EKG 12-Lead  . EKG 12-Lead  . EKG    ASSESSMENT AND PLAN:   84 year old male with past medical history significant for CAD, dementia, GI bleed history, hypertension presents to the hospital secondary to fall and noted to have A. Fib with RVR  1. Syncope/fall-according to family, probably mechanical fall and due to weakness patient couldn't get up. -Appreciate cardiology consult. -MRI of the brain negative for any acute findings. -Patient noted to have paroxysmal atrial fibrillation in the setting of rhabdomyolysis  2. Paroxysmal atrial fibrillation-appreciate cardiology consult -Due to difficulty in controlling the rate, currently on AMIODARONE drip- changed to oral today - Also IV heparin drip for anticoagulation.  -On oral metoprolol.  Not a good candidate for cardioversion due to other comorbid conditions. -Appreciate cardiology consult. -Not a good candidate for long-term anticoagulation  3. Fall and posterior skin wounds-has significant sloughing off wounds from pressure injury also chemical injury as patient was lying on detergent. - Wound care consult and topical treatment. -ID consult to see if he needs antibiotics  4.  Altered mental status-due to metabolic encephalopathy.  Chemical wounds, elevated heart rate likely  infection. -We will monitor carefully.  MRI was negative for any neurological findings  5. GERD-on Protonix  6. DVT prophylaxis-on heparin drip   Physical therapy consulted Updated Family at bedside-if no improvement in the next 24 hours, will consult palliative care.  Family agreeable   All the records are reviewed and case discussed with Care Management/Social Workerr. Management plans discussed with the patient, family and they are in agreement.  CODE STATUS: DNR  TOTAL TIME TAKING CARE OF THIS PATIENT: 38 minutes.   POSSIBLE D/C IN 2-3 DAYS, DEPENDING ON CLINICAL CONDITION.   Gladstone Lighter M.D on 01/23/2018 at 1:21 PM  Between 7am to 6pm - Pager - [phone]  After 6pm go to [email or web address] - password [email or web address] [facility]  Office  [phone]  CC: Primary care physician; [name], FNP

## 2018-01-23 NOTE — Progress Notes (Signed)
Progress Note  Patient Name: [name] Date of Encounter: 01/23/2018  Primary Cardiologist: Harrington Challenger  Subjective   Back in A. fib with RVR with heart rates into the 130s beats per minute this morning.  Sister at bedside.  Patient ate "5, maybe 6 bites of food" over the past 24 hours.  Remains agitated requiring Ativan.  BP stable.  Remains incoherent and disoriented.  Inpatient Medications    Scheduled Meds: . amiodarone  400 mg Oral BID  . aspirin EC  81 mg Oral Daily  . atorvastatin  80 mg Oral Daily  . collagenase   Topical BID  . feeding supplement (ENSURE ENLIVE)  237 mL Oral BID BM  . metoprolol tartrate  25 mg Oral Q6H  . multivitamin with minerals  1 tablet Oral Daily  . pantoprazole  40 mg Oral BID  . tamsulosin  0.4 mg Oral Daily   Continuous Infusions: . sodium chloride 75 mL/hr at 01/23/18 1026  . heparin 1,550 Units/hr (01/23/18 0127)   PRN Meds: acetaminophen **OR** acetaminophen, HYDROcodone-acetaminophen, LORazepam, metoprolol tartrate, morphine injection, ondansetron **OR** ondansetron (ZOFRAN) IV   Vital Signs    Vitals:   01/22/18 2008 01/23/18 0158 01/23/18 0505 01/23/18 0850  BP: 103/72 139/85 140/87 127/86  Pulse: (!) 136  (!) 123 75  Resp: 18  16 18   Temp: 98.1 F (36.7 C)  (!) 97.5 F (36.4 C) 98.4 F (36.9 C)  TempSrc: Oral  Oral Oral  SpO2: 100% 99% 97% 95%  Weight:        Intake/Output Summary (Last 24 hours) at 01/23/2018 1105 Last data filed at 01/23/2018 0400 Gross per 24 hour  Intake 1298.27 ml  Output 650 ml  Net 648.27 ml   Filed Weights   01/21/18 0224  Weight: 148 lb (67.1 kg)    Telemetry    Back in A. fib since 16: 43 on 4/8 with heart rates into the 120s-130s beats per minute- Personally Reviewed  ECG    n/a - Personally Reviewed  Physical Exam   GEN:  Frail and elderly appearing, no acute distress.   Neck: No JVD. Cardiac:  Tachycardic, irregularly irregular, no murmurs, rubs, or gallops.  Respiratory:   Diminished breath sounds bilaterally.  GI: Soft, nontender, non-distended.   MS: No edema; No deformity. Neuro:  Nonfocal.  Psych: Disoriented.  Labs    Chemistry Recent Labs  Lab 01/20/18 2036 01/21/18 0250 01/21/18 0858 01/21/18 1251 01/23/18 0508  NA 137  --  137  --  140  K 3.7  --  4.5 3.8 3.8  CL 101  --  106  --  109  CO2 26  --  26  --  27  GLUCOSE 171*  --  133*  --  149*  BUN 40*  --  40*  --  35*  CREATININE 0.95 0.92 0.94  --  0.76  CALCIUM 8.4*  --  8.1*  --  8.2*  PROT 6.5  --   --   --   --   ALBUMIN 3.0*  --   --   --   --   AST 45*  --   --   --   --   ALT 36  --   --   --   --   ALKPHOS 76  --   --   --   --   BILITOT 1.7*  --   --   --   --   GFRNONAA >60 >  60 >60  --  >60  GFRAA >60 >60 >60  --  >60  ANIONGAP 10  --  5  --  4*     Hematology Recent Labs  Lab 01/21/18 0250 01/21/18 0858 01/22/18 0142  WBC 14.7* 12.7* 11.1*  RBC 4.08* 3.74* 3.37*  HGB 12.1* 11.2* 10.1*  HCT 36.4* 33.7* 30.4*  MCV 89.2 90.2 90.2  MCH 29.8 30.0 30.1  MCHC 33.4 33.2 33.4  RDW 14.1 13.9 14.1  PLT 234 217 214    Cardiac Enzymes Recent Labs  Lab 01/20/18 2036 01/21/18 0250 01/21/18 0858 01/21/18 1251  TROPONINI 0.25* 0.21* 0.17* 0.19*   No results for input(s): TROPIPOC in the last 168 hours.   BNPNo results for input(s): BNP, PROBNP in the last 168 hours.   DDimer No results for input(s): DDIMER in the last 168 hours.   Radiology    Mr Brain Wo Contrast  Result Date: 01/21/2018 IMPRESSION: No acute finding by MRI. Motion degraded exam showing atrophy and chronic small-vessel ischemic changes of the white matter. Electronically Signed   By: Nelson Chimes M.D.   On: 01/21/2018 16:10    Cardiac Studies   2D Echocardiogram 9.2018  Study Conclusions  - Left ventricle: The cavity size was normal. There was moderate concentric hypertrophy. Systolic function was normal. The estimated ejection fraction was in the range of 55% to 60%.  Wall motion was normal; there were no regional wall motion abnormalities. Doppler parameters are consistent with abnormal left ventricular relaxation (grade 1 diastolic dysfunction). - Aortic valve: Transvalvular velocity was within the normal range. There was no stenosis. There was no regurgitation. - Mitral valve: Transvalvular velocity was within the normal range. There was no evidence for stenosis. There was no regurgitation. Valve area by pressure half-time: 2.37 cm^2. Valve area by continuity equation (using LVOT flow): 1.58 cm^2. - Left atrium: The atrium was mildly dilated. - Right ventricle: The cavity size was normal. Wall thickness was normal. Systolic function was normal. - Atrial septum: No defect or patent foramen ovale was identified. - Tricuspid valve: There was no regurgitation. - Pericardium, extracardiac: A trivial, free-flowing pericardial effusion was identified circumferential to the heart.    Patient Profile     84 y.o. male with history of CADs/p stent to the RCA and cutting balloon angioplasty to the diagonal,dementia,chronic chest pain,GI bleed felt to possibly be diverticular, orthostatic hypotension,hypertension,hyperlipidemia, GERDwho is being seen today for the evaluation of new onset Afib with RVR and possible fall vs syncope (found on floor - duration of time unknown but significant skin break down on coccyx and left upper back).  Assessment & Plan    1.  PAF with RVR: -Back in A. fib with RVR -Transition from IV amiodarone to p.o. amiodarone 400 mg twice daily for a total of 1 week followed by 200 mg twice daily for 1 week followed by 20 mg daily thereafter -Escalate his metoprolol to 25 mg every 6 hours -Remains on heparin infusion, though he is unlikely to be a good candidate for long-term, full dose oral anticoagulation.  However, we will continue heparin infusion at this time in case he may require TEE/DCCV for poorly  controlled ventricular rates.  Ideally, we would like to avoid TEE/DCCV given his comorbid conditions  2.  Rhabdomyolysis: -Found down on the floor by family down for unknown amount of time,  -Noted to have abrasions/ulcerations to his coccyx and left upper back -Per IM  3.  Elevated troponin/CAD: -No complaints of  chest pain -Troponin mildly elevated in the setting of his PAF with RVR and rhabdomyolysis with a peak of 0.25 -Given the patient's severe comorbid conditions including dementia and advanced age he is unlikely to be a candidate for ischemic evaluation -Continue medical therapy   For questions or updates, please contact [facility] Please consult [email or web address] for contact info under Cardiology/STEMI.    Signed, Christell Faith, PA-C [place] Pager: [phone] 01/23/2018, 11:05 AM

## 2018-01-23 NOTE — Progress Notes (Signed)
*  PRELIMINARY RESULTS* Echocardiogram 2D Echocardiogram has been performed.  [name] [name] 01/23/2018, 9:36 AM

## 2018-01-23 NOTE — Consult Note (Signed)
ANTICOAGULATION CONSULT NOTE - Initial Consult  Pharmacy Consult for Heparin Dosing and Monitoring  Indication: atrial fibrillation  Allergies  Allergen Reactions  . Budesonide-Formoterol Fumarate Swelling    Mouth and tongue swelling.  . Iohexol Shortness Of Breath and Itching    Pt was given 100cc of Omnipaque 300 followed by itching/ dyspnea.  . Simvastatin Other (See Comments)    REACTION: unknown  . Demerol [Meperidine] Other (See Comments)    REACTION: Hallucinations  . Ivp Dye [Iodinated Diagnostic Agents] Other (See Comments)    Iodine pt had a reaction when he had a CT DONE  . Meperidine Hcl Other (See Comments)    REACTION: Hallucinations  . Tape Other (See Comments)    Tears skin off, Please use "paper" tape  . Naproxen Swelling  . Pregabalin Rash  . Sulfonamide Derivatives Rash    Patient Measurements: Weight: 148 lb (67.1 kg)  Heparin dosing weight: 67 kg  Vital Signs: Temp: 98.4 F (36.9 C) (04/09 0850) Temp Source: Oral (04/09 0850) BP: 127/86 (04/09 0850) Pulse Rate: 75 (04/09 0850)  Labs: Recent Labs    01/20/18 2036 01/21/18 0250 01/21/18 0858 01/21/18 1251 01/22/18 0142  01/22/18 2114 01/23/18 0508 01/23/18 0953 01/23/18 1055  HGB 13.6 12.1* 11.2*  --  10.1*  --   --   --   --   --   HCT 41.3 36.4* 33.7*  --  30.4*  --   --   --   --   --   PLT 258 234 217  --  214  --   --   --   --   --   APTT  --   --   --  37*  --   --   --   --   --   --   LABPROT  --   --   --  16.2*  --   --   --   --   --   --   INR  --   --   --  1.31  --   --   --   --   --   --   HEPARINUNFRC  --   --   --   --  <0.10*   < > <0.10*  --  0.84* 0.85*  CREATININE 0.95 0.92 0.94  --   --   --   --  0.76  --   --   CKTOTAL 467*  --   --   --   --   --   --   --   --   --   CKMB  --   --   --  6.5*  --   --   --   --   --   --   TROPONINI 0.25* 0.21* 0.17* 0.19*  --   --   --   --   --   --    < > = values in this interval not displayed.    Estimated Creatinine  Clearance: 65.2 mL/min (by C-G formula based on SCr of 0.76 mg/dL).   Medical History: Past Medical History:  Diagnosis Date  . Arthritis    "hips, back" (06/17/2015)  . Asthma   . Chronic chest pain   . Chronic lower back pain   . Coronary artery disease    a. s/p BMS to RCA in 2002; b. s/p cutting balloon POBA ;   c. cath 6/12: oDx 80% (treated  with repeat cutting balloon POBA), mLAD 50% with 30-40% at Dx, CFX 30%, pRCA 25% with patent stents;  d.  Lex MV 4/14:  Low Risk - EF 61%, inf scar with peri-infarct ischemia  . Dyspnea    chronic  . GERD (gastroesophageal reflux disease)    h/o esophageal spasm  . Headache   . History of blood transfusion    "related to OR"  . History of hiatal hernia   . Hyperlipidemia   . Hypertension   . Melanoma of lower back ([place]) late 1990's  . Memory loss   . Myocardial infarction ([place]) 2001   x 1, confirned 1 possible     Assessment: Pharmacy consulted for heparin dosing and monitoring in 84 yo male for A. Fib.   Goal of Therapy:  Heparin level 0.3-0.7 units/ml Monitor platelets by anticoagulation protocol: Yes   Plan:  Baseline labs ordered Give 4000 units bolus x 1 Start heparin infusion at 1000 units/hr Check anti-Xa level in 6 hours and daily while on heparin Continue to monitor H&H and platelets   04/08 0130 heparin level <0.1. 2000 unit bolus and increase rate to 1250 units/hr.   04/08 1100 Heparin level 0.16. Nurse confirmed that there has not been any interruption in heparin therapy during her shift. Will order 2000 unit bolus and increase infusion to 1400u/hr. Recheck HL in 8 hours.    04/08 @ 2100 HL < 0.10 subtherapeutic. Will rebolus w/ heparin 3000 units IV x 1 and increase rate to 1550 units/hr and will recheck next anti-Xa @ 0900.  04/09 @ 0953 HL 0.84, level confirmed at 1055 (HL= 0.85). Will decrease heparin drip to 1450u/hr and recheck HL in 8 hours. CBC with AM labs per protocol.   Pernell Dupre, PharmD,  BCPS Clinical Pharmacist 01/23/2018 11:31 AM

## 2018-01-23 NOTE — Consult Note (Signed)
[facility] Infectious Disease     Reason for Consult: Wound    Referring Physician: Claria Dice Date of Admission:  01/20/2018   Principal Problem:   Unresponsive episode Active Problems:   Hyperlipidemia   CAD (coronary artery disease)   GERD   Essential hypertension   Elevated troponin   Leukocytosis   Atrial fibrillation (HCC)   Malnutrition of moderate degree   Pressure injury of skin   HPI: [name] is a 84 y.o. male admitted 4/6 with AMS found down at home for unknown time period. He on admit had wbc 16, but no fevers. He was found to have a large pressure wound on shoulder from being down and in contact with dish detergnet He has been seen by WOC.   Past Medical History:  Diagnosis Date  . Arthritis    "hips, back" (06/17/2015)  . Asthma   . Chronic chest pain   . Chronic lower back pain   . Coronary artery disease    a. s/p BMS to RCA in 2002; b. s/p cutting balloon POBA ;   c. cath 6/12: oDx 80% (treated with repeat cutting balloon POBA), mLAD 50% with 30-40% at Dx, CFX 30%, pRCA 25% with patent stents;  d.  Lex MV 4/14:  Low Risk - EF 61%, inf scar with peri-infarct ischemia  . Dyspnea    chronic  . GERD (gastroesophageal reflux disease)    h/o esophageal spasm  . Headache   . History of blood transfusion    "related to OR"  . History of hiatal hernia   . Hyperlipidemia   . Hypertension   . Melanoma of lower back ([place]) late 1990's  . Memory loss   . Myocardial infarction ([place]) 2001   x 1, confirned 1 possible   Past Surgical History:  Procedure Laterality Date  . ANTERIOR CERVICAL DECOMP/DISCECTOMY FUSION    . BACK SURGERY  multiple  . CARDIAC CATHETERIZATION     "I've had 17 caths" (06/17/2015)  . CATARACT EXTRACTION W/ INTRAOCULAR LENS  IMPLANT, BILATERAL Bilateral   . CERVICAL DISC SURGERY  multiple  . COLONOSCOPY WITH PROPOFOL N/A 04/17/2014   Procedure: COLONOSCOPY WITH PROPOFOL;  Surgeon: Winfield Cunas., MD;  Location: [facility] ENDOSCOPY;   Service: Endoscopy;  Laterality: N/A;  . CORONARY ANGIOPLASTY WITH STENT PLACEMENT  x 2 stents    previous percutaneous intervention on the  RCA and the diagonal branch  . ESOPHAGOGASTRODUODENOSCOPY  03/23/2012   Procedure: ESOPHAGOGASTRODUODENOSCOPY (EGD);  Surgeon: Winfield Cunas., MD;  Location: [facility];  Service: Endoscopy;  Laterality: N/A;  . ESOPHAGOGASTRODUODENOSCOPY N/A 03/04/2014   Procedure: ESOPHAGOGASTRODUODENOSCOPY (EGD);  Surgeon: Arta Silence, MD;  Location: [facility] ENDOSCOPY;  Service: Endoscopy;  Laterality: N/A;  . LAMINECTOMY    . LEFT HEART CATHETERIZATION WITH CORONARY ANGIOGRAM N/A 01/13/2014   Procedure: LEFT HEART CATHETERIZATION WITH CORONARY ANGIOGRAM;  Surgeon: Blane Ohara, MD;  Location: [facility] CATH LAB;  Service: Cardiovascular;  Laterality: N/A;  . MELANOMA EXCISION  late 1990's   "lower back"  . POSTERIOR LAMINECTOMY / DECOMPRESSION CERVICAL SPINE    . SAVORY DILATION  03/23/2012   Procedure: SAVORY DILATION;  Surgeon: Winfield Cunas., MD;  Location: [facility] ENDOSCOPY;  Service: Endoscopy;  Laterality: N/A;  . TONSILLECTOMY  1930's   Social History   Tobacco Use  . Smoking status: Never Smoker  . Smokeless tobacco: Never Used  Substance Use Topics  . Alcohol use: No  . Drug use: No  Family History  Problem Relation Age of Onset  . Diabetes Father   . Heart disease Father   . Asthma Father   . Heart disease Mother        CABG hx age 27  . Colon cancer Son        hx   . Colitis Son        hx  . Crohn's disease Son   . Prostate cancer Paternal Grandfather     Allergies:  Allergies  Allergen Reactions  . Budesonide-Formoterol Fumarate Swelling    Mouth and tongue swelling.  . Iohexol Shortness Of Breath and Itching    Pt was given 100cc of Omnipaque 300 followed by itching/ dyspnea.  . Simvastatin Other (See Comments)    REACTION: unknown  . Demerol [Meperidine] Other (See Comments)    REACTION: Hallucinations  . Ivp Dye [Iodinated  Diagnostic Agents] Other (See Comments)    Iodine pt had a reaction when he had a CT DONE  . Meperidine Hcl Other (See Comments)    REACTION: Hallucinations  . Tape Other (See Comments)    Tears skin off, Please use "paper" tape  . Naproxen Swelling  . Pregabalin Rash  . Sulfonamide Derivatives Rash    Current antibiotics: Antibiotics Given (last 72 hours)    None      MEDICATIONS: . amiodarone  400 mg Oral BID  . aspirin EC  81 mg Oral Daily  . atorvastatin  80 mg Oral Daily  . collagenase   Topical BID  . feeding supplement (ENSURE ENLIVE)  237 mL Oral BID BM  . metoprolol tartrate  25 mg Oral Q6H  . multivitamin with minerals  1 tablet Oral Daily  . pantoprazole  40 mg Oral BID  . tamsulosin  0.4 mg Oral Daily    Review of Systems - unable to obtain  OBJECTIVE: Temp:  [97.5 F (36.4 C)-98.4 F (36.9 C)] 98.4 F (36.9 C) (04/09 0850) Pulse Rate:  [75-136] 119 (04/09 1255) Resp:  [16-18] 18 (04/09 0850) BP: (103-140)/(72-88) 123/88 (04/09 1255) SpO2:  [95 %-100 %] 95 % (04/09 0850)  Physical Exam  Constitutional: lethargic but arousable HENT: anicteirc Mouth/Throat: Oropharynx is clear and moist.  Cardiovascular: Normal rate, regular rhythm and normal heart sounds. Pulmonary/Chest: Effort normal and breath sounds normal. Abdominal: Soft. Bowel sounds are normal. He exhibits no distension. There is no tenderness.  Lymphadenopathy: He has no cervical adenopathy.  Neurological lethargic  Skin: L shoulder and back with extensive wound with some necrotic tissue adherent. Mod ttp  WOC note Full and partial thickness tissue injury to left upper shoulder and back with soft, movable and autolytically dissolving eschar measuring 4cm x 7cm.  Total area of partial thickness tissue loss measuring 14cm x 40cm x 0.2cm.  Moist, pink wound bed, moderate amount of serous to light blue exudate (possible detergent by product).    LABS: Results for orders placed or performed during  the hospital encounter of 01/20/18 (from the past 48 hour(s))  CBC     Status: Abnormal   Collection Time: 01/22/18  1:42 AM  Result Value Ref Range   WBC 11.1 (H) 3.8 - 10.6 K/uL   RBC 3.37 (L) 4.40 - 5.90 MIL/uL   Hemoglobin 10.1 (L) 13.0 - 18.0 g/dL   HCT 30.4 (L) 40.0 - 52.0 %   MCV 90.2 80.0 - 100.0 fL   MCH 30.1 26.0 - 34.0 pg   MCHC 33.4 32.0 - 36.0 g/dL   RDW 14.1  11.5 - 14.5 %   Platelets 214 150 - 440 K/uL    Comment: Performed at [facility], [place], [place] [address]  Heparin level (unfractionated)     Status: Abnormal   Collection Time: 01/22/18  1:42 AM  Result Value Ref Range   Heparin Unfractionated <0.10 (L) 0.30 - 0.70 IU/mL    Comment:        IF HEPARIN RESULTS ARE BELOW EXPECTED VALUES, AND PATIENT DOSAGE HAS BEEN CONFIRMED, SUGGEST FOLLOW UP TESTING OF ANTITHROMBIN III LEVELS. Performed at [facility], [place], [place] [address]   Heparin level (unfractionated)     Status: Abnormal   Collection Time: 01/22/18 11:10 AM  Result Value Ref Range   Heparin Unfractionated 0.16 (L) 0.30 - 0.70 IU/mL    Comment:        IF HEPARIN RESULTS ARE BELOW EXPECTED VALUES, AND PATIENT DOSAGE HAS BEEN CONFIRMED, SUGGEST FOLLOW UP TESTING OF ANTITHROMBIN III LEVELS. Performed at [facility], [place], [place] [address]   Heparin level (unfractionated)     Status: Abnormal   Collection Time: 01/22/18  9:14 PM  Result Value Ref Range   Heparin Unfractionated <0.10 (L) 0.30 - 0.70 IU/mL    Comment:        IF HEPARIN RESULTS ARE BELOW EXPECTED VALUES, AND PATIENT DOSAGE HAS BEEN CONFIRMED, SUGGEST FOLLOW UP TESTING OF ANTITHROMBIN III LEVELS. Performed at [facility], [address] [place], [place] [address]   Basic metabolic panel     Status: Abnormal   Collection Time: 01/23/18  5:08 AM  Result Value Ref Range   Sodium 140 135 - 145 mmol/L   Potassium 3.8 3.5 - 5.1  mmol/L   Chloride 109 101 - 111 mmol/L   CO2 27 22 - 32 mmol/L   Glucose, Bld 149 (H) 65 - 99 mg/dL   BUN 35 (H) 6 - 20 mg/dL   Creatinine, Ser 0.76 0.61 - 1.24 mg/dL   Calcium 8.2 (L) 8.9 - 10.3 mg/dL   GFR calc non Af Amer >60 >60 mL/min   GFR calc Af Amer >60 >60 mL/min    Comment: (NOTE) The eGFR has been calculated using the CKD EPI equation. This calculation has not been validated in all clinical situations. eGFR's persistently <60 mL/min signify possible Chronic Kidney Disease.    Anion gap 4 (L) 5 - 15    Comment: Performed at [facility], [place], [place] [address]  Heparin level (unfractionated)     Status: Abnormal   Collection Time: 01/23/18  9:53 AM  Result Value Ref Range   Heparin Unfractionated 0.84 (H) 0.30 - 0.70 IU/mL    Comment:        IF HEPARIN RESULTS ARE BELOW EXPECTED VALUES, AND PATIENT DOSAGE HAS BEEN CONFIRMED, SUGGEST FOLLOW UP TESTING OF ANTITHROMBIN III LEVELS. Performed at [facility], [place], [place] [address]   Heparin level (unfractionated)     Status: Abnormal   Collection Time: 01/23/18 10:55 AM  Result Value Ref Range   Heparin Unfractionated 0.85 (H) 0.30 - 0.70 IU/mL    Comment:        IF HEPARIN RESULTS ARE BELOW EXPECTED VALUES, AND PATIENT DOSAGE HAS BEEN CONFIRMED, SUGGEST FOLLOW UP TESTING OF ANTITHROMBIN III LEVELS. Performed at [facility], [address] [place], [place] [address]    No components found for: ESR, C REACTIVE PROTEIN MICRO: Recent  Results (from the past 720 hour(s))  Urine Culture     Status: None   Collection Time: 01/21/18  1:43 AM  Result Value Ref Range Status   Specimen Description   Final    URINE, RANDOM Performed at [facility], [address], [place], [place] [address]    Special Requests   Final    NONE Performed at [facility], [address] [place], [place] [address]    Culture   Final    NO  GROWTH Performed at [facility], [place] [address], [place], [place] [address]    Report Status 01/22/2018 FINAL  Final    IMAGING: Ct Head Wo Contrast  Result Date: 01/20/2018 CLINICAL DATA:  Found on the floor.  Dementia. EXAM: CT HEAD WITHOUT CONTRAST CT CERVICAL SPINE WITHOUT CONTRAST TECHNIQUE: Multidetector CT imaging of the head and cervical spine was performed following the standard protocol without intravenous contrast. Multiplanar CT image reconstructions of the cervical spine were also generated. COMPARISON:  07/31/2017 FINDINGS: CT HEAD FINDINGS Brain: Generalized atrophy. Chronic small-vessel ischemic changes of the hemispheric white matter. No sign of acute infarction, mass lesion, hemorrhage, hydrocephalus or extra-axial collection. Vascular: There is atherosclerotic calcification of the major vessels at the base of the brain. Skull: Normal Sinuses/Orbits: Clear/normal Other: None CT CERVICAL SPINE FINDINGS Alignment: Normal Skull base and vertebrae: Chronic arthropathy at the C1-2 articulation with moderate encroachment upon the spinal canal. Some potential for up or cervical cord impingement. Soft tissues and spinal canal: Negative Disc levels: Chronic degenerative spondylosis at C2-3 but without compressive stenosis. Previous ACDF C3 through C7 appears solid with sufficient patency of the canal and foramina. C7-T1 shows mild disc degeneration. Upper chest: Negative Other: None IMPRESSION: Head CT: No acute or traumatic finding. Atrophy and chronic small-vessel ischemic changes. Cervical spine CT: No acute or traumatic finding. Previous ACDF C3 through C7. Arthropathy at the C1-2 articulation which results in encroachment upon the spinal canal that could impinge upon the cervical spinal cord. This is a chronic finding. 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 Electronically Signed   By: Nelson Chimes M.D.   On: 01/20/2018 21:42   Ct Cervical Spine Wo Contrast  Result Date:  01/20/2018 CLINICAL DATA:  Found on the floor.  Dementia. EXAM: CT HEAD WITHOUT CONTRAST CT CERVICAL SPINE WITHOUT CONTRAST TECHNIQUE: Multidetector CT imaging of the head and cervical spine was performed following the standard protocol without intravenous contrast. Multiplanar CT image reconstructions of the cervical spine were also generated. COMPARISON:  07/31/2017 FINDINGS: CT HEAD FINDINGS Brain: Generalized atrophy. Chronic small-vessel ischemic changes of the hemispheric white matter. No sign of acute infarction, mass lesion, hemorrhage, hydrocephalus or extra-axial collection. Vascular: There is atherosclerotic calcification of the major vessels at the base of the brain. Skull: Normal Sinuses/Orbits: Clear/normal Other: None CT CERVICAL SPINE FINDINGS Alignment: Normal Skull base and vertebrae: Chronic arthropathy at the C1-2 articulation with moderate encroachment upon the spinal canal. Some potential for up or cervical cord impingement. Soft tissues and spinal canal: Negative Disc levels: Chronic degenerative spondylosis at C2-3 but without compressive stenosis. Previous ACDF C3 through C7 appears solid with sufficient patency of the canal and foramina. C7-T1 shows mild disc degeneration. Upper chest: Negative Other: None IMPRESSION: Head CT: No acute or traumatic finding. Atrophy and chronic small-vessel ischemic changes. Cervical spine CT: No acute or traumatic finding. Previous ACDF C3 through C7. Arthropathy at the C1-2 articulation which results in encroachment upon the spinal canal that could impinge  upon the cervical spinal cord. This is a chronic finding. 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 Electronically Signed   By: Nelson Chimes M.D.   On: 01/20/2018 21:42   Mr Brain Wo Contrast  Result Date: 01/21/2018 CLINICAL DATA:  Episode of unresponsiveness.  Found on the floor. EXAM: MRI HEAD WITHOUT CONTRAST TECHNIQUE: Multiplanar, multiecho pulse sequences of the brain and surrounding structures  were obtained without intravenous contrast. COMPARISON:  CT 01/20/2018.  MRI 06/19/2017. FINDINGS: Brain: The examination suffers from motion degradation. There is no sign of acute or subacute infarction. The brainstem and cerebellum are normal. Cerebral hemispheres show atrophy with moderate chronic small-vessel ischemic changes of the deep white matter. No cortical or large vessel territory abnormality. No mass lesion, hemorrhage, hydrocephalus or extra-axial collection. Vascular: Major vessels at the base of the brain show flow. Skull and upper cervical spine: Negative Sinuses/Orbits: Clear/normal Other: None IMPRESSION: No acute finding by MRI. Motion degraded exam showing atrophy and chronic small-vessel ischemic changes of the white matter. Electronically Signed   By: Nelson Chimes M.D.   On: 01/21/2018 16:10   Dg Chest Port 1 View  Result Date: 01/21/2018 CLINICAL DATA:  Patient was found on the floor, down for an unknown amount of time. History of dementia. EXAM: PORTABLE CHEST 1 VIEW COMPARISON:  07/31/2017 FINDINGS: Shallow inspiration. Heart size and pulmonary vascularity are normal. Lungs are clear and expanded. No pneumothorax. No pleural effusions. Calcification of the aorta. Postoperative changes in the cervical spine. IMPRESSION: Shallow inspiration.  No evidence of active pulmonary disease. Electronically Signed   By: Lucienne Capers M.D.   On: 01/21/2018 01:42    Assessment:   [name] is a 84 y.o. male with pressure ulcer on L shoulder and back after a fall where he lay in detergent for an unknown period of time (up to 3 days possible) Had mild elevated wbc. Wound is inflamed and has necrotic tissue. He would benefit from 7 days abx therapy  Recommendations Start keflex 500 tid cx done  Thank you very much for allowing me to participate in the care of this patient. Please call with questions.   Cheral Marker. Ola Spurr, MD

## 2018-01-23 NOTE — Consult Note (Signed)
ANTICOAGULATION CONSULT NOTE - Initial Consult  Pharmacy Consult for Heparin Dosing and Monitoring  Indication: atrial fibrillation  Allergies  Allergen Reactions  . Budesonide-Formoterol Fumarate Swelling    Mouth and tongue swelling.  . Iohexol Shortness Of Breath and Itching    Pt was given 100cc of Omnipaque 300 followed by itching/ dyspnea.  . Simvastatin Other (See Comments)    REACTION: unknown  . Demerol [Meperidine] Other (See Comments)    REACTION: Hallucinations  . Ivp Dye [Iodinated Diagnostic Agents] Other (See Comments)    Iodine pt had a reaction when he had a CT DONE  . Meperidine Hcl Other (See Comments)    REACTION: Hallucinations  . Tape Other (See Comments)    Tears skin off, Please use "paper" tape  . Naproxen Swelling  . Pregabalin Rash  . Sulfonamide Derivatives Rash    Patient Measurements: Height: 5\' 11"  (180.3 cm) Weight: 225 lb (102.1 kg) IBW/kg (Calculated) : 75.3  Heparin dosing weight: 67 kg  Vital Signs: Temp: 98.1 F (36.7 C) (04/08 2008) Temp Source: Oral (04/08 2008) BP: 103/72 (04/08 2008) Pulse Rate: 136 (04/08 2008)  Labs: Recent Labs    01/20/18 2036 01/21/18 0250 01/21/18 0858 01/21/18 1251 01/22/18 0142 01/22/18 1110 01/22/18 2114  HGB 13.6 12.1* 11.2*  --  10.1*  --   --   HCT 41.3 36.4* 33.7*  --  30.4*  --   --   PLT 258 234 217  --  214  --   --   APTT  --   --   --  37*  --   --   --   LABPROT  --   --   --  16.2*  --   --   --   INR  --   --   --  1.31  --   --   --   HEPARINUNFRC  --   --   --   --  <0.10* 0.16* <0.10*  CREATININE 0.95 0.92 0.94  --   --   --   --   CKTOTAL 467*  --   --   --   --   --   --   CKMB  --   --   --  6.5*  --   --   --   TROPONINI 0.25* 0.21* 0.17* 0.19*  --   --   --     Estimated Creatinine Clearance: 71.2 mL/min (by C-G formula based on SCr of 0.94 mg/dL).   Medical History: Past Medical History:  Diagnosis Date  . Arthritis    "hips, back" (06/17/2015)  . Asthma   .  Chronic chest pain   . Chronic lower back pain   . Coronary artery disease    a. s/p BMS to RCA in 2002; b. s/p cutting balloon POBA ;   c. cath 6/12: oDx 80% (treated with repeat cutting balloon POBA), mLAD 50% with 30-40% at Dx, CFX 30%, pRCA 25% with patent stents;  d.  Lex MV 4/14:  Low Risk - EF 61%, inf scar with peri-infarct ischemia  . Dyspnea    chronic  . GERD (gastroesophageal reflux disease)    h/o esophageal spasm  . Headache   . History of blood transfusion    "related to OR"  . History of hiatal hernia   . Hyperlipidemia   . Hypertension   . Melanoma of lower back ([place]) late 1990's  . Memory loss   . Myocardial infarction (  [place]) 2001   x 1, confirned 1 possible     Assessment: Pharmacy consulted for heparin dosing and monitoring in 84 yo male for A. Fib.   Goal of Therapy:  Heparin level 0.3-0.7 units/ml Monitor platelets by anticoagulation protocol: Yes   Plan:  Baseline labs ordered Give 4000 units bolus x 1 Start heparin infusion at 1000 units/hr Check anti-Xa level in 6 hours and daily while on heparin Continue to monitor H&H and platelets   04/08 0130 heparin level <0.1. 2000 unit bolus and increase rate to 1250 units/hr.   04/08 1100 Heparin level 0.16. Nurse confirmed that there has not been any interruption in heparin therapy during her shift. Will order 2000 unit bolus and increase infusion to 1400u/hr. Recheck HL in 8 hours.    04/08 @ 2100 HL < 0.10 subtherapeutic. Will rebolus w/ heparin 3000 units IV x 1 and increase rate to 1550 units/hr and will recheck next anti-Xa @ 0900.  Tobie Lords, PharmD, BCPS Clinical Pharmacist 01/23/2018 1:19 AM

## 2018-01-23 NOTE — Consult Note (Signed)
ANTICOAGULATION CONSULT NOTE - Initial Consult  Pharmacy Consult for Heparin Dosing and Monitoring  Indication: atrial fibrillation  Allergies  Allergen Reactions  . Budesonide-Formoterol Fumarate Swelling    Mouth and tongue swelling.  . Iohexol Shortness Of Breath and Itching    Pt was given 100cc of Omnipaque 300 followed by itching/ dyspnea.  . Simvastatin Other (See Comments)    REACTION: unknown  . Demerol [Meperidine] Other (See Comments)    REACTION: Hallucinations  . Ivp Dye [Iodinated Diagnostic Agents] Other (See Comments)    Iodine pt had a reaction when he had a CT DONE  . Meperidine Hcl Other (See Comments)    REACTION: Hallucinations  . Tape Other (See Comments)    Tears skin off, Please use "paper" tape  . Naproxen Swelling  . Pregabalin Rash  . Sulfonamide Derivatives Rash    Patient Measurements: Weight: 148 lb (67.1 kg)  Heparin dosing weight: 67 kg  Vital Signs: Temp: 98.9 F (37.2 C) (04/09 1544) Temp Source: Oral (04/09 1544) BP: 115/82 (04/09 1544) Pulse Rate: 106 (04/09 1544)  Labs: Recent Labs    01/21/18 0250 01/21/18 0858 01/21/18 1251 01/22/18 0142  01/23/18 0508 01/23/18 0953 01/23/18 1055 01/23/18 2042  HGB 12.1* 11.2*  --  10.1*  --   --   --   --   --   HCT 36.4* 33.7*  --  30.4*  --   --   --   --   --   PLT 234 217  --  214  --   --   --   --   --   APTT  --   --  37*  --   --   --   --   --   --   LABPROT  --   --  16.2*  --   --   --   --   --   --   INR  --   --  1.31  --   --   --   --   --   --   HEPARINUNFRC  --   --   --  <0.10*   < >  --  0.84* 0.85* 0.59  CREATININE 0.92 0.94  --   --   --  0.76  --   --   --   CKMB  --   --  6.5*  --   --   --   --   --   --   TROPONINI 0.21* 0.17* 0.19*  --   --   --   --   --   --    < > = values in this interval not displayed.    Estimated Creatinine Clearance: 65.2 mL/min (by C-G formula based on SCr of 0.76 mg/dL).   Medical History: Past Medical History:  Diagnosis  Date  . Arthritis    "hips, back" (06/17/2015)  . Asthma   . Chronic chest pain   . Chronic lower back pain   . Coronary artery disease    a. s/p BMS to RCA in 2002; b. s/p cutting balloon POBA ;   c. cath 6/12: oDx 80% (treated with repeat cutting balloon POBA), mLAD 50% with 30-40% at Dx, CFX 30%, pRCA 25% with patent stents;  d.  Lex MV 4/14:  Low Risk - EF 61%, inf scar with peri-infarct ischemia  . Dyspnea    chronic  . GERD (gastroesophageal reflux disease)    h/o  esophageal spasm  . Headache   . History of blood transfusion    "related to OR"  . History of hiatal hernia   . Hyperlipidemia   . Hypertension   . Melanoma of lower back ([place]) late 1990's  . Memory loss   . Myocardial infarction ([place]) 2001   x 1, confirned 1 possible     Assessment: Pharmacy consulted for heparin dosing and monitoring in 84 yo male for A. Fib.   Goal of Therapy:  Heparin level 0.3-0.7 units/ml Monitor platelets by anticoagulation protocol: Yes   Plan:  HL = 0.59 is therapeutic. Continue heparin infusion at current rate of 1450 units/hr and order confirmatory HL in 6 hours.  CBC ordered with AM labs tomorrow  Lenis Noon, PharmD, BCPS Clinical Pharmacist 01/23/2018 9:31 PM

## 2018-01-23 NOTE — Progress Notes (Signed)
Removed condom cath and replaced with brief at this time per [facility], pt c/o of irritation due to condom cath bothering him. Family at bedside concerned and supportive in patient care.

## 2018-01-23 NOTE — Progress Notes (Signed)
PT Cancellation Note  Patient Details Name: [name] MRN: [id number] DOB: 1931/10/10   Cancelled Treatment:    Reason Eval/Treat Not Completed: Medical issues which prohibited therapy(Chart reviewed for re-attempt at evaluation. Per telemetry monitor, patient HR fluctuating 120-130s at rest; remains on amioderone drip.  Will continue hold and re-attempt at later time/date as HR controlled and patient medically appropriate for exertional activity.)  Jenavi Beedle H. Owens Shark, PT, DPT, NCS 01/23/18, 9:57 AM [phone]

## 2018-01-23 NOTE — Plan of Care (Signed)
Afib with RVR continues.

## 2018-01-24 DIAGNOSIS — L909 Atrophic disorder of skin, unspecified: Secondary | ICD-10-CM

## 2018-01-24 DIAGNOSIS — I10 Essential (primary) hypertension: Secondary | ICD-10-CM

## 2018-01-24 LAB — CBC
HEMATOCRIT: 33.1 % — AB (ref 40.0–52.0)
Hemoglobin: 10.9 g/dL — ABNORMAL LOW (ref 13.0–18.0)
MCH: 30 pg (ref 26.0–34.0)
MCHC: 32.9 g/dL (ref 32.0–36.0)
MCV: 91.1 fL (ref 80.0–100.0)
Platelets: 298 10*3/uL (ref 150–440)
RBC: 3.63 MIL/uL — ABNORMAL LOW (ref 4.40–5.90)
RDW: 14.1 % (ref 11.5–14.5)
WBC: 10.3 10*3/uL (ref 3.8–10.6)

## 2018-01-24 LAB — BASIC METABOLIC PANEL
Anion gap: 4 — ABNORMAL LOW (ref 5–15)
BUN: 33 mg/dL — ABNORMAL HIGH (ref 6–20)
CALCIUM: 8.6 mg/dL — AB (ref 8.9–10.3)
CO2: 29 mmol/L (ref 22–32)
CREATININE: 0.78 mg/dL (ref 0.61–1.24)
Chloride: 109 mmol/L (ref 101–111)
GFR calc non Af Amer: >60 mL/min (ref >60)
Glucose, Bld: 142 mg/dL — ABNORMAL HIGH (ref 65–99)
Potassium: 4 mmol/L (ref 3.5–5.1)
Sodium: 142 mmol/L (ref 135–145)

## 2018-01-24 LAB — HEPARIN LEVEL (UNFRACTIONATED): Heparin Unfractionated: 0.92 IU/mL — ABNORMAL HIGH (ref 0.30–0.70)

## 2018-01-24 MED ORDER — ENSURE ENLIVE PO LIQD: 237.0000 mL | Freq: Two times a day (BID) | ORAL | 12 refills | Status: DC

## 2018-01-24 MED ORDER — WITCH HAZEL-GLYCERIN EX PADS
MEDICATED_PAD | CUTANEOUS | Status: DC | PRN
  Administered 2018-01-24: 1 via TOPICAL
  Filled 2018-01-24: qty 100

## 2018-01-24 MED ORDER — CEPHALEXIN 500 MG PO CAPS: 500.0000 mg | ORAL_CAPSULE | Freq: Three times a day (TID) | ORAL | 0 refills | Status: DC

## 2018-01-24 MED ORDER — AMIODARONE HCL 200 MG PO TABS
200.0000 mg | ORAL_TABLET | Freq: Two times a day (BID) | ORAL | Status: DC
  Administered 2018-01-24 – 2018-01-26 (×3): 200 mg via ORAL
  Filled 2018-01-24 (×4): qty 1

## 2018-01-24 MED ORDER — HYDROCODONE-ACETAMINOPHEN 7.5-325 MG PO TABS: 1.0000 | ORAL_TABLET | ORAL | 0 refills | Status: DC | PRN

## 2018-01-24 MED ORDER — AMIODARONE HCL 200 MG PO TABS: 200.0000 mg | ORAL_TABLET | Freq: Two times a day (BID) | ORAL | 0 refills | Status: DC

## 2018-01-24 MED ORDER — METOPROLOL TARTRATE 25 MG PO TABS
25.0000 mg | ORAL_TABLET | Freq: Two times a day (BID) | ORAL | Status: DC
  Administered 2018-01-24 – 2018-01-26 (×2): 25 mg via ORAL
  Filled 2018-01-24 (×3): qty 1

## 2018-01-24 MED ORDER — COLLAGENASE 250 UNIT/GM EX OINT: TOPICAL_OINTMENT | Freq: Two times a day (BID) | CUTANEOUS | 0 refills | Status: DC

## 2018-01-24 MED ORDER — AMLODIPINE BESYLATE 5 MG PO TABS
5.0000 mg | ORAL_TABLET | Freq: Two times a day (BID) | ORAL | Status: DC
  Administered 2018-01-24 – 2018-01-26 (×3): 5 mg via ORAL
  Filled 2018-01-24 (×4): qty 1

## 2018-01-24 NOTE — Consult Note (Signed)
ANTICOAGULATION CONSULT NOTE - Initial Consult  Pharmacy Consult for Heparin Dosing and Monitoring  Indication: atrial fibrillation  Allergies  Allergen Reactions  . Budesonide-Formoterol Fumarate Swelling    Mouth and tongue swelling.  . Iohexol Shortness Of Breath and Itching    Pt was given 100cc of Omnipaque 300 followed by itching/ dyspnea.  . Simvastatin Other (See Comments)    REACTION: unknown  . Demerol [Meperidine] Other (See Comments)    REACTION: Hallucinations  . Ivp Dye [Iodinated Diagnostic Agents] Other (See Comments)    Iodine pt had a reaction when he had a CT DONE  . Meperidine Hcl Other (See Comments)    REACTION: Hallucinations  . Tape Other (See Comments)    Tears skin off, Please use "paper" tape  . Naproxen Swelling  . Pregabalin Rash  . Sulfonamide Derivatives Rash    Patient Measurements: Weight: 148 lb (67.1 kg)  Heparin dosing weight: 67 kg  Vital Signs: Temp: 97.8 F (36.6 C) (04/10 0510) Temp Source: Oral (04/10 0510) BP: 155/74 (04/10 0510) Pulse Rate: 61 (04/10 0510)  Labs: Recent Labs    01/21/18 0858 01/21/18 1251 01/22/18 0142  01/23/18 0508  01/23/18 1055 01/23/18 2042 01/24/18 0510  HGB 11.2*  --  10.1*  --   --   --   --   --  10.9*  HCT 33.7*  --  30.4*  --   --   --   --   --  33.1*  PLT 217  --  214  --   --   --   --   --  298  APTT  --  37*  --   --   --   --   --   --   --   LABPROT  --  16.2*  --   --   --   --   --   --   --   INR  --  1.31  --   --   --   --   --   --   --   HEPARINUNFRC  --   --  <0.10*   < >  --    < > 0.85* 0.59 0.92*  CREATININE 0.94  --   --   --  0.76  --   --   --  0.78  CKMB  --  6.5*  --   --   --   --   --   --   --   TROPONINI 0.17* 0.19*  --   --   --   --   --   --   --    < > = values in this interval not displayed.    Estimated Creatinine Clearance: 65.2 mL/min (by C-G formula based on SCr of 0.78 mg/dL).   Medical History: Past Medical History:  Diagnosis Date  . Arthritis     "hips, back" (06/17/2015)  . Asthma   . Chronic chest pain   . Chronic lower back pain   . Coronary artery disease    a. s/p BMS to RCA in 2002; b. s/p cutting balloon POBA ;   c. cath 6/12: oDx 80% (treated with repeat cutting balloon POBA), mLAD 50% with 30-40% at Dx, CFX 30%, pRCA 25% with patent stents;  d.  Lex MV 4/14:  Low Risk - EF 61%, inf scar with peri-infarct ischemia  . Dyspnea    chronic  . GERD (gastroesophageal reflux disease)  h/o esophageal spasm  . Headache   . History of blood transfusion    "related to OR"  . History of hiatal hernia   . Hyperlipidemia   . Hypertension   . Melanoma of lower back ([place]) late 1990's  . Memory loss   . Myocardial infarction ([place]) 2001   x 1, confirned 1 possible     Assessment: Pharmacy consulted for heparin dosing and monitoring in 84 yo male for A. Fib.   Goal of Therapy:  Heparin level 0.3-0.7 units/ml Monitor platelets by anticoagulation protocol: Yes   Plan:  HL = 0.59 is therapeutic. Continue heparin infusion at current rate of 1450 units/hr and order confirmatory HL in 6 hours.  CBC ordered with AM labs tomorrow  04/10 @ 0500 HL 0.92 supratherapeutic. Per RN patient not bleeding, but having a bit of weeping on dressing change. Will reduce rate to 1250 units/hr and will recheck next HL @ 1300.  Tobie Lords, PharmD, BCPS Clinical Pharmacist 01/24/2018 6:14 AM

## 2018-01-24 NOTE — Progress Notes (Signed)
Patient has rested quietly this shift. Pain managed with PRNs. Family at bedside most of the day. CSW working on bed placement for patient's discharge.

## 2018-01-24 NOTE — Progress Notes (Signed)
Progress Note  Patient Name: [name] Date of Encounter: 01/24/2018  Primary Cardiologist: Dorris Carnes, MD   Subjective   Sleeping this morning, not conversant Family at the bedside Telemetry reviewed showing conversion to normal sinus rhythm around midnight Heart rate 60 regular  Inpatient Medications    Scheduled Meds: . amiodarone  200 mg Oral BID  . amLODipine  5 mg Oral BID  . aspirin EC  81 mg Oral Daily  . atorvastatin  80 mg Oral Daily  . cephALEXin  500 mg Oral Q8H  . collagenase   Topical BID  . feeding supplement (ENSURE ENLIVE)  237 mL Oral BID BM  . metoprolol tartrate  25 mg Oral BID  . multivitamin with minerals  1 tablet Oral Daily  . pantoprazole  40 mg Oral BID  . tamsulosin  0.4 mg Oral Daily   Continuous Infusions: . sodium chloride 75 mL/hr at 01/24/18 0021  . heparin 1,250 Units/hr (01/24/18 0653)   PRN Meds: acetaminophen **OR** acetaminophen, HYDROcodone-acetaminophen, LORazepam, morphine injection, ondansetron **OR** ondansetron (ZOFRAN) IV   Vital Signs    Vitals:   01/23/18 1544 01/24/18 0510 01/24/18 0816 01/24/18 0925  BP: 115/82 (!) 155/74 (!) 173/86 (!) 175/84  Pulse: (!) 106 61 65 60  Resp: 14 18 18    Temp: 98.9 F (37.2 C) 97.8 F (36.6 C) 98.4 F (36.9 C)   TempSrc: Oral Oral Oral   SpO2: 97% 97% 98%   Weight:        Intake/Output Summary (Last 24 hours) at 01/24/2018 0933 Last data filed at 01/24/2018 0400 Gross per 24 hour  Intake 1050 ml  Output -  Net 1050 ml   Filed Weights   01/21/18 0224  Weight: 148 lb (67.1 kg)    Telemetry    NSR - Personally Reviewed  ECG      Physical Exam   GEN: No acute distress.  Laying on his right side, sleeping Neck: No JVD Cardiac: RRR, no murmurs, rubs, or gallops.  Respiratory: Clear to auscultation bilaterally. GI: Soft,  non-distended  MS: No edema; No deformity. Neuro: Unable to test Psych: Sleeping  Labs    Chemistry Recent Labs  Lab 01/20/18 2036   01/21/18 0858 01/21/18 1251 01/23/18 0508 01/24/18 0510  NA 137  --  137  --  140 142  K 3.7  --  4.5 3.8 3.8 4.0  CL 101  --  106  --  109 109  CO2 26  --  26  --  27 29  GLUCOSE 171*  --  133*  --  149* 142*  BUN 40*  --  40*  --  35* 33*  CREATININE 0.95   < > 0.94  --  0.76 0.78  CALCIUM 8.4*  --  8.1*  --  8.2* 8.6*  PROT 6.5  --   --   --   --   --   ALBUMIN 3.0*  --   --   --   --   --   AST 45*  --   --   --   --   --   ALT 36  --   --   --   --   --   ALKPHOS 76  --   --   --   --   --   BILITOT 1.7*  --   --   --   --   --   GFRNONAA >60   < > >  60  --  >60 >60  GFRAA >60   < > >60  --  >60 >60  ANIONGAP 10  --  5  --  4* 4*   < > = values in this interval not displayed.     Hematology Recent Labs  Lab 01/21/18 0858 01/22/18 0142 01/24/18 0510  WBC 12.7* 11.1* 10.3  RBC 3.74* 3.37* 3.63*  HGB 11.2* 10.1* 10.9*  HCT 33.7* 30.4* 33.1*  MCV 90.2 90.2 91.1  MCH 30.0 30.1 30.0  MCHC 33.2 33.4 32.9  RDW 13.9 14.1 14.1  PLT 217 214 298    Cardiac Enzymes Recent Labs  Lab 01/20/18 2036 01/21/18 0250 01/21/18 0858 01/21/18 1251  TROPONINI 0.25* 0.21* 0.17* 0.19*   No results for input(s): TROPIPOC in the last 168 hours.   BNPNo results for input(s): BNP, PROBNP in the last 168 hours.   DDimer No results for input(s): DDIMER in the last 168 hours.   Radiology    No results found.  Cardiac Studies   84 y.o. male - Left ventricle: The cavity size was normal. Wall thickness was   normal. Systolic function was normal. The estimated ejection   fraction was in the range of 55% to 65%. - Aortic valve: A bicuspid morphology cannot be excluded; mildly   thickened, mildly calcified leaflets. Transvalvular velocity was   within the normal range. There was no stenosis. There was no   significant regurgitation. - Right ventricle: The cavity size was normal. Systolic function   was normal. - Pulmonary arteries: Systolic pressure was at least mildly    increased, estimated to be 35 mm Hg plus central venous pressure.  Patient Profile     [name] is an 84 year old man admitted with fall and prolonged downtime complicated by large sacral/back decubitus ulcer, rhabdomyolysis, and paroxysmal atrial fibrillation.     Assessment & Plan     1.  PAF with RVR: Converted to normal sinus rhythm around midnight We will decrease amiodarone down to 200 mg twice daily, metoprolol tartrate down to 25 twice daily given low heart rate We will continue heparin infusion for now This can likely be discontinued at the time of discharge given he is not a good long-term candidate for anticoagulation -Follow-up in clinic if possible Would plan on decreasing amiodarone down to 200 mg daily in around 2 weeks  2.  Rhabdomyolysis: -Found down on the floor by family down for unknown amount of time,  -Noted to have abrasions/ulcerations to his coccyx and left upper back Note indicating laundry detergent has burned his back  visiting nurse To help with wound dressings at home  3.  Elevated troponin/CAD: -Troponin mildly elevated in the setting of his PAF with RVR and rhabdomyolysis with a peak of 0.25 Not a good candidate for ischemia workup given severe comorbid conditions including dementia and advanced age  -Continue medical therapy  Long discussion with family concerning dementia, home care, rehab  Total encounter time more than 35 minutes  Greater than 50% was spent in counseling and coordination of care with the patient   For questions or updates, please contact [facility] Please consult [email or web address] for contact info under Cardiology/STEMI.      Signed, Ida Rogue, MD  01/24/2018, 9:33 AM  \

## 2018-01-24 NOTE — Clinical Social Work Note (Addendum)
CSW has began SNF bed search awaiting for bed offers.  CSW attempted to contact patient's family to discuss bed offers, CSW had to leave a message on voice mail, awaiting for call back.  Jones Broom. Norval Morton, MSW, [place]  01/24/2018 12:37 PM

## 2018-01-24 NOTE — Progress Notes (Signed)
[facility] NAME: [name]    MR#:  [id number]  DATE OF BIRTH:  September 19, 1931  SUBJECTIVE:  Family at bedside patient received Ativan this morning for agitation and is now sleeping  Patient converted to normal sinus rhythm last night  REVIEW OF SYSTEMS:  Unable to obtain due to dementia and patient is sleeping  Tolerating Diet: yes      DRUG ALLERGIES:   Allergies  Allergen Reactions  . Budesonide-Formoterol Fumarate Swelling    Mouth and tongue swelling.  . Iohexol Shortness Of Breath and Itching    Pt was given 100cc of Omnipaque 300 followed by itching/ dyspnea.  . Simvastatin Other (See Comments)    REACTION: unknown  . Demerol [Meperidine] Other (See Comments)    REACTION: Hallucinations  . Ivp Dye [Iodinated Diagnostic Agents] Other (See Comments)    Iodine pt had a reaction when he had a CT DONE  . Meperidine Hcl Other (See Comments)    REACTION: Hallucinations  . Tape Other (See Comments)    Tears skin off, Please use "paper" tape  . Naproxen Swelling  . Pregabalin Rash  . Sulfonamide Derivatives Rash    VITALS:  Blood pressure (!) 175/84, pulse 60, temperature 98.4 F (36.9 C), temperature source Oral, resp. rate 18, weight 67.1 kg (148 lb), SpO2 98 %.  PHYSICAL EXAMINATION:  Constitutional: Elderly patient lying in bed  eyes: Conjunctivae and EOM are normal. PERRLA, no scleral icterus.  Neck: Normal ROM. Neck supple. No JVD. No tracheal deviation. CVS: RRR, S1/S2 +, no murmurs, no gallops, no carotid bruit.  Pulmonary: Effort and breath sounds normal, no stridor, rhonchi, wheezes, rales.  Abdominal: Soft. BS +,  no distension, tenderness, rebound or guarding.  Musculoskeletal: Normal range of motion. No edema and no tenderness.  Neuro: Currently sleeping and coccygeal pressure injjury Skin: Noted to have open wounds posterior left shoulder Psychiatric: dementia     LABORATORY PANEL:    CBC Recent Labs  Lab 01/24/18 0510  WBC 10.3  HGB 10.9*  HCT 33.1*  PLT 298   ------------------------------------------------------------------------------------------------------------------  Chemistries  Recent Labs  Lab 01/20/18 2036  01/21/18 1251  01/24/18 0510  NA 137   < >  --    < > 142  K 3.7   < > 3.8   < > 4.0  CL 101   < >  --    < > 109  CO2 26   < >  --    < > 29  GLUCOSE 171*   < >  --    < > 142*  BUN 40*   < >  --    < > 33*  CREATININE 0.95   < >  --    < > 0.78  CALCIUM 8.4*   < >  --    < > 8.6*  MG  --   --  2.2  --   --   AST 45*  --   --   --   --   ALT 36  --   --   --   --   ALKPHOS 76  --   --   --   --   BILITOT 1.7*  --   --   --   --    < > = values in this interval not displayed.   ------------------------------------------------------------------------------------------------------------------  Cardiac Enzymes Recent Labs  Lab 01/21/18 0250 01/21/18 0858 01/21/18 1251  TROPONINI 0.21* 0.17* 0.19*   ------------------------------------------------------------------------------------------------------------------  RADIOLOGY:  No results found.   ASSESSMENT AND PLAN:    84 year old male with history of dementia and CAD who presented with syncope.  1.  Syncope: This was due to mechanical fall and weakness. MRI of the brain was negative for acute findings   2.  PAF: Patient is now converted to normal sinus rhythm Continue amiodarone 200 mg and metoprolol Patient is not a candidate for anticoagulation due to fall risk and dementia Patient will follow up with cardiology as an outpatient.  3.  Acute rhabdomyolysis: Patient was found down on the floor by family for an unknown amount of time  4.  Elevated troponin: This is due to demand ischemia.  Patient ruled out for ACS.  5.Contact dermatitis and tissue damage/loss due to prolonged exposure with detergent/soap  Continue management as per recommended by wound  care      Management plans discussed with the patient's family and they are is in agreement.  CODE STATUS: DNR  TOTAL TIME TAKING CARE OF THIS PATIENT: 25 minutes.     POSSIBLE D/C today, DEPENDING ON PT consult Docie Abramovich M.D on 01/24/2018 at 10:41 AM  Between 7am to 6pm - Pager - [phone] After 6pm go to [email or web address] - password [email or web address] [facility]  Office  [phone]  CC: Primary care physician; [name], FNP  Note: This dictation was prepared with Dragon dictation along with smaller phrase technology. Any transcriptional errors that result from this process are unintentional.

## 2018-01-24 NOTE — Discharge Summary (Signed)
[facility] NAME: Fredrich Cory    MR#:  [id number]  DATE OF BIRTH:  06-11-1931  DATE OF ADMISSION:  01/20/2018 ADMITTING PHYSICIAN: Lance Coon, MD  DATE OF DISCHARGE: 01/24/2018  PRIMARY CARE PHYSICIAN: [name], FNP    ADMISSION DIAGNOSIS:  Cough [R05] Elevated troponin [R74.8] Skin breakdown [L90.9] Fall, initial encounter [W19.XXXA]  DISCHARGE DIAGNOSIS:  Principal Problem:   Unresponsive episode Active Problems:   Hyperlipidemia   CAD (coronary artery disease)   GERD   Essential hypertension   Elevated troponin   Leukocytosis   Atrial fibrillation (HCC)   Malnutrition of moderate degree   Pressure injury of skin   SECONDARY DIAGNOSIS:   Past Medical History:  Diagnosis Date  . Arthritis    "hips, back" (06/17/2015)  . Asthma   . Chronic chest pain   . Chronic lower back pain   . Coronary artery disease    a. s/p BMS to RCA in 2002; b. s/p cutting balloon POBA ;   c. cath 6/12: oDx 80% (treated with repeat cutting balloon POBA), mLAD 50% with 30-40% at Dx, CFX 30%, pRCA 25% with patent stents;  d.  Lex MV 4/14:  Low Risk - EF 61%, inf scar with peri-infarct ischemia  . Dyspnea    chronic  . GERD (gastroesophageal reflux disease)    h/o esophageal spasm  . Headache   . History of blood transfusion    "related to OR"  . History of hiatal hernia   . Hyperlipidemia   . Hypertension   . Melanoma of lower back ([place]) late 1990's  . Memory loss   . Myocardial infarction ([place]) 2001   x 1, confirned 1 possible    HOSPITAL COURSE:   84 year old male with history of dementia and CAD who presented with syncope.  1.  Syncope: This was due to mechanical fall and weakness. MRI of the brain was negative for acute findings   2.  PAF: Patient is now converted to normal sinus rhythm Continue amiodarone 200 mg and metoprolol Patient is not a candidate for anticoagulation due to fall risk and  dementia Patient will follow up with cardiology as an outpatient.  3.  Acute rhabdomyolysis: Patient was found down on the floor by family for an unknown amount of time  4.  Elevated troponin: This is due to demand ischemia.  Patient ruled out for ACS.  5.Contact dermatitis and tissue damage/loss due to prolonged exposure with detergent/soap  Continue management as per recommended by wound care  6.  BPH: Continue Cardura and Flomax  DISCHARGE CONDITIONS AND DIET:   Stable condition on dysphagia 3 diet   CONSULTS OBTAINED:  Treatment Team:  Sherren Mocha, MD Leonel Ramsay, MD  DRUG ALLERGIES:   Allergies  Allergen Reactions  . Budesonide-Formoterol Fumarate Swelling    Mouth and tongue swelling.  . Iohexol Shortness Of Breath and Itching    Pt was given 100cc of Omnipaque 300 followed by itching/ dyspnea.  . Simvastatin Other (See Comments)    REACTION: unknown  . Demerol [Meperidine] Other (See Comments)    REACTION: Hallucinations  . Ivp Dye [Iodinated Diagnostic Agents] Other (See Comments)    Iodine pt had a reaction when he had a CT DONE  . Meperidine Hcl Other (See Comments)    REACTION: Hallucinations  . Tape Other (See Comments)    Tears skin off, Please use "paper" tape  . Naproxen Swelling  . Pregabalin  Rash  . Sulfonamide Derivatives Rash    DISCHARGE MEDICATIONS:   Allergies as of 01/24/2018      Reactions   Budesonide-formoterol Fumarate Swelling   Mouth and tongue swelling.   Iohexol Shortness Of Breath, Itching   Pt was given 100cc of Omnipaque 300 followed by itching/ dyspnea.   Simvastatin Other (See Comments)   REACTION: unknown   Demerol [meperidine] Other (See Comments)   REACTION: Hallucinations   Ivp Dye [iodinated Diagnostic Agents] Other (See Comments)   Iodine pt had a reaction when he had a CT DONE   Meperidine Hcl Other (See Comments)   REACTION: Hallucinations   Tape Other (See Comments)   Tears skin off, Please use  "paper" tape   Naproxen Swelling   Pregabalin Rash   Sulfonamide Derivatives Rash      Medication List    STOP taking these medications   isosorbide mononitrate 60 MG 24 hr tablet Commonly known as:  IMDUR   metoprolol tartrate 25 MG tablet Commonly known as:  LOPRESSOR   nitroGLYCERIN 0.4 MG SL tablet Commonly known as:  NITROSTAT     TAKE these medications   acetaminophen 325 MG tablet Commonly known as:  TYLENOL Take 2 tablets (650 mg total) by mouth every 4 (four) hours as needed for headache or mild pain.   amiodarone 200 MG tablet Commonly known as:  PACERONE Take 1 tablet (200 mg total) by mouth 2 (two) times daily.   aspirin EC 81 MG tablet Take 1 tablet (81 mg total) by mouth daily.   atorvastatin 80 MG tablet Commonly known as:  LIPITOR Take 1 tablet (80 mg total) by mouth daily.   cephALEXin 500 MG capsule Commonly known as:  KEFLEX Take 1 capsule (500 mg total) by mouth every 8 (eight) hours for 7 days.   collagenase ointment Commonly known as:  SANTYL Apply topically 2 (two) times daily.   cyanocobalamin 1000 MCG tablet Take 1 tablet (1,000 mcg total) by mouth daily.   cyclobenzaprine 5 MG tablet Commonly known as:  FLEXERIL Take 7.5 mg by mouth 3 (three) times daily as needed for muscle spasms.   doxazosin 1 MG tablet Commonly known as:  CARDURA TAKE 1 TABLET BY MOUTH ONCE DAILY.   feeding supplement (ENSURE ENLIVE) Liqd Take 237 mLs by mouth 2 (two) times daily between meals. What changed:  Another medication with the same name was added. Make sure you understand how and when to take each.   feeding supplement (ENSURE ENLIVE) Liqd Take 237 mLs by mouth 2 (two) times daily between meals. What changed:  You were already taking a medication with the same name, and this prescription was added. Make sure you understand how and when to take each.   HYDROcodone-acetaminophen 7.5-325 MG tablet Commonly known as:  NORCO Take 1 tablet by mouth  every 4 (four) hours as needed for moderate pain. FOR PAIN   pantoprazole 40 MG tablet Commonly known as:  PROTONIX TAKE 1 TABLET BY MOUTH TWICE DAILY   tamsulosin 0.4 MG Caps capsule Commonly known as:  FLOMAX Take 0.4 mg by mouth daily.            Discharge Care Instructions  (From admission, onward)        Start     Ordered   01/24/18 0000  Discharge wound care:    Comments:  Topical wound care to IAD is in place, patent is being cleansed following incontinence episodes with house skin cleansing and moisturizing cloths.that  leave a moisture barrier residue. Topical care for the chemical injury to the back and shoulders will be twice daily with NS cleanse and placement of xeroform wound contact layer dressing (antimicrobial and astringent properties) with extra large ABDs to cover and Kerlix to secure. The coccygeal area Unstageable pressure injury will be treated with an enzymatic debriding agent (collagenase (Santyl) topped with NS dressings and changed twice daily.   01/24/18 1054        Today   CHIEF COMPLAINT:   Family at bedside.  Patient is converted to sinus rhythm   VITAL SIGNS:  Blood pressure (!) 175/84, pulse 60, temperature 98.4 F (36.9 C), temperature source Oral, resp. rate 18, weight 67.1 kg (148 lb), SpO2 98 %.   REVIEW OF SYSTEMS:  Review of Systems  Unable to perform ROS: Dementia     PHYSICAL EXAMINATION:  Constitutional: Elderly patient lying in bed  eyes: Conjunctivae and EOM are normal. PERRLA, no scleral icterus.  Neck: Normal ROM. Neck supple. No JVD. No tracheal deviation. CVS: RRR, S1/S2 +, no murmurs, no gallops, no carotid bruit.  Pulmonary: Effort and breath sounds normal, no stridor, rhonchi, wheezes, rales.  Abdominal: Soft. BS +,  no distension, tenderness, rebound or guarding.  Musculoskeletal: Normal range of motion. No edema and no tenderness.  Neuro: Currently sleeping and coccygeal pressure injjury Skin: Noted to have  open wounds posterior left shoulder Psychiatric: dementia     DATA REVIEW:   CBC Recent Labs  Lab 01/24/18 0510  WBC 10.3  HGB 10.9*  HCT 33.1*  PLT 298    Chemistries  Recent Labs  Lab 01/20/18 2036  01/21/18 1251  01/24/18 0510  NA 137   < >  --    < > 142  K 3.7   < > 3.8   < > 4.0  CL 101   < >  --    < > 109  CO2 26   < >  --    < > 29  GLUCOSE 171*   < >  --    < > 142*  BUN 40*   < >  --    < > 33*  CREATININE 0.95   < >  --    < > 0.78  CALCIUM 8.4*   < >  --    < > 8.6*  MG  --   --  2.2  --   --   AST 45*  --   --   --   --   ALT 36  --   --   --   --   ALKPHOS 76  --   --   --   --   BILITOT 1.7*  --   --   --   --    < > = values in this interval not displayed.    Cardiac Enzymes Recent Labs  Lab 01/21/18 0250 01/21/18 0858 01/21/18 1251  TROPONINI 0.21* 0.17* 0.19*    Microbiology Results  @MICRORSLT48 @  RADIOLOGY:  No results found.    Allergies as of 01/24/2018      Reactions   Budesonide-formoterol Fumarate Swelling   Mouth and tongue swelling.   Iohexol Shortness Of Breath, Itching   Pt was given 100cc of Omnipaque 300 followed by itching/ dyspnea.   Simvastatin Other (See Comments)   REACTION: unknown   Demerol [meperidine] Other (See Comments)   REACTION: Hallucinations   Ivp Dye [iodinated Diagnostic Agents] Other (See Comments)  Iodine pt had a reaction when he had a CT DONE   Meperidine Hcl Other (See Comments)   REACTION: Hallucinations   Tape Other (See Comments)   Tears skin off, Please use "paper" tape   Naproxen Swelling   Pregabalin Rash   Sulfonamide Derivatives Rash      Medication List    STOP taking these medications   isosorbide mononitrate 60 MG 24 hr tablet Commonly known as:  IMDUR   metoprolol tartrate 25 MG tablet Commonly known as:  LOPRESSOR   nitroGLYCERIN 0.4 MG SL tablet Commonly known as:  NITROSTAT     TAKE these medications   acetaminophen 325 MG tablet Commonly known as:   TYLENOL Take 2 tablets (650 mg total) by mouth every 4 (four) hours as needed for headache or mild pain.   amiodarone 200 MG tablet Commonly known as:  PACERONE Take 1 tablet (200 mg total) by mouth 2 (two) times daily.   aspirin EC 81 MG tablet Take 1 tablet (81 mg total) by mouth daily.   atorvastatin 80 MG tablet Commonly known as:  LIPITOR Take 1 tablet (80 mg total) by mouth daily.   cephALEXin 500 MG capsule Commonly known as:  KEFLEX Take 1 capsule (500 mg total) by mouth every 8 (eight) hours for 7 days.   collagenase ointment Commonly known as:  SANTYL Apply topically 2 (two) times daily.   cyanocobalamin 1000 MCG tablet Take 1 tablet (1,000 mcg total) by mouth daily.   cyclobenzaprine 5 MG tablet Commonly known as:  FLEXERIL Take 7.5 mg by mouth 3 (three) times daily as needed for muscle spasms.   doxazosin 1 MG tablet Commonly known as:  CARDURA TAKE 1 TABLET BY MOUTH ONCE DAILY.   feeding supplement (ENSURE ENLIVE) Liqd Take 237 mLs by mouth 2 (two) times daily between meals. What changed:  Another medication with the same name was added. Make sure you understand how and when to take each.   feeding supplement (ENSURE ENLIVE) Liqd Take 237 mLs by mouth 2 (two) times daily between meals. What changed:  You were already taking a medication with the same name, and this prescription was added. Make sure you understand how and when to take each.   HYDROcodone-acetaminophen 7.5-325 MG tablet Commonly known as:  NORCO Take 1 tablet by mouth every 4 (four) hours as needed for moderate pain. FOR PAIN   pantoprazole 40 MG tablet Commonly known as:  PROTONIX TAKE 1 TABLET BY MOUTH TWICE DAILY   tamsulosin 0.4 MG Caps capsule Commonly known as:  FLOMAX Take 0.4 mg by mouth daily.            Discharge Care Instructions  (From admission, onward)        Start     Ordered   01/24/18 0000  Discharge wound care:    Comments:  Topical wound care to IAD is in  place, patent is being cleansed following incontinence episodes with house skin cleansing and moisturizing cloths.that leave a moisture barrier residue. Topical care for the chemical injury to the back and shoulders will be twice daily with NS cleanse and placement of xeroform wound contact layer dressing (antimicrobial and astringent properties) with extra large ABDs to cover and Kerlix to secure. The coccygeal area Unstageable pressure injury will be treated with an enzymatic debriding agent (collagenase (Santyl) topped with NS dressings and changed twice daily.   01/24/18 1054         Management plans discussed with the patient's  family and they are in agreement. Stable for discharge   Patient should follow up with pcp  CODE STATUS:     Code Status Orders  (From admission, onward)        Start     Ordered   01/21/18 0228  Do not attempt resuscitation (DNR)  Continuous    Question Answer Comment  In the event of cardiac or respiratory ARREST Do not call a "code blue"   In the event of cardiac or respiratory ARREST Do not perform Intubation, CPR, defibrillation or ACLS   In the event of cardiac or respiratory ARREST Use medication by any route, position, wound care, and other measures to relive pain and suffering. May use oxygen, suction and manual treatment of airway obstruction as needed for comfort.      01/21/18 0227    Code Status History    Date Active Date Inactive Code Status Order ID Comments User Context   07/31/2017 2140 08/02/2017 1405 DNR [id number]  Harrie Foreman, MD Inpatient   06/19/2017 0723 06/20/2017 2230 DNR [id number]  Rise Patience, MD Inpatient   06/18/2017 2357 06/19/2017 0723 Full Code [id number]  Rise Patience, MD ED   11/14/2015 1651 11/15/2015 1921 Full Code [id number]  Dorothy Spark, MD Inpatient   06/17/2015 1727 06/18/2015 2103 Full Code [id number]  Francesca Oman, DO Inpatient   03/03/2014 0212 03/05/2014 1758 Full Code [id number]  Bynum Bellows, MD Inpatient   01/13/2014 1324 01/13/2014 1843 Full Code [id number]  Sherren Mocha, MD Inpatient   12/14/2013 1627 12/16/2013 2055 Full Code [id number]  Branch, Alphonse Guild, MD ED    Advance Directive Documentation     Most Recent Value  Type of Advance Directive  Out of facility DNR (pink MOST or yellow form)  Pre-existing out of facility DNR order (yellow form or pink MOST form)  Physician notified to receive inpatient order  "MOST" Form in Place?  -      TOTAL TIME TAKING CARE OF THIS PATIENT: 38 minutes.    Note: This dictation was prepared with Dragon dictation along with smaller phrase technology. Any transcriptional errors that result from this process are unintentional.  Keiran Sias M.D on 01/24/2018 at 10:57 AM  Between 7am to 6pm - Pager - [phone] After 6pm go to [email or web address] - password [email or web address] [facility]  Office  [phone]  CC: Primary care physician; [name], FNP

## 2018-01-24 NOTE — NC FL2 (Signed)
[place] LEVEL OF CARE SCREENING TOOL     IDENTIFICATION  Patient Name: [name] Birthdate: 1931-07-23 Sex: male Admission Date (Current Location): 01/20/2018  [place] and [place] Number:  Engineering geologist and Address:  [facility], [address], [place], [place] [address]      Provider Number: [id number]  Attending Physician Name and Address:  Bettey Costa, MD  Relative Name and Phone Number:  Maryan Rued Daughter [phone] or [name], [name]   [phone]     Current Level of Care: Hospital Recommended Level of Care: [place] Prior Approval Number:    Date Approved/Denied:   PASRR Number: [id number]  Discharge Plan: SNF    Current Diagnoses: Patient Active Problem List   Diagnosis Date Noted  . Malnutrition of moderate degree 01/22/2018  . Pressure injury of skin 01/22/2018  . Atrial fibrillation ([place]) 01/21/2018  . Unresponsive episode 01/20/2018  . Elevated troponin 01/20/2018  . Leukocytosis 01/20/2018  . Pre-syncope 07/31/2017  . B12 deficiency 06/19/2017  . Acute encephalopathy 06/18/2017  . Essential hypertension   . Bladder outlet obstruction   . GI bleed 03/03/2014  . Anemia 03/03/2014  . Generalized weakness 03/03/2014  . ESOPHAGEAL STRICTURE 09/20/2010  . ORTHOSTATIC DIZZINESS 08/30/2010  . IBS 08/26/2010  . RECTAL BLEEDING 08/26/2010  . RECTAL PAIN 08/26/2010  . ARTHRITIS 08/26/2010  . LOSS OF APPETITE 08/26/2010  . OTHER DYSPHAGIA 08/26/2010  . Abdominal pain, generalized 08/26/2010  . COLONIC POLYPS, HX OF 08/26/2010  . ANAL FISSURE, HX OF 08/26/2010  . PALPITATIONS, HX OF 08/18/2010  . PERIPHERAL NEUROPATHY 05/01/2009  . History of myocardial infarction 05/01/2009  . ALLERGIC RHINITIS, SEASONAL 05/01/2009  . DEPRESSION, HX OF 05/01/2009  . Personal history of other diseases of digestive system 05/01/2009  . NEPHROLITHIASIS, HX OF  05/01/2009  . BENIGN PROSTATIC HYPERTROPHY, HX OF 05/01/2009  . LAMINECTOMY, HX OF 05/01/2009  . Hyperlipidemia 10/20/2008  . CAD (coronary artery disease) 10/20/2008  . ASTHMA, UNSPECIFIED, UNSPECIFIED STATUS 10/20/2008  . GERD 10/20/2008    Orientation RESPIRATION BLADDER Height & Weight     Self  Normal Incontinent Weight: 148 lb (67.1 kg) Height:     BEHAVIORAL SYMPTOMS/MOOD NEUROLOGICAL BOWEL NUTRITION STATUS      Incontinent Diet  AMBULATORY STATUS COMMUNICATION OF NEEDS Skin   Limited Assist Verbally Skin abrasions, PU Stage and Appropriate Care                       Personal Care Assistance Level of Assistance  Bathing, Feeding, Dressing Bathing Assistance: Limited assistance Feeding assistance: Limited assistance Dressing Assistance: Independent     Functional Limitations Info  Sight, Hearing, Speech Sight Info: Adequate Hearing Info: Adequate Speech Info: Adequate    SPECIAL CARE FACTORS FREQUENCY  PT (By licensed PT)     PT Frequency: 5x a week              Contractures Contractures Info: Not present    Additional Factors Info  Code Status, Allergies Code Status Info: DNR Allergies Info: BUDESONIDE-FORMOTEROL FUMARATE, IOHEXOL, SIMVASTATIN, DEMEROL MEPERIDINE, IVP DYE IODINATED DIAGNOSTIC AGENTS, MEPERIDINE HCL, TAPE, NAPROXEN, PREGABALIN, SULFONAMIDE DERIVATIVES            Current Medications (01/24/2018):  This is the current hospital active medication list Current Facility-Administered Medications  Medication Dose Route Frequency Provider Last Rate Last Dose  . 0.9 %  sodium chloride infusion   Intravenous Continuous Gladstone Lighter, MD 75 mL/hr  at 01/24/18 0021    . acetaminophen (TYLENOL) tablet 650 mg  650 mg Oral Q6H PRN Lance Coon, MD       Or  . acetaminophen (TYLENOL) suppository 650 mg  650 mg Rectal Q6H PRN Lance Coon, MD      . amiodarone (PACERONE) tablet 200 mg  200 mg Oral BID Minna Merritts, MD   200 mg at  01/24/18 0935  . amLODipine (NORVASC) tablet 5 mg  5 mg Oral BID Minna Merritts, MD   5 mg at 01/24/18 0935  . aspirin EC tablet 81 mg  81 mg Oral Daily Lance Coon, MD   81 mg at 01/24/18 0935  . atorvastatin (LIPITOR) tablet 80 mg  80 mg Oral Daily Lance Coon, MD   80 mg at 01/24/18 0934  . cephALEXin (KEFLEX) capsule 500 mg  500 mg Oral Q8H Leonel Ramsay, MD   500 mg at 01/24/18 0511  . collagenase (SANTYL) ointment   Topical BID Gladstone Lighter, MD      . feeding supplement (ENSURE ENLIVE) (ENSURE ENLIVE) liquid 237 mL  237 mL Oral BID BM Mody, Sital, MD   237 mL at 01/24/18 1009  . HYDROcodone-acetaminophen (NORCO) 7.5-325 MG per tablet 1 tablet  1 tablet Oral Q4H PRN Lance Coon, MD   1 tablet at 01/24/18 0935  . LORazepam (ATIVAN) injection 0.5 mg  0.5 mg Intravenous Q6H PRN Gladstone Lighter, MD   0.5 mg at 01/24/18 0511  . metoprolol tartrate (LOPRESSOR) tablet 25 mg  25 mg Oral BID Minna Merritts, MD      . morphine 2 MG/ML injection 2 mg  2 mg Intravenous Q4H PRN Lance Coon, MD   2 mg at 01/24/18 0410  . multivitamin with minerals tablet 1 tablet  1 tablet Oral Daily Bettey Costa, MD   1 tablet at 01/24/18 0934  . ondansetron (ZOFRAN) tablet 4 mg  4 mg Oral Q6H PRN Lance Coon, MD       Or  . ondansetron [facility]) injection 4 mg  4 mg Intravenous Q6H PRN Lance Coon, MD      . pantoprazole (PROTONIX) EC tablet 40 mg  40 mg Oral BID Lance Coon, MD   40 mg at 01/24/18 0935  . tamsulosin (FLOMAX) capsule 0.4 mg  0.4 mg Oral Daily Lance Coon, MD   0.4 mg at 01/24/18 0934  . witch hazel-glycerin (TUCKS) pad   Topical PRN Bettey Costa, MD         Discharge Medications: Please see discharge summary for a list of discharge medications.  Relevant Imaging Results:  Relevant Lab Results:   Additional Information SSN [id number]  Ross Ludwig, [place]

## 2018-01-24 NOTE — Progress Notes (Signed)
Physical Therapy Evaluation Patient Details Name: [name] MRN: [id number] DOB: [birth year] Today's Date: 01/24/2018   History of Present Illness  [name]  is a 84 y.o. male who presents with all at home and unresponsive episode.  Patient was found on the floor in his kitchen, down for an unknown amount of time.  Here in the ED he was able to wake up and respond to some degree.  He was found to have pressure sore on his shoulder.  His initial workup showed leukocytosis, elevated troponin.  Unclear etiology for his fall and time down.  He had some elevation of his CK, though he is not in rhabdo and his kidney function was okay.  He is somewhat sleepy on exam and does not contribute much information is HPI, though he is arousable.  Family at bedside contributes information.  Hospitalist were called for admission and further evaluation. Pt now admitted for syncope/fall, a-fib, skin wounds, and AMS secondary to metabolic encephalopathy.  Clinical Impression  Pt admitted with above diagnosis. Pt currently with functional limitations due to the deficits listed below (see PT Problem List).  Pt requires heavy cues for sequencing with bed mobility. He requires modA+1 for LUE and trunk assist. Once legs are off bed pt provides significant self-assist in coming upright to sitting. Care taken to avoid disrupting wound dressings on L upper back. Pt requires modA+2 for sit to stand transfers. Heavy cues and hand over hand assist for proper hand placement during transfers. Once upright pt only requires minA+1 to remain upright. Attempted marching. Pt able to shift weight but is too weak to clear his feet from the floor. Unable/unsafe to attempt ambulation at this time. At this time recommend SNF placement for patient. He is able to appropriately participate with therapy and his baseline is modified independence with ADLs/ambulation in the home with single point cane. Pt will benefit from PT services to address  deficits in strength, balance, and mobility in order to return to full function at home.     Follow Up Recommendations SNF    Equipment Recommendations  Rolling walker with 5" wheels;Other (comment)(TBD at next venue)    Recommendations for Other Services       Precautions / Restrictions Precautions Precautions: Fall Restrictions Weight Bearing Restrictions: No      Mobility  Bed Mobility Overal bed mobility: Needs Assistance Bed Mobility: Supine to Sit     Supine to sit: Mod assist;+2 for physical assistance     General bed mobility comments: Pt requires heavy cues for sequencing with bed mobility. He requires modA+1 for LUE and trunk assist. Once legs are off bed pt provides significant assist in coming upright to sitting. Care taken to avoid disrupting wound dressings on L upper back  Transfers Overall transfer level: Needs assistance Equipment used: Rolling walker (2 wheeled) Transfers: Sit to/from Stand Sit to Stand: Mod assist;+2 physical assistance         General transfer comment: Pt requires modA+2 for sit to stand transfers. Heavy cues and hand over hand assist for proper hand placement during transfers. Once upright pt only requires minA+1 to remain upright. Attempted marching. Pt able to shift weight but is too weak to clear his feet from the floor. Unable/unsafe to attempt ambulation at this time.   Ambulation/Gait                Financial trader  [employer] (Stroke Patients Only)       Balance Overall balance assessment: Needs assistance Sitting-balance support: No upper extremity supported Sitting balance-Leahy Scale: Fair     Standing balance support: Bilateral upper extremity supported Standing balance-Leahy Scale: Poor Standing balance comment: Requires minA+1 to remain in standing with bilateral UE support on rolling walker                             Pertinent Vitals/Pain Pain  Assessment: Faces Faces Pain Scale: Hurts even more Pain Location: L upper back, L upper arm secondary to wound Pain Intervention(s): Monitored during session    Home Living Family/patient expects to be discharged to:: Unsure Living Arrangements: Alone Available Help at Discharge: Family;Available PRN/intermittently Type of Home: House Home Access: Stairs to enter Entrance Stairs-Rails: None Entrance Stairs-Number of Steps: 3 Home Layout: One level Home Equipment: Cane - single point;Shower seat(No walker)      Prior Function Level of Independence: Needs assistance   Gait / Transfers Assistance Needed: Pt was independent for ambulation at home with spc prior to admission. 2 falls (including current) in the last 12 months  ADL's / Homemaking Assistance Needed: Independent with ADLs but requiring assist for IADLs        Hand Dominance   Dominant Hand: Right    Extremity/Trunk Assessment   Upper Extremity Assessment Upper Extremity Assessment: Generalized weakness    Lower Extremity Assessment Lower Extremity Assessment: Generalized weakness       Communication   Communication: No difficulties  Cognition Arousal/Alertness: Lethargic Behavior During Therapy: Flat affect Overall Cognitive Status: Impaired/Different from baseline Area of Impairment: Orientation;Following commands                 Orientation Level: Disoriented to;Place;Time;Situation     Following Commands: Follows one step commands inconsistently       General Comments: Approximately 60-70% simple one step command follow      General Comments      Exercises     Assessment/Plan    PT Assessment Patient needs continued PT services  PT Problem List Decreased strength;Decreased activity tolerance;Decreased balance;Decreased mobility;Decreased cognition       PT Treatment Interventions DME instruction;Gait training;Stair training;Functional mobility training;Therapeutic  activities;Therapeutic exercise;Balance training;Patient/family education;Cognitive remediation;Neuromuscular re-education    PT Goals (Current goals can be found in the Care Plan section)  Acute Rehab PT Goals Patient Stated Goal: Return to independence with ADLs PT Goal Formulation: With family Time For Goal Achievement: 01/24/18 Potential to Achieve Goals: Good    Frequency Min 2X/week   Barriers to discharge Decreased caregiver support Lives alone but does have good support from family    Co-evaluation               AM-PAC PT "6 Clicks" Daily Activity  Outcome Measure Difficulty turning over in bed (including adjusting bedclothes, sheets and blankets)?: Unable Difficulty moving from lying on back to sitting on the side of the bed? : Unable Difficulty sitting down on and standing up from a chair with arms (e.g., wheelchair, bedside commode, etc,.)?: Unable Help needed moving to and from a bed to chair (including a wheelchair)?: Total Help needed walking in hospital room?: Total Help needed climbing 3-5 steps with a railing? : Total 6 Click Score: 6    End of Session Equipment Utilized During Treatment: Gait belt Activity Tolerance: Patient tolerated treatment well Patient left: in bed;with call bell/phone within reach Nurse Communication: Mobility status;Other (  comment)(Mech lift only or maxA+2 for bed to recliner) PT Visit Diagnosis: Unsteadiness on feet (R26.81);Muscle weakness (generalized) (M62.81);History of falling (Z91.81);Difficulty in walking, not elsewhere classified (R26.2)    Time: 2182-8833 PT Time Calculation (min) (ACUTE ONLY): 40 min   Charges:   PT Evaluation $PT Eval Moderate Complexity: 1 Mod PT Treatments $Therapeutic Activity: 8-22 mins   PT G Codes:        [name] [name] PT, DPT    [name] 01/24/2018, 12:32 PM

## 2018-01-24 NOTE — Clinical Social Work Note (Addendum)
CSW spoke to patient's son [name], who was at bedside.  CSW presented bed offers, patient son is going to talk with his siblings to make a decision and call CSW back.  CSW spoke to patient's son [name], and he chose [facility], Maury left message with admissions worker to inform her of the family's choice.  CSW updated physician that patient will not be able to discharge today due to SNF not being able to get meds for tonight.  Jones Broom. [place], MSW, [place]  01/24/2018 4:29 PM

## 2018-01-24 NOTE — Care Management Important Message (Signed)
Copy of signed IM left in patient's room.    

## 2018-01-24 NOTE — Progress Notes (Signed)
Dr. Rockey Situ aware of elevated BP and NSR rate of 60. Instructed not to give current PRN metoprolol dose. MD making changes to medications. Will continue to monitor.

## 2018-01-25 ENCOUNTER — Inpatient Hospital Stay: Payer: Medicare Other

## 2018-01-25 DIAGNOSIS — F0281 Dementia in other diseases classified elsewhere with behavioral disturbance: Secondary | ICD-10-CM

## 2018-01-25 DIAGNOSIS — G20 Parkinson's disease: Secondary | ICD-10-CM

## 2018-01-25 DIAGNOSIS — R509 Fever, unspecified: Secondary | ICD-10-CM

## 2018-01-25 DIAGNOSIS — E440 Moderate protein-calorie malnutrition: Secondary | ICD-10-CM

## 2018-01-25 LAB — URINALYSIS, ROUTINE W REFLEX MICROSCOPIC
Bacteria, UA: NONE SEEN
Bilirubin Urine: NEGATIVE
GLUCOSE, UA: NEGATIVE mg/dL
Ketones, ur: NEGATIVE mg/dL
Leukocytes, UA: NEGATIVE
Nitrite: NEGATIVE
PH: 7 (ref 5.0–8.0)
PROTEIN: NEGATIVE mg/dL
SQUAMOUS EPITHELIAL / LPF: NONE SEEN
Specific Gravity, Urine: 1.012 (ref 1.005–1.030)

## 2018-01-25 MED ORDER — ENOXAPARIN SODIUM 40 MG/0.4ML ~~LOC~~ SOLN
40.0000 mg | SUBCUTANEOUS | Status: DC
  Administered 2018-01-25 – 2018-01-26 (×2): 40 mg via SUBCUTANEOUS
  Filled 2018-01-25 (×2): qty 0.4

## 2018-01-25 MED ORDER — SODIUM CHLORIDE 0.9 % IV SOLN
INTRAVENOUS | Status: DC
  Administered 2018-01-25 – 2018-01-26 (×2): via INTRAVENOUS

## 2018-01-25 NOTE — Progress Notes (Signed)
Chaplain responded to a Rapid  Response. Ch located family (daughter) and saved space with her. Chaplain asked if she had a need and prayed with her. She said Pt had received blessing and asked for continual prayer.   01/25/18 1000  Clinical Encounter Type  Visited With Patient and family together  Visit Type Initial;Code  Referral From Nurse  Spiritual Encounters  Spiritual Needs Prayer;Emotional

## 2018-01-25 NOTE — Progress Notes (Signed)
Called [employer] at this time. Patient lethargic, minimally responsive, spiking a fever now of over 103 orally, refusing all AM PO medications. Blood pressure now over 924 systolic. See new orders. Will continue to closely monitor. Wenda Low [facility]

## 2018-01-25 NOTE — Progress Notes (Signed)
Progress Note  Patient Name: [name] Date of Encounter: 01/25/2018  Primary Cardiologist: [name] Carnes, MD   Subjective   Sleeping this morning, not conversant Family at the bedside Not eating very much maybe 2 cans of ensure Episode of tachycardia, flushing, felt hot, rigors, hypertension this morning starting around 530 going until after 8 am Worse 7-8 am Heart rate back down to normal rate of 60, did reach a peak of 120 beats or more on telemetry Maintaining normal sinus rhythm throughout, sinus tachycardia at 8 AM  Inpatient Medications    Scheduled Meds: . amiodarone  200 mg Oral BID  . amLODipine  5 mg Oral BID  . aspirin EC  81 mg Oral Daily  . atorvastatin  80 mg Oral Daily  . cephALEXin  500 mg Oral Q8H  . collagenase   Topical BID  . feeding supplement (ENSURE ENLIVE)  237 mL Oral BID BM  . metoprolol tartrate  25 mg Oral BID  . multivitamin with minerals  1 tablet Oral Daily  . pantoprazole  40 mg Oral BID  . tamsulosin  0.4 mg Oral Daily   Continuous Infusions: . sodium chloride 75 mL/hr at 01/24/18 1359   PRN Meds: acetaminophen **OR** acetaminophen, HYDROcodone-acetaminophen, LORazepam, morphine injection, ondansetron **OR** ondansetron (ZOFRAN) IV, witch hazel-glycerin   Vital Signs    Vitals:   01/24/18 1243 01/24/18 1705 01/24/18 1929 01/25/18 0700  BP: (!) 145/74 (!) 136/92 (!) 142/75 (!) 181/108  Pulse: 67 73 73 (!) 112  Resp:   18 18  Temp:   97.6 F (36.4 C) 98 F (36.7 C)  TempSrc:      SpO2:   98% 100%  Weight:        Intake/Output Summary (Last 24 hours) at 01/25/2018 0923 Last data filed at 01/25/2018 0514 Gross per 24 hour  Intake 750 ml  Output -  Net 750 ml   Filed Weights   01/21/18 0224  Weight: 148 lb (67.1 kg)    Telemetry    Normal sinus rhythm with sinus tachycardia this morning rate more than 120- Personally Reviewed  ECG      Physical Exam   GEN: No acute distress.  Laying on his right side,  sleeping Neck: No JVD Cardiac: RRR, no murmurs, rubs, or gallops.  Respiratory: Clear to auscultation bilaterally. GI: Soft,  non-distended  MS: No edema; No deformity. Neuro: Unable to test Psych: Sleeping Bandage on his back  Labs    Chemistry Recent Labs  Lab 01/20/18 2036  01/21/18 0858 01/21/18 1251 01/23/18 0508 01/24/18 0510  NA 137  --  137  --  140 142  K 3.7  --  4.5 3.8 3.8 4.0  CL 101  --  106  --  109 109  CO2 26  --  26  --  27 29  GLUCOSE 171*  --  133*  --  149* 142*  BUN 40*  --  40*  --  35* 33*  CREATININE 0.95   < > 0.94  --  0.76 0.78  CALCIUM 8.4*  --  8.1*  --  8.2* 8.6*  PROT 6.5  --   --   --   --   --   ALBUMIN 3.0*  --   --   --   --   --   AST 45*  --   --   --   --   --   ALT 36  --   --   --   --   --  ALKPHOS 76  --   --   --   --   --   BILITOT 1.7*  --   --   --   --   --   GFRNONAA >60   < > >60  --  >60 >60  GFRAA >60   < > >60  --  >60 >60  ANIONGAP 10  --  5  --  4* 4*   < > = values in this interval not displayed.     Hematology Recent Labs  Lab 01/21/18 0858 01/22/18 0142 01/24/18 0510  WBC 12.7* 11.1* 10.3  RBC 3.74* 3.37* 3.63*  HGB 11.2* 10.1* 10.9*  HCT 33.7* 30.4* 33.1*  MCV 90.2 90.2 91.1  MCH 30.0 30.1 30.0  MCHC 33.2 33.4 32.9  RDW 13.9 14.1 14.1  PLT 217 214 298    Cardiac Enzymes Recent Labs  Lab 01/20/18 2036 01/21/18 0250 01/21/18 0858 01/21/18 1251  TROPONINI 0.25* 0.21* 0.17* 0.19*   No results for input(s): TROPIPOC in the last 168 hours.   BNPNo results for input(s): BNP, PROBNP in the last 168 hours.   DDimer No results for input(s): DDIMER in the last 168 hours.   Radiology    No results found.  Cardiac Studies   84 y.o. male - Left ventricle: The cavity size was normal. Wall thickness was   normal. Systolic function was normal. The estimated ejection   fraction was in the range of 55% to 65%. - Aortic valve: A bicuspid morphology cannot be excluded; mildly   thickened, mildly  calcified leaflets. Transvalvular velocity was   within the normal range. There was no stenosis. There was no   significant regurgitation. - Right ventricle: The cavity size was normal. Systolic function   was normal. - Pulmonary arteries: Systolic pressure was at least mildly   increased, estimated to be 35 mm Hg plus central venous pressure.  Patient Profile     [name] is an 84 year old man admitted with fall and prolonged downtime complicated by large sacral/back decubitus ulcer, rhabdomyolysis, and paroxysmal atrial fibrillation.     Assessment & Plan     1.  PAF with RVR: Converted to normal sinus rhythm this hospital course Has been in normal sinus rhythm for more than 24 hours Sinus tachycardia this morning in the setting of what sounds like rigors, also associated with severe hypertension during that period,  Feeling flushed, hot  2.  Rhabdomyolysis: -Found down on the floor by family down for unknown amount of time,  -Noted to have abrasions/ulcerations to his coccyx and left upper back  laundry detergent burned his back   3.  Elevated troponin/CAD: -Troponin mildly elevated  Not a good candidate for ischemia workup given severe comorbid conditions including dementia and advanced age  -Continue medical therapy  4. Rigors?  Shaking, tachycardia, hypertension Episode this morning 8 AM Sounds like it resolved without intervention  Consider blood cultures High risk of infection given wound on his back  Would consider palliative consult as he is now not eating well, may not make a meaningful recovery  Long discussion with family and social work  Total encounter time more than 35 minutes  Greater than 50% was spent in counseling and coordination of care with the patient   For questions or updates, please contact [place] Please consult [email or web address] for contact info under Cardiology/STEMI.      Signed, [name] Rogue, MD  01/25/2018, 9:23 AM  \

## 2018-01-25 NOTE — Progress Notes (Signed)
Applied Betadine to L heel at this time, as ordered, and left OTA. Sock re-applied. Foot placed into Prevalon boot. Patient tolerated well. Will continue to monitor wound statuses. Wenda Low [facility]

## 2018-01-25 NOTE — Progress Notes (Signed)
Changed patient's brief and entire bedding at this time with the assistance of the NT. When rolling patient he complains of back pain, then urinates continuously own his own leg and bedding and mattress for about 15 seconds. Replaced brief. NT takes a new set of vital signs. Patient's son completely unappreciative of any this assistance from either myself or the NT. Forcibly tells me at one point to close the door. Wenda Low [facility]

## 2018-01-25 NOTE — Plan of Care (Signed)
  Problem: Education: Goal: Knowledge of General Education information will improve Outcome: Not Progressing Note:  Patient far too fatigued and minimally responsive today to attempt to achieve this goal. Will continue to monitor neurological status. Wenda Low [facility]

## 2018-01-25 NOTE — Progress Notes (Signed)
[place] at [place] NAME: [name]    MR#:  [id number]  DATE OF BIRTH:  July 25, 1931  SUBJECTIVE:  Patient had rapid response called this morning Daughter was worried that patient was not responding. He received Ativan and narcotic last night at approximately 11 PM He does respond to sternal rub and during the rapid response I asked the nurse to insert rectal Tylenol due to temperature of 103 and he responded.  REVIEW OF SYSTEMS:  Unable to obtain due to dementia  Tolerating Diet: yes      DRUG ALLERGIES:   Allergies  Allergen Reactions  . Budesonide-Formoterol Fumarate Swelling    Mouth and tongue swelling.  . Iohexol Shortness Of Breath and Itching    Pt was given 100cc of Omnipaque 300 followed by itching/ dyspnea.  . Simvastatin Other (See Comments)    REACTION: unknown  . Demerol [Meperidine] Other (See Comments)    REACTION: Hallucinations  . Ivp Dye [Iodinated Diagnostic Agents] Other (See Comments)    Iodine pt had a reaction when he had a CT DONE  . Meperidine Hcl Other (See Comments)    REACTION: Hallucinations  . Tape Other (See Comments)    Tears skin off, Please use "paper" tape  . Naproxen Swelling  . Pregabalin Rash  . Sulfonamide Derivatives Rash    VITALS:  Blood pressure (!) 163/67, pulse 88, temperature 100.1 F (37.8 C), resp. rate 18, weight 67.1 kg (148 lb), SpO2 100 %.  PHYSICAL EXAMINATION:  Constitutional: Elderly patient lying in bed   eyes: Conjunctivae and EOM are normal. no scleral icterus.  Neck: Normal ROM. Neck supple. No JVD. No tracheal deviation. CVS: RRR, S1/S2 +, no murmurs, no gallops, no carotid bruit.  Pulmonary: Effort and breath sounds normal, no stridor, rhonchi, wheezes, rales.  Abdominal: Soft. BS +,  no distension, tenderness, rebound or guarding.  Musculoskeletal: Normal range of motion. No edema and no tenderness.  Neuro: Currently lethargic  Skin: Noted to have open  wounds posterior left shoulder  and coccygeal pressure injjury  Psychiatric: dementia     LABORATORY PANEL:   CBC Recent Labs  Lab 01/24/18 0510  WBC 10.3  HGB 10.9*  HCT 33.1*  PLT 298   ------------------------------------------------------------------------------------------------------------------  Chemistries  Recent Labs  Lab 01/20/18 2036  01/21/18 1251  01/24/18 0510  NA 137   < >  --    < > 142  K 3.7   < > 3.8   < > 4.0  CL 101   < >  --    < > 109  CO2 26   < >  --    < > 29  GLUCOSE 171*   < >  --    < > 142*  BUN 40*   < >  --    < > 33*  CREATININE 0.95   < >  --    < > 0.78  CALCIUM 8.4*   < >  --    < > 8.6*  MG  --   --  2.2  --   --   AST 45*  --   --   --   --   ALT 36  --   --   --   --   ALKPHOS 76  --   --   --   --   BILITOT 1.7*  --   --   --   --    < > =  values in this interval not displayed.   ------------------------------------------------------------------------------------------------------------------  Cardiac Enzymes Recent Labs  Lab 01/21/18 0250 01/21/18 0858 01/21/18 1251  TROPONINI 0.21* 0.17* 0.19*   ------------------------------------------------------------------------------------------------------------------  RADIOLOGY:  Dg Chest Port 1 View  Result Date: 01/25/2018 CLINICAL DATA:  Lethargic, minimally responsive.  Fever. EXAM: PORTABLE CHEST 1 VIEW COMPARISON:  01/21/2018 FINDINGS: Bibasilar atelectasis. Heart is borderline in size. Mild vascular congestion. No effusions or acute bony abnormality. IMPRESSION: Borderline heart size, mild vascular congestion. Bibasilar atelectasis. Electronically Signed   By: Rolm Baptise M.D.   On: 01/25/2018 10:36     ASSESSMENT AND PLAN:    84 year old male with history of dementia and CAD who presented with syncope.  1.  Fever with rapid response this morning: Patient has been pancultured Follow lactic acid level I believe that patient's lethargic state is due to pain  medications and Ativan given last night in addition to the fever of 103 as well as underlying dementia and change in sleep/wake cycle.   2.  Syncope: This was due to mechanical fall and weakness. MRI of the brain was negative for acute findings   3.  PAF: Patient is now converted to normal sinus rhythm Continue amiodarone 200 mg and metoprolol Patient is not a candidate for anticoagulation due to fall risk and dementia Patient will follow up with cardiology as an outpatient.  4.  Acute rhabdomyolysis: Patient was found down on the floor by family for an unknown amount of time  5.  Elevated troponin: This is due to demand ischemia.  Patient ruled out for ACS.  6 .Contact dermatitis and tissue damage/loss due to prolonged exposure with detergent/soap  Continue management as per recommended by wound care      Management plans discussed with the patient's family and they are is in agreement.  CODE STATUS: DNR  TOTAL TIME TAKING CARE OF THIS PATIENT: 30 minutes.     POSSIBLE D/C today, DEPENDING ON PT consult Rayna Brenner M.D on 01/25/2018 at 12:37 PM  Between 7am to 6pm - Pager - [phone] After 6pm go to [email or web address] - password [email or web address] [place] [facility]  Office  [phone]  CC: Primary care physician; [name], FNP  Note: This dictation was prepared with Dragon dictation along with smaller phrase technology. Any transcriptional errors that result from this process are unintentional.

## 2018-01-25 NOTE — Progress Notes (Signed)
PT Cancellation Note  Patient Details Name: [name] MRN: [id number] DOB: 12-06-1930   Cancelled Treatment:    Reason Eval/Treat Not Completed: Medical issues which prohibited therapy. Chart reviewed. Pt currently febrile this AM. Minimally responsive with rapid response called. Not currently appropriate for PT treatment. Will attempt PT treatment on later date as pt is appropriate.   Lyndel Safe Huprich PT, DPT   Huprich,Jason 01/25/2018, 3:49 PM

## 2018-01-25 NOTE — Clinical Social Work Note (Signed)
CSW spoke to [facility], and they can accept patient today.  CSW to facilitate discharge planning.  Jones Broom. [place], MSW, [place]  01/25/2018 8:30 AM

## 2018-01-25 NOTE — Progress Notes (Signed)
Per Dr. Benjie Karvonen, patient's wound dressings don't need changed for day shift today. Apparently patient becomes agitated when wound dressings are changed, and has been effectively medicated with IV Ativan at the time. However, it appears to cause too much malaise and is not worth the after effects if wound dressings remain clean, dry, and intact throughout the day. Will continue to monitor status, if remain adequate will not change today. Will inform night shift RN. Family remain at bedside. Wenda Low [facility]

## 2018-01-25 NOTE — Progress Notes (Signed)
Maintaining sinus rhythm with heart rates in the 60s to 70s bpm. No new recommendations. See office note from 4/10.

## 2018-01-25 NOTE — Progress Notes (Signed)
Family is at bedside concerned about patient's temperature level. At PM vitals it was noted that patient's temperature was 99. Family wanted to know about treating the temperature. Explained to family member that patient received Norco at 1830, which contains tylenol. An hour later family members notify staff that patient is shivering and requested another blanket. Staff explained to family member about raising temperature when more linen is added. Rechecked patient's temperature is was 98. Family member states his temperature is usually around 96 or 97. Reiterated to family member that we do not want to raise his temperature.

## 2018-01-25 NOTE — Clinical Social Work Note (Signed)
Clinical Social Work Assessment  Patient Details  Name: [name] MRN: [id number] Date of Birth: April 16, 1931  Date of referral:  01/24/18               Reason for consult:  Facility Placement                Permission sought to share information with:  Facility Sport and exercise psychologist, Family Supports Permission granted to share information::  Yes, Verbal Permission Granted  Name::     Maryan Rued Daughter [phone] or [place], [place]   [phone]   Agency::  SNF admissions  Relationship::     Contact Information:     Housing/Transportation Living arrangements for the past 2 months:  Single Family Home Source of Information:  Adult Children Patient Interpreter Needed:  None Criminal Activity/Legal Involvement Pertinent to Current Situation/Hospitalization:  No - Comment as needed Significant Relationships:  Adult Children Lives with:  Self Do you feel safe going back to the place where you live?  No Need for family participation in patient care:  Yes (Comment)  Care giving concerns:  Patient's family feel he needs some short term rehab before he is able to return back home.   Social Worker assessment / plan:  Patient is an 84 year old male who is alert and oriented x1.  Due to patient's dementia, assessment was completed by speaking with patient's son who was at bedside.  Patient lives alone, however each of his kids live within a mile of where he lives.  The son reports that patient is usually quite independent with activities in his home, and someone checks on him every day.  Patient's son expressed that patient has not been to rehab before, CSW explained to him what the process is and what to expect at SNF.  CSW also explained how insurance will pay for stay at SNF.  Patient's son was given different options of SNFs.  Patient's daughter has agreed to move in with patient once he is able to return back home if he can.  CSW explained how the social  worker at SNF can assist with discharge planning options after patient is ready for dischargel.  Patient's son did not express any other questions and gave CSW permission to begin bed search in [place].  Employment status:  Retired Forensic scientist:  Medicare PT Recommendations:  [place] / Referral to community resources:  [place]  Patient/Family's Response to care: Patient and family are in agreement to having patient go to SNF for short term rehab.  Patient/Family's Understanding of and Emotional Response to Diagnosis, Current Treatment, and Prognosis:  Patient's family is hopeful that patient will not have to be in SNF for very long.  Patient's family hope he will make progress in order to return back home with home health.  Emotional Assessment Appearance:  Appears stated age Attitude/Demeanor/Rapport:    Affect (typically observed):  Appropriate, Calm Orientation:  Oriented to Self Alcohol / Substance use:  Not Applicable Psych involvement (Current and /or in the community):  No (Comment)  Discharge Needs  Concerns to be addressed:  Lack of Support Readmission within the last 30 days:  No Current discharge risk:  Lack of support system, Cognitively Impaired Barriers to Discharge:  Continued Medical Work up   Anell Barr 01/25/2018, 4:21 PM

## 2018-01-26 LAB — BLOOD CULTURE ID PANEL (REFLEXED)
Acinetobacter baumannii: NOT DETECTED
CANDIDA ALBICANS: NOT DETECTED
CANDIDA PARAPSILOSIS: NOT DETECTED
CARBAPENEM RESISTANCE: NOT DETECTED
Candida glabrata: NOT DETECTED
Candida krusei: NOT DETECTED
Candida tropicalis: NOT DETECTED
ENTEROBACTERIACEAE SPECIES: NOT DETECTED
Enterobacter cloacae complex: NOT DETECTED
Enterococcus species: NOT DETECTED
Escherichia coli: NOT DETECTED
Haemophilus influenzae: NOT DETECTED
KLEBSIELLA OXYTOCA: NOT DETECTED
KLEBSIELLA PNEUMONIAE: NOT DETECTED
LISTERIA MONOCYTOGENES: NOT DETECTED
Neisseria meningitidis: NOT DETECTED
PSEUDOMONAS AERUGINOSA: DETECTED — AB
Proteus species: NOT DETECTED
STAPHYLOCOCCUS AUREUS BCID: NOT DETECTED
STREPTOCOCCUS PNEUMONIAE: NOT DETECTED
STREPTOCOCCUS PYOGENES: NOT DETECTED
Serratia marcescens: NOT DETECTED
Staphylococcus species: NOT DETECTED
Streptococcus agalactiae: NOT DETECTED
Streptococcus species: NOT DETECTED

## 2018-01-26 LAB — AEROBIC CULTURE  (SUPERFICIAL SPECIMEN)

## 2018-01-26 LAB — AEROBIC CULTURE W GRAM STAIN (SUPERFICIAL SPECIMEN)

## 2018-01-26 LAB — BASIC METABOLIC PANEL
Anion gap: 5 (ref 5–15)
BUN: 18 mg/dL (ref 6–20)
CHLORIDE: 105 mmol/L (ref 101–111)
CO2: 27 mmol/L (ref 22–32)
CREATININE: 0.8 mg/dL (ref 0.61–1.24)
Calcium: 8 mg/dL — ABNORMAL LOW (ref 8.9–10.3)
GFR calc non Af Amer: >60 mL/min (ref >60)
Glucose, Bld: 121 mg/dL — ABNORMAL HIGH (ref 65–99)
Potassium: 3.9 mmol/L (ref 3.5–5.1)
Sodium: 137 mmol/L (ref 135–145)

## 2018-01-26 LAB — CBC
HEMATOCRIT: 29.2 % — AB (ref 40.0–52.0)
HEMOGLOBIN: 9.9 g/dL — AB (ref 13.0–18.0)
MCH: 30.4 pg (ref 26.0–34.0)
MCHC: 33.8 g/dL (ref 32.0–36.0)
MCV: 89.8 fL (ref 80.0–100.0)
Platelets: 255 10*3/uL (ref 150–440)
RBC: 3.25 MIL/uL — AB (ref 4.40–5.90)
RDW: 13.9 % (ref 11.5–14.5)
WBC: 13.4 10*3/uL — ABNORMAL HIGH (ref 3.8–10.6)

## 2018-01-26 MED ORDER — SODIUM CHLORIDE 0.9 % IV SOLN
2.0000 g | Freq: Two times a day (BID) | INTRAVENOUS | Status: DC
  Administered 2018-01-26: 2 g via INTRAVENOUS
  Filled 2018-01-26 (×4): qty 2

## 2018-01-26 MED ORDER — OCUVITE-LUTEIN PO CAPS
1.0000 | ORAL_CAPSULE | Freq: Every day | ORAL | Status: DC
  Administered 2018-01-26: 1 via ORAL
  Filled 2018-01-26: qty 1

## 2018-01-26 MED ORDER — CIPROFLOXACIN HCL 250 MG PO TABS: 250.0000 mg | ORAL_TABLET | Freq: Two times a day (BID) | ORAL | 0 refills | Status: AC

## 2018-01-26 NOTE — Plan of Care (Signed)
Patient is lethargic, unable to follow command to take medication. Daughter is trying to give patient water through a straw, continue to educate about aspiration precautions. Patient does not appear to be in any distress. Oral intake is decreased. Promote oral care.

## 2018-01-26 NOTE — Progress Notes (Signed)
Patient was discharged to nursing skilled facility. The patient was escorted by EMS. No complaints of pain. Family is at the bedside.

## 2018-01-26 NOTE — Clinical Social Work Placement (Signed)
   CLINICAL SOCIAL WORK PLACEMENT  NOTE  Date:  01/26/2018  Patient Details  Name: [name] MRN: [id number] Date of Birth: Aug 07, 1931  Clinical Social Work is seeking post-discharge placement for this patient at the [place] level of care (*CSW will initial, date and re-position this form in  chart as items are completed):  Yes   Patient/family provided with [facility] Work Department's list of facilities offering this level of care within the geographic area requested by the patient (or if unable, by the patient's family).  Yes   Patient/family informed of their freedom to choose among providers that offer the needed level of care, that participate in Medicare, Medicaid or managed care program needed by the patient, have an available bed and are willing to accept the patient.  Yes   Patient/family informed of 's ownership interest in [facility] and [facility], as well as of the fact that they are under no obligation to receive care at these facilities.  PASRR submitted to EDS on 01/24/18     PASRR number received on 01/24/18     Existing PASRR number confirmed on       FL2 transmitted to all facilities in geographic area requested by pt/family on       FL2 transmitted to all facilities within larger geographic area on       Patient informed that his/her managed care company has contracts with or will negotiate with certain facilities, including the following:        Yes   Patient/family informed of bed offers received.  Patient chooses bed at [facility]     Physician recommends and patient chooses bed at [facility]    Patient to be transferred to [facility] on 01/26/18.  Patient to be transferred to facility by EMS      Patient family notified on 01/26/18 of transfer.  Name of family member notified:  Daughter Maryan Rued      PHYSICIAN       Additional  Comment:    _______________________________________________ Annamaria Boots, [place] 01/26/2018, 4:39 PM

## 2018-01-26 NOTE — Discharge Summary (Signed)
[facility] [place] NAME: [name]    MR#:  [id number]  DATE OF BIRTH:  [birth year]  DATE OF ADMISSION:  01/20/2018 ADMITTING PHYSICIAN: Lance Coon, MD  DATE OF DISCHARGE: 01/26/2018  PRIMARY CARE PHYSICIAN: [name], FNP    ADMISSION DIAGNOSIS:  Cough [R05] Elevated troponin [R74.8] Skin breakdown [L90.9] Fall, initial encounter [W19.XXXA]  DISCHARGE DIAGNOSIS:  Principal Problem:   Unresponsive episode Active Problems:   Hyperlipidemia   CAD (coronary artery disease)   GERD   Essential hypertension   Elevated troponin   Leukocytosis   Atrial fibrillation (HCC)   Malnutrition of moderate degree   Pressure injury of skin   Bacteremia- pseudomonas  SECONDARY DIAGNOSIS:   Past Medical History:  Diagnosis Date  . Arthritis    "hips, back" (06/17/2015)  . Asthma   . Chronic chest pain   . Chronic lower back pain   . Coronary artery disease    a. s/p BMS to RCA in 2002; b. s/p cutting balloon POBA ;   c. cath 6/12: oDx 80% (treated with repeat cutting balloon POBA), mLAD 50% with 30-40% at Dx, CFX 30%, pRCA 25% with patent stents;  d.  Lex MV 4/14:  Low Risk - EF 61%, inf scar with peri-infarct ischemia  . Dyspnea    chronic  . GERD (gastroesophageal reflux disease)    h/o esophageal spasm  . Headache   . History of blood transfusion    "related to OR"  . History of hiatal hernia   . Hyperlipidemia   . Hypertension   . Melanoma of lower back ([place]) late 1990's  . Memory loss   . Myocardial infarction ([place]) 2001   x 1, confirned 1 possible    HOSPITAL COURSE:   84 year old male with history of dementia and CAD who presented with syncope.  1.  Syncope: This was due to mechanical fall and weakness. MRI of the brain was negative for acute findings   2.  PAF: Patient is now converted to normal sinus rhythm Continue amiodarone 200 mg and metoprolol Patient is not a candidate for anticoagulation due to  fall risk and dementia Patient will follow up with cardiology as an outpatient.  3.  Acute rhabdomyolysis: Patient was found down on the floor by family for an unknown amount of time  4.  Elevated troponin: This is due to demand ischemia.  Patient ruled out for ACS.  5.Contact dermatitis and tissue damage/loss due to prolonged exposure with detergent/soap  Continue management as per recommended by wound care  Dressing procedure/placement/frequency: Patient is on a low bed for fall precautions.  He can be turned and repositioned from side to side.  Bilateral Heel boots are provided for treatment of left heel and for right heel pressure injury prevention.  Topical wound care to IAD is in place, patent is being cleansed following incontinence episodes with house skin cleansing and moisturizing cloths.that leave a moisture barrier residue. Topical care for the chemical injury to the back and shoulders will be twice daily with NS cleanse and placement of xeroform wound contact layer dressing (antimicrobial and astringent properties) with extra large ABDs to cover and Kerlix to secure. The coccygeal area Unstageable pressure injury will be treated with an enzymatic debriding agent (collagenase (Santyl) topped with NS dressings and changed twice daily. Once assured that patient is a low fall risk, a mattress replacement with low air loss feature would be considered as an additional treatment modality.  As patient was admitted s/p fall, we much instead select the low bed option with frequent turning and repositioning as the support intervention.   6.  BPH: Continue Cardura and Flomax  7. Bacteremia- with pseudomonas \  Seen by ID suggest total 10 days of therapy- giving cipro for 8 more days.   DISCHARGE CONDITIONS AND DIET:   Stable condition on dysphagia 3 diet   CONSULTS OBTAINED:  Treatment Team:  Sherren Mocha, MD Leonel Ramsay, MD  DRUG ALLERGIES:   Allergies  Allergen  Reactions  . Budesonide-Formoterol Fumarate Swelling    Mouth and tongue swelling.  . Iohexol Shortness Of Breath and Itching    Pt was given 100cc of Omnipaque 300 followed by itching/ dyspnea.  . Simvastatin Other (See Comments)    REACTION: unknown  . Demerol [Meperidine] Other (See Comments)    REACTION: Hallucinations  . Ivp Dye [Iodinated Diagnostic Agents] Other (See Comments)    Iodine pt had a reaction when he had a CT DONE  . Meperidine Hcl Other (See Comments)    REACTION: Hallucinations  . Tape Other (See Comments)    Tears skin off, Please use "paper" tape  . Naproxen Swelling  . Pregabalin Rash  . Sulfonamide Derivatives Rash    DISCHARGE MEDICATIONS:   Allergies as of 01/26/2018      Reactions   Budesonide-formoterol Fumarate Swelling   Mouth and tongue swelling.   Iohexol Shortness Of Breath, Itching   Pt was given 100cc of Omnipaque 300 followed by itching/ dyspnea.   Simvastatin Other (See Comments)   REACTION: unknown   Demerol [meperidine] Other (See Comments)   REACTION: Hallucinations   Ivp Dye [iodinated Diagnostic Agents] Other (See Comments)   Iodine pt had a reaction when he had a CT DONE   Meperidine Hcl Other (See Comments)   REACTION: Hallucinations   Tape Other (See Comments)   Tears skin off, Please use "paper" tape   Naproxen Swelling   Pregabalin Rash   Sulfonamide Derivatives Rash      Medication List    STOP taking these medications   isosorbide mononitrate 60 MG 24 hr tablet Commonly known as:  IMDUR   metoprolol tartrate 25 MG tablet Commonly known as:  LOPRESSOR   nitroGLYCERIN 0.4 MG SL tablet Commonly known as:  NITROSTAT     TAKE these medications   acetaminophen 325 MG tablet Commonly known as:  TYLENOL Take 2 tablets (650 mg total) by mouth every 4 (four) hours as needed for headache or mild pain.   amiodarone 200 MG tablet Commonly known as:  PACERONE Take 1 tablet (200 mg total) by mouth 2 (two) times daily.    aspirin EC 81 MG tablet Take 1 tablet (81 mg total) by mouth daily.   atorvastatin 80 MG tablet Commonly known as:  LIPITOR Take 1 tablet (80 mg total) by mouth daily.   ciprofloxacin 250 MG tablet Commonly known as:  CIPRO Take 1 tablet (250 mg total) by mouth 2 (two) times daily for 8 days.   collagenase ointment Commonly known as:  SANTYL Apply topically 2 (two) times daily.   cyanocobalamin 1000 MCG tablet Take 1 tablet (1,000 mcg total) by mouth daily.   cyclobenzaprine 5 MG tablet Commonly known as:  FLEXERIL Take 7.5 mg by mouth 3 (three) times daily as needed for muscle spasms.   doxazosin 1 MG tablet Commonly known as:  CARDURA TAKE 1 TABLET BY MOUTH ONCE DAILY.   feeding supplement (ENSURE ENLIVE)  Liqd Take 237 mLs by mouth 2 (two) times daily between meals. What changed:  Another medication with the same name was added. Make sure you understand how and when to take each.   feeding supplement (ENSURE ENLIVE) Liqd Take 237 mLs by mouth 2 (two) times daily between meals. What changed:  You were already taking a medication with the same name, and this prescription was added. Make sure you understand how and when to take each.   HYDROcodone-acetaminophen 7.5-325 MG tablet Commonly known as:  NORCO Take 1 tablet by mouth every 4 (four) hours as needed for moderate pain. FOR PAIN   pantoprazole 40 MG tablet Commonly known as:  PROTONIX TAKE 1 TABLET BY MOUTH TWICE DAILY   tamsulosin 0.4 MG Caps capsule Commonly known as:  FLOMAX Take 0.4 mg by mouth daily.            Discharge Care Instructions  (From admission, onward)        Start     Ordered   01/24/18 0000  Discharge wound care:    Comments:  Topical wound care to IAD is in place, patent is being cleansed following incontinence episodes with house skin cleansing and moisturizing cloths.that leave a moisture barrier residue. Topical care for the chemical injury to the back and shoulders will be twice  daily with NS cleanse and placement of xeroform wound contact layer dressing (antimicrobial and astringent properties) with extra large ABDs to cover and Kerlix to secure. The coccygeal area Unstageable pressure injury will be treated with an enzymatic debriding agent (collagenase (Santyl) topped with NS dressings and changed twice daily.   01/24/18 1054        Today   CHIEF COMPLAINT:   Family at bedside.  Patient is converted to sinus rhythm   VITAL SIGNS:  Blood pressure 117/68, pulse 72, temperature 98 F (36.7 C), temperature source Oral, resp. rate 14, weight 66.7 kg (147 lb), SpO2 100 %.   REVIEW OF SYSTEMS:  Review of Systems  Unable to perform ROS: Dementia     PHYSICAL EXAMINATION:  Constitutional: Elderly patient lying in bed  eyes: Conjunctivae and EOM are normal. PERRLA, no scleral icterus.  Neck: Normal ROM. Neck supple. No JVD. No tracheal deviation. CVS: RRR, S1/S2 +, no murmurs, no gallops, no carotid bruit.  Pulmonary: Effort and breath sounds normal, no stridor, rhonchi, wheezes, rales.  Abdominal: Soft. BS +,  no distension, tenderness, rebound or guarding.  Musculoskeletal: Normal range of motion. No edema and no tenderness.  Neuro: Currently sleeping and coccygeal pressure injjury Skin: Noted to have open wounds posterior left shoulder Psychiatric: dementia     DATA REVIEW:   CBC Recent Labs  Lab 01/26/18 0457  WBC 13.4*  HGB 9.9*  HCT 29.2*  PLT 255    Chemistries  Recent Labs  Lab 01/20/18 2036  01/21/18 1251  01/26/18 0457  NA 137   < >  --    < > 137  K 3.7   < > 3.8   < > 3.9  CL 101   < >  --    < > 105  CO2 26   < >  --    < > 27  GLUCOSE 171*   < >  --    < > 121*  BUN 40*   < >  --    < > 18  CREATININE 0.95   < >  --    < > 0.80  CALCIUM 8.4*   < >  --    < >  8.0*  MG  --   --  2.2  --   --   AST 45*  --   --   --   --   ALT 36  --   --   --   --   ALKPHOS 76  --   --   --   --   BILITOT 1.7*  --   --   --   --     < > = values in this interval not displayed.    Cardiac Enzymes Recent Labs  Lab 01/21/18 0250 01/21/18 0858 01/21/18 1251  TROPONINI 0.21* 0.17* 0.19*    Microbiology Results  @MICRORSLT48 @  RADIOLOGY:  Dg Chest Port 1 View  Result Date: 01/25/2018 CLINICAL DATA:  Lethargic, minimally responsive.  Fever. EXAM: PORTABLE CHEST 1 VIEW COMPARISON:  01/21/2018 FINDINGS: Bibasilar atelectasis. Heart is borderline in size. Mild vascular congestion. No effusions or acute bony abnormality. IMPRESSION: Borderline heart size, mild vascular congestion. Bibasilar atelectasis. Electronically Signed   By: Rolm Baptise M.D.   On: 01/25/2018 10:36      Allergies as of 01/26/2018      Reactions   Budesonide-formoterol Fumarate Swelling   Mouth and tongue swelling.   Iohexol Shortness Of Breath, Itching   Pt was given 100cc of Omnipaque 300 followed by itching/ dyspnea.   Simvastatin Other (See Comments)   REACTION: unknown   Demerol [meperidine] Other (See Comments)   REACTION: Hallucinations   Ivp Dye [iodinated Diagnostic Agents] Other (See Comments)   Iodine pt had a reaction when he had a CT DONE   Meperidine Hcl Other (See Comments)   REACTION: Hallucinations   Tape Other (See Comments)   Tears skin off, Please use "paper" tape   Naproxen Swelling   Pregabalin Rash   Sulfonamide Derivatives Rash      Medication List    STOP taking these medications   isosorbide mononitrate 60 MG 24 hr tablet Commonly known as:  IMDUR   metoprolol tartrate 25 MG tablet Commonly known as:  LOPRESSOR   nitroGLYCERIN 0.4 MG SL tablet Commonly known as:  NITROSTAT     TAKE these medications   acetaminophen 325 MG tablet Commonly known as:  TYLENOL Take 2 tablets (650 mg total) by mouth every 4 (four) hours as needed for headache or mild pain.   amiodarone 200 MG tablet Commonly known as:  PACERONE Take 1 tablet (200 mg total) by mouth 2 (two) times daily.   aspirin EC 81 MG  tablet Take 1 tablet (81 mg total) by mouth daily.   atorvastatin 80 MG tablet Commonly known as:  LIPITOR Take 1 tablet (80 mg total) by mouth daily.   ciprofloxacin 250 MG tablet Commonly known as:  CIPRO Take 1 tablet (250 mg total) by mouth 2 (two) times daily for 8 days.   collagenase ointment Commonly known as:  SANTYL Apply topically 2 (two) times daily.   cyanocobalamin 1000 MCG tablet Take 1 tablet (1,000 mcg total) by mouth daily.   cyclobenzaprine 5 MG tablet Commonly known as:  FLEXERIL Take 7.5 mg by mouth 3 (three) times daily as needed for muscle spasms.   doxazosin 1 MG tablet Commonly known as:  CARDURA TAKE 1 TABLET BY MOUTH ONCE DAILY.   feeding supplement (ENSURE ENLIVE) Liqd Take 237 mLs by mouth 2 (two) times daily between meals. What changed:  Another medication with the same name was added. Make sure you understand how and when to take each.  feeding supplement (ENSURE ENLIVE) Liqd Take 237 mLs by mouth 2 (two) times daily between meals. What changed:  You were already taking a medication with the same name, and this prescription was added. Make sure you understand how and when to take each.   HYDROcodone-acetaminophen 7.5-325 MG tablet Commonly known as:  NORCO Take 1 tablet by mouth every 4 (four) hours as needed for moderate pain. FOR PAIN   pantoprazole 40 MG tablet Commonly known as:  PROTONIX TAKE 1 TABLET BY MOUTH TWICE DAILY   tamsulosin 0.4 MG Caps capsule Commonly known as:  FLOMAX Take 0.4 mg by mouth daily.            Discharge Care Instructions  (From admission, onward)        Start     Ordered   01/24/18 0000  Discharge wound care:    Comments:  Topical wound care to IAD is in place, patent is being cleansed following incontinence episodes with house skin cleansing and moisturizing cloths.that leave a moisture barrier residue. Topical care for the chemical injury to the back and shoulders will be twice daily with NS  cleanse and placement of xeroform wound contact layer dressing (antimicrobial and astringent properties) with extra large ABDs to cover and Kerlix to secure. The coccygeal area Unstageable pressure injury will be treated with an enzymatic debriding agent (collagenase (Santyl) topped with NS dressings and changed twice daily.   01/24/18 1054         Management plans discussed with the patient's family and they are in agreement. Stable for discharge   Patient should follow up with pcp  CODE STATUS:     Code Status Orders  (From admission, onward)        Start     Ordered   01/21/18 0228  Do not attempt resuscitation (DNR)  Continuous    Question Answer Comment  In the event of cardiac or respiratory ARREST Do not call a "code blue"   In the event of cardiac or respiratory ARREST Do not perform Intubation, CPR, defibrillation or ACLS   In the event of cardiac or respiratory ARREST Use medication by any route, position, wound care, and other measures to relive pain and suffering. May use oxygen, suction and manual treatment of airway obstruction as needed for comfort.      01/21/18 0227    Code Status History    Date Active Date Inactive Code Status Order ID Comments User Context   07/31/2017 2140 08/02/2017 1405 DNR [id number]  Harrie Foreman, MD Inpatient   06/19/2017 0723 06/20/2017 2230 DNR [id number]  Rise Patience, MD Inpatient   06/18/2017 2357 06/19/2017 0723 Full Code [id number]  Rise Patience, MD ED   11/14/2015 1651 11/15/2015 1921 Full Code [id number]  Dorothy Spark, MD Inpatient   06/17/2015 1727 06/18/2015 2103 Full Code [id number]  Francesca Oman, DO Inpatient   03/03/2014 0212 03/05/2014 1758 Full Code [id number]  Bynum Bellows, MD Inpatient   01/13/2014 1324 01/13/2014 1843 Full Code [id number]  Sherren Mocha, MD Inpatient   12/14/2013 1627 12/16/2013 2055 Full Code [id number]  Branch, Alphonse Guild, MD ED    Advance Directive Documentation     Most Recent Value   Type of Advance Directive  Out of facility DNR (pink MOST or yellow form)  Pre-existing out of facility DNR order (yellow form or pink MOST form)  Physician notified to receive inpatient order  "MOST" Form in Place?  -  TOTAL TIME TAKING CARE OF THIS PATIENT: 38 minutes.    Note: This dictation was prepared with Dragon dictation along with smaller phrase technology. Any transcriptional errors that result from this process are unintentional.  Vaughan Basta M.D on 01/26/2018 at 3:53 PM  Between 7am to 6pm - Pager - [phone] After 6pm go to [email or web address] - password [email or web address] [facility]  Office  [phone]  CC: Primary care physician; [name], FNP

## 2018-01-26 NOTE — Progress Notes (Signed)
PHARMACY - PHYSICIAN COMMUNICATION CRITICAL VALUE ALERT - BLOOD CULTURE IDENTIFICATION (BCID)  [name] is an 84 y.o. male who presented to [facility] on 01/20/2018 with a chief complaint of fall/unresponsive  Assessment:  Pressure sore on shoulder, WCX GNR, WBC 13.4, now 2/4 GNR BCID pseudomonas [facility] -  Name of physician (or Provider) Contacted: Arta Silence  Current antibiotics: Cephalexin  Changes to prescribed antibiotics recommended:  Recommendations accepted by provider -- switching to Cefepime 2g IV q12h per CrCl > 60 ml/min   Results for orders placed or performed during the hospital encounter of 01/20/18  Blood Culture ID Panel (Reflexed) (Collected: 01/25/2018 10:35 AM)  Result Value Ref Range   Enterococcus species NOT DETECTED NOT DETECTED   Listeria monocytogenes NOT DETECTED NOT DETECTED   Staphylococcus species NOT DETECTED NOT DETECTED   Staphylococcus aureus NOT DETECTED NOT DETECTED   Streptococcus species NOT DETECTED NOT DETECTED   Streptococcus agalactiae NOT DETECTED NOT DETECTED   Streptococcus pneumoniae NOT DETECTED NOT DETECTED   Streptococcus pyogenes NOT DETECTED NOT DETECTED   Acinetobacter baumannii NOT DETECTED NOT DETECTED   Enterobacteriaceae species NOT DETECTED NOT DETECTED   Enterobacter cloacae complex NOT DETECTED NOT DETECTED   Escherichia coli NOT DETECTED NOT DETECTED   Klebsiella oxytoca NOT DETECTED NOT DETECTED   Klebsiella pneumoniae NOT DETECTED NOT DETECTED   Proteus species NOT DETECTED NOT DETECTED   Serratia marcescens NOT DETECTED NOT DETECTED   Carbapenem resistance NOT DETECTED NOT DETECTED   Haemophilus influenzae NOT DETECTED NOT DETECTED   Neisseria meningitidis NOT DETECTED NOT DETECTED   Pseudomonas aeruginosa DETECTED (A) NOT DETECTED   Candida albicans NOT DETECTED NOT DETECTED   Candida glabrata NOT DETECTED NOT DETECTED   Candida krusei NOT DETECTED NOT DETECTED   Candida parapsilosis NOT DETECTED NOT  DETECTED   Candida tropicalis NOT DETECTED NOT DETECTED   Tobie Lords, PharmD, BCPS Clinical Pharmacist 01/26/2018

## 2018-01-26 NOTE — Progress Notes (Signed)
IV and tele removed from patient. Report called to [facility], EMS called for transportation.

## 2018-01-26 NOTE — Progress Notes (Signed)
Nutrition Follow Up Note   DOCUMENTATION CODES:   Non-severe (moderate) malnutrition in context of social or environmental circumstances  INTERVENTION:   Pt likely at high refeeding risk; recommend monitor K, Mg, and P labs.   Continue Ensure Enlive po BID for when diet able to be advanced, each supplement provides 350 kcal and 20 grams of protein.  Continue MVI daily   Add Ocuvite daily for wound healing (provides zinc, vitamin A, vitamin C, Vitamin E, copper, and selenium)  Dysphagia 3 diet   Magic cup TID with meals, each supplement provides 290 kcal and 9 grams of protein  NUTRITION DIAGNOSIS:   Moderate Malnutrition related to social / environmental circumstances(decreased intake due to inability to cook at home, inadequate protein intake) as evidenced by moderate fat depletion, mild muscle depletion, moderate muscle depletion.  GOAL:   Patient will meet greater than or equal to 90% of their needs  -progressing   MONITOR:   PO intake, Supplement acceptance, Labs, Weight trends, I & O's  ASSESSMENT:   84 year old male with PMHx of memory loss, HTN, HLD, GERD, asthma, CAD, hx MI 2001, hx hiatal hernia now admitted after unwitnessed fall, also with new onset A-fib with RVR.   Spoke with RN. Pts appetite and oral intake improving. Pt drinking Ensure and eating some ice cream today. Family at bedside and assists with pt's meals. Pt eating only a few bites of regular meals. RD will add Magic Cups and Ocuvite for wound healing. Per chart, pt appears weight stable since admit; however, only two weights have been documented. Pt likely at refeeding risk; recommend monitor K, Mg, and P labs.   Medications reviewed and include: aspirin, lovenox, MVI, protonix, NaCl @75ml /hr, cefepime  Labs reviewed: wbc- 13.4(H), Hgb 9.9(L), Hct 29.2(L)  Diet Order:  DIET DYS 3 Room service appropriate? Yes; Fluid consistency: Thin  EDUCATION NEEDS:   Education needs have been addressed(with  family)  Skin:  DTI: sacrum (8cm x 8cm) Other: possible burn wound to back; MSAD to back  Last BM:  unknown   Height:   Ht Readings from Last 1 Encounters:  12/26/17 5\' 9"  (1.753 m)    Weight:   Wt Readings from Last 1 Encounters:  01/26/18 147 lb (66.7 kg)    Ideal Body Weight:  72.7 kg(calculated from ht of 5\' 9"  from previous encounter)  BMI:  Body mass index is 21.71 kg/m.  Estimated Nutritional Needs:   Kcal:  1630-1900 (MSJ x 1.2-1.4)  Protein:  80-95 grams (1.2-1.4 grams/kg)  Fluid:  1.6 L/day (25 mL/kg)  Koleen Distance MS, RD, LDN Pager #- [phone] After Hours Pager: [phone]

## 2018-01-26 NOTE — Clinical Social Work Note (Signed)
CSW informed by physician that patient is ready for discharge. Discharge information sent to [facility]. Patient's family is aware and doing paperwork at this moment with Hilda Blades at [facility]. Patient to transport via EMS. Patient's nurse is aware. Shela Leff MSW,LCSW [phone]

## 2018-01-26 NOTE — Progress Notes (Addendum)
[facility] PROGRESS NOTE Date of Admission:  01/20/2018     ID: [name] is a 84 y.o. male with back wound and pseudomonal bacteremia Principal Problem:   Unresponsive episode Active Problems:   Hyperlipidemia   CAD (coronary artery disease)   GERD   Essential hypertension   Elevated troponin   Leukocytosis   Atrial fibrillation (HCC)   Malnutrition of moderate degree   Pressure injury of skin   Subjective: Had a high spiking fever but improving. bcx +  ROS  Unable to obtain  Medications:  Antibiotics Given (last 72 hours)    Date/Time Action Medication Dose Rate   01/23/18 1834 Given   cephALEXin (KEFLEX) capsule 500 mg 500 mg    01/23/18 2120 Given   cephALEXin (KEFLEX) capsule 500 mg 500 mg    01/24/18 0511 Given   cephALEXin (KEFLEX) capsule 500 mg 500 mg    01/24/18 1358 Given   cephALEXin (KEFLEX) capsule 500 mg 500 mg    01/24/18 2211 Given   cephALEXin (KEFLEX) capsule 500 mg 500 mg    01/25/18 5053 Given   cephALEXin (KEFLEX) capsule 500 mg 500 mg    01/25/18 1308 Given   cephALEXin (KEFLEX) capsule 500 mg 500 mg    01/26/18 0601 Given   cephALEXin (KEFLEX) capsule 500 mg 500 mg    01/26/18 0656 New Bag/Given   ceFEPIme (MAXIPIME) 2 g in sodium chloride 0.9 % 100 mL IVPB 2 g 200 mL/hr     . amiodarone  200 mg Oral BID  . amLODipine  5 mg Oral BID  . aspirin EC  81 mg Oral Daily  . atorvastatin  80 mg Oral Daily  . collagenase   Topical BID  . enoxaparin (LOVENOX) injection  40 mg Subcutaneous Q24H  . feeding supplement (ENSURE ENLIVE)  237 mL Oral BID BM  . metoprolol tartrate  25 mg Oral BID  . multivitamin with minerals  1 tablet Oral Daily  . pantoprazole  40 mg Oral BID  . tamsulosin  0.4 mg Oral Daily    Objective: Vital signs in last 24 hours: Temp:  [97.9 F (36.6 C)-99.4 F (37.4 C)] 98.5 F (36.9 C) (04/12 0750) Pulse Rate:  [75-92] 75 (04/12 0750) Resp:  [14] 14 (04/11 1721) BP: (126-169)/(76-96)  143/78 (04/12 0750) SpO2:  [97 %-98 %] 98 % (04/12 0750) Weight:  [66.7 kg (147 lb)] 66.7 kg (147 lb) (04/12 0329) Constitutional: much more awake, sitting up in bed HENT: anicteirc Mouth/Throat: Oropharynx is clear and moist.  Cardiovascular: Normal rate, regular rhythm and normal heart sounds. Pulmonary/Chest: Effort normal and breath sounds normal. Abdominal: Soft. Bowel sounds are normal. He exhibits no distension. There is no tenderness.  Lymphadenopathy: He has no cervical adenopathy.  Neurological - awake and interactive Skin: L shoulder and back with extensive shallow superficial wound but no necrotic tissue . Mod ttp   Lab Results Recent Labs    01/24/18 0510 01/26/18 0457  WBC 10.3 13.4*  HGB 10.9* 9.9*  HCT 33.1* 29.2*  NA 142 137  K 4.0 3.9  CL 109 105  CO2 29 27  BUN 33* 18  CREATININE 0.78 0.80    Microbiology: Results for orders placed or performed during the hospital encounter of 01/20/18  Urine Culture     Status: None   Collection Time: 01/21/18  1:43 AM  Result Value Ref Range Status   Specimen Description   Final    URINE, RANDOM Performed at  [facility], [address], [place], [place] [address]    Special Requests   Final    NONE Performed at [facility], [address], [place], [place] [address]    Culture   Final    NO GROWTH Performed at [facility], [place] [address], [place], [place] [address]    Report Status 01/22/2018 FINAL  Final  Aerobic Culture (superficial specimen)     Status: None   Collection Time: 01/23/18  3:57 PM  Result Value Ref Range Status   Specimen Description   Final    SHOULDER Performed at [facility], [address], [name], [place] [address]    Special Requests   Final    NONE Performed at [facility], [name] [place], [place] [address]    Gram Stain   Final    FEW WBC PRESENT,BOTH PMN AND MONONUCLEAR ABUNDANT GRAM NEGATIVE RODS Performed at [facility], [place] [address], [place], [place] [address]    Culture   Final    MODERATE PSEUDOMONAS AERUGINOSA MODERATE ESCHERICHIA COLI    Report Status 01/26/2018 FINAL  Final   Organism ID, Bacteria PSEUDOMONAS AERUGINOSA  Final   Organism ID, Bacteria ESCHERICHIA COLI  Final      Susceptibility   Escherichia coli - MIC*    AMPICILLIN <=2 SENSITIVE Sensitive     CEFAZOLIN <=4 SENSITIVE Sensitive     CEFEPIME <=1 SENSITIVE Sensitive     CEFTAZIDIME <=1 SENSITIVE Sensitive     CEFTRIAXONE <=1 SENSITIVE Sensitive     CIPROFLOXACIN <=0.25 SENSITIVE Sensitive     GENTAMICIN <=1 SENSITIVE Sensitive     IMIPENEM <=0.25 SENSITIVE Sensitive     TRIMETH/SULFA <=20 SENSITIVE Sensitive     AMPICILLIN/SULBACTAM <=2 SENSITIVE Sensitive     PIP/TAZO <=4 SENSITIVE Sensitive     Extended ESBL NEGATIVE Sensitive     * MODERATE ESCHERICHIA COLI   Pseudomonas aeruginosa - MIC*    CEFTAZIDIME 2 SENSITIVE Sensitive     CIPROFLOXACIN <=0.25 SENSITIVE Sensitive     GENTAMICIN <=1 SENSITIVE Sensitive     IMIPENEM <=0.25 SENSITIVE Sensitive     PIP/TAZO <=4 SENSITIVE Sensitive     CEFEPIME <=1 SENSITIVE Sensitive     * MODERATE PSEUDOMONAS AERUGINOSA  CULTURE, BLOOD (ROUTINE X 2) w Reflex to ID Panel     Status: None (Preliminary result)   Collection Time: 01/25/18 10:35 AM  Result Value Ref Range Status   Specimen Description   Final    BLOOD RIGHT ANTECUBITAL Performed at [facility], [address], [place], [place] [address]    Special Requests   Final    BOTTLES DRAWN AEROBIC AND ANAEROBIC Blood Culture adequate volume Performed at [facility], [address] [place],  [address]    Culture  Setup Time   Final    GRAM NEGATIVE RODS AEROBIC BOTTLE ONLY CRITICAL RESULT CALLED TO, READ BACK BY AND VERIFIED WITH: Deone Omahoney BESANTE AT 2841 01/26/18 SDR Performed at [facility], [place] [address], [place],  [address]    Culture GRAM NEGATIVE RODS  Final    Report Status PENDING  Incomplete  Blood Culture ID Panel (Reflexed)     Status: Abnormal   Collection Time: 01/25/18 10:35 AM  Result Value Ref Range Status   Enterococcus species NOT DETECTED NOT DETECTED Final   Listeria monocytogenes NOT DETECTED NOT DETECTED Final   Staphylococcus species NOT DETECTED NOT DETECTED Final  Staphylococcus aureus NOT DETECTED NOT DETECTED Final   Streptococcus species NOT DETECTED NOT DETECTED Final   Streptococcus agalactiae NOT DETECTED NOT DETECTED Final   Streptococcus pneumoniae NOT DETECTED NOT DETECTED Final   Streptococcus pyogenes NOT DETECTED NOT DETECTED Final   Acinetobacter baumannii NOT DETECTED NOT DETECTED Final   Enterobacteriaceae species NOT DETECTED NOT DETECTED Final   Enterobacter cloacae complex NOT DETECTED NOT DETECTED Final   Escherichia coli NOT DETECTED NOT DETECTED Final   Klebsiella oxytoca NOT DETECTED NOT DETECTED Final   Klebsiella pneumoniae NOT DETECTED NOT DETECTED Final   Proteus species NOT DETECTED NOT DETECTED Final   Serratia marcescens NOT DETECTED NOT DETECTED Final   Carbapenem resistance NOT DETECTED NOT DETECTED Final   Haemophilus influenzae NOT DETECTED NOT DETECTED Final   Neisseria meningitidis NOT DETECTED NOT DETECTED Final   Pseudomonas aeruginosa DETECTED (A) NOT DETECTED Final    Comment: CRITICAL RESULT CALLED TO, READ BACK BY AND VERIFIED WITH: Kyrstal Monterrosa BESANTE AT 0626 01/26/18 SDR    Candida albicans NOT DETECTED NOT DETECTED Final   Candida glabrata NOT DETECTED NOT DETECTED Final   Candida krusei NOT DETECTED NOT DETECTED Final   Candida parapsilosis NOT DETECTED NOT DETECTED Final   Candida tropicalis NOT DETECTED NOT DETECTED Final    Comment: Performed at [facility], [address] [place], [place] [address]  CULTURE, BLOOD (ROUTINE X 2) w Reflex to ID Panel     Status: None (Preliminary result)   Collection Time: 01/25/18 10:48 AM  Result Value Ref Range Status   Specimen  Description BLOOD BLOOD LEFT HAND  Final   Special Requests   Final    BOTTLES DRAWN AEROBIC AND ANAEROBIC Blood Culture adequate volume   Culture  Setup Time   Final    GRAM NEGATIVE RODS AEROBIC BOTTLE ONLY CRITICAL VALUE NOTED.  VALUE IS CONSISTENT WITH PREVIOUSLY REPORTED AND CALLED VALUE. Performed at [facility], [address] [place], [place] [address]    Culture GRAM NEGATIVE RODS  Final   Report Status PENDING  Incomplete    Studies/Results: Dg Chest Port 1 View  Result Date: 01/25/2018 CLINICAL DATA:  Lethargic, minimally responsive.  Fever. EXAM: PORTABLE CHEST 1 VIEW COMPARISON:  01/21/2018 FINDINGS: Bibasilar atelectasis. Heart is borderline in size. Mild vascular congestion. No effusions or acute bony abnormality. IMPRESSION: Borderline heart size, mild vascular congestion. Bibasilar atelectasis. Electronically Signed   By: Rolm Baptise M.D.   On: 01/25/2018 10:36    Assessment/Plan: [name] is a 84 y.o. male with pressure ulcer on L shoulder and back after a fall where he lay in detergent for an unknown period of time (up to 3 days possible) Had mild elevated wbc. Wound is inflamed and has necrotic tissue.   4/12- had high fever, bcx + pseudomonas. Wound with same and e coli.  Recommendations Cont cefepime while inpt At dc send on oral cipro for 10 days total. Today Is day 2. Continue wound care per [facility] RN recs Thank you very much for the consult. Will follow with you.  Leonel Ramsay   01/26/2018, 1:39 PM

## 2018-01-28 LAB — CULTURE, BLOOD (ROUTINE X 2)
SPECIAL REQUESTS: ADEQUATE
Special Requests: ADEQUATE

## 2018-02-06 ENCOUNTER — Ambulatory Visit
Admission: RE | Admit: 2018-02-06 | Discharge: 2018-02-06 | Disposition: A | Discharge status: Home / Self Care | Payer: Medicare Other | Source: Ambulatory Visit | Attending: Physician Assistant | Admitting: Physician Assistant

## 2018-02-06 ENCOUNTER — Other Ambulatory Visit: Payer: Self-pay | Admitting: Physician Assistant

## 2018-02-06 ENCOUNTER — Encounter: Payer: Medicare Other | Attending: Physician Assistant | Admitting: Physician Assistant

## 2018-02-06 DIAGNOSIS — R7303 Prediabetes: Secondary | ICD-10-CM | POA: Diagnosis not present

## 2018-02-06 DIAGNOSIS — B999 Unspecified infectious disease: Secondary | ICD-10-CM

## 2018-02-06 DIAGNOSIS — Z91041 Radiographic dye allergy status: Secondary | ICD-10-CM | POA: Insufficient documentation

## 2018-02-06 DIAGNOSIS — W19XXXA Unspecified fall, initial encounter: Secondary | ICD-10-CM | POA: Diagnosis not present

## 2018-02-06 DIAGNOSIS — L98419 Non-pressure chronic ulcer of buttock with unspecified severity: Secondary | ICD-10-CM | POA: Insufficient documentation

## 2018-02-06 DIAGNOSIS — R21 Rash and other nonspecific skin eruption: Secondary | ICD-10-CM | POA: Insufficient documentation

## 2018-02-06 DIAGNOSIS — Z981 Arthrodesis status: Secondary | ICD-10-CM | POA: Insufficient documentation

## 2018-02-06 DIAGNOSIS — L89154 Pressure ulcer of sacral region, stage 4: Secondary | ICD-10-CM | POA: Diagnosis not present

## 2018-02-10 NOTE — Progress Notes (Signed)
KARLON, SCHLAFER ([id number]) Visit Report for 02/06/2018 Abuse/Suicide Risk Screen Details Patient Name: [name], [name]. Date of Service: 02/06/2018 8:00 AM Medical Record Number: [id number] Patient Account Number: [id number] Date of Birth/Sex: 1931-03-13 (84 y.o. M) Treating RN: Cornell Barman Primary Care Honestii Marton: Burman Freestone Other Clinician: Referring Marico Buckle: Vaughan Basta Treating Mubarak Bevens/Extender: [name], HOYT Weeks in Treatment: 0 Abuse/Suicide Risk Screen Items Answer ABUSE/SUICIDE RISK SCREEN: Has anyone close to you tried to hurt or harm you recentlyo No Do you feel uncomfortable with anyone in your familyo No Has anyone forced you do things that you didnot want to doo No Do you have any thoughts of harming yourselfo No Patient displays signs or symptoms of abuse and/or neglect. No Electronic Signature(s) Signed: 02/07/2018 12:27:24 PM By: Gretta Cool, BSN, RN, CWS, Kim RN, BSN Entered By: Gretta Cool, BSN, RN, CWS, Kim on 02/06/2018 08:29:32 [name] ([id number]) -------------------------------------------------------------------------------- Activities of Daily Living Details Patient Name: [name]. Date of Service: 02/06/2018 8:00 AM Medical Record Number: [id number] Patient Account Number: [id number] Date of Birth/Sex: 09-Aug-1931 (84 y.o. M) Treating RN: Cornell Barman Primary Care Neveen Daponte: Burman Freestone Other Clinician: Referring Sian Rockers: Vaughan Basta Treating Jovon Winterhalter/Extender: [name], HOYT Weeks in Treatment: 0 Activities of Daily Living Items Answer Activities of Daily Living (Please select one for each item) Drive Automobile Not Able Take Medications Need Assistance Use Telephone Need Assistance Care for Appearance Need Assistance Use Toilet Need Assistance Bath / Shower Need Assistance Dress Self Need Assistance Feed Self Need Assistance Walk Need Assistance Get In / Out Bed Need Assistance Housework Not Able Prepare  Meals Not Able Handle Money Not Able Shop for Self Not Able Electronic Signature(s) Signed: 02/07/2018 12:27:24 PM By: Gretta Cool, BSN, RN, CWS, Kim RN, BSN Entered By: Gretta Cool, BSN, RN, CWS, Kim on 02/06/2018 08:30:14 [name] ([id number]) -------------------------------------------------------------------------------- Education Assessment Details Patient Name: [name] Date of Service: 02/06/2018 8:00 AM Medical Record Number: [id number] Patient Account Number: [id number] Date of Birth/Sex: 22-Jun-1931 (84 y.o. M) Treating RN: Cornell Barman Primary Care Kadeshia Kasparian: Burman Freestone Other Clinician: Referring Lyndsay Talamante: Vaughan Basta Treating Andrius Andrepont/Extender: [name], HOYT Weeks in Treatment: 0 Primary Learner Assessed: Patient Learning Preferences/Education Level/Primary Language Learning Preference: Explanation, Printed Material Highest Education Level: High School Preferred Language: English Cognitive Barrier Assessment/Beliefs Language Barrier: No Translator Needed: No Memory Deficit: No Emotional Barrier: No Cultural/Religious Beliefs Affecting Medical Care: No Physical Barrier Assessment Impaired Vision: No Impaired Hearing: Yes HOH Decreased Hand dexterity: No Knowledge/Comprehension Assessment Knowledge Level: Medium Comprehension Level: Medium Ability to understand written Medium instructions: Ability to understand verbal Medium instructions: Motivation Assessment Anxiety Level: Calm Cooperation: Cooperative Education Importance: Acknowledges Need Interest in Health Problems: Asks Questions Perception: Coherent Willingness to Engage in Self- Medium Management Activities: Readiness to Engage in Self- Medium Management Activities: Electronic Signature(s) Signed: 02/07/2018 12:27:24 PM By: Gretta Cool, BSN, RN, CWS, Kim RN, BSN Entered By: Gretta Cool, BSN, RN, CWS, Kim on 02/06/2018 08:30:41 [name]  ([id number]) -------------------------------------------------------------------------------- Fall Risk Assessment Details Patient Name: [name] Date of Service: 02/06/2018 8:00 AM Medical Record Number: [id number] Patient Account Number: [id number] Date of Birth/Sex: [birth year] (84 y.o. M) Treating RN: Cornell Barman Primary Care Helia Haese: Burman Freestone Other Clinician: Referring Katoria Yetman: Vaughan Basta Treating Caiden Monsivais/Extender: [name], HOYT Weeks in Treatment: 0 Fall Risk Assessment Items Have you had 2 or more falls in the last 12 monthso 0 Yes Have you had any fall that resulted in injury in the last 12 monthso 0 Yes FALL RISK ASSESSMENT:  History of falling - immediate or within 3 months 25 Yes Secondary diagnosis 15 Yes Ambulatory aid None/bed rest/wheelchair/nurse 0 Yes Crutches/cane/walker 15 Yes Furniture 0 No IV Access/Saline Lock 0 No Gait/Training Normal/bed rest/immobile 0 No Weak 10 Yes Impaired 20 Yes Mental Status Oriented to own ability 0 Yes Electronic Signature(s) Signed: 02/07/2018 12:27:24 PM By: Gretta Cool, BSN, RN, CWS, Kim RN, BSN Entered By: Gretta Cool, BSN, RN, CWS, Kim on 02/06/2018 08:31:23 [name] ([id number]) -------------------------------------------------------------------------------- Foot Assessment Details Patient Name: [name]. Date of Service: 02/06/2018 8:00 AM Medical Record Number: [id number] Patient Account Number: [id number] Date of Birth/Sex: 1931-03-07 (84 y.o. M) Treating RN: Cornell Barman Primary Care Raha Tennison: Burman Freestone Other Clinician: Referring Dorette Hartel: Vaughan Basta Treating Ariez Neilan/Extender: [name], HOYT Weeks in Treatment: 0 Foot Assessment Items Site Locations + = Sensation present, - = Sensation absent, C = Callus, U = Ulcer R = Redness, W = Warmth, M = Maceration, PU = Pre-ulcerative lesion F = Fissure, S = Swelling, D = Dryness Assessment Right: Left: Other Deformity: No  No Prior Foot Ulcer: No No Prior Amputation: No No Charcot Joint: No No Ambulatory Status: Gait: Electronic Signature(s) Signed: 02/07/2018 12:27:24 PM By: Gretta Cool, BSN, RN, CWS, Kim RN, BSN Entered By: Gretta Cool, BSN, RN, CWS, Kim on 02/06/2018 08:31:49 [name] ([id number]) -------------------------------------------------------------------------------- Nutrition Risk Assessment Details Patient Name: RANBIR, CHEW. Date of Service: 02/06/2018 8:00 AM Medical Record Number: [id number] Patient Account Number: [id number] Date of Birth/Sex: 04-20-1931 (84 y.o. M) Treating RN: Cornell Barman Primary Care Kayal Mula: Burman Freestone Other Clinician: Referring Delford Wingert: Vaughan Basta Treating Saharra Santo/Extender: [name], HOYT Weeks in Treatment: 0 Height (in): 69 Weight (lbs): 155 Body Mass Index (BMI): 22.9 Nutrition Risk Assessment Items NUTRITION RISK SCREEN: I have an illness or condition that made me change the kind and/or amount of 2 Yes food I eat I eat fewer than two meals per day 0 No I eat few fruits and vegetables, or milk products 0 No I have three or more drinks of beer, liquor or wine almost every day 0 No I have tooth or mouth problems that make it hard for me to eat 0 No I don't always have enough money to buy the food I need 0 No I eat alone most of the time 0 No I take three or more different prescribed or over-the-counter drugs a day 1 Yes Without wanting to, I have lost or gained 10 pounds in the last six months 0 No I am not always physically able to shop, cook and/or feed myself 0 No Nutrition Protocols Good Risk Protocol Moderate Risk Protocol Electronic Signature(s) Signed: 02/07/2018 12:27:24 PM By: Gretta Cool, BSN, RN, CWS, Kim RN, BSN Entered By: Gretta Cool, BSN, RN, CWS, Kim on 02/06/2018 08:31:40

## 2018-02-11 NOTE — Progress Notes (Signed)
RENDELL, THIVIERGE ([id number]) Visit Report for 02/06/2018 Chief Complaint Document Details Patient Name: [name], [name]. Date of Service: 02/06/2018 8:00 AM Medical Record Number: [id number] Patient Account Number: [id number] Date of Birth/Sex: [birth year] (84 y.o. M) Treating RN: Ahmed Prima Primary Care Provider: Burman Freestone Other Clinician: Referring Provider: Vaughan Basta Treating Provider/Extender: [name], HOYT Weeks in Treatment: 0 Information Obtained from: Patient Chief Complaint Sacral pressure ulcer and left shoulder rash Electronic Signature(s) Signed: 02/07/2018 6:07:49 PM By: Worthy Keeler PA-C Entered By: Worthy Keeler on 02/07/2018 18:02:39 [name] ([id number]) -------------------------------------------------------------------------------- Debridement Details Patient Name: [name] Date of Service: 02/06/2018 8:00 AM Medical Record Number: [id number] Patient Account Number: [id number] Date of Birth/Sex: 18-Jul-1931 (84 y.o. M) Treating RN: Cornell Barman Primary Care Provider: Burman Freestone Other Clinician: Referring Provider: Vaughan Basta Treating Provider/Extender: [name], HOYT Weeks in Treatment: 0 Debridement Performed for Wound #1 Sacrum Assessment: Performed By: Physician STONE III, HOYT E., PA-C Debridement Type: Debridement Pre-procedure Verification/Time Yes - 08:55 Out Taken: Start Time: 08:56 Pain Control: Other : lidocaine 4% Total Area Debrided (L x W): 5.2 (cm) x 5 (cm) = 26 (cm) Tissue and other material Viable, Non-Viable, Slough, Subcutaneous, Fascia , Slough debrided: Level: Skin/Subcutaneous Tissue/Muscle Debridement Description: Excisional Instrument: Curette Bleeding: Moderate Hemostasis Achieved: Pressure End Time: 08:58 Procedural Pain: 2 Post Procedural Pain: 0 Response to Treatment: Procedure was tolerated well Post Debridement Measurements of Total Wound Length: (cm)  5.2 Stage: Unstageable/Unclassified Width: (cm) 5.2 Depth: (cm) 2 Volume: (cm) 42.474 Character of Wound/Ulcer Post Requires Further Debridement Debridement: Post Procedure Diagnosis Same as Pre-procedure Electronic Signature(s) Signed: 02/07/2018 12:27:24 PM By: Gretta Cool, BSN, RN, CWS, Kim RN, BSN Signed: 02/07/2018 6:07:49 PM By: Worthy Keeler PA-C Entered By: Gretta Cool, BSN, RN, CWS, Kim on 02/06/2018 08:56:40 Daishaun, Ayre [name] ([id number]) -------------------------------------------------------------------------------- HPI Details Patient Name: [name]. Date of Service: 02/06/2018 8:00 AM Medical Record Number: [id number] Patient Account Number: [id number] Date of Birth/Sex: 1931/02/09 (84 y.o. M) Treating RN: Ahmed Prima Primary Care Provider: Burman Freestone Other Clinician: Referring Provider: Vaughan Basta Treating Provider/Extender: [name], HOYT Weeks in Treatment: 0 History of Present Illness HPI Description: 02/06/18 on evaluation today patient presents for initial evaluation and our clinic concerning an issue which began roughly 3 weeks ago when the patient fell in his home on the floor in his kitchen and laid him down this detergent for roughly 3 days. He had a pressure injury to the left shoulder. This unfortunately has caused him a lot of discomfort although it finally seems to be doing better if anything is really having a lot of itching right now. This appears potentially be a contact dermatitis issue. He also has a significant pressure injury to the sacrum at this time as well which is also showing fascia exposure right over the bone but no evidence of bone exposure at this point which is good news. They have been using Santyl as well as Saline soaked gauze at this point in time. There does appear to be a lot of necrotic slough in the base of the wound. He does have a history of incontinence, myocardial infarction, and hypertension. He also is  "borderline diabetic" hemoglobin A1c of 6.0. Currently he has some discomfort in the pressure site at the sacrum but fortunately nothing too significant this did require sharp debridement today. Electronic Signature(s) Signed: 02/07/2018 6:07:49 PM By: Worthy Keeler PA-C Entered By: Worthy Keeler on 02/06/2018 09:14:16 [name] ([id number]) -------------------------------------------------------------------------------- Physical  Exam Details Patient Name: [name]. Date of Service: 02/06/2018 8:00 AM Medical Record Number: [id number] Patient Account Number: [id number] Date of Birth/Sex: [birth year] (84 y.o. M) Treating RN: Ahmed Prima Primary Care Provider: Burman Freestone Other Clinician: Referring Provider: Vaughan Basta Treating Provider/Extender: STONE III, HOYT Weeks in Treatment: 0 Constitutional patient is hypertensive.. pulse regular and within target range for patient.Marland Kitchen respirations regular, non-labored and within target range for patient.Marland Kitchen temperature within target range for patient.. Chronically ill appearing but in no apparent acute distress. Eyes conjunctiva clear no eyelid edema noted. pupils equal round and reactive to light and accommodation. Ears, Nose, Mouth, and Throat no gross abnormality of ear auricles or external auditory canals. normal hearing noted during conversation. mucus membranes moist. Respiratory normal breathing without difficulty. clear to auscultation bilaterally. Cardiovascular regular rate and rhythm with normal S1, S2. no clubbing, cyanosis, significant edema, <3 sec cap refill. Gastrointestinal (GI) soft, non-tender, non-distended, +BS. no ventral hernia noted. Musculoskeletal Patient unable to walk without assistance. Psychiatric this patient is able to make decisions and demonstrates good insight into disease process. Patient is oriented to person and place. pleasant and cooperative. Notes On evaluation today  the patient sacral wound actually appears to be significantly deep. He in fact has structure exposed specifically fascia and there is some of this which is necrotic although fortunately immediately overlying the sacrum itself the fascia is intact and healthy. There does not appear to be any breakdown or immediate bone exposure which is good news. There was however a significant amount of slough and necrotic material including fascia which was sharply debrided away today and post debridement the wound appear to be doing much better. I did show the patient son who was present during the evaluation both the pre-and post debridement state of the wound. I explained that a Wound VAC would be appropriate in this case we just have to get to that point. Subsequently in regard to the left shoulder region it actually appears that this is more likely to be an irritant contact dermatitis due to the Dawn dish detergent that he laid in for three days after falling. With that being said I think that we can try a topical steroid cream to see if this will be of benefit. There does not appear to be any wound at this point. We will see how this does. Electronic Signature(s) Signed: 02/07/2018 6:07:49 PM By: Worthy Keeler PA-C Entered By: Worthy Keeler on 02/07/2018 18:03:44 Kimball, Appleby [name] ([id number]) -------------------------------------------------------------------------------- Physician Orders Details Patient Name: ROCKEY, GUARINO Date of Service: 02/06/2018 8:00 AM Medical Record Number: [id number] Patient Account Number: [id number] Date of Birth/Sex: August 28, 1931 (84 y.o. M) Treating RN: Cornell Barman Primary Care Provider: Burman Freestone Other Clinician: Referring Provider: Vaughan Basta Treating Provider/Extender: [name], HOYT Weeks in Treatment: 0 Verbal / Phone Orders: No Diagnosis Coding Wound Cleansing Wound #1 Sacrum o Clean wound with Normal Saline. Anesthetic (add to  Medication List) Wound #1 Sacrum o Topical Lidocaine 4% cream applied to wound bed prior to debridement (In Clinic Only). Skin Barriers/Peri-Wound Care Wound #1 Sacrum o Barrier cream Primary Wound Dressing Wound #1 Sacrum o Dry Gauze - soaked in Vashe or Dakins and packed into wound Secondary Dressing Wound #1 Sacrum o ABD pad o Other - Drawtex or equal Dressing Change Frequency Wound #1 Sacrum o Change dressing every day. Follow-up Appointments Wound #1 Sacrum o Return Appointment in 1 week. Off-Loading Wound #1 Sacrum o Turn and reposition every 2 hours Home Health  Wound #1 [place] Visits - [place] Nurse may visit PRN to address patientos wound care needs. o FACE TO FACE ENCOUNTER: MEDICARE and MEDICAID PATIENTS: I certify that this patient is under my care and that I had a face-to-face encounter that meets the physician face-to-face encounter requirements with this patient on this date. The encounter with the patient was in whole or in part for the following MEDICAL CONDITION: (primary reason for [place]) MEDICAL NECESSITY: I certify, that based on my findings, JARRAH, BABICH ([id number]) NURSING services are a medically necessary home health service. HOME BOUND STATUS: I certify that my clinical findings support that this patient is homebound (i.e., Due to illness or injury, pt requires aid of supportive devices such as crutches, cane, wheelchairs, walkers, the use of special transportation or the assistance of another person to leave their place of residence. There is a normal inability to leave the home and doing so requires considerable and taxing effort. Other absences are for medical reasons / religious services and are infrequent or of short duration when for other reasons). o If current dressing causes regression in wound condition, may D/C ordered dressing product/s and apply Normal Saline Moist  Dressing daily until next [place] / Other MD appointment. [place] of regression in wound condition at [phone]. o Please direct any NON-WOUND related issues/requests for orders to patient's Primary Care Physician Medications-please add to medication list. Wound #1 Sacrum o Other: - TAC on back and shoulder Radiology o X-ray, coccyx Patient Medications Allergies: Lyrica, gabapentin, Sulfa (Sulfonamide Antibiotics), contrast dye, budesonide, iohexol, simvastatin, Demerol, meperidine, adhesive tape, naproxen, pregabalin Notifications Medication Indication Start End triamcinolone acetonide 02/06/2018 DOSE topical 0.1 % cream - cream topical apply a thin film to the affected left shoulder region in the morning alternating with Eucerin to the area in the evening PRN Electronic Signature(s) Signed: 02/07/2018 12:27:24 PM By: Gretta Cool, BSN, RN, CWS, Kim RN, BSN Signed: 02/07/2018 6:07:49 PM By: Worthy Keeler PA-C Previous Signature: 02/06/2018 9:29:48 AM Version By: Worthy Keeler PA-C Entered By: Gretta Cool, BSN, RN, CWS, Kim on 02/06/2018 11:28:26 OCTAVIO, MATHENEY ([id number]) -------------------------------------------------------------------------------- Problem List Details Patient Name: GREYSEN, DEVINO. Date of Service: 02/06/2018 8:00 AM Medical Record Number: [id number] Patient Account Number: [id number] Date of Birth/Sex: 07-13-1931 (84 y.o. M) Treating RN: Ahmed Prima Primary Care Provider: Burman Freestone Other Clinician: Referring Provider: Vaughan Basta Treating Provider/Extender: [name], HOYT Weeks in Treatment: 0 Active Problems ICD-10 Impacting Encounter Code Description Active Date Wound Healing Diagnosis L89.154 Pressure ulcer of sacral region, stage 4 02/06/2018 Yes L24.0 Irritant contact dermatitis due to detergents 02/06/2018 Yes Inactive Problems Resolved Problems Electronic Signature(s) Signed: 02/07/2018 6:07:49  PM By: Worthy Keeler PA-C Entered By: Worthy Keeler on 02/06/2018 21:17:08 [name] ([id number]) -------------------------------------------------------------------------------- Progress Note Details Patient Name: [name] Date of Service: 02/06/2018 8:00 AM Medical Record Number: [id number] Patient Account Number: [id number] Date of Birth/Sex: 1931/06/03 (84 y.o. M) Treating RN: Ahmed Prima Primary Care Provider: Burman Freestone Other Clinician: Referring Provider: Vaughan Basta Treating Provider/Extender: [name], HOYT Weeks in Treatment: 0 Subjective Chief Complaint Information obtained from Patient Sacral pressure ulcer and left shoulder rash History of Present Illness (HPI) 02/06/18 on evaluation today patient presents for initial evaluation and our clinic concerning an issue which began roughly 3 weeks ago when the patient fell in his home on the floor in his kitchen and laid him down this detergent for  roughly 3 days. He had a pressure injury to the left shoulder. This unfortunately has caused him a lot of discomfort although it finally seems to be doing better if anything is really having a lot of itching right now. This appears potentially be a contact dermatitis issue. He also has a significant pressure injury to the sacrum at this time as well which is also showing fascia exposure right over the bone but no evidence of bone exposure at this point which is good news. They have been using Santyl as well as Saline soaked gauze at this point in time. There does appear to be a lot of necrotic slough in the base of the wound. He does have a history of incontinence, myocardial infarction, and hypertension. He also is "borderline diabetic" hemoglobin A1c of 6.0. Currently he has some discomfort in the pressure site at the sacrum but fortunately nothing too significant this did require sharp debridement today. Wound History Patient presents with 2 open  wounds that have been present for approximately 3 weeks. The wounds have been healed in the past but have re-opened. Laboratory tests have been performed in the last month. Patient reportedly has tested positive for an antibiotic resistant organism. Patient reportedly has not tested positive for osteomyelitis. Patient reportedly has not had testing performed to evaluate circulation in the legs. Patient History Information obtained from Patient, Caregiver. Allergies Lyrica, gabapentin, Sulfa (Sulfonamide Antibiotics), contrast dye, budesonide, iohexol, simvastatin, Demerol, meperidine, adhesive tape, naproxen, pregabalin Family History Cancer - Paternal Grandparents, Diabetes - Father, Heart Disease - Mother,Father, Stroke - Father, No family history of Hypertension, Kidney Disease, Lung Disease, Seizures, Thyroid Problems, Tuberculosis. Social History Never smoker, Marital Status - Widowed, Alcohol Use - Never, Drug Use - No History, Caffeine Use - Daily. Medical History Eyes Patient has history of Cataracts - bilateral removal Denies history of Glaucoma, Optic Neuritis Ear/Nose/Mouth/Throat Denies history of Chronic sinus problems/congestion, Middle ear problems Hematologic/Lymphatic Denies history of Anemia, Hemophilia, Human Immunodeficiency Virus, Lymphedema, Sickle Cell Disease DEWAUN, KINZLER ([id number]) Respiratory Patient has history of Asthma Denies history of Aspiration, Chronic Obstructive Pulmonary Disease (COPD), Pneumothorax, Sleep Apnea, Tuberculosis Cardiovascular Patient has history of Angina, Arrhythmia, Coronary Artery Disease, Hypertension, Myocardial Infarction - 2001 Denies history of Congestive Heart Failure, Deep Vein Thrombosis, Hypotension, Peripheral Arterial Disease, Peripheral Venous Disease, Phlebitis, Vasculitis Gastrointestinal Denies history of Cirrhosis , Colitis, Crohn s, Hepatitis A, Hepatitis B, Hepatitis C Endocrine Denies history of Type I  Diabetes, Type II Diabetes Genitourinary Denies history of End Stage Renal Disease Immunological Denies history of Lupus Erythematosus, Raynaud s, Scleroderma Integumentary (Skin) Denies history of History of Burn, History of pressure wounds Musculoskeletal Patient has history of Osteoarthritis Denies history of Gout, Rheumatoid Arthritis, Osteomyelitis Neurologic Patient has history of Dementia, Neuropathy Denies history of Quadriplegia, Paraplegia, Seizure Disorder Oncologic Denies history of Received Chemotherapy, Received Radiation Psychiatric Denies history of Anorexia/bulimia, Confinement Anxiety Hospitalization/Surgery History - 01/20/2018, ARMS, Fall. Medical And Surgical History Notes Endocrine Borderline Oncologic Melanoma on back Review of Systems (ROS) Constitutional Symptoms (General Health) The patient has no complaints or symptoms. Eyes Complains or has symptoms of Vision Changes - glasses. Denies complaints or symptoms of Dry Eyes. Ear/Nose/Mouth/Throat The patient has no complaints or symptoms. Hematologic/Lymphatic The patient has no complaints or symptoms. Respiratory Complains or has symptoms of Shortness of Breath. Denies complaints or symptoms of Chronic or frequent coughs. Cardiovascular Complains or has symptoms of Chest pain. Denies complaints or symptoms of LE edema. Gastrointestinal Complains or has symptoms of  Frequent diarrhea. Denies complaints or symptoms of Nausea. Endocrine The patient has no complaints or symptoms. Genitourinary Complains or has symptoms of Incontinence/dribbling. Denies complaints or symptoms of Kidney failure/ Dialysis. JSHAWN, HURTA ([id number]) Immunological Complains or has symptoms of Itching - left shoulder. Denies complaints or symptoms of Hives. Integumentary (Skin) Complains or has symptoms of Wounds, Bleeding or bruising tendency. Denies complaints or symptoms of Breakdown,  Swelling. Musculoskeletal Complains or has symptoms of Muscle Weakness. Denies complaints or symptoms of Muscle Pain. Neurologic Complains or has symptoms of Numbness/parasthesias - hands and feet. Denies complaints or symptoms of Focal/Weakness. Oncologic The patient has no complaints or symptoms. Psychiatric The patient has no complaints or symptoms. Objective Constitutional patient is hypertensive.. pulse regular and within target range for patient.Marland Kitchen respirations regular, non-labored and within target range for patient.Marland Kitchen temperature within target range for patient.. Chronically ill appearing but in no apparent acute distress. Vitals Time Taken: 8:12 AM, Height: 69 in, Weight: 155 lbs, BMI: 22.9, Temperature: 98.4 F, Pulse: 62 bpm, Respiratory Rate: 16 breaths/min, Blood Pressure: 138/56 mmHg, Pulse Oximetry: 100 %. Eyes conjunctiva clear no eyelid edema noted. pupils equal round and reactive to light and accommodation. Ears, Nose, Mouth, and Throat no gross abnormality of ear auricles or external auditory canals. normal hearing noted during conversation. mucus membranes moist. Respiratory normal breathing without difficulty. clear to auscultation bilaterally. Cardiovascular regular rate and rhythm with normal S1, S2. no clubbing, cyanosis, significant edema, Gastrointestinal (GI) soft, non-tender, non-distended, +BS. no ventral hernia noted. Musculoskeletal Patient unable to walk without assistance. Psychiatric this patient is able to make decisions and demonstrates good insight into disease process. Patient is oriented to person and place. pleasant and cooperative. General Notes: On evaluation today the patient sacral wound actually appears to be significantly deep. He in fact has RYNE, MCTIGUE ([id number]) structure exposed specifically fascia and there is some of this which is necrotic although fortunately immediately overlying the sacrum itself the fascia is intact and  healthy. There does not appear to be any breakdown or immediate bone exposure which is good news. There was however a significant amount of slough and necrotic material including fascia which was sharply debrided away today and post debridement the wound appear to be doing much better. I did show the patient son who was present during the evaluation both the pre-and post debridement state of the wound. I explained that a Wound VAC would be appropriate in this case we just have to get to that point. Subsequently in regard to the left shoulder region it actually appears that this is more likely to be an irritant contact dermatitis due to the Dawn dish detergent that he laid in for three days after falling. With that being said I think that we can try a topical steroid cream to see if this will be of benefit. There does not appear to be any wound at this point. We will see how this does. Integumentary (Hair, Skin) Wound #1 status is Open. Original cause of wound was Pressure Injury. The wound is located on the Sacrum. The wound measures 5.2cm length x 5cm width x 2cm depth; 20.42cm^2 area and 40.841cm^3 volume. There is no tunneling noted, however, there is undermining starting at 6:00 and ending at 4:00 with a maximum distance of 2.4cm. There is a large amount of serous drainage noted. Foul odor after cleansing was noted. The wound margin is distinct with the outline attached to the wound base. There is small (1-33%) red granulation within the  wound bed. There is a large (67-100%) amount of necrotic tissue within the wound bed including Adherent Slough. The periwound skin appearance exhibited: Maceration, Erythema. The surrounding wound skin color is noted with erythema which is circumferential. Periwound temperature was noted as No Abnormality. The periwound has tenderness on palpation. Other Condition(s) Patient presents with Rash / Dermatitis located on the Left Left shoulder/back. The skin  appearance exhibited: Dry/Scaly, Rash, Rubor. The skin appearance did not exhibit: Atrophie Blanche, Callus, Crepitus, Cyanosis, Ecchymosis, Erythema, Excoriation, Friable, Hemosiderin Staining, Induration, Maceration, Mottled, Pallor, Scarring. General Notes: skin felt warm Assessment Active Problems ICD-10 L89.154 - Pressure ulcer of sacral region, stage 4 L24.0 - Irritant contact dermatitis due to detergents Procedures Wound #1 Pre-procedure diagnosis of Wound #1 is a Pressure Ulcer located on the Sacrum . There was a Excisional Skin/Subcutaneous Tissue/Muscle Debridement with a total area of 26 sq cm performed by STONE III, HOYT E., PA-C. With the following instrument(s): Curette. to remove Viable and Non-Viable tissue/material Material removed includes Subcutaneous Tissue, and Slough, [place] after achieving pain control using Other (lidocaine 4%). No specimens were taken. A time out was conducted at 08:55, prior to the start of the procedure. A Moderate amount of bleeding was controlled with Pressure. The procedure was tolerated well with a pain level of 2 throughout and a pain level of 0 following the procedure. Post Debridement Measurements: 5.2cm length x 5.2cm width x 2cm depth; 42.474cm^3 volume. Post debridement Stage noted as Unstageable/Unclassified. Character of Wound/Ulcer Post Debridement requires further debridement. Post procedure Diagnosis Wound #1: Same as Pre-Procedure JEN, EPPINGER ([id number]) Plan Wound Cleansing: Wound #1 Sacrum: Clean wound with Normal Saline. Anesthetic (add to Medication List): Wound #1 Sacrum: Topical Lidocaine 4% cream applied to wound bed prior to debridement (In Clinic Only). Skin Barriers/Peri-Wound Care: Wound #1 Sacrum: Barrier cream Primary Wound Dressing: Wound #1 Sacrum: Dry Gauze - soaked in Vashe or Dakins and packed into wound Secondary Dressing: Wound #1 Sacrum: ABD pad Other - Drawtex or equal Dressing  Change Frequency: Wound #1 Sacrum: Change dressing every day. Follow-up Appointments: Wound #1 Sacrum: Return Appointment in 1 week. Off-Loading: Wound #1 Sacrum: Turn and reposition every 2 hours Home Health: Wound #1 Sacrum: Continue Home Health Visits - [facility] Nurse may visit PRN to address patient s wound care needs. FACE TO FACE ENCOUNTER: MEDICARE and MEDICAID PATIENTS: I certify that this patient is under my care and that I had a face-to-face encounter that meets the physician face-to-face encounter requirements with this patient on this date. The encounter with the patient was in whole or in part for the following MEDICAL CONDITION: (primary reason for [place]) MEDICAL NECESSITY: I certify, that based on my findings, NURSING services are a medically necessary home health service. HOME BOUND STATUS: I certify that my clinical findings support that this patient is homebound (i.e., Due to illness or injury, pt requires aid of supportive devices such as crutches, cane, wheelchairs, walkers, the use of special transportation or the assistance of another person to leave their place of residence. There is a normal inability to leave the home and doing so requires considerable and taxing effort. Other absences are for medical reasons / religious services and are infrequent or of short duration when for other reasons). If current dressing causes regression in wound condition, may D/C ordered dressing product/s and apply Normal Saline Moist Dressing daily until next [place] / Other MD appointment. [place] of regression in  wound condition at [phone]. Please direct any NON-WOUND related issues/requests for orders to patient's Primary Care Physician Medications-please add to medication list.: Wound #1 Sacrum: Other: - TAC on back and shoulder Radiology ordered were: X-ray, coccyx The following medication(s) was prescribed:  triamcinolone acetonide topical 0.1 % cream cream topical apply a thin film to the affected left shoulder region in the morning alternating with Eucerin to the area in the evening PRN starting 02/06/2018 RUDY, LUHMANN ([id number]) I did go ahead and send them a topical triamcinolone 0.1% cream which I recommend be applied to the left shoulder region in the morning and then use or and or a light cream to be applied to the area in the evening. We will see how this does over the next week. I really do not feel like this is likely a fungal infection. Subsequently we will also initiate treatment with Vashe in place of the Dakin solution to see how this does for the patient. Hopefully will help clean up the wound I have had good experience with this similar to Dakin's soaked gauze treatment. We did actually give the patient a sample of the Vashe that we had in the office today. We will see were things stand at follow-up my goal is to get to the point of using a Wound VAC as soon as possible. Please see above for specific wound care orders. We will see patient for re-evaluation in 1 week(s) here in the clinic. If anything worsens or changes patient will contact our office for additional recommendations. Electronic Signature(s) Signed: 02/07/2018 6:07:49 PM By: Worthy Keeler PA-C Entered By: Worthy Keeler on 02/07/2018 18:04:03 [name] ([id number]) -------------------------------------------------------------------------------- ROS/PFSH Details Patient Name: [name] Date of Service: 02/06/2018 8:00 AM Medical Record Number: [id number] Patient Account Number: [id number] Date of Birth/Sex: 01-07-1931 (84 y.o. M) Treating RN: Cornell Barman Primary Care Provider: Burman Freestone Other Clinician: Referring Provider: Vaughan Basta Treating Provider/Extender: [name], HOYT Weeks in Treatment: 0 Information Obtained From Patient Caregiver Wound History Do you currently  have one or more open woundso Yes How many open wounds do you currently haveo 2 Approximately how long have you had your woundso 3 weeks Has your wound(s) ever healed and then re-openedo Yes Have you had any lab work done in the past montho Yes Have you tested positive for osteomyelitis (bone infection)o No Have you had any tests for circulation on your legso No Constitutional Symptoms (General Health) Complaints and Symptoms: No Complaints or Symptoms Complaints and Symptoms: Negative for: Fatigue; Fever; Chills; Marked Weight Change Eyes Complaints and Symptoms: Positive for: Vision Changes - glasses Negative for: Dry Eyes Medical History: Positive for: Cataracts - bilateral removal Negative for: Glaucoma; Optic Neuritis Respiratory Complaints and Symptoms: Positive for: Shortness of Breath Negative for: Chronic or frequent coughs Medical History: Positive for: Asthma Negative for: Aspiration; Chronic Obstructive Pulmonary Disease (COPD); Pneumothorax; Sleep Apnea; Tuberculosis Cardiovascular Complaints and Symptoms: Positive for: Chest pain Negative for: LE edema Medical History: Positive for: Angina; Arrhythmia; Coronary Artery Disease; Hypertension; Myocardial Infarction - 2001 Negative for: Congestive Heart Failure; Deep Vein Thrombosis; Hypotension; Peripheral Arterial Disease; Peripheral Venous Disease; Phlebitis; Vasculitis URBANO, MILHOUSE ([id number]) Gastrointestinal Complaints and Symptoms: Positive for: Frequent diarrhea Negative for: Nausea Medical History: Negative for: Cirrhosis ; Colitis; Crohnos; Hepatitis A; Hepatitis B; Hepatitis C Genitourinary Complaints and Symptoms: Positive for: Incontinence/dribbling Negative for: Kidney failure/ Dialysis Medical History: Negative for: End Stage Renal Disease Immunological Complaints and Symptoms: Positive for: Itching - left  shoulder Negative for: Hives Medical History: Negative for: Lupus Erythematosus;  Raynaudos; Scleroderma Integumentary (Skin) Complaints and Symptoms: Positive for: Wounds; Bleeding or bruising tendency Negative for: Breakdown; Swelling Medical History: Negative for: History of Burn; History of pressure wounds Musculoskeletal Complaints and Symptoms: Positive for: Muscle Weakness Negative for: Muscle Pain Medical History: Positive for: Osteoarthritis Negative for: Gout; Rheumatoid Arthritis; Osteomyelitis Neurologic Complaints and Symptoms: Positive for: Numbness/parasthesias - hands and feet Negative for: Focal/Weakness Medical History: Positive for: Dementia; Neuropathy Negative for: Quadriplegia; Paraplegia; Seizure Disorder Ear/Nose/Mouth/Throat Complaints and Symptoms: No Complaints or Symptoms TAEQUAN, STOCKHAUSEN ([id number]) Medical History: Negative for: Chronic sinus problems/congestion; Middle ear problems Hematologic/Lymphatic Complaints and Symptoms: No Complaints or Symptoms Medical History: Negative for: Anemia; Hemophilia; Human Immunodeficiency Virus; Lymphedema; Sickle Cell Disease Endocrine Complaints and Symptoms: No Complaints or Symptoms Medical History: Negative for: Type I Diabetes; Type II Diabetes Past Medical History Notes: Borderline Oncologic Complaints and Symptoms: No Complaints or Symptoms Medical History: Negative for: Received Chemotherapy; Received Radiation Past Medical History Notes: Melanoma on back Psychiatric Complaints and Symptoms: No Complaints or Symptoms Medical History: Negative for: Anorexia/bulimia; Confinement Anxiety HBO Extended History Items Eyes: Cataracts Immunizations Pneumococcal Vaccine: Received Pneumococcal Vaccination: Yes Implantable Devices Hospitalization / Surgery History Name of Hospital Purpose of Hospitalization/Surgery Date [facility] 01/20/2018 Family and Social History Cancer: Yes - Paternal Grandparents; Diabetes: Yes - Father; Heart Disease: Yes - Mother,Father;  Hypertension: No; Kidney Disease: No; Lung Disease: No; Seizures: No; Stroke: Yes - Father; Thyroid Problems: No; Tuberculosis: No; Never smoker; Marital Status - Widowed; Alcohol Use: Never; Drug Use: No History; Caffeine Use: Daily; Financial Concerns: No; Food, TEYTON, PATTILLO ([id number]) Clothing or Shelter Needs: No; Support System Lacking: No; Transportation Concerns: No; Advanced Directives: Yes (Not Provided); Do not resuscitate: Yes (Not Provided); Living Will: Yes (Not Provided); Medical Power of Attorney: Yes - Hicks Feick (Not Provided) Electronic Signature(s) Signed: 02/07/2018 12:27:24 PM By: Gretta Cool BSN, RN, CWS, Kim RN, BSN Signed: 02/07/2018 6:07:49 PM By: Worthy Keeler PA-C Entered By: Gretta Cool BSN, RN, CWS, Kim on 02/06/2018 08:29:13 JAQUIL, TODT ([id number]) -------------------------------------------------------------------------------- SuperBill Details Patient Name: KEIGO, WHALLEY. Date of Service: 02/06/2018 Medical Record Number: [id number] Patient Account Number: [id number] Date of Birth/Sex: 11-Jan-1931 (84 y.o. M) Treating RN: Ahmed Prima Primary Care Provider: Burman Freestone Other Clinician: Referring Provider: Vaughan Basta Treating Provider/Extender: [name], HOYT Weeks in Treatment: 0 Diagnosis Coding ICD-10 Codes Code Description L89.154 Pressure ulcer of sacral region, stage 4 L24.0 Irritant contact dermatitis due to detergents Facility Procedures CPT4 Code: 27253664 Description: 99213 - WOUND CARE VISIT-LEV 3 EST PT Modifier: Quantity: 1 CPT4 Code: 40347425 Description: 11043 - DEB MUSC/FASCIA 20 SQ CM/< ICD-10 Diagnosis Description L89.154 Pressure ulcer of sacral region, stage 4 Modifier: Quantity: 1 CPT4 Code: 95638756 Description: 11046 - DEB MUSC/FASCIA EA ADDL 20 CM ICD-10 Diagnosis Description L89.154 Pressure ulcer of sacral region, stage 4 Modifier: Quantity: 1 Physician Procedures CPT4 Code:  4332951 Description: 88416 - WC PHYS LEVEL 4 - NEW PT ICD-10 Diagnosis Description L89.154 Pressure ulcer of sacral region, stage 4 L24.0 Irritant contact dermatitis due to detergents Modifier: 25 Quantity: 1 CPT4 Code: 6063016 Description: 11043 - WC PHYS DEBR MUSCLE/FASCIA 20 SQ CM ICD-10 Diagnosis Description L89.154 Pressure ulcer of sacral region, stage 4 Modifier: Quantity: 1 CPT4 Code: 0109323 Description: 11046 - WC PHYS DEB MUSC/FASC EA ADDL 20 CM ICD-10 Diagnosis Description L89.154 Pressure ulcer of sacral region, stage 4 Modifier: Quantity: 1 Electronic Signature(s) Signed: 02/07/2018 6:07:49 PM By: Joaquim Lai  III, Hoyt PA-C Entered By: Worthy Keeler on 02/07/2018 18:04:30

## 2018-02-11 NOTE — Progress Notes (Signed)
[name], [name] ([id number]) Visit Report for 02/06/2018 Allergy List Details Patient Name: [name], [name]. Date of Service: 02/06/2018 8:00 AM Medical Record Number: [id number] Patient Account Number: [id number] Date of Birth/Sex: [birth year] (84 y.o. M) Treating RN: Cornell Barman Primary Care Lakea Mittelman: Burman Freestone Other Clinician: Referring Geraldo Haris: Vaughan Basta Treating Aiman Noe/Extender: [name], HOYT Weeks in Treatment: 0 Allergies Active Allergies Lyrica gabapentin Sulfa (Sulfonamide Antibiotics) contrast dye budesonide iohexol simvastatin Demerol meperidine adhesive tape naproxen pregabalin Allergy Notes Electronic Signature(s) Signed: 02/07/2018 12:27:24 PM By: Gretta Cool, BSN, RN, CWS, Kim RN, BSN Entered By: Gretta Cool, BSN, RN, CWS, Kim on 02/06/2018 08:17:25 [name] ([id number]) -------------------------------------------------------------------------------- Arrival Information Details Patient Name: [name], [name]. Date of Service: 02/06/2018 8:00 AM Medical Record Number: [id number] Patient Account Number: [id number] Date of Birth/Sex: Oct 04, 1931 (84 y.o. M) Treating RN: Cornell Barman Primary Care Jasiah Buntin: Burman Freestone Other Clinician: Referring Azana Kiesler: Vaughan Basta Treating Peightyn Roberson/Extender: [name], HOYT Weeks in Treatment: 0 Visit Information Patient Arrived: Wheel Chair Arrival Time: 08:11 Accompanied By: son Transfer Assistance: Manual Patient Identification Verified: Yes Secondary Verification Process Completed: Yes Patient Requires Transmission-Based No Precautions: Patient Has Alerts: Yes Electronic Signature(s) Signed: 02/07/2018 12:27:24 PM By: Gretta Cool, BSN, RN, CWS, Kim RN, BSN Entered By: Gretta Cool, BSN, RN, CWS, Kim on 02/06/2018 08:11:57 [name] ([id number]) -------------------------------------------------------------------------------- Clinic Level of Care Assessment Details Patient Name: [name], [name]. Date of Service: 02/06/2018 8:00 AM Medical Record Number: [id number] Patient Account Number: [id number] Date of Birth/Sex: [birth year] (84 y.o. M) Treating RN: Cornell Barman Primary Care Kim Oki: Burman Freestone Other Clinician: Referring Betty Daidone: Vaughan Basta Treating Tenecia Ignasiak/Extender: [name], HOYT Weeks in Treatment: 0 Clinic Level of Care Assessment Items TOOL 1 Quantity Score []  - Use when EandM and Procedure is performed on INITIAL visit 0 ASSESSMENTS - Nursing Assessment / Reassessment X - General Physical Exam (combine w/ comprehensive assessment (listed just below) when 1 20 performed on new pt. evals) X- 1 25 Comprehensive Assessment (HX, ROS, Risk Assessments, Wounds Hx, etc.) ASSESSMENTS - Wound and Skin Assessment / Reassessment X - Dermatologic / Skin Assessment (not related to wound area) 1 10 ASSESSMENTS - Ostomy and/or Continence Assessment and Care []  - Incontinence Assessment and Management 0 []  - 0 Ostomy Care Assessment and Management (repouching, etc.) PROCESS - Coordination of Care X - Simple Patient / Family Education for ongoing care 1 15 []  - 0 Complex (extensive) Patient / Family Education for ongoing care X- 1 10 Staff obtains Programmer, systems, Records, Test Results / Process Orders []  - 0 Staff telephones HHA, Nursing Homes / Clarify orders / etc []  - 0 Routine Transfer to another Facility (non-emergent condition) []  - 0 Routine Hospital Admission (non-emergent condition) X- 1 15 New Admissions / Biomedical engineer / Ordering NPWT, Apligraf, etc. []  - 0 Emergency Hospital Admission (emergent condition) PROCESS - Special Needs []  - Pediatric / Minor Patient Management 0 []  - 0 Isolation Patient Management []  - 0 Hearing / Language / Visual special needs []  - 0 Assessment of Community assistance (transportation, D/C planning, etc.) []  - 0 Additional assistance / Altered mentation []  - 0 Support Surface(s) Assessment (bed,  cushion, seat, etc.) FLINT, HAKEEM ([id number]) INTERVENTIONS - Miscellaneous []  - External ear exam 0 X- 1 10 Patient Transfer (multiple staff / Civil Service fast streamer / Similar devices) []  - 0 Simple Staple / Suture removal (25 or less) []  - 0 Complex Staple / Suture removal (26 or more) []  - 0 Hypo/Hyperglycemic Management (do not check if billed separately) []  -  0 Ankle / Brachial Index (ABI) - do not check if billed separately Has the patient been seen at the hospital within the last three years: Yes Total Score: 105 Level Of Care: New/Established - Level 3 Electronic Signature(s) Signed: 02/07/2018 12:27:24 PM By: Gretta Cool, BSN, RN, CWS, Kim RN, BSN Entered By: Gretta Cool, BSN, RN, CWS, Kim on 02/06/2018 12:01:46 [name] ([id number]) -------------------------------------------------------------------------------- Encounter Discharge Information Details Patient Name: [name], [name]. Date of Service: 02/06/2018 8:00 AM Medical Record Number: [id number] Patient Account Number: [id number] Date of Birth/Sex: Aug 31, 1931 (84 y.o. M) Treating RN: Ahmed Prima Primary Care Emelyn Roen: Burman Freestone Other Clinician: Referring Quinnten Calvin: Vaughan Basta Treating Milka Windholz/Extender: [name], HOYT Weeks in Treatment: 0 Encounter Discharge Information Items Discharge Pain Level: 0 Discharge Condition: Stable Ambulatory Status: Wheelchair Discharge Destination: Home Transportation: Private Auto Accompanied By: son Schedule Follow-up Appointment: Yes Medication Reconciliation completed and No provided to Patient/Care Amia Rynders: Provided on Clinical Summary of Care: 02/06/2018 Form Type Recipient Paper Patient AR Electronic Signature(s) Signed: 02/06/2018 11:26:37 AM By: Gretta Cool, BSN, RN, CWS, Kim RN, BSN Entered By: Gretta Cool, BSN, RN, CWS, Kim on 02/06/2018 11:26:36 [name] ([id number]) -------------------------------------------------------------------------------- Lower  Extremity Assessment Details Patient Name: [name], [name]. Date of Service: 02/06/2018 8:00 AM Medical Record Number: [id number] Patient Account Number: [id number] Date of Birth/Sex: [birth year] (84 y.o. M) Treating RN: Cornell Barman Primary Care Kresta Templeman: Burman Freestone Other Clinician: Referring Dwayne Bulkley: Vaughan Basta Treating Eleftheria Taborn/Extender: Sharalyn Ink in Treatment: 0 Electronic Signature(s) Signed: 02/07/2018 12:27:24 PM By: Gretta Cool, BSN, RN, CWS, Kim RN, BSN Entered By: Gretta Cool, BSN, RN, CWS, Kim on 02/06/2018 08:31:59 [name] ([id number]) -------------------------------------------------------------------------------- Multi Wound Chart Details Patient Name: [name], [name]. Date of Service: 02/06/2018 8:00 AM Medical Record Number: [id number] Patient Account Number: [id number] Date of Birth/Sex: 1931/08/16 (84 y.o. M) Treating RN: Cornell Barman Primary Care Allura Doepke: Burman Freestone Other Clinician: Referring Jameel Quant: Vaughan Basta Treating Marrion Finan/Extender: [name], HOYT Weeks in Treatment: 0 Vital Signs Height(in): 69 Pulse(bpm): 21 Weight(lbs): 155 Blood Pressure(mmHg): 138/56 Body Mass Index(BMI): 23 Temperature(F): 98.4 Respiratory Rate 16 (breaths/min): Photos: [1:No Photos] [N/A:N/A] Wound Location: [1:Sacrum] [N/A:N/A] Wounding Event: [1:Pressure Injury] [N/A:N/A] Primary Etiology: [1:Pressure Ulcer] [N/A:N/A] Comorbid History: [1:Cataracts, Asthma, Angina, Arrhythmia, Coronary Artery Disease, Hypertension, Myocardial Infarction, Osteoarthritis, Dementia, Neuropathy] [N/A:N/A] Date Acquired: [1:01/17/2018] [N/A:N/A] Weeks of Treatment: [1:0] [N/A:N/A] Wound Status: [1:Open] [N/A:N/A] Measurements L x W x D [1:5.2x5x0.2] [N/A:N/A] (cm) Area (cm) : [1:20.42] [N/A:N/A] Volume (cm) : [1:4.084] [N/A:N/A] Starting Position 1 [1:6] (o'clock): Ending Position 1 [1:4] (o'clock): Maximum Distance 1 (cm):  [1:2.4] Undermining: [1:Yes] [N/A:N/A] Classification: [1:Unstageable/Unclassified] [N/A:N/A] Exudate Amount: [1:Large] [N/A:N/A] Exudate Type: [1:Serous] [N/A:N/A] Exudate Color: [1:amber] [N/A:N/A] Foul Odor After Cleansing: [1:Yes] [N/A:N/A] Odor Anticipated Due to [1:No] [N/A:N/A] Product Use: Wound Margin: [1:Distinct, outline attached] [N/A:N/A] Granulation Amount: [1:Small (1-33%)] [N/A:N/A] Granulation Quality: [1:Red] [N/A:N/A] Necrotic Amount: [1:Large (67-100%)] [N/A:N/A] Exposed Structures: [1:Fascia: No Fat Layer (Subcutaneous Tissue) Exposed: No] [N/A:N/A] Tendon: No Muscle: No Joint: No Bone: No Epithelialization: None N/A N/A Debridement: Debridement - Excisional N/A N/A Pre-procedure 08:55 N/A N/A Verification/Time Out Taken: Pain Control: Other N/A N/A Tissue Debrided: Subcutaneous, Slough N/A N/A Level: Skin/Subcutaneous N/A N/A Tissue/Muscle Debridement Area (sq cm): 26 N/A N/A Instrument: Curette N/A N/A Bleeding: Moderate N/A N/A Hemostasis Achieved: Pressure N/A N/A Procedural Pain: 2 N/A N/A Post Procedural Pain: 0 N/A N/A Debridement Treatment Procedure was tolerated well N/A N/A Response: Post Debridement 5.2x5.2x2 N/A N/A Measurements L x W x D (cm) Post Debridement Volume: 42.474 N/A  N/A (cm) Post Debridement Stage: Unstageable/Unclassified N/A N/A Periwound Skin Texture: No Abnormalities Noted N/A N/A Periwound Skin Moisture: Maceration: Yes N/A N/A Periwound Skin Color: Erythema: Yes N/A N/A Erythema Location: Circumferential N/A N/A Temperature: No Abnormality N/A N/A Tenderness on Palpation: Yes N/A N/A Wound Preparation: Ulcer Cleansing: N/A N/A Rinsed/Irrigated with Saline Topical Anesthetic Applied: Other: lidocaine 4% Procedures Performed: Debridement N/A N/A Treatment Notes Electronic Signature(s) Signed: 02/07/2018 12:27:24 PM By: Gretta Cool, BSN, RN, CWS, Kim RN, BSN Entered By: Gretta Cool, BSN, RN, CWS, Kim on 02/06/2018  08:56:48 [name] ([id number]) -------------------------------------------------------------------------------- Multi-Disciplinary Care Plan Details Patient Name: [name], [name]. Date of Service: 02/06/2018 8:00 AM Medical Record Number: [id number] Patient Account Number: [id number] Date of Birth/Sex: [birth year] (84 y.o. M) Treating RN: Cornell Barman Primary Care Tymara Saur: Burman Freestone Other Clinician: Referring Doralene Glanz: Vaughan Basta Treating Saroya Riccobono/Extender: [name], HOYT Weeks in Treatment: 0 Active Inactive ` Abuse / Safety / Falls / Self Care Management Nursing Diagnoses: History of Falls Goals: Patient will not experience any injury related to falls Date Initiated: 02/06/2018 Target Resolution Date: 03/08/2018 Goal Status: Active Interventions: Assess fall risk on admission and as needed Treatment Activities: Patient referred to home care : 02/06/2018 Notes: ` Necrotic Tissue Nursing Diagnoses: Impaired tissue integrity related to necrotic/devitalized tissue Goals: Necrotic/devitalized tissue will be minimized in the wound bed Date Initiated: 02/06/2018 Target Resolution Date: 03/08/2018 Goal Status: Active Interventions: Assess patient pain level pre-, during and post procedure and prior to discharge Treatment Activities: Excisional debridement : 02/06/2018 Notes: ` Nutrition Nursing Diagnoses: Potential for alteratiion in Nutrition/Potential for imbalanced nutrition TAOS, TAPP ([id number]) Goals: Patient/caregiver agrees to and verbalizes understanding of need to obtain nutritional consultation Date Initiated: 02/06/2018 Target Resolution Date: 03/08/2018 Goal Status: Active Interventions: Provide education on nutrition Notes: ` Orientation to the Wound Care Program Nursing Diagnoses: Knowledge deficit related to the wound healing center program Goals: Patient/caregiver will verbalize understanding of the [facility]  Program Date Initiated: 02/06/2018 Target Resolution Date: 03/08/2018 Goal Status: Active Interventions: Provide education on orientation to the wound center Notes: ` Pressure Nursing Diagnoses: Knowledge deficit related to management of pressures ulcers Goals: Patient/caregiver will verbalize understanding of pressure ulcer management Date Initiated: 02/06/2018 Target Resolution Date: 03/08/2018 Goal Status: Active Interventions: Assess offloading mechanisms upon admission and as needed Provide education on pressure ulcers Notes: ` Wound/Skin Impairment Nursing Diagnoses: Knowledge deficit related to ulceration/compromised skin integrity Goals: Ulcer/skin breakdown will have a volume reduction of 80% by week 12 Date Initiated: 02/06/2018 Target Resolution Date: 04/08/2018 Goal Status: Active ARIEZ, NEILAN ([id number]) Interventions: Assess ulceration(s) every visit Treatment Activities: Skin care regimen initiated : 02/06/2018 Topical wound management initiated : 02/06/2018 Notes: Electronic Signature(s) Signed: 02/07/2018 12:27:24 PM By: Gretta Cool, BSN, RN, CWS, Kim RN, BSN Entered By: Gretta Cool, BSN, RN, CWS, Kim on 02/06/2018 08:54:42 [name] ([id number]) -------------------------------------------------------------------------------- Non-Wound Condition Assessment Details Patient Name: [name], [name]. Date of Service: 02/06/2018 8:00 AM Medical Record Number: [id number] Patient Account Number: [id number] Date of Birth/Sex: 1931/04/16 (84 y.o. M) Treating RN: Cornell Barman Primary Care Tearia Gibbs: Burman Freestone Other Clinician: Referring Savon Cobbs: Vaughan Basta Treating Catera Hankins/Extender: STONE III, HOYT Weeks in Treatment: 0 Non-Wound Condition: Condition: Rash / Dermatitis Location: Other: Left shoulder/back Side: Left Photos Periwound Skin Texture Texture Color No Abnormalities Noted: No No Abnormalities Noted: No Callus: No Atrophie Blanche:  No Crepitus: No Cyanosis: No Excoriation: No Ecchymosis: No Friable: No Erythema: No Induration: No Hemosiderin Staining: No Rash: Yes Mottled: No Scarring: No Pallor:  No Rubor: Yes Moisture No Abnormalities Noted: No Dry / Scaly: Yes Maceration: No Notes skin felt warm Electronic Signature(s) Signed: 02/07/2018 12:27:24 PM By: Gretta Cool, BSN, RN, CWS, Kim RN, BSN Entered By: Gretta Cool, BSN, RN, CWS, Kim on 02/06/2018 17:07:32 [name] ([id number]) -------------------------------------------------------------------------------- Pain Assessment Details Patient Name: [name], [name]. Date of Service: 02/06/2018 8:00 AM Medical Record Number: [id number] Patient Account Number: [id number] Date of Birth/Sex: [birth year] (84 y.o. M) Treating RN: Cornell Barman Primary Care Brazil Voytko: Burman Freestone Other Clinician: Referring Mikail Goostree: Vaughan Basta Treating Caylor Cerino/Extender: [name], HOYT Weeks in Treatment: 0 Active Problems Location of Pain Severity and Description of Pain Patient Has Paino No Site Locations With Dressing Change: No Pain Management and Medication Current Pain Management: Goals for Pain Management Topical or injectable lidocaine is offered to patient for acute pain when surgical debridement is performed. If needed, Patient is instructed to use over the counter pain medication for the following 24-48 hours after debridement. Wound care MDs do not prescribed pain medications. Patient has chronic pain or uncontrolled pain. Patient has been instructed to make an appointment with their Primary Care Physician for pain management. Electronic Signature(s) Signed: 02/07/2018 12:27:24 PM By: Gretta Cool, BSN, RN, CWS, Kim RN, BSN Entered By: Gretta Cool, BSN, RN, CWS, Kim on 02/06/2018 08:13:40 [name] ([id number]) -------------------------------------------------------------------------------- Patient/Caregiver Education Details Patient Name: [name], [name] Date of Service: 02/06/2018 8:00 AM Medical Record Number: [id number] Patient Account Number: [id number] Date of Birth/Gender: 01-May-1931 (84 y.o. M) Treating RN: Cornell Barman Primary Care Physician: Burman Freestone Other Clinician: Referring Physician: Vaughan Basta Treating Physician/Extender: Sharalyn Ink in Treatment: 0 Education Assessment Education Provided To: Patient and Caregiver Education Topics Provided Pressure: Handouts: Pressure Ulcers: Care and Offloading, Other: keep pressure off of the wounded area Methods: Demonstration, Explain/Verbal Welcome To [facility]: Handouts: Welcome To [facility] Wound/Skin Impairment: Handouts: Caring for Your Ulcer, Other: dressing changes as prescribed Methods: Demonstration, Explain/Verbal Responses: State content correctly Electronic Signature(s) Signed: 02/07/2018 12:27:24 PM By: Gretta Cool, BSN, RN, CWS, Kim RN, BSN Entered By: Gretta Cool, BSN, RN, CWS, Kim on 02/06/2018 11:27:29 [name] ([id number]) -------------------------------------------------------------------------------- Wound Assessment Details Patient Name: [name], [name]. Date of Service: 02/06/2018 8:00 AM Medical Record Number: [id number] Patient Account Number: [id number] Date of Birth/Sex: [birth year] (84 y.o. M) Treating RN: Cornell Barman Primary Care Rashia Mckesson: Burman Freestone Other Clinician: Referring Daundre Biel: Vaughan Basta Treating Elea Holtzclaw/Extender: [name], HOYT Weeks in Treatment: 0 Wound Status Wound Number: 1 Primary Pressure Ulcer Etiology: Wound Location: Sacrum Wound Open Wounding Event: Pressure Injury Status: Date Acquired: 01/17/2018 Comorbid Cataracts, Asthma, Angina, Arrhythmia, Weeks Of Treatment: 0 History: Coronary Artery Disease, Hypertension, Clustered Wound: No Myocardial Infarction, Osteoarthritis, Dementia, Neuropathy Photos Photo Uploaded By: Gretta Cool, BSN, RN, CWS, Kim on  02/06/2018 17:05:38 Wound Measurements Length: (cm) 5.2 % Reducti Width: (cm) 5 % Reducti Depth: (cm) 2 Epithelia Area: (cm) 20.42 Tunnelin Volume: (cm) 40.841 Undermin Starti Ending Maximu on in Area: 0% on in Volume: 0% lization: None g: No ing: Yes ng Position (o'clock): 6 Position (o'clock): 4 m Distance: (cm) 2.4 Wound Description Classification: Unstageable/Unclassified Foul Odo Wound Margin: Distinct, outline attached Due to P Exudate Amount: Large Slough/F Exudate Type: Serous Exudate Color: amber r After Cleansing: Yes roduct Use: No ibrino Yes Wound Bed Granulation Amount: Small (1-33%) Exposed Structure Granulation Quality: Red Fascia Exposed: No Necrotic Amount: Large (67-100%) Fat Layer (Subcutaneous Tissue) Exposed: No Necrotic Quality: Adherent Slough Tendon Exposed: No Muscle Exposed: No Joint  Exposed: No [name], YELLOWHAIR ([id number]) Bone Exposed: No Periwound Skin Texture Texture Color No Abnormalities Noted: No No Abnormalities Noted: No Erythema: Yes Moisture Erythema Location: Circumferential No Abnormalities Noted: No Maceration: Yes Temperature / Pain Temperature: No Abnormality Tenderness on Palpation: Yes Wound Preparation Ulcer Cleansing: Rinsed/Irrigated with Saline Topical Anesthetic Applied: Other: lidocaine 4%, Treatment Notes Wound #1 (Sacrum) 1. Cleansed with: Clean wound with Normal Saline 2. Anesthetic Topical Lidocaine 4% cream to wound bed prior to debridement 3. Peri-wound Care: Skin Prep 4. Dressing Applied: Plain packing gauze Other dressing (specify in notes) 5. Secondary Dressing Applied ABD Pad Notes Vashe soaked gauze lightly packed into wound, covered with Drawtex and ABD secured with paper tape. Electronic Signature(s) Signed: 02/07/2018 12:27:24 PM By: Gretta Cool, BSN, RN, CWS, Kim RN, BSN Entered By: Gretta Cool, BSN, RN, CWS, Kim on 02/06/2018 08:57:18 [name]  ([id number]) -------------------------------------------------------------------------------- Vitals Details Patient Name: RIKI, GEHRING. Date of Service: 02/06/2018 8:00 AM Medical Record Number: [id number] Patient Account Number: [id number] Date of Birth/Sex: 07-Aug-1931 (84 y.o. M) Treating RN: Cornell Barman Primary Care Alianna Wurster: Burman Freestone Other Clinician: Referring Jordyn Doane: Vaughan Basta Treating Bernardine Langworthy/Extender: [name], HOYT Weeks in Treatment: 0 Vital Signs Time Taken: 08:12 Temperature (F): 98.4 Height (in): 69 Pulse (bpm): 62 Weight (lbs): 155 Respiratory Rate (breaths/min): 16 Body Mass Index (BMI): 22.9 Blood Pressure (mmHg): 138/56 Reference Range: 80 - 120 mg / dl Pulse Oximetry (%): 100 Electronic Signature(s) Signed: 02/07/2018 12:27:24 PM By: Gretta Cool, BSN, RN, CWS, Kim RN, BSN Entered By: Gretta Cool, BSN, RN, CWS, Kim on 02/06/2018 08:12:41

## 2018-02-13 ENCOUNTER — Encounter: Payer: Medicare Other | Admitting: Physician Assistant

## 2018-02-13 DIAGNOSIS — L89154 Pressure ulcer of sacral region, stage 4: Secondary | ICD-10-CM | POA: Diagnosis not present

## 2018-02-15 ENCOUNTER — Ambulatory Visit (INDEPENDENT_AMBULATORY_CARE_PROVIDER_SITE_OTHER): Payer: Medicare Other | Admitting: Physician Assistant

## 2018-02-15 ENCOUNTER — Encounter: Payer: Self-pay | Admitting: Physician Assistant

## 2018-02-15 VITALS — BP 120/68 | HR 72 | Ht 69.0 in | Wt 144.8 lb

## 2018-02-15 DIAGNOSIS — I480 Paroxysmal atrial fibrillation: Secondary | ICD-10-CM

## 2018-02-15 DIAGNOSIS — I951 Orthostatic hypotension: Secondary | ICD-10-CM

## 2018-02-15 DIAGNOSIS — I25119 Atherosclerotic heart disease of native coronary artery with unspecified angina pectoris: Secondary | ICD-10-CM | POA: Diagnosis not present

## 2018-02-15 DIAGNOSIS — E785 Hyperlipidemia, unspecified: Secondary | ICD-10-CM | POA: Diagnosis not present

## 2018-02-15 DIAGNOSIS — I10 Essential (primary) hypertension: Secondary | ICD-10-CM | POA: Diagnosis not present

## 2018-02-15 NOTE — Patient Instructions (Signed)
Medication Instructions:   Your physician recommends that you continue on your current medications as directed. Please refer to the Current Medication list given to you today.   If you need a refill on your cardiac medications before your next appointment, please call your pharmacy.  Labwork: NONE ORDERED  TODAY    Testing/Procedures:  NONE ORDERED  TODAY    Follow-Up:  IN 3 TO 4  MONTHS WITH  DR ROSS    Any Other Special Instructions Will Be Listed Below (If Applicable).

## 2018-02-15 NOTE — Progress Notes (Signed)
Cardiology Office Note    Date:  02/15/2018   ID:  [name], DOB [birth year], MRN [id number]  PCP:  [name] Faster, FNP  Cardiologist: Dr. Harrington Challenger  Chief Complaint: Hospital follow up   History of Present Illness:   [name] is a 84 y.o. male with a hx of CADs/p stent to the RCA and cutting balloon angioplasty to the diagonal,dementia,chronic chest pain, GI bleed felt to possibly be diverticular, orthostatic hypotension, hypertension,hyperlipidemia, GERD presents for hospital follow up.   [name] last underwent cath in 2015 that demonstrated patent RCA stent. There was 70-75% stenosis in the D1 that was not hemodynamically significant by FFR. He was admitted in 02/2014 with a GI bleed, felt to possibly be diverticular. He has known orthostatic hypotension, admitted in 07/2017 with presyncope and AKI in the setting of a 40 mmHg BP drop from lying to standing. He was most recently seen in the office on 12/26/2017 for follow up. He continued to note occasional episodes of chest discomfort that he had noted for years. He was noted to have been taken off Lasix since his 07/2017 and reported more swelling in his ankles. Compression stockings were advised given his issues with orthostasis.   Admitted 01/2018 for fall and prolonged downtime complicated by large sacral/back decubitus ulcer, rhabdomyolysis, and paroxysmal atrial fibrillation. Converted to sinus rhythm with episodic tachycardia.   Doing well since discharge. Living with daughter since discharge from rehab. Had intermittent chest pain. Described as sharp. Last underneath L axilla radiation to substernal area. Relives in few minutes. Ambulating well. No orthopnea, PND, syncope, LE edema or melena.      Past Medical History:  Diagnosis Date  . Arthritis    "hips, back" (06/17/2015)  . Asthma   . Chronic chest pain   . Chronic lower back pain   . Coronary artery disease    a. s/p BMS to RCA in 2002; b. s/p cutting  balloon POBA ;   c. cath 6/12: oDx 80% (treated with repeat cutting balloon POBA), mLAD 50% with 30-40% at Dx, CFX 30%, pRCA 25% with patent stents;  d.  Lex MV 4/14:  Low Risk - EF 61%, inf scar with peri-infarct ischemia  . Dyspnea    chronic  . GERD (gastroesophageal reflux disease)    h/o esophageal spasm  . Headache   . History of blood transfusion    "related to OR"  . History of hiatal hernia   . Hyperlipidemia   . Hypertension   . Melanoma of lower back ([place]) late 1990's  . Memory loss   . Myocardial infarction ([place]) 2001   x 1, confirned 1 possible    Past Surgical History:  Procedure Laterality Date  . ANTERIOR CERVICAL DECOMP/DISCECTOMY FUSION    . BACK SURGERY  multiple  . CARDIAC CATHETERIZATION     "I've had 17 caths" (06/17/2015)  . CATARACT EXTRACTION W/ INTRAOCULAR LENS  IMPLANT, BILATERAL Bilateral   . CERVICAL DISC SURGERY  multiple  . COLONOSCOPY WITH PROPOFOL N/A 04/17/2014   Procedure: COLONOSCOPY WITH PROPOFOL;  Surgeon: Winfield Cunas., MD;  Location: [facility] ENDOSCOPY;  Service: Endoscopy;  Laterality: N/A;  . CORONARY ANGIOPLASTY WITH STENT PLACEMENT  x 2 stents    previous percutaneous intervention on the  RCA and the diagonal branch  . ESOPHAGOGASTRODUODENOSCOPY  03/23/2012   Procedure: ESOPHAGOGASTRODUODENOSCOPY (EGD);  Surgeon: Winfield Cunas., MD;  Location: [facility];  Service: Endoscopy;  Laterality: N/A;  . ESOPHAGOGASTRODUODENOSCOPY  N/A 03/04/2014   Procedure: ESOPHAGOGASTRODUODENOSCOPY (EGD);  Surgeon: Arta Silence, MD;  Location: [facility] ENDOSCOPY;  Service: Endoscopy;  Laterality: N/A;  . LAMINECTOMY    . LEFT HEART CATHETERIZATION WITH CORONARY ANGIOGRAM N/A 01/13/2014   Procedure: LEFT HEART CATHETERIZATION WITH CORONARY ANGIOGRAM;  Surgeon: Blane Ohara, MD;  Location: [facility] CATH LAB;  Service: Cardiovascular;  Laterality: N/A;  . MELANOMA EXCISION  late 1990's   "lower back"  . POSTERIOR LAMINECTOMY / DECOMPRESSION CERVICAL SPINE    .  SAVORY DILATION  03/23/2012   Procedure: SAVORY DILATION;  Surgeon: Winfield Cunas., MD;  Location: [facility] ENDOSCOPY;  Service: Endoscopy;  Laterality: N/A;  . TONSILLECTOMY  1930's    Current Medications: Prior to Admission medications   Medication Sig Start Date End Date Taking? Authorizing Provider  acetaminophen (TYLENOL) 325 MG tablet Take 2 tablets (650 mg total) by mouth every 4 (four) hours as needed for headache or mild pain. 12/16/13   Erlene Quan, PA-C  amiodarone (PACERONE) 200 MG tablet Take 1 tablet (200 mg total) by mouth 2 (two) times daily. 01/24/18   Bettey Costa, MD  aspirin EC 81 MG tablet Take 1 tablet (81 mg total) by mouth daily. 03/08/14   Mikhail, Velta Addison, DO  atorvastatin (LIPITOR) 80 MG tablet Take 1 tablet (80 mg total) by mouth daily. 12/27/17 03/27/18  Richardson Dopp T, PA-C  collagenase (SANTYL) ointment Apply topically 2 (two) times daily. 01/24/18   Bettey Costa, MD  cyclobenzaprine (FLEXERIL) 5 MG tablet Take 7.5 mg by mouth 3 (three) times daily as needed for muscle spasms.     [provider]  doxazosin (CARDURA) 1 MG tablet TAKE 1 TABLET BY MOUTH ONCE DAILY. 06/01/17   Fay Records, MD  feeding supplement, ENSURE ENLIVE, (ENSURE ENLIVE) LIQD Take 237 mLs by mouth 2 (two) times daily between meals. 08/02/17   Fritzi Mandes, MD  feeding supplement, ENSURE ENLIVE, (ENSURE ENLIVE) LIQD Take 237 mLs by mouth 2 (two) times daily between meals. 01/24/18   Bettey Costa, MD  HYDROcodone-acetaminophen (NORCO) 7.5-325 MG tablet Take 1 tablet by mouth every 4 (four) hours as needed for moderate pain. FOR PAIN 01/24/18   Gladstone Lighter, MD  pantoprazole (PROTONIX) 40 MG tablet TAKE 1 TABLET BY MOUTH TWICE DAILY 06/01/17   Fay Records, MD  tamsulosin (FLOMAX) 0.4 MG CAPS capsule Take 0.4 mg by mouth daily.  01/13/15   [provider]  vitamin B-12 1000 MCG tablet Take 1 tablet (1,000 mcg total) by mouth daily. 06/21/17   Geradine Girt, DO    Allergies:    Budesonide-formoterol fumarate; Iohexol; Simvastatin; Demerol [meperidine]; Ivp dye [iodinated diagnostic agents]; Meperidine hcl; Tape; Naproxen; Pregabalin; and Sulfonamide derivatives   Social History   Socioeconomic History  . Marital status: Widowed    Spouse name: Not on file  . Number of children: 5  . Years of education: 83  . Highest education level: Not on file  Occupational History  . Occupation: retired    Fish farm manager: RETIRED    Comment: Teacher, English as a foreign language  . Financial resource strain: Not on file  . Food insecurity:    Worry: Not on file    Inability: Not on file  . Transportation needs:    Medical: Not on file    Non-medical: Not on file  Tobacco Use  . Smoking status: Never Smoker  . Smokeless tobacco: Never Used  Substance and Sexual Activity  . Alcohol use: No  . Drug  use: No  . Sexual activity: Never    Birth control/protection: None  Lifestyle  . Physical activity:    Days per week: Not on file    Minutes per session: Not on file  . Stress: Not on file  Relationships  . Social connections:    Talks on phone: Not on file    Gets together: Not on file    Attends religious service: Not on file    Active member of club or organization: Not on file    Attends meetings of clubs or organizations: Not on file    Relationship status: Not on file  Other Topics Concern  . Not on file  Social History Narrative   Lives in [place]. Generaly active around the house and walks his dog without chest pain or SOB.   Right-handed.   Lives alone.   No caffeine use.     Family History:  The patient's family history includes Asthma in his father; Colitis in his son; Colon cancer in his son; Crohn's disease in his son; Diabetes in his father; Heart disease in his father and mother; Prostate cancer in his paternal grandfather.   ROS:   Please see the history of present illness.    ROS All other systems reviewed and are negative.   PHYSICAL EXAM:     VS:  BP 120/68   Pulse 72   Ht 5\' 9"  (1.753 m)   Wt 144 lb 12.8 oz (65.7 kg)   BMI 21.38 kg/m    GEN: Thin frail male  in no acute distress here with 2 daughters HEENT: normal  Neck: no JVD, carotid bruits, or masses Cardiac: RRR; no murmurs, rubs, or gallops,no edema  Respiratory:  clear to auscultation bilaterally, normal work of breathing GI: soft, nontender, nondistended, + BS MS: no deformity or atrophy  Skin: warm and dry, no rash Neuro:  Alert and Oriented x 3, Strength and sensation are intact Psych: euthymic mood, full affect  Wt Readings from Last 3 Encounters:  02/15/18 144 lb 12.8 oz (65.7 kg)  01/26/18 147 lb (66.7 kg)  12/26/17 154 lb (69.9 kg)      Studies/Labs Reviewed:   EKG:  EKG is  Not ordered today.    Recent Labs: 01/20/2018: ALT 36 01/21/2018: Magnesium 2.2; TSH 1.834 01/26/2018: BUN 18; Creatinine, Ser 0.80; Hemoglobin 9.9; Platelets 255; Potassium 3.9; Sodium 137   Lipid Panel    Component Value Date/Time   CHOL 192 12/26/2017 1335   TRIG 93 12/26/2017 1335   HDL 43 12/26/2017 1335   CHOLHDL 4.5 12/26/2017 1335   CHOLHDL 3 01/19/2015 1315   VLDL 14.2 01/19/2015 1315   LDLCALC 130 (H) 12/26/2017 1335    Additional studies/ records that were reviewed today include:   Echocardiogram: 01/23/18 Study Conclusions  - Procedure narrative: Transthoracic echocardiography. Image   quality was suboptimal. The study was technically difficult, as a   result of poor acoustic windows, poor patient compliance, and   restricted patient mobility. - Left ventricle: The cavity size was normal. Wall thickness was   normal. Systolic function was normal. The estimated ejection   fraction was in the range of 55% to 65%. - Aortic valve: A bicuspid morphology cannot be excluded; mildly   thickened, mildly calcified leaflets. Transvalvular velocity was   within the normal range. There was no stenosis. There was no   significant regurgitation. - Right ventricle:  The cavity size was normal. Systolic function   was normal. - Pulmonary arteries:  Systolic pressure was at least mildly   increased, estimated to be 35 mm Hg plus central venous pressure.  Nuclear stress test1/29/17 IMPRESSION: 1. No reversible ischemia or infarction. 2. Normal left ventricular wall motion. 3. Left ventricular ejection fraction 55% 4. Intermediate-risk stress test findings*.  Cardiac Catheterization3/30/15 LAD mid 40-50, ostial D1 70-75 (FFR normal) LCx irregs RCA mid stent patent with 30-40 ISR   ASSESSMENT & PLAN:    1. Paroxysmal atrial fibrillation - In setting of down on the floor  for unknown amount of time and rhabdomyolysis. Treated with heparin during admission but he is not a good long-term candidate for anticoagulation. Continue Amiodarone. Sinus by exam.   2. CAD - Intermittent atypical type chest pain. Not similar to prior angina. Continue ASA and statin. No BB given orthostatics.   3. Orthostatic hypotension in setting of essential hypertension - not on any antihypertensive medications. BP and HR stable today.   4. HLD - Continue statin    Medication Adjustments/Labs and Tests Ordered: Current medicines are reviewed at length with the patient today.  Concerns regarding medicines are outlined above.  Medication changes, Labs and Tests ordered today are listed in the Patient Instructions below. Patient Instructions  Medication Instructions:   Your physician recommends that you continue on your current medications as directed. Please refer to the Current Medication list given to you today.   If you need a refill on your cardiac medications before your next appointment, please call your pharmacy.  Labwork: NONE ORDERED  TODAY    Testing/Procedures:  NONE ORDERED  TODAY    Follow-Up:  IN 3 TO 4  MONTHS WITH  DR ROSS    Any Other Special Instructions Will Be Listed Below (If Applicable).                                                                                                                                                       Jarrett Soho, [place]  02/15/2018 9:58 AM    [facility] [address], [place], [place]  [address] Phone: [phone]; Fax: [phone]

## 2018-02-17 NOTE — Progress Notes (Signed)
EMERSEN, CARROLL ([id number]) Visit Report for 02/13/2018 Arrival Information Details Patient Name: [name], [name]. Date of Service: 02/13/2018 9:00 AM Medical Record Number: [id number] Patient Account Number: [id number] Date of Birth/Sex: 1931/04/30 (84 y.o. Male) Treating RN: Montey Hora Primary Care Lashawnna Lambrecht: Burman Freestone Other Clinician: Referring Aliahna Statzer: Burman Freestone Treating Sewell Pitner/Extender: [name], HOYT Weeks in Treatment: 1 Visit Information History Since Last Visit Added or deleted any medications: No Patient Arrived: Cane Any new allergies or adverse reactions: No Arrival Time: 09:05 Had a fall or experienced change in No Accompanied By: spouse activities of daily living that may affect Transfer Assistance: None risk of falls: Patient Identification Verified: Yes Signs or symptoms of abuse/neglect since last visito No Secondary Verification Process Completed: Yes Hospitalized since last visit: No Patient Requires Transmission-Based Precautions: No Implantable device outside of the clinic excluding No Patient Has Alerts: Yes cellular tissue based products placed in the center since last visit: Has Dressing in Place as Prescribed: Yes Pain Present Now: No Electronic Signature(s) Signed: 02/13/2018 3:57:25 PM By: Montey Hora Entered By: Montey Hora on 02/13/2018 09:07:16 [name] ([id number]) -------------------------------------------------------------------------------- Encounter Discharge Information Details Patient Name: [name] Date of Service: 02/13/2018 9:00 AM Medical Record Number: [id number] Patient Account Number: [id number] Date of Birth/Sex: 1931-09-08 (84 y.o. Male) Treating RN: Roger Shelter Primary Care Moo Gravley: Burman Freestone Other Clinician: Referring Antoino Westhoff: Burman Freestone Treating Gavino Fouch/Extender: [name], HOYT Weeks in Treatment: 1 Encounter Discharge Information Items Discharge Pain Level:  0 Discharge Condition: Stable Ambulatory Status: Cane Discharge Destination: Home Private Transportation: Auto Accompanied By: daughters Schedule Follow-up Appointment: Yes Medication Reconciliation completed and provided No to Patient/Care Andon Villard: Clinical Summary of Care: Electronic Signature(s) Signed: 02/13/2018 3:57:53 PM By: Roger Shelter Entered By: Roger Shelter on 02/13/2018 09:53:26 [name] ([id number]) -------------------------------------------------------------------------------- Lower Extremity Assessment Details Patient Name: [name] Date of Service: 02/13/2018 9:00 AM Medical Record Number: [id number] Patient Account Number: [id number] Date of Birth/Sex: September 06, 1931 (84 y.o. Male) Treating RN: Montey Hora Primary Care Marguis Mathieson: Burman Freestone Other Clinician: Referring Zamaya Rapaport: Burman Freestone Treating Shareen Capwell/Extender: [name], HOYT Weeks in Treatment: 1 Electronic Signature(s) Signed: 02/13/2018 3:57:25 PM By: Montey Hora Entered By: Montey Hora on 02/13/2018 09:15:42 [name] ([id number]) -------------------------------------------------------------------------------- Multi Wound Chart Details Patient Name: [name] Date of Service: 02/13/2018 9:00 AM Medical Record Number: [id number] Patient Account Number: [id number] Date of Birth/Sex: 05-01-1931 (84 y.o. Male) Treating RN: Carolyne Fiscal, Debi Primary Care Cricket Goodlin: Burman Freestone Other Clinician: Referring Gunda Maqueda: Burman Freestone Treating Braley Luckenbaugh/Extender: STONE III, HOYT Weeks in Treatment: 1 Vital Signs Height(in): 69 Pulse(bpm): 70 Weight(lbs): 155 Blood Pressure(mmHg): 144/63 Body Mass Index(BMI): 23 Temperature(F): 98.2 Respiratory Rate 16 (breaths/min): Photos: [1:No Photos] [N/A:N/A] Wound Location: [1:Sacrum] [N/A:N/A] Wounding Event: [1:Pressure Injury] [N/A:N/A] Primary Etiology: [1:Pressure Ulcer] [N/A:N/A] Comorbid  History: [1:Cataracts, Asthma, Angina, Arrhythmia, Coronary Artery Disease, Hypertension, Myocardial Infarction, Osteoarthritis, Dementia, Neuropathy] [N/A:N/A] Date Acquired: [1:01/17/2018] [N/A:N/A] Weeks of Treatment: [1:1] [N/A:N/A] Wound Status: [1:Open] [N/A:N/A] Measurements L x W x D [1:5.1x4.9x1.7] [N/A:N/A] (cm) Area (cm) : [1:19.627] [N/A:N/A] Volume (cm) : [1:33.366] [N/A:N/A] % Reduction in Area: [1:3.90%] [N/A:N/A] % Reduction in Volume: [1:18.30%] [N/A:N/A] Starting Position 1 [1:1] (o'clock): Ending Position 1 [1:1] (o'clock): Maximum Distance 1 (cm): [1:2.5] Undermining: [1:Yes] [N/A:N/A] Classification: [1:Category/Stage III] [N/A:N/A] Exudate Amount: [1:Large] [N/A:N/A] Exudate Type: [1:Serous] [N/A:N/A] Exudate Color: [1:amber] [N/A:N/A] Foul Odor After Cleansing: [1:Yes] [N/A:N/A] Odor Anticipated Due to [1:No] [N/A:N/A] Product Use: Wound Margin: [1:Distinct, outline attached] [N/A:N/A] Granulation Amount: [1:Large (67-100%)] [N/A:N/A] Granulation Quality: [1:Red,  Hyper-granulation] [N/A:N/A] Necrotic Amount: [1:Small (1-33%)] [N/A:N/A] Exposed Structures: [N/A:N/A] Fat Layer (Subcutaneous Tissue) Exposed: Yes Fascia: No Tendon: No Muscle: No Joint: No Bone: No Epithelialization: None N/A N/A Periwound Skin Texture: No Abnormalities Noted N/A N/A Periwound Skin Moisture: Maceration: Yes N/A N/A Periwound Skin Color: Erythema: Yes N/A N/A Erythema Location: Circumferential N/A N/A Temperature: No Abnormality N/A N/A Tenderness on Palpation: Yes N/A N/A Wound Preparation: Ulcer Cleansing: N/A N/A Rinsed/Irrigated with Saline Topical Anesthetic Applied: Other: lidocaine 4% Treatment Notes Electronic Signature(s) Signed: 02/14/2018 4:27:21 PM By: Alric Quan Entered By: Alric Quan on 02/13/2018 09:32:09 [name] ([id number]) -------------------------------------------------------------------------------- Reform Details Patient Name: [name], [name]. Date of Service: 02/13/2018 9:00 AM Medical Record Number: [id number] Patient Account Number: [id number] Date of Birth/Sex: [birth year] (84 y.o. Male) Treating RN: Carolyne Fiscal, Debi Primary Care Gali Spinney: Burman Freestone Other Clinician: Referring Eashan Schipani: Burman Freestone Treating Hogan Hoobler/Extender: [name], HOYT Weeks in Treatment: 1 Active Inactive ` Abuse / Safety / Falls / Self Care Management Nursing Diagnoses: History of Falls Goals: Patient will not experience any injury related to falls Date Initiated: 02/06/2018 Target Resolution Date: 03/08/2018 Goal Status: Active Interventions: Assess fall risk on admission and as needed Treatment Activities: Patient referred to home care : 02/06/2018 Notes: ` Necrotic Tissue Nursing Diagnoses: Impaired tissue integrity related to necrotic/devitalized tissue Goals: Necrotic/devitalized tissue will be minimized in the wound bed Date Initiated: 02/06/2018 Target Resolution Date: 03/08/2018 Goal Status: Active Interventions: Assess patient pain level pre-, during and post procedure and prior to discharge Treatment Activities: Excisional debridement : 02/06/2018 Notes: ` Nutrition Nursing Diagnoses: Potential for alteratiion in Nutrition/Potential for imbalanced nutrition [name], [name] ([id number]) Goals: Patient/caregiver agrees to and verbalizes understanding of need to obtain nutritional consultation Date Initiated: 02/06/2018 Target Resolution Date: 03/08/2018 Goal Status: Active Interventions: Provide education on nutrition Notes: ` Orientation to the Wound Care Program Nursing Diagnoses: Knowledge deficit related to the wound healing center program Goals: Patient/caregiver will verbalize understanding of the [facility] Date Initiated: 02/06/2018 Target Resolution Date: 03/08/2018 Goal Status: Active Interventions: Provide education on orientation to  the wound center Notes: ` Pressure Nursing Diagnoses: Knowledge deficit related to management of pressures ulcers Goals: Patient/caregiver will verbalize understanding of pressure ulcer management Date Initiated: 02/06/2018 Target Resolution Date: 03/08/2018 Goal Status: Active Interventions: Assess offloading mechanisms upon admission and as needed Provide education on pressure ulcers Notes: ` Wound/Skin Impairment Nursing Diagnoses: Knowledge deficit related to ulceration/compromised skin integrity Goals: Ulcer/skin breakdown will have a volume reduction of 80% by week 12 Date Initiated: 02/06/2018 Target Resolution Date: 04/08/2018 Goal Status: Active [name], [name] ([id number]) Interventions: Assess ulceration(s) every visit Treatment Activities: Skin care regimen initiated : 02/06/2018 Topical wound management initiated : 02/06/2018 Notes: Electronic Signature(s) Signed: 02/14/2018 4:27:21 PM By: Alric Quan Entered By: Alric Quan on 02/13/2018 09:31:46 [name] ([id number]) -------------------------------------------------------------------------------- Pain Assessment Details Patient Name: [name] Date of Service: 02/13/2018 9:00 AM Medical Record Number: [id number] Patient Account Number: [id number] Date of Birth/Sex: 27-Jan-1931 (84 y.o. Male) Treating RN: Montey Hora Primary Care Britney Captain: Burman Freestone Other Clinician: Referring Pheonix Wisby: Burman Freestone Treating Daequan Kozma/Extender: [name], HOYT Weeks in Treatment: 1 Active Problems Location of Pain Severity and Description of Pain Patient Has Paino No Site Locations Pain Management and Medication Current Pain Management: Electronic Signature(s) Signed: 02/13/2018 3:57:25 PM By: Montey Hora Entered By: Montey Hora on 02/13/2018 09:07:46 [name] ([id number]) -------------------------------------------------------------------------------- Patient/Caregiver  Education Details Patient Name: [name] Date of Service:  02/13/2018 9:00 AM Medical Record Number: [id number] Patient Account Number: [id number] Date of Birth/Gender: [birth year] (84 y.o. Male) Treating RN: Roger Shelter Primary Care Physician: Burman Freestone Other Clinician: Referring Physician: Burman Freestone Treating Physician/Extender: Sharalyn Ink in Treatment: 1 Education Assessment Education Provided To: Patient Education Topics Provided Wound Debridement: Handouts: Wound Debridement Methods: Explain/Verbal Responses: State content correctly Wound/Skin Impairment: Handouts: Caring for Your Ulcer Methods: Explain/Verbal Responses: State content correctly Electronic Signature(s) Signed: 02/13/2018 3:57:53 PM By: Roger Shelter Entered By: Roger Shelter on 02/13/2018 09:53:45 [name] ([id number]) -------------------------------------------------------------------------------- Wound Assessment Details Patient Name: [name] Date of Service: 02/13/2018 9:00 AM Medical Record Number: [id number] Patient Account Number: [id number] Date of Birth/Sex: 1930/11/30 (84 y.o. Male) Treating RN: Montey Hora Primary Care Josaphine Shimamoto: Burman Freestone Other Clinician: Referring Ciera Beckum: Burman Freestone Treating Giovanni Biby/Extender: [name], HOYT Weeks in Treatment: 1 Wound Status Wound Number: 1 Primary Pressure Ulcer Etiology: Wound Location: Sacrum Wound Open Wounding Event: Pressure Injury Status: Date Acquired: 01/17/2018 Comorbid Cataracts, Asthma, Angina, Arrhythmia, Weeks Of Treatment: 1 History: Coronary Artery Disease, Hypertension, Clustered Wound: No Myocardial Infarction, Osteoarthritis, Dementia, Neuropathy Photos Photo Uploaded By: Montey Hora on 02/13/2018 12:53:20 Wound Measurements Length: (cm) 5.1 Width: (cm) 4.9 Depth: (cm) 1.7 Area: (cm) 19.627 Volume: (cm) 33.366 % Reduction in Area: 3.9% % Reduction  in Volume: 18.3% Epithelialization: None Tunneling: No Undermining: Yes Starting Position (o'clock): 1 Ending Position (o'clock): 1 Maximum Distance: (cm) 2.5 Wound Description Classification: Category/Stage III Foul O Wound Margin: Distinct, outline attached Due to Exudate Amount: Large Slough Exudate Type: Serous Exudate Color: amber dor After Cleansing: Yes Product Use: No /Fibrino Yes Wound Bed Granulation Amount: Large (67-100%) Exposed Structure Granulation Quality: Red, Hyper-granulation Fascia Exposed: No Necrotic Amount: Small (1-33%) Fat Layer (Subcutaneous Tissue) Exposed: Yes Necrotic Quality: Adherent Slough Tendon Exposed: No [name], [name] ([id number]) Muscle Exposed: No Joint Exposed: No Bone Exposed: No Periwound Skin Texture Texture Color No Abnormalities Noted: No No Abnormalities Noted: No Erythema: Yes Moisture Erythema Location: Circumferential No Abnormalities Noted: No Maceration: Yes Temperature / Pain Temperature: No Abnormality Tenderness on Palpation: Yes Wound Preparation Ulcer Cleansing: Rinsed/Irrigated with Saline Topical Anesthetic Applied: Other: lidocaine 4%, Treatment Notes Wound #1 (Sacrum) 1. Cleansed with: Clean wound with Normal Saline 2. Anesthetic Topical Lidocaine 4% cream to wound bed prior to debridement 4. Dressing Applied: Other dressing (specify in notes) Notes Dakins soaked gauze lightly packed into wound, covered with Drawtex and ABD secured with paper tape. Electronic Signature(s) Signed: 02/13/2018 3:57:25 PM By: Montey Hora Entered By: Montey Hora on 02/13/2018 09:15:27 [name] ([id number]) -------------------------------------------------------------------------------- Vitals Details Patient Name: [name] Date of Service: 02/13/2018 9:00 AM Medical Record Number: [id number] Patient Account Number: [id number] Date of Birth/Sex: 24-Aug-1931 (84 y.o. Male) Treating RN: Montey Hora Primary Care Lizzie Cokley: Burman Freestone Other Clinician: Referring Tayna Smethurst: Burman Freestone Treating Graycie Halley/Extender: [name], HOYT Weeks in Treatment: 1 Vital Signs Time Taken: 09:07 Temperature (F): 98.2 Height (in): 69 Pulse (bpm): 70 Weight (lbs): 155 Respiratory Rate (breaths/min): 16 Body Mass Index (BMI): 22.9 Blood Pressure (mmHg): 144/63 Reference Range: 80 - 120 mg / dl Electronic Signature(s) Signed: 02/13/2018 3:57:25 PM By: Montey Hora Entered By: Montey Hora on 02/13/2018 09:08:01

## 2018-02-17 NOTE — Progress Notes (Signed)
HOBY, KAWAI ([id number]) Visit Report for 02/13/2018 Chief Complaint Document Details Patient Name: [name], [name]. Date of Service: 02/13/2018 9:00 AM Medical Record Number: [id number] Patient Account Number: [id number] Date of Birth/Sex: [birth year] (84 y.o. Male) Treating RN: Ahmed Prima Primary Care Provider: Burman Freestone Other Clinician: Referring Provider: Burman Freestone Treating Provider/Extender: [name], Evania Lyne Weeks in Treatment: 1 Information Obtained from: Patient Chief Complaint Sacral pressure ulcer and left shoulder rash Electronic Signature(s) Signed: 02/13/2018 5:03:20 PM By: Worthy Keeler PA-C Entered By: Worthy Keeler on 02/13/2018 09:26:05 [name] ([id number]) -------------------------------------------------------------------------------- Debridement Details Patient Name: [name] Date of Service: 02/13/2018 9:00 AM Medical Record Number: [id number] Patient Account Number: [id number] Date of Birth/Sex: 06/26/1931 (84 y.o. Male) Treating RN: Ahmed Prima Primary Care Provider: Burman Freestone Other Clinician: Referring Provider: Burman Freestone Treating Provider/Extender: [name], Karene Bracken Weeks in Treatment: 1 Debridement Performed for Wound #1 Sacrum Assessment: Performed By: Physician STONE III, Franke Menter E., PA-C Debridement Type: Debridement Pre-procedure Verification/Time Yes - 09:41 Out Taken: Start Time: 09:41 Pain Control: Lidocaine 4% Topical Solution Total Area Debrided (L x W): 5.1 (cm) x 4.9 (cm) = 24.99 (cm) Tissue and other material Viable, Non-Viable, Slough, Subcutaneous, Fibrin/Exudate, Slough debrided: Level: Skin/Subcutaneous Tissue Debridement Description: Excisional Instrument: Curette Bleeding: Minimum Hemostasis Achieved: Pressure End Time: 09:45 Procedural Pain: 0 Post Procedural Pain: 0 Response to Treatment: Procedure was tolerated well Post Debridement Measurements of Total  Wound Length: (cm) 5.1 Stage: Category/Stage III Width: (cm) 4.9 Depth: (cm) 1.8 Volume: (cm) 35.329 Character of Wound/Ulcer Post Requires Further Debridement Debridement: Post Procedure Diagnosis Same as Pre-procedure Electronic Signature(s) Signed: 02/13/2018 5:03:20 PM By: Worthy Keeler PA-C Signed: 02/14/2018 4:27:21 PM By: Alric Quan Entered By: Alric Quan on 02/13/2018 09:45:21 [name] ([id number]) -------------------------------------------------------------------------------- HPI Details Patient Name: [name] Date of Service: 02/13/2018 9:00 AM Medical Record Number: [id number] Patient Account Number: [id number] Date of Birth/Sex: [birth year] (84 y.o. Male) Treating RN: Ahmed Prima Primary Care Provider: Burman Freestone Other Clinician: Referring Provider: Burman Freestone Treating Provider/Extender: [name], Sheketa Ende Weeks in Treatment: 1 History of Present Illness HPI Description: 02/06/18 on evaluation today patient presents for initial evaluation and our clinic concerning an issue which began roughly 3 weeks ago when the patient fell in his home on the floor in his kitchen and laid him down this detergent for roughly 3 days. He had a pressure injury to the left shoulder. This unfortunately has caused him a lot of discomfort although it finally seems to be doing better if anything is really having a lot of itching right now. This appears potentially be a contact dermatitis issue. He also has a significant pressure injury to the sacrum at this time as well which is also showing fascia exposure right over the bone but no evidence of bone exposure at this point which is good news. They have been using Santyl as well as Saline soaked gauze at this point in time. There does appear to be a lot of necrotic slough in the base of the wound. He does have a history of incontinence, myocardial infarction, and hypertension. He also is "borderline  diabetic" hemoglobin A1c of 6.0. Currently he has some discomfort in the pressure site at the sacrum but fortunately nothing too significant this did require sharp debridement today. 02/13/18 on evaluation today patient appears to be doing much better in regard to his sacral wound. He has been tolerating the dressing changes without complication with the Vashe. Fortunately there is no  evidence of infection and though there is some Slough on the surface of the wound bed he has excellent granulation noted. Overall I'm pleased with how things have progressed in that regard. A glance at his shoulder as well and the rash seems to be someone improving in my pinion at this site as well. Overall I am pleased with what we're seeing and so is the family. Electronic Signature(s) Signed: 02/13/2018 5:03:20 PM By: Worthy Keeler PA-C Entered By: Worthy Keeler on 02/13/2018 09:49:22 [name] ([id number]) -------------------------------------------------------------------------------- Physical Exam Details Patient Name: [name] Date of Service: 02/13/2018 9:00 AM Medical Record Number: [id number] Patient Account Number: [id number] Date of Birth/Sex: 11-17-1930 (84 y.o. Male) Treating RN: Carolyne Fiscal, Debi Primary Care Provider: Burman Freestone Other Clinician: Referring Provider: Burman Freestone Treating Provider/Extender: STONE III, Delayla Hoffmaster Weeks in Treatment: 1 Constitutional Well-nourished and well-hydrated in no acute distress. Respiratory normal breathing without difficulty. Psychiatric this patient is able to make decisions and demonstrates good insight into disease process. Alert and Oriented x 3. pleasant and cooperative. Notes Patient's wound to have some Slough noted over the surface of the sacral wound which did require some debridement today although this was minimal. Post debridement the wound bed appears to be doing much better I think that after one more week of treating  this with the Vashe patient may be ready for progression to the Wound VAC. Electronic Signature(s) Signed: 02/13/2018 5:03:20 PM By: Worthy Keeler PA-C Entered By: Worthy Keeler on 02/13/2018 09:50:04 [name] ([id number]) -------------------------------------------------------------------------------- Physician Orders Details Patient Name: MIN, COLLYMORE Date of Service: 02/13/2018 9:00 AM Medical Record Number: [id number] Patient Account Number: [id number] Date of Birth/Sex: September 17, 1931 (84 y.o. Male) Treating RN: Carolyne Fiscal, Debi Primary Care Provider: Burman Freestone Other Clinician: Referring Provider: Burman Freestone Treating Provider/Extender: [name], Alani Lacivita Weeks in Treatment: 1 Verbal / Phone Orders: Yes Clinician: Pinkerton, Debi Read Back and Verified: Yes Diagnosis Coding ICD-10 Coding Code Description L89.154 Pressure ulcer of sacral region, stage 4 L24.0 Irritant contact dermatitis due to detergents Wound Cleansing Wound #1 Sacrum o Clean wound with Normal Saline. Anesthetic (add to Medication List) Wound #1 Sacrum o Topical Lidocaine 4% cream applied to wound bed prior to debridement (In Clinic Only). Skin Barriers/Peri-Wound Care Wound #1 Sacrum o Barrier cream Primary Wound Dressing Wound #1 Sacrum o Dry Gauze - soaked in Vashe or Dakins and packed into wound Secondary Dressing Wound #1 Sacrum o ABD pad o Other - Drawtex or equal Dressing Change Frequency Wound #1 Sacrum o Change dressing every day. Follow-up Appointments Wound #1 Sacrum o Return Appointment in 1 week. Off-Loading Wound #1 Sacrum o Turn and reposition every 2 hours Home Health Wound #1 VINT, POLA ([id number]) o [place] Visits - [place] Nurse may visit PRN to address patientos wound care needs. o FACE TO FACE ENCOUNTER: MEDICARE and MEDICAID PATIENTS: I certify that this patient is under my care and that  I had a face-to-face encounter that meets the physician face-to-face encounter requirements with this patient on this date. The encounter with the patient was in whole or in part for the following MEDICAL CONDITION: (primary reason for [place]) MEDICAL NECESSITY: I certify, that based on my findings, NURSING services are a medically necessary home health service. HOME BOUND STATUS: I certify that my clinical findings support that this patient is homebound (i.e., Due to illness or injury, pt requires aid of supportive devices such as crutches, cane, wheelchairs,  walkers, the use of special transportation or the assistance of another person to leave their place of residence. There is a normal inability to leave the home and doing so requires considerable and taxing effort. Other absences are for medical reasons / religious services and are infrequent or of short duration when for other reasons). o If current dressing causes regression in wound condition, may D/C ordered dressing product/s and apply Normal Saline Moist Dressing daily until next [place] / Other MD appointment. [place] of regression in wound condition at [phone]. o Please direct any NON-WOUND related issues/requests for orders to patient's Primary Care Physician Medications-please add to medication list. Wound #1 Sacrum o Other: - TAC on back and shoulder Patient Medications Allergies: Lyrica, gabapentin, Sulfa (Sulfonamide Antibiotics), contrast dye, budesonide, iohexol, simvastatin, Demerol, meperidine, adhesive tape, naproxen, pregabalin Notifications Medication Indication Start End lidocaine DOSE 1 - topical 4 % cream - 1 cream topical Electronic Signature(s) Signed: 02/13/2018 5:03:20 PM By: Worthy Keeler PA-C Signed: 02/14/2018 4:27:21 PM By: Alric Quan Entered By: Alric Quan on 02/13/2018 09:37:00 LODEN, LAURENT  ([id number]) -------------------------------------------------------------------------------- Prescription 02/13/2018 Patient Name: [name] Provider: Worthy Keeler PA-C Date of Birth: 06-19-1931 NPI#: [id number] Sex: Male DEA#: [id number] Phone #: [phone] License #: Patient Address: [facility] [place], [place] [address] [address] [place], [place] [address] [phone] Allergies Lyrica gabapentin Sulfa (Sulfonamide Antibiotics) contrast dye budesonide iohexol simvastatin Demerol meperidine adhesive tape naproxen pregabalin Medication Medication: Route: Strength: Form: lidocaine 4 % topical cream topical 4% cream Class: TOPICAL LOCAL ANESTHETICS Indication: BALDOMERO, MIRARCHI ([id number]) Dose: Frequency / Time: 1 1 cream topical Number of Refills: Number of Units: 0 Generic Substitution: Start Date: End Date: One Time Use: Substitution Permitted No Note to Pharmacy: Signature(s): Date(s): Electronic Signature(s) Signed: 02/13/2018 5:03:20 PM By: Worthy Keeler PA-C Signed: 02/14/2018 4:27:21 PM By: Alric Quan Entered By: Alric Quan on 02/13/2018 09:37:01 [name] ([id number]) --------------------------------------------------------------------------------  Problem List Details Patient Name: ELSWORTH, LEDIN. Date of Service: 02/13/2018 9:00 AM Medical Record Number: [id number] Patient Account Number: [id number] Date of Birth/Sex: 1931/05/21 (84 y.o. Male) Treating RN: Ahmed Prima Primary Care Provider: Burman Freestone Other Clinician: Referring Provider: Burman Freestone Treating Provider/Extender: [name], Jamelle Goldston Weeks in Treatment: 1 Active Problems ICD-10 Impacting Encounter Code Description Active Date Wound Healing Diagnosis L89.154 Pressure ulcer of sacral region, stage 4 02/06/2018 Yes L24.0 Irritant contact  dermatitis due to detergents 02/06/2018 Yes Inactive Problems Resolved Problems Electronic Signature(s) Signed: 02/13/2018 5:03:20 PM By: Worthy Keeler PA-C Entered By: Worthy Keeler on 02/13/2018 09:25:59 [name] ([id number]) -------------------------------------------------------------------------------- Progress Note Details Patient Name: [name] Date of Service: 02/13/2018 9:00 AM Medical Record Number: [id number] Patient Account Number: [id number] Date of Birth/Sex: May 11, 1931 (84 y.o. Male) Treating RN: Ahmed Prima Primary Care Provider: Burman Freestone Other Clinician: Referring Provider: Burman Freestone Treating Provider/Extender: [name], Iceis Knab Weeks in Treatment: 1 Subjective Chief Complaint Information obtained from Patient Sacral pressure ulcer and left shoulder rash History of Present Illness (HPI) 02/06/18 on evaluation today patient presents for initial evaluation and our clinic concerning an issue which began roughly 3 weeks ago when the patient fell in his home on the floor in his kitchen and laid him down this detergent for roughly 3 days. He had a pressure injury to the left shoulder. This unfortunately has caused him a lot of discomfort although it finally  seems to be doing better if anything is really having a lot of itching right now. This appears potentially be a contact dermatitis issue. He also has a significant pressure injury to the sacrum at this time as well which is also showing fascia exposure right over the bone but no evidence of bone exposure at this point which is good news. They have been using Santyl as well as Saline soaked gauze at this point in time. There does appear to be a lot of necrotic slough in the base of the wound. He does have a history of incontinence, myocardial infarction, and hypertension. He also is "borderline diabetic" hemoglobin A1c of 6.0. Currently he has some discomfort in the pressure site at the  sacrum but fortunately nothing too significant this did require sharp debridement today. 02/13/18 on evaluation today patient appears to be doing much better in regard to his sacral wound. He has been tolerating the dressing changes without complication with the Vashe. Fortunately there is no evidence of infection and though there is some Slough on the surface of the wound bed he has excellent granulation noted. Overall I'm pleased with how things have progressed in that regard. A glance at his shoulder as well and the rash seems to be someone improving in my pinion at this site as well. Overall I am pleased with what we're seeing and so is the family. Patient History Information obtained from Patient. Family History Cancer - Paternal Grandparents, Diabetes - Father, Heart Disease - Mother,Father, Stroke - Father, No family history of Hypertension, Kidney Disease, Lung Disease, Seizures, Thyroid Problems, Tuberculosis. Social History Never smoker, Marital Status - Widowed, Alcohol Use - Never, Drug Use - No History, Caffeine Use - Daily. Medical History Hospitalization/Surgery History - 01/20/2018, [facility], [place]. 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 HARSHAN, KEARLEY ([id number]) Respiratory The patient has no complaints or symptoms. Cardiovascular The patient has no complaints or symptoms. Psychiatric The patient has no complaints or symptoms. Objective Constitutional Well-nourished and well-hydrated in no acute distress. Vitals Time Taken: 9:07 AM, Height: 69 in, Weight: 155 lbs, BMI: 22.9, Temperature: 98.2 F, Pulse: 70 bpm, Respiratory Rate: 16 breaths/min, Blood Pressure: 144/63 mmHg. Respiratory normal breathing without difficulty. Psychiatric this patient is able to make decisions and demonstrates good insight into  disease process. Alert and Oriented x 3. pleasant and cooperative. General Notes: Patient's wound to have some Slough noted over the surface of the sacral wound which did require some debridement today although this was minimal. Post debridement the wound bed appears to be doing much better I think that after one more week of treating this with the Vashe patient may be ready for progression to the Wound VAC. Integumentary (Hair, Skin) Wound #1 status is Open. Original cause of wound was Pressure Injury. The wound is located on the Sacrum. The wound measures 5.1cm length x 4.9cm width x 1.7cm depth; 19.627cm^2 area and 33.366cm^3 volume. There is Fat Layer (Subcutaneous Tissue) Exposed exposed. There is no tunneling noted, however, there is undermining starting at 1:00 and ending at 1:00 with a maximum distance of 2.5cm. There is a large amount of serous drainage noted. Foul odor after cleansing was noted. The wound margin is distinct with the outline attached to the wound base. There is large (67-100%) red, hyper - granulation within the wound bed. There is a small (1-33%) amount of necrotic tissue within  the wound bed including Adherent Slough. The periwound skin appearance exhibited: Maceration, Erythema. The surrounding wound skin color is noted with erythema which is circumferential. Periwound temperature was noted as No Abnormality. The periwound has tenderness on palpation. Assessment Active Problems ICD-10 L89.154 - Pressure ulcer of sacral region, stage 4 L24.0 - Irritant contact dermatitis due to detergents JASIN, BRAZEL ([id number]) Procedures Wound #1 Pre-procedure diagnosis of Wound #1 is a Pressure Ulcer located on the Sacrum . There was a Excisional Skin/Subcutaneous Tissue Debridement with a total area of 24.99 sq cm performed by STONE III, Maciej Schweitzer E., PA-C. With the following instrument(s): Curette. to remove Viable and Non-Viable tissue/material Material removed includes  Subcutaneous Tissue, and Slough, Fibrin/Exudate, and [place] after achieving pain control using Lidocaine 4% Topical Solution. No specimens were taken. A time out was conducted at 09:41, prior to the start of the procedure. A Minimum amount of bleeding was controlled with Pressure. The procedure was tolerated well with a pain level of 0 throughout and a pain level of 0 following the procedure. Post Debridement Measurements: 5.1cm length x 4.9cm width x 1.8cm depth; 35.329cm^3 volume. Post debridement Stage noted as Category/Stage III. Character of Wound/Ulcer Post Debridement requires further debridement. Post procedure Diagnosis Wound #1: Same as Pre-Procedure Plan Wound Cleansing: Wound #1 Sacrum: Clean wound with Normal Saline. Anesthetic (add to Medication List): Wound #1 Sacrum: Topical Lidocaine 4% cream applied to wound bed prior to debridement (In Clinic Only). Skin Barriers/Peri-Wound Care: Wound #1 Sacrum: Barrier cream Primary Wound Dressing: Wound #1 Sacrum: Dry Gauze - soaked in Vashe or Dakins and packed into wound Secondary Dressing: Wound #1 Sacrum: ABD pad Other - Drawtex or equal Dressing Change Frequency: Wound #1 Sacrum: Change dressing every day. Follow-up Appointments: Wound #1 Sacrum: Return Appointment in 1 week. Off-Loading: Wound #1 Sacrum: Turn and reposition every 2 hours Home Health: Wound #1 Sacrum: Continue Home Health Visits - [place] Nurse may visit PRN to address patient s wound care needs. FACE TO FACE ENCOUNTER: MEDICARE and MEDICAID PATIENTS: I certify that this patient is under my care and that I had a face-to-face encounter that meets the physician face-to-face encounter requirements with this patient on this date. The VERNELL, TOWNLEY ([id number]) encounter with the patient was in whole or in part for the following MEDICAL CONDITION: (primary reason for [place]) MEDICAL NECESSITY: I certify, that based on my  findings, NURSING services are a medically necessary home health service. HOME BOUND STATUS: I certify that my clinical findings support that this patient is homebound (i.e., Due to illness or injury, pt requires aid of supportive devices such as crutches, cane, wheelchairs, walkers, the use of special transportation or the assistance of another person to leave their place of residence. There is a normal inability to leave the home and doing so requires considerable and taxing effort. Other absences are for medical reasons / religious services and are infrequent or of short duration when for other reasons). If current dressing causes regression in wound condition, may D/C ordered dressing product/s and apply Normal Saline Moist Dressing daily until next [place] / Other MD appointment. [place] of regression in wound condition at [phone]. Please direct any NON-WOUND related issues/requests for orders to patient's Primary Care Physician Medications-please add to medication list.: Wound #1 Sacrum: Other: - TAC on back and shoulder The following medication(s) was prescribed: lidocaine topical 4 % cream 1 1 cream topical was prescribed at facility I'm gonna recommend that  we continue with the Vashe for the next week. They are in agreement with this plan. Subsequently we will see were things stand at that point in time when I see him for reevaluation. If things are doing as well there are currently then I will recommend going ahead and ordering the Wound VAC at that time. They are in agreement with this plan. If anything changes in the interim they will contact our office for additional recommendations. Please see above for specific wound care orders. We will see patient for re-evaluation in 1 week(s) here in the clinic. If anything worsens or changes patient will contact our office for additional recommendations. Electronic Signature(s) Signed: 02/13/2018 5:03:20  PM By: Worthy Keeler PA-C Entered By: Worthy Keeler on 02/13/2018 09:50:44 [name] ([id number]) -------------------------------------------------------------------------------- ROS/PFSH Details Patient Name: [name] Date of Service: 02/13/2018 9:00 AM Medical Record Number: [id number] Patient Account Number: [id number] Date of Birth/Sex: 07-11-1931 (84 y.o. Male) Treating RN: Carolyne Fiscal, Debi Primary Care Provider: Burman Freestone Other Clinician: Referring Provider: Burman Freestone Treating Provider/Extender: [name], Marissa Lowrey Weeks in Treatment: 1 Information Obtained From Patient Wound History Do you currently have one or more open woundso Yes How many open wounds do you currently haveo 2 Approximately how long have you had your woundso 3 weeks Has your wound(s) ever healed and then re-openedo Yes Have you had any lab work done in the past montho Yes Have you tested positive for osteomyelitis (bone infection)o No Have you had any tests for circulation on your legso No Constitutional Symptoms (General Health) Complaints and Symptoms: Negative for: Fever; Chills Eyes Medical History: Positive for: Cataracts - bilateral removal Negative for: Glaucoma; Optic Neuritis Ear/Nose/Mouth/Throat Medical History: Negative for: Chronic sinus problems/congestion; Middle ear problems Hematologic/Lymphatic Medical History: Negative for: Anemia; Hemophilia; Human Immunodeficiency Virus; Lymphedema; Sickle Cell Disease Respiratory Complaints and Symptoms: No Complaints or Symptoms Medical History: Positive for: Asthma Negative for: Aspiration; Chronic Obstructive Pulmonary Disease (COPD); Pneumothorax; Sleep Apnea; Tuberculosis Cardiovascular Complaints and Symptoms: No Complaints or Symptoms Medical History: Positive for: Angina; Arrhythmia; Coronary Artery Disease; Hypertension; Myocardial Infarction - 2001 Negative for: Congestive Heart Failure; Deep Vein  Thrombosis; Hypotension; Peripheral Arterial Disease; Peripheral JARRYD, GRATZ ([id number]) Venous Disease; Phlebitis; Vasculitis Gastrointestinal Medical History: Negative for: Cirrhosis ; Colitis; Crohnos; Hepatitis A; Hepatitis B; Hepatitis C Endocrine Medical History: Negative for: Type I Diabetes; Type II Diabetes Past Medical History Notes: Borderline Genitourinary Medical History: Negative for: End Stage Renal Disease Immunological Medical History: Negative for: Lupus Erythematosus; Raynaudos; Scleroderma Integumentary (Skin) Medical History: Negative for: History of Burn; History of pressure wounds Musculoskeletal Medical History: Positive for: Osteoarthritis Negative for: Gout; Rheumatoid Arthritis; Osteomyelitis Neurologic Medical History: Positive for: Dementia; Neuropathy Negative for: Quadriplegia; Paraplegia; Seizure Disorder Oncologic Medical History: Negative for: Received Chemotherapy; Received Radiation Past Medical History Notes: Melanoma on back Psychiatric Complaints and Symptoms: No Complaints or Symptoms Medical History: Negative for: Anorexia/bulimia; Confinement Anxiety HBO Extended History Items Eyes: Cataracts FIELDS, OROS ([id number]) Immunizations Pneumococcal Vaccine: Received Pneumococcal Vaccination: Yes Implantable Devices Hospitalization / Surgery History Name of Hospital Purpose of Hospitalization/Surgery Date [facility] Fall 01/20/2018 Family and Social History Cancer: Yes - Paternal Grandparents; Diabetes: Yes - Father; Heart Disease: Yes - Mother,Father; Hypertension: No; Kidney Disease: No; Lung Disease: No; Seizures: No; Stroke: Yes - Father; Thyroid Problems: No; Tuberculosis: No; Never smoker; Marital Status - Widowed; Alcohol Use: Never; Drug Use: No History; Caffeine Use: Daily; Financial Concerns: No; Food, Clothing or Shelter Needs: No; Support System Lacking:  No; Transportation Concerns: No; Advanced Directives: Yes  (Not Provided); Patient does not want information on Advanced Directives; Do not resuscitate: Yes (Not Provided); Living Will: Yes (Not Provided); Medical Power of Attorney: Yes - Derrius Furtick (Not Provided) Physician Affirmation I have reviewed and agree with the above information. Electronic Signature(s) Signed: 02/13/2018 5:03:20 PM By: Worthy Keeler PA-C Signed: 02/14/2018 4:27:21 PM By: Alric Quan Entered By: Worthy Keeler on 02/13/2018 09:49:40 Debord, [name] ([id number]) -------------------------------------------------------------------------------- SuperBill Details Patient Name: [name] Date of Service: 02/13/2018 Medical Record Number: [id number] Patient Account Number: [id number] Date of Birth/Sex: 07-15-1931 (84 y.o. Male) Treating RN: Carolyne Fiscal, Debi Primary Care Provider: Burman Freestone Other Clinician: Referring Provider: Burman Freestone Treating Provider/Extender: [name], Darwyn Ponzo Weeks in Treatment: 1 Diagnosis Coding ICD-10 Codes Code Description L89.154 Pressure ulcer of sacral region, stage 4 L24.0 Irritant contact dermatitis due to detergents Facility Procedures CPT4 Code: [id number] Description: 11042 - DEB SUBQ TISSUE 20 SQ CM/< ICD-10 Diagnosis Description L89.154 Pressure ulcer of sacral region, stage 4 Modifier: Quantity: 1 CPT4 Code: [id number] Description: 11045 - DEB SUBQ TISS EA ADDL 20CM ICD-10 Diagnosis Description 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

## 2018-02-20 ENCOUNTER — Encounter: Payer: Medicare Other | Attending: Physician Assistant | Admitting: Physician Assistant

## 2018-02-20 ENCOUNTER — Other Ambulatory Visit
Admission: RE | Admit: 2018-02-20 | Discharge: 2018-02-20 | Disposition: A | Discharge status: Home / Self Care | Payer: Medicare Other | Source: Ambulatory Visit | Attending: Physician Assistant | Admitting: Physician Assistant

## 2018-02-20 DIAGNOSIS — Z882 Allergy status to sulfonamides status: Secondary | ICD-10-CM | POA: Diagnosis not present

## 2018-02-20 DIAGNOSIS — Z91041 Radiographic dye allergy status: Secondary | ICD-10-CM | POA: Insufficient documentation

## 2018-02-20 DIAGNOSIS — I10 Essential (primary) hypertension: Secondary | ICD-10-CM | POA: Insufficient documentation

## 2018-02-20 DIAGNOSIS — M1990 Unspecified osteoarthritis, unspecified site: Secondary | ICD-10-CM | POA: Diagnosis not present

## 2018-02-20 DIAGNOSIS — L89154 Pressure ulcer of sacral region, stage 4: Secondary | ICD-10-CM | POA: Diagnosis not present

## 2018-02-20 DIAGNOSIS — Z886 Allergy status to analgesic agent status: Secondary | ICD-10-CM | POA: Diagnosis not present

## 2018-02-20 DIAGNOSIS — I252 Old myocardial infarction: Secondary | ICD-10-CM | POA: Insufficient documentation

## 2018-02-20 DIAGNOSIS — B999 Unspecified infectious disease: Secondary | ICD-10-CM | POA: Diagnosis present

## 2018-02-20 DIAGNOSIS — Z888 Allergy status to other drugs, medicaments and biological substances status: Secondary | ICD-10-CM | POA: Insufficient documentation

## 2018-02-20 DIAGNOSIS — R32 Unspecified urinary incontinence: Secondary | ICD-10-CM | POA: Insufficient documentation

## 2018-02-20 DIAGNOSIS — I2510 Atherosclerotic heart disease of native coronary artery without angina pectoris: Secondary | ICD-10-CM | POA: Diagnosis not present

## 2018-02-20 DIAGNOSIS — L240 Irritant contact dermatitis due to detergents: Secondary | ICD-10-CM | POA: Insufficient documentation

## 2018-02-20 DIAGNOSIS — F0390 Unspecified dementia without behavioral disturbance: Secondary | ICD-10-CM | POA: Insufficient documentation

## 2018-02-20 DIAGNOSIS — Z885 Allergy status to narcotic agent status: Secondary | ICD-10-CM | POA: Diagnosis not present

## 2018-02-20 DIAGNOSIS — E1140 Type 2 diabetes mellitus with diabetic neuropathy, unspecified: Secondary | ICD-10-CM | POA: Diagnosis not present

## 2018-02-20 DIAGNOSIS — J45909 Unspecified asthma, uncomplicated: Secondary | ICD-10-CM | POA: Insufficient documentation

## 2018-02-20 DIAGNOSIS — Z85820 Personal history of malignant melanoma of skin: Secondary | ICD-10-CM | POA: Insufficient documentation

## 2018-02-22 NOTE — Progress Notes (Signed)
JUWANN, SHERK ([id number]) Visit Report for 02/20/2018 Chief Complaint Document Details Patient Name: [name], [name]. Date of Service: 02/20/2018 10:00 AM Medical Record Number: [id number] Patient Account Number: [id number] Date of Birth/Sex: 27-Nov-1930 (84 y.o. M) Treating RN: Ahmed Prima Primary Care Provider: Burman Freestone Other Clinician: Referring Provider: Burman Freestone Treating Provider/Extender: [name], Gaylene Moylan Weeks in Treatment: 2 Information Obtained from: Patient Chief Complaint Sacral pressure ulcer and left shoulder rash Electronic Signature(s) Signed: 02/20/2018 5:56:33 PM By: Worthy Keeler PA-C Entered By: Worthy Keeler on 02/20/2018 10:36:29 [name] ([id number]) -------------------------------------------------------------------------------- HPI Details Patient Name: [name] Date of Service: 02/20/2018 10:00 AM Medical Record Number: [id number] Patient Account Number: [id number] Date of Birth/Sex: 1931-08-26 (84 y.o. M) Treating RN: Ahmed Prima Primary Care Provider: Burman Freestone Other Clinician: Referring Provider: Burman Freestone Treating Provider/Extender: [name], Rosser Collington Weeks in Treatment: 2 History of Present Illness HPI Description: 02/06/18 on evaluation today patient presents for initial evaluation and our clinic concerning an issue which began roughly 3 weeks ago when the patient fell in his home on the floor in his kitchen and laid him down this detergent for roughly 3 days. He had a pressure injury to the left shoulder. This unfortunately has caused him a lot of discomfort although it finally seems to be doing better if anything is really having a lot of itching right now. This appears potentially be a contact dermatitis issue. He also has a significant pressure injury to the sacrum at this time as well which is also showing fascia exposure right over the bone but no evidence of bone exposure at this point which is  good news. They have been using Santyl as well as Saline soaked gauze at this point in time. There does appear to be a lot of necrotic slough in the base of the wound. He does have a history of incontinence, myocardial infarction, and hypertension. He also is "borderline diabetic" hemoglobin A1c of 6.0. Currently he has some discomfort in the pressure site at the sacrum but fortunately nothing too significant this did require sharp debridement today. 02/13/18 on evaluation today patient appears to be doing much better in regard to his sacral wound. He has been tolerating the dressing changes without complication with the Vashe. Fortunately there is no evidence of infection and though there is some Slough on the surface of the wound bed he has excellent granulation noted. Overall I'm pleased with how things have progressed in that regard. A glance at his shoulder as well and the rash seems to be someone improving in my pinion at this site as well. Overall I am pleased with what we're seeing and so is the family. 02/20/18 on evaluation today patient appears to be doing a little worse in regard to the sacral wound only in the fact that there is redness surrounding it has me somewhat concerned for infection. The drainage has also apparently been a little bit off color compared to normal according to family they did keep the dressing today that was removed to show me and I agree this seems to be a little bit different compared to what we have been seeing. Coupled with the redness I'm concerned he may be developing some cellulitis surrounding the wound bed. Electronic Signature(s) Signed: 02/20/2018 5:56:33 PM By: Worthy Keeler PA-C Entered By: Worthy Keeler on 02/20/2018 12:42:01 [name] ([id number]) -------------------------------------------------------------------------------- Physical Exam Details Patient Name: [name], [name] Date of Service: 02/20/2018 10:00 AM Medical Record Number:  [id number] Patient Account Number: [id number] Date of Birth/Sex: [birth year] (84 y.o. M) Treating RN: Ahmed Prima Primary Care Provider: Burman Freestone Other Clinician: Referring Provider: Burman Freestone Treating Provider/Extender: [name] III, [name] Weeks in Treatment: 2 Constitutional Well-nourished and well-hydrated in no acute distress. Respiratory normal breathing without difficulty. clear to auscultation bilaterally. Cardiovascular regular rate and rhythm with normal S1, S2. Psychiatric this patient is able to make decisions and demonstrates good insight into disease process. Alert and Oriented x 3. pleasant and cooperative. Notes Patient's wound though it did have some Slough noted on the surface today was not sharply debrided secondary to what appear to be some increased pain likely due to infection I did not want to worsen anything in this regard. With that being said there does not appear to be any evidence of infection spreading to other locations and definitely no evidence of systemic infection. Electronic Signature(s) Signed: 02/20/2018 5:56:33 PM By: Worthy Keeler PA-C Entered By: Worthy Keeler on 02/20/2018 12:42:58 [name] ([id number]) -------------------------------------------------------------------------------- Physician Orders Details Patient Name: [name], [name] Date of Service: 02/20/2018 10:00 AM Medical Record Number: [id number] Patient Account Number: [id number] Date of Birth/Sex: 01-02-1931 (84 y.o. M) Treating RN: Ahmed Prima Primary Care Provider: Burman Freestone Other Clinician: Referring Provider: Burman Freestone Treating Provider/Extender: [name], [name] Weeks in Treatment: 2 Verbal / Phone Orders: Yes Clinician: Pinkerton, Debi Read Back and Verified: Yes Diagnosis Coding ICD-10 Coding Code Description L89.154 Pressure ulcer of sacral region, stage 4 L24.0 Irritant contact dermatitis due to detergents Wound  Cleansing Wound #1 Sacrum o Clean wound with Normal Saline. Anesthetic (add to Medication List) Wound #1 Sacrum o Topical Lidocaine 4% cream applied to wound bed prior to debridement (In Clinic Only). Skin Barriers/Peri-Wound Care Wound #1 Sacrum o Barrier cream Primary Wound Dressing Wound #1 Sacrum o Dry Gauze - soaked in Vashe or Dakins and packed into wound Secondary Dressing Wound #1 Sacrum o ABD pad o Other - Drawtex or equal Dressing Change Frequency Wound #1 Sacrum o Change dressing every day. Follow-up Appointments Wound #1 Sacrum o Return Appointment in 1 week. Off-Loading Wound #1 Sacrum o Turn and reposition every 2 hours Home Health Wound #1 [name], [name] ([id number]) o [place] Visits - [place] Nurse may visit PRN to address patientos wound care needs. o FACE TO FACE ENCOUNTER: MEDICARE and MEDICAID PATIENTS: I certify that this patient is under my care and that I had a face-to-face encounter that meets the physician face-to-face encounter requirements with this patient on this date. The encounter with the patient was in whole or in part for the following MEDICAL CONDITION: (primary reason for [place]) MEDICAL NECESSITY: I certify, that based on my findings, NURSING services are a medically necessary home health service. HOME BOUND STATUS: I certify that my clinical findings support that this patient is homebound (i.e., Due to illness or injury, pt requires aid of supportive devices such as crutches, cane, wheelchairs, walkers, the use of special transportation or the assistance of another person to leave their place of residence. There is a normal inability to leave the home and doing so requires considerable and taxing effort. Other absences are for medical reasons / religious services and are infrequent or of short duration when for other reasons). o If current dressing causes regression in  wound condition, may D/C ordered dressing product/s and apply Normal Saline Moist Dressing daily until next [place] / Other MD appointment. [place]  of regression in wound condition at [phone]. o Please direct any NON-WOUND related issues/requests for orders to patient's Primary Care Physician Medications-please add to medication list. Wound #1 Sacrum o Other: - TAC on back and shoulder Laboratory o Bacteria identified in Wound by Culture (MICRO) oooo LOINC Code: 5329-9 oooo Convenience Name: Wound culture routine Patient Medications Allergies: Lyrica, gabapentin, Sulfa (Sulfonamide Antibiotics), contrast dye, budesonide, iohexol, simvastatin, Demerol, meperidine, adhesive tape, naproxen, pregabalin Notifications Medication Indication Start End lidocaine DOSE 1 - topical 4 % cream - 1 cream topical doxycycline hyclate 02/21/2018 DOSE 1 - oral 100 mg tablet - 1 tablet oral taken 2 times a day for 10 days Electronic Signature(s) Signed: 02/20/2018 11:53:45 AM By: Worthy Keeler PA-C Entered By: Worthy Keeler on 02/20/2018 11:53:43 [name] ([id number]) -------------------------------------------------------------------------------- Prescription 02/20/2018 Patient Name: [name] Provider: Worthy Keeler PA-C Date of Birth: 1930/11/09 NPI#: [id number] Sex: M DEA#: [id number] Phone #: [phone] License #: Patient Address: [facility] [place], [place] [address] [address], [place], [place] [address] [phone] Allergies Lyrica gabapentin Sulfa (Sulfonamide Antibiotics) contrast dye budesonide iohexol simvastatin Demerol meperidine adhesive tape naproxen pregabalin Medication Medication: Route: Strength: Form: lidocaine 4 % topical cream topical 4% cream Class: TOPICAL LOCAL ANESTHETICS Indication: [name], [name] ([id number]) Dose: Frequency / Time: 1 1 cream topical Number of Refills: Number of Units: 0 Generic Substitution: Start Date: End Date: One Time Use: Substitution Permitted No Note to Pharmacy: Signature(s): Date(s): Electronic Signature(s) Signed: 02/20/2018 5:56:33 PM By: Worthy Keeler PA-C Entered By: Worthy Keeler on 02/20/2018 11:53:46 [name] ([id number]) --------------------------------------------------------------------------------  Problem List Details Patient Name: [name] Date of Service: 02/20/2018 10:00 AM Medical Record Number: [id number] Patient Account Number: [id number] Date of Birth/Sex: 11/06/1930 (84 y.o. M) Treating RN: Ahmed Prima Primary Care Provider: Burman Freestone Other Clinician: Referring Provider: Burman Freestone Treating Provider/Extender: [name], Crystal Scarberry Weeks in Treatment: 2 Active Problems ICD-10 Impacting Encounter Code Description Active Date Wound Healing Diagnosis L89.154 Pressure ulcer of sacral region, stage 4 02/06/2018 Yes L24.0 Irritant contact dermatitis due to detergents 02/06/2018 Yes Inactive Problems Resolved Problems Electronic Signature(s) Signed: 02/20/2018 5:56:33 PM By: Worthy Keeler PA-C Entered By: Worthy Keeler on 02/20/2018 10:36:20 [name] ([id number]) -------------------------------------------------------------------------------- Progress Note Details Patient Name: [name] Date of Service: 02/20/2018 10:00 AM Medical Record Number: [id number] Patient Account Number: [id number] Date of Birth/Sex: [birth year] (84 y.o. M) Treating RN: Ahmed Prima Primary Care Provider: Burman Freestone Other Clinician: Referring Provider: Burman Freestone Treating Provider/Extender: [name], Vasil Juhasz Weeks in Treatment: 2 Subjective Chief Complaint Information obtained from Patient Sacral pressure ulcer and left shoulder rash History of Present Illness (HPI) 02/06/18 on  evaluation today patient presents for initial evaluation and our clinic concerning an issue which began roughly 3 weeks ago when the patient fell in his home on the floor in his kitchen and laid him down this detergent for roughly 3 days. He had a pressure injury to the left shoulder. This unfortunately has caused him a lot of discomfort although it finally seems to be doing better if anything is really having a lot of itching right now. This appears potentially be a contact dermatitis issue. He also has a significant pressure injury to the sacrum at this time as well which is also showing fascia exposure right over the bone but no evidence of bone exposure at this point which is  good news. They have been using Santyl as well as Saline soaked gauze at this point in time. There does appear to be a lot of necrotic slough in the base of the wound. He does have a history of incontinence, myocardial infarction, and hypertension. He also is "borderline diabetic" hemoglobin A1c of 6.0. Currently he has some discomfort in the pressure site at the sacrum but fortunately nothing too significant this did require sharp debridement today. 02/13/18 on evaluation today patient appears to be doing much better in regard to his sacral wound. He has been tolerating the dressing changes without complication with the Vashe. Fortunately there is no evidence of infection and though there is some Slough on the surface of the wound bed he has excellent granulation noted. Overall I'm pleased with how things have progressed in that regard. A glance at his shoulder as well and the rash seems to be someone improving in my pinion at this site as well. Overall I am pleased with what we're seeing and so is the family. 02/20/18 on evaluation today patient appears to be doing a little worse in regard to the sacral wound only in the fact that there is redness surrounding it has me somewhat concerned for infection. The drainage has also  apparently been a little bit off color compared to normal according to family they did keep the dressing today that was removed to show me and I agree this seems to be a little bit different compared to what we have been seeing. Coupled with the redness I'm concerned he may be developing some cellulitis surrounding the wound bed. Patient History Information obtained from Patient. Family History Cancer - Paternal Grandparents, Diabetes - Father, Heart Disease - Mother,Father, Stroke - Father, No family history of Hypertension, Kidney Disease, Lung Disease, Seizures, Thyroid Problems, Tuberculosis. Social History Never smoker, Marital Status - Widowed, Alcohol Use - Never, Drug Use - No History, Caffeine Use - Daily. Medical History Hospitalization/Surgery History - 01/20/2018, [facility], Fall. Medical And Surgical History Notes Endocrine Borderline [name], [name] ([id number]) Oncologic Melanoma on back Review of Systems (ROS) Constitutional Symptoms ([place]) Denies complaints or symptoms of Fever, Chills. Respiratory The patient has no complaints or symptoms. Cardiovascular The patient has no complaints or symptoms. Psychiatric The patient has no complaints or symptoms. Objective Constitutional Well-nourished and well-hydrated in no acute distress. Vitals Time Taken: 10:52 AM, Height: 69 in, Weight: 155 lbs, BMI: 22.9, Temperature: 98.3 F, Pulse: 57 bpm, Respiratory Rate: 16 breaths/min, Blood Pressure: 138/63 mmHg. Respiratory normal breathing without difficulty. clear to auscultation bilaterally. Cardiovascular regular rate and rhythm with normal S1, S2. Psychiatric this patient is able to make decisions and demonstrates good insight into disease process. Alert and Oriented x 3. pleasant and cooperative. General Notes: Patient's wound though it did have some Slough noted on the surface today was not sharply debrided secondary to what appear to be some increased pain  likely due to infection I did not want to worsen anything in this regard. With that being said there does not appear to be any evidence of infection spreading to other locations and definitely no evidence of systemic infection. Integumentary (Hair, Skin) Wound #1 status is Open. Original cause of wound was Pressure Injury. The wound is located on the Sacrum. The wound measures 5.2cm length x 4.7cm width x 1.3cm depth; 19.195cm^2 area and 24.954cm^3 volume. There is Fat Layer (Subcutaneous Tissue) Exposed exposed. Tunneling has been noted at 2:00 with a maximum distance of 1.7cm. There is additional  tunneling at 6:00 with a maximum distance of 1.5cm, and at 11:00 with a maximum distance of 1.3cm. Undermining begins at 12:00 and ends at 12:00 with a maximum distance of 2cm. There is a large amount of serosanguineous drainage noted. Foul odor after cleansing was noted. The wound margin is distinct with the outline attached to the wound base. There is large (67-100%) red, hyper - granulation within the wound bed. There is a small (1-33%) amount of necrotic tissue within the wound bed including Adherent Slough. The periwound skin appearance exhibited: Maceration, Ecchymosis, Erythema. The surrounding wound skin color is noted with erythema which is circumferential. Periwound temperature was noted as No Abnormality. The periwound has tenderness on palpation. [name], [name] ([id number]) Assessment Active Problems ICD-10 L89.154 - Pressure ulcer of sacral region, stage 4 L24.0 - Irritant contact dermatitis due to detergents Plan Wound Cleansing: Wound #1 Sacrum: Clean wound with Normal Saline. Anesthetic (add to Medication List): Wound #1 Sacrum: Topical Lidocaine 4% cream applied to wound bed prior to debridement (In Clinic Only). Skin Barriers/Peri-Wound Care: Wound #1 Sacrum: Barrier cream Primary Wound Dressing: Wound #1 Sacrum: Dry Gauze - soaked in Vashe or Dakins and packed into  wound Secondary Dressing: Wound #1 Sacrum: ABD pad Other - Drawtex or equal Dressing Change Frequency: Wound #1 Sacrum: Change dressing every day. Follow-up Appointments: Wound #1 Sacrum: Return Appointment in 1 week. Off-Loading: Wound #1 Sacrum: Turn and reposition every 2 hours Home Health: Wound #1 Sacrum: Continue Home Health Visits - [place] Nurse may visit PRN to address patient s wound care needs. FACE TO FACE ENCOUNTER: MEDICARE and MEDICAID PATIENTS: I certify that this patient is under my care and that I had a face-to-face encounter that meets the physician face-to-face encounter requirements with this patient on this date. The encounter with the patient was in whole or in part for the following MEDICAL CONDITION: (primary reason for [place]) MEDICAL NECESSITY: I certify, that based on my findings, NURSING services are a medically necessary home health service. HOME BOUND STATUS: I certify that my clinical findings support that this patient is homebound (i.e., Due to illness or injury, pt requires aid of supportive devices such as crutches, cane, wheelchairs, walkers, the use of special transportation or the assistance of another person to leave their place of residence. There is a normal inability to leave the home and doing so requires considerable and taxing effort. Other absences are for medical reasons / religious services and are infrequent or of short duration when for other reasons). If current dressing causes regression in wound condition, may D/C ordered dressing product/s and apply Normal Saline Moist Dressing daily until next [place] / Other MD appointment. [place] of regression in wound condition at [phone]. [name], [name] ([id number]) Please direct any NON-WOUND related issues/requests for orders to patient's Primary Care Physician Medications-please add to medication list.: Wound #1 Sacrum: Other:  - TAC on back and shoulder Laboratory ordered were: Wound culture routine The following medication(s) was prescribed: lidocaine topical 4 % cream 1 1 cream topical was prescribed at facility doxycycline hyclate oral 100 mg tablet 1 1 tablet oral taken 2 times a day for 10 days starting 02/21/2018 I'm gonna suggest currently that we continue with the Current wound care measures and I am going to send a prescription for doxycycline into the pharmacy for the patient. We will continue with the Current wound care measures as far as the rash on the left shoulder is  concerned. Please see above for specific wound care orders. We will see patient for re-evaluation in 1 week(s) here in the clinic. If anything worsens or changes patient will contact our office for additional recommendations. Electronic Signature(s) Signed: 02/20/2018 5:56:33 PM By: Worthy Keeler PA-C Entered By: Worthy Keeler on 02/20/2018 12:44:51 [name] ([id number]) -------------------------------------------------------------------------------- ROS/PFSH Details Patient Name: [name] Date of Service: 02/20/2018 10:00 AM Medical Record Number: [id number] Patient Account Number: [id number] Date of Birth/Sex: 08/05/1931 (84 y.o. M) Treating RN: Ahmed Prima Primary Care Provider: Burman Freestone Other Clinician: Referring Provider: Burman Freestone Treating Provider/Extender: [name], Skylin Kennerson Weeks in Treatment: 2 Information Obtained From Patient Wound History Do you currently have one or more open woundso Yes How many open wounds do you currently haveo 2 Approximately how long have you had your woundso 3 weeks Has your wound(s) ever healed and then re-openedo Yes Have you had any lab work done in the past montho Yes Have you tested positive for osteomyelitis (bone infection)o No Have you had any tests for circulation on your legso No Constitutional Symptoms (General Health) Complaints and  Symptoms: Negative for: Fever; Chills Eyes Medical History: Positive for: Cataracts - bilateral removal Negative for: Glaucoma; Optic Neuritis Ear/Nose/Mouth/Throat Medical History: Negative for: Chronic sinus problems/congestion; Middle ear problems Hematologic/Lymphatic Medical History: Negative for: Anemia; Hemophilia; Human Immunodeficiency Virus; Lymphedema; Sickle Cell Disease Respiratory Complaints and Symptoms: No Complaints or Symptoms Medical History: Positive for: Asthma Negative for: Aspiration; Chronic Obstructive Pulmonary Disease (COPD); Pneumothorax; Sleep Apnea; Tuberculosis Cardiovascular Complaints and Symptoms: No Complaints or Symptoms Medical History: Positive for: Angina; Arrhythmia; Coronary Artery Disease; Hypertension; Myocardial Infarction - 2001 Negative for: Congestive Heart Failure; Deep Vein Thrombosis; Hypotension; Peripheral Arterial Disease; Peripheral [name], [name] ([id number]) Venous Disease; Phlebitis; Vasculitis Gastrointestinal Medical History: Negative for: Cirrhosis ; Colitis; Crohnos; Hepatitis A; Hepatitis B; Hepatitis C Endocrine Medical History: Negative for: Type I Diabetes; Type II Diabetes Past Medical History Notes: Borderline Genitourinary Medical History: Negative for: End Stage Renal Disease Immunological Medical History: Negative for: Lupus Erythematosus; Raynaudos; Scleroderma Integumentary (Skin) Medical History: Negative for: History of Burn; History of pressure wounds Musculoskeletal Medical History: Positive for: Osteoarthritis Negative for: Gout; Rheumatoid Arthritis; Osteomyelitis Neurologic Medical History: Positive for: Dementia; Neuropathy Negative for: Quadriplegia; Paraplegia; Seizure Disorder Oncologic Medical History: Negative for: Received Chemotherapy; Received Radiation Past Medical History Notes: Melanoma on back Psychiatric Complaints and Symptoms: No Complaints or Symptoms Medical  History: Negative for: Anorexia/bulimia; Confinement Anxiety HBO Extended History Items Eyes: Cataracts [name], [name] ([id number]) Immunizations Pneumococcal Vaccine: Received Pneumococcal Vaccination: Yes Implantable Devices Hospitalization / Surgery History Name of Hospital Purpose of Hospitalization/Surgery Date [facility] Fall 01/20/2018 Family and Social History Cancer: Yes - Paternal Grandparents; Diabetes: Yes - Father; Heart Disease: Yes - Mother,Father; Hypertension: No; Kidney Disease: No; Lung Disease: No; Seizures: No; Stroke: Yes - Father; Thyroid Problems: No; Tuberculosis: No; Never smoker; Marital Status - Widowed; Alcohol Use: Never; Drug Use: No History; Caffeine Use: Daily; Financial Concerns: No; Food, Clothing or Shelter Needs: No; Support System Lacking: No; Transportation Concerns: No; Advanced Directives: Yes (Not Provided); Patient does not want information on Advanced Directives; Do not resuscitate: Yes (Not Provided); Living Will: Yes (Not Provided); Medical Power of Attorney: Yes - [name] (Not Provided) Physician Affirmation I have reviewed and agree with the above information. Electronic Signature(s) Signed: 02/20/2018 5:11:14 PM By: Alric Quan Signed: 02/20/2018 5:56:33 PM By: Worthy Keeler PA-C Entered By: Worthy Keeler on 02/20/2018 12:42:39 [name]  Lenna Sciara ([id number]) -------------------------------------------------------------------------------- SuperBill Details Patient Name: [name], [name]. Date of Service: 02/20/2018 Medical Record Number: [id number] Patient Account Number: [id number] Date of Birth/Sex: January 14, 1931 (84 y.o. M) Treating RN: Ahmed Prima Primary Care Provider: Burman Freestone Other Clinician: Referring Provider: Burman Freestone Treating Provider/Extender: [name], Yuridia Couts Weeks in Treatment: 2 Diagnosis Coding ICD-10 Codes Code Description L89.154 Pressure ulcer of sacral region, stage 4 L24.0 Irritant contact  dermatitis due to detergents Facility Procedures CPT4 Code: 37366815 Description: [place] VISIT-LEV 3 EST PT Modifier: Quantity: 1 Physician Procedures CPT4 Code: 9470761 Description: 51834 - WC PHYS LEVEL 3 - EST PT ICD-10 Diagnosis Description L89.154 Pressure ulcer of sacral region, stage 4 L24.0 Irritant contact dermatitis due to detergents Modifier: Quantity: 1 Electronic Signature(s) Signed: 02/20/2018 1:06:04 PM By: Alric Quan Signed: 02/20/2018 5:56:33 PM By: Worthy Keeler PA-C Entered By: Alric Quan on 02/20/2018 13:06:03

## 2018-02-23 LAB — AEROBIC CULTURE W GRAM STAIN (SUPERFICIAL SPECIMEN)

## 2018-02-23 LAB — AEROBIC CULTURE  (SUPERFICIAL SPECIMEN)

## 2018-02-27 ENCOUNTER — Encounter: Payer: Medicare Other | Admitting: Physician Assistant

## 2018-02-27 DIAGNOSIS — L89154 Pressure ulcer of sacral region, stage 4: Secondary | ICD-10-CM | POA: Diagnosis not present

## 2018-03-01 NOTE — Progress Notes (Signed)
[name] ([id number]) Visit Report for 02/27/2018 Arrival Information Details Patient Name: [name], [name]. Date of Service: 02/27/2018 9:15 AM Medical Record Number: [id number] Patient Account Number: [id number] Date of Birth/Sex: 05-18-1931 (84 y.o. M) Treating RN: Roger Shelter Primary Care Rozetta Stumpp: Burman Freestone Other Clinician: Referring Jorgia Manthei: Burman Freestone Treating Dontea Corlew/Extender: [name], HOYT Weeks in Treatment: 3 Visit Information History Since Last Visit All ordered tests and consults were completed: No Patient Arrived: [name] Added or deleted any medications: No Arrival Time: 09:31 Any new allergies or adverse reactions: No Accompanied By: daughter Had a fall or experienced change in No Transfer Assistance: None activities of daily living that may affect Patient Identification Verified: Yes risk of falls: Secondary Verification Process Completed: Yes Signs or symptoms of abuse/neglect since last visito No Patient Requires Transmission-Based Precautions: No Hospitalized since last visit: No Patient Has Alerts: Yes Implantable device outside of the clinic excluding No cellular tissue based products placed in the center since last visit: Pain Present Now: Yes Electronic Signature(s) Signed: 02/27/2018 3:20:38 PM By: Roger Shelter Entered By: Roger Shelter on 02/27/2018 09:32:17 [name] ([id number]) -------------------------------------------------------------------------------- Encounter Discharge Information Details Patient Name: [name] Date of Service: 02/27/2018 9:15 AM Medical Record Number: [id number] Patient Account Number: [id number] Date of Birth/Sex: 08/27/1931 (84 y.o. M) Treating RN: Roger Shelter Primary Care Cashmere Dingley: Burman Freestone Other Clinician: Referring Keshav Winegar: Burman Freestone Treating Zakiyyah Savannah/Extender: [name], HOYT Weeks in Treatment: 3 Encounter Discharge Information Items Discharge  Condition: Stable Ambulatory Status: Cane Discharge Destination: Home Transportation: Private Auto Accompanied By: daughter Schedule Follow-up Appointment: Yes Clinical Summary of Care: Electronic Signature(s) Signed: 02/27/2018 3:20:38 PM By: Roger Shelter Entered By: Roger Shelter on 02/27/2018 10:27:53 [name] ([id number]) -------------------------------------------------------------------------------- Lower Extremity Assessment Details Patient Name: [name] Date of Service: 02/27/2018 9:15 AM Medical Record Number: [id number] Patient Account Number: [id number] Date of Birth/Sex: 21-Jan-1931 (84 y.o. M) Treating RN: Roger Shelter Primary Care Casey Fye: Burman Freestone Other Clinician: Referring Lisamarie Coke: Burman Freestone Treating Nivea Wojdyla/Extender: [name], HOYT Weeks in Treatment: 3 Electronic Signature(s) Signed: 02/27/2018 3:20:38 PM By: Roger Shelter Entered By: Roger Shelter on 02/27/2018 09:39:12 [name] ([id number]) -------------------------------------------------------------------------------- Multi Wound Chart Details Patient Name: [name] Date of Service: 02/27/2018 9:15 AM Medical Record Number: [id number] Patient Account Number: [id number] Date of Birth/Sex: 08-Jan-1931 (84 y.o. M) Treating RN: Ahmed Prima Primary Care Thorin Starner: Burman Freestone Other Clinician: Referring Haylie Mccutcheon: Burman Freestone Treating Marcello Tuzzolino/Extender: [name], HOYT Weeks in Treatment: 3 Vital Signs Height(in): 69 Pulse(bpm): 60 Weight(lbs): 155 Blood Pressure(mmHg): 133/100 Body Mass Index(BMI): 23 Temperature(F): 98.2 Respiratory Rate 16 (breaths/min): Photos: [1:No Photos] [N/A:N/A] Wound Location: [1:Sacrum] [N/A:N/A] Wounding Event: [1:Pressure Injury] [N/A:N/A] Primary Etiology: [1:Pressure Ulcer] [N/A:N/A] Comorbid History: [1:Cataracts, Asthma, Angina, Arrhythmia, Coronary Artery Disease, Hypertension, Myocardial  Infarction, Osteoarthritis, Dementia, Neuropathy] [N/A:N/A] Date Acquired: [1:01/17/2018] [N/A:N/A] Weeks of Treatment: [1:3] [N/A:N/A] Wound Status: [1:Open] [N/A:N/A] Measurements L x W x D [1:5.4x4.5x1.4] [N/A:N/A] (cm) Area (cm) : [1:19.085] [N/A:N/A] Volume (cm) : [1:26.719] [N/A:N/A] % Reduction in Area: [1:6.50%] [N/A:N/A] % Reduction in Volume: [1:34.60%] [N/A:N/A] Starting Position 1 [1:11] (o'clock): Ending Position 1 [1:5] (o'clock): Maximum Distance 1 (cm): [1:2.5] Undermining: [1:Yes] [N/A:N/A] Classification: [1:Category/Stage III] [N/A:N/A] Exudate Amount: [1:Large] [N/A:N/A] Exudate Type: [1:Purulent] [N/A:N/A] Exudate Color: [1:yellow, brown, green] [N/A:N/A] Foul Odor After Cleansing: [1:Yes] [N/A:N/A] Odor Anticipated Due to [1:No] [N/A:N/A] Product Use: Wound Margin: [1:Distinct, outline attached] [N/A:N/A] Granulation Amount: [1:Large (67-100%)] [N/A:N/A] Granulation Quality: [1:Red, Hyper-granulation] [N/A:N/A] Necrotic Amount: [1:Small (1-33%)] [N/A:N/A] Exposed Structures: [N/A:N/A]  Fat Layer (Subcutaneous Tissue) Exposed: Yes Fascia: No Tendon: No Muscle: No Joint: No Bone: No Epithelialization: None N/A N/A Periwound Skin Texture: No Abnormalities Noted N/A N/A Periwound Skin Moisture: Maceration: Yes N/A N/A Periwound Skin Color: Ecchymosis: Yes N/A N/A Erythema: Yes Erythema Location: Circumferential N/A N/A Temperature: No Abnormality N/A N/A Tenderness on Palpation: Yes N/A N/A Wound Preparation: Ulcer Cleansing: N/A N/A Rinsed/Irrigated with Saline Topical Anesthetic Applied: Other: lidocaine 4% Treatment Notes Electronic Signature(s) Signed: 02/28/2018 4:20:38 PM By: Alric Quan Entered By: Alric Quan on 02/27/2018 10:01:13 [name] ([id number]) -------------------------------------------------------------------------------- [place] Details Patient Name: [name]. Date of  Service: 02/27/2018 9:15 AM Medical Record Number: [id number] Patient Account Number: [id number] Date of Birth/Sex: 01-28-1931 (84 y.o. M) Treating RN: Ahmed Prima Primary Care Shatiqua Heroux: Burman Freestone Other Clinician: Referring Fraida Veldman: Burman Freestone Treating Ancel Easler/Extender: [name], HOYT Weeks in Treatment: 3 Active Inactive ` Abuse / Safety / Falls / Self Care Management Nursing Diagnoses: History of Falls Goals: Patient will not experience any injury related to falls Date Initiated: 02/06/2018 Target Resolution Date: 03/08/2018 Goal Status: Active Interventions: Assess fall risk on admission and as needed Treatment Activities: Patient referred to home care : 02/06/2018 Notes: ` Necrotic Tissue Nursing Diagnoses: Impaired tissue integrity related to necrotic/devitalized tissue Goals: Necrotic/devitalized tissue will be minimized in the wound bed Date Initiated: 02/06/2018 Target Resolution Date: 03/08/2018 Goal Status: Active Interventions: Assess patient pain level pre-, during and post procedure and prior to discharge Treatment Activities: Excisional debridement : 02/06/2018 Notes: ` Nutrition Nursing Diagnoses: Potential for alteratiion in Nutrition/Potential for imbalanced nutrition OBIE, KALLENBACH ([id number]) Goals: Patient/caregiver agrees to and verbalizes understanding of need to obtain nutritional consultation Date Initiated: 02/06/2018 Target Resolution Date: 03/08/2018 Goal Status: Active Interventions: Provide education on nutrition Notes: ` Orientation to the Wound Care Program Nursing Diagnoses: Knowledge deficit related to the wound healing center program Goals: Patient/caregiver will verbalize understanding of the [place] Program Date Initiated: 02/06/2018 Target Resolution Date: 03/08/2018 Goal Status: Active Interventions: Provide education on orientation to the wound center Notes: ` Pressure Nursing  Diagnoses: Knowledge deficit related to management of pressures ulcers Goals: Patient/caregiver will verbalize understanding of pressure ulcer management Date Initiated: 02/06/2018 Target Resolution Date: 03/08/2018 Goal Status: Active Interventions: Assess offloading mechanisms upon admission and as needed Provide education on pressure ulcers Notes: ` Wound/Skin Impairment Nursing Diagnoses: Knowledge deficit related to ulceration/compromised skin integrity Goals: Ulcer/skin breakdown will have a volume reduction of 80% by week 12 Date Initiated: 02/06/2018 Target Resolution Date: 04/08/2018 Goal Status: Active RUXIN, RANSOME ([id number]) Interventions: Assess ulceration(s) every visit Treatment Activities: Skin care regimen initiated : 02/06/2018 Topical wound management initiated : 02/06/2018 Notes: Electronic Signature(s) Signed: 02/28/2018 4:20:38 PM By: Alric Quan Entered By: Alric Quan on 02/27/2018 10:01:02 [name] ([id number]) -------------------------------------------------------------------------------- Pain Assessment Details Patient Name: [name] Date of Service: 02/27/2018 9:15 AM Medical Record Number: [id number] Patient Account Number: [id number] Date of Birth/Sex: 12-13-1930 (84 y.o. M) Treating RN: Roger Shelter Primary Care Lashawnda Hancox: Burman Freestone Other Clinician: Referring Treena Cosman: Burman Freestone Treating Mckinleigh Schuchart/Extender: [name], HOYT Weeks in Treatment: 3 Active Problems Location of Pain Severity and Description of Pain Patient Has Paino Yes Site Locations Pain Location: Pain in Ulcers Duration of the Pain. Constant / Intermittento Constant Rate the pain. Current Pain Level: 6 Character of Pain Describe the Pain: Aching, Burning, Shooting Pain Management and Medication Current Pain Management: Electronic Signature(s) Signed: 02/27/2018 3:20:38 PM By: Roger Shelter Entered By: Claudina Lick  Cheryl on  02/27/2018 09:32:46 MEIR, ELWOOD ([id number]) -------------------------------------------------------------------------------- Patient/Caregiver Education Details Patient Name: WAYLEN, DEPAOLO Date of Service: 02/27/2018 9:15 AM Medical Record Number: [id number] Patient Account Number: [id number] Date of Birth/Gender: 04-Oct-1931 (84 y.o. M) Treating RN: Roger Shelter Primary Care Physician: Burman Freestone Other Clinician: Referring Physician: Burman Freestone Treating Physician/Extender: Sharalyn Ink in Treatment: 3 Education Assessment Education Provided To: Patient Education Topics Provided Wound Debridement: Handouts: Wound Debridement Methods: Explain/Verbal Responses: State content correctly Wound/Skin Impairment: Handouts: Caring for Your Ulcer Methods: Explain/Verbal Responses: State content correctly Electronic Signature(s) Signed: 02/27/2018 3:20:38 PM By: Roger Shelter Entered By: Roger Shelter on 02/27/2018 10:28:14 [name] ([id number]) -------------------------------------------------------------------------------- Wound Assessment Details Patient Name: [name] Date of Service: 02/27/2018 9:15 AM Medical Record Number: [id number] Patient Account Number: [id number] Date of Birth/Sex: 16-Jul-1931 (84 y.o. M) Treating RN: Roger Shelter Primary Care Jashley Yellin: Burman Freestone Other Clinician: Referring Anginette Espejo: Burman Freestone Treating Renwick Asman/Extender: [name], HOYT Weeks in Treatment: 3 Wound Status Wound Number: 1 Primary Pressure Ulcer Etiology: Wound Location: Sacrum Wound Open Wounding Event: Pressure Injury Status: Date Acquired: 01/17/2018 Comorbid Cataracts, Asthma, Angina, Arrhythmia, Weeks Of Treatment: 3 History: Coronary Artery Disease, Hypertension, Clustered Wound: No Myocardial Infarction, Osteoarthritis, Dementia, Neuropathy Photos Photo Uploaded By: Roger Shelter on 02/27/2018  15:26:54 Wound Measurements Length: (cm) 5.4 Width: (cm) 4.5 Depth: (cm) 1.4 Area: (cm) 19.085 Volume: (cm) 26.719 % Reduction in Area: 6.5% % Reduction in Volume: 34.6% Epithelialization: None Tunneling: No Undermining: Yes Starting Position (o'clock): 11 Ending Position (o'clock): 5 Maximum Distance: (cm) 2.5 Wound Description Classification: Category/Stage III Wound Margin: Distinct, outline attached Exudate Amount: Large Exudate Type: Purulent Exudate Color: yellow, brown, green Foul Odor After Cleansing: Yes Due to Product Use: No Slough/Fibrino Yes Wound Bed Granulation Amount: Large (67-100%) Exposed Structure Granulation Quality: Red, Hyper-granulation Fascia Exposed: No Necrotic Amount: Small (1-33%) Fat Layer (Subcutaneous Tissue) Exposed: Yes Necrotic Quality: Adherent Slough Tendon Exposed: No BROUGHTON, EPPINGER ([id number]) Muscle Exposed: No Joint Exposed: No Bone Exposed: No Periwound Skin Texture Texture Color No Abnormalities Noted: No No Abnormalities Noted: No Ecchymosis: Yes Moisture Erythema: Yes No Abnormalities Noted: No Erythema Location: Circumferential Maceration: Yes Temperature / Pain Temperature: No Abnormality Tenderness on Palpation: Yes Wound Preparation Ulcer Cleansing: Rinsed/Irrigated with Saline Topical Anesthetic Applied: Other: lidocaine 4%, Treatment Notes Wound #1 (Sacrum) 1. Cleansed with: Clean wound with Normal Saline 2. Anesthetic Topical Lidocaine 4% cream to wound bed prior to debridement 4. Dressing Applied: Other dressing (specify in notes) 5. Secondary Dressing Applied ABD Pad Notes Dakins soaked gauze lightly packed into wound, covered with Drawtex and ABD secured with paper tape. Electronic Signature(s) Signed: 02/27/2018 3:20:38 PM By: Roger Shelter Entered By: Roger Shelter on 02/27/2018 09:37:37 [name]  ([id number]) -------------------------------------------------------------------------------- Vitals Details Patient Name: [name] Date of Service: 02/27/2018 9:15 AM Medical Record Number: [id number] Patient Account Number: [id number] Date of Birth/Sex: Apr 17, 1931 (84 y.o. M) Treating RN: Roger Shelter Primary Care Savannaha Stonerock: Burman Freestone Other Clinician: Referring Red Mandt: Burman Freestone Treating Murat Rideout/Extender: [name], HOYT Weeks in Treatment: 3 Vital Signs Time Taken: 09:32 Temperature (F): 98.2 Height (in): 69 Pulse (bpm): 63 Weight (lbs): 155 Respiratory Rate (breaths/min): 16 Body Mass Index (BMI): 22.9 Blood Pressure (mmHg): 133/100 Reference Range: 80 - 120 mg / dl Electronic Signature(s) Signed: 02/27/2018 3:20:38 PM By: Roger Shelter Entered By: Roger Shelter on 02/27/2018 09:33:08

## 2018-03-01 NOTE — Progress Notes (Signed)
MONISH, HALIBURTON ([id number]) Visit Report for 02/27/2018 Chief Complaint Document Details Patient Name: [name], [name]. Date of Service: 02/27/2018 9:15 AM Medical Record Number: [id number] Patient Account Number: [id number] Date of Birth/Sex: 02/10/1931 (84 y.o. M) Treating RN: Ahmed Prima Primary Care Provider: Burman Freestone Other Clinician: Referring Provider: Burman Freestone Treating Provider/Extender: [name], Vedder Brittian Weeks in Treatment: 3 Information Obtained from: Patient Chief Complaint Sacral pressure ulcer and left shoulder rash Electronic Signature(s) Signed: 02/28/2018 12:50:15 AM By: Worthy Keeler PA-C Entered By: Worthy Keeler on 02/27/2018 09:50:23 Kymani, Shimabukuro [name] ([id number]) -------------------------------------------------------------------------------- HPI Details Patient Name: [name] Date of Service: 02/27/2018 9:15 AM Medical Record Number: [id number] Patient Account Number: [id number] Date of Birth/Sex: Apr 16, 1931 (84 y.o. M) Treating RN: Ahmed Prima Primary Care Provider: Burman Freestone Other Clinician: Referring Provider: Burman Freestone Treating Provider/Extender: [name], Sheccid Lahmann Weeks in Treatment: 3 History of Present Illness HPI Description: 02/06/18 on evaluation today patient presents for initial evaluation and our clinic concerning an issue which began roughly 3 weeks ago when the patient fell in his home on the floor in his kitchen and laid him down this detergent for roughly 3 days. He had a pressure injury to the left shoulder. This unfortunately has caused him a lot of discomfort although it finally seems to be doing better if anything is really having a lot of itching right now. This appears potentially be a contact dermatitis issue. He also has a significant pressure injury to the sacrum at this time as well which is also showing fascia exposure right over the bone but no evidence of bone exposure at this point which is  good news. They have been using Santyl as well as Saline soaked gauze at this point in time. There does appear to be a lot of necrotic slough in the base of the wound. He does have a history of incontinence, myocardial infarction, and hypertension. He also is "borderline diabetic" hemoglobin A1c of 6.0. Currently he has some discomfort in the pressure site at the sacrum but fortunately nothing too significant this did require sharp debridement today. 02/13/18 on evaluation today patient appears to be doing much better in regard to his sacral wound. He has been tolerating the dressing changes without complication with the Vashe. Fortunately there is no evidence of infection and though there is some Slough on the surface of the wound bed he has excellent granulation noted. Overall I'm pleased with how things have progressed in that regard. A glance at his shoulder as well and the rash seems to be someone improving in my pinion at this site as well. Overall I am pleased with what we're seeing and so is the family. 02/20/18 on evaluation today patient appears to be doing a little worse in regard to the sacral wound only in the fact that there is redness surrounding it has me somewhat concerned for infection. The drainage has also apparently been a little bit off color compared to normal according to family they did keep the dressing today that was removed to show me and I agree this seems to be a little bit different compared to what we have been seeing. Coupled with the redness I'm concerned he may be developing some cellulitis surrounding the wound bed. 02/27/18 on evaluation today patient presents for follow-up concerning his sacral ulcer. We have received the results back from his wound culture which shows unfortunately that the doxycycline will not be of benefit for him I am going to need to initiate  treatment with something else in order to treat the pseudomonas. Otherwise he does not seem to be  having any significant pain although his daughter states there are sometimes when he states having pain. We continue to use the Vashe currently. Electronic Signature(s) Signed: 02/28/2018 12:50:15 AM By: Worthy Keeler PA-C Entered By: Worthy Keeler on 02/27/2018 23:48:12 [name] ([id number]) -------------------------------------------------------------------------------- Otelia Sergeant TISS Details Patient Name: [name], [name] Date of Service: 02/27/2018 9:15 AM Medical Record Number: [id number] Patient Account Number: [id number] Date of Birth/Sex: 05/30/1931 (84 y.o. M) Treating RN: Ahmed Prima Primary Care Provider: Burman Freestone Other Clinician: Referring Provider: Burman Freestone Treating Provider/Extender: [name], Nicoles Sedlacek Weeks in Treatment: 3 Procedure Performed for: Wound #1 Sacrum Performed By: Physician Emilio Math., PA-C Post Procedure Diagnosis Same as Pre-procedure Electronic Signature(s) Signed: 02/28/2018 4:20:38 PM By: Alric Quan Entered By: Alric Quan on 02/27/2018 10:05:26 [name] ([id number]) -------------------------------------------------------------------------------- Physical Exam Details Patient Name: [name], [name] Date of Service: 02/27/2018 9:15 AM Medical Record Number: [id number] Patient Account Number: [id number] Date of Birth/Sex: 09-22-1931 (84 y.o. M) Treating RN: Ahmed Prima Primary Care Provider: Burman Freestone Other Clinician: Referring Provider: Burman Freestone Treating Provider/Extender: STONE III, Vito Beg Weeks in Treatment: 3 Constitutional Well-nourished and well-hydrated in no acute distress. Respiratory normal breathing without difficulty. clear to auscultation bilaterally. Cardiovascular regular rate and rhythm with normal S1, S2. Psychiatric this patient is able to make decisions and demonstrates good insight into disease process. Alert and Oriented x 3. pleasant and  cooperative. Notes On inspection patient's wound shows good granulation although there was one area of hyper granulation which did require chemical cauterization with silver nitrate. Subsequently everything else seems to be doing very well as best I can tell at this point. He does have erythema surrounding the wound I do believe this is due to cellulitis secondary to infection, the pseudomonas. Obviously the doxycycline is not going to work for this unfortunately being on the amiodarone the patient is also not a candidate for Cipro I did speak with Dr. Dellia Nims about this as well a third-generation cephalosporin such as Carole Civil is what was recommended after our discussion. Obviously this doesn't work we may need to consider IV antibiotics. Electronic Signature(s) Signed: 02/28/2018 12:50:15 AM By: Worthy Keeler PA-C Entered By: Worthy Keeler on 02/27/2018 23:48:58 [name] ([id number]) -------------------------------------------------------------------------------- Physician Orders Details Patient Name: [name], [name] Date of Service: 02/27/2018 9:15 AM Medical Record Number: [id number] Patient Account Number: [id number] Date of Birth/Sex: [birth year] (84 y.o. M) Treating RN: Ahmed Prima Primary Care Provider: Burman Freestone Other Clinician: Referring Provider: Burman Freestone Treating Provider/Extender: [name], Merwin Breden Weeks in Treatment: 3 Verbal / Phone Orders: Yes Clinician: Pinkerton, Debi Read Back and Verified: Yes Diagnosis Coding ICD-10 Coding Code Description L89.154 Pressure ulcer of sacral region, stage 4 L24.0 Irritant contact dermatitis due to detergents Wound Cleansing Wound #1 Sacrum o Clean wound with Normal Saline. Anesthetic (add to Medication List) Wound #1 Sacrum o Topical Lidocaine 4% cream applied to wound bed prior to debridement (In Clinic Only). Skin Barriers/Peri-Wound Care Wound #1 Sacrum o Barrier cream Primary Wound  Dressing Wound #1 Sacrum o Other: - 3 inch conform soaked in Vashe or Dakins and packed into wound Secondary Dressing Wound #1 Sacrum o ABD pad o Dry Gauze o Other - Drawtex or equal Dressing Change Frequency Wound #1 Sacrum o Change dressing every day. Follow-up Appointments Wound #1 Sacrum o Return Appointment in 1 week. Off-Loading Wound #  1 Sacrum o Turn and reposition every 2 hours Home Health [name], [name] ([id number]) Wound #1 [place] Visits - [place] Nurse may visit PRN to address patientos wound care needs. o FACE TO FACE ENCOUNTER: MEDICARE and MEDICAID PATIENTS: I certify that this patient is under my care and that I had a face-to-face encounter that meets the physician face-to-face encounter requirements with this patient on this date. The encounter with the patient was in whole or in part for the following MEDICAL CONDITION: (primary reason for [place]) MEDICAL NECESSITY: I certify, that based on my findings, NURSING services are a medically necessary home health service. HOME BOUND STATUS: I certify that my clinical findings support that this patient is homebound (i.e., Due to illness or injury, pt requires aid of supportive devices such as crutches, cane, wheelchairs, walkers, the use of special transportation or the assistance of another person to leave their place of residence. There is a normal inability to leave the home and doing so requires considerable and taxing effort. Other absences are for medical reasons / religious services and are infrequent or of short duration when for other reasons). o If current dressing causes regression in wound condition, may D/C ordered dressing product/s and apply Normal Saline Moist Dressing daily until next [place] / Other MD appointment. [place] of regression in wound condition at  [phone]. o Please direct any NON-WOUND related issues/requests for orders to patient's Primary Care Physician Medications-please add to medication list. Wound #1 Sacrum o Other: - TAC on back and shoulder Patient Medications Allergies: Lyrica, gabapentin, Sulfa (Sulfonamide Antibiotics), contrast dye, budesonide, iohexol, simvastatin, Demerol, meperidine, adhesive tape, naproxen, pregabalin Notifications Medication Indication Start End lidocaine DOSE 1 - topical 4 % cream - 1 cream topical cefdinir 02/27/2018 DOSE 1 - oral 300 mg capsule - 1 capsule oral taken 2 times a day for 14 days Electronic Signature(s) Signed: 02/27/2018 10:30:00 AM By: Worthy Keeler PA-C Entered By: Worthy Keeler on 02/27/2018 10:29:59 [name] ([id number]) -------------------------------------------------------------------------------- Prescription 02/27/2018 Patient Name: [name] Provider: Worthy Keeler PA-C Date of Birth: 26-Jul-1931 NPI#: [id number] Sex: M DEA#: [id number] Phone #: [phone] License #: Patient Address: [facility] [place], [place] [address] [address], [place], [place] [address] [phone] Allergies Lyrica gabapentin Sulfa (Sulfonamide Antibiotics) contrast dye budesonide iohexol simvastatin Demerol meperidine adhesive tape naproxen pregabalin Medication Medication: Route: Strength: Form: lidocaine 4 % topical cream topical 4% cream Class: TOPICAL LOCAL ANESTHETICS Indication: [name], [name] ([id number]) Dose: Frequency / Time: 1 1 cream topical Number of Refills: Number of Units: 0 Generic Substitution: Start Date: End Date: One Time Use: Substitution Permitted No Note to Pharmacy: Signature(s): Date(s): Electronic Signature(s) Signed: 02/28/2018 12:50:15 AM By: Worthy Keeler PA-C Entered By: Worthy Keeler on 02/27/2018  10:30:01 [name] ([id number]) --------------------------------------------------------------------------------  Problem List Details Patient Name: [name] Date of Service: 02/27/2018 9:15 AM Medical Record Number: [id number] Patient Account Number: [id number] Date of Birth/Sex: [birth year] (84 y.o. M) Treating RN: Ahmed Prima Primary Care Provider: Burman Freestone Other Clinician: Referring Provider: Burman Freestone Treating Provider/Extender: [name], Armari Fussell Weeks in Treatment: 3 Active Problems ICD-10 Impacting Encounter Code Description Active Date Wound Healing Diagnosis L89.154 Pressure ulcer of sacral region, stage 4 02/06/2018 Yes L24.0 Irritant contact dermatitis due to detergents 02/06/2018 Yes Inactive Problems Resolved Problems Electronic  Signature(s) Signed: 02/28/2018 12:50:15 AM By: Worthy Keeler PA-C Entered By: Worthy Keeler on 02/27/2018 09:50:07 [name] ([id number]) -------------------------------------------------------------------------------- Progress Note Details Patient Name: [name] Date of Service: 02/27/2018 9:15 AM Medical Record Number: [id number] Patient Account Number: [id number] Date of Birth/Sex: 1931-04-13 (84 y.o. M) Treating RN: Ahmed Prima Primary Care Provider: Burman Freestone Other Clinician: Referring Provider: Burman Freestone Treating Provider/Extender: [name], Tahra Hitzeman Weeks in Treatment: 3 Subjective Chief Complaint Information obtained from Patient Sacral pressure ulcer and left shoulder rash History of Present Illness (HPI) 02/06/18 on evaluation today patient presents for initial evaluation and our clinic concerning an issue which began roughly 3 weeks ago when the patient fell in his home on the floor in his kitchen and laid him down this detergent for roughly 3 days. He had a pressure injury to the left shoulder. This unfortunately has caused him a lot of discomfort although it  finally seems to be doing better if anything is really having a lot of itching right now. This appears potentially be a contact dermatitis issue. He also has a significant pressure injury to the sacrum at this time as well which is also showing fascia exposure right over the bone but no evidence of bone exposure at this point which is good news. They have been using Santyl as well as Saline soaked gauze at this point in time. There does appear to be a lot of necrotic slough in the base of the wound. He does have a history of incontinence, myocardial infarction, and hypertension. He also is "borderline diabetic" hemoglobin A1c of 6.0. Currently he has some discomfort in the pressure site at the sacrum but fortunately nothing too significant this did require sharp debridement today. 02/13/18 on evaluation today patient appears to be doing much better in regard to his sacral wound. He has been tolerating the dressing changes without complication with the Vashe. Fortunately there is no evidence of infection and though there is some Slough on the surface of the wound bed he has excellent granulation noted. Overall I'm pleased with how things have progressed in that regard. A glance at his shoulder as well and the rash seems to be someone improving in my pinion at this site as well. Overall I am pleased with what we're seeing and so is the family. 02/20/18 on evaluation today patient appears to be doing a little worse in regard to the sacral wound only in the fact that there is redness surrounding it has me somewhat concerned for infection. The drainage has also apparently been a little bit off color compared to normal according to family they did keep the dressing today that was removed to show me and I agree this seems to be a little bit different compared to what we have been seeing. Coupled with the redness I'm concerned he may be developing some cellulitis surrounding the wound bed. 02/27/18 on  evaluation today patient presents for follow-up concerning his sacral ulcer. We have received the results back from his wound culture which shows unfortunately that the doxycycline will not be of benefit for him I am going to need to initiate treatment with something else in order to treat the pseudomonas. Otherwise he does not seem to be having any significant pain although his daughter states there are sometimes when he states having pain. We continue to use the Vashe currently. Patient History Information obtained from Patient. Family History Cancer - Paternal Grandparents, Diabetes - Father, Heart Disease - Mother,Father, Stroke -  Father, No family history of Hypertension, Kidney Disease, Lung Disease, Seizures, Thyroid Problems, Tuberculosis. Social History Never smoker, Marital Status - Widowed, Alcohol Use - Never, Drug Use - No History, Caffeine Use - Daily. Medical History [name], [name] ([id number]) Hospitalization/Surgery History - 01/20/2018, ARMS, Fall. 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 Respiratory The patient has no complaints or symptoms. Cardiovascular The patient has no complaints or symptoms. Psychiatric The patient has no complaints or symptoms. Objective Constitutional Well-nourished and well-hydrated in no acute distress. Vitals Time Taken: 9:32 AM, Height: 69 in, Weight: 155 lbs, BMI: 22.9, Temperature: 98.2 F, Pulse: 63 bpm, Respiratory Rate: 16 breaths/min, Blood Pressure: 133/100 mmHg. Respiratory normal breathing without difficulty. clear to auscultation bilaterally. Cardiovascular regular rate and rhythm with normal S1, S2. Psychiatric this patient is able to make decisions and demonstrates good insight into disease process. Alert and Oriented x 3. pleasant and cooperative. General Notes:  On inspection patient's wound shows good granulation although there was one area of hyper granulation which did require chemical cauterization with silver nitrate. Subsequently everything else seems to be doing very well as best I can tell at this point. He does have erythema surrounding the wound I do believe this is due to cellulitis secondary to infection, the pseudomonas. Obviously the doxycycline is not going to work for this unfortunately being on the amiodarone the patient is also not a candidate for Cipro I did speak with Dr. Dellia Nims about this as well a third-generation cephalosporin such as Carole Civil is what was recommended after our discussion. Obviously this doesn't work we may need to consider IV antibiotics. Integumentary (Hair, Skin) Wound #1 status is Open. Original cause of wound was Pressure Injury. The wound is located on the Sacrum. The wound measures 5.4cm length x 4.5cm width x 1.4cm depth; 19.085cm^2 area and 26.719cm^3 volume. There is Fat Layer (Subcutaneous Tissue) Exposed exposed. There is no tunneling noted, however, there is undermining starting at 11:00 and ending at 5:00 with a maximum distance of 2.5cm. There is a large amount of purulent drainage noted. Foul odor after [name], [name] ([id number]) cleansing was noted. The wound margin is distinct with the outline attached to the wound base. There is large (67-100%) red, hyper - granulation within the wound bed. There is a small (1-33%) amount of necrotic tissue within the wound bed including Adherent Slough. The periwound skin appearance exhibited: Maceration, Ecchymosis, Erythema. The surrounding wound skin color is noted with erythema which is circumferential. Periwound temperature was noted as No Abnormality. The periwound has tenderness on palpation. Assessment Active Problems ICD-10 L89.154 - Pressure ulcer of sacral region, stage 4 L24.0 - Irritant contact dermatitis due to detergents Procedures Wound  #1 Pre-procedure diagnosis of Wound #1 is a Pressure Ulcer located on the Sacrum . An CHEM CAUT GRANULATION TISS procedure was performed by STONE III, Indiyah Paone E., PA-C. Post procedure Diagnosis Wound #1: Same as Pre-Procedure Plan Wound Cleansing: Wound #1 Sacrum: Clean wound with Normal Saline. Anesthetic (add to Medication List): Wound #1 Sacrum: Topical Lidocaine 4% cream applied to wound bed prior to debridement (In Clinic Only). Skin Barriers/Peri-Wound Care: Wound #1 Sacrum: Barrier cream Primary Wound Dressing: Wound #1 Sacrum: Other: - 3 inch conform soaked in Vashe or Dakins and packed into wound Secondary Dressing: Wound #1 Sacrum: ABD pad Dry Gauze Other - Drawtex or equal Dressing Change Frequency: Wound #1 Sacrum: Change dressing  every day. Follow-up Appointments: Wound #1 SacrumRHEA, [name] ([id number]) Return Appointment in 1 week. Off-Loading: Wound #1 Sacrum: Turn and reposition every 2 hours Home Health: Wound #1 Sacrum: [place] Visits - [place] Nurse may visit PRN to address patient s wound care needs. FACE TO FACE ENCOUNTER: MEDICARE and MEDICAID PATIENTS: I certify that this patient is under my care and that I had a face-to-face encounter that meets the physician face-to-face encounter requirements with this patient on this date. The encounter with the patient was in whole or in part for the following MEDICAL CONDITION: (primary reason for [place]) MEDICAL NECESSITY: I certify, that based on my findings, NURSING services are a medically necessary home health service. HOME BOUND STATUS: I certify that my clinical findings support that this patient is homebound (i.e., Due to illness or injury, pt requires aid of supportive devices such as crutches, cane, wheelchairs, walkers, the use of special transportation or the assistance of another person to leave their place of residence. There is  a normal inability to leave the home and doing so requires considerable and taxing effort. Other absences are for medical reasons / religious services and are infrequent or of short duration when for other reasons). If current dressing causes regression in wound condition, may D/C ordered dressing product/s and apply Normal Saline Moist Dressing daily until next [place] / Other MD appointment. [place] of regression in wound condition at [phone]. Please direct any NON-WOUND related issues/requests for orders to patient's Primary Care Physician Medications-please add to medication list.: Wound #1 Sacrum: Other: - TAC on back and shoulder The following medication(s) was prescribed: lidocaine topical 4 % cream 1 1 cream topical was prescribed at facility cefdinir oral 300 mg capsule 1 1 capsule oral taken 2 times a day for 14 days starting 02/27/2018 I will go ahead and send in the [facility] for the patient today we'll see how things do over the next week. My hope is will be able to initiate the Wound VAC shortly in order to help this resolve more quickly. Patient is in agreement the plan. Otherwise I will see him for reevaluation in one weeks time. Please see above for specific wound care orders. We will see patient for re-evaluation in 1 week(s) here in the clinic. If anything worsens or changes patient will contact our office for additional recommendations. Electronic Signature(s) Signed: 02/28/2018 12:50:15 AM By: Worthy Keeler PA-C Entered By: Worthy Keeler on 02/27/2018 23:49:17 [name] ([id number]) -------------------------------------------------------------------------------- ROS/PFSH Details Patient Name: [name] Date of Service: 02/27/2018 9:15 AM Medical Record Number: [id number] Patient Account Number: [id number] Date of Birth/Sex: May 13, 1931 (84 y.o. M) Treating RN: Ahmed Prima Primary Care Provider: Burman Freestone  Other Clinician: Referring Provider: Burman Freestone Treating Provider/Extender: [name], Sadiq Mccauley Weeks in Treatment: 3 Information Obtained From Patient Wound History Do you currently have one or more open woundso Yes How many open wounds do you currently haveo 2 Approximately how long have you had your woundso 3 weeks Has your wound(s) ever healed and then re-openedo Yes Have you had any lab work done in the past montho Yes Have you tested positive for osteomyelitis (bone infection)o No Have you had any tests for circulation on your legso No Constitutional Symptoms (General Health) Complaints and Symptoms: Negative for: Fever; Chills Eyes Medical History: Positive for: Cataracts - bilateral removal Negative for: Glaucoma; Optic Neuritis Ear/Nose/Mouth/Throat Medical History: Negative for: Chronic  sinus problems/congestion; Middle ear problems Hematologic/Lymphatic Medical History: Negative for: Anemia; Hemophilia; Human Immunodeficiency Virus; Lymphedema; Sickle Cell Disease Respiratory Complaints and Symptoms: No Complaints or Symptoms Medical History: Positive for: Asthma Negative for: Aspiration; Chronic Obstructive Pulmonary Disease (COPD); Pneumothorax; Sleep Apnea; Tuberculosis Cardiovascular Complaints and Symptoms: No Complaints or Symptoms Medical History: Positive for: Angina; Arrhythmia; Coronary Artery Disease; Hypertension; Myocardial Infarction - 2001 Negative for: Congestive Heart Failure; Deep Vein Thrombosis; Hypotension; Peripheral Arterial Disease; Peripheral ADDEN, STROUT ([id number]) Venous Disease; Phlebitis; Vasculitis Gastrointestinal Medical History: Negative for: Cirrhosis ; Colitis; Crohnos; Hepatitis A; Hepatitis B; Hepatitis C Endocrine Medical History: Negative for: Type I Diabetes; Type II Diabetes Past Medical History Notes: Borderline Genitourinary Medical History: Negative for: End Stage Renal Disease Immunological Medical  History: Negative for: Lupus Erythematosus; Raynaudos; Scleroderma Integumentary (Skin) Medical History: Negative for: History of Burn; History of pressure wounds Musculoskeletal Medical History: Positive for: Osteoarthritis Negative for: Gout; Rheumatoid Arthritis; Osteomyelitis Neurologic Medical History: Positive for: Dementia; Neuropathy Negative for: Quadriplegia; Paraplegia; Seizure Disorder Oncologic Medical History: Negative for: Received Chemotherapy; Received Radiation Past Medical History Notes: Melanoma on back Psychiatric Complaints and Symptoms: No Complaints or Symptoms Medical History: Negative for: Anorexia/bulimia; Confinement Anxiety HBO Extended History Items Eyes: Cataracts SULLY, DYMENT ([id number]) Immunizations Pneumococcal Vaccine: Received Pneumococcal Vaccination: Yes Implantable Devices Hospitalization / Surgery History Name of Hospital Purpose of Hospitalization/Surgery Date [facility] Fall 01/20/2018 Family and Social History Cancer: Yes - Paternal Grandparents; Diabetes: Yes - Father; Heart Disease: Yes - Mother,Father; Hypertension: No; Kidney Disease: No; Lung Disease: No; Seizures: No; Stroke: Yes - Father; Thyroid Problems: No; Tuberculosis: No; Never smoker; Marital Status - Widowed; Alcohol Use: Never; Drug Use: No History; Caffeine Use: Daily; Financial Concerns: No; Food, Clothing or Shelter Needs: No; Support System Lacking: No; Transportation Concerns: No; Advanced Directives: Yes (Not Provided); Patient does not want information on Advanced Directives; Do not resuscitate: Yes (Not Provided); Living Will: Yes (Not Provided); Medical Power of Attorney: Yes - Kacy Conely (Not Provided) Physician Affirmation I have reviewed and agree with the above information. Electronic Signature(s) Signed: 02/28/2018 12:50:15 AM By: Worthy Keeler PA-C Signed: 02/28/2018 4:20:38 PM By: Alric Quan Entered By: Worthy Keeler on 02/27/2018  23:48:41 [name] ([id number]) -------------------------------------------------------------------------------- SuperBill Details Patient Name: [name] Date of Service: 02/27/2018 Medical Record Number: [id number] Patient Account Number: [id number] Date of Birth/Sex: May 16, 1931 (84 y.o. M) Treating RN: Ahmed Prima Primary Care Provider: Burman Freestone Other Clinician: Referring Provider: Burman Freestone Treating Provider/Extender: [name], Jacquelinne Speak Weeks in Treatment: 3 Diagnosis Coding ICD-10 Codes Code Description L89.154 Pressure ulcer of sacral region, stage 4 L24.0 Irritant contact dermatitis due to detergents Facility Procedures CPT4 Code: [id number] Description: 67893 - CHEM CAUT GRANULATION TISS ICD-10 Diagnosis Description L89.154 Pressure ulcer of sacral region, stage 4 Modifier: Quantity: 1 Physician Procedures CPT4 Code: 8101751 Description: 02585 - WC PHYS LEVEL 4 - EST PT ICD-10 Diagnosis Description L89.154 Pressure ulcer of sacral region, stage 4 L24.0 Irritant contact dermatitis due to detergents Modifier: 25 Quantity: 1 CPT4 Code: [id number] Description: 35361 - WC PHYS CHEM CAUT GRAN TISSUE ICD-10 Diagnosis Description L89.154 Pressure ulcer of sacral region, stage 4 Modifier: Quantity: 1 Electronic Signature(s) Signed: 02/28/2018 12:50:15 AM By: Worthy Keeler PA-C Entered By: Worthy Keeler on 02/27/2018 23:49:40

## 2018-03-06 ENCOUNTER — Encounter: Payer: Medicare Other | Admitting: Physician Assistant

## 2018-03-06 DIAGNOSIS — L89154 Pressure ulcer of sacral region, stage 4: Secondary | ICD-10-CM | POA: Diagnosis not present

## 2018-03-07 NOTE — Progress Notes (Signed)
MAXXIMUS, GOTAY ([id number]) Visit Report for 03/06/2018 Chief Complaint Document Details Patient Name: [name], [name]. Date of Service: 03/06/2018 9:15 AM Medical Record Number: [id number] Patient Account Number: [id number] Date of Birth/Sex: January 10, 1931 (84 y.o. M) Treating RN: Cornell Barman Primary Care Provider: Burman Freestone Other Clinician: Referring Provider: Burman Freestone Treating Provider/Extender: [name], HOYT Weeks in Treatment: 4 Information Obtained from: Patient Chief Complaint Sacral pressure ulcer and left shoulder rash Electronic Signature(s) Signed: 03/06/2018 6:53:41 PM By: Worthy Keeler PA-C Entered By: Worthy Keeler on 03/06/2018 09:23:42 [name] ([id number]) -------------------------------------------------------------------------------- HPI Details Patient Name: [name] Date of Service: 03/06/2018 9:15 AM Medical Record Number: [id number] Patient Account Number: [id number] Date of Birth/Sex: December 03, 1930 (84 y.o. M) Treating RN: Cornell Barman Primary Care Provider: Burman Freestone Other Clinician: Referring Provider: Burman Freestone Treating Provider/Extender: [name], HOYT Weeks in Treatment: 4 History of Present Illness HPI Description: 02/06/18 on evaluation today patient presents for initial evaluation and our clinic concerning an issue which began roughly 3 weeks ago when the patient fell in his home on the floor in his kitchen and laid him down this detergent for roughly 3 days. He had a pressure injury to the left shoulder. This unfortunately has caused him a lot of discomfort although it finally seems to be doing better if anything is really having a lot of itching right now. This appears potentially be a contact dermatitis issue. He also has a significant pressure injury to the sacrum at this time as well which is also showing fascia exposure right over the bone but no evidence of bone exposure at this point which is good news.  They have been using Santyl as well as Saline soaked gauze at this point in time. There does appear to be a lot of necrotic slough in the base of the wound. He does have a history of incontinence, myocardial infarction, and hypertension. He also is "borderline diabetic" hemoglobin A1c of 6.0. Currently he has some discomfort in the pressure site at the sacrum but fortunately nothing too significant this did require sharp debridement today. 02/13/18 on evaluation today patient appears to be doing much better in regard to his sacral wound. He has been tolerating the dressing changes without complication with the Vashe. Fortunately there is no evidence of infection and though there is some Slough on the surface of the wound bed he has excellent granulation noted. Overall I'm pleased with how things have progressed in that regard. A glance at his shoulder as well and the rash seems to be someone improving in my pinion at this site as well. Overall I am pleased with what we're seeing and so is the family. 02/20/18 on evaluation today patient appears to be doing a little worse in regard to the sacral wound only in the fact that there is redness surrounding it has me somewhat concerned for infection. The drainage has also apparently been a little bit off color compared to normal according to family they did keep the dressing today that was removed to show me and I agree this seems to be a little bit different compared to what we have been seeing. Coupled with the redness I'm concerned he may be developing some cellulitis surrounding the wound bed. 02/27/18 on evaluation today patient presents for follow-up concerning his sacral ulcer. We have received the results back from his wound culture which shows unfortunately that the doxycycline will not be of benefit for him I am going to need to initiate  treatment with something else in order to treat the pseudomonas. Otherwise he does not seem to be having any  significant pain although his daughter states there are sometimes when he states having pain. We continue to use the Vashe currently. 03/06/18 on evaluation today patient's sacral wound appears to be doing better in my opinion. He has been tolerating the dressing changes without complication. With that being said the silver nitrate has helped with the prominent area of hyper granulation at the 6 o'clock location we will likely need to repeat this again today. Nonetheless overall I am pleased with how things have improved over the last week. The erythema surrounding the wound seems to be greatly improved. Electronic Signature(s) Signed: 03/06/2018 6:53:41 PM By: Worthy Keeler PA-C Entered By: Worthy Keeler on 03/06/2018 10:01:55 [name] ([id number]) -------------------------------------------------------------------------------- Ot[facility] Sergeant TISS Details Patient Name: [name], [name] Date of Service: 03/06/2018 9:15 AM Medical Record Number: [id number] Patient Account Number: [id number] Date of Birth/Sex: [birth year] (84 y.o. M) Treating RN: Cornell Barman Primary Care Provider: Burman Freestone Other Clinician: Referring Provider: Burman Freestone Treating Provider/Extender: [name], HOYT Weeks in Treatment: 4 Procedure Performed for: Wound #1 Sacrum Performed By: Physician STONE III, HOYT E., PA-C Post Procedure Diagnosis Same as Pre-procedure Notes silver nitrate stick used Electronic Signature(s) Signed: 03/06/2018 5:25:47 PM By: Gretta Cool, BSN, RN, CWS, Kim RN, BSN Entered By: Gretta Cool, BSN, RN, CWS, Kim on 03/06/2018 09:48:28 [name] ([id number]) -------------------------------------------------------------------------------- Physical Exam Details Patient Name: [name], [name] Date of Service: 03/06/2018 9:15 AM Medical Record Number: [id number] Patient Account Number: [id number] Date of Birth/Sex: 08/06/1931 (84 y.o. M) Treating RN: Cornell Barman Primary Care  Provider: Burman Freestone Other Clinician: Referring Provider: Burman Freestone Treating Provider/Extender: [name], HOYT Weeks in Treatment: 4 Constitutional Well-nourished and well-hydrated in no acute distress. Respiratory normal breathing without difficulty. clear to auscultation bilaterally. Cardiovascular regular rate and rhythm with normal S1, S2. Psychiatric this patient is able to make decisions and demonstrates good insight into disease process. Alert and Oriented x 3. pleasant and cooperative. Notes At this point patient's wound did not have any significant adherent slough and debridement was not necessary currently. I did have to chemically cauterize the hyper granular tissue at the 6 o'clock location in a small area which he tolerated without significant pain and post cauterization the remaining portion of the wound that appears to be doing excellent. Patient shoulder shows no evidence of significant infection bacterial or fungal. I think at this point this is just a chronic inflammation that will just take time to resolve as result of the detergent. I would recommend some topical moisturizer cream but otherwise nothing further necessary at this point. Electronic Signature(s) Signed: 03/06/2018 6:53:41 PM By: Worthy Keeler PA-C Entered By: Worthy Keeler on 03/06/2018 10:07:23 [name] ([id number]) -------------------------------------------------------------------------------- Physician Orders Details Patient Name: [name], [name] Date of Service: 03/06/2018 9:15 AM Medical Record Number: [id number] Patient Account Number: [id number] Date of Birth/Sex: 23-Jul-1931 (84 y.o. M) Treating RN: Cornell Barman Primary Care Provider: Burman Freestone Other Clinician: Referring Provider: Burman Freestone Treating Provider/Extender: [name], HOYT Weeks in Treatment: 4 Verbal / Phone Orders: No Diagnosis Coding ICD-10 Coding Code Description L89.154 Pressure ulcer  of sacral region, stage 4 L24.0 Irritant contact dermatitis due to detergents Wound Cleansing Wound #1 Sacrum o Clean wound with Normal Saline. Anesthetic (add to Medication List) Wound #1 Sacrum o Topical Lidocaine 4% cream applied to wound bed prior to debridement (In Clinic  Only). Skin Barriers/Peri-Wound Care Wound #1 Sacrum o Barrier cream Primary Wound Dressing Wound #1 Sacrum o Other: - 3 inch conform soaked in Vashe or Dakins and packed into wound Secondary Dressing Wound #1 Sacrum o ABD pad o Dry Gauze o Other - Drawtex or equal Dressing Change Frequency Wound #1 Sacrum o Change dressing every day. o Change Dressing Monday, Wednesday, Friday - NPWT when applied Follow-up Appointments Wound #1 Sacrum o Return Appointment in 1 week. Off-Loading Wound #1 Sacrum o Turn and reposition every 2 hours [name], [name] ([id number]) [place] #1 [place] Visits - [facility]- Please order NPWT. If wound center needs to order, please call Kim @ [place] may visit PRN to address patientos wound care needs. o FACE TO FACE ENCOUNTER: MEDICARE and MEDICAID PATIENTS: I certify that this patient is under my care and that I had a face-to-face encounter that meets the physician face-to-face encounter requirements with this patient on this date. The encounter with the patient was in whole or in part for the following MEDICAL CONDITION: (primary reason for [place]) MEDICAL NECESSITY: I certify, that based on my findings, NURSING services are a medically necessary home health service. HOME BOUND STATUS: I certify that my clinical findings support that this patient is homebound (i.e., Due to illness or injury, pt requires aid of supportive devices such as crutches, cane, wheelchairs, walkers, the use of special transportation or the assistance of another person to leave their place of residence. There is  a normal inability to leave the home and doing so requires considerable and taxing effort. Other absences are for medical reasons / religious services and are infrequent or of short duration when for other reasons). o If current dressing causes regression in wound condition, may D/C ordered dressing product/s and apply Normal Saline Moist Dressing daily until next [place] / Other MD appointment. [place] of regression in wound condition at [phone]. o Please direct any NON-WOUND related issues/requests for orders to patient's Primary Care Physician Negative Pressure Wound Therapy Wound #1 Sacrum o Wound VAC settings at 125/130 mmHg continuous pressure. Use BLACK/GREEN foam to wound cavity. Use WHITE foam to fill any tunnel/s and/or undermining. Change VAC dressing 3 X WEEK. Change canister as indicated when full. Nurse may titrate settings and frequency of dressing changes as clinically indicated. o Home Health Nurse may d/c VAC for s/s of increased infection, significant wound regression, or uncontrolled drainage. [place] at [phone]. Medications-please add to medication list. Wound #1 Sacrum o Other: - TAC on back and shoulder Electronic Signature(s) Signed: 03/06/2018 5:25:47 PM By: Gretta Cool, BSN, RN, CWS, Kim RN, BSN Signed: 03/06/2018 6:53:41 PM By: Worthy Keeler PA-C Entered By: Gretta Cool, BSN, RN, CWS, Kim on 03/06/2018 10:13:33 [name] ([id number]) -------------------------------------------------------------------------------- Problem List Details Patient Name: BOSTYN, BOGIE Date of Service: 03/06/2018 9:15 AM Medical Record Number: [id number] Patient Account Number: [id number] Date of Birth/Sex: 1930-11-19 (84 y.o. M) Treating RN: Cornell Barman Primary Care Provider: Burman Freestone Other Clinician: Referring Provider: Burman Freestone Treating Provider/Extender: [name], HOYT Weeks in Treatment:  4 Active Problems ICD-10 Impacting Encounter Code Description Active Date Wound Healing Diagnosis L89.154 Pressure ulcer of sacral region, stage 4 02/06/2018 Yes L24.0 Irritant contact dermatitis due to detergents 02/06/2018 Yes Inactive Problems Resolved Problems Electronic Signature(s) Signed: 03/06/2018 6:53:41 PM By: Worthy Keeler PA-C Entered By: Worthy Keeler on 03/06/2018 09:23:34 [name] ([id number]) --------------------------------------------------------------------------------  Progress Note Details Patient Name: JAVONTAE, MARLETTE. Date of Service: 03/06/2018 9:15 AM Medical Record Number: [id number] Patient Account Number: [id number] Date of Birth/Sex: [birth year] (84 y.o. M) Treating RN: Cornell Barman Primary Care Provider: Burman Freestone Other Clinician: Referring Provider: Burman Freestone Treating Provider/Extender: [name], HOYT Weeks in Treatment: 4 Subjective Chief Complaint Information obtained from Patient Sacral pressure ulcer and left shoulder rash History of Present Illness (HPI) 02/06/18 on evaluation today patient presents for initial evaluation and our clinic concerning an issue which began roughly 3 weeks ago when the patient fell in his home on the floor in his kitchen and laid him down this detergent for roughly 3 days. He had a pressure injury to the left shoulder. This unfortunately has caused him a lot of discomfort although it finally seems to be doing better if anything is really having a lot of itching right now. This appears potentially be a contact dermatitis issue. He also has a significant pressure injury to the sacrum at this time as well which is also showing fascia exposure right over the bone but no evidence of bone exposure at this point which is good news. They have been using Santyl as well as Saline soaked gauze at this point in time. There does appear to be a lot of necrotic slough in the base of the wound. He does have  a history of incontinence, myocardial infarction, and hypertension. He also is "borderline diabetic" hemoglobin A1c of 6.0. Currently he has some discomfort in the pressure site at the sacrum but fortunately nothing too significant this did require sharp debridement today. 02/13/18 on evaluation today patient appears to be doing much better in regard to his sacral wound. He has been tolerating the dressing changes without complication with the Vashe. Fortunately there is no evidence of infection and though there is some Slough on the surface of the wound bed he has excellent granulation noted. Overall I'm pleased with how things have progressed in that regard. A glance at his shoulder as well and the rash seems to be someone improving in my pinion at this site as well. Overall I am pleased with what we're seeing and so is the family. 02/20/18 on evaluation today patient appears to be doing a little worse in regard to the sacral wound only in the fact that there is redness surrounding it has me somewhat concerned for infection. The drainage has also apparently been a little bit off color compared to normal according to family they did keep the dressing today that was removed to show me and I agree this seems to be a little bit different compared to what we have been seeing. Coupled with the redness I'm concerned he may be developing some cellulitis surrounding the wound bed. 02/27/18 on evaluation today patient presents for follow-up concerning his sacral ulcer. We have received the results back from his wound culture which shows unfortunately that the doxycycline will not be of benefit for him I am going to need to initiate treatment with something else in order to treat the pseudomonas. Otherwise he does not seem to be having any significant pain although his daughter states there are sometimes when he states having pain. We continue to use the Vashe currently. 03/06/18 on evaluation today patient's  sacral wound appears to be doing better in my opinion. He has been tolerating the dressing changes without complication. With that being said the silver nitrate has helped with the prominent area of hyper granulation at the 6 o'clock  location we will likely need to repeat this again today. Nonetheless overall I am pleased with how things have improved over the last week. The erythema surrounding the wound seems to be greatly improved. Patient History Information obtained from Patient. Family History Cancer - Paternal Grandparents, Diabetes - Father, Heart Disease - Mother,Father, Stroke - Father, No family history of Hypertension, Kidney Disease, Lung Disease, Seizures, Thyroid Problems, Tuberculosis. [name], [name] ([id number]) Social History Never smoker, Marital Status - Widowed, Alcohol Use - Never, Drug Use - No History, Caffeine Use - Daily. Medical History Hospitalization/Surgery History - 01/20/2018, [facility], [place]. 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 Respiratory The patient has no complaints or symptoms. Cardiovascular The patient has no complaints or symptoms. Psychiatric The patient has no complaints or symptoms. Objective Constitutional Well-nourished and well-hydrated in no acute distress. Vitals Time Taken: 9:28 AM, Height: 69 in, Weight: 155 lbs, BMI: 22.9, Temperature: 98.2 F, Pulse: 57 bpm, Respiratory Rate: 16 breaths/min, Blood Pressure: 130/79 mmHg. Respiratory normal breathing without difficulty. clear to auscultation bilaterally. Cardiovascular regular rate and rhythm with normal S1, S2. Psychiatric this patient is able to make decisions and demonstrates good insight into disease process. Alert and Oriented x 3. pleasant and cooperative. General Notes: At this point patient's wound did not have any  significant adherent slough and debridement was not necessary currently. I did have to chemically cauterize the hyper granular tissue at the 6 o'clock location in a small area which he tolerated without significant pain and post cauterization the remaining portion of the wound that appears to be doing excellent. Patient shoulder shows no evidence of significant infection bacterial or fungal. I think at this point this is just a chronic inflammation that will just take time to resolve as result of the detergent. I would recommend some topical moisturizer cream but otherwise nothing further necessary at this point. Integumentary (Hair, Skin) [name], [name]. ([id number]) Wound #1 status is Open. Original cause of wound was Pressure Injury. The wound is located on the Sacrum. The wound measures 4.5cm length x 4.2cm width x 1.7cm depth; 14.844cm^2 area and 25.235cm^3 volume. There is Fat Layer (Subcutaneous Tissue) Exposed exposed. There is undermining starting at 10:00 and ending at 12:00 with a maximum distance of 0.9cm. There is additional undermining and at 1:00 and ending at 5:00 with a maximum distance of 2.2cm. There is a large amount of purulent drainage noted. The wound margin is distinct with the outline attached to the wound base. There is large (67-100%) red, hyper - granulation within the wound bed. There is a small (1-33%) amount of necrotic tissue within the wound bed including Adherent Slough. The periwound skin appearance exhibited: Excoriation, Erythema. The periwound skin appearance did not exhibit: Callus, Crepitus, Induration, Rash, Scarring, Dry/Scaly, Maceration, Atrophie Blanche, Cyanosis, Ecchymosis, Hemosiderin Staining, Mottled, Pallor, Rubor. The surrounding wound skin color is noted with erythema which is circumferential. Periwound temperature was noted as No Abnormality. The periwound has tenderness on palpation. Assessment Active Problems ICD-10 L89.154 - Pressure  ulcer of sacral region, stage 4 L24.0 - Irritant contact dermatitis due to detergents Procedures Wound #1 Pre-procedure diagnosis of Wound #1 is a Pressure Ulcer located on the Sacrum . An CHEM CAUT GRANULATION TISS procedure was performed by STONE III, HOYT E., PA-C. Post procedure Diagnosis Wound #1: Same as Pre-Procedure Notes: silver nitrate stick used Plan Wound Cleansing: Wound #1 Sacrum: Clean  wound with Normal Saline. Anesthetic (add to Medication List): Wound #1 Sacrum: Topical Lidocaine 4% cream applied to wound bed prior to debridement (In Clinic Only). Skin Barriers/Peri-Wound Care: Wound #1 Sacrum: Barrier cream Primary Wound Dressing: Wound #1 Sacrum: Other: - 3 inch conform soaked in Vashe or Dakins and packed into wound Secondary Dressing: Wound #1 Sacrum: ABD pad Dry Gauze [name], [name] ([id number]) Other - Drawtex or equal Dressing Change Frequency: Wound #1 Sacrum: Change dressing every day. Follow-up Appointments: Wound #1 Sacrum: Return Appointment in 1 week. Off-Loading: Wound #1 Sacrum: Turn and reposition every 2 hours Home Health: Wound #1 Sacrum: Continue Home Health Visits - [facility]- Please order NPWT. If wound center needs to order, please call Kim @ [place] Nurse may visit PRN to address patient s wound care needs. FACE TO FACE ENCOUNTER: MEDICARE and MEDICAID PATIENTS: I certify that this patient is under my care and that I had a face-to-face encounter that meets the physician face-to-face encounter requirements with this patient on this date. The encounter with the patient was in whole or in part for the following MEDICAL CONDITION: (primary reason for [place]) MEDICAL NECESSITY: I certify, that based on my findings, NURSING services are a medically necessary home health service. HOME BOUND STATUS: I certify that my clinical findings support that this patient is homebound (i.e., Due to illness or injury, pt  requires aid of supportive devices such as crutches, cane, wheelchairs, walkers, the use of special transportation or the assistance of another person to leave their place of residence. There is a normal inability to leave the home and doing so requires considerable and taxing effort. Other absences are for medical reasons / religious services and are infrequent or of short duration when for other reasons). If current dressing causes regression in wound condition, may D/C ordered dressing product/s and apply Normal Saline Moist Dressing daily until next [place] / Other MD appointment. [place] of regression in wound condition at [phone]. Please direct any NON-WOUND related issues/requests for orders to patient's Primary Care Physician Negative Pressure Wound Therapy: Wound #1 Sacrum: Wound VAC settings at 125/130 mmHg continuous pressure. Use BLACK/GREEN foam to wound cavity. Use WHITE foam to fill any tunnel/s and/or undermining. Change VAC dressing 2 X WEEK. Change canister as indicated when full. Nurse may titrate settings and frequency of dressing changes as clinically indicated. Home Health Nurse may d/c VAC for s/s of increased infection, significant wound regression, or uncontrolled drainage. [place] at [phone]. Medications-please add to medication list.: Wound #1 Sacrum: Other: - TAC on back and shoulder Currently I'm gonna suggest that we continue with the Current wound care measures until we can get the approval and initiation of the wound VAC. Patient's daughter is in agreement with this plan. Subsequently we're gonna see him for reevaluation in one weeks time to see were things stand. If the inflammation surrounding the wound does not seem to continue to improve, which it has up to this point, then we may need to consider referral for IV antibiotic therapy although I'm hopeful that the [facility] will do the job we may need  to extend this a little longer however. I will see how things appear again in one weeks time. Please see above for specific wound care orders. We will see patient for re-evaluation in 1 week(s) here in the clinic. If anything worsens or changes patient will contact our office for additional recommendations. Electronic Signature(s) Signed: 03/06/2018  6:53:41 PM By: Worthy Keeler PA-C Entered By: Worthy Keeler on 03/06/2018 10:08:03 [name] ([id number]) -------------------------------------------------------------------------------- ROS/PFSH Details Patient Name: [name] Date of Service: 03/06/2018 9:15 AM Medical Record Number: [id number] Patient Account Number: [id number] Date of Birth/Sex: Mar 09, 1931 (84 y.o. M) Treating RN: Cornell Barman Primary Care Provider: Burman Freestone Other Clinician: Referring Provider: Burman Freestone Treating Provider/Extender: [name], HOYT Weeks in Treatment: 4 Information Obtained From Patient Wound History Do you currently have one or more open woundso Yes How many open wounds do you currently haveo 2 Approximately how long have you had your woundso 3 weeks Has your wound(s) ever healed and then re-openedo Yes Have you had any lab work done in the past montho Yes Have you tested positive for osteomyelitis (bone infection)o No Have you had any tests for circulation on your legso No Constitutional Symptoms (General Health) Complaints and Symptoms: Negative for: Fever; Chills Eyes Medical History: Positive for: Cataracts - bilateral removal Negative for: Glaucoma; Optic Neuritis Ear/Nose/Mouth/Throat Medical History: Negative for: Chronic sinus problems/congestion; Middle ear problems Hematologic/Lymphatic Medical History: Negative for: Anemia; Hemophilia; Human Immunodeficiency Virus; Lymphedema; Sickle Cell Disease Respiratory Complaints and Symptoms: No Complaints or Symptoms Medical History: Positive for:  Asthma Negative for: Aspiration; Chronic Obstructive Pulmonary Disease (COPD); Pneumothorax; Sleep Apnea; Tuberculosis Cardiovascular Complaints and Symptoms: No Complaints or Symptoms Medical History: Positive for: Angina; Arrhythmia; Coronary Artery Disease; Hypertension; Myocardial Infarction - 2001 Negative for: Congestive Heart Failure; Deep Vein Thrombosis; Hypotension; Peripheral Arterial Disease; Peripheral [name], [name] ([id number]) Venous Disease; Phlebitis; Vasculitis Gastrointestinal Medical History: Negative for: Cirrhosis ; Colitis; Crohnos; Hepatitis A; Hepatitis B; Hepatitis C Endocrine Medical History: Negative for: Type I Diabetes; Type II Diabetes Past Medical History Notes: Borderline Genitourinary Medical History: Negative for: End Stage Renal Disease Immunological Medical History: Negative for: Lupus Erythematosus; Raynaudos; Scleroderma Integumentary (Skin) Medical History: Negative for: History of Burn; History of pressure wounds Musculoskeletal Medical History: Positive for: Osteoarthritis Negative for: Gout; Rheumatoid Arthritis; Osteomyelitis Neurologic Medical History: Positive for: Dementia; Neuropathy Negative for: Quadriplegia; Paraplegia; Seizure Disorder Oncologic Medical History: Negative for: Received Chemotherapy; Received Radiation Past Medical History Notes: Melanoma on back Psychiatric Complaints and Symptoms: No Complaints or Symptoms Medical History: Negative for: Anorexia/bulimia; Confinement Anxiety HBO Extended History Items Eyes: Cataracts [name], [name] ([id number]) Immunizations Pneumococcal Vaccine: Received Pneumococcal Vaccination: Yes Implantable Devices Hospitalization / Surgery History Name of Hospital Purpose of Hospitalization/Surgery Date [facility] Fall 01/20/2018 Family and Social History Cancer: Yes - Paternal Grandparents; Diabetes: Yes - Father; Heart Disease: Yes - Mother,Father; Hypertension: No;  Kidney Disease: No; Lung Disease: No; Seizures: No; Stroke: Yes - Father; Thyroid Problems: No; Tuberculosis: No; Never smoker; Marital Status - Widowed; Alcohol Use: Never; Drug Use: No History; Caffeine Use: Daily; Financial Concerns: No; Food, Clothing or Shelter Needs: No; Support System Lacking: No; Transportation Concerns: No; Advanced Directives: Yes (Not Provided); Patient does not want information on Advanced Directives; Do not resuscitate: Yes (Not Provided); Living Will: Yes (Not Provided); Medical Power of Attorney: Yes - Fed Ceci (Not Provided) Physician Affirmation I have reviewed and agree with the above information. Electronic Signature(s) Signed: 03/06/2018 5:25:47 PM By: Gretta Cool, BSN, RN, CWS, Kim RN, BSN Signed: 03/06/2018 6:53:41 PM By: Worthy Keeler PA-C Entered By: Worthy Keeler on 03/06/2018 10:05:16 [name], [name] ([id number]) -------------------------------------------------------------------------------- SuperBill Details Patient Name: [name], OVERHOLSER. Date of Service: 03/06/2018 Medical Record Number: [id number] Patient Account Number: [id number] Date of Birth/Sex: 11-05-1930 (84 y.o. M) Treating RN: Cornell Barman Primary  Care Provider: Burman Freestone Other Clinician: Referring Provider: Burman Freestone Treating Provider/Extender: [name], HOYT Weeks in Treatment: 4 Diagnosis Coding ICD-10 Codes Code Description L89.154 Pressure ulcer of sacral region, stage 4 L24.0 Irritant contact dermatitis due to detergents Facility Procedures CPT4 Code: [id number] Description: 75436 - CHEM CAUT GRANULATION TISS ICD-10 Diagnosis Description L89.154 Pressure ulcer of sacral region, stage 4 Modifier: Quantity: 1 Physician Procedures CPT4 Code: 0677034 Description: 03524 - WC PHYS CHEM CAUT GRAN TISSUE ICD-10 Diagnosis Description L89.154 Pressure ulcer of sacral region, stage 4 Modifier: Quantity: 1 Electronic Signature(s) Signed: 03/06/2018 6:53:41 PM By: Worthy Keeler PA-C Entered By: Worthy Keeler on 03/06/2018 10:08:17

## 2018-03-08 NOTE — Progress Notes (Signed)
AYVIN, LIPINSKI ([id number]) Visit Report for 03/06/2018 Arrival Information Details Patient Name: [name], [name]. Date of Service: 03/06/2018 9:15 AM Medical Record Number: [id number] Patient Account Number: [id number] Date of Birth/Sex: 1931-03-13 (84 y.o. M) Treating RN: Roger Shelter Primary Care Joby Richart: Burman Freestone Other Clinician: Referring Jamesia Linnen: Burman Freestone Treating Jhan Conery/Extender: [name], HOYT Weeks in Treatment: 4 Visit Information History Since Last Visit All ordered tests and consults were completed: No Patient Arrived: Cane Added or deleted any medications: No Arrival Time: 09:23 Any new allergies or adverse reactions: No Accompanied By: daughter Had a fall or experienced change in No Transfer Assistance: None activities of daily living that may affect Patient Identification Verified: Yes risk of falls: Secondary Verification Process Completed: Yes Signs or symptoms of abuse/neglect since last visito No Patient Requires Transmission-Based Precautions: No Hospitalized since last visit: No Patient Has Alerts: Yes Implantable device outside of the clinic excluding No cellular tissue based products placed in the center since last visit: Pain Present Now: Yes Electronic Signature(s) Signed: 03/07/2018 7:52:46 AM By: Roger Shelter Entered By: Roger Shelter on 03/06/2018 09:27:14 [name] ([id number]) -------------------------------------------------------------------------------- Encounter Discharge Information Details Patient Name: [name] Date of Service: 03/06/2018 9:15 AM Medical Record Number: [id number] Patient Account Number: [id number] Date of Birth/Sex: 01/22/1931 (84 y.o. M) Treating RN: Roger Shelter Primary Care Demere Dotzler: Burman Freestone Other Clinician: Referring Chakia Counts: Burman Freestone Treating Simi Briel/Extender: [name], HOYT Weeks in Treatment: 4 Encounter Discharge Information Items Discharge  Condition: Stable Ambulatory Status: Cane Discharge Destination: Home Transportation: Private Auto Accompanied By: daughter Schedule Follow-up Appointment: Yes Clinical Summary of Care: Electronic Signature(s) Signed: 03/07/2018 7:52:46 AM By: Roger Shelter Entered By: Roger Shelter on 03/06/2018 10:03:54 [name] ([id number]) -------------------------------------------------------------------------------- Lower Extremity Assessment Details Patient Name: [name] Date of Service: 03/06/2018 9:15 AM Medical Record Number: [id number] Patient Account Number: [id number] Date of Birth/Sex: 1931-08-08 (84 y.o. M) Treating RN: Roger Shelter Primary Care Buford Gayler: Burman Freestone Other Clinician: Referring Ryanne Morand: Burman Freestone Treating Mystie Ormand/Extender: [name], HOYT Weeks in Treatment: 4 Electronic Signature(s) Signed: 03/07/2018 7:52:46 AM By: Roger Shelter Entered By: Roger Shelter on 03/06/2018 09:35:54 [name] ([id number]) -------------------------------------------------------------------------------- Multi Wound Chart Details Patient Name: [name] Date of Service: 03/06/2018 9:15 AM Medical Record Number: [id number] Patient Account Number: [id number] Date of Birth/Sex: 05-03-1931 (84 y.o. M) Treating RN: Cornell Barman Primary Care Tarah Buboltz: Burman Freestone Other Clinician: Referring Jaquay Morneault: Burman Freestone Treating Chela Sutphen/Extender: [name], HOYT Weeks in Treatment: 4 Vital Signs Height(in): 69 Pulse(bpm): 21 Weight(lbs): 155 Blood Pressure(mmHg): 130/79 Body Mass Index(BMI): 23 Temperature(F): 98.2 Respiratory Rate 16 (breaths/min): Photos: [1:No Photos] [N/A:N/A] Wound Location: [1:Sacrum] [N/A:N/A] Wounding Event: [1:Pressure Injury] [N/A:N/A] Primary Etiology: [1:Pressure Ulcer] [N/A:N/A] Comorbid History: [1:Cataracts, Asthma, Angina, Arrhythmia, Coronary Artery Disease, Hypertension, Myocardial  Infarction, Osteoarthritis, Dementia, Neuropathy] [N/A:N/A] Date Acquired: [1:01/17/2018] [N/A:N/A] Weeks of Treatment: [1:4] [N/A:N/A] Wound Status: [1:Open] [N/A:N/A] Measurements L x W x D [1:4.5x4.2x1.7] [N/A:N/A] (cm) Area (cm) : [1:14.844] [N/A:N/A] Volume (cm) : [1:25.235] [N/A:N/A] % Reduction in Area: [1:27.30%] [N/A:N/A] % Reduction in Volume: [1:38.20%] [N/A:N/A] Starting Position 1 [1:10] (o'clock): Ending Position 1 [1:12] (o'clock): Maximum Distance 1 (cm): [1:0.9] Starting Position 2 [1:1] (o'clock): Ending Position 2 [1:5] (o'clock): Maximum Distance 2 (cm): [1:2.2] Undermining: [1:Yes] [N/A:N/A] Classification: [1:Category/Stage III] [N/A:N/A] Exudate Amount: [1:Large] [N/A:N/A] Exudate Type: [1:Purulent] [N/A:N/A] Exudate Color: [1:yellow, brown, green] [N/A:N/A] Wound Margin: [1:Distinct, outline attached] [N/A:N/A] Granulation Amount: [1:Large (67-100%)] [N/A:N/A] Granulation Quality: [1:Red, Hyper-granulation] [N/A:N/A] Necrotic Amount: Small (1-33%) N/A N/A Exposed  Structures: Fat Layer (Subcutaneous N/A N/A Tissue) Exposed: Yes Fascia: No Tendon: No Muscle: No Joint: No Bone: No Epithelialization: None N/A N/A Periwound Skin Texture: Excoriation: Yes N/A N/A Induration: No Callus: No Crepitus: No Rash: No Scarring: No Periwound Skin Moisture: Maceration: No N/A N/A Dry/Scaly: No Periwound Skin Color: Erythema: Yes N/A N/A Atrophie Blanche: No Cyanosis: No Ecchymosis: No Hemosiderin Staining: No Mottled: No Pallor: No Rubor: No Erythema Location: Circumferential N/A N/A Temperature: No Abnormality N/A N/A Tenderness on Palpation: Yes N/A N/A Wound Preparation: Ulcer Cleansing: N/A N/A Rinsed/Irrigated with Saline Topical Anesthetic Applied: Other: lidocaine 4% Treatment Notes Electronic Signature(s) Signed: 03/06/2018 5:25:47 PM By: Gretta Cool, BSN, RN, CWS, Kim RN, BSN Entered By: Gretta Cool, BSN, RN, CWS, Kim on 03/06/2018  09:46:04 [name] ([id number]) -------------------------------------------------------------------------------- Multi-Disciplinary Care Plan Details Patient Name: [name], [name]. Date of Service: 03/06/2018 9:15 AM Medical Record Number: [id number] Patient Account Number: [id number] Date of Birth/Sex: [birth year] (84 y.o. M) Treating RN: Cornell Barman Primary Care Lavelle Akel: Burman Freestone Other Clinician: Referring Lenward Able: Burman Freestone Treating Aysel Gilchrest/Extender: [name], HOYT Weeks in Treatment: 4 Active Inactive ` Abuse / Safety / Falls / Self Care Management Nursing Diagnoses: History of Falls Goals: Patient will not experience any injury related to falls Date Initiated: 02/06/2018 Target Resolution Date: 03/08/2018 Goal Status: Active Interventions: Assess fall risk on admission and as needed Treatment Activities: Patient referred to home care : 02/06/2018 Notes: ` Necrotic Tissue Nursing Diagnoses: Impaired tissue integrity related to necrotic/devitalized tissue Goals: Necrotic/devitalized tissue will be minimized in the wound bed Date Initiated: 02/06/2018 Target Resolution Date: 03/08/2018 Goal Status: Active Interventions: Assess patient pain level pre-, during and post procedure and prior to discharge Treatment Activities: Excisional debridement : 02/06/2018 Notes: ` Nutrition Nursing Diagnoses: Potential for alteratiion in Nutrition/Potential for imbalanced nutrition [name], [name] ([id number]) Goals: Patient/caregiver agrees to and verbalizes understanding of need to obtain nutritional consultation Date Initiated: 02/06/2018 Target Resolution Date: 03/08/2018 Goal Status: Active Interventions: Provide education on nutrition Notes: ` Orientation to the Wound Care Program Nursing Diagnoses: Knowledge deficit related to the wound healing center program Goals: Patient/caregiver will verbalize understanding of the [facility]  Program Date Initiated: 02/06/2018 Target Resolution Date: 03/08/2018 Goal Status: Active Interventions: Provide education on orientation to the wound center Notes: ` Pressure Nursing Diagnoses: Knowledge deficit related to management of pressures ulcers Goals: Patient/caregiver will verbalize understanding of pressure ulcer management Date Initiated: 02/06/2018 Target Resolution Date: 03/08/2018 Goal Status: Active Interventions: Assess offloading mechanisms upon admission and as needed Provide education on pressure ulcers Notes: ` Wound/Skin Impairment Nursing Diagnoses: Knowledge deficit related to ulceration/compromised skin integrity Goals: Ulcer/skin breakdown will have a volume reduction of 80% by week 12 Date Initiated: 02/06/2018 Target Resolution Date: 04/08/2018 Goal Status: Active [name], [name] ([id number]) Interventions: Assess ulceration(s) every visit Treatment Activities: Skin care regimen initiated : 02/06/2018 Topical wound management initiated : 02/06/2018 Notes: Electronic Signature(s) Signed: 03/06/2018 5:25:47 PM By: Gretta Cool, BSN, RN, CWS, Kim RN, BSN Entered By: Gretta Cool, BSN, RN, CWS, Kim on 03/06/2018 09:45:51 [name] ([id number]) -------------------------------------------------------------------------------- Pain Assessment Details Patient Name: [name], [name]. Date of Service: 03/06/2018 9:15 AM Medical Record Number: [id number] Patient Account Number: [id number] Date of Birth/Sex: [birth year] (84 y.o. M) Treating RN: Roger Shelter Primary Care Maico Mulvehill: Burman Freestone Other Clinician: Referring Myiah Petkus: Burman Freestone Treating Duc Crocket/Extender: [name], HOYT Weeks in Treatment: 4 Active Problems Location of Pain Severity and Description of Pain Patient Has Paino Yes Site Locations Pain Location: Pain in  Ulcers Duration of the Pain. Constant / Intermittento Constant Rate the pain. Current Pain Level: 10 Character of  Pain Describe the Pain: Shooting, Other: radiatin pain Pain Management and Medication Current Pain Management: Electronic Signature(s) Signed: 03/07/2018 7:52:46 AM By: Roger Shelter Entered By: Roger Shelter on 03/06/2018 09:28:09 [name] ([id number]) -------------------------------------------------------------------------------- Patient/Caregiver Education Details Patient Name: [name] Date of Service: 03/06/2018 9:15 AM Medical Record Number: [id number] Patient Account Number: [id number] Date of Birth/Gender: [birth year] (84 y.o. M) Treating RN: Roger Shelter Primary Care Physician: Burman Freestone Other Clinician: Referring Physician: Burman Freestone Treating Physician/Extender: Sharalyn Ink in Treatment: 4 Education Assessment Education Provided To: Patient and Caregiver daughter Education Topics Provided Wound Debridement: Handouts: Wound Debridement Methods: Explain/Verbal Responses: State content correctly Wound/Skin Impairment: Handouts: Caring for Your Ulcer Methods: Explain/Verbal Responses: State content correctly Electronic Signature(s) Signed: 03/07/2018 7:52:46 AM By: Roger Shelter Entered By: Roger Shelter on 03/06/2018 10:05:33 [name] ([id number]) -------------------------------------------------------------------------------- Wound Assessment Details Patient Name: [name] Date of Service: 03/06/2018 9:15 AM Medical Record Number: [id number] Patient Account Number: [id number] Date of Birth/Sex: 11-12-1930 (84 y.o. M) Treating RN: Roger Shelter Primary Care Aramis Weil: Burman Freestone Other Clinician: Referring Najma Bozarth: Burman Freestone Treating Ashanna Heinsohn/Extender: [name], HOYT Weeks in Treatment: 4 Wound Status Wound Number: 1 Primary Pressure Ulcer Etiology: Wound Location: Sacrum Wound Open Wounding Event: Pressure Injury Status: Date Acquired: 01/17/2018 Comorbid Cataracts, Asthma,  Angina, Arrhythmia, Weeks Of Treatment: 4 History: Coronary Artery Disease, Hypertension, Clustered Wound: No Myocardial Infarction, Osteoarthritis, Dementia, Neuropathy Photos Photo Uploaded By: Roger Shelter on 03/07/2018 16:11:13 Wound Measurements Length: (cm) 4.5 Width: (cm) 4.2 Depth: (cm) 1.7 Area: (cm) 14.844 Volume: (cm) 25.235 % Reduction in Area: 27.3% % Reduction in Volume: 38.2% Epithelialization: None Undermining: Yes Location 1 Starting Position (o'clock): 10 Ending Position (o'clock): 12 Maximum Distance: (cm) 0.9 Location 2 Starting Position (o'clock): 1 Ending Position (o'clock): 5 Maximum Distance: (cm) 2.2 Wound Description Classification: Category/Stage III Wound Margin: Distinct, outline attached Exudate Amount: Large Exudate Type: Purulent Exudate Color: yellow, brown, green Foul Odor After Cleansing: No Slough/Fibrino Yes Wound Bed [name], [name] ([id number]) Granulation Amount: Large (67-100%) Exposed Structure Granulation Quality: Red, Hyper-granulation Fascia Exposed: No Necrotic Amount: Small (1-33%) Fat Layer (Subcutaneous Tissue) Exposed: Yes Necrotic Quality: Adherent Slough Tendon Exposed: No Muscle Exposed: No Joint Exposed: No Bone Exposed: No Periwound Skin Texture Texture Color No Abnormalities Noted: No No Abnormalities Noted: No Callus: No Atrophie Blanche: No Crepitus: No Cyanosis: No Excoriation: Yes Ecchymosis: No Induration: No Erythema: Yes Rash: No Erythema Location: Circumferential Scarring: No Hemosiderin Staining: No Mottled: No Moisture Pallor: No No Abnormalities Noted: No Rubor: No Dry / Scaly: No Maceration: No Temperature / Pain Temperature: No Abnormality Tenderness on Palpation: Yes Wound Preparation Ulcer Cleansing: Rinsed/Irrigated with Saline Topical Anesthetic Applied: Other: lidocaine 4%, Treatment Notes Wound #1 (Sacrum) 1. Cleansed with: Clean wound with Normal  Saline 2. Anesthetic Topical Lidocaine 4% cream to wound bed prior to debridement 4. Dressing Applied: Other dressing (specify in notes) Notes Dakins soaked gauze lightly packed into wound, covered with Drawtex and ABD secured with paper tape. Electronic Signature(s) Signed: 03/07/2018 7:52:46 AM By: Roger Shelter Entered By: Roger Shelter on 03/06/2018 09:35:36 [name], [name] ([id number]) -------------------------------------------------------------------------------- Vitals Details Patient Name: [name] Date of Service: 03/06/2018 9:15 AM Medical Record Number: [id number] Patient Account Number: [id number] Date of Birth/Sex: 1931/06/22 (84 y.o. M) Treating RN: Roger Shelter Primary Care Preeya Cleckley: Burman Freestone Other Clinician: Referring Clare Fennimore: Burman Freestone Treating  Kloee Ballew/Extender: [name], HOYT Weeks in Treatment: 4 Vital Signs Time Taken: 09:28 Temperature (F): 98.2 Height (in): 69 Pulse (bpm): 57 Weight (lbs): 155 Respiratory Rate (breaths/min): 16 Body Mass Index (BMI): 22.9 Blood Pressure (mmHg): 130/79 Reference Range: 80 - 120 mg / dl Electronic Signature(s) Signed: 03/07/2018 7:52:46 AM By: Roger Shelter Entered By: Roger Shelter on 03/06/2018 09:28:43

## 2018-03-13 ENCOUNTER — Encounter: Payer: Medicare Other | Admitting: Physician Assistant

## 2018-03-13 ENCOUNTER — Telehealth: Payer: Self-pay | Admitting: Physician Assistant

## 2018-03-13 DIAGNOSIS — L89154 Pressure ulcer of sacral region, stage 4: Secondary | ICD-10-CM | POA: Diagnosis not present

## 2018-03-13 NOTE — Telephone Encounter (Signed)
Agree with evaluation in the ED given ongoing chest pain not relieved by NTG. Richardson Dopp, PA-C    03/13/2018 10:05 AM

## 2018-03-13 NOTE — Telephone Encounter (Signed)
Spoke with patient's daughter Helene Kelp) who has informed us that the patient is having 2+ pitting edema in bilateral legs and swelling in the left arm according to the home health nurse who is caring for the patient due to a wound on his back.    Over the past few days he has had CP.  This morning he had CP and took 3 nitros over time, with no relief.  He denies SOB, but I recommended that he go to the ED since he hasn't had relief from the [facility] and his cardiac hx.  They verbalized understanding.

## 2018-03-13 NOTE — Telephone Encounter (Signed)
New Message:        Pt c/o swelling: STAT is pt has developed SOB within 24 hours  1) How much weight have you gained and in what time span?   2) If swelling, where is the swelling located? Lower extremities  3) Are you currently taking a fluid pill?   4) Are you currently SOB? No  5) Do you have a log of your daily weights (if so, list)? No  6) Have you gained 3 pounds in a day or 5 pounds in a week?      Pt has been swelling over the last 4-5 days and is now starting to complain of CP.  7) Have you traveled recently?

## 2018-03-14 NOTE — Progress Notes (Signed)
[name], [name] ([id number]) Visit Report for 02/20/2018 Arrival Information Details Patient Name: [name], XU. Date of Service: 02/20/2018 10:00 AM Medical Record Number: [id number] Patient Account Number: [id number] Date of Birth/Sex: December 07, 1930 (84 y.o. M) Treating RN: Ahmed Prima Primary Care Takyla Kuchera: Burman Freestone Other Clinician: Referring Caliyah Sieh: Burman Freestone Treating Grayson White/Extender: [name], HOYT Weeks in Treatment: 2 Visit Information History Since Last Visit Added or deleted any medications: No Patient Arrived: Cane Any new allergies or adverse reactions: No Arrival Time: 10:49 Had a fall or experienced change in No Accompanied By: daughters activities of daily living that may affect Transfer Assistance: None risk of falls: Patient Identification Verified: Yes Signs or symptoms of abuse/neglect since last visito No Secondary Verification Process Completed: Yes Hospitalized since last visit: No Patient Requires Transmission-Based No Implantable device outside of the clinic excluding No Precautions: cellular tissue based products placed in the center Patient Has Alerts: Yes since last visit: Has Dressing in Place as Prescribed: Yes Pain Present Now: No Electronic Signature(s) Signed: 03/14/2018 7:45:54 AM By: Harold Barban Entered By: Harold Barban on 02/20/2018 10:50:19 [name] ([id number]) -------------------------------------------------------------------------------- Clinic Level of Care Assessment Details Patient Name: [name] Date of Service: 02/20/2018 10:00 AM Medical Record Number: [id number] Patient Account Number: [id number] Date of Birth/Sex: December 12, 1930 (84 y.o. M) Treating RN: Ahmed Prima Primary Care Lazarius Rivkin: Burman Freestone Other Clinician: Referring Hawley Pavia: Burman Freestone Treating Jatoria Kneeland/Extender: [name], HOYT Weeks in Treatment: 2 Clinic Level of Care Assessment Items TOOL 4 Quantity  Score X - Use when only an EandM is performed on FOLLOW-UP visit 1 0 ASSESSMENTS - Nursing Assessment / Reassessment X - Reassessment of Co-morbidities (includes updates in patient status) 1 10 X- 1 5 Reassessment of Adherence to Treatment Plan ASSESSMENTS - Wound and Skin Assessment / Reassessment X - Simple Wound Assessment / Reassessment - one wound 1 5 []  - 0 Complex Wound Assessment / Reassessment - multiple wounds []  - 0 Dermatologic / Skin Assessment (not related to wound area) ASSESSMENTS - Focused Assessment []  - Circumferential Edema Measurements - multi extremities 0 []  - 0 Nutritional Assessment / Counseling / Intervention []  - 0 Lower Extremity Assessment (monofilament, tuning fork, pulses) []  - 0 Peripheral Arterial Disease Assessment (using hand held doppler) ASSESSMENTS - Ostomy and/or Continence Assessment and Care []  - Incontinence Assessment and Management 0 []  - 0 Ostomy Care Assessment and Management (repouching, etc.) PROCESS - Coordination of Care []  - Simple Patient / Family Education for ongoing care 0 X- 1 20 Complex (extensive) Patient / Family Education for ongoing care X- 1 10 Staff obtains Programmer, systems, Records, Test Results / Process Orders X- 1 10 Staff telephones HHA, Nursing Homes / Clarify orders / etc []  - 0 Routine Transfer to another Facility (non-emergent condition) []  - 0 Routine Hospital Admission (non-emergent condition) []  - 0 New Admissions / Biomedical engineer / Ordering NPWT, Apligraf, etc. []  - 0 Emergency Hospital Admission (emergent condition) X- 1 10 Simple Discharge Coordination [name], [name] ([id number]) []  - 0 Complex (extensive) Discharge Coordination PROCESS - Special Needs []  - Pediatric / Minor Patient Management 0 []  - 0 Isolation Patient Management []  - 0 Hearing / Language / Visual special needs []  - 0 Assessment of Community assistance (transportation, D/C planning, etc.) []  - 0 Additional  assistance / Altered mentation []  - 0 Support Surface(s) Assessment (bed, cushion, seat, etc.) INTERVENTIONS - Wound Cleansing / Measurement X - Simple Wound Cleansing - one wound 1 5 []  - 0 Complex Wound  Cleansing - multiple wounds X- 1 5 Wound Imaging (photographs - any number of wounds) []  - 0 Wound Tracing (instead of photographs) X- 1 5 Simple Wound Measurement - one wound []  - 0 Complex Wound Measurement - multiple wounds INTERVENTIONS - Wound Dressings X - Small Wound Dressing one or multiple wounds 1 10 []  - 0 Medium Wound Dressing one or multiple wounds []  - 0 Large Wound Dressing one or multiple wounds X- 1 5 Application of Medications - topical []  - 0 Application of Medications - injection INTERVENTIONS - Miscellaneous []  - External ear exam 0 X- 1 5 Specimen Collection (cultures, biopsies, blood, body fluids, etc.) X- 1 5 Specimen(s) / Culture(s) sent or taken to Lab for analysis []  - 0 Patient Transfer (multiple staff / Civil Service fast streamer / Similar devices) []  - 0 Simple Staple / Suture removal (25 or less) []  - 0 Complex Staple / Suture removal (26 or more) []  - 0 Hypo / Hyperglycemic Management (close monitor of Blood Glucose) []  - 0 Ankle / Brachial Index (ABI) - do not check if billed separately X- 1 5 Vital Signs [name], [name] ([id number]) Has the patient been seen at the hospital within the last three years: Yes Total Score: 115 Level Of Care: New/Established - Level 3 Electronic Signature(s) Signed: 02/20/2018 5:11:14 PM By: Alric Quan Entered By: Alric Quan on 02/20/2018 13:05:52 [name] ([id number]) -------------------------------------------------------------------------------- Multi Wound Chart Details Patient Name: [name] Date of Service: 02/20/2018 10:00 AM Medical Record Number: [id number] Patient Account Number: [id number] Date of Birth/Sex: 1931-03-25 (84 y.o. M) Treating RN: Ahmed Prima Primary Care  Travaris Kosh: Burman Freestone Other Clinician: Referring Shevy Yaney: Burman Freestone Treating Ahria Slappey/Extender: [name], HOYT Weeks in Treatment: 2 Vital Signs Height(in): 69 Pulse(bpm): 32 Weight(lbs): 155 Blood Pressure(mmHg): 138/63 Body Mass Index(BMI): 23 Temperature(F): 98.3 Respiratory Rate 16 (breaths/min): Photos: [1:No Photos] [N/A:N/A] Wound Location: [1:Sacrum] [N/A:N/A] Wounding Event: [1:Pressure Injury] [N/A:N/A] Primary Etiology: [1:Pressure Ulcer] [N/A:N/A] Comorbid History: [1:Cataracts, Asthma, Angina, Arrhythmia, Coronary Artery Disease, Hypertension, Myocardial Infarction, Osteoarthritis, Dementia, Neuropathy] [N/A:N/A] Date Acquired: [1:01/17/2018] [N/A:N/A] Weeks of Treatment: [1:2] [N/A:N/A] Wound Status: [1:Open] [N/A:N/A] Measurements L x W x D [1:5.2x4.7x1.3] [N/A:N/A] (cm) Area (cm) : [1:19.195] [N/A:N/A] Volume (cm) : [1:24.954] [N/A:N/A] % Reduction in Area: [1:6.00%] [N/A:N/A] % Reduction in Volume: [1:38.90%] [N/A:N/A] Position 1 (o'clock): [1:2] Maximum Distance 1 (cm): [1:1.7] Position 2 (o'clock): [1:6] Maximum Distance 2 (cm): [1:1.5] Position 3 (o'clock): [1:11] Maximum Distance 3 (cm): [1:1.3] Starting Position 1 [1:12] (o'clock): Ending Position 1 [1:12] (o'clock): Maximum Distance 1 (cm): [1:2] Tunneling: [1:Yes] [N/A:N/A] Undermining: [1:Yes] [N/A:N/A] Classification: [1:Category/Stage III] [N/A:N/A] Exudate Amount: [1:Large] [N/A:N/A] Exudate Type: [1:Serosanguineous] [N/A:N/A] Exudate Color: [1:red, brown] [N/A:N/A] Foul Odor After Cleansing: [1:Yes] [N/A:N/A] Odor Anticipated Due to No N/A N/A Product Use: Wound Margin: Distinct, outline attached N/A N/A Granulation Amount: Large (67-100%) N/A N/A Granulation Quality: Red, Hyper-granulation N/A N/A Necrotic Amount: Small (1-33%) N/A N/A Exposed Structures: Fat Layer (Subcutaneous N/A N/A Tissue) Exposed: Yes Fascia: No Tendon: No Muscle: No Joint: No Bone:  No Epithelialization: None N/A N/A Periwound Skin Texture: No Abnormalities Noted N/A N/A Periwound Skin Moisture: Maceration: Yes N/A N/A Periwound Skin Color: Ecchymosis: Yes N/A N/A Erythema: Yes Erythema Location: Circumferential N/A N/A Temperature: No Abnormality N/A N/A Tenderness on Palpation: Yes N/A N/A Wound Preparation: Ulcer Cleansing: N/A N/A Rinsed/Irrigated with Saline Topical Anesthetic Applied: Other: lidocaine 4% Treatment Notes Electronic Signature(s) Signed: 02/20/2018 5:11:14 PM By: Alric Quan Entered By: Alric Quan on 02/20/2018 11:28:04 [name]  [name] ([id number]) -------------------------------------------------------------------------------- [place] Details Patient Name: [name], TILLMAN. Date of Service: 02/20/2018 10:00 AM Medical Record Number: [id number] Patient Account Number: [id number] Date of Birth/Sex: 01-04-1931 (84 y.o. M) Treating RN: Ahmed Prima Primary Care Japleen Tornow: Burman Freestone Other Clinician: Referring Logun Colavito: Burman Freestone Treating Tameah Mihalko/Extender: [name], HOYT Weeks in Treatment: 2 Active Inactive ` Abuse / Safety / Falls / Self Care Management Nursing Diagnoses: History of Falls Goals: Patient will not experience any injury related to falls Date Initiated: 02/06/2018 Target Resolution Date: 03/08/2018 Goal Status: Active Interventions: Assess fall risk on admission and as needed Treatment Activities: Patient referred to home care : 02/06/2018 Notes: ` Necrotic Tissue Nursing Diagnoses: Impaired tissue integrity related to necrotic/devitalized tissue Goals: Necrotic/devitalized tissue will be minimized in the wound bed Date Initiated: 02/06/2018 Target Resolution Date: 03/08/2018 Goal Status: Active Interventions: Assess patient pain level pre-, during and post procedure and prior to discharge Treatment Activities: Excisional debridement :  02/06/2018 Notes: ` Nutrition Nursing Diagnoses: Potential for alteratiion in Nutrition/Potential for imbalanced nutrition [name], [name] ([id number]) Goals: Patient/caregiver agrees to and verbalizes understanding of need to obtain nutritional consultation Date Initiated: 02/06/2018 Target Resolution Date: 03/08/2018 Goal Status: Active Interventions: Provide education on nutrition Notes: ` Orientation to the Wound Care Program Nursing Diagnoses: Knowledge deficit related to the wound healing center program Goals: Patient/caregiver will verbalize understanding of the [facility] Program Date Initiated: 02/06/2018 Target Resolution Date: 03/08/2018 Goal Status: Active Interventions: Provide education on orientation to the wound center Notes: ` Pressure Nursing Diagnoses: Knowledge deficit related to management of pressures ulcers Goals: Patient/caregiver will verbalize understanding of pressure ulcer management Date Initiated: 02/06/2018 Target Resolution Date: 03/08/2018 Goal Status: Active Interventions: Assess offloading mechanisms upon admission and as needed Provide education on pressure ulcers Notes: ` Wound/Skin Impairment Nursing Diagnoses: Knowledge deficit related to ulceration/compromised skin integrity Goals: Ulcer/skin breakdown will have a volume reduction of 80% by week 12 Date Initiated: 02/06/2018 Target Resolution Date: 04/08/2018 Goal Status: Active [name], [name] ([id number]) Interventions: Assess ulceration(s) every visit Treatment Activities: Skin care regimen initiated : 02/06/2018 Topical wound management initiated : 02/06/2018 Notes: Electronic Signature(s) Signed: 02/20/2018 5:11:14 PM By: Alric Quan Entered By: Alric Quan on 02/20/2018 11:27:46 [name] ([id number]) -------------------------------------------------------------------------------- Pain Assessment Details Patient Name: [name] Date of  Service: 02/20/2018 10:00 AM Medical Record Number: [id number] Patient Account Number: [id number] Date of Birth/Sex: 16-Sep-1931 (84 y.o. M) Treating RN: Ahmed Prima Primary Care [name] Klumpp: Burman Freestone Other Clinician: Referring Mali Eppard: Burman Freestone Treating Deirdra Heumann/Extender: [name], HOYT Weeks in Treatment: 2 Active Problems Location of Pain Severity and Description of Pain Patient Has Paino No Site Locations Pain Management and Medication Current Pain Management: Electronic Signature(s) Signed: 02/20/2018 5:11:14 PM By: Alric Quan Signed: 03/14/2018 7:45:54 AM By: Harold Barban Entered By: Harold Barban on 02/20/2018 10:50:48 [name] ([id number]) -------------------------------------------------------------------------------- Wound Assessment Details Patient Name: [name], [name]. Date of Service: 02/20/2018 10:00 AM Medical Record Number: [id number] Patient Account Number: [id number] Date of Birth/Sex: June 01, 1931 (84 y.o. M) Treating RN: Ahmed Prima Primary Care Henrine Hayter: Burman Freestone Other Clinician: Referring Liddie Chichester: Burman Freestone Treating Milderd Manocchio/Extender: [name], HOYT Weeks in Treatment: 2 Wound Status Wound Number: 1 Primary Pressure Ulcer Etiology: Wound Location: Sacrum Wound Open Wounding Event: Pressure Injury Status: Date Acquired: 01/17/2018 Comorbid Cataracts, Asthma, Angina, Arrhythmia, Weeks Of Treatment: 2 History: Coronary Artery Disease, Hypertension, Clustered Wound: No Myocardial Infarction, Osteoarthritis, Dementia, Neuropathy Photos Photo Uploaded By: [name] on 02/20/2018 16:53:46 Wound Measurements  Length: (cm) 5.2 Width: (cm) 4.7 Depth: (cm) 1.3 Area: (cm) 19.195 Volume: (cm) 24.954 % Reduction in Area: 6% % Reduction in Volume: 38.9% Epithelialization: None Tunneling: Yes Location 1 Position (o'clock): 2 Maximum Distance: (cm) 1.7 Location 2 Position (o'clock): 6 Maximum  Distance: (cm) 1.5 Location 3 Position (o'clock): 11 Maximum Distance: (cm) 1.3 Undermining: Yes Starting Position (o'clock): 12 Ending Position (o'clock): 12 Maximum Distance: (cm) 2 Wound Description Classification: Category/Stage III [name], [name] ([id number]) Foul Odor After Cleansing: Yes Wound Margin: Distinct, outline attached Due to Product Use: No Exudate Amount: Large Slough/Fibrino Yes Exudate Type: Serosanguineous Exudate Color: red, brown Wound Bed Granulation Amount: Large (67-100%) Exposed Structure Granulation Quality: Red, Hyper-granulation Fascia Exposed: No Necrotic Amount: Small (1-33%) Fat Layer (Subcutaneous Tissue) Exposed: Yes Necrotic Quality: Adherent Slough Tendon Exposed: No Muscle Exposed: No Joint Exposed: No Bone Exposed: No Periwound Skin Texture Texture Color No Abnormalities Noted: No No Abnormalities Noted: No Ecchymosis: Yes Moisture Erythema: Yes No Abnormalities Noted: No Erythema Location: Circumferential Maceration: Yes Temperature / Pain Temperature: No Abnormality Tenderness on Palpation: Yes Wound Preparation Ulcer Cleansing: Rinsed/Irrigated with Saline Topical Anesthetic Applied: Other: lidocaine 4%, Electronic Signature(s) Signed: 02/20/2018 5:11:14 PM By: Alric Quan Signed: 03/14/2018 7:45:54 AM By: Harold Barban Entered By: Harold Barban on 02/20/2018 11:03:40 [name] ([id number]) -------------------------------------------------------------------------------- Vitals Details Patient Name: [name] Date of Service: 02/20/2018 10:00 AM Medical Record Number: [id number] Patient Account Number: [id number] Date of Birth/Sex: 1930-11-07 (84 y.o. M) Treating RN: Ahmed Prima Primary Care Srinidhi Landers: Burman Freestone Other Clinician: Referring Cieara Stierwalt: Burman Freestone Treating Neisha Hinger/Extender: [name], HOYT Weeks in Treatment: 2 Vital Signs Time Taken: 10:52 Temperature (F):  98.3 Height (in): 69 Pulse (bpm): 57 Weight (lbs): 155 Respiratory Rate (breaths/min): 16 Body Mass Index (BMI): 22.9 Blood Pressure (mmHg): 138/63 Reference Range: 80 - 120 mg / dl Electronic Signature(s) Signed: 03/14/2018 7:45:54 AM By: Harold Barban Entered By: Harold Barban on 02/20/2018 10:53:11

## 2018-03-16 ENCOUNTER — Telehealth: Payer: Self-pay | Admitting: Internal Medicine

## 2018-03-16 NOTE — Telephone Encounter (Signed)
Spoke with patient's daughter (DPR). He has been living at her house since his fall/hospitalization/rehab discharge. Pt has increase in swelling to feet/ankles No SOB, no other symptoms. On 5/28 called with chest pain and was advised to go to ER for eval but did not go.  Used NTG, pain was relieved by this.  Discussed diet. She prepares meals and does not use salt on meals but "he probably drinks more Pepsi than he should" Does not wear compression stockings, wearing diabetic socks with small amount compression.  Does not weigh daily. Advised to minimize sodium in diet and begin to weigh every morning.  Advised that I will send message to Dr. Harrington Challenger for further recommendations and will call her back.  She is in agreement with this plan.

## 2018-03-16 NOTE — Telephone Encounter (Signed)
Agree with plans to watch salt (2-3 G), Fluids 1500 cc perday Agree with daily wts

## 2018-03-16 NOTE — Telephone Encounter (Signed)
Pt's daughter calling  Pt c/o swelling: STAT is pt has developed SOB within 24 hours  1) How much weight have you gained and in what time span?  5 lbs in a week  2) If swelling, where is the swelling located? Left hand and both hands and feet  3) Are you currently taking a fluid pill? No  4) Are you currently SOB? No   5) Do you have a log of your daily weights (if so, list)? No  6) Have you gained 3 pounds in a day or 5 pounds in a week? Yes  7) Have you traveled recently? No

## 2018-03-18 NOTE — Progress Notes (Signed)
[name], [name] ([id number]) Visit Report for 03/13/2018 Arrival Information Details Patient Name: [name], [name]. Date of Service: 03/13/2018 3:15 PM Medical Record Number: [id number] Patient Account Number: [id number] Date of Birth/Sex: May 15, 1931 (84 y.o. M) Treating RN: Montey Hora Primary Care Griselda Tosh: Burman Freestone Other Clinician: Referring Isam Unrein: Burman Freestone Treating Skyah Hannon/Extender: [name], HOYT Weeks in Treatment: 5 Visit Information History Since Last Visit Added or deleted any medications: No Patient Arrived: Cane Any new allergies or adverse reactions: No Arrival Time: 15:42 Had a fall or experienced change in No Accompanied By: dtr anf activities of daily living that may affect spouse risk of falls: Transfer Assistance: None Signs or symptoms of abuse/neglect since last visito No Patient Identification Verified: Yes Hospitalized since last visit: No Secondary Verification Process Completed: Yes Implantable device outside of the clinic excluding No Patient Requires Transmission-Based No cellular tissue based products placed in the center Precautions: since last visit: Patient Has Alerts: Yes Has Dressing in Place as Prescribed: Yes Pain Present Now: No Electronic Signature(s) Signed: 03/13/2018 5:40:20 PM By: Montey Hora Entered By: Montey Hora on 03/13/2018 15:43:08 [name] ([id number]) -------------------------------------------------------------------------------- Clinic Level of Care Assessment Details Patient Name: [name] Date of Service: 03/13/2018 3:15 PM Medical Record Number: [id number] Patient Account Number: [id number] Date of Birth/Sex: Jul 25, 1931 (84 y.o. M) Treating RN: Ahmed Prima Primary Care Berenise Hunton: Burman Freestone Other Clinician: Referring Kisean Rollo: Burman Freestone Treating Hana Trippett/Extender: [name], HOYT Weeks in Treatment: 5 Clinic Level of Care Assessment Items TOOL 4 Quantity  Score X - Use when only an EandM is performed on FOLLOW-UP visit 1 0 ASSESSMENTS - Nursing Assessment / Reassessment X - Reassessment of Co-morbidities (includes updates in patient status) 1 10 X- 1 5 Reassessment of Adherence to Treatment Plan ASSESSMENTS - Wound and Skin Assessment / Reassessment X - Simple Wound Assessment / Reassessment - one wound 1 5 []  - 0 Complex Wound Assessment / Reassessment - multiple wounds []  - 0 Dermatologic / Skin Assessment (not related to wound area) ASSESSMENTS - Focused Assessment []  - Circumferential Edema Measurements - multi extremities 0 []  - 0 Nutritional Assessment / Counseling / Intervention []  - 0 Lower Extremity Assessment (monofilament, tuning fork, pulses) []  - 0 Peripheral Arterial Disease Assessment (using hand held doppler) ASSESSMENTS - Ostomy and/or Continence Assessment and Care []  - Incontinence Assessment and Management 0 []  - 0 Ostomy Care Assessment and Management (repouching, etc.) PROCESS - Coordination of Care []  - Simple Patient / Family Education for ongoing care 0 X- 1 20 Complex (extensive) Patient / Family Education for ongoing care X- 1 10 Staff obtains Programmer, systems, Records, Test Results / Process Orders X- 1 10 Staff telephones HHA, Nursing Homes / Clarify orders / etc []  - 0 Routine Transfer to another Facility (non-emergent condition) []  - 0 Routine Hospital Admission (non-emergent condition) []  - 0 New Admissions / Biomedical engineer / Ordering NPWT, Apligraf, etc. []  - 0 Emergency Hospital Admission (emergent condition) X- 1 10 Simple Discharge Coordination ANUAR, WALGREN ([id number]) []  - 0 Complex (extensive) Discharge Coordination PROCESS - Special Needs []  - Pediatric / Minor Patient Management 0 []  - 0 Isolation Patient Management []  - 0 Hearing / Language / Visual special needs []  - 0 Assessment of Community assistance (transportation, D/C planning, etc.) []  - 0 Additional  assistance / Altered mentation []  - 0 Support Surface(s) Assessment (bed, cushion, seat, etc.) INTERVENTIONS - Wound Cleansing / Measurement X - Simple Wound Cleansing - one wound 1 5 []  - 0  Complex Wound Cleansing - multiple wounds X- 1 5 Wound Imaging (photographs - any number of wounds) []  - 0 Wound Tracing (instead of photographs) X- 1 5 Simple Wound Measurement - one wound []  - 0 Complex Wound Measurement - multiple wounds INTERVENTIONS - Wound Dressings X - Small Wound Dressing one or multiple wounds 1 10 []  - 0 Medium Wound Dressing one or multiple wounds []  - 0 Large Wound Dressing one or multiple wounds X- 1 5 Application of Medications - topical []  - 0 Application of Medications - injection INTERVENTIONS - Miscellaneous []  - External ear exam 0 []  - 0 Specimen Collection (cultures, biopsies, blood, body fluids, etc.) []  - 0 Specimen(s) / Culture(s) sent or taken to Lab for analysis []  - 0 Patient Transfer (multiple staff / Civil Service fast streamer / Similar devices) []  - 0 Simple Staple / Suture removal (25 or less) []  - 0 Complex Staple / Suture removal (26 or more) []  - 0 Hypo / Hyperglycemic Management (close monitor of Blood Glucose) []  - 0 Ankle / Brachial Index (ABI) - do not check if billed separately X- 1 5 Vital Signs [name], [name] ([id number]) Has the patient been seen at the hospital within the last three years: Yes Total Score: 105 Level Of Care: New/Established - Level 3 Electronic Signature(s) Signed: 03/16/2018 5:38:20 PM By: Alric Quan Entered By: Alric Quan on 03/13/2018 17:46:53 [name] ([id number]) -------------------------------------------------------------------------------- Encounter Discharge Information Details Patient Name: [name] Date of Service: 03/13/2018 3:15 PM Medical Record Number: [id number] Patient Account Number: [id number] Date of Birth/Sex: 06-09-1931 (84 y.o. M) Treating RN: Roger Shelter Primary Care Tinamarie Przybylski: Burman Freestone Other Clinician: Referring Pellegrino Kennard: Burman Freestone Treating Carmichael Burdette/Extender: [name], HOYT Weeks in Treatment: 5 Encounter Discharge Information Items Discharge Condition: Stable Ambulatory Status: Wheelchair Discharge Destination: Home Transportation: Private Auto Accompanied By: daughers Schedule Follow-up Appointment: Yes Clinical Summary of Care: Electronic Signature(s) Signed: 03/13/2018 5:16:34 PM By: Roger Shelter Entered By: Roger Shelter on 03/13/2018 16:43:39 Racanelli, [name] ([id number]) -------------------------------------------------------------------------------- Lower Extremity Assessment Details Patient Name: [name] Date of Service: 03/13/2018 3:15 PM Medical Record Number: [id number] Patient Account Number: [id number] Date of Birth/Sex: 03/30/1931 (84 y.o. M) Treating RN: Montey Hora Primary Care Banyan Goodchild: Burman Freestone Other Clinician: Referring Dez Stauffer: Burman Freestone Treating Kallyn Demarcus/Extender: [name], HOYT Weeks in Treatment: 5 Electronic Signature(s) Signed: 03/13/2018 5:40:20 PM By: Montey Hora Entered By: Montey Hora on 03/13/2018 15:54:42 [name] ([id number]) -------------------------------------------------------------------------------- Multi Wound Chart Details Patient Name: [name] Date of Service: 03/13/2018 3:15 PM Medical Record Number: [id number] Patient Account Number: [id number] Date of Birth/Sex: [birth year] (84 y.o. M) Treating RN: Ahmed Prima Primary Care Briyonna Omara: Burman Freestone Other Clinician: Referring Rilynn Habel: Burman Freestone Treating Shaquill Iseman/Extender: [name], HOYT Weeks in Treatment: 5 Vital Signs Height(in): 69 Pulse(bpm): 17 Weight(lbs): 155 Blood Pressure(mmHg): 159/62 Body Mass Index(BMI): 23 Temperature(F): 98.6 Respiratory Rate 16 (breaths/min): Photos: [1:No Photos] [N/A:N/A] Wound Location:  [1:Sacrum] [N/A:N/A] Wounding Event: [1:Pressure Injury] [N/A:N/A] Primary Etiology: [1:Pressure Ulcer] [N/A:N/A] Comorbid History: [1:Cataracts, Asthma, Angina, Arrhythmia, Coronary Artery Disease, Hypertension, Myocardial Infarction, Osteoarthritis, Dementia, Neuropathy] [N/A:N/A] Date Acquired: [1:01/17/2018] [N/A:N/A] Weeks of Treatment: [1:5] [N/A:N/A] Wound Status: [1:Open] [N/A:N/A] Measurements L x W x D [1:4.2x3.2x1.8] [N/A:N/A] (cm) Area (cm) : [1:10.556] [N/A:N/A] Volume (cm) : [1:19] [N/A:N/A] % Reduction in Area: [1:48.30%] [N/A:N/A] % Reduction in Volume: [1:53.50%] [N/A:N/A] Classification: [1:Category/Stage III] [N/A:N/A] Exudate Amount: [1:Large] [N/A:N/A] Exudate Type: [1:Purulent] [N/A:N/A] Exudate Color: [1:yellow, brown, green] [N/A:N/A] Wound Margin: [1:Distinct, outline attached] [N/A:N/A] Granulation  Amount: [1:Large (67-100%)] [N/A:N/A] Granulation Quality: [1:Red, Hyper-granulation] [N/A:N/A] Necrotic Amount: [1:Small (1-33%)] [N/A:N/A] Exposed Structures: [1:Fat Layer (Subcutaneous Tissue) Exposed: Yes Fascia: No Tendon: No Muscle: No Joint: No Bone: No] [N/A:N/A] Epithelialization: [1:None] [N/A:N/A] Periwound Skin Texture: [1:Excoriation: Yes Induration: No] [N/A:N/A] Callus: No Crepitus: No Rash: No Scarring: No Periwound Skin Moisture: Maceration: No N/A N/A Dry/Scaly: No Periwound Skin Color: Erythema: Yes N/A N/A Atrophie Blanche: No Cyanosis: No Ecchymosis: No Hemosiderin Staining: No Mottled: No Pallor: No Rubor: No Erythema Location: Circumferential N/A N/A Temperature: No Abnormality N/A N/A Tenderness on Palpation: Yes N/A N/A Wound Preparation: Ulcer Cleansing: N/A N/A Rinsed/Irrigated with Saline Topical Anesthetic Applied: Other: lidocaine 4% Treatment Notes Electronic Signature(s) Signed: 03/16/2018 5:38:20 PM By: Alric Quan Entered By: Alric Quan on 03/13/2018 16:21:54 [name]  ([id number]) -------------------------------------------------------------------------------- [place] Details Patient Name: [name], [name]. Date of Service: 03/13/2018 3:15 PM Medical Record Number: [id number] Patient Account Number: [id number] Date of Birth/Sex: 02-Apr-1931 (84 y.o. M) Treating RN: Ahmed Prima Primary Care Arian Mcquitty: Burman Freestone Other Clinician: Referring Johnette Teigen: Burman Freestone Treating Skylynne Schlechter/Extender: [name], HOYT Weeks in Treatment: 5 Active Inactive ` Abuse / Safety / Falls / Self Care Management Nursing Diagnoses: History of Falls Goals: Patient will not experience any injury related to falls Date Initiated: 02/06/2018 Target Resolution Date: 03/08/2018 Goal Status: Active Interventions: Assess fall risk on admission and as needed Treatment Activities: Patient referred to home care : 02/06/2018 Notes: ` Necrotic Tissue Nursing Diagnoses: Impaired tissue integrity related to necrotic/devitalized tissue Goals: Necrotic/devitalized tissue will be minimized in the wound bed Date Initiated: 02/06/2018 Target Resolution Date: 03/08/2018 Goal Status: Active Interventions: Assess patient pain level pre-, during and post procedure and prior to discharge Treatment Activities: Excisional debridement : 02/06/2018 Notes: ` Nutrition Nursing Diagnoses: Potential for alteratiion in Nutrition/Potential for imbalanced nutrition [name], [name] ([id number]) Goals: Patient/caregiver agrees to and verbalizes understanding of need to obtain nutritional consultation Date Initiated: 02/06/2018 Target Resolution Date: 03/08/2018 Goal Status: Active Interventions: Provide education on nutrition Notes: ` Orientation to the Wound Care Program Nursing Diagnoses: Knowledge deficit related to the wound healing center program Goals: Patient/caregiver will verbalize understanding of the [place] Program Date Initiated:  02/06/2018 Target Resolution Date: 03/08/2018 Goal Status: Active Interventions: Provide education on orientation to the wound center Notes: ` Pressure Nursing Diagnoses: Knowledge deficit related to management of pressures ulcers Goals: Patient/caregiver will verbalize understanding of pressure ulcer management Date Initiated: 02/06/2018 Target Resolution Date: 03/08/2018 Goal Status: Active Interventions: Assess offloading mechanisms upon admission and as needed Provide education on pressure ulcers Notes: ` Wound/Skin Impairment Nursing Diagnoses: Knowledge deficit related to ulceration/compromised skin integrity Goals: Ulcer/skin breakdown will have a volume reduction of 80% by week 12 Date Initiated: 02/06/2018 Target Resolution Date: 04/08/2018 Goal Status: Active [name], [name] ([id number]) Interventions: Assess ulceration(s) every visit Treatment Activities: Skin care regimen initiated : 02/06/2018 Topical wound management initiated : 02/06/2018 Notes: Electronic Signature(s) Signed: 03/16/2018 5:38:20 PM By: Alric Quan Entered By: Alric Quan on 03/13/2018 16:21:41 [name] ([id number]) -------------------------------------------------------------------------------- Pain Assessment Details Patient Name: [name] Date of Service: 03/13/2018 3:15 PM Medical Record Number: [id number] Patient Account Number: [id number] Date of Birth/Sex: [birth year] (84 y.o. M) Treating RN: Montey Hora Primary Care Guerry Covington: Burman Freestone Other Clinician: Referring Emanuell Morina: Burman Freestone Treating Diahn Waidelich/Extender: [name], HOYT Weeks in Treatment: 5 Active Problems Location of Pain Severity and Description of Pain Patient Has Paino Yes Site Locations Pain Location: Pain in Ulcers With Dressing Change:  Yes Duration of the Pain. Constant / Intermittento Intermittent Pain Management and Medication Current Pain Management: Electronic  Signature(s) Signed: 03/13/2018 5:40:20 PM By: Montey Hora Entered By: Montey Hora on 03/13/2018 15:45:09 [name] ([id number]) -------------------------------------------------------------------------------- Patient/Caregiver Education Details Patient Name: [name] Date of Service: 03/13/2018 3:15 PM Medical Record Number: [id number] Patient Account Number: [id number] Date of Birth/Gender: Nov 12, 1930 (84 y.o. M) Treating RN: Roger Shelter Primary Care Physician: Burman Freestone Other Clinician: Referring Physician: Burman Freestone Treating Physician/Extender: Sharalyn Ink in Treatment: 5 Education Assessment Education Provided To: Patient Education Topics Provided Wound Debridement: Handouts: Wound Debridement Methods: Explain/Verbal Responses: State content correctly Wound/Skin Impairment: Handouts: Caring for Your Ulcer Methods: Explain/Verbal Responses: State content correctly Electronic Signature(s) Signed: 03/13/2018 5:16:34 PM By: Roger Shelter Entered By: Roger Shelter on 03/13/2018 16:44:29 [name] ([id number]) -------------------------------------------------------------------------------- Wound Assessment Details Patient Name: [name] Date of Service: 03/13/2018 3:15 PM Medical Record Number: [id number] Patient Account Number: [id number] Date of Birth/Sex: 31-Jan-1931 (84 y.o. M) Treating RN: Montey Hora Primary Care Ocean Kearley: Burman Freestone Other Clinician: Referring Ethleen Lormand: Burman Freestone Treating Destin Vinsant/Extender: [name], HOYT Weeks in Treatment: 5 Wound Status Wound Number: 1 Primary Pressure Ulcer Etiology: Wound Location: Sacrum Wound Open Wounding Event: Pressure Injury Status: Date Acquired: 01/17/2018 Comorbid Cataracts, Asthma, Angina, Arrhythmia, Weeks Of Treatment: 5 History: Coronary Artery Disease, Hypertension, Clustered Wound: No Myocardial Infarction, Osteoarthritis,  Dementia, Neuropathy Photos Photo Uploaded By: Montey Hora on 03/13/2018 16:29:06 Wound Measurements Length: (cm) 4.2 Width: (cm) 3.2 Depth: (cm) 1.8 Area: (cm) 10.556 Volume: (cm) 19 % Reduction in Area: 48.3% % Reduction in Volume: 53.5% Epithelialization: None Tunneling: No Undermining: No Wound Description Classification: Category/Stage III Wound Margin: Distinct, outline attached Exudate Amount: Large Exudate Type: Purulent Exudate Color: yellow, brown, green Foul Odor After Cleansing: No Slough/Fibrino Yes Wound Bed Granulation Amount: Large (67-100%) Exposed Structure Granulation Quality: Red, Hyper-granulation Fascia Exposed: No Necrotic Amount: Small (1-33%) Fat Layer (Subcutaneous Tissue) Exposed: Yes Necrotic Quality: Adherent Slough Tendon Exposed: No Muscle Exposed: No Joint Exposed: No Bone Exposed: No [name], [name] ([id number]) Periwound Skin Texture Texture Color No Abnormalities Noted: No No Abnormalities Noted: No Callus: No Atrophie Blanche: No Crepitus: No Cyanosis: No Excoriation: Yes Ecchymosis: No Induration: No Erythema: Yes Rash: No Erythema Location: Circumferential Scarring: No Hemosiderin Staining: No Mottled: No Moisture Pallor: No No Abnormalities Noted: No Rubor: No Dry / Scaly: No Maceration: No Temperature / Pain Temperature: No Abnormality Tenderness on Palpation: Yes Wound Preparation Ulcer Cleansing: Rinsed/Irrigated with Saline Topical Anesthetic Applied: Other: lidocaine 4%, Treatment Notes Wound #1 (Sacrum) 1. Cleansed with: Clean wound with Normal Saline 2. Anesthetic Topical Lidocaine 4% cream to wound bed prior to debridement 4. Dressing Applied: Other dressing (specify in notes) 5. Secondary Dressing Applied Dry Gauze Notes Dakins soaked gauze lightly packed into wound, covered with Drawtex and ABD secured with paper tape. Electronic Signature(s) Signed: 03/13/2018 5:40:20 PM By: Montey Hora Entered By: Montey Hora on 03/13/2018 15:54:26 [name] ([id number]) -------------------------------------------------------------------------------- Vitals Details Patient Name: [name] Date of Service: 03/13/2018 3:15 PM Medical Record Number: [id number] Patient Account Number: [id number] Date of Birth/Sex: April 02, 1931 (84 y.o. M) Treating RN: Montey Hora Primary Care Brittanee Ghazarian: Burman Freestone Other Clinician: Referring Ryana Montecalvo: Burman Freestone Treating Shian Goodnow/Extender: [name], HOYT Weeks in Treatment: 5 Vital Signs Time Taken: 15:46 Temperature (F): 98.6 Height (in): 69 Pulse (bpm): 68 Weight (lbs): 155 Respiratory Rate (breaths/min): 16 Body Mass Index (BMI): 22.9 Blood Pressure (mmHg): 159/62 Reference Range:  80 - 120 mg / dl Electronic Signature(s) Signed: 03/13/2018 5:40:20 PM By: Montey Hora Entered By: Montey Hora on 03/13/2018 15:46:51

## 2018-03-18 NOTE — Progress Notes (Signed)
MATHESON, VANDEHEI ([id number]) Visit Report for 03/13/2018 Chief Complaint Document Details Patient Name: [name], [name]. Date of Service: 03/13/2018 3:15 PM Medical Record Number: [id number] Patient Account Number: [id number] Date of Birth/Sex: Jun 24, 1931 (84 y.o. M) Treating RN: Ahmed Prima Primary Care Provider: Burman Freestone Other Clinician: Referring Provider: Burman Freestone Treating Provider/Extender: [name], Loie Jahr Weeks in Treatment: 5 Information Obtained from: Patient Chief Complaint Sacral pressure ulcer and left shoulder rash Electronic Signature(s) Signed: 03/14/2018 8:29:14 AM By: Worthy Keeler PA-C Entered By: Worthy Keeler on 03/13/2018 15:41:18 [name] ([id number]) -------------------------------------------------------------------------------- HPI Details Patient Name: [name] Date of Service: 03/13/2018 3:15 PM Medical Record Number: [id number] Patient Account Number: [id number] Date of Birth/Sex: 07/24/1931 (84 y.o. M) Treating RN: Ahmed Prima Primary Care Provider: Burman Freestone Other Clinician: Referring Provider: Burman Freestone Treating Provider/Extender: [name], Yashica Sterbenz Weeks in Treatment: 5 History of Present Illness HPI Description: 02/06/18 on evaluation today patient presents for initial evaluation and our clinic concerning an issue which began roughly 3 weeks ago when the patient fell in his home on the floor in his kitchen and laid him down this detergent for roughly 3 days. He had a pressure injury to the left shoulder. This unfortunately has caused him a lot of discomfort although it finally seems to be doing better if anything is really having a lot of itching right now. This appears potentially be a contact dermatitis issue. He also has a significant pressure injury to the sacrum at this time as well which is also showing fascia exposure right over the bone but no evidence of bone exposure at this point which is  good news. They have been using Santyl as well as Saline soaked gauze at this point in time. There does appear to be a lot of necrotic slough in the base of the wound. He does have a history of incontinence, myocardial infarction, and hypertension. He also is "borderline diabetic" hemoglobin A1c of 6.0. Currently he has some discomfort in the pressure site at the sacrum but fortunately nothing too significant this did require sharp debridement today. 02/13/18 on evaluation today patient appears to be doing much better in regard to his sacral wound. He has been tolerating the dressing changes without complication with the Vashe. Fortunately there is no evidence of infection and though there is some Slough on the surface of the wound bed he has excellent granulation noted. Overall I'm pleased with how things have progressed in that regard. A glance at his shoulder as well and the rash seems to be someone improving in my pinion at this site as well. Overall I am pleased with what we're seeing and so is the family. 02/20/18 on evaluation today patient appears to be doing a little worse in regard to the sacral wound only in the fact that there is redness surrounding it has me somewhat concerned for infection. The drainage has also apparently been a little bit off color compared to normal according to family they did keep the dressing today that was removed to show me and I agree this seems to be a little bit different compared to what we have been seeing. Coupled with the redness I'm concerned he may be developing some cellulitis surrounding the wound bed. 02/27/18 on evaluation today patient presents for follow-up concerning his sacral ulcer. We have received the results back from his wound culture which shows unfortunately that the doxycycline will not be of benefit for him I am going to need to initiate  treatment with something else in order to treat the pseudomonas. Otherwise he does not seem to be  having any significant pain although his daughter states there are sometimes when he states having pain. We continue to use the Vashe currently. 03/06/18 on evaluation today patient's sacral wound appears to be doing better in my opinion. He has been tolerating the dressing changes without complication. With that being said the silver nitrate has helped with the prominent area of hyper granulation at the 6 o'clock location we will likely need to repeat this again today. Nonetheless overall I am pleased with how things have improved over the last week. The erythema surrounding the wound seems to be greatly improved. 03/13/18 on evaluation today patient's wound actually does not appear to be terribly infected although she does continue to have erythema surrounding the wound bed especially on the left border. I'm still somewhat concerned about the fact that the oral antibiotics alone may not be completely treating his infection. I previously discussed may need to go for IV antibiotic therapy I'm concerned that may be the case. We will need to make a referral today for infectious disease. Electronic Signature(s) Signed: 03/14/2018 8:29:14 AM By: Worthy Keeler PA-C Entered By: Worthy Keeler on 03/13/2018 17:24:37 [name] ([id number]) -------------------------------------------------------------------------------- Physical Exam Details Patient Name: [name], [name] Date of Service: 03/13/2018 3:15 PM Medical Record Number: [id number] Patient Account Number: [id number] Date of Birth/Sex: 01-Jun-1931 (84 y.o. M) Treating RN: Ahmed Prima Primary Care Provider: Burman Freestone Other Clinician: Referring Provider: Burman Freestone Treating Provider/Extender: STONE III, Demosthenes Virnig Weeks in Treatment: 5 Constitutional Well-nourished and well-hydrated in no acute distress. Respiratory normal breathing without difficulty. clear to auscultation bilaterally. Cardiovascular regular rate and rhythm  with normal S1, S2. Psychiatric this patient is able to make decisions and demonstrates good insight into disease process. Alert and Oriented x 3. pleasant and cooperative. Notes At this time patient's wound did not require any sharp debridement I really did not feel I needed to perform silver nitrate at this point on the hyper granular areas this appear to be doing fairly well. Overall I'm pleased in that regard although the erythema surrounding the wound does not have me at ease and I do think that he likely needs a referral to infectious disease in order to get this under control. I believe he may in the end require IV antibiotic therapy although obviously I'll leave this to the discretion of infectious disease. Electronic Signature(s) Signed: 03/14/2018 8:29:14 AM By: Worthy Keeler PA-C Entered By: Worthy Keeler on 03/13/2018 17:25:21 [name] ([id number]) -------------------------------------------------------------------------------- Physician Orders Details Patient Name: DORSIE, BURICH Date of Service: 03/13/2018 3:15 PM Medical Record Number: [id number] Patient Account Number: [id number] Date of Birth/Sex: May 28, 1931 (84 y.o. M) Treating RN: Ahmed Prima Primary Care Provider: Burman Freestone Other Clinician: Referring Provider: Burman Freestone Treating Provider/Extender: [name], [name] Graffam Weeks in Treatment: 5 Verbal / Phone Orders: Yes Clinician: Pinkerton, Debi Read Back and Verified: Yes Diagnosis Coding ICD-10 Coding Code Description L89.154 Pressure ulcer of sacral region, stage 4 L24.0 Irritant contact dermatitis due to detergents Wound Cleansing Wound #1 Sacrum o Clean wound with Normal Saline. Anesthetic (add to Medication List) Wound #1 Sacrum o Topical Lidocaine 4% cream applied to wound bed prior to debridement (In Clinic Only). Skin Barriers/Peri-Wound Care Wound #1 Sacrum o Barrier cream Primary Wound Dressing Wound #1 Sacrum o  Other: - 3 inch conform soaked in Vashe or Dakins and packed into wound Secondary Dressing  Wound #1 Sacrum o ABD pad o Dry Gauze o Other - Drawtex or equal Dressing Change Frequency Wound #1 Sacrum o Change dressing every day. o Change Dressing Monday, Wednesday, Friday - NPWT when applied Follow-up Appointments Wound #1 Sacrum o Return Appointment in 1 week. Off-Loading Wound #1 Sacrum o Mattress - air mattress wound care will order o Turn and reposition every 2 hours o Other: - hospital bed wound care will order SEIJI, WISWELL ([id number]) Additional Orders / Instructions Wound #1 Sacrum o Increase protein intake. Home Health Wound #1 [place] Visits - [facility]- Please order NPWT. If wound center needs to order, please call Kim @ [place] may visit PRN to address patientos wound care needs. o FACE TO FACE ENCOUNTER: MEDICARE and MEDICAID PATIENTS: I certify that this patient is under my care and that I had a face-to-face encounter that meets the physician face-to-face encounter requirements with this patient on this date. The encounter with the patient was in whole or in part for the following MEDICAL CONDITION: (primary reason for [place]) MEDICAL NECESSITY: I certify, that based on my findings, NURSING services are a medically necessary home health service. HOME BOUND STATUS: I certify that my clinical findings support that this patient is homebound (i.e., Due to illness or injury, pt requires aid of supportive devices such as crutches, cane, wheelchairs, walkers, the use of special transportation or the assistance of another person to leave their place of residence. There is a normal inability to leave the home and doing so requires considerable and taxing effort. Other absences are for medical reasons / religious services and are infrequent or of short duration when for other reasons). o If  current dressing causes regression in wound condition, may D/C ordered dressing product/s and apply Normal Saline Moist Dressing daily until next [place] / Other MD appointment. [place] of regression in wound condition at [phone]. o Please direct any NON-WOUND related issues/requests for orders to patient's Primary Care Physician Negative Pressure Wound Therapy Wound #1 Sacrum o Wound VAC settings at 125/130 mmHg continuous pressure. Use BLACK/GREEN foam to wound cavity. Use WHITE foam to fill any tunnel/s and/or undermining. Change VAC dressing 3 X WEEK. Change canister as indicated when full. Nurse may titrate settings and frequency of dressing changes as clinically indicated. o Home Health Nurse may d/c VAC for s/s of increased infection, significant wound regression, or uncontrolled drainage. [place] at [phone]. Medications-please add to medication list. Wound #1 Sacrum o Other: - TAC on back and shoulder Consults o Infectious Disease - [place] Patient Medications Allergies: Lyrica, gabapentin, Sulfa (Sulfonamide Antibiotics), contrast dye, budesonide, iohexol, simvastatin, Demerol, meperidine, adhesive tape, naproxen, pregabalin Notifications Medication Indication Start End lidocaine DOSE 1 - topical 4 % cream - 1 cream topical cefdinir 03/13/2018 DOSE 1 - oral 300 mg capsule - 1 capsule oral taken 2 times a day for 14 days or until follow-up with Infectious disease Electronic Signature(s) AKONI, PARTON ([id number]) Signed: 03/13/2018 5:27:51 PM By: Worthy Keeler PA-C Entered By: Worthy Keeler on 03/13/2018 17:27:51 ALOYSUIS, RIBAUDO ([id number]) -------------------------------------------------------------------------------- Prescription 03/13/2018 Patient Name: [name] Provider: Worthy Keeler PA-C Date of Birth: 02/01/1931 NPI#: [id number] Sex: M DEA#: [id number] Phone #: [phone]  License #: Patient Address: [facility], [place] [address] [address] [place], [place] [address] [phone] Allergies Lyrica  gabapentin Sulfa (Sulfonamide Antibiotics) contrast dye budesonide iohexol simvastatin Demerol meperidine adhesive tape naproxen pregabalin Medication Medication: Route: Strength: Form: lidocaine 4 % topical cream topical 4% cream Class: TOPICAL LOCAL ANESTHETICS Indication: HARLAN, ERVINE ([id number]) Dose: Frequency / Time: 1 1 cream topical Number of Refills: Number of Units: 0 Generic Substitution: Start Date: End Date: One Time Use: Substitution Permitted No Note to Pharmacy: Signature(s): Date(s): Electronic Signature(s) Signed: 03/14/2018 8:29:14 AM By: Worthy Keeler PA-C Entered By: Worthy Keeler on 03/13/2018 17:27:52 Bennett, [name] ([id number]) --------------------------------------------------------------------------------  Problem List Details Patient Name: [name] Date of Service: 03/13/2018 3:15 PM Medical Record Number: [id number] Patient Account Number: [id number] Date of Birth/Sex: September 20, 1931 (84 y.o. M) Treating RN: Ahmed Prima Primary Care Provider: Burman Freestone Other Clinician: Referring Provider: Burman Freestone Treating Provider/Extender: [name], [place] Hagmann Weeks in Treatment: 5 Active Problems ICD-10 Impacting Encounter Code Description Active Date Wound Healing Diagnosis L89.154 Pressure ulcer of sacral region, stage 4 02/06/2018 No Yes L24.0 Irritant contact dermatitis due to detergents 02/06/2018 No Yes Inactive Problems Resolved Problems Electronic Signature(s) Signed: 03/14/2018 8:29:14 AM By: Worthy Keeler PA-C Entered By: Worthy Keeler on 03/13/2018 15:41:09 [name]  ([id number]) -------------------------------------------------------------------------------- Progress Note Details Patient Name: [name] Date of Service: 03/13/2018 3:15 PM Medical Record Number: [id number] Patient Account Number: [id number] Date of Birth/Sex: Nov 12, 1930 (84 y.o. M) Treating RN: Ahmed Prima Primary Care Provider: Burman Freestone Other Clinician: Referring Provider: Burman Freestone Treating Provider/Extender: [name], Greysin Medlen Weeks in Treatment: 5 Subjective Chief Complaint Information obtained from Patient Sacral pressure ulcer and left shoulder rash History of Present Illness (HPI) 02/06/18 on evaluation today patient presents for initial evaluation and our clinic concerning an issue which began roughly 3 weeks ago when the patient fell in his home on the floor in his kitchen and laid him down this detergent for roughly 3 days. He had a pressure injury to the left shoulder. This unfortunately has caused him a lot of discomfort although it finally seems to be doing better if anything is really having a lot of itching right now. This appears potentially be a contact dermatitis issue. He also has a significant pressure injury to the sacrum at this time as well which is also showing fascia exposure right over the bone but no evidence of bone exposure at this point which is good news. They have been using Santyl as well as Saline soaked gauze at this point in time. There does appear to be a lot of necrotic slough in the base of the wound. He does have a history of incontinence, myocardial infarction, and hypertension. He also is "borderline diabetic" hemoglobin A1c of 6.0. Currently he has some discomfort in the pressure site at the sacrum but fortunately nothing too significant this did require sharp debridement today. 02/13/18 on evaluation today patient appears to be doing much better in regard to his sacral wound. He has been tolerating the dressing changes  without complication with the Vashe. Fortunately there is no evidence of infection and though there is some Slough on the surface of the wound bed he has excellent granulation noted. Overall I'm pleased with how things have progressed in that regard. A glance at his shoulder as well and the rash seems to be someone improving in my pinion at this site as well. Overall I am pleased with what we're seeing and so is the family. 02/20/18 on evaluation today patient appears to be doing a little worse in regard to  the sacral wound only in the fact that there is redness surrounding it has me somewhat concerned for infection. The drainage has also apparently been a little bit off color compared to normal according to family they did keep the dressing today that was removed to show me and I agree this seems to be a little bit different compared to what we have been seeing. Coupled with the redness I'm concerned he may be developing some cellulitis surrounding the wound bed. 02/27/18 on evaluation today patient presents for follow-up concerning his sacral ulcer. We have received the results back from his wound culture which shows unfortunately that the doxycycline will not be of benefit for him I am going to need to initiate treatment with something else in order to treat the pseudomonas. Otherwise he does not seem to be having any significant pain although his daughter states there are sometimes when he states having pain. We continue to use the Vashe currently. 03/06/18 on evaluation today patient's sacral wound appears to be doing better in my opinion. He has been tolerating the dressing changes without complication. With that being said the silver nitrate has helped with the prominent area of hyper granulation at the 6 o'clock location we will likely need to repeat this again today. Nonetheless overall I am pleased with how things have improved over the last week. The erythema surrounding the wound seems to be  greatly improved. 03/13/18 on evaluation today patient's wound actually does not appear to be terribly infected although she does continue to have erythema surrounding the wound bed especially on the left border. I'm still somewhat concerned about the fact that the oral antibiotics alone may not be completely treating his infection. I previously discussed may need to go for IV antibiotic therapy I'm concerned that may be the case. We will need to make a referral today for infectious disease. Patient History Information obtained from Patient. BOND, GRIESHOP ([id number]) Family History Cancer - Paternal Grandparents, Diabetes - Father, Heart Disease - Mother,Father, Stroke - Father, No family history of Hypertension, Kidney Disease, Lung Disease, Seizures, Thyroid Problems, Tuberculosis. Social History Never smoker, Marital Status - Widowed, Alcohol Use - Never, Drug Use - No History, Caffeine Use - Daily. Medical History Hospitalization/Surgery History - 01/20/2018, [facility], [place]. 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 Respiratory The patient has no complaints or symptoms. Cardiovascular The patient has no complaints or symptoms. Psychiatric The patient has no complaints or symptoms. Objective Constitutional Well-nourished and well-hydrated in no acute distress. Vitals Time Taken: 3:46 PM, Height: 69 in, Weight: 155 lbs, BMI: 22.9, Temperature: 98.6 F, Pulse: 68 bpm, Respiratory Rate: 16 breaths/min, Blood Pressure: 159/62 mmHg. Respiratory normal breathing without difficulty. clear to auscultation bilaterally. Cardiovascular regular rate and rhythm with normal S1, S2. Psychiatric this patient is able to make decisions and demonstrates good insight into disease process. Alert and Oriented x 3. pleasant and  cooperative. General Notes: At this time patient's wound did not require any sharp debridement I really did not feel I needed to perform silver nitrate at this point on the hyper granular areas this appear to be doing fairly well. Overall I'm pleased in that regard although the erythema surrounding the wound does not have me at ease and I do think that he likely needs a referral to LESLIE, LANGILLE. ([id number]) infectious disease in order to get this under control. I believe  he may in the end require IV antibiotic therapy although obviously I'll leave this to the discretion of infectious disease. Integumentary (Hair, Skin) Wound #1 status is Open. Original cause of wound was Pressure Injury. The wound is located on the Sacrum. The wound measures 4.2cm length x 3.2cm width x 1.8cm depth; 10.556cm^2 area and 19cm^3 volume. There is Fat Layer (Subcutaneous Tissue) Exposed exposed. There is no tunneling or undermining noted. There is a large amount of purulent drainage noted. The wound margin is distinct with the outline attached to the wound base. There is large (67-100%) red, hyper - granulation within the wound bed. There is a small (1-33%) amount of necrotic tissue within the wound bed including Adherent Slough. The periwound skin appearance exhibited: Excoriation, Erythema. The periwound skin appearance did not exhibit: Callus, Crepitus, Induration, Rash, Scarring, Dry/Scaly, Maceration, Atrophie Blanche, Cyanosis, Ecchymosis, Hemosiderin Staining, Mottled, Pallor, Rubor. The surrounding wound skin color is noted with erythema which is circumferential. Periwound temperature was noted as No Abnormality. The periwound has tenderness on palpation. Assessment Active Problems ICD-10 L89.154 - Pressure ulcer of sacral region, stage 4 L24.0 - Irritant contact dermatitis due to detergents Plan Wound Cleansing: Wound #1 Sacrum: Clean wound with Normal Saline. Anesthetic (add to Medication  List): Wound #1 Sacrum: Topical Lidocaine 4% cream applied to wound bed prior to debridement (In Clinic Only). Skin Barriers/Peri-Wound Care: Wound #1 Sacrum: Barrier cream Primary Wound Dressing: Wound #1 Sacrum: Other: - 3 inch conform soaked in Vashe or Dakins and packed into wound Secondary Dressing: Wound #1 Sacrum: ABD pad Dry Gauze Other - Drawtex or equal Dressing Change Frequency: Wound #1 Sacrum: Change dressing every day. Change Dressing Monday, Wednesday, Friday - NPWT when applied Follow-up Appointments: Wound #1 Sacrum: Return Appointment in 1 week. Off-Loading: Wound #1 SacrumABOU, STERKEL ([id number]) Mattress - air mattress wound care will order Turn and reposition every 2 hours Other: - hospital bed wound care will order Additional Orders / Instructions: Wound #1 Sacrum: Increase protein intake. Home Health: Wound #1 Sacrum: Continue Home Health Visits - [facility]- Please order NPWT. If wound center needs to order, please call Kim @ [place] Nurse may visit PRN to address patient s wound care needs. FACE TO FACE ENCOUNTER: MEDICARE and MEDICAID PATIENTS: I certify that this patient is under my care and that I had a face-to-face encounter that meets the physician face-to-face encounter requirements with this patient on this date. The encounter with the patient was in whole or in part for the following MEDICAL CONDITION: (primary reason for [place]) MEDICAL NECESSITY: I certify, that based on my findings, NURSING services are a medically necessary home health service. HOME BOUND STATUS: I certify that my clinical findings support that this patient is homebound (i.e., Due to illness or injury, pt requires aid of supportive devices such as crutches, cane, wheelchairs, walkers, the use of special transportation or the assistance of another person to leave their place of residence. There is a normal inability to leave the home and  doing so requires considerable and taxing effort. Other absences are for medical reasons / religious services and are infrequent or of short duration when for other reasons). If current dressing causes regression in wound condition, may D/C ordered dressing product/s and apply Normal Saline Moist Dressing daily until next [place] / Other MD appointment. [place] of regression in wound condition at [phone]. Please direct any NON-WOUND related issues/requests for orders to patient's Primary Care Physician  Negative Pressure Wound Therapy: Wound #1 Sacrum: Wound VAC settings at 125/130 mmHg continuous pressure. Use BLACK/GREEN foam to wound cavity. Use WHITE foam to fill any tunnel/s and/or undermining. Change VAC dressing 3 X WEEK. Change canister as indicated when full. Nurse may titrate settings and frequency of dressing changes as clinically indicated. Home Health Nurse may d/c VAC for s/s of increased infection, significant wound regression, or uncontrolled drainage. [place] at [phone]. Medications-please add to medication list.: Wound #1 Sacrum: Other: - TAC on back and shoulder Consults ordered were: Infectious Disease - [place] The following medication(s) was prescribed: lidocaine topical 4 % cream 1 1 cream topical was prescribed at facility cefdinir oral 300 mg capsule 1 1 capsule oral taken 2 times a day for 14 days or until follow-up with Infectious disease starting 03/13/2018 I am going to go ahead and initiate treatment with a air mattress at this point in time. Patient is in agreement with the plan as is his daughter. Subsequently were going to also go ahead and make a referral to infectious disease for further evaluation in regard to IV antibiotic therapy although I will go ahead and initiate treatment with Omnicef which she has Artie been on what I am going to continue for time. Hopefully will not take too long to  get him in with infectious disease. At least this seems to be keeping things at [place] so it's not worsening though I think he really needs more appropriate and likely IV antibiotic therapy. Unfortunately any or options that could be considered based on the culture results the patient is allergic to. We're gonna see were things stand at follow-up. Please see above for specific wound care orders. We will see patient for re-evaluation in 1 week(s) here in the clinic. If anything worsens or changes patient will contact our office for additional recommendations. Electronic Signature(s) Signed: 03/14/2018 8:29:14 AM By: Molli Barrows ([id number]) Entered By: Worthy Keeler on 03/13/2018 17:28:21 [name] ([id number]) -------------------------------------------------------------------------------- ROS/PFSH Details Patient Name: ANDREY, MCCASKILL Date of Service: 03/13/2018 3:15 PM Medical Record Number: [id number] Patient Account Number: [id number] Date of Birth/Sex: April 28, 1931 (84 y.o. M) Treating RN: Ahmed Prima Primary Care Provider: Burman Freestone Other Clinician: Referring Provider: Burman Freestone Treating Provider/Extender: [name], Cuthbert Turton Weeks in Treatment: 5 Information Obtained From Patient Wound History Do you currently have one or more open woundso Yes How many open wounds do you currently haveo 2 Approximately how long have you had your woundso 3 weeks Has your wound(s) ever healed and then re-openedo Yes Have you had any lab work done in the past montho Yes Have you tested positive for osteomyelitis (bone infection)o No Have you had any tests for circulation on your legso No Constitutional Symptoms (General Health) Complaints and Symptoms: Negative for: Fever; Chills Eyes Medical History: Positive for: Cataracts - bilateral removal Negative for: Glaucoma; Optic Neuritis Ear/Nose/Mouth/Throat Medical History: Negative for: Chronic  sinus problems/congestion; Middle ear problems Hematologic/Lymphatic Medical History: Negative for: Anemia; Hemophilia; Human Immunodeficiency Virus; Lymphedema; Sickle Cell Disease Respiratory Complaints and Symptoms: No Complaints or Symptoms Medical History: Positive for: Asthma Negative for: Aspiration; Chronic Obstructive Pulmonary Disease (COPD); Pneumothorax; Sleep Apnea; Tuberculosis Cardiovascular Complaints and Symptoms: No Complaints or Symptoms Medical History: Positive for: Angina; Arrhythmia; Coronary Artery Disease; Hypertension; Myocardial Infarction - 2001 Negative for: Congestive Heart Failure; Deep Vein Thrombosis; Hypotension; Peripheral Arterial Disease; Peripheral SHAWAN, CORELLA ([id number]) Venous Disease; Phlebitis; Vasculitis Gastrointestinal Medical History: Negative for: Cirrhosis ;  Colitis; Crohnos; Hepatitis A; Hepatitis B; Hepatitis C Endocrine Medical History: Negative for: Type I Diabetes; Type II Diabetes Past Medical History Notes: Borderline Genitourinary Medical History: Negative for: End Stage Renal Disease Immunological Medical History: Negative for: Lupus Erythematosus; Raynaudos; Scleroderma Integumentary (Skin) Medical History: Negative for: History of Burn; History of pressure wounds Musculoskeletal Medical History: Positive for: Osteoarthritis Negative for: Gout; Rheumatoid Arthritis; Osteomyelitis Neurologic Medical History: Positive for: Dementia; Neuropathy Negative for: Quadriplegia; Paraplegia; Seizure Disorder Oncologic Medical History: Negative for: Received Chemotherapy; Received Radiation Past Medical History Notes: Melanoma on back Psychiatric Complaints and Symptoms: No Complaints or Symptoms Medical History: Negative for: Anorexia/bulimia; Confinement Anxiety HBO Extended History Items Eyes: Cataracts PHINEAS, MCENROE ([id number]) Immunizations Pneumococcal Vaccine: Received Pneumococcal Vaccination:  Yes Implantable Devices Hospitalization / Surgery History Name of Hospital Purpose of Hospitalization/Surgery Date [facility] 01/20/2018 Family and Social History Cancer: Yes - Paternal Grandparents; Diabetes: Yes - Father; Heart Disease: Yes - Mother,Father; Hypertension: No; Kidney Disease: No; Lung Disease: No; Seizures: No; Stroke: Yes - Father; Thyroid Problems: No; Tuberculosis: No; Never smoker; Marital Status - Widowed; Alcohol Use: Never; Drug Use: No History; Caffeine Use: Daily; Financial Concerns: No; Food, Clothing or Shelter Needs: No; Support System Lacking: No; Transportation Concerns: No; Advanced Directives: Yes (Not Provided); Patient does not want information on Advanced Directives; Do not resuscitate: Yes (Not Provided); Living Will: Yes (Not Provided); Medical Power of Attorney: Yes - Ahaan Zobrist (Not Provided) Physician Affirmation I have reviewed and agree with the above information. Electronic Signature(s) Signed: 03/14/2018 8:29:14 AM By: Worthy Keeler PA-C Signed: 03/16/2018 5:38:20 PM By: Alric Quan Entered By: Worthy Keeler on 03/13/2018 17:24:59 Edilson, Vital [name] ([id number]) -------------------------------------------------------------------------------- SuperBill Details Patient Name: [name] Date of Service: 03/13/2018 Medical Record Number: [id number] Patient Account Number: [id number] Date of Birth/Sex: 12-21-1930 (84 y.o. M) Treating RN: Ahmed Prima Primary Care Provider: Burman Freestone Other Clinician: Referring Provider: Burman Freestone Treating Provider/Extender: [name], Kyria Bumgardner Weeks in Treatment: 5 Diagnosis Coding ICD-10 Codes Code Description L89.154 Pressure ulcer of sacral region, stage 4 L24.0 Irritant contact dermatitis due to detergents Facility Procedures CPT4 Code: [id number] Description: [place] VISIT-LEV 3 EST PT Modifier: Quantity: 1 Physician Procedures CPT4 Code: [id number] Description: 76195 -  [facility] PHYS LEVEL 3 - EST PT ICD-10 Diagnosis Description L89.154 Pressure ulcer of sacral region, stage 4 L24.0 Irritant contact dermatitis due to detergents Modifier: Quantity: 1 Electronic Signature(s) Signed: 03/13/2018 5:47:03 PM By: Alric Quan Signed: 03/14/2018 8:29:14 AM By: Worthy Keeler PA-C Entered By: Alric Quan on 03/13/2018 17:47:03

## 2018-03-19 NOTE — Telephone Encounter (Signed)
Informed patient's daughter of recommendations.  Advised to call back if edema does not improve or worsens.

## 2018-03-20 ENCOUNTER — Encounter: Payer: Medicare Other | Attending: Physician Assistant | Admitting: Physician Assistant

## 2018-03-20 DIAGNOSIS — G629 Polyneuropathy, unspecified: Secondary | ICD-10-CM | POA: Insufficient documentation

## 2018-03-20 DIAGNOSIS — M1990 Unspecified osteoarthritis, unspecified site: Secondary | ICD-10-CM | POA: Insufficient documentation

## 2018-03-20 DIAGNOSIS — L240 Irritant contact dermatitis due to detergents: Secondary | ICD-10-CM | POA: Insufficient documentation

## 2018-03-20 DIAGNOSIS — I252 Old myocardial infarction: Secondary | ICD-10-CM | POA: Insufficient documentation

## 2018-03-20 DIAGNOSIS — F0390 Unspecified dementia without behavioral disturbance: Secondary | ICD-10-CM | POA: Insufficient documentation

## 2018-03-20 DIAGNOSIS — I2510 Atherosclerotic heart disease of native coronary artery without angina pectoris: Secondary | ICD-10-CM | POA: Diagnosis not present

## 2018-03-20 DIAGNOSIS — I10 Essential (primary) hypertension: Secondary | ICD-10-CM | POA: Insufficient documentation

## 2018-03-20 DIAGNOSIS — J45909 Unspecified asthma, uncomplicated: Secondary | ICD-10-CM | POA: Diagnosis not present

## 2018-03-20 DIAGNOSIS — R7309 Other abnormal glucose: Secondary | ICD-10-CM | POA: Diagnosis not present

## 2018-03-20 DIAGNOSIS — L89154 Pressure ulcer of sacral region, stage 4: Secondary | ICD-10-CM | POA: Insufficient documentation

## 2018-03-20 DIAGNOSIS — Z85820 Personal history of malignant melanoma of skin: Secondary | ICD-10-CM | POA: Insufficient documentation

## 2018-03-21 NOTE — Progress Notes (Signed)
SHANKAR, SILBER ([id number]) Visit Report for 03/20/2018 Arrival Information Details Patient Name: [name], [name]. Date of Service: 03/20/2018 10:00 AM Medical Record Number: [id number] Patient Account Number: [id number] Date of Birth/Sex: 06-14-1931 (84 y.o. M) Treating RN: Secundino Ginger Primary Care Jakari Jacot: Burman Freestone Other Clinician: Referring Kourtnei Rauber: Burman Freestone Treating Jaimes Eckert/Extender: [name], HOYT Weeks in Treatment: 6 Visit Information History Since Last Visit Added or deleted any medications: No Patient Arrived: Ambulatory Any new allergies or adverse reactions: No Arrival Time: 10:37 Had a fall or experienced change in No Accompanied By: daughters activities of daily living that may affect Transfer Assistance: None risk of falls: Patient Requires Transmission-Based No Signs or symptoms of abuse/neglect since last visito No Precautions: Hospitalized since last visit: No Patient Has Alerts: Yes Implantable device outside of the clinic excluding No cellular tissue based products placed in the center since last visit: Has Dressing in Place as Prescribed: Yes Pain Present Now: Yes Notes pt stated taking hydrocodone with some relief . pt seen by pain specialist Electronic Signature(s) Signed: 03/20/2018 11:57:38 AM By: Secundino Ginger Entered By: Secundino Ginger on 03/20/2018 10:40:02 [name] ([id number]) -------------------------------------------------------------------------------- Clinic Level of Care Assessment Details Patient Name: [name] Date of Service: 03/20/2018 10:00 AM Medical Record Number: [id number] Patient Account Number: [id number] Date of Birth/Sex: [birth year] (84 y.o. M) Treating RN: Ahmed Prima Primary Care Nycole Kawahara: Burman Freestone Other Clinician: Referring Brita Jurgensen: Burman Freestone Treating Taniela Feltus/Extender: [name], HOYT Weeks in Treatment: 6 Clinic Level of Care Assessment Items TOOL 4 Quantity Score X - Use when  only an EandM is performed on FOLLOW-UP visit 1 0 ASSESSMENTS - Nursing Assessment / Reassessment X - Reassessment of Co-morbidities (includes updates in patient status) 1 10 X- 1 5 Reassessment of Adherence to Treatment Plan ASSESSMENTS - Wound and Skin Assessment / Reassessment []  - Simple Wound Assessment / Reassessment - one wound 0 []  - 0 Complex Wound Assessment / Reassessment - multiple wounds []  - 0 Dermatologic / Skin Assessment (not related to wound area) ASSESSMENTS - Focused Assessment X - Circumferential Edema Measurements - multi extremities 1 5 []  - 0 Nutritional Assessment / Counseling / Intervention []  - 0 Lower Extremity Assessment (monofilament, tuning fork, pulses) []  - 0 Peripheral Arterial Disease Assessment (using hand held doppler) ASSESSMENTS - Ostomy and/or Continence Assessment and Care []  - Incontinence Assessment and Management 0 []  - 0 Ostomy Care Assessment and Management (repouching, etc.) PROCESS - Coordination of Care []  - Simple Patient / Family Education for ongoing care 0 X- 1 20 Complex (extensive) Patient / Family Education for ongoing care X- 1 10 Staff obtains Programmer, systems, Records, Test Results / Process Orders X- 1 10 Staff telephones HHA, Nursing Homes / Clarify orders / etc []  - 0 Routine Transfer to another Facility (non-emergent condition) []  - 0 Routine Hospital Admission (non-emergent condition) []  - 0 New Admissions / Biomedical engineer / Ordering NPWT, Apligraf, etc. []  - 0 Emergency Hospital Admission (emergent condition) X- 1 10 Simple Discharge Coordination [name], [name] ([id number]) []  - 0 Complex (extensive) Discharge Coordination PROCESS - Special Needs []  - Pediatric / Minor Patient Management 0 []  - 0 Isolation Patient Management []  - 0 Hearing / Language / Visual special needs []  - 0 Assessment of Community assistance (transportation, D/C planning, etc.) []  - 0 Additional assistance / Altered  mentation []  - 0 Support Surface(s) Assessment (bed, cushion, seat, etc.) INTERVENTIONS - Wound Cleansing / Measurement X - Simple Wound Cleansing - one wound 1 5 []  -  0 Complex Wound Cleansing - multiple wounds X- 1 5 Wound Imaging (photographs - any number of wounds) []  - 0 Wound Tracing (instead of photographs) X- 1 5 Simple Wound Measurement - one wound []  - 0 Complex Wound Measurement - multiple wounds INTERVENTIONS - Wound Dressings X - Small Wound Dressing one or multiple wounds 1 10 []  - 0 Medium Wound Dressing one or multiple wounds []  - 0 Large Wound Dressing one or multiple wounds X- 1 5 Application of Medications - topical []  - 0 Application of Medications - injection INTERVENTIONS - Miscellaneous []  - External ear exam 0 []  - 0 Specimen Collection (cultures, biopsies, blood, body fluids, etc.) []  - 0 Specimen(s) / Culture(s) sent or taken to Lab for analysis []  - 0 Patient Transfer (multiple staff / Civil Service fast streamer / Similar devices) []  - 0 Simple Staple / Suture removal (25 or less) []  - 0 Complex Staple / Suture removal (26 or more) []  - 0 Hypo / Hyperglycemic Management (close monitor of Blood Glucose) []  - 0 Ankle / Brachial Index (ABI) - do not check if billed separately X- 1 5 Vital Signs [name], [name] ([id number]) Has the patient been seen at the hospital within the last three years: Yes Total Score: 105 Level Of Care: New/Established - Level 3 Electronic Signature(s) Signed: 03/20/2018 5:20:44 PM By: Alric Quan Entered By: Alric Quan on 03/20/2018 11:31:00 [name] ([id number]) -------------------------------------------------------------------------------- Encounter Discharge Information Details Patient Name: [name] Date of Service: 03/20/2018 10:00 AM Medical Record Number: [id number] Patient Account Number: [id number] Date of Birth/Sex: 08-23-1931 (84 y.o. M) Treating RN: Ahmed Prima Primary Care Dula Havlik:  Burman Freestone Other Clinician: Referring Chala Gul: Burman Freestone Treating Malayah Demuro/Extender: [name], HOYT Weeks in Treatment: 6 Encounter Discharge Information Items Schedule Follow-up Appointment: No Clinical Summary of Care: Provided Form Type Recipient Paper Patient ar Electronic Signature(s) Signed: 03/20/2018 11:46:06 AM By: Lorine Bears RCP, RRT, CHT Entered By: Lorine Bears on 03/20/2018 11:46:06 [name] ([id number]) -------------------------------------------------------------------------------- Lower Extremity Assessment Details Patient Name: [name], [name]. Date of Service: 03/20/2018 10:00 AM Medical Record Number: [id number] Patient Account Number: [id number] Date of Birth/Sex: 1930-12-08 (84 y.o. M) Treating RN: Secundino Ginger Primary Care Annison Birchard: Burman Freestone Other Clinician: Referring Ariana Juul: Burman Freestone Treating Stephaniemarie Stoffel/Extender: [name], HOYT Weeks in Treatment: 6 Electronic Signature(s) Signed: 03/20/2018 11:54:52 AM By: Secundino Ginger Entered By: Secundino Ginger on 03/20/2018 11:54:52 [name] ([id number]) -------------------------------------------------------------------------------- Multi Wound Chart Details Patient Name: [name] Date of Service: 03/20/2018 10:00 AM Medical Record Number: [id number] Patient Account Number: [id number] Date of Birth/Sex: July 14, 1931 (84 y.o. M) Treating RN: Ahmed Prima Primary Care Fawn Desrocher: Burman Freestone Other Clinician: Referring Vonzell Lindblad: Burman Freestone Treating Saumya Hukill/Extender: [name], HOYT Weeks in Treatment: 6 Vital Signs Height(in): 69 Pulse(bpm): 75 Weight(lbs): 155 Blood Pressure(mmHg): 157/80 Body Mass Index(BMI): 23 Temperature(F): 98.4 Respiratory Rate 16 (breaths/min): Photos: [1:No Photos] [N/A:N/A] Wound Location: [1:Sacrum] [N/A:N/A] Wounding Event: [1:Pressure Injury] [N/A:N/A] Primary Etiology: [1:Pressure Ulcer]  [N/A:N/A] Comorbid History: [1:Cataracts, Asthma, Angina, Arrhythmia, Coronary Artery Disease, Hypertension, Myocardial Infarction, Osteoarthritis, Dementia, Neuropathy] [N/A:N/A] Date Acquired: [1:01/17/2018] [N/A:N/A] Weeks of Treatment: [1:6] [N/A:N/A] Wound Status: [1:Open] [N/A:N/A] Measurements L x W x D [1:4x3.5x2] [N/A:N/A] (cm) Area (cm) : [1:10.996] [N/A:N/A] Volume (cm) : [1:21.991] [N/A:N/A] % Reduction in Area: [1:46.20%] [N/A:N/A] % Reduction in Volume: [1:46.20%] [N/A:N/A] Starting Position 1 [1:1] (o'clock): Ending Position 1 [1:12] (o'clock): Maximum Distance 1 (cm): [1:1.3] Undermining: [1:Yes] [N/A:N/A] Classification: [1:Category/Stage III] [N/A:N/A] Exudate Amount: [1:Large] [N/A:N/A] Exudate  Type: [1:Purulent] [N/A:N/A] Exudate Color: [1:yellow, brown, green] [N/A:N/A] Wound Margin: [1:Distinct, outline attached] [N/A:N/A] Granulation Amount: [1:Large (67-100%)] [N/A:N/A] Granulation Quality: [1:Red, Pink, Hyper-granulation] [N/A:N/A] Necrotic Amount: [1:Small (1-33%)] [N/A:N/A] Exposed Structures: [1:Fat Layer (Subcutaneous Tissue) Exposed: Yes Fascia: No Tendon: No] [N/A:N/A] Muscle: No Joint: No Bone: No Epithelialization: None N/A N/A Periwound Skin Texture: Excoriation: Yes N/A N/A Induration: No Callus: No Crepitus: No Rash: No Scarring: No Periwound Skin Moisture: Maceration: No N/A N/A Dry/Scaly: No Periwound Skin Color: Erythema: Yes N/A N/A Atrophie Blanche: No Cyanosis: No Ecchymosis: No Hemosiderin Staining: No Mottled: No Pallor: No Rubor: No Erythema Location: Circumferential N/A N/A Temperature: No Abnormality N/A N/A Tenderness on Palpation: Yes N/A N/A Wound Preparation: Ulcer Cleansing: N/A N/A Rinsed/Irrigated with Saline Topical Anesthetic Applied: Other: lidocaine 4% Treatment Notes Electronic Signature(s) Signed: 03/20/2018 5:20:44 PM By: Alric Quan Entered By: Alric Quan on 03/20/2018  11:14:44 [name] ([id number]) -------------------------------------------------------------------------------- [place] Details Patient Name: [name], [name]. Date of Service: 03/20/2018 10:00 AM Medical Record Number: [id number] Patient Account Number: [id number] Date of Birth/Sex: 1931-06-04 (84 y.o. M) Treating RN: Ahmed Prima Primary Care Rainah Kirshner: Burman Freestone Other Clinician: Referring Ivory Maduro: Burman Freestone Treating Andres Escandon/Extender: [name], HOYT Weeks in Treatment: 6 Active Inactive ` Abuse / Safety / Falls / Self Care Management Nursing Diagnoses: History of Falls Goals: Patient will not experience any injury related to falls Date Initiated: 02/06/2018 Target Resolution Date: 03/08/2018 Goal Status: Active Interventions: Assess fall risk on admission and as needed Treatment Activities: Patient referred to home care : 02/06/2018 Notes: ` Necrotic Tissue Nursing Diagnoses: Impaired tissue integrity related to necrotic/devitalized tissue Goals: Necrotic/devitalized tissue will be minimized in the wound bed Date Initiated: 02/06/2018 Target Resolution Date: 03/08/2018 Goal Status: Active Interventions: Assess patient pain level pre-, during and post procedure and prior to discharge Treatment Activities: Excisional debridement : 02/06/2018 Notes: ` Nutrition Nursing Diagnoses: Potential for alteratiion in Nutrition/Potential for imbalanced nutrition [name], [name] ([id number]) Goals: Patient/caregiver agrees to and verbalizes understanding of need to obtain nutritional consultation Date Initiated: 02/06/2018 Target Resolution Date: 03/08/2018 Goal Status: Active Interventions: Provide education on nutrition Notes: ` Orientation to the Wound Care Program Nursing Diagnoses: Knowledge deficit related to the wound healing center program Goals: Patient/caregiver will verbalize understanding of the [place]  Program Date Initiated: 02/06/2018 Target Resolution Date: 03/08/2018 Goal Status: Active Interventions: Provide education on orientation to the wound center Notes: ` Pressure Nursing Diagnoses: Knowledge deficit related to management of pressures ulcers Goals: Patient/caregiver will verbalize understanding of pressure ulcer management Date Initiated: 02/06/2018 Target Resolution Date: 03/08/2018 Goal Status: Active Interventions: Assess offloading mechanisms upon admission and as needed Provide education on pressure ulcers Notes: ` Wound/Skin Impairment Nursing Diagnoses: Knowledge deficit related to ulceration/compromised skin integrity Goals: Ulcer/skin breakdown will have a volume reduction of 80% by week 12 Date Initiated: 02/06/2018 Target Resolution Date: 04/08/2018 Goal Status: Active [name], [name] ([id number]) Interventions: Assess ulceration(s) every visit Treatment Activities: Skin care regimen initiated : 02/06/2018 Topical wound management initiated : 02/06/2018 Notes: Electronic Signature(s) Signed: 03/20/2018 5:20:44 PM By: Alric Quan Entered By: Alric Quan on 03/20/2018 11:14:32 [name] ([id number]) -------------------------------------------------------------------------------- Pain Assessment Details Patient Name: [name] Date of Service: 03/20/2018 10:00 AM Medical Record Number: [id number] Patient Account Number: [id number] Date of Birth/Sex: [birth year] (84 y.o. M) Treating RN: Secundino Ginger Primary Care Quandarius Nill: Burman Freestone Other Clinician: Referring Kaelyn Innocent: Burman Freestone Treating Bernarda Erck/Extender: [name], HOYT Weeks in Treatment: 6 Active Problems Location of Pain  Severity and Description of Pain Patient Has Paino Yes Site Locations Pain Location: Pain in Ulcers Rate the pain. Current Pain Level: 8 Character of Pain Describe the Pain: Sharp Pain Management and Medication Current Pain  Management: Medication: Yes Rest: Yes Activity: Yes How does your wound impact your activities of daily livingo Sleep: Yes Appetite: No Dressing: Yes Goals for Pain Management Topical or injectable lidocaine is offered to patient for acute pain when surgical debridement is performed. If needed, Patient is instructed to use over the counter pain medication for the following 24-48 hours after debridement. Wound care MDs do not prescribed pain medications. Patient has chronic pain or uncontrolled pain. Patient has been instructed to make an appointment with their Primary Care Physician for pain management Electronic Signature(s) Signed: 03/20/2018 11:57:38 AM By: Secundino Ginger Entered By: Secundino Ginger on 03/20/2018 10:45:23 [name] ([id number]) -------------------------------------------------------------------------------- Wound Assessment Details Patient Name: [name] Date of Service: 03/20/2018 10:00 AM Medical Record Number: [id number] Patient Account Number: [id number] Date of Birth/Sex: 04/12/1931 (84 y.o. M) Treating RN: Secundino Ginger Primary Care Sosaia Pittinger: Burman Freestone Other Clinician: Referring Iyah Laguna: Burman Freestone Treating Bayani Renteria/Extender: [name], HOYT Weeks in Treatment: 6 Wound Status Wound Number: 1 Primary Pressure Ulcer Etiology: Wound Location: Sacrum Wound Open Wounding Event: Pressure Injury Status: Date Acquired: 01/17/2018 Comorbid Cataracts, Asthma, Angina, Arrhythmia, Weeks Of Treatment: 6 History: Coronary Artery Disease, Hypertension, Clustered Wound: No Myocardial Infarction, Osteoarthritis, Dementia, Neuropathy Photos Photo Uploaded By: Secundino Ginger on 03/20/2018 11:26:55 Wound Measurements Length: (cm) 4 % Reducti Width: (cm) 3.5 % Reducti Depth: (cm) 2 Epithelia Area: (cm) 10.996 Tunnelin Volume: (cm) 21.991 Undermin Starti Ending Maximu on in Area: 46.2% on in Volume: 46.2% lization: None g: No ing: Yes ng Position  (o'clock): 1 Position (o'clock): 12 m Distance: (cm) 1.3 Wound Description Classification: Category/Stage III Foul Odo Wound Margin: Distinct, outline attached Slough/F Exudate Amount: Large Exudate Type: Purulent Exudate Color: yellow, brown, green r After Cleansing: No ibrino Yes Wound Bed Granulation Amount: Large (67-100%) Exposed Structure Granulation Quality: Red, Pink, Hyper-granulation Fascia Exposed: No Necrotic Amount: Small (1-33%) Fat Layer (Subcutaneous Tissue) Exposed: Yes Necrotic Quality: Adherent Slough Tendon Exposed: No RADEN, BYINGTON ([id number]) Muscle Exposed: No Joint Exposed: No Bone Exposed: No Periwound Skin Texture Texture Color No Abnormalities Noted: No No Abnormalities Noted: No Callus: No Atrophie Blanche: No Crepitus: No Cyanosis: No Excoriation: Yes Ecchymosis: No Induration: No Erythema: Yes Rash: No Erythema Location: Circumferential Scarring: No Hemosiderin Staining: No Mottled: No Moisture Pallor: No No Abnormalities Noted: No Rubor: No Dry / Scaly: No Maceration: No Temperature / Pain Temperature: No Abnormality Tenderness on Palpation: Yes Wound Preparation Ulcer Cleansing: Rinsed/Irrigated with Saline Topical Anesthetic Applied: Other: lidocaine 4%, Electronic Signature(s) Signed: 03/20/2018 11:57:38 AM By: Secundino Ginger Entered By: Secundino Ginger on 03/20/2018 11:00:29 [name] ([id number]) -------------------------------------------------------------------------------- Vitals Details Patient Name: [name] Date of Service: 03/20/2018 10:00 AM Medical Record Number: [id number] Patient Account Number: [id number] Date of Birth/Sex: [birth year] (84 y.o. M) Treating RN: Secundino Ginger Primary Care Estell Dillinger: Burman Freestone Other Clinician: Referring Deshanta Lady: Burman Freestone Treating Vercie Pokorny/Extender: [name], HOYT Weeks in Treatment: 6 Vital Signs Time Taken: 10:30 Temperature (F): 98.4 Height (in):  69 Pulse (bpm): 69 Weight (lbs): 155 Respiratory Rate (breaths/min): 16 Body Mass Index (BMI): 22.9 Blood Pressure (mmHg): 157/80 Reference Range: 80 - 120 mg / dl Electronic Signature(s) Signed: 03/20/2018 11:57:38 AM By: Secundino Ginger Entered By: Secundino Ginger on 03/20/2018 10:48:01

## 2018-03-22 ENCOUNTER — Ambulatory Visit (INDEPENDENT_AMBULATORY_CARE_PROVIDER_SITE_OTHER): Payer: Medicare Other | Admitting: Infectious Diseases

## 2018-03-22 ENCOUNTER — Encounter: Payer: Self-pay | Admitting: Infectious Diseases

## 2018-03-22 VITALS — BP 165/71 | HR 73 | Temp 98.4°F | Wt 170.0 lb

## 2018-03-22 DIAGNOSIS — S31000A Unspecified open wound of lower back and pelvis without penetration into retroperitoneum, initial encounter: Secondary | ICD-10-CM

## 2018-03-22 DIAGNOSIS — Z5181 Encounter for therapeutic drug level monitoring: Secondary | ICD-10-CM

## 2018-03-22 DIAGNOSIS — L89154 Pressure ulcer of sacral region, stage 4: Secondary | ICD-10-CM | POA: Diagnosis present

## 2018-03-22 NOTE — Patient Instructions (Addendum)
Nice to meet you all today!   I would like to talk more with Margarita Grizzle about your wound. I think that debridement of the wound edges is likely going to be necessary to make sure this wounds heals up for you properly. Even with antibiotics and a wound vac if the edges are not at a point to further "heal in" the wound will likely remain as is.   We may need to do some IV antibiotics - after the blood work is back and I discuss with Margarita Grizzle I will be happy to give you a call to discuss again.   I would recommend compression stockings (knee high) 15-20 mmHg or 20-30 mmHg pressure. He has a lot of extra fluid in his legs and I worry about falls. Would also let your cardiology team know that he had significant swelling today.   There is a spot on the lower right heel that is dark that I would like for you to watch and discuss with wound care team.    PICC Insertion A peripherally inserted central catheter (PICC) is a long, thin, flexible tube that is inserted into a vein in the upper arm. It is a form of IV access. It is considered to be a "central" line because the tip of the PICC ends in a large vein in your chest. This large vein is called the superior vena cava (SVC). The PICC tip ends in the SVC because there is a lot of blood flow in the SVC. This allows medicines and IV fluids to be quickly distributed throughout the body. The PICC is inserted using a sterile technique by a specially trained health care provider. After the PICC is inserted, a chest X-ray may be done to ensure that it is in the correct place. Tell a health care provider about:  Any allergies you have.  All medicines you are taking, including vitamins, herbs, eye drops, creams, and over-the-counter medicines.  Any problems you or family members have had with anesthetic medicines.  Any blood disorders you have.  Any surgeries you have had.  Any medical conditions you have. What are the risks? Generally, this is a safe procedure.  However, as with any procedure, complications can occur. Possible complications include:  Infection at the insertion site or in the blood.  Bleeding at the insertion site or internally.  Injury or collapse of the lung.  Movement or malposition of the PICC.  Inflammation of the vein (phlebitis).  Nerve injury or irritation.  Clot formation at the tip of the PICC line.  Blood clot in the lung (pulmonary embolus).  Injury to the large blood vessels or heart (rare).  What happens before the procedure?  Your health care provider may want you to have blood tests. These tests can help tell how well your blood clots.  If you take blood thinners (anticoagulant medicine), ask your health care provider if you should stop taking them. What happens during the procedure?  You will have to lie flat for about 30-45 minutes for this procedure.  Your health care provider will start by identifying a vein into which the PICC line will be placed. This is done using ultrasound or X-ray guidance.  Medicine is used to numb the skin around the insertion site.  The skin where the catheter will be inserted is cleaned and covered with a sterile surgical drape.  A small needle is inserted into the vein, and then a small guidewire is advanced into the superior vena cava.  The catheter is then advanced over the guidewire and moved into position. The guidewire is then removed.  The catheter is flushed and blood is drawn back to confirm it is in the vein.  If this is done without X-ray guidance, an X-ray will be needed to make sure the catheter tip is in the correct position.  Once the placement is confirmed, the PICC is secured to the skin with a device and covered with a sterile dressing. What happens after the procedure?  You should avoid any strenuous activity for the next 2 days or as directed by your health care provider.  You will be allowed to go back to your regular activities after the  procedure.  You will be instructed on the care of your PICC line. This information is not intended to replace advice given to you by your health care provider. Make sure you discuss any questions you have with your health care provider. Document Released: 07/24/2013 Document Revised: 03/10/2016 Document Reviewed: 07/26/2013 Elsevier Interactive Patient Education  2017 [employer].

## 2018-03-22 NOTE — Assessment & Plan Note (Addendum)
84 y.o. male with chronic healing stage 4 sacral wound. Sensitive pseudomonas obtained on superficial swab. Currently he is maintained on Cefdinir. On exam today wound bed demonstrates pink granulation tissue with very minimal pale yellow slough (<10%). No drainage in wound bed aside from some yellow thin odorless drainage on gauze. Some bleeding noted with removal of dried packing. No bone is probed from the best I can tell. He does have some diffuse erythema along the left aspect of the wound extending to left gluteus but it is not warm in comparison to surrounding skin. He does have a fair amount of sacral edema present that I think is more the contributor of the erythema in addition to unrelieved pressure. He has not yet had been using a low air-loss mattress and spends most of his time sitting with a "donut" like pillow per his daughters' account. Wound edges appear to be rolled under to where I would recommend consideration of debridement as I fear the wound will never heal properly even with NPWT or antibiotics.   I suspect that his wound is colonized with pseudomonas but not currently causing infection. Cefdinir has no activity against pseudomonas and I would feel comfortable with stopping this to observe progress off antibiotics.  I tried to call and discuss with Margarita Grizzle but he is not in - will fax note and try to call and discuss next week.   We did discuss what treatment would look like with PICC line if we do need to treat this wound at some point - I would not feel comfortable using fluoroquinolone in this elderly patient on multiple QTc prolonging medications (amiodarone recently added). Would likely prefer Ceftaz vs Cefepime - whichever would be easier to dose for OPAT. I encouraged continued good nutrition and would love to see him use the low-airloss mattress at home. I am not sure if the donut pillow is helping him or hurting him.   I told Fernand and his daughters I would call them to discuss  further next week but at this point do not feel antibiotics are needed presently. Will discuss next steps for follow up at that phone call.

## 2018-03-22 NOTE — Progress Notes (Signed)
Patient: [name]  DOB: January 23, 1931 MRN: [id number] PCP: [name], FNP  Referring Provider: [facility]   Chief Complaint  Patient presents with  . New Patient (Initial Visit)    sacral ulcer      Patient Active Problem List   Diagnosis Date Noted  . Medication monitoring encounter 03/22/2018  . Malnutrition of moderate degree 01/22/2018  . Sacral decubitus ulcer, stage IV, healing  01/22/2018  . Atrial fibrillation ([place]) 01/21/2018  . Unresponsive episode 01/20/2018  . Elevated troponin 01/20/2018  . Leukocytosis 01/20/2018  . Pre-syncope 07/31/2017  . B12 deficiency 06/19/2017  . Acute encephalopathy 06/18/2017  . Essential hypertension   . Bladder outlet obstruction   . GI bleed 03/03/2014  . Anemia 03/03/2014  . Generalized weakness 03/03/2014  . ESOPHAGEAL STRICTURE 09/20/2010  . ORTHOSTATIC DIZZINESS 08/30/2010  . IBS 08/26/2010  . RECTAL BLEEDING 08/26/2010  . RECTAL PAIN 08/26/2010  . ARTHRITIS 08/26/2010  . LOSS OF APPETITE 08/26/2010  . OTHER DYSPHAGIA 08/26/2010  . Abdominal pain, generalized 08/26/2010  . COLONIC POLYPS, HX OF 08/26/2010  . ANAL FISSURE, HX OF 08/26/2010  . PALPITATIONS, HX OF 08/18/2010  . PERIPHERAL NEUROPATHY 05/01/2009  . History of myocardial infarction 05/01/2009  . ALLERGIC RHINITIS, SEASONAL 05/01/2009  . DEPRESSION, HX OF 05/01/2009  . Personal history of other diseases of digestive system 05/01/2009  . NEPHROLITHIASIS, HX OF 05/01/2009  . BENIGN PROSTATIC HYPERTROPHY, HX OF 05/01/2009  . LAMINECTOMY, HX OF 05/01/2009  . Hyperlipidemia 10/20/2008  . CAD (coronary artery disease) 10/20/2008  . ASTHMA, UNSPECIFIED, UNSPECIFIED STATUS 10/20/2008  . GERD 10/20/2008     Subjective:  [name] is a 84 y.o. male referred today for evaluation of stage IV sacral ulcer. He is accompanied by his two daughters Helene Kelp and Fraser Din today whom help provide care for their father.  History was provided mostly by his daughters.   Ulcer started after sustaining a fall 2 months ago now. Wound started as a "big dark bruise" and eventually evolved to full thickness wound and opened to a stage IV pressure wound while he was in a facility. He is now home with his daughter. He was referred to the [facility] and has been working with Margarita Grizzle since April of this year. Has been on and off a few antibiotics (doxycycline / cefdinir). Superficial wound culture was recovered and showed sensitive pseudomonas. He is here for consideration of treatment of the wound as it is not healing. He has no evidence of osteomyelitis on radiography; he also has borderline diabetes with HgbA1C 6.0%. His daughters tell me he is eating pretty well but lately he is starting to get shaky and has trouble walking and they are worried this is due to infection. He has gained weight recently (170 lbs today but they tell me he was 142 lbs or so a few months back) and has had some relatively new onset leg swelling. At home Daylen spends most of the time in a chair sitting on a 'donut pillow.' He just got a hospital bed and low air loss mattress delivered to their home which they are happy about. Plan is also to initiate VAC therapy to this area to help with healing. His daughter Fraser Din is the one that usually dresses his wound. They are using Vasche to clean the wound and performing wet-to-dry dressings as of now with a sponge dressing. There is always some kind of  yellow/green thin drainage noted on the dressing when they remove it. No odor is present. Some surrounding redness to the wound bed. He does not have any fevers/chills/night sweats. He is on quite a few cardiac medications one of which recently was Amiodarone.   Review of Systems  All other systems reviewed and are negative.   Past Medical History:  Diagnosis Date  . Arthritis    "hips, back" (06/17/2015)  . Asthma   . Chronic chest pain    . Chronic lower back pain   . Coronary artery disease    a. s/p BMS to RCA in 2002; b. s/p cutting balloon POBA ;   c. cath 6/12: oDx 80% (treated with repeat cutting balloon POBA), mLAD 50% with 30-40% at Dx, CFX 30%, pRCA 25% with patent stents;  d.  Lex MV 4/14:  Low Risk - EF 61%, inf scar with peri-infarct ischemia  . Dyspnea    chronic  . GERD (gastroesophageal reflux disease)    h/o esophageal spasm  . Headache   . History of blood transfusion    "related to OR"  . History of hiatal hernia   . Hyperlipidemia   . Hypertension   . Melanoma of lower back ([place]) late 1990's  . Memory loss   . Myocardial infarction ([place]) 2001   x 1, confirned 1 possible    Outpatient Medications Prior to Visit  Medication Sig Dispense Refill  . acetaminophen (TYLENOL) 325 MG tablet Take 2 tablets (650 mg total) by mouth every 4 (four) hours as needed for headache or mild pain.    Marland Kitchen amiodarone (PACERONE) 200 MG tablet Take 1 tablet (200 mg total) by mouth 2 (two) times daily. 60 tablet 0  . aspirin EC 81 MG tablet Take 1 tablet (81 mg total) by mouth daily.    Marland Kitchen atorvastatin (LIPITOR) 80 MG tablet Take 1 tablet (80 mg total) by mouth daily. 90 tablet 3  . cyclobenzaprine (FLEXERIL) 5 MG tablet Take 7.5 mg by mouth 3 (three) times daily as needed for muscle spasms.     Marland Kitchen doxazosin (CARDURA) 1 MG tablet TAKE 1 TABLET BY MOUTH ONCE DAILY. 15 tablet 0  . feeding supplement, ENSURE ENLIVE, (ENSURE ENLIVE) LIQD Take 237 mLs by mouth 2 (two) times daily between meals. 237 mL 12  . feeding supplement, ENSURE ENLIVE, (ENSURE ENLIVE) LIQD Take 237 mLs by mouth 2 (two) times daily between meals. 237 mL 12  . HYDROcodone-acetaminophen (NORCO) 7.5-325 MG tablet Take 1 tablet by mouth every 4 (four) hours as needed for moderate pain. FOR PAIN 20 tablet 0  . pantoprazole (PROTONIX) 40 MG tablet TAKE 1 TABLET BY MOUTH TWICE DAILY 30 tablet 0  . vitamin B-12 1000 MCG tablet Take 1 tablet (1,000 mcg total) by mouth  daily. 30 tablet 0  . tamsulosin (FLOMAX) 0.4 MG CAPS capsule Take 0.4 mg by mouth daily.      No facility-administered medications prior to visit.      Allergies  Allergen Reactions  . Budesonide-Formoterol Fumarate Swelling    Mouth and tongue swelling.  . Iohexol Shortness Of Breath and Itching    Pt was given 100cc of Omnipaque 300 followed by itching/ dyspnea.  . Simvastatin Other (See Comments)    REACTION: unknown  . Demerol [Meperidine] Other (See Comments)    REACTION: Hallucinations  . Ivp Dye [Iodinated Diagnostic Agents] Other (See Comments)    Iodine pt had a reaction when he had a CT DONE  . Meperidine  Hcl Other (See Comments)    REACTION: Hallucinations  . Tape Other (See Comments)    Tears skin off, Please use "paper" tape  . Naproxen Swelling  . Pregabalin Rash  . Sulfonamide Derivatives Rash    Social History   Tobacco Use  . Smoking status: Never Smoker  . Smokeless tobacco: Never Used  Substance Use Topics  . Alcohol use: No  . Drug use: No    Family History  Problem Relation Age of Onset  . Diabetes Father   . Heart disease Father   . Asthma Father   . Heart disease Mother        CABG hx age 18  . Colon cancer Son        hx   . Colitis Son        hx  . Crohn's disease Son   . Prostate cancer Paternal Grandfather     Objective:   Vitals:   03/22/18 1341  BP: (!) 165/71  Pulse: 73  Temp: 98.4 F (36.9 C)  TempSrc: Oral  Weight: 170 lb (77.1 kg)   Body mass index is 25.1 kg/m.  Physical Exam  Constitutional: He is oriented to person, place, and time. He appears well-developed and well-nourished.  Seated on the table. Shaky. Daughters present.   HENT:  Mouth/Throat: Oropharynx is clear and moist and mucous membranes are normal. Normal dentition. No dental abscesses.  Cardiovascular: Normal rate, regular rhythm and normal heart sounds.  Pulmonary/Chest: Effort normal and breath sounds normal.  Abdominal: Soft. He exhibits no  distension. There is no tenderness.  Musculoskeletal: He exhibits edema (3-4+ edema to knee and 2-3+ sacral edema).  Lymphadenopathy:    He has no cervical adenopathy.  Neurological: He is alert and oriented to person, place, and time.  Skin: Skin is warm and dry. No rash noted.  Sacral wound pictured below with description.   Psychiatric: He has a normal mood and affect. Judgment normal.  In good spirits today and engaged in care discussion.     Sacral wound measuring ~3.75 x 3.25 x 2 cm deep at the deepest area of undermining at the 2-3 o'clock position. Diffuse mild erythema to the left of the wound. 2-3+ edema noted to sacral region. Lightly moist wound bed. No drainage in the wound; small yellow drainage on dressing. Small bleeding with removal of gauze. < 10% white/yellow slough. Wound margins demonstrate what appears to be epibole.     Lab Results: Lab Results  Component Value Date   WBC 9.8 03/22/2018   HGB 9.5 (L) 03/22/2018   HCT 28.6 (L) 03/22/2018   MCV 87.2 03/22/2018   PLT 377 03/22/2018    Lab Results  Component Value Date   CREATININE 0.89 03/22/2018   BUN 22 03/22/2018   NA 138 03/22/2018   K 4.8 03/22/2018   CL 101 03/22/2018   CO2 29 03/22/2018    Lab Results  Component Value Date   ALT 12 03/22/2018   AST 14 03/22/2018   ALKPHOS 76 01/20/2018   BILITOT 0.4 03/22/2018     Assessment & Plan:   Problem List Items Addressed This Visit      Musculoskeletal and Integument   Sacral decubitus ulcer, stage IV, healing     84 y.o. male with chronic healing stage 4 sacral wound. Sensitive pseudomonas obtained on superficial swab. Currently he is maintained on Cefdinir. On exam today wound bed demonstrates pink granulation tissue with very minimal pale yellow slough (<10%). No drainage  in wound bed aside from some yellow thin odorless drainage on gauze. Some bleeding noted with removal of dried packing. No bone is probed from the best I can tell. He does have  some diffuse erythema along the left aspect of the wound extending to left gluteus but it is not warm in comparison to surrounding skin. He does have a fair amount of sacral edema present that I think is more the contributor of the erythema in addition to unrelieved pressure. He has not yet had been using a low air-loss mattress and spends most of his time sitting with a "donut" like pillow per his daughters' account. Wound edges appear to be rolled under to where I would recommend consideration of debridement as I fear the wound will never heal properly even with NPWT or antibiotics.   I suspect that his wound is colonized with pseudomonas but not currently causing infection. Cefdinir has no activity against pseudomonas and I would feel comfortable with stopping this to observe progress off antibiotics.  I tried to call and discuss with Margarita Grizzle but he is not in - will fax note and try to call and discuss next week.   We did discuss what treatment would look like with PICC line if we do need to treat this wound at some point - I would not feel comfortable using fluoroquinolone in this elderly patient on multiple QTc prolonging medications (amiodarone recently added). Would likely prefer Ceftaz vs Cefepime - whichever would be easier to dose for OPAT. I encouraged continued good nutrition and would love to see him use the low-airloss mattress at home. I am not sure if the donut pillow is helping him or hurting him.   I told Lang and his daughters I would call them to discuss further next week but at this point do not feel antibiotics are needed presently. Will discuss next steps for follow up at that phone call.         Other   Medication monitoring encounter    In consideration of starting IV antibiotics I will check baseline kidney, liver and electrolyte function as I do not have anything recent to dose. He has had some significant increase in swelling over the last few months and concerned he may have a  change.        Other Visit Diagnoses    Sacral wound, initial encounter    -  Primary   Relevant Orders   CBC with Differential/Platelet (Completed)   COMPLETE METABOLIC PANEL WITH GFR (Completed)   C-reactive protein (Completed)   Sedimentation rate (Completed)     Janene Madeira, MSN, NP-C [facility] Pager: [phone] Office: [phone]  03/23/18  4:15 PM

## 2018-03-22 NOTE — Assessment & Plan Note (Signed)
In consideration of starting IV antibiotics I will check baseline kidney, liver and electrolyte function as I do not have anything recent to dose. He has had some significant increase in swelling over the last few months and concerned he may have a change.

## 2018-03-23 ENCOUNTER — Encounter: Payer: Self-pay | Admitting: Infectious Diseases

## 2018-03-23 LAB — COMPLETE METABOLIC PANEL WITH GFR
AG RATIO: 1.2 (calc) (ref 1.0–2.5)
ALBUMIN MSPROF: 3.4 g/dL — AB (ref 3.6–5.1)
ALT: 12 U/L (ref 9–46)
AST: 14 U/L (ref 10–35)
Alkaline phosphatase (APISO): 94 U/L (ref 40–115)
BUN: 22 mg/dL (ref 7–25)
CHLORIDE: 101 mmol/L (ref 98–110)
CO2: 29 mmol/L (ref 20–32)
Calcium: 9 mg/dL (ref 8.6–10.3)
Creat: 0.89 mg/dL (ref 0.70–1.11)
GFR, EST AFRICAN AMERICAN: 91 mL/min/{1.73_m2} (ref >=60)
GFR, Est Non African American: 79 mL/min/{1.73_m2} (ref >=60)
GLOBULIN: 2.8 g/dL (ref 1.9–3.7)
GLUCOSE: 117 mg/dL — AB (ref 65–99)
Potassium: 4.8 mmol/L (ref 3.5–5.3)
SODIUM: 138 mmol/L (ref 135–146)
TOTAL PROTEIN: 6.2 g/dL (ref 6.1–8.1)
Total Bilirubin: 0.4 mg/dL (ref 0.2–1.2)

## 2018-03-23 LAB — CBC WITH DIFFERENTIAL/PLATELET
BASOS PCT: 0.7 %
Basophils Absolute: 69 cells/uL (ref 0–200)
EOS ABS: 314 {cells}/uL (ref 15–500)
Eosinophils Relative: 3.2 %
HEMATOCRIT: 28.6 % — AB (ref 38.5–50.0)
HEMOGLOBIN: 9.5 g/dL — AB (ref 13.2–17.1)
LYMPHS ABS: 1225 {cells}/uL (ref 850–3900)
MCH: 29 pg (ref 27.0–33.0)
MCHC: 33.2 g/dL (ref 32.0–36.0)
MCV: 87.2 fL (ref 80.0–100.0)
MONOS PCT: 13.6 %
MPV: 9.8 fL (ref 7.5–12.5)
Neutro Abs: 6860 cells/uL (ref 1500–7800)
Neutrophils Relative %: 70 %
Platelets: 377 10*3/uL (ref 140–400)
RBC: 3.28 10*6/uL — ABNORMAL LOW (ref 4.20–5.80)
RDW: 14 % (ref 11.0–15.0)
Total Lymphocyte: 12.5 %
WBC: 9.8 10*3/uL (ref 3.8–10.8)
WBCMIX: 1333 {cells}/uL — AB (ref 200–950)

## 2018-03-23 LAB — SEDIMENTATION RATE: Sed Rate: 33 mm/h — ABNORMAL HIGH (ref 0–20)

## 2018-03-23 LAB — C-REACTIVE PROTEIN: CRP: 62.1 mg/L — ABNORMAL HIGH (ref <8.0)

## 2018-03-23 NOTE — Progress Notes (Signed)
JSEAN, TAUSSIG ([id number]) Visit Report for 03/20/2018 Chief Complaint Document Details Patient Name: [name], [name]. Date of Service: 03/20/2018 10:00 AM Medical Record Number: [id number] Patient Account Number: [id number] Date of Birth/Sex: 1931/06/15 (84 y.o. M) Treating RN: Ahmed Prima Primary Care Provider: Burman Freestone Other Clinician: Referring Provider: Burman Freestone Treating Provider/Extender: [name], HOYT Weeks in Treatment: 6 Information Obtained from: Patient Chief Complaint Sacral pressure ulcer and left shoulder rash Electronic Signature(s) Signed: 03/21/2018 8:38:06 AM By: Worthy Keeler PA-C Entered By: Worthy Keeler on 03/20/2018 10:45:04 [name] ([id number]) -------------------------------------------------------------------------------- HPI Details Patient Name: [name] Date of Service: 03/20/2018 10:00 AM Medical Record Number: [id number] Patient Account Number: [id number] Date of Birth/Sex: Mar 10, 1931 (84 y.o. M) Treating RN: Ahmed Prima Primary Care Provider: Burman Freestone Other Clinician: Referring Provider: Burman Freestone Treating Provider/Extender: [name], HOYT Weeks in Treatment: 6 History of Present Illness HPI Description: 02/06/18 on evaluation today patient presents for initial evaluation and our clinic concerning an issue which began roughly 3 weeks ago when the patient fell in his home on the floor in his kitchen and laid him down this detergent for roughly 3 days. He had a pressure injury to the left shoulder. This unfortunately has caused him a lot of discomfort although it finally seems to be doing better if anything is really having a lot of itching right now. This appears potentially be a contact dermatitis issue. He also has a significant pressure injury to the sacrum at this time as well which is also showing fascia exposure right over the bone but no evidence of bone exposure at this point which is  good news. They have been using Santyl as well as Saline soaked gauze at this point in time. There does appear to be a lot of necrotic slough in the base of the wound. He does have a history of incontinence, myocardial infarction, and hypertension. He also is "borderline diabetic" hemoglobin A1c of 6.0. Currently he has some discomfort in the pressure site at the sacrum but fortunately nothing too significant this did require sharp debridement today. 02/13/18 on evaluation today patient appears to be doing much better in regard to his sacral wound. He has been tolerating the dressing changes without complication with the Vashe. Fortunately there is no evidence of infection and though there is some Slough on the surface of the wound bed he has excellent granulation noted. Overall I'm pleased with how things have progressed in that regard. A glance at his shoulder as well and the rash seems to be someone improving in my pinion at this site as well. Overall I am pleased with what we're seeing and so is the family. 02/20/18 on evaluation today patient appears to be doing a little worse in regard to the sacral wound only in the fact that there is redness surrounding it has me somewhat concerned for infection. The drainage has also apparently been a little bit off color compared to normal according to family they did keep the dressing today that was removed to show me and I agree this seems to be a little bit different compared to what we have been seeing. Coupled with the redness I'm concerned he may be developing some cellulitis surrounding the wound bed. 02/27/18 on evaluation today patient presents for follow-up concerning his sacral ulcer. We have received the results back from his wound culture which shows unfortunately that the doxycycline will not be of benefit for him I am going to need to initiate  treatment with something else in order to treat the pseudomonas. Otherwise he does not seem to be  having any significant pain although his daughter states there are sometimes when he states having pain. We continue to use the Vashe currently. 03/06/18 on evaluation today patient's sacral wound appears to be doing better in my opinion. He has been tolerating the dressing changes without complication. With that being said the silver nitrate has helped with the prominent area of hyper granulation at the 6 o'clock location we will likely need to repeat this again today. Nonetheless overall I am pleased with how things have improved over the last week. The erythema surrounding the wound seems to be greatly improved. 03/13/18 on evaluation today patient's wound actually does not appear to be terribly infected although she does continue to have erythema surrounding the wound bed especially on the left border. I'm still somewhat concerned about the fact that the oral antibiotics alone may not be completely treating his infection. I previously discussed may need to go for IV antibiotic therapy I'm concerned that may be the case. We will need to make a referral today for infectious disease. 03/20/18 on evaluation today the patient sacral wound actually appears to be doing fairly well in regard to granulation although he continues unfortunately to have it your theme is surrounding the periwound region. There's also some increased swelling at the 6 o'clock location which also has me somewhat concerned. With that being said he does have some discomfort but nothing too significant at this point. He still has not heard from infectious disease his daughter and wife are both present during the office visit today they're going to check back with this again. We did get the information for them to call them today. Electronic Signature(s) Signed: 03/21/2018 8:38:06 AM By: Molli Barrows ([id number]) Entered By: Worthy Keeler on 03/21/2018 01:56:08 [name]  ([id number]) -------------------------------------------------------------------------------- Physical Exam Details Patient Name: [name] Date of Service: 03/20/2018 10:00 AM Medical Record Number: [id number] Patient Account Number: [id number] Date of Birth/Sex: [birth year] (84 y.o. M) Treating RN: Ahmed Prima Primary Care Provider: Burman Freestone Other Clinician: Referring Provider: Burman Freestone Treating Provider/Extender: STONE III, HOYT Weeks in Treatment: 6 Constitutional Well-nourished and well-hydrated in no acute distress. Respiratory normal breathing without difficulty. clear to auscultation bilaterally. Cardiovascular regular rate and rhythm with normal S1, S2. Psychiatric this patient is able to make decisions and demonstrates good insight into disease process. Alert and Oriented x 3. pleasant and cooperative. Notes At this time I discussed with the patient that I do believe that there is no sharp debridement required this was discussed with family as well. Subsequently I suggested that still the best thing to do is likely going to be continuing with the Current wound care measures I do not really want to initiate the Wound VAC until we get things under control from infection standpoint. They completely understand. Subsequently they are gonna call infectious disease today if there's any trouble or lengthy period of time getting him to let me know and I'll see what I can do to help out as well. Otherwise again no sharp debridement nor procedures were necessary today the wound otherwise appears to be doing well. Electronic Signature(s) Signed: 03/21/2018 8:38:06 AM By: Worthy Keeler PA-C Entered By: Worthy Keeler on 03/21/2018 01:56:58 [name] ([id number]) -------------------------------------------------------------------------------- Physician Orders Details Patient Name: HARSHA, YUSKO Date of Service: 03/20/2018 10:00 AM Medical Record Number:  [id number] Patient  Account Number: [id number] Date of Birth/Sex: Aug 09, 1931 (84 y.o. M) Treating RN: Ahmed Prima Primary Care Provider: Burman Freestone Other Clinician: Referring Provider: Burman Freestone Treating Provider/Extender: [name], HOYT Weeks in Treatment: 6 Verbal / Phone Orders: Yes Clinician: Carolyne Fiscal, Debi Read Back and Verified: Yes Diagnosis Coding ICD-10 Coding Code Description L89.154 Pressure ulcer of sacral region, stage 4 L24.0 Irritant contact dermatitis due to detergents Wound Cleansing Wound #1 Sacrum o Clean wound with Normal Saline. Anesthetic (add to Medication List) Wound #1 Sacrum o Topical Lidocaine 4% cream applied to wound bed prior to debridement (In Clinic Only). Skin Barriers/Peri-Wound Care Wound #1 Sacrum o Barrier cream Primary Wound Dressing Wound #1 Sacrum o Other: - 3 inch conform soaked in Vashe or Dakins and packed into wound Secondary Dressing Wound #1 Sacrum o ABD pad o Dry Gauze o Other - Drawtex or equal Dressing Change Frequency Wound #1 Sacrum o Change dressing every day. o Change Dressing Monday, Wednesday, Friday - NPWT when applied Follow-up Appointments Wound #1 Sacrum o Return Appointment in 1 week. Off-Loading Wound #1 Sacrum o Mattress - air mattress wound care will order o Turn and reposition every 2 hours o Other: - hospital bed wound care will order LATONYA, KNIGHT ([id number]) Additional Orders / Instructions Wound #1 Sacrum o Increase protein intake. Home Health Wound #1 [place] Visits - [facility]- Please order NPWT. If wound center needs to order, please call Kim @ [facility] may visit PRN to address patientos wound care needs. o FACE TO FACE ENCOUNTER: MEDICARE and MEDICAID PATIENTS: I certify that this patient is under my care and that I had a face-to-face encounter that meets the physician face-to-face encounter  requirements with this patient on this date. The encounter with the patient was in whole or in part for the following MEDICAL CONDITION: (primary reason for [place]) MEDICAL NECESSITY: I certify, that based on my findings, NURSING services are a medically necessary home health service. HOME BOUND STATUS: I certify that my clinical findings support that this patient is homebound (i.e., Due to illness or injury, pt requires aid of supportive devices such as crutches, cane, wheelchairs, walkers, the use of special transportation or the assistance of another person to leave their place of residence. There is a normal inability to leave the home and doing so requires considerable and taxing effort. Other absences are for medical reasons / religious services and are infrequent or of short duration when for other reasons). o If current dressing causes regression in wound condition, may D/C ordered dressing product/s and apply Normal Saline Moist Dressing daily until next [place] / Other MD appointment. [place] of regression in wound condition at [phone]. o Please direct any NON-WOUND related issues/requests for orders to patient's Primary Care Physician Negative Pressure Wound Therapy Wound #1 Sacrum o Wound VAC settings at 125/130 mmHg continuous pressure. Use BLACK/GREEN foam to wound cavity. Use WHITE foam to fill any tunnel/s and/or undermining. Change VAC dressing 3 X WEEK. Change canister as indicated when full. Nurse may titrate settings and frequency of dressing changes as clinically indicated. o Home Health Nurse may d/c VAC for s/s of increased infection, significant wound regression, or uncontrolled drainage. [place] at [phone]. Medications-please add to medication list. Wound #1 Sacrum o Other: - TAC on back and shoulder Patient Medications Allergies: Lyrica, gabapentin, Sulfa (Sulfonamide Antibiotics),  contrast dye, budesonide, iohexol, simvastatin, Demerol, meperidine, adhesive tape, naproxen, pregabalin Notifications  Medication Indication Start End lidocaine DOSE 1 - topical 4 % cream - 1 cream topical Electronic Signature(s) Signed: 03/20/2018 5:20:44 PM By: Alric Quan Signed: 03/21/2018 8:38:06 AM By: Worthy Keeler PA-C Entered By: Alric Quan on 03/20/2018 11:18:24 MEREDITH, MELLS ([id number]) -------------------------------------------------------------------------------- Prescription 03/20/2018 Patient Name: [name] Provider: Worthy Keeler PA-C Date of Birth: [birth year] NPI#: [id number] Sex: M DEA#: [id number] Phone #: [phone] License #: Patient Address: [facility] [place], [place] [address] [address], [place], [place] [address] [phone] Allergies Lyrica gabapentin Sulfa (Sulfonamide Antibiotics) contrast dye budesonide iohexol simvastatin Demerol meperidine adhesive tape naproxen pregabalin Medication Medication: Route: Strength: Form: lidocaine topical 4% cream Class: TOPICAL LOCAL ANESTHETICS Indication: BEREL, NAJJAR ([id number]) Dose: Frequency / Time: 1 1 cream topical Number of Refills: Number of Units: 0 Generic Substitution: Start Date: End Date: Administered at [place]: Yes Time Administered: Time Discontinued: Note to Pharmacy: Signature(s): Date(s): Electronic Signature(s) Signed: 03/20/2018 5:20:44 PM By: Alric Quan Signed: 03/21/2018 8:38:06 AM By: Worthy Keeler PA-C Entered By: Alric Quan on 03/20/2018 11:18:25 [name] ([id number]) --------------------------------------------------------------------------------  Problem List Details Patient Name: JAVONNI, MACKE. Date of Service: 03/20/2018 10:00 AM Medical Record Number: [id number] Patient Account Number:  [id number] Date of Birth/Sex: 11/13/1930 (84 y.o. M) Treating RN: Ahmed Prima Primary Care Provider: Burman Freestone Other Clinician: Referring Provider: Burman Freestone Treating Provider/Extender: [name], HOYT Weeks in Treatment: 6 Active Problems ICD-10 Impacting Encounter Code Description Active Date Wound Healing Diagnosis L89.154 Pressure ulcer of sacral region, stage 4 02/06/2018 No Yes L24.0 Irritant contact dermatitis due to detergents 02/06/2018 No Yes Inactive Problems Resolved Problems Electronic Signature(s) Signed: 03/21/2018 8:38:06 AM By: Worthy Keeler PA-C Entered By: Worthy Keeler on 03/20/2018 10:44:55 [name] ([id number]) -------------------------------------------------------------------------------- Progress Note Details Patient Name: [name] Date of Service: 03/20/2018 10:00 AM Medical Record Number: [id number] Patient Account Number: [id number] Date of Birth/Sex: [birth year] (84 y.o. M) Treating RN: Ahmed Prima Primary Care Provider: Burman Freestone Other Clinician: Referring Provider: Burman Freestone Treating Provider/Extender: [name], HOYT Weeks in Treatment: 6 Subjective Chief Complaint Information obtained from Patient Sacral pressure ulcer and left shoulder rash History of Present Illness (HPI) 02/06/18 on evaluation today patient presents for initial evaluation and our clinic concerning an issue which began roughly 3 weeks ago when the patient fell in his home on the floor in his kitchen and laid him down this detergent for roughly 3 days. He had a pressure injury to the left shoulder. This unfortunately has caused him a lot of discomfort although it finally seems to be doing better if anything is really having a lot of itching right now. This appears potentially be a contact dermatitis issue. He also has a significant pressure injury to the sacrum at this time as well which is also showing fascia exposure right over  the bone but no evidence of bone exposure at this point which is good news. They have been using Santyl as well as Saline soaked gauze at this point in time. There does appear to be a lot of necrotic slough in the base of the wound. He does have a history of incontinence, myocardial infarction, and hypertension. He also is "borderline diabetic" hemoglobin A1c of 6.0. Currently he has some discomfort in the pressure site at the sacrum but fortunately nothing too significant this did require sharp debridement today. 02/13/18 on evaluation today patient appears to  be doing much better in regard to his sacral wound. He has been tolerating the dressing changes without complication with the Vashe. Fortunately there is no evidence of infection and though there is some Slough on the surface of the wound bed he has excellent granulation noted. Overall I'm pleased with how things have progressed in that regard. A glance at his shoulder as well and the rash seems to be someone improving in my pinion at this site as well. Overall I am pleased with what we're seeing and so is the family. 02/20/18 on evaluation today patient appears to be doing a little worse in regard to the sacral wound only in the fact that there is redness surrounding it has me somewhat concerned for infection. The drainage has also apparently been a little bit off color compared to normal according to family they did keep the dressing today that was removed to show me and I agree this seems to be a little bit different compared to what we have been seeing. Coupled with the redness I'm concerned he may be developing some cellulitis surrounding the wound bed. 02/27/18 on evaluation today patient presents for follow-up concerning his sacral ulcer. We have received the results back from his wound culture which shows unfortunately that the doxycycline will not be of benefit for him I am going to need to initiate treatment with something else in  order to treat the pseudomonas. Otherwise he does not seem to be having any significant pain although his daughter states there are sometimes when he states having pain. We continue to use the Vashe currently. 03/06/18 on evaluation today patient's sacral wound appears to be doing better in my opinion. He has been tolerating the dressing changes without complication. With that being said the silver nitrate has helped with the prominent area of hyper granulation at the 6 o'clock location we will likely need to repeat this again today. Nonetheless overall I am pleased with how things have improved over the last week. The erythema surrounding the wound seems to be greatly improved. 03/13/18 on evaluation today patient's wound actually does not appear to be terribly infected although she does continue to have erythema surrounding the wound bed especially on the left border. I'm still somewhat concerned about the fact that the oral antibiotics alone may not be completely treating his infection. I previously discussed may need to go for IV antibiotic therapy I'm concerned that may be the case. We will need to make a referral today for infectious disease. 03/20/18 on evaluation today the patient sacral wound actually appears to be doing fairly well in regard to granulation although he continues unfortunately to have it your theme is surrounding the periwound region. There's also some increased swelling at the 6 o'clock location which also has me somewhat concerned. With that being said he does have some discomfort but KEYMARI, SATO. ([id number]) nothing too significant at this point. He still has not heard from infectious disease his daughter and wife are both present during the office visit today they're going to check back with this again. We did get the information for them to call them today. Patient History Information obtained from Patient. Family History Cancer - Paternal Grandparents, Diabetes -  Father, Heart Disease - Mother,Father, Stroke - Father, No family history of Hypertension, Kidney Disease, Lung Disease, Seizures, Thyroid Problems, Tuberculosis. Social History Never smoker, Marital Status - Widowed, Alcohol Use - Never, Drug Use - No History, Caffeine Use - Daily. Medical History Hospitalization/Surgery History -  01/20/2018, [facility], Fall. 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 Respiratory The patient has no complaints or symptoms. Cardiovascular The patient has no complaints or symptoms. Psychiatric The patient has no complaints or symptoms. Objective Constitutional Well-nourished and well-hydrated in no acute distress. Vitals Time Taken: 10:30 AM, Height: 69 in, Weight: 155 lbs, BMI: 22.9, Temperature: 98.4 F, Pulse: 69 bpm, Respiratory Rate: 16 breaths/min, Blood Pressure: 157/80 mmHg. Respiratory normal breathing without difficulty. clear to auscultation bilaterally. Cardiovascular regular rate and rhythm with normal S1, S2. Psychiatric this patient is able to make decisions and demonstrates good insight into disease process. Alert and Oriented x 3. pleasant KHRIZ, LIDDY ([id number]) and cooperative. General Notes: At this time I discussed with the patient that I do believe that there is no sharp debridement required this was discussed with family as well. Subsequently I suggested that still the best thing to do is likely going to be continuing with the Current wound care measures I do not really want to initiate the Wound VAC until we get things under control from infection standpoint. They completely understand. Subsequently they are gonna call infectious disease today if there's any trouble or lengthy period of time getting him to let me know and I'll see what I can do to help out as well. Otherwise  again no sharp debridement nor procedures were necessary today the wound otherwise appears to be doing well. Integumentary (Hair, Skin) Wound #1 status is Open. Original cause of wound was Pressure Injury. The wound is located on the Sacrum. The wound measures 4cm length x 3.5cm width x 2cm depth; 10.996cm^2 area and 21.991cm^3 volume. There is Fat Layer (Subcutaneous Tissue) Exposed exposed. There is no tunneling noted, however, there is undermining starting at 1:00 and ending at 12:00 with a maximum distance of 1.3cm. There is a large amount of purulent drainage noted. The wound margin is distinct with the outline attached to the wound base. There is large (67-100%) red, pink, hyper - granulation within the wound bed. There is a small (1-33%) amount of necrotic tissue within the wound bed including Adherent Slough. The periwound skin appearance exhibited: Excoriation, Erythema. The periwound skin appearance did not exhibit: Callus, Crepitus, Induration, Rash, Scarring, Dry/Scaly, Maceration, Atrophie Blanche, Cyanosis, Ecchymosis, Hemosiderin Staining, Mottled, Pallor, Rubor. The surrounding wound skin color is noted with erythema which is circumferential. Periwound temperature was noted as No Abnormality. The periwound has tenderness on palpation. Assessment Active Problems ICD-10 Pressure ulcer of sacral region, stage 4 Irritant contact dermatitis due to detergents Plan Wound Cleansing: Wound #1 Sacrum: Clean wound with Normal Saline. Anesthetic (add to Medication List): Wound #1 Sacrum: Topical Lidocaine 4% cream applied to wound bed prior to debridement (In Clinic Only). Skin Barriers/Peri-Wound Care: Wound #1 Sacrum: Barrier cream Primary Wound Dressing: Wound #1 Sacrum: Other: - 3 inch conform soaked in Vashe or Dakins and packed into wound Secondary Dressing: Wound #1 Sacrum: ABD pad Dry Gauze Other - Drawtex or equal Dressing Change Frequency: PERKINS, MOLINA  ([id number]) Wound #1 Sacrum: Change dressing every day. Change Dressing Monday, Wednesday, Friday - NPWT when applied Follow-up Appointments: Wound #1 Sacrum: Return Appointment in 1 week. Off-Loading: Wound #1 Sacrum: Mattress - air mattress wound care will order Turn and reposition every 2 hours Other: - hospital bed wound care will order Additional Orders / Instructions: Wound #1 Sacrum: Increase protein intake. Home Health: Wound #1  Sacrum: [place] Visits - [facility]- Please order NPWT. If wound center needs to order, please call Kim @ [facility] Nurse may visit PRN to address patient s wound care needs. FACE TO FACE ENCOUNTER: MEDICARE and MEDICAID PATIENTS: I certify that this patient is under my care and that I had a face-to-face encounter that meets the physician face-to-face encounter requirements with this patient on this date. The encounter with the patient was in whole or in part for the following MEDICAL CONDITION: (primary reason for [place]) MEDICAL NECESSITY: I certify, that based on my findings, NURSING services are a medically necessary home health service. HOME BOUND STATUS: I certify that my clinical findings support that this patient is homebound (i.e., Due to illness or injury, pt requires aid of supportive devices such as crutches, cane, wheelchairs, walkers, the use of special transportation or the assistance of another person to leave their place of residence. There is a normal inability to leave the home and doing so requires considerable and taxing effort. Other absences are for medical reasons / religious services and are infrequent or of short duration when for other reasons). If current dressing causes regression in wound condition, may D/C ordered dressing product/s and apply Normal Saline Moist Dressing daily until next [place] / Other MD appointment. [place] of regression in wound  condition at [phone]. Please direct any NON-WOUND related issues/requests for orders to patient's Primary Care Physician Negative Pressure Wound Therapy: Wound #1 Sacrum: Wound VAC settings at 125/130 mmHg continuous pressure. Use BLACK/GREEN foam to wound cavity. Use WHITE foam to fill any tunnel/s and/or undermining. Change VAC dressing 3 X WEEK. Change canister as indicated when full. Nurse may titrate settings and frequency of dressing changes as clinically indicated. Home Health Nurse may d/c VAC for s/s of increased infection, significant wound regression, or uncontrolled drainage. [place] at [phone]. Medications-please add to medication list.: Wound #1 Sacrum: Other: - TAC on back and shoulder The following medication(s) was prescribed: lidocaine topical 4 % cream 1 1 cream topical was prescribed at facility I am going to suggest at this point that we continue with the Current wound care measures until such a time as the patient sees infectious disease and antibiotic therapy more specifically is initiated. The patient's family is in agreement the plan. We will see were things stand in one weeks time and hopefully in the interim will be able to get her hands on someone with infectious disease to get the patient for further evaluation and treatment. Please see above for specific wound care orders. We will see patient for re-evaluation in 1 week(s) here in the clinic. If anything worsens or changes patient will contact our office for additional recommendations. Electronic Signature(s) Signed: 03/21/2018 8:38:06 AM By: Molli Barrows ([id number]) Entered By: Worthy Keeler on 03/21/2018 01:58:04 [name] ([id number]) -------------------------------------------------------------------------------- ROS/PFSH Details Patient Name: ISHAQ, MAFFEI Date of Service: 03/20/2018 10:00 AM Medical Record Number: [id number] Patient  Account Number: [id number] Date of Birth/Sex: [birth year] (84 y.o. M) Treating RN: Ahmed Prima Primary Care Provider: Burman Freestone Other Clinician: Referring Provider: Burman Freestone Treating Provider/Extender: [name], HOYT Weeks in Treatment: 6 Information Obtained From Patient Wound History Do you currently have one or more open woundso Yes How many open wounds do you currently haveo 2 Approximately how long have you had your woundso 3 weeks Has your wound(s) ever healed and then re-openedo Yes Have  you had any lab work done in the past montho Yes Have you tested positive for osteomyelitis (bone infection)o No Have you had any tests for circulation on your legso No Constitutional Symptoms (General Health) Complaints and Symptoms: Negative for: Fever; Chills Eyes Medical History: Positive for: Cataracts - bilateral removal Negative for: Glaucoma; Optic Neuritis Ear/Nose/Mouth/Throat Medical History: Negative for: Chronic sinus problems/congestion; Middle ear problems Hematologic/Lymphatic Medical History: Negative for: Anemia; Hemophilia; Human Immunodeficiency Virus; Lymphedema; Sickle Cell Disease Respiratory Complaints and Symptoms: No Complaints or Symptoms Medical History: Positive for: Asthma Negative for: Aspiration; Chronic Obstructive Pulmonary Disease (COPD); Pneumothorax; Sleep Apnea; Tuberculosis Cardiovascular Complaints and Symptoms: No Complaints or Symptoms Medical History: Positive for: Angina; Arrhythmia; Coronary Artery Disease; Hypertension; Myocardial Infarction - 2001 Negative for: Congestive Heart Failure; Deep Vein Thrombosis; Hypotension; Peripheral Arterial Disease; Peripheral VENCIL, BASNETT ([id number]) Venous Disease; Phlebitis; Vasculitis Gastrointestinal Medical History: Negative for: Cirrhosis ; Colitis; Crohnos; Hepatitis A; Hepatitis B; Hepatitis C Endocrine Medical History: Negative for: Type I Diabetes; Type II  Diabetes Past Medical History Notes: Borderline Genitourinary Medical History: Negative for: End Stage Renal Disease Immunological Medical History: Negative for: Lupus Erythematosus; Raynaudos; Scleroderma Integumentary (Skin) Medical History: Negative for: History of Burn; History of pressure wounds Musculoskeletal Medical History: Positive for: Osteoarthritis Negative for: Gout; Rheumatoid Arthritis; Osteomyelitis Neurologic Medical History: Positive for: Dementia; Neuropathy Negative for: Quadriplegia; Paraplegia; Seizure Disorder Oncologic Medical History: Negative for: Received Chemotherapy; Received Radiation Past Medical History Notes: Melanoma on back Psychiatric Complaints and Symptoms: No Complaints or Symptoms Medical History: Negative for: Anorexia/bulimia; Confinement Anxiety HBO Extended History Items Eyes: Cataracts EVRETT, HAKIM ([id number]) Immunizations Pneumococcal Vaccine: Received Pneumococcal Vaccination: Yes Implantable Devices Hospitalization / Surgery History Name of Hospital Purpose of Hospitalization/Surgery Date [facility] 01/20/2018 Family and Social History Cancer: Yes - Paternal Grandparents; Diabetes: Yes - Father; Heart Disease: Yes - Mother,Father; Hypertension: No; Kidney Disease: No; Lung Disease: No; Seizures: No; Stroke: Yes - Father; Thyroid Problems: No; Tuberculosis: No; Never smoker; Marital Status - Widowed; Alcohol Use: Never; Drug Use: No History; Caffeine Use: Daily; Financial Concerns: No; Food, Clothing or Shelter Needs: No; Support System Lacking: No; Transportation Concerns: No; Advanced Directives: Yes (Not Provided); Patient does not want information on Advanced Directives; Do not resuscitate: Yes (Not Provided); Living Will: Yes (Not Provided); Medical Power of Attorney: Yes - Warnie Belair (Not Provided) Physician Affirmation I have reviewed and agree with the above information. Electronic Signature(s) Signed:  03/21/2018 8:38:06 AM By: Worthy Keeler PA-C Signed: 03/22/2018 2:43:37 PM By: Alric Quan Entered By: Worthy Keeler on 03/21/2018 01:56:40 [name] ([id number]) -------------------------------------------------------------------------------- SuperBill Details Patient Name: [name] Date of Service: 03/20/2018 Medical Record Number: [id number] Patient Account Number: [id number] Date of Birth/Sex: 1931/10/03 (84 y.o. M) Treating RN: Ahmed Prima Primary Care Provider: Burman Freestone Other Clinician: Referring Provider: Burman Freestone Treating Provider/Extender: [name], HOYT Weeks in Treatment: 6 Diagnosis Coding ICD-10 Codes Code Description L89.154 Pressure ulcer of sacral region, stage 4 L24.0 Irritant contact dermatitis due to detergents Facility Procedures CPT4 Code: [id number] Description: [facility] VISIT-LEV 3 EST PT Modifier: Quantity: 1 Physician Procedures CPT4 Code: 1950932 Description: 67124 - WC PHYS LEVEL 3 - EST PT ICD-10 Diagnosis Description L89.154 Pressure ulcer of sacral region, stage 4 L24.0 Irritant contact dermatitis due to detergents Modifier: Quantity: 1 Electronic Signature(s) Signed: 03/21/2018 8:38:06 AM By: Worthy Keeler PA-C Entered By: Worthy Keeler on 03/21/2018 01:58:35

## 2018-03-27 ENCOUNTER — Encounter: Payer: Medicare Other | Admitting: Physician Assistant

## 2018-03-27 ENCOUNTER — Other Ambulatory Visit
Admission: RE | Admit: 2018-03-27 | Discharge: 2018-03-27 | Disposition: A | Discharge status: Home / Self Care | Payer: Medicare Other | Source: Ambulatory Visit | Attending: Physician Assistant | Admitting: Physician Assistant

## 2018-03-27 DIAGNOSIS — B999 Unspecified infectious disease: Secondary | ICD-10-CM | POA: Insufficient documentation

## 2018-03-27 DIAGNOSIS — L89154 Pressure ulcer of sacral region, stage 4: Secondary | ICD-10-CM | POA: Diagnosis not present

## 2018-03-30 NOTE — Progress Notes (Signed)
[name], [name] ([id number]) Visit Report for 03/27/2018 Chief Complaint Document Details Patient Name: [name], [name]. Date of Service: 03/27/2018 2:30 PM Medical Record Number: [id number] Patient Account Number: [id number] Date of Birth/Sex: 11-11-1930 (84 y.o. M) Treating RN: Ahmed Prima Primary Care Provider: Burman Freestone Other Clinician: Referring Provider: Burman Freestone Treating Provider/Extender: [name],  Weeks in Treatment: 7 Information Obtained from: Patient Chief Complaint Sacral pressure ulcer and left shoulder rash Electronic Signature(s) Signed: 03/27/2018 5:32:46 PM By: Worthy Keeler PA-C Entered By: Worthy Keeler on 03/27/2018 17:16:22 [name] ([id number]) -------------------------------------------------------------------------------- HPI Details Patient Name: [name] Date of Service: 03/27/2018 2:30 PM Medical Record Number: [id number] Patient Account Number: [id number] Date of Birth/Sex: Apr 14, 1931 (84 y.o. M) Treating RN: Ahmed Prima Primary Care Provider: Burman Freestone Other Clinician: Referring Provider: Burman Freestone Treating Provider/Extender: [name],  Weeks in Treatment: 7 History of Present Illness HPI Description: 02/06/18 on evaluation today patient presents for initial evaluation and our clinic concerning an issue which began roughly 3 weeks ago when the patient fell in his home on the floor in his kitchen and laid him down this detergent for roughly 3 days. He had a pressure injury to the left shoulder. This unfortunately has caused him a lot of discomfort although it finally seems to be doing better if anything is really having a lot of itching right now. This appears potentially be a contact dermatitis issue. He also has a significant pressure injury to the sacrum at this time as well which is also showing fascia exposure right over the bone but no evidence of bone exposure at this point which is  good news. They have been using Santyl as well as Saline soaked gauze at this point in time. There does appear to be a lot of necrotic slough in the base of the wound. He does have a history of incontinence, myocardial infarction, and hypertension. He also is "borderline diabetic" hemoglobin A1c of 6.0. Currently he has some discomfort in the pressure site at the sacrum but fortunately nothing too significant this did require sharp debridement today. 02/13/18 on evaluation today patient appears to be doing much better in regard to his sacral wound. He has been tolerating the dressing changes without complication with the Vashe. Fortunately there is no evidence of infection and though there is some Slough on the surface of the wound bed he has excellent granulation noted. Overall I'm pleased with how things have progressed in that regard. A glance at his shoulder as well and the rash seems to be someone improving in my pinion at this site as well. Overall I am pleased with what we're seeing and so is the family. 02/20/18 on evaluation today patient appears to be doing a little worse in regard to the sacral wound only in the fact that there is redness surrounding it has me somewhat concerned for infection. The drainage has also apparently been a little bit off color compared to normal according to family they did keep the dressing today that was removed to show me and I agree this seems to be a little bit different compared to what we have been seeing. Coupled with the redness I'm concerned he may be developing some cellulitis surrounding the wound bed. 02/27/18 on evaluation today patient presents for follow-up concerning his sacral ulcer. We have received the results back from his wound culture which shows unfortunately that the doxycycline will not be of benefit for him I am going to need to initiate  treatment with something else in order to treat the pseudomonas. Otherwise he does not seem to be  having any significant pain although his daughter states there are sometimes when he states having pain. We continue to use the Vashe currently. 03/06/18 on evaluation today patient's sacral wound appears to be doing better in my opinion. He has been tolerating the dressing changes without complication. With that being said the silver nitrate has helped with the prominent area of hyper granulation at the 6 o'clock location we will likely need to repeat this again today. Nonetheless overall I am pleased with how things have improved over the last week. The erythema surrounding the wound seems to be greatly improved. 03/13/18 on evaluation today patient's wound actually does not appear to be terribly infected although she does continue to have erythema surrounding the wound bed especially on the left border. I'm still somewhat concerned about the fact that the oral antibiotics alone may not be completely treating his infection. I previously discussed may need to go for IV antibiotic therapy I'm concerned that may be the case. We will need to make a referral today for infectious disease. 03/20/18 on evaluation today the patient sacral wound actually appears to be doing fairly well in regard to granulation although he continues unfortunately to have it your theme is surrounding the periwound region. There's also some increased swelling at the 6 o'clock location which also has me somewhat concerned. With that being said he does have some discomfort but nothing too significant at this point. He still has not heard from infectious disease his daughter and wife are both present during the office visit today they're going to check back with this again. We did get the information for them to call them today. 03/27/18 on evaluation today patient is seen concerning his ongoing sacral ulcer. He has been tolerating the dressing changes without complication. With that being said he does present with evidence of bright  green drainage noted on the dressing which again is something that I do often expect to see with a pseudomonas infection. He continues to have your theme is WADDELL, ITEN ([id number]) surrounding the wound bed as well and again I'm not 100% convinced this is just pressure related. I did speak with Colletta [place] who is the nurse practitioner in [place] with infectious disease. I spoke with her actually yesterday concerning this patient. She is not 100% convinced that this is infected. She question whether the wound may just be colonized with Pseudomonas and not actually causing an active infection. I am really not thinking that the edema is associated with pressure alone and again overall I don't feel that your theme and is consistent with a pressure injury either as he's never had any contusions noted like a deep tissue injury on his heel which was new and I did visualize today this was on the left heel. Nonetheless she wanted to give this a little bit more time and thought it would be appropriate to start the Wound VAC at this point. Electronic Signature(s) Signed: 03/27/2018 5:32:46 PM By: Worthy Keeler PA-C Entered By: Worthy Keeler on 03/27/2018 17:17:27 [name] ([id number]) -------------------------------------------------------------------------------- Physical Exam Details Patient Name: [name], [name] Date of Service: 03/27/2018 2:30 PM Medical Record Number: [id number] Patient Account Number: [id number] Date of Birth/Sex: 12/06/1930 (84 y.o. M) Treating RN: Ahmed Prima Primary Care Provider: Burman Freestone Other Clinician: Referring Provider: Burman Freestone Treating Provider/Extender: STONE III,  Weeks in Treatment: 7 Constitutional Well-nourished and well-hydrated in  no acute distress. Respiratory normal breathing without difficulty. clear to auscultation bilaterally. Cardiovascular regular rate and rhythm with normal S1, S2. Psychiatric this  patient is able to make decisions and demonstrates good insight into disease process. Alert and Oriented x 3. pleasant and cooperative. Notes Actually did go ahead and further inspect the wound and again I looked at the dressing that came off of the wound. He continues to have a very bright green drainage which is something that often I see in reaction with pseudomonas in a wound reacting with the Dakin's. Nonetheless I think that one of the things to do at this point may be to actually perform a tissue culture to see if this will indeed show bacteria within the tissues may be giving light further to what may be going on at this point. I therefore did not perform a swamp rather two samples were taken of the granulation tissue for sin into the lab to initiate a tissue culture. He tolerated this without any significant discomfort. Electronic Signature(s) Signed: 03/27/2018 5:32:46 PM By: Worthy Keeler PA-C Entered By: Worthy Keeler on 03/27/2018 17:18:37 [name], [name] ([id number]) -------------------------------------------------------------------------------- Physician Orders Details Patient Name: [name], [name] Date of Service: 03/27/2018 2:30 PM Medical Record Number: [id number] Patient Account Number: [id number] Date of Birth/Sex: June 11, 1931 (84 y.o. M) Treating RN: Ahmed Prima Primary Care Provider: Burman Freestone Other Clinician: Referring Provider: Burman Freestone Treating Provider/Extender: [name],  Weeks in Treatment: 7 Verbal / Phone Orders: Yes Clinician: Pinkerton, Debi Read Back and Verified: Yes Diagnosis Coding Wound Cleansing Wound #1 Sacrum o Clean wound with Normal Saline. Anesthetic (add to Medication List) Wound #1 Sacrum o Topical Lidocaine 4% cream applied to wound bed prior to debridement (In Clinic Only). Skin Barriers/Peri-Wound Care Wound #1 Sacrum o Barrier cream Primary Wound Dressing Wound #1 Sacrum o Silver  Alginate Secondary Dressing Wound #1 Sacrum o ABD pad o Dry Gauze o Other - Drawtex or equal Dressing Change Frequency Wound #1 Sacrum o Change dressing every day. o Change Dressing Monday, Wednesday, Friday - NPWT when applied Follow-up Appointments Wound #1 Sacrum o Return Appointment in 1 week. Off-Loading Wound #1 Sacrum o Mattress - hospital bed o Turn and reposition every 2 hours o Other: - please make sure the pt is floating his heels while lying in bed Additional Orders / Instructions Wound #1 Sacrum o Increase protein intake. [name], [name] ([id number]) Home Health Wound #1 [place] Visits - [facility] Nurse may visit PRN to address patientos wound care needs. o FACE TO FACE ENCOUNTER: MEDICARE and MEDICAID PATIENTS: I certify that this patient is under my care and that I had a face-to-face encounter that meets the physician face-to-face encounter requirements with this patient on this date. The encounter with the patient was in whole or in part for the following MEDICAL CONDITION: (primary reason for [place]) MEDICAL NECESSITY: I certify, that based on my findings, NURSING services are a medically necessary home health service. HOME BOUND STATUS: I certify that my clinical findings support that this patient is homebound (i.e., Due to illness or injury, pt requires aid of supportive devices such as crutches, cane, wheelchairs, walkers, the use of special transportation or the assistance of another person to leave their place of residence. There is a normal inability to leave the home and doing so requires considerable and taxing effort. Other absences are for medical reasons / religious services and are infrequent or of short duration  when for other reasons). o If current dressing causes regression in wound condition, may D/C ordered dressing product/s and apply Normal Saline Moist Dressing daily  until next [place] / Other MD appointment. [place] of regression in wound condition at [phone]. o Please direct any NON-WOUND related issues/requests for orders to patient's Primary Care Physician Negative Pressure Wound Therapy Wound #1 Sacrum o Wound VAC settings at 125/130 mmHg continuous pressure. Use BLACK/GREEN foam to wound cavity. Use WHITE foam to fill any tunnel/s and/or undermining. Change VAC dressing 3 X WEEK. Change canister as indicated when full. Nurse may titrate settings and frequency of dressing changes as clinically indicated. - Do not put on until we send order to do so o Home Health Nurse may d/c VAC for s/s of increased infection, significant wound regression, or uncontrolled drainage. [place] at [phone]. o Number of foam/gauze pieces used in the dressing = Medications-please add to medication list. Wound #1 Sacrum o Other: - TAC on back and shoulder Laboratory o Bacteria identified in Wound by Culture (MICRO) - tissue oooo LOINC Code: 2706-2 oooo Convenience Name: Wound culture routine Patient Medications Allergies: Lyrica, gabapentin, Sulfa (Sulfonamide Antibiotics), contrast dye, budesonide, iohexol, simvastatin, Demerol, meperidine, adhesive tape, naproxen, pregabalin Notifications Medication Indication Start End lidocaine DOSE 1 - topical 4 % cream - 1 cream topical Electronic Signature(s) Signed: 03/27/2018 4:32:24 PM By: Alric Quan Signed: 03/27/2018 5:32:46 PM By: Worthy Keeler PA-C Entered By: Alric Quan on 03/27/2018 15:37:01 [name], [name] ([id number]) [name], [name] ([id number]) -------------------------------------------------------------------------------- Prescription 03/27/2018 Patient Name: [name] Provider: Worthy Keeler PA-C Date of Birth: [birth year] NPI#: [id number] Sex: M DEA#: [id number] Phone #: [phone] License #: Patient  Address: [facility], [place] [address] [address], [place], [place] [address] [phone] Allergies Lyrica gabapentin Sulfa (Sulfonamide Antibiotics) contrast dye budesonide iohexol simvastatin Demerol meperidine adhesive tape naproxen pregabalin Medication Medication: Route: Strength: Form: lidocaine 4 % topical cream topical 4% cream Class: TOPICAL LOCAL ANESTHETICS Indication: [name], [name] ([id number]) Dose: Frequency / Time: 1 1 cream topical Number of Refills: Number of Units: 0 Generic Substitution: Start Date: End Date: One Time Use: Substitution Permitted No Note to Pharmacy: Signature(s): Date(s): Electronic Signature(s) Signed: 03/27/2018 4:32:24 PM By: Alric Quan Signed: 03/27/2018 5:32:46 PM By: Worthy Keeler PA-C Entered By: Alric Quan on 03/27/2018 15:37:02 [name] ([id number]) --------------------------------------------------------------------------------  Problem List Details Patient Name: [name] Date of Service: 03/27/2018 2:30 PM Medical Record Number: [id number] Patient Account Number: [id number] Date of Birth/Sex: 05-Mar-1931 (84 y.o. M) Treating RN: Ahmed Prima Primary Care Provider: Burman Freestone Other Clinician: Referring Provider: Burman Freestone Treating Provider/Extender: [name],  Weeks in Treatment: 7 Active Problems ICD-10 Impacting Encounter Code Description Active Date Wound Healing Diagnosis L89.154 Pressure ulcer of sacral region, stage 4 02/06/2018 No Yes L24.0 Irritant contact dermatitis due to detergents 02/06/2018 No Yes Inactive Problems Resolved Problems Electronic Signature(s) Signed: 03/27/2018 5:32:46 PM By: Worthy Keeler PA-C Entered By: Worthy Keeler on 03/27/2018 17:16:17 [name]  ([id number]) -------------------------------------------------------------------------------- Progress Note Details Patient Name: [name] Date of Service: 03/27/2018 2:30 PM Medical Record Number: [id number] Patient Account Number: [id number] Date of Birth/Sex: [birth year] (84 y.o. M) Treating RN: Ahmed Prima Primary Care Provider: Burman Freestone Other Clinician: Referring Provider: Burman Freestone Treating Provider/Extender: [name],  Weeks in Treatment: 7 Subjective Chief Complaint Information obtained from  Patient Sacral pressure ulcer and left shoulder rash History of Present Illness (HPI) 02/06/18 on evaluation today patient presents for initial evaluation and our clinic concerning an issue which began roughly 3 weeks ago when the patient fell in his home on the floor in his kitchen and laid him down this detergent for roughly 3 days. He had a pressure injury to the left shoulder. This unfortunately has caused him a lot of discomfort although it finally seems to be doing better if anything is really having a lot of itching right now. This appears potentially be a contact dermatitis issue. He also has a significant pressure injury to the sacrum at this time as well which is also showing fascia exposure right over the bone but no evidence of bone exposure at this point which is good news. They have been using Santyl as well as Saline soaked gauze at this point in time. There does appear to be a lot of necrotic slough in the base of the wound. He does have a history of incontinence, myocardial infarction, and hypertension. He also is "borderline diabetic" hemoglobin A1c of 6.0. Currently he has some discomfort in the pressure site at the sacrum but fortunately nothing too significant this did require sharp debridement today. 02/13/18 on evaluation today patient appears to be doing much better in regard to his sacral wound. He has been tolerating the dressing changes  without complication with the Vashe. Fortunately there is no evidence of infection and though there is some Slough on the surface of the wound bed he has excellent granulation noted. Overall I'm pleased with how things have progressed in that regard. A glance at his shoulder as well and the rash seems to be someone improving in my pinion at this site as well. Overall I am pleased with what we're seeing and so is the family. 02/20/18 on evaluation today patient appears to be doing a little worse in regard to the sacral wound only in the fact that there is redness surrounding it has me somewhat concerned for infection. The drainage has also apparently been a little bit off color compared to normal according to family they did keep the dressing today that was removed to show me and I agree this seems to be a little bit different compared to what we have been seeing. Coupled with the redness I'm concerned he may be developing some cellulitis surrounding the wound bed. 02/27/18 on evaluation today patient presents for follow-up concerning his sacral ulcer. We have received the results back from his wound culture which shows unfortunately that the doxycycline will not be of benefit for him I am going to need to initiate treatment with something else in order to treat the pseudomonas. Otherwise he does not seem to be having any significant pain although his daughter states there are sometimes when he states having pain. We continue to use the Vashe currently. 03/06/18 on evaluation today patient's sacral wound appears to be doing better in my opinion. He has been tolerating the dressing changes without complication. With that being said the silver nitrate has helped with the prominent area of hyper granulation at the 6 o'clock location we will likely need to repeat this again today. Nonetheless overall I am pleased with how things have improved over the last week. The erythema surrounding the wound seems to be  greatly improved. 03/13/18 on evaluation today patient's wound actually does not appear to be terribly infected although she does continue to have erythema surrounding the wound bed  especially on the left border. I'm still somewhat concerned about the fact that the oral antibiotics alone may not be completely treating his infection. I previously discussed may need to go for IV antibiotic therapy I'm concerned that may be the case. We will need to make a referral today for infectious disease. 03/20/18 on evaluation today the patient sacral wound actually appears to be doing fairly well in regard to granulation although he continues unfortunately to have it your theme is surrounding the periwound region. There's also some increased swelling at the 6 o'clock location which also has me somewhat concerned. With that being said he does have some discomfort but [name], [name]. ([id number]) nothing too significant at this point. He still has not heard from infectious disease his daughter and wife are both present during the office visit today they're going to check back with this again. We did get the information for them to call them today. 03/27/18 on evaluation today patient is seen concerning his ongoing sacral ulcer. He has been tolerating the dressing changes without complication. With that being said he does present with evidence of bright green drainage noted on the dressing which again is something that I do often expect to see with a pseudomonas infection. He continues to have your theme is surrounding the wound bed as well and again I'm not 100% convinced this is just pressure related. I did speak with Colletta [place] who is the nurse practitioner in [place] with infectious disease. I spoke with her actually yesterday concerning this patient. She is not 100% convinced that this is infected. She question whether the wound may just be colonized with Pseudomonas and not actually causing an active  infection. I am really not thinking that the edema is associated with pressure alone and again overall I don't feel that your theme and is consistent with a pressure injury either as he's never had any contusions noted like a deep tissue injury on his heel which was new and I did visualize today this was on the left heel. Nonetheless she wanted to give this a little bit more time and thought it would be appropriate to start the Wound VAC at this point. Patient History Information obtained from Patient. Family History Cancer - Paternal Grandparents, Diabetes - Father, Heart Disease - Mother,Father, Stroke - Father, No family history of Hypertension, Kidney Disease, Lung Disease, Seizures, Thyroid Problems, Tuberculosis. Social History Never smoker, Marital Status - Widowed, Alcohol Use - Never, Drug Use - No History, Caffeine Use - Daily. Medical History Hospitalization/Surgery History - 01/20/2018, [facility], [place]. 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 Respiratory The patient has no complaints or symptoms. Cardiovascular The patient has no complaints or symptoms. Psychiatric The patient has no complaints or symptoms. Objective Constitutional Well-nourished and well-hydrated in no acute distress. KHA, HARI ([id number]) Vitals Time Taken: 2:55 PM, Height: 69 in, Weight: 155 lbs, BMI: 22.9, Temperature: 99.2 F, Pulse: 69 bpm, Respiratory Rate: 16 breaths/min, Blood Pressure: 151/71 mmHg. Respiratory normal breathing without difficulty. clear to auscultation bilaterally. Cardiovascular regular rate and rhythm with normal S1, S2. Psychiatric this patient is able to make decisions and demonstrates good insight into disease process. Alert and Oriented x 3. pleasant and cooperative. General Notes: Actually did go ahead and  further inspect the wound and again I looked at the dressing that came off of the wound. He continues to have a very  bright green drainage which is something that often I see in reaction with pseudomonas in a wound reacting with the Dakin's. Nonetheless I think that one of the things to do at this point may be to actually perform a tissue culture to see if this will indeed show bacteria within the tissues may be giving light further to what may be going on at this point. I therefore did not perform a swamp rather two samples were taken of the granulation tissue for sin into the lab to initiate a tissue culture. He tolerated this without any significant discomfort. Integumentary (Hair, Skin) Wound #1 status is Open. Original cause of wound was Pressure Injury. The wound is located on the Sacrum. The wound measures 3.3cm length x 2.7cm width x 1.6cm depth; 6.998cm^2 area and 11.197cm^3 volume. There is Fat Layer (Subcutaneous Tissue) Exposed exposed. There is no tunneling noted, however, there is undermining starting at 2:00 and ending at 5:00 with a maximum distance of 1.7cm. There is a large amount of purulent drainage noted. The wound margin is distinct with the outline attached to the wound base. There is large (67-100%) red, pink, hyper - granulation within the wound bed. There is a small (1-33%) amount of necrotic tissue within the wound bed including Adherent Slough. The periwound skin appearance exhibited: Excoriation, Erythema. The periwound skin appearance did not exhibit: Callus, Crepitus, Induration, Rash, Scarring, Dry/Scaly, Maceration, Atrophie Blanche, Cyanosis, Ecchymosis, Hemosiderin Staining, Mottled, Pallor, Rubor. The surrounding wound skin color is noted with erythema which is circumferential. Periwound temperature was noted as No Abnormality. The periwound has tenderness on palpation. General Notes: bright green drainage noted Assessment Active Problems ICD-10 Pressure ulcer  of sacral region, stage 4 Irritant contact dermatitis due to detergents Plan Wound Cleansing: Wound #1 Sacrum: Clean wound with Normal Saline. Anesthetic (add to Medication List): Wound #1 SacrumARI, BERNABEI ([id number]) Topical Lidocaine 4% cream applied to wound bed prior to debridement (In Clinic Only). Skin Barriers/Peri-Wound Care: Wound #1 Sacrum: Barrier cream Primary Wound Dressing: Wound #1 Sacrum: Silver Alginate Secondary Dressing: Wound #1 Sacrum: ABD pad Dry Gauze Other - Drawtex or equal Dressing Change Frequency: Wound #1 Sacrum: Change dressing every day. Change Dressing Monday, Wednesday, Friday - NPWT when applied Follow-up Appointments: Wound #1 Sacrum: Return Appointment in 1 week. Off-Loading: Wound #1 Sacrum: Mattress - hospital bed Turn and reposition every 2 hours Other: - please make sure the pt is floating his heels while lying in bed Additional Orders / Instructions: Wound #1 Sacrum: Increase protein intake. Home Health: Wound #1 Sacrum: Continue Home Health Visits - [place] Nurse may visit PRN to address patient s wound care needs. FACE TO FACE ENCOUNTER: MEDICARE and MEDICAID PATIENTS: I certify that this patient is under my care and that I had a face-to-face encounter that meets the physician face-to-face encounter requirements with this patient on this date. The encounter with the patient was in whole or in part for the following MEDICAL CONDITION: (primary reason for [place]) MEDICAL NECESSITY: I certify, that based on my findings, NURSING services are a medically necessary home health service. HOME BOUND STATUS: I certify that my clinical findings support that this patient is homebound (i.e., Due to illness or injury, pt requires aid of supportive devices such as crutches, cane, wheelchairs, walkers, the use of special transportation or the assistance of another person to leave their place of residence. There is  a normal inability to leave the home and doing so requires considerable and taxing  effort. Other absences are for medical reasons / religious services and are infrequent or of short duration when for other reasons). If current dressing causes regression in wound condition, may D/C ordered dressing product/s and apply Normal Saline Moist Dressing daily until next [facility] / Other MD appointment. [place] of regression in wound condition at [phone]. Please direct any NON-WOUND related issues/requests for orders to patient's Primary Care Physician Negative Pressure Wound Therapy: Wound #1 Sacrum: Wound VAC settings at 125/130 mmHg continuous pressure. Use BLACK/GREEN foam to wound cavity. Use WHITE foam to fill any tunnel/s and/or undermining. Change VAC dressing 3 X WEEK. Change canister as indicated when full. Nurse may titrate settings and frequency of dressing changes as clinically indicated. - Do not put on until we send order to do so Home Health Nurse may d/c VAC for s/s of increased infection, significant wound regression, or uncontrolled drainage. [place] at [phone]. Number of foam/gauze pieces used in the dressing = Medications-please add to medication list.: Wound #1 Sacrum: Other: - TAC on back and shoulder Laboratory ordered were: Wound culture routine - tissue The following medication(s) was prescribed: lidocaine topical 4 % cream 1 1 cream topical was prescribed at facility VERL, KITSON ([id number]) Yetta Flock suggest currently that we go ahead and continue with the wound care measures per above although we will switch to silver alginate dressing over the deacons to see if this will be of benefit for the patient. They are in agreement with plan. I will see him for reevaluation in one weeks time and soon as I get the results of the tissue culture I will contact him as well or rather the family with the results. There  are in agreement with this plan. Please see above for specific wound care orders. We will see patient for re-evaluation in 1 week(s) here in the clinic. If anything worsens or changes patient will contact our office for additional recommendations. Electronic Signature(s) Signed: 03/27/2018 5:32:46 PM By: Worthy Keeler PA-C Entered By: Worthy Keeler on 03/27/2018 17:19:11 Kash, Davie [name] ([id number]) -------------------------------------------------------------------------------- ROS/PFSH Details Patient Name: [name] Date of Service: 03/27/2018 2:30 PM Medical Record Number: [id number] Patient Account Number: [id number] Date of Birth/Sex: Jan 16, 1931 (84 y.o. M) Treating RN: Ahmed Prima Primary Care Provider: Burman Freestone Other Clinician: Referring Provider: Burman Freestone Treating Provider/Extender: [name],  Weeks in Treatment: 7 Information Obtained From Patient Wound History Do you currently have one or more open woundso Yes How many open wounds do you currently haveo 2 Approximately how long have you had your woundso 3 weeks Has your wound(s) ever healed and then re-openedo Yes Have you had any lab work done in the past montho Yes Have you tested positive for osteomyelitis (bone infection)o No Have you had any tests for circulation on your legso No Constitutional Symptoms (General Health) Complaints and Symptoms: Negative for: Fever; Chills Eyes Medical History: Positive for: Cataracts - bilateral removal Negative for: Glaucoma; Optic Neuritis Ear/Nose/Mouth/Throat Medical History: Negative for: Chronic sinus problems/congestion; Middle ear problems Hematologic/Lymphatic Medical History: Negative for: Anemia; Hemophilia; Human Immunodeficiency Virus; Lymphedema; Sickle Cell Disease Respiratory Complaints and Symptoms: No Complaints or Symptoms Medical History: Positive for: Asthma Negative for: Aspiration; Chronic Obstructive Pulmonary  Disease (COPD); Pneumothorax; Sleep Apnea; Tuberculosis Cardiovascular Complaints and Symptoms: No Complaints or Symptoms Medical History: Positive for: Angina; Arrhythmia; Coronary Artery Disease; Hypertension; Myocardial Infarction - 2001 Negative for: Congestive Heart Failure; Deep Vein Thrombosis; Hypotension; Peripheral Arterial Disease;  Peripheral DERIAN, DIMALANTA ([id number]) Venous Disease; Phlebitis; Vasculitis Gastrointestinal Medical History: Negative for: Cirrhosis ; Colitis; Crohnos; Hepatitis A; Hepatitis B; Hepatitis C Endocrine Medical History: Negative for: Type I Diabetes; Type II Diabetes Past Medical History Notes: Borderline Genitourinary Medical History: Negative for: End Stage Renal Disease Immunological Medical History: Negative for: Lupus Erythematosus; Raynaudos; Scleroderma Integumentary (Skin) Medical History: Negative for: History of Burn; History of pressure wounds Musculoskeletal Medical History: Positive for: Osteoarthritis Negative for: Gout; Rheumatoid Arthritis; Osteomyelitis Neurologic Medical History: Positive for: Dementia; Neuropathy Negative for: Quadriplegia; Paraplegia; Seizure Disorder Oncologic Medical History: Negative for: Received Chemotherapy; Received Radiation Past Medical History Notes: Melanoma on back Psychiatric Complaints and Symptoms: No Complaints or Symptoms Medical History: Negative for: Anorexia/bulimia; Confinement Anxiety HBO Extended History Items Eyes: Cataracts TAMON, PARKERSON ([id number]) Immunizations Pneumococcal Vaccine: Received Pneumococcal Vaccination: Yes Implantable Devices Hospitalization / Surgery History Name of Hospital Purpose of Hospitalization/Surgery Date [facility] 01/20/2018 Family and Social History Cancer: Yes - Paternal Grandparents; Diabetes: Yes - Father; Heart Disease: Yes - Mother,Father; Hypertension: No; Kidney Disease: No; Lung Disease: No; Seizures: No; Stroke: Yes -  Father; Thyroid Problems: No; Tuberculosis: No; Never smoker; Marital Status - Widowed; Alcohol Use: Never; Drug Use: No History; Caffeine Use: Daily; Financial Concerns: No; Food, Clothing or Shelter Needs: No; Support System Lacking: No; Transportation Concerns: No; Advanced Directives: Yes (Not Provided); Patient does not want information on Advanced Directives; Do not resuscitate: Yes (Not Provided); Living Will: Yes (Not Provided); Medical Power of Attorney: Yes - Cesar Rogerson (Not Provided) Physician Affirmation I have reviewed and agree with the above information. Electronic Signature(s) Signed: 03/27/2018 5:32:46 PM By: Worthy Keeler PA-C Signed: 03/28/2018 3:54:16 PM By: Alric Quan Entered By: Worthy Keeler on 03/27/2018 17:17:47 Call, [name] ([id number]) -------------------------------------------------------------------------------- SuperBill Details Patient Name: [name] Date of Service: 03/27/2018 Medical Record Number: [id number] Patient Account Number: [id number] Date of Birth/Sex: 02/26/1931 (84 y.o. M) Treating RN: Ahmed Prima Primary Care Provider: Burman Freestone Other Clinician: Referring Provider: Burman Freestone Treating Provider/Extender: [name],  Weeks in Treatment: 7 Diagnosis Coding ICD-10 Codes Code Description L89.154 Pressure ulcer of sacral region, stage 4 L24.0 Irritant contact dermatitis due to detergents Facility Procedures CPT4 Code: [id number] Description: [facility] VISIT-LEV 3 EST PT Modifier: Quantity: 1 Physician Procedures CPT4 Code: 8338250 Description: 53976 - WC PHYS LEVEL 4 - EST PT ICD-10 Diagnosis Description L89.154 Pressure ulcer of sacral region, stage 4 L24.0 Irritant contact dermatitis due to detergents Modifier: Quantity: 1 Electronic Signature(s) Signed: 03/27/2018 5:32:46 PM By: Worthy Keeler PA-C Entered By: Worthy Keeler on 03/27/2018 17:19:25

## 2018-03-30 NOTE — Progress Notes (Signed)
[name], [name] ([id number]) Visit Report for 03/27/2018 Arrival Information Details Patient Name: [name], [name]. Date of Service: 03/27/2018 2:30 PM Medical Record Number: [id number] Patient Account Number: [id number] Date of Birth/Sex: [birth year] (84 y.o. M) Treating RN: Montey Hora Primary Care Aden Sek: Burman Freestone Other Clinician: Referring Shannia Jacuinde: Burman Freestone Treating Etherine Mackowiak/Extender: [name], HOYT Weeks in Treatment: 7 Visit Information History Since Last Visit Added or deleted any medications: No Patient Arrived: Cane Any new allergies or adverse reactions: No Arrival Time: 14:53 Had a fall or experienced change in No Accompanied By: family activities of daily living that may affect Transfer Assistance: None risk of falls: Patient Identification Verified: Yes Signs or symptoms of abuse/neglect since last visito No Secondary Verification Process Completed: Yes Hospitalized since last visit: No Patient Requires Transmission-Based Precautions: No Implantable device outside of the clinic excluding No Patient Has Alerts: Yes cellular tissue based products placed in the center since last visit: Has Dressing in Place as Prescribed: Yes Pain Present Now: No Electronic Signature(s) Signed: 03/27/2018 3:48:56 PM By: Montey Hora Entered By: Montey Hora on 03/27/2018 14:54:03 [name] ([id number]) -------------------------------------------------------------------------------- Clinic Level of Care Assessment Details Patient Name: [name] Date of Service: 03/27/2018 2:30 PM Medical Record Number: [id number] Patient Account Number: [id number] Date of Birth/Sex: 22-Mar-1931 (84 y.o. M) Treating RN: Ahmed Prima Primary Care Manuel Lawhead: Burman Freestone Other Clinician: Referring Benney [place]: Burman Freestone Treating Nicolaas Savo/Extender: [name], HOYT Weeks in Treatment: 7 Clinic Level of Care Assessment Items TOOL 4 Quantity Score X -  Use when only an EandM is performed on FOLLOW-UP visit 1 0 ASSESSMENTS - Nursing Assessment / Reassessment X - Reassessment of Co-morbidities (includes updates in patient status) 1 10 X- 1 5 Reassessment of Adherence to Treatment Plan ASSESSMENTS - Wound and Skin Assessment / Reassessment X - Simple Wound Assessment / Reassessment - one wound 1 5 []  - 0 Complex Wound Assessment / Reassessment - multiple wounds []  - 0 Dermatologic / Skin Assessment (not related to wound area) ASSESSMENTS - Focused Assessment []  - Circumferential Edema Measurements - multi extremities 0 []  - 0 Nutritional Assessment / Counseling / Intervention []  - 0 Lower Extremity Assessment (monofilament, tuning fork, pulses) []  - 0 Peripheral Arterial Disease Assessment (using hand held doppler) ASSESSMENTS - Ostomy and/or Continence Assessment and Care []  - Incontinence Assessment and Management 0 []  - 0 Ostomy Care Assessment and Management (repouching, etc.) PROCESS - Coordination of Care []  - Simple Patient / Family Education for ongoing care 0 X- 1 20 Complex (extensive) Patient / Family Education for ongoing care X- 1 10 Staff obtains Programmer, systems, Records, Test Results / Process Orders X- 1 10 Staff telephones HHA, Nursing Homes / Clarify orders / etc []  - 0 Routine Transfer to another Facility (non-emergent condition) []  - 0 Routine Hospital Admission (non-emergent condition) []  - 0 New Admissions / Biomedical engineer / Ordering NPWT, Apligraf, etc. []  - 0 Emergency Hospital Admission (emergent condition) X- 1 10 Simple Discharge Coordination [name], [name] ([id number]) []  - 0 Complex (extensive) Discharge Coordination PROCESS - Special Needs []  - Pediatric / Minor Patient Management 0 []  - 0 Isolation Patient Management []  - 0 Hearing / Language / Visual special needs []  - 0 Assessment of Community assistance (transportation, D/C planning, etc.) []  - 0 Additional assistance /  Altered mentation []  - 0 Support Surface(s) Assessment (bed, cushion, seat, etc.) INTERVENTIONS - Wound Cleansing / Measurement X - Simple Wound Cleansing - one wound 1 5 []  - 0 Complex Wound  Cleansing - multiple wounds X- 1 5 Wound Imaging (photographs - any number of wounds) []  - 0 Wound Tracing (instead of photographs) X- 1 5 Simple Wound Measurement - one wound []  - 0 Complex Wound Measurement - multiple wounds INTERVENTIONS - Wound Dressings X - Small Wound Dressing one or multiple wounds 1 10 []  - 0 Medium Wound Dressing one or multiple wounds []  - 0 Large Wound Dressing one or multiple wounds X- 1 5 Application of Medications - topical []  - 0 Application of Medications - injection INTERVENTIONS - Miscellaneous []  - External ear exam 0 X- 1 5 Specimen Collection (cultures, biopsies, blood, body fluids, etc.) X- 1 5 Specimen(s) / Culture(s) sent or taken to Lab for analysis []  - 0 Patient Transfer (multiple staff / Civil Service fast streamer / Similar devices) []  - 0 Simple Staple / Suture removal (25 or less) []  - 0 Complex Staple / Suture removal (26 or more) []  - 0 Hypo / Hyperglycemic Management (close monitor of Blood Glucose) []  - 0 Ankle / Brachial Index (ABI) - do not check if billed separately X- 1 5 Vital Signs [name], [name] ([id number]) Has the patient been seen at the hospital within the last three years: Yes Total Score: 115 Level Of Care: New/Established - Level 3 Electronic Signature(s) Signed: 03/27/2018 4:32:24 PM By: Alric Quan Entered By: Alric Quan on 03/27/2018 16:23:19 [name] ([id number]) -------------------------------------------------------------------------------- Encounter Discharge Information Details Patient Name: [name] Date of Service: 03/27/2018 2:30 PM Medical Record Number: [id number] Patient Account Number: [id number] Date of Birth/Sex: 1931/02/09 (84 y.o. M) Treating RN: Roger Shelter Primary Care  Kahil Agner: Burman Freestone Other Clinician: Referring Tharon Bomar: Burman Freestone Treating Caydence Koenig/Extender: [name], HOYT Weeks in Treatment: 7 Encounter Discharge Information Items Discharge Condition: Stable Ambulatory Status: Wheelchair Discharge Destination: Home Transportation: Private Auto Schedule Follow-up Appointment: Yes Clinical Summary of Care: Electronic Signature(s) Signed: 03/27/2018 4:06:48 PM By: Roger Shelter Entered By: Roger Shelter on 03/27/2018 15:47:17 [name] ([id number]) -------------------------------------------------------------------------------- Lower Extremity Assessment Details Patient Name: [name] Date of Service: 03/27/2018 2:30 PM Medical Record Number: [id number] Patient Account Number: [id number] Date of Birth/Sex: 07-Sep-1931 (84 y.o. M) Treating RN: Montey Hora Primary Care Hlee Fringer: Burman Freestone Other Clinician: Referring Jose Alleyne: Burman Freestone Treating Sona Nations/Extender: [name], HOYT Weeks in Treatment: 7 Electronic Signature(s) Signed: 03/27/2018 3:48:56 PM By: Montey Hora Entered By: Montey Hora on 03/27/2018 15:04:00 [name] ([id number]) -------------------------------------------------------------------------------- Multi Wound Chart Details Patient Name: [name] Date of Service: 03/27/2018 2:30 PM Medical Record Number: [id number] Patient Account Number: [id number] Date of Birth/Sex: 02/09/1931 (84 y.o. M) Treating RN: Ahmed Prima Primary Care Roi Jafari: Burman Freestone Other Clinician: Referring Vega Withrow: Burman Freestone Treating Jennessa Trigo/Extender: STONE III, HOYT Weeks in Treatment: 7 Vital Signs Height(in): 69 Pulse(bpm): 28 Weight(lbs): 155 Blood Pressure(mmHg): 151/71 Body Mass Index(BMI): 23 Temperature(F): 99.2 Respiratory Rate 16 (breaths/min): Photos: [1:No Photos] [N/A:N/A] Wound Location: [1:Sacrum] [N/A:N/A] Wounding Event: [1:Pressure  Injury] [N/A:N/A] Primary Etiology: [1:Pressure Ulcer] [N/A:N/A] Comorbid History: [1:Cataracts, Asthma, Angina, Arrhythmia, Coronary Artery Disease, Hypertension, Myocardial Infarction, Osteoarthritis, Dementia, Neuropathy] [N/A:N/A] Date Acquired: [1:01/17/2018] [N/A:N/A] Weeks of Treatment: [1:7] [N/A:N/A] Wound Status: [1:Open] [N/A:N/A] Measurements L x W x D [1:3.3x2.7x1.6] [N/A:N/A] (cm) Area (cm) : [1:6.998] [N/A:N/A] Volume (cm) : [1:11.197] [N/A:N/A] % Reduction in Area: [1:65.70%] [N/A:N/A] % Reduction in Volume: [1:72.60%] [N/A:N/A] Starting Position 1 [1:2] (o'clock): Ending Position 1 [1:5] (o'clock): Maximum Distance 1 (cm): [1:1.7] Undermining: [1:Yes] [N/A:N/A] Classification: [1:Category/Stage III] [N/A:N/A] Exudate Amount: [1:Large] [N/A:N/A] Exudate Type: [1:Purulent] [N/A:N/A]  Exudate Color: [1:yellow, brown, green] [N/A:N/A] Wound Margin: [1:Distinct, outline attached] [N/A:N/A] Granulation Amount: [1:Large (67-100%)] [N/A:N/A] Granulation Quality: [1:Red, Pink, Hyper-granulation] [N/A:N/A] Necrotic Amount: [1:Small (1-33%)] [N/A:N/A] Exposed Structures: [1:Fat Layer (Subcutaneous Tissue) Exposed: Yes Fascia: No Tendon: No] [N/A:N/A] Muscle: No Joint: No Bone: No Epithelialization: None N/A N/A Periwound Skin Texture: Excoriation: Yes N/A N/A Induration: No Callus: No Crepitus: No Rash: No Scarring: No Periwound Skin Moisture: Maceration: No N/A N/A Dry/Scaly: No Periwound Skin Color: Erythema: Yes N/A N/A Atrophie Blanche: No Cyanosis: No Ecchymosis: No Hemosiderin Staining: No Mottled: No Pallor: No Rubor: No Erythema Location: Circumferential N/A N/A Temperature: No Abnormality N/A N/A Tenderness on Palpation: Yes N/A N/A Wound Preparation: Ulcer Cleansing: N/A N/A Rinsed/Irrigated with Saline Topical Anesthetic Applied: Other: lidocaine 4% Assessment Notes: bright green drainage noted N/A N/A Treatment Notes Electronic  Signature(s) Signed: 03/27/2018 4:32:24 PM By: Alric Quan Entered By: Alric Quan on 03/27/2018 15:13:50 [name] ([id number]) -------------------------------------------------------------------------------- Multi-Disciplinary Care Plan Details Patient Name: [name], [name]. Date of Service: 03/27/2018 2:30 PM Medical Record Number: [id number] Patient Account Number: [id number] Date of Birth/Sex: September 13, 1931 (84 y.o. M) Treating RN: Ahmed Prima Primary Care Al Bracewell: Burman Freestone Other Clinician: Referring River Ambrosio: Burman Freestone Treating Tavaris Eudy/Extender: [name], HOYT Weeks in Treatment: 7 Active Inactive ` Abuse / Safety / Falls / Self Care Management Nursing Diagnoses: History of Falls Goals: Patient will not experience any injury related to falls Date Initiated: 02/06/2018 Target Resolution Date: 03/08/2018 Goal Status: Active Interventions: Assess fall risk on admission and as needed Treatment Activities: Patient referred to home care : 02/06/2018 Notes: ` Necrotic Tissue Nursing Diagnoses: Impaired tissue integrity related to necrotic/devitalized tissue Goals: Necrotic/devitalized tissue will be minimized in the wound bed Date Initiated: 02/06/2018 Target Resolution Date: 03/08/2018 Goal Status: Active Interventions: Assess patient pain level pre-, during and post procedure and prior to discharge Treatment Activities: Excisional debridement : 02/06/2018 Notes: ` Nutrition Nursing Diagnoses: Potential for alteratiion in Nutrition/Potential for imbalanced nutrition [name], [name] ([id number]) Goals: Patient/caregiver agrees to and verbalizes understanding of need to obtain nutritional consultation Date Initiated: 02/06/2018 Target Resolution Date: 03/08/2018 Goal Status: Active Interventions: Provide education on nutrition Notes: ` Orientation to the Wound Care Program Nursing Diagnoses: Knowledge deficit related to the wound  healing center program Goals: Patient/caregiver will verbalize understanding of the [place] Program Date Initiated: 02/06/2018 Target Resolution Date: 03/08/2018 Goal Status: Active Interventions: Provide education on orientation to the wound center Notes: ` Pressure Nursing Diagnoses: Knowledge deficit related to management of pressures ulcers Goals: Patient/caregiver will verbalize understanding of pressure ulcer management Date Initiated: 02/06/2018 Target Resolution Date: 03/08/2018 Goal Status: Active Interventions: Assess offloading mechanisms upon admission and as needed Provide education on pressure ulcers Notes: ` Wound/Skin Impairment Nursing Diagnoses: Knowledge deficit related to ulceration/compromised skin integrity Goals: Ulcer/skin breakdown will have a volume reduction of 80% by week 12 Date Initiated: 02/06/2018 Target Resolution Date: 04/08/2018 Goal Status: Active [name], [name] ([id number]) Interventions: Assess ulceration(s) every visit Treatment Activities: Skin care regimen initiated : 02/06/2018 Topical wound management initiated : 02/06/2018 Notes: Electronic Signature(s) Signed: 03/27/2018 4:32:24 PM By: Alric Quan Entered By: Alric Quan on 03/27/2018 15:13:43 [name] ([id number]) -------------------------------------------------------------------------------- Pain Assessment Details Patient Name: [name] Date of Service: 03/27/2018 2:30 PM Medical Record Number: [id number] Patient Account Number: [id number] Date of Birth/Sex: 08-13-1931 (84 y.o. M) Treating RN: Montey Hora Primary Care Draya Felker: Burman Freestone Other Clinician: Referring Keon Benscoter: Burman Freestone Treating Jayko Voorhees/Extender: STONE III, HOYT Weeks in Treatment: 7  Active Problems Location of Pain Severity and Description of Pain Patient Has Paino Yes Site Locations Pain Location: Pain in Ulcers With Dressing Change: Yes Duration  of the Pain. Constant / Intermittento Constant Pain Management and Medication Current Pain Management: Electronic Signature(s) Signed: 03/27/2018 3:48:56 PM By: Montey Hora Entered By: Montey Hora on 03/27/2018 14:54:17 [name] ([id number]) -------------------------------------------------------------------------------- Patient/Caregiver Education Details Patient Name: [name] Date of Service: 03/27/2018 2:30 PM Medical Record Number: [id number] Patient Account Number: [id number] Date of Birth/Gender: [birth year] (84 y.o. M) Treating RN: Roger Shelter Primary Care Physician: Burman Freestone Other Clinician: Referring Physician: Burman Freestone Treating Physician/Extender: Sharalyn Ink in Treatment: 7 Education Assessment Education Provided To: Patient Education Topics Provided Wound Debridement: Methods: Explain/Verbal Responses: State content correctly Wound/Skin Impairment: Handouts: Caring for Your Ulcer Methods: Explain/Verbal Responses: State content correctly Electronic Signature(s) Signed: 03/27/2018 4:06:48 PM By: Roger Shelter Entered By: Roger Shelter on 03/27/2018 15:51:06 [name] ([id number]) -------------------------------------------------------------------------------- Wound Assessment Details Patient Name: [name] Date of Service: 03/27/2018 2:30 PM Medical Record Number: [id number] Patient Account Number: [id number] Date of Birth/Sex: 11-Apr-1931 (84 y.o. M) Treating RN: Montey Hora Primary Care Jin Shockley: Burman Freestone Other Clinician: Referring Mikailah Morel: Burman Freestone Treating Miaa Latterell/Extender: [name], HOYT Weeks in Treatment: 7 Wound Status Wound Number: 1 Primary Pressure Ulcer Etiology: Wound Location: Sacrum Wound Open Wounding Event: Pressure Injury Status: Date Acquired: 01/17/2018 Comorbid Cataracts, Asthma, Angina, Arrhythmia, Weeks Of Treatment: 7 History: Coronary Artery  Disease, Hypertension, Clustered Wound: No Myocardial Infarction, Osteoarthritis, Dementia, Neuropathy Photos Photo Uploaded By: Montey Hora on 03/27/2018 15:27:13 Wound Measurements Length: (cm) 3.3 Width: (cm) 2.7 Depth: (cm) 1.6 Area: (cm) 6.998 Volume: (cm) 11.197 % Reduction in Area: 65.7% % Reduction in Volume: 72.6% Epithelialization: None Tunneling: No Undermining: Yes Starting Position (o'clock): 2 Ending Position (o'clock): 5 Maximum Distance: (cm) 1.7 Wound Description Classification: Category/Stage III Wound Margin: Distinct, outline attached Exudate Amount: Large Exudate Type: Purulent Exudate Color: yellow, brown, green Foul Odor After Cleansing: No Slough/Fibrino Yes Wound Bed Granulation Amount: Large (67-100%) Exposed Structure Granulation Quality: Red, Pink, Hyper-granulation Fascia Exposed: No Necrotic Amount: Small (1-33%) Fat Layer (Subcutaneous Tissue) Exposed: Yes Necrotic Quality: Adherent Slough Tendon Exposed: No [name], [name] ([id number]) Muscle Exposed: No Joint Exposed: No Bone Exposed: No Periwound Skin Texture Texture Color No Abnormalities Noted: No No Abnormalities Noted: No Callus: No Atrophie Blanche: No Crepitus: No Cyanosis: No Excoriation: Yes Ecchymosis: No Induration: No Erythema: Yes Rash: No Erythema Location: Circumferential Scarring: No Hemosiderin Staining: No Mottled: No Moisture Pallor: No No Abnormalities Noted: No Rubor: No Dry / Scaly: No Maceration: No Temperature / Pain Temperature: No Abnormality Tenderness on Palpation: Yes Wound Preparation Ulcer Cleansing: Rinsed/Irrigated with Saline Topical Anesthetic Applied: Other: lidocaine 4%, Assessment Notes bright green drainage noted Treatment Notes Wound #1 (Sacrum) 1. Cleansed with: Clean wound with Normal Saline 2. Anesthetic Topical Lidocaine 4% cream to wound bed prior to debridement 4. Dressing Applied: Other dressing  (specify in notes) Notes silvercell covered with Drawtex and ABD secured with paper tape. Electronic Signature(s) Signed: 03/27/2018 3:48:56 PM By: Montey Hora Entered By: Montey Hora on 03/27/2018 15:03:42 [name] ([id number]) -------------------------------------------------------------------------------- Vitals Details Patient Name: [name] Date of Service: 03/27/2018 2:30 PM Medical Record Number: [id number] Patient Account Number: [id number] Date of Birth/Sex: 03-Mar-1931 (84 y.o. M) Treating RN: Montey Hora Primary Care Randalyn Ahmed: Burman Freestone Other Clinician: Referring Terasa Orsini: Burman Freestone Treating Kenyah Luba/Extender: STONE III, HOYT Weeks in Treatment: 7 Vital Signs Time Taken:  14:55 Temperature (F): 99.2 Height (in): 69 Pulse (bpm): 69 Weight (lbs): 155 Respiratory Rate (breaths/min): 16 Body Mass Index (BMI): 22.9 Blood Pressure (mmHg): 151/71 Reference Range: 80 - 120 mg / dl Electronic Signature(s) Signed: 03/27/2018 3:48:56 PM By: Montey Hora Entered By: Montey Hora on 03/27/2018 14:55:26

## 2018-04-02 LAB — AEROBIC/ANAEROBIC CULTURE W GRAM STAIN (SURGICAL/DEEP WOUND)

## 2018-04-03 ENCOUNTER — Encounter: Payer: Medicare Other | Admitting: Physician Assistant

## 2018-04-03 DIAGNOSIS — L89154 Pressure ulcer of sacral region, stage 4: Secondary | ICD-10-CM | POA: Diagnosis not present

## 2018-04-07 NOTE — Progress Notes (Signed)
LEAVY, HEATHERLY ([id number]) Visit Report for 04/03/2018 Chief Complaint Document Details Patient Name: [name], [name]. Date of Service: 04/03/2018 9:30 AM Medical Record Number: [id number] Patient Account Number: [id number] Date of Birth/Sex: 1931/09/20 (84 y.o. M) Treating RN: Ahmed Prima Primary Care Provider: Burman Freestone Other Clinician: Referring Provider: Burman Freestone Treating Provider/Extender: [name], HOYT Weeks in Treatment: 8 Information Obtained from: Patient Chief Complaint Sacral pressure ulcer and left shoulder rash Electronic Signature(s) Signed: 04/04/2018 7:52:50 AM By: Worthy Keeler PA-C Entered By: Worthy Keeler on 04/03/2018 10:03:41 [name] ([id number]) -------------------------------------------------------------------------------- Debridement Details Patient Name: [name] Date of Service: 04/03/2018 9:30 AM Medical Record Number: [id number] Patient Account Number: [id number] Date of Birth/Sex: 1930-11-11 (84 y.o. M) Treating RN: Ahmed Prima Primary Care Provider: Burman Freestone Other Clinician: Referring Provider: Burman Freestone Treating Provider/Extender: [name], HOYT Weeks in Treatment: 8 Debridement Performed for Wound #1 Sacrum Assessment: Performed By: Physician STONE III, HOYT E., PA-C Debridement Type: Debridement Pre-procedure Verification/Time Yes - 10:13 Out Taken: Start Time: 10:13 Pain Control: Lidocaine 4% Topical Solution Total Area Debrided (L x W): 2.9 (cm) x 2.7 (cm) = 7.83 (cm) Tissue and other material Viable, Non-Viable, Slough, Subcutaneous, Fibrin/Exudate, Slough debrided: Level: Skin/Subcutaneous Tissue Debridement Description: Excisional Instrument: Curette Bleeding: Minimum Hemostasis Achieved: Pressure End Time: 10:16 Procedural Pain: 0 Post Procedural Pain: 0 Response to Treatment: Procedure was tolerated well Level of Consciousness: Awake and Alert Post Procedure  Vitals: Temperature: 98.1 Pulse: 66 Respiratory Rate: 16 Blood Pressure: Systolic Blood Pressure: 664 Diastolic Blood Pressure: 79 Post Debridement Measurements of Total Wound Length: (cm) 2.9 Stage: Category/Stage III Width: (cm) 2.7 Depth: (cm) 1.4 Volume: (cm) 8.61 Character of Wound/Ulcer Post Requires Further Debridement Debridement: Post Procedure Diagnosis Same as Pre-procedure Electronic Signature(s) Signed: 04/03/2018 4:21:54 PM By: Alric Quan Signed: 04/04/2018 7:52:50 AM By: Worthy Keeler PA-C Entered By: Alric Quan on 04/03/2018 10:16:50 [name], [name] ([id number], [name] ([id number]) -------------------------------------------------------------------------------- HPI Details Patient Name: [name] Date of Service: 04/03/2018 9:30 AM Medical Record Number: [id number] Patient Account Number: [id number] Date of Birth/Sex: August 11, 1931 (84 y.o. M) Treating RN: Ahmed Prima Primary Care Provider: Burman Freestone Other Clinician: Referring Provider: Burman Freestone Treating Provider/Extender: [name], HOYT Weeks in Treatment: 8 History of Present Illness HPI Description: 02/06/18 on evaluation today patient presents for initial evaluation and our clinic concerning an issue which began roughly 3 weeks ago when the patient fell in his home on the floor in his kitchen and laid him down this detergent for roughly 3 days. He had a pressure injury to the left shoulder. This unfortunately has caused him a lot of discomfort although it finally seems to be doing better if anything is really having a lot of itching right now. This appears potentially be a contact dermatitis issue. He also has a significant pressure injury to the sacrum at this time as well which is also showing fascia exposure right over the bone but no evidence of bone exposure at this point which is good news. They have been using Santyl as well as Saline soaked gauze at  this point in time. There does appear to be a lot of necrotic slough in the base of the wound. He does have a history of incontinence, myocardial infarction, and hypertension. He also is "borderline diabetic" hemoglobin A1c of 6.0. Currently he has some discomfort in the pressure site at the sacrum but fortunately nothing too significant this did require sharp debridement today. 02/13/18 on evaluation  today patient appears to be doing much better in regard to his sacral wound. He has been tolerating the dressing changes without complication with the Vashe. Fortunately there is no evidence of infection and though there is some Slough on the surface of the wound bed he has excellent granulation noted. Overall I'm pleased with how things have progressed in that regard. A glance at his shoulder as well and the rash seems to be someone improving in my pinion at this site as well. Overall I am pleased with what we're seeing and so is the family. 02/20/18 on evaluation today patient appears to be doing a little worse in regard to the sacral wound only in the fact that there is redness surrounding it has me somewhat concerned for infection. The drainage has also apparently been a little bit off color compared to normal according to family they did keep the dressing today that was removed to show me and I agree this seems to be a little bit different compared to what we have been seeing. Coupled with the redness I'm concerned he may be developing some cellulitis surrounding the wound bed. 02/27/18 on evaluation today patient presents for follow-up concerning his sacral ulcer. We have received the results back from his wound culture which shows unfortunately that the doxycycline will not be of benefit for him I am going to need to initiate treatment with something else in order to treat the pseudomonas. Otherwise he does not seem to be having any significant pain although his daughter states there are sometimes  when he states having pain. We continue to use the Vashe currently. 03/06/18 on evaluation today patient's sacral wound appears to be doing better in my opinion. He has been tolerating the dressing changes without complication. With that being said the silver nitrate has helped with the prominent area of hyper granulation at the 6 o'clock location we will likely need to repeat this again today. Nonetheless overall I am pleased with how things have improved over the last week. The erythema surrounding the wound seems to be greatly improved. 03/13/18 on evaluation today patient's wound actually does not appear to be terribly infected although she does continue to have erythema surrounding the wound bed especially on the left border. I'm still somewhat concerned about the fact that the oral antibiotics alone may not be completely treating his infection. I previously discussed may need to go for IV antibiotic therapy I'm concerned that may be the case. We will need to make a referral today for infectious disease. 03/20/18 on evaluation today the patient sacral wound actually appears to be doing fairly well in regard to granulation although he continues unfortunately to have it your theme is surrounding the periwound region. There's also some increased swelling at the 6 o'clock location which also has me somewhat concerned. With that being said he does have some discomfort but nothing too significant at this point. He still has not heard from infectious disease his daughter and wife are both present during the office visit today they're going to check back with this again. We did get the information for them to call them today. 03/27/18 on evaluation today patient is seen concerning his ongoing sacral ulcer. He has been tolerating the dressing changes without complication. With that being said he does present with evidence of bright green drainage noted on the dressing which again is something that I do often  expect to see with a pseudomonas infection. He continues to have your theme is Mohabir,  [name] ([id number]) surrounding the wound bed as well and again I'm not 100% convinced this is just pressure related. I did speak with Colletta [place] who is the nurse practitioner in [place] with infectious disease. I spoke with her actually yesterday concerning this patient. She is not 100% convinced that this is infected. She question whether the wound may just be colonized with Pseudomonas and not actually causing an active infection. I am really not thinking that the edema is associated with pressure alone and again overall I don't feel that your theme and is consistent with a pressure injury either as he's never had any contusions noted like a deep tissue injury on his heel which was new and I did visualize today this was on the left heel. Nonetheless she wanted to give this a little bit more time and thought it would be appropriate to start the Wound VAC at this point. 04/03/18 on evaluation today patient sacral ulcer actually appears to be doing fairly well at this point. He has been tolerating the dressing changes without complication. With that being said I'm very pleased with the progress that has been made in regard to his sacral wound over the past week I do not see as much in the way of erythema which is great news. Nonetheless he does have a small area of hyper granulation unfortunately. This is at roughly the 7 o'clock location and I think does need to be addressed so that this will heal more appropriately. Nonetheless I think we may be ready to go ahead and initiate therapy with the Wound VAC. Electronic Signature(s) Signed: 04/04/2018 7:52:50 AM By: Worthy Keeler PA-C Entered By: Worthy Keeler on 04/03/2018 17:21:38 [name] ([id number]) -------------------------------------------------------------------------------- Physical Exam Details Patient Name: [name], [name] Date of  Service: 04/03/2018 9:30 AM Medical Record Number: [id number] Patient Account Number: [id number] Date of Birth/Sex: 01-13-1931 (84 y.o. M) Treating RN: Ahmed Prima Primary Care Provider: Burman Freestone Other Clinician: Referring Provider: Burman Freestone Treating Provider/Extender: STONE III, HOYT Weeks in Treatment: 8 Constitutional Well-nourished and well-hydrated in no acute distress. Respiratory normal breathing without difficulty. clear to auscultation bilaterally. Cardiovascular regular rate and rhythm with normal S1, S2. Psychiatric this patient is able to make decisions and demonstrates good insight into disease process. Alert and Oriented x 3. pleasant and cooperative. Notes At this point I'm gonna suggest that we go ahead and sharply debride the area of hyper granulation away so this will continue to heal more appropriately. Patient and his daughters are in agreement with the plan. Subsequently he tolerated this well without complication. Post debridement this look better he did have some bleeding that required some silver nitrate to completely cauterize the area but again he tolerated this well. I do think that we can begin treatment with the Wound VAC especially considering that I am going to place them on Augmentin based on the culture results today. Hopefully this should take care of the infection and again this was a tissue culture. Electronic Signature(s) Signed: 04/04/2018 7:52:50 AM By: Worthy Keeler PA-C Entered By: Worthy Keeler on 04/03/2018 17:22:27 [name] ([id number]) -------------------------------------------------------------------------------- Physician Orders Details Patient Name: [name], [name] Date of Service: 04/03/2018 9:30 AM Medical Record Number: [id number] Patient Account Number: [id number] Date of Birth/Sex: September 21, 1931 (84 y.o. M) Treating RN: Ahmed Prima Primary Care Provider: Burman Freestone Other Clinician: Referring  Provider: Burman Freestone Treating Provider/Extender: [name], HOYT Weeks in Treatment: 8 Verbal / Phone Orders: Yes Clinician: Carolyne Fiscal, Debi Read  Back and Verified: Yes Diagnosis Coding ICD-10 Coding Code Description L89.154 Pressure ulcer of sacral region, stage 4 L24.0 Irritant contact dermatitis due to detergents Wound Cleansing Wound #1 Sacrum o Clean wound with Normal Saline. Anesthetic (add to Medication List) Wound #1 Sacrum o Topical Lidocaine 4% cream applied to wound bed prior to debridement (In Clinic Only). Skin Barriers/Peri-Wound Care Wound #1 Sacrum o Barrier cream Primary Wound Dressing Wound #1 Sacrum o Other: - Dakins wet to dry gauze (until wound vac is placed on HHRN) Secondary Dressing Wound #1 Sacrum o ABD pad - (until wound vac is placed on HHRN) o Dry Gauze - (until wound vac is placed on HHRN) o Other - Drawtex or equal (until wound vac is placed on HHRN) Dressing Change Frequency Wound #1 Sacrum o Change dressing every day. o Change Dressing Monday, Wednesday, Friday - NPWT when applied Follow-up Appointments Wound #1 Sacrum o Return Appointment in 1 week. Off-Loading Wound #1 Sacrum o Mattress - hospital bed o Turn and reposition every 2 hours o Other: - please make sure the pt is floating his heels while lying in bed [name], [name]. ([id number]) Additional Orders / Instructions Wound #1 Sacrum o Increase protein intake. Home Health Wound #1 [place] [facility] to place wound vac on 6/19 /19) o Home Health Nurse may visit PRN to address patientos wound care needs. o FACE TO FACE ENCOUNTER: MEDICARE and MEDICAID PATIENTS: I certify that this patient is under my care and that I had a face-to-face encounter that meets the physician face-to-face encounter requirements with this patient on this date. The encounter with the patient was in whole or in part for the  following MEDICAL CONDITION: (primary reason for [place]) MEDICAL NECESSITY: I certify, that based on my findings, NURSING services are a medically necessary home health service. HOME BOUND STATUS: I certify that my clinical findings support that this patient is homebound (i.e., Due to illness or injury, pt requires aid of supportive devices such as crutches, cane, wheelchairs, walkers, the use of special transportation or the assistance of another person to leave their place of residence. There is a normal inability to leave the home and doing so requires considerable and taxing effort. Other absences are for medical reasons / religious services and are infrequent or of short duration when for other reasons). o If current dressing causes regression in wound condition, may D/C ordered dressing product/s and apply Normal Saline Moist Dressing daily until next [place] / Other MD appointment. [place] of regression in wound condition at [phone]. o Please direct any NON-WOUND related issues/requests for orders to patient's Primary Care Physician Negative Pressure Wound Therapy Wound #1 Sacrum o Wound VAC settings at 125/130 mmHg continuous pressure. Use BLACK/GREEN foam to wound cavity. Use WHITE foam to fill any tunnel/s and/or undermining. Change VAC dressing 3 X WEEK. Change canister as indicated when full. Nurse may titrate settings and frequency of dressing changes as clinically indicated. - HHRN to place wound vac on 6/19 /19 o Home Health Nurse may d/c VAC for s/s of increased infection, significant wound regression, or uncontrolled drainage. [place] at [phone]. o Number of foam/gauze pieces used in the dressing = Medications-please add to medication list. Wound #1 Sacrum o Other: - TAC on back and shoulder Patient Medications Allergies: Lyrica, gabapentin, Sulfa (Sulfonamide Antibiotics), contrast dye,  budesonide, iohexol, simvastatin, Demerol, meperidine, adhesive tape, naproxen, pregabalin Notifications Medication Indication Start End  lidocaine DOSE 1 - topical 4 % cream - 1 cream topical Augmentin 04/03/2018 DOSE 1 - oral 875 mg-125 mg tablet - 1 tablet oral taken 2 times a day for 10 days Electronic Signature(s) Signed: 04/03/2018 10:25:26 AM By: Worthy Keeler PA-C Entered By: Worthy Keeler on 04/03/2018 10:25:25 [name] ([id number]) [name], [name] ([id number]) -------------------------------------------------------------------------------- Prescription 04/03/2018 Patient Name: [name] Provider: Worthy Keeler PA-C Date of Birth: 14-Oct-1931 NPI#: [id number] Sex: M DEA#: [id number] Phone #: [phone] License #: Patient Address: [facility] [place], [place] [address] [address] [place], [place] [address] [phone] Allergies Lyrica gabapentin Sulfa (Sulfonamide Antibiotics) contrast dye budesonide iohexol simvastatin Demerol meperidine adhesive tape naproxen pregabalin Medication Medication: Route: Strength: Form: lidocaine 4 % topical cream topical 4% cream Class: TOPICAL LOCAL ANESTHETICS Indication: [name], [name] ([id number]) Dose: Frequency / Time: 1 1 cream topical Number of Refills: Number of Units: 0 Generic Substitution: Start Date: End Date: One Time Use: Substitution Permitted No Note to Pharmacy: Signature(s): Date(s): Electronic Signature(s) Signed: 04/04/2018 7:52:50 AM By: Worthy Keeler PA-C Entered By: Worthy Keeler on 04/03/2018 10:25:27 [name] ([id number]) --------------------------------------------------------------------------------  Problem List Details Patient Name: [name] Date of Service: 04/03/2018 9:30 AM Medical Record Number: [id number] Patient Account Number:  [id number] Date of Birth/Sex: 01-Apr-1931 (84 y.o. M) Treating RN: Ahmed Prima Primary Care Provider: Burman Freestone Other Clinician: Referring Provider: Burman Freestone Treating Provider/Extender: [name], HOYT Weeks in Treatment: 8 Active Problems ICD-10 Evaluated Encounter Code Description Active Date Today Diagnosis L89.154 Pressure ulcer of sacral region, stage 4 02/06/2018 No Yes L24.0 Irritant contact dermatitis due to detergents 02/06/2018 No Yes Inactive Problems Resolved Problems Electronic Signature(s) Signed: 04/04/2018 7:52:50 AM By: Worthy Keeler PA-C Entered By: Worthy Keeler on 04/03/2018 10:03:35 [name] ([id number]) -------------------------------------------------------------------------------- Progress Note Details Patient Name: [name] Date of Service: 04/03/2018 9:30 AM Medical Record Number: [id number] Patient Account Number: [id number] Date of Birth/Sex: 09-17-1931 (84 y.o. M) Treating RN: Ahmed Prima Primary Care Provider: Burman Freestone Other Clinician: Referring Provider: Burman Freestone Treating Provider/Extender: [name], HOYT Weeks in Treatment: 8 Subjective Chief Complaint Information obtained from Patient Sacral pressure ulcer and left shoulder rash History of Present Illness (HPI) 02/06/18 on evaluation today patient presents for initial evaluation and our clinic concerning an issue which began roughly 3 weeks ago when the patient fell in his home on the floor in his kitchen and laid him down this detergent for roughly 3 days. He had a pressure injury to the left shoulder. This unfortunately has caused him a lot of discomfort although it finally seems to be doing better if anything is really having a lot of itching right now. This appears potentially be a contact dermatitis issue. He also has a significant pressure injury to the sacrum at this time as well which is also showing fascia exposure right over the bone  but no evidence of bone exposure at this point which is good news. They have been using Santyl as well as Saline soaked gauze at this point in time. There does appear to be a lot of necrotic slough in the base of the wound. He does have a history of incontinence, myocardial infarction, and hypertension. He also is "borderline diabetic" hemoglobin A1c of 6.0. Currently he has some discomfort in the pressure site at the sacrum but fortunately nothing too significant this did require sharp  debridement today. 02/13/18 on evaluation today patient appears to be doing much better in regard to his sacral wound. He has been tolerating the dressing changes without complication with the Vashe. Fortunately there is no evidence of infection and though there is some Slough on the surface of the wound bed he has excellent granulation noted. Overall I'm pleased with how things have progressed in that regard. A glance at his shoulder as well and the rash seems to be someone improving in my pinion at this site as well. Overall I am pleased with what we're seeing and so is the family. 02/20/18 on evaluation today patient appears to be doing a little worse in regard to the sacral wound only in the fact that there is redness surrounding it has me somewhat concerned for infection. The drainage has also apparently been a little bit off color compared to normal according to family they did keep the dressing today that was removed to show me and I agree this seems to be a little bit different compared to what we have been seeing. Coupled with the redness I'm concerned he may be developing some cellulitis surrounding the wound bed. 02/27/18 on evaluation today patient presents for follow-up concerning his sacral ulcer. We have received the results back from his wound culture which shows unfortunately that the doxycycline will not be of benefit for him I am going to need to initiate treatment with something else in order to treat  the pseudomonas. Otherwise he does not seem to be having any significant pain although his daughter states there are sometimes when he states having pain. We continue to use the Vashe currently. 03/06/18 on evaluation today patient's sacral wound appears to be doing better in my opinion. He has been tolerating the dressing changes without complication. With that being said the silver nitrate has helped with the prominent area of hyper granulation at the 6 o'clock location we will likely need to repeat this again today. Nonetheless overall I am pleased with how things have improved over the last week. The erythema surrounding the wound seems to be greatly improved. 03/13/18 on evaluation today patient's wound actually does not appear to be terribly infected although she does continue to have erythema surrounding the wound bed especially on the left border. I'm still somewhat concerned about the fact that the oral antibiotics alone may not be completely treating his infection. I previously discussed may need to go for IV antibiotic therapy I'm concerned that may be the case. We will need to make a referral today for infectious disease. 03/20/18 on evaluation today the patient sacral wound actually appears to be doing fairly well in regard to granulation although he continues unfortunately to have it your theme is surrounding the periwound region. There's also some increased swelling at the 6 o'clock location which also has me somewhat concerned. With that being said he does have some discomfort but [name], [name]. ([id number]) nothing too significant at this point. He still has not heard from infectious disease his daughter and wife are both present during the office visit today they're going to check back with this again. We did get the information for them to call them today. 03/27/18 on evaluation today patient is seen concerning his ongoing sacral ulcer. He has been tolerating the dressing  changes without complication. With that being said he does present with evidence of bright green drainage noted on the dressing which again is something that I do often expect to see with a pseudomonas  infection. He continues to have your theme is surrounding the wound bed as well and again I'm not 100% convinced this is just pressure related. I did speak with Colletta [place] who is the nurse practitioner in [place] with infectious disease. I spoke with her actually yesterday concerning this patient. She is not 100% convinced that this is infected. She question whether the wound may just be colonized with Pseudomonas and not actually causing an active infection. I am really not thinking that the edema is associated with pressure alone and again overall I don't feel that your theme and is consistent with a pressure injury either as he's never had any contusions noted like a deep tissue injury on his heel which was new and I did visualize today this was on the left heel. Nonetheless she wanted to give this a little bit more time and thought it would be appropriate to start the Wound VAC at this point. 04/03/18 on evaluation today patient sacral ulcer actually appears to be doing fairly well at this point. He has been tolerating the dressing changes without complication. With that being said I'm very pleased with the progress that has been made in regard to his sacral wound over the past week I do not see as much in the way of erythema which is great news. Nonetheless he does have a small area of hyper granulation unfortunately. This is at roughly the 7 o'clock location and I think does need to be addressed so that this will heal more appropriately. Nonetheless I think we may be ready to go ahead and initiate therapy with the Wound VAC. Patient History Information obtained from Patient. Family History Cancer - Paternal Grandparents, Diabetes - Father, Heart Disease - Mother,Father, Stroke - Father, No  family history of Hypertension, Kidney Disease, Lung Disease, Seizures, Thyroid Problems, Tuberculosis. Social History Never smoker, Marital Status - Widowed, Alcohol Use - Never, Drug Use - No History, Caffeine Use - Daily. Medical History Hospitalization/Surgery History - 01/20/2018, [facility], [facility]. 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 Respiratory The patient has no complaints or symptoms. Cardiovascular The patient has no complaints or symptoms. Psychiatric The patient has no complaints or symptoms. [name], BATTAGLINI ([id number]) Objective Constitutional Well-nourished and well-hydrated in no acute distress. Vitals Time Taken: 9:46 AM, Height: 69 in, Weight: 155 lbs, BMI: 22.9, Temperature: 98.1 F, Pulse: 66 bpm, Respiratory Rate: 16 breaths/min, Blood Pressure: 136/79 mmHg. Respiratory normal breathing without difficulty. clear to auscultation bilaterally. Cardiovascular regular rate and rhythm with normal S1, S2. Psychiatric this patient is able to make decisions and demonstrates good insight into disease process. Alert and Oriented x 3. pleasant and cooperative. General Notes: At this point I'm gonna suggest that we go ahead and sharply debride the area of hyper granulation away so this will continue to heal more appropriately. Patient and his daughters are in agreement with the plan. Subsequently he tolerated this well without complication. Post debridement this look better he did have some bleeding that required some silver nitrate to completely cauterize the area but again he tolerated this well. I do think that we can begin treatment with the Wound VAC especially considering that I am going to place them on Augmentin based on the culture results today. Hopefully this should take care of the infection and again this was  a tissue culture. Integumentary (Hair, Skin) Wound #1 status is Open. Original cause of  wound was Pressure Injury. The wound is located on the Sacrum. The wound measures 2.9cm length x 2.7cm width x 1.3cm depth; 6.15cm^2 area and 7.995cm^3 volume. There is Fat Layer (Subcutaneous Tissue) Exposed exposed. There is no tunneling noted, however, there is undermining starting at 2:00 and ending at 5:00 with a maximum distance of 1.2cm. There is a large amount of purulent drainage noted. The wound margin is distinct with the outline attached to the wound base. There is small (1-33%) pink, hyper - granulation within the wound bed. There is a large (67-100%) amount of necrotic tissue within the wound bed including Adherent Slough. The periwound skin appearance exhibited: Excoriation, Erythema. The periwound skin appearance did not exhibit: Callus, Crepitus, Induration, Rash, Scarring, Dry/Scaly, Maceration, Atrophie Blanche, Cyanosis, Ecchymosis, Hemosiderin Staining, Mottled, Pallor, Rubor. The surrounding wound skin color is noted with erythema which is circumferential. Periwound temperature was noted as No Abnormality. The periwound has tenderness on palpation. Assessment Active Problems ICD-10 Pressure ulcer of sacral region, stage 4 Irritant contact dermatitis due to detergents Procedures [name], [name] ([id number]) Wound #1 Pre-procedure diagnosis of Wound #1 is a Pressure Ulcer located on the Sacrum . There was a Excisional Skin/Subcutaneous Tissue Debridement with a total area of 7.83 sq cm performed by STONE III, HOYT E., PA-C. With the following instrument(s): Curette to remove Viable and Non-Viable tissue/material. Material removed includes Subcutaneous Tissue, Slough, and Fibrin/Exudate after achieving pain control using Lidocaine 4% Topical Solution. No specimens were taken. A time out was conducted at 10:13, prior to the start of the procedure. A Minimum amount of bleeding was  controlled with Pressure. The procedure was tolerated well with a pain level of 0 throughout and a pain level of 0 following the procedure. Patient s Level of Consciousness post procedure was recorded as Awake and Alert. Post Debridement Measurements: 2.9cm length x 2.7cm width x 1.4cm depth; 8.61cm^3 volume. Post debridement Stage noted as Category/Stage III. Character of Wound/Ulcer Post Debridement requires further debridement. Post procedure Diagnosis Wound #1: Same as Pre-Procedure Plan Wound Cleansing: Wound #1 Sacrum: Clean wound with Normal Saline. Anesthetic (add to Medication List): Wound #1 Sacrum: Topical Lidocaine 4% cream applied to wound bed prior to debridement (In Clinic Only). Skin Barriers/Peri-Wound Care: Wound #1 Sacrum: Barrier cream Primary Wound Dressing: Wound #1 Sacrum: Other: - Dakins wet to dry gauze (until wound vac is placed on HHRN) Secondary Dressing: Wound #1 Sacrum: ABD pad - (until wound vac is placed on HHRN) Dry Gauze - (until wound vac is placed on HHRN) Other - Drawtex or equal (until wound vac is placed on HHRN) Dressing Change Frequency: Wound #1 Sacrum: Change dressing every day. Change Dressing Monday, Wednesday, Friday - NPWT when applied Follow-up Appointments: Wound #1 Sacrum: Return Appointment in 1 week. Off-Loading: Wound #1 Sacrum: Mattress - hospital bed Turn and reposition every 2 hours Other: - please make sure the pt is floating his heels while lying in bed Additional Orders / Instructions: Wound #1 Sacrum: Increase protein intake. Home Health: Wound #1 Sacrum: [place] [facility] - [facility] to place wound vac on 6/19 /19) Home Health Nurse may visit PRN to address patient s wound care needs. FACE TO FACE ENCOUNTER: MEDICARE and MEDICAID PATIENTS: I certify that this patient is under my care and that I had a face-to-face encounter that meets the physician face-to-face encounter requirements with this  patient on this date. The [name], [name] ([id number]) encounter with the patient was in whole or in part for  the following MEDICAL CONDITION: (primary reason for Home Healthcare) MEDICAL NECESSITY: I certify, that based on my findings, NURSING services are a medically necessary home health service. HOME BOUND STATUS: I certify that my clinical findings support that this patient is homebound (i.e., Due to illness or injury, pt requires aid of supportive devices such as crutches, cane, wheelchairs, walkers, the use of special transportation or the assistance of another person to leave their place of residence. There is a normal inability to leave the home and doing so requires considerable and taxing effort. Other absences are for medical reasons / religious services and are infrequent or of short duration when for other reasons). If current dressing causes regression in wound condition, may D/C ordered dressing product/s and apply Normal Saline Moist Dressing daily until next [place] / Other MD appointment. [place] of regression in wound condition at [phone]. Please direct any NON-WOUND related issues/requests for orders to patient's Primary Care Physician Negative Pressure Wound Therapy: Wound #1 Sacrum: Wound VAC settings at 125/130 mmHg continuous pressure. Use BLACK/GREEN foam to wound cavity. Use WHITE foam to fill any tunnel/s and/or undermining. Change VAC dressing 3 X WEEK. Change canister as indicated when full. Nurse may titrate settings and frequency of dressing changes as clinically indicated. - HHRN to place wound vac on 6/19 [facility] Nurse may d/c VAC for s/s of increased infection, significant wound regression, or uncontrolled drainage. [place] at [phone]. Number of foam/gauze pieces used in the dressing = Medications-please add to medication list.: Wound #1 Sacrum: Other: - TAC on back and shoulder The  following medication(s) was prescribed: lidocaine topical 4 % cream 1 1 cream topical was prescribed at facility Augmentin oral 875 mg-125 mg tablet 1 1 tablet oral taken 2 times a day for 10 days starting 04/03/2018 We will see were things stand in one weeks time we see him back for reevaluation. Patients in agreement with the plan. Hopefully with the Wound VAC therapy as well is getting him on this antibiotic things will improve dramatically. His daughters are hopeful for this as well obviously. Please see above for specific wound care orders. We will see patient for re-evaluation in 1 week(s) here in the clinic. If anything worsens or changes patient will contact our office for additional recommendations. Electronic Signature(s) Signed: 04/04/2018 7:52:50 AM By: Worthy Keeler PA-C Entered By: Worthy Keeler on 04/03/2018 17:24:53 Keahi, Mccarney [name] ([id number]) -------------------------------------------------------------------------------- ROS/PFSH Details Patient Name: [name] Date of Service: 04/03/2018 9:30 AM Medical Record Number: [id number] Patient Account Number: [id number] Date of Birth/Sex: 1931/02/18 (84 y.o. M) Treating RN: Ahmed Prima Primary Care Provider: Burman Freestone Other Clinician: Referring Provider: Burman Freestone Treating Provider/Extender: [name], HOYT Weeks in Treatment: 8 Information Obtained From Patient Wound History Do you currently have one or more open woundso Yes How many open wounds do you currently haveo 2 Approximately how long have you had your woundso 3 weeks Has your wound(s) ever healed and then re-openedo Yes Have you had any lab work done in the past montho Yes Have you tested positive for osteomyelitis (bone infection)o No Have you had any tests for circulation on your legso No Constitutional Symptoms (General Health) Complaints and Symptoms: Negative for: Fever; Chills Eyes Medical History: Positive for: Cataracts -  bilateral removal Negative for: Glaucoma; Optic Neuritis Ear/Nose/Mouth/Throat Medical History: Negative for: Chronic sinus problems/congestion; Middle ear problems Hematologic/Lymphatic Medical History: Negative for: Anemia; Hemophilia; Human Immunodeficiency Virus; Lymphedema; Sickle  Cell Disease Respiratory Complaints and Symptoms: No Complaints or Symptoms Medical History: Positive for: Asthma Negative for: Aspiration; Chronic Obstructive Pulmonary Disease (COPD); Pneumothorax; Sleep Apnea; Tuberculosis Cardiovascular Complaints and Symptoms: No Complaints or Symptoms Medical History: Positive for: Angina; Arrhythmia; Coronary Artery Disease; Hypertension; Myocardial Infarction - 2001 Negative for: Congestive Heart Failure; Deep Vein Thrombosis; Hypotension; Peripheral Arterial Disease; Peripheral KRUZ, CHIU ([id number]) Venous Disease; Phlebitis; Vasculitis Gastrointestinal Medical History: Negative for: Cirrhosis ; Colitis; Crohnos; Hepatitis A; Hepatitis B; Hepatitis C Endocrine Medical History: Negative for: Type I Diabetes; Type II Diabetes Past Medical History Notes: Borderline Genitourinary Medical History: Negative for: End Stage Renal Disease Immunological Medical History: Negative for: Lupus Erythematosus; Raynaudos; Scleroderma Integumentary (Skin) Medical History: Negative for: History of Burn; History of pressure wounds Musculoskeletal Medical History: Positive for: Osteoarthritis Negative for: Gout; Rheumatoid Arthritis; Osteomyelitis Neurologic Medical History: Positive for: Dementia; Neuropathy Negative for: Quadriplegia; Paraplegia; Seizure Disorder Oncologic Medical History: Negative for: Received Chemotherapy; Received Radiation Past Medical History Notes: Melanoma on back Psychiatric Complaints and Symptoms: No Complaints or Symptoms Medical History: Negative for: Anorexia/bulimia; Confinement Anxiety HBO Extended History  Items Eyes: Cataracts KELEN, LAURA ([id number]) Immunizations Pneumococcal Vaccine: Received Pneumococcal Vaccination: Yes Implantable Devices Hospitalization / Surgery History Name of Hospital Purpose of Hospitalization/Surgery Date [facility] 01/20/2018 Family and Social History Cancer: Yes - Paternal Grandparents; Diabetes: Yes - Father; Heart Disease: Yes - Mother,Father; Hypertension: No; Kidney Disease: No; Lung Disease: No; Seizures: No; Stroke: Yes - Father; Thyroid Problems: No; Tuberculosis: No; Never smoker; Marital Status - Widowed; Alcohol Use: Never; Drug Use: No History; Caffeine Use: Daily; Financial Concerns: No; Food, Clothing or Shelter Needs: No; Support System Lacking: No; Transportation Concerns: No; Advanced Directives: Yes (Not Provided); Patient does not want information on Advanced Directives; Do not resuscitate: Yes (Not Provided); Living Will: Yes (Not Provided); Medical Power of Attorney: Yes - Eligh Rybacki (Not Provided) Physician Affirmation I have reviewed and agree with the above information. Electronic Signature(s) Signed: 04/04/2018 7:52:50 AM By: Worthy Keeler PA-C Signed: 04/05/2018 4:29:22 PM By: Alric Quan Entered By: Worthy Keeler on 04/03/2018 17:21:58 [name] ([id number]) -------------------------------------------------------------------------------- SuperBill Details Patient Name: [name] Date of Service: 04/03/2018 Medical Record Number: [id number] Patient Account Number: [id number] Date of Birth/Sex: 09-04-1931 (84 y.o. M) Treating RN: Ahmed Prima Primary Care Provider: Burman Freestone Other Clinician: Referring Provider: Burman Freestone Treating Provider/Extender: [name], HOYT Weeks in Treatment: 8 Diagnosis Coding ICD-10 Codes Code Description L89.154 Pressure ulcer of sacral region, stage 4 L24.0 Irritant contact dermatitis due to detergents Facility Procedures CPT4 Code:  [id number] Description: 62263 - DEB SUBQ TISSUE 20 SQ CM/< ICD-10 Diagnosis Description L89.154 Pressure ulcer of sacral region, stage 4 Modifier: Quantity: 1 Physician Procedures CPT4 Code: 3354562 Description: 56389 - WC PHYS SUBQ TISS 20 SQ CM ICD-10 Diagnosis Description L89.154 Pressure ulcer of sacral region, stage 4 Modifier: Quantity: 1 Electronic Signature(s) Signed: 04/04/2018 7:52:50 AM By: Worthy Keeler PA-C Entered By: Worthy Keeler on 04/03/2018 17:27:39

## 2018-04-09 ENCOUNTER — Other Ambulatory Visit: Payer: Medicare Other

## 2018-04-10 ENCOUNTER — Encounter: Payer: Medicare Other | Admitting: Physician Assistant

## 2018-04-10 DIAGNOSIS — L89154 Pressure ulcer of sacral region, stage 4: Secondary | ICD-10-CM | POA: Diagnosis not present

## 2018-04-12 ENCOUNTER — Other Ambulatory Visit: Payer: Medicare Other | Admitting: *Deleted

## 2018-04-12 DIAGNOSIS — E785 Hyperlipidemia, unspecified: Secondary | ICD-10-CM

## 2018-04-12 LAB — HEPATIC FUNCTION PANEL
ALT: 21 IU/L (ref 0–44)
AST: 22 IU/L (ref 0–40)
Albumin: 3.4 g/dL — ABNORMAL LOW (ref 3.5–4.7)
Alkaline Phosphatase: 95 IU/L (ref 39–117)
BILIRUBIN TOTAL: 0.3 mg/dL (ref 0.0–1.2)
Bilirubin, Direct: 0.12 mg/dL (ref 0.00–0.40)
Total Protein: 6.6 g/dL (ref 6.0–8.5)

## 2018-04-12 LAB — LIPID PANEL
CHOL/HDL RATIO: 2.9 ratio (ref 0.0–5.0)
Cholesterol, Total: 126 mg/dL (ref 100–199)
HDL: 43 mg/dL (ref >39)
LDL CALC: 70 mg/dL (ref 0–99)
TRIGLYCERIDES: 65 mg/dL (ref 0–149)
VLDL Cholesterol Cal: 13 mg/dL (ref 5–40)

## 2018-04-12 NOTE — Progress Notes (Signed)
RYLEE, NUZUM ([id number]) Visit Report for 04/10/2018 Chief Complaint Document Details Patient Name: [name], [name]. Date of Service: 04/10/2018 8:45 AM Medical Record Number: [id number] Patient Account Number: [id number] Date of Birth/Sex: [birth year] (84 y.o. M) Treating RN: Ahmed Prima Primary Care Provider: Burman Freestone Other Clinician: Referring Provider: Burman Freestone Treating Provider/Extender: [name], HOYT Weeks in Treatment: 9 Information Obtained from: Patient Chief Complaint Sacral pressure ulcer and left shoulder rash Electronic Signature(s) Signed: 04/11/2018 12:21:38 AM By: Worthy Keeler PA-C Entered By: Worthy Keeler on 04/10/2018 09:05:19 [name] ([id number]) -------------------------------------------------------------------------------- Debridement Details Patient Name: [name] Date of Service: 04/10/2018 8:45 AM Medical Record Number: [id number] Patient Account Number: [id number] Date of Birth/Sex: [birth year] (84 y.o. M) Treating RN: Montey Hora Primary Care Provider: Burman Freestone Other Clinician: Referring Provider: Burman Freestone Treating Provider/Extender: [name], HOYT Weeks in Treatment: 9 Debridement Performed for Wound #1 Sacrum Assessment: Performed By: Physician STONE III, HOYT E., PA-C Debridement Type: Debridement Pre-procedure Verification/Time Yes - 09:29 Out Taken: Start Time: 09:29 Pain Control: Lidocaine 4% Topical Solution Total Area Debrided (L x W): 3.5 (cm) x 3 (cm) = 10.5 (cm) Tissue and other material Viable, Non-Viable, Slough, Subcutaneous, Slough debrided: Level: Skin/Subcutaneous Tissue Debridement Description: Excisional Instrument: Curette Bleeding: Minimum Hemostasis Achieved: Pressure End Time: 09:32 Procedural Pain: 0 Post Procedural Pain: 0 Response to Treatment: Procedure was tolerated well Level of Consciousness: Awake and Alert Post Procedure Vitals: Temperature:  98.4 Pulse: 66 Respiratory Rate: 16 Blood Pressure: Systolic Blood Pressure: 673 Diastolic Blood Pressure: 58 Post Debridement Measurements of Total Wound Length: (cm) 3.5 Stage: Category/Stage III Width: (cm) 3 Depth: (cm) 1.5 Volume: (cm) 12.37 Character of Wound/Ulcer Post Improved Debridement: Post Procedure Diagnosis Same as Pre-procedure Electronic Signature(s) Signed: 04/10/2018 5:42:13 PM By: Montey Hora Signed: 04/11/2018 12:21:38 AM By: Worthy Keeler PA-C Entered By: Montey Hora on 04/10/2018 09:32:30 [name], [name] ([name], [name] ([id number]) -------------------------------------------------------------------------------- HPI Details Patient Name: [name], [name]. Date of Service: 04/10/2018 8:45 AM Medical Record Number: [id number] Patient Account Number: [id number] Date of Birth/Sex: 1931-03-25 (84 y.o. M) Treating RN: Ahmed Prima Primary Care Provider: Burman Freestone Other Clinician: Referring Provider: Burman Freestone Treating Provider/Extender: [name], HOYT Weeks in Treatment: 9 History of Present Illness HPI Description: 02/06/18 on evaluation today patient presents for initial evaluation and our clinic concerning an issue which began roughly 3 weeks ago when the patient fell in his home on the floor in his kitchen and laid him down this detergent for roughly 3 days. He had a pressure injury to the left shoulder. This unfortunately has caused him a lot of discomfort although it finally seems to be doing better if anything is really having a lot of itching right now. This appears potentially be a contact dermatitis issue. He also has a significant pressure injury to the sacrum at this time as well which is also showing fascia exposure right over the bone but no evidence of bone exposure at this point which is good news. They have been using Santyl as well as Saline soaked gauze at this point in time. There does appear to be a  lot of necrotic slough in the base of the wound. He does have a history of incontinence, myocardial infarction, and hypertension. He also is "borderline diabetic" hemoglobin A1c of 6.0. Currently he has some discomfort in the pressure site at the sacrum but fortunately nothing too significant this did require sharp debridement today. 02/13/18 on evaluation today patient appears  to be doing much better in regard to his sacral wound. He has been tolerating the dressing changes without complication with the Vashe. Fortunately there is no evidence of infection and though there is some Slough on the surface of the wound bed he has excellent granulation noted. Overall I'm pleased with how things have progressed in that regard. A glance at his shoulder as well and the rash seems to be someone improving in my pinion at this site as well. Overall I am pleased with what we're seeing and so is the family. 02/20/18 on evaluation today patient appears to be doing a little worse in regard to the sacral wound only in the fact that there is redness surrounding it has me somewhat concerned for infection. The drainage has also apparently been a little bit off color compared to normal according to family they did keep the dressing today that was removed to show me and I agree this seems to be a little bit different compared to what we have been seeing. Coupled with the redness I'm concerned he may be developing some cellulitis surrounding the wound bed. 02/27/18 on evaluation today patient presents for follow-up concerning his sacral ulcer. We have received the results back from his wound culture which shows unfortunately that the doxycycline will not be of benefit for him I am going to need to initiate treatment with something else in order to treat the pseudomonas. Otherwise he does not seem to be having any significant pain although his daughter states there are sometimes when he states having pain. We continue to use  the Vashe currently. 03/06/18 on evaluation today patient's sacral wound appears to be doing better in my opinion. He has been tolerating the dressing changes without complication. With that being said the silver nitrate has helped with the prominent area of hyper granulation at the 6 o'clock location we will likely need to repeat this again today. Nonetheless overall I am pleased with how things have improved over the last week. The erythema surrounding the wound seems to be greatly improved. 03/13/18 on evaluation today patient's wound actually does not appear to be terribly infected although she does continue to have erythema surrounding the wound bed especially on the left border. I'm still somewhat concerned about the fact that the oral antibiotics alone may not be completely treating his infection. I previously discussed may need to go for IV antibiotic therapy I'm concerned that may be the case. We will need to make a referral today for infectious disease. 03/20/18 on evaluation today the patient sacral wound actually appears to be doing fairly well in regard to granulation although he continues unfortunately to have it your theme is surrounding the periwound region. There's also some increased swelling at the 6 o'clock location which also has me somewhat concerned. With that being said he does have some discomfort but nothing too significant at this point. He still has not heard from infectious disease his daughter and wife are both present during the office visit today they're going to check back with this again. We did get the information for them to call them today. 03/27/18 on evaluation today patient is seen concerning his ongoing sacral ulcer. He has been tolerating the dressing changes without complication. With that being said he does present with evidence of bright green drainage noted on the dressing which again is something that I do often expect to see with a pseudomonas infection. He  continues to have your theme is SALLY, REIMERS ([id number])  surrounding the wound bed as well and again I'm not 100% convinced this is just pressure related. I did speak with Colletta Maryland who is the nurse practitioner in [place] with infectious disease. I spoke with her actually yesterday concerning this patient. She is not 100% convinced that this is infected. She question whether the wound may just be colonized with Pseudomonas and not actually causing an active infection. I am really not thinking that the edema is associated with pressure alone and again overall I don't feel that your theme and is consistent with a pressure injury either as he's never had any contusions noted like a deep tissue injury on his heel which was new and I did visualize today this was on the left heel. Nonetheless she wanted to give this a little bit more time and thought it would be appropriate to start the Wound VAC at this point. 04/03/18 on evaluation today patient sacral ulcer actually appears to be doing fairly well at this point. He has been tolerating the dressing changes without complication. With that being said I'm very pleased with the progress that has been made in regard to his sacral wound over the past week I do not see as much in the way of erythema which is great news. Nonetheless he does have a small area of hyper granulation unfortunately. This is at roughly the 7 o'clock location and I think does need to be addressed so that this will heal more appropriately. Nonetheless I think we may be ready to go ahead and initiate therapy with the Wound VAC. 04/10/18 on evaluation today patient appears to be doing excellent in regard to his sacral ulcer. The show signs of great improvement in overall I'm very pleased with how things look. He has been tolerating the dressing changes without complication. Specifically this is the Wound VAC. He also seems to be doing well with the antibiotic there is decreased  your theme and redness surrounding the sacral area at this point in the wound has filled in quite significantly. Electronic Signature(s) Signed: 04/11/2018 12:21:38 AM By: Worthy Keeler PA-C Entered By: Worthy Keeler on 04/10/2018 09:36:38 [name] ([id number]) -------------------------------------------------------------------------------- Physical Exam Details Patient Name: ISHMEAL, RORIE Date of Service: 04/10/2018 8:45 AM Medical Record Number: [id number] Patient Account Number: [id number] Date of Birth/Sex: 12-03-1930 (84 y.o. M) Treating RN: Ahmed Prima Primary Care Provider: Burman Freestone Other Clinician: Referring Provider: Burman Freestone Treating Provider/Extender: STONE III, HOYT Weeks in Treatment: 9 Constitutional Well-nourished and well-hydrated in no acute distress. Respiratory normal breathing without difficulty. Psychiatric this patient is able to make decisions and demonstrates good insight into disease process. Alert and Oriented x 3. pleasant and cooperative. Notes Patient did have some surface slough noted from over the wound but fortunately he seems to show great granulation and this is filling in quite nicely. I believe that he is tolerating the Wound VAC in an excellent fashion at this point. I think the black foam is still appropriate. Electronic Signature(s) Signed: 04/11/2018 12:21:38 AM By: Worthy Keeler PA-C Entered By: Worthy Keeler on 04/10/2018 09:37:24 [name] ([id number]) -------------------------------------------------------------------------------- Physician Orders Details Patient Name: [name], [name] Date of Service: 04/10/2018 8:45 AM Medical Record Number: [id number] Patient Account Number: [id number] Date of Birth/Sex: Jan 28, 1931 (84 y.o. M) Treating RN: Montey Hora Primary Care Provider: Burman Freestone Other Clinician: Referring Provider: Burman Freestone Treating Provider/Extender: [name],  HOYT Weeks in Treatment: 9 Verbal / Phone Orders: No Diagnosis Coding ICD-10 Coding Code Description  L89.154 Pressure ulcer of sacral region, stage 4 L24.0 Irritant contact dermatitis due to detergents Wound Cleansing Wound #1 Sacrum o Clean wound with Normal Saline. Anesthetic (add to Medication List) Wound #1 Sacrum o Topical Lidocaine 4% cream applied to wound bed prior to debridement (In Clinic Only). Skin Barriers/Peri-Wound Care Wound #1 Sacrum o Skin Prep Dressing Change Frequency Wound #1 Sacrum o Change Dressing Monday, Wednesday, Friday Follow-up Appointments Wound #1 Sacrum o Return Appointment in 1 week. Off-Loading Wound #1 Sacrum o Mattress - hospital bed o Turn and reposition every 2 hours o Other: - please make sure the pt is floating his heels while lying in bed Additional Orders / Instructions Wound #1 Sacrum o Increase protein intake. Home Health Wound #1 [place] Visits - [facility] Nurse may visit PRN to address patientos wound care needs. o FACE TO FACE ENCOUNTER: MEDICARE and MEDICAID PATIENTS: I certify that this patient is under my care and that I had a face-to-face encounter that meets the physician face-to-face encounter requirements with this [name], [name] ([id number]) patient on this date. The encounter with the patient was in whole or in part for the following MEDICAL CONDITION: (primary reason for [place]) MEDICAL NECESSITY: I certify, that based on my findings, NURSING services are a medically necessary home health service. HOME BOUND STATUS: I certify that my clinical findings support that this patient is homebound (i.e., Due to illness or injury, pt requires aid of supportive devices such as crutches, cane, wheelchairs, walkers, the use of special transportation or the assistance of another person to leave their place of residence. There is a normal inability to leave the  home and doing so requires considerable and taxing effort. Other absences are for medical reasons / religious services and are infrequent or of short duration when for other reasons). o If current dressing causes regression in wound condition, may D/C ordered dressing product/s and apply Normal Saline Moist Dressing daily until next [place] / Other MD appointment. [place] of regression in wound condition at [phone]. o Please direct any NON-WOUND related issues/requests for orders to patient's Primary Care Physician Negative Pressure Wound Therapy Wound #1 Sacrum o Wound VAC settings at 125/130 mmHg continuous pressure. Use BLACK/GREEN foam to wound cavity. Use WHITE foam to fill any tunnel/s and/or undermining. Change VAC dressing 3 X WEEK. Change canister as indicated when full. Nurse may titrate settings and frequency of dressing changes as clinically indicated. o Home Health Nurse may d/c VAC for s/s of increased infection, significant wound regression, or uncontrolled drainage. [place] at [phone]. o Number of foam/gauze pieces used in the dressing = Electronic Signature(s) Signed: 04/10/2018 5:42:13 PM By: Montey Hora Signed: 04/11/2018 12:21:38 AM By: Worthy Keeler PA-C Entered By: Montey Hora on 04/10/2018 09:33:44 Colter, [place] [name] ([id number]) -------------------------------------------------------------------------------- Problem List Details Patient Name: [name], [name]. Date of Service: 04/10/2018 8:45 AM Medical Record Number: [id number] Patient Account Number: [id number] Date of Birth/Sex: 1930/12/27 (84 y.o. M) Treating RN: Ahmed Prima Primary Care Provider: Burman Freestone Other Clinician: Referring Provider: Burman Freestone Treating Provider/Extender: [name], HOYT Weeks in Treatment: 9 Active Problems ICD-10 Evaluated Encounter Code Description Active Date Today Diagnosis L89.154  Pressure ulcer of sacral region, stage 4 02/06/2018 No Yes L24.0 Irritant contact dermatitis due to detergents 02/06/2018 No Yes Inactive Problems Resolved Problems Electronic Signature(s) Signed: 04/11/2018 12:21:38 AM By: Worthy Keeler PA-C Entered By: Worthy Keeler on 04/10/2018  09:05:13 [name], [name] ([id number]) -------------------------------------------------------------------------------- Progress Note Details Patient Name: [name], [name]. Date of Service: 04/10/2018 8:45 AM Medical Record Number: [id number] Patient Account Number: [id number] Date of Birth/Sex: 1931-05-28 (84 y.o. M) Treating RN: Ahmed Prima Primary Care Provider: Burman Freestone Other Clinician: Referring Provider: Burman Freestone Treating Provider/Extender: [name], HOYT Weeks in Treatment: 9 Subjective Chief Complaint Information obtained from Patient Sacral pressure ulcer and left shoulder rash History of Present Illness (HPI) 02/06/18 on evaluation today patient presents for initial evaluation and our clinic concerning an issue which began roughly 3 weeks ago when the patient fell in his home on the floor in his kitchen and laid him down this detergent for roughly 3 days. He had a pressure injury to the left shoulder. This unfortunately has caused him a lot of discomfort although it finally seems to be doing better if anything is really having a lot of itching right now. This appears potentially be a contact dermatitis issue. He also has a significant pressure injury to the sacrum at this time as well which is also showing fascia exposure right over the bone but no evidence of bone exposure at this point which is good news. They have been using Santyl as well as Saline soaked gauze at this point in time. There does appear to be a lot of necrotic slough in the base of the wound. He does have a history of incontinence, myocardial infarction, and hypertension. He also is "borderline diabetic"  hemoglobin A1c of 6.0. Currently he has some discomfort in the pressure site at the sacrum but fortunately nothing too significant this did require sharp debridement today. 02/13/18 on evaluation today patient appears to be doing much better in regard to his sacral wound. He has been tolerating the dressing changes without complication with the Vashe. Fortunately there is no evidence of infection and though there is some Slough on the surface of the wound bed he has excellent granulation noted. Overall I'm pleased with how things have progressed in that regard. A glance at his shoulder as well and the rash seems to be someone improving in my pinion at this site as well. Overall I am pleased with what we're seeing and so is the family. 02/20/18 on evaluation today patient appears to be doing a little worse in regard to the sacral wound only in the fact that there is redness surrounding it has me somewhat concerned for infection. The drainage has also apparently been a little bit off color compared to normal according to family they did keep the dressing today that was removed to show me and I agree this seems to be a little bit different compared to what we have been seeing. Coupled with the redness I'm concerned he may be developing some cellulitis surrounding the wound bed. 02/27/18 on evaluation today patient presents for follow-up concerning his sacral ulcer. We have received the results back from his wound culture which shows unfortunately that the doxycycline will not be of benefit for him I am going to need to initiate treatment with something else in order to treat the pseudomonas. Otherwise he does not seem to be having any significant pain although his daughter states there are sometimes when he states having pain. We continue to use the Vashe currently. 03/06/18 on evaluation today patient's sacral wound appears to be doing better in my opinion. He has been tolerating the dressing changes  without complication. With that being said the silver nitrate has helped with the prominent area of  hyper granulation at the 6 o'clock location we will likely need to repeat this again today. Nonetheless overall I am pleased with how things have improved over the last week. The erythema surrounding the wound seems to be greatly improved. 03/13/18 on evaluation today patient's wound actually does not appear to be terribly infected although she does continue to have erythema surrounding the wound bed especially on the left border. I'm still somewhat concerned about the fact that the oral antibiotics alone may not be completely treating his infection. I previously discussed may need to go for IV antibiotic therapy I'm concerned that may be the case. We will need to make a referral today for infectious disease. 03/20/18 on evaluation today the patient sacral wound actually appears to be doing fairly well in regard to granulation although he continues unfortunately to have it your theme is surrounding the periwound region. There's also some increased swelling at the 6 o'clock location which also has me somewhat concerned. With that being said he does have some discomfort but [name], [name]. ([id number]) nothing too significant at this point. He still has not heard from infectious disease his daughter and wife are both present during the office visit today they're going to check back with this again. We did get the information for them to call them today. 03/27/18 on evaluation today patient is seen concerning his ongoing sacral ulcer. He has been tolerating the dressing changes without complication. With that being said he does present with evidence of bright green drainage noted on the dressing which again is something that I do often expect to see with a pseudomonas infection. He continues to have your theme is surrounding the wound bed as well and again I'm not 100% convinced this is just pressure  related. I did speak with Colletta [place] who is the nurse practitioner in [place] with infectious disease. I spoke with her actually yesterday concerning this patient. She is not 100% convinced that this is infected. She question whether the wound may just be colonized with Pseudomonas and not actually causing an active infection. I am really not thinking that the edema is associated with pressure alone and again overall I don't feel that your theme and is consistent with a pressure injury either as he's never had any contusions noted like a deep tissue injury on his heel which was new and I did visualize today this was on the left heel. Nonetheless she wanted to give this a little bit more time and thought it would be appropriate to start the Wound VAC at this point. 04/03/18 on evaluation today patient sacral ulcer actually appears to be doing fairly well at this point. He has been tolerating the dressing changes without complication. With that being said I'm very pleased with the progress that has been made in regard to his sacral wound over the past week I do not see as much in the way of erythema which is great news. Nonetheless he does have a small area of hyper granulation unfortunately. This is at roughly the 7 o'clock location and I think does need to be addressed so that this will heal more appropriately. Nonetheless I think we may be ready to go ahead and initiate therapy with the Wound VAC. 04/10/18 on evaluation today patient appears to be doing excellent in regard to his sacral ulcer. The show signs of great improvement in overall I'm very pleased with how things look. He has been tolerating the dressing changes without complication. Specifically this is the Wound  VAC. He also seems to be doing well with the antibiotic there is decreased your theme and redness surrounding the sacral area at this point in the wound has filled in quite significantly. Patient History Information obtained  from Patient. Family History Cancer - Paternal Grandparents, Diabetes - Father, Heart Disease - Mother,Father, Stroke - Father, No family history of Hypertension, Kidney Disease, Lung Disease, Seizures, Thyroid Problems, Tuberculosis. Social History Never smoker, Marital Status - Widowed, Alcohol Use - Never, Drug Use - No History, Caffeine Use - Daily. Medical History Hospitalization/Surgery History - 01/20/2018, ARMS, Fall. 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 Respiratory The patient has no complaints or symptoms. Cardiovascular The patient has no complaints or symptoms. Psychiatric The patient has no complaints or symptoms. [name], [name] ([id number]) Objective Constitutional Well-nourished and well-hydrated in no acute distress. Vitals Time Taken: 9:07 AM, Height: 69 in, Weight: 155 lbs, BMI: 22.9, Temperature: 98.4 F, Pulse: 66 bpm, Respiratory Rate: 18 breaths/min, Blood Pressure: 117/58 mmHg. Respiratory normal breathing without difficulty. Psychiatric this patient is able to make decisions and demonstrates good insight into disease process. Alert and Oriented x 3. pleasant and cooperative. General Notes: Patient did have some surface slough noted from over the wound but fortunately he seems to show great granulation and this is filling in quite nicely. I believe that he is tolerating the Wound VAC in an excellent fashion at this point. I think the black foam is still appropriate. Integumentary (Hair, Skin) Wound #1 status is Open. Original cause of wound was Pressure Injury. The wound is located on the Sacrum. The wound measures 3.5cm length x 3cm width x 1.3cm depth; 8.247cm^2 area and 10.721cm^3 volume. There is Fat Layer (Subcutaneous Tissue) Exposed exposed. There is a large amount of purulent drainage  noted. The wound margin is distinct with the outline attached to the wound base. There is small (1-33%) pink, hyper - granulation within the wound bed. There is a large (67-100%) amount of necrotic tissue within the wound bed including Adherent Slough. The periwound skin appearance exhibited: Excoriation, Erythema. The periwound skin appearance did not exhibit: Callus, Crepitus, Induration, Rash, Scarring, Dry/Scaly, Maceration, Atrophie Blanche, Cyanosis, Ecchymosis, Hemosiderin Staining, Mottled, Pallor, Rubor. The surrounding wound skin color is noted with erythema which is circumferential. Periwound temperature was noted as No Abnormality. The periwound has tenderness on palpation. Assessment Active Problems ICD-10 Pressure ulcer of sacral region, stage 4 Irritant contact dermatitis due to detergents Procedures Wound #1 Pre-procedure diagnosis of Wound #1 is a Pressure Ulcer located on the Sacrum . There was a Excisional [name], [name] ([id number]) Skin/Subcutaneous Tissue Debridement with a total area of 10.5 sq cm performed by STONE III, HOYT E., PA-C. With the following instrument(s): Curette to remove Viable and Non-Viable tissue/material. Material removed includes Subcutaneous Tissue and Slough and after achieving pain control using Lidocaine 4% Topical Solution. No specimens were taken. A time out was conducted at 09:29, prior to the start of the procedure. A Minimum amount of bleeding was controlled with Pressure. The procedure was tolerated well with a pain level of 0 throughout and a pain level of 0 following the procedure. Patient s Level of Consciousness post procedure was recorded as Awake and Alert. Post Debridement Measurements: 3.5cm length x 3cm width x 1.5cm depth; 12.37cm^3 volume. Post debridement Stage noted as Category/Stage III. Character of Wound/Ulcer Post Debridement is improved. Post procedure Diagnosis Wound #  1: Same as Pre-Procedure Plan Wound  Cleansing: Wound #1 Sacrum: Clean wound with Normal Saline. Anesthetic (add to Medication List): Wound #1 Sacrum: Topical Lidocaine 4% cream applied to wound bed prior to debridement (In Clinic Only). Skin Barriers/Peri-Wound Care: Wound #1 Sacrum: Skin Prep Dressing Change Frequency: Wound #1 Sacrum: Change Dressing Monday, Wednesday, Friday Follow-up Appointments: Wound #1 Sacrum: Return Appointment in 1 week. Off-Loading: Wound #1 Sacrum: Mattress - hospital bed Turn and reposition every 2 hours Other: - please make sure the pt is floating his heels while lying in bed Additional Orders / Instructions: Wound #1 Sacrum: Increase protein intake. Home Health: Wound #1 Sacrum: Continue Home Health Visits - [place] Nurse may visit PRN to address patient s wound care needs. FACE TO FACE ENCOUNTER: MEDICARE and MEDICAID PATIENTS: I certify that this patient is under my care and that I had a face-to-face encounter that meets the physician face-to-face encounter requirements with this patient on this date. The encounter with the patient was in whole or in part for the following MEDICAL CONDITION: (primary reason for [place]) MEDICAL NECESSITY: I certify, that based on my findings, NURSING services are a medically necessary home health service. HOME BOUND STATUS: I certify that my clinical findings support that this patient is homebound (i.e., Due to illness or injury, pt requires aid of supportive devices such as crutches, cane, wheelchairs, walkers, the use of special transportation or the assistance of another person to leave their place of residence. There is a normal inability to leave the home and doing so requires considerable and taxing effort. Other absences are for medical reasons / religious services and are infrequent or of short duration when for other reasons). If current dressing causes regression in wound condition, may D/C ordered dressing product/s  and apply Normal Saline Moist Dressing daily until next [place] / Other MD appointment. [place] of regression in wound condition at [phone]. Please direct any NON-WOUND related issues/requests for orders to patient's Primary Care Physician Negative Pressure Wound Therapy: Wound #1 Sacrum: KRISTOFF, COONRADT ([id number]) Wound VAC settings at 125/130 mmHg continuous pressure. Use BLACK/GREEN foam to wound cavity. Use WHITE foam to fill any tunnel/s and/or undermining. Change VAC dressing 3 X WEEK. Change canister as indicated when full. Nurse may titrate settings and frequency of dressing changes as clinically indicated. Home Health Nurse may d/c VAC for s/s of increased infection, significant wound regression, or uncontrolled drainage. Eugenio Saenz at [phone]. Number of foam/gauze pieces used in the dressing = I'm gonna recommend that we actually continue with the Current wound care measures for the next week. Patient's family is in agreement with plan. We will subsequently see were things stand at follow-up at that point. Please see above for specific wound care orders. We will see patient for re-evaluation in 1 week(s) here in the clinic. If anything worsens or changes patient will contact our office for additional recommendations. Electronic Signature(s) Signed: 04/11/2018 12:21:38 AM By: Worthy Keeler PA-C Entered By: Worthy Keeler on 04/10/2018 09:37:39 Nahzir, Pohle [name] ([id number]) -------------------------------------------------------------------------------- ROS/PFSH Details Patient Name: [name] Date of Service: 04/10/2018 8:45 AM Medical Record Number: [id number] Patient Account Number: [id number] Date of Birth/Sex: 27-Jan-1931 (84 y.o. M) Treating RN: Ahmed Prima Primary Care Provider: Burman Freestone Other Clinician: Referring Provider: Burman Freestone Treating Provider/Extender: [name], HOYT Weeks  in Treatment: 9 Information Obtained From Patient Wound History Do you currently have one or more open woundso Yes  How many open wounds do you currently haveo 2 Approximately how long have you had your woundso 3 weeks Has your wound(s) ever healed and then re-openedo Yes Have you had any lab work done in the past montho Yes Have you tested positive for osteomyelitis (bone infection)o No Have you had any tests for circulation on your legso No Constitutional Symptoms (General Health) Complaints and Symptoms: Negative for: Fever; Chills Eyes Medical History: Positive for: Cataracts - bilateral removal Negative for: Glaucoma; Optic Neuritis Ear/Nose/Mouth/Throat Medical History: Negative for: Chronic sinus problems/congestion; Middle ear problems Hematologic/Lymphatic Medical History: Negative for: Anemia; Hemophilia; Human Immunodeficiency Virus; Lymphedema; Sickle Cell Disease Respiratory Complaints and Symptoms: No Complaints or Symptoms Medical History: Positive for: Asthma Negative for: Aspiration; Chronic Obstructive Pulmonary Disease (COPD); Pneumothorax; Sleep Apnea; Tuberculosis Cardiovascular Complaints and Symptoms: No Complaints or Symptoms Medical History: Positive for: Angina; Arrhythmia; Coronary Artery Disease; Hypertension; Myocardial Infarction - 2001 Negative for: Congestive Heart Failure; Deep Vein Thrombosis; Hypotension; Peripheral Arterial Disease; Peripheral [name], HIDROGO ([id number]) Venous Disease; Phlebitis; Vasculitis Gastrointestinal Medical History: Negative for: Cirrhosis ; Colitis; Crohnos; Hepatitis A; Hepatitis B; Hepatitis C Endocrine Medical History: Negative for: Type I Diabetes; Type II Diabetes Past Medical History Notes: Borderline Genitourinary Medical History: Negative for: End Stage Renal Disease Immunological Medical History: Negative for: Lupus Erythematosus; Raynaudos; Scleroderma Integumentary (Skin) Medical  History: Negative for: History of Burn; History of pressure wounds Musculoskeletal Medical History: Positive for: Osteoarthritis Negative for: Gout; Rheumatoid Arthritis; Osteomyelitis Neurologic Medical History: Positive for: Dementia; Neuropathy Negative for: Quadriplegia; Paraplegia; Seizure Disorder Oncologic Medical History: Negative for: Received Chemotherapy; Received Radiation Past Medical History Notes: Melanoma on back Psychiatric Complaints and Symptoms: No Complaints or Symptoms Medical History: Negative for: Anorexia/bulimia; Confinement Anxiety HBO Extended History Items Eyes: Cataracts LES, LONGMORE ([id number]) Immunizations Pneumococcal Vaccine: Received Pneumococcal Vaccination: Yes Implantable Devices Hospitalization / Surgery History Name of Hospital Purpose of Hospitalization/Surgery Date [facility] 01/20/2018 Family and Social History Cancer: Yes - Paternal Grandparents; Diabetes: Yes - Father; Heart Disease: Yes - Mother,Father; Hypertension: No; Kidney Disease: No; Lung Disease: No; Seizures: No; Stroke: Yes - Father; Thyroid Problems: No; Tuberculosis: No; Never smoker; Marital Status - Widowed; Alcohol Use: Never; Drug Use: No History; Caffeine Use: Daily; Financial Concerns: No; Food, Clothing or Shelter Needs: No; Support System Lacking: No; Transportation Concerns: No; Advanced Directives: Yes (Not Provided); Patient does not want information on Advanced Directives; Do not resuscitate: Yes (Not Provided); Living Will: Yes (Not Provided); Medical Power of Attorney: Yes - Mohd. Derflinger (Not Provided) Physician Affirmation I have reviewed and agree with the above information. Electronic Signature(s) Signed: 04/11/2018 12:21:38 AM By: Worthy Keeler PA-C Signed: 04/11/2018 4:02:05 PM By: Alric Quan Entered By: Worthy Keeler on 04/10/2018 09:36:57 Sprankle, [name]  ([id number]) -------------------------------------------------------------------------------- SuperBill Details Patient Name: [name] Date of Service: 04/10/2018 Medical Record Number: [id number] Patient Account Number: [id number] Date of Birth/Sex: 05/25/1931 (84 y.o. M) Treating RN: Ahmed Prima Primary Care Provider: Burman Freestone Other Clinician: Referring Provider: Burman Freestone Treating Provider/Extender: [name], HOYT Weeks in Treatment: 9 Diagnosis Coding ICD-10 Codes Code Description L89.154 Pressure ulcer of sacral region, stage 4 L24.0 Irritant contact dermatitis due to detergents Facility Procedures CPT4 Code: [id number] Description: 11042 - DEB SUBQ TISSUE 20 SQ CM/< ICD-10 Diagnosis Description L89.154 Pressure ulcer of sacral region, stage 4 Modifier: Quantity: 1 Physician Procedures CPT4 Code: [id number] Description: 76720 - WC PHYS SUBQ TISS 20 SQ CM ICD-10 Diagnosis Description L89.154 Pressure ulcer of sacral region, stage  4 Modifier: Quantity: 1 Electronic Signature(s) Signed: 04/11/2018 12:21:38 AM By: Worthy Keeler PA-C Entered By: Worthy Keeler on 04/10/2018 09:37:48

## 2018-04-12 NOTE — Progress Notes (Signed)
[name], [name] ([id number]) Visit Report for 04/10/2018 Arrival Information Details Patient Name: [name], [name]. Date of Service: 04/10/2018 8:45 AM Medical Record Number: [id number] Patient Account Number: [id number] Date of Birth/Sex: 20-Jul-1931 (84 y.o. M) Treating RN: Secundino Ginger Primary Care Novaleigh Kohlman: Burman Freestone Other Clinician: Referring Zimal Weisensel: Burman Freestone Treating Maryetta Shafer/Extender: [name], HOYT Weeks in Treatment: 9 Visit Information History Since Last Visit Added or deleted any medications: No Patient Arrived: Ambulatory Any new allergies or adverse reactions: No Arrival Time: 09:05 Had a fall or experienced change in No Accompanied By: family activities of daily living that may affect Transfer Assistance: None risk of falls: Patient Identification Verified: Yes Signs or symptoms of abuse/neglect since last visito No Secondary Verification Process Completed: Yes Hospitalized since last visit: No Patient Requires Transmission-Based No Implantable device outside of the clinic excluding No Precautions: cellular tissue based products placed in the center Patient Has Alerts: Yes since last visit: Has Dressing in Place as Prescribed: Yes Pain Present Now: No Electronic Signature(s) Signed: 04/10/2018 11:20:16 AM By: Secundino Ginger Entered By: Secundino Ginger on 04/10/2018 09:06:09 [name] ([id number]) -------------------------------------------------------------------------------- Encounter Discharge Information Details Patient Name: [name] Date of Service: 04/10/2018 8:45 AM Medical Record Number: [id number] Patient Account Number: [id number] Date of Birth/Sex: Apr 24, 1931 (84 y.o. M) Treating RN: Cornell Barman Primary Care [name] [name]: Burman Freestone Other Clinician: Referring Simcha Farrington: Burman Freestone Treating Trei Schoch/Extender: [name], HOYT Weeks in Treatment: 9 Encounter Discharge Information Items Discharge Condition: Stable Ambulatory  Status: Ambulatory Discharge Destination: Home Transportation: Private Auto Accompanied By: daughters Schedule Follow-up Appointment: Yes Clinical Summary of Care: Electronic Signature(s) Signed: 04/10/2018 5:19:43 PM By: Gretta Cool, BSN, RN, CWS, Kim RN, BSN Entered By: Gretta Cool, BSN, RN, CWS, Kim on 04/10/2018 10:03:55 [name] ([id number]) -------------------------------------------------------------------------------- Lower Extremity Assessment Details Patient Name: [name], [name]. Date of Service: 04/10/2018 8:45 AM Medical Record Number: [id number] Patient Account Number: [id number] Date of Birth/Sex: 02/06/1931 (84 y.o. M) Treating RN: Secundino Ginger Primary Care Samer Dutton: Burman Freestone Other Clinician: Referring Marcellus Pulliam: Burman Freestone Treating Beryl Balz/Extender: [name], HOYT Weeks in Treatment: 9 Electronic Signature(s) Signed: 04/10/2018 11:20:16 AM By: Secundino Ginger Entered By: Secundino Ginger on 04/10/2018 09:07:52 [name] ([id number]) -------------------------------------------------------------------------------- Multi Wound Chart Details Patient Name: [name] Date of Service: 04/10/2018 8:45 AM Medical Record Number: [id number] Patient Account Number: [id number] Date of Birth/Sex: 1931/02/06 (84 y.o. M) Treating RN: Montey Hora Primary Care Daine Croker: Burman Freestone Other Clinician: Referring Maria Gallicchio: Burman Freestone Treating Levis Nazir/Extender: [name], HOYT Weeks in Treatment: 9 Vital Signs Height(in): 69 Pulse(bpm): 66 Weight(lbs): 155 Blood Pressure(mmHg): 117/58 Body Mass Index(BMI): 23 Temperature(F): 98.4 Respiratory Rate 18 (breaths/min): Photos: [N/A:N/A] Wound Location: Sacrum N/A N/A Wounding Event: Pressure Injury N/A N/A Primary Etiology: Pressure Ulcer N/A N/A Comorbid History: Cataracts, Asthma, Angina, N/A N/A Arrhythmia, Coronary Artery Disease, Hypertension, Myocardial Infarction, Osteoarthritis,  Dementia, Neuropathy Date Acquired: 01/17/2018 N/A N/A Weeks of Treatment: 9 N/A N/A Wound Status: Open N/A N/A Measurements L x W x D 3.5x3x1.3 N/A N/A (cm) Area (cm) : 8.247 N/A N/A Volume (cm) : 10.721 N/A N/A % Reduction in Area: 59.60% N/A N/A % Reduction in Volume: 73.70% N/A N/A Classification: Category/Stage III N/A N/A Exudate Amount: Large N/A N/A Exudate Type: Purulent N/A N/A Exudate Color: yellow, brown, green N/A N/A Wound Margin: Distinct, outline attached N/A N/A Granulation Amount: Small (1-33%) N/A N/A Granulation Quality: Pink, Hyper-granulation N/A N/A Necrotic Amount: Large (67-100%) N/A N/A Exposed Structures: Fat Layer (Subcutaneous N/A N/A Tissue) Exposed: Yes  Fascia: No Tendon: No Muscle: No [name], [name] ([id number]) Joint: No Bone: No Epithelialization: None N/A N/A Periwound Skin Texture: Excoriation: Yes N/A N/A Induration: No Callus: No Crepitus: No Rash: No Scarring: No Periwound Skin Moisture: Maceration: No N/A N/A Dry/Scaly: No Periwound Skin Color: Erythema: Yes N/A N/A Atrophie Blanche: No Cyanosis: No Ecchymosis: No Hemosiderin Staining: No Mottled: No Pallor: No Rubor: No Erythema Location: Circumferential N/A N/A Temperature: No Abnormality N/A N/A Tenderness on Palpation: Yes N/A N/A Wound Preparation: Ulcer Cleansing: N/A N/A Rinsed/Irrigated with Saline Topical Anesthetic Applied: Other: lidocaine 4% Treatment Notes Electronic Signature(s) Signed: 04/10/2018 5:42:13 PM By: Montey Hora Entered By: Montey Hora on 04/10/2018 09:28:39 [name] ([id number]) -------------------------------------------------------------------------------- Strum Details Patient Name: [name], [name]. Date of Service: 04/10/2018 8:45 AM Medical Record Number: [id number] Patient Account Number: [id number] Date of Birth/Sex: [birth year] (84 y.o. M) Treating RN: Montey Hora Primary Care Gustavo Meditz:  Burman Freestone Other Clinician: Referring Christen Bedoya: Burman Freestone Treating Aubreanna Percle/Extender: [name], HOYT Weeks in Treatment: 9 Active Inactive ` Abuse / Safety / Falls / Self Care Management Nursing Diagnoses: History of Falls Goals: Patient will not experience any injury related to falls Date Initiated: 02/06/2018 Target Resolution Date: 03/08/2018 Goal Status: Active Interventions: Assess fall risk on admission and as needed Treatment Activities: Patient referred to home care : 02/06/2018 Notes: ` Necrotic Tissue Nursing Diagnoses: Impaired tissue integrity related to necrotic/devitalized tissue Goals: Necrotic/devitalized tissue will be minimized in the wound bed Date Initiated: 02/06/2018 Target Resolution Date: 03/08/2018 Goal Status: Active Interventions: Assess patient pain level pre-, during and post procedure and prior to discharge Treatment Activities: Excisional debridement : 02/06/2018 Notes: ` Nutrition Nursing Diagnoses: Potential for alteratiion in Nutrition/Potential for imbalanced nutrition [name], [name] ([id number]) Goals: Patient/caregiver agrees to and verbalizes understanding of need to obtain nutritional consultation Date Initiated: 02/06/2018 Target Resolution Date: 03/08/2018 Goal Status: Active Interventions: Provide education on nutrition Notes: ` Orientation to the Wound Care Program Nursing Diagnoses: Knowledge deficit related to the wound healing center program Goals: Patient/caregiver will verbalize understanding of the [facility] Date Initiated: 02/06/2018 Target Resolution Date: 03/08/2018 Goal Status: Active Interventions: Provide education on orientation to the wound center Notes: ` Pressure Nursing Diagnoses: Knowledge deficit related to management of pressures ulcers Goals: Patient/caregiver will verbalize understanding of pressure ulcer management Date Initiated: 02/06/2018 Target Resolution  Date: 03/08/2018 Goal Status: Active Interventions: Assess offloading mechanisms upon admission and as needed Provide education on pressure ulcers Notes: ` Wound/Skin Impairment Nursing Diagnoses: Knowledge deficit related to ulceration/compromised skin integrity Goals: Ulcer/skin breakdown will have a volume reduction of 80% by week 12 Date Initiated: 02/06/2018 Target Resolution Date: 04/08/2018 Goal Status: Active [name], [name] ([id number]) Interventions: Assess ulceration(s) every visit Treatment Activities: Skin care regimen initiated : 02/06/2018 Topical wound management initiated : 02/06/2018 Notes: Electronic Signature(s) Signed: 04/10/2018 5:42:13 PM By: Montey Hora Entered By: Montey Hora on 04/10/2018 09:27:43 [name] ([id number]) -------------------------------------------------------------------------------- Pain Assessment Details Patient Name: [name] Date of Service: 04/10/2018 8:45 AM Medical Record Number: [id number] Patient Account Number: [id number] Date of Birth/Sex: [birth year] (84 y.o. M) Treating RN: Secundino Ginger Primary Care Sharian Delia: Burman Freestone Other Clinician: Referring Jamika Sadek: Burman Freestone Treating Camara Renstrom/Extender: [name], HOYT Weeks in Treatment: 9 Active Problems Location of Pain Severity and Description of Pain Patient Has Paino No Site Locations Pain Management and Medication Current Pain Management: Goals for Pain Management Topical or injectable lidocaine is offered to patient for acute pain when surgical debridement  is performed. If needed, Patient is instructed to use over the counter pain medication for the following 24-48 hours after debridement. Wound care MDs do not prescribed pain medications. Patient has chronic pain or uncontrolled pain. Patient has been instructed to make an appointment with their Primary Care Physician for pain management. Electronic Signature(s) Signed: 04/10/2018 11:20:16  AM By: Secundino Ginger Entered By: Secundino Ginger on 04/10/2018 09:07:02 [name] ([id number]) -------------------------------------------------------------------------------- Patient/Caregiver Education Details Patient Name: [name] Date of Service: 04/10/2018 8:45 AM Medical Record Number: [id number] Patient Account Number: [id number] Date of Birth/Gender: July 09, 1931 (84 y.o. M) Treating RN: Cornell Barman Primary Care Physician: Burman Freestone Other Clinician: Referring Physician: Burman Freestone Treating Physician/Extender: Sharalyn Ink in Treatment: 9 Education Assessment Education Provided To: Patient Education Topics Provided Wound/Skin Impairment: Handouts: Caring for Your Ulcer Methods: Demonstration, Explain/Verbal Responses: State content correctly Electronic Signature(s) Signed: 04/10/2018 5:19:43 PM By: Gretta Cool, BSN, RN, CWS, Kim RN, BSN Entered By: Gretta Cool, BSN, RN, CWS, Kim on 04/10/2018 10:04:10 [name] ([id number]) -------------------------------------------------------------------------------- Wound Assessment Details Patient Name: [name], [name]. Date of Service: 04/10/2018 8:45 AM Medical Record Number: [id number] Patient Account Number: [id number] Date of Birth/Sex: 11/04/1930 (84 y.o. M) Treating RN: Secundino Ginger Primary Care Nimai Burbach: Burman Freestone Other Clinician: Referring Ransom Nickson: Burman Freestone Treating Amara Justen/Extender: [name], HOYT Weeks in Treatment: 9 Wound Status Wound Number: 1 Primary Pressure Ulcer Etiology: Wound Location: Sacrum Wound Open Wounding Event: Pressure Injury Status: Date Acquired: 01/17/2018 Comorbid Cataracts, Asthma, Angina, Arrhythmia, Weeks Of Treatment: 9 History: Coronary Artery Disease, Hypertension, Clustered Wound: No Myocardial Infarction, Osteoarthritis, Dementia, Neuropathy Photos Photo Uploaded By: Secundino Ginger on 04/10/2018 09:20:54 Wound Measurements Length: (cm) 3.5 %  Reduction Width: (cm) 3 % Reduction Depth: (cm) 1.3 Epitheliali Area: (cm) 8.247 Volume: (cm) 10.721 in Area: 59.6% in Volume: 73.7% zation: None Wound Description Classification: Category/Stage III Foul Odor Wound Margin: Distinct, outline attached Slough/Fib Exudate Amount: Large Exudate Type: Purulent Exudate Color: yellow, brown, green After Cleansing: No rino Yes Wound Bed Granulation Amount: Small (1-33%) Exposed Structure Granulation Quality: Pink, Hyper-granulation Fascia Exposed: No Necrotic Amount: Large (67-100%) Fat Layer (Subcutaneous Tissue) Exposed: Yes Necrotic Quality: Adherent Slough Tendon Exposed: No Muscle Exposed: No Joint Exposed: No Bone Exposed: No Periwound Skin Texture Texture Color [name], [name] ([id number]) No Abnormalities Noted: No No Abnormalities Noted: No Callus: No Atrophie Blanche: No Crepitus: No Cyanosis: No Excoriation: Yes Ecchymosis: No Induration: No Erythema: Yes Rash: No Erythema Location: Circumferential Scarring: No Hemosiderin Staining: No Mottled: No Moisture Pallor: No No Abnormalities Noted: No Rubor: No Dry / Scaly: No Maceration: No Temperature / Pain Temperature: No Abnormality Tenderness on Palpation: Yes Wound Preparation Ulcer Cleansing: Rinsed/Irrigated with Saline Topical Anesthetic Applied: Other: lidocaine 4%, Treatment Notes Wound #1 (Sacrum) 1. Cleansed with: Clean wound with Normal Saline 8. Negative Pressure Wound Therapy Wound Vac to wound continuously at 168mm/hg pressure Black Foam Number of foam/gauze pieces used in the dressing = Change canister as needed. Notes 2 pieces of black gauze used. Bridged to left side Electronic Signature(s) Signed: 04/10/2018 11:20:16 AM By: Secundino Ginger Entered By: Secundino Ginger on 04/10/2018 09:17:38 [name] ([id number]) -------------------------------------------------------------------------------- Vitals Details Patient Name: [name] Date of Service: 04/10/2018 8:45 AM Medical Record Number: [id number] Patient Account Number: [id number] Date of Birth/Sex: 1931/07/24 (84 y.o. M) Treating RN: Secundino Ginger Primary Care Farouk Vivero: Burman Freestone Other Clinician: Referring Erby Sanderson: Burman Freestone Treating Ayme Short/Extender: STONE III, HOYT Weeks in Treatment: 9 Vital Signs Time Taken:  09:07 Temperature (F): 98.4 Height (in): 69 Pulse (bpm): 66 Weight (lbs): 155 Respiratory Rate (breaths/min): 18 Body Mass Index (BMI): 22.9 Blood Pressure (mmHg): 117/58 Reference Range: 80 - 120 mg / dl Electronic Signature(s) Signed: 04/10/2018 11:20:16 AM By: Secundino Ginger Entered BySecundino Ginger on 04/10/2018 09:07:29

## 2018-04-17 ENCOUNTER — Encounter: Payer: Medicare Other | Attending: Physician Assistant | Admitting: Physician Assistant

## 2018-04-17 DIAGNOSIS — F0390 Unspecified dementia without behavioral disturbance: Secondary | ICD-10-CM | POA: Insufficient documentation

## 2018-04-17 DIAGNOSIS — I2510 Atherosclerotic heart disease of native coronary artery without angina pectoris: Secondary | ICD-10-CM | POA: Diagnosis not present

## 2018-04-17 DIAGNOSIS — I252 Old myocardial infarction: Secondary | ICD-10-CM | POA: Diagnosis not present

## 2018-04-17 DIAGNOSIS — I10 Essential (primary) hypertension: Secondary | ICD-10-CM | POA: Insufficient documentation

## 2018-04-17 DIAGNOSIS — L89154 Pressure ulcer of sacral region, stage 4: Secondary | ICD-10-CM | POA: Diagnosis not present

## 2018-04-19 NOTE — Progress Notes (Signed)
JAYCEION, LISENBY ([id number]) Visit Report for 04/17/2018 Chief Complaint Document Details Patient Name: [name], [name]. Date of Service: 04/17/2018 11:00 AM Medical Record Number: [id number] Patient Account Number: [id number] Date of Birth/Sex: Jan 15, 1931 (84 y.o. M) Treating RN: Ahmed Prima Primary Care Provider: Burman Freestone Other Clinician: Referring Provider: Burman Freestone Treating Provider/Extender: [name], HOYT Weeks in Treatment: 10 Information Obtained from: Patient Chief Complaint Sacral pressure ulcer and left shoulder rash Electronic Signature(s) Signed: 04/18/2018 1:22:53 AM By: Worthy Keeler PA-C Entered By: Worthy Keeler on 04/17/2018 11:17:43 [name] ([id number]) -------------------------------------------------------------------------------- Debridement Details Patient Name: [name] Date of Service: 04/17/2018 11:00 AM Medical Record Number: [id number] Patient Account Number: [id number] Date of Birth/Sex: [birth year] (84 y.o. M) Treating RN: Ahmed Prima Primary Care Provider: Burman Freestone Other Clinician: Referring Provider: Burman Freestone Treating Provider/Extender: [name], HOYT Weeks in Treatment: 10 Debridement Performed for Wound #1 Sacrum Assessment: Performed By: Physician STONE III, HOYT E., PA-C Debridement Type: Debridement Pre-procedure Verification/Time Yes - 11:25 Out Taken: Start Time: 11:25 Pain Control: Lidocaine 4% Topical Solution Total Area Debrided (L x W): 2.4 (cm) x 1.7 (cm) = 4.08 (cm) Tissue and other material Viable, Non-Viable, Slough, Subcutaneous, Fibrin/Exudate, Slough debrided: Level: Skin/Subcutaneous Tissue Debridement Description: Excisional Instrument: Curette Bleeding: Minimum Hemostasis Achieved: Pressure End Time: 11:28 Procedural Pain: 0 Post Procedural Pain: 0 Response to Treatment: Procedure was tolerated well Level of Consciousness: Awake and Alert Post Procedure  Vitals: Temperature: 98.7 Pulse: 70 Respiratory Rate: 16 Blood Pressure: Systolic Blood Pressure: 703 Diastolic Blood Pressure: 87 Post Debridement Measurements of Total Wound Length: (cm) 2.4 Stage: Category/Stage III Width: (cm) 1.7 Depth: (cm) 0.7 Volume: (cm) 2.243 Character of Wound/Ulcer Post Requires Further Debridement Debridement: Post Procedure Diagnosis Same as Pre-procedure Electronic Signature(s) Signed: 04/17/2018 4:10:39 PM By: Alric Quan Signed: 04/18/2018 1:22:53 AM By: Worthy Keeler PA-C Entered By: Alric Quan on 04/17/2018 11:28:39 RAYCE, BRAHMBHATT ([id number], WINKLES ([id number]) -------------------------------------------------------------------------------- HPI Details Patient Name: [name], [name]. Date of Service: 04/17/2018 11:00 AM Medical Record Number: [id number] Patient Account Number: [id number] Date of Birth/Sex: [birth year] (84 y.o. M) Treating RN: Ahmed Prima Primary Care Provider: Burman Freestone Other Clinician: Referring Provider: Burman Freestone Treating Provider/Extender: [name], HOYT Weeks in Treatment: 10 History of Present Illness HPI Description: 02/06/18 on evaluation today patient presents for initial evaluation and our clinic concerning an issue which began roughly 3 weeks ago when the patient fell in his home on the floor in his kitchen and laid him down this detergent for roughly 3 days. He had a pressure injury to the left shoulder. This unfortunately has caused him a lot of discomfort although it finally seems to be doing better if anything is really having a lot of itching right now. This appears potentially be a contact dermatitis issue. He also has a significant pressure injury to the sacrum at this time as well which is also showing fascia exposure right over the bone but no evidence of bone exposure at this point which is good news. They have been using Santyl as well as Saline soaked gauze at  this point in time. There does appear to be a lot of necrotic slough in the base of the wound. He does have a history of incontinence, myocardial infarction, and hypertension. He also is "borderline diabetic" hemoglobin A1c of 6.0. Currently he has some discomfort in the pressure site at the sacrum but fortunately nothing too significant this did require sharp debridement today. 02/13/18 on evaluation  today patient appears to be doing much better in regard to his sacral wound. He has been tolerating the dressing changes without complication with the Vashe. Fortunately there is no evidence of infection and though there is some Slough on the surface of the wound bed he has excellent granulation noted. Overall I'm pleased with how things have progressed in that regard. A glance at his shoulder as well and the rash seems to be someone improving in my pinion at this site as well. Overall I am pleased with what we're seeing and so is the family. 02/20/18 on evaluation today patient appears to be doing a little worse in regard to the sacral wound only in the fact that there is redness surrounding it has me somewhat concerned for infection. The drainage has also apparently been a little bit off color compared to normal according to family they did keep the dressing today that was removed to show me and I agree this seems to be a little bit different compared to what we have been seeing. Coupled with the redness I'm concerned he may be developing some cellulitis surrounding the wound bed. 02/27/18 on evaluation today patient presents for follow-up concerning his sacral ulcer. We have received the results back from his wound culture which shows unfortunately that the doxycycline will not be of benefit for him I am going to need to initiate treatment with something else in order to treat the pseudomonas. Otherwise he does not seem to be having any significant pain although his daughter states there are sometimes  when he states having pain. We continue to use the Vashe currently. 03/06/18 on evaluation today patient's sacral wound appears to be doing better in my opinion. He has been tolerating the dressing changes without complication. With that being said the silver nitrate has helped with the prominent area of hyper granulation at the 6 o'clock location we will likely need to repeat this again today. Nonetheless overall I am pleased with how things have improved over the last week. The erythema surrounding the wound seems to be greatly improved. 03/13/18 on evaluation today patient's wound actually does not appear to be terribly infected although she does continue to have erythema surrounding the wound bed especially on the left border. I'm still somewhat concerned about the fact that the oral antibiotics alone may not be completely treating his infection. I previously discussed may need to go for IV antibiotic therapy I'm concerned that may be the case. We will need to make a referral today for infectious disease. 03/20/18 on evaluation today the patient sacral wound actually appears to be doing fairly well in regard to granulation although he continues unfortunately to have it your theme is surrounding the periwound region. There's also some increased swelling at the 6 o'clock location which also has me somewhat concerned. With that being said he does have some discomfort but nothing too significant at this point. He still has not heard from infectious disease his daughter and wife are both present during the office visit today they're going to check back with this again. We did get the information for them to call them today. 03/27/18 on evaluation today patient is seen concerning his ongoing sacral ulcer. He has been tolerating the dressing changes without complication. With that being said he does present with evidence of bright green drainage noted on the dressing which again is something that I do often  expect to see with a pseudomonas infection. He continues to have your theme is Bruno,  [name] ([id number]) surrounding the wound bed as well and again I'm not 100% convinced this is just pressure related. I did speak with Colletta Maryland who is the nurse practitioner in [place] with infectious disease. I spoke with her actually yesterday concerning this patient. She is not 100% convinced that this is infected. She question whether the wound may just be colonized with Pseudomonas and not actually causing an active infection. I am really not thinking that the edema is associated with pressure alone and again overall I don't feel that your theme and is consistent with a pressure injury either as he's never had any contusions noted like a deep tissue injury on his heel which was new and I did visualize today this was on the left heel. Nonetheless she wanted to give this a little bit more time and thought it would be appropriate to start the Wound VAC at this point. 04/03/18 on evaluation today patient sacral ulcer actually appears to be doing fairly well at this point. He has been tolerating the dressing changes without complication. With that being said I'm very pleased with the progress that has been made in regard to his sacral wound over the past week I do not see as much in the way of erythema which is great news. Nonetheless he does have a small area of hyper granulation unfortunately. This is at roughly the 7 o'clock location and I think does need to be addressed so that this will heal more appropriately. Nonetheless I think we may be ready to go ahead and initiate therapy with the Wound VAC. 04/10/18 on evaluation today patient appears to be doing excellent in regard to his sacral ulcer. The show signs of great improvement in overall I'm very pleased with how things look. He has been tolerating the dressing changes without complication. Specifically this is the Wound VAC. He also seems to be doing  well with the antibiotic there is decreased your theme and redness surrounding the sacral area at this point in the wound has filled in quite significantly. 04/17/18 on evaluation today patient actually appears to be doing excellent in regard to his sacral ulcer. He's been tolerating the dressing changes without complication specifically the Wound VAC. There really are no major concerns from the patient nor family this point he is having no pain. He does have a little bit of Epiboly on the lateral portions of the wound where he does have a little bit more depth that will need to be addressed today. Electronic Signature(s) Signed: 04/18/2018 1:22:53 AM By: Worthy Keeler PA-C Entered By: Worthy Keeler on 04/18/2018 01:20:52 [name] ([id number]) -------------------------------------------------------------------------------- Physical Exam Details Patient Name: [name], [name] Date of Service: 04/17/2018 11:00 AM Medical Record Number: [id number] Patient Account Number: [id number] Date of Birth/Sex: [birth year] (84 y.o. M) Treating RN: Ahmed Prima Primary Care Provider: Burman Freestone Other Clinician: Referring Provider: Burman Freestone Treating Provider/Extender: STONE III, HOYT Weeks in Treatment: 10 Constitutional Well-nourished and well-hydrated in no acute distress. Respiratory normal breathing without difficulty. Psychiatric this patient is able to make decisions and demonstrates good insight into disease process. Alert and Oriented x 3. pleasant and cooperative. Notes Patient has excellent granulation I did perform sharp debridement to remove the slough from the surface of the wound as well as remove the Epiboly from the edge of the wound in order to allow this to continually granulate in without running into any roadblocks. Again he tolerated all this without complication and I explained what I was  doing with the family as the procedure was being performed. Overall  post debridement the wound bed appears to be doing even better on very pleased at this point. Electronic Signature(s) Signed: 04/18/2018 1:22:53 AM By: Worthy Keeler PA-C Entered By: Worthy Keeler on 04/18/2018 01:21:49 [name] ([id number]) -------------------------------------------------------------------------------- Physician Orders Details Patient Name: [name], [name] Date of Service: 04/17/2018 11:00 AM Medical Record Number: [id number] Patient Account Number: [id number] Date of Birth/Sex: 07/08/1931 (84 y.o. M) Treating RN: Ahmed Prima Primary Care Provider: Burman Freestone Other Clinician: Referring Provider: Burman Freestone Treating Provider/Extender: [name], HOYT Weeks in Treatment: 10 Verbal / Phone Orders: Yes Clinician: Carolyne Fiscal, Debi Read Back and Verified: Yes Diagnosis Coding ICD-10 Coding Code Description L89.154 Pressure ulcer of sacral region, stage 4 L24.0 Irritant contact dermatitis due to detergents Wound Cleansing Wound #1 Sacrum o Clean wound with Normal Saline. Anesthetic (add to Medication List) Wound #1 Sacrum o Topical Lidocaine 4% cream applied to wound bed prior to debridement (In Clinic Only). Skin Barriers/Peri-Wound Care Wound #1 Sacrum o Skin Prep Primary Wound Dressing Wound #1 Sacrum o Saline moistened gauze - Done in office and HHRN will place wound vac on pt Wednesday. Secondary Dressing Wound #1 Sacrum o Dry Gauze - Done in office and HHRN will place wound vac on pt Wednesday. o Boardered Foam Dressing - Done in office and [facility] will place wound vac on pt Wednesday. Dressing Change Frequency Wound #1 Sacrum o Change Dressing Monday, Wednesday, Friday - HHRN to go out to pts home MONDAYS after his appointment with [facility] to put the wound vac back on and change wound vac on [facility] and [place]. Follow-up Appointments Wound #1 Sacrum o Return Appointment in 1 week. Off-Loading Wound #1  Sacrum o Mattress - hospital bed o Turn and reposition every 2 hours o Other: - please make sure the pt is floating his heels while lying in bed [name], [name]. ([id number]) Additional Orders / Instructions Wound #1 Sacrum o Increase protein intake. Home Health Wound #1 [place] Visits - [facility] to go out to pts home MONDAYS after his appointment with [facility] to put the wound vac back on and change wound vac on [facility] and [place]IDA[place]. o Home Health Nurse may visit PRN to address patientos wound care needs. o FACE TO FACE ENCOUNTER: MEDICARE and MEDICAID PATIENTS: I certify that this patient is under my care and that I had a face-to-face encounter that meets the physician face-to-face encounter requirements with this patient on this date. The encounter with the patient was in whole or in part for the following MEDICAL CONDITION: (primary reason for [place]) MEDICAL NECESSITY: I certify, that based on my findings, NURSING services are a medically necessary home health service. HOME BOUND STATUS: I certify that my clinical findings support that this patient is homebound (i.e., Due to illness or injury, pt requires aid of supportive devices such as crutches, cane, wheelchairs, walkers, the use of special transportation or the assistance of another person to leave their place of residence. There is a normal inability to leave the home and doing so requires considerable and taxing effort. Other absences are for medical reasons / religious services and are infrequent or of short duration when for other reasons). o If current dressing causes regression in wound condition, may D/C ordered dressing product/s and apply Normal Saline Moist Dressing daily until next [place] / Other MD appointment. [place] of  regression in wound condition at [phone]. o Please direct any NON-WOUND related issues/requests  for orders to patient's Primary Care Physician Negative Pressure Wound Therapy Wound #1 Sacrum o Wound VAC settings at 125/130 mmHg continuous pressure. Use BLACK/GREEN foam to wound cavity. Use WHITE foam to fill any tunnel/s and/or undermining. Change VAC dressing 3 X WEEK. Change canister as indicated when full. Nurse may titrate settings and frequency of dressing changes as clinically indicated. o Home Health Nurse may d/c VAC for s/s of increased infection, significant wound regression, or uncontrolled drainage. [place] at [phone]. o Number of foam/gauze pieces used in the dressing = Patient Medications Allergies: Lyrica, gabapentin, Sulfa (Sulfonamide Antibiotics), contrast dye, budesonide, iohexol, simvastatin, Demerol, meperidine, adhesive tape, naproxen, pregabalin Notifications Medication Indication Start End lidocaine DOSE 1 - topical 4 % cream - 1 cream topical Electronic Signature(s) Signed: 04/17/2018 4:10:39 PM By: Alric Quan Signed: 04/18/2018 1:22:53 AM By: Worthy Keeler PA-C Entered By: Alric Quan on 04/17/2018 11:31:34 [name] ([id number]) -------------------------------------------------------------------------------- Prescription 04/17/2018 Patient Name: [name] Provider: Worthy Keeler PA-C Date of Birth: 24-Jul-1931 NPI#: [id number] Sex: M DEA#: [id number] Phone #: [phone] License #: Patient Address: [facility] [place], [place] [address] [address], [place], [place] [address] [phone] Allergies Lyrica gabapentin Sulfa (Sulfonamide Antibiotics) contrast dye budesonide iohexol simvastatin Demerol meperidine adhesive tape naproxen pregabalin Medication Medication: Route: Strength: Form: lidocaine 4 % topical cream topical 4% cream Class: TOPICAL LOCAL ANESTHETICS Indication: BABE, CLENNEY ([id number]) Dose: Frequency / Time: 1 1 cream topical Number of Refills: Number of Units: 0 Generic Substitution: Start Date: End Date: One Time Use: Substitution Permitted No Note to Pharmacy: Signature(s): Date(s): Electronic Signature(s) Signed: 04/17/2018 4:10:39 PM By: Alric Quan Signed: 04/18/2018 1:22:53 AM By: Worthy Keeler PA-C Entered By: Alric Quan on 04/17/2018 11:31:35 [name] ([id number]) --------------------------------------------------------------------------------  Problem List Details Patient Name: OSMOND, STECKMAN. Date of Service: 04/17/2018 11:00 AM Medical Record Number: [id number] Patient Account Number: [id number] Date of Birth/Sex: Mar 09, 1931 (84 y.o. M) Treating RN: Ahmed Prima Primary Care Provider: Burman Freestone Other Clinician: Referring Provider: Burman Freestone Treating Provider/Extender: [name], HOYT Weeks in Treatment: 10 Active Problems ICD-10 Evaluated Encounter Code Description Active Date Today Diagnosis L89.154 Pressure ulcer of sacral region, stage 4 02/06/2018 No Yes L24.0 Irritant contact dermatitis due to detergents 02/06/2018 No Yes Inactive Problems Resolved Problems Electronic Signature(s) Signed: 04/18/2018 1:22:53 AM By: Worthy Keeler PA-C Entered By: Worthy Keeler on 04/17/2018 11:17:33 [name] ([id number]) -------------------------------------------------------------------------------- Progress Note Details Patient Name: [name] Date of Service: 04/17/2018 11:00 AM Medical Record Number: [id number] Patient Account Number: [id number] Date of Birth/Sex: 05-06-1931 (84 y.o. M) Treating RN: Ahmed Prima Primary Care Provider: Burman Freestone Other Clinician: Referring Provider: Burman Freestone Treating Provider/Extender: [name], HOYT Weeks in Treatment: 10 Subjective Chief Complaint Information obtained from Patient Sacral pressure ulcer and left shoulder  rash History of Present Illness (HPI) 02/06/18 on evaluation today patient presents for initial evaluation and our clinic concerning an issue which began roughly 3 weeks ago when the patient fell in his home on the floor in his kitchen and laid him down this detergent for roughly 3 days. He had a pressure injury to the left shoulder. This unfortunately has caused him a lot of discomfort although it finally seems to be doing better if anything is really having a lot of itching right  now. This appears potentially be a contact dermatitis issue. He also has a significant pressure injury to the sacrum at this time as well which is also showing fascia exposure right over the bone but no evidence of bone exposure at this point which is good news. They have been using Santyl as well as Saline soaked gauze at this point in time. There does appear to be a lot of necrotic slough in the base of the wound. He does have a history of incontinence, myocardial infarction, and hypertension. He also is "borderline diabetic" hemoglobin A1c of 6.0. Currently he has some discomfort in the pressure site at the sacrum but fortunately nothing too significant this did require sharp debridement today. 02/13/18 on evaluation today patient appears to be doing much better in regard to his sacral wound. He has been tolerating the dressing changes without complication with the Vashe. Fortunately there is no evidence of infection and though there is some Slough on the surface of the wound bed he has excellent granulation noted. Overall I'm pleased with how things have progressed in that regard. A glance at his shoulder as well and the rash seems to be someone improving in my pinion at this site as well. Overall I am pleased with what we're seeing and so is the family. 02/20/18 on evaluation today patient appears to be doing a little worse in regard to the sacral wound only in the fact that there is redness surrounding it has me  somewhat concerned for infection. The drainage has also apparently been a little bit off color compared to normal according to family they did keep the dressing today that was removed to show me and I agree this seems to be a little bit different compared to what we have been seeing. Coupled with the redness I'm concerned he may be developing some cellulitis surrounding the wound bed. 02/27/18 on evaluation today patient presents for follow-up concerning his sacral ulcer. We have received the results back from his wound culture which shows unfortunately that the doxycycline will not be of benefit for him I am going to need to initiate treatment with something else in order to treat the pseudomonas. Otherwise he does not seem to be having any significant pain although his daughter states there are sometimes when he states having pain. We continue to use the Vashe currently. 03/06/18 on evaluation today patient's sacral wound appears to be doing better in my opinion. He has been tolerating the dressing changes without complication. With that being said the silver nitrate has helped with the prominent area of hyper granulation at the 6 o'clock location we will likely need to repeat this again today. Nonetheless overall I am pleased with how things have improved over the last week. The erythema surrounding the wound seems to be greatly improved. 03/13/18 on evaluation today patient's wound actually does not appear to be terribly infected although she does continue to have erythema surrounding the wound bed especially on the left border. I'm still somewhat concerned about the fact that the oral antibiotics alone may not be completely treating his infection. I previously discussed may need to go for IV antibiotic therapy I'm concerned that may be the case. We will need to make a referral today for infectious disease. 03/20/18 on evaluation today the patient sacral wound actually appears to be doing fairly well  in regard to granulation although he continues unfortunately to have it your theme is surrounding the periwound region. There's also some increased swelling at the  6 o'clock location which also has me somewhat concerned. With that being said he does have some discomfort but [name], [name]. ([id number]) nothing too significant at this point. He still has not heard from infectious disease his daughter and wife are both present during the office visit today they're going to check back with this again. We did get the information for them to call them today. 03/27/18 on evaluation today patient is seen concerning his ongoing sacral ulcer. He has been tolerating the dressing changes without complication. With that being said he does present with evidence of bright green drainage noted on the dressing which again is something that I do often expect to see with a pseudomonas infection. He continues to have your theme is surrounding the wound bed as well and again I'm not 100% convinced this is just pressure related. I did speak with Colletta Maryland who is the nurse practitioner in [place] with infectious disease. I spoke with her actually yesterday concerning this patient. She is not 100% convinced that this is infected. She question whether the wound may just be colonized with Pseudomonas and not actually causing an active infection. I am really not thinking that the edema is associated with pressure alone and again overall I don't feel that your theme and is consistent with a pressure injury either as he's never had any contusions noted like a deep tissue injury on his heel which was new and I did visualize today this was on the left heel. Nonetheless she wanted to give this a little bit more time and thought it would be appropriate to start the Wound VAC at this point. 04/03/18 on evaluation today patient sacral ulcer actually appears to be doing fairly well at this point. He has been tolerating the  dressing changes without complication. With that being said I'm very pleased with the progress that has been made in regard to his sacral wound over the past week I do not see as much in the way of erythema which is great news. Nonetheless he does have a small area of hyper granulation unfortunately. This is at roughly the 7 o'clock location and I think does need to be addressed so that this will heal more appropriately. Nonetheless I think we may be ready to go ahead and initiate therapy with the Wound VAC. 04/10/18 on evaluation today patient appears to be doing excellent in regard to his sacral ulcer. The show signs of great improvement in overall I'm very pleased with how things look. He has been tolerating the dressing changes without complication. Specifically this is the Wound VAC. He also seems to be doing well with the antibiotic there is decreased your theme and redness surrounding the sacral area at this point in the wound has filled in quite significantly. 04/17/18 on evaluation today patient actually appears to be doing excellent in regard to his sacral ulcer. He's been tolerating the dressing changes without complication specifically the Wound VAC. There really are no major concerns from the patient nor family this point he is having no pain. He does have a little bit of Epiboly on the lateral portions of the wound where he does have a little bit more depth that will need to be addressed today. Patient History Information obtained from Patient. Family History Cancer - Paternal Grandparents, Diabetes - Father, Heart Disease - Mother,Father, Stroke - Father, No family history of Hypertension, Kidney Disease, Lung Disease, Seizures, Thyroid Problems, Tuberculosis. Social History Never smoker, Marital Status - Widowed, Alcohol Use - Never,  Drug Use - No History, Caffeine Use - Daily. Medical History Hospitalization/Surgery History - 01/20/2018, [facility], [facility]. Medical And Surgical History  Notes Endocrine Borderline Oncologic Melanoma on back Review of Systems (ROS) Constitutional Symptoms (General Health) Denies complaints or symptoms of Fever, Chills. Respiratory The patient has no complaints or symptoms. Cardiovascular The patient has no complaints or symptoms. [name], [name] ([id number]) Psychiatric The patient has no complaints or symptoms. Objective Constitutional Well-nourished and well-hydrated in no acute distress. Vitals Time Taken: 11:08 AM, Height: 69 in, Weight: 155 lbs, BMI: 22.9, Temperature: 98.7 F, Pulse: 70 bpm, Respiratory Rate: 18 breaths/min, Blood Pressure: 141/87 mmHg. Respiratory normal breathing without difficulty. Psychiatric this patient is able to make decisions and demonstrates good insight into disease process. Alert and Oriented x 3. pleasant and cooperative. General Notes: Patient has excellent granulation I did perform sharp debridement to remove the slough from the surface of the wound as well as remove the Epiboly from the edge of the wound in order to allow this to continually granulate in without running into any roadblocks. Again he tolerated all this without complication and I explained what I was doing with the family as the procedure was being performed. Overall post debridement the wound bed appears to be doing even better on very pleased at this point. Integumentary (Hair, Skin) Wound #1 status is Open. Original cause of wound was Pressure Injury. The wound is located on the Sacrum. The wound measures 2.4cm length x 1.7cm width x 0.6cm depth; 3.204cm^2 area and 1.923cm^3 volume. There is Fat Layer (Subcutaneous Tissue) Exposed exposed. There is no tunneling noted, however, there is undermining starting at 2:00 and ending at 5:00 with a maximum distance of 0.3cm. There is a large amount of purulent drainage noted. The wound margin is distinct with the outline attached to the wound base. There is medium (34-66%) pink,  hyper - granulation within the wound bed. There is a medium (34-66%) amount of necrotic tissue within the wound bed including Adherent Slough. The periwound skin appearance exhibited: Excoriation, Erythema. The periwound skin appearance did not exhibit: Callus, Crepitus, Induration, Rash, Scarring, Dry/Scaly, Maceration, Atrophie Blanche, Cyanosis, Ecchymosis, Hemosiderin Staining, Mottled, Pallor, Rubor. The surrounding wound skin color is noted with erythema which is circumferential. Periwound temperature was noted as No Abnormality. The periwound has tenderness on palpation. Assessment Active Problems ICD-10 Pressure ulcer of sacral region, stage 4 Irritant contact dermatitis due to detergents [name], [name] ([id number]) Procedures Wound #1 Pre-procedure diagnosis of Wound #1 is a Pressure Ulcer located on the Sacrum . There was a Excisional Skin/Subcutaneous Tissue Debridement with a total area of 4.08 sq cm performed by STONE III, HOYT E., PA-C. With the following instrument(s): Curette to remove Viable and Non-Viable tissue/material. Material removed includes Subcutaneous Tissue, Slough, and Fibrin/Exudate after achieving pain control using Lidocaine 4% Topical Solution. No specimens were taken. A time out was conducted at 11:25, prior to the start of the procedure. A Minimum amount of bleeding was controlled with Pressure. The procedure was tolerated well with a pain level of 0 throughout and a pain level of 0 following the procedure. Patient s Level of Consciousness post procedure was recorded as Awake and Alert. Post Debridement Measurements: 2.4cm length x 1.7cm width x 0.7cm depth; 2.243cm^3 volume. Post debridement Stage noted as Category/Stage III. Character of Wound/Ulcer Post Debridement requires further debridement. Post procedure Diagnosis Wound #1: Same as Pre-Procedure Plan Wound Cleansing: Wound #1 Sacrum: Clean wound with Normal Saline. Anesthetic (add to  Medication List):  Wound #1 Sacrum: Topical Lidocaine 4% cream applied to wound bed prior to debridement (In Clinic Only). Skin Barriers/Peri-Wound Care: Wound #1 Sacrum: Skin Prep Primary Wound Dressing: Wound #1 Sacrum: Saline moistened gauze - Done in office and HHRN will place wound vac on pt Wednesday. Secondary Dressing: Wound #1 Sacrum: Dry Gauze - Done in office and HHRN will place wound vac on pt Wednesday. Boardered Foam Dressing - Done in office and [facility] will place wound vac on pt Wednesday. Dressing Change Frequency: Wound #1 Sacrum: Change Dressing Monday, Wednesday, Friday - HHRN to go out to pts home MONDAYS after his appointment with [facility] to put the wound vac back on and change wound vac on [facility] and FRIDAYS. Follow-up Appointments: Wound #1 Sacrum: Return Appointment in 1 week. Off-Loading: Wound #1 Sacrum: Mattress - hospital bed Turn and reposition every 2 hours Other: - please make sure the pt is floating his heels while lying in bed Additional Orders / Instructions: Wound #1 Sacrum: Increase protein intake. Home Health: Wound #1 SacrumBEVIN, [name] ([id number]) [facility] - [facility] to go out to pts home MONDAYS after his appointment with [facility] to put the wound vac back on and change wound vac on [facility] and [place]. Home Health Nurse may visit PRN to address patient s wound care needs. FACE TO FACE ENCOUNTER: MEDICARE and MEDICAID PATIENTS: I certify that this patient is under my care and that I had a face-to-face encounter that meets the physician face-to-face encounter requirements with this patient on this date. The encounter with the patient was in whole or in part for the following MEDICAL CONDITION: (primary reason for [place]) MEDICAL NECESSITY: I certify, that based on my findings, NURSING services are a medically necessary home health service. HOME BOUND STATUS: I certify that my  clinical findings support that this patient is homebound (i.e., Due to illness or injury, pt requires aid of supportive devices such as crutches, cane, wheelchairs, walkers, the use of special transportation or the assistance of another person to leave their place of residence. There is a normal inability to leave the home and doing so requires considerable and taxing effort. Other absences are for medical reasons / religious services and are infrequent or of short duration when for other reasons). If current dressing causes regression in wound condition, may D/C ordered dressing product/s and apply Normal Saline Moist Dressing daily until next [place] / Other MD appointment. [name] of regression in wound condition at [phone]. Please direct any NON-WOUND related issues/requests for orders to patient's Primary Care Physician Negative Pressure Wound Therapy: Wound #1 Sacrum: Wound VAC settings at 125/130 mmHg continuous pressure. Use BLACK/GREEN foam to wound cavity. Use WHITE foam to fill any tunnel/s and/or undermining. Change VAC dressing 3 X WEEK. Change canister as indicated when full. Nurse may titrate settings and frequency of dressing changes as clinically indicated. Home Health Nurse may d/c VAC for s/s of increased infection, significant wound regression, or uncontrolled drainage. [place] at [phone]. Number of foam/gauze pieces used in the dressing = The following medication(s) was prescribed: lidocaine topical 4 % cream 1 1 cream topical was prescribed at facility At this point I'm gonna suggest that we go ahead and initiate treatment as we have before with the Wound VAC over the next week. We will see were things stand at follow-up. Patient and the family are in agreement with this plan. Please see above for  specific wound care orders. We will see patient for re-evaluation in 1 week(s) here in the clinic. If anything  worsens or changes patient will contact our office for additional recommendations. Electronic Signature(s) Signed: 04/18/2018 1:22:53 AM By: Worthy Keeler PA-C Entered By: Worthy Keeler on 04/18/2018 01:22:15 [name] ([id number]) -------------------------------------------------------------------------------- ROS/PFSH Details Patient Name: [name] Date of Service: 04/17/2018 11:00 AM Medical Record Number: [id number] Patient Account Number: [id number] Date of Birth/Sex: 1931-04-15 (84 y.o. M) Treating RN: Ahmed Prima Primary Care Provider: Burman Freestone Other Clinician: Referring Provider: Burman Freestone Treating Provider/Extender: [name], HOYT Weeks in Treatment: 10 Information Obtained From Patient Wound History Do you currently have one or more open woundso Yes How many open wounds do you currently haveo 2 Approximately how long have you had your woundso 3 weeks Has your wound(s) ever healed and then re-openedo Yes Have you had any lab work done in the past montho Yes Have you tested positive for osteomyelitis (bone infection)o No Have you had any tests for circulation on your legso No Constitutional Symptoms (General Health) Complaints and Symptoms: Negative for: Fever; Chills Eyes Medical History: Positive for: Cataracts - bilateral removal Negative for: Glaucoma; Optic Neuritis Ear/Nose/Mouth/Throat Medical History: Negative for: Chronic sinus problems/congestion; Middle ear problems Hematologic/Lymphatic Medical History: Negative for: Anemia; Hemophilia; Human Immunodeficiency Virus; Lymphedema; Sickle Cell Disease Respiratory Complaints and Symptoms: No Complaints or Symptoms Medical History: Positive for: Asthma Negative for: Aspiration; Chronic Obstructive Pulmonary Disease (COPD); Pneumothorax; Sleep Apnea; Tuberculosis Cardiovascular Complaints and Symptoms: No Complaints or Symptoms Medical History: Positive for: Angina;  Arrhythmia; Coronary Artery Disease; Hypertension; Myocardial Infarction - 2001 Negative for: Congestive Heart Failure; Deep Vein Thrombosis; Hypotension; Peripheral Arterial Disease; Peripheral [name], [name] ([id number]) Venous Disease; Phlebitis; Vasculitis Gastrointestinal Medical History: Negative for: Cirrhosis ; Colitis; Crohnos; Hepatitis A; Hepatitis B; Hepatitis C Endocrine Medical History: Negative for: Type I Diabetes; Type II Diabetes Past Medical History Notes: Borderline Genitourinary Medical History: Negative for: End Stage Renal Disease Immunological Medical History: Negative for: Lupus Erythematosus; Raynaudos; Scleroderma Integumentary (Skin) Medical History: Negative for: History of Burn; History of pressure wounds Musculoskeletal Medical History: Positive for: Osteoarthritis Negative for: Gout; Rheumatoid Arthritis; Osteomyelitis Neurologic Medical History: Positive for: Dementia; Neuropathy Negative for: Quadriplegia; Paraplegia; Seizure Disorder Oncologic Medical History: Negative for: Received Chemotherapy; Received Radiation Past Medical History Notes: Melanoma on back Psychiatric Complaints and Symptoms: No Complaints or Symptoms Medical History: Negative for: Anorexia/bulimia; Confinement Anxiety HBO Extended History Items Eyes: Cataracts [name], [name] ([id number]) Immunizations Pneumococcal Vaccine: Received Pneumococcal Vaccination: Yes Implantable Devices Hospitalization / Surgery History Name of Hospital Purpose of Hospitalization/Surgery Date [facility] 01/20/2018 Family and Social History Cancer: Yes - Paternal Grandparents; Diabetes: Yes - Father; Heart Disease: Yes - Mother,Father; Hypertension: No; Kidney Disease: No; Lung Disease: No; Seizures: No; Stroke: Yes - Father; Thyroid Problems: No; Tuberculosis: No; Never smoker; Marital Status - Widowed; Alcohol Use: Never; Drug Use: No History; Caffeine Use: Daily; Financial  Concerns: No; Food, Clothing or Shelter Needs: No; Support System Lacking: No; Transportation Concerns: No; Advanced Directives: Yes (Not Provided); Patient does not want information on Advanced Directives; Do not resuscitate: Yes (Not Provided); Living Will: Yes (Not Provided); Medical Power of Attorney: Yes - Benz Vand[place] (Not Provided) Physician Affirmation I have reviewed and agree with the above information. Electronic Signature(s) Signed: 04/18/2018 1:22:53 AM By: Worthy Keeler PA-C Signed: 04/18/2018 3:49:29 PM By: Alric Quan Entered By: Worthy Keeler on 04/18/2018 01:21:30 [name] ([id number]) -------------------------------------------------------------------------------- SuperBill  Details Patient Name: [name], [name]. Date of Service: 04/17/2018 Medical Record Number: [id number] Patient Account Number: [id number] Date of Birth/Sex: 1930-11-30 (84 y.o. M) Treating RN: Ahmed Prima Primary Care Provider: Burman Freestone Other Clinician: Referring Provider: Burman Freestone Treating Provider/Extender: [name], HOYT Weeks in Treatment: 10 Diagnosis Coding ICD-10 Codes Code Description L89.154 Pressure ulcer of sacral region, stage 4 L24.0 Irritant contact dermatitis due to detergents Facility Procedures CPT4 Code: [id number] Description: 91028 - DEB SUBQ TISSUE 20 SQ CM/< ICD-10 Diagnosis Description L89.154 Pressure ulcer of sacral region, stage 4 Modifier: Quantity: 1 Physician Procedures CPT4 Code: 9022840 Description: 69861 - WC PHYS SUBQ TISS 20 SQ CM ICD-10 Diagnosis Description L89.154 Pressure ulcer of sacral region, stage 4 Modifier: Quantity: 1 Electronic Signature(s) Signed: 04/18/2018 1:22:53 AM By: Worthy Keeler PA-C Entered By: Worthy Keeler on 04/18/2018 01:22:26

## 2018-04-19 NOTE — Progress Notes (Signed)
[name], [name] ([id number]) Visit Report for 04/17/2018 Arrival Information Details Patient Name: [name], [name]. Date of Service: 04/17/2018 11:00 AM Medical Record Number: [id number] Patient Account Number: [id number] Date of Birth/Sex: 22-Nov-1930 (84 y.o. M) Treating RN: Montey Hora Primary Care Brennon Otterness: Burman Freestone Other Clinician: Referring Siera Beyersdorf: Burman Freestone Treating Shirleen Mcfaul/Extender: [name], HOYT Weeks in Treatment: 10 Visit Information History Since Last Visit Added or deleted any medications: No Patient Arrived: Cane Any new allergies or adverse reactions: No Arrival Time: 11:07 Had a fall or experienced change in No Accompanied By: daughter activities of daily living that may affect Transfer Assistance: None risk of falls: Patient Identification Verified: Yes Signs or symptoms of abuse/neglect since last visito No Secondary Verification Process Completed: Yes Hospitalized since last visit: No Patient Requires Transmission-Based Precautions: No Implantable device outside of the clinic excluding No Patient Has Alerts: Yes cellular tissue based products placed in the center since last visit: Has Dressing in Place as Prescribed: Yes Pain Present Now: No Electronic Signature(s) Signed: 04/17/2018 4:10:25 PM By: Montey Hora Entered By: Montey Hora on 04/17/2018 11:08:11 [name] ([id number]) -------------------------------------------------------------------------------- Encounter Discharge Information Details Patient Name: [name] Date of Service: 04/17/2018 11:00 AM Medical Record Number: [id number] Patient Account Number: [id number] Date of Birth/Sex: February 03, 1931 (84 y.o. M) Treating RN: Roger Shelter Primary Care Cleatus Goodin: Burman Freestone Other Clinician: Referring Caitlan Chauca: Burman Freestone Treating Lakea Mittelman/Extender: [name], HOYT Weeks in Treatment: 10 Encounter Discharge Information Items Discharge Condition:  Stable Ambulatory Status: Cane Discharge Destination: Home Transportation: Private Auto Accompanied By: daughter Schedule Follow-up Appointment: Yes Clinical Summary of Care: Electronic Signature(s) Signed: 04/18/2018 3:34:51 PM By: Roger Shelter Entered By: Roger Shelter on 04/17/2018 11:40:32 [name] ([id number]) -------------------------------------------------------------------------------- Lower Extremity Assessment Details Patient Name: [name] Date of Service: 04/17/2018 11:00 AM Medical Record Number: [id number] Patient Account Number: [id number] Date of Birth/Sex: 04-11-1931 (84 y.o. M) Treating RN: Montey Hora Primary Care Taylr Meuth: Burman Freestone Other Clinician: Referring Teiana Hajduk: Burman Freestone Treating Eithel Ryall/Extender: [name], HOYT Weeks in Treatment: 10 Electronic Signature(s) Signed: 04/17/2018 4:10:25 PM By: Montey Hora Entered By: Montey Hora on 04/17/2018 11:15:07 [name] ([id number]) -------------------------------------------------------------------------------- Multi Wound Chart Details Patient Name: [name] Date of Service: 04/17/2018 11:00 AM Medical Record Number: [id number] Patient Account Number: [id number] Date of Birth/Sex: 1931-02-12 (84 y.o. M) Treating RN: Ahmed Prima Primary Care Shantavia Jha: Burman Freestone Other Clinician: Referring Wilbon Obenchain: Burman Freestone Treating Taiana Temkin/Extender: [name], HOYT Weeks in Treatment: 10 Vital Signs Height(in): 69 Pulse(bpm): 70 Weight(lbs): 155 Blood Pressure(mmHg): 141/87 Body Mass Index(BMI): 23 Temperature(F): 98.7 Respiratory Rate 18 (breaths/min): Photos: [1:No Photos] [N/A:N/A] Wound Location: [1:Sacrum] [N/A:N/A] Wounding Event: [1:Pressure Injury] [N/A:N/A] Primary Etiology: [1:Pressure Ulcer] [N/A:N/A] Comorbid History: [1:Cataracts, Asthma, Angina, Arrhythmia, Coronary Artery Disease, Hypertension, Myocardial Infarction,  Osteoarthritis, Dementia, Neuropathy] [N/A:N/A] Date Acquired: [1:01/17/2018] [N/A:N/A] Weeks of Treatment: [1:10] [N/A:N/A] Wound Status: [1:Open] [N/A:N/A] Measurements L x W x D [1:2.4x1.7x0.6] [N/A:N/A] (cm) Area (cm) : [1:3.204] [N/A:N/A] Volume (cm) : [1:1.923] [N/A:N/A] % Reduction in Area: [1:84.30%] [N/A:N/A] % Reduction in Volume: [1:95.30%] [N/A:N/A] Starting Position 1 [1:2] (o'clock): Ending Position 1 [1:5] (o'clock): Maximum Distance 1 (cm): [1:0.3] Undermining: [1:Yes] [N/A:N/A] Classification: [1:Category/Stage III] [N/A:N/A] Exudate Amount: [1:Large] [N/A:N/A] Exudate Type: [1:Purulent] [N/A:N/A] Exudate Color: [1:yellow, brown, green] [N/A:N/A] Wound Margin: [1:Distinct, outline attached] [N/A:N/A] Granulation Amount: [1:Medium (34-66%)] [N/A:N/A] Granulation Quality: [1:Pink, Hyper-granulation] [N/A:N/A] Necrotic Amount: [1:Medium (34-66%)] [N/A:N/A] Exposed Structures: [1:Fat Layer (Subcutaneous Tissue) Exposed: Yes Fascia: No Tendon: No] [N/A:N/A] Muscle: No Joint: No Bone:  No Epithelialization: None N/A N/A Periwound Skin Texture: Excoriation: Yes N/A N/A Induration: No Callus: No Crepitus: No Rash: No Scarring: No Periwound Skin Moisture: Maceration: No N/A N/A Dry/Scaly: No Periwound Skin Color: Erythema: Yes N/A N/A Atrophie Blanche: No Cyanosis: No Ecchymosis: No Hemosiderin Staining: No Mottled: No Pallor: No Rubor: No Erythema Location: Circumferential N/A N/A Temperature: No Abnormality N/A N/A Tenderness on Palpation: Yes N/A N/A Wound Preparation: Ulcer Cleansing: N/A N/A Rinsed/Irrigated with Saline Topical Anesthetic Applied: Other: lidocaine 4% Treatment Notes Electronic Signature(s) Signed: 04/17/2018 4:10:39 PM By: Alric Quan Entered By: Alric Quan on 04/17/2018 11:24:43 [name] ([id number]) -------------------------------------------------------------------------------- [place] Details Patient Name: [name], [name]. Date of Service: 04/17/2018 11:00 AM Medical Record Number: [id number] Patient Account Number: [id number] Date of Birth/Sex: [birth year] (84 y.o. M) Treating RN: Ahmed Prima Primary Care Hanif Radin: Burman Freestone Other Clinician: Referring Baylen Dea: Burman Freestone Treating Makylee Sanborn/Extender: [name], HOYT Weeks in Treatment: 10 Active Inactive ` Abuse / Safety / Falls / Self Care Management Nursing Diagnoses: History of Falls Goals: Patient will not experience any injury related to falls Date Initiated: 02/06/2018 Target Resolution Date: 03/08/2018 Goal Status: Active Interventions: Assess fall risk on admission and as needed Treatment Activities: Patient referred to home care : 02/06/2018 Notes: ` Necrotic Tissue Nursing Diagnoses: Impaired tissue integrity related to necrotic/devitalized tissue Goals: Necrotic/devitalized tissue will be minimized in the wound bed Date Initiated: 02/06/2018 Target Resolution Date: 03/08/2018 Goal Status: Active Interventions: Assess patient pain level pre-, during and post procedure and prior to discharge Treatment Activities: Excisional debridement : 02/06/2018 Notes: ` Nutrition Nursing Diagnoses: Potential for alteratiion in Nutrition/Potential for imbalanced nutrition [name], [name] ([id number]) Goals: Patient/caregiver agrees to and verbalizes understanding of need to obtain nutritional consultation Date Initiated: 02/06/2018 Target Resolution Date: 03/08/2018 Goal Status: Active Interventions: Provide education on nutrition Notes: ` Orientation to the Wound Care Program Nursing Diagnoses: Knowledge deficit related to the wound healing center program Goals: Patient/caregiver will verbalize understanding of the [facility] Date Initiated: 02/06/2018 Target Resolution Date: 03/08/2018 Goal Status: Active Interventions: Provide education on orientation to the  wound center Notes: ` Pressure Nursing Diagnoses: Knowledge deficit related to management of pressures ulcers Goals: Patient/caregiver will verbalize understanding of pressure ulcer management Date Initiated: 02/06/2018 Target Resolution Date: 03/08/2018 Goal Status: Active Interventions: Assess offloading mechanisms upon admission and as needed Provide education on pressure ulcers Notes: ` Wound/Skin Impairment Nursing Diagnoses: Knowledge deficit related to ulceration/compromised skin integrity Goals: Ulcer/skin breakdown will have a volume reduction of 80% by week 12 Date Initiated: 02/06/2018 Target Resolution Date: 04/08/2018 Goal Status: Active [name], [name] ([id number]) Interventions: Assess ulceration(s) every visit Treatment Activities: Skin care regimen initiated : 02/06/2018 Topical wound management initiated : 02/06/2018 Notes: Electronic Signature(s) Signed: 04/17/2018 4:10:39 PM By: Alric Quan Entered By: Alric Quan on 04/17/2018 11:24:30 [name] ([id number]) -------------------------------------------------------------------------------- Pain Assessment Details Patient Name: [name] Date of Service: 04/17/2018 11:00 AM Medical Record Number: [id number] Patient Account Number: [id number] Date of Birth/Sex: Jan 29, 1931 (84 y.o. M) Treating RN: Montey Hora Primary Care Audri Kozub: Burman Freestone Other Clinician: Referring Harmoni Lucus: Burman Freestone Treating Janylah Belgrave/Extender: [name], HOYT Weeks in Treatment: 10 Active Problems Location of Pain Severity and Description of Pain Patient Has Paino No Site Locations Pain Management and Medication Current Pain Management: Electronic Signature(s) Signed: 04/17/2018 4:10:25 PM By: Montey Hora Entered By: Montey Hora on 04/17/2018 11:08:20 [name] ([id number]) -------------------------------------------------------------------------------- Patient/Caregiver Education  Details Patient Name: [name] Date  of Service: 04/17/2018 11:00 AM Medical Record Number: [id number] Patient Account Number: [id number] Date of Birth/Gender: 05-Jan-1931 (84 y.o. M) Treating RN: Roger Shelter Primary Care Physician: Burman Freestone Other Clinician: Referring Physician: Burman Freestone Treating Physician/Extender: Sharalyn Ink in Treatment: 10 Education Assessment Education Provided To: Patient Education Topics Provided Wound/Skin Impairment: Handouts: Caring for Your Ulcer Methods: Explain/Verbal Responses: State content correctly Electronic Signature(s) Signed: 04/18/2018 3:34:51 PM By: Roger Shelter Entered By: Roger Shelter on 04/17/2018 11:40:47 [name] ([id number]) -------------------------------------------------------------------------------- Wound Assessment Details Patient Name: [name] Date of Service: 04/17/2018 11:00 AM Medical Record Number: [id number] Patient Account Number: [id number] Date of Birth/Sex: December 20, 1930 (84 y.o. M) Treating RN: Montey Hora Primary Care Brytni Dray: Burman Freestone Other Clinician: Referring Veniamin Kincaid: Burman Freestone Treating Milburn Freeney/Extender: [name], HOYT Weeks in Treatment: 10 Wound Status Wound Number: 1 Primary Pressure Ulcer Etiology: Wound Location: Sacrum Wound Open Wounding Event: Pressure Injury Status: Date Acquired: 01/17/2018 Comorbid Cataracts, Asthma, Angina, Arrhythmia, Weeks Of Treatment: 10 History: Coronary Artery Disease, Hypertension, Clustered Wound: No Myocardial Infarction, Osteoarthritis, Dementia, Neuropathy Photos Photo Uploaded By: Montey Hora on 04/17/2018 13:05:40 Wound Measurements Length: (cm) 2.4 Width: (cm) 1.7 Depth: (cm) 0.6 Area: (cm) 3.204 Volume: (cm) 1.923 % Reduction in Area: 84.3% % Reduction in Volume: 95.3% Epithelialization: None Tunneling: No Undermining: Yes Starting Position (o'clock): 2 Ending Position  (o'clock): 5 Maximum Distance: (cm) 0.3 Wound Description Classification: Category/Stage III Wound Margin: Distinct, outline attached Exudate Amount: Large Exudate Type: Purulent Exudate Color: yellow, brown, green Foul Odor After Cleansing: No Slough/Fibrino Yes Wound Bed Granulation Amount: Medium (34-66%) Exposed Structure Granulation Quality: Pink, Hyper-granulation Fascia Exposed: No Necrotic Amount: Medium (34-66%) Fat Layer (Subcutaneous Tissue) Exposed: Yes Necrotic Quality: Adherent Slough Tendon Exposed: No [name], [name] ([id number]) Muscle Exposed: No Joint Exposed: No Bone Exposed: No Periwound Skin Texture Texture Color No Abnormalities Noted: No No Abnormalities Noted: No Callus: No Atrophie Blanche: No Crepitus: No Cyanosis: No Excoriation: Yes Ecchymosis: No Induration: No Erythema: Yes Rash: No Erythema Location: Circumferential Scarring: No Hemosiderin Staining: No Mottled: No Moisture Pallor: No No Abnormalities Noted: No Rubor: No Dry / Scaly: No Maceration: No Temperature / Pain Temperature: No Abnormality Tenderness on Palpation: Yes Wound Preparation Ulcer Cleansing: Rinsed/Irrigated with Saline Topical Anesthetic Applied: Other: lidocaine 4%, Treatment Notes Wound #1 (Sacrum) 1. Cleansed with: Clean wound with Normal Saline 2. Anesthetic Topical Lidocaine 4% cream to wound bed prior to debridement 5. Secondary Dressing Applied Bordered Foam Dressing Notes wet to dry saline gauze dressing Electronic Signature(s) Signed: 04/17/2018 4:10:25 PM By: Montey Hora Entered By: Montey Hora on 04/17/2018 11:14:51 [name] ([id number]) -------------------------------------------------------------------------------- Vitals Details Patient Name: [name] Date of Service: 04/17/2018 11:00 AM Medical Record Number: [id number] Patient Account Number: [id number] Date of Birth/Sex: September 21, 1931 (84 y.o. M) Treating RN:  Montey Hora Primary Care Traci Plemons: Burman Freestone Other Clinician: Referring Edras Wilford: Burman Freestone Treating Jillene Wehrenberg/Extender: [name], HOYT Weeks in Treatment: 10 Vital Signs Time Taken: 11:08 Temperature (F): 98.7 Height (in): 69 Pulse (bpm): 70 Weight (lbs): 155 Respiratory Rate (breaths/min): 18 Body Mass Index (BMI): 22.9 Blood Pressure (mmHg): 141/87 Reference Range: 80 - 120 mg / dl Electronic Signature(s) Signed: 04/17/2018 4:10:25 PM By: Montey Hora Entered By: Montey Hora on 04/17/2018 11:08:44

## 2018-04-23 ENCOUNTER — Encounter: Payer: Medicare Other | Admitting: Physician Assistant

## 2018-04-23 DIAGNOSIS — L89154 Pressure ulcer of sacral region, stage 4: Secondary | ICD-10-CM | POA: Diagnosis not present

## 2018-04-24 NOTE — Progress Notes (Signed)
FIONN, STRACKE ([id number]) Visit Report for 04/23/2018 Chief Complaint Document Details Patient Name: [name], [name]. Date of Service: 04/23/2018 3:30 PM Medical Record Number: [id number] Patient Account Number: [id number] Date of Birth/Sex: [birth year] (84 y.o. M) Treating RN: Roger Shelter Primary Care Provider: Burman Freestone Other Clinician: Referring Provider: Burman Freestone Treating Provider/Extender: [name], HOYT Weeks in Treatment: 10 Information Obtained from: Patient Chief Complaint Sacral pressure ulcer and left shoulder rash Electronic Signature(s) Signed: 04/23/2018 8:46:34 PM By: Worthy Keeler PA-C Entered By: Worthy Keeler on 04/23/2018 16:20:09 [name] ([id number]) -------------------------------------------------------------------------------- Debridement Details Patient Name: [name] Date of Service: 04/23/2018 3:30 PM Medical Record Number: [id number] Patient Account Number: [id number] Date of Birth/Sex: [birth year] (84 y.o. M) Treating RN: Roger Shelter Primary Care Provider: Burman Freestone Other Clinician: Referring Provider: Burman Freestone Treating Provider/Extender: [name], HOYT Weeks in Treatment: 10 Debridement Performed for Wound #1 Sacrum Assessment: Performed By: Physician STONE III, HOYT E., PA-C Debridement Type: Debridement Pre-procedure Verification/Time Yes - 16:35 Out Taken: Start Time: 16:35 Pain Control: Other : lidocaine 4% Total Area Debrided (L x W): 2.3 (cm) x 1.7 (cm) = 3.91 (cm) Tissue and other material Viable, Non-Viable, Slough, Subcutaneous, Biofilm, Slough debrided: Level: Skin/Subcutaneous Tissue Debridement Description: Excisional Instrument: Curette Bleeding: Minimum Hemostasis Achieved: Pressure End Time: 16:39 Procedural Pain: 0 Post Procedural Pain: 0 Response to Treatment: Procedure was tolerated well Level of Consciousness: Awake and Alert Post Debridement Measurements of  Total Wound Length: (cm) 2.3 Stage: Category/Stage III Width: (cm) 1.7 Depth: (cm) 0.9 Volume: (cm) 2.764 Character of Wound/Ulcer Post Stable Debridement: Post Procedure Diagnosis Same as Pre-procedure Electronic Signature(s) Signed: 04/23/2018 8:46:34 PM By: Worthy Keeler PA-C Signed: 04/24/2018 11:46:23 AM By: Roger Shelter Entered By: Roger Shelter on 04/23/2018 16:40:00 [name] ([id number]) -------------------------------------------------------------------------------- HPI Details Patient Name: [name] Date of Service: 04/23/2018 3:30 PM Medical Record Number: [id number] Patient Account Number: [id number] Date of Birth/Sex: [birth year] (84 y.o. M) Treating RN: Roger Shelter Primary Care Provider: Burman Freestone Other Clinician: Referring Provider: Burman Freestone Treating Provider/Extender: [name], HOYT Weeks in Treatment: 10 History of Present Illness HPI Description: 02/06/18 on evaluation today patient presents for initial evaluation and our clinic concerning an issue which began roughly 3 weeks ago when the patient fell in his home on the floor in his kitchen and laid him down this detergent for roughly 3 days. He had a pressure injury to the left shoulder. This unfortunately has caused him a lot of discomfort although it finally seems to be doing better if anything is really having a lot of itching right now. This appears potentially be a contact dermatitis issue. He also has a significant pressure injury to the sacrum at this time as well which is also showing fascia exposure right over the bone but no evidence of bone exposure at this point which is good news. They have been using Santyl as well as Saline soaked gauze at this point in time. There does appear to be a lot of necrotic slough in the base of the wound. He does have a history of incontinence, myocardial infarction, and hypertension. He also is "borderline diabetic" hemoglobin A1c  of 6.0. Currently he has some discomfort in the pressure site at the sacrum but fortunately nothing too significant this did require sharp debridement today. 02/13/18 on evaluation today patient appears to be doing much better in regard to his sacral wound. He has been tolerating the dressing changes without complication with the Vashe.  Fortunately there is no evidence of infection and though there is some Slough on the surface of the wound bed he has excellent granulation noted. Overall I'm pleased with how things have progressed in that regard. A glance at his shoulder as well and the rash seems to be someone improving in my pinion at this site as well. Overall I am pleased with what we're seeing and so is the family. 02/20/18 on evaluation today patient appears to be doing a little worse in regard to the sacral wound only in the fact that there is redness surrounding it has me somewhat concerned for infection. The drainage has also apparently been a little bit off color compared to normal according to family they did keep the dressing today that was removed to show me and I agree this seems to be a little bit different compared to what we have been seeing. Coupled with the redness I'm concerned he may be developing some cellulitis surrounding the wound bed. 02/27/18 on evaluation today patient presents for follow-up concerning his sacral ulcer. We have received the results back from his wound culture which shows unfortunately that the doxycycline will not be of benefit for him I am going to need to initiate treatment with something else in order to treat the pseudomonas. Otherwise he does not seem to be having any significant pain although his daughter states there are sometimes when he states having pain. We continue to use the Vashe currently. 03/06/18 on evaluation today patient's sacral wound appears to be doing better in my opinion. He has been tolerating the dressing changes without  complication. With that being said the silver nitrate has helped with the prominent area of hyper granulation at the 6 o'clock location we will likely need to repeat this again today. Nonetheless overall I am pleased with how things have improved over the last week. The erythema surrounding the wound seems to be greatly improved. 03/13/18 on evaluation today patient's wound actually does not appear to be terribly infected although she does continue to have erythema surrounding the wound bed especially on the left border. I'm still somewhat concerned about the fact that the oral antibiotics alone may not be completely treating his infection. I previously discussed may need to go for IV antibiotic therapy I'm concerned that may be the case. We will need to make a referral today for infectious disease. 03/20/18 on evaluation today the patient sacral wound actually appears to be doing fairly well in regard to granulation although he continues unfortunately to have it your theme is surrounding the periwound region. There's also some increased swelling at the 6 o'clock location which also has me somewhat concerned. With that being said he does have some discomfort but nothing too significant at this point. He still has not heard from infectious disease his daughter and wife are both present during the office visit today they're going to check back with this again. We did get the information for them to call them today. 03/27/18 on evaluation today patient is seen concerning his ongoing sacral ulcer. He has been tolerating the dressing changes without complication. With that being said he does present with evidence of bright green drainage noted on the dressing which again is something that I do often expect to see with a pseudomonas infection. He continues to have your theme is EMAURI, KRYGIER ([id number]) surrounding the wound bed as well and again I'm not 100% convinced this is just pressure related. I did  speak with Colletta Maryland who  is the nurse practitioner in [place] with infectious disease. I spoke with her actually yesterday concerning this patient. She is not 100% convinced that this is infected. She question whether the wound may just be colonized with Pseudomonas and not actually causing an active infection. I am really not thinking that the edema is associated with pressure alone and again overall I don't feel that your theme and is consistent with a pressure injury either as he's never had any contusions noted like a deep tissue injury on his heel which was new and I did visualize today this was on the left heel. Nonetheless she wanted to give this a little bit more time and thought it would be appropriate to start the Wound VAC at this point. 04/03/18 on evaluation today patient sacral ulcer actually appears to be doing fairly well at this point. He has been tolerating the dressing changes without complication. With that being said I'm very pleased with the progress that has been made in regard to his sacral wound over the past week I do not see as much in the way of erythema which is great news. Nonetheless he does have a small area of hyper granulation unfortunately. This is at roughly the 7 o'clock location and I think does need to be addressed so that this will heal more appropriately. Nonetheless I think we may be ready to go ahead and initiate therapy with the Wound VAC. 04/10/18 on evaluation today patient appears to be doing excellent in regard to his sacral ulcer. The show signs of great improvement in overall I'm very pleased with how things look. He has been tolerating the dressing changes without complication. Specifically this is the Wound VAC. He also seems to be doing well with the antibiotic there is decreased your theme and redness surrounding the sacral area at this point in the wound has filled in quite significantly. 04/17/18 on evaluation today patient actually appears to  be doing excellent in regard to his sacral ulcer. He's been tolerating the dressing changes without complication specifically the Wound VAC. There really are no major concerns from the patient nor family this point he is having no pain. He does have a little bit of Epiboly on the lateral portions of the wound where he does have a little bit more depth that will need to be addressed today. 04/23/18 on evaluation today patient's wound actually appears to be doing excellent at this point. He has been tolerating the Wound VAC and this appears to be doing well other than the fact that it seems to be breaking seal at the 6 o'clock location. I do believe that adding a duodenum dressing at this location try and help maintain the seal would be appropriate and likely very effective. With that being said he overall seems to be showing signs of good improvement at this point. There does not appear to be any evidence of significant infection which is also excellent news. Electronic Signature(s) Signed: 04/23/2018 8:46:34 PM By: Worthy Keeler PA-C Entered By: Worthy Keeler on 04/23/2018 19:51:35 [name] ([id number]) -------------------------------------------------------------------------------- Physical Exam Details Patient Name: [name], [name] Date of Service: 04/23/2018 3:30 PM Medical Record Number: [id number] Patient Account Number: [id number] Date of Birth/Sex: 30-Nov-1930 (84 y.o. M) Treating RN: Roger Shelter Primary Care Provider: Burman Freestone Other Clinician: Referring Provider: Burman Freestone Treating Provider/Extender: STONE III, HOYT Weeks in Treatment: 10 Constitutional Well-nourished and well-hydrated in no acute distress. Respiratory normal breathing without difficulty. Psychiatric this patient is able to  make decisions and demonstrates good insight into disease process. Alert and Oriented x 3. pleasant and cooperative. Notes Patient's wound bed at this point shows  good granulation which is exactly what we want to see. There does not appear to be any evidence of skin breakdown and worsening in general which is also good news. Overall I'm very happy at this time and he seems to be again filling in and granulating very well. Electronic Signature(s) Signed: 04/23/2018 8:46:34 PM By: Worthy Keeler PA-C Entered By: Worthy Keeler on 04/23/2018 19:52:36 Tomasini, [name] ([id number]) -------------------------------------------------------------------------------- Physician Orders Details Patient Name: [name], [name] Date of Service: 04/23/2018 3:30 PM Medical Record Number: [id number] Patient Account Number: [id number] Date of Birth/Sex: [birth year] (84 y.o. M) Treating RN: Roger Shelter Primary Care Provider: Burman Freestone Other Clinician: Referring Provider: Burman Freestone Treating Provider/Extender: [name], HOYT Weeks in Treatment: 10 Verbal / Phone Orders: No Diagnosis Coding ICD-10 Coding Code Description L89.154 Pressure ulcer of sacral region, stage 4 L24.0 Irritant contact dermatitis due to detergents Wound Cleansing Wound #1 Sacrum o Clean wound with Normal Saline. Anesthetic (add to Medication List) Wound #1 Sacrum o Topical Lidocaine 4% cream applied to wound bed prior to debridement (In Clinic Only). Skin Barriers/Peri-Wound Care Wound #1 Sacrum o Skin Prep Dressing Change Frequency Wound #1 Sacrum o Change Dressing Monday, Wednesday, Friday - HHRN to go out to pts home MONDAYS after his appointment with [facility] to put the wound vac back on and change wound vac on [facility] and [place]IDAYS. Follow-up Appointments Wound #1 Sacrum o Return Appointment in 1 week. Off-Loading Wound #1 Sacrum o Mattress - hospital bed o Turn and reposition every 2 hours o Other: - please make sure the pt is floating his heels while lying in bed Additional Orders / Instructions Wound #1 Sacrum o Increase protein  intake. Home Health Wound #1 [place] [facility] - [facility] to go out to pts home MONDAYS after his appointment with [facility] to put the wound vac back on and change wound vac on [facility] and [place]. TERRYN, REDNER ([id number]) o Home Health Nurse may visit PRN to address patientos wound care needs. o FACE TO FACE ENCOUNTER: MEDICARE and MEDICAID PATIENTS: I certify that this patient is under my care and that I had a face-to-face encounter that meets the physician face-to-face encounter requirements with this patient on this date. The encounter with the patient was in whole or in part for the following MEDICAL CONDITION: (primary reason for [place]) MEDICAL NECESSITY: I certify, that based on my findings, NURSING services are a medically necessary home health service. HOME BOUND STATUS: I certify that my clinical findings support that this patient is homebound (i.e., Due to illness or injury, pt requires aid of supportive devices such as crutches, cane, wheelchairs, walkers, the use of special transportation or the assistance of another person to leave their place of residence. There is a normal inability to leave the home and doing so requires considerable and taxing effort. Other absences are for medical reasons / religious services and are infrequent or of short duration when for other reasons). o If current dressing causes regression in wound condition, may D/C ordered dressing product/s and apply Normal Saline Moist Dressing daily until next [place] / Other MD appointment. [place] of regression in wound condition at [phone]. o Please direct any NON-WOUND related issues/requests for orders to patient's Primary Care Physician Negative Pressure Wound  Therapy Wound #1 Sacrum o Wound VAC settings at 125/130 mmHg continuous pressure. Use BLACK/GREEN foam to wound cavity. Use WHITE foam to fill any  tunnel/s and/or undermining. Change VAC dressing 3 X WEEK. Change canister as indicated when full. Nurse may titrate settings and frequency of dressing changes as clinically indicated. - provider request for [facility] to apply duoderm dressing at base (6 oclock area) under the wound vac dressing in order to assist with wound vac dressing to adhere properly . If you have questions plaease call the wound care clinic. o Home Health Nurse may d/c VAC for s/s of increased infection, significant wound regression, or uncontrolled drainage. [place] at [phone]. o Number of foam/gauze pieces used in the dressing = Electronic Signature(s) Signed: 04/23/2018 8:46:34 PM By: Worthy Keeler PA-C Signed: 04/24/2018 11:46:23 AM By: Roger Shelter Entered By: Roger Shelter on 04/23/2018 16:37:07 [name] ([id number]) -------------------------------------------------------------------------------- Problem List Details Patient Name: [name], [name]. Date of Service: 04/23/2018 3:30 PM Medical Record Number: [id number] Patient Account Number: [id number] Date of Birth/Sex: 1930-10-26 (84 y.o. M) Treating RN: Roger Shelter Primary Care Provider: Burman Freestone Other Clinician: Referring Provider: Burman Freestone Treating Provider/Extender: [name], HOYT Weeks in Treatment: 10 Active Problems ICD-10 Evaluated Encounter Code Description Active Date Today Diagnosis L89.154 Pressure ulcer of sacral region, stage 4 02/06/2018 No Yes L24.0 Irritant contact dermatitis due to detergents 02/06/2018 No Yes Inactive Problems Resolved Problems Electronic Signature(s) Signed: 04/23/2018 8:46:34 PM By: Worthy Keeler PA-C Entered By: Worthy Keeler on 04/23/2018 16:20:02 [name] ([id number]) -------------------------------------------------------------------------------- Progress Note Details Patient Name: [name] Date of Service: 04/23/2018 3:30 PM Medical  Record Number: [id number] Patient Account Number: [id number] Date of Birth/Sex: [birth year] (84 y.o. M) Treating RN: Roger Shelter Primary Care Provider: Burman Freestone Other Clinician: Referring Provider: Burman Freestone Treating Provider/Extender: [name], HOYT Weeks in Treatment: 10 Subjective Chief Complaint Information obtained from Patient Sacral pressure ulcer and left shoulder rash History of Present Illness (HPI) 02/06/18 on evaluation today patient presents for initial evaluation and our clinic concerning an issue which began roughly 3 weeks ago when the patient fell in his home on the floor in his kitchen and laid him down this detergent for roughly 3 days. He had a pressure injury to the left shoulder. This unfortunately has caused him a lot of discomfort although it finally seems to be doing better if anything is really having a lot of itching right now. This appears potentially be a contact dermatitis issue. He also has a significant pressure injury to the sacrum at this time as well which is also showing fascia exposure right over the bone but no evidence of bone exposure at this point which is good news. They have been using Santyl as well as Saline soaked gauze at this point in time. There does appear to be a lot of necrotic slough in the base of the wound. He does have a history of incontinence, myocardial infarction, and hypertension. He also is "borderline diabetic" hemoglobin A1c of 6.0. Currently he has some discomfort in the pressure site at the sacrum but fortunately nothing too significant this did require sharp debridement today. 02/13/18 on evaluation today patient appears to be doing much better in regard to his sacral wound. He has been tolerating the dressing changes without complication with the Vashe. Fortunately there is no evidence of infection and though there is some Slough on the surface of the wound bed he has excellent granulation noted.  Overall I'm  pleased with how things have progressed in that regard. A glance at his shoulder as well and the rash seems to be someone improving in my pinion at this site as well. Overall I am pleased with what we're seeing and so is the family. 02/20/18 on evaluation today patient appears to be doing a little worse in regard to the sacral wound only in the fact that there is redness surrounding it has me somewhat concerned for infection. The drainage has also apparently been a little bit off color compared to normal according to family they did keep the dressing today that was removed to show me and I agree this seems to be a little bit different compared to what we have been seeing. Coupled with the redness I'm concerned he may be developing some cellulitis surrounding the wound bed. 02/27/18 on evaluation today patient presents for follow-up concerning his sacral ulcer. We have received the results back from his wound culture which shows unfortunately that the doxycycline will not be of benefit for him I am going to need to initiate treatment with something else in order to treat the pseudomonas. Otherwise he does not seem to be having any significant pain although his daughter states there are sometimes when he states having pain. We continue to use the Vashe currently. 03/06/18 on evaluation today patient's sacral wound appears to be doing better in my opinion. He has been tolerating the dressing changes without complication. With that being said the silver nitrate has helped with the prominent area of hyper granulation at the 6 o'clock location we will likely need to repeat this again today. Nonetheless overall I am pleased with how things have improved over the last week. The erythema surrounding the wound seems to be greatly improved. 03/13/18 on evaluation today patient's wound actually does not appear to be terribly infected although she does continue to have erythema surrounding the wound bed especially on  the left border. I'm still somewhat concerned about the fact that the oral antibiotics alone may not be completely treating his infection. I previously discussed may need to go for IV antibiotic therapy I'm concerned that may be the case. We will need to make a referral today for infectious disease. 03/20/18 on evaluation today the patient sacral wound actually appears to be doing fairly well in regard to granulation although he continues unfortunately to have it your theme is surrounding the periwound region. There's also some increased swelling at the 6 o'clock location which also has me somewhat concerned. With that being said he does have some discomfort but [name], [name]. ([id number]) nothing too significant at this point. He still has not heard from infectious disease his daughter and wife are both present during the office visit today they're going to check back with this again. We did get the information for them to call them today. 03/27/18 on evaluation today patient is seen concerning his ongoing sacral ulcer. He has been tolerating the dressing changes without complication. With that being said he does present with evidence of bright green drainage noted on the dressing which again is something that I do often expect to see with a pseudomonas infection. He continues to have your theme is surrounding the wound bed as well and again I'm not 100% convinced this is just pressure related. I did speak with Colletta Maryland who is the nurse practitioner in [place] with infectious disease. I spoke with her actually yesterday concerning this patient. She is not 100% convinced that this  is infected. She question whether the wound may just be colonized with Pseudomonas and not actually causing an active infection. I am really not thinking that the edema is associated with pressure alone and again overall I don't feel that your theme and is consistent with a pressure injury either as he's never had  any contusions noted like a deep tissue injury on his heel which was new and I did visualize today this was on the left heel. Nonetheless she wanted to give this a little bit more time and thought it would be appropriate to start the Wound VAC at this point. 04/03/18 on evaluation today patient sacral ulcer actually appears to be doing fairly well at this point. He has been tolerating the dressing changes without complication. With that being said I'm very pleased with the progress that has been made in regard to his sacral wound over the past week I do not see as much in the way of erythema which is great news. Nonetheless he does have a small area of hyper granulation unfortunately. This is at roughly the 7 o'clock location and I think does need to be addressed so that this will heal more appropriately. Nonetheless I think we may be ready to go ahead and initiate therapy with the Wound VAC. 04/10/18 on evaluation today patient appears to be doing excellent in regard to his sacral ulcer. The show signs of great improvement in overall I'm very pleased with how things look. He has been tolerating the dressing changes without complication. Specifically this is the Wound VAC. He also seems to be doing well with the antibiotic there is decreased your theme and redness surrounding the sacral area at this point in the wound has filled in quite significantly. 04/17/18 on evaluation today patient actually appears to be doing excellent in regard to his sacral ulcer. He's been tolerating the dressing changes without complication specifically the Wound VAC. There really are no major concerns from the patient nor family this point he is having no pain. He does have a little bit of Epiboly on the lateral portions of the wound where he does have a little bit more depth that will need to be addressed today. 04/23/18 on evaluation today patient's wound actually appears to be doing excellent at this point. He has been  tolerating the Wound VAC and this appears to be doing well other than the fact that it seems to be breaking seal at the 6 o'clock location. I do believe that adding a duodenum dressing at this location try and help maintain the seal would be appropriate and likely very effective. With that being said he overall seems to be showing signs of good improvement at this point. There does not appear to be any evidence of significant infection which is also excellent news. Patient History Information obtained from Patient. Family History Cancer - Paternal Grandparents, Diabetes - Father, Heart Disease - Mother,Father, Stroke - Father, No family history of Hypertension, Kidney Disease, Lung Disease, Seizures, Thyroid Problems, Tuberculosis. Social History Never smoker, Marital Status - Widowed, Alcohol Use - Never, Drug Use - No History, Caffeine Use - Daily. Medical History Hospitalization/Surgery History - 01/20/2018, [facility]. Medical And Surgical History Notes Endocrine Borderline Oncologic Melanoma on back Review of Systems (ROS) AUNDREA, HIGGINBOTHAM ([id number]) Constitutional Symptoms (General Health) Denies complaints or symptoms of Fever, Chills. Respiratory The patient has no complaints or symptoms. Cardiovascular The patient has no complaints or symptoms. Psychiatric The patient has no complaints or symptoms. Objective Constitutional  Well-nourished and well-hydrated in no acute distress. Vitals Time Taken: 3:58 PM, Height: 69 in, Weight: 155 lbs, BMI: 22.9, Temperature: 98.3 F, Pulse: 61 bpm, Respiratory Rate: 18 breaths/min, Blood Pressure: 137/76 mmHg. Respiratory normal breathing without difficulty. Psychiatric this patient is able to make decisions and demonstrates good insight into disease process. Alert and Oriented x 3. pleasant and cooperative. General Notes: Patient's wound bed at this point shows good granulation which is exactly what we want to see. There does not  appear to be any evidence of skin breakdown and worsening in general which is also good news. Overall I'm very happy at this time and he seems to be again filling in and granulating very well. Integumentary (Hair, Skin) Wound #1 status is Open. Original cause of wound was Pressure Injury. The wound is located on the Sacrum. The wound measures 2.3cm length x 1.7cm width x 0.9cm depth; 3.071cm^2 area and 2.764cm^3 volume. There is Fat Layer (Subcutaneous Tissue) Exposed exposed. There is no tunneling noted, however, there is undermining starting at 1:00 and ending at 2:00 with a maximum distance of 0.6cm. There is a large amount of purulent drainage noted. The wound margin is distinct with the outline attached to the wound base. There is no granulation within the wound bed. There is a large (67-100%) amount of necrotic tissue within the wound bed including Adherent Slough. The periwound skin appearance exhibited: Excoriation, Erythema. The periwound skin appearance did not exhibit: Callus, Crepitus, Induration, Rash, Scarring, Dry/Scaly, Maceration, Atrophie Blanche, Cyanosis, Ecchymosis, Hemosiderin Staining, Mottled, Pallor, Rubor. The surrounding wound skin color is noted with erythema which is circumferential. Periwound temperature was noted as No Abnormality. The periwound has tenderness on palpation. Assessment Active Problems ICD-10 [name], [name] ([id number]) Pressure ulcer of sacral region, stage 4 Irritant contact dermatitis due to detergents Procedures Wound #1 Pre-procedure diagnosis of Wound #1 is a Pressure Ulcer located on the Sacrum . There was a Excisional Skin/Subcutaneous Tissue Debridement with a total area of 3.91 sq cm performed by STONE III, HOYT E., PA-C. With the following instrument(s): Curette to remove Viable and Non-Viable tissue/material. Material removed includes Subcutaneous Tissue, Slough, and Biofilm after achieving pain control using Other (lidocaine 4%). No  specimens were taken. A time out was conducted at 16:35, prior to the start of the procedure. A Minimum amount of bleeding was controlled with Pressure. The procedure was tolerated well with a pain level of 0 throughout and a pain level of 0 following the procedure. Patient s Level of Consciousness post procedure was recorded as Awake and Alert. Post Debridement Measurements: 2.3cm length x 1.7cm width x 0.9cm depth; 2.764cm^3 volume. Post debridement Stage noted as Category/Stage III. Character of Wound/Ulcer Post Debridement is stable. Post procedure Diagnosis Wound #1: Same as Pre-Procedure Plan Wound Cleansing: Wound #1 Sacrum: Clean wound with Normal Saline. Anesthetic (add to Medication List): Wound #1 Sacrum: Topical Lidocaine 4% cream applied to wound bed prior to debridement (In Clinic Only). Skin Barriers/Peri-Wound Care: Wound #1 Sacrum: Skin Prep Dressing Change Frequency: Wound #1 Sacrum: Change Dressing Monday, Wednesday, Friday - HHRN to go out to pts home MONDAYS after his appointment with [facility] to put the wound vac back on and change wound vac on [facility] and [place]. Follow-up Appointments: Wound #1 Sacrum: Return Appointment in 1 week. Off-Loading: Wound #1 Sacrum: Mattress - hospital bed Turn and reposition every 2 hours Other: - please make sure the pt is floating his heels while lying in bed Additional Orders / Instructions:  Wound #1 Sacrum: Increase protein intake. Home Health: Wound #1 Sacrum: [place] [facility]its - [facility] to go out to pts home MONDAYS after his appointment with [facility] to put the wound vac back on and change wound vac on [facility] and [place]. Home Health Nurse may visit PRN to address patient s wound care needs. [name], [name] ([id number]) FACE TO FACE ENCOUNTER: MEDICARE and MEDICAID PATIENTS: I certify that this patient is under my care and that I had a face-to-face encounter that meets the  physician face-to-face encounter requirements with this patient on this date. The encounter with the patient was in whole or in part for the following MEDICAL CONDITION: (primary reason for [place]) MEDICAL NECESSITY: I certify, that based on my findings, NURSING services are a medically necessary home health service. HOME BOUND STATUS: I certify that my clinical findings support that this patient is homebound (i.e., Due to illness or injury, pt requires aid of supportive devices such as crutches, cane, wheelchairs, walkers, the use of special transportation or the assistance of another person to leave their place of residence. There is a normal inability to leave the home and doing so requires considerable and taxing effort. Other absences are for medical reasons / religious services and are infrequent or of short duration when for other reasons). If current dressing causes regression in wound condition, may D/C ordered dressing product/s and apply Normal Saline Moist Dressing daily until next [place] / Other MD appointment. [place] of regression in wound condition at [phone]. Please direct any NON-WOUND related issues/requests for orders to patient's Primary Care Physician Negative Pressure Wound Therapy: Wound #1 Sacrum: Wound VAC settings at 125/130 mmHg continuous pressure. Use BLACK/GREEN foam to wound cavity. Use WHITE foam to fill any tunnel/s and/or undermining. Change VAC dressing 3 X WEEK. Change canister as indicated when full. Nurse may titrate settings and frequency of dressing changes as clinically indicated. - provider request for [facility] to apply duoderm dressing at base (6 oclock area) under the wound vac dressing in order to assist with wound vac dressing to adhere properly . If you have questions plaease call the wound care clinic. Home Health Nurse may d/c VAC for s/s of increased infection, significant wound regression, or  uncontrolled drainage. [place] at [phone]. Number of foam/gauze pieces used in the dressing = At this time I'm gonna suggest that we go ahead and initiate [name] above adding to do with them dressing to try to help in secure the field to the Wound VAC without it continue until he can get in trouble. Patient and his family are in agreement the plan. We will see him back for follow-up. Please see above for specific wound care orders. We will see patient for re-evaluation in 1 week(s) here in the clinic. If anything worsens or changes patient will contact our office for additional recommendations. Electronic Signature(s) Signed: 04/23/2018 8:46:34 PM By: Worthy Keeler PA-C Entered By: Worthy Keeler on 04/23/2018 19:52:52 [name], [name] [name] ([id number]) -------------------------------------------------------------------------------- ROS/PFSH Details Patient Name: [name] Date of Service: 04/23/2018 3:30 PM Medical Record Number: [id number] Patient Account Number: [id number] Date of Birth/Sex: 1931/01/14 (84 y.o. M) Treating RN: Roger Shelter Primary Care Provider: Burman Freestone Other Clinician: Referring Provider: Burman Freestone Treating Provider/Extender: [name], HOYT Weeks in Treatment: 10 Information Obtained From Patient Wound History Do you currently have one or more open woundso Yes How many open wounds do you currently haveo  2 Approximately how long have you had your woundso 3 weeks Has your wound(s) ever healed and then re-openedo Yes Have you had any lab work done in the past montho Yes Have you tested positive for osteomyelitis (bone infection)o No Have you had any tests for circulation on your legso No Constitutional Symptoms (General Health) Complaints and Symptoms: Negative for: Fever; Chills Eyes Medical History: Positive for: Cataracts - bilateral removal Negative for: Glaucoma; Optic  Neuritis Ear/Nose/Mouth/Throat Medical History: Negative for: Chronic sinus problems/congestion; Middle ear problems Hematologic/Lymphatic Medical History: Negative for: Anemia; Hemophilia; Human Immunodeficiency Virus; Lymphedema; Sickle Cell Disease Respiratory Complaints and Symptoms: No Complaints or Symptoms Medical History: Positive for: Asthma Negative for: Aspiration; Chronic Obstructive Pulmonary Disease (COPD); Pneumothorax; Sleep Apnea; Tuberculosis Cardiovascular Complaints and Symptoms: No Complaints or Symptoms Medical History: Positive for: Angina; Arrhythmia; Coronary Artery Disease; Hypertension; Myocardial Infarction - 2001 Negative for: Congestive Heart Failure; Deep Vein Thrombosis; Hypotension; Peripheral Arterial Disease; Peripheral [name], [name] ([id number]) Venous Disease; Phlebitis; Vasculitis Gastrointestinal Medical History: Negative for: Cirrhosis ; Colitis; Crohnos; Hepatitis A; Hepatitis B; Hepatitis C Endocrine Medical History: Negative for: Type I Diabetes; Type II Diabetes Past Medical History Notes: Borderline Genitourinary Medical History: Negative for: End Stage Renal Disease Immunological Medical History: Negative for: Lupus Erythematosus; Raynaudos; Scleroderma Integumentary (Skin) Medical History: Negative for: History of Burn; History of pressure wounds Musculoskeletal Medical History: Positive for: Osteoarthritis Negative for: Gout; Rheumatoid Arthritis; Osteomyelitis Neurologic Medical History: Positive for: Dementia; Neuropathy Negative for: Quadriplegia; Paraplegia; Seizure Disorder Oncologic Medical History: Negative for: Received Chemotherapy; Received Radiation Past Medical History Notes: Melanoma on back Psychiatric Complaints and Symptoms: No Complaints or Symptoms Medical History: Negative for: Anorexia/bulimia; Confinement Anxiety HBO Extended History Items Eyes: Cataracts [name], [name]  ([id number]) Immunizations Pneumococcal Vaccine: Received Pneumococcal Vaccination: Yes Implantable Devices Hospitalization / Surgery History Name of Hospital Purpose of Hospitalization/Surgery Date [facility] 01/20/2018 Family and Social History Cancer: Yes - Paternal Grandparents; Diabetes: Yes - Father; Heart Disease: Yes - Mother,Father; Hypertension: No; Kidney Disease: No; Lung Disease: No; Seizures: No; Stroke: Yes - Father; Thyroid Problems: No; Tuberculosis: No; Never smoker; Marital Status - Widowed; Alcohol Use: Never; Drug Use: No History; Caffeine Use: Daily; Financial Concerns: No; Food, Clothing or Shelter Needs: No; Support System Lacking: No; Transportation Concerns: No; Advanced Directives: Yes (Not Provided); Patient does not want information on Advanced Directives; Do not resuscitate: Yes (Not Provided); Living Will: Yes (Not Provided); Medical Power of Attorney: Yes - Jaquavis Felmlee (Not Provided) Physician Affirmation I have reviewed and agree with the above information. Electronic Signature(s) Signed: 04/23/2018 8:46:34 PM By: Worthy Keeler PA-C Signed: 04/24/2018 11:46:23 AM By: Roger Shelter Entered By: Worthy Keeler on 04/23/2018 19:52:03 JERMARION, POFFENBERGER ([id number]) -------------------------------------------------------------------------------- SuperBill Details Patient Name: [name] Date of Service: 04/23/2018 Medical Record Number: [id number] Patient Account Number: [id number] Date of Birth/Sex: 1931/07/29 (84 y.o. M) Treating RN: Roger Shelter Primary Care Provider: Burman Freestone Other Clinician: Referring Provider: Burman Freestone Treating Provider/Extender: [name], HOYT Weeks in Treatment: 10 Diagnosis Coding ICD-10 Codes Code Description L89.154 Pressure ulcer of sacral region, stage 4 L24.0 Irritant contact dermatitis due to detergents Facility Procedures CPT4 Code: 29798921 Description: 11042 - DEB SUBQ TISSUE 20 SQ CM/< ICD-10  Diagnosis Description L89.154 Pressure ulcer of sacral region, stage 4 Modifier: Quantity: 1 Physician Procedures CPT4 Code: 1941740 Description: 81448 - WC PHYS SUBQ TISS 20 SQ CM ICD-10 Diagnosis Description L89.154 Pressure ulcer of sacral region, stage 4 Modifier: Quantity: 1 Electronic Signature(s) Signed:  04/23/2018 8:46:34 PM By: Worthy Keeler PA-C Entered By: Worthy Keeler on 04/23/2018 19:53:09

## 2018-04-24 NOTE — Progress Notes (Signed)
FLORA, RATZ ([id number]) Visit Report for 04/23/2018 Arrival Information Details Patient Name: [name], [name]. Date of Service: 04/23/2018 3:30 PM Medical Record Number: [id number] Patient Account Number: [id number] Date of Birth/Sex: 29-Mar-1931 (84 y.o. M) Treating RN: Ahmed Prima Primary Care Sumayyah Custodio: Burman Freestone Other Clinician: Referring Aylen Stradford: Burman Freestone Treating Gaston Dase/Extender: [name], HOYT Weeks in Treatment: 10 Visit Information History Since Last Visit All ordered tests and consults were completed: No Patient Arrived: Cane Added or deleted any medications: No Arrival Time: 15:57 Any new allergies or adverse reactions: No Accompanied By: daughter Had a fall or experienced change in No Transfer Assistance: EasyPivot Patient activities of daily living that may affect Lift risk of falls: Patient Identification Verified: Yes Signs or symptoms of abuse/neglect since last visito No Secondary Verification Process Yes Hospitalized since last visit: No Completed: Implantable device outside of the clinic excluding No Patient Requires Transmission-Based No cellular tissue based products placed in the center Precautions: since last visit: Patient Has Alerts: Yes Has Dressing in Place as Prescribed: Yes Pain Present Now: No Electronic Signature(s) Signed: 04/23/2018 5:33:19 PM By: Alric Quan Entered By: Alric Quan on 04/23/2018 15:58:05 [name] ([id number]) -------------------------------------------------------------------------------- Encounter Discharge Information Details Patient Name: [name] Date of Service: 04/23/2018 3:30 PM Medical Record Number: [id number] Patient Account Number: [id number] Date of Birth/Sex: Jan 16, 1931 (84 y.o. M) Treating RN: Montey Hora Primary Care Hadessah Grennan: Burman Freestone Other Clinician: Referring Tanner Yeley: Burman Freestone Treating Seanpatrick Maisano/Extender: [name], HOYT Weeks in  Treatment: 10 Encounter Discharge Information Items Discharge Condition: Stable Ambulatory Status: Cane Discharge Destination: Home Transportation: Private Auto Accompanied By: dtr Schedule Follow-up Appointment: Yes Clinical Summary of Care: Electronic Signature(s) Signed: 04/23/2018 5:31:56 PM By: Montey Hora Entered By: Montey Hora on 04/23/2018 17:31:56 [name] ([id number]) -------------------------------------------------------------------------------- Lower Extremity Assessment Details Patient Name: [name] Date of Service: 04/23/2018 3:30 PM Medical Record Number: [id number] Patient Account Number: [id number] Date of Birth/Sex: 25-Jul-1931 (84 y.o. M) Treating RN: Ahmed Prima Primary Care Labrittany Wechter: Burman Freestone Other Clinician: Referring Ezel Vallone: Burman Freestone Treating Smera Guyette/Extender: [name], HOYT Weeks in Treatment: 10 Electronic Signature(s) Signed: 04/23/2018 5:33:19 PM By: Alric Quan Entered By: Alric Quan on 04/23/2018 16:00:32 [name] ([id number]) -------------------------------------------------------------------------------- Multi Wound Chart Details Patient Name: [name] Date of Service: 04/23/2018 3:30 PM Medical Record Number: [id number] Patient Account Number: [id number] Date of Birth/Sex: [birth year] (84 y.o. M) Treating RN: Roger Shelter Primary Care Bunyan Brier: Burman Freestone Other Clinician: Referring Zalea Pete: Burman Freestone Treating Shyla Gayheart/Extender: [name], HOYT Weeks in Treatment: 10 Vital Signs Height(in): 69 Pulse(bpm): 39 Weight(lbs): 155 Blood Pressure(mmHg): 137/76 Body Mass Index(BMI): 23 Temperature(F): 98.3 Respiratory Rate 18 (breaths/min): Photos: [1:No Photos] [N/A:N/A] Wound Location: [1:Sacrum] [N/A:N/A] Wounding Event: [1:Pressure Injury] [N/A:N/A] Primary Etiology: [1:Pressure Ulcer] [N/A:N/A] Comorbid History: [1:Cataracts, Asthma, Angina, Arrhythmia,  Coronary Artery Disease, Hypertension, Myocardial Infarction, Osteoarthritis, Dementia, Neuropathy] [N/A:N/A] Date Acquired: [1:01/17/2018] [N/A:N/A] Weeks of Treatment: [1:10] [N/A:N/A] Wound Status: [1:Open] [N/A:N/A] Measurements L x W x D [1:2.3x1.7x0.9] [N/A:N/A] (cm) Area (cm) : [1:3.071] [N/A:N/A] Volume (cm) : [1:2.764] [N/A:N/A] % Reduction in Area: [1:85.00%] [N/A:N/A] % Reduction in Volume: [1:93.20%] [N/A:N/A] Starting Position 1 [1:1] (o'clock): Ending Position 1 [1:2] (o'clock): Maximum Distance 1 (cm): [1:0.6] Undermining: [1:Yes] [N/A:N/A] Classification: [1:Category/Stage III] [N/A:N/A] Exudate Amount: [1:Large] [N/A:N/A] Exudate Type: [1:Purulent] [N/A:N/A] Exudate Color: [1:yellow, brown, green] [N/A:N/A] Wound Margin: [1:Distinct, outline attached] [N/A:N/A] Granulation Amount: [1:None Present (0%)] [N/A:N/A] Necrotic Amount: [1:Large (67-100%)] [N/A:N/A] Exposed Structures: [1:Fat Layer (Subcutaneous Tissue) Exposed: Yes Fascia: No Tendon: No  Muscle: No] [N/A:N/A] Joint: No Bone: No Epithelialization: None N/A N/A Periwound Skin Texture: Excoriation: Yes N/A N/A Induration: No Callus: No Crepitus: No Rash: No Scarring: No Periwound Skin Moisture: Maceration: No N/A N/A Dry/Scaly: No Periwound Skin Color: Erythema: Yes N/A N/A Atrophie Blanche: No Cyanosis: No Ecchymosis: No Hemosiderin Staining: No Mottled: No Pallor: No Rubor: No Erythema Location: Circumferential N/A N/A Temperature: No Abnormality N/A N/A Tenderness on Palpation: Yes N/A N/A Wound Preparation: Ulcer Cleansing: N/A N/A Rinsed/Irrigated with Saline Topical Anesthetic Applied: Other: lidocaine 4% Treatment Notes Electronic Signature(s) Signed: 04/24/2018 11:46:23 AM By: Roger Shelter Entered By: Roger Shelter on 04/23/2018 16:32:32 [name] ([id number]) -------------------------------------------------------------------------------- [place] Details Patient Name: [name], [name]. Date of Service: 04/23/2018 3:30 PM Medical Record Number: [id number] Patient Account Number: [id number] Date of Birth/Sex: 04-06-1931 (84 y.o. M) Treating RN: Roger Shelter Primary Care Taeveon Keesling: Burman Freestone Other Clinician: Referring Allisyn Kunz: Burman Freestone Treating Helayna Dun/Extender: [name], HOYT Weeks in Treatment: 10 Active Inactive ` Abuse / Safety / Falls / Self Care Management Nursing Diagnoses: History of Falls Goals: Patient will not experience any injury related to falls Date Initiated: 02/06/2018 Target Resolution Date: 03/08/2018 Goal Status: Active Interventions: Assess fall risk on admission and as needed Treatment Activities: Patient referred to home care : 02/06/2018 Notes: ` Necrotic Tissue Nursing Diagnoses: Impaired tissue integrity related to necrotic/devitalized tissue Goals: Necrotic/devitalized tissue will be minimized in the wound bed Date Initiated: 02/06/2018 Target Resolution Date: 03/08/2018 Goal Status: Active Interventions: Assess patient pain level pre-, during and post procedure and prior to discharge Treatment Activities: Excisional debridement : 02/06/2018 Notes: ` Nutrition Nursing Diagnoses: Potential for alteratiion in Nutrition/Potential for imbalanced nutrition [name], [name] ([id number]) Goals: Patient/caregiver agrees to and verbalizes understanding of need to obtain nutritional consultation Date Initiated: 02/06/2018 Target Resolution Date: 03/08/2018 Goal Status: Active Interventions: Provide education on nutrition Notes: ` Orientation to the Wound Care Program Nursing Diagnoses: Knowledge deficit related to the wound healing center program Goals: Patient/caregiver will verbalize understanding of the [facility] [facility] Date Initiated: 02/06/2018 Target Resolution Date: 03/08/2018 Goal Status: Active Interventions: Provide education on orientation to  the wound center Notes: ` Pressure Nursing Diagnoses: Knowledge deficit related to management of pressures ulcers Goals: Patient/caregiver will verbalize understanding of pressure ulcer management Date Initiated: 02/06/2018 Target Resolution Date: 03/08/2018 Goal Status: Active Interventions: Assess offloading mechanisms upon admission and as needed Provide education on pressure ulcers Notes: ` Wound/Skin Impairment Nursing Diagnoses: Knowledge deficit related to ulceration/compromised skin integrity Goals: Ulcer/skin breakdown will have a volume reduction of 80% by week 12 Date Initiated: 02/06/2018 Target Resolution Date: 04/08/2018 Goal Status: Active [name], [name] ([id number]) Interventions: Assess ulceration(s) every visit Treatment Activities: Skin care regimen initiated : 02/06/2018 Topical wound management initiated : 02/06/2018 Notes: Electronic Signature(s) Signed: 04/24/2018 11:46:23 AM By: Roger Shelter Entered By: Roger Shelter on 04/23/2018 16:32:22 [name] ([id number]) -------------------------------------------------------------------------------- Pain Assessment Details Patient Name: [name] Date of Service: 04/23/2018 3:30 PM Medical Record Number: [id number] Patient Account Number: [id number] Date of Birth/Sex: 1931/09/20 (84 y.o. M) Treating RN: Ahmed Prima Primary Care Dajae Kizer: Burman Freestone Other Clinician: Referring Lekeya Rollings: Burman Freestone Treating Zaden Sako/Extender: [name], HOYT Weeks in Treatment: 10 Active Problems Location of Pain Severity and Description of Pain Patient Has Paino No Site Locations Pain Management and Medication Current Pain Management: Electronic Signature(s) Signed: 04/23/2018 5:33:19 PM By: Alric Quan Entered By: Alric Quan on 04/23/2018 15:58:11 [name] ([id number]) -------------------------------------------------------------------------------- Patient/Caregiver  Education Details  Patient Name: [name], [name]. Date of Service: 04/23/2018 3:30 PM Medical Record Number: [id number] Patient Account Number: [id number] Date of Birth/Gender: 07-23-1931 (84 y.o. M) Treating RN: Montey Hora Primary Care Physician: Burman Freestone Other Clinician: Referring Physician: Burman Freestone Treating Physician/Extender: Sharalyn Ink in Treatment: 10 Education Assessment Education Provided To: Patient Education Topics Provided Wound/Skin Impairment: Handouts: Other: wound care if needed for vac complications Methods: Explain/Verbal Responses: State content correctly Electronic Signature(s) Signed: 04/23/2018 5:34:11 PM By: Montey Hora Entered By: Montey Hora on 04/23/2018 17:32:41 [name] ([id number]) -------------------------------------------------------------------------------- Wound Assessment Details Patient Name: [name] Date of Service: 04/23/2018 3:30 PM Medical Record Number: [id number] Patient Account Number: [id number] Date of Birth/Sex: 12/12/1930 (84 y.o. M) Treating RN: Ahmed Prima Primary Care Damaria Stofko: Burman Freestone Other Clinician: Referring Prabhnoor Ellenberger: Burman Freestone Treating Akemi Overholser/Extender: STONE III, HOYT Weeks in Treatment: 10 Wound Status Wound Number: 1 Primary Pressure Ulcer Etiology: Wound Location: Sacrum Wound Open Wounding Event: Pressure Injury Status: Date Acquired: 01/17/2018 Comorbid Cataracts, Asthma, Angina, Arrhythmia, Weeks Of Treatment: 10 History: Coronary Artery Disease, Hypertension, Clustered Wound: No Myocardial Infarction, Osteoarthritis, Dementia, Neuropathy Photos Photo Uploaded By: Alric Quan on 04/23/2018 17:02:46 Wound Measurements Length: (cm) 2.3 Width: (cm) 1.7 Depth: (cm) 0.9 Area: (cm) 3.071 Volume: (cm) 2.764 % Reduction in Area: 85% % Reduction in Volume: 93.2% Epithelialization: None Tunneling: No Undermining: Yes Starting  Position (o'clock): 1 Ending Position (o'clock): 2 Maximum Distance: (cm) 0.6 Wound Description Classification: Category/Stage III Wound Margin: Distinct, outline attached Exudate Amount: Large Exudate Type: Purulent Exudate Color: yellow, brown, green Foul Odor After Cleansing: No Slough/Fibrino Yes Wound Bed Granulation Amount: None Present (0%) Exposed Structure Necrotic Amount: Large (67-100%) Fascia Exposed: No Necrotic Quality: Adherent Slough Fat Layer (Subcutaneous Tissue) Exposed: Yes Tendon Exposed: No Muscle Exposed: No Joint Exposed: No [name], [name] ([id number]) Bone Exposed: No Periwound Skin Texture Texture Color No Abnormalities Noted: No No Abnormalities Noted: No Callus: No Atrophie Blanche: No Crepitus: No Cyanosis: No Excoriation: Yes Ecchymosis: No Induration: No Erythema: Yes Rash: No Erythema Location: Circumferential Scarring: No Hemosiderin Staining: No Mottled: No Moisture Pallor: No No Abnormalities Noted: No Rubor: No Dry / Scaly: No Maceration: No Temperature / Pain Temperature: No Abnormality Tenderness on Palpation: Yes Wound Preparation Ulcer Cleansing: Rinsed/Irrigated with Saline Topical Anesthetic Applied: Other: lidocaine 4%, Treatment Notes Wound #1 (Sacrum) 1. Cleansed with: Clean wound with Normal Saline 2. Anesthetic Topical Lidocaine 4% cream to wound bed prior to debridement 8. Negative Pressure Wound Therapy Wound Vac to wound continuously at 148mm/hg pressure Black Foam Notes 1 piece of black foam to bridge and put in wound bed Electronic Signature(s) Signed: 04/23/2018 5:33:19 PM By: Alric Quan Entered By: Alric Quan on 04/23/2018 16:07:43 [name] ([id number]) -------------------------------------------------------------------------------- Vitals Details Patient Name: [name] Date of Service: 04/23/2018 3:30 PM Medical Record Number: [id number] Patient Account Number:  [id number] Date of Birth/Sex: 1931-03-24 (84 y.o. M) Treating RN: Ahmed Prima Primary Care Marvelene Stoneberg: Burman Freestone Other Clinician: Referring Jazmin Vensel: Burman Freestone Treating Tanicka Bisaillon/Extender: [name], HOYT Weeks in Treatment: 10 Vital Signs Time Taken: 15:58 Temperature (F): 98.3 Height (in): 69 Pulse (bpm): 61 Weight (lbs): 155 Respiratory Rate (breaths/min): 18 Body Mass Index (BMI): 22.9 Blood Pressure (mmHg): 137/76 Reference Range: 80 - 120 mg / dl Electronic Signature(s) Signed: 04/23/2018 5:33:19 PM By: Alric Quan Entered By: Alric Quan on 04/23/2018 16:00:20

## 2018-04-30 ENCOUNTER — Encounter: Payer: Medicare Other | Admitting: Physician Assistant

## 2018-04-30 DIAGNOSIS — L89154 Pressure ulcer of sacral region, stage 4: Secondary | ICD-10-CM | POA: Diagnosis not present

## 2018-05-01 NOTE — Progress Notes (Signed)
DYRON, KAWANO ([id number]) Visit Report for 04/30/2018 Chief Complaint Document Details Patient Name: TAYVIEN, KANE. Date of Service: 04/30/2018 9:00 AM Medical Record Number: [id number] Patient Account Number: [id number] Date of Birth/Sex: [birth year] (84 y.o. M) Treating RN: Roger Shelter Primary Care Provider: Burman Freestone Other Clinician: Referring Provider: Burman Freestone Treating Provider/Extender: [name], HOYT Weeks in Treatment: 11 Information Obtained from: Patient Chief Complaint Sacral pressure ulcer and left shoulder rash Electronic Signature(s) Signed: 05/01/2018 1:18:11 PM By: Worthy Keeler PA-C Entered By: Worthy Keeler on 04/30/2018 09:22:34 [name] ([id number]) -------------------------------------------------------------------------------- Debridement Details Patient Name: [name] Date of Service: 04/30/2018 9:00 AM Medical Record Number: [id number] Patient Account Number: [id number] Date of Birth/Sex: July 19, 1931 (84 y.o. M) Treating RN: Ahmed Prima Primary Care Provider: Burman Freestone Other Clinician: Referring Provider: Burman Freestone Treating Provider/Extender: [name], HOYT Weeks in Treatment: 11 Debridement Performed for Wound #1 Sacrum Assessment: Performed By: Physician STONE III, HOYT E., PA-C Debridement Type: Debridement Pre-procedure Verification/Time Yes - 09:57 Out Taken: Start Time: 09:57 Pain Control: Other : lidocaine 4% Total Area Debrided (L x W): 2.3 (cm) x 1.3 (cm) = 2.99 (cm) Tissue and other material Viable, Non-Viable, Slough, Subcutaneous, Biofilm, Slough debrided: Level: Skin/Subcutaneous Tissue Debridement Description: Excisional Instrument: Curette Bleeding: Minimum Hemostasis Achieved: Pressure End Time: 09:58 Procedural Pain: 0 Post Procedural Pain: 0 Response to Treatment: Procedure was tolerated well Level of Consciousness: Awake and Alert Post Debridement Measurements  of Total Wound Length: (cm) 2.3 Stage: Category/Stage III Width: (cm) 1.3 Depth: (cm) 0.6 Volume: (cm) 1.409 Character of Wound/Ulcer Post Stable Debridement: Post Procedure Diagnosis Same as Pre-procedure Electronic Signature(s) Signed: 04/30/2018 5:41:08 PM By: Alric Quan Signed: 05/01/2018 1:18:11 PM By: Worthy Keeler PA-C Entered By: Alric Quan on 04/30/2018 09:58:57 [name] ([id number]) -------------------------------------------------------------------------------- HPI Details Patient Name: [name] Date of Service: 04/30/2018 9:00 AM Medical Record Number: [id number] Patient Account Number: [id number] Date of Birth/Sex: June 04, 1931 (84 y.o. M) Treating RN: Roger Shelter Primary Care Provider: Burman Freestone Other Clinician: Referring Provider: Burman Freestone Treating Provider/Extender: [name], HOYT Weeks in Treatment: 11 History of Present Illness HPI Description: 02/06/18 on evaluation today patient presents for initial evaluation and our clinic concerning an issue which began roughly 3 weeks ago when the patient fell in his home on the floor in his kitchen and laid him down this detergent for roughly 3 days. He had a pressure injury to the left shoulder. This unfortunately has caused him a lot of discomfort although it finally seems to be doing better if anything is really having a lot of itching right now. This appears potentially be a contact dermatitis issue. He also has a significant pressure injury to the sacrum at this time as well which is also showing fascia exposure right over the bone but no evidence of bone exposure at this point which is good news. They have been using Santyl as well as Saline soaked gauze at this point in time. There does appear to be a lot of necrotic slough in the base of the wound. He does have a history of incontinence, myocardial infarction, and hypertension. He also is "borderline  diabetic" hemoglobin A1c of 6.0. Currently he has some discomfort in the pressure site at the sacrum but fortunately nothing too significant this did require sharp debridement today. 02/13/18 on evaluation today patient appears to be doing much better in regard to his sacral wound. He has been tolerating the dressing changes without complication with the Vashe.  Fortunately there is no evidence of infection and though there is some Slough on the surface of the wound bed he has excellent granulation noted. Overall I'm pleased with how things have progressed in that regard. A glance at his shoulder as well and the rash seems to be someone improving in my pinion at this site as well. Overall I am pleased with what we're seeing and so is the family. 02/20/18 on evaluation today patient appears to be doing a little worse in regard to the sacral wound only in the fact that there is redness surrounding it has me somewhat concerned for infection. The drainage has also apparently been a little bit off color compared to normal according to family they did keep the dressing today that was removed to show me and I agree this seems to be a little bit different compared to what we have been seeing. Coupled with the redness I'm concerned he may be developing some cellulitis surrounding the wound bed. 02/27/18 on evaluation today patient presents for follow-up concerning his sacral ulcer. We have received the results back from his wound culture which shows unfortunately that the doxycycline will not be of benefit for him I am going to need to initiate treatment with something else in order to treat the pseudomonas. Otherwise he does not seem to be having any significant pain although his daughter states there are sometimes when he states having pain. We continue to use the Vashe currently. 03/06/18 on evaluation today patient's sacral wound appears to be doing better in my opinion. He has been tolerating the dressing  changes without complication. With that being said the silver nitrate has helped with the prominent area of hyper granulation at the 6 o'clock location we will likely need to repeat this again today. Nonetheless overall I am pleased with how things have improved over the last week. The erythema surrounding the wound seems to be greatly improved. 03/13/18 on evaluation today patient's wound actually does not appear to be terribly infected although she does continue to have erythema surrounding the wound bed especially on the left border. I'm still somewhat concerned about the fact that the oral antibiotics alone may not be completely treating his infection. I previously discussed may need to go for IV antibiotic therapy I'm concerned that may be the case. We will need to make a referral today for infectious disease. 03/20/18 on evaluation today the patient sacral wound actually appears to be doing fairly well in regard to granulation although he continues unfortunately to have it your theme is surrounding the periwound region. There's also some increased swelling at the 6 o'clock location which also has me somewhat concerned. With that being said he does have some discomfort but nothing too significant at this point. He still has not heard from infectious disease his daughter and wife are both present during the office visit today they're going to check back with this again. We did get the information for them to call them today. 03/27/18 on evaluation today patient is seen concerning his ongoing sacral ulcer. He has been tolerating the dressing changes without complication. With that being said he does present with evidence of bright green drainage noted on the dressing which again is something that I do often expect to see with a pseudomonas infection. He continues to have your theme is KEYLOR, RANDS ([id number]) surrounding the wound bed as well and again I'm not 100% convinced this is just pressure  related. I did speak with Colletta Maryland who  is the nurse practitioner in [place] with infectious disease. I spoke with her actually yesterday concerning this patient. She is not 100% convinced that this is infected. She question whether the wound may just be colonized with Pseudomonas and not actually causing an active infection. I am really not thinking that the edema is associated with pressure alone and again overall I don't feel that your theme and is consistent with a pressure injury either as he's never had any contusions noted like a deep tissue injury on his heel which was new and I did visualize today this was on the left heel. Nonetheless she wanted to give this a little bit more time and thought it would be appropriate to start the Wound VAC at this point. 04/03/18 on evaluation today patient sacral ulcer actually appears to be doing fairly well at this point. He has been tolerating the dressing changes without complication. With that being said I'm very pleased with the progress that has been made in regard to his sacral wound over the past week I do not see as much in the way of erythema which is great news. Nonetheless he does have a small area of hyper granulation unfortunately. This is at roughly the 7 o'clock location and I think does need to be addressed so that this will heal more appropriately. Nonetheless I think we may be ready to go ahead and initiate therapy with the Wound VAC. 04/10/18 on evaluation today patient appears to be doing excellent in regard to his sacral ulcer. The show signs of great improvement in overall I'm very pleased with how things look. He has been tolerating the dressing changes without complication. Specifically this is the Wound VAC. He also seems to be doing well with the antibiotic there is decreased your theme and redness surrounding the sacral area at this point in the wound has filled in quite significantly. 04/17/18 on evaluation today patient  actually appears to be doing excellent in regard to his sacral ulcer. He's been tolerating the dressing changes without complication specifically the Wound VAC. There really are no major concerns from the patient nor family this point he is having no pain. He does have a little bit of Epiboly on the lateral portions of the wound where he does have a little bit more depth that will need to be addressed today. 04/23/18 on evaluation today patient's wound actually appears to be doing excellent at this point. He has been tolerating the Wound VAC and this appears to be doing well other than the fact that it seems to be breaking seal at the 6 o'clock location. I do believe that adding a duodenum dressing at this location try and help maintain the seal would be appropriate and likely very effective. With that being said he overall seems to be showing signs of good improvement at this point. There does not appear to be any evidence of significant infection which is also excellent news. 04/30/18 on evaluation today patient actually appears to be doing well in regard to his sacral ulcer. He's been tolerating the dressing changes without complication. Fortunately there does not appear to be any evidence of infection. Overall I'm very pleased with the progress that has been made up to this point. He does have some blistering underneath the draping unfortunately although this is definitely something that has been noted on other patients previously is a fairly common occurrence. Nonetheless the patient seems to be doing fairly well in general in my pinion based on what I  see at this time. I do believe these are fairly superficial and minor. Electronic Signature(s) Signed: 05/01/2018 1:18:11 PM By: Worthy Keeler PA-C Entered By: Worthy Keeler on 04/30/2018 10:09:17 [name] ([id number]) -------------------------------------------------------------------------------- Physical Exam Details Patient Name:  NINO, AMANO Date of Service: 04/30/2018 9:00 AM Medical Record Number: [id number] Patient Account Number: [id number] Date of Birth/Sex: 12-Aug-1931 (84 y.o. M) Treating RN: Roger Shelter Primary Care Provider: Burman Freestone Other Clinician: Referring Provider: Burman Freestone Treating Provider/Extender: STONE III, HOYT Weeks in Treatment: 64 Constitutional Well-nourished and well-hydrated in no acute distress. Respiratory normal breathing without difficulty. Psychiatric this patient is able to make decisions and demonstrates good insight into disease process. Alert and Oriented x 3. pleasant and cooperative. Notes Currently patient's wound bed did have some Slough noted on the surface of the wound which did require sharp debridement today this was cleaned away in an excellent fashion without him experiencing any discomfort whatsoever which is good news. Post debridement the wound bed appears to be doing much better. Electronic Signature(s) Signed: 05/01/2018 1:18:11 PM By: Worthy Keeler PA-C Entered By: Worthy Keeler on 04/30/2018 10:09:57 [name] ([id number]) -------------------------------------------------------------------------------- Physician Orders Details Patient Name: AARISH, ROCKERS Date of Service: 04/30/2018 9:00 AM Medical Record Number: [id number] Patient Account Number: [id number] Date of Birth/Sex: January 27, 1931 (84 y.o. M) Treating RN: Ahmed Prima Primary Care Provider: Burman Freestone Other Clinician: Referring Provider: Burman Freestone Treating Provider/Extender: [name], HOYT Weeks in Treatment: 11 Verbal / Phone Orders: No Diagnosis Coding ICD-10 Coding Code Description L89.154 Pressure ulcer of sacral region, stage 4 L24.0 Irritant contact dermatitis due to detergents Wound Cleansing Wound #1 Sacrum o Clean wound with Normal Saline. Anesthetic (add to Medication List) Wound #1 Sacrum o Topical Lidocaine 4% cream applied  to wound bed prior to debridement (In Clinic Only). Skin Barriers/Peri-Wound Care Wound #1 Sacrum o Skin Prep o Triamcinolone Acetonide Ointment (TCA) - cream in clinic on red areas around wound where tape has irritated skin Provider to order gel for home use by [facility] Primary Wound Dressing Wound #1 Sacrum o Other: - saline gauze packing with dry gauze in clinic Secondary Dressing Wound #1 Sacrum o Boardered Foam Dressing Dressing Change Frequency Wound #1 Sacrum o Change Dressing Monday, Wednesday, Friday - HHRN to go out to pts home MONDAYS after his appointment with [facility] to put the wound vac back on and change wound vac on [facility] and FRIDAYS. Follow-up Appointments Wound #1 Sacrum o Return Appointment in 1 week. Off-Loading Wound #1 Sacrum o Mattress - hospital bed o Turn and reposition every 2 hours o Other: - please make sure the pt is floating his heels while lying in bed ALDAIR, RICKEL. ([id number]) Additional Orders / Instructions Wound #1 Sacrum o Increase protein intake. Home Health Wound #1 [place] [facility] to go out to pts home MONDAYS after his appointment with [facility] to put the wound vac back on and change wound vac on [facility] and FRIDAYS. o Home Health Nurse may visit PRN to address patientos wound care needs. o FACE TO FACE ENCOUNTER: MEDICARE and MEDICAID PATIENTS: I certify that this patient is under my care and that I had a face-to-face encounter that meets the physician face-to-face encounter requirements with this patient on this date. The encounter with the patient was in whole or in part for the following MEDICAL CONDITION: (primary reason for [facility]) MEDICAL NECESSITY: I certify, that based  on my findings, NURSING services are a medically necessary home health service. HOME BOUND STATUS: I certify that my clinical findings support that this patient is  homebound (i.e., Due to illness or injury, pt requires aid of supportive devices such as crutches, cane, wheelchairs, walkers, the use of special transportation or the assistance of another person to leave their place of residence. There is a normal inability to leave the home and doing so requires considerable and taxing effort. Other absences are for medical reasons / religious services and are infrequent or of short duration when for other reasons). o If current dressing causes regression in wound condition, may D/C ordered dressing product/s and apply Normal Saline Moist Dressing daily until next [facility] / Other MD appointment. [place] of regression in wound condition at [phone]. o Please direct any NON-WOUND related issues/requests for orders to patient's Primary Care Physician Negative Pressure Wound Therapy Wound #1 Sacrum o Wound VAC settings at 125/130 mmHg continuous pressure. Use BLACK/GREEN foam to wound cavity. Use WHITE foam to fill any tunnel/s and/or undermining. Change VAC dressing 3 X WEEK. Change canister as indicated when full. Nurse may titrate settings and frequency of dressing changes as clinically indicated. - provider request for [facility] to apply duoderm dressing at base (6 oclock area) under the wound vac dressing in order to assist with wound vac dressing to adhere properly . If you have questions plaease call the wound care clinic. o Home Health Nurse may d/c VAC for s/s of increased infection, significant wound regression, or uncontrolled drainage. [place] at [phone]. o Number of foam/gauze pieces used in the dressing = Electronic Signature(s) Signed: 04/30/2018 5:41:08 PM By: Alric Quan Signed: 05/01/2018 1:18:11 PM By: Worthy Keeler PA-C Previous Signature: 04/30/2018 10:06:02 AM Version By: Worthy Keeler PA-C Entered By: Alric Quan on 04/30/2018 10:14:11 [name]  ([id number]) -------------------------------------------------------------------------------- Problem List Details Patient Name: DECKARD, STUBER. Date of Service: 04/30/2018 9:00 AM Medical Record Number: [id number] Patient Account Number: [id number] Date of Birth/Sex: Dec 26, 1930 (84 y.o. M) Treating RN: Roger Shelter Primary Care Provider: Burman Freestone Other Clinician: Referring Provider: Burman Freestone Treating Provider/Extender: [name], HOYT Weeks in Treatment: 11 Active Problems ICD-10 Evaluated Encounter Code Description Active Date Today Diagnosis L89.154 Pressure ulcer of sacral region, stage 4 02/06/2018 No Yes L24.0 Irritant contact dermatitis due to detergents 02/06/2018 No Yes Inactive Problems Resolved Problems Electronic Signature(s) Signed: 05/01/2018 1:18:11 PM By: Worthy Keeler PA-C Entered By: Worthy Keeler on 04/30/2018 09:22:25 [name] ([id number]) -------------------------------------------------------------------------------- Progress Note Details Patient Name: [name] Date of Service: 04/30/2018 9:00 AM Medical Record Number: [id number] Patient Account Number: [id number] Date of Birth/Sex: 25-Jun-1931 (84 y.o. M) Treating RN: Roger Shelter Primary Care Provider: Burman Freestone Other Clinician: Referring Provider: Burman Freestone Treating Provider/Extender: [name], HOYT Weeks in Treatment: 11 Subjective Chief Complaint Information obtained from Patient Sacral pressure ulcer and left shoulder rash History of Present Illness (HPI) 02/06/18 on evaluation today patient presents for initial evaluation and our clinic concerning an issue which began roughly 3 weeks ago when the patient fell in his home on the floor in his kitchen and laid him down this detergent for roughly 3 days. He had a pressure injury to the left shoulder. This unfortunately has caused him a lot of discomfort although it finally seems to be doing better  if anything is really having a lot of itching right now. This appears potentially be a contact  dermatitis issue. He also has a significant pressure injury to the sacrum at this time as well which is also showing fascia exposure right over the bone but no evidence of bone exposure at this point which is good news. They have been using Santyl as well as Saline soaked gauze at this point in time. There does appear to be a lot of necrotic slough in the base of the wound. He does have a history of incontinence, myocardial infarction, and hypertension. He also is "borderline diabetic" hemoglobin A1c of 6.0. Currently he has some discomfort in the pressure site at the sacrum but fortunately nothing too significant this did require sharp debridement today. 02/13/18 on evaluation today patient appears to be doing much better in regard to his sacral wound. He has been tolerating the dressing changes without complication with the Vashe. Fortunately there is no evidence of infection and though there is some Slough on the surface of the wound bed he has excellent granulation noted. Overall I'm pleased with how things have progressed in that regard. A glance at his shoulder as well and the rash seems to be someone improving in my pinion at this site as well. Overall I am pleased with what we're seeing and so is the family. 02/20/18 on evaluation today patient appears to be doing a little worse in regard to the sacral wound only in the fact that there is redness surrounding it has me somewhat concerned for infection. The drainage has also apparently been a little bit off color compared to normal according to family they did keep the dressing today that was removed to show me and I agree this seems to be a little bit different compared to what we have been seeing. Coupled with the redness I'm concerned he may be developing some cellulitis surrounding the wound bed. 02/27/18 on evaluation today patient presents for  follow-up concerning his sacral ulcer. We have received the results back from his wound culture which shows unfortunately that the doxycycline will not be of benefit for him I am going to need to initiate treatment with something else in order to treat the pseudomonas. Otherwise he does not seem to be having any significant pain although his daughter states there are sometimes when he states having pain. We continue to use the Vashe currently. 03/06/18 on evaluation today patient's sacral wound appears to be doing better in my opinion. He has been tolerating the dressing changes without complication. With that being said the silver nitrate has helped with the prominent area of hyper granulation at the 6 o'clock location we will likely need to repeat this again today. Nonetheless overall I am pleased with how things have improved over the last week. The erythema surrounding the wound seems to be greatly improved. 03/13/18 on evaluation today patient's wound actually does not appear to be terribly infected although she does continue to have erythema surrounding the wound bed especially on the left border. I'm still somewhat concerned about the fact that the oral antibiotics alone may not be completely treating his infection. I previously discussed may need to go for IV antibiotic therapy I'm concerned that may be the case. We will need to make a referral today for infectious disease. 03/20/18 on evaluation today the patient sacral wound actually appears to be doing fairly well in regard to granulation although he continues unfortunately to have it your theme is surrounding the periwound region. There's also some increased swelling at the 6 o'clock location which also has me somewhat  concerned. With that being said he does have some discomfort but MUSCAB, BRENNEMAN. ([id number]) nothing too significant at this point. He still has not heard from infectious disease his daughter and wife are both present during  the office visit today they're going to check back with this again. We did get the information for them to call them today. 03/27/18 on evaluation today patient is seen concerning his ongoing sacral ulcer. He has been tolerating the dressing changes without complication. With that being said he does present with evidence of bright green drainage noted on the dressing which again is something that I do often expect to see with a pseudomonas infection. He continues to have your theme is surrounding the wound bed as well and again I'm not 100% convinced this is just pressure related. I did speak with Colletta Maryland who is the nurse practitioner in [place] with infectious disease. I spoke with her actually yesterday concerning this patient. She is not 100% convinced that this is infected. She question whether the wound may just be colonized with Pseudomonas and not actually causing an active infection. I am really not thinking that the edema is associated with pressure alone and again overall I don't feel that your theme and is consistent with a pressure injury either as he's never had any contusions noted like a deep tissue injury on his heel which was new and I did visualize today this was on the left heel. Nonetheless she wanted to give this a little bit more time and thought it would be appropriate to start the Wound VAC at this point. 04/03/18 on evaluation today patient sacral ulcer actually appears to be doing fairly well at this point. He has been tolerating the dressing changes without complication. With that being said I'm very pleased with the progress that has been made in regard to his sacral wound over the past week I do not see as much in the way of erythema which is great news. Nonetheless he does have a small area of hyper granulation unfortunately. This is at roughly the 7 o'clock location and I think does need to be addressed so that this will heal more appropriately. Nonetheless I think  we may be ready to go ahead and initiate therapy with the Wound VAC. 04/10/18 on evaluation today patient appears to be doing excellent in regard to his sacral ulcer. The show signs of great improvement in overall I'm very pleased with how things look. He has been tolerating the dressing changes without complication. Specifically this is the Wound VAC. He also seems to be doing well with the antibiotic there is decreased your theme and redness surrounding the sacral area at this point in the wound has filled in quite significantly. 04/17/18 on evaluation today patient actually appears to be doing excellent in regard to his sacral ulcer. He's been tolerating the dressing changes without complication specifically the Wound VAC. There really are no major concerns from the patient nor family this point he is having no pain. He does have a little bit of Epiboly on the lateral portions of the wound where he does have a little bit more depth that will need to be addressed today. 04/23/18 on evaluation today patient's wound actually appears to be doing excellent at this point. He has been tolerating the Wound VAC and this appears to be doing well other than the fact that it seems to be breaking seal at the 6 o'clock location. I do believe that adding a duodenum dressing at  this location try and help maintain the seal would be appropriate and likely very effective. With that being said he overall seems to be showing signs of good improvement at this point. There does not appear to be any evidence of significant infection which is also excellent news. 04/30/18 on evaluation today patient actually appears to be doing well in regard to his sacral ulcer. He's been tolerating the dressing changes without complication. Fortunately there does not appear to be any evidence of infection. Overall I'm very pleased with the progress that has been made up to this point. He does have some blistering underneath the  draping unfortunately although this is definitely something that has been noted on other patients previously is a fairly common occurrence. Nonetheless the patient seems to be doing fairly well in general in my pinion based on what I see at this time. I do believe these are fairly superficial and minor. Patient History Information obtained from Patient. Family History Cancer - Paternal Grandparents, Diabetes - Father, Heart Disease - Mother,Father, Stroke - Father, No family history of Hypertension, Kidney Disease, Lung Disease, Seizures, Thyroid Problems, Tuberculosis. Social History Never smoker, Marital Status - Widowed, Alcohol Use - Never, Drug Use - No History, Caffeine Use - Daily. Medical History Hospitalization/Surgery History - 01/20/2018, [facility]. DAITON, COWLES ([id number]) Medical And Surgical History Notes Endocrine Borderline Oncologic Melanoma on back Review of Systems (ROS) Constitutional Symptoms (General Health) Denies complaints or symptoms of Fever, Chills. Respiratory The patient has no complaints or symptoms. Cardiovascular The patient has no complaints or symptoms. Psychiatric The patient has no complaints or symptoms. Objective Constitutional Well-nourished and well-hydrated in no acute distress. Vitals Time Taken: 9:14 AM, Height: 69 in, Weight: 155 lbs, BMI: 22.9, Temperature: 98.4 F, Pulse: 62 bpm, Respiratory Rate: 16 breaths/min, Blood Pressure: 190/72 mmHg. General Notes: took BP manually 180/80 made Margarita Grizzle, PA-C aware of same Respiratory normal breathing without difficulty. Psychiatric this patient is able to make decisions and demonstrates good insight into disease process. Alert and Oriented x 3. pleasant and cooperative. General Notes: Currently patient's wound bed did have some Slough noted on the surface of the wound which did require sharp debridement today this was cleaned away in an excellent fashion without him experiencing any  discomfort whatsoever which is good news. Post debridement the wound bed appears to be doing much better. Integumentary (Hair, Skin) Wound #1 status is Open. Original cause of wound was Pressure Injury. The wound is located on the Sacrum. The wound measures 2.3cm length x 1.3cm width x 0.5cm depth; 2.348cm^2 area and 1.174cm^3 volume. There is Fat Layer (Subcutaneous Tissue) Exposed exposed. There is no tunneling noted, however, there is undermining starting at 1:00 and ending at 3:00 with a maximum distance of 0.4cm. There is a large amount of purulent drainage noted. The wound margin is distinct with the outline attached to the wound base. There is medium (34-66%) granulation within the wound bed. There is a medium (34-66%) amount of necrotic tissue within the wound bed including Adherent Slough. The periwound skin appearance exhibited: Excoriation, Erythema. The periwound skin appearance did not exhibit: Callus, Crepitus, Induration, Rash, Scarring, Dry/Scaly, Maceration, Atrophie Blanche, Cyanosis, Ecchymosis, Hemosiderin Staining, Mottled, Pallor, Rubor. The surrounding wound skin color is noted with erythema which is circumferential. Periwound temperature was noted as No Abnormality. The periwound has tenderness on palpation. SANDON, YOHO ([id number]) Assessment Active Problems ICD-10 Pressure ulcer of sacral region, stage 4 Irritant contact dermatitis due to detergents Procedures Wound #1 Pre-procedure  diagnosis of Wound #1 is a Pressure Ulcer located on the Sacrum . There was a Excisional Skin/Subcutaneous Tissue Debridement with a total area of 2.99 sq cm performed by STONE III, HOYT E., PA-C. With the following instrument(s): Curette to remove Viable and Non-Viable tissue/material. Material removed includes Subcutaneous Tissue, Slough, and Biofilm after achieving pain control using Other (lidocaine 4%). No specimens were taken. A time out was conducted at 09:57, prior to the  start of the procedure. A Minimum amount of bleeding was controlled with Pressure. The procedure was tolerated well with a pain level of 0 throughout and a pain level of 0 following the procedure. Patient s Level of Consciousness post procedure was recorded as Awake and Alert. Post Debridement Measurements: 2.3cm length x 1.3cm width x 0.6cm depth; 1.409cm^3 volume. Post debridement Stage noted as Category/Stage III. Character of Wound/Ulcer Post Debridement is stable. Post procedure Diagnosis Wound #1: Same as Pre-Procedure Plan Wound Cleansing: Wound #1 Sacrum: Clean wound with Normal Saline. Anesthetic (add to Medication List): Wound #1 Sacrum: Topical Lidocaine 4% cream applied to wound bed prior to debridement (In Clinic Only). Skin Barriers/Peri-Wound Care: Wound #1 Sacrum: Skin Prep Triamcinolone Acetonide Ointment (TCA) - cream in clinic on red areas around wound where tape has irritated skin Provider to order gel for home use by [facility] Primary Wound Dressing: Wound #1 Sacrum: Other: - saline gauze packing with dry gauze in clinic Secondary Dressing: Wound #1 Sacrum: Boardered Foam Dressing Dressing Change Frequency: Wound #1 Sacrum: Change Dressing Monday, Wednesday, Friday - HHRN to go out to pts home MONDAYS after his appointment with [facility] to put the wound vac back on and change wound vac on [facility] and [place]. Follow-up Appointments: ASTON, LIESKE ([id number]) Wound #1 Sacrum: Return Appointment in 1 week. Off-Loading: Wound #1 Sacrum: Mattress - hospital bed Turn and reposition every 2 hours Other: - please make sure the pt is floating his heels while lying in bed Additional Orders / Instructions: Wound #1 Sacrum: Increase protein intake. Home Health: Wound #1 Sacrum: [place] [facility] - [facility] to go out to pts home MONDAYS after his appointment with [facility] to put the wound vac back on and change wound vac on  [facility] and [place]. Home Health Nurse may visit PRN to address patient s wound care needs. FACE TO FACE ENCOUNTER: MEDICARE and MEDICAID PATIENTS: I certify that this patient is under my care and that I had a face-to-face encounter that meets the physician face-to-face encounter requirements with this patient on this date. The encounter with the patient was in whole or in part for the following MEDICAL CONDITION: (primary reason for [place]) MEDICAL NECESSITY: I certify, that based on my findings, NURSING services are a medically necessary home health service. HOME BOUND STATUS: I certify that my clinical findings support that this patient is homebound (i.e., Due to illness or injury, pt requires aid of supportive devices such as crutches, cane, wheelchairs, walkers, the use of special transportation or the assistance of another person to leave their place of residence. There is a normal inability to leave the home and doing so requires considerable and taxing effort. Other absences are for medical reasons / religious services and are infrequent or of short duration when for other reasons). If current dressing causes regression in wound condition, may D/C ordered dressing product/s and apply Normal Saline Moist Dressing daily until next [place] / Other MD appointment. [place] of regression in  wound condition at [phone]. Please direct any NON-WOUND related issues/requests for orders to patient's Primary Care Physician Negative Pressure Wound Therapy: Wound #1 Sacrum: Wound VAC settings at 125/130 mmHg continuous pressure. Use BLACK/GREEN foam to wound cavity. Use WHITE foam to fill any tunnel/s and/or undermining. Change VAC dressing 3 X WEEK. Change canister as indicated when full. Nurse may titrate settings and frequency of dressing changes as clinically indicated. - provider request for [facility] to apply duoderm dressing at base (6 oclock area)  under the wound vac dressing in order to assist with wound vac dressing to adhere properly . If you have questions plaease call the wound care clinic. Home Health Nurse may d/c VAC for s/s of increased infection, significant wound regression, or uncontrolled drainage. [place] at [phone]. Number of foam/gauze pieces used in the dressing = The following medication(s) was prescribed: triamcinolone acetonide topical 0.1 % cream cream topical apply a very thin film to affected region and allow to dry before application of wound vac starting 04/30/2018 At this time I'm gonna recommend that we go ahead and continue with the Current wound care measures for the next week. The patient is in agreement the plan. We will subsequently see were things stand at that point I did send in a prescription for triamcinolone cream to be used over the affected areas in the sacral region where he does have some rash noted. With that being said this will need to be applied in a very thin film and allowed to drive before application of the Wound VAC nonetheless I think it will help with the irritation. Patient's daughter is in agreement with the plan as is the patient. We will subsequently see were things stand at follow-up in one weeks time. If you doing well at that point I may switching to every other week checks at that time. Please see above for specific wound care orders. We will see patient for re-evaluation in 1 week(s) here in the clinic. If anything worsens or changes patient will contact our office for additional recommendations. ALEGANDRO, MACNAUGHTON ([id number]) Electronic Signature(s) Signed: 05/01/2018 1:18:11 PM By: Worthy Keeler PA-C Entered By: Worthy Keeler on 04/30/2018 10:11:07 JAHSIAH, CARPENTER ([id number]) -------------------------------------------------------------------------------- ROS/PFSH Details Patient Name: [name] Date of Service: 04/30/2018 9:00  AM Medical Record Number: [id number] Patient Account Number: [id number] Date of Birth/Sex: 01-10-1931 (84 y.o. M) Treating RN: Roger Shelter Primary Care Provider: Burman Freestone Other Clinician: Referring Provider: Burman Freestone Treating Provider/Extender: [name], HOYT Weeks in Treatment: 11 Information Obtained From Patient Wound History Do you currently have one or more open woundso Yes How many open wounds do you currently haveo 2 Approximately how long have you had your woundso 3 weeks Has your wound(s) ever healed and then re-openedo Yes Have you had any lab work done in the past montho Yes Have you tested positive for osteomyelitis (bone infection)o No Have you had any tests for circulation on your legso No Constitutional Symptoms (General Health) Complaints and Symptoms: Negative for: Fever; Chills Eyes Medical History: Positive for: Cataracts - bilateral removal Negative for: Glaucoma; Optic Neuritis Ear/Nose/Mouth/Throat Medical History: Negative for: Chronic sinus problems/congestion; Middle ear problems Hematologic/Lymphatic Medical History: Negative for: Anemia; Hemophilia; Human Immunodeficiency Virus; Lymphedema; Sickle Cell Disease Respiratory Complaints and Symptoms: No Complaints or Symptoms Medical History: Positive for: Asthma Negative for: Aspiration; Chronic Obstructive Pulmonary Disease (COPD); Pneumothorax; Sleep Apnea; Tuberculosis Cardiovascular Complaints and Symptoms: No Complaints or Symptoms Medical History: Positive for: Angina; Arrhythmia;  Coronary Artery Disease; Hypertension; Myocardial Infarction - 2001 Negative for: Congestive Heart Failure; Deep Vein Thrombosis; Hypotension; Peripheral Arterial Disease; Peripheral JEYDAN, BARNER ([id number]) Venous Disease; Phlebitis; Vasculitis Gastrointestinal Medical History: Negative for: Cirrhosis ; Colitis; Crohnos; Hepatitis A; Hepatitis B; Hepatitis C Endocrine Medical  History: Negative for: Type I Diabetes; Type II Diabetes Past Medical History Notes: Borderline Genitourinary Medical History: Negative for: End Stage Renal Disease Immunological Medical History: Negative for: Lupus Erythematosus; Raynaudos; Scleroderma Integumentary (Skin) Medical History: Negative for: History of Burn; History of pressure wounds Musculoskeletal Medical History: Positive for: Osteoarthritis Negative for: Gout; Rheumatoid Arthritis; Osteomyelitis Neurologic Medical History: Positive for: Dementia; Neuropathy Negative for: Quadriplegia; Paraplegia; Seizure Disorder Oncologic Medical History: Negative for: Received Chemotherapy; Received Radiation Past Medical History Notes: Melanoma on back Psychiatric Complaints and Symptoms: No Complaints or Symptoms Medical History: Negative for: Anorexia/bulimia; Confinement Anxiety HBO Extended History Items Eyes: Cataracts REO, PORTELA ([id number]) Immunizations Pneumococcal Vaccine: Received Pneumococcal Vaccination: Yes Implantable Devices Hospitalization / Surgery History Name of Hospital Purpose of Hospitalization/Surgery Date [facility] 01/20/2018 Family and Social History Cancer: Yes - Paternal Grandparents; Diabetes: Yes - Father; Heart Disease: Yes - Mother,Father; Hypertension: No; Kidney Disease: No; Lung Disease: No; Seizures: No; Stroke: Yes - Father; Thyroid Problems: No; Tuberculosis: No; Never smoker; Marital Status - Widowed; Alcohol Use: Never; Drug Use: No History; Caffeine Use: Daily; Financial Concerns: No; Food, Clothing or Shelter Needs: No; Support System Lacking: No; Transportation Concerns: No; Advanced Directives: Yes (Not Provided); Patient does not want information on Advanced Directives; Do not resuscitate: Yes (Not Provided); Living Will: Yes (Not Provided); Medical Power of Attorney: Yes - Delyle Weider (Not Provided) Physician Affirmation I have reviewed and agree with the above  information. Electronic Signature(s) Signed: 04/30/2018 5:44:59 PM By: Roger Shelter Signed: 05/01/2018 1:18:11 PM By: Worthy Keeler PA-C Entered By: Worthy Keeler on 04/30/2018 10:09:35 [name] ([id number]) -------------------------------------------------------------------------------- SuperBill Details Patient Name: [name] Date of Service: 04/30/2018 Medical Record Number: [id number] Patient Account Number: [id number] Date of Birth/Sex: 19-Jun-1931 (84 y.o. M) Treating RN: Roger Shelter Primary Care Provider: Burman Freestone Other Clinician: Referring Provider: Burman Freestone Treating Provider/Extender: [name], HOYT Weeks in Treatment: 11 Diagnosis Coding ICD-10 Codes Code Description L89.154 Pressure ulcer of sacral region, stage 4 L24.0 Irritant contact dermatitis due to detergents Facility Procedures CPT4 Code: [id number] Description: 11042 - DEB SUBQ TISSUE 20 SQ CM/< ICD-10 Diagnosis Description L89.154 Pressure ulcer of sacral region, stage 4 Modifier: Quantity: 1 Physician Procedures CPT4 Code: 6195093 Description: 26712 - WC PHYS SUBQ TISS 20 SQ CM ICD-10 Diagnosis Description L89.154 Pressure ulcer of sacral region, stage 4 Modifier: Quantity: 1 Electronic Signature(s) Signed: 05/01/2018 1:18:11 PM By: Worthy Keeler PA-C Entered By: Worthy Keeler on 04/30/2018 10:11:36

## 2018-05-01 NOTE — Progress Notes (Signed)
[name], [name] ([id number]) Visit Report for 04/30/2018 Arrival Information Details Patient Name: [name], [name]. Date of Service: 04/30/2018 9:00 AM Medical Record Number: [id number] Patient Account Number: [id number] Date of Birth/Sex: 06-30-1931 (84 y.o. M) Treating RN: Ahmed Prima Primary Care Janeka Libman: Burman Freestone Other Clinician: Referring Brad Lieurance: Burman Freestone Treating Shyvonne Chastang/Extender: [name], HOYT Weeks in Treatment: 11 Visit Information History Since Last Visit All ordered tests and consults were completed: No Patient Arrived: Ambulatory Added or deleted any medications: No Arrival Time: 09:13 Any new allergies or adverse reactions: No Accompanied By: daughter Had a fall or experienced change in No Transfer Assistance: None activities of daily living that may affect Patient Identification Verified: Yes risk of falls: Secondary Verification Process Completed: Yes Signs or symptoms of abuse/neglect since last visito No Patient Requires Transmission-Based No Hospitalized since last visit: No Precautions: Implantable device outside of the clinic excluding No Patient Has Alerts: Yes cellular tissue based products placed in the center since last visit: Has Dressing in Place as Prescribed: Yes Pain Present Now: No Electronic Signature(s) Signed: 04/30/2018 5:41:08 PM By: Alric Quan Entered By: Alric Quan on 04/30/2018 09:13:22 [name] ([id number]) -------------------------------------------------------------------------------- Encounter Discharge Information Details Patient Name: [name] Date of Service: 04/30/2018 9:00 AM Medical Record Number: [id number] Patient Account Number: [id number] Date of Birth/Sex: 1931-04-25 (84 y.o. M) Treating RN: Ahmed Prima Primary Care Vitali Seibert: Burman Freestone Other Clinician: Referring Anabell Swint: Burman Freestone Treating Blain Hunsucker/Extender: [name], HOYT Weeks in Treatment:  11 Encounter Discharge Information Items Discharge Condition: Stable Ambulatory Status: Ambulatory Discharge Destination: Home Transportation: Private Auto Schedule Follow-up Appointment: Yes Clinical Summary of Care: Electronic Signature(s) Signed: 04/30/2018 5:41:08 PM By: Alric Quan Entered By: Alric Quan on 04/30/2018 10:16:17 [name] ([id number]) -------------------------------------------------------------------------------- Lower Extremity Assessment Details Patient Name: [name] Date of Service: 04/30/2018 9:00 AM Medical Record Number: [id number] Patient Account Number: [id number] Date of Birth/Sex: 30-Dec-1930 (84 y.o. M) Treating RN: Ahmed Prima Primary Care Hollan Philipp: Burman Freestone Other Clinician: Referring Manjinder Breau: Burman Freestone Treating Garvey Westcott/Extender: [name], HOYT Weeks in Treatment: 11 Electronic Signature(s) Signed: 04/30/2018 5:41:08 PM By: Alric Quan Entered By: Alric Quan on 04/30/2018 09:27:00 [name] ([id number]) -------------------------------------------------------------------------------- Multi Wound Chart Details Patient Name: [name] Date of Service: 04/30/2018 9:00 AM Medical Record Number: [id number] Patient Account Number: [id number] Date of Birth/Sex: December 10, 1930 (84 y.o. M) Treating RN: Ahmed Prima Primary Care Renesmee Raine: Burman Freestone Other Clinician: Referring Soren Pigman: Burman Freestone Treating Macyn Remmert/Extender: [name], HOYT Weeks in Treatment: 11 Vital Signs Height(in): 69 Pulse(bpm): 21 Weight(lbs): 155 Blood Pressure(mmHg): 190/72 Body Mass Index(BMI): 23 Temperature(F): 98.4 Respiratory Rate 16 (breaths/min): Photos: [1:No Photos] [N/A:N/A] Wound Location: [1:Sacrum] [N/A:N/A] Wounding Event: [1:Pressure Injury] [N/A:N/A] Primary Etiology: [1:Pressure Ulcer] [N/A:N/A] Comorbid History: [1:Cataracts, Asthma, Angina, Arrhythmia, Coronary Artery  Disease, Hypertension, Myocardial Infarction, Osteoarthritis, Dementia, Neuropathy] [N/A:N/A] Date Acquired: [1:01/17/2018] [N/A:N/A] Weeks of Treatment: [1:11] [N/A:N/A] Wound Status: [1:Open] [N/A:N/A] Measurements L x W x D [1:2.3x1.3x0.5] [N/A:N/A] (cm) Area (cm) : [1:2.348] [N/A:N/A] Volume (cm) : [1:1.174] [N/A:N/A] % Reduction in Area: [1:88.50%] [N/A:N/A] % Reduction in Volume: [1:97.10%] [N/A:N/A] Starting Position 1 [1:1] (o'clock): Ending Position 1 [1:3] (o'clock): Maximum Distance 1 (cm): [1:0.4] Undermining: [1:Yes] [N/A:N/A] Classification: [1:Category/Stage III] [N/A:N/A] Exudate Amount: [1:Large] [N/A:N/A] Exudate Type: [1:Purulent] [N/A:N/A] Exudate Color: [1:yellow, brown, green] [N/A:N/A] Wound Margin: [1:Distinct, outline attached] [N/A:N/A] Granulation Amount: [1:Medium (34-66%)] [N/A:N/A] Necrotic Amount: [1:Medium (34-66%)] [N/A:N/A] Exposed Structures: [1:Fat Layer (Subcutaneous Tissue) Exposed: Yes Fascia: No Tendon: No Muscle: No] [N/A:N/A] Joint: No Bone:  No Epithelialization: None N/A N/A Periwound Skin Texture: Excoriation: Yes N/A N/A Induration: No Callus: No Crepitus: No Rash: No Scarring: No Periwound Skin Moisture: Maceration: No N/A N/A Dry/Scaly: No Periwound Skin Color: Erythema: Yes N/A N/A Atrophie Blanche: No Cyanosis: No Ecchymosis: No Hemosiderin Staining: No Mottled: No Pallor: No Rubor: No Erythema Location: Circumferential N/A N/A Temperature: No Abnormality N/A N/A Tenderness on Palpation: Yes N/A N/A Wound Preparation: Ulcer Cleansing: N/A N/A Rinsed/Irrigated with Saline Topical Anesthetic Applied: Other: lidocaine 4% Treatment Notes Electronic Signature(s) Signed: 04/30/2018 5:41:08 PM By: Alric Quan Entered By: Alric Quan on 04/30/2018 09:53:15 [name] ([id number]) -------------------------------------------------------------------------------- [place]  Details Patient Name: [name], [name]. Date of Service: 04/30/2018 9:00 AM Medical Record Number: [id number] Patient Account Number: [id number] Date of Birth/Sex: 04-09-1931 (84 y.o. M) Treating RN: Ahmed Prima Primary Care Daliya Parchment: Burman Freestone Other Clinician: Referring Rosezella Kronick: Burman Freestone Treating Ludella Pranger/Extender: [name], HOYT Weeks in Treatment: 11 Active Inactive ` Abuse / Safety / Falls / Self Care Management Nursing Diagnoses: History of Falls Goals: Patient will not experience any injury related to falls Date Initiated: 02/06/2018 Target Resolution Date: 03/08/2018 Goal Status: Active Interventions: Assess fall risk on admission and as needed Treatment Activities: Patient referred to home care : 02/06/2018 Notes: ` Necrotic Tissue Nursing Diagnoses: Impaired tissue integrity related to necrotic/devitalized tissue Goals: Necrotic/devitalized tissue will be minimized in the wound bed Date Initiated: 02/06/2018 Target Resolution Date: 03/08/2018 Goal Status: Active Interventions: Assess patient pain level pre-, during and post procedure and prior to discharge Treatment Activities: Excisional debridement : 02/06/2018 Notes: ` Nutrition Nursing Diagnoses: Potential for alteratiion in Nutrition/Potential for imbalanced nutrition [name], [name] ([id number]) Goals: Patient/caregiver agrees to and verbalizes understanding of need to obtain nutritional consultation Date Initiated: 02/06/2018 Target Resolution Date: 03/08/2018 Goal Status: Active Interventions: Provide education on nutrition Notes: ` Orientation to the Wound Care Program Nursing Diagnoses: Knowledge deficit related to the wound healing center program Goals: Patient/caregiver will verbalize understanding of the [facility] Date Initiated: 02/06/2018 Target Resolution Date: 03/08/2018 Goal Status: Active Interventions: Provide education on orientation to the wound  center Notes: ` Pressure Nursing Diagnoses: Knowledge deficit related to management of pressures ulcers Goals: Patient/caregiver will verbalize understanding of pressure ulcer management Date Initiated: 02/06/2018 Target Resolution Date: 03/08/2018 Goal Status: Active Interventions: Assess offloading mechanisms upon admission and as needed Provide education on pressure ulcers Notes: ` Wound/Skin Impairment Nursing Diagnoses: Knowledge deficit related to ulceration/compromised skin integrity Goals: Ulcer/skin breakdown will have a volume reduction of 80% by week 12 Date Initiated: 02/06/2018 Target Resolution Date: 04/08/2018 Goal Status: Active [name], [name] ([id number]) Interventions: Assess ulceration(s) every visit Treatment Activities: Skin care regimen initiated : 02/06/2018 Topical wound management initiated : 02/06/2018 Notes: Electronic Signature(s) Signed: 04/30/2018 5:41:08 PM By: Alric Quan Entered By: Alric Quan on 04/30/2018 09:53:03 [name] ([id number]) -------------------------------------------------------------------------------- Pain Assessment Details Patient Name: [name] Date of Service: 04/30/2018 9:00 AM Medical Record Number: [id number] Patient Account Number: [id number] Date of Birth/Sex: 1931/03/14 (84 y.o. M) Treating RN: Ahmed Prima Primary Care Mikiya Nebergall: Burman Freestone Other Clinician: Referring Corryn Madewell: Burman Freestone Treating Briston Lax/Extender: [name], HOYT Weeks in Treatment: 11 Active Problems Location of Pain Severity and Description of Pain Patient Has Paino No Site Locations Pain Management and Medication Current Pain Management: Electronic Signature(s) Signed: 04/30/2018 5:41:08 PM By: Alric Quan Entered By: Alric Quan on 04/30/2018 09:13:29 [name] ([id number]) -------------------------------------------------------------------------------- Patient/Caregiver Education  Details Patient Name: [name] Date  of Service: 04/30/2018 9:00 AM Medical Record Number: [id number] Patient Account Number: [id number] Date of Birth/Gender: February 05, 1931 (84 y.o. M) Treating RN: Ahmed Prima Primary Care Physician: Burman Freestone Other Clinician: Referring Physician: Burman Freestone Treating Physician/Extender: Sharalyn Ink in Treatment: 11 Education Assessment Education Provided To: Patient and Caregiver Education Topics Provided Wound Debridement: Handouts: Wound Debridement Methods: Explain/Verbal Responses: State content correctly Wound/Skin Impairment: Handouts: Caring for Your Ulcer Methods: Explain/Verbal Responses: State content correctly Electronic Signature(s) Signed: 04/30/2018 5:41:08 PM By: Alric Quan Entered By: Alric Quan on 04/30/2018 10:16:37 [name] ([id number]) -------------------------------------------------------------------------------- Wound Assessment Details Patient Name: [name] Date of Service: 04/30/2018 9:00 AM Medical Record Number: [id number] Patient Account Number: [id number] Date of Birth/Sex: [birth year] (84 y.o. M) Treating RN: Ahmed Prima Primary Care Mason Dibiasio: Burman Freestone Other Clinician: Referring Iyani Dresner: Burman Freestone Treating Tinia Oravec/Extender: STONE III, HOYT Weeks in Treatment: 11 Wound Status Wound Number: 1 Primary Pressure Ulcer Etiology: Wound Location: Sacrum Wound Open Wounding Event: Pressure Injury Status: Date Acquired: 01/17/2018 Comorbid Cataracts, Asthma, Angina, Arrhythmia, Weeks Of Treatment: 11 History: Coronary Artery Disease, Hypertension, Clustered Wound: No Myocardial Infarction, Osteoarthritis, Dementia, Neuropathy Photos Photo Uploaded By: Alric Quan on 04/30/2018 11:22:59 Wound Measurements Length: (cm) 2.3 Width: (cm) 1.3 Depth: (cm) 0.5 Area: (cm) 2.348 Volume: (cm) 1.174 % Reduction in Area: 88.5% %  Reduction in Volume: 97.1% Epithelialization: None Tunneling: No Undermining: Yes Starting Position (o'clock): 1 Ending Position (o'clock): 3 Maximum Distance: (cm) 0.4 Wound Description Classification: Category/Stage III Wound Margin: Distinct, outline attached Exudate Amount: Large Exudate Type: Purulent Exudate Color: yellow, brown, green Foul Odor After Cleansing: No Slough/Fibrino Yes Wound Bed Granulation Amount: Medium (34-66%) Exposed Structure Necrotic Amount: Medium (34-66%) Fascia Exposed: No Necrotic Quality: Adherent Slough Fat Layer (Subcutaneous Tissue) Exposed: Yes Tendon Exposed: No [name], [name] ([id number]) Muscle Exposed: No Joint Exposed: No Bone Exposed: No Periwound Skin Texture Texture Color No Abnormalities Noted: No No Abnormalities Noted: No Callus: No Atrophie Blanche: No Crepitus: No Cyanosis: No Excoriation: Yes Ecchymosis: No Induration: No Erythema: Yes Rash: No Erythema Location: Circumferential Scarring: No Hemosiderin Staining: No Mottled: No Moisture Pallor: No No Abnormalities Noted: No Rubor: No Dry / Scaly: No Maceration: No Temperature / Pain Temperature: No Abnormality Tenderness on Palpation: Yes Wound Preparation Ulcer Cleansing: Rinsed/Irrigated with Saline Topical Anesthetic Applied: Other: lidocaine 4%, Treatment Notes Wound #1 (Sacrum) 1. Cleansed with: Clean wound with Normal Saline 2. Anesthetic Topical Lidocaine 4% cream to wound bed prior to debridement Notes gauze saline soaked with dry gauze on top then BFD HHRN to apply wound vac later today Electronic Signature(s) Signed: 04/30/2018 5:41:08 PM By: Alric Quan Entered By: Alric Quan on 04/30/2018 09:26:49 [name] ([id number]) -------------------------------------------------------------------------------- [place] Details Patient Name: [name] Date of Service: 04/30/2018 9:00 AM Medical Record Number:  [id number] Patient Account Number: [id number] Date of Birth/Sex: 1931/01/01 (84 y.o. M) Treating RN: Ahmed Prima Primary Care Carman Essick: Burman Freestone Other Clinician: Referring Owen Pratte: Burman Freestone Treating Dazja Houchin/Extender: [name], HOYT Weeks in Treatment: 11 Vital Signs Time Taken: 09:14 Temperature (F): 98.4 Height (in): 69 Pulse (bpm): 62 Weight (lbs): 155 Respiratory Rate (breaths/min): 16 Body Mass Index (BMI): 22.9 Blood Pressure (mmHg): 190/72 Reference Range: 80 - 120 mg / dl Notes took BP manually 180/80 made Margarita Grizzle, PA-C aware of same Electronic Signature(s) Signed: 04/30/2018 5:41:08 PM By: Alric Quan Entered By: Alric Quan on 04/30/2018 09:18:54

## 2018-05-07 ENCOUNTER — Encounter: Payer: Medicare Other | Admitting: Physician Assistant

## 2018-05-07 DIAGNOSIS — L89154 Pressure ulcer of sacral region, stage 4: Secondary | ICD-10-CM | POA: Diagnosis not present

## 2018-05-08 NOTE — Progress Notes (Signed)
HUNTER, BACHAR ([id number]) Visit Report for 05/07/2018 Chief Complaint Document Details Patient Name: [name], [name]. Date of Service: 05/07/2018 9:15 AM Medical Record Number: [id number] Patient Account Number: [id number] Date of Birth/Sex: 11-Feb-1931 (84 y.o. M) Treating RN: Roger Shelter Primary Care Provider: Burman Freestone Other Clinician: Referring Provider: Burman Freestone Treating Provider/Extender: [name], HOYT Weeks in Treatment: 12 Information Obtained from: Patient Chief Complaint Sacral pressure ulcer and left shoulder rash Electronic Signature(s) Signed: 05/07/2018 11:53:17 PM By: Worthy Keeler PA-C Entered By: Worthy Keeler on 05/07/2018 10:11:08 [name] ([id number]) -------------------------------------------------------------------------------- Debridement Details Patient Name: [name] Date of Service: 05/07/2018 9:15 AM Medical Record Number: [id number] Patient Account Number: [id number] Date of Birth/Sex: 20-Dec-1930 (84 y.o. M) Treating RN: Roger Shelter Primary Care Provider: Burman Freestone Other Clinician: Referring Provider: Burman Freestone Treating Provider/Extender: [name], HOYT Weeks in Treatment: 12 Debridement Performed for Wound #1 Sacrum Assessment: Performed By: Physician STONE III, HOYT E., PA-C Debridement Type: Debridement Pre-procedure Verification/Time Yes - 10:14 Out Taken: Start Time: 10:14 Pain Control: Other : lidocaine 4% Total Area Debrided (L x W): 1.5 (cm) x 1.5 (cm) = 2.25 (cm) Tissue and other material Viable, Non-Viable, Slough, Subcutaneous, Biofilm, Slough debrided: Level: Skin/Subcutaneous Tissue Debridement Description: Excisional Instrument: Curette Bleeding: Moderate Hemostasis Achieved: Pressure End Time: 10:16 Procedural Pain: 0 Post Procedural Pain: 0 Response to Treatment: Procedure was tolerated well Level of Consciousness: Awake and Alert Post Debridement  Measurements of Total Wound Length: (cm) 1.5 Stage: Category/Stage III Width: (cm) 1.5 Depth: (cm) 0.3 Volume: (cm) 0.53 Character of Wound/Ulcer Post Stable Debridement: Post Procedure Diagnosis Same as Pre-procedure Electronic Signature(s) Signed: 05/07/2018 5:25:05 PM By: Roger Shelter Signed: 05/07/2018 11:53:17 PM By: Worthy Keeler PA-C Entered By: Roger Shelter on 05/07/2018 10:16:38 [name] ([id number]) -------------------------------------------------------------------------------- HPI Details Patient Name: [name] Date of Service: 05/07/2018 9:15 AM Medical Record Number: [id number] Patient Account Number: [id number] Date of Birth/Sex: [birth year] (84 y.o. M) Treating RN: Roger Shelter Primary Care Provider: Burman Freestone Other Clinician: Referring Provider: Burman Freestone Treating Provider/Extender: [name], HOYT Weeks in Treatment: 12 History of Present Illness HPI Description: 02/06/18 on evaluation today patient presents for initial evaluation and our clinic concerning an issue which began roughly 3 weeks ago when the patient fell in his home on the floor in his kitchen and laid him down this detergent for roughly 3 days. He had a pressure injury to the left shoulder. This unfortunately has caused him a lot of discomfort although it finally seems to be doing better if anything is really having a lot of itching right now. This appears potentially be a contact dermatitis issue. He also has a significant pressure injury to the sacrum at this time as well which is also showing fascia exposure right over the bone but no evidence of bone exposure at this point which is good news. They have been using Santyl as well as Saline soaked gauze at this point in time. There does appear to be a lot of necrotic slough in the base of the wound. He does have a history of incontinence, myocardial infarction, and hypertension. He also is "borderline  diabetic" hemoglobin A1c of 6.0. Currently he has some discomfort in the pressure site at the sacrum but fortunately nothing too significant this did require sharp debridement today. 02/13/18 on evaluation today patient appears to be doing much better in regard to his sacral wound. He has been tolerating the dressing changes without complication with the Vashe.  Fortunately there is no evidence of infection and though there is some Slough on the surface of the wound bed he has excellent granulation noted. Overall I'm pleased with how things have progressed in that regard. A glance at his shoulder as well and the rash seems to be someone improving in my pinion at this site as well. Overall I am pleased with what we're seeing and so is the family. 02/20/18 on evaluation today patient appears to be doing a little worse in regard to the sacral wound only in the fact that there is redness surrounding it has me somewhat concerned for infection. The drainage has also apparently been a little bit off color compared to normal according to family they did keep the dressing today that was removed to show me and I agree this seems to be a little bit different compared to what we have been seeing. Coupled with the redness I'm concerned he may be developing some cellulitis surrounding the wound bed. 02/27/18 on evaluation today patient presents for follow-up concerning his sacral ulcer. We have received the results back from his wound culture which shows unfortunately that the doxycycline will not be of benefit for him I am going to need to initiate treatment with something else in order to treat the pseudomonas. Otherwise he does not seem to be having any significant pain although his daughter states there are sometimes when he states having pain. We continue to use the Vashe currently. 03/06/18 on evaluation today patient's sacral wound appears to be doing better in my opinion. He has been tolerating the dressing  changes without complication. With that being said the silver nitrate has helped with the prominent area of hyper granulation at the 6 o'clock location we will likely need to repeat this again today. Nonetheless overall I am pleased with how things have improved over the last week. The erythema surrounding the wound seems to be greatly improved. 03/13/18 on evaluation today patient's wound actually does not appear to be terribly infected although she does continue to have erythema surrounding the wound bed especially on the left border. I'm still somewhat concerned about the fact that the oral antibiotics alone may not be completely treating his infection. I previously discussed may need to go for IV antibiotic therapy I'm concerned that may be the case. We will need to make a referral today for infectious disease. 03/20/18 on evaluation today the patient sacral wound actually appears to be doing fairly well in regard to granulation although he continues unfortunately to have it your theme is surrounding the periwound region. There's also some increased swelling at the 6 o'clock location which also has me somewhat concerned. With that being said he does have some discomfort but nothing too significant at this point. He still has not heard from infectious disease his daughter and wife are both present during the office visit today they're going to check back with this again. We did get the information for them to call them today. 03/27/18 on evaluation today patient is seen concerning his ongoing sacral ulcer. He has been tolerating the dressing changes without complication. With that being said he does present with evidence of bright green drainage noted on the dressing which again is something that I do often expect to see with a pseudomonas infection. He continues to have your theme is CHANANYA, CANIZALEZ ([id number]) surrounding the wound bed as well and again I'm not 100% convinced this is just pressure  related. I did speak with Colletta Maryland who  is the nurse practitioner in [place] with infectious disease. I spoke with her actually yesterday concerning this patient. She is not 100% convinced that this is infected. She question whether the wound may just be colonized with Pseudomonas and not actually causing an active infection. I am really not thinking that the edema is associated with pressure alone and again overall I don't feel that your theme and is consistent with a pressure injury either as he's never had any contusions noted like a deep tissue injury on his heel which was new and I did visualize today this was on the left heel. Nonetheless she wanted to give this a little bit more time and thought it would be appropriate to start the Wound VAC at this point. 04/03/18 on evaluation today patient sacral ulcer actually appears to be doing fairly well at this point. He has been tolerating the dressing changes without complication. With that being said I'm very pleased with the progress that has been made in regard to his sacral wound over the past week I do not see as much in the way of erythema which is great news. Nonetheless he does have a small area of hyper granulation unfortunately. This is at roughly the 7 o'clock location and I think does need to be addressed so that this will heal more appropriately. Nonetheless I think we may be ready to go ahead and initiate therapy with the Wound VAC. 04/10/18 on evaluation today patient appears to be doing excellent in regard to his sacral ulcer. The show signs of great improvement in overall I'm very pleased with how things look. He has been tolerating the dressing changes without complication. Specifically this is the Wound VAC. He also seems to be doing well with the antibiotic there is decreased your theme and redness surrounding the sacral area at this point in the wound has filled in quite significantly. 04/17/18 on evaluation today patient  actually appears to be doing excellent in regard to his sacral ulcer. He's been tolerating the dressing changes without complication specifically the Wound VAC. There really are no major concerns from the patient nor family this point he is having no pain. He does have a little bit of Epiboly on the lateral portions of the wound where he does have a little bit more depth that will need to be addressed today. 04/23/18 on evaluation today patient's wound actually appears to be doing excellent at this point. He has been tolerating the Wound VAC and this appears to be doing well other than the fact that it seems to be breaking seal at the 6 o'clock location. I do believe that adding a duodenum dressing at this location try and help maintain the seal would be appropriate and likely very effective. With that being said he overall seems to be showing signs of good improvement at this point. There does not appear to be any evidence of significant infection which is also excellent news. 04/30/18 on evaluation today patient actually appears to be doing well in regard to his sacral ulcer. He's been tolerating the dressing changes without complication. Fortunately there does not appear to be any evidence of infection. Overall I'm very pleased with the progress that has been made up to this point. He does have some blistering underneath the draping unfortunately although this is definitely something that has been noted on other patients previously is a fairly common occurrence. Nonetheless the patient seems to be doing fairly well in general in my pinion based on what I  see at this time. I do believe these are fairly superficial and minor. 05/07/18 on evaluation today patient's wound actually appears to be doing excellent at this point in time. He has been tolerating the Wound VAC decently well he states that it is somewhat cumbersome to carry around unfortunately. The only other issue he's been having according to  family is that they been having a difficult time keeping the Wound VAC in place and doing what is supposed to do without making. Obviously I do think that this is definitely of concern. Nonetheless I do believe she's made good progress up to this point. I'm very happy in that regard. Electronic Signature(s) Signed: 05/07/2018 11:53:17 PM By: Worthy Keeler PA-C Entered By: Worthy Keeler on 05/07/2018 12:39:56 [name] ([id number]) -------------------------------------------------------------------------------- Physical Exam Details Patient Name: JAI, STEIL Date of Service: 05/07/2018 9:15 AM Medical Record Number: [id number] Patient Account Number: [id number] Date of Birth/Sex: May 30, 1931 (84 y.o. M) Treating RN: Roger Shelter Primary Care Provider: Burman Freestone Other Clinician: Referring Provider: Burman Freestone Treating Provider/Extender: STONE III, HOYT Weeks in Treatment: 14 Constitutional Well-nourished and well-hydrated in no acute distress. Respiratory normal breathing without difficulty. Psychiatric this patient is able to make decisions and demonstrates good insight into disease process. Alert and Oriented x 3. pleasant and cooperative. Notes At this point I'm gonna suggest that we go ahead and sharply debride the wound this was discussed with patient and the family he tolerated this without complication post debridement the wound bed appears to be doing much better this is excellent news. In general I'm very happy with the progress he seems to be making. Nonetheless understand the Wound VAC has been given a lot of trouble as far as beeping due to breaking seal is concerned. Nonetheless I think that we might be able to put the Wound VAC on hold for a week to see if there's anything further that we could do to have this continue to heal well without evidence or issue of slowing the healing process. Electronic Signature(s) Signed: 05/07/2018 11:53:17 PM  By: Worthy Keeler PA-C Entered By: Worthy Keeler on 05/07/2018 12:40:52 [name] ([id number]) -------------------------------------------------------------------------------- Physician Orders Details Patient Name: BRAIDON, CHERMAK Date of Service: 05/07/2018 9:15 AM Medical Record Number: [id number] Patient Account Number: [id number] Date of Birth/Sex: 09-15-1931 (84 y.o. M) Treating RN: Roger Shelter Primary Care Provider: Burman Freestone Other Clinician: Referring Provider: Burman Freestone Treating Provider/Extender: [name], HOYT Weeks in Treatment: 12 Verbal / Phone Orders: No Diagnosis Coding ICD-10 Coding Code Description L89.154 Pressure ulcer of sacral region, stage 4 L24.0 Irritant contact dermatitis due to detergents Wound Cleansing Wound #1 Sacrum o Clean wound with Normal Saline. Anesthetic (add to Medication List) Wound #1 Sacrum o Topical Lidocaine 4% cream applied to wound bed prior to debridement (In Clinic Only). Skin Barriers/Peri-Wound Care Wound #1 Sacrum o Skin Prep o Triamcinolone Acetonide Ointment (TCA) - cream in clinic on red areas around wound where tape has irritated skin Provider to order gel for home use by [facility] Primary Wound Dressing Wound #1 Sacrum o Silver Collagen Secondary Dressing Wound #1 Sacrum o Boardered Foam Dressing Dressing Change Frequency Wound #1 Sacrum o Change Dressing Monday, Wednesday, Friday - HHRN to provide wound care on Wednesday and Friday. Patient to come to clinic on Monday. Follow-up Appointments Wound #1 Sacrum o Return Appointment in 1 week. Off-Loading Wound #1 Sacrum o Mattress - hospital bed o Turn and reposition every 2 hours o Other: -  please make sure the pt is floating his heels while lying in bed ARLOW, SPIERS. ([id number]) Additional Orders / Instructions Wound #1 Sacrum o Increase protein intake. Home Health Wound #1 [place]  Visits [facility] to provide wound care on Wednesday and Friday patient comes to clinic on [place] Nurse may visit PRN to address patientos wound care needs. o FACE TO FACE ENCOUNTER: MEDICARE and MEDICAID PATIENTS: I certify that this patient is under my care and that I had a face-to-face encounter that meets the physician face-to-face encounter requirements with this patient on this date. The encounter with the patient was in whole or in part for the following MEDICAL CONDITION: (primary reason for [place]) MEDICAL NECESSITY: I certify, that based on my findings, NURSING services are a medically necessary home health service. HOME BOUND STATUS: I certify that my clinical findings support that this patient is homebound (i.e., Due to illness or injury, pt requires aid of supportive devices such as crutches, cane, wheelchairs, walkers, the use of special transportation or the assistance of another person to leave their place of residence. There is a normal inability to leave the home and doing so requires considerable and taxing effort. Other absences are for medical reasons / religious services and are infrequent or of short duration when for other reasons). o If current dressing causes regression in wound condition, may D/C ordered dressing product/s and apply Normal Saline Moist Dressing daily until next [place] / Other MD appointment. [place] of regression in wound condition at [phone]. o Please direct any NON-WOUND related issues/requests for orders to patient's Primary Care Physician Negative Pressure Wound Therapy Wound #1 Sacrum o Place NPWT on HOLD. - HOLD NPWT until further MD orders Electronic Signature(s) Signed: 05/07/2018 5:25:05 PM By: Roger Shelter Signed: 05/07/2018 11:53:17 PM By: Worthy Keeler PA-C Entered By: Roger Shelter on 05/07/2018 10:22:11 [name]  ([id number]) -------------------------------------------------------------------------------- Problem List Details Patient Name: EARSEL, SHOUSE. Date of Service: 05/07/2018 9:15 AM Medical Record Number: [id number] Patient Account Number: [id number] Date of Birth/Sex: 16-May-1931 (84 y.o. M) Treating RN: Roger Shelter Primary Care Provider: Burman Freestone Other Clinician: Referring Provider: Burman Freestone Treating Provider/Extender: [name], HOYT Weeks in Treatment: 12 Active Problems ICD-10 Evaluated Encounter Code Description Active Date Today Diagnosis L89.154 Pressure ulcer of sacral region, stage 4 02/06/2018 No Yes L24.0 Irritant contact dermatitis due to detergents 02/06/2018 No Yes Inactive Problems Resolved Problems Electronic Signature(s) Signed: 05/07/2018 11:53:17 PM By: Worthy Keeler PA-C Entered By: Worthy Keeler on 05/07/2018 10:10:53 [name] ([id number]) -------------------------------------------------------------------------------- Progress Note Details Patient Name: [name] Date of Service: 05/07/2018 9:15 AM Medical Record Number: [id number] Patient Account Number: [id number] Date of Birth/Sex: 14-Aug-1931 (84 y.o. M) Treating RN: Roger Shelter Primary Care Provider: Burman Freestone Other Clinician: Referring Provider: Burman Freestone Treating Provider/Extender: [name], HOYT Weeks in Treatment: 12 Subjective Chief Complaint Information obtained from Patient Sacral pressure ulcer and left shoulder rash History of Present Illness (HPI) 02/06/18 on evaluation today patient presents for initial evaluation and our clinic concerning an issue which began roughly 3 weeks ago when the patient fell in his home on the floor in his kitchen and laid him down this detergent for roughly 3 days. He had a pressure injury to the left shoulder. This unfortunately has caused him a lot of discomfort although it finally seems to be doing better  if anything is really having a  lot of itching right now. This appears potentially be a contact dermatitis issue. He also has a significant pressure injury to the sacrum at this time as well which is also showing fascia exposure right over the bone but no evidence of bone exposure at this point which is good news. They have been using Santyl as well as Saline soaked gauze at this point in time. There does appear to be a lot of necrotic slough in the base of the wound. He does have a history of incontinence, myocardial infarction, and hypertension. He also is "borderline diabetic" hemoglobin A1c of 6.0. Currently he has some discomfort in the pressure site at the sacrum but fortunately nothing too significant this did require sharp debridement today. 02/13/18 on evaluation today patient appears to be doing much better in regard to his sacral wound. He has been tolerating the dressing changes without complication with the Vashe. Fortunately there is no evidence of infection and though there is some Slough on the surface of the wound bed he has excellent granulation noted. Overall I'm pleased with how things have progressed in that regard. A glance at his shoulder as well and the rash seems to be someone improving in my pinion at this site as well. Overall I am pleased with what we're seeing and so is the family. 02/20/18 on evaluation today patient appears to be doing a little worse in regard to the sacral wound only in the fact that there is redness surrounding it has me somewhat concerned for infection. The drainage has also apparently been a little bit off color compared to normal according to family they did keep the dressing today that was removed to show me and I agree this seems to be a little bit different compared to what we have been seeing. Coupled with the redness I'm concerned he may be developing some cellulitis surrounding the wound bed. 02/27/18 on evaluation today patient presents for  follow-up concerning his sacral ulcer. We have received the results back from his wound culture which shows unfortunately that the doxycycline will not be of benefit for him I am going to need to initiate treatment with something else in order to treat the pseudomonas. Otherwise he does not seem to be having any significant pain although his daughter states there are sometimes when he states having pain. We continue to use the Vashe currently. 03/06/18 on evaluation today patient's sacral wound appears to be doing better in my opinion. He has been tolerating the dressing changes without complication. With that being said the silver nitrate has helped with the prominent area of hyper granulation at the 6 o'clock location we will likely need to repeat this again today. Nonetheless overall I am pleased with how things have improved over the last week. The erythema surrounding the wound seems to be greatly improved. 03/13/18 on evaluation today patient's wound actually does not appear to be terribly infected although she does continue to have erythema surrounding the wound bed especially on the left border. I'm still somewhat concerned about the fact that the oral antibiotics alone may not be completely treating his infection. I previously discussed may need to go for IV antibiotic therapy I'm concerned that may be the case. We will need to make a referral today for infectious disease. 03/20/18 on evaluation today the patient sacral wound actually appears to be doing fairly well in regard to granulation although he continues unfortunately to have it your theme is surrounding the periwound region. There's also some increased  swelling at the 6 o'clock location which also has me somewhat concerned. With that being said he does have some discomfort but JAKEIM, SEDORE. ([id number]) nothing too significant at this point. He still has not heard from infectious disease his daughter and wife are both present during  the office visit today they're going to check back with this again. We did get the information for them to call them today. 03/27/18 on evaluation today patient is seen concerning his ongoing sacral ulcer. He has been tolerating the dressing changes without complication. With that being said he does present with evidence of bright green drainage noted on the dressing which again is something that I do often expect to see with a pseudomonas infection. He continues to have your theme is surrounding the wound bed as well and again I'm not 100% convinced this is just pressure related. I did speak with Colletta Maryland who is the nurse practitioner in [place] with infectious disease. I spoke with her actually yesterday concerning this patient. She is not 100% convinced that this is infected. She question whether the wound may just be colonized with Pseudomonas and not actually causing an active infection. I am really not thinking that the edema is associated with pressure alone and again overall I don't feel that your theme and is consistent with a pressure injury either as he's never had any contusions noted like a deep tissue injury on his heel which was new and I did visualize today this was on the left heel. Nonetheless she wanted to give this a little bit more time and thought it would be appropriate to start the Wound VAC at this point. 04/03/18 on evaluation today patient sacral ulcer actually appears to be doing fairly well at this point. He has been tolerating the dressing changes without complication. With that being said I'm very pleased with the progress that has been made in regard to his sacral wound over the past week I do not see as much in the way of erythema which is great news. Nonetheless he does have a small area of hyper granulation unfortunately. This is at roughly the 7 o'clock location and I think does need to be addressed so that this will heal more appropriately. Nonetheless I think  we may be ready to go ahead and initiate therapy with the Wound VAC. 04/10/18 on evaluation today patient appears to be doing excellent in regard to his sacral ulcer. The show signs of great improvement in overall I'm very pleased with how things look. He has been tolerating the dressing changes without complication. Specifically this is the Wound VAC. He also seems to be doing well with the antibiotic there is decreased your theme and redness surrounding the sacral area at this point in the wound has filled in quite significantly. 04/17/18 on evaluation today patient actually appears to be doing excellent in regard to his sacral ulcer. He's been tolerating the dressing changes without complication specifically the Wound VAC. There really are no major concerns from the patient nor family this point he is having no pain. He does have a little bit of Epiboly on the lateral portions of the wound where he does have a little bit more depth that will need to be addressed today. 04/23/18 on evaluation today patient's wound actually appears to be doing excellent at this point. He has been tolerating the Wound VAC and this appears to be doing well other than the fact that it seems to be breaking seal at the 6  o'clock location. I do believe that adding a duodenum dressing at this location try and help maintain the seal would be appropriate and likely very effective. With that being said he overall seems to be showing signs of good improvement at this point. There does not appear to be any evidence of significant infection which is also excellent news. 04/30/18 on evaluation today patient actually appears to be doing well in regard to his sacral ulcer. He's been tolerating the dressing changes without complication. Fortunately there does not appear to be any evidence of infection. Overall I'm very pleased with the progress that has been made up to this point. He does have some blistering underneath the  draping unfortunately although this is definitely something that has been noted on other patients previously is a fairly common occurrence. Nonetheless the patient seems to be doing fairly well in general in my pinion based on what I see at this time. I do believe these are fairly superficial and minor. 05/07/18 on evaluation today patient's wound actually appears to be doing excellent at this point in time. He has been tolerating the Wound VAC decently well he states that it is somewhat cumbersome to carry around unfortunately. The only other issue he's been having according to family is that they been having a difficult time keeping the Wound VAC in place and doing what is supposed to do without making. Obviously I do think that this is definitely of concern. Nonetheless I do believe she's made good progress up to this point. I'm very happy in that regard. Patient History Information obtained from Patient. Family History Cancer - Paternal Grandparents, Diabetes - Father, Heart Disease - Mother,Father, Stroke - Father, No family history of Hypertension, Kidney Disease, Lung Disease, Seizures, Thyroid Problems, Tuberculosis. Social History LONNIE, RETH ([id number]) Never smoker, Marital Status - Widowed, Alcohol Use - Never, Drug Use - No History, Caffeine Use - Daily. Medical History Hospitalization/Surgery History - 01/20/2018, [facility], [facility]. Medical And Surgical History Notes Endocrine Borderline Oncologic Melanoma on back Review of Systems (ROS) Constitutional Symptoms (General Health) Denies complaints or symptoms of Fatigue, Fever, Chills. Respiratory The patient has no complaints or symptoms. Cardiovascular The patient has no complaints or symptoms. Psychiatric The patient has no complaints or symptoms. Objective Constitutional Well-nourished and well-hydrated in no acute distress. Vitals Time Taken: 9:50 AM, Height: 69 in, Weight: 155 lbs, BMI: 22.9, Temperature: 98.2 F,  Pulse: 62 bpm, Respiratory Rate: 18 breaths/min, Blood Pressure: 154/65 mmHg. Respiratory normal breathing without difficulty. Psychiatric this patient is able to make decisions and demonstrates good insight into disease process. Alert and Oriented x 3. pleasant and cooperative. General Notes: At this point I'm gonna suggest that we go ahead and sharply debride the wound this was discussed with patient and the family he tolerated this without complication post debridement the wound bed appears to be doing much better this is excellent news. In general I'm very happy with the progress he seems to be making. Nonetheless understand the Wound VAC has been given a lot of trouble as far as beeping due to breaking seal is concerned. Nonetheless I think that we might be able to put the Wound VAC on hold for a week to see if there's anything further that we could do to have this continue to heal well without evidence or issue of slowing the healing process. Integumentary (Hair, Skin) Wound #1 status is Open. Original cause of wound was Pressure Injury. The wound is located on the Sacrum. The wound measures  1.5cm length x 1.5cm width x 0.3cm depth; 1.767cm^2 area and 0.53cm^3 volume. There is Fat Layer (Subcutaneous Tissue) Exposed exposed. There is no tunneling or undermining noted. There is a large amount of purulent drainage noted. The wound margin is distinct with the outline attached to the wound base. There is medium (34-66%) TAYSHAUN, KROH. ([id number]) granulation within the wound bed. There is a medium (34-66%) amount of necrotic tissue within the wound bed including Adherent Slough. The periwound skin appearance exhibited: Excoriation, Erythema. The periwound skin appearance did not exhibit: Callus, Crepitus, Induration, Rash, Scarring, Dry/Scaly, Maceration, Atrophie Blanche, Cyanosis, Ecchymosis, Hemosiderin Staining, Mottled, Pallor, Rubor. The surrounding wound skin color is noted with  erythema which is circumferential. Periwound temperature was noted as No Abnormality. The periwound has tenderness on palpation. Assessment Active Problems ICD-10 Pressure ulcer of sacral region, stage 4 Irritant contact dermatitis due to detergents Procedures Wound #1 Pre-procedure diagnosis of Wound #1 is a Pressure Ulcer located on the Sacrum . There was a Excisional Skin/Subcutaneous Tissue Debridement with a total area of 2.25 sq cm performed by STONE III, HOYT E., PA-C. With the following instrument(s): Curette to remove Viable and Non-Viable tissue/material. Material removed includes Subcutaneous Tissue, Slough, and Biofilm after achieving pain control using Other (lidocaine 4%). No specimens were taken. A time out was conducted at 10:14, prior to the start of the procedure. A Moderate amount of bleeding was controlled with Pressure. The procedure was tolerated well with a pain level of 0 throughout and a pain level of 0 following the procedure. Patient s Level of Consciousness post procedure was recorded as Awake and Alert. Post Debridement Measurements: 1.5cm length x 1.5cm width x 0.3cm depth; 0.53cm^3 volume. Post debridement Stage noted as Category/Stage III. Character of Wound/Ulcer Post Debridement is stable. Post procedure Diagnosis Wound #1: Same as Pre-Procedure Plan Wound Cleansing: Wound #1 Sacrum: Clean wound with Normal Saline. Anesthetic (add to Medication List): Wound #1 Sacrum: Topical Lidocaine 4% cream applied to wound bed prior to debridement (In Clinic Only). Skin Barriers/Peri-Wound Care: Wound #1 Sacrum: Skin Prep Triamcinolone Acetonide Ointment (TCA) - cream in clinic on red areas around wound where tape has irritated skin Provider to order gel for home use by [facility] Primary Wound Dressing: Wound #1 Sacrum: Silver Collagen Secondary Dressing: DOYCE, SALING ([id number]) Wound #1 Sacrum: Boardered Foam Dressing Dressing Change Frequency: Wound #1  Sacrum: Change Dressing Monday, Wednesday, Friday - [facility] to provide wound care on Wednesday and Friday. Patient to come to clinic on Monday. Follow-up Appointments: Wound #1 Sacrum: Return Appointment in 1 week. Off-Loading: Wound #1 Sacrum: Mattress - hospital bed Turn and reposition every 2 hours Other: - please make sure the pt is floating his heels while lying in bed Additional Orders / Instructions: Wound #1 Sacrum: Increase protein intake. Home Health: Wound #1 Sacrum: Continue Home Health Visits [facility] to provide wound care on Wednesday and Friday patient comes to clinic on [facility] Nurse may visit PRN to address patient s wound care needs. FACE TO FACE ENCOUNTER: MEDICARE and MEDICAID PATIENTS: I certify that this patient is under my care and that I had a face-to-face encounter that meets the physician face-to-face encounter requirements with this patient on this date. The encounter with the patient was in whole or in part for the following MEDICAL CONDITION: (primary reason for Conner) MEDICAL NECESSITY: I certify, that based on my findings, NURSING services are a medically necessary home health service. HOME BOUND STATUS: I certify  that my clinical findings support that this patient is homebound (i.e., Due to illness or injury, pt requires aid of supportive devices such as crutches, cane, wheelchairs, walkers, the use of special transportation or the assistance of another person to leave their place of residence. There is a normal inability to leave the home and doing so requires considerable and taxing effort. Other absences are for medical reasons / religious services and are infrequent or of short duration when for other reasons). If current dressing causes regression in wound condition, may D/C ordered dressing product/s and apply Normal Saline Moist Dressing daily until next Nunez / Other MD appointment. [place] of regression in wound condition at [phone]. Please direct any NON-WOUND related issues/requests for orders to patient's Primary Care Physician Negative Pressure Wound Therapy: Wound #1 Sacrum: Place NPWT on HOLD. - HOLD NPWT until further MD orders Currently I'm in a suggest that we initiate a collagen dressing for the next week patient and his family are in agreement with plan. We will subsequently see were things stand at follow-up in one weeks time. If anything changes or worsens they will contact the office and let me know. Otherwise hopefully he will continue to show signs of improvement. Please see above for specific wound care orders. We will see patient for re-evaluation in 1 week(s) here in the clinic. If anything worsens or changes patient will contact our office for additional recommendations. Electronic Signature(s) Signed: 05/07/2018 11:53:17 PM By: Worthy Keeler PA-C Entered By: Worthy Keeler on 05/07/2018 12:41:22 [name] ([id number]) -------------------------------------------------------------------------------- ROS/PFSH Details Patient Name: [name] Date of Service: 05/07/2018 9:15 AM Medical Record Number: [id number] Patient Account Number: [id number] Date of Birth/Sex: [birth year] (84 y.o. M) Treating RN: Roger Shelter Primary Care Provider: Burman Freestone Other Clinician: Referring Provider: Burman Freestone Treating Provider/Extender: [name], HOYT Weeks in Treatment: 12 Information Obtained From Patient Wound History Do you currently have one or more open woundso Yes How many open wounds do you currently haveo 2 Approximately how long have you had your woundso 3 weeks Has your wound(s) ever healed and then re-openedo Yes Have you had any lab work done in the past montho Yes Have you tested positive for osteomyelitis (bone infection)o No Have you had any tests for circulation on your legso No Constitutional Symptoms  (General Health) Complaints and Symptoms: Negative for: Fatigue; Fever; Chills Eyes Medical History: Positive for: Cataracts - bilateral removal Negative for: Glaucoma; Optic Neuritis Ear/Nose/Mouth/Throat Medical History: Negative for: Chronic sinus problems/congestion; Middle ear problems Hematologic/Lymphatic Medical History: Negative for: Anemia; Hemophilia; Human Immunodeficiency Virus; Lymphedema; Sickle Cell Disease Respiratory Complaints and Symptoms: No Complaints or Symptoms Medical History: Positive for: Asthma Negative for: Aspiration; Chronic Obstructive Pulmonary Disease (COPD); Pneumothorax; Sleep Apnea; Tuberculosis Cardiovascular Complaints and Symptoms: No Complaints or Symptoms Medical History: Positive for: Angina; Arrhythmia; Coronary Artery Disease; Hypertension; Myocardial Infarction - 2001 Negative for: Congestive Heart Failure; Deep Vein Thrombosis; Hypotension; Peripheral Arterial Disease; Peripheral VINOD, MIKESELL ([id number]) Venous Disease; Phlebitis; Vasculitis Gastrointestinal Medical History: Negative for: Cirrhosis ; Colitis; Crohnos; Hepatitis A; Hepatitis B; Hepatitis C Endocrine Medical History: Negative for: Type I Diabetes; Type II Diabetes Past Medical History Notes: Borderline Genitourinary Medical History: Negative for: End Stage Renal Disease Immunological Medical History: Negative for: Lupus Erythematosus; Raynaudos; Scleroderma Integumentary (Skin) Medical History: Negative for: History of Burn; History of pressure wounds Musculoskeletal Medical History: Positive for: Osteoarthritis Negative for: Gout; Rheumatoid Arthritis; Osteomyelitis Neurologic Medical History: Positive for:  Dementia; Neuropathy Negative for: Quadriplegia; Paraplegia; Seizure Disorder Oncologic Medical History: Negative for: Received Chemotherapy; Received Radiation Past Medical History Notes: Melanoma on back Psychiatric Complaints and  Symptoms: No Complaints or Symptoms Medical History: Negative for: Anorexia/bulimia; Confinement Anxiety HBO Extended History Items Eyes: Cataracts FERMON, URETA ([id number]) Immunizations Pneumococcal Vaccine: Received Pneumococcal Vaccination: Yes Implantable Devices Hospitalization / Surgery History Name of Hospital Purpose of Hospitalization/Surgery Date [facility] 01/20/2018 Family and Social History Cancer: Yes - Paternal Grandparents; Diabetes: Yes - Father; Heart Disease: Yes - Mother,Father; Hypertension: No; Kidney Disease: No; Lung Disease: No; Seizures: No; Stroke: Yes - Father; Thyroid Problems: No; Tuberculosis: No; Never smoker; Marital Status - Widowed; Alcohol Use: Never; Drug Use: No History; Caffeine Use: Daily; Financial Concerns: No; Food, Clothing or Shelter Needs: No; Support System Lacking: No; Transportation Concerns: No; Advanced Directives: Yes (Not Provided); Patient does not want information on Advanced Directives; Do not resuscitate: Yes (Not Provided); Living Will: Yes (Not Provided); Medical Power of Attorney: Yes - Corney Knighton (Not Provided) Physician Affirmation I have reviewed and agree with the above information. Electronic Signature(s) Signed: 05/07/2018 5:25:05 PM By: Roger Shelter Signed: 05/07/2018 11:53:17 PM By: Worthy Keeler PA-C Entered By: Worthy Keeler on 05/07/2018 12:40:14 [name] ([id number]) -------------------------------------------------------------------------------- SuperBill Details Patient Name: [name] Date of Service: 05/07/2018 Medical Record Number: [id number] Patient Account Number: [id number] Date of Birth/Sex: [birth year] (84 y.o. M) Treating RN: Roger Shelter Primary Care Provider: Burman Freestone Other Clinician: Referring Provider: Burman Freestone Treating Provider/Extender: [name], HOYT Weeks in Treatment: 12 Diagnosis Coding ICD-10 Codes Code Description L89.154 Pressure ulcer  of sacral region, stage 4 L24.0 Irritant contact dermatitis due to detergents Facility Procedures CPT4 Code: [id number] Description: 83382 - DEB SUBQ TISSUE 20 SQ CM/< ICD-10 Diagnosis Description L89.154 Pressure ulcer of sacral region, stage 4 Modifier: Quantity: 1 Physician Procedures CPT4 Code: [id number] Description: 73419 - WC PHYS SUBQ TISS 20 SQ CM ICD-10 Diagnosis Description L89.154 Pressure ulcer of sacral region, stage 4 Modifier: Quantity: 1 Electronic Signature(s) Signed: 05/07/2018 11:53:17 PM By: Worthy Keeler PA-C Entered By: Worthy Keeler on 05/07/2018 12:41:29

## 2018-05-08 NOTE — Progress Notes (Signed)
AASIM, RESTIVO ([id number]) Visit Report for 05/07/2018 Arrival Information Details Patient Name: [name], [name]. Date of Service: 05/07/2018 9:15 AM Medical Record Number: [id number] Patient Account Number: [id number] Date of Birth/Sex: 05-14-1931 (84 y.o. M) Treating RN: Secundino Ginger Primary Care Kym Scannell: Burman Freestone Other Clinician: Referring Fady Stamps: Burman Freestone Treating Katrese Shell/Extender: [name], HOYT Weeks in Treatment: 12 Visit Information History Since Last Visit Added or deleted any medications: No Patient Arrived: [place] Any new allergies or adverse reactions: No Arrival Time: 09:50 Had a fall or experienced change in No Accompanied By: family activities of daily living that may affect Transfer Assistance: None risk of falls: Patient Identification Verified: Yes Signs or symptoms of abuse/neglect since last visito No Secondary Verification Process Completed: Yes Hospitalized since last visit: No Patient Requires Transmission-Based Precautions: No Pain Present Now: No Patient Has Alerts: Yes Electronic Signature(s) Signed: 05/07/2018 4:33:51 PM By: Secundino Ginger Entered By: Secundino Ginger on 05/07/2018 09:50:52 [name] ([id number]) -------------------------------------------------------------------------------- Encounter Discharge Information Details Patient Name: [name] Date of Service: 05/07/2018 9:15 AM Medical Record Number: [id number] Patient Account Number: [id number] Date of Birth/Sex: 02/22/1931 (84 y.o. M) Treating RN: Montey Hora Primary Care Tyquez Hollibaugh: Burman Freestone Other Clinician: Referring Maylene Crocker: Burman Freestone Treating Izaak Sahr/Extender: [name], HOYT Weeks in Treatment: 12 Encounter Discharge Information Items Discharge Condition: Stable Ambulatory Status: Cane Discharge Destination: Home Transportation: Private Auto Accompanied By: family Schedule Follow-up Appointment: Yes Clinical Summary of  Care: Electronic Signature(s) Signed: 05/07/2018 11:01:56 AM By: Montey Hora Entered By: Montey Hora on 05/07/2018 11:01:56 [name] ([id number]) -------------------------------------------------------------------------------- Lower Extremity Assessment Details Patient Name: [name] Date of Service: 05/07/2018 9:15 AM Medical Record Number: [id number] Patient Account Number: [id number] Date of Birth/Sex: 1931/06/16 (84 y.o. M) Treating RN: Secundino Ginger Primary Care Dequarius Jeffries: Burman Freestone Other Clinician: Referring Caya Soberanis: Burman Freestone Treating Camila Maita/Extender: [name], HOYT Weeks in Treatment: 12 Electronic Signature(s) Signed: 05/07/2018 4:33:51 PM By: Secundino Ginger Entered By: Secundino Ginger on 05/07/2018 09:52:28 [name] ([id number]) -------------------------------------------------------------------------------- Multi Wound Chart Details Patient Name: [name] Date of Service: 05/07/2018 9:15 AM Medical Record Number: [id number] Patient Account Number: [id number] Date of Birth/Sex: 07/14/1931 (84 y.o. M) Treating RN: Roger Shelter Primary Care Charles Andringa: Burman Freestone Other Clinician: Referring Lollie Gunner: Burman Freestone Treating Floella Ensz/Extender: [name], HOYT Weeks in Treatment: 12 Vital Signs Height(in): 69 Pulse(bpm): 61 Weight(lbs): 155 Blood Pressure(mmHg): 154/65 Body Mass Index(BMI): 23 Temperature(F): 98.2 Respiratory Rate 18 (breaths/min): Photos: [N/A:N/A] Wound Location: Sacrum N/A N/A Wounding Event: Pressure Injury N/A N/A Primary Etiology: Pressure Ulcer N/A N/A Comorbid History: Cataracts, Asthma, Angina, N/A N/A Arrhythmia, Coronary Artery Disease, Hypertension, Myocardial Infarction, Osteoarthritis, Dementia, Neuropathy Date Acquired: 01/17/2018 N/A N/A Weeks of Treatment: 12 N/A N/A Wound Status: Open N/A N/A Measurements L x W x D 1.5x1.5x0.3 N/A N/A (cm) Area (cm) : 1.767 N/A N/A Volume  (cm) : 0.53 N/A N/A % Reduction in Area: 91.30% N/A N/A % Reduction in Volume: 98.70% N/A N/A Classification: Category/Stage III N/A N/A Exudate Amount: Large N/A N/A Exudate Type: Purulent N/A N/A Exudate Color: yellow, brown, green N/A N/A Wound Margin: Distinct, outline attached N/A N/A Granulation Amount: Medium (34-66%) N/A N/A Necrotic Amount: Medium (34-66%) N/A N/A Exposed Structures: Fat Layer (Subcutaneous N/A N/A Tissue) Exposed: Yes Fascia: No Tendon: No Muscle: No [name], [name] ([id number]) Joint: No Bone: No Epithelialization: None N/A N/A Periwound Skin Texture: Excoriation: Yes N/A N/A Induration: No Callus: No Crepitus: No Rash: No Scarring: No Periwound Skin Moisture: Maceration: No  N/A N/A Dry/Scaly: No Periwound Skin Color: Erythema: Yes N/A N/A Atrophie Blanche: No Cyanosis: No Ecchymosis: No Hemosiderin Staining: No Mottled: No Pallor: No Rubor: No Erythema Location: Circumferential N/A N/A Temperature: No Abnormality N/A N/A Tenderness on Palpation: Yes N/A N/A Wound Preparation: Ulcer Cleansing: N/A N/A Rinsed/Irrigated with Saline Topical Anesthetic Applied: Other: lidocaine 4% Treatment Notes Electronic Signature(s) Signed: 05/07/2018 5:25:05 PM By: Roger Shelter Entered By: Roger Shelter on 05/07/2018 10:12:28 [name] ([id number]) -------------------------------------------------------------------------------- [place] Details Patient Name: [name], [name]. Date of Service: 05/07/2018 9:15 AM Medical Record Number: [id number] Patient Account Number: [id number] Date of Birth/Sex: 1931/03/28 (84 y.o. M) Treating RN: Roger Shelter Primary Care Joydan Gretzinger: Burman Freestone Other Clinician: Referring Tyrica Afzal: Burman Freestone Treating Adalina Dopson/Extender: [name], HOYT Weeks in Treatment: 12 Active Inactive ` Abuse / Safety / Falls / Self Care Management Nursing Diagnoses: History of  Falls Goals: Patient will not experience any injury related to falls Date Initiated: 02/06/2018 Target Resolution Date: 03/08/2018 Goal Status: Active Interventions: Assess fall risk on admission and as needed Treatment Activities: Patient referred to home care : 02/06/2018 Notes: ` Necrotic Tissue Nursing Diagnoses: Impaired tissue integrity related to necrotic/devitalized tissue Goals: Necrotic/devitalized tissue will be minimized in the wound bed Date Initiated: 02/06/2018 Target Resolution Date: 03/08/2018 Goal Status: Active Interventions: Assess patient pain level pre-, during and post procedure and prior to discharge Treatment Activities: Excisional debridement : 02/06/2018 Notes: ` Nutrition Nursing Diagnoses: Potential for alteratiion in Nutrition/Potential for imbalanced nutrition [name], [name] ([id number]) Goals: Patient/caregiver agrees to and verbalizes understanding of need to obtain nutritional consultation Date Initiated: 02/06/2018 Target Resolution Date: 03/08/2018 Goal Status: Active Interventions: Provide education on nutrition Notes: ` Orientation to the Wound Care Program Nursing Diagnoses: Knowledge deficit related to the wound healing center program Goals: Patient/caregiver will verbalize understanding of the [facility] Date Initiated: 02/06/2018 Target Resolution Date: 03/08/2018 Goal Status: Active Interventions: Provide education on orientation to the wound center Notes: ` Pressure Nursing Diagnoses: Knowledge deficit related to management of pressures ulcers Goals: Patient/caregiver will verbalize understanding of pressure ulcer management Date Initiated: 02/06/2018 Target Resolution Date: 03/08/2018 Goal Status: Active Interventions: Assess offloading mechanisms upon admission and as needed Provide education on pressure ulcers Notes: ` Wound/Skin Impairment Nursing Diagnoses: Knowledge deficit related to  ulceration/compromised skin integrity Goals: Ulcer/skin breakdown will have a volume reduction of 80% by week 12 Date Initiated: 02/06/2018 Target Resolution Date: 04/08/2018 Goal Status: Active [name], [name] ([id number]) Interventions: Assess ulceration(s) every visit Treatment Activities: Skin care regimen initiated : 02/06/2018 Topical wound management initiated : 02/06/2018 Notes: Electronic Signature(s) Signed: 05/07/2018 5:25:05 PM By: Roger Shelter Entered By: Roger Shelter on 05/07/2018 10:12:13 [name] ([id number]) -------------------------------------------------------------------------------- Pain Assessment Details Patient Name: [name] Date of Service: 05/07/2018 9:15 AM Medical Record Number: [id number] Patient Account Number: [id number] Date of Birth/Sex: 1931/03/16 (84 y.o. M) Treating RN: Secundino Ginger Primary Care Zadyn Yardley: Burman Freestone Other Clinician: Referring Broedy Osbourne: Burman Freestone Treating Deanthony Maull/Extender: [name], HOYT Weeks in Treatment: 12 Active Problems Location of Pain Severity and Description of Pain Patient Has Paino No Site Locations Pain Management and Medication Current Pain Management: Electronic Signature(s) Signed: 05/07/2018 4:33:51 PM By: Secundino Ginger Entered By: Secundino Ginger on 05/07/2018 09:51:26 [name] ([id number]) -------------------------------------------------------------------------------- Patient/Caregiver Education Details Patient Name: [name] Date of Service: 05/07/2018 9:15 AM Medical Record Number: [id number] Patient Account Number: [id number] Date of Birth/Gender: 02-16-1931 (84 y.o. M) Treating RN: Montey Hora Primary Care Physician: Kenton Kingfisher,  MEREDITH Other Clinician: Referring Physician: Burman Freestone Treating Physician/Extender: Sharalyn Ink in Treatment: 12 Education Assessment Education Provided To: Patient and Caregiver Education Topics  Provided Wound/Skin Impairment: Handouts: Other: wound care as ordered Methods: Demonstration, Explain/Verbal Responses: State content correctly Electronic Signature(s) Signed: 05/07/2018 2:22:32 PM By: Montey Hora Entered By: Montey Hora on 05/07/2018 11:02:18 [name] ([id number]) -------------------------------------------------------------------------------- Wound Assessment Details Patient Name: [name] Date of Service: 05/07/2018 9:15 AM Medical Record Number: [id number] Patient Account Number: [id number] Date of Birth/Sex: 29-Aug-1931 (84 y.o. M) Treating RN: Secundino Ginger Primary Care Miyonna Ormiston: Burman Freestone Other Clinician: Referring Lamarr Feenstra: Burman Freestone Treating Jacalynn Buzzell/Extender: [name], HOYT Weeks in Treatment: 12 Wound Status Wound Number: 1 Primary Pressure Ulcer Etiology: Wound Location: Sacrum Wound Open Wounding Event: Pressure Injury Status: Date Acquired: 01/17/2018 Comorbid Cataracts, Asthma, Angina, Arrhythmia, Weeks Of Treatment: 12 History: Coronary Artery Disease, Hypertension, Clustered Wound: No Myocardial Infarction, Osteoarthritis, Dementia, Neuropathy Photos Photo Uploaded By: Secundino Ginger on 05/07/2018 10:06:17 Wound Measurements Length: (cm) 1.5 % Reduction Width: (cm) 1.5 % Reduction Depth: (cm) 0.3 Epitheliali Area: (cm) 1.767 Tunneling: Volume: (cm) 0.53 Underminin in Area: 91.3% in Volume: 98.7% zation: None No g: No Wound Description Classification: Category/Stage III Foul Odor Wound Margin: Distinct, outline attached Slough/Fib Exudate Amount: Large Exudate Type: Purulent Exudate Color: yellow, brown, green After Cleansing: No rino Yes Wound Bed Granulation Amount: Medium (34-66%) Exposed Structure Necrotic Amount: Medium (34-66%) Fascia Exposed: No Necrotic Quality: Adherent Slough Fat Layer (Subcutaneous Tissue) Exposed: Yes Tendon Exposed: No Muscle Exposed: No Joint Exposed: No Bone  Exposed: No Periwound Skin Texture Texture Color [name], [name]. ([id number]) No Abnormalities Noted: No No Abnormalities Noted: No Callus: No Atrophie Blanche: No Crepitus: No Cyanosis: No Excoriation: Yes Ecchymosis: No Induration: No Erythema: Yes Rash: No Erythema Location: Circumferential Scarring: No Hemosiderin Staining: No Mottled: No Moisture Pallor: No No Abnormalities Noted: No Rubor: No Dry / Scaly: No Maceration: No Temperature / Pain Temperature: No Abnormality Tenderness on Palpation: Yes Wound Preparation Ulcer Cleansing: Rinsed/Irrigated with Saline Topical Anesthetic Applied: Other: lidocaine 4%, Treatment Notes Wound #1 (Sacrum) 1. Cleansed with: Clean wound with Normal Saline 2. Anesthetic Topical Lidocaine 4% cream to wound bed prior to debridement 3. Peri-wound Care: Skin Prep Other peri-wound care (specify in notes) 4. Dressing Applied: Prisma Ag 5. Secondary Dressing Applied Bordered Foam Dressing Non-Adherent pad Notes TCA Electronic Signature(s) Signed: 05/07/2018 4:33:51 PM By: Secundino Ginger Entered By: Secundino Ginger on 05/07/2018 10:03:25 [name] ([id number]) -------------------------------------------------------------------------------- Vitals Details Patient Name: [name] Date of Service: 05/07/2018 9:15 AM Medical Record Number: [id number] Patient Account Number: [id number] Date of Birth/Sex: December 29, 1930 (84 y.o. M) Treating RN: Secundino Ginger Primary Care Porshe Fleagle: Burman Freestone Other Clinician: Referring Nikiesha Milford: Burman Freestone Treating Iosefa Weintraub/Extender: [name], HOYT Weeks in Treatment: 12 Vital Signs Time Taken: 09:50 Temperature (F): 98.2 Height (in): 69 Pulse (bpm): 62 Weight (lbs): 155 Respiratory Rate (breaths/min): 18 Body Mass Index (BMI): 22.9 Blood Pressure (mmHg): 154/65 Reference Range: 80 - 120 mg / dl Electronic Signature(s) Signed: 05/07/2018 4:33:51 PM By: Secundino Ginger Entered BySecundino Ginger on 05/07/2018 09:52:08

## 2018-05-14 ENCOUNTER — Encounter: Payer: Medicare Other | Admitting: Physician Assistant

## 2018-05-14 DIAGNOSIS — L89154 Pressure ulcer of sacral region, stage 4: Secondary | ICD-10-CM | POA: Diagnosis not present

## 2018-05-29 ENCOUNTER — Encounter: Payer: Medicare Other | Attending: Nurse Practitioner | Admitting: Nurse Practitioner

## 2018-05-29 DIAGNOSIS — I10 Essential (primary) hypertension: Secondary | ICD-10-CM | POA: Insufficient documentation

## 2018-05-29 DIAGNOSIS — F0390 Unspecified dementia without behavioral disturbance: Secondary | ICD-10-CM | POA: Diagnosis not present

## 2018-05-29 DIAGNOSIS — Z9842 Cataract extraction status, left eye: Secondary | ICD-10-CM | POA: Insufficient documentation

## 2018-05-29 DIAGNOSIS — Z85820 Personal history of malignant melanoma of skin: Secondary | ICD-10-CM | POA: Diagnosis not present

## 2018-05-29 DIAGNOSIS — I2510 Atherosclerotic heart disease of native coronary artery without angina pectoris: Secondary | ICD-10-CM | POA: Insufficient documentation

## 2018-05-29 DIAGNOSIS — Z9841 Cataract extraction status, right eye: Secondary | ICD-10-CM | POA: Insufficient documentation

## 2018-05-29 DIAGNOSIS — R7303 Prediabetes: Secondary | ICD-10-CM | POA: Diagnosis not present

## 2018-05-29 DIAGNOSIS — J45909 Unspecified asthma, uncomplicated: Secondary | ICD-10-CM | POA: Insufficient documentation

## 2018-05-29 DIAGNOSIS — L89153 Pressure ulcer of sacral region, stage 3: Secondary | ICD-10-CM | POA: Diagnosis not present

## 2018-05-29 DIAGNOSIS — I252 Old myocardial infarction: Secondary | ICD-10-CM | POA: Insufficient documentation

## 2018-05-29 DIAGNOSIS — L240 Irritant contact dermatitis due to detergents: Secondary | ICD-10-CM | POA: Diagnosis not present

## 2018-05-29 DIAGNOSIS — G629 Polyneuropathy, unspecified: Secondary | ICD-10-CM | POA: Insufficient documentation

## 2018-06-04 ENCOUNTER — Ambulatory Visit (INDEPENDENT_AMBULATORY_CARE_PROVIDER_SITE_OTHER): Payer: Medicare Other | Admitting: Internal Medicine

## 2018-06-04 ENCOUNTER — Ambulatory Visit: Payer: Medicare Other | Admitting: Physician Assistant

## 2018-06-04 ENCOUNTER — Encounter: Payer: Self-pay | Admitting: Internal Medicine

## 2018-06-04 DIAGNOSIS — R609 Edema, unspecified: Secondary | ICD-10-CM

## 2018-06-04 DIAGNOSIS — I480 Paroxysmal atrial fibrillation: Secondary | ICD-10-CM | POA: Diagnosis not present

## 2018-06-04 DIAGNOSIS — I25119 Atherosclerotic heart disease of native coronary artery with unspecified angina pectoris: Secondary | ICD-10-CM

## 2018-06-04 DIAGNOSIS — I2510 Atherosclerotic heart disease of native coronary artery without angina pectoris: Secondary | ICD-10-CM

## 2018-06-04 DIAGNOSIS — E782 Mixed hyperlipidemia: Secondary | ICD-10-CM

## 2018-06-04 DIAGNOSIS — I10 Essential (primary) hypertension: Secondary | ICD-10-CM

## 2018-06-04 LAB — CBC
Hematocrit: 30.5 % — ABNORMAL LOW (ref 37.5–51.0)
Hemoglobin: 9.9 g/dL — ABNORMAL LOW (ref 13.0–17.7)
MCH: 27.1 pg (ref 26.6–33.0)
MCHC: 32.5 g/dL (ref 31.5–35.7)
MCV: 84 fL (ref 79–97)
Platelets: 309 10*3/uL (ref 150–450)
RBC: 3.65 x10E6/uL — ABNORMAL LOW (ref 4.14–5.80)
RDW: 17.1 % — ABNORMAL HIGH (ref 12.3–15.4)
WBC: 7.5 10*3/uL (ref 3.4–10.8)

## 2018-06-04 LAB — TSH: TSH: 2.53 u[IU]/mL (ref 0.450–4.500)

## 2018-06-04 LAB — BASIC METABOLIC PANEL
BUN/Creatinine Ratio: 13 (ref 10–24)
BUN: 14 mg/dL (ref 8–27)
CO2: 26 mmol/L (ref 20–29)
Calcium: 9.4 mg/dL (ref 8.6–10.2)
Chloride: 102 mmol/L (ref 96–106)
Creatinine, Ser: 1.07 mg/dL (ref 0.76–1.27)
GFR, EST AFRICAN AMERICAN: 73 mL/min/{1.73_m2} (ref >59)
GFR, EST NON AFRICAN AMERICAN: 63 mL/min/{1.73_m2} (ref >59)
Glucose: 96 mg/dL (ref 65–99)
POTASSIUM: 4.6 mmol/L (ref 3.5–5.2)
Sodium: 141 mmol/L (ref 134–144)

## 2018-06-04 LAB — PRO B NATRIURETIC PEPTIDE: NT-Pro BNP: 900 pg/mL — ABNORMAL HIGH (ref 0–486)

## 2018-06-04 MED ORDER — AMLODIPINE BESYLATE 2.5 MG PO TABS: 2.5000 mg | ORAL_TABLET | Freq: Every day | ORAL | 6 refills | Status: DC

## 2018-06-04 MED ORDER — AMIODARONE HCL 200 MG PO TABS: 200.0000 mg | ORAL_TABLET | Freq: Every day | ORAL | 3 refills | Status: DC

## 2018-06-04 NOTE — Progress Notes (Signed)
Cardiology Office Note   Date:  06/04/2018   ID:  ATHEN RIEL, DOB 02-05-1931, MRN [id number]  PCP:  [name], FNP  Cardiologist:   Dorris Carnes, MD   Pt presents for f/u of CAD     History of Present Illness: [name] is a 84 y.o. male with a history of CAD s/p stent to RCA and cutting balloon angioplasty to the Dx, chronic chest pain, chronic dysphagia s/p dilations x2, HTN, HLD, and BPH who presented to the ED on 11/14/15 with CP.  Last cat cath in 2015 showed patent stent and nonobstructive disease in the diagonal (12/2013). The pt was admitted in 2016 for  RO for MI  Myovue showedInferolateral MI  No ischemia  LVEF 55%  Losartan 25 mg was started for better BP control    I saw him last in clinic in 2017   Admitted in APril 2019 after fall and decubitus    Was found to be in atrial fibrillation        He was seen by [name] in May     Since seen he denies CP   No palpitations.   Breathing is fair   Gets tired doing things   Outpatient Medications Prior to Visit  Medication Sig Dispense Refill  . acetaminophen (TYLENOL) 325 MG tablet Take 2 tablets (650 mg total) by mouth every 4 (four) hours as needed for headache or mild pain.    Marland Kitchen amiodarone (PACERONE) 200 MG tablet Take 1 tablet (200 mg total) by mouth 2 (two) times daily. 60 tablet 0  . aspirin EC 81 MG tablet Take 1 tablet (81 mg total) by mouth daily.    Marland Kitchen atorvastatin (LIPITOR) 80 MG tablet Take 1 tablet (80 mg total) by mouth daily. 90 tablet 3  . cyclobenzaprine (FLEXERIL) 5 MG tablet Take 7.5 mg by mouth 3 (three) times daily as needed for muscle spasms.     Marland Kitchen doxazosin (CARDURA) 1 MG tablet TAKE 1 TABLET BY MOUTH ONCE DAILY. 15 tablet 0  . feeding supplement, ENSURE ENLIVE, (ENSURE ENLIVE) LIQD Take 237 mLs by mouth 2 (two) times daily between meals. 237 mL 12  . feeding supplement, ENSURE ENLIVE, (ENSURE ENLIVE) LIQD Take 237 mLs by mouth 2 (two) times daily between meals. 237 mL 12  .  HYDROcodone-acetaminophen (NORCO) 7.5-325 MG tablet Take 1 tablet by mouth every 4 (four) hours as needed for moderate pain. FOR PAIN 20 tablet 0  . pantoprazole (PROTONIX) 40 MG tablet TAKE 1 TABLET BY MOUTH TWICE DAILY 30 tablet 0  . tamsulosin (FLOMAX) 0.4 MG CAPS capsule Take 0.4 mg by mouth daily.     . vitamin B-12 1000 MCG tablet Take 1 tablet (1,000 mcg total) by mouth daily. 30 tablet 0   No facility-administered medications prior to visit.      Allergies:   Budesonide-formoterol fumarate; Iohexol; Simvastatin; Demerol [meperidine]; Ivp dye [iodinated diagnostic agents]; Meperidine hcl; Tape; Naproxen; Pregabalin; and Sulfonamide derivatives   Past Medical History:  Diagnosis Date  . Arthritis    "hips, back" (06/17/2015)  . Asthma   . Atrial fibrillation ([place]) 01/21/2018  . Chronic chest pain   . Chronic lower back pain   . Coronary artery disease    a. s/p BMS to RCA in 2002; b. s/p cutting balloon POBA ;   c. cath 6/12: oDx 80% (treated with repeat cutting balloon POBA), mLAD 50% with 30-40% at Dx, CFX 30%, pRCA 25% with patent stents;  d.  Lex MV 4/14:  Low Risk - EF 61%, inf scar with peri-infarct ischemia  . Dyspnea    chronic  . Essential hypertension   . GERD (gastroesophageal reflux disease)    h/o esophageal spasm  . GI bleed 03/03/2014  . Headache   . History of blood transfusion    "related to OR"  . History of hiatal hernia   . Hyperlipidemia   . Hypertension   . Melanoma of lower back ([place]) late 1990's  . Memory loss   . Myocardial infarction ([place]) 2001   x 1, confirned 1 possible  . Pre-syncope 07/31/2017    Past Surgical History:  Procedure Laterality Date  . ANTERIOR CERVICAL DECOMP/DISCECTOMY FUSION    . BACK SURGERY  multiple  . CARDIAC CATHETERIZATION     "I've had 17 caths" (06/17/2015)  . CATARACT EXTRACTION W/ INTRAOCULAR LENS  IMPLANT, BILATERAL Bilateral   . CERVICAL DISC SURGERY  multiple  . COLONOSCOPY WITH PROPOFOL N/A 04/17/2014    Procedure: COLONOSCOPY WITH PROPOFOL;  Surgeon: Winfield Cunas., MD;  Location: [facility] ENDOSCOPY;  Service: Endoscopy;  Laterality: N/A;  . CORONARY ANGIOPLASTY WITH STENT PLACEMENT  x 2 stents    previous percutaneous intervention on the  RCA and the diagonal branch  . ESOPHAGOGASTRODUODENOSCOPY  03/23/2012   Procedure: ESOPHAGOGASTRODUODENOSCOPY (EGD);  Surgeon: Winfield Cunas., MD;  Location: [facility];  Service: Endoscopy;  Laterality: N/A;  . ESOPHAGOGASTRODUODENOSCOPY N/A 03/04/2014   Procedure: ESOPHAGOGASTRODUODENOSCOPY (EGD);  Surgeon: Arta Silence, MD;  Location: [facility] ENDOSCOPY;  Service: Endoscopy;  Laterality: N/A;  . LAMINECTOMY    . LEFT HEART CATHETERIZATION WITH CORONARY ANGIOGRAM N/A 01/13/2014   Procedure: LEFT HEART CATHETERIZATION WITH CORONARY ANGIOGRAM;  Surgeon: Blane Ohara, MD;  Location: [facility] CATH LAB;  Service: Cardiovascular;  Laterality: N/A;  . MELANOMA EXCISION  late 1990's   "lower back"  . POSTERIOR LAMINECTOMY / DECOMPRESSION CERVICAL SPINE    . SAVORY DILATION  03/23/2012   Procedure: SAVORY DILATION;  Surgeon: Winfield Cunas., MD;  Location: [facility] ENDOSCOPY;  Service: Endoscopy;  Laterality: N/A;  . TONSILLECTOMY  1930's     Social History:  The patient  reports that he has never smoked. He has never used smokeless tobacco. He reports that he does not drink alcohol or use drugs.   Family History:  The patient's family history includes Asthma in his father; Colitis in his son; Colon cancer in his son; Crohn's disease in his son; Diabetes in his father; Heart disease in his father and mother; Prostate cancer in his paternal grandfather.    ROS:  Please see the history of present illness. All other systems are reviewed and  Negative to the above problem except as noted.    PHYSICAL EXAM: VS:  BP (!) 170/66 (BP Location: Right Arm, Patient Position: Sitting, Cuff Size: Normal)   Pulse 66   Ht 5\' 9"  (1.753 m)   Wt 175 lb 6.4 oz (79.6 kg)   SpO2 96%    BMI 25.90 kg/m   GEN: Well nourished, well developed, in no acute distress  HEENT: normal  Neck: JVP is normal   No, carotid bruits, or masses Cardiac: RRR; no murmurs, rubs, or gallops,  Tr edema  Respiratory:  clear to auscultation bilaterally, normal work of breathing GI: soft, nontender, nondistended, + BS  No hepatomegaly  MS: no deformity Moving all extremities   Skin: warm and dry, no rash Neuro:  Strength and sensation are intact Psych:  euthymic mood, full affect   EKG:  EKG is ordered today.  SR 60 bpm   PR interval 202 msec   Lipid Panel    Component Value Date/Time   CHOL 126 04/12/2018 1043   TRIG 65 04/12/2018 1043   HDL 43 04/12/2018 1043   CHOLHDL 2.9 04/12/2018 1043   CHOLHDL 3 01/19/2015 1315   VLDL 14.2 01/19/2015 1315   LDLCALC 70 04/12/2018 1043      Wt Readings from Last 3 Encounters:  06/04/18 175 lb 6.4 oz (79.6 kg)  03/22/18 170 lb (77.1 kg)  02/15/18 144 lb 12.8 oz (65.7 kg)      ASSESSMENT AND PLAN:  1 CAD No symtpoms of angina   Will   2  PAF   On amiodarone   Not a good candidate for anticoagulation   Cut back to 200 daily of amiodarone   ASA Check labs today    3  HTN BP is elevated   Would add 2.5 mg amlodipine for BP   Wll need f/u  4   HL  Continue on lipitor  F/U in a few months       Current medicines are reviewed at length with the patient today.  The patient does not have concerns regarding medicines.  The following changes have been made:   Labs/ tests ordered today include: No orders of the defined types were placed in this encounter.    Disposition:   FU with  in   Signed, Dorris Carnes, MD  06/04/2018 10:38 AM    [address], [place], [place]  [address] Phone: [phone]; Fax: [phone]

## 2018-06-04 NOTE — Patient Instructions (Addendum)
Your physician has recommended you make the following change in your medication:  1.) decrease amiodarone to 200 mg ONCE A DAY 2.) start amlodipine 2.5 mg--once a day for blood pressure  Your physician recommends that you return for lab work TODAY (BMET, CBC, TSH, BNP) Your physician recommends that you schedule a follow-up appointment in: [place].  We will call you with this appointment date/time.

## 2018-06-06 ENCOUNTER — Telehealth: Payer: Self-pay | Admitting: Internal Medicine

## 2018-06-06 NOTE — Telephone Encounter (Signed)
New Message  Pt return call for results

## 2018-06-06 NOTE — Telephone Encounter (Signed)
Spoke with patient's daughter, she expressed understanding regarding his lab results. Advised to lower sodium due to increase of fluid. She had no further questions.

## 2018-06-10 NOTE — Progress Notes (Signed)
ORLA, JOLLIFF ([id number]) Visit Report for 05/29/2018 Arrival Information Details Patient Name: [name], [name]. Date of Service: 05/29/2018 9:00 AM Medical Record Number: [id number] Patient Account Number: [id number] Date of Birth/Sex: October 09, 1931 (84 y.o. M) Treating RN: Secundino Ginger Primary Care Piercen Covino: Burman Freestone Other Clinician: Referring Milton Sagona: Burman Freestone Treating Sonnie Pawloski/Extender: Cathie Olden in Treatment: 88 Visit Information History Since Last Visit Added or deleted any medications: No Patient Arrived: Cane Any new allergies or adverse reactions: No Arrival Time: 08:56 Had a fall or experienced change in No Accompanied By: dtr activities of daily living that may affect Transfer Assistance: None risk of falls: Patient Identification Verified: Yes Signs or symptoms of abuse/neglect since last visito No Secondary Verification Process Completed: Yes Hospitalized since last visit: No Patient Requires Transmission-Based Precautions: No Implantable device outside of the clinic excluding No Patient Has Alerts: Yes cellular tissue based products placed in the center since last visit: Has Dressing in Place as Prescribed: Yes Pain Present Now: No Electronic Signature(s) Signed: 05/29/2018 4:00:36 PM By: Secundino Ginger Entered By: Secundino Ginger on 05/29/2018 09:04:27 [name] ([id number]) -------------------------------------------------------------------------------- Encounter Discharge Information Details Patient Name: [name] Date of Service: 05/29/2018 9:00 AM Medical Record Number: [id number] Patient Account Number: [id number] Date of Birth/Sex: January 01, 1931 (84 y.o. M) Treating RN: Roger Shelter Primary Care Sesar Madewell: Burman Freestone Other Clinician: Referring Keshawn Fiorito: Burman Freestone Treating Muhamad Serano/Extender: Cathie Olden in Treatment: 16 Encounter Discharge Information Items Discharge Condition: Stable Ambulatory Status:  Cane Discharge Destination: Home Transportation: Private Auto Schedule Follow-up Appointment: Yes Clinical Summary of Care: Electronic Signature(s) Signed: 05/30/2018 9:21:22 AM By: Roger Shelter Entered By: Roger Shelter on 05/29/2018 09:30:27 [name] ([id number]) -------------------------------------------------------------------------------- Lower Extremity Assessment Details Patient Name: [name], [name] Date of Service: 05/29/2018 9:00 AM Medical Record Number: [id number] Patient Account Number: [id number] Date of Birth/Sex: June 30, 1931 (84 y.o. M) Treating RN: Secundino Ginger Primary Care Caydan Mctavish: Burman Freestone Other Clinician: Referring Bakary Bramer: Burman Freestone Treating Lyndell Allaire/Extender: Cathie Olden in Treatment: 16 Electronic Signature(s) Signed: 05/29/2018 4:00:36 PM By: Secundino Ginger Entered By: Secundino Ginger on 05/29/2018 09:13:17 [name] ([id number]) -------------------------------------------------------------------------------- Multi Wound Chart Details Patient Name: [name] Date of Service: 05/29/2018 9:00 AM Medical Record Number: [id number] Patient Account Number: [id number] Date of Birth/Sex: 1931/03/13 (84 y.o. M) Treating RN: Cornell Barman Primary Care Tayquan Gassman: Burman Freestone Other Clinician: Referring Sheresa Cullop: Burman Freestone Treating Yuri Flener/Extender: Cathie Olden in Treatment: 16 Vital Signs Height(in): 69 Pulse(bpm): 60 Weight(lbs): 155 Blood Pressure(mmHg): 168/62 Body Mass Index(BMI): 23 Temperature(F): 98.1 Respiratory Rate 18 (breaths/min): Photos: [N/A:N/A] Wound Location: Sacrum N/A N/A Wounding Event: Pressure Injury N/A N/A Primary Etiology: Pressure Ulcer N/A N/A Comorbid History: Cataracts, Asthma, Angina, N/A N/A Arrhythmia, Coronary Artery Disease, Hypertension, Myocardial Infarction, Osteoarthritis, Dementia, Neuropathy Date Acquired: 01/17/2018 N/A N/A Weeks of Treatment: 16 N/A  N/A Wound Status: Open N/A N/A Measurements L x W x D 1.2x2.5x0.4 N/A N/A (cm) Area (cm) : 2.356 N/A N/A Volume (cm) : 0.942 N/A N/A % Reduction in Area: 88.50% N/A N/A % Reduction in Volume: 97.70% N/A N/A Classification: Category/Stage III N/A N/A Exudate Amount: Small N/A N/A Exudate Type: Purulent N/A N/A Exudate Color: yellow, brown, green N/A N/A Wound Margin: Distinct, outline attached N/A N/A Granulation Amount: Small (1-33%) N/A N/A Granulation Quality: Pink N/A N/A Necrotic Amount: Medium (34-66%) N/A N/A Exposed Structures: Fat Layer (Subcutaneous N/A N/A Tissue) Exposed: Yes Fascia: No [name], [name] ([id number]) Tendon: No Muscle: No Joint: No Bone: No Epithelialization: None  N/A N/A Debridement: Debridement - Excisional N/A N/A Pre-procedure 09:18 N/A N/A Verification/Time Out Taken: Pain Control: Other N/A N/A Tissue Debrided: Subcutaneous N/A N/A Level: Skin/Subcutaneous Tissue N/A N/A Debridement Area (sq cm): 3 N/A N/A Instrument: Curette N/A N/A Bleeding: Minimum N/A N/A Hemostasis Achieved: Pressure N/A N/A Procedural Pain: 0 N/A N/A Post Procedural Pain: 0 N/A N/A Debridement Treatment Procedure was tolerated well N/A N/A Response: Post Debridement 1.2x2.5x0.2 N/A N/A Measurements L x W x D (cm) Post Debridement Volume: 0.471 N/A N/A (cm) Post Debridement Stage: Category/Stage III N/A N/A Periwound Skin Texture: Excoriation: Yes N/A N/A Induration: No Callus: No Crepitus: No Rash: No Scarring: No Periwound Skin Moisture: Maceration: No N/A N/A Dry/Scaly: No Periwound Skin Color: Erythema: Yes N/A N/A Atrophie Blanche: No Cyanosis: No Ecchymosis: No Hemosiderin Staining: No Mottled: No Pallor: No Rubor: No Erythema Location: Circumferential N/A N/A Temperature: No Abnormality N/A N/A Tenderness on Palpation: Yes N/A N/A Wound Preparation: Ulcer Cleansing: N/A N/A Rinsed/Irrigated with Saline Topical Anesthetic  Applied: Other: lidocaine 4% Procedures Performed: Debridement N/A N/A Treatment Notes Electronic Signature(s) Signed: 05/29/2018 9:22:26 AM By: Lawanda Cousins Entered By: Lawanda Cousins on 05/29/2018 09:22:25 [name], [name] ([id number]) [name], [name] ([id number]) -------------------------------------------------------------------------------- Multi-Disciplinary Care Plan Details Patient Name: [name], [name] Date of Service: 05/29/2018 9:00 AM Medical Record Number: [id number] Patient Account Number: [id number] Date of Birth/Sex: 09-19-1931 (84 y.o. M) Treating RN: Cornell Barman Primary Care Rakeb Kibble: Burman Freestone Other Clinician: Referring Maria Coin: Burman Freestone Treating Azul Coffie/Extender: Cathie Olden in Treatment: 16 Active Inactive ` Abuse / Safety / Falls / Self Care Management Nursing Diagnoses: History of Falls Goals: Patient will not experience any injury related to falls Date Initiated: 02/06/2018 Target Resolution Date: 03/08/2018 Goal Status: Active Interventions: Assess fall risk on admission and as needed Treatment Activities: Patient referred to home care : 02/06/2018 Notes: ` Necrotic Tissue Nursing Diagnoses: Impaired tissue integrity related to necrotic/devitalized tissue Goals: Necrotic/devitalized tissue will be minimized in the wound bed Date Initiated: 02/06/2018 Target Resolution Date: 03/08/2018 Goal Status: Active Interventions: Assess patient pain level pre-, during and post procedure and prior to discharge Treatment Activities: Excisional debridement : 02/06/2018 Notes: ` Nutrition Nursing Diagnoses: Potential for alteratiion in Nutrition/Potential for imbalanced nutrition [name], [name] ([id number]) Goals: Patient/caregiver agrees to and verbalizes understanding of need to obtain nutritional consultation Date Initiated: 02/06/2018 Target Resolution Date: 03/08/2018 Goal Status: Active Interventions: Provide education on  nutrition Notes: ` Orientation to the Wound Care Program Nursing Diagnoses: Knowledge deficit related to the wound healing center program Goals: Patient/caregiver will verbalize understanding of the [place] Program Date Initiated: 02/06/2018 Target Resolution Date: 03/08/2018 Goal Status: Active Interventions: Provide education on orientation to the wound center Notes: ` Pressure Nursing Diagnoses: Knowledge deficit related to management of pressures ulcers Goals: Patient/caregiver will verbalize understanding of pressure ulcer management Date Initiated: 02/06/2018 Target Resolution Date: 03/08/2018 Goal Status: Active Interventions: Assess offloading mechanisms upon admission and as needed Provide education on pressure ulcers Notes: ` Wound/Skin Impairment Nursing Diagnoses: Knowledge deficit related to ulceration/compromised skin integrity Goals: Ulcer/skin breakdown will have a volume reduction of 80% by week 12 Date Initiated: 02/06/2018 Target Resolution Date: 04/08/2018 Goal Status: Active [name], [name] ([id number]) Interventions: Assess ulceration(s) every visit Treatment Activities: Skin care regimen initiated : 02/06/2018 Topical wound management initiated : 02/06/2018 Notes: Electronic Signature(s) Signed: 05/29/2018 6:04:06 PM By: Gretta Cool, BSN, RN, CWS, Kim RN, BSN Entered By: Gretta Cool, BSN, RN, CWS, Kim on 05/29/2018 09:18:19 [name] ([id number]) --------------------------------------------------------------------------------  Pain Assessment Details Patient Name: [name], [name]. Date of Service: 05/29/2018 9:00 AM Medical Record Number: [id number] Patient Account Number: [id number] Date of Birth/Sex: 1931-08-02 (84 y.o. M) Treating RN: Secundino Ginger Primary Care Bradlee Bridgers: Burman Freestone Other Clinician: Referring Naasia Weilbacher: Burman Freestone Treating Saleemah Mollenhauer/Extender: Cathie Olden in Treatment: 16 Active Problems Location of Pain  Severity and Description of Pain Patient Has Paino No Site Locations Pain Management and Medication Current Pain Management: Goals for Pain Management pt denies any pain at this time. Electronic Signature(s) Signed: 05/29/2018 4:00:36 PM By: Secundino Ginger Entered By: Secundino Ginger on 05/29/2018 09:04:55 [name] ([id number]) -------------------------------------------------------------------------------- Patient/Caregiver Education Details Patient Name: [name], [name] Date of Service: 05/29/2018 9:00 AM Medical Record Number: [id number] Patient Account Number: [id number] Date of Birth/Gender: 1931-03-05 (84 y.o. M) Treating RN: Roger Shelter Primary Care Physician: Burman Freestone Other Clinician: Referring Physician: Burman Freestone Treating Physician/Extender: Cathie Olden in Treatment: 16 Education Assessment Education Provided To: Patient Education Topics Provided Wound Debridement: Handouts: Wound Debridement Methods: Explain/Verbal Responses: State content correctly Wound/Skin Impairment: Handouts: Caring for Your Ulcer Methods: Explain/Verbal Responses: State content correctly Electronic Signature(s) Signed: 05/30/2018 9:21:22 AM By: Roger Shelter Entered By: Roger Shelter on 05/29/2018 09:30:43 [name] ([id number]) -------------------------------------------------------------------------------- Wound Assessment Details Patient Name: [name] Date of Service: 05/29/2018 9:00 AM Medical Record Number: [id number] Patient Account Number: [id number] Date of Birth/Sex: 12/27/1930 (84 y.o. M) Treating RN: Secundino Ginger Primary Care Sylis Ketchum: Burman Freestone Other Clinician: Referring Deandre Stansel: Burman Freestone Treating Daundre Biel/Extender: Cathie Olden in Treatment: 16 Wound Status Wound Number: 1 Primary Pressure Ulcer Etiology: Wound Location: Sacrum Wound Open Wounding Event: Pressure Injury Status: Date Acquired:  01/17/2018 Comorbid Cataracts, Asthma, Angina, Arrhythmia, Weeks Of Treatment: 16 History: Coronary Artery Disease, Hypertension, Clustered Wound: No Myocardial Infarction, Osteoarthritis, Dementia, Neuropathy Photos Photo Uploaded By: Secundino Ginger on 05/29/2018 09:17:14 Wound Measurements Length: (cm) 1.2 % Reduction Width: (cm) 2.5 % Reduction Depth: (cm) 0.4 Epitheliali Area: (cm) 2.356 Tunneling: Volume: (cm) 0.942 Underminin in Area: 88.5% in Volume: 97.7% zation: None No g: No Wound Description Classification: Category/Stage III Foul Odor Wound Margin: Distinct, outline attached Slough/Fib Exudate Amount: Small Exudate Type: Purulent Exudate Color: yellow, brown, green After Cleansing: No rino Yes Wound Bed Granulation Amount: Small (1-33%) Exposed Structure Granulation Quality: Pink Fascia Exposed: No Necrotic Amount: Medium (34-66%) Fat Layer (Subcutaneous Tissue) Exposed: Yes Necrotic Quality: Adherent Slough Tendon Exposed: No Muscle Exposed: No Joint Exposed: No Bone Exposed: No [name], [name] ([id number]) Periwound Skin Texture Texture Color No Abnormalities Noted: No No Abnormalities Noted: No Callus: No Atrophie Blanche: No Crepitus: No Cyanosis: No Excoriation: Yes Ecchymosis: No Induration: No Erythema: Yes Rash: No Erythema Location: Circumferential Scarring: No Hemosiderin Staining: No Mottled: No Moisture Pallor: No No Abnormalities Noted: No Rubor: No Dry / Scaly: No Maceration: No Temperature / Pain Temperature: No Abnormality Tenderness on Palpation: Yes Wound Preparation Ulcer Cleansing: Rinsed/Irrigated with Saline Topical Anesthetic Applied: Other: lidocaine 4%, Treatment Notes Wound #1 (Sacrum) 1. Cleansed with: Clean wound with Normal Saline 2. Anesthetic Topical Lidocaine 4% cream to wound bed prior to debridement 3. Peri-wound Care: Other peri-wound care (specify in notes) 4. Dressing Applied: Prisma Ag 5.  Secondary Dressing Applied Bordered Foam Dressing Notes TCA Electronic Signature(s) Signed: 05/29/2018 4:00:36 PM By: Secundino Ginger Entered By: Secundino Ginger on 05/29/2018 09:13:07 [name] ([id number]) -------------------------------------------------------------------------------- Vitals Details Patient Name: [name] Date of Service: 05/29/2018 9:00 AM Medical Record Number: [id number] Patient Account Number: [id number] Date  of Birth/Sex: July 08, 1931 (84 y.o. M) Treating RN: Secundino Ginger Primary Care Kaliya Shreiner: Burman Freestone Other Clinician: Referring Lovis More: Burman Freestone Treating Yaeko Fazekas/Extender: Cathie Olden in Treatment: 16 Vital Signs Time Taken: 09:00 Temperature (F): 98.1 Height (in): 69 Pulse (bpm): 60 Weight (lbs): 155 Respiratory Rate (breaths/min): 18 Body Mass Index (BMI): 22.9 Blood Pressure (mmHg): 168/62 Reference Range: 80 - 120 mg / dl Electronic Signature(s) Signed: 05/29/2018 4:00:36 PM By: Secundino Ginger Entered BySecundino Ginger on 05/29/2018 09:05:25

## 2018-06-11 ENCOUNTER — Other Ambulatory Visit
Admission: RE | Admit: 2018-06-11 | Discharge: 2018-06-11 | Disposition: A | Discharge status: Home / Self Care | Payer: Medicare Other | Source: Ambulatory Visit | Attending: Physician Assistant | Admitting: Physician Assistant

## 2018-06-11 ENCOUNTER — Encounter: Payer: Medicare Other | Admitting: Physician Assistant

## 2018-06-11 DIAGNOSIS — B999 Unspecified infectious disease: Secondary | ICD-10-CM | POA: Insufficient documentation

## 2018-06-11 DIAGNOSIS — L89153 Pressure ulcer of sacral region, stage 3: Secondary | ICD-10-CM | POA: Diagnosis not present

## 2018-06-14 LAB — AEROBIC CULTURE W GRAM STAIN (SUPERFICIAL SPECIMEN)

## 2018-06-14 LAB — AEROBIC CULTURE  (SUPERFICIAL SPECIMEN)

## 2018-06-14 NOTE — Progress Notes (Signed)
CIAN, COSTANZO ([id number]) Visit Report for 05/29/2018 Chief Complaint Document Details Patient Name: [name], [name]. Date of Service: 05/29/2018 9:00 AM Medical Record Number: [id number] Patient Account Number: [id number] Date of Birth/Sex: [birth year] (84 y.o. M) Treating RN: Ahmed Prima Primary Care Provider: Burman Freestone Other Clinician: Referring Provider: Burman Freestone Treating Provider/Extender: Cathie Olden in Treatment: 16 Information Obtained from: Patient Chief Complaint Sacral pressure ulcer Electronic Signature(s) Signed: 05/29/2018 9:22:56 AM By: Lawanda Cousins Entered By: Lawanda Cousins on 05/29/2018 09:22:56 [name] ([id number]) -------------------------------------------------------------------------------- Debridement Details Patient Name: [name] Date of Service: 05/29/2018 9:00 AM Medical Record Number: [id number] Patient Account Number: [id number] Date of Birth/Sex: [birth year] (84 y.o. M) Treating RN: Cornell Barman Primary Care Provider: Burman Freestone Other Clinician: Referring Provider: Burman Freestone Treating Provider/Extender: Cathie Olden in Treatment: 16 Debridement Performed for Wound #1 Sacrum Assessment: Performed By: Physician Lawanda Cousins, NP Debridement Type: Debridement Pre-procedure Verification/Time Yes - 09:18 Out Taken: Start Time: 09:18 Pain Control: Other : lidocaine 4% Total Area Debrided (L x W): 1.2 (cm) x 2.5 (cm) = 3 (cm) Tissue and other material Viable, Subcutaneous, Fibrin/Exudate debrided: Level: Skin/Subcutaneous Tissue Debridement Description: Excisional Instrument: Curette Bleeding: Minimum Hemostasis Achieved: Pressure End Time: 09:21 Procedural Pain: 0 Post Procedural Pain: 0 Response to Treatment: Procedure was tolerated well Level of Consciousness: Awake and Alert Post Debridement Measurements of Total Wound Length: (cm) 1.2 Stage: Category/Stage III Width: (cm)  2.5 Depth: (cm) 0.2 Volume: (cm) 0.471 Character of Wound/Ulcer Post Stable Debridement: Post Procedure Diagnosis Same as Pre-procedure Electronic Signature(s) Signed: 05/29/2018 5:29:48 PM By: Lawanda Cousins Signed: 05/29/2018 6:04:06 PM By: Gretta Cool, BSN, RN, CWS, Kim RN, BSN Entered By: Gretta Cool, BSN, RN, CWS, Kim on 05/29/2018 09:19:59 [name] ([id number]) -------------------------------------------------------------------------------- HPI Details Patient Name: [name] Date of Service: 05/29/2018 9:00 AM Medical Record Number: [id number] Patient Account Number: [id number] Date of Birth/Sex: [birth year] (84 y.o. M) Treating RN: Ahmed Prima Primary Care Provider: Burman Freestone Other Clinician: Referring Provider: Burman Freestone Treating Provider/Extender: Cathie Olden in Treatment: 16 History of Present Illness HPI Description: 02/06/18 on evaluation today patient presents for initial evaluation and our clinic concerning an issue which began roughly 3 weeks ago when the patient fell in his home on the floor in his kitchen and laid him down this detergent for roughly 3 days. He had a pressure injury to the left shoulder. This unfortunately has caused him a lot of discomfort although it finally seems to be doing better if anything is really having a lot of itching right now. This appears potentially be a contact dermatitis issue. He also has a significant pressure injury to the sacrum at this time as well which is also showing fascia exposure right over the bone but no evidence of bone exposure at this point which is good news. They have been using Santyl as well as Saline soaked gauze at this point in time. There does appear to be a lot of necrotic slough in the base of the wound. He does have a history of incontinence, myocardial infarction, and hypertension. He also is "borderline diabetic" hemoglobin A1c of 6.0. Currently he has some discomfort in the  pressure site at the sacrum but fortunately nothing too significant this did require sharp debridement today. 02/13/18 on evaluation today patient appears to be doing much better in regard to his sacral wound. He has been tolerating the dressing changes without complication with the Vashe. Fortunately there is no evidence of infection and though  there is some Slough on the surface of the wound bed he has excellent granulation noted. Overall I'm pleased with how things have progressed in that regard. A glance at his shoulder as well and the rash seems to be someone improving in my pinion at this site as well. Overall I am pleased with what we're seeing and so is the family. 02/20/18 on evaluation today patient appears to be doing a little worse in regard to the sacral wound only in the fact that there is redness surrounding it has me somewhat concerned for infection. The drainage has also apparently been a little bit off color compared to normal according to family they did keep the dressing today that was removed to show me and I agree this seems to be a little bit different compared to what we have been seeing. Coupled with the redness I'm concerned he may be developing some cellulitis surrounding the wound bed. 02/27/18 on evaluation today patient presents for follow-up concerning his sacral ulcer. We have received the results back from his wound culture which shows unfortunately that the doxycycline will not be of benefit for him I am going to need to initiate treatment with something else in order to treat the pseudomonas. Otherwise he does not seem to be having any significant pain although his daughter states there are sometimes when he states having pain. We continue to use the Vashe currently. 03/06/18 on evaluation today patient's sacral wound appears to be doing better in my opinion. He has been tolerating the dressing changes without complication. With that being said the silver nitrate has  helped with the prominent area of hyper granulation at the 6 o'clock location we will likely need to repeat this again today. Nonetheless overall I am pleased with how things have improved over the last week. The erythema surrounding the wound seems to be greatly improved. 03/13/18 on evaluation today patient's wound actually does not appear to be terribly infected although she does continue to have erythema surrounding the wound bed especially on the left border. I'm still somewhat concerned about the fact that the oral antibiotics alone may not be completely treating his infection. I previously discussed may need to go for IV antibiotic therapy I'm concerned that may be the case. We will need to make a referral today for infectious disease. 03/20/18 on evaluation today the patient sacral wound actually appears to be doing fairly well in regard to granulation although he continues unfortunately to have it your theme is surrounding the periwound region. There's also some increased swelling at the 6 o'clock location which also has me somewhat concerned. With that being said he does have some discomfort but nothing too significant at this point. He still has not heard from infectious disease his daughter and wife are both present during the office visit today they're going to check back with this again. We did get the information for them to call them today. 03/27/18 on evaluation today patient is seen concerning his ongoing sacral ulcer. He has been tolerating the dressing changes without complication. With that being said he does present with evidence of bright green drainage noted on the dressing which again is something that I do often expect to see with a pseudomonas infection. He continues to have your theme is RHYTHM, WIGFALL ([id number]) surrounding the wound bed as well and again I'm not 100% convinced this is just pressure related. I did speak with Colletta Maryland who is the nurse practitioner in  [place] with infectious disease.  I spoke with her actually yesterday concerning this patient. She is not 100% convinced that this is infected. She question whether the wound may just be colonized with Pseudomonas and not actually causing an active infection. I am really not thinking that the edema is associated with pressure alone and again overall I don't feel that your theme and is consistent with a pressure injury either as he's never had any contusions noted like a deep tissue injury on his heel which was new and I did visualize today this was on the left heel. Nonetheless she wanted to give this a little bit more time and thought it would be appropriate to start the Wound VAC at this point. 04/03/18 on evaluation today patient sacral ulcer actually appears to be doing fairly well at this point. He has been tolerating the dressing changes without complication. With that being said I'm very pleased with the progress that has been made in regard to his sacral wound over the past week I do not see as much in the way of erythema which is great news. Nonetheless he does have a small area of hyper granulation unfortunately. This is at roughly the 7 o'clock location and I think does need to be addressed so that this will heal more appropriately. Nonetheless I think we may be ready to go ahead and initiate therapy with the Wound VAC. 04/10/18 on evaluation today patient appears to be doing excellent in regard to his sacral ulcer. The show signs of great improvement in overall I'm very pleased with how things look. He has been tolerating the dressing changes without complication. Specifically this is the Wound VAC. He also seems to be doing well with the antibiotic there is decreased your theme and redness surrounding the sacral area at this point in the wound has filled in quite significantly. 04/17/18 on evaluation today patient actually appears to be doing excellent in regard to his sacral ulcer. He's  been tolerating the dressing changes without complication specifically the Wound VAC. There really are no major concerns from the patient nor family this point he is having no pain. He does have a little bit of Epiboly on the lateral portions of the wound where he does have a little bit more depth that will need to be addressed today. 04/23/18 on evaluation today patient's wound actually appears to be doing excellent at this point. He has been tolerating the Wound VAC and this appears to be doing well other than the fact that it seems to be breaking seal at the 6 o'clock location. I do believe that adding a duodenum dressing at this location try and help maintain the seal would be appropriate and likely very effective. With that being said he overall seems to be showing signs of good improvement at this point. There does not appear to be any evidence of significant infection which is also excellent news. 04/30/18 on evaluation today patient actually appears to be doing well in regard to his sacral ulcer. He's been tolerating the dressing changes without complication. Fortunately there does not appear to be any evidence of infection. Overall I'm very pleased with the progress that has been made up to this point. He does have some blistering underneath the draping unfortunately although this is definitely something that has been noted on other patients previously is a fairly common occurrence. Nonetheless the patient seems to be doing fairly well in general in my pinion based on what I see at this time. I do believe these are  fairly superficial and minor. 05/07/18 on evaluation today patient's wound actually appears to be doing excellent at this point in time. He has been tolerating the Wound VAC decently well he states that it is somewhat cumbersome to carry around unfortunately. The only other issue he's been having according to family is that they been having a difficult time keeping the Wound VAC in  place and doing what is supposed to do without making. Obviously I do think that this is definitely of concern. Nonetheless I do believe she's made good progress up to this point. I'm very happy in that regard. 05/14/18 on evaluation today patient's wound continues to make good progress at this point. He had a minimal amount of slough noted on the surface which was easily wiped away with saline and gauze and in general I feel like that he is continuing to show excellent progress even with the discontinuation of the Wound VAC. Overall I'm pleased in this regard. He was having issues with the Wound VAC in getting it to seal I think that using the Prisma at this time has been equally efficient and getting the wound to diminish in size. 05/29/18- He is here in follow up evaluation for a sacral ulcer. There is improvement, we will continue with prisma and he will follow up in two weeks Electronic Signature(s) Signed: 05/29/2018 9:23:38 AM By: Lawanda Cousins Entered By: Lawanda Cousins on 05/29/2018 09:23:37 [name], [name] ([id number]) [name], [name] ([id number]) -------------------------------------------------------------------------------- Physician Orders Details Patient Name: [name] Date of Service: 05/29/2018 9:00 AM Medical Record Number: [id number] Patient Account Number: [id number] Date of Birth/Sex: 1931-02-21 (84 y.o. M) Treating RN: Cornell Barman Primary Care Provider: Burman Freestone Other Clinician: Referring Provider: Burman Freestone Treating Provider/Extender: Cathie Olden in Treatment: 67 Verbal / Phone Orders: No Diagnosis Coding Wound Cleansing Wound #1 Sacrum o Clean wound with Normal Saline. Anesthetic (add to Medication List) Wound #1 Sacrum o Topical Lidocaine 4% cream applied to wound bed prior to debridement (In Clinic Only). Skin Barriers/Peri-Wound Care Wound #1 Sacrum o Skin Prep o Triamcinolone Acetonide Ointment (TCA) - cream in clinic  on red areas around wound where tape has irritated skin Primary Wound Dressing Wound #1 Sacrum o Silver Collagen Secondary Dressing Wound #1 Sacrum o Boardered Foam Dressing Dressing Change Frequency Wound #1 Sacrum o Change Dressing Monday, Wednesday, Friday - HHRN to provide wound care on Wednesday and Friday. Patient to come to clinic on Monday. Follow-up Appointments Wound #1 Sacrum o Return Appointment in 2 weeks. Off-Loading Wound #1 Sacrum o Mattress - hospital bed o Turn and reposition every 2 hours o Other: - please make sure the pt is floating his heels while lying in bed Additional Orders / Instructions Wound #1 Sacrum o Increase protein intake. Home Health [name], [name] ([id number]) Wound #1 [place] Visits - [facility] to provide wound care on Wednesday and Friday patient comes to clinic on Mondays o Home Health Nurse may visit PRN to address patientos wound care needs. o FACE TO FACE ENCOUNTER: MEDICARE and MEDICAID PATIENTS: I certify that this patient is under my care and that I had a face-to-face encounter that meets the physician face-to-face encounter requirements with this patient on this date. The encounter with the patient was in whole or in part for the following MEDICAL CONDITION: (primary reason for [place]) MEDICAL NECESSITY: I certify, that based on my findings, NURSING services are a medically necessary home health service. HOME BOUND STATUS: I  certify that my clinical findings support that this patient is homebound (i.e., Due to illness or injury, pt requires aid of supportive devices such as crutches, cane, wheelchairs, walkers, the use of special transportation or the assistance of another person to leave their place of residence. There is a normal inability to leave the home and doing so requires considerable and taxing effort. Other absences are for medical reasons / religious services and  are infrequent or of short duration when for other reasons). o If current dressing causes regression in wound condition, may D/C ordered dressing product/s and apply Normal Saline Moist Dressing daily until next [place] / Other MD appointment. [place] of regression in wound condition at [phone]. o Please direct any NON-WOUND related issues/requests for orders to patient's Primary Care Physician Electronic Signature(s) Signed: 05/29/2018 5:29:48 PM By: Lawanda Cousins Signed: 05/29/2018 6:04:06 PM By: Gretta Cool, BSN, RN, CWS, Kim RN, BSN Entered By: Gretta Cool, BSN, RN, CWS, Kim on 05/29/2018 09:24:02 [name] ([id number]) -------------------------------------------------------------------------------- Problem List Details Patient Name: [name], [name] Date of Service: 05/29/2018 9:00 AM Medical Record Number: [id number] Patient Account Number: [id number] Date of Birth/Sex: 03-06-1931 (84 y.o. M) Treating RN: Ahmed Prima Primary Care Provider: Burman Freestone Other Clinician: Referring Provider: Burman Freestone Treating Provider/Extender: Cathie Olden in Treatment: 16 Active Problems ICD-10 Evaluated Encounter Code Description Active Date Today Diagnosis L89.154 Pressure ulcer of sacral region, stage 4 02/06/2018 No Yes L24.0 Irritant contact dermatitis due to detergents 02/06/2018 No Yes Inactive Problems Resolved Problems Electronic Signature(s) Signed: 05/29/2018 9:22:18 AM By: Lawanda Cousins Entered By: Lawanda Cousins on 05/29/2018 09:22:17 [name] ([id number]) -------------------------------------------------------------------------------- Progress Note Details Patient Name: [name] Date of Service: 05/29/2018 9:00 AM Medical Record Number: [id number] Patient Account Number: [id number] Date of Birth/Sex: [birth year] (84 y.o. M) Treating RN: Ahmed Prima Primary Care Provider: Burman Freestone Other  Clinician: Referring Provider: Burman Freestone Treating Provider/Extender: Cathie Olden in Treatment: 16 Subjective Chief Complaint Information obtained from Patient Sacral pressure ulcer History of Present Illness (HPI) 02/06/18 on evaluation today patient presents for initial evaluation and our clinic concerning an issue which began roughly 3 weeks ago when the patient fell in his home on the floor in his kitchen and laid him down this detergent for roughly 3 days. He had a pressure injury to the left shoulder. This unfortunately has caused him a lot of discomfort although it finally seems to be doing better if anything is really having a lot of itching right now. This appears potentially be a contact dermatitis issue. He also has a significant pressure injury to the sacrum at this time as well which is also showing fascia exposure right over the bone but no evidence of bone exposure at this point which is good news. They have been using Santyl as well as Saline soaked gauze at this point in time. There does appear to be a lot of necrotic slough in the base of the wound. He does have a history of incontinence, myocardial infarction, and hypertension. He also is "borderline diabetic" hemoglobin A1c of 6.0. Currently he has some discomfort in the pressure site at the sacrum but fortunately nothing too significant this did require sharp debridement today. 02/13/18 on evaluation today patient appears to be doing much better in regard to his sacral wound. He has been tolerating the dressing changes without complication with the Vashe. Fortunately there is no evidence of infection and though there is some Slough on the surface of  the wound bed he has excellent granulation noted. Overall I'm pleased with how things have progressed in that regard. A glance at his shoulder as well and the rash seems to be someone improving in my pinion at this site as well. Overall I am pleased with what we're  seeing and so is the family. 02/20/18 on evaluation today patient appears to be doing a little worse in regard to the sacral wound only in the fact that there is redness surrounding it has me somewhat concerned for infection. The drainage has also apparently been a little bit off color compared to normal according to family they did keep the dressing today that was removed to show me and I agree this seems to be a little bit different compared to what we have been seeing. Coupled with the redness I'm concerned he may be developing some cellulitis surrounding the wound bed. 02/27/18 on evaluation today patient presents for follow-up concerning his sacral ulcer. We have received the results back from his wound culture which shows unfortunately that the doxycycline will not be of benefit for him I am going to need to initiate treatment with something else in order to treat the pseudomonas. Otherwise he does not seem to be having any significant pain although his daughter states there are sometimes when he states having pain. We continue to use the Vashe currently. 03/06/18 on evaluation today patient's sacral wound appears to be doing better in my opinion. He has been tolerating the dressing changes without complication. With that being said the silver nitrate has helped with the prominent area of hyper granulation at the 6 o'clock location we will likely need to repeat this again today. Nonetheless overall I am pleased with how things have improved over the last week. The erythema surrounding the wound seems to be greatly improved. 03/13/18 on evaluation today patient's wound actually does not appear to be terribly infected although she does continue to have erythema surrounding the wound bed especially on the left border. I'm still somewhat concerned about the fact that the oral antibiotics alone may not be completely treating his infection. I previously discussed may need to go for IV antibiotic therapy  I'm concerned that may be the case. We will need to make a referral today for infectious disease. 03/20/18 on evaluation today the patient sacral wound actually appears to be doing fairly well in regard to granulation although he continues unfortunately to have it your theme is surrounding the periwound region. There's also some increased swelling at the 6 o'clock location which also has me somewhat concerned. With that being said he does have some discomfort but [name], [name]. ([id number]) nothing too significant at this point. He still has not heard from infectious disease his daughter and wife are both present during the office visit today they're going to check back with this again. We did get the information for them to call them today. 03/27/18 on evaluation today patient is seen concerning his ongoing sacral ulcer. He has been tolerating the dressing changes without complication. With that being said he does present with evidence of bright green drainage noted on the dressing which again is something that I do often expect to see with a pseudomonas infection. He continues to have your theme is surrounding the wound bed as well and again I'm not 100% convinced this is just pressure related. I did speak with Colletta Maryland who is the nurse practitioner in [place] with infectious disease. I spoke with her actually yesterday concerning this  patient. She is not 100% convinced that this is infected. She question whether the wound may just be colonized with Pseudomonas and not actually causing an active infection. I am really not thinking that the edema is associated with pressure alone and again overall I don't feel that your theme and is consistent with a pressure injury either as he's never had any contusions noted like a deep tissue injury on his heel which was new and I did visualize today this was on the left heel. Nonetheless she wanted to give this a little bit more time and thought it would be  appropriate to start the Wound VAC at this point. 04/03/18 on evaluation today patient sacral ulcer actually appears to be doing fairly well at this point. He has been tolerating the dressing changes without complication. With that being said I'm very pleased with the progress that has been made in regard to his sacral wound over the past week I do not see as much in the way of erythema which is great news. Nonetheless he does have a small area of hyper granulation unfortunately. This is at roughly the 7 o'clock location and I think does need to be addressed so that this will heal more appropriately. Nonetheless I think we may be ready to go ahead and initiate therapy with the Wound VAC. 04/10/18 on evaluation today patient appears to be doing excellent in regard to his sacral ulcer. The show signs of great improvement in overall I'm very pleased with how things look. He has been tolerating the dressing changes without complication. Specifically this is the Wound VAC. He also seems to be doing well with the antibiotic there is decreased your theme and redness surrounding the sacral area at this point in the wound has filled in quite significantly. 04/17/18 on evaluation today patient actually appears to be doing excellent in regard to his sacral ulcer. He's been tolerating the dressing changes without complication specifically the Wound VAC. There really are no major concerns from the patient nor family this point he is having no pain. He does have a little bit of Epiboly on the lateral portions of the wound where he does have a little bit more depth that will need to be addressed today. 04/23/18 on evaluation today patient's wound actually appears to be doing excellent at this point. He has been tolerating the Wound VAC and this appears to be doing well other than the fact that it seems to be breaking seal at the 6 o'clock location. I do believe that adding a duodenum dressing at this location try and  help maintain the seal would be appropriate and likely very effective. With that being said he overall seems to be showing signs of good improvement at this point. There does not appear to be any evidence of significant infection which is also excellent news. 04/30/18 on evaluation today patient actually appears to be doing well in regard to his sacral ulcer. He's been tolerating the dressing changes without complication. Fortunately there does not appear to be any evidence of infection. Overall I'm very pleased with the progress that has been made up to this point. He does have some blistering underneath the draping unfortunately although this is definitely something that has been noted on other patients previously is a fairly common occurrence. Nonetheless the patient seems to be doing fairly well in general in my pinion based on what I see at this time. I do believe these are fairly superficial and minor. 05/07/18 on evaluation  today patient's wound actually appears to be doing excellent at this point in time. He has been tolerating the Wound VAC decently well he states that it is somewhat cumbersome to carry around unfortunately. The only other issue he's been having according to family is that they been having a difficult time keeping the Wound VAC in place and doing what is supposed to do without making. Obviously I do think that this is definitely of concern. Nonetheless I do believe she's made good progress up to this point. I'm very happy in that regard. 05/14/18 on evaluation today patient's wound continues to make good progress at this point. He had a minimal amount of slough noted on the surface which was easily wiped away with saline and gauze and in general I feel like that he is continuing to show excellent progress even with the discontinuation of the Wound VAC. Overall I'm pleased in this regard. He was having issues with the Wound VAC in getting it to seal I think that using the  Prisma at this time has been equally efficient and getting the wound to diminish in size. 05/29/18- He is here in follow up evaluation for a sacral ulcer. There is improvement, we will continue with prisma and he will follow up in two weeks [name], [name] ([id number]) Patient History Information obtained from Patient. Family History Cancer - Paternal Grandparents, Diabetes - Father, Heart Disease - Mother,Father, Stroke - Father, No family history of Hypertension, Kidney Disease, Lung Disease, Seizures, Thyroid Problems, Tuberculosis. Social History Never smoker, Marital Status - Widowed, Alcohol Use - Never, Drug Use - No History, Caffeine Use - Daily. Medical History Hospitalization/Surgery History - 01/20/2018, [facility], [facility]. Medical And Surgical History Notes Endocrine Borderline Oncologic Melanoma on back Review of Systems (ROS) Constitutional Symptoms (General Health) Denies complaints or symptoms of Fatigue, Fever, Chills. Respiratory Denies complaints or symptoms of Chronic or frequent coughs, Shortness of Breath. Cardiovascular Denies complaints or symptoms of Chest pain, LE edema. Gastrointestinal Denies complaints or symptoms of Nausea, Vomiting. Objective Constitutional Vitals Time Taken: 9:00 AM, Height: 69 in, Weight: 155 lbs, BMI: 22.9, Temperature: 98.1 F, Pulse: 60 bpm, Respiratory Rate: 18 breaths/min, Blood Pressure: 168/62 mmHg. Integumentary (Hair, Skin) Wound #1 status is Open. Original cause of wound was Pressure Injury. The wound is located on the Sacrum. The wound measures 1.2cm length x 2.5cm width x 0.4cm depth; 2.356cm^2 area and 0.942cm^3 volume. There is Fat Layer (Subcutaneous Tissue) Exposed exposed. There is no tunneling or undermining noted. There is a small amount of purulent drainage noted. The wound margin is distinct with the outline attached to the wound base. There is small (1-33%) pink granulation within the wound bed. There is a medium  (34-66%) amount of necrotic tissue within the wound bed including Adherent Slough. The periwound skin appearance exhibited: Excoriation, Erythema. The periwound skin appearance did not exhibit: Callus, Crepitus, Induration, Rash, Scarring, Dry/Scaly, Maceration, Atrophie Blanche, Cyanosis, Ecchymosis, Hemosiderin Staining, Mottled, Pallor, Rubor. The surrounding wound skin color is noted with erythema which is circumferential. Periwound temperature was noted as No Abnormality. The periwound has tenderness on palpation. [name], [name] ([id number]) Assessment Active Problems ICD-10 Pressure ulcer of sacral region, stage 4 Irritant contact dermatitis due to detergents Procedures Wound #1 Pre-procedure diagnosis of Wound #1 is a Pressure Ulcer located on the Sacrum . There was a Excisional Skin/Subcutaneous Tissue Debridement with a total area of 3 sq cm performed by Lawanda Cousins, NP. With the following instrument(s): Curette to remove Viable tissue/material. Material  removed includes Subcutaneous Tissue and Fibrin/Exudate and after achieving pain control using Other (lidocaine 4%). No specimens were taken. A time out was conducted at 09:18, prior to the start of the procedure. A Minimum amount of bleeding was controlled with Pressure. The procedure was tolerated well with a pain level of 0 throughout and a pain level of 0 following the procedure. Patient s Level of Consciousness post procedure was recorded as Awake and Alert. Post Debridement Measurements: 1.2cm length x 2.5cm width x 0.2cm depth; 0.471cm^3 volume. Post debridement Stage noted as Category/Stage III. Character of Wound/Ulcer Post Debridement is stable. Post procedure Diagnosis Wound #1: Same as Pre-Procedure Plan Wound Cleansing: Wound #1 Sacrum: Clean wound with Normal Saline. Anesthetic (add to Medication List): Wound #1 Sacrum: Topical Lidocaine 4% cream applied to wound bed prior to debridement (In Clinic Only). Skin  Barriers/Peri-Wound Care: Wound #1 Sacrum: Skin Prep Triamcinolone Acetonide Ointment (TCA) - cream in clinic on red areas around wound where tape has irritated skin Primary Wound Dressing: Wound #1 Sacrum: Silver Collagen Secondary Dressing: Wound #1 Sacrum: Boardered Foam Dressing Dressing Change Frequency: Wound #1 Sacrum: Change Dressing Monday, Wednesday, Friday - HHRN to provide wound care on Wednesday and Friday. Patient to come to clinic on Monday. Follow-up Appointments: Wound #1 SacrumPEARSE, [name] ([id number]) Return Appointment in 2 weeks. Off-Loading: Wound #1 Sacrum: Mattress - hospital bed Turn and reposition every 2 hours Other: - please make sure the pt is floating his heels while lying in bed Additional Orders / Instructions: Wound #1 Sacrum: Increase protein intake. Home Health: Wound #1 Sacrum: Continue Home Health Visits - Jackqul[facility] to provide wound care on Wednesday and Friday patient comes to clinic on Mondays Home Health Nurse may visit PRN to address patient s wound care needs. FACE TO FACE ENCOUNTER: MEDICARE and MEDICAID PATIENTS: I certify that this patient is under my care and that I had a face-to-face encounter that meets the physician face-to-face encounter requirements with this patient on this date. The encounter with the patient was in whole or in part for the following MEDICAL CONDITION: (primary reason for [place]) MEDICAL NECESSITY: I certify, that based on my findings, NURSING services are a medically necessary home health service. HOME BOUND STATUS: I certify that my clinical findings support that this patient is homebound (i.e., Due to illness or injury, pt requires aid of supportive devices such as crutches, cane, wheelchairs, walkers, the use of special transportation or the assistance of another person to leave their place of residence. There is a normal inability to leave the home and doing so requires considerable  and taxing effort. Other absences are for medical reasons / religious services and are infrequent or of short duration when for other reasons). If current dressing causes regression in wound condition, may D/C ordered dressing product/s and apply Normal Saline Moist Dressing daily until next [place] / Other MD appointment. [place] of regression in wound condition at [phone]. Please direct any NON-WOUND related issues/requests for orders to patient's Primary Care Physician Electronic Signature(s) Signed: 05/29/2018 9:25:18 AM By: Lawanda Cousins Entered By: Lawanda Cousins on 05/29/2018 09:25:17 [name] ([id number]) -------------------------------------------------------------------------------- ROS/PFSH Details Patient Name: [name] Date of Service: 05/29/2018 9:00 AM Medical Record Number: [id number] Patient Account Number: [id number] Date of Birth/Sex: [birth year] (84 y.o. M) Treating RN: Ahmed Prima Primary Care Provider: Burman Freestone Other Clinician: Referring Provider: Burman Freestone Treating Provider/Extender: Cathie Olden in Treatment: 16 Information Obtained From Patient  Wound History Do you currently have one or more open woundso Yes How many open wounds do you currently haveo 2 Approximately how long have you had your woundso 3 weeks Has your wound(s) ever healed and then re-openedo Yes Have you had any lab work done in the past montho Yes Have you tested positive for osteomyelitis (bone infection)o No Have you had any tests for circulation on your legso No Constitutional Symptoms (General Health) Complaints and Symptoms: Negative for: Fatigue; Fever; Chills Respiratory Complaints and Symptoms: Negative for: Chronic or frequent coughs; Shortness of Breath Medical History: Positive for: Asthma Negative for: Aspiration; Chronic Obstructive Pulmonary Disease (COPD); Pneumothorax; Sleep Apnea;  Tuberculosis Cardiovascular Complaints and Symptoms: Negative for: Chest pain; LE edema Medical History: Positive for: Angina; Arrhythmia; Coronary Artery Disease; Hypertension; Myocardial Infarction - 2001 Negative for: Congestive Heart Failure; Deep Vein Thrombosis; Hypotension; Peripheral Arterial Disease; Peripheral Venous Disease; Phlebitis; Vasculitis Gastrointestinal Complaints and Symptoms: Negative for: Nausea; Vomiting Medical History: Negative for: Cirrhosis ; Colitis; Crohnos; Hepatitis A; Hepatitis B; Hepatitis C Eyes Medical History: Positive for: Cataracts - bilateral removal Negative for: Glaucoma; Optic Neuritis [name], [name] ([id number]) Ear/Nose/Mouth/Throat Medical History: Negative for: Chronic sinus problems/congestion; Middle ear problems Hematologic/Lymphatic Medical History: Negative for: Anemia; Hemophilia; Human Immunodeficiency Virus; Lymphedema; Sickle Cell Disease Endocrine Medical History: Negative for: Type I Diabetes; Type II Diabetes Past Medical History Notes: Borderline Genitourinary Medical History: Negative for: End Stage Renal Disease Immunological Medical History: Negative for: Lupus Erythematosus; Raynaudos; Scleroderma Integumentary (Skin) Medical History: Negative for: History of Burn; History of pressure wounds Musculoskeletal Medical History: Positive for: Osteoarthritis Negative for: Gout; Rheumatoid Arthritis; Osteomyelitis Neurologic Medical History: Positive for: Dementia; Neuropathy Negative for: Quadriplegia; Paraplegia; Seizure Disorder Oncologic Medical History: Negative for: Received Chemotherapy; Received Radiation Past Medical History Notes: Melanoma on back Psychiatric Medical History: Negative for: Anorexia/bulimia; Confinement Anxiety HBO Extended History Items Eyes: Cataracts JONES, VIVIANI ([id number]) Immunizations Pneumococcal Vaccine: Received Pneumococcal Vaccination: Yes Implantable  Devices Hospitalization / Surgery History Name of Hospital Purpose of Hospitalization/Surgery Date [facility] 01/20/2018 Family and Social History Cancer: Yes - Paternal Grandparents; Diabetes: Yes - Father; Heart Disease: Yes - Mother,Father; Hypertension: No; Kidney Disease: No; Lung Disease: No; Seizures: No; Stroke: Yes - Father; Thyroid Problems: No; Tuberculosis: No; Never smoker; Marital Status - Widowed; Alcohol Use: Never; Drug Use: No History; Caffeine Use: Daily; Financial Concerns: No; Food, Clothing or Shelter Needs: No; Support System Lacking: No; Transportation Concerns: No; Advanced Directives: Yes (Not Provided); Patient does not want information on Advanced Directives; Do not resuscitate: Yes (Not Provided); Living Will: Yes (Not Provided); Medical Power of Attorney: Yes - Rivaldo Hineman (Not Provided) Physician Affirmation I have reviewed and agree with the above information. Electronic Signature(s) Signed: 05/29/2018 5:29:48 PM By: Lawanda Cousins Signed: 06/04/2018 5:34:19 PM By: Alric Quan Entered By: Lawanda Cousins on 05/29/2018 09:24:59 [name] ([id number]) -------------------------------------------------------------------------------- SuperBill Details Patient Name: [name] Date of Service: 05/29/2018 Medical Record Number: [id number] Patient Account Number: [id number] Date of Birth/Sex: March 26, 1931 (84 y.o. M) Treating RN: Ahmed Prima Primary Care Provider: Burman Freestone Other Clinician: Referring Provider: Burman Freestone Treating Provider/Extender: Cathie Olden in Treatment: 16 Diagnosis Coding ICD-10 Codes Code Description L89.154 Pressure ulcer of sacral region, stage 4 L24.0 Irritant contact dermatitis due to detergents Facility Procedures CPT4 Code: [id number] Description: 11941 - DEB SUBQ TISSUE 20 SQ CM/< ICD-10 Diagnosis Description L89.154 Pressure ulcer of sacral region, stage 4 Modifier: Quantity: 1 Physician  Procedures CPT4 Code: [id number] Description: 81856 -  WC PHYS SUBQ TISS 20 SQ CM ICD-10 Diagnosis Description L89.154 Pressure ulcer of sacral region, stage 4 Modifier: Quantity: 1 Electronic Signature(s) Signed: 05/29/2018 9:25:27 AM By: Lawanda Cousins Entered By: Lawanda Cousins on 05/29/2018 09:25:27

## 2018-06-26 ENCOUNTER — Other Ambulatory Visit: Payer: Self-pay | Admitting: Physician Assistant

## 2018-06-26 ENCOUNTER — Encounter: Payer: Medicare Other | Attending: Physician Assistant | Admitting: Physician Assistant

## 2018-06-26 DIAGNOSIS — Z882 Allergy status to sulfonamides status: Secondary | ICD-10-CM | POA: Diagnosis not present

## 2018-06-26 DIAGNOSIS — F0390 Unspecified dementia without behavioral disturbance: Secondary | ICD-10-CM | POA: Diagnosis not present

## 2018-06-26 DIAGNOSIS — I252 Old myocardial infarction: Secondary | ICD-10-CM | POA: Diagnosis not present

## 2018-06-26 DIAGNOSIS — J45909 Unspecified asthma, uncomplicated: Secondary | ICD-10-CM | POA: Insufficient documentation

## 2018-06-26 DIAGNOSIS — Z886 Allergy status to analgesic agent status: Secondary | ICD-10-CM | POA: Insufficient documentation

## 2018-06-26 DIAGNOSIS — L89154 Pressure ulcer of sacral region, stage 4: Secondary | ICD-10-CM | POA: Insufficient documentation

## 2018-06-26 DIAGNOSIS — I10 Essential (primary) hypertension: Secondary | ICD-10-CM | POA: Diagnosis not present

## 2018-06-26 DIAGNOSIS — L240 Irritant contact dermatitis due to detergents: Secondary | ICD-10-CM | POA: Diagnosis not present

## 2018-06-26 DIAGNOSIS — I2510 Atherosclerotic heart disease of native coronary artery without angina pectoris: Secondary | ICD-10-CM | POA: Insufficient documentation

## 2018-06-26 DIAGNOSIS — Z85820 Personal history of malignant melanoma of skin: Secondary | ICD-10-CM | POA: Insufficient documentation

## 2018-06-26 DIAGNOSIS — Z888 Allergy status to other drugs, medicaments and biological substances status: Secondary | ICD-10-CM | POA: Insufficient documentation

## 2018-06-26 DIAGNOSIS — Z885 Allergy status to narcotic agent status: Secondary | ICD-10-CM | POA: Insufficient documentation

## 2018-06-27 NOTE — Progress Notes (Signed)
[name], [name] ([id number]) Visit Report for 06/26/2018 Arrival Information Details Patient Name: [name], [name]. Date of Service: 06/26/2018 10:00 AM Medical Record Number: [id number] Patient Account Number: [id number] Date of Birth/Sex: 27-Dec-1930 (84 y.o. M) Treating RN: Secundino Ginger Primary Care Eban Weick: Burman Freestone Other Clinician: Referring Giovani Neumeister: Burman Freestone Treating Klair Leising/Extender: [name], HOYT Weeks in Treatment: 20 Visit Information History Since Last Visit Added or deleted any medications: No Patient Arrived: Cane Any new allergies or adverse reactions: No Arrival Time: 10:01 Had a fall or experienced change in No Accompanied By: dtr activities of daily living that may affect Transfer Assistance: None risk of falls: Patient Identification Verified: Yes Signs or symptoms of abuse/neglect since last visito No Secondary Verification Process Completed: Yes Hospitalized since last visit: No Patient Requires Transmission-Based Precautions: No Implantable device outside of the clinic excluding No Patient Has Alerts: Yes cellular tissue based products placed in the center since last visit: Has Dressing in Place as Prescribed: Yes Pain Present Now: No Electronic Signature(s) Signed: 06/26/2018 3:05:43 PM By: Secundino Ginger Entered By: Secundino Ginger on 06/26/2018 10:02:28 [name] ([id number]) -------------------------------------------------------------------------------- Clinic Level of Care Assessment Details Patient Name: [name] Date of Service: 06/26/2018 10:00 AM Medical Record Number: [id number] Patient Account Number: [id number] Date of Birth/Sex: 03-25-1931 (84 y.o. M) Treating RN: Montey Hora Primary Care Manly Nestle: Burman Freestone Other Clinician: Referring Mallie Giambra: Burman Freestone Treating Gertie Broerman/Extender: [name], HOYT Weeks in Treatment: 20 Clinic Level of Care Assessment Items TOOL 4 Quantity Score []  - Use when only an  EandM is performed on FOLLOW-UP visit 0 ASSESSMENTS - Nursing Assessment / Reassessment X - Reassessment of Co-morbidities (includes updates in patient status) 1 10 X- 1 5 Reassessment of Adherence to Treatment Plan ASSESSMENTS - Wound and Skin Assessment / Reassessment X - Simple Wound Assessment / Reassessment - one wound 1 5 []  - 0 Complex Wound Assessment / Reassessment - multiple wounds []  - 0 Dermatologic / Skin Assessment (not related to wound area) ASSESSMENTS - Focused Assessment []  - Circumferential Edema Measurements - multi extremities 0 []  - 0 Nutritional Assessment / Counseling / Intervention []  - 0 Lower Extremity Assessment (monofilament, tuning fork, pulses) []  - 0 Peripheral Arterial Disease Assessment (using hand held doppler) ASSESSMENTS - Ostomy and/or Continence Assessment and Care []  - Incontinence Assessment and Management 0 []  - 0 Ostomy Care Assessment and Management (repouching, etc.) PROCESS - Coordination of Care X - Simple Patient / Family Education for ongoing care 1 15 []  - 0 Complex (extensive) Patient / Family Education for ongoing care []  - 0 Staff obtains Programmer, systems, Records, Test Results / Process Orders []  - 0 Staff telephones HHA, Nursing Homes / Clarify orders / etc []  - 0 Routine Transfer to another Facility (non-emergent condition) []  - 0 Routine Hospital Admission (non-emergent condition) []  - 0 New Admissions / Biomedical engineer / Ordering NPWT, Apligraf, etc. []  - 0 Emergency Hospital Admission (emergent condition) X- 1 10 Simple Discharge Coordination [name], [name] ([id number]) []  - 0 Complex (extensive) Discharge Coordination PROCESS - Special Needs []  - Pediatric / Minor Patient Management 0 []  - 0 Isolation Patient Management []  - 0 Hearing / Language / Visual special needs []  - 0 Assessment of Community assistance (transportation, D/C planning, etc.) []  - 0 Additional assistance / Altered mentation []  -  0 Support Surface(s) Assessment (bed, cushion, seat, etc.) INTERVENTIONS - Wound Cleansing / Measurement X - Simple Wound Cleansing - one wound 1 5 []  - 0 Complex Wound  Cleansing - multiple wounds X- 1 5 Wound Imaging (photographs - any number of wounds) []  - 0 Wound Tracing (instead of photographs) X- 1 5 Simple Wound Measurement - one wound []  - 0 Complex Wound Measurement - multiple wounds INTERVENTIONS - Wound Dressings X - Small Wound Dressing one or multiple wounds 1 10 []  - 0 Medium Wound Dressing one or multiple wounds []  - 0 Large Wound Dressing one or multiple wounds []  - 0 Application of Medications - topical []  - 0 Application of Medications - injection INTERVENTIONS - Miscellaneous []  - External ear exam 0 []  - 0 Specimen Collection (cultures, biopsies, blood, body fluids, etc.) []  - 0 Specimen(s) / Culture(s) sent or taken to Lab for analysis []  - 0 Patient Transfer (multiple staff / Civil Service fast streamer / Similar devices) []  - 0 Simple Staple / Suture removal (25 or less) []  - 0 Complex Staple / Suture removal (26 or more) []  - 0 Hypo / Hyperglycemic Management (close monitor of Blood Glucose) []  - 0 Ankle / Brachial Index (ABI) - do not check if billed separately X- 1 5 Vital Signs [name], [name] ([id number]) Has the patient been seen at the hospital within the last three years: Yes Total Score: 75 Level Of Care: New/Established - Level 2 Electronic Signature(s) Signed: 06/26/2018 4:54:17 PM By: Montey Hora Entered By: Montey Hora on 06/26/2018 10:34:55 [name] ([id number]) -------------------------------------------------------------------------------- Encounter Discharge Information Details Patient Name: [name] Date of Service: 06/26/2018 10:00 AM Medical Record Number: [id number] Patient Account Number: [id number] Date of Birth/Sex: [birth year] (84 y.o. M) Treating RN: Roger Shelter Primary Care Jannel Lynne: Burman Freestone  Other Clinician: Referring Berlynn Warsame: Burman Freestone Treating [place]rata [place]/Extender: [name], HOYT Weeks in Treatment: 20 Encounter Discharge Information Items Discharge Condition: Stable Ambulatory Status: Cane Discharge Destination: Home Transportation: Private Auto Accompanied By: daughter Schedule Follow-up Appointment: Yes Clinical Summary of Care: Electronic Signature(s) Signed: 06/26/2018 4:32:14 PM By: Roger Shelter Entered By: Roger Shelter on 06/26/2018 10:39:19 [name] ([id number]) -------------------------------------------------------------------------------- Lower Extremity Assessment Details Patient Name: [name] Date of Service: 06/26/2018 10:00 AM Medical Record Number: [id number] Patient Account Number: [id number] Date of Birth/Sex: Dec 18, 1930 (84 y.o. M) Treating RN: Secundino Ginger Primary Care Shawn Carattini: Burman Freestone Other Clinician: Referring Journii Nierman: Burman Freestone Treating Vashti Bolanos/Extender: [name], HOYT Weeks in Treatment: 20 Electronic Signature(s) Signed: 06/26/2018 3:05:43 PM By: Secundino Ginger Entered By: Secundino Ginger on 06/26/2018 10:04:30 [name] ([id number]) -------------------------------------------------------------------------------- Multi Wound Chart Details Patient Name: [name] Date of Service: 06/26/2018 10:00 AM Medical Record Number: [id number] Patient Account Number: [id number] Date of Birth/Sex: 12-Nov-1930 (84 y.o. M) Treating RN: Montey Hora Primary Care Orine Goga: Burman Freestone Other Clinician: Referring Willis Kuipers: Burman Freestone Treating Kristeen Lantz/Extender: [name], HOYT Weeks in Treatment: 20 Vital Signs Height(in): 69 Pulse(bpm): 8 Weight(lbs): 155 Blood Pressure(mmHg): 155/62 Body Mass Index(BMI): 23 Temperature(F): 98.2 Respiratory Rate 18 (breaths/min): Photos: [1:No Photos] [N/A:N/A] Wound Location: [1:Sacrum] [N/A:N/A] Wounding Event: [1:Pressure Injury]  [N/A:N/A] Primary Etiology: [1:Pressure Ulcer] [N/A:N/A] Comorbid History: [1:Cataracts, Asthma, Angina, Arrhythmia, Coronary Artery Disease, Hypertension, Myocardial Infarction, Osteoarthritis, Dementia, Neuropathy] [N/A:N/A] Date Acquired: [1:01/17/2018] [N/A:N/A] Weeks of Treatment: [1:20] [N/A:N/A] Wound Status: [1:Open] [N/A:N/A] Measurements L x W x D [1:0.2x0.2x2.9] [N/A:N/A] (cm) Area (cm) : [1:0.031] [N/A:N/A] Volume (cm) : [1:0.091] [N/A:N/A] % Reduction in Area: [1:99.80%] [N/A:N/A] % Reduction in Volume: [1:99.80%] [N/A:N/A] Classification: [1:Category/Stage III] [N/A:N/A] Exudate Amount: [1:Small] [N/A:N/A] Exudate Type: [1:Purulent] [N/A:N/A] Exudate Color: [1:yellow, brown, green] [N/A:N/A] Wound Margin: [1:Distinct, outline attached] [N/A:N/A] Granulation Amount: [1:None  Present (0%)] [N/A:N/A] Necrotic Amount: [1:Small (1-33%)] [N/A:N/A] Necrotic Tissue: [1:Eschar] [N/A:N/A] Exposed Structures: [1:Fat Layer (Subcutaneous Tissue) Exposed: Yes Fascia: No Tendon: No Muscle: No Joint: No Bone: No] [N/A:N/A] Epithelialization: [1:None] [N/A:N/A] Periwound Skin Texture: [1:Excoriation: Yes Induration: No] [N/A:N/A] Callus: No Crepitus: No Rash: No Scarring: No Periwound Skin Moisture: Maceration: No N/A N/A Dry/Scaly: No Periwound Skin Color: Erythema: Yes N/A N/A Atrophie Blanche: No Cyanosis: No Ecchymosis: No Hemosiderin Staining: No Mottled: No Pallor: No Rubor: No Erythema Location: Circumferential N/A N/A Temperature: No Abnormality N/A N/A Tenderness on Palpation: Yes N/A N/A Wound Preparation: Ulcer Cleansing: N/A N/A Rinsed/Irrigated with Saline Topical Anesthetic Applied: Other: lidocaine 4% Treatment Notes Electronic Signature(s) Signed: 06/26/2018 4:54:17 PM By: Montey Hora Entered By: Montey Hora on 06/26/2018 10:24:43 [name]  ([id number]) -------------------------------------------------------------------------------- [place] Details Patient Name: [name], [name]. Date of Service: 06/26/2018 10:00 AM Medical Record Number: [id number] Patient Account Number: [id number] Date of Birth/Sex: 06-Oct-1931 (84 y.o. M) Treating RN: Montey Hora Primary Care Lucina Betty: Burman Freestone Other Clinician: Referring Tiffanni Scarfo: Burman Freestone Treating Ikeisha Blumberg/Extender: [name], HOYT Weeks in Treatment: 20 Active Inactive ` Abuse / Safety / Falls / Self Care Management Nursing Diagnoses: History of Falls Goals: Patient will not experience any injury related to falls Date Initiated: 02/06/2018 Target Resolution Date: 03/08/2018 Goal Status: Active Interventions: Assess fall risk on admission and as needed Treatment Activities: Patient referred to home care : 02/06/2018 Notes: ` Necrotic Tissue Nursing Diagnoses: Impaired tissue integrity related to necrotic/devitalized tissue Goals: Necrotic/devitalized tissue will be minimized in the wound bed Date Initiated: 02/06/2018 Target Resolution Date: 03/08/2018 Goal Status: Active Interventions: Assess patient pain level pre-, during and post procedure and prior to discharge Treatment Activities: Excisional debridement : 02/06/2018 Notes: ` Nutrition Nursing Diagnoses: Potential for alteratiion in Nutrition/Potential for imbalanced nutrition [name], [name] ([id number]) Goals: Patient/caregiver agrees to and verbalizes understanding of need to obtain nutritional consultation Date Initiated: 02/06/2018 Target Resolution Date: 03/08/2018 Goal Status: Active Interventions: Provide education on nutrition Notes: ` Orientation to the Wound Care Program Nursing Diagnoses: Knowledge deficit related to the wound healing center program Goals: Patient/caregiver will verbalize understanding of the [facility] Date Initiated:  02/06/2018 Target Resolution Date: 03/08/2018 Goal Status: Active Interventions: Provide education on orientation to the wound center Notes: ` Pressure Nursing Diagnoses: Knowledge deficit related to management of pressures ulcers Goals: Patient/caregiver will verbalize understanding of pressure ulcer management Date Initiated: 02/06/2018 Target Resolution Date: 03/08/2018 Goal Status: Active Interventions: Assess offloading mechanisms upon admission and as needed Provide education on pressure ulcers Notes: ` Wound/Skin Impairment Nursing Diagnoses: Knowledge deficit related to ulceration/compromised skin integrity Goals: Ulcer/skin breakdown will have a volume reduction of 80% by week 12 Date Initiated: 02/06/2018 Target Resolution Date: 04/08/2018 Goal Status: Active [name], [name] ([id number]) Interventions: Assess ulceration(s) every visit Treatment Activities: Skin care regimen initiated : 02/06/2018 Topical wound management initiated : 02/06/2018 Notes: Electronic Signature(s) Signed: 06/26/2018 4:54:17 PM By: Montey Hora Entered By: Montey Hora on 06/26/2018 10:24:35 [name] ([id number]) -------------------------------------------------------------------------------- Pain Assessment Details Patient Name: [name] Date of Service: 06/26/2018 10:00 AM Medical Record Number: [id number] Patient Account Number: [id number] Date of Birth/Sex: [birth year] (84 y.o. M) Treating RN: Secundino Ginger Primary Care Virlan Kempker: Burman Freestone Other Clinician: Referring Luis Nickles: Burman Freestone Treating Prescious Hurless/Extender: [name], HOYT Weeks in Treatment: 20 Active Problems Location of Pain Severity and Description of Pain Patient Has Paino No Site Locations Pain Management and Medication Current Pain Management: Goals for Pain  Management pt denies any pain at this time. Electronic Signature(s) Signed: 06/26/2018 3:05:43 PM By: Secundino Ginger Entered By: Secundino Ginger on 06/26/2018 10:03:24 [name] ([id number]) -------------------------------------------------------------------------------- Patient/Caregiver Education Details Patient Name: [name] Date of Service: 06/26/2018 10:00 AM Medical Record Number: [id number] Patient Account Number: [id number] Date of Birth/Gender: [birth year] (84 y.o. M) Treating RN: Roger Shelter Primary Care Physician: Burman Freestone Other Clinician: Referring Physician: Burman Freestone Treating Physician/Extender: Sharalyn Ink in Treatment: 20 Education Assessment Education Provided To: Patient Education Topics Provided Wound Debridement: Handouts: Wound Debridement Methods: Explain/Verbal Responses: State content correctly Wound/Skin Impairment: Handouts: Caring for Your Ulcer Methods: Explain/Verbal Responses: State content correctly Electronic Signature(s) Signed: 06/26/2018 4:32:14 PM By: Roger Shelter Entered By: Roger Shelter on 06/26/2018 10:39:37 [name] ([id number]) -------------------------------------------------------------------------------- Wound Assessment Details Patient Name: [name] Date of Service: 06/26/2018 10:00 AM Medical Record Number: [id number] Patient Account Number: [id number] Date of Birth/Sex: Jul 14, 1931 (84 y.o. M) Treating RN: Montey Hora Primary Care Carinne Brandenburger: Burman Freestone Other Clinician: Referring Areon Cocuzza: Burman Freestone Treating Valor [place]/Extender: [name], HOYT Weeks in Treatment: 20 Wound Status Wound Number: 1 Primary Pressure Ulcer Etiology: Wound Location: Sacrum Wound Open Wounding Event: Pressure Injury Status: Date Acquired: 01/17/2018 Comorbid Cataracts, Asthma, Angina, Arrhythmia, Weeks Of Treatment: 20 History: Coronary Artery Disease, Hypertension, Clustered Wound: No Myocardial Infarction, Osteoarthritis, Dementia, Neuropathy Photos Photo Uploaded By: Secundino Ginger on 06/26/2018  11:22:33 Wound Measurements Length: (cm) 0.2 % Reductio Width: (cm) 0.2 % Reductio Depth: (cm) 2.9 Epithelial Area: (cm) 0.031 Tunneling Volume: (cm) 0.091 Undermini n in Area: 99.8% n in Volume: 99.8% ization: None : No ng: No Wound Description Classification: Category/Stage III Foul Odor Wound Margin: Distinct, outline attached Slough/Fib Exudate Amount: Small Exudate Type: Purulent Exudate Color: yellow, brown, green After Cleansing: No rino Yes Wound Bed Granulation Amount: None Present (0%) Exposed Structure Necrotic Amount: Small (1-33%) Fascia Exposed: No Necrotic Quality: Eschar Fat Layer (Subcutaneous Tissue) Exposed: Yes Tendon Exposed: No Muscle Exposed: No Joint Exposed: No Bone Exposed: No OLUFEMI, MOFIELD ([id number]) Periwound Skin Texture Texture Color No Abnormalities Noted: No No Abnormalities Noted: No Callus: No Atrophie Blanche: No Crepitus: No Cyanosis: No Excoriation: Yes Ecchymosis: No Induration: No Erythema: Yes Rash: No Erythema Location: Circumferential Scarring: No Hemosiderin Staining: No Mottled: No Moisture Pallor: No No Abnormalities Noted: No Rubor: No Dry / Scaly: No Maceration: No Temperature / Pain Temperature: No Abnormality Tenderness on Palpation: Yes Wound Preparation Ulcer Cleansing: Rinsed/Irrigated with Saline Topical Anesthetic Applied: Other: lidocaine 4%, Treatment Notes Wound #1 (Sacrum) 1. Cleansed with: Clean wound with Normal Saline 2. Anesthetic Topical Lidocaine 4% cream to wound bed prior to debridement 3. Peri-wound Care: Skin Prep 5. Secondary Dressing Applied Bordered Foam Dressing Notes drawtex over the iodoform packing Electronic Signature(s) Signed: 06/26/2018 4:54:17 PM By: Montey Hora Entered By: Montey Hora on 06/26/2018 10:24:25 [name] ([id number]) -------------------------------------------------------------------------------- [place] Details Patient Name:  [name] Date of Service: 06/26/2018 10:00 AM Medical Record Number: [id number] Patient Account Number: [id number] Date of Birth/Sex: 1931-03-18 (84 y.o. M) Treating RN: Secundino Ginger Primary Care Cameran Pettey: Burman Freestone Other Clinician: Referring Sachi Boulay: Burman Freestone Treating Tailey Top/Extender: [name], HOYT Weeks in Treatment: 20 Vital Signs Time Taken: 10:03 Temperature (F): 98.2 Height (in): 69 Pulse (bpm): 59 Weight (lbs): 155 Respiratory Rate (breaths/min): 18 Body Mass Index (BMI): 22.9 Blood Pressure (mmHg): 155/62 Reference Range: 80 - 120 mg / dl Electronic Signature(s) Signed: 06/26/2018 3:05:43 PM By: Secundino Ginger Entered By: Secundino Ginger  on 06/26/2018 10:04:14

## 2018-06-27 NOTE — Progress Notes (Signed)
DENT, PLANTZ ([id number]) Visit Report for 06/26/2018 Chief Complaint Document Details Patient Name: [name], [name]. Date of Service: 06/26/2018 10:00 AM Medical Record Number: [id number] Patient Account Number: [id number] Date of Birth/Sex: 12/22/1930 (84 y.o. M) Treating RN: Montey Hora Primary Care Provider: Burman Freestone Other Clinician: Referring Provider: Burman Freestone Treating Provider/Extender: [name], HOYT Weeks in Treatment: 20 Information Obtained from: Patient Chief Complaint Sacral pressure ulcer Electronic Signature(s) Signed: 06/26/2018 1:29:55 PM By: Worthy Keeler PA-C Entered By: Worthy Keeler on 06/26/2018 10:18:01 [name] ([id number]) -------------------------------------------------------------------------------- HPI Details Patient Name: [name] Date of Service: 06/26/2018 10:00 AM Medical Record Number: [id number] Patient Account Number: [id number] Date of Birth/Sex: [birth year] (84 y.o. M) Treating RN: Montey Hora Primary Care Provider: Burman Freestone Other Clinician: Referring Provider: Burman Freestone Treating Provider/Extender: [name], HOYT Weeks in Treatment: 20 History of Present Illness HPI Description: 02/06/18 on evaluation today patient presents for initial evaluation and our clinic concerning an issue which began roughly 3 weeks ago when the patient fell in his home on the floor in his kitchen and laid him down this detergent for roughly 3 days. He had a pressure injury to the left shoulder. This unfortunately has caused him a lot of discomfort although it finally seems to be doing better if anything is really having a lot of itching right now. This appears potentially be a contact dermatitis issue. He also has a significant pressure injury to the sacrum at this time as well which is also showing fascia exposure right over the bone but no evidence of bone exposure at this point which is good news. They have  been using Santyl as well as Saline soaked gauze at this point in time. There does appear to be a lot of necrotic slough in the base of the wound. He does have a history of incontinence, myocardial infarction, and hypertension. He also is "borderline diabetic" hemoglobin A1c of 6.0. Currently he has some discomfort in the pressure site at the sacrum but fortunately nothing too significant this did require sharp debridement today. 02/13/18 on evaluation today patient appears to be doing much better in regard to his sacral wound. He has been tolerating the dressing changes without complication with the Vashe. Fortunately there is no evidence of infection and though there is some Slough on the surface of the wound bed he has excellent granulation noted. Overall I'm pleased with how things have progressed in that regard. A glance at his shoulder as well and the rash seems to be someone improving in my pinion at this site as well. Overall I am pleased with what we're seeing and so is the family. 02/20/18 on evaluation today patient appears to be doing a little worse in regard to the sacral wound only in the fact that there is redness surrounding it has me somewhat concerned for infection. The drainage has also apparently been a little bit off color compared to normal according to family they did keep the dressing today that was removed to show me and I agree this seems to be a little bit different compared to what we have been seeing. Coupled with the redness I'm concerned he may be developing some cellulitis surrounding the wound bed. 02/27/18 on evaluation today patient presents for follow-up concerning his sacral ulcer. We have received the results back from his wound culture which shows unfortunately that the doxycycline will not be of benefit for him I am going to need to initiate treatment with something else  in order to treat the pseudomonas. Otherwise he does not seem to be having any  significant pain although his daughter states there are sometimes when he states having pain. We continue to use the Vashe currently. 03/06/18 on evaluation today patient's sacral wound appears to be doing better in my opinion. He has been tolerating the dressing changes without complication. With that being said the silver nitrate has helped with the prominent area of hyper granulation at the 6 o'clock location we will likely need to repeat this again today. Nonetheless overall I am pleased with how things have improved over the last week. The erythema surrounding the wound seems to be greatly improved. 03/13/18 on evaluation today patient's wound actually does not appear to be terribly infected although she does continue to have erythema surrounding the wound bed especially on the left border. I'm still somewhat concerned about the fact that the oral antibiotics alone may not be completely treating his infection. I previously discussed may need to go for IV antibiotic therapy I'm concerned that may be the case. We will need to make a referral today for infectious disease. 03/20/18 on evaluation today the patient sacral wound actually appears to be doing fairly well in regard to granulation although he continues unfortunately to have it your theme is surrounding the periwound region. There's also some increased swelling at the 6 o'clock location which also has me somewhat concerned. With that being said he does have some discomfort but nothing too significant at this point. He still has not heard from infectious disease his daughter and wife are both present during the office visit today they're going to check back with this again. We did get the information for them to call them today. 03/27/18 on evaluation today patient is seen concerning his ongoing sacral ulcer. He has been tolerating the dressing changes without complication. With that being said he does present with evidence of bright green  drainage noted on the dressing which again is something that I do often expect to see with a pseudomonas infection. He continues to have your theme is [name], [name] ([id number]) surrounding the wound bed as well and again I'm not 100% convinced this is just pressure related. I did speak with Colletta [place] who is the nurse practitioner in [place] with infectious disease. I spoke with her actually yesterday concerning this patient. She is not 100% convinced that this is infected. She question whether the wound may just be colonized with Pseudomonas and not actually causing an active infection. I am really not thinking that the edema is associated with pressure alone and again overall I don't feel that your theme and is consistent with a pressure injury either as he's never had any contusions noted like a deep tissue injury on his heel which was new and I did visualize today this was on the left heel. Nonetheless she wanted to give this a little bit more time and thought it would be appropriate to start the Wound VAC at this point. 04/03/18 on evaluation today patient sacral ulcer actually appears to be doing fairly well at this point. He has been tolerating the dressing changes without complication. With that being said I'm very pleased with the progress that has been made in regard to his sacral wound over the past week I do not see as much in the way of erythema which is great news. Nonetheless he does have a small area of hyper granulation unfortunately. This is at roughly the 7 o'clock location and I  think does need to be addressed so that this will heal more appropriately. Nonetheless I think we may be ready to go ahead and initiate therapy with the Wound VAC. 04/10/18 on evaluation today patient appears to be doing excellent in regard to his sacral ulcer. The show signs of great improvement in overall I'm very pleased with how things look. He has been tolerating the dressing changes  without complication. Specifically this is the Wound VAC. He also seems to be doing well with the antibiotic there is decreased your theme and redness surrounding the sacral area at this point in the wound has filled in quite significantly. 04/17/18 on evaluation today patient actually appears to be doing excellent in regard to his sacral ulcer. He's been tolerating the dressing changes without complication specifically the Wound VAC. There really are no major concerns from the patient nor family this point he is having no pain. He does have a little bit of Epiboly on the lateral portions of the wound where he does have a little bit more depth that will need to be addressed today. 04/23/18 on evaluation today patient's wound actually appears to be doing excellent at this point. He has been tolerating the Wound VAC and this appears to be doing well other than the fact that it seems to be breaking seal at the 6 o'clock location. I do believe that adding a duodenum dressing at this location try and help maintain the seal would be appropriate and likely very effective. With that being said he overall seems to be showing signs of good improvement at this point. There does not appear to be any evidence of significant infection which is also excellent news. 04/30/18 on evaluation today patient actually appears to be doing well in regard to his sacral ulcer. He's been tolerating the dressing changes without complication. Fortunately there does not appear to be any evidence of infection. Overall I'm very pleased with the progress that has been made up to this point. He does have some blistering underneath the draping unfortunately although this is definitely something that has been noted on other patients previously is a fairly common occurrence. Nonetheless the patient seems to be doing fairly well in general in my pinion based on what I see at this time. I do believe these are fairly superficial and  minor. 05/07/18 on evaluation today patient's wound actually appears to be doing excellent at this point in time. He has been tolerating the Wound VAC decently well he states that it is somewhat cumbersome to carry around unfortunately. The only other issue he's been having according to family is that they been having a difficult time keeping the Wound VAC in place and doing what is supposed to do without making. Obviously I do think that this is definitely of concern. Nonetheless I do believe she's made good progress up to this point. I'm very happy in that regard. 05/14/18 on evaluation today patient's wound continues to make good progress at this point. He had a minimal amount of slough noted on the surface which was easily wiped away with saline and gauze and in general I feel like that he is continuing to show excellent progress even with the discontinuation of the Wound VAC. Overall I'm pleased in this regard. He was having issues with the Wound VAC in getting it to seal I think that using the Prisma at this time has been equally efficient and getting the wound to diminish in size. 05/29/18- He is here in follow up  evaluation for a sacral ulcer. There is improvement, we will continue with prisma and he will follow up in two weeks 06/11/18 on evaluation today patient had unfortunately bright green drainage on the dressing upon evaluation today. This is something that we have encountered before although we were able to get things under control previously with antibiotics. With that being said currently upon further inspection of the three areas of hyper granulation that were separate from the actual wound itself which was almost healed it really appears that these all have some depth to them. They are more tunnels that really have not closed or at least have reopened as a result likely of infection in my pinion. This is definitely not what I was expecting or hoping for. [name], [name]  ([id number]) 06/26/18 on evaluation today patient continues to experience issues with what appears to be small abscesses in the sacral region unfortunately. With that being said he has been tolerating the dressing changes without complication which is good news. He's not having any significant discomfort which is also good news. With that being said his daughter states that after he left last week that the packing that we have placed fell out quite rapidly and he subsequently healed over very quick to the point they were not able to even repack the regions. Nonetheless there appear to be several fluctuance areas noted at this point there's one central region that does seem to be draining still discharge that is somewhat green in color. I did review the results of the wound culture which did show evidence of infection with both MRSA as well as pseudomonas based on that result. Nonetheless again with his other current medications we are not able to do the Cipro due to issues with potential long QT syndrome. I am going to give him a prescription for doxycycline in order to help with the potential MRSA infection. Electronic Signature(s) Signed: 06/26/2018 1:29:55 PM By: Worthy Keeler PA-C Entered By: Worthy Keeler on 06/26/2018 13:26:02 [name] ([id number]) -------------------------------------------------------------------------------- Physical Exam Details Patient Name: ALEJANDRA, BARNA Date of Service: 06/26/2018 10:00 AM Medical Record Number: [id number] Patient Account Number: [id number] Date of Birth/Sex: 1930-11-30 (84 y.o. M) Treating RN: Montey Hora Primary Care Provider: Burman Freestone Other Clinician: Referring Provider: Burman Freestone Treating Provider/Extender: STONE III, HOYT Weeks in Treatment: 59 Constitutional Well-nourished and well-hydrated in no acute distress. Respiratory normal breathing without difficulty. clear to auscultation  bilaterally. Cardiovascular regular rate and rhythm with normal S1, S2. Psychiatric this patient is able to make decisions and demonstrates good insight into disease process. Alert and Oriented x 3. pleasant and cooperative. Notes Patient's wound bed at this point in time actually shows your theme is surrounding the margin of the wound. Again the original ulcer that we were treating has actually completely resolved at this point there is nothing remaining open. Unfortunately the small abscess areas are still draining and again they do not seem to want to stay open the packing came out of this epithelial eyes back over his body actually seems to really want to heal but nonetheless he is having some issues potentially with infection and I think may be causing some issues here. Electronic Signature(s) Signed: 06/26/2018 1:29:55 PM By: Worthy Keeler PA-C Entered By: Worthy Keeler on 06/26/2018 13:26:44 [name] ([id number]) -------------------------------------------------------------------------------- Physician Orders Details Patient Name: SHAROD, PETSCH Date of Service: 06/26/2018 10:00 AM Medical Record Number: [id number] Patient Account Number: [id number] Date of Birth/Sex: Mar 29, 1931 (84 y.o.  M) Treating RN: Montey Hora Primary Care Provider: Burman Freestone Other Clinician: Referring Provider: Burman Freestone Treating Provider/Extender: [name], HOYT Weeks in Treatment: 20 Verbal / Phone Orders: No Diagnosis Coding ICD-10 Coding Code Description L89.154 Pressure ulcer of sacral region, stage 4 L24.0 Irritant contact dermatitis due to detergents Wound Cleansing Wound #1 Sacrum o Clean wound with Normal Saline. Anesthetic (add to Medication List) Wound #1 Sacrum o Topical Lidocaine 4% cream applied to wound bed prior to debridement (In Clinic Only). Skin Barriers/Peri-Wound Care Wound #1 Sacrum o Skin Prep o Triamcinolone Acetonide Ointment (TCA) -  cream in clinic on red areas around wound where tape has irritated skin Primary Wound Dressing Wound #1 Sacrum o Iodoform packing Gauze - packing into lower wound area approximately 3cm deep. Secondary Dressing Wound #1 Sacrum o Boardered Foam Dressing - use large border foam to cover entire wound o Drawtex - over the three areas near base of wound due to drainage. Dressing Change Frequency Wound #1 Sacrum o Change Dressing Monday, Wednesday, Friday - HHRN to provide wound care on Wednesday and Friday. Patient to come to clinic on Monday. Follow-up Appointments Wound #1 Sacrum o Return Appointment in 2 weeks. Off-Loading Wound #1 Sacrum o Mattress - hospital bed o Turn and reposition every 2 hours o Other: - please make sure the pt is floating his heels while lying in bed [name], [name]. ([id number]) Additional Orders / Instructions Wound #1 Sacrum o Increase protein intake. Home Health Wound #1 [facility] Visits - [facility] to provide wound care on Wednesday and Friday patient comes to clinic on Mondays o Home Health Nurse may visit PRN to address patientos wound care needs. o FACE TO FACE ENCOUNTER: MEDICARE and MEDICAID PATIENTS: I certify that this patient is under my care and that I had a face-to-face encounter that meets the physician face-to-face encounter requirements with this patient on this date. The encounter with the patient was in whole or in part for the following MEDICAL CONDITION: (primary reason for [place]) MEDICAL NECESSITY: I certify, that based on my findings, NURSING services are a medically necessary home health service. HOME BOUND STATUS: I certify that my clinical findings support that this patient is homebound (i.e., Due to illness or injury, pt requires aid of supportive devices such as crutches, cane, wheelchairs, walkers, the use of special transportation or the assistance of another person to  leave their place of residence. There is a normal inability to leave the home and doing so requires considerable and taxing effort. Other absences are for medical reasons / religious services and are infrequent or of short duration when for other reasons). o If current dressing causes regression in wound condition, may D/C ordered dressing product/s and apply Normal Saline Moist Dressing daily until next [place] / Other MD appointment. [place] of regression in wound condition at [phone]. o Please direct any NON-WOUND related issues/requests for orders to patient's Primary Care Physician Radiology o MRI, lower extremity without contrast Patient Medications Allergies: Lyrica, gabapentin, Sulfa (Sulfonamide Antibiotics), contrast dye, budesonide, iohexol, simvastatin, Demerol, meperidine, adhesive tape, naproxen, pregabalin Notifications Medication Indication Start End doxycycline hyclate 06/26/2018 DOSE 1 - oral 100 mg capsule - 1 capsule oral taken 2 times a day for 14 days or until follow-up with ID Electronic Signature(s) Signed: 06/26/2018 1:28:59 PM By: Worthy Keeler PA-C Entered By: Worthy Keeler on 06/26/2018 13:28:58 [name] ([id number]) -------------------------------------------------------------------------------- Problem List Details Patient Name: Mancel Bale,  Victoriano J. Date of Service: 06/26/2018 10:00 AM Medical Record Number: [id number] Patient Account Number: [id number] Date of Birth/Sex: 05-07-1931 (84 y.o. M) Treating RN: Montey Hora Primary Care Provider: Burman Freestone Other Clinician: Referring Provider: Burman Freestone Treating Provider/Extender: [name], HOYT Weeks in Treatment: 20 Active Problems ICD-10 Evaluated Encounter Code Description Active Date Today Diagnosis L89.154 Pressure ulcer of sacral region, stage 4 02/06/2018 No Yes L24.0 Irritant contact dermatitis due to detergents 02/06/2018 No  Yes Inactive Problems Resolved Problems Electronic Signature(s) Signed: 06/26/2018 1:29:55 PM By: Worthy Keeler PA-C Entered By: Worthy Keeler on 06/26/2018 10:17:55 [name] ([id number]) -------------------------------------------------------------------------------- Progress Note Details Patient Name: [name] Date of Service: 06/26/2018 10:00 AM Medical Record Number: [id number] Patient Account Number: [id number] Date of Birth/Sex: 1930-10-20 (84 y.o. M) Treating RN: Montey Hora Primary Care Provider: Burman Freestone Other Clinician: Referring Provider: Burman Freestone Treating Provider/Extender: [name], HOYT Weeks in Treatment: 20 Subjective Chief Complaint Information obtained from Patient Sacral pressure ulcer History of Present Illness (HPI) 02/06/18 on evaluation today patient presents for initial evaluation and our clinic concerning an issue which began roughly 3 weeks ago when the patient fell in his home on the floor in his kitchen and laid him down this detergent for roughly 3 days. He had a pressure injury to the left shoulder. This unfortunately has caused him a lot of discomfort although it finally seems to be doing better if anything is really having a lot of itching right now. This appears potentially be a contact dermatitis issue. He also has a significant pressure injury to the sacrum at this time as well which is also showing fascia exposure right over the bone but no evidence of bone exposure at this point which is good news. They have been using Santyl as well as Saline soaked gauze at this point in time. There does appear to be a lot of necrotic slough in the base of the wound. He does have a history of incontinence, myocardial infarction, and hypertension. He also is "borderline diabetic" hemoglobin A1c of 6.0. Currently he has some discomfort in the pressure site at the sacrum but fortunately nothing too significant this did  require sharp debridement today. 02/13/18 on evaluation today patient appears to be doing much better in regard to his sacral wound. He has been tolerating the dressing changes without complication with the Vashe. Fortunately there is no evidence of infection and though there is some Slough on the surface of the wound bed he has excellent granulation noted. Overall I'm pleased with how things have progressed in that regard. A glance at his shoulder as well and the rash seems to be someone improving in my pinion at this site as well. Overall I am pleased with what we're seeing and so is the family. 02/20/18 on evaluation today patient appears to be doing a little worse in regard to the sacral wound only in the fact that there is redness surrounding it has me somewhat concerned for infection. The drainage has also apparently been a little bit off color compared to normal according to family they did keep the dressing today that was removed to show me and I agree this seems to be a little bit different compared to what we have been seeing. Coupled with the redness I'm concerned he may be developing some cellulitis surrounding the wound bed. 02/27/18 on evaluation today patient presents for follow-up concerning his sacral ulcer. We have received the results back from his wound culture which  shows unfortunately that the doxycycline will not be of benefit for him I am going to need to initiate treatment with something else in order to treat the pseudomonas. Otherwise he does not seem to be having any significant pain although his daughter states there are sometimes when he states having pain. We continue to use the Vashe currently. 03/06/18 on evaluation today patient's sacral wound appears to be doing better in my opinion. He has been tolerating the dressing changes without complication. With that being said the silver nitrate has helped with the prominent area of hyper granulation at the 6 o'clock location  we will likely need to repeat this again today. Nonetheless overall I am pleased with how things have improved over the last week. The erythema surrounding the wound seems to be greatly improved. 03/13/18 on evaluation today patient's wound actually does not appear to be terribly infected although she does continue to have erythema surrounding the wound bed especially on the left border. I'm still somewhat concerned about the fact that the oral antibiotics alone may not be completely treating his infection. I previously discussed may need to go for IV antibiotic therapy I'm concerned that may be the case. We will need to make a referral today for infectious disease. 03/20/18 on evaluation today the patient sacral wound actually appears to be doing fairly well in regard to granulation although he continues unfortunately to have it your theme is surrounding the periwound region. There's also some increased swelling at the 6 o'clock location which also has me somewhat concerned. With that being said he does have some discomfort but [name], [name]. ([id number]) nothing too significant at this point. He still has not heard from infectious disease his daughter and wife are both present during the office visit today they're going to check back with this again. We did get the information for them to call them today. 03/27/18 on evaluation today patient is seen concerning his ongoing sacral ulcer. He has been tolerating the dressing changes without complication. With that being said he does present with evidence of bright green drainage noted on the dressing which again is something that I do often expect to see with a pseudomonas infection. He continues to have your theme is surrounding the wound bed as well and again I'm not 100% convinced this is just pressure related. I did speak with Colletta Maryland who is the nurse practitioner in [place] with infectious disease. I spoke with her actually yesterday concerning  this patient. She is not 100% convinced that this is infected. She question whether the wound may just be colonized with Pseudomonas and not actually causing an active infection. I am really not thinking that the edema is associated with pressure alone and again overall I don't feel that your theme and is consistent with a pressure injury either as he's never had any contusions noted like a deep tissue injury on his heel which was new and I did visualize today this was on the left heel. Nonetheless she wanted to give this a little bit more time and thought it would be appropriate to start the Wound VAC at this point. 04/03/18 on evaluation today patient sacral ulcer actually appears to be doing fairly well at this point. He has been tolerating the dressing changes without complication. With that being said I'm very pleased with the progress that has been made in regard to his sacral wound over the past week I do not see as much in the way of erythema which is  great news. Nonetheless he does have a small area of hyper granulation unfortunately. This is at roughly the 7 o'clock location and I think does need to be addressed so that this will heal more appropriately. Nonetheless I think we may be ready to go ahead and initiate therapy with the Wound VAC. 04/10/18 on evaluation today patient appears to be doing excellent in regard to his sacral ulcer. The show signs of great improvement in overall I'm very pleased with how things look. He has been tolerating the dressing changes without complication. Specifically this is the Wound VAC. He also seems to be doing well with the antibiotic there is decreased your theme and redness surrounding the sacral area at this point in the wound has filled in quite significantly. 04/17/18 on evaluation today patient actually appears to be doing excellent in regard to his sacral ulcer. He's been tolerating the dressing changes without complication specifically the Wound  VAC. There really are no major concerns from the patient nor family this point he is having no pain. He does have a little bit of Epiboly on the lateral portions of the wound where he does have a little bit more depth that will need to be addressed today. 04/23/18 on evaluation today patient's wound actually appears to be doing excellent at this point. He has been tolerating the Wound VAC and this appears to be doing well other than the fact that it seems to be breaking seal at the 6 o'clock location. I do believe that adding a duodenum dressing at this location try and help maintain the seal would be appropriate and likely very effective. With that being said he overall seems to be showing signs of good improvement at this point. There does not appear to be any evidence of significant infection which is also excellent news. 04/30/18 on evaluation today patient actually appears to be doing well in regard to his sacral ulcer. He's been tolerating the dressing changes without complication. Fortunately there does not appear to be any evidence of infection. Overall I'm very pleased with the progress that has been made up to this point. He does have some blistering underneath the draping unfortunately although this is definitely something that has been noted on other patients previously is a fairly common occurrence. Nonetheless the patient seems to be doing fairly well in general in my pinion based on what I see at this time. I do believe these are fairly superficial and minor. 05/07/18 on evaluation today patient's wound actually appears to be doing excellent at this point in time. He has been tolerating the Wound VAC decently well he states that it is somewhat cumbersome to carry around unfortunately. The only other issue he's been having according to family is that they been having a difficult time keeping the Wound VAC in place and doing what is supposed to do without making. Obviously I do think that  this is definitely of concern. Nonetheless I do believe she's made good progress up to this point. I'm very happy in that regard. 05/14/18 on evaluation today patient's wound continues to make good progress at this point. He had a minimal amount of slough noted on the surface which was easily wiped away with saline and gauze and in general I feel like that he is continuing to show excellent progress even with the discontinuation of the Wound VAC. Overall I'm pleased in this regard. He was having issues with the Wound VAC in getting it to seal I think that using the  Prisma at this time has been equally efficient and getting the wound to diminish in size. 05/29/18- He is here in follow up evaluation for a sacral ulcer. There is improvement, we will continue with prisma and he will follow up in two weeks [name], [name] ([id number]) 06/11/18 on evaluation today patient had unfortunately bright green drainage on the dressing upon evaluation today. This is something that we have encountered before although we were able to get things under control previously with antibiotics. With that being said currently upon further inspection of the three areas of hyper granulation that were separate from the actual wound itself which was almost healed it really appears that these all have some depth to them. They are more tunnels that really have not closed or at least have reopened as a result likely of infection in my pinion. This is definitely not what I was expecting or hoping for. 06/26/18 on evaluation today patient continues to experience issues with what appears to be small abscesses in the sacral region unfortunately. With that being said he has been tolerating the dressing changes without complication which is good news. He's not having any significant discomfort which is also good news. With that being said his daughter states that after he left last week that the packing that we have placed fell out quite  rapidly and he subsequently healed over very quick to the point they were not able to even repack the regions. Nonetheless there appear to be several fluctuance areas noted at this point there's one central region that does seem to be draining still discharge that is somewhat green in color. I did review the results of the wound culture which did show evidence of infection with both MRSA as well as pseudomonas based on that result. Nonetheless again with his other current medications we are not able to do the Cipro due to issues with potential long QT syndrome. I am going to give him a prescription for doxycycline in order to help with the potential MRSA infection. Patient History Information obtained from Patient. Family History Cancer - Paternal Grandparents, Diabetes - Father, Heart Disease - Mother,Father, Stroke - Father, No family history of Hypertension, Kidney Disease, Lung Disease, Seizures, Thyroid Problems, Tuberculosis. Social History Never smoker, Marital Status - Widowed, Alcohol Use - Never, Drug Use - No History, Caffeine Use - Daily. Medical History Hospitalization/Surgery History - 01/20/2018, [facility], [facility]. 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 Respiratory The patient has no complaints or symptoms. Cardiovascular The patient has no complaints or symptoms. Psychiatric The patient has no complaints or symptoms. Objective Constitutional Well-nourished and well-hydrated in no acute distress. [name], [name] ([id number]) Vitals Time Taken: 10:03 AM, Height: 69 in, Weight: 155 lbs, BMI: 22.9, Temperature: 98.2 F, Pulse: 59 bpm, Respiratory Rate: 18 breaths/min, Blood Pressure: 155/62 mmHg. Respiratory normal breathing without difficulty. clear to auscultation bilaterally. Cardiovascular regular rate and rhythm  with normal S1, S2. Psychiatric this patient is able to make decisions and demonstrates good insight into disease process. Alert and Oriented x 3. pleasant and cooperative. General Notes: Patient's wound bed at this point in time actually shows your theme is surrounding the margin of the wound. Again the original ulcer that we were treating has actually completely resolved at this point there is nothing remaining open. Unfortunately the small abscess areas are still draining and again they do not seem to want  to stay open the packing came out of this epithelial eyes back over his body actually seems to really want to heal but nonetheless he is having some issues potentially with infection and I think may be causing some issues here. Integumentary (Hair, Skin) Wound #1 status is Open. Original cause of wound was Pressure Injury. The wound is located on the Sacrum. The wound measures 0.2cm length x 0.2cm width x 2.9cm depth; 0.031cm^2 area and 0.091cm^3 volume. There is Fat Layer (Subcutaneous Tissue) Exposed exposed. There is no tunneling or undermining noted. There is a small amount of purulent drainage noted. The wound margin is distinct with the outline attached to the wound base. There is no granulation within the wound bed. There is a small (1-33%) amount of necrotic tissue within the wound bed including Eschar. The periwound skin appearance exhibited: Excoriation, Erythema. The periwound skin appearance did not exhibit: Callus, Crepitus, Induration, Rash, Scarring, Dry/Scaly, Maceration, Atrophie Blanche, Cyanosis, Ecchymosis, Hemosiderin Staining, Mottled, Pallor, Rubor. The surrounding wound skin color is noted with erythema which is circumferential. Periwound temperature was noted as No Abnormality. The periwound has tenderness on palpation. Assessment Active Problems ICD-10 Pressure ulcer of sacral region, stage 4 Irritant contact dermatitis due to detergents Plan Wound  Cleansing: Wound #1 Sacrum: Clean wound with Normal Saline. Anesthetic (add to Medication List): Wound #1 Sacrum: Topical Lidocaine 4% cream applied to wound bed prior to debridement (In Clinic Only). Skin Barriers/Peri-Wound Care: [name], [name] ([id number]) Wound #1 Sacrum: Skin Prep Triamcinolone Acetonide Ointment (TCA) - cream in clinic on red areas around wound where tape has irritated skin Primary Wound Dressing: Wound #1 Sacrum: Iodoform packing Gauze - packing into lower wound area approximately 3cm deep. Secondary Dressing: Wound #1 Sacrum: Boardered Foam Dressing - use large border foam to cover entire wound Drawtex - over the three areas near base of wound due to drainage. Dressing Change Frequency: Wound #1 Sacrum: Change Dressing Monday, Wednesday, Friday - HHRN to provide wound care on Wednesday and Friday. Patient to come to clinic on Monday. Follow-up Appointments: Wound #1 Sacrum: Return Appointment in 2 weeks. Off-Loading: Wound #1 Sacrum: Mattress - hospital bed Turn and reposition every 2 hours Other: - please make sure the pt is floating his heels while lying in bed Additional Orders / Instructions: Wound #1 Sacrum: Increase protein intake. Home Health: Wound #1 Sacrum: Continue Home Health Visits - [facility] to provide wound care on Wednesday and Friday patient comes to clinic on Mondays Home Health Nurse may visit PRN to address patient s wound care needs. FACE TO FACE ENCOUNTER: MEDICARE and MEDICAID PATIENTS: I certify that this patient is under my care and that I had a face-to-face encounter that meets the physician face-to-face encounter requirements with this patient on this date. The encounter with the patient was in whole or in part for the following MEDICAL CONDITION: (primary reason for [place]) MEDICAL NECESSITY: I certify, that based on my findings, NURSING services are a medically necessary home health service. HOME BOUND  STATUS: I certify that my clinical findings support that this patient is homebound (i.e., Due to illness or injury, pt requires aid of supportive devices such as crutches, cane, wheelchairs, walkers, the use of special transportation or the assistance of another person to leave their place of residence. There is a normal inability to leave the home and doing so requires considerable and taxing effort. Other absences are for medical reasons / religious services and are infrequent or of short duration when  for other reasons). If current dressing causes regression in wound condition, may D/C ordered dressing product/s and apply Normal Saline Moist Dressing daily until next [place] / Other MD appointment. [place] of regression in wound condition at [phone]. Please direct any NON-WOUND related issues/requests for orders to patient's Primary Care Physician Radiology ordered were: MRI, lower extremity without contrast The following medication(s) was prescribed: doxycycline hyclate oral 100 mg capsule 1 1 capsule oral taken 2 times a day for 14 days or until follow-up with ID starting 06/26/2018 At this point my recommendation is going to be that we see about referring back to infectious disease. I did speak with Janene Madeira who is the nurse practitioner there whom I previously referred him to. She does believe based on what we're saying that likely the patient may need so IV antibiotic therapy the question is gonna be for how long. For that reason we're gonna see about getting an MRI of the sacrum to see whether or not there's anything showing up that would indicate osteomyelitis. I was he that would indicate a potential for a longer antibiotic therapy versus if there is no osteomyelitis. The patient and his daughter are in agreement with plan. Subsequently I'll see him back for reevaluation to see were things stand. [name], [name] ([id number]) Please see above  for specific wound care orders. We will see patient for re-evaluation in 2 week(s) here in the clinic. If anything worsens or changes patient will contact our office for additional recommendations. Electronic Signature(s) Signed: 06/26/2018 1:29:55 PM By: Worthy Keeler PA-C Entered By: Worthy Keeler on 06/26/2018 13:29:09 [name] ([id number]) -------------------------------------------------------------------------------- ROS/PFSH Details Patient Name: [name] Date of Service: 06/26/2018 10:00 AM Medical Record Number: [id number] Patient Account Number: [id number] Date of Birth/Sex: 10/20/1930 (84 y.o. M) Treating RN: Montey Hora Primary Care Provider: Burman Freestone Other Clinician: Referring Provider: Burman Freestone Treating Provider/Extender: [name], HOYT Weeks in Treatment: 20 Information Obtained From Patient Wound History Do you currently have one or more open woundso Yes How many open wounds do you currently haveo 2 Approximately how long have you had your woundso 3 weeks Has your wound(s) ever healed and then re-openedo Yes Have you had any lab work done in the past montho Yes Have you tested positive for osteomyelitis (bone infection)o No Have you had any tests for circulation on your legso No Constitutional Symptoms (General Health) Complaints and Symptoms: Negative for: Fever; Chills Eyes Medical History: Positive for: Cataracts - bilateral removal Negative for: Glaucoma; Optic Neuritis Ear/Nose/Mouth/Throat Medical History: Negative for: Chronic sinus problems/congestion; Middle ear problems Hematologic/Lymphatic Medical History: Negative for: Anemia; Hemophilia; Human Immunodeficiency Virus; Lymphedema; Sickle Cell Disease Respiratory Complaints and Symptoms: No Complaints or Symptoms Medical History: Positive for: Asthma Negative for: Aspiration; Chronic Obstructive Pulmonary Disease (COPD); Pneumothorax; Sleep Apnea;  Tuberculosis Cardiovascular Complaints and Symptoms: No Complaints or Symptoms Medical History: Positive for: Angina; Arrhythmia; Coronary Artery Disease; Hypertension; Myocardial Infarction - 2001 Negative for: Congestive Heart Failure; Deep Vein Thrombosis; Hypotension; Peripheral Arterial Disease; Peripheral [name], [name] ([id number]) Venous Disease; Phlebitis; Vasculitis Gastrointestinal Medical History: Negative for: Cirrhosis ; Colitis; Crohnos; Hepatitis A; Hepatitis B; Hepatitis C Endocrine Medical History: Negative for: Type I Diabetes; Type II Diabetes Past Medical History Notes: Borderline Genitourinary Medical History: Negative for: End Stage Renal Disease Immunological Medical History: Negative for: Lupus Erythematosus; Raynaudos; Scleroderma Integumentary (Skin) Medical History: Negative for: History of Burn; History of pressure wounds Musculoskeletal Medical History: Positive for:  Osteoarthritis Negative for: Gout; Rheumatoid Arthritis; Osteomyelitis Neurologic Medical History: Positive for: Dementia; Neuropathy Negative for: Quadriplegia; Paraplegia; Seizure Disorder Oncologic Medical History: Negative for: Received Chemotherapy; Received Radiation Past Medical History Notes: Melanoma on back Psychiatric Complaints and Symptoms: No Complaints or Symptoms Medical History: Negative for: Anorexia/bulimia; Confinement Anxiety HBO Extended History Items Eyes: Cataracts [name], [name] ([id number]) Immunizations Pneumococcal Vaccine: Received Pneumococcal Vaccination: Yes Implantable Devices Hospitalization / Surgery History Name of Hospital Purpose of Hospitalization/Surgery Date [facility] 01/20/2018 Family and Social History Cancer: Yes - Paternal Grandparents; Diabetes: Yes - Father; Heart Disease: Yes - Mother,Father; Hypertension: No; Kidney Disease: No; Lung Disease: No; Seizures: No; Stroke: Yes - Father; Thyroid Problems: No; Tuberculosis:  No; Never smoker; Marital Status - Widowed; Alcohol Use: Never; Drug Use: No History; Caffeine Use: Daily; Financial Concerns: No; Food, Clothing or Shelter Needs: No; Support System Lacking: No; Transportation Concerns: No; Advanced Directives: Yes (Not Provided); Patient does not want information on Advanced Directives; Do not resuscitate: Yes (Not Provided); Living Will: Yes (Not Provided); Medical Power of Attorney: Yes - [name] (Not Provided) Physician Affirmation I have reviewed and agree with the above information. Electronic Signature(s) Signed: 06/26/2018 1:29:55 PM By: Worthy Keeler PA-C Signed: 06/26/2018 4:54:17 PM By: Montey Hora Entered By: Worthy Keeler on 06/26/2018 13:26:23 [name] ([id number]) -------------------------------------------------------------------------------- SuperBill Details Patient Name: [name] Date of Service: 06/26/2018 Medical Record Number: [id number] Patient Account Number: [id number] Date of Birth/Sex: 02/01/1931 (84 y.o. M) Treating RN: Montey Hora Primary Care Provider: Burman Freestone Other Clinician: Referring Provider: Burman Freestone Treating Provider/Extender: [name], HOYT Weeks in Treatment: 20 Diagnosis Coding ICD-10 Codes Code Description L89.154 Pressure ulcer of sacral region, stage 4 L24.0 Irritant contact dermatitis due to detergents Facility Procedures CPT4 Code: [id number] Description: [phone] - WOUND CARE VISIT-LEV 2 EST PT Modifier: Quantity: 1 Physician Procedures CPT4 Code: [id number] Description: 40086 - WC PHYS LEVEL 4 - EST PT ICD-10 Diagnosis Description L89.154 Pressure ulcer of sacral region, stage 4 Modifier: Quantity: 1 Electronic Signature(s) Signed: 06/26/2018 1:29:55 PM By: Worthy Keeler PA-C Entered By: Worthy Keeler on 06/26/2018 13:29:27

## 2018-07-09 ENCOUNTER — Encounter: Payer: Medicare Other | Admitting: Physician Assistant

## 2018-07-10 ENCOUNTER — Ambulatory Visit
Admission: RE | Admit: 2018-07-10 | Discharge: 2018-07-10 | Disposition: A | Discharge status: Home / Self Care | Payer: Medicare Other | Source: Ambulatory Visit | Attending: Physician Assistant | Admitting: Physician Assistant

## 2018-07-10 DIAGNOSIS — M4628 Osteomyelitis of vertebra, sacral and sacrococcygeal region: Secondary | ICD-10-CM | POA: Insufficient documentation

## 2018-07-10 DIAGNOSIS — L89154 Pressure ulcer of sacral region, stage 4: Secondary | ICD-10-CM | POA: Diagnosis not present

## 2018-07-11 NOTE — Progress Notes (Signed)
[name], [name] ([id number]) Visit Report for 07/09/2018 Chief Complaint Document Details Patient Name: [name], [name]. Date of Service: 07/09/2018 2:45 PM Medical Record Number: [id number] Patient Account Number: [id number] Date of Birth/Sex: December 06, 1930 (84 y.o. M) Treating RN: Roger Shelter Primary Care Provider: Burman Freestone Other Clinician: Referring Provider: Burman Freestone Treating Provider/Extender: [name], HOYT Weeks in Treatment: 21 Information Obtained from: Patient Chief Complaint Sacral pressure ulcer Electronic Signature(s) Signed: 07/10/2018 9:18:02 AM By: Worthy Keeler PA-C Entered By: Worthy Keeler on 07/09/2018 15:29:12 KEVION, FATHEREE ([id number]) -------------------------------------------------------------------------------- HPI Details Patient Name: [name] Date of Service: 07/09/2018 2:45 PM Medical Record Number: [id number] Patient Account Number: [id number] Date of Birth/Sex: 1931/08/01 (84 y.o. M) Treating RN: Roger Shelter Primary Care Provider: Burman Freestone Other Clinician: Referring Provider: Burman Freestone Treating Provider/Extender: [name], HOYT Weeks in Treatment: 21 History of Present Illness HPI Description: 02/06/18 on evaluation today patient presents for initial evaluation and our clinic concerning an issue which began roughly 3 weeks ago when the patient fell in his home on the floor in his kitchen and laid him down this detergent for roughly 3 days. He had a pressure injury to the left shoulder. This unfortunately has caused him a lot of discomfort although it finally seems to be doing better if anything is really having a lot of itching right now. This appears potentially be a contact dermatitis issue. He also has a significant pressure injury to the sacrum at this time as well which is also showing fascia exposure right over the bone but no evidence of bone exposure at this point which is good news. They have  been using Santyl as well as Saline soaked gauze at this point in time. There does appear to be a lot of necrotic slough in the base of the wound. He does have a history of incontinence, myocardial infarction, and hypertension. He also is "borderline diabetic" hemoglobin A1c of 6.0. Currently he has some discomfort in the pressure site at the sacrum but fortunately nothing too significant this did require sharp debridement today. 02/13/18 on evaluation today patient appears to be doing much better in regard to his sacral wound. He has been tolerating the dressing changes without complication with the Vashe. Fortunately there is no evidence of infection and though there is some Slough on the surface of the wound bed he has excellent granulation noted. Overall I'm pleased with how things have progressed in that regard. A glance at his shoulder as well and the rash seems to be someone improving in my pinion at this site as well. Overall I am pleased with what we're seeing and so is the family. 02/20/18 on evaluation today patient appears to be doing a little worse in regard to the sacral wound only in the fact that there is redness surrounding it has me somewhat concerned for infection. The drainage has also apparently been a little bit off color compared to normal according to family they did keep the dressing today that was removed to show me and I agree this seems to be a little bit different compared to what we have been seeing. Coupled with the redness I'm concerned he may be developing some cellulitis surrounding the wound bed. 02/27/18 on evaluation today patient presents for follow-up concerning his sacral ulcer. We have received the results back from his wound culture which shows unfortunately that the doxycycline will not be of benefit for him I am going to need to initiate treatment with something else  in order to treat the pseudomonas. Otherwise he does not seem to be having any  significant pain although his daughter states there are sometimes when he states having pain. We continue to use the Vashe currently. 03/06/18 on evaluation today patient's sacral wound appears to be doing better in my opinion. He has been tolerating the dressing changes without complication. With that being said the silver nitrate has helped with the prominent area of hyper granulation at the 6 o'clock location we will likely need to repeat this again today. Nonetheless overall I am pleased with how things have improved over the last week. The erythema surrounding the wound seems to be greatly improved. 03/13/18 on evaluation today patient's wound actually does not appear to be terribly infected although she does continue to have erythema surrounding the wound bed especially on the left border. I'm still somewhat concerned about the fact that the oral antibiotics alone may not be completely treating his infection. I previously discussed may need to go for IV antibiotic therapy I'm concerned that may be the case. We will need to make a referral today for infectious disease. 03/20/18 on evaluation today the patient sacral wound actually appears to be doing fairly well in regard to granulation although he continues unfortunately to have it your theme is surrounding the periwound region. There's also some increased swelling at the 6 o'clock location which also has me somewhat concerned. With that being said he does have some discomfort but nothing too significant at this point. He still has not heard from infectious disease his daughter and wife are both present during the office visit today they're going to check back with this again. We did get the information for them to call them today. 03/27/18 on evaluation today patient is seen concerning his ongoing sacral ulcer. He has been tolerating the dressing changes without complication. With that being said he does present with evidence of bright green  drainage noted on the dressing which again is something that I do often expect to see with a pseudomonas infection. He continues to have your theme is SHIGERU, LAMPERT ([id number]) surrounding the wound bed as well and again I'm not 100% convinced this is just pressure related. I did speak with Colletta [place] who is the nurse practitioner in [place] with infectious disease. I spoke with her actually yesterday concerning this patient. She is not 100% convinced that this is infected. She question whether the wound may just be colonized with Pseudomonas and not actually causing an active infection. I am really not thinking that the edema is associated with pressure alone and again overall I don't feel that your theme and is consistent with a pressure injury either as he's never had any contusions noted like a deep tissue injury on his heel which was new and I did visualize today this was on the left heel. Nonetheless she wanted to give this a little bit more time and thought it would be appropriate to start the Wound VAC at this point. 04/03/18 on evaluation today patient sacral ulcer actually appears to be doing fairly well at this point. He has been tolerating the dressing changes without complication. With that being said I'm very pleased with the progress that has been made in regard to his sacral wound over the past week I do not see as much in the way of erythema which is great news. Nonetheless he does have a small area of hyper granulation unfortunately. This is at roughly the 7 o'clock location and I  think does need to be addressed so that this will heal more appropriately. Nonetheless I think we may be ready to go ahead and initiate therapy with the Wound VAC. 04/10/18 on evaluation today patient appears to be doing excellent in regard to his sacral ulcer. The show signs of great improvement in overall I'm very pleased with how things look. He has been tolerating the dressing changes  without complication. Specifically this is the Wound VAC. He also seems to be doing well with the antibiotic there is decreased your theme and redness surrounding the sacral area at this point in the wound has filled in quite significantly. 04/17/18 on evaluation today patient actually appears to be doing excellent in regard to his sacral ulcer. He's been tolerating the dressing changes without complication specifically the Wound VAC. There really are no major concerns from the patient nor family this point he is having no pain. He does have a little bit of Epiboly on the lateral portions of the wound where he does have a little bit more depth that will need to be addressed today. 04/23/18 on evaluation today patient's wound actually appears to be doing excellent at this point. He has been tolerating the Wound VAC and this appears to be doing well other than the fact that it seems to be breaking seal at the 6 o'clock location. I do believe that adding a duodenum dressing at this location try and help maintain the seal would be appropriate and likely very effective. With that being said he overall seems to be showing signs of good improvement at this point. There does not appear to be any evidence of significant infection which is also excellent news. 04/30/18 on evaluation today patient actually appears to be doing well in regard to his sacral ulcer. He's been tolerating the dressing changes without complication. Fortunately there does not appear to be any evidence of infection. Overall I'm very pleased with the progress that has been made up to this point. He does have some blistering underneath the draping unfortunately although this is definitely something that has been noted on other patients previously is a fairly common occurrence. Nonetheless the patient seems to be doing fairly well in general in my pinion based on what I see at this time. I do believe these are fairly superficial and  minor. 05/07/18 on evaluation today patient's wound actually appears to be doing excellent at this point in time. He has been tolerating the Wound VAC decently well he states that it is somewhat cumbersome to carry around unfortunately. The only other issue he's been having according to family is that they been having a difficult time keeping the Wound VAC in place and doing what is supposed to do without making. Obviously I do think that this is definitely of concern. Nonetheless I do believe she's made good progress up to this point. I'm very happy in that regard. 05/14/18 on evaluation today patient's wound continues to make good progress at this point. He had a minimal amount of slough noted on the surface which was easily wiped away with saline and gauze and in general I feel like that he is continuing to show excellent progress even with the discontinuation of the Wound VAC. Overall I'm pleased in this regard. He was having issues with the Wound VAC in getting it to seal I think that using the Prisma at this time has been equally efficient and getting the wound to diminish in size. 05/29/18- He is here in follow up  evaluation for a sacral ulcer. There is improvement, we will continue with prisma and he will follow up in two weeks 06/11/18 on evaluation today patient had unfortunately bright green drainage on the dressing upon evaluation today. This is something that we have encountered before although we were able to get things under control previously with antibiotics. With that being said currently upon further inspection of the three areas of hyper granulation that were separate from the actual wound itself which was almost healed it really appears that these all have some depth to them. They are more tunnels that really have not closed or at least have reopened as a result likely of infection in my pinion. This is definitely not what I was expecting or hoping for. LIVIO, LEDWITH  ([id number]) 06/26/18 on evaluation today patient continues to experience issues with what appears to be small abscesses in the sacral region unfortunately. With that being said he has been tolerating the dressing changes without complication which is good news. He's not having any significant discomfort which is also good news. With that being said his daughter states that after he left last week that the packing that we have placed fell out quite rapidly and he subsequently healed over very quick to the point they were not able to even repack the regions. Nonetheless there appear to be several fluctuance areas noted at this point there's one central region that does seem to be draining still discharge that is somewhat green in color. I did review the results of the wound culture which did show evidence of infection with both MRSA as well as pseudomonas based on that result. Nonetheless again with his other current medications we are not able to do the Cipro due to issues with potential long QT syndrome. I am going to give him a prescription for doxycycline in order to help with the potential MRSA infection. 07/09/18 on evaluation today patient actually appears to be doing about the same in regard to his sacral wound. He actually has his MRI scheduled for tomorrow and then subsequently is going to be having his infectious disease appointment for Thursday of this week. Fortunately he's not having any significant discomfort he still has your theme in the sacral region he still has several blister/flux went areas although they technically are not blisters this is more like a underlying abscess. The one area that is open still does probe down to bone. Again I am concerned about a deeper infection possibly even sacral osteomyelitis. Electronic Signature(s) Signed: 07/10/2018 9:18:02 AM By: Worthy Keeler PA-C Entered By: Worthy Keeler on 07/10/2018 09:13:33 Kalev, Temme [name]  ([id number]) -------------------------------------------------------------------------------- Physical Exam Details Patient Name: AIRIK, GOODLIN Date of Service: 07/09/2018 2:45 PM Medical Record Number: [id number] Patient Account Number: [id number] Date of Birth/Sex: [birth year] (84 y.o. M) Treating RN: Roger Shelter Primary Care Provider: Burman Freestone Other Clinician: Referring Provider: Burman Freestone Treating Provider/Extender: STONE III, HOYT Weeks in Treatment: 21 Constitutional Well-nourished and well-hydrated in no acute distress. Respiratory normal breathing without difficulty. clear to auscultation bilaterally. Cardiovascular regular rate and rhythm with normal S1, S2. Psychiatric this patient is able to make decisions and demonstrates good insight into disease process. Alert and Oriented x 3. pleasant and cooperative. Notes On inspection patient's wound does not appear to be doing significantly worse which is good news. With that being said I did not open any of the flux went areas around the one central opening just due to the fact that in the past  when I've done this according to the family the packing strip comes out in the sill back over fairly rapidly. With that being said I still am concerned about a deeper abscess. No debridement performed today. Electronic Signature(s) Signed: 07/10/2018 9:18:02 AM By: Worthy Keeler PA-C Entered By: Worthy Keeler on 07/10/2018 09:14:08 [name] ([id number]) -------------------------------------------------------------------------------- Physician Orders Details Patient Name: COLM, LYFORD Date of Service: 07/09/2018 2:45 PM Medical Record Number: [id number] Patient Account Number: [id number] Date of Birth/Sex: 1931/05/01 (84 y.o. M) Treating RN: Roger Shelter Primary Care Provider: Burman Freestone Other Clinician: Referring Provider: Burman Freestone Treating Provider/Extender: [name], HOYT Weeks in  Treatment: 43 Verbal / Phone Orders: No Diagnosis Coding ICD-10 Coding Code Description L89.154 Pressure ulcer of sacral region, stage 4 L24.0 Irritant contact dermatitis due to detergents Wound Cleansing Wound #1 Sacrum o Clean wound with Normal Saline. Anesthetic (add to Medication List) Wound #1 Sacrum o Topical Lidocaine 4% cream applied to wound bed prior to debridement (In Clinic Only). Skin Barriers/Peri-Wound Care Wound #1 Sacrum o Skin Prep Primary Wound Dressing Wound #1 Sacrum o Iodoform packing Gauze - packing into lower wound area approximately 3cm deep. Secondary Dressing Wound #1 Sacrum o Boardered Foam Dressing - use large border foam to cover entire wound o Drawtex - over the three areas near base of wound due to drainage. Dressing Change Frequency Wound #1 Sacrum o Change Dressing Monday, Wednesday, Friday - HHRN to provide wound care on Wednesday and Friday. Patient to come to clinic on Monday. Follow-up Appointments Wound #1 Sacrum o Return Appointment in 2 weeks. Off-Loading Wound #1 Sacrum o Mattress - hospital bed o Turn and reposition every 2 hours o Other: - please make sure the pt is floating his heels while lying in bed BASTIAN, ANDREOLI. ([id number]) Additional Orders / Instructions Wound #1 Sacrum o Increase protein intake. Home Health Wound #1 [facility] - [facility] to provide wound care on Wednesday and Friday patient comes to clinic on Mondays o Home Health Nurse may visit PRN to address patientos wound care needs. o FACE TO FACE ENCOUNTER: MEDICARE and MEDICAID PATIENTS: I certify that this patient is under my care and that I had a face-to-face encounter that meets the physician face-to-face encounter requirements with this patient on this date. The encounter with the patient was in whole or in part for the following MEDICAL CONDITION: (primary reason for [place]) MEDICAL  NECESSITY: I certify, that based on my findings, NURSING services are a medically necessary home health service. HOME BOUND STATUS: I certify that my clinical findings support that this patient is homebound (i.e., Due to illness or injury, pt requires aid of supportive devices such as crutches, cane, wheelchairs, walkers, the use of special transportation or the assistance of another person to leave their place of residence. There is a normal inability to leave the home and doing so requires considerable and taxing effort. Other absences are for medical reasons / religious services and are infrequent or of short duration when for other reasons). o If current dressing causes regression in wound condition, may D/C ordered dressing product/s and apply Normal Saline Moist Dressing daily until next [place] / Other MD appointment. [place] of regression in wound condition at [phone]. o Please direct any NON-WOUND related issues/requests for orders to patient's Primary Care Physician Electronic Signature(s) Signed: 07/10/2018 9:18:02 AM By: Worthy Keeler PA-C Signed: 07/10/2018 10:33:34 AM By: Rema Fendt  By: Roger Shelter on 07/09/2018 15:37:26 [name] ([id number]) -------------------------------------------------------------------------------- Problem List Details Patient Name: LIRON, EISSLER. Date of Service: 07/09/2018 2:45 PM Medical Record Number: [id number] Patient Account Number: [id number] Date of Birth/Sex: April 06, 1931 (84 y.o. M) Treating RN: Roger Shelter Primary Care Provider: Burman Freestone Other Clinician: Referring Provider: Burman Freestone Treating Provider/Extender: [name], HOYT Weeks in Treatment: 21 Active Problems ICD-10 Evaluated Encounter Code Description Active Date Today Diagnosis L89.154 Pressure ulcer of sacral region, stage 4 02/06/2018 No Yes L24.0 Irritant contact dermatitis due to  detergents 02/06/2018 No Yes Inactive Problems Resolved Problems Electronic Signature(s) Signed: 07/10/2018 9:18:02 AM By: Worthy Keeler PA-C Entered By: Worthy Keeler on 07/09/2018 15:29:03 [name] ([id number]) -------------------------------------------------------------------------------- Progress Note Details Patient Name: [name] Date of Service: 07/09/2018 2:45 PM Medical Record Number: [id number] Patient Account Number: [id number] Date of Birth/Sex: 27-Nov-1930 (84 y.o. M) Treating RN: Roger Shelter Primary Care Provider: Burman Freestone Other Clinician: Referring Provider: Burman Freestone Treating Provider/Extender: [name], HOYT Weeks in Treatment: 21 Subjective Chief Complaint Information obtained from Patient Sacral pressure ulcer History of Present Illness (HPI) 02/06/18 on evaluation today patient presents for initial evaluation and our clinic concerning an issue which began roughly 3 weeks ago when the patient fell in his home on the floor in his kitchen and laid him down this detergent for roughly 3 days. He had a pressure injury to the left shoulder. This unfortunately has caused him a lot of discomfort although it finally seems to be doing better if anything is really having a lot of itching right now. This appears potentially be a contact dermatitis issue. He also has a significant pressure injury to the sacrum at this time as well which is also showing fascia exposure right over the bone but no evidence of bone exposure at this point which is good news. They have been using Santyl as well as Saline soaked gauze at this point in time. There does appear to be a lot of necrotic slough in the base of the wound. He does have a history of incontinence, myocardial infarction, and hypertension. He also is "borderline diabetic" hemoglobin A1c of 6.0. Currently he has some discomfort in the pressure site at the sacrum but fortunately nothing too  significant this did require sharp debridement today. 02/13/18 on evaluation today patient appears to be doing much better in regard to his sacral wound. He has been tolerating the dressing changes without complication with the Vashe. Fortunately there is no evidence of infection and though there is some Slough on the surface of the wound bed he has excellent granulation noted. Overall I'm pleased with how things have progressed in that regard. A glance at his shoulder as well and the rash seems to be someone improving in my pinion at this site as well. Overall I am pleased with what we're seeing and so is the family. 02/20/18 on evaluation today patient appears to be doing a little worse in regard to the sacral wound only in the fact that there is redness surrounding it has me somewhat concerned for infection. The drainage has also apparently been a little bit off color compared to normal according to family they did keep the dressing today that was removed to show me and I agree this seems to be a little bit different compared to what we have been seeing. Coupled with the redness I'm concerned he may be developing some cellulitis surrounding the wound bed. 02/27/18 on evaluation today patient presents  for follow-up concerning his sacral ulcer. We have received the results back from his wound culture which shows unfortunately that the doxycycline will not be of benefit for him I am going to need to initiate treatment with something else in order to treat the pseudomonas. Otherwise he does not seem to be having any significant pain although his daughter states there are sometimes when he states having pain. We continue to use the Vashe currently. 03/06/18 on evaluation today patient's sacral wound appears to be doing better in my opinion. He has been tolerating the dressing changes without complication. With that being said the silver nitrate has helped with the prominent area of hyper granulation at  the 6 o'clock location we will likely need to repeat this again today. Nonetheless overall I am pleased with how things have improved over the last week. The erythema surrounding the wound seems to be greatly improved. 03/13/18 on evaluation today patient's wound actually does not appear to be terribly infected although she does continue to have erythema surrounding the wound bed especially on the left border. I'm still somewhat concerned about the fact that the oral antibiotics alone may not be completely treating his infection. I previously discussed may need to go for IV antibiotic therapy I'm concerned that may be the case. We will need to make a referral today for infectious disease. 03/20/18 on evaluation today the patient sacral wound actually appears to be doing fairly well in regard to granulation although he continues unfortunately to have it your theme is surrounding the periwound region. There's also some increased swelling at the 6 o'clock location which also has me somewhat concerned. With that being said he does have some discomfort but TAJAY, MUZZY. ([id number]) nothing too significant at this point. He still has not heard from infectious disease his daughter and wife are both present during the office visit today they're going to check back with this again. We did get the information for them to call them today. 03/27/18 on evaluation today patient is seen concerning his ongoing sacral ulcer. He has been tolerating the dressing changes without complication. With that being said he does present with evidence of bright green drainage noted on the dressing which again is something that I do often expect to see with a pseudomonas infection. He continues to have your theme is surrounding the wound bed as well and again I'm not 100% convinced this is just pressure related. I did speak with Colletta Maryland who is the nurse practitioner in [place] with infectious disease. I spoke with her  actually yesterday concerning this patient. She is not 100% convinced that this is infected. She question whether the wound may just be colonized with Pseudomonas and not actually causing an active infection. I am really not thinking that the edema is associated with pressure alone and again overall I don't feel that your theme and is consistent with a pressure injury either as he's never had any contusions noted like a deep tissue injury on his heel which was new and I did visualize today this was on the left heel. Nonetheless she wanted to give this a little bit more time and thought it would be appropriate to start the Wound VAC at this point. 04/03/18 on evaluation today patient sacral ulcer actually appears to be doing fairly well at this point. He has been tolerating the dressing changes without complication. With that being said I'm very pleased with the progress that has been made in regard to his sacral wound  over the past week I do not see as much in the way of erythema which is great news. Nonetheless he does have a small area of hyper granulation unfortunately. This is at roughly the 7 o'clock location and I think does need to be addressed so that this will heal more appropriately. Nonetheless I think we may be ready to go ahead and initiate therapy with the Wound VAC. 04/10/18 on evaluation today patient appears to be doing excellent in regard to his sacral ulcer. The show signs of great improvement in overall I'm very pleased with how things look. He has been tolerating the dressing changes without complication. Specifically this is the Wound VAC. He also seems to be doing well with the antibiotic there is decreased your theme and redness surrounding the sacral area at this point in the wound has filled in quite significantly. 04/17/18 on evaluation today patient actually appears to be doing excellent in regard to his sacral ulcer. He's been tolerating the dressing changes without  complication specifically the Wound VAC. There really are no major concerns from the patient nor family this point he is having no pain. He does have a little bit of Epiboly on the lateral portions of the wound where he does have a little bit more depth that will need to be addressed today. 04/23/18 on evaluation today patient's wound actually appears to be doing excellent at this point. He has been tolerating the Wound VAC and this appears to be doing well other than the fact that it seems to be breaking seal at the 6 o'clock location. I do believe that adding a duodenum dressing at this location try and help maintain the seal would be appropriate and likely very effective. With that being said he overall seems to be showing signs of good improvement at this point. There does not appear to be any evidence of significant infection which is also excellent news. 04/30/18 on evaluation today patient actually appears to be doing well in regard to his sacral ulcer. He's been tolerating the dressing changes without complication. Fortunately there does not appear to be any evidence of infection. Overall I'm very pleased with the progress that has been made up to this point. He does have some blistering underneath the draping unfortunately although this is definitely something that has been noted on other patients previously is a fairly common occurrence. Nonetheless the patient seems to be doing fairly well in general in my pinion based on what I see at this time. I do believe these are fairly superficial and minor. 05/07/18 on evaluation today patient's wound actually appears to be doing excellent at this point in time. He has been tolerating the Wound VAC decently well he states that it is somewhat cumbersome to carry around unfortunately. The only other issue he's been having according to family is that they been having a difficult time keeping the Wound VAC in place and doing what is supposed to do without  making. Obviously I do think that this is definitely of concern. Nonetheless I do believe she's made good progress up to this point. I'm very happy in that regard. 05/14/18 on evaluation today patient's wound continues to make good progress at this point. He had a minimal amount of slough noted on the surface which was easily wiped away with saline and gauze and in general I feel like that he is continuing to show excellent progress even with the discontinuation of the Wound VAC. Overall I'm pleased in this regard. He  was having issues with the Wound VAC in getting it to seal I think that using the Prisma at this time has been equally efficient and getting the wound to diminish in size. 05/29/18- He is here in follow up evaluation for a sacral ulcer. There is improvement, we will continue with prisma and he will follow up in two weeks ROBET, CRUTCHFIELD ([id number]) 06/11/18 on evaluation today patient had unfortunately bright green drainage on the dressing upon evaluation today. This is something that we have encountered before although we were able to get things under control previously with antibiotics. With that being said currently upon further inspection of the three areas of hyper granulation that were separate from the actual wound itself which was almost healed it really appears that these all have some depth to them. They are more tunnels that really have not closed or at least have reopened as a result likely of infection in my pinion. This is definitely not what I was expecting or hoping for. 06/26/18 on evaluation today patient continues to experience issues with what appears to be small abscesses in the sacral region unfortunately. With that being said he has been tolerating the dressing changes without complication which is good news. He's not having any significant discomfort which is also good news. With that being said his daughter states that after he left last week that the packing  that we have placed fell out quite rapidly and he subsequently healed over very quick to the point they were not able to even repack the regions. Nonetheless there appear to be several fluctuance areas noted at this point there's one central region that does seem to be draining still discharge that is somewhat green in color. I did review the results of the wound culture which did show evidence of infection with both MRSA as well as pseudomonas based on that result. Nonetheless again with his other current medications we are not able to do the Cipro due to issues with potential long QT syndrome. I am going to give him a prescription for doxycycline in order to help with the potential MRSA infection. 07/09/18 on evaluation today patient actually appears to be doing about the same in regard to his sacral wound. He actually has his MRI scheduled for tomorrow and then subsequently is going to be having his infectious disease appointment for Thursday of this week. Fortunately he's not having any significant discomfort he still has your theme in the sacral region he still has several blister/flux went areas although they technically are not blisters this is more like a underlying abscess. The one area that is open still does probe down to bone. Again I am concerned about a deeper infection possibly even sacral osteomyelitis. Patient History Information obtained from Patient. Family History Cancer - Paternal Grandparents, Diabetes - Father, Heart Disease - Mother,Father, Stroke - Father, No family history of Hypertension, Kidney Disease, Lung Disease, Seizures, Thyroid Problems, Tuberculosis. Social History Never smoker, Marital Status - Widowed, Alcohol Use - Never, Drug Use - No History, Caffeine Use - Daily. Medical History Hospitalization/Surgery History - 01/20/2018, [facility]. Medical And Surgical History Notes Endocrine Borderline Oncologic Melanoma on back Review of Systems  (ROS) Constitutional Symptoms (General Health) Denies complaints or symptoms of Fever, Chills. Respiratory The patient has no complaints or symptoms. Cardiovascular The patient has no complaints or symptoms. Psychiatric The patient has no complaints or symptoms. RIGGS, DINEEN ([id number]) Objective Constitutional Well-nourished and well-hydrated in no acute distress. Vitals Time Taken: 3:10  PM, Height: 69 in, Weight: 155 lbs, BMI: 22.9, Temperature: 98.2 F, Pulse: 55 bpm, Respiratory Rate: 18 breaths/min, Blood Pressure: 148/65 mmHg. Respiratory normal breathing without difficulty. clear to auscultation bilaterally. Cardiovascular regular rate and rhythm with normal S1, S2. Psychiatric this patient is able to make decisions and demonstrates good insight into disease process. Alert and Oriented x 3. pleasant and cooperative. General Notes: On inspection patient's wound does not appear to be doing significantly worse which is good news. With that being said I did not open any of the flux went areas around the one central opening just due to the fact that in the past when I've done this according to the family the packing strip comes out in the sill back over fairly rapidly. With that being said I still am concerned about a deeper abscess. No debridement performed today. Integumentary (Hair, Skin) Wound #1 status is Open. Original cause of wound was Pressure Injury. The wound is located on the Sacrum. The wound measures 0.2cm length x 0.2cm width x 1.2cm depth; 0.031cm^2 area and 0.038cm^3 volume. There is Fat Layer (Subcutaneous Tissue) Exposed exposed. There is no tunneling or undermining noted. There is a small amount of purulent drainage noted. The wound margin is distinct with the outline attached to the wound base. There is no granulation within the wound bed. There is a small (1-33%) amount of necrotic tissue within the wound bed including Eschar. The periwound  skin appearance exhibited: Excoriation, Erythema. The periwound skin appearance did not exhibit: Callus, Crepitus, Induration, Rash, Scarring, Dry/Scaly, Maceration, Atrophie Blanche, Cyanosis, Ecchymosis, Hemosiderin Staining, Mottled, Pallor, Rubor. The surrounding wound skin color is noted with erythema which is circumferential. Periwound temperature was noted as No Abnormality. The periwound has tenderness on palpation. Assessment Active Problems ICD-10 Pressure ulcer of sacral region, stage 4 Irritant contact dermatitis due to detergents Plan Wound Cleansing: MYCAL, CONDE ([id number]) Wound #1 Sacrum: Clean wound with Normal Saline. Anesthetic (add to Medication List): Wound #1 Sacrum: Topical Lidocaine 4% cream applied to wound bed prior to debridement (In Clinic Only). Skin Barriers/Peri-Wound Care: Wound #1 Sacrum: Skin Prep Primary Wound Dressing: Wound #1 Sacrum: Iodoform packing Gauze - packing into lower wound area approximately 3cm deep. Secondary Dressing: Wound #1 Sacrum: Boardered Foam Dressing - use large border foam to cover entire wound Drawtex - over the three areas near base of wound due to drainage. Dressing Change Frequency: Wound #1 Sacrum: Change Dressing Monday, Wednesday, Friday - HHRN to provide wound care on Wednesday and Friday. Patient to come to clinic on Monday. Follow-up Appointments: Wound #1 Sacrum: Return Appointment in 2 weeks. Off-Loading: Wound #1 Sacrum: Mattress - hospital bed Turn and reposition every 2 hours Other: - please make sure the pt is floating his heels while lying in bed Additional Orders / Instructions: Wound #1 Sacrum: Increase protein intake. Home Health: Wound #1 Sacrum: Continue Home Health Visits - Jackquline [facility]- [facility] to provide wound care on Wednesday and Friday patient comes to clinic on Mondays Home Health Nurse may visit PRN to address patient s wound care needs. FACE TO FACE ENCOUNTER: MEDICARE and  MEDICAID PATIENTS: I certify that this patient is under my care and that I had a face-to-face encounter that meets the physician face-to-face encounter requirements with this patient on this date. The encounter with the patient was in whole or in part for the following MEDICAL CONDITION: (primary reason for [place]) MEDICAL NECESSITY: I certify, that based on my findings, NURSING services are a medically necessary  home health service. HOME BOUND STATUS: I certify that my clinical findings support that this patient is homebound (i.e., Due to illness or injury, pt requires aid of supportive devices such as crutches, cane, wheelchairs, walkers, the use of special transportation or the assistance of another person to leave their place of residence. There is a normal inability to leave the home and doing so requires considerable and taxing effort. Other absences are for medical reasons / religious services and are infrequent or of short duration when for other reasons). If current dressing causes regression in wound condition, may D/C ordered dressing product/s and apply Normal Saline Moist Dressing daily until next [place] / Other MD appointment. [place] of regression in wound condition at [phone]. Please direct any NON-WOUND related issues/requests for orders to patient's Primary Care Physician I'm gonna suggest currently that we continue with the above wound care measures for the next week. Patient is in agreement with the plan. We will subsequently see were things stand at follow-up. I am glad that he will be seeing infectious disease this week I think that is definitely necessary. Please see above for specific wound care orders. We will see patient for re-evaluation in 1 week(s) here in the clinic. If anything worsens or changes patient will contact our office for additional recommendations. TERRIE, GRAJALES ([id number]) Electronic  Signature(s) Signed: 07/10/2018 9:18:02 AM By: Worthy Keeler PA-C Entered By: Worthy Keeler on 07/10/2018 09:14:59 Laren, [place] [name] ([id number]) -------------------------------------------------------------------------------- ROS/PFSH Details Patient Name: [name] Date of Service: 07/09/2018 2:45 PM Medical Record Number: [id number] Patient Account Number: [id number] Date of Birth/Sex: [birth year] (85 y.o. M) Treating RN: Roger Shelter Primary Care Provider: Burman Freestone Other Clinician: Referring Provider: Burman Freestone Treating Provider/Extender: [name], HOYT Weeks in Treatment: 21 Information Obtained From Patient Wound History Do you currently have one or more open woundso Yes How many open wounds do you currently haveo 2 Approximately how long have you had your woundso 3 weeks Has your wound(s) ever healed and then re-openedo Yes Have you had any lab work done in the past montho Yes Have you tested positive for osteomyelitis (bone infection)o No Have you had any tests for circulation on your legso No Constitutional Symptoms (General Health) Complaints and Symptoms: Negative for: Fever; Chills Eyes Medical History: Positive for: Cataracts - bilateral removal Negative for: Glaucoma; Optic Neuritis Ear/Nose/Mouth/Throat Medical History: Negative for: Chronic sinus problems/congestion; Middle ear problems Hematologic/Lymphatic Medical History: Negative for: Anemia; Hemophilia; Human Immunodeficiency Virus; Lymphedema; Sickle Cell Disease Respiratory Complaints and Symptoms: No Complaints or Symptoms Medical History: Positive for: Asthma Negative for: Aspiration; Chronic Obstructive Pulmonary Disease (COPD); Pneumothorax; Sleep Apnea; Tuberculosis Cardiovascular Complaints and Symptoms: No Complaints or Symptoms Medical History: Positive for: Angina; Arrhythmia; Coronary Artery Disease; Hypertension; Myocardial Infarction - 2001 Negative for:  Congestive Heart Failure; Deep Vein Thrombosis; Hypotension; Peripheral Arterial Disease; Peripheral EYDAN, CHIANESE ([id number]) Venous Disease; Phlebitis; Vasculitis Gastrointestinal Medical History: Negative for: Cirrhosis ; Colitis; Crohnos; Hepatitis A; Hepatitis B; Hepatitis C Endocrine Medical History: Negative for: Type I Diabetes; Type II Diabetes Past Medical History Notes: Borderline Genitourinary Medical History: Negative for: End Stage Renal Disease Immunological Medical History: Negative for: Lupus Erythematosus; Raynaudos; Scleroderma Integumentary (Skin) Medical History: Negative for: History of Burn; History of pressure wounds Musculoskeletal Medical History: Positive for: Osteoarthritis Negative for: Gout; Rheumatoid Arthritis; Osteomyelitis Neurologic Medical History: Positive for: Dementia; Neuropathy Negative for: Quadriplegia; Paraplegia; Seizure Disorder Oncologic Medical History: Negative for: Received Chemotherapy; Received Radiation  Past Medical History Notes: Melanoma on back Psychiatric Complaints and Symptoms: No Complaints or Symptoms Medical History: Negative for: Anorexia/bulimia; Confinement Anxiety HBO Extended History Items Eyes: Cataracts SHO, SALGUERO ([id number]) Immunizations Pneumococcal Vaccine: Received Pneumococcal Vaccination: Yes Implantable Devices Hospitalization / Surgery History Name of Hospital Purpose of Hospitalization/Surgery Date [facility] 01/20/2018 Family and Social History Cancer: Yes - Paternal Grandparents; Diabetes: Yes - Father; Heart Disease: Yes - Mother,Father; Hypertension: No; Kidney Disease: No; Lung Disease: No; Seizures: No; Stroke: Yes - Father; Thyroid Problems: No; Tuberculosis: No; Never smoker; Marital Status - Widowed; Alcohol Use: Never; Drug Use: No History; Caffeine Use: Daily; Financial Concerns: No; Food, Clothing or Shelter Needs: No; Support System Lacking: No; Transportation  Concerns: No; Advanced Directives: Yes (Not Provided); Patient does not want information on Advanced Directives; Do not resuscitate: Yes (Not Provided); Living Will: Yes (Not Provided); Medical Power of Attorney: Yes - Maclain Cohron (Not Provided) Physician Affirmation I have reviewed and agree with the above information. Electronic Signature(s) Signed: 07/10/2018 9:18:02 AM By: Worthy Keeler PA-C Signed: 07/10/2018 10:33:34 AM By: Roger Shelter Entered By: Worthy Keeler on 07/10/2018 09:13:50 Sadao, Weyer [name] ([id number]) -------------------------------------------------------------------------------- SuperBill Details Patient Name: [name] Date of Service: 07/09/2018 Medical Record Number: [id number] Patient Account Number: [id number] Date of Birth/Sex: 08-26-1931 (84 y.o. M) Treating RN: Roger Shelter Primary Care Provider: Burman Freestone Other Clinician: Referring Provider: Burman Freestone Treating Provider/Extender: [name], HOYT Weeks in Treatment: 21 Diagnosis Coding ICD-10 Codes Code Description L89.154 Pressure ulcer of sacral region, stage 4 L24.0 Irritant contact dermatitis due to detergents Physician Procedures CPT4 Code: 4585929 Description: 24462 - WC PHYS LEVEL 3 - EST PT ICD-10 Diagnosis Description L89.154 Pressure ulcer of sacral region, stage 4 Modifier: Quantity: 1 Electronic Signature(s) Signed: 07/10/2018 10:33:10 AM By: Roger Shelter Signed: 07/10/2018 5:36:35 PM By: Worthy Keeler PA-C Previous Signature: 07/10/2018 9:18:02 AM Version By: Worthy Keeler PA-C Entered By: Roger Shelter on 07/10/2018 10:33:10

## 2018-07-12 ENCOUNTER — Ambulatory Visit (INDEPENDENT_AMBULATORY_CARE_PROVIDER_SITE_OTHER): Payer: Medicare Other | Admitting: Infectious Diseases

## 2018-07-12 ENCOUNTER — Encounter: Payer: Self-pay | Admitting: Infectious Diseases

## 2018-07-12 VITALS — BP 113/67 | HR 54 | Temp 98.4°F | Ht 70.0 in | Wt 171.0 lb

## 2018-07-12 DIAGNOSIS — Z23 Encounter for immunization: Secondary | ICD-10-CM

## 2018-07-12 DIAGNOSIS — Z5181 Encounter for therapeutic drug level monitoring: Secondary | ICD-10-CM

## 2018-07-12 DIAGNOSIS — Z452 Encounter for adjustment and management of vascular access device: Secondary | ICD-10-CM | POA: Diagnosis not present

## 2018-07-12 DIAGNOSIS — L89154 Pressure ulcer of sacral region, stage 4: Secondary | ICD-10-CM

## 2018-07-12 NOTE — Assessment & Plan Note (Signed)
Draining sinus tract with communication to sacrum and MRI c/w osteomyelitis. Will place picc line and target Pseudomonas and MRSA with ceftazidime and daptomycin (would prefer to keep him off vancomycin with his age and other medications that increase risk for nephrotoxic effect). We discussed even consideration of oral doxycycline to treat MRSA as an alternative. He has a sulfa allergy that prohibits use of bactrim. He will continue with meticulous wound care, proper nutrition/diet intake and offloading of the wound.    OPAT ORDERS:  Diagnosis: Sacral osteomyelitis   Culture Result: Pseudomonas and MRSA   Allergies  Allergen Reactions  . Budesonide-Formoterol Fumarate Swelling    Mouth and tongue swelling.  . Iohexol Shortness Of Breath and Itching    Pt was given 100cc of Omnipaque 300 followed by itching/ dyspnea.  . Simvastatin Other (See Comments)    REACTION: unknown  . Demerol [Meperidine] Other (See Comments)    REACTION: Hallucinations  . Ivp Dye [Iodinated Diagnostic Agents] Other (See Comments)    Iodine pt had a reaction when he had a CT DONE  . Meperidine Hcl Other (See Comments)    REACTION: Hallucinations  . Tape Other (See Comments)    Tears skin off, Please use "paper" tape  . Naproxen Swelling  . Pregabalin Rash  . Sulfonamide Derivatives Rash    Discharge antibiotics: Ceftazidime 2 gm IV Q12h   + Daptomycin 61m/kg Q24h (6566minjection)   Duration: 8 weeks   End Date: 09-13-18  [facility] Per Protocol Please use biopatch for routine care _x_ Please pull PIC at completion of IV antibiotics  Labs weekly while on IV antibiotics: _x_ CBC with differential _x_ CMP  _x_ CK _x_ CRP & ESR every 2 weeks please   Fax weekly labs to [phone] Follow Up Appt: StColletta Marylandn 6 weeks to evaluate early stop/oral therapy

## 2018-07-12 NOTE — Assessment & Plan Note (Signed)
Arrange for PICC placement next Thursday at [facility] per family's request. Will also arrange short stay admission for first dose antibiotics once insurance approves regimen.

## 2018-07-12 NOTE — Patient Instructions (Addendum)
Wonderful to see you all again today.   Will arrange PICC line placement with [facility] next week.   Will plan on starting 2 antibiotics through your IV to treat this infection. The plan is for 6-8 weeks of treatment with continued efforts for wound care as you are currently doing with Margarita Grizzle and his team.   Will have home health agency work on insurance approval for antibiotics and will be in touch about the choice.   The plan will be to have you administer first doses of antibiotics at the hospital after your insertion next week.   Please return to see [name] again in 7 weeks.    PICC Insertion A peripherally inserted central catheter (PICC) is a long, thin, flexible tube that is inserted into a vein in the upper arm. It is a form of IV access. It is considered to be a "central" line because the tip of the PICC ends in a large vein in your chest. This large vein is called the superior vena cava (SVC). The PICC tip ends in the SVC because there is a lot of blood flow in the SVC. This allows medicines and IV fluids to be quickly distributed throughout the body. The PICC is inserted using a sterile technique by a specially trained health care provider. After the PICC is inserted, a chest X-ray may be done to ensure that it is in the correct place. Tell a health care provider about:  Any allergies you have.  All medicines you are taking, including vitamins, herbs, eye drops, creams, and over-the-counter medicines.  Any problems you or family members have had with anesthetic medicines.  Any blood disorders you have.  Any surgeries you have had.  Any medical conditions you have. What are the risks? Generally, this is a safe procedure. However, as with any procedure, complications can occur. Possible complications include:  Infection at the insertion site or in the blood.  Bleeding at the insertion site or internally.  Injury or collapse of the lung.  Movement or  malposition of the PICC.  Inflammation of the vein (phlebitis).  Nerve injury or irritation.  Clot formation at the tip of the PICC line.  Blood clot in the lung (pulmonary embolus).  Injury to the large blood vessels or heart (rare).  What happens before the procedure?  Your health care provider may want you to have blood tests. These tests can help tell how well your blood clots.  If you take blood thinners (anticoagulant medicine), ask your health care provider if you should stop taking them. What happens during the procedure?  You will have to lie flat for about 30-45 minutes for this procedure.  Your health care provider will start by identifying a vein into which the PICC line will be placed. This is done using ultrasound or X-ray guidance.  Medicine is used to numb the skin around the insertion site.  The skin where the catheter will be inserted is cleaned and covered with a sterile surgical drape.  A small needle is inserted into the vein, and then a small guidewire is advanced into the superior vena cava.  The catheter is then advanced over the guidewire and moved into position. The guidewire is then removed.  The catheter is flushed and blood is drawn back to confirm it is in the vein.  If this is done without X-ray guidance, an X-ray will be needed to make sure the catheter tip is in the correct position.  Once the  placement is confirmed, the PICC is secured to the skin with a device and covered with a sterile dressing. What happens after the procedure?  You should avoid any strenuous activity for the next 2 days or as directed by your health care provider.  You will be allowed to go back to your regular activities after the procedure.  You will be instructed on the care of your PICC line. This information is not intended to replace advice given to you by your health care provider. Make sure you discuss any questions you have with your health care  provider. Document Released: 07/24/2013 Document Revised: 03/10/2016 Document Reviewed: 07/26/2013 Elsevier Interactive Patient Education  2017 [employer] A peripherally inserted central catheter (PICC) is a long, thin, flexible tube that is inserted into a vein in the upper arm. It is a form of intravenous (IV) access. It is considered to be a "central" line because the tip of the PICC ends in a large vein in your chest. This large vein is called the superior vena cava (SVC). The PICC tip ends in the SVC because there is a lot of blood flow in the SVC. This allows medicines and IV fluids to be quickly distributed throughout the body. The PICC is inserted using a sterile technique by a specially trained nurse or physician. After the PICC is inserted, a chest X-ray exam is done to be sure it is in the correct place. A PICC may be placed for different reasons, such as:  To give medicines and liquid nutrition that can only be given through a central line. Examples are: ? Certain antibiotic treatments. ? Chemotherapy. ? Total parenteral nutrition (TPN).  To take frequent blood samples.  To give IV fluids and blood products.  If there is difficulty placing a peripheral intravenous (PIV) catheter.  If taken care of properly, a PICC can remain in place for several months. A PICC can also allow a person to go home from the hospital early. Medicine and PICC care can be managed at home by a family member or home health care team. What problems can happen when I have a PICC? Problems with a PICC can occasionally occur. These may include the following:  A blood clot (thrombus) forming in or at the tip of the PICC. This can cause the PICC to become clogged. A clot-dissolving medicine called tissue plasminogen activator (tPA) can be given through the PICC to help break up the clot.  Inflammation of the vein (phlebitis) in which the PICC is placed. Signs of inflammation may include  redness, pain at the insertion site, red streaks, or being able to feel a "cord" in the vein where the PICC is located.  Infection in the PICC or at the insertion site. Signs of infection may include fever, chills, redness, swelling, or pus drainage from the PICC insertion site.  PICC movement (malposition). The PICC tip may move from its original position due to excessive physical activity, forceful coughing, sneezing, or vomiting.  A break or cut in the PICC. It is important to not use scissors near the PICC.  Nerve or tendon irritation or injury during PICC insertion.  What should I keep in mind about activities when I have a PICC?  You may bend your arm and move it freely. If your PICC is near or at the bend of your elbow, avoid activity with repeated motion at the elbow.  Rest at home for the remainder of the day following PICC  line insertion.  Avoid lifting heavy objects as instructed by your health care provider.  Avoid using a crutch with the arm on the same side as your PICC. You may need to use a walker. What should I know about my PICC dressing?  Keep your PICC bandage (dressing) clean and dry to prevent infection. ? Ask your health care provider when you may shower. Ask your health care provider to teach you how to wrap the PICC when you do take a shower.  Change the PICC dressing as instructed by your health care provider.  Change your PICC dressing if it becomes loose or wet. What should I know about PICC care?  Check the PICC insertion site daily for leakage, redness, swelling, or pain.  Do not take a bath, swim, or use hot tubs when you have a PICC. Cover PICC line with clear plastic wrap and tape to keep it dry while showering.  Flush the PICC as directed by your health care provider. Let your health care provider know right away if the PICC is difficult to flush or does not flush. Do not use force to flush the PICC.  Do not use a syringe that is less than 10 mL to  flush the PICC.  Never pull or tug on the PICC.  Avoid blood pressure checks on the arm with the PICC.  Keep your PICC identification card with you at all times.  Do not take the PICC out yourself. Only a trained clinical professional should remove the PICC. Get help right away if:  Your PICC is accidentally pulled all the way out. If this happens, cover the insertion site with a bandage or gauze dressing. Do not throw the PICC away. Your health care provider will need to inspect it.  Your PICC was tugged or pulled and has partially come out. Do not  push the PICC back in.  There is any type of drainage, redness, or swelling where the PICC enters the skin.  You cannot flush the PICC, it is difficult to flush, or the PICC leaks around the insertion site when it is flushed.  You hear a "flushing" sound when the PICC is flushed.  You have pain, discomfort, or numbness in your arm, shoulder, or jaw on the same side as the PICC.  You feel your heart "racing" or skipping beats.  You notice a hole or tear in the PICC.  You develop chills or a fever. This information is not intended to replace advice given to you by your health care provider. Make sure you discuss any questions you have with your health care provider. Document Released: 04/09/2003 Document Revised: 04/22/2016 Document Reviewed: 07/26/2013 Elsevier Interactive Patient Education  2017 [employer].

## 2018-07-12 NOTE — Progress Notes (Signed)
   Patient: [name]  DOB: 05/22/1931 MRN: [id number] PCP: Harris, Meredith L, FNP  Referring Provider: [facility]   Patient Active Problem List   Diagnosis Date Noted  . Needs peripherally inserted central catheter (PICC) 07/12/2018  . Medication monitoring encounter 03/22/2018  . Malnutrition of moderate degree 01/22/2018  . Sacral decubitus ulcer, stage IV, healing  01/22/2018  . Atrial fibrillation (HCC) 01/21/2018  . Unresponsive episode 01/20/2018  . Elevated troponin 01/20/2018  . Pre-syncope 07/31/2017  . B12 deficiency 06/19/2017  . Essential hypertension   . Bladder outlet obstruction   . Generalized weakness 03/03/2014  . ESOPHAGEAL STRICTURE 09/20/2010  . ORTHOSTATIC DIZZINESS 08/30/2010  . IBS 08/26/2010  . RECTAL BLEEDING 08/26/2010  . RECTAL PAIN 08/26/2010  . ARTHRITIS 08/26/2010  . LOSS OF APPETITE 08/26/2010  . OTHER DYSPHAGIA 08/26/2010  . COLONIC POLYPS, HX OF 08/26/2010  . PALPITATIONS, HX OF 08/18/2010  . PERIPHERAL NEUROPATHY 05/01/2009  . History of myocardial infarction 05/01/2009  . ALLERGIC RHINITIS, SEASONAL 05/01/2009  . DEPRESSION, HX OF 05/01/2009  . Personal history of other diseases of digestive system 05/01/2009  . NEPHROLITHIASIS, HX OF 05/01/2009  . BENIGN PROSTATIC HYPERTROPHY, HX OF 05/01/2009  . LAMINECTOMY, HX OF 05/01/2009  . Hyperlipidemia 10/20/2008  . CAD (coronary artery disease) 10/20/2008  . GERD 10/20/2008     Subjective:  [name] is a 84 y.o. male here for a second evaluation of his healing stage 4 sacral ulcer. I last saw him and his daughters 3 months ago when pseudomonas was cultured from wound. It did not at the time appear clinically infected and recommended to proceed with wound VAC. Since that time he has had significant improvement and near closure of the ulcer with exception of small area of what was thought to be tissue - this has since opened up revealing tracking  sinus to sacrum. Culture revealed MRSA (S-bactrim, doxy) and Pseudomonas (pansens).   He is overall feeling quite well and happy his wound is no longer deep enough to need wound vac. He had an MRI a short time ago at my request revealing underlying complex fluid collection superficial to the distal sacrum measuring 5.9 x 4.4 x 2.8 cm that abuts the posterior aspect of the distal sacrum; T2 marrow hyperintensity c/w osteomyelitis. He does not have any significant pain and is able to walk around and perform ADLs with his daughter's assistance. He denies any fevers/chills. The wound has been debrided a bit and now requires daily packing - daughter reports small greenish drainage with each change.   Medications/allergies reviewed and updated.   Review of Systems  All other systems reviewed and are negative.   Past Medical History:  Diagnosis Date  . Arthritis    "hips, back" (06/17/2015)  . Asthma   . Atrial fibrillation (HCC) 01/21/2018  . Chronic chest pain   . Chronic lower back pain   . Coronary artery disease    a. s/p BMS to RCA in 2002; b. s/p cutting balloon POBA ;   c. cath 6/12: oDx 80% (treated with repeat cutting balloon POBA), mLAD 50% with 30-40% at Dx, CFX 30%, pRCA 25% with patent stents;  d.  Lex MV 4/14:  Low Risk - EF 61%, inf scar with peri-infarct ischemia  . Dyspnea    chronic  . Essential hypertension   . GERD (gastroesophageal reflux disease)    h/o esophageal spasm  . GI bleed 03/03/2014  . Headache   .   History of blood transfusion    "related to OR"  . History of hiatal hernia   . Hyperlipidemia   . Hypertension   . Melanoma of lower back ([place]) late 1990's  . Memory loss   . Myocardial infarction ([place]) 2001   x 1, confirned 1 possible  . Pre-syncope 07/31/2017    Outpatient Medications Prior to Visit  Medication Sig Dispense Refill  . acetaminophen (TYLENOL) 325 MG tablet Take 2 tablets (650 mg total) by mouth every 4 (four) hours as needed for headache or  mild pain.    Marland Kitchen amiodarone (PACERONE) 200 MG tablet Take 1 tablet (200 mg total) by mouth daily. 90 tablet 3  . amLODipine (NORVASC) 2.5 MG tablet Take 1 tablet (2.5 mg total) by mouth daily. 30 tablet 6  . aspirin EC 81 MG tablet Take 1 tablet (81 mg total) by mouth daily.    Marland Kitchen atorvastatin (LIPITOR) 40 MG tablet Take 1 tablet by mouth daily.   10  . cyclobenzaprine (FLEXERIL) 5 MG tablet Take 7.5 mg by mouth 3 (three) times daily as needed for muscle spasms.     Marland Kitchen doxazosin (CARDURA) 1 MG tablet TAKE 1 TABLET BY MOUTH ONCE DAILY. 15 tablet 0  . feeding supplement, ENSURE ENLIVE, (ENSURE ENLIVE) LIQD Take 237 mLs by mouth 2 (two) times daily between meals. 237 mL 12  . feeding supplement, ENSURE ENLIVE, (ENSURE ENLIVE) LIQD Take 237 mLs by mouth 2 (two) times daily between meals. 237 mL 12  . HYDROcodone-acetaminophen (NORCO) 7.5-325 MG tablet Take 1 tablet by mouth every 4 (four) hours as needed for moderate pain. FOR PAIN 20 tablet 0  . pantoprazole (PROTONIX) 40 MG tablet TAKE 1 TABLET BY MOUTH TWICE DAILY 30 tablet 0  . tamsulosin (FLOMAX) 0.4 MG CAPS capsule Take 0.4 mg by mouth daily.     . vitamin B-12 1000 MCG tablet Take 1 tablet (1,000 mcg total) by mouth daily. 30 tablet 0  . atorvastatin (LIPITOR) 80 MG tablet Take 1 tablet (80 mg total) by mouth daily. 90 tablet 3   No facility-administered medications prior to visit.      Allergies  Allergen Reactions  . Budesonide-Formoterol Fumarate Swelling    Mouth and tongue swelling.  . Iohexol Shortness Of Breath and Itching    Pt was given 100cc of Omnipaque 300 followed by itching/ dyspnea.  . Simvastatin Other (See Comments)    REACTION: unknown  . Demerol [Meperidine] Other (See Comments)    REACTION: Hallucinations  . Ivp Dye [Iodinated Diagnostic Agents] Other (See Comments)    Iodine pt had a reaction when he had a CT DONE  . Meperidine Hcl Other (See Comments)    REACTION: Hallucinations  . Tape Other (See Comments)     Tears skin off, Please use "paper" tape  . Naproxen Swelling  . Pregabalin Rash  . Sulfonamide Derivatives Rash    Social History   Tobacco Use  . Smoking status: Never Smoker  . Smokeless tobacco: Never Used  Substance Use Topics  . Alcohol use: No  . Drug use: No    Family History  Problem Relation Age of Onset  . Diabetes Father   . Heart disease Father   . Asthma Father   . Heart disease Mother        CABG hx age 48  . Colon cancer Son        hx   . Colitis Son        hx  .  Crohn's disease Son   . Prostate cancer Paternal Grandfather     Objective:   Vitals:   07/12/18 1101  BP: 113/67  Pulse: (!) 54  Temp: 98.4 F (36.9 C)  Weight: 171 lb (77.6 kg)  Height: 5' 10" (1.778 m)   Body mass index is 24.54 kg/m.  Physical Exam  Constitutional: He is oriented to person, place, and time. He appears well-developed and well-nourished.  Seated on the table. Shaky. Daughters present.   HENT:  Mouth/Throat: Oropharynx is clear and moist and mucous membranes are normal. Normal dentition. No dental abscesses.  Cardiovascular: Normal rate, regular rhythm and normal heart sounds.  Pulmonary/Chest: Effort normal and breath sounds normal.  Abdominal: Soft. He exhibits no distension. There is no tenderness.  Musculoskeletal: Edema: trace ankle edema.  Lymphadenopathy:    He has no cervical adenopathy.  Neurological: He is alert and oriented to person, place, and time.  Skin: Skin is warm and dry. No rash noted.  Daughter brought picture of sacral wound today - appears to have some erythema around wound margins but overall healed in nicely since last time I saw him with exception to a draining sinus tract. Yellow scab in the center of the wound bed.   Psychiatric: He has a normal mood and affect. Judgment normal.  In good spirits today and engaged in care discussion.     Lab Results: Lab Results  Component Value Date   WBC 7.5 06/04/2018   HGB 9.9 (L) 06/04/2018    HCT 30.5 (L) 06/04/2018   MCV 84 06/04/2018   PLT 309 06/04/2018    Lab Results  Component Value Date   CREATININE 1.07 06/04/2018   BUN 14 06/04/2018   NA 141 06/04/2018   K 4.6 06/04/2018   CL 102 06/04/2018   CO2 26 06/04/2018    Lab Results  Component Value Date   ALT 21 04/12/2018   AST 22 04/12/2018   ALKPHOS 95 04/12/2018   BILITOT 0.3 04/12/2018     Assessment & Plan:   Problem List Items Addressed This Visit      Unprioritized   Medication monitoring encounter    Recent kidney function reviewed - Creat Clearance calculated @ 16m/min. Will obtain inflammatory markers with first lab draw and at follow up with me in 7 weeks.       Needs peripherally inserted central catheter (PICC) - Primary    [facility]. Will also arrange short stay admission for first dose antibiotics once insurance approves regimen.       Relevant Orders   IRPICC PLACEMENT LEFT >5 YRS INC IMG GUIDE   Sacral decubitus ulcer, stage IV, healing     Draining sinus tract with communication to sacrum and MRI c/w osteomyelitis. Will place picc line and target Pseudomonas and MRSA with ceftazidime and daptomycin (would prefer to keep him off vancomycin with his age and other medications that increase risk for nephrotoxic effect). We discussed even consideration of oral doxycycline to treat MRSA as an alternative. He has a sulfa allergy that prohibits use of bactrim. He will continue with meticulous wound care, proper nutrition/diet intake and offloading of the wound.    OPAT ORDERS:  Diagnosis: Sacral osteomyelitis   Culture Result: Pseudomonas and MRSA   Allergies  Allergen Reactions  . Budesonide-Formoterol Fumarate Swelling    Mouth and tongue swelling.  . Iohexol Shortness Of Breath and Itching    Pt was given 100cc  of Omnipaque 300 followed by itching/ dyspnea.  . Simvastatin Other (See Comments)    REACTION: unknown  . Demerol  [Meperidine] Other (See Comments)    REACTION: Hallucinations  . Ivp Dye [Iodinated Diagnostic Agents] Other (See Comments)    Iodine pt had a reaction when he had a CT DONE  . Meperidine Hcl Other (See Comments)    REACTION: Hallucinations  . Tape Other (See Comments)    Tears skin off, Please use "paper" tape  . Naproxen Swelling  . Pregabalin Rash  . Sulfonamide Derivatives Rash    Discharge antibiotics: Ceftazidime 2 gm IV Q12h   + Daptomycin 8mg/kg Q24h (650mg injection)   Duration: 8 weeks   End Date: 09-13-18  PIC Care and Maintenance Per Protocol Please use biopatch for routine care _x_ Please pull PIC at completion of IV antibiotics  Labs weekly while on IV antibiotics: _x_ CBC with differential _x_ CMP  _x_ CK _x_ CRP & ESR every 2 weeks please   Fax weekly labs to [phone]  Clinic Follow Up Appt:  in 6 weeks to evaluate early stop/oral therapy          No orders of the defined types were placed in this encounter.   Return in about 7 weeks (around 08/30/2018).    I spent 30 minutes with the patient including greater than 70% of time in face to face counsel of the patient re PICC line, treatment projection, osteomyelitis, follow up plan and in coordination of his care.     , MSN, NP-C [facility] Pager: [phone] Office: [phone]  07/12/18  12:47 PM  

## 2018-07-12 NOTE — Assessment & Plan Note (Signed)
Recent kidney function reviewed - Creat Clearance calculated @ 59ml/min. Will obtain inflammatory markers with first lab draw and at follow up with me in 7 weeks.

## 2018-07-13 ENCOUNTER — Telehealth: Payer: Self-pay

## 2018-07-13 NOTE — Progress Notes (Signed)
NAMIR, NETO ([id number]) Visit Report for 07/09/2018 Arrival Information Details Patient Name: [name], [name]. Date of Service: 07/09/2018 2:45 PM Medical Record Number: [id number] Patient Account Number: [id number] Date of Birth/Sex: 1931/02/09 (84 y.o. M) Treating RN: Roger Shelter Primary Care Lakisha Peyser: Burman Freestone Other Clinician: Referring Elisandra Deshmukh: Burman Freestone Treating Kindra Bickham/Extender: [name], HOYT Weeks in Treatment: 21 Visit Information History Since Last Visit Added or deleted any medications: No Patient Arrived: Ambulatory Any new allergies or adverse reactions: No Arrival Time: 15:11 Had a fall or experienced change in No Accompanied By: wife,daughter activities of daily living that may affect Transfer Assistance: None risk of falls: Patient Identification Verified: Yes Signs or symptoms of abuse/neglect since last visito No Secondary Verification Process Completed: Yes Hospitalized since last visit: No Patient Requires Transmission-Based No Implantable device outside of the clinic excluding No Precautions: cellular tissue based products placed in the center Patient Has Alerts: Yes since last visit: Has Dressing in Place as Prescribed: Yes Pain Present Now: Yes Electronic Signature(s) Signed: 07/09/2018 4:36:03 PM By: Lorine Bears RCP, RRT, CHT Entered By: Lorine Bears on 07/09/2018 15:12:18 [name] ([id number]) -------------------------------------------------------------------------------- Clinic Level of Care Assessment Details Patient Name: [name] Date of Service: 07/09/2018 2:45 PM Medical Record Number: [id number] Patient Account Number: [id number] Date of Birth/Sex: 12-21-1930 (84 y.o. M) Treating RN: Roger Shelter Primary Care Troy Hartzog: Burman Freestone Other Clinician: Referring Harun Brumley: Burman Freestone Treating Damaria Stofko/Extender: [name], HOYT Weeks in Treatment: 21 Clinic  Level of Care Assessment Items TOOL 4 Quantity Score X - Use when only an EandM is performed on FOLLOW-UP visit 1 0 ASSESSMENTS - Nursing Assessment / Reassessment X - Reassessment of Co-morbidities (includes updates in patient status) 1 10 X- 1 5 Reassessment of Adherence to Treatment Plan ASSESSMENTS - Wound and Skin Assessment / Reassessment X - Simple Wound Assessment / Reassessment - one wound 1 5 []  - 0 Complex Wound Assessment / Reassessment - multiple wounds []  - 0 Dermatologic / Skin Assessment (not related to wound area) ASSESSMENTS - Focused Assessment []  - Circumferential Edema Measurements - multi extremities 0 []  - 0 Nutritional Assessment / Counseling / Intervention []  - 0 Lower Extremity Assessment (monofilament, tuning fork, pulses) []  - 0 Peripheral Arterial Disease Assessment (using hand held doppler) ASSESSMENTS - Ostomy and/or Continence Assessment and Care []  - Incontinence Assessment and Management 0 []  - 0 Ostomy Care Assessment and Management (repouching, etc.) PROCESS - Coordination of Care X - Simple Patient / Family Education for ongoing care 1 15 []  - 0 Complex (extensive) Patient / Family Education for ongoing care []  - 0 Staff obtains Programmer, systems, Records, Test Results / Process Orders []  - 0 Staff telephones HHA, Nursing Homes / Clarify orders / etc []  - 0 Routine Transfer to another Facility (non-emergent condition) []  - 0 Routine Hospital Admission (non-emergent condition) []  - 0 New Admissions / Biomedical engineer / Ordering NPWT, Apligraf, etc. []  - 0 Emergency Hospital Admission (emergent condition) X- 1 10 Simple Discharge Coordination ADRYEN, COOKSON ([id number]) []  - 0 Complex (extensive) Discharge Coordination PROCESS - Special Needs []  - Pediatric / Minor Patient Management 0 []  - 0 Isolation Patient Management []  - 0 Hearing / Language / Visual special needs []  - 0 Assessment of Community assistance (transportation,  D/C planning, etc.) []  - 0 Additional assistance / Altered mentation []  - 0 Support Surface(s) Assessment (bed, cushion, seat, etc.) INTERVENTIONS - Wound Cleansing / Measurement X - Simple Wound Cleansing - one wound 1  5 []  - 0 Complex Wound Cleansing - multiple wounds X- 1 5 Wound Imaging (photographs - any number of wounds) []  - 0 Wound Tracing (instead of photographs) X- 1 5 Simple Wound Measurement - one wound []  - 0 Complex Wound Measurement - multiple wounds INTERVENTIONS - Wound Dressings X - Small Wound Dressing one or multiple wounds 1 10 []  - 0 Medium Wound Dressing one or multiple wounds []  - 0 Large Wound Dressing one or multiple wounds []  - 0 Application of Medications - topical []  - 0 Application of Medications - injection INTERVENTIONS - Miscellaneous []  - External ear exam 0 []  - 0 Specimen Collection (cultures, biopsies, blood, body fluids, etc.) []  - 0 Specimen(s) / Culture(s) sent or taken to Lab for analysis []  - 0 Patient Transfer (multiple staff / Civil Service fast streamer / Similar devices) []  - 0 Simple Staple / Suture removal (25 or less) []  - 0 Complex Staple / Suture removal (26 or more) []  - 0 Hypo / Hyperglycemic Management (close monitor of Blood Glucose) []  - 0 Ankle / Brachial Index (ABI) - do not check if billed separately X- 1 5 Vital Signs TONNY, ISENSEE ([id number]) Has the patient been seen at the hospital within the last three years: Yes Total Score: 75 Level Of Care: New/Established - Level 2 Electronic Signature(s) Signed: 07/10/2018 10:33:34 AM By: Roger Shelter Entered By: Roger Shelter on 07/10/2018 10:33:03 [name] ([id number]) -------------------------------------------------------------------------------- Encounter Discharge Information Details Patient Name: [name] Date of Service: 07/09/2018 2:45 PM Medical Record Number: [id number] Patient Account Number: [id number] Date of Birth/Sex: 21-Sep-1931 (84  y.o. M) Treating RN: Montey Hora Primary Care Ellar Hakala: Burman Freestone Other Clinician: Referring Lorey Pallett: Burman Freestone Treating Roberta Angell/Extender: [name], HOYT Weeks in Treatment: 21 Encounter Discharge Information Items Discharge Condition: Stable Ambulatory Status: Cane Discharge Destination: Home Transportation: Private Auto Accompanied By: daughter Schedule Follow-up Appointment: Yes Clinical Summary of Care: Post Procedure Vitals: Temperature (F): 98.2 Pulse (bpm): 55 Respiratory Rate (breaths/min): 16 Blood Pressure (mmHg): 148/65 Electronic Signature(s) Signed: 07/09/2018 5:27:53 PM By: Montey Hora Entered By: Montey Hora on 07/09/2018 15:50:06 [name] ([id number]) -------------------------------------------------------------------------------- Lower Extremity Assessment Details Patient Name: [name] Date of Service: 07/09/2018 2:45 PM Medical Record Number: [id number] Patient Account Number: [id number] Date of Birth/Sex: 11/20/1930 (84 y.o. M) Treating RN: Secundino Ginger Primary Care Terriana Barreras: Burman Freestone Other Clinician: Referring Lasean Rahming: Burman Freestone Treating Cristian Davitt/Extender: [name], HOYT Weeks in Treatment: 21 Electronic Signature(s) Signed: 07/12/2018 8:43:30 AM By: Secundino Ginger Entered By: Secundino Ginger on 07/09/2018 15:18:20 [name] ([id number]) -------------------------------------------------------------------------------- Multi Wound Chart Details Patient Name: [name] Date of Service: 07/09/2018 2:45 PM Medical Record Number: [id number] Patient Account Number: [id number] Date of Birth/Sex: 1931/05/18 (84 y.o. M) Treating RN: Roger Shelter Primary Care Satina Jerrell: Burman Freestone Other Clinician: Referring Dorsie Burich: Burman Freestone Treating Shakari Qazi/Extender: [name], HOYT Weeks in Treatment: 21 Vital Signs Height(in): 69 Pulse(bpm): 55 Weight(lbs): 155 Blood Pressure(mmHg): 148/65 Body  Mass Index(BMI): 23 Temperature(F): 98.2 Respiratory Rate 18 (breaths/min): Photos: [N/A:N/A] Wound Location: Sacrum N/A N/A Wounding Event: Pressure Injury N/A N/A Primary Etiology: Pressure Ulcer N/A N/A Comorbid History: Cataracts, Asthma, Angina, N/A N/A Arrhythmia, Coronary Artery Disease, Hypertension, Myocardial Infarction, Osteoarthritis, Dementia, Neuropathy Date Acquired: 01/17/2018 N/A N/A Weeks of Treatment: 21 N/A N/A Wound Status: Open N/A N/A Measurements L x W x D 0.1x0.1x0.1 N/A N/A (cm) Area (cm) : 0.008 N/A N/A Volume (cm) : 0.001 N/A N/A % Reduction in Area: 100.00% N/A N/A %  Reduction in Volume: 100.00% N/A N/A Classification: Category/Stage III N/A N/A Exudate Amount: Small N/A N/A Exudate Type: Purulent N/A N/A Exudate Color: yellow, brown, green N/A N/A Wound Margin: Distinct, outline attached N/A N/A Granulation Amount: None Present (0%) N/A N/A Necrotic Amount: Small (1-33%) N/A N/A Necrotic Tissue: Eschar N/A N/A Exposed Structures: Fat Layer (Subcutaneous N/A N/A Tissue) Exposed: Yes Fascia: No KAHLEEL, FADELEY ([id number]) Tendon: No Muscle: No Joint: No Bone: No Epithelialization: None N/A N/A Periwound Skin Texture: Excoriation: Yes N/A N/A Induration: No Callus: No Crepitus: No Rash: No Scarring: No Periwound Skin Moisture: Maceration: No N/A N/A Dry/Scaly: No Periwound Skin Color: Erythema: Yes N/A N/A Atrophie Blanche: No Cyanosis: No Ecchymosis: No Hemosiderin Staining: No Mottled: No Pallor: No Rubor: No Erythema Location: Circumferential N/A N/A Temperature: No Abnormality N/A N/A Tenderness on Palpation: Yes N/A N/A Wound Preparation: Ulcer Cleansing: N/A N/A Rinsed/Irrigated with Saline Topical Anesthetic Applied: Other: lidocaine 4% Treatment Notes Electronic Signature(s) Signed: 07/10/2018 10:33:34 AM By: Roger Shelter Entered By: Roger Shelter on 07/09/2018 15:31:06 [name]  ([id number]) -------------------------------------------------------------------------------- [place] Details Patient Name: ABAD, MANARD. Date of Service: 07/09/2018 2:45 PM Medical Record Number: [id number] Patient Account Number: [id number] Date of Birth/Sex: 1931-02-11 (84 y.o. M) Treating RN: Roger Shelter Primary Care Yulisa Chirico: Burman Freestone Other Clinician: Referring Adham [name]: Burman Freestone Treating Safiyyah Vasconez/Extender: [name], HOYT Weeks in Treatment: 21 Active Inactive ` Abuse / Safety / Falls / Self Care Management Nursing Diagnoses: History of Falls Goals: Patient will not experience any injury related to falls Date Initiated: 02/06/2018 Target Resolution Date: 03/08/2018 Goal Status: Active Interventions: Assess fall risk on admission and as needed Treatment Activities: Patient referred to home care : 02/06/2018 Notes: ` Necrotic Tissue Nursing Diagnoses: Impaired tissue integrity related to necrotic/devitalized tissue Goals: Necrotic/devitalized tissue will be minimized in the wound bed Date Initiated: 02/06/2018 Target Resolution Date: 03/08/2018 Goal Status: Active Interventions: Assess patient pain level pre-, during and post procedure and prior to discharge Treatment Activities: Excisional debridement : 02/06/2018 Notes: ` Nutrition Nursing Diagnoses: Potential for alteratiion in Nutrition/Potential for imbalanced nutrition PADEN, SENGER ([id number]) Goals: Patient/caregiver agrees to and verbalizes understanding of need to obtain nutritional consultation Date Initiated: 02/06/2018 Target Resolution Date: 03/08/2018 Goal Status: Active Interventions: Provide education on nutrition Notes: ` Orientation to the Wound Care Program Nursing Diagnoses: Knowledge deficit related to the wound healing center program Goals: Patient/caregiver will verbalize understanding of the [place] Program Date Initiated:  02/06/2018 Target Resolution Date: 03/08/2018 Goal Status: Active Interventions: Provide education on orientation to the wound center Notes: ` Pressure Nursing Diagnoses: Knowledge deficit related to management of pressures ulcers Goals: Patient/caregiver will verbalize understanding of pressure ulcer management Date Initiated: 02/06/2018 Target Resolution Date: 03/08/2018 Goal Status: Active Interventions: Assess offloading mechanisms upon admission and as needed Provide education on pressure ulcers Notes: ` Wound/Skin Impairment Nursing Diagnoses: Knowledge deficit related to ulceration/compromised skin integrity Goals: Ulcer/skin breakdown will have a volume reduction of 80% by week 12 Date Initiated: 02/06/2018 Target Resolution Date: 04/08/2018 Goal Status: Active CUAUHTEMOC, HUEGEL ([id number]) Interventions: Assess ulceration(s) every visit Treatment Activities: Skin care regimen initiated : 02/06/2018 Topical wound management initiated : 02/06/2018 Notes: Electronic Signature(s) Signed: 07/10/2018 10:33:34 AM By: Roger Shelter Entered By: Roger Shelter on 07/09/2018 15:30:48 [name] ([id number]) -------------------------------------------------------------------------------- Pain Assessment Details Patient Name: [name] Date of Service: 07/09/2018 2:45 PM Medical Record Number: [id number] Patient Account Number: [id number] Date of Birth/Sex: March 20, 1931 (84 y.o. M) Treating RN:  Roger Shelter Primary Care Haevyn Ury: Burman Freestone Other Clinician: Referring Toniya Rozar: Burman Freestone Treating Brandilee Pies/Extender: [name], HOYT Weeks in Treatment: 21 Active Problems Location of Pain Severity and Description of Pain Patient Has Paino Yes Site Locations Rate the pain. Current Pain Level: 6 Worst Pain Level: 6 Least Pain Level: 6 Pain Management and Medication Current Pain Management: Electronic Signature(s) Signed: 07/09/2018 4:36:03 PM  By: Lorine Bears RCP, RRT, CHT Signed: 07/10/2018 10:33:34 AM By: Roger Shelter Entered By: Lorine Bears on 07/09/2018 15:12:51 [name] ([id number]) -------------------------------------------------------------------------------- Patient/Caregiver Education Details Patient Name: TAJH, LIVSEY. Date of Service: 07/09/2018 2:45 PM Medical Record Number: [id number] Patient Account Number: [id number] Date of Birth/Gender: 11-Nov-1930 (84 y.o. M) Treating RN: Montey Hora Primary Care Physician: Burman Freestone Other Clinician: Referring Physician: Burman Freestone Treating Physician/Extender: Sharalyn Ink in Treatment: 21 Education Assessment Education Provided To: Caregiver Education Topics Provided Wound/Skin Impairment: Handouts: Other: wound care as ordered Methods: Demonstration, Explain/Verbal Responses: State content correctly Electronic Signature(s) Signed: 07/09/2018 5:27:53 PM By: Montey Hora Entered By: Montey Hora on 07/09/2018 15:50:24 [name] ([id number]) -------------------------------------------------------------------------------- Wound Assessment Details Patient Name: [name] Date of Service: 07/09/2018 2:45 PM Medical Record Number: [id number] Patient Account Number: [id number] Date of Birth/Sex: 09/24/1931 (84 y.o. M) Treating RN: Roger Shelter Primary Care Jakorian Marengo: Burman Freestone Other Clinician: Referring Israel Wunder: Burman Freestone Treating Dawan Farney/Extender: [name], HOYT Weeks in Treatment: 21 Wound Status Wound Number: 1 Primary Pressure Ulcer Etiology: Wound Location: Sacrum Wound Open Wounding Event: Pressure Injury Status: Date Acquired: 01/17/2018 Comorbid Cataracts, Asthma, Angina, Arrhythmia, Weeks Of Treatment: 21 History: Coronary Artery Disease, Hypertension, Clustered Wound: No Myocardial Infarction, Osteoarthritis, Dementia, Neuropathy Photos Photo  Uploaded By: Secundino Ginger on 07/09/2018 15:26:43 Wound Measurements Length: (cm) 0.2 % Reduction Width: (cm) 0.2 % Reduction Depth: (cm) 1.2 Epitheliali Area: (cm) 0.031 Tunneling: Volume: (cm) 0.038 Underminin in Area: 99.8% in Volume: 99.9% zation: None No g: No Wound Description Classification: Category/Stage III Foul Odor Wound Margin: Distinct, outline attached Slough/Fib Exudate Amount: Small Exudate Type: Purulent Exudate Color: yellow, brown, green After Cleansing: No rino Yes Wound Bed Granulation Amount: None Present (0%) Exposed Structure Necrotic Amount: Small (1-33%) Fascia Exposed: No Necrotic Quality: Eschar Fat Layer (Subcutaneous Tissue) Exposed: Yes Tendon Exposed: No Muscle Exposed: No Joint Exposed: No Bone Exposed: No VISHAAL, STROLLO ([id number]) Periwound Skin Texture Texture Color No Abnormalities Noted: No No Abnormalities Noted: No Callus: No Atrophie Blanche: No Crepitus: No Cyanosis: No Excoriation: Yes Ecchymosis: No Induration: No Erythema: Yes Rash: No Erythema Location: Circumferential Scarring: No Hemosiderin Staining: No Mottled: No Moisture Pallor: No No Abnormalities Noted: No Rubor: No Dry / Scaly: No Maceration: No Temperature / Pain Temperature: No Abnormality Tenderness on Palpation: Yes Wound Preparation Ulcer Cleansing: Rinsed/Irrigated with Saline Topical Anesthetic Applied: Other: lidocaine 4%, Treatment Notes Wound #1 (Sacrum) 1. Cleansed with: Clean wound with Normal Saline 2. Anesthetic Topical Lidocaine 4% cream to wound bed prior to debridement 3. Peri-wound Care: Skin Prep 4. Dressing Applied: Iodoform packing Gauze Other dressing (specify in notes) 5. Secondary Dressing Applied Bordered Foam Dressing Notes drawtex over the iodoform packing Electronic Signature(s) Signed: 07/10/2018 10:33:34 AM By: Roger Shelter Entered By: Roger Shelter on 07/09/2018 15:35:50 [name]  ([id number]) -------------------------------------------------------------------------------- Vitals Details Patient Name: [name] Date of Service: 07/09/2018 2:45 PM Medical Record Number: [id number] Patient Account Number: [id number] Date of Birth/Sex: Feb 06, 1931 (84 y.o. M) Treating RN: Roger Shelter Primary Care Kha Hari: Burman Freestone Other Clinician: Referring  Smrithi Pigford: Burman Freestone Treating Nezzie Manera/Extender: [name], HOYT Weeks in Treatment: 21 Vital Signs Time Taken: 15:10 Temperature (F): 98.2 Height (in): 69 Pulse (bpm): 55 Weight (lbs): 155 Respiratory Rate (breaths/min): 18 Body Mass Index (BMI): 22.9 Blood Pressure (mmHg): 148/65 Reference Range: 80 - 120 mg / dl Electronic Signature(s) Signed: 07/09/2018 4:36:03 PM By: Lorine Bears RCP, RRT, CHT Entered By: Lorine Bears on 07/09/2018 15:15:31

## 2018-07-13 NOTE — Telephone Encounter (Signed)
Faxed H/P, office notes IV antibiotic orders, demographics to [facility] and [facility] same day surgery. Also faxed one time dosing orders to [facility]. Notified Carolynn Sayers of referral to [facility] and made her aware of first dosing at [facility]. Patient arranged for Picc Placement on 07/19/18 at 1pm. Patient's Daughter called and made aware of appointment. Alfred Harrel S.LPN

## 2018-07-19 ENCOUNTER — Ambulatory Visit
Admission: RE | Admit: 2018-07-19 | Discharge: 2018-07-19 | Disposition: A | Discharge status: Home / Self Care | Payer: Medicare Other | Source: Ambulatory Visit | Attending: Infectious Diseases | Admitting: Infectious Diseases

## 2018-07-19 DIAGNOSIS — L89154 Pressure ulcer of sacral region, stage 4: Secondary | ICD-10-CM | POA: Insufficient documentation

## 2018-07-19 MED ORDER — SODIUM CHLORIDE 0.9 % IV SOLN
2.0000 g | Freq: Once | INTRAVENOUS | Status: AC
  Administered 2018-07-19: 2 g via INTRAVENOUS
  Filled 2018-07-19: qty 2

## 2018-07-19 MED ORDER — SODIUM CHLORIDE 0.9% FLUSH: 10.0000 mL | INTRAVENOUS | Status: DC | PRN

## 2018-07-19 MED ORDER — HEPARIN SOD (PORK) LOCK FLUSH 100 UNIT/ML IV SOLN
INTRAVENOUS | Status: AC
  Administered 2018-07-19: 250 [IU]
  Filled 2018-07-19: qty 5

## 2018-07-19 MED ORDER — SODIUM CHLORIDE 0.9% FLUSH: 10.0000 mL | Freq: Two times a day (BID) | INTRAVENOUS | Status: DC

## 2018-07-19 MED ORDER — SODIUM CHLORIDE 0.9 % IV SOLN
650.0000 mg | Freq: Once | INTRAVENOUS | Status: AC
Start: 2018-07-19 — End: 2018-07-19
  Administered 2018-07-19: 650 mg via INTRAVENOUS
  Filled 2018-07-19: qty 13

## 2018-07-19 NOTE — Progress Notes (Signed)
Peripherally Inserted Central Catheter/Midline Placement  The IV Nurse has discussed with the patient and/or persons authorized to consent for the patient, the purpose of this procedure and the potential benefits and risks involved with this procedure.  The benefits include less needle sticks, lab draws from the catheter, and the patient may be discharged home with the catheter. Risks include, but not limited to, infection, bleeding, blood clot (thrombus formation), and puncture of an artery; nerve damage and irregular heartbeat and possibility to perform a PICC exchange if needed/ordered by physician.  Alternatives to this procedure were also discussed.  Bard Power PICC patient education guide, fact sheet on infection prevention and patient information card has been provided to patient /or left at bedside.    PICC/Midline Placement Documentation  PICC Single Lumen 97/67/34 PICC Right Basilic 41 cm 0 cm (Active)  Indication for Insertion or Continuance of Line Home intravenous therapies (PICC only) 07/19/2018  3:25 PM  Exposed Catheter (cm) 0 cm 07/19/2018  3:25 PM  Site Assessment Clean;Dry;Intact 07/19/2018  3:25 PM  Line Status Flushed;Saline locked;Blood return noted 07/19/2018  3:25 PM  Dressing Type Transparent 07/19/2018  3:25 PM  Dressing Status Clean;Dry;Intact 07/19/2018  3:25 PM  Dressing Intervention New dressing 07/19/2018  3:25 PM  Dressing Change Due 07/26/18 07/19/2018  3:25 PM       Quintella Mura, Nicolette Bang 07/19/2018, 3:27 PM

## 2018-07-23 ENCOUNTER — Encounter: Payer: Medicare Other | Attending: Physician Assistant | Admitting: Physician Assistant

## 2018-07-23 DIAGNOSIS — L240 Irritant contact dermatitis due to detergents: Secondary | ICD-10-CM | POA: Diagnosis not present

## 2018-07-23 DIAGNOSIS — I252 Old myocardial infarction: Secondary | ICD-10-CM | POA: Insufficient documentation

## 2018-07-23 DIAGNOSIS — F0390 Unspecified dementia without behavioral disturbance: Secondary | ICD-10-CM | POA: Insufficient documentation

## 2018-07-23 DIAGNOSIS — I2510 Atherosclerotic heart disease of native coronary artery without angina pectoris: Secondary | ICD-10-CM | POA: Insufficient documentation

## 2018-07-23 DIAGNOSIS — G629 Polyneuropathy, unspecified: Secondary | ICD-10-CM | POA: Diagnosis not present

## 2018-07-23 DIAGNOSIS — Z85820 Personal history of malignant melanoma of skin: Secondary | ICD-10-CM | POA: Diagnosis not present

## 2018-07-23 DIAGNOSIS — I10 Essential (primary) hypertension: Secondary | ICD-10-CM | POA: Diagnosis not present

## 2018-07-23 DIAGNOSIS — L89154 Pressure ulcer of sacral region, stage 4: Secondary | ICD-10-CM | POA: Diagnosis present

## 2018-07-26 NOTE — Progress Notes (Signed)
[name], [name] ([id number]) Visit Report for 07/23/2018 Chief Complaint Document Details Patient Name: [name], [name]. Date of Service: 07/23/2018 1:30 PM Medical Record Number: [id number] Patient Account Number: [id number] Date of Birth/Sex: Jul 25, 1931 (84 y.o. M) Treating RN: Roger Shelter Primary Care Provider: Burman Freestone Other Clinician: Referring Provider: Burman Freestone Treating Provider/Extender: [name], Lylie Blacklock Weeks in Treatment: 23 Information Obtained from: Patient Chief Complaint Sacral pressure ulcer Electronic Signature(s) Signed: 07/23/2018 5:00:41 PM By: Worthy Keeler PA-C Entered By: Worthy Keeler on 07/23/2018 13:50:36 [name] ([id number]) -------------------------------------------------------------------------------- HPI Details Patient Name: [name] Date of Service: 07/23/2018 1:30 PM Medical Record Number: [id number] Patient Account Number: [id number] Date of Birth/Sex: 03-Feb-1931 (84 y.o. M) Treating RN: Roger Shelter Primary Care Provider: Burman Freestone Other Clinician: Referring Provider: Burman Freestone Treating Provider/Extender: [name], Kabao Leite Weeks in Treatment: 23 History of Present Illness HPI Description: 02/06/18 on evaluation today patient presents for initial evaluation and our clinic concerning an issue which began roughly 3 weeks ago when the patient fell in his home on the floor in his kitchen and laid him down this detergent for roughly 3 days. He had a pressure injury to the left shoulder. This unfortunately has caused him a lot of discomfort although it finally seems to be doing better if anything is really having a lot of itching right now. This appears potentially be a contact dermatitis issue. He also has a significant pressure injury to the sacrum at this time as well which is also showing fascia exposure right over the bone but no evidence of bone exposure at this point which is good news. They have  been using Santyl as well as Saline soaked gauze at this point in time. There does appear to be a lot of necrotic slough in the base of the wound. He does have a history of incontinence, myocardial infarction, and hypertension. He also is "borderline diabetic" hemoglobin A1c of 6.0. Currently he has some discomfort in the pressure site at the sacrum but fortunately nothing too significant this did require sharp debridement today. 02/13/18 on evaluation today patient appears to be doing much better in regard to his sacral wound. He has been tolerating the dressing changes without complication with the Vashe. Fortunately there is no evidence of infection and though there is some Slough on the surface of the wound bed he has excellent granulation noted. Overall I'm pleased with how things have progressed in that regard. A glance at his shoulder as well and the rash seems to be someone improving in my pinion at this site as well. Overall I am pleased with what we're seeing and so is the family. 02/20/18 on evaluation today patient appears to be doing a little worse in regard to the sacral wound only in the fact that there is redness surrounding it has me somewhat concerned for infection. The drainage has also apparently been a little bit off color compared to normal according to family they did keep the dressing today that was removed to show me and I agree this seems to be a little bit different compared to what we have been seeing. Coupled with the redness I'm concerned he may be developing some cellulitis surrounding the wound bed. 02/27/18 on evaluation today patient presents for follow-up concerning his sacral ulcer. We have received the results back from his wound culture which shows unfortunately that the doxycycline will not be of benefit for him I am going to need to initiate treatment with something else  in order to treat the pseudomonas. Otherwise he does not seem to be having any  significant pain although his daughter states there are sometimes when he states having pain. We continue to use the Vashe currently. 03/06/18 on evaluation today patient's sacral wound appears to be doing better in my opinion. He has been tolerating the dressing changes without complication. With that being said the silver nitrate has helped with the prominent area of hyper granulation at the 6 o'clock location we will likely need to repeat this again today. Nonetheless overall I am pleased with how things have improved over the last week. The erythema surrounding the wound seems to be greatly improved. 03/13/18 on evaluation today patient's wound actually does not appear to be terribly infected although she does continue to have erythema surrounding the wound bed especially on the left border. I'm still somewhat concerned about the fact that the oral antibiotics alone may not be completely treating his infection. I previously discussed may need to go for IV antibiotic therapy I'm concerned that may be the case. We will need to make a referral today for infectious disease. 03/20/18 on evaluation today the patient sacral wound actually appears to be doing fairly well in regard to granulation although he continues unfortunately to have it your theme is surrounding the periwound region. There's also some increased swelling at the 6 o'clock location which also has me somewhat concerned. With that being said he does have some discomfort but nothing too significant at this point. He still has not heard from infectious disease his daughter and wife are both present during the office visit today they're going to check back with this again. We did get the information for them to call them today. 03/27/18 on evaluation today patient is seen concerning his ongoing sacral ulcer. He has been tolerating the dressing changes without complication. With that being said he does present with evidence of bright green  drainage noted on the dressing which again is something that I do often expect to see with a pseudomonas infection. He continues to have your theme is [name], [name] ([id number]) surrounding the wound bed as well and again I'm not 100% convinced this is just pressure related. I did speak with Colletta Maryland who is the nurse practitioner in [place] with infectious disease. I spoke with her actually yesterday concerning this patient. She is not 100% convinced that this is infected. She question whether the wound may just be colonized with Pseudomonas and not actually causing an active infection. I am really not thinking that the edema is associated with pressure alone and again overall I don't feel that your theme and is consistent with a pressure injury either as he's never had any contusions noted like a deep tissue injury on his heel which was new and I did visualize today this was on the left heel. Nonetheless she wanted to give this a little bit more time and thought it would be appropriate to start the Wound VAC at this point. 04/03/18 on evaluation today patient sacral ulcer actually appears to be doing fairly well at this point. He has been tolerating the dressing changes without complication. With that being said I'm very pleased with the progress that has been made in regard to his sacral wound over the past week I do not see as much in the way of erythema which is great news. Nonetheless he does have a small area of hyper granulation unfortunately. This is at roughly the 7 o'clock location and I  think does need to be addressed so that this will heal more appropriately. Nonetheless I think we may be ready to go ahead and initiate therapy with the Wound VAC. 04/10/18 on evaluation today patient appears to be doing excellent in regard to his sacral ulcer. The show signs of great improvement in overall I'm very pleased with how things look. He has been tolerating the dressing changes  without complication. Specifically this is the Wound VAC. He also seems to be doing well with the antibiotic there is decreased your theme and redness surrounding the sacral area at this point in the wound has filled in quite significantly. 04/17/18 on evaluation today patient actually appears to be doing excellent in regard to his sacral ulcer. He's been tolerating the dressing changes without complication specifically the Wound VAC. There really are no major concerns from the patient nor family this point he is having no pain. He does have a little bit of Epiboly on the lateral portions of the wound where he does have a little bit more depth that will need to be addressed today. 04/23/18 on evaluation today patient's wound actually appears to be doing excellent at this point. He has been tolerating the Wound VAC and this appears to be doing well other than the fact that it seems to be breaking seal at the 6 o'clock location. I do believe that adding a duodenum dressing at this location try and help maintain the seal would be appropriate and likely very effective. With that being said he overall seems to be showing signs of good improvement at this point. There does not appear to be any evidence of significant infection which is also excellent news. 04/30/18 on evaluation today patient actually appears to be doing well in regard to his sacral ulcer. He's been tolerating the dressing changes without complication. Fortunately there does not appear to be any evidence of infection. Overall I'm very pleased with the progress that has been made up to this point. He does have some blistering underneath the draping unfortunately although this is definitely something that has been noted on other patients previously is a fairly common occurrence. Nonetheless the patient seems to be doing fairly well in general in my pinion based on what I see at this time. I do believe these are fairly superficial and  minor. 05/07/18 on evaluation today patient's wound actually appears to be doing excellent at this point in time. He has been tolerating the Wound VAC decently well he states that it is somewhat cumbersome to carry around unfortunately. The only other issue he's been having according to family is that they been having a difficult time keeping the Wound VAC in place and doing what is supposed to do without making. Obviously I do think that this is definitely of concern. Nonetheless I do believe she's made good progress up to this point. I'm very happy in that regard. 05/14/18 on evaluation today patient's wound continues to make good progress at this point. He had a minimal amount of slough noted on the surface which was easily wiped away with saline and gauze and in general I feel like that he is continuing to show excellent progress even with the discontinuation of the Wound VAC. Overall I'm pleased in this regard. He was having issues with the Wound VAC in getting it to seal I think that using the Prisma at this time has been equally efficient and getting the wound to diminish in size. 05/29/18- He is here in follow up  evaluation for a sacral ulcer. There is improvement, we will continue with prisma and he will follow up in two weeks 06/11/18 on evaluation today patient had unfortunately bright green drainage on the dressing upon evaluation today. This is something that we have encountered before although we were able to get things under control previously with antibiotics. With that being said currently upon further inspection of the three areas of hyper granulation that were separate from the actual wound itself which was almost healed it really appears that these all have some depth to them. They are more tunnels that really have not closed or at least have reopened as a result likely of infection in my pinion. This is definitely not what I was expecting or hoping for. [name], [name]  ([id number]) 06/26/18 on evaluation today patient continues to experience issues with what appears to be small abscesses in the sacral region unfortunately. With that being said he has been tolerating the dressing changes without complication which is good news. He's not having any significant discomfort which is also good news. With that being said his daughter states that after he left last week that the packing that we have placed fell out quite rapidly and he subsequently healed over very quick to the point they were not able to even repack the regions. Nonetheless there appear to be several fluctuance areas noted at this point there's one central region that does seem to be draining still discharge that is somewhat green in color. I did review the results of the wound culture which did show evidence of infection with both MRSA as well as pseudomonas based on that result. Nonetheless again with his other current medications we are not able to do the Cipro due to issues with potential long QT syndrome. I am going to give him a prescription for doxycycline in order to help with the potential MRSA infection. 07/09/18 on evaluation today patient actually appears to be doing about the same in regard to his sacral wound. He actually has his MRI scheduled for tomorrow and then subsequently is going to be having his infectious disease appointment for Thursday of this week. Fortunately he's not having any significant discomfort he still has your theme in the sacral region he still has several blister/flux went areas although they technically are not blisters this is more like a underlying abscess. The one area that is open still does probe down to bone. Again I am concerned about a deeper infection possibly even sacral osteomyelitis. 07/23/18 on evaluation today patient actually appears to be doing very well all things considered in regard to his sacral ulcer. Since I've last seen him he actually did have  his MRI performed which showed that he has a complex fluid collection superficial to the distal sacrum measuring 5.9 x 4.4 x 2.8 cm which abuts the posterior aspect of the distal sacrum and the sacrum itself shows cortical destruction consistent with osteomyelitis. She has also been seen by infectious disease and currently orders have been initiated for IV antibiotic therapy for the next eight weeks. He was seen by Janene Madeira NP and placed on Ceftazidime and Daptomycin. Currently he has not really been on this quite long enough to see a sufficient response to the new orders as far as antibiotic therapy is concerned. Nonetheless it does appear that he is likely on the right track at this point which is great news. Nonetheless the question which both Colletta Maryland and myself have discussed both with the patient and between  ourselves is whether or not the patient may need to be seen by surgeon for surgical evacuation of the fluid collection/abscess and possible debridement of the sacrum itself. Nonetheless at this time my personal opinion is probably gonna be that we wait and get this at least a couple weeks to see the response that he receives with the IV antibiotic therapy. Electronic Signature(s) Signed: 07/23/2018 5:00:41 PM By: Worthy Keeler PA-C Entered By: Worthy Keeler on 07/23/2018 16:40:04 [name] ([id number]) -------------------------------------------------------------------------------- Physical Exam Details Patient Name: MANAS, HICKLING Date of Service: 07/23/2018 1:30 PM Medical Record Number: [id number] Patient Account Number: [id number] Date of Birth/Sex: Sep 26, 1931 (84 y.o. M) Treating RN: Roger Shelter Primary Care Provider: Burman Freestone Other Clinician: Referring Provider: Burman Freestone Treating Provider/Extender: STONE III, Secily Walthour Weeks in Treatment: 23 Constitutional Well-nourished and well-hydrated in no acute distress. Respiratory normal breathing  without difficulty. clear to auscultation bilaterally. Cardiovascular regular rate and rhythm with normal S1, S2. Psychiatric this patient is able to make decisions and demonstrates good insight into disease process. Alert and Oriented x 3. pleasant and cooperative. Notes Patient's wound bed currently shows evidence of your theme is surrounding the wound bed itself. He does have an opening which actually probes down to the sacrum and I was able to probe been. Fortunately there does not appear to be any evidence of infection at this time which is good news. In general I feel like the patient has made good progress when it comes down to his wound in general since I first saw him. Nonetheless still this has been somewhat of a setback for him that I know has been a frustration both for the patient as well as his family. Electronic Signature(s) Signed: 07/23/2018 5:00:41 PM By: Worthy Keeler PA-C Entered By: Worthy Keeler on 07/23/2018 16:40:51 [name] ([id number]) -------------------------------------------------------------------------------- Physician Orders Details Patient Name: [name], [name] Date of Service: 07/23/2018 1:30 PM Medical Record Number: [id number] Patient Account Number: [id number] Date of Birth/Sex: 04-16-1931 (84 y.o. M) Treating RN: Roger Shelter Primary Care Provider: Burman Freestone Other Clinician: Referring Provider: Burman Freestone Treating Provider/Extender: [name], Tanyon Alipio Weeks in Treatment: 40 Verbal / Phone Orders: No Diagnosis Coding ICD-10 Coding Code Description L89.154 Pressure ulcer of sacral region, stage 4 L24.0 Irritant contact dermatitis due to detergents Wound Cleansing Wound #1 Sacrum o Clean wound with Normal Saline. Anesthetic (add to Medication List) Wound #1 Sacrum o Topical Lidocaine 4% cream applied to wound bed prior to debridement (In Clinic Only). Skin Barriers/Peri-Wound Care Wound #1 Sacrum o Skin  Prep Primary Wound Dressing Wound #1 Sacrum o Other: - silvercell rope to pack into wound Secondary Dressing Wound #1 Sacrum o Boardered Foam Dressing - use large border foam to cover entire wound o Drawtex - over the three areas near base of wound due to drainage. Dressing Change Frequency Wound #1 Sacrum o Change Dressing Monday, Wednesday, Friday - HHRN to provide wound care on Wednesday and Friday. Patient to come to clinic on Monday. Follow-up Appointments Wound #1 Sacrum o Return Appointment in 2 weeks. Off-Loading Wound #1 Sacrum o Mattress - hospital bed o Turn and reposition every 2 hours o Other: - please make sure the pt is floating his heels while lying in bed [name], [name]. ([id number]) Additional Orders / Instructions Wound #1 Sacrum o Increase protein intake. Home Health Wound #1 [place] Visits - [facility]- [facility] to provide wound care on Wednesday and Friday patient comes to clinic  on Mondays o Home Health Nurse may visit PRN to address patientos wound care needs. o FACE TO FACE ENCOUNTER: MEDICARE and MEDICAID PATIENTS: I certify that this patient is under my care and that I had a face-to-face encounter that meets the physician face-to-face encounter requirements with this patient on this date. The encounter with the patient was in whole or in part for the following MEDICAL CONDITION: (primary reason for [place]) MEDICAL NECESSITY: I certify, that based on my findings, NURSING services are a medically necessary home health service. HOME BOUND STATUS: I certify that my clinical findings support that this patient is homebound (i.e., Due to illness or injury, pt requires aid of supportive devices such as crutches, cane, wheelchairs, walkers, the use of special transportation or the assistance of another person to leave their place of residence. There is a normal inability to leave the home and doing so requires  considerable and taxing effort. Other absences are for medical reasons / religious services and are infrequent or of short duration when for other reasons). o If current dressing causes regression in wound condition, may D/C ordered dressing product/s and apply Normal Saline Moist Dressing daily until next [place] / Other MD appointment. [place] of regression in wound condition at [phone]. o Please direct any NON-WOUND related issues/requests for orders to patient's Primary Care Physician Electronic Signature(s) Signed: 07/23/2018 4:35:19 PM By: Roger Shelter Signed: 07/23/2018 5:00:41 PM By: Worthy Keeler PA-C Entered By: Roger Shelter on 07/23/2018 14:35:44 [name] ([id number]) -------------------------------------------------------------------------------- Problem List Details Patient Name: KY, RUMPLE. Date of Service: 07/23/2018 1:30 PM Medical Record Number: [id number] Patient Account Number: [id number] Date of Birth/Sex: 10-Sep-1931 (84 y.o. M) Treating RN: Roger Shelter Primary Care Provider: Burman Freestone Other Clinician: Referring Provider: Burman Freestone Treating Provider/Extender: [name], Amro Winebarger Weeks in Treatment: 23 Active Problems ICD-10 Evaluated Encounter Code Description Active Date Today Diagnosis L89.154 Pressure ulcer of sacral region, stage 4 02/06/2018 No Yes L24.0 Irritant contact dermatitis due to detergents 02/06/2018 No Yes Inactive Problems Resolved Problems Electronic Signature(s) Signed: 07/23/2018 5:00:41 PM By: Worthy Keeler PA-C Entered By: Worthy Keeler on 07/23/2018 13:50:31 [name] ([id number]) -------------------------------------------------------------------------------- Progress Note Details Patient Name: [name] Date of Service: 07/23/2018 1:30 PM Medical Record Number: [id number] Patient Account Number: [id number] Date of Birth/Sex: [birth year] (84 y.o.  M) Treating RN: Roger Shelter Primary Care Provider: Burman Freestone Other Clinician: Referring Provider: Burman Freestone Treating Provider/Extender: [name], Madolin Twaddle Weeks in Treatment: 23 Subjective Chief Complaint Information obtained from Patient Sacral pressure ulcer History of Present Illness (HPI) 02/06/18 on evaluation today patient presents for initial evaluation and our clinic concerning an issue which began roughly 3 weeks ago when the patient fell in his home on the floor in his kitchen and laid him down this detergent for roughly 3 days. He had a pressure injury to the left shoulder. This unfortunately has caused him a lot of discomfort although it finally seems to be doing better if anything is really having a lot of itching right now. This appears potentially be a contact dermatitis issue. He also has a significant pressure injury to the sacrum at this time as well which is also showing fascia exposure right over the bone but no evidence of bone exposure at this point which is good news. They have been using Santyl as well as Saline soaked gauze at this point in time. There does appear to be a lot of necrotic slough  in the base of the wound. He does have a history of incontinence, myocardial infarction, and hypertension. He also is "borderline diabetic" hemoglobin A1c of 6.0. Currently he has some discomfort in the pressure site at the sacrum but fortunately nothing too significant this did require sharp debridement today. 02/13/18 on evaluation today patient appears to be doing much better in regard to his sacral wound. He has been tolerating the dressing changes without complication with the Vashe. Fortunately there is no evidence of infection and though there is some Slough on the surface of the wound bed he has excellent granulation noted. Overall I'm pleased with how things have progressed in that regard. A glance at his shoulder as well and the rash seems to be someone  improving in my pinion at this site as well. Overall I am pleased with what we're seeing and so is the family. 02/20/18 on evaluation today patient appears to be doing a little worse in regard to the sacral wound only in the fact that there is redness surrounding it has me somewhat concerned for infection. The drainage has also apparently been a little bit off color compared to normal according to family they did keep the dressing today that was removed to show me and I agree this seems to be a little bit different compared to what we have been seeing. Coupled with the redness I'm concerned he may be developing some cellulitis surrounding the wound bed. 02/27/18 on evaluation today patient presents for follow-up concerning his sacral ulcer. We have received the results back from his wound culture which shows unfortunately that the doxycycline will not be of benefit for him I am going to need to initiate treatment with something else in order to treat the pseudomonas. Otherwise he does not seem to be having any significant pain although his daughter states there are sometimes when he states having pain. We continue to use the Vashe currently. 03/06/18 on evaluation today patient's sacral wound appears to be doing better in my opinion. He has been tolerating the dressing changes without complication. With that being said the silver nitrate has helped with the prominent area of hyper granulation at the 6 o'clock location we will likely need to repeat this again today. Nonetheless overall I am pleased with how things have improved over the last week. The erythema surrounding the wound seems to be greatly improved. 03/13/18 on evaluation today patient's wound actually does not appear to be terribly infected although she does continue to have erythema surrounding the wound bed especially on the left border. I'm still somewhat concerned about the fact that the oral antibiotics alone may not be completely  treating his infection. I previously discussed may need to go for IV antibiotic therapy I'm concerned that may be the case. We will need to make a referral today for infectious disease. 03/20/18 on evaluation today the patient sacral wound actually appears to be doing fairly well in regard to granulation although he continues unfortunately to have it your theme is surrounding the periwound region. There's also some increased swelling at the 6 o'clock location which also has me somewhat concerned. With that being said he does have some discomfort but [name], [name]. ([id number]) nothing too significant at this point. He still has not heard from infectious disease his daughter and wife are both present during the office visit today they're going to check back with this again. We did get the information for them to call them today. 03/27/18 on evaluation today patient  is seen concerning his ongoing sacral ulcer. He has been tolerating the dressing changes without complication. With that being said he does present with evidence of bright green drainage noted on the dressing which again is something that I do often expect to see with a pseudomonas infection. He continues to have your theme is surrounding the wound bed as well and again I'm not 100% convinced this is just pressure related. I did speak with Colletta Maryland who is the nurse practitioner in [place] with infectious disease. I spoke with her actually yesterday concerning this patient. She is not 100% convinced that this is infected. She question whether the wound may just be colonized with Pseudomonas and not actually causing an active infection. I am really not thinking that the edema is associated with pressure alone and again overall I don't feel that your theme and is consistent with a pressure injury either as he's never had any contusions noted like a deep tissue injury on his heel which was new and I did visualize today this was on the left  heel. Nonetheless she wanted to give this a little bit more time and thought it would be appropriate to start the Wound VAC at this point. 04/03/18 on evaluation today patient sacral ulcer actually appears to be doing fairly well at this point. He has been tolerating the dressing changes without complication. With that being said I'm very pleased with the progress that has been made in regard to his sacral wound over the past week I do not see as much in the way of erythema which is great news. Nonetheless he does have a small area of hyper granulation unfortunately. This is at roughly the 7 o'clock location and I think does need to be addressed so that this will heal more appropriately. Nonetheless I think we may be ready to go ahead and initiate therapy with the Wound VAC. 04/10/18 on evaluation today patient appears to be doing excellent in regard to his sacral ulcer. The show signs of great improvement in overall I'm very pleased with how things look. He has been tolerating the dressing changes without complication. Specifically this is the Wound VAC. He also seems to be doing well with the antibiotic there is decreased your theme and redness surrounding the sacral area at this point in the wound has filled in quite significantly. 04/17/18 on evaluation today patient actually appears to be doing excellent in regard to his sacral ulcer. He's been tolerating the dressing changes without complication specifically the Wound VAC. There really are no major concerns from the patient nor family this point he is having no pain. He does have a little bit of Epiboly on the lateral portions of the wound where he does have a little bit more depth that will need to be addressed today. 04/23/18 on evaluation today patient's wound actually appears to be doing excellent at this point. He has been tolerating the Wound VAC and this appears to be doing well other than the fact that it seems to be breaking seal at the 6  o'clock location. I do believe that adding a duodenum dressing at this location try and help maintain the seal would be appropriate and likely very effective. With that being said he overall seems to be showing signs of good improvement at this point. There does not appear to be any evidence of significant infection which is also excellent news. 04/30/18 on evaluation today patient actually appears to be doing well in regard to his sacral ulcer.  He's been tolerating the dressing changes without complication. Fortunately there does not appear to be any evidence of infection. Overall I'm very pleased with the progress that has been made up to this point. He does have some blistering underneath the draping unfortunately although this is definitely something that has been noted on other patients previously is a fairly common occurrence. Nonetheless the patient seems to be doing fairly well in general in my pinion based on what I see at this time. I do believe these are fairly superficial and minor. 05/07/18 on evaluation today patient's wound actually appears to be doing excellent at this point in time. He has been tolerating the Wound VAC decently well he states that it is somewhat cumbersome to carry around unfortunately. The only other issue he's been having according to family is that they been having a difficult time keeping the Wound VAC in place and doing what is supposed to do without making. Obviously I do think that this is definitely of concern. Nonetheless I do believe she's made good progress up to this point. I'm very happy in that regard. 05/14/18 on evaluation today patient's wound continues to make good progress at this point. He had a minimal amount of slough noted on the surface which was easily wiped away with saline and gauze and in general I feel like that he is continuing to show excellent progress even with the discontinuation of the Wound VAC. Overall I'm pleased in this regard. He  was having issues with the Wound VAC in getting it to seal I think that using the Prisma at this time has been equally efficient and getting the wound to diminish in size. 05/29/18- He is here in follow up evaluation for a sacral ulcer. There is improvement, we will continue with prisma and he will follow up in two weeks [name], [name] ([id number]) 06/11/18 on evaluation today patient had unfortunately bright green drainage on the dressing upon evaluation today. This is something that we have encountered before although we were able to get things under control previously with antibiotics. With that being said currently upon further inspection of the three areas of hyper granulation that were separate from the actual wound itself which was almost healed it really appears that these all have some depth to them. They are more tunnels that really have not closed or at least have reopened as a result likely of infection in my pinion. This is definitely not what I was expecting or hoping for. 06/26/18 on evaluation today patient continues to experience issues with what appears to be small abscesses in the sacral region unfortunately. With that being said he has been tolerating the dressing changes without complication which is good news. He's not having any significant discomfort which is also good news. With that being said his daughter states that after he left last week that the packing that we have placed fell out quite rapidly and he subsequently healed over very quick to the point they were not able to even repack the regions. Nonetheless there appear to be several fluctuance areas noted at this point there's one central region that does seem to be draining still discharge that is somewhat green in color. I did review the results of the wound culture which did show evidence of infection with both MRSA as well as pseudomonas based on that result. Nonetheless again with his other current medications  we are not able to do the Cipro due to issues with potential long QT syndrome. I  am going to give him a prescription for doxycycline in order to help with the potential MRSA infection. 07/09/18 on evaluation today patient actually appears to be doing about the same in regard to his sacral wound. He actually has his MRI scheduled for tomorrow and then subsequently is going to be having his infectious disease appointment for Thursday of this week. Fortunately he's not having any significant discomfort he still has your theme in the sacral region he still has several blister/flux went areas although they technically are not blisters this is more like a underlying abscess. The one area that is open still does probe down to bone. Again I am concerned about a deeper infection possibly even sacral osteomyelitis. 07/23/18 on evaluation today patient actually appears to be doing very well all things considered in regard to his sacral ulcer. Since I've last seen him he actually did have his MRI performed which showed that he has a complex fluid collection superficial to the distal sacrum measuring 5.9 x 4.4 x 2.8 cm which abuts the posterior aspect of the distal sacrum and the sacrum itself shows cortical destruction consistent with osteomyelitis. She has also been seen by infectious disease and currently orders have been initiated for IV antibiotic therapy for the next eight weeks. He was seen by Janene Madeira NP and placed on Ceftazidime and Daptomycin. Currently he has not really been on this quite long enough to see a sufficient response to the new orders as far as antibiotic therapy is concerned. Nonetheless it does appear that he is likely on the right track at this point which is great news. Nonetheless the question which both Colletta Maryland and myself have discussed both with the patient and between ourselves is whether or not the patient may need to be seen by surgeon for surgical evacuation of the fluid  collection/abscess and possible debridement of the sacrum itself. Nonetheless at this time my personal opinion is probably gonna be that we wait and get this at least a couple weeks to see the response that he receives with the IV antibiotic therapy. Patient History Information obtained from Patient. Allergies Lyrica, gabapentin, Sulfa (Sulfonamide Antibiotics), Iodinated Contrast- Oral and IV Dye, budesonide, iohexol, simvastatin, Demerol, meperidine, adhesive tape, naproxen, pregabalin Family History Cancer - Paternal Grandparents, Diabetes - Father, Heart Disease - Mother,Father, Stroke - Father, No family history of Hypertension, Kidney Disease, Lung Disease, Seizures, Thyroid Problems, Tuberculosis. Social History Never smoker, Marital Status - Widowed, Alcohol Use - Never, Drug Use - No History, Caffeine Use - Daily. Medical History Hospitalization/Surgery History - 01/20/2018, [facility], [facility]. Medical And Surgical History Notes Endocrine Borderline [name], [name] ([id number]) Oncologic Melanoma on back Review of Systems (ROS) Constitutional Symptoms ([place]) Denies complaints or symptoms of Fever, Chills. Respiratory The patient has no complaints or symptoms. Cardiovascular The patient has no complaints or symptoms. Psychiatric The patient has no complaints or symptoms. Objective Constitutional Well-nourished and well-hydrated in no acute distress. Vitals Time Taken: 1:35 PM, Height: 69 in, Weight: 155 lbs, BMI: 22.9, Temperature: 98.2 F, Pulse: 52 bpm, Respiratory Rate: 18 breaths/min, Blood Pressure: 159/62 mmHg. Respiratory normal breathing without difficulty. clear to auscultation bilaterally. Cardiovascular regular rate and rhythm with normal S1, S2. Psychiatric this patient is able to make decisions and demonstrates good insight into disease process. Alert and Oriented x 3. pleasant and cooperative. General Notes: Patient's wound bed currently shows  evidence of your theme is surrounding the wound bed itself. He does have an opening which actually probes down to  the sacrum and I was able to probe been. Fortunately there does not appear to be any evidence of infection at this time which is good news. In general I feel like the patient has made good progress when it comes down to his wound in general since I first saw him. Nonetheless still this has been somewhat of a setback for him that I know has been a frustration both for the patient as well as his family. Integumentary (Hair, Skin) Wound #1 status is Open. Original cause of wound was Pressure Injury. The wound is located on the Sacrum. The wound measures 1.5cm length x 0.4cm width x 2cm depth; 0.471cm^2 area and 0.942cm^3 volume. There is Fat Layer (Subcutaneous Tissue) Exposed exposed. There is no tunneling or undermining noted. There is a small amount of purulent drainage noted. The wound margin is distinct with the outline attached to the wound base. There is no granulation within the wound bed. There is a small (1-33%) amount of necrotic tissue within the wound bed including Eschar and Adherent Slough. The periwound skin appearance exhibited: Excoriation, Erythema. The periwound skin appearance did not exhibit: Callus, Crepitus, Induration, Rash, Scarring, Dry/Scaly, Maceration, Atrophie Blanche, Cyanosis, Ecchymosis, Hemosiderin Staining, Mottled, Pallor, Rubor. The surrounding wound skin color is noted with erythema which is circumferential. Periwound temperature was noted as No Abnormality. The periwound has tenderness on palpation. [name], [name] ([id number]) Assessment Active Problems ICD-10 Pressure ulcer of sacral region, stage 4 Irritant contact dermatitis due to detergents Plan Wound Cleansing: Wound #1 Sacrum: Clean wound with Normal Saline. Anesthetic (add to Medication List): Wound #1 Sacrum: Topical Lidocaine 4% cream applied to wound bed prior to debridement (In  Clinic Only). Skin Barriers/Peri-Wound Care: Wound #1 Sacrum: Skin Prep Primary Wound Dressing: Wound #1 Sacrum: Other: - silvercell rope to pack into wound Secondary Dressing: Wound #1 Sacrum: Boardered Foam Dressing - use large border foam to cover entire wound Drawtex - over the three areas near base of wound due to drainage. Dressing Change Frequency: Wound #1 Sacrum: Change Dressing Monday, Wednesday, Friday - HHRN to provide wound care on Wednesday and Friday. Patient to come to clinic on Monday. Follow-up Appointments: Wound #1 Sacrum: Return Appointment in 2 weeks. Off-Loading: Wound #1 Sacrum: Mattress - hospital bed Turn and reposition every 2 hours Other: - please make sure the pt is floating his heels while lying in bed Additional Orders / Instructions: Wound #1 Sacrum: Increase protein intake. Home Health: Wound #1 Sacrum: Continue Home Health Visits - Jackquline [place]- [facility] to provide wound care on Wednesday and Friday patient comes to clinic on Mondays Home Health Nurse may visit PRN to address patient s wound care needs. FACE TO FACE ENCOUNTER: MEDICARE and MEDICAID PATIENTS: I certify that this patient is under my care and that I had a face-to-face encounter that meets the physician face-to-face encounter requirements with this patient on this date. The encounter with the patient was in whole or in part for the following MEDICAL CONDITION: (primary reason for [place]) MEDICAL NECESSITY: I certify, that based on my findings, NURSING services are a medically necessary home health service. HOME BOUND STATUS: I certify that my clinical findings support that this patient is homebound (i.e., Due to [name], [name] ([id number]) illness or injury, pt requires aid of supportive devices such as crutches, cane, wheelchairs, walkers, the use of special transportation or the assistance of another person to leave their place of residence. There is a normal inability to leave  the home and doing  so requires considerable and taxing effort. Other absences are for medical reasons / religious services and are infrequent or of short duration when for other reasons). If current dressing causes regression in wound condition, may D/C ordered dressing product/s and apply Normal Saline Moist Dressing daily until next [place] / Other MD appointment. [place] of regression in wound condition at [phone]. Please direct any NON-WOUND related issues/requests for orders to patient's Primary Care Physician My suggestion currently is gonna be that we continue with the above wound care measures for the next week. The patient is in agreement the plan. If anything changes or worsens meantime he will contact the office and let me know. Otherwise will see were things stand in two weeks. She's not doing significantly better than we may need to consider referral to a general surgeon to consider debridement and evacuation of the fluid collection. We will see were things stand. Please see above for specific wound care orders. We will see patient for re-evaluation in 2 week(s) here in the clinic. If anything worsens or changes patient will contact our office for additional recommendations. Electronic Signature(s) Signed: 07/23/2018 5:00:41 PM By: Worthy Keeler PA-C Entered By: Worthy Keeler on 07/23/2018 16:41:44 [name] ([id number]) -------------------------------------------------------------------------------- ROS/PFSH Details Patient Name: [name] Date of Service: 07/23/2018 1:30 PM Medical Record Number: [id number] Patient Account Number: [id number] Date of Birth/Sex: [birth year] (84 y.o. M) Treating RN: Roger Shelter Primary Care Provider: Burman Freestone Other Clinician: Referring Provider: Burman Freestone Treating Provider/Extender: [name], Valeen Borys Weeks in Treatment: 23 Information Obtained From Patient Wound  History Do you currently have one or more open woundso Yes How many open wounds do you currently haveo 2 Approximately how long have you had your woundso 3 weeks Has your wound(s) ever healed and then re-openedo Yes Have you had any lab work done in the past montho Yes Have you tested positive for osteomyelitis (bone infection)o No Have you had any tests for circulation on your legso No Constitutional Symptoms (General Health) Complaints and Symptoms: Negative for: Fever; Chills Eyes Medical History: Positive for: Cataracts - bilateral removal Negative for: Glaucoma; Optic Neuritis Ear/Nose/Mouth/Throat Medical History: Negative for: Chronic sinus problems/congestion; Middle ear problems Hematologic/Lymphatic Medical History: Negative for: Anemia; Hemophilia; Human Immunodeficiency Virus; Lymphedema; Sickle Cell Disease Respiratory Complaints and Symptoms: No Complaints or Symptoms Medical History: Positive for: Asthma Negative for: Aspiration; Chronic Obstructive Pulmonary Disease (COPD); Pneumothorax; Sleep Apnea; Tuberculosis Cardiovascular Complaints and Symptoms: No Complaints or Symptoms Medical History: Positive for: Angina; Arrhythmia; Coronary Artery Disease; Hypertension; Myocardial Infarction - 2001 Negative for: Congestive Heart Failure; Deep Vein Thrombosis; Hypotension; Peripheral Arterial Disease; Peripheral [name], LUNNEY ([id number]) Venous Disease; Phlebitis; Vasculitis Gastrointestinal Medical History: Negative for: Cirrhosis ; Colitis; Crohnos; Hepatitis A; Hepatitis B; Hepatitis C Endocrine Medical History: Negative for: Type I Diabetes; Type II Diabetes Past Medical History Notes: Borderline Genitourinary Medical History: Negative for: End Stage Renal Disease Immunological Medical History: Negative for: Lupus Erythematosus; Raynaudos; Scleroderma Integumentary (Skin) Medical History: Negative for: History of Burn; History of pressure  wounds Musculoskeletal Medical History: Positive for: Osteoarthritis Negative for: Gout; Rheumatoid Arthritis; Osteomyelitis Neurologic Medical History: Positive for: Dementia; Neuropathy Negative for: Quadriplegia; Paraplegia; Seizure Disorder Oncologic Medical History: Negative for: Received Chemotherapy; Received Radiation Past Medical History Notes: Melanoma on back Psychiatric Complaints and Symptoms: No Complaints or Symptoms Medical History: Negative for: Anorexia/bulimia; Confinement Anxiety HBO Extended History Items Eyes: Cataracts KESHAV, WINEGAR ([id number]) Immunizations Pneumococcal Vaccine: Received Pneumococcal Vaccination:  Yes Implantable Devices Hospitalization / Surgery History Name of Hospital Purpose of Hospitalization/Surgery Date [facility] 01/20/2018 Family and Social History Cancer: Yes - Paternal Grandparents; Diabetes: Yes - Father; Heart Disease: Yes - Mother,Father; Hypertension: No; Kidney Disease: No; Lung Disease: No; Seizures: No; Stroke: Yes - Father; Thyroid Problems: No; Tuberculosis: No; Never smoker; Marital Status - Widowed; Alcohol Use: Never; Drug Use: No History; Caffeine Use: Daily; Financial Concerns: No; Food, Clothing or Shelter Needs: No; Support System Lacking: No; Transportation Concerns: No; Advanced Directives: Yes (Not Provided); Patient does not want information on Advanced Directives; Do not resuscitate: Yes (Not Provided); Living Will: Yes (Not Provided); Medical Power of Attorney: Yes - Nikolai Wilczak (Not Provided) Physician Affirmation I have reviewed and agree with the above information. Electronic Signature(s) Signed: 07/23/2018 5:00:41 PM By: Worthy Keeler PA-C Signed: 07/24/2018 1:28:26 PM By: Roger Shelter Entered By: Worthy Keeler on 07/23/2018 16:40:22 [name] ([id number]) -------------------------------------------------------------------------------- SuperBill Details Patient Name: [name] Date of Service: 07/23/2018 Medical Record Number: [id number] Patient Account Number: [id number] Date of Birth/Sex: Jan 31, 1931 (84 y.o. M) Treating RN: Roger Shelter Primary Care Provider: Burman Freestone Other Clinician: Referring Provider: Burman Freestone Treating Provider/Extender: [name], Everard Interrante Weeks in Treatment: 23 Diagnosis Coding ICD-10 Codes Code Description L89.154 Pressure ulcer of sacral region, stage 4 L24.0 Irritant contact dermatitis due to detergents Facility Procedures CPT4 Code: [id number] Description: [phone] - WOUND CARE VISIT-LEV 2 EST PT Modifier: Quantity: 1 Physician Procedures CPT4 Code: [id number] Description: [id number] - WC PHYS LEVEL 4 - EST PT ICD-10 Diagnosis Description L89.154 Pressure ulcer of sacral region, stage 4 L24.0 Irritant contact dermatitis due to detergents Modifier: Quantity: 1 Electronic Signature(s) Signed: 07/23/2018 5:00:41 PM By: Worthy Keeler PA-C Previous Signature: 07/23/2018 4:35:19 PM Version By: Roger Shelter Entered By: Worthy Keeler on 07/23/2018 16:42:02

## 2018-07-26 NOTE — Progress Notes (Signed)
[name], [name] ([id number]) Visit Report for 07/23/2018 Allergy List Details Patient Name: [name]. Date of Service: 07/23/2018 1:30 PM Medical Record Number: [id number] Patient Account Number: [id number] Date of Birth/Sex: 08-Apr-1931 (84 y.o. M) Treating RN: Montey Hora Primary Care Reyes Fifield: Burman Freestone Other Clinician: Referring Briaunna Grindstaff: Burman Freestone Treating Dayja Loveridge/Extender: [name], HOYT Weeks in Treatment: 23 Allergies Active Allergies Lyrica gabapentin Sulfa (Sulfonamide Antibiotics) Iodinated Contrast- Oral and IV Dye budesonide iohexol simvastatin Demerol meperidine adhesive tape naproxen pregabalin Allergy Notes Electronic Signature(s) Signed: 07/23/2018 4:37:12 PM By: Montey Hora Entered By: Montey Hora on 07/23/2018 14:44:56 [name] ([id number]) -------------------------------------------------------------------------------- Arrival Information Details Patient Name: [name] Date of Service: 07/23/2018 1:30 PM Medical Record Number: [id number] Patient Account Number: [id number] Date of Birth/Sex: 1931/05/27 (84 y.o. M) Treating RN: Roger Shelter Primary Care Sabriel Borromeo: Burman Freestone Other Clinician: Referring Conita Amenta: Burman Freestone Treating Khadeeja Elden/Extender: [name], HOYT Weeks in Treatment: 23 Visit Information Patient Arrived: Ambulatory Arrival Time: 13:39 Accompanied By: wife Transfer Assistance: None Patient Identification Verified: Yes Secondary Verification Process Completed: Yes Patient Requires Transmission-Based No Precautions: Patient Has Alerts: Yes History Since Last Visit Added or deleted any medications: No Any new allergies or adverse reactions: No Had a fall or experienced change in activities of daily living that may affect risk of falls: No Signs or symptoms of abuse/neglect since last visito No Hospitalized since last visit: No Implantable device outside of the clinic  excluding cellular tissue based products placed in the center since last visit: No Has Dressing in Place as Prescribed: Yes Pain Present Now: No Electronic Signature(s) Signed: 07/23/2018 4:30:19 PM By: Lorine Bears RCP, RRT, CHT Entered By: Lorine Bears on 07/23/2018 13:40:18 [name] ([id number]) -------------------------------------------------------------------------------- Clinic Level of Care Assessment Details Patient Name: [name] Date of Service: 07/23/2018 1:30 PM Medical Record Number: [id number] Patient Account Number: [id number] Date of Birth/Sex: October 19, 1930 (84 y.o. M) Treating RN: Roger Shelter Primary Care Quintel Mccalla: Burman Freestone Other Clinician: Referring Chukwudi Ewen: Burman Freestone Treating Emslee Lopezmartinez/Extender: [name], HOYT Weeks in Treatment: 23 Clinic Level of Care Assessment Items TOOL 4 Quantity Score X - Use when only an EandM is performed on FOLLOW-UP visit 1 0 ASSESSMENTS - Nursing Assessment / Reassessment X - Reassessment of Co-morbidities (includes updates in patient status) 1 10 X- 1 5 Reassessment of Adherence to Treatment Plan ASSESSMENTS - Wound and Skin Assessment / Reassessment X - Simple Wound Assessment / Reassessment - one wound 1 5 []  - 0 Complex Wound Assessment / Reassessment - multiple wounds []  - 0 Dermatologic / Skin Assessment (not related to wound area) ASSESSMENTS - Focused Assessment []  - Circumferential Edema Measurements - multi extremities 0 []  - 0 Nutritional Assessment / Counseling / Intervention []  - 0 Lower Extremity Assessment (monofilament, tuning fork, pulses) []  - 0 Peripheral Arterial Disease Assessment (using hand held doppler) ASSESSMENTS - Ostomy and/or Continence Assessment and Care []  - Incontinence Assessment and Management 0 []  - 0 Ostomy Care Assessment and Management (repouching, etc.) PROCESS - Coordination of Care X - Simple Patient / Family Education  for ongoing care 1 15 []  - 0 Complex (extensive) Patient / Family Education for ongoing care []  - 0 Staff obtains Programmer, systems, Records, Test Results / Process Orders []  - 0 Staff telephones HHA, Nursing Homes / Clarify orders / etc []  - 0 Routine Transfer to another Facility (non-emergent condition) []  - 0 Routine Hospital Admission (non-emergent condition) []  - 0 New Admissions / Biomedical engineer / Ordering NPWT,  Apligraf, etc. []  - 0 Emergency Hospital Admission (emergent condition) X- 1 10 Simple Discharge Coordination RUBEN, MAHLER ([id number]) []  - 0 Complex (extensive) Discharge Coordination PROCESS - Special Needs []  - Pediatric / Minor Patient Management 0 []  - 0 Isolation Patient Management []  - 0 Hearing / Language / Visual special needs []  - 0 Assessment of Community assistance (transportation, D/C planning, etc.) []  - 0 Additional assistance / Altered mentation []  - 0 Support Surface(s) Assessment (bed, cushion, seat, etc.) INTERVENTIONS - Wound Cleansing / Measurement X - Simple Wound Cleansing - one wound 1 5 []  - 0 Complex Wound Cleansing - multiple wounds X- 1 5 Wound Imaging (photographs - any number of wounds) []  - 0 Wound Tracing (instead of photographs) X- 1 5 Simple Wound Measurement - one wound []  - 0 Complex Wound Measurement - multiple wounds INTERVENTIONS - Wound Dressings X - Small Wound Dressing one or multiple wounds 1 10 []  - 0 Medium Wound Dressing one or multiple wounds []  - 0 Large Wound Dressing one or multiple wounds []  - 0 Application of Medications - topical []  - 0 Application of Medications - injection INTERVENTIONS - Miscellaneous []  - External ear exam 0 []  - 0 Specimen Collection (cultures, biopsies, blood, body fluids, etc.) []  - 0 Specimen(s) / Culture(s) sent or taken to Lab for analysis []  - 0 Patient Transfer (multiple staff / Civil Service fast streamer / Similar devices) []  - 0 Simple Staple / Suture removal (25 or  less) []  - 0 Complex Staple / Suture removal (26 or more) []  - 0 Hypo / Hyperglycemic Management (close monitor of Blood Glucose) []  - 0 Ankle / Brachial Index (ABI) - do not check if billed separately X- 1 5 Vital Signs FILIMON, MIRANDA ([id number]) Has the patient been seen at the hospital within the last three years: Yes Total Score: 75 Level Of Care: New/Established - Level 2 Electronic Signature(s) Signed: 07/23/2018 4:35:19 PM By: Roger Shelter Entered By: Roger Shelter on 07/23/2018 14:36:29 [name] ([id number]) -------------------------------------------------------------------------------- Encounter Discharge Information Details Patient Name: [name] Date of Service: 07/23/2018 1:30 PM Medical Record Number: [id number] Patient Account Number: [id number] Date of Birth/Sex: 1931/05/31 (84 y.o. M) Treating RN: Montey Hora Primary Care Kaiana Marion: Burman Freestone Other Clinician: Referring Pamela Intrieri: Burman Freestone Treating Sonali Wivell/Extender: [name], HOYT Weeks in Treatment: 84 Encounter Discharge Information Items Discharge Condition: Stable Ambulatory Status: Cane Discharge Destination: Home Transportation: Private Auto Accompanied By: daughter Schedule Follow-up Appointment: Yes Clinical Summary of Care: Electronic Signature(s) Signed: 07/23/2018 4:37:12 PM By: Montey Hora Entered By: Montey Hora on 07/23/2018 14:46:24 [name] ([id number]) -------------------------------------------------------------------------------- Lower Extremity Assessment Details Patient Name: RAVIN, DENARDO Date of Service: 07/23/2018 1:30 PM Medical Record Number: [id number] Patient Account Number: [id number] Date of Birth/Sex: 06-Aug-1931 (84 y.o. M) Treating RN: Secundino Ginger Primary Care Royce Stegman: Burman Freestone Other Clinician: Referring Siniya Lichty: Burman Freestone Treating Arval Brandstetter/Extender: [name], HOYT Weeks in Treatment: 23 Electronic  Signature(s) Signed: 07/23/2018 3:11:37 PM By: Secundino Ginger Entered By: Secundino Ginger on 07/23/2018 13:46:43 [name] ([id number]) -------------------------------------------------------------------------------- Multi Wound Chart Details Patient Name: [name] Date of Service: 07/23/2018 1:30 PM Medical Record Number: [id number] Patient Account Number: [id number] Date of Birth/Sex: [birth year] (84 y.o. M) Treating RN: Roger Shelter Primary Care Adalena Abdulla: Burman Freestone Other Clinician: Referring Aerika Groll: Burman Freestone Treating Candiss Galeana/Extender: [name], HOYT Weeks in Treatment: 23 Vital Signs Height(in): 69 Pulse(bpm): 52 Weight(lbs): 155 Blood Pressure(mmHg): 159/62 Body Mass Index(BMI): 23 Temperature(F): 98.2 Respiratory Rate 18 (breaths/min):  Photos: [1:No Photos] [N/A:N/A] Wound Location: [1:Sacrum] [N/A:N/A] Wounding Event: [1:Pressure Injury] [N/A:N/A] Primary Etiology: [1:Pressure Ulcer] [N/A:N/A] Comorbid History: [1:Cataracts, Asthma, Angina, Arrhythmia, Coronary Artery Disease, Hypertension, Myocardial Infarction, Osteoarthritis, Dementia, Neuropathy] [N/A:N/A] Date Acquired: [1:01/17/2018] [N/A:N/A] Weeks of Treatment: [1:23] [N/A:N/A] Wound Status: [1:Open] [N/A:N/A] Measurements L x W x D [1:1.5x0.4x2] [N/A:N/A] (cm) Area (cm) : [1:0.471] [N/A:N/A] Volume (cm) : [1:0.942] [N/A:N/A] % Reduction in Area: [1:97.70%] [N/A:N/A] % Reduction in Volume: [1:97.70%] [N/A:N/A] Classification: [1:Category/Stage III] [N/A:N/A] Exudate Amount: [1:Small] [N/A:N/A] Exudate Type: [1:Purulent] [N/A:N/A] Exudate Color: [1:yellow, brown, green] [N/A:N/A] Wound Margin: [1:Distinct, outline attached] [N/A:N/A] Granulation Amount: [1:None Present (0%)] [N/A:N/A] Necrotic Amount: [1:Small (1-33%)] [N/A:N/A] Necrotic Tissue: [1:Eschar, Adherent Slough] [N/A:N/A] Exposed Structures: [1:Fat Layer (Subcutaneous Tissue) Exposed: Yes Fascia: No Tendon: No Muscle:  No Joint: No Bone: No] [N/A:N/A] Epithelialization: [1:None] [N/A:N/A] Periwound Skin Texture: [1:Excoriation: Yes Induration: No] [N/A:N/A] Callus: No Crepitus: No Rash: No Scarring: No Periwound Skin Moisture: Maceration: No N/A N/A Dry/Scaly: No Periwound Skin Color: Erythema: Yes N/A N/A Atrophie Blanche: No Cyanosis: No Ecchymosis: No Hemosiderin Staining: No Mottled: No Pallor: No Rubor: No Erythema Location: Circumferential N/A N/A Temperature: No Abnormality N/A N/A Tenderness on Palpation: Yes N/A N/A Wound Preparation: Ulcer Cleansing: N/A N/A Rinsed/Irrigated with Saline Topical Anesthetic Applied: Other: lidocaine 4% Treatment Notes Electronic Signature(s) Signed: 07/23/2018 4:35:19 PM By: Roger Shelter Entered By: Roger Shelter on 07/23/2018 14:25:48 [name] ([id number]) -------------------------------------------------------------------------------- [place] Details Patient Name: CHARLOTTE, BRAFFORD. Date of Service: 07/23/2018 1:30 PM Medical Record Number: [id number] Patient Account Number: [id number] Date of Birth/Sex: 1931/07/23 (84 y.o. M) Treating RN: Roger Shelter Primary Care Mariyah Upshaw: Burman Freestone Other Clinician: Referring Muhannad Bignell: Burman Freestone Treating Briany Aye/Extender: [name], HOYT Weeks in Treatment: 23 Active Inactive ` Abuse / Safety / Falls / Self Care Management Nursing Diagnoses: History of Falls Goals: Patient will not experience any injury related to falls Date Initiated: 02/06/2018 Target Resolution Date: 03/08/2018 Goal Status: Active Interventions: Assess fall risk on admission and as needed Treatment Activities: Patient referred to home care : 02/06/2018 Notes: ` Necrotic Tissue Nursing Diagnoses: Impaired tissue integrity related to necrotic/devitalized tissue Goals: Necrotic/devitalized tissue will be minimized in the wound bed Date Initiated: 02/06/2018 Target Resolution  Date: 03/08/2018 Goal Status: Active Interventions: Assess patient pain level pre-, during and post procedure and prior to discharge Treatment Activities: Excisional debridement : 02/06/2018 Notes: ` Nutrition Nursing Diagnoses: Potential for alteratiion in Nutrition/Potential for imbalanced nutrition CEDRIK, HEINDL ([id number]) Goals: Patient/caregiver agrees to and verbalizes understanding of need to obtain nutritional consultation Date Initiated: 02/06/2018 Target Resolution Date: 03/08/2018 Goal Status: Active Interventions: Provide education on nutrition Notes: ` Orientation to the Wound Care Program Nursing Diagnoses: Knowledge deficit related to the wound healing center program Goals: Patient/caregiver will verbalize understanding of the [facility] Date Initiated: 02/06/2018 Target Resolution Date: 03/08/2018 Goal Status: Active Interventions: Provide education on orientation to the wound center Notes: ` Pressure Nursing Diagnoses: Knowledge deficit related to management of pressures ulcers Goals: Patient/caregiver will verbalize understanding of pressure ulcer management Date Initiated: 02/06/2018 Target Resolution Date: 03/08/2018 Goal Status: Active Interventions: Assess offloading mechanisms upon admission and as needed Provide education on pressure ulcers Notes: ` Wound/Skin Impairment Nursing Diagnoses: Knowledge deficit related to ulceration/compromised skin integrity Goals: Ulcer/skin breakdown will have a volume reduction of 80% by week 12 Date Initiated: 02/06/2018 Target Resolution Date: 04/08/2018 Goal Status: Active DEMONI, GERGEN ([id number]) Interventions: Assess ulceration(s) every visit Treatment Activities: Skin care regimen initiated : 02/06/2018  Topical wound management initiated : 02/06/2018 Notes: Electronic Signature(s) Signed: 07/23/2018 4:35:19 PM By: Roger Shelter Entered By: Roger Shelter on 07/23/2018  14:25:39 [name] ([id number]) -------------------------------------------------------------------------------- Pain Assessment Details Patient Name: [name] Date of Service: 07/23/2018 1:30 PM Medical Record Number: [id number] Patient Account Number: [id number] Date of Birth/Sex: February 08, 1931 (84 y.o. M) Treating RN: Roger Shelter Primary Care Sheridyn Canino: Burman Freestone Other Clinician: Referring Sola Margolis: Burman Freestone Treating Larrie Lucia/Extender: [name], HOYT Weeks in Treatment: 23 Active Problems Location of Pain Severity and Description of Pain Patient Has Paino No Site Locations Pain Management and Medication Current Pain Management: Electronic Signature(s) Signed: 07/23/2018 4:30:19 PM By: Lorine Bears RCP, RRT, CHT Signed: 07/23/2018 4:35:19 PM By: Roger Shelter Entered By: Lorine Bears on 07/23/2018 13:40:31 [name] ([id number]) -------------------------------------------------------------------------------- Patient/Caregiver Education Details Patient Name: ARIAN, MURLEY. Date of Service: 07/23/2018 1:30 PM Medical Record Number: [id number] Patient Account Number: [id number] Date of Birth/Gender: Nov 20, 1930 (84 y.o. M) Treating RN: Roger Shelter Primary Care Physician: Burman Freestone Other Clinician: Referring Physician: Burman Freestone Treating Physician/Extender: Sharalyn Ink in Treatment: 65 Education Assessment Education Provided To: Patient Education Topics Provided Wound/Skin Impairment: Handouts: Caring for Your Ulcer Methods: Explain/Verbal Responses: State content correctly Electronic Signature(s) Signed: 07/23/2018 4:35:19 PM By: Roger Shelter Entered By: Roger Shelter on 07/23/2018 14:36:55 [name] ([id number]) -------------------------------------------------------------------------------- Wound Assessment Details Patient Name: [name] Date of  Service: 07/23/2018 1:30 PM Medical Record Number: [id number] Patient Account Number: [id number] Date of Birth/Sex: January 16, 1931 (84 y.o. M) Treating RN: Secundino Ginger Primary Care Avni Traore: Burman Freestone Other Clinician: Referring Deylan Canterbury: Burman Freestone Treating Xee Hollman/Extender: [name], HOYT Weeks in Treatment: 23 Wound Status Wound Number: 1 Primary Pressure Ulcer Etiology: Wound Location: Sacrum Wound Open Wounding Event: Pressure Injury Status: Date Acquired: 01/17/2018 Comorbid Cataracts, Asthma, Angina, Arrhythmia, Weeks Of Treatment: 23 History: Coronary Artery Disease, Hypertension, Clustered Wound: No Myocardial Infarction, Osteoarthritis, Dementia, Neuropathy Photos Photo Uploaded By: Secundino Ginger on 07/23/2018 14:34:17 Wound Measurements Length: (cm) 1.5 % Reduction Width: (cm) 0.4 % Reduction Depth: (cm) 2 Epitheliali Area: (cm) 0.471 Tunneling: Volume: (cm) 0.942 Underminin in Area: 97.7% in Volume: 97.7% zation: None No g: No Wound Description Classification: Category/Stage III Foul Odor Wound Margin: Distinct, outline attached Slough/Fib Exudate Amount: Small Exudate Type: Purulent Exudate Color: yellow, brown, green After Cleansing: No rino Yes Wound Bed Granulation Amount: None Present (0%) Exposed Structure Necrotic Amount: Small (1-33%) Fascia Exposed: No Necrotic Quality: Eschar, Adherent Slough Fat Layer (Subcutaneous Tissue) Exposed: Yes Tendon Exposed: No Muscle Exposed: No Joint Exposed: No Bone Exposed: No ZAMIER, EGGEBRECHT. ([id number]) Periwound Skin Texture Texture Color No Abnormalities Noted: No No Abnormalities Noted: No Callus: No Atrophie Blanche: No Crepitus: No Cyanosis: No Excoriation: Yes Ecchymosis: No Induration: No Erythema: Yes Rash: No Erythema Location: Circumferential Scarring: No Hemosiderin Staining: No Mottled: No Moisture Pallor: No No Abnormalities Noted: No Rubor: No Dry / Scaly:  No Maceration: No Temperature / Pain Temperature: No Abnormality Tenderness on Palpation: Yes Wound Preparation Ulcer Cleansing: Rinsed/Irrigated with Saline Topical Anesthetic Applied: Other: lidocaine 4%, Treatment Notes Wound #1 (Sacrum) 1. Cleansed with: Clean wound with Normal Saline 2. Anesthetic Topical Lidocaine 4% cream to wound bed prior to debridement 3. Peri-wound Care: Skin Prep 4. Dressing Applied: Calcium Alginate with Silver 5. Secondary Dressing Applied Bordered Foam Dressing Notes drawtex over silvercel packing Electronic Signature(s) Signed: 07/23/2018 3:11:37 PM By: Secundino Ginger Entered By: Secundino Ginger on 07/23/2018 13:55:57 [name] ([id number]) -------------------------------------------------------------------------------- Vitals  Details Patient Name: HAYNES, GIANNOTTI. Date of Service: 07/23/2018 1:30 PM Medical Record Number: [id number] Patient Account Number: [id number] Date of Birth/Sex: 1931/02/17 (84 y.o. M) Treating RN: Roger Shelter Primary Care Yahel Fuston: Burman Freestone Other Clinician: Referring Alianys Chacko: Burman Freestone Treating Natacia Chaisson/Extender: [name], HOYT Weeks in Treatment: 23 Vital Signs Time Taken: 13:35 Temperature (F): 98.2 Height (in): 69 Pulse (bpm): 52 Weight (lbs): 155 Respiratory Rate (breaths/min): 18 Body Mass Index (BMI): 22.9 Blood Pressure (mmHg): 159/62 Reference Range: 80 - 120 mg / dl Electronic Signature(s) Signed: 07/23/2018 4:30:19 PM By: Lorine Bears RCP, RRT, CHT Entered By: Lorine Bears on 07/23/2018 13:43:41

## 2018-07-27 ENCOUNTER — Other Ambulatory Visit: Payer: Self-pay | Admitting: Pharmacist

## 2018-08-06 ENCOUNTER — Encounter: Payer: Medicare Other | Admitting: Physician Assistant

## 2018-08-06 DIAGNOSIS — L89154 Pressure ulcer of sacral region, stage 4: Secondary | ICD-10-CM | POA: Diagnosis not present

## 2018-08-11 NOTE — Progress Notes (Signed)
GILDO, CRISCO ([id number]) Visit Report for 08/06/2018 Chief Complaint Document Details Patient Name: [name], [name]. Date of Service: 08/06/2018 2:00 PM Medical Record Number: [id number] Patient Account Number: [id number] Date of Birth/Sex: [birth year] (84 y.o. M) Treating RN: Cornell Barman Primary Care Provider: Burman Freestone Other Clinician: Referring Provider: Burman Freestone Treating Provider/Extender: [name], HOYT Weeks in Treatment: 25 Information Obtained from: Patient Chief Complaint Sacral pressure ulcer Electronic Signature(s) Signed: 08/10/2018 8:35:57 AM By: Worthy Keeler PA-C Entered By: Worthy Keeler on 08/06/2018 14:47:56 [name], [name] Eves ([id number]) -------------------------------------------------------------------------------- HPI Details Patient Name: [name] Date of Service: 08/06/2018 2:00 PM Medical Record Number: [id number] Patient Account Number: [id number] Date of Birth/Sex: Nov 28, 1930 (84 y.o. M) Treating RN: Cornell Barman Primary Care Provider: Burman Freestone Other Clinician: Referring Provider: Burman Freestone Treating Provider/Extender: [name], HOYT Weeks in Treatment: 25 History of Present Illness HPI Description: 02/06/18 on evaluation today patient presents for initial evaluation and our clinic concerning an issue which began roughly 3 weeks ago when the patient fell in his home on the floor in his kitchen and laid him down this detergent for roughly 3 days. He had a pressure injury to the left shoulder. This unfortunately has caused him a lot of discomfort although it finally seems to be doing better if anything is really having a lot of itching right now. This appears potentially be a contact dermatitis issue. He also has a significant pressure injury to the sacrum at this time as well which is also showing fascia exposure right over the bone but no evidence of bone exposure at this point which is good news. They have been  using Santyl as well as Saline soaked gauze at this point in time. There does appear to be a lot of necrotic slough in the base of the wound. He does have a history of incontinence, myocardial infarction, and hypertension. He also is "borderline diabetic" hemoglobin A1c of 6.0. Currently he has some discomfort in the pressure site at the sacrum but fortunately nothing too significant this did require sharp debridement today. 02/13/18 on evaluation today patient appears to be doing much better in regard to his sacral wound. He has been tolerating the dressing changes without complication with the Vashe. Fortunately there is no evidence of infection and though there is some Slough on the surface of the wound bed he has excellent granulation noted. Overall I'm pleased with how things have progressed in that regard. A glance at his shoulder as well and the rash seems to be someone improving in my pinion at this site as well. Overall I am pleased with what we're seeing and so is the family. 02/20/18 on evaluation today patient appears to be doing a little worse in regard to the sacral wound only in the fact that there is redness surrounding it has me somewhat concerned for infection. The drainage has also apparently been a little bit off color compared to normal according to family they did keep the dressing today that was removed to show me and I agree this seems to be a little bit different compared to what we have been seeing. Coupled with the redness I'm concerned he may be developing some cellulitis surrounding the wound bed. 02/27/18 on evaluation today patient presents for follow-up concerning his sacral ulcer. We have received the results back from his wound culture which shows unfortunately that the doxycycline will not be of benefit for him I am going to need to initiate treatment with something else  in order to treat the pseudomonas. Otherwise he does not seem to be having any significant pain  although his daughter states there are sometimes when he states having pain. We continue to use the Vashe currently. 03/06/18 on evaluation today patient's sacral wound appears to be doing better in my opinion. He has been tolerating the dressing changes without complication. With that being said the silver nitrate has helped with the prominent area of hyper granulation at the 6 o'clock location we will likely need to repeat this again today. Nonetheless overall I am pleased with how things have improved over the last week. The erythema surrounding the wound seems to be greatly improved. 03/13/18 on evaluation today patient's wound actually does not appear to be terribly infected although she does continue to have erythema surrounding the wound bed especially on the left border. I'm still somewhat concerned about the fact that the oral antibiotics alone may not be completely treating his infection. I previously discussed may need to go for IV antibiotic therapy I'm concerned that may be the case. We will need to make a referral today for infectious disease. 03/20/18 on evaluation today the patient sacral wound actually appears to be doing fairly well in regard to granulation although he continues unfortunately to have it your theme is surrounding the periwound region. There's also some increased swelling at the 6 o'clock location which also has me somewhat concerned. With that being said he does have some discomfort but nothing too significant at this point. He still has not heard from infectious disease his daughter and wife are both present during the office visit today they're going to check back with this again. We did get the information for them to call them today. 03/27/18 on evaluation today patient is seen concerning his ongoing sacral ulcer. He has been tolerating the dressing changes without complication. With that being said he does present with evidence of bright green drainage noted on the  dressing which again is something that I do often expect to see with a pseudomonas infection. He continues to have your theme is [name], [name] ([id number]) surrounding the wound bed as well and again I'm not 100% convinced this is just pressure related. I did speak with Colletta Maryland who is the nurse practitioner in [place] with infectious disease. I spoke with her actually yesterday concerning this patient. She is not 100% convinced that this is infected. She question whether the wound may just be colonized with Pseudomonas and not actually causing an active infection. I am really not thinking that the edema is associated with pressure alone and again overall I don't feel that your theme and is consistent with a pressure injury either as he's never had any contusions noted like a deep tissue injury on his heel which was new and I did visualize today this was on the left heel. Nonetheless she wanted to give this a little bit more time and thought it would be appropriate to start the Wound VAC at this point. 04/03/18 on evaluation today patient sacral ulcer actually appears to be doing fairly well at this point. He has been tolerating the dressing changes without complication. With that being said I'm very pleased with the progress that has been made in regard to his sacral wound over the past week I do not see as much in the way of erythema which is great news. Nonetheless he does have a small area of hyper granulation unfortunately. This is at roughly the 7 o'clock location and I  think does need to be addressed so that this will heal more appropriately. Nonetheless I think we may be ready to go ahead and initiate therapy with the Wound VAC. 04/10/18 on evaluation today patient appears to be doing excellent in regard to his sacral ulcer. The show signs of great improvement in overall I'm very pleased with how things look. He has been tolerating the dressing changes without complication.  Specifically this is the Wound VAC. He also seems to be doing well with the antibiotic there is decreased your theme and redness surrounding the sacral area at this point in the wound has filled in quite significantly. 04/17/18 on evaluation today patient actually appears to be doing excellent in regard to his sacral ulcer. He's been tolerating the dressing changes without complication specifically the Wound VAC. There really are no major concerns from the patient nor family this point he is having no pain. He does have a little bit of Epiboly on the lateral portions of the wound where he does have a little bit more depth that will need to be addressed today. 04/23/18 on evaluation today patient's wound actually appears to be doing excellent at this point. He has been tolerating the Wound VAC and this appears to be doing well other than the fact that it seems to be breaking seal at the 6 o'clock location. I do believe that adding a duodenum dressing at this location try and help maintain the seal would be appropriate and likely very effective. With that being said he overall seems to be showing signs of good improvement at this point. There does not appear to be any evidence of significant infection which is also excellent news. 04/30/18 on evaluation today patient actually appears to be doing well in regard to his sacral ulcer. He's been tolerating the dressing changes without complication. Fortunately there does not appear to be any evidence of infection. Overall I'm very pleased with the progress that has been made up to this point. He does have some blistering underneath the draping unfortunately although this is definitely something that has been noted on other patients previously is a fairly common occurrence. Nonetheless the patient seems to be doing fairly well in general in my pinion based on what I see at this time. I do believe these are fairly superficial and minor. 05/07/18 on evaluation  today patient's wound actually appears to be doing excellent at this point in time. He has been tolerating the Wound VAC decently well he states that it is somewhat cumbersome to carry around unfortunately. The only other issue he's been having according to family is that they been having a difficult time keeping the Wound VAC in place and doing what is supposed to do without making. Obviously I do think that this is definitely of concern. Nonetheless I do believe she's made good progress up to this point. I'm very happy in that regard. 05/14/18 on evaluation today patient's wound continues to make good progress at this point. He had a minimal amount of slough noted on the surface which was easily wiped away with saline and gauze and in general I feel like that he is continuing to show excellent progress even with the discontinuation of the Wound VAC. Overall I'm pleased in this regard. He was having issues with the Wound VAC in getting it to seal I think that using the Prisma at this time has been equally efficient and getting the wound to diminish in size. 05/29/18- He is here in follow up  evaluation for a sacral ulcer. There is improvement, we will continue with prisma and he will follow up in two weeks 06/11/18 on evaluation today patient had unfortunately bright green drainage on the dressing upon evaluation today. This is something that we have encountered before although we were able to get things under control previously with antibiotics. With that being said currently upon further inspection of the three areas of hyper granulation that were separate from the actual wound itself which was almost healed it really appears that these all have some depth to them. They are more tunnels that really have not closed or at least have reopened as a result likely of infection in my pinion. This is definitely not what I was expecting or hoping for. HARVY, RIERA ([id number]) 06/26/18 on evaluation  today patient continues to experience issues with what appears to be small abscesses in the sacral region unfortunately. With that being said he has been tolerating the dressing changes without complication which is good news. He's not having any significant discomfort which is also good news. With that being said his daughter states that after he left last week that the packing that we have placed fell out quite rapidly and he subsequently healed over very quick to the point they were not able to even repack the regions. Nonetheless there appear to be several fluctuance areas noted at this point there's one central region that does seem to be draining still discharge that is somewhat green in color. I did review the results of the wound culture which did show evidence of infection with both MRSA as well as pseudomonas based on that result. Nonetheless again with his other current medications we are not able to do the Cipro due to issues with potential long QT syndrome. I am going to give him a prescription for doxycycline in order to help with the potential MRSA infection. 07/09/18 on evaluation today patient actually appears to be doing about the same in regard to his sacral wound. He actually has his MRI scheduled for tomorrow and then subsequently is going to be having his infectious disease appointment for Thursday of this week. Fortunately he's not having any significant discomfort he still has your theme in the sacral region he still has several blister/flux went areas although they technically are not blisters this is more like a underlying abscess. The one area that is open still does probe down to bone. Again I am concerned about a deeper infection possibly even sacral osteomyelitis. 07/23/18 on evaluation today patient actually appears to be doing very well all things considered in regard to his sacral ulcer. Since I've last seen him he actually did have his MRI performed which showed that  he has a complex fluid collection superficial to the distal sacrum measuring 5.9 x 4.4 x 2.8 cm which abuts the posterior aspect of the distal sacrum and the sacrum itself shows cortical destruction consistent with osteomyelitis. She has also been seen by infectious disease and currently orders have been initiated for IV antibiotic therapy for the next eight weeks. He was seen by Janene Madeira NP and placed on Ceftazidime and Daptomycin. Currently he has not really been on this quite long enough to see a sufficient response to the new orders as far as antibiotic therapy is concerned. Nonetheless it does appear that he is likely on the right track at this point which is great news. Nonetheless the question which both Colletta Maryland and myself have discussed both with the patient and between  ourselves is whether or not the patient may need to be seen by surgeon for surgical evacuation of the fluid collection/abscess and possible debridement of the sacrum itself. Nonetheless at this time my personal opinion is probably gonna be that we wait and get this at least a couple weeks to see the response that he receives with the IV antibiotic therapy. 08/06/18 on evaluation today patient actually appears to be doing rather well in regard to his sacral ulcer region all things considered. He has been tolerating the dressing changes without complication. With that being said there is not any obvious opening nor any drainage noted at this point in time upon evaluation. The patient has been tolerating the dressing changes though. He's doing well with the IV antibiotic therapy. Electronic Signature(s) Signed: 08/10/2018 8:35:57 AM By: Worthy Keeler PA-C Entered By: Worthy Keeler on 08/09/2018 10:21:29 [name] ([id number]) -------------------------------------------------------------------------------- Physical Exam Details Patient Name: NIKOLAJ, GERAGHTY Date of Service: 08/06/2018 2:00 PM Medical  Record Number: [id number] Patient Account Number: [id number] Date of Birth/Sex: [birth year] (84 y.o. M) Treating RN: Cornell Barman Primary Care Provider: Burman Freestone Other Clinician: Referring Provider: Burman Freestone Treating Provider/Extender: [name], HOYT Weeks in Treatment: 32 Constitutional Well-nourished and well-hydrated in no acute distress. Respiratory normal breathing without difficulty. clear to auscultation bilaterally. Cardiovascular regular rate and rhythm with normal S1, S2. Psychiatric this patient is able to make decisions and demonstrates good insight into disease process. Alert and Oriented x 3. pleasant and cooperative. Notes Currently the patient seems to be making signs of great improvement as far as the sacral region is concerned I'm not seeing any evidence of issues currently. I'm gonna suggest at this point that we allow him to continue with the IV antibiotic therapy all the wound areas appear to be closing there are no obvious signs of abscess regions. I do not think he needs an obvious referral to general surgery at this point although I would like to keep an eye on him for one more week at least before discharge. Electronic Signature(s) Signed: 08/10/2018 8:35:57 AM By: Worthy Keeler PA-C Entered By: Worthy Keeler on 08/09/2018 10:22:15 [name] ([id number]) -------------------------------------------------------------------------------- Physician Orders Details Patient Name: KEYSTON, ARDOLINO Date of Service: 08/06/2018 2:00 PM Medical Record Number: [id number] Patient Account Number: [id number] Date of Birth/Sex: [birth year] (84 y.o. M) Treating RN: Cornell Barman Primary Care Provider: Burman Freestone Other Clinician: Referring Provider: Burman Freestone Treating Provider/Extender: [name], HOYT Weeks in Treatment: 72 Verbal / Phone Orders: No Diagnosis Coding ICD-10 Coding Code Description L89.154 Pressure ulcer of sacral region,  stage 4 L24.0 Irritant contact dermatitis due to detergents Primary Wound Dressing Wound #1 Sacrum o Other: - Bordered Foam Dressing Dressing Change Frequency Wound #1 Sacrum o Other: - as needed Follow-up Appointments Wound #1 Sacrum o Return Appointment in 2 weeks. Electronic Signature(s) Signed: 08/06/2018 6:05:50 PM By: Gretta Cool, BSN, RN, CWS, Kim RN, BSN Signed: 08/10/2018 8:35:57 AM By: Worthy Keeler PA-C Entered By: Gretta Cool BSN, RN, CWS, Kim on 08/06/2018 15:00:35 JOSEPHMICHAEL, LISENBEE ([id number]) -------------------------------------------------------------------------------- Problem List Details Patient Name: EVIN, LOISEAU Date of Service: 08/06/2018 2:00 PM Medical Record Number: [id number] Patient Account Number: [id number] Date of Birth/Sex: 09-Jul-1931 (84 y.o. M) Treating RN: Cornell Barman Primary Care Provider: Burman Freestone Other Clinician: Referring Provider: Burman Freestone Treating Provider/Extender: [name], HOYT Weeks in Treatment: 25 Active Problems ICD-10 Evaluated Encounter Code Description Active Date Today Diagnosis L89.154 Pressure ulcer of sacral region, stage 4  02/06/2018 No Yes L24.0 Irritant contact dermatitis due to detergents 02/06/2018 No Yes Inactive Problems Resolved Problems Electronic Signature(s) Signed: 08/10/2018 8:35:57 AM By: Worthy Keeler PA-C Entered By: Worthy Keeler on 08/06/2018 14:45:08 [name] ([id number]) -------------------------------------------------------------------------------- Progress Note Details Patient Name: [name] Date of Service: 08/06/2018 2:00 PM Medical Record Number: [id number] Patient Account Number: [id number] Date of Birth/Sex: [birth year] (84 y.o. M) Treating RN: Cornell Barman Primary Care Provider: Burman Freestone Other Clinician: Referring Provider: Burman Freestone Treating Provider/Extender: [name], HOYT Weeks in Treatment: 25 Subjective Chief Complaint Information  obtained from Patient Sacral pressure ulcer History of Present Illness (HPI) 02/06/18 on evaluation today patient presents for initial evaluation and our clinic concerning an issue which began roughly 3 weeks ago when the patient fell in his home on the floor in his kitchen and laid him down this detergent for roughly 3 days. He had a pressure injury to the left shoulder. This unfortunately has caused him a lot of discomfort although it finally seems to be doing better if anything is really having a lot of itching right now. This appears potentially be a contact dermatitis issue. He also has a significant pressure injury to the sacrum at this time as well which is also showing fascia exposure right over the bone but no evidence of bone exposure at this point which is good news. They have been using Santyl as well as Saline soaked gauze at this point in time. There does appear to be a lot of necrotic slough in the base of the wound. He does have a history of incontinence, myocardial infarction, and hypertension. He also is "borderline diabetic" hemoglobin A1c of 6.0. Currently he has some discomfort in the pressure site at the sacrum but fortunately nothing too significant this did require sharp debridement today. 02/13/18 on evaluation today patient appears to be doing much better in regard to his sacral wound. He has been tolerating the dressing changes without complication with the Vashe. Fortunately there is no evidence of infection and though there is some Slough on the surface of the wound bed he has excellent granulation noted. Overall I'm pleased with how things have progressed in that regard. A glance at his shoulder as well and the rash seems to be someone improving in my pinion at this site as well. Overall I am pleased with what we're seeing and so is the family. 02/20/18 on evaluation today patient appears to be doing a little worse in regard to the sacral wound only in the fact that there  is redness surrounding it has me somewhat concerned for infection. The drainage has also apparently been a little bit off color compared to normal according to family they did keep the dressing today that was removed to show me and I agree this seems to be a little bit different compared to what we have been seeing. Coupled with the redness I'm concerned he may be developing some cellulitis surrounding the wound bed. 02/27/18 on evaluation today patient presents for follow-up concerning his sacral ulcer. We have received the results back from his wound culture which shows unfortunately that the doxycycline will not be of benefit for him I am going to need to initiate treatment with something else in order to treat the pseudomonas. Otherwise he does not seem to be having any significant pain although his daughter states there are sometimes when he states having pain. We continue to use the Vashe currently. 03/06/18 on evaluation today patient's sacral wound  appears to be doing better in my opinion. He has been tolerating the dressing changes without complication. With that being said the silver nitrate has helped with the prominent area of hyper granulation at the 6 o'clock location we will likely need to repeat this again today. Nonetheless overall I am pleased with how things have improved over the last week. The erythema surrounding the wound seems to be greatly improved. 03/13/18 on evaluation today patient's wound actually does not appear to be terribly infected although she does continue to have erythema surrounding the wound bed especially on the left border. I'm still somewhat concerned about the fact that the oral antibiotics alone may not be completely treating his infection. I previously discussed may need to go for IV antibiotic therapy I'm concerned that may be the case. We will need to make a referral today for infectious disease. 03/20/18 on evaluation today the patient sacral wound  actually appears to be doing fairly well in regard to granulation although he continues unfortunately to have it your theme is surrounding the periwound region. There's also some increased swelling at the 6 o'clock location which also has me somewhat concerned. With that being said he does have some discomfort but DWAYN, MORAVEK. ([id number]) nothing too significant at this point. He still has not heard from infectious disease his daughter and wife are both present during the office visit today they're going to check back with this again. We did get the information for them to call them today. 03/27/18 on evaluation today patient is seen concerning his ongoing sacral ulcer. He has been tolerating the dressing changes without complication. With that being said he does present with evidence of bright green drainage noted on the dressing which again is something that I do often expect to see with a pseudomonas infection. He continues to have your theme is surrounding the wound bed as well and again I'm not 100% convinced this is just pressure related. I did speak with Colletta Maryland who is the nurse practitioner in [place] with infectious disease. I spoke with her actually yesterday concerning this patient. She is not 100% convinced that this is infected. She question whether the wound may just be colonized with Pseudomonas and not actually causing an active infection. I am really not thinking that the edema is associated with pressure alone and again overall I don't feel that your theme and is consistent with a pressure injury either as he's never had any contusions noted like a deep tissue injury on his heel which was new and I did visualize today this was on the left heel. Nonetheless she wanted to give this a little bit more time and thought it would be appropriate to start the Wound VAC at this point. 04/03/18 on evaluation today patient sacral ulcer actually appears to be doing fairly well at this  point. He has been tolerating the dressing changes without complication. With that being said I'm very pleased with the progress that has been made in regard to his sacral wound over the past week I do not see as much in the way of erythema which is great news. Nonetheless he does have a small area of hyper granulation unfortunately. This is at roughly the 7 o'clock location and I think does need to be addressed so that this will heal more appropriately. Nonetheless I think we may be ready to go ahead and initiate therapy with the Wound VAC. 04/10/18 on evaluation today patient appears to be doing excellent in regard to  his sacral ulcer. The show signs of great improvement in overall I'm very pleased with how things look. He has been tolerating the dressing changes without complication. Specifically this is the Wound VAC. He also seems to be doing well with the antibiotic there is decreased your theme and redness surrounding the sacral area at this point in the wound has filled in quite significantly. 04/17/18 on evaluation today patient actually appears to be doing excellent in regard to his sacral ulcer. He's been tolerating the dressing changes without complication specifically the Wound VAC. There really are no major concerns from the patient nor family this point he is having no pain. He does have a little bit of Epiboly on the lateral portions of the wound where he does have a little bit more depth that will need to be addressed today. 04/23/18 on evaluation today patient's wound actually appears to be doing excellent at this point. He has been tolerating the Wound VAC and this appears to be doing well other than the fact that it seems to be breaking seal at the 6 o'clock location. I do believe that adding a duodenum dressing at this location try and help maintain the seal would be appropriate and likely very effective. With that being said he overall seems to be showing signs of good improvement  at this point. There does not appear to be any evidence of significant infection which is also excellent news. 04/30/18 on evaluation today patient actually appears to be doing well in regard to his sacral ulcer. He's been tolerating the dressing changes without complication. Fortunately there does not appear to be any evidence of infection. Overall I'm very pleased with the progress that has been made up to this point. He does have some blistering underneath the draping unfortunately although this is definitely something that has been noted on other patients previously is a fairly common occurrence. Nonetheless the patient seems to be doing fairly well in general in my pinion based on what I see at this time. I do believe these are fairly superficial and minor. 05/07/18 on evaluation today patient's wound actually appears to be doing excellent at this point in time. He has been tolerating the Wound VAC decently well he states that it is somewhat cumbersome to carry around unfortunately. The only other issue he's been having according to family is that they been having a difficult time keeping the Wound VAC in place and doing what is supposed to do without making. Obviously I do think that this is definitely of concern. Nonetheless I do believe she's made good progress up to this point. I'm very happy in that regard. 05/14/18 on evaluation today patient's wound continues to make good progress at this point. He had a minimal amount of slough noted on the surface which was easily wiped away with saline and gauze and in general I feel like that he is continuing to show excellent progress even with the discontinuation of the Wound VAC. Overall I'm pleased in this regard. He was having issues with the Wound VAC in getting it to seal I think that using the Prisma at this time has been equally efficient and getting the wound to diminish in size. 05/29/18- He is here in follow up evaluation for a sacral ulcer.  There is improvement, we will continue with prisma and he will follow up in two weeks ZAYLON, BOSSIER ([id number]) 06/11/18 on evaluation today patient had unfortunately bright green drainage on the dressing upon evaluation today. This is  something that we have encountered before although we were able to get things under control previously with antibiotics. With that being said currently upon further inspection of the three areas of hyper granulation that were separate from the actual wound itself which was almost healed it really appears that these all have some depth to them. They are more tunnels that really have not closed or at least have reopened as a result likely of infection in my pinion. This is definitely not what I was expecting or hoping for. 06/26/18 on evaluation today patient continues to experience issues with what appears to be small abscesses in the sacral region unfortunately. With that being said he has been tolerating the dressing changes without complication which is good news. He's not having any significant discomfort which is also good news. With that being said his daughter states that after he left last week that the packing that we have placed fell out quite rapidly and he subsequently healed over very quick to the point they were not able to even repack the regions. Nonetheless there appear to be several fluctuance areas noted at this point there's one central region that does seem to be draining still discharge that is somewhat green in color. I did review the results of the wound culture which did show evidence of infection with both MRSA as well as pseudomonas based on that result. Nonetheless again with his other current medications we are not able to do the Cipro due to issues with potential long QT syndrome. I am going to give him a prescription for doxycycline in order to help with the potential MRSA infection. 07/09/18 on evaluation today patient actually appears  to be doing about the same in regard to his sacral wound. He actually has his MRI scheduled for tomorrow and then subsequently is going to be having his infectious disease appointment for Thursday of this week. Fortunately he's not having any significant discomfort he still has your theme in the sacral region he still has several blister/flux went areas although they technically are not blisters this is more like a underlying abscess. The one area that is open still does probe down to bone. Again I am concerned about a deeper infection possibly even sacral osteomyelitis. 07/23/18 on evaluation today patient actually appears to be doing very well all things considered in regard to his sacral ulcer. Since I've last seen him he actually did have his MRI performed which showed that he has a complex fluid collection superficial to the distal sacrum measuring 5.9 x 4.4 x 2.8 cm which abuts the posterior aspect of the distal sacrum and the sacrum itself shows cortical destruction consistent with osteomyelitis. She has also been seen by infectious disease and currently orders have been initiated for IV antibiotic therapy for the next eight weeks. He was seen by Janene Madeira NP and placed on Ceftazidime and Daptomycin. Currently he has not really been on this quite long enough to see a sufficient response to the new orders as far as antibiotic therapy is concerned. Nonetheless it does appear that he is likely on the right track at this point which is great news. Nonetheless the question which both Colletta Maryland and myself have discussed both with the patient and between ourselves is whether or not the patient may need to be seen by surgeon for surgical evacuation of the fluid collection/abscess and possible debridement of the sacrum itself. Nonetheless at this time my personal opinion is probably gonna be that we wait and get  this at least a couple weeks to see the response that he receives with the IV  antibiotic therapy. 08/06/18 on evaluation today patient actually appears to be doing rather well in regard to his sacral ulcer region all things considered. He has been tolerating the dressing changes without complication. With that being said there is not any obvious opening nor any drainage noted at this point in time upon evaluation. The patient has been tolerating the dressing changes though. He's doing well with the IV antibiotic therapy. Patient History Information obtained from Patient. Family History Cancer - Paternal Grandparents, Diabetes - Father, Heart Disease - Mother,Father, Stroke - Father, No family history of Hypertension, Kidney Disease, Lung Disease, Seizures, Thyroid Problems, Tuberculosis. Social History Never smoker, Marital Status - Widowed, Alcohol Use - Never, Drug Use - No History, Caffeine Use - Daily. Medical History Hospitalization/Surgery History - 01/20/2018, [facility], [facility]. Medical And Surgical History Notes Endocrine Borderline SUKHDEEP, WIETING ([id number]) Oncologic Melanoma on back Review of Systems (ROS) Constitutional Symptoms ([place]) Denies complaints or symptoms of Fever, Chills. Respiratory The patient has no complaints or symptoms. Cardiovascular The patient has no complaints or symptoms. Psychiatric The patient has no complaints or symptoms. Objective Constitutional Well-nourished and well-hydrated in no acute distress. Vitals Time Taken: 2:26 PM, Height: 69 in, Weight: 155 lbs, BMI: 22.9, Temperature: 98.6 F, Pulse: 55 bpm, Respiratory Rate: 18 breaths/min, Blood Pressure: 151/65 mmHg. Respiratory normal breathing without difficulty. clear to auscultation bilaterally. Cardiovascular regular rate and rhythm with normal S1, S2. Psychiatric this patient is able to make decisions and demonstrates good insight into disease process. Alert and Oriented x 3. pleasant and cooperative. General Notes: Currently the patient seems to be  making signs of great improvement as far as the sacral region is concerned I'm not seeing any evidence of issues currently. I'm gonna suggest at this point that we allow him to continue with the IV antibiotic therapy all the wound areas appear to be closing there are no obvious signs of abscess regions. I do not think he needs an obvious referral to general surgery at this point although I would like to keep an eye on him for one more week at least before discharge. Integumentary (Hair, Skin) Wound #1 status is Open. Original cause of wound was Pressure Injury. The wound is located on the Sacrum. The wound measures 0.1cm length x 0.1cm width x 0.1cm depth; 0.008cm^2 area and 0.001cm^3 volume. There is Fat Layer (Subcutaneous Tissue) Exposed exposed. There is no tunneling or undermining noted. There is a none present amount of drainage noted. The wound margin is distinct with the outline attached to the wound base. There is no granulation within the wound bed. There is no necrotic tissue within the wound bed. The periwound skin appearance exhibited: Excoriation, Erythema. The periwound skin appearance did not exhibit: Callus, Crepitus, Induration, Rash, Scarring, Dry/Scaly, Maceration, Atrophie Blanche, Cyanosis, Ecchymosis, Hemosiderin Staining, Mottled, Pallor, Rubor. The surrounding wound skin color is noted with erythema which is circumferential. Periwound temperature was noted as No Abnormality. CHRISOPHER, PUSTEJOVSKY ([id number]) Assessment Active Problems ICD-10 Pressure ulcer of sacral region, stage 4 Irritant contact dermatitis due to detergents Plan Primary Wound Dressing: Wound #1 Sacrum: Other: - Bordered Foam Dressing Dressing Change Frequency: Wound #1 Sacrum: Other: - as needed Follow-up Appointments: Wound #1 Sacrum: Return Appointment in 2 weeks. Again I'm gonna see the patient for a one more time visit in order to ensure everything continues to remain healed, closed, and is  doing well.  If she's doing well in two weeks when I see him that we will subsequently discharge him at that point. I'm hopeful that will be the case. The patient and the family are in agreement and happy to hear this as well. Please see above for specific wound care orders. We will see patient for re-evaluation in 2 week(s) here in the clinic. If anything worsens or changes patient will contact our office for additional recommendations. Electronic Signature(s) Signed: 08/10/2018 8:35:57 AM By: Worthy Keeler PA-C Entered By: Worthy Keeler on 08/09/2018 10:22:52 [name] ([id number]) -------------------------------------------------------------------------------- ROS/PFSH Details Patient Name: [name] Date of Service: 08/06/2018 2:00 PM Medical Record Number: [id number] Patient Account Number: [id number] Date of Birth/Sex: 16-Jan-1931 (84 y.o. M) Treating RN: Cornell Barman Primary Care Provider: Burman Freestone Other Clinician: Referring Provider: Burman Freestone Treating Provider/Extender: [name], HOYT Weeks in Treatment: 25 Information Obtained From Patient Wound History Do you currently have one or more open woundso Yes How many open wounds do you currently haveo 2 Approximately how long have you had your woundso 3 weeks Has your wound(s) ever healed and then re-openedo Yes Have you had any lab work done in the past montho Yes Have you tested positive for osteomyelitis (bone infection)o No Have you had any tests for circulation on your legso No Constitutional Symptoms (General Health) Complaints and Symptoms: Negative for: Fever; Chills Eyes Medical History: Positive for: Cataracts - bilateral removal Negative for: Glaucoma; Optic Neuritis Ear/Nose/Mouth/Throat Medical History: Negative for: Chronic sinus problems/congestion; Middle ear problems Hematologic/Lymphatic Medical History: Negative for: Anemia; Hemophilia; Human Immunodeficiency Virus;  Lymphedema; Sickle Cell Disease Respiratory Complaints and Symptoms: No Complaints or Symptoms Medical History: Positive for: Asthma Negative for: Aspiration; Chronic Obstructive Pulmonary Disease (COPD); Pneumothorax; Sleep Apnea; Tuberculosis Cardiovascular Complaints and Symptoms: No Complaints or Symptoms Medical History: Positive for: Angina; Arrhythmia; Coronary Artery Disease; Hypertension; Myocardial Infarction - 2001 Negative for: Congestive Heart Failure; Deep Vein Thrombosis; Hypotension; Peripheral Arterial Disease; Peripheral DAOUD, LOBUE ([id number]) Venous Disease; Phlebitis; Vasculitis Gastrointestinal Medical History: Negative for: Cirrhosis ; Colitis; Crohnos; Hepatitis A; Hepatitis B; Hepatitis C Endocrine Medical History: Negative for: Type I Diabetes; Type II Diabetes Past Medical History Notes: Borderline Genitourinary Medical History: Negative for: End Stage Renal Disease Immunological Medical History: Negative for: Lupus Erythematosus; Raynaudos; Scleroderma Integumentary (Skin) Medical History: Negative for: History of Burn; History of pressure wounds Musculoskeletal Medical History: Positive for: Osteoarthritis Negative for: Gout; Rheumatoid Arthritis; Osteomyelitis Neurologic Medical History: Positive for: Dementia; Neuropathy Negative for: Quadriplegia; Paraplegia; Seizure Disorder Oncologic Medical History: Negative for: Received Chemotherapy; Received Radiation Past Medical History Notes: Melanoma on back Psychiatric Complaints and Symptoms: No Complaints or Symptoms Medical History: Negative for: Anorexia/bulimia; Confinement Anxiety HBO Extended History Items Eyes: Cataracts NASEEM, VARDEN ([id number]) Immunizations Pneumococcal Vaccine: Received Pneumococcal Vaccination: Yes Implantable Devices Hospitalization / Surgery History Name of Hospital Purpose of Hospitalization/Surgery Date [facility] 01/20/2018 Family and  Social History Cancer: Yes - Paternal Grandparents; Diabetes: Yes - Father; Heart Disease: Yes - Mother,Father; Hypertension: No; Kidney Disease: No; Lung Disease: No; Seizures: No; Stroke: Yes - Father; Thyroid Problems: No; Tuberculosis: No; Never smoker; Marital Status - Widowed; Alcohol Use: Never; Drug Use: No History; Caffeine Use: Daily; Financial Concerns: No; Food, Clothing or Shelter Needs: No; Support System Lacking: No; Transportation Concerns: No; Advanced Directives: Yes (Not Provided); Patient does not want information on Advanced Directives; Do not resuscitate: Yes (Not Provided); Living Will: Yes (Not Provided); Medical Power of Attorney: Yes - Wille Aubuchon (  Not Provided) Physician Affirmation I have reviewed and agree with the above information. Electronic Signature(s) Signed: 08/09/2018 6:00:52 PM By: Gretta Cool, BSN, RN, CWS, Kim RN, BSN Signed: 08/10/2018 8:35:57 AM By: Worthy Keeler PA-C Entered By: Worthy Keeler on 08/09/2018 10:21:48 ARLIND, KLINGERMAN ([id number]) -------------------------------------------------------------------------------- SuperBill Details Patient Name: YOUSUF, AGER Date of Service: 08/06/2018 Medical Record Number: [id number] Patient Account Number: [id number] Date of Birth/Sex: [birth year] (84 y.o. M) Treating RN: Cornell Barman Primary Care Provider: Burman Freestone Other Clinician: Referring Provider: Burman Freestone Treating Provider/Extender: [name], HOYT Weeks in Treatment: 25 Diagnosis Coding ICD-10 Codes Code Description L89.154 Pressure ulcer of sacral region, stage 4 L24.0 Irritant contact dermatitis due to detergents Facility Procedures CPT4 Code: 24818590 Description: [phone] - WOUND CARE VISIT-LEV 2 EST PT Modifier: Quantity: 1 Physician Procedures CPT4 Code: 1624469 Description: 50722 - WC PHYS LEVEL 3 - EST PT ICD-10 Diagnosis Description L89.154 Pressure ulcer of sacral region, stage 4 L24.0 Irritant contact dermatitis  due to detergents Modifier: Quantity: 1 Electronic Signature(s) Signed: 08/10/2018 8:35:57 AM By: Worthy Keeler PA-C Entered By: Worthy Keeler on 08/07/2018 10:20:42

## 2018-08-11 NOTE — Progress Notes (Signed)
[name], [name] ([id number]) Visit Report for 08/06/2018 Arrival Information Details Patient Name: [name], [name]. Date of Service: 08/06/2018 2:00 PM Medical Record Number: [id number] Patient Account Number: [id number] Date of Birth/Sex: October 06, 1931 (84 y.o. M) Treating RN: [name] Primary Care [name]: Burman Freestone Other Clinician: Referring [name]: Burman Freestone Treating [name]/Extender: [name], [name] in Treatment: 25 Visit Information History Since Last Visit Added or deleted any medications: No Patient Arrived: Ambulatory Any new allergies or adverse reactions: No Arrival Time: 14:24 Had a fall or experienced change in No Accompanied By: daughter and activities of daily living that may affect wife risk of falls: Transfer Assistance: None Signs or symptoms of abuse/neglect since last visito No Patient Identification Verified: Yes Hospitalized since last visit: No Secondary Verification Process Completed: Yes Implantable device outside of the clinic excluding No Patient Requires Transmission-Based No cellular tissue based products placed in the center Precautions: since last visit: Patient Has Alerts: Yes Has Dressing in Place as Prescribed: Yes Pain Present Now: No Electronic Signature(s) Signed: 08/06/2018 4:57:35 PM By: [name] RCP, RRT, CHT Entered By: [name] on 08/06/2018 14:25:24 [name] ([id number]) -------------------------------------------------------------------------------- Clinic Level of Care Assessment Details Patient Name: [name] Date of Service: 08/06/2018 2:00 PM Medical Record Number: [id number] Patient Account Number: [id number] Date of Birth/Sex: May 18, 1931 (84 y.o. M) Treating RN: [name] Primary Care [name]: Burman Freestone Other Clinician: Referring Kasy Iannacone: Burman Freestone Treating [name]/Extender: [name], [name] in Treatment: 25 Clinic  Level of Care Assessment Items TOOL 4 Quantity Score []  - Use when only an EandM is performed on FOLLOW-UP visit 0 ASSESSMENTS - Nursing Assessment / Reassessment []  - Reassessment of Co-morbidities (includes updates in patient status) 0 X- 1 5 Reassessment of Adherence to Treatment Plan ASSESSMENTS - Wound and Skin Assessment / Reassessment X - Simple Wound Assessment / Reassessment - one wound 1 5 []  - 0 Complex Wound Assessment / Reassessment - multiple wounds []  - 0 Dermatologic / Skin Assessment (not related to wound area) ASSESSMENTS - Focused Assessment []  - Circumferential Edema Measurements - multi extremities 0 []  - 0 Nutritional Assessment / Counseling / Intervention []  - 0 Lower Extremity Assessment (monofilament, tuning fork, pulses) []  - 0 Peripheral Arterial Disease Assessment (using hand held doppler) ASSESSMENTS - Ostomy and/or Continence Assessment and Care []  - Incontinence Assessment and Management 0 []  - 0 Ostomy Care Assessment and Management (repouching, etc.) PROCESS - Coordination of Care X - Simple Patient / Family Education for ongoing care 1 15 []  - 0 Complex (extensive) Patient / Family Education for ongoing care X- 1 10 Staff obtains Programmer, systems, Records, Test Results / Process Orders []  - 0 Staff telephones HHA, Nursing Homes / Clarify orders / etc []  - 0 Routine Transfer to another Facility (non-emergent condition) []  - 0 Routine Hospital Admission (non-emergent condition) []  - 0 New Admissions / Biomedical engineer / Ordering NPWT, Apligraf, etc. []  - 0 Emergency Hospital Admission (emergent condition) X- 1 10 Simple Discharge Coordination [name], [name] ([id number]) []  - 0 Complex (extensive) Discharge Coordination PROCESS - Special Needs []  - Pediatric / Minor Patient Management 0 []  - 0 Isolation Patient Management []  - 0 Hearing / Language / Visual special needs []  - 0 Assessment of Community assistance (transportation, D/C  planning, etc.) []  - 0 Additional assistance / Altered mentation []  - 0 Support Surface(s) Assessment (bed, cushion, seat, etc.) INTERVENTIONS - Wound Cleansing / Measurement X - Simple Wound Cleansing - one wound 1  5 []  - 0 Complex Wound Cleansing - multiple wounds X- 1 5 Wound Imaging (photographs - any number of wounds) []  - 0 Wound Tracing (instead of photographs) X- 1 5 Simple Wound Measurement - one wound []  - 0 Complex Wound Measurement - multiple wounds INTERVENTIONS - Wound Dressings X - Small Wound Dressing one or multiple wounds 1 10 []  - 0 Medium Wound Dressing one or multiple wounds []  - 0 Large Wound Dressing one or multiple wounds []  - 0 Application of Medications - topical []  - 0 Application of Medications - injection INTERVENTIONS - Miscellaneous []  - External ear exam 0 []  - 0 Specimen Collection (cultures, biopsies, blood, body fluids, etc.) []  - 0 Specimen(s) / Culture(s) sent or taken to Lab for analysis []  - 0 Patient Transfer (multiple staff / Civil Service fast streamer / Similar devices) []  - 0 Simple Staple / Suture removal (25 or less) []  - 0 Complex Staple / Suture removal (26 or more) []  - 0 Hypo / Hyperglycemic Management (close monitor of Blood Glucose) []  - 0 Ankle / Brachial Index (ABI) - do not check if billed separately X- 1 5 Vital Signs [name], [name] ([id number]) Has the patient been seen at the hospital within the last three years: Yes Total Score: 75 Level Of Care: New/Established - Level 2 Electronic Signature(s) Signed: 08/06/2018 6:05:50 PM By: [name], BSN, RN, CWS, Kim RN, BSN Entered By: [name], BSN, RN, CWS, [name] on 08/06/2018 15:02:08 [name] ([id number]) -------------------------------------------------------------------------------- Encounter Discharge Information Details Patient Name: [name], [name]. Date of Service: 08/06/2018 2:00 PM Medical Record Number: [id number] Patient Account Number: [id number] Date of  Birth/Sex: 10-12-1931 (84 y.o. M) Treating RN: [name] Primary Care Oseas Detty: Burman Freestone Other Clinician: Referring Riata Ikeda: Burman Freestone Treating Jhovanny Guinta/Extender: [name], [name] in Treatment: 25 Encounter Discharge Information Items Discharge Condition: Stable Ambulatory Status: Wheelchair Discharge Destination: Home Transportation: Private Auto Accompanied By: daughter Schedule Follow-up Appointment: Yes Clinical Summary of Care: Electronic Signature(s) Signed: 08/06/2018 5:29:01 PM By: [name], BSN, RN, CWS, Kim RN, BSN Entered By: [name], BSN, RN, CWS, [name] on 08/06/2018 17:29:01 [name] ([id number]) -------------------------------------------------------------------------------- Lower Extremity Assessment Details Patient Name: [name], [name]. Date of Service: 08/06/2018 2:00 PM Medical Record Number: [id number] Patient Account Number: [id number] Date of Birth/Sex: [birth year] (84 y.o. M) Treating RN: Secundino Ginger Primary Care Deauna Yaw: Burman Freestone Other Clinician: Referring Sanoe Hazan: Burman Freestone Treating Yonael Tulloch/Extender: [name], [name] in Treatment: 25 Electronic Signature(s) Signed: 08/06/2018 4:40:10 PM By: Secundino Ginger Entered By: Secundino Ginger on 08/06/2018 14:35:41 [name] ([id number]) -------------------------------------------------------------------------------- Multi Wound Chart Details Patient Name: [name] Date of Service: 08/06/2018 2:00 PM Medical Record Number: [id number] Patient Account Number: [id number] Date of Birth/Sex: May 27, 1931 (84 y.o. M) Treating RN: [name] Primary Care Shadow Stiggers: Burman Freestone Other Clinician: Referring Sharley Keeler: Burman Freestone Treating Amedio Bowlby/Extender: [name], [name] in Treatment: 25 Vital Signs Height(in): 69 Pulse(bpm): 55 Weight(lbs): 155 Blood Pressure(mmHg): 151/65 Body Mass Index(BMI): 23 Temperature(F): 98.6 Respiratory  Rate 18 (breaths/min): Photos: [1:No Photos] [N/A:N/A] Wound Location: [1:Sacrum] [N/A:N/A] Wounding Event: [1:Pressure Injury] [N/A:N/A] Primary Etiology: [1:Pressure Ulcer] [N/A:N/A] Comorbid History: [1:Cataracts, Asthma, Angina, Arrhythmia, Coronary Artery Disease, Hypertension, Myocardial Infarction, Osteoarthritis, Dementia, Neuropathy] [N/A:N/A] Date Acquired: [1:01/17/2018] [N/A:N/A] [name] of Treatment: [1:25] [N/A:N/A] Wound Status: [1:Open] [N/A:N/A] Measurements L x W x D [1:0.1x0.1x0.1] [N/A:N/A] (cm) Area (cm) : [1:0.008] [N/A:N/A] Volume (cm) : [1:0.001] [N/A:N/A] % Reduction in Area: [1:100.00%] [N/A:N/A] % Reduction in Volume: [1:100.00%] [N/A:N/A] Classification: [1:Category/Stage III] [N/A:N/A] Exudate  Amount: [1:None Present] [N/A:N/A] Wound Margin: [1:Distinct, outline attached] [N/A:N/A] Granulation Amount: [1:None Present (0%)] [N/A:N/A] Necrotic Amount: [1:None Present (0%)] [N/A:N/A] Exposed Structures: [1:Fat Layer (Subcutaneous Tissue) Exposed: Yes Fascia: No Tendon: No Muscle: No Joint: No Bone: No] [N/A:N/A] Epithelialization: [1:None] [N/A:N/A] Periwound Skin Texture: [1:Excoriation: Yes Induration: No Callus: No Crepitus: No] [N/A:N/A] Rash: No Scarring: No Periwound Skin Moisture: Maceration: No N/A N/A Dry/Scaly: No Periwound Skin Color: Erythema: Yes N/A N/A Atrophie Blanche: No Cyanosis: No Ecchymosis: No Hemosiderin Staining: No Mottled: No Pallor: No Rubor: No Erythema Location: Circumferential N/A N/A Temperature: No Abnormality N/A N/A Tenderness on Palpation: No N/A N/A Wound Preparation: Ulcer Cleansing: N/A N/A Rinsed/Irrigated with Saline Topical Anesthetic Applied: Other: lidocaine 4% Treatment Notes Electronic Signature(s) Signed: 08/06/2018 6:05:50 PM By: [name], BSN, RN, CWS, Kim RN, BSN Entered By: [name], BSN, RN, CWS, [name] on 08/06/2018 14:57:26 [name]  ([id number]) -------------------------------------------------------------------------------- Multi-Disciplinary Care Plan Details Patient Name: [name], [name]. Date of Service: 08/06/2018 2:00 PM Medical Record Number: [id number] Patient Account Number: [id number] Date of Birth/Sex: 1930-11-02 (84 y.o. M) Treating RN: [name] Primary Care Jeanise Durfey: Burman Freestone Other Clinician: Referring Cathryn Gallery: Burman Freestone Treating Rudene Poulsen/Extender: [name], [name] in Treatment: 25 Active Inactive ` Abuse / Safety / Falls / Self Care Management Nursing Diagnoses: History of Falls Goals: Patient will not experience any injury related to falls Date Initiated: 02/06/2018 Target Resolution Date: 03/08/2018 Goal Status: Active Interventions: Assess fall risk on admission and as needed Treatment Activities: Patient referred to home care : 02/06/2018 Notes: ` Necrotic Tissue Nursing Diagnoses: Impaired tissue integrity related to necrotic/devitalized tissue Goals: Necrotic/devitalized tissue will be minimized in the wound bed Date Initiated: 02/06/2018 Target Resolution Date: 03/08/2018 Goal Status: Active Interventions: Assess patient pain level pre-, during and post procedure and prior to discharge Treatment Activities: Excisional debridement : 02/06/2018 Notes: ` Nutrition Nursing Diagnoses: Potential for alteratiion in Nutrition/Potential for imbalanced nutrition JARREN, PARA ([id number]) Goals: Patient/caregiver agrees to and verbalizes understanding of need to obtain nutritional consultation Date Initiated: 02/06/2018 Target Resolution Date: 03/08/2018 Goal Status: Active Interventions: Provide education on nutrition Notes: ` Orientation to the Wound Care Program Nursing Diagnoses: Knowledge deficit related to the wound healing center program Goals: Patient/caregiver will verbalize understanding of the [place] Program Date Initiated:  02/06/2018 Target Resolution Date: 03/08/2018 Goal Status: Active Interventions: Provide education on orientation to the wound center Notes: ` Pressure Nursing Diagnoses: Knowledge deficit related to management of pressures ulcers Goals: Patient/caregiver will verbalize understanding of pressure ulcer management Date Initiated: 02/06/2018 Target Resolution Date: 03/08/2018 Goal Status: Active Interventions: Assess offloading mechanisms upon admission and as needed Provide education on pressure ulcers Notes: ` Wound/Skin Impairment Nursing Diagnoses: Knowledge deficit related to ulceration/compromised skin integrity Goals: Ulcer/skin breakdown will have a volume reduction of 80% by week 12 Date Initiated: 02/06/2018 Target Resolution Date: 04/08/2018 Goal Status: Active [name], [name] ([id number]) Interventions: Assess ulceration(s) every visit Treatment Activities: Skin care regimen initiated : 02/06/2018 Topical wound management initiated : 02/06/2018 Notes: Electronic Signature(s) Signed: 08/06/2018 6:05:50 PM By: [name], BSN, RN, CWS, Kim RN, BSN Entered By: [name], BSN, RN, CWS, [name] on 08/06/2018 14:57:08 [name] ([id number]) -------------------------------------------------------------------------------- Pain Assessment Details Patient Name: [name], [name]. Date of Service: 08/06/2018 2:00 PM Medical Record Number: [id number] Patient Account Number: [id number] Date of Birth/Sex: 13-Jun-1931 (84 y.o. M) Treating RN: [name] Primary Care Maximillian Habibi: Burman Freestone Other Clinician: Referring Makila Colombe: Burman Freestone Treating Giannis Corpuz/Extender: [name], [name] in Treatment: 25  Active Problems Location of Pain Severity and Description of Pain Patient Has Paino No Site Locations Pain Management and Medication Current Pain Management: Electronic Signature(s) Signed: 08/06/2018 4:57:35 PM By: [name] RCP, RRT, CHT Signed:  08/06/2018 6:05:50 PM By: [name], BSN, RN, CWS, Kim RN, BSN Entered By: [name] on 08/06/2018 14:25:42 [name] ([id number]) -------------------------------------------------------------------------------- Patient/Caregiver Education Details Patient Name: [name], [name] Date of Service: 08/06/2018 2:00 PM Medical Record Number: [id number] Patient Account Number: [id number] Date of Birth/Gender: 22-Oct-1930 (84 y.o. M) Treating RN: [name] Primary Care Physician: Burman Freestone Other Clinician: Referring Physician: Burman Freestone Treating Physician/Extender: Sharalyn Ink in Treatment: 25 Education Assessment Education Provided To: Patient Education Topics Provided Wound/Skin Impairment: Handouts: Caring for Your Ulcer Methods: Demonstration, Explain/Verbal Responses: State content correctly Electronic Signature(s) Signed: 08/06/2018 6:05:50 PM By: [name], BSN, RN, CWS, Kim RN, BSN Entered By: [name], BSN, RN, CWS, [name] on 08/06/2018 15:02:41 [name] ([id number]) -------------------------------------------------------------------------------- Wound Assessment Details Patient Name: [name], [name]. Date of Service: 08/06/2018 2:00 PM Medical Record Number: [id number] Patient Account Number: [id number] Date of Birth/Sex: 07-29-1931 (84 y.o. M) Treating RN: Secundino Ginger Primary Care Cael Worth: Burman Freestone Other Clinician: Referring Jermario Kalmar: Burman Freestone Treating Shonya Sumida/Extender: [name], [name] in Treatment: 25 Wound Status Wound Number: 1 Primary Pressure Ulcer Etiology: Wound Location: Sacrum Wound Open Wounding Event: Pressure Injury Status: Date Acquired: 01/17/2018 Comorbid Cataracts, Asthma, Angina, Arrhythmia, [name] Of Treatment: 25 History: Coronary Artery Disease, Hypertension, Clustered Wound: No Myocardial Infarction, Osteoarthritis, Dementia, Neuropathy Photos Photo Uploaded By: Secundino Ginger on  08/06/2018 14:57:41 Wound Measurements Length: (cm) 0.1 % Reductio Width: (cm) 0.1 % Reductio Depth: (cm) 0.1 Epithelial Area: (cm) 0.008 Tunneling Volume: (cm) 0.001 Undermini n in Area: 100% n in Volume: 100% ization: None : No ng: No Wound Description Classification: Category/Stage III Foul Odor Wound Margin: Distinct, outline attached Slough/Fib Exudate Amount: None Present After Cleansing: No rino No Wound Bed Granulation Amount: None Present (0%) Exposed Structure Necrotic Amount: None Present (0%) Fascia Exposed: No Fat Layer (Subcutaneous Tissue) Exposed: Yes Tendon Exposed: No Muscle Exposed: No Joint Exposed: No Bone Exposed: No Periwound Skin Texture Texture Color CLAYSON, RILING ([id number]) No Abnormalities Noted: No No Abnormalities Noted: No Callus: No Atrophie Blanche: No Crepitus: No Cyanosis: No Excoriation: Yes Ecchymosis: No Induration: No Erythema: Yes Rash: No Erythema Location: Circumferential Scarring: No Hemosiderin Staining: No Mottled: No Moisture Pallor: No No Abnormalities Noted: No Rubor: No Dry / Scaly: No Maceration: No Temperature / Pain Temperature: No Abnormality Wound Preparation Ulcer Cleansing: Rinsed/Irrigated with Saline Topical Anesthetic Applied: Other: lidocaine 4%, Treatment Notes Wound #1 (Sacrum) Notes bordered foam for protection Electronic Signature(s) Signed: 08/06/2018 4:40:10 PM By: Secundino Ginger Entered By: Secundino Ginger on 08/06/2018 14:42:23 [name] ([id number]) -------------------------------------------------------------------------------- Vitals Details Patient Name: [name] Date of Service: 08/06/2018 2:00 PM Medical Record Number: [id number] Patient Account Number: [id number] Date of Birth/Sex: [birth year] (84 y.o. M) Treating RN: [name] Primary Care Camyla Camposano: Burman Freestone Other Clinician: Referring Joshue Badal: Burman Freestone Treating Tanaiya Kolarik/Extender: [name],  [name] in Treatment: 25 Vital Signs Time Taken: 14:26 Temperature (F): 98.6 Height (in): 69 Pulse (bpm): 55 Weight (lbs): 155 Respiratory Rate (breaths/min): 18 Body Mass Index (BMI): 22.9 Blood Pressure (mmHg): 151/65 Reference Range: 80 - 120 mg / dl Electronic Signature(s) Signed: 08/06/2018 4:57:35 PM By: [name] RCP, RRT, CHT Entered By: Becky Sax, Amado Nash on 08/06/2018 14:28:47

## 2018-08-20 ENCOUNTER — Encounter: Payer: Medicare Other | Attending: Physician Assistant | Admitting: Physician Assistant

## 2018-08-20 ENCOUNTER — Emergency Department: Payer: Medicare Other

## 2018-08-20 ENCOUNTER — Other Ambulatory Visit: Payer: Self-pay

## 2018-08-20 ENCOUNTER — Encounter: Payer: Self-pay | Admitting: Intensive Care

## 2018-08-20 ENCOUNTER — Emergency Department
Admission: EM | Admit: 2018-08-20 | Discharge: 2018-08-20 | Disposition: A | Discharge status: Home / Self Care | Payer: Medicare Other | Attending: Emergency Medicine | Admitting: Emergency Medicine

## 2018-08-20 ENCOUNTER — Telehealth: Payer: Self-pay | Admitting: *Deleted

## 2018-08-20 DIAGNOSIS — L240 Irritant contact dermatitis due to detergents: Secondary | ICD-10-CM | POA: Diagnosis not present

## 2018-08-20 DIAGNOSIS — Z79899 Other long term (current) drug therapy: Secondary | ICD-10-CM | POA: Insufficient documentation

## 2018-08-20 DIAGNOSIS — Z452 Encounter for adjustment and management of vascular access device: Secondary | ICD-10-CM | POA: Diagnosis present

## 2018-08-20 DIAGNOSIS — J45909 Unspecified asthma, uncomplicated: Secondary | ICD-10-CM | POA: Insufficient documentation

## 2018-08-20 DIAGNOSIS — I252 Old myocardial infarction: Secondary | ICD-10-CM | POA: Diagnosis not present

## 2018-08-20 DIAGNOSIS — F0390 Unspecified dementia without behavioral disturbance: Secondary | ICD-10-CM | POA: Insufficient documentation

## 2018-08-20 DIAGNOSIS — L89154 Pressure ulcer of sacral region, stage 4: Secondary | ICD-10-CM | POA: Insufficient documentation

## 2018-08-20 DIAGNOSIS — I4891 Unspecified atrial fibrillation: Secondary | ICD-10-CM | POA: Insufficient documentation

## 2018-08-20 DIAGNOSIS — I10 Essential (primary) hypertension: Secondary | ICD-10-CM | POA: Insufficient documentation

## 2018-08-20 DIAGNOSIS — I2510 Atherosclerotic heart disease of native coronary artery without angina pectoris: Secondary | ICD-10-CM | POA: Insufficient documentation

## 2018-08-20 DIAGNOSIS — Z85820 Personal history of malignant melanoma of skin: Secondary | ICD-10-CM | POA: Insufficient documentation

## 2018-08-20 DIAGNOSIS — E785 Hyperlipidemia, unspecified: Secondary | ICD-10-CM | POA: Insufficient documentation

## 2018-08-20 DIAGNOSIS — Z95828 Presence of other vascular implants and grafts: Secondary | ICD-10-CM

## 2018-08-20 NOTE — Telephone Encounter (Signed)
Patient's daughter Chong Sicilian left message in triage at 3:55 stating her father's PICC had been pulled out about 1 inch, needed advice on what to do next.  RN returned the call, noted patient was at the emergency room for evaluation of the same. Confirmed upcoming appointment 11/15 with daughter Chong Sicilian. Landis Gandy, RN

## 2018-08-20 NOTE — Telephone Encounter (Signed)
His chest X-ray reveals that the picc line is still fortunately in a good position. Can continue to use the line. If pulled out any further we can try to stat re-image outpatient to avoid another ER bill for them. Hope they are doing well - can you also get an update as to how his wound is doing if you call them back?   Appreciate you!

## 2018-08-20 NOTE — ED Triage Notes (Addendum)
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 Patient gets three doses a day of antibiotics. Home health nurses come out and change dressings and daughter reports last week a new nurse changed the dressing and pulled the PICC line out about an inch. Patient has been using PICC since. When home health nurse came out to house today she advised family to come have the PICC line checked before using again. Daughter reports the picc line flushes still but with a little more resistance.

## 2018-08-20 NOTE — ED Notes (Signed)
FIRST NURSE NOTE:  Pt has PICC in place, family states it has been pulled out about an inch and would like it to be checked.

## 2018-08-20 NOTE — ED Provider Notes (Signed)
[facility] Emergency Department Provider Note       Time seen: ----------------------------------------- 6:27 PM on 08/20/2018 -----------------------------------------   I have reviewed the triage vital signs and the nursing notes.  HISTORY   Chief Complaint Vascular Access Problem    HPI [name] is a 84 y.o. male with a history of arthritis, atrial fibrillation, coronary artery disease, hyperlipidemia, hypertension who presents to the ED for possible PICC line dysfunction.  Patient has a PICC line and home health nurse M 2 weeks ago the nurse change the dressing incorrectly and excellently pulled the PICC line out about an inch.  Patient is using the PICC line for stage IV sacrum wound positive for MRSA and Pseudomonas.  They have noticed that the PICC line is little bit more difficult to flush.  Past Medical History:  Diagnosis Date  . Arthritis    "hips, back" (06/17/2015)  . Asthma   . Atrial fibrillation ([place]) 01/21/2018  . Chronic chest pain   . Chronic lower back pain   . Coronary artery disease    a. s/p BMS to RCA in 2002; b. s/p cutting balloon POBA ;   c. cath 6/12: oDx 80% (treated with repeat cutting balloon POBA), mLAD 50% with 30-40% at Dx, CFX 30%, pRCA 25% with patent stents;  d.  Lex MV 4/14:  Low Risk - EF 61%, inf scar with peri-infarct ischemia  . Dyspnea    chronic  . Essential hypertension   . GERD (gastroesophageal reflux disease)    h/o esophageal spasm  . GI bleed 03/03/2014  . Headache   . History of blood transfusion    "related to OR"  . History of hiatal hernia   . Hyperlipidemia   . Hypertension   . Melanoma of lower back ([place]) late 1990's  . Memory loss   . Myocardial infarction ([place]) 2001   x 1, confirned 1 possible  . Pre-syncope 07/31/2017    Patient Active Problem List   Diagnosis Date Noted  . Needs peripherally inserted central catheter (PICC) 07/12/2018  . Medication monitoring encounter  03/22/2018  . Malnutrition of moderate degree 01/22/2018  . Sacral decubitus ulcer, stage IV, healing  01/22/2018  . Atrial fibrillation ([place]) 01/21/2018  . Unresponsive episode 01/20/2018  . Elevated troponin 01/20/2018  . Pre-syncope 07/31/2017  . B12 deficiency 06/19/2017  . Essential hypertension   . Bladder outlet obstruction   . Generalized weakness 03/03/2014  . ESOPHAGEAL STRICTURE 09/20/2010  . ORTHOSTATIC DIZZINESS 08/30/2010  . IBS 08/26/2010  . RECTAL BLEEDING 08/26/2010  . RECTAL PAIN 08/26/2010  . ARTHRITIS 08/26/2010  . LOSS OF APPETITE 08/26/2010  . OTHER DYSPHAGIA 08/26/2010  . COLONIC POLYPS, HX OF 08/26/2010  . PALPITATIONS, HX OF 08/18/2010  . PERIPHERAL NEUROPATHY 05/01/2009  . History of myocardial infarction 05/01/2009  . ALLERGIC RHINITIS, SEASONAL 05/01/2009  . DEPRESSION, HX OF 05/01/2009  . Personal history of other diseases of digestive system 05/01/2009  . NEPHROLITHIASIS, HX OF 05/01/2009  . BENIGN PROSTATIC HYPERTROPHY, HX OF 05/01/2009  . LAMINECTOMY, HX OF 05/01/2009  . Hyperlipidemia 10/20/2008  . CAD (coronary artery disease) 10/20/2008  . GERD 10/20/2008    Past Surgical History:  Procedure Laterality Date  . ANTERIOR CERVICAL DECOMP/DISCECTOMY FUSION    . BACK SURGERY  multiple  . CARDIAC CATHETERIZATION     "I've had 17 caths" (06/17/2015)  . CATARACT EXTRACTION W/ INTRAOCULAR LENS  IMPLANT, BILATERAL Bilateral   . CERVICAL DISC SURGERY  multiple  .  COLONOSCOPY WITH PROPOFOL N/A 04/17/2014   Procedure: COLONOSCOPY WITH PROPOFOL;  Surgeon: Winfield Cunas., MD;  Location: [facility] ENDOSCOPY;  Service: Endoscopy;  Laterality: N/A;  . CORONARY ANGIOPLASTY WITH STENT PLACEMENT  x 2 stents    previous percutaneous intervention on the  RCA and the diagonal branch  . ESOPHAGOGASTRODUODENOSCOPY  03/23/2012   Procedure: ESOPHAGOGASTRODUODENOSCOPY (EGD);  Surgeon: Winfield Cunas., MD;  Location: [facility];  Service: Endoscopy;  Laterality:  N/A;  . ESOPHAGOGASTRODUODENOSCOPY N/A 03/04/2014   Procedure: ESOPHAGOGASTRODUODENOSCOPY (EGD);  Surgeon: Arta Silence, MD;  Location: [facility] ENDOSCOPY;  Service: Endoscopy;  Laterality: N/A;  . LAMINECTOMY    . LEFT HEART CATHETERIZATION WITH CORONARY ANGIOGRAM N/A 01/13/2014   Procedure: LEFT HEART CATHETERIZATION WITH CORONARY ANGIOGRAM;  Surgeon: Blane Ohara, MD;  Location: [facility] CATH LAB;  Service: Cardiovascular;  Laterality: N/A;  . MELANOMA EXCISION  late 1990's   "lower back"  . POSTERIOR LAMINECTOMY / DECOMPRESSION CERVICAL SPINE    . SAVORY DILATION  03/23/2012   Procedure: SAVORY DILATION;  Surgeon: Winfield Cunas., MD;  Location: [facility] ENDOSCOPY;  Service: Endoscopy;  Laterality: N/A;  . TONSILLECTOMY  1930's    Allergies Budesonide-formoterol fumarate; Iohexol; Simvastatin; Demerol [meperidine]; Ivp dye [iodinated diagnostic agents]; Meperidine hcl; Tape; Naproxen; Pregabalin; and Sulfonamide derivatives  Social History Social History   Tobacco Use  . Smoking status: Never Smoker  . Smokeless tobacco: Never Used  Substance Use Topics  . Alcohol use: No  . Drug use: No   Review of Systems Constitutional: Negative for fever. Cardiovascular: Negative for chest pain. Respiratory: Negative for shortness of breath. Skin: Negative for rash. Neurological: Negative for headaches, focal weakness or numbness.  All systems negative/normal/unremarkable except as stated in the HPI  ____________________________________________   PHYSICAL EXAM:  VITAL SIGNS: ED Triage Vitals [08/20/18 1725]  Enc Vitals Group     BP 129/64     Pulse Rate 68     Resp 18     Temp 98.2 F (36.8 C)     Temp Source Oral     SpO2 97 %     Weight 175 lb (79.4 kg)     Height 5\' 6"  (1.676 m)     Head Circumference      Peak Flow      Pain Score 0     Pain Loc      Pain Edu?      Excl. in [place]?    Constitutional: Alert and oriented. Well appearing and in no distress. Cardiovascular: Normal  rate, regular rhythm. No murmurs, rubs, or gallops. Respiratory: Normal respiratory effort without tachypnea nor retractions. Breath sounds are clear and equal bilaterally. No wheezes/rales/rhonchi. Musculoskeletal: Right upper extremity PICC line site appears unremarkable. Neurologic:  Normal speech and language. No gross focal neurologic deficits are appreciated.  Skin:  Skin is warm, dry and intact. No rash noted. Psychiatric: Mood and affect are normal. Speech and behavior are normal.  ____________________________________________  ED COURSE:  As part of my medical decision making, I reviewed the following data within the [place] History obtained from family if available, nursing notes, old chart and ekg, as well as notes from prior ED visits. Patient presented for PICC line dysfunction, we will assess with imaging as indicated at this time.   Procedures ____________________________________________   RADIOLOGY Images were viewed by me  Chest x-ray IMPRESSION: Right upper extremity catheter tip projects over the SVC. ____________________________________________  DIFFERENTIAL DIAGNOSIS   PICC line  dysfunction, normal PICC line  FINAL ASSESSMENT AND PLAN  PICC line evaluation   Plan: The patient had presented for possible PICC line abnormality. Patient's imaging revealed normal PICC line catheter tip.  It flushed easily here without any complication.  He is cleared for outpatient follow-up.   Laurence Aly, MD   Note: This note was generated in part or whole with voice recognition software. Voice recognition is usually quite accurate but there are transcription errors that can and very often do occur. I apologize for any typographical errors that were not detected and corrected.     Earleen Newport, MD 08/20/18 [phone]

## 2018-08-20 NOTE — ED Notes (Signed)
Pt has PICC line and home health nurse comes once a week to change dressing - 2 weeks ago the nurse changed the dressing incorrectly changed the dressing and pulled it out - today the regular home health nurse came out for dressing change and noted that on measurement the PICC line is out 1 inch

## 2018-08-20 NOTE — ED Notes (Signed)
Right arm PICC line flushed.  No resistance.  Flushes easily. Blood return noted.  Patient tolerated well.

## 2018-08-24 NOTE — Telephone Encounter (Signed)
Left message asking if he had any concerns or questions about his picc, asked how his wound was doing. RN asked patient to call back, reminded him of upcoming appointment 11/15 at 9:30.

## 2018-08-25 NOTE — Progress Notes (Signed)
LI, FRAGOSO ([id number]) Visit Report for 08/20/2018 Chief Complaint Document Details Patient Name: [name], [name]. Date of Service: 08/20/2018 2:45 PM Medical Record Number: [id number] Patient Account Number: [id number] Date of Birth/Sex: 1931-06-25 (84 y.o. M) Treating RN: Cornell Barman Primary Care Provider: Burman Freestone Other Clinician: Referring Provider: Burman Freestone Treating Provider/Extender: [name], HOYT Weeks in Treatment: 27 Information Obtained from: Patient Chief Complaint Sacral pressure ulcer Electronic Signature(s) Signed: 08/21/2018 11:39:33 AM By: Worthy Keeler PA-C Entered By: Worthy Keeler on 08/20/2018 14:49:35 [name] ([id number]) -------------------------------------------------------------------------------- HPI Details Patient Name: [name] Date of Service: 08/20/2018 2:45 PM Medical Record Number: [id number] Patient Account Number: [id number] Date of Birth/Sex: 1931/08/09 (84 y.o. M) Treating RN: Cornell Barman Primary Care Provider: Burman Freestone Other Clinician: Referring Provider: Burman Freestone Treating Provider/Extender: [name], HOYT Weeks in Treatment: 27 History of Present Illness HPI Description: 02/06/18 on evaluation today patient presents for initial evaluation and our clinic concerning an issue which began roughly 3 weeks ago when the patient fell in his home on the floor in his kitchen and laid him down this detergent for roughly 3 days. He had a pressure injury to the left shoulder. This unfortunately has caused him a lot of discomfort although it finally seems to be doing better if anything is really having a lot of itching right now. This appears potentially be a contact dermatitis issue. He also has a significant pressure injury to the sacrum at this time as well which is also showing fascia exposure right over the bone but no evidence of bone exposure at this point which is good news. They have been  using Santyl as well as Saline soaked gauze at this point in time. There does appear to be a lot of necrotic slough in the base of the wound. He does have a history of incontinence, myocardial infarction, and hypertension. He also is "borderline diabetic" hemoglobin A1c of 6.0. Currently he has some discomfort in the pressure site at the sacrum but fortunately nothing too significant this did require sharp debridement today. 02/13/18 on evaluation today patient appears to be doing much better in regard to his sacral wound. He has been tolerating the dressing changes without complication with the Vashe. Fortunately there is no evidence of infection and though there is some Slough on the surface of the wound bed he has excellent granulation noted. Overall I'm pleased with how things have progressed in that regard. A glance at his shoulder as well and the rash seems to be someone improving in my pinion at this site as well. Overall I am pleased with what we're seeing and so is the family. 02/20/18 on evaluation today patient appears to be doing a little worse in regard to the sacral wound only in the fact that there is redness surrounding it has me somewhat concerned for infection. The drainage has also apparently been a little bit off color compared to normal according to family they did keep the dressing today that was removed to show me and I agree this seems to be a little bit different compared to what we have been seeing. Coupled with the redness I'm concerned he may be developing some cellulitis surrounding the wound bed. 02/27/18 on evaluation today patient presents for follow-up concerning his sacral ulcer. We have received the results back from his wound culture which shows unfortunately that the doxycycline will not be of benefit for him I am going to need to initiate treatment with something else  in order to treat the pseudomonas. Otherwise he does not seem to be having any significant pain  although his daughter states there are sometimes when he states having pain. We continue to use the Vashe currently. 03/06/18 on evaluation today patient's sacral wound appears to be doing better in my opinion. He has been tolerating the dressing changes without complication. With that being said the silver nitrate has helped with the prominent area of hyper granulation at the 6 o'clock location we will likely need to repeat this again today. Nonetheless overall I am pleased with how things have improved over the last week. The erythema surrounding the wound seems to be greatly improved. 03/13/18 on evaluation today patient's wound actually does not appear to be terribly infected although she does continue to have erythema surrounding the wound bed especially on the left border. I'm still somewhat concerned about the fact that the oral antibiotics alone may not be completely treating his infection. I previously discussed may need to go for IV antibiotic therapy I'm concerned that may be the case. We will need to make a referral today for infectious disease. 03/20/18 on evaluation today the patient sacral wound actually appears to be doing fairly well in regard to granulation although he continues unfortunately to have it your theme is surrounding the periwound region. There's also some increased swelling at the 6 o'clock location which also has me somewhat concerned. With that being said he does have some discomfort but nothing too significant at this point. He still has not heard from infectious disease his daughter and wife are both present during the office visit today they're going to check back with this again. We did get the information for them to call them today. 03/27/18 on evaluation today patient is seen concerning his ongoing sacral ulcer. He has been tolerating the dressing changes without complication. With that being said he does present with evidence of bright green drainage noted on the  dressing which again is something that I do often expect to see with a pseudomonas infection. He continues to have your theme is ACETON, KINNEAR ([id number]) surrounding the wound bed as well and again I'm not 100% convinced this is just pressure related. I did speak with Colletta Maryland who is the nurse practitioner in [place] with infectious disease. I spoke with her actually yesterday concerning this patient. She is not 100% convinced that this is infected. She question whether the wound may just be colonized with Pseudomonas and not actually causing an active infection. I am really not thinking that the edema is associated with pressure alone and again overall I don't feel that your theme and is consistent with a pressure injury either as he's never had any contusions noted like a deep tissue injury on his heel which was new and I did visualize today this was on the left heel. Nonetheless she wanted to give this a little bit more time and thought it would be appropriate to start the Wound VAC at this point. 04/03/18 on evaluation today patient sacral ulcer actually appears to be doing fairly well at this point. He has been tolerating the dressing changes without complication. With that being said I'm very pleased with the progress that has been made in regard to his sacral wound over the past week I do not see as much in the way of erythema which is great news. Nonetheless he does have a small area of hyper granulation unfortunately. This is at roughly the 7 o'clock location and I  think does need to be addressed so that this will heal more appropriately. Nonetheless I think we may be ready to go ahead and initiate therapy with the Wound VAC. 04/10/18 on evaluation today patient appears to be doing excellent in regard to his sacral ulcer. The show signs of great improvement in overall I'm very pleased with how things look. He has been tolerating the dressing changes without complication.  Specifically this is the Wound VAC. He also seems to be doing well with the antibiotic there is decreased your theme and redness surrounding the sacral area at this point in the wound has filled in quite significantly. 04/17/18 on evaluation today patient actually appears to be doing excellent in regard to his sacral ulcer. He's been tolerating the dressing changes without complication specifically the Wound VAC. There really are no major concerns from the patient nor family this point he is having no pain. He does have a little bit of Epiboly on the lateral portions of the wound where he does have a little bit more depth that will need to be addressed today. 04/23/18 on evaluation today patient's wound actually appears to be doing excellent at this point. He has been tolerating the Wound VAC and this appears to be doing well other than the fact that it seems to be breaking seal at the 6 o'clock location. I do believe that adding a duodenum dressing at this location try and help maintain the seal would be appropriate and likely very effective. With that being said he overall seems to be showing signs of good improvement at this point. There does not appear to be any evidence of significant infection which is also excellent news. 04/30/18 on evaluation today patient actually appears to be doing well in regard to his sacral ulcer. He's been tolerating the dressing changes without complication. Fortunately there does not appear to be any evidence of infection. Overall I'm very pleased with the progress that has been made up to this point. He does have some blistering underneath the draping unfortunately although this is definitely something that has been noted on other patients previously is a fairly common occurrence. Nonetheless the patient seems to be doing fairly well in general in my pinion based on what I see at this time. I do believe these are fairly superficial and minor. 05/07/18 on evaluation  today patient's wound actually appears to be doing excellent at this point in time. He has been tolerating the Wound VAC decently well he states that it is somewhat cumbersome to carry around unfortunately. The only other issue he's been having according to family is that they been having a difficult time keeping the Wound VAC in place and doing what is supposed to do without making. Obviously I do think that this is definitely of concern. Nonetheless I do believe she's made good progress up to this point. I'm very happy in that regard. 05/14/18 on evaluation today patient's wound continues to make good progress at this point. He had a minimal amount of slough noted on the surface which was easily wiped away with saline and gauze and in general I feel like that he is continuing to show excellent progress even with the discontinuation of the Wound VAC. Overall I'm pleased in this regard. He was having issues with the Wound VAC in getting it to seal I think that using the Prisma at this time has been equally efficient and getting the wound to diminish in size. 05/29/18- He is here in follow up  evaluation for a sacral ulcer. There is improvement, we will continue with prisma and he will follow up in two weeks 06/11/18 on evaluation today patient had unfortunately bright green drainage on the dressing upon evaluation today. This is something that we have encountered before although we were able to get things under control previously with antibiotics. With that being said currently upon further inspection of the three areas of hyper granulation that were separate from the actual wound itself which was almost healed it really appears that these all have some depth to them. They are more tunnels that really have not closed or at least have reopened as a result likely of infection in my pinion. This is definitely not what I was expecting or hoping for. [name], [name] ([id number]) 06/26/18 on evaluation  today patient continues to experience issues with what appears to be small abscesses in the sacral region unfortunately. With that being said he has been tolerating the dressing changes without complication which is good news. He's not having any significant discomfort which is also good news. With that being said his daughter states that after he left last week that the packing that we have placed fell out quite rapidly and he subsequently healed over very quick to the point they were not able to even repack the regions. Nonetheless there appear to be several fluctuance areas noted at this point there's one central region that does seem to be draining still discharge that is somewhat green in color. I did review the results of the wound culture which did show evidence of infection with both MRSA as well as pseudomonas based on that result. Nonetheless again with his other current medications we are not able to do the Cipro due to issues with potential long QT syndrome. I am going to give him a prescription for doxycycline in order to help with the potential MRSA infection. 07/09/18 on evaluation today patient actually appears to be doing about the same in regard to his sacral wound. He actually has his MRI scheduled for tomorrow and then subsequently is going to be having his infectious disease appointment for Thursday of this week. Fortunately he's not having any significant discomfort he still has your theme in the sacral region he still has several blister/flux went areas although they technically are not blisters this is more like a underlying abscess. The one area that is open still does probe down to bone. Again I am concerned about a deeper infection possibly even sacral osteomyelitis. 07/23/18 on evaluation today patient actually appears to be doing very well all things considered in regard to his sacral ulcer. Since I've last seen him he actually did have his MRI performed which showed that  he has a complex fluid collection superficial to the distal sacrum measuring 5.9 x 4.4 x 2.8 cm which abuts the posterior aspect of the distal sacrum and the sacrum itself shows cortical destruction consistent with osteomyelitis. She has also been seen by infectious disease and currently orders have been initiated for IV antibiotic therapy for the next eight weeks. He was seen by Janene Madeira NP and placed on Ceftazidime and Daptomycin. Currently he has not really been on this quite long enough to see a sufficient response to the new orders as far as antibiotic therapy is concerned. Nonetheless it does appear that he is likely on the right track at this point which is great news. Nonetheless the question which both Colletta Maryland and myself have discussed both with the patient and between  ourselves is whether or not the patient may need to be seen by surgeon for surgical evacuation of the fluid collection/abscess and possible debridement of the sacrum itself. Nonetheless at this time my personal opinion is probably gonna be that we wait and get this at least a couple weeks to see the response that he receives with the IV antibiotic therapy. 08/06/18 on evaluation today patient actually appears to be doing rather well in regard to his sacral ulcer region all things considered. He has been tolerating the dressing changes without complication. With that being said there is not any obvious opening nor any drainage noted at this point in time upon evaluation. The patient has been tolerating the dressing changes though. He's doing well with the IV antibiotic therapy. 08/20/18 on evaluation today patient appears to be doing wonderful in regard to his sacral region. In fact there appears to be no wound opening or drainage at this time. Overall I'm very pleased with how things have progressed. The patient likewise as well as his wife and daughter are very happy as well. Electronic Signature(s) Signed:  08/21/2018 11:39:33 AM By: Worthy Keeler PA-C Entered By: Worthy Keeler on 08/20/2018 17:47:20 [name], [name] ([id number]) -------------------------------------------------------------------------------- Physical Exam Details Patient Name: RAYDER, SULLENGER Date of Service: 08/20/2018 2:45 PM Medical Record Number: [id number] Patient Account Number: [id number] Date of Birth/Sex: 1931-09-14 (84 y.o. M) Treating RN: Cornell Barman Primary Care Provider: Burman Freestone Other Clinician: Referring Provider: Burman Freestone Treating Provider/Extender: [name], HOYT Weeks in Treatment: 60 Constitutional Well-nourished and well-hydrated in no acute distress. Respiratory normal breathing without difficulty. clear to auscultation bilaterally. Cardiovascular regular rate and rhythm with normal S1, S2. Psychiatric this patient is able to make decisions and demonstrates good insight into disease process. Alert and Oriented x 3. pleasant and cooperative. Notes Patient's wound bed currently shows evidence of great epithelialization enclosure. Again I did program that does not appear to be any evidence of abscess or fluid collection noted at this time. Overall very pleased with how things have progressed there's no sign of this reopening which is excellent. Electronic Signature(s) Signed: 08/21/2018 11:39:33 AM By: Worthy Keeler PA-C Entered By: Worthy Keeler on 08/20/2018 17:47:51 [name], [name] ([id number]) -------------------------------------------------------------------------------- Physician Orders Details Patient Name: DESHAUN, WEISINGER Date of Service: 08/20/2018 2:45 PM Medical Record Number: [id number] Patient Account Number: [id number] Date of Birth/Sex: 09/02/1931 (84 y.o. M) Treating RN: Cornell Barman Primary Care Provider: Burman Freestone Other Clinician: Referring Provider: Burman Freestone Treating Provider/Extender: [name], HOYT Weeks in Treatment: 25 Verbal / Phone  Orders: No Diagnosis Coding ICD-10 Coding Code Description L89.154 Pressure ulcer of sacral region, stage 4 L24.0 Irritant contact dermatitis due to detergents Discharge From [facility] Services o Discharge from [facility] - treatment complete Electronic Signature(s) Signed: 08/21/2018 11:39:33 AM By: Worthy Keeler PA-C Signed: 08/23/2018 10:02:59 AM By: Gretta Cool, BSN, RN, CWS, Kim RN, BSN Entered By: Gretta Cool, BSN, RN, CWS, Kim on 08/20/2018 15:37:02 [name] ([id number]) -------------------------------------------------------------------------------- Problem List Details Patient Name: NIKIA, MANGINO. Date of Service: 08/20/2018 2:45 PM Medical Record Number: [id number] Patient Account Number: [id number] Date of Birth/Sex: 21-Sep-1931 (84 y.o. M) Treating RN: Cornell Barman Primary Care Provider: Burman Freestone Other Clinician: Referring Provider: Burman Freestone Treating Provider/Extender: [name], HOYT Weeks in Treatment: 27 Active Problems ICD-10 Evaluated Encounter Code Description Active Date Today Diagnosis L89.154 Pressure ulcer of sacral region, stage 4 02/06/2018 No Yes L24.0 Irritant contact dermatitis due to detergents 02/06/2018 No  Yes Inactive Problems Resolved Problems Electronic Signature(s) Signed: 08/21/2018 11:39:33 AM By: Worthy Keeler PA-C Entered By: Worthy Keeler on 08/20/2018 14:49:31 Nalin, Mazzocco [name] ([id number]) -------------------------------------------------------------------------------- Progress Note Details Patient Name: [name] Date of Service: 08/20/2018 2:45 PM Medical Record Number: [id number] Patient Account Number: [id number] Date of Birth/Sex: 02-02-1931 (84 y.o. M) Treating RN: Cornell Barman Primary Care Provider: Burman Freestone Other Clinician: Referring Provider: Burman Freestone Treating Provider/Extender: [name], HOYT Weeks in Treatment: 27 Subjective Chief Complaint Information obtained from Patient Sacral  pressure ulcer History of Present Illness (HPI) 02/06/18 on evaluation today patient presents for initial evaluation and our clinic concerning an issue which began roughly 3 weeks ago when the patient fell in his home on the floor in his kitchen and laid him down this detergent for roughly 3 days. He had a pressure injury to the left shoulder. This unfortunately has caused him a lot of discomfort although it finally seems to be doing better if anything is really having a lot of itching right now. This appears potentially be a contact dermatitis issue. He also has a significant pressure injury to the sacrum at this time as well which is also showing fascia exposure right over the bone but no evidence of bone exposure at this point which is good news. They have been using Santyl as well as Saline soaked gauze at this point in time. There does appear to be a lot of necrotic slough in the base of the wound. He does have a history of incontinence, myocardial infarction, and hypertension. He also is "borderline diabetic" hemoglobin A1c of 6.0. Currently he has some discomfort in the pressure site at the sacrum but fortunately nothing too significant this did require sharp debridement today. 02/13/18 on evaluation today patient appears to be doing much better in regard to his sacral wound. He has been tolerating the dressing changes without complication with the Vashe. Fortunately there is no evidence of infection and though there is some Slough on the surface of the wound bed he has excellent granulation noted. Overall I'm pleased with how things have progressed in that regard. A glance at his shoulder as well and the rash seems to be someone improving in my pinion at this site as well. Overall I am pleased with what we're seeing and so is the family. 02/20/18 on evaluation today patient appears to be doing a little worse in regard to the sacral wound only in the fact that there is redness surrounding it has  me somewhat concerned for infection. The drainage has also apparently been a little bit off color compared to normal according to family they did keep the dressing today that was removed to show me and I agree this seems to be a little bit different compared to what we have been seeing. Coupled with the redness I'm concerned he may be developing some cellulitis surrounding the wound bed. 02/27/18 on evaluation today patient presents for follow-up concerning his sacral ulcer. We have received the results back from his wound culture which shows unfortunately that the doxycycline will not be of benefit for him I am going to need to initiate treatment with something else in order to treat the pseudomonas. Otherwise he does not seem to be having any significant pain although his daughter states there are sometimes when he states having pain. We continue to use the Vashe currently. 03/06/18 on evaluation today patient's sacral wound appears to be doing better in my opinion. He has been tolerating  the dressing changes without complication. With that being said the silver nitrate has helped with the prominent area of hyper granulation at the 6 o'clock location we will likely need to repeat this again today. Nonetheless overall I am pleased with how things have improved over the last week. The erythema surrounding the wound seems to be greatly improved. 03/13/18 on evaluation today patient's wound actually does not appear to be terribly infected although she does continue to have erythema surrounding the wound bed especially on the left border. I'm still somewhat concerned about the fact that the oral antibiotics alone may not be completely treating his infection. I previously discussed may need to go for IV antibiotic therapy I'm concerned that may be the case. We will need to make a referral today for infectious disease. 03/20/18 on evaluation today the patient sacral wound actually appears to be doing fairly  well in regard to granulation although he continues unfortunately to have it your theme is surrounding the periwound region. There's also some increased swelling at the 6 o'clock location which also has me somewhat concerned. With that being said he does have some discomfort but [name], [name]. ([id number]) nothing too significant at this point. He still has not heard from infectious disease his daughter and wife are both present during the office visit today they're going to check back with this again. We did get the information for them to call them today. 03/27/18 on evaluation today patient is seen concerning his ongoing sacral ulcer. He has been tolerating the dressing changes without complication. With that being said he does present with evidence of bright green drainage noted on the dressing which again is something that I do often expect to see with a pseudomonas infection. He continues to have your theme is surrounding the wound bed as well and again I'm not 100% convinced this is just pressure related. I did speak with Colletta Maryland who is the nurse practitioner in [place] with infectious disease. I spoke with her actually yesterday concerning this patient. She is not 100% convinced that this is infected. She question whether the wound may just be colonized with Pseudomonas and not actually causing an active infection. I am really not thinking that the edema is associated with pressure alone and again overall I don't feel that your theme and is consistent with a pressure injury either as he's never had any contusions noted like a deep tissue injury on his heel which was new and I did visualize today this was on the left heel. Nonetheless she wanted to give this a little bit more time and thought it would be appropriate to start the Wound VAC at this point. 04/03/18 on evaluation today patient sacral ulcer actually appears to be doing fairly well at this point. He has been tolerating the  dressing changes without complication. With that being said I'm very pleased with the progress that has been made in regard to his sacral wound over the past week I do not see as much in the way of erythema which is great news. Nonetheless he does have a small area of hyper granulation unfortunately. This is at roughly the 7 o'clock location and I think does need to be addressed so that this will heal more appropriately. Nonetheless I think we may be ready to go ahead and initiate therapy with the Wound VAC. 04/10/18 on evaluation today patient appears to be doing excellent in regard to his sacral ulcer. The show signs of great improvement in overall I'm  very pleased with how things look. He has been tolerating the dressing changes without complication. Specifically this is the Wound VAC. He also seems to be doing well with the antibiotic there is decreased your theme and redness surrounding the sacral area at this point in the wound has filled in quite significantly. 04/17/18 on evaluation today patient actually appears to be doing excellent in regard to his sacral ulcer. He's been tolerating the dressing changes without complication specifically the Wound VAC. There really are no major concerns from the patient nor family this point he is having no pain. He does have a little bit of Epiboly on the lateral portions of the wound where he does have a little bit more depth that will need to be addressed today. 04/23/18 on evaluation today patient's wound actually appears to be doing excellent at this point. He has been tolerating the Wound VAC and this appears to be doing well other than the fact that it seems to be breaking seal at the 6 o'clock location. I do believe that adding a duodenum dressing at this location try and help maintain the seal would be appropriate and likely very effective. With that being said he overall seems to be showing signs of good improvement at this point. There does  not appear to be any evidence of significant infection which is also excellent news. 04/30/18 on evaluation today patient actually appears to be doing well in regard to his sacral ulcer. He's been tolerating the dressing changes without complication. Fortunately there does not appear to be any evidence of infection. Overall I'm very pleased with the progress that has been made up to this point. He does have some blistering underneath the draping unfortunately although this is definitely something that has been noted on other patients previously is a fairly common occurrence. Nonetheless the patient seems to be doing fairly well in general in my pinion based on what I see at this time. I do believe these are fairly superficial and minor. 05/07/18 on evaluation today patient's wound actually appears to be doing excellent at this point in time. He has been tolerating the Wound VAC decently well he states that it is somewhat cumbersome to carry around unfortunately. The only other issue he's been having according to family is that they been having a difficult time keeping the Wound VAC in place and doing what is supposed to do without making. Obviously I do think that this is definitely of concern. Nonetheless I do believe she's made good progress up to this point. I'm very happy in that regard. 05/14/18 on evaluation today patient's wound continues to make good progress at this point. He had a minimal amount of slough noted on the surface which was easily wiped away with saline and gauze and in general I feel like that he is continuing to show excellent progress even with the discontinuation of the Wound VAC. Overall I'm pleased in this regard. He was having issues with the Wound VAC in getting it to seal I think that using the Prisma at this time has been equally efficient and getting the wound to diminish in size. 05/29/18- He is here in follow up evaluation for a sacral ulcer. There is improvement, we  will continue with prisma and he will follow up in two weeks [name], [name] ([id number]) 06/11/18 on evaluation today patient had unfortunately bright green drainage on the dressing upon evaluation today. This is something that we have encountered before although we were able to get  things under control previously with antibiotics. With that being said currently upon further inspection of the three areas of hyper granulation that were separate from the actual wound itself which was almost healed it really appears that these all have some depth to them. They are more tunnels that really have not closed or at least have reopened as a result likely of infection in my pinion. This is definitely not what I was expecting or hoping for. 06/26/18 on evaluation today patient continues to experience issues with what appears to be small abscesses in the sacral region unfortunately. With that being said he has been tolerating the dressing changes without complication which is good news. He's not having any significant discomfort which is also good news. With that being said his daughter states that after he left last week that the packing that we have placed fell out quite rapidly and he subsequently healed over very quick to the point they were not able to even repack the regions. Nonetheless there appear to be several fluctuance areas noted at this point there's one central region that does seem to be draining still discharge that is somewhat green in color. I did review the results of the wound culture which did show evidence of infection with both MRSA as well as pseudomonas based on that result. Nonetheless again with his other current medications we are not able to do the Cipro due to issues with potential long QT syndrome. I am going to give him a prescription for doxycycline in order to help with the potential MRSA infection. 07/09/18 on evaluation today patient actually appears to be doing about the  same in regard to his sacral wound. He actually has his MRI scheduled for tomorrow and then subsequently is going to be having his infectious disease appointment for Thursday of this week. Fortunately he's not having any significant discomfort he still has your theme in the sacral region he still has several blister/flux went areas although they technically are not blisters this is more like a underlying abscess. The one area that is open still does probe down to bone. Again I am concerned about a deeper infection possibly even sacral osteomyelitis. 07/23/18 on evaluation today patient actually appears to be doing very well all things considered in regard to his sacral ulcer. Since I've last seen him he actually did have his MRI performed which showed that he has a complex fluid collection superficial to the distal sacrum measuring 5.9 x 4.4 x 2.8 cm which abuts the posterior aspect of the distal sacrum and the sacrum itself shows cortical destruction consistent with osteomyelitis. She has also been seen by infectious disease and currently orders have been initiated for IV antibiotic therapy for the next eight weeks. He was seen by Janene Madeira NP and placed on Ceftazidime and Daptomycin. Currently he has not really been on this quite long enough to see a sufficient response to the new orders as far as antibiotic therapy is concerned. Nonetheless it does appear that he is likely on the right track at this point which is great news. Nonetheless the question which both Colletta Maryland and myself have discussed both with the patient and between ourselves is whether or not the patient may need to be seen by surgeon for surgical evacuation of the fluid collection/abscess and possible debridement of the sacrum itself. Nonetheless at this time my personal opinion is probably gonna be that we wait and get this at least a couple weeks to see the response that he  receives with the IV antibiotic therapy. 08/06/18  on evaluation today patient actually appears to be doing rather well in regard to his sacral ulcer region all things considered. He has been tolerating the dressing changes without complication. With that being said there is not any obvious opening nor any drainage noted at this point in time upon evaluation. The patient has been tolerating the dressing changes though. He's doing well with the IV antibiotic therapy. 08/20/18 on evaluation today patient appears to be doing wonderful in regard to his sacral region. In fact there appears to be no wound opening or drainage at this time. Overall I'm very pleased with how things have progressed. The patient likewise as well as his wife and daughter are very happy as well. Patient History Information obtained from Patient. Family History Cancer - Paternal Grandparents, Diabetes - Father, Heart Disease - Mother,Father, Stroke - Father, No family history of Hypertension, Kidney Disease, Lung Disease, Seizures, Thyroid Problems, Tuberculosis. Social History Never smoker, Marital Status - Widowed, Alcohol Use - Never, Drug Use - No History, Caffeine Use - Daily. Medical History Hospitalization/Surgery History - 01/20/2018, [facility], [facility]. [name], [name] ([id number]) Medical And Surgical History Notes Endocrine Borderline Oncologic Melanoma on back Review of Systems (ROS) Constitutional Symptoms (General Health) Denies complaints or symptoms of Fever, Chills. Respiratory The patient has no complaints or symptoms. Cardiovascular The patient has no complaints or symptoms. Psychiatric The patient has no complaints or symptoms. Objective Constitutional Well-nourished and well-hydrated in no acute distress. Vitals Time Taken: 2:50 PM, Height: 69 in, Weight: 155 lbs, BMI: 22.9, Temperature: 98.5 F, Pulse: 58 bpm, Respiratory Rate: 18 breaths/min, Blood Pressure: 123/92 mmHg. Respiratory normal breathing without difficulty. clear to auscultation  bilaterally. Cardiovascular regular rate and rhythm with normal S1, S2. Psychiatric this patient is able to make decisions and demonstrates good insight into disease process. Alert and Oriented x 3. pleasant and cooperative. General Notes: Patient's wound bed currently shows evidence of great epithelialization enclosure. Again I did program that does not appear to be any evidence of abscess or fluid collection noted at this time. Overall very pleased with how things have progressed there's no sign of this reopening which is excellent. Integumentary (Hair, Skin) Wound #1 status is Healed - Epithelialized. Original cause of wound was Pressure Injury. The wound is located on the Sacrum. The wound measures 0cm length x 0cm width x 0cm depth; 0cm^2 area and 0cm^3 volume. There is no tunneling or undermining noted. There is a none present amount of drainage noted. The wound margin is distinct with the outline attached to the wound base. There is no granulation within the wound bed. There is no necrotic tissue within the wound bed. The periwound skin appearance did not exhibit: Callus, Crepitus, Excoriation, Induration, Rash, Scarring, Dry/Scaly, Maceration, Atrophie Blanche, Cyanosis, Ecchymosis, Hemosiderin Staining, Mottled, Pallor, Rubor, Erythema. Periwound temperature was noted as No Abnormality. [name], [name] ([id number]) Assessment Active Problems ICD-10 Pressure ulcer of sacral region, stage 4 Irritant contact dermatitis due to detergents Plan Discharge From [facility] Services: Discharge from [facility] - treatment complete The biggest issue I see right now is apparently when the dressing was being changed the other day the PICC line actually slid out 1 inch from where it was previously placed on his arm. Nonetheless the family did not know to contact infectious disease or radiology regarding this as the nurse told her according to the family that everything was okay. Nonetheless I  do think that the physician or in  this case nurse practitioner should be contacted in order to see what they would like to do as far as further evaluation possibly imaging to check placement is concerned. The family was made aware of that today and they are definitely in agreement with doing this. We will subsequently see him back only as needed at this point gonna leave everything open to air I do not see any evidence of a wound that needs to be covered contrast and I'm afraid that keeping the dressing on this is actually causing it to stay more moist which could in the end be worse. Patient and family are in agreement with this. They have any issues they will contact the office and let me know. Electronic Signature(s) Signed: 08/21/2018 11:39:33 AM By: Worthy Keeler PA-C Entered By: Worthy Keeler on 08/20/2018 17:49:21 [name], [name] ([id number]) -------------------------------------------------------------------------------- ROS/PFSH Details Patient Name: [name] Date of Service: 08/20/2018 2:45 PM Medical Record Number: [id number] Patient Account Number: [id number] Date of Birth/Sex: 11-Jul-1931 (84 y.o. M) Treating RN: Cornell Barman Primary Care Provider: Burman Freestone Other Clinician: Referring Provider: Burman Freestone Treating Provider/Extender: [name], HOYT Weeks in Treatment: 31 Information Obtained From Patient Wound History Do you currently have one or more open woundso Yes How many open wounds do you currently haveo 2 Approximately how long have you had your woundso 3 weeks Has your wound(s) ever healed and then re-openedo Yes Have you had any lab work done in the past montho Yes Have you tested positive for osteomyelitis (bone infection)o No Have you had any tests for circulation on your legso No Constitutional Symptoms (General Health) Complaints and Symptoms: Negative for: Fever; Chills Eyes Medical History: Positive for: Cataracts - bilateral  removal Negative for: Glaucoma; Optic Neuritis Ear/Nose/Mouth/Throat Medical History: Negative for: Chronic sinus problems/congestion; Middle ear problems Hematologic/Lymphatic Medical History: Negative for: Anemia; Hemophilia; Human Immunodeficiency Virus; Lymphedema; Sickle Cell Disease Respiratory Complaints and Symptoms: No Complaints or Symptoms Medical History: Positive for: Asthma Negative for: Aspiration; Chronic Obstructive Pulmonary Disease (COPD); Pneumothorax; Sleep Apnea; Tuberculosis Cardiovascular Complaints and Symptoms: No Complaints or Symptoms Medical History: Positive for: Angina; Arrhythmia; Coronary Artery Disease; Hypertension; Myocardial Infarction - 2001 Negative for: Congestive Heart Failure; Deep Vein Thrombosis; Hypotension; Peripheral Arterial Disease; Peripheral [name], [name] ([id number]) Venous Disease; Phlebitis; Vasculitis Gastrointestinal Medical History: Negative for: Cirrhosis ; Colitis; Crohnos; Hepatitis A; Hepatitis B; Hepatitis C Endocrine Medical History: Negative for: Type I Diabetes; Type II Diabetes Past Medical History Notes: Borderline Genitourinary Medical History: Negative for: End Stage Renal Disease Immunological Medical History: Negative for: Lupus Erythematosus; Raynaudos; Scleroderma Integumentary (Skin) Medical History: Negative for: History of Burn; History of pressure wounds Musculoskeletal Medical History: Positive for: Osteoarthritis Negative for: Gout; Rheumatoid Arthritis; Osteomyelitis Neurologic Medical History: Positive for: Dementia; Neuropathy Negative for: Quadriplegia; Paraplegia; Seizure Disorder Oncologic Medical History: Negative for: Received Chemotherapy; Received Radiation Past Medical History Notes: Melanoma on back Psychiatric Complaints and Symptoms: No Complaints or Symptoms Medical History: Negative for: Anorexia/bulimia; Confinement Anxiety HBO Extended History  Items Eyes: Cataracts BLADE, SCHEFF ([id number]) Immunizations Pneumococcal Vaccine: Received Pneumococcal Vaccination: Yes Implantable Devices Hospitalization / Surgery History Name of Hospital Purpose of Hospitalization/Surgery Date [facility] 01/20/2018 Family and Social History Cancer: Yes - Paternal Grandparents; Diabetes: Yes - Father; Heart Disease: Yes - Mother,Father; Hypertension: No; Kidney Disease: No; Lung Disease: No; Seizures: No; Stroke: Yes - Father; Thyroid Problems: No; Tuberculosis: No; Never smoker; Marital Status - Widowed; Alcohol Use: Never; Drug Use: No History; Caffeine Use: Daily;  Financial Concerns: No; Food, Clothing or Shelter Needs: No; Support System Lacking: No; Transportation Concerns: No; Advanced Directives: Yes (Not Provided); Patient does not want information on Advanced Directives; Do not resuscitate: Yes (Not Provided); Living Will: Yes (Not Provided); Medical Power of Attorney: Yes - Amore Grater (Not Provided) Physician Affirmation I have reviewed and agree with the above information. Electronic Signature(s) Signed: 08/21/2018 11:39:33 AM By: Worthy Keeler PA-C Signed: 08/23/2018 10:02:59 AM By: Gretta Cool, BSN, RN, CWS, Kim RN, BSN Entered By: Worthy Keeler on 08/20/2018 17:47:39 [name], [place] [name] ([id number]) -------------------------------------------------------------------------------- SuperBill Details Patient Name: DEJUAN, ELMAN Date of Service: 08/20/2018 Medical Record Number: [id number] Patient Account Number: [id number] Date of Birth/Sex: March 18, 1931 (84 y.o. M) Treating RN: Cornell Barman Primary Care Provider: Burman Freestone Other Clinician: Referring Provider: Burman Freestone Treating Provider/Extender: [name], HOYT Weeks in Treatment: 27 Diagnosis Coding ICD-10 Codes Code Description L89.154 Pressure ulcer of sacral region, stage 4 L24.0 Irritant contact dermatitis due to detergents Facility Procedures CPT4 Code:  [id number] Description: [id number] - WOUND CARE VISIT-LEV 2 EST PT Modifier: Quantity: 1 Physician Procedures CPT4 Code: [id number] Description: 48270 - WC PHYS LEVEL 3 - EST PT ICD-10 Diagnosis Description L89.154 Pressure ulcer of sacral region, stage 4 Modifier: Quantity: 1 Electronic Signature(s) Signed: 08/21/2018 11:39:33 AM By: Worthy Keeler PA-C Entered By: Worthy Keeler on 08/20/2018 17:49:39

## 2018-08-31 ENCOUNTER — Encounter: Payer: Self-pay | Admitting: Infectious Diseases

## 2018-08-31 ENCOUNTER — Telehealth: Payer: Self-pay | Admitting: Behavioral Health

## 2018-08-31 ENCOUNTER — Ambulatory Visit (INDEPENDENT_AMBULATORY_CARE_PROVIDER_SITE_OTHER): Payer: Medicare Other | Admitting: Infectious Diseases

## 2018-08-31 VITALS — BP 165/75 | HR 58 | Temp 97.6°F | Wt 171.0 lb

## 2018-08-31 DIAGNOSIS — L89154 Pressure ulcer of sacral region, stage 4: Secondary | ICD-10-CM

## 2018-08-31 DIAGNOSIS — Z5181 Encounter for therapeutic drug level monitoring: Secondary | ICD-10-CM | POA: Diagnosis not present

## 2018-08-31 DIAGNOSIS — Z452 Encounter for adjustment and management of vascular access device: Secondary | ICD-10-CM | POA: Diagnosis not present

## 2018-08-31 NOTE — Assessment & Plan Note (Addendum)
Completely healed in with IV abx, good wound care and nutrition. He has been released from wound care clinic and doing very well. We can stop antibiotics early as he has completed 43 days of therapy for MRSA/Pseudomonas osteomyelitis/deep wound with daptomycin and ceftazidime. They are all very pleased with the progress he has shown. Welcomed and answered all questions.   He can follow up here as needed.

## 2018-08-31 NOTE — Progress Notes (Signed)
Patient: [name]  DOB: 08-23-1931 MRN: [id number] PCP: [name], FNP  Referring Provider: [facility]   Patient Active Problem List   Diagnosis Date Noted  . PICC (peripherally inserted central catheter) in place 07/12/2018  . Medication monitoring encounter 03/22/2018  . Malnutrition of moderate degree 01/22/2018  . Sacral decubitus ulcer, stage IV, healing  01/22/2018  . Atrial fibrillation ([place]) 01/21/2018  . Unresponsive episode 01/20/2018  . Elevated troponin 01/20/2018  . Pre-syncope 07/31/2017  . B12 deficiency 06/19/2017  . Essential hypertension   . Bladder outlet obstruction   . Generalized weakness 03/03/2014  . ESOPHAGEAL STRICTURE 09/20/2010  . ORTHOSTATIC DIZZINESS 08/30/2010  . IBS 08/26/2010  . RECTAL BLEEDING 08/26/2010  . RECTAL PAIN 08/26/2010  . ARTHRITIS 08/26/2010  . LOSS OF APPETITE 08/26/2010  . OTHER DYSPHAGIA 08/26/2010  . COLONIC POLYPS, HX OF 08/26/2010  . PALPITATIONS, HX OF 08/18/2010  . PERIPHERAL NEUROPATHY 05/01/2009  . History of myocardial infarction 05/01/2009  . ALLERGIC RHINITIS, SEASONAL 05/01/2009  . DEPRESSION, HX OF 05/01/2009  . Personal history of other diseases of digestive system 05/01/2009  . NEPHROLITHIASIS, HX OF 05/01/2009  . BENIGN PROSTATIC HYPERTROPHY, HX OF 05/01/2009  . LAMINECTOMY, HX OF 05/01/2009  . Hyperlipidemia 10/20/2008  . CAD (coronary artery disease) 10/20/2008  . GERD 10/20/2008     Subjective:  [name] is a 84 y.o. male here for follow up on sacral osteomyelitis in the setting of stage 4 sacral ulcer that developed following a fall. Culture from sinus tract revealed MRSA (S-bactrim, doxy) and Pseudomonas (pansens).   He is here today with his daughters and wife. He and his family tell me that he has done very well on IV antibiotics and the wound has healed in completely. He was actually released from the wound clinic on 11/04 and has not had  a dressing over site for over 2 weeks now. He has had no side effects to antibiotics and they have gotten all doses in as ordered. PICC line is without pain, drainage or erythema and is well maintained by [facility]. No swelling or altered sensation in affected distal extremity.   Review of Systems  All other systems reviewed and are negative.   Past Medical History:  Diagnosis Date  . Arthritis    "hips, back" (06/17/2015)  . Asthma   . Atrial fibrillation ([place]) 01/21/2018  . Chronic chest pain   . Chronic lower back pain   . Coronary artery disease    a. s/p BMS to RCA in 2002; b. s/p cutting balloon POBA ;   c. cath 6/12: oDx 80% (treated with repeat cutting balloon POBA), mLAD 50% with 30-40% at Dx, CFX 30%, pRCA 25% with patent stents;  d.  Lex MV 4/14:  Low Risk - EF 61%, inf scar with peri-infarct ischemia  . Dyspnea    chronic  . Essential hypertension   . GERD (gastroesophageal reflux disease)    h/o esophageal spasm  . GI bleed 03/03/2014  . Headache   . History of blood transfusion    "related to OR"  . History of hiatal hernia   . Hyperlipidemia   . Hypertension   . Melanoma of lower back ([place]) late 1990's  . Memory loss   . Myocardial infarction ([place]) 2001   x 1, confirned 1 possible  . Pre-syncope 07/31/2017    Outpatient Medications Prior to Visit  Medication Sig Dispense Refill  .  acetaminophen (TYLENOL) 325 MG tablet Take 2 tablets (650 mg total) by mouth every 4 (four) hours as needed for headache or mild pain.    Marland Kitchen amiodarone (PACERONE) 200 MG tablet Take 1 tablet (200 mg total) by mouth daily. 90 tablet 3  . amLODipine (NORVASC) 2.5 MG tablet Take 1 tablet (2.5 mg total) by mouth daily. 30 tablet 6  . aspirin EC 81 MG tablet Take 1 tablet (81 mg total) by mouth daily.    Marland Kitchen atorvastatin (LIPITOR) 40 MG tablet Take 1 tablet by mouth daily.   10  . atorvastatin (LIPITOR) 80 MG tablet   10  . cefTAZidime (FORTAZ) 2 g injection     . cyclobenzaprine  (FLEXERIL) 5 MG tablet Take 7.5 mg by mouth 3 (three) times daily as needed for muscle spasms.     Marland Kitchen DAPTOmycin 350 MG SOLR     . doxazosin (CARDURA) 1 MG tablet TAKE 1 TABLET BY MOUTH ONCE DAILY. 15 tablet 0  . feeding supplement, ENSURE ENLIVE, (ENSURE ENLIVE) LIQD Take 237 mLs by mouth 2 (two) times daily between meals. 237 mL 12  . feeding supplement, ENSURE ENLIVE, (ENSURE ENLIVE) LIQD Take 237 mLs by mouth 2 (two) times daily between meals. 237 mL 12  . HYDROcodone-acetaminophen (NORCO) 7.5-325 MG tablet Take 1 tablet by mouth every 4 (four) hours as needed for moderate pain. FOR PAIN 20 tablet 0  . pantoprazole (PROTONIX) 40 MG tablet TAKE 1 TABLET BY MOUTH TWICE DAILY 30 tablet 0  . tamsulosin (FLOMAX) 0.4 MG CAPS capsule Take 0.4 mg by mouth daily.     . vitamin B-12 1000 MCG tablet Take 1 tablet (1,000 mcg total) by mouth daily. 30 tablet 0  . atorvastatin (LIPITOR) 80 MG tablet Take 1 tablet (80 mg total) by mouth daily. 90 tablet 3   No facility-administered medications prior to visit.      Allergies  Allergen Reactions  . Budesonide-Formoterol Fumarate Swelling    Mouth and tongue swelling.  . Iohexol Shortness Of Breath and Itching    Pt was given 100cc of Omnipaque 300 followed by itching/ dyspnea.  . Simvastatin Other (See Comments)    REACTION: unknown  . Demerol [Meperidine] Other (See Comments)    REACTION: Hallucinations  . Ivp Dye [Iodinated Diagnostic Agents] Other (See Comments)    Iodine pt had a reaction when he had a CT DONE  . Meperidine Hcl Other (See Comments)    REACTION: Hallucinations  . Tape Other (See Comments)    Tears skin off, Please use "paper" tape  . Naproxen Swelling  . Pregabalin Rash  . Sulfonamide Derivatives Rash    Social History   Tobacco Use  . Smoking status: Never Smoker  . Smokeless tobacco: Never Used  Substance Use Topics  . Alcohol use: No  . Drug use: No    Family History  Problem Relation Age of Onset  . Diabetes  Father   . Heart disease Father   . Asthma Father   . Heart disease Mother        CABG hx age 34  . Colon cancer Son        hx   . Colitis Son        hx  . Crohn's disease Son   . Prostate cancer Paternal Grandfather     Objective:   Vitals:   08/31/18 0923  BP: (!) 165/75  Pulse: (!) 58  Temp: 97.6 F (36.4 C)  Weight: 171 lb (77.6  kg)   Body mass index is 27.6 kg/m.  Physical Exam  Constitutional: He is oriented to person, place, and time. He appears well-developed and well-nourished.  Seated comfortably in the chair. Family present.   HENT:  Mouth/Throat: Oropharynx is clear and moist and mucous membranes are normal. Normal dentition. No dental abscesses.  Cardiovascular: Normal rate, regular rhythm and normal heart sounds.  Pulmonary/Chest: Effort normal and breath sounds normal.  Abdominal: Soft. He exhibits no distension. There is no tenderness.  Musculoskeletal: Edema: trace ankle edema.  Sacral wound completely healed in. Scarring defect with some contracted skin. No erythema, drainage or signs of infection.   Lymphadenopathy:    He has no cervical adenopathy.  Neurological: He is alert and oriented to person, place, and time.  Skin: Skin is warm and dry. No rash noted.  Daughter brought picture of sacral wound today - appears to have some erythema around wound margins but overall healed in nicely since last time I saw him with exception to a draining sinus tract. Yellow scab in the center of the wound bed.   Psychiatric: He has a normal mood and affect. Judgment normal.  In good spirits today and engaged in care discussion.   RUE PICC line - clean and dry w/o erythema, tenderness, cording or distal swelling of extremity.   Lab Results: Lab Results  Component Value Date   WBC 7.5 06/04/2018   HGB 9.9 (L) 06/04/2018   HCT 30.5 (L) 06/04/2018   MCV 84 06/04/2018   PLT 309 06/04/2018    Lab Results  Component Value Date   CREATININE 1.07 06/04/2018   BUN  14 06/04/2018   NA 141 06/04/2018   K 4.6 06/04/2018   CL 102 06/04/2018   CO2 26 06/04/2018    Lab Results  Component Value Date   ALT 21 04/12/2018   AST 22 04/12/2018   ALKPHOS 95 04/12/2018   BILITOT 0.3 04/12/2018     Assessment & Plan:   Problem List Items Addressed This Visit      Unprioritized   Medication monitoring encounter    All OPAT lab work reviewed and within normal limits. ESR and CRP normalized early in on therapy. Kidney function and CBCs stable.       PICC (peripherally inserted central catheter) in place    PICC line looks good. Will call [facility] to have them remove now.       Sacral decubitus ulcer, stage IV, healing     Completely healed in with IV abx, good wound care and nutrition. He has been released from wound care clinic and doing very well. We can stop antibiotics early as he has completed 43 days of therapy for MRSA/Pseudomonas osteomyelitis/deep wound with daptomycin and ceftazidime. They are all very pleased with the progress he has shown. Welcomed and answered all questions.   He can follow up here as needed.          Return if symptoms worsen or fail to improve.   Janene Madeira, MSN, NP-C [facility] [place] Pager: [phone] Office: [phone]  08/31/18  10:17 AM

## 2018-08-31 NOTE — Assessment & Plan Note (Signed)
All OPAT lab work reviewed and within normal limits. ESR and CRP normalized early in on therapy. Kidney function and CBCs stable.

## 2018-08-31 NOTE — Patient Instructions (Signed)
So happy to see you have healed up nicely.   We can stop the PICC line and all antibiotics now.   Follow up as needed for now.   Happy Holidays!

## 2018-08-31 NOTE — Telephone Encounter (Signed)
Reader spoke with [name].  Gave verbal order per Janene Madeira to stop IV antibiotics and pull PICC line today 08/31/2018.  [name] verified order with read back.  Patient and family were made aware of Pull PICC order in the office today. Pricilla Riffle RN

## 2018-08-31 NOTE — Assessment & Plan Note (Signed)
PICC line looks good. Will call [facility] to have them remove now.

## 2018-09-09 NOTE — Progress Notes (Signed)
Cardiology Office Note   Date:  09/11/2018   ID:  [name], DOB 12-04-1930, MRN [id number]  PCP:  [name], FNP  Cardiologist:   Dorris Carnes, MD   Pt presents for f/u of CAD     History of Present Illness: [name] is a 84 y.o. male with a history of CAD s/p stent to RCA and cutting balloon angioplasty to the Dx, chronic chest pain, chronic dysphagia s/p dilations x2, HTN, HLD, and BPH who presented to the ED on 11/14/15 with CP.  Last cat cath in 2015 showed patent stent and nonobstructive disease in the diagonal (12/2013). The pt was admitted in 2016 for  RO for MI  Myovue showedInferolateral MI  No ischemia  LVEF 55%  Losartan 25 mg was started for better BP contro  Admitted in APril 2019 after fall and decubitus    Was found to be in atrial fibrillation        He was seen by [name] in May   I saw him in Aug 2019  The pt is dizzy but has not fallen  Very careful Wound has healed   He is off of ABX    No CP      Outpatient Medications Prior to Visit  Medication Sig Dispense Refill  . acetaminophen (TYLENOL) 325 MG tablet Take 2 tablets (650 mg total) by mouth every 4 (four) hours as needed for headache or mild pain.    Marland Kitchen amiodarone (PACERONE) 200 MG tablet Take 1 tablet (200 mg total) by mouth daily. 90 tablet 3  . amLODipine (NORVASC) 2.5 MG tablet Take 1 tablet (2.5 mg total) by mouth daily. 30 tablet 6  . aspirin EC 81 MG tablet Take 1 tablet (81 mg total) by mouth daily.    Marland Kitchen atorvastatin (LIPITOR) 80 MG tablet Take 1 tablet (80 mg total) by mouth daily. 90 tablet 3  . doxazosin (CARDURA) 1 MG tablet TAKE 1 TABLET BY MOUTH ONCE DAILY. 15 tablet 0  . feeding supplement, ENSURE ENLIVE, (ENSURE ENLIVE) LIQD Take 237 mLs by mouth 2 (two) times daily between meals. 237 mL 12  . HYDROcodone-acetaminophen (NORCO) 7.5-325 MG tablet Take 1 tablet by mouth every 4 (four) hours as needed for moderate pain. FOR PAIN 20 tablet 0  . pantoprazole (PROTONIX) 40 MG  tablet TAKE 1 TABLET BY MOUTH TWICE DAILY 30 tablet 0  . tamsulosin (FLOMAX) 0.4 MG CAPS capsule Take 0.4 mg by mouth daily.     . vitamin B-12 1000 MCG tablet Take 1 tablet (1,000 mcg total) by mouth daily. 30 tablet 0  . atorvastatin (LIPITOR) 40 MG tablet Take 1 tablet by mouth daily.   10  . atorvastatin (LIPITOR) 80 MG tablet   10  . cefTAZidime (FORTAZ) 2 g injection     . cyclobenzaprine (FLEXERIL) 5 MG tablet Take 7.5 mg by mouth 3 (three) times daily as needed for muscle spasms.     Marland Kitchen DAPTOmycin 350 MG SOLR     . feeding supplement, ENSURE ENLIVE, (ENSURE ENLIVE) LIQD Take 237 mLs by mouth 2 (two) times daily between meals. (Patient not taking: Reported on 09/11/2018) 237 mL 12   No facility-administered medications prior to visit.      Allergies:   Budesonide-formoterol fumarate; Iohexol; Simvastatin; Demerol [meperidine]; Ivp dye [iodinated diagnostic agents]; Meperidine hcl; Tape; Naproxen; Pregabalin; and Sulfonamide derivatives   Past Medical History:  Diagnosis Date  . Arthritis    "hips, back" (06/17/2015)  .  Asthma   . Atrial fibrillation ([place]) 01/21/2018  . Chronic chest pain   . Chronic lower back pain   . Coronary artery disease    a. s/p BMS to RCA in 2002; b. s/p cutting balloon POBA ;   c. cath 6/12: oDx 80% (treated with repeat cutting balloon POBA), mLAD 50% with 30-40% at Dx, CFX 30%, pRCA 25% with patent stents;  d.  Lex MV 4/14:  Low Risk - EF 61%, inf scar with peri-infarct ischemia  . Dyspnea    chronic  . Essential hypertension   . GERD (gastroesophageal reflux disease)    h/o esophageal spasm  . GI bleed 03/03/2014  . Headache   . History of blood transfusion    "related to OR"  . History of hiatal hernia   . Hyperlipidemia   . Hypertension   . Melanoma of lower back ([place]) late 1990's  . Memory loss   . Myocardial infarction ([place]) 2001   x 1, confirned 1 possible  . Pre-syncope 07/31/2017    Past Surgical History:  Procedure Laterality Date    . ANTERIOR CERVICAL DECOMP/DISCECTOMY FUSION    . BACK SURGERY  multiple  . CARDIAC CATHETERIZATION     "I've had 17 caths" (06/17/2015)  . CATARACT EXTRACTION W/ INTRAOCULAR LENS  IMPLANT, BILATERAL Bilateral   . CERVICAL DISC SURGERY  multiple  . COLONOSCOPY WITH PROPOFOL N/A 04/17/2014   Procedure: COLONOSCOPY WITH PROPOFOL;  Surgeon: Winfield Cunas., MD;  Location: [facility] ENDOSCOPY;  Service: Endoscopy;  Laterality: N/A;  . CORONARY ANGIOPLASTY WITH STENT PLACEMENT  x 2 stents    previous percutaneous intervention on the  RCA and the diagonal branch  . ESOPHAGOGASTRODUODENOSCOPY  03/23/2012   Procedure: ESOPHAGOGASTRODUODENOSCOPY (EGD);  Surgeon: Winfield Cunas., MD;  Location: [facility] ENDOS[facility];  Service: Endoscopy;  Laterality: N/A;  . ESOPHAGOGASTRODUODENOSCOPY N/A 03/04/2014   Procedure: ESOPHAGOGASTRODUODENOSCOPY (EGD);  Surgeon: Arta Silence, MD;  Location[facility] ENDOSCOPY;  Service: Endoscopy;  Laterality: N/A;  . LAMINECTOMY    . LEFT HEART CATHETERIZATION WITH CORONARY ANGIOGRAM N/A 01/13/2014   Procedure: LEFT HEART CATHETERIZATION WITH CORONARY ANGIOGRAM;  Surgeon: Blane Ohara, MD;  Location: [facility] CATH LAB;  Service: Cardiovascular;  Laterality: N/A;  . MELANOMA EXCISION  late 1990's   "lower back"  . POSTERIOR LAMINECTOMY / DECOMPRESSION CERVICAL SPINE    . SAVORY DILATION  03/23/2012   Procedure: SAVORY DILATION;  Surgeon: Winfield Cunas., MD;  Location: [facility] ENDOSCOPY;  Service: Endoscopy;  Laterality: N/A;  . TONSILLECTOMY  1930's     Social History:  The patient  reports that he has never smoked. He has never used smokeless tobacco. He reports that he does not drink alcohol or use drugs.   Family History:  The patient's family history includes Asthma in his father; Colitis in his son; Colon cancer in his son; Crohn's disease in his son; Diabetes in his father; Heart disease in his father and mother; Prostate cancer in his paternal grandfather.    ROS:  Please see the  history of present illness. All other systems are reviewed and  Negative to the above problem except as noted.    PHYSICAL EXAM: VS:  BP 124/70   Pulse (!) 58   Ht 5\' 6"  (1.676 m)   Wt 170 lb 3.2 oz (77.2 kg)   SpO2 97%   BMI 27.47 kg/m    BP laying 137/70   P 48    Sitting   120/67  P 53  (dizzy) Standing   132/71    P 57 (dizzy)  Standing 4 min  124/73  P 58  (dizzy, like being pulled down)  GEN: Well nourished, well developed, in no acute distress  HEENT: normal  Neck: JVP is normal   No, carotid bruits, or masses Cardiac: RRR; no murmurs, rubs, or gallops,  Tr edema  Respiratory:  clear to auscultation bilaterally, normal work of breathing GI: soft, nontender, nondistended, + BS  No hepatomegaly  MS: no deformity Moving all extremities   Skin: warm and dry, no rash Neuro:  Strength and sensation are intact Psych: euthymic mood, full affect   EKG:  EKG is ordered today.  SR 48 bpm      Lipid Panel    Component Value Date/Time   CHOL 126 04/12/2018 1043   TRIG 65 04/12/2018 1043   HDL 43 04/12/2018 1043   CHOLHDL 2.9 04/12/2018 1043   CHOLHDL 3 01/19/2015 1315   VLDL 14.2 01/19/2015 1315   LDLCALC 70 04/12/2018 1043      Wt Readings from Last 3 Encounters:  09/11/18 170 lb 3.2 oz (77.2 kg)  08/31/18 171 lb (77.6 kg)  08/20/18 175 lb (79.4 kg)      ASSESSMENT AND PLAN:  1 CAD No symtpoms of angina   W  2  PAF   On amiodarone   In SR   HR slow   I would pull back to 100 mg per day I will review labs    He has not fallen  Would consider placing on Eliquis  As he is at increased risk for stroke   3   Dizziness   Pt symtpomatic but BP without significant change   Will review labs    4   HL  Continue on lipitor  F/U in a few months       Current medicines are reviewed at length with the patient today.  The patient does not have concerns regarding medicines.  The following changes have been made:   Labs/ tests ordered today include: No orders of the  defined types were placed in this encounter.    Disposition:   FU with  in   Signed, Dorris Carnes, MD  09/11/2018 10:27 AM    [address] [place], [place], [place]  [address] Phone: [phone]; Fax: [phone]

## 2018-09-11 ENCOUNTER — Ambulatory Visit (INDEPENDENT_AMBULATORY_CARE_PROVIDER_SITE_OTHER): Payer: Medicare Other | Admitting: Internal Medicine

## 2018-09-11 ENCOUNTER — Encounter: Payer: Self-pay | Admitting: Internal Medicine

## 2018-09-11 VITALS — BP 124/70 | HR 58 | Ht 66.0 in | Wt 170.2 lb

## 2018-09-11 DIAGNOSIS — I480 Paroxysmal atrial fibrillation: Secondary | ICD-10-CM

## 2018-09-11 DIAGNOSIS — I25119 Atherosclerotic heart disease of native coronary artery with unspecified angina pectoris: Secondary | ICD-10-CM

## 2018-09-11 DIAGNOSIS — E782 Mixed hyperlipidemia: Secondary | ICD-10-CM | POA: Diagnosis not present

## 2018-09-11 DIAGNOSIS — R42 Dizziness and giddiness: Secondary | ICD-10-CM | POA: Diagnosis not present

## 2018-09-11 DIAGNOSIS — I2510 Atherosclerotic heart disease of native coronary artery without angina pectoris: Secondary | ICD-10-CM | POA: Diagnosis not present

## 2018-09-11 MED ORDER — AMIODARONE HCL 200 MG PO TABS: 100.0000 mg | ORAL_TABLET | Freq: Every day | ORAL | 3 refills | Status: DC

## 2018-09-11 NOTE — Patient Instructions (Addendum)
Medication Instructions:  Your physician has recommended you make the following change in your medication:  1.) decrease amiodarone to 100 mg daily--take 1/2 pill  If you need a refill on your cardiac medications before your next appointment, please call your pharmacy.   Lab work: Today: tsh, cbc, bmet  If you have labs (blood work) drawn today and your tests are completely normal, you will receive your results only by: Marland Kitchen MyChart Message (if you have MyChart) OR . A paper copy in the mail If you have any lab test that is abnormal or we need to change your treatment, we will call you to review the results.  Testing/Procedures: none  Follow-Up: At [facility], you and your health needs are our priority.  As part of our continuing mission to provide you with exceptional heart care, we have created designated Provider Care Teams.  These Care Teams include your primary Cardiologist (physician) and Advanced Practice Providers (APPs -  Physician Assistants and Nurse Practitioners) who all work together to provide you with the care you need, when you need it. You will need a follow up appointment in:  2-3 months.  Please call our office 2 months in advance to schedule this appointment.  You may see Dorris Carnes, MD or one of the following Advanced Practice Providers on your designated Care Team: Richardson Dopp, PA-C [place], [place] . Daune Perch, NP  Any Other Special Instructions Will Be Listed Below (If Applicable).

## 2018-09-12 ENCOUNTER — Telehealth: Payer: Self-pay | Admitting: *Deleted

## 2018-09-12 DIAGNOSIS — I480 Paroxysmal atrial fibrillation: Secondary | ICD-10-CM

## 2018-09-12 DIAGNOSIS — I2510 Atherosclerotic heart disease of native coronary artery without angina pectoris: Secondary | ICD-10-CM

## 2018-09-12 LAB — CBC
Hematocrit: 36 % — ABNORMAL LOW (ref 37.5–51.0)
Hemoglobin: 11.8 g/dL — ABNORMAL LOW (ref 13.0–17.7)
MCH: 28.6 pg (ref 26.6–33.0)
MCHC: 32.8 g/dL (ref 31.5–35.7)
MCV: 87 fL (ref 79–97)
PLATELETS: 286 10*3/uL (ref 150–450)
RBC: 4.13 x10E6/uL — AB (ref 4.14–5.80)
RDW: 15.3 % (ref 12.3–15.4)
WBC: 7.5 10*3/uL (ref 3.4–10.8)

## 2018-09-12 LAB — BASIC METABOLIC PANEL
BUN / CREAT RATIO: 16 (ref 10–24)
BUN: 22 mg/dL (ref 8–27)
CALCIUM: 9.7 mg/dL (ref 8.6–10.2)
CHLORIDE: 101 mmol/L (ref 96–106)
CO2: 24 mmol/L (ref 20–29)
CREATININE: 1.38 mg/dL — AB (ref 0.76–1.27)
GFR calc Af Amer: 54 mL/min/{1.73_m2} — ABNORMAL LOW (ref >59)
GFR, EST NON AFRICAN AMERICAN: 46 mL/min/{1.73_m2} — AB (ref >59)
GLUCOSE: 100 mg/dL — AB (ref 65–99)
Potassium: 4.8 mmol/L (ref 3.5–5.2)
Sodium: 141 mmol/L (ref 134–144)

## 2018-09-12 LAB — TSH: TSH: 2.88 u[IU]/mL (ref 0.450–4.500)

## 2018-09-12 MED ORDER — APIXABAN 5 MG PO TABS: 5.0000 mg | ORAL_TABLET | Freq: Two times a day (BID) | ORAL | 11 refills | Status: DC

## 2018-09-12 MED ORDER — AMLODIPINE BESYLATE 2.5 MG PO TABS: 1.2500 mg | ORAL_TABLET | Freq: Every day | ORAL | 3 refills | Status: DC

## 2018-09-12 NOTE — Telephone Encounter (Signed)
-----   Message from Biddle, MD sent at 09/12/2018  4:26 PM EST ----- CBC is improved    Thyroid function is normal  Kidney function is down a little bit   Make sure he is getting adequate fluids  Recomm: 1.  Cut amlodipine in 1/2 2  Stop aspirin 3   Since he has not fallen I would recomm he start Eliquis 5 mg bid    4  F/U CBC and BMET in 3 wks    5   He has cut amiodarone down to 100

## 2018-09-24 ENCOUNTER — Other Ambulatory Visit: Payer: Self-pay

## 2018-09-24 ENCOUNTER — Encounter (HOSPITAL_COMMUNITY): Payer: Self-pay | Admitting: Emergency Medicine

## 2018-09-24 ENCOUNTER — Emergency Department (HOSPITAL_COMMUNITY): Payer: Medicare Other

## 2018-09-24 ENCOUNTER — Inpatient Hospital Stay (HOSPITAL_COMMUNITY)
Admission: EM | Admit: 2018-09-24 | Discharge: 2018-09-26 | DRG: 377 | Disposition: A | Discharge status: Home Health | Payer: Medicare Other | Attending: Internal Medicine | Admitting: Internal Medicine

## 2018-09-24 DIAGNOSIS — I2510 Atherosclerotic heart disease of native coronary artery without angina pectoris: Secondary | ICD-10-CM | POA: Diagnosis not present

## 2018-09-24 DIAGNOSIS — I252 Old myocardial infarction: Secondary | ICD-10-CM

## 2018-09-24 DIAGNOSIS — I25119 Atherosclerotic heart disease of native coronary artery with unspecified angina pectoris: Secondary | ICD-10-CM | POA: Diagnosis not present

## 2018-09-24 DIAGNOSIS — K625 Hemorrhage of anus and rectum: Secondary | ICD-10-CM | POA: Diagnosis present

## 2018-09-24 DIAGNOSIS — K219 Gastro-esophageal reflux disease without esophagitis: Secondary | ICD-10-CM | POA: Diagnosis present

## 2018-09-24 DIAGNOSIS — J45909 Unspecified asthma, uncomplicated: Secondary | ICD-10-CM | POA: Diagnosis present

## 2018-09-24 DIAGNOSIS — I480 Paroxysmal atrial fibrillation: Secondary | ICD-10-CM

## 2018-09-24 DIAGNOSIS — I2583 Coronary atherosclerosis due to lipid rich plaque: Secondary | ICD-10-CM

## 2018-09-24 DIAGNOSIS — K922 Gastrointestinal hemorrhage, unspecified: Secondary | ICD-10-CM

## 2018-09-24 DIAGNOSIS — Z961 Presence of intraocular lens: Secondary | ICD-10-CM | POA: Diagnosis present

## 2018-09-24 DIAGNOSIS — K5731 Diverticulosis of large intestine without perforation or abscess with bleeding: Principal | ICD-10-CM | POA: Diagnosis present

## 2018-09-24 DIAGNOSIS — Z9109 Other allergy status, other than to drugs and biological substances: Secondary | ICD-10-CM

## 2018-09-24 DIAGNOSIS — Z91041 Radiographic dye allergy status: Secondary | ICD-10-CM

## 2018-09-24 DIAGNOSIS — Z955 Presence of coronary angioplasty implant and graft: Secondary | ICD-10-CM | POA: Diagnosis not present

## 2018-09-24 DIAGNOSIS — Z825 Family history of asthma and other chronic lower respiratory diseases: Secondary | ICD-10-CM

## 2018-09-24 DIAGNOSIS — Z9842 Cataract extraction status, left eye: Secondary | ICD-10-CM

## 2018-09-24 DIAGNOSIS — Z66 Do not resuscitate: Secondary | ICD-10-CM | POA: Diagnosis present

## 2018-09-24 DIAGNOSIS — E785 Hyperlipidemia, unspecified: Secondary | ICD-10-CM | POA: Diagnosis present

## 2018-09-24 DIAGNOSIS — L89153 Pressure ulcer of sacral region, stage 3: Secondary | ICD-10-CM | POA: Diagnosis present

## 2018-09-24 DIAGNOSIS — Z85820 Personal history of malignant melanoma of skin: Secondary | ICD-10-CM

## 2018-09-24 DIAGNOSIS — N179 Acute kidney failure, unspecified: Secondary | ICD-10-CM | POA: Diagnosis present

## 2018-09-24 DIAGNOSIS — Z800 Family history of malignant neoplasm of digestive organs: Secondary | ICD-10-CM

## 2018-09-24 DIAGNOSIS — Z885 Allergy status to narcotic agent status: Secondary | ICD-10-CM

## 2018-09-24 DIAGNOSIS — Z9841 Cataract extraction status, right eye: Secondary | ICD-10-CM | POA: Diagnosis not present

## 2018-09-24 DIAGNOSIS — N200 Calculus of kidney: Secondary | ICD-10-CM | POA: Diagnosis present

## 2018-09-24 DIAGNOSIS — M545 Low back pain: Secondary | ICD-10-CM | POA: Diagnosis present

## 2018-09-24 DIAGNOSIS — M160 Bilateral primary osteoarthritis of hip: Secondary | ICD-10-CM | POA: Diagnosis present

## 2018-09-24 DIAGNOSIS — E538 Deficiency of other specified B group vitamins: Secondary | ICD-10-CM | POA: Diagnosis present

## 2018-09-24 DIAGNOSIS — N400 Enlarged prostate without lower urinary tract symptoms: Secondary | ICD-10-CM | POA: Diagnosis present

## 2018-09-24 DIAGNOSIS — Z8249 Family history of ischemic heart disease and other diseases of the circulatory system: Secondary | ICD-10-CM

## 2018-09-24 DIAGNOSIS — I10 Essential (primary) hypertension: Secondary | ICD-10-CM

## 2018-09-24 DIAGNOSIS — G8929 Other chronic pain: Secondary | ICD-10-CM | POA: Diagnosis present

## 2018-09-24 DIAGNOSIS — M479 Spondylosis, unspecified: Secondary | ICD-10-CM | POA: Diagnosis present

## 2018-09-24 DIAGNOSIS — I129 Hypertensive chronic kidney disease with stage 1 through stage 4 chronic kidney disease, or unspecified chronic kidney disease: Secondary | ICD-10-CM | POA: Diagnosis present

## 2018-09-24 DIAGNOSIS — Z888 Allergy status to other drugs, medicaments and biological substances status: Secondary | ICD-10-CM

## 2018-09-24 DIAGNOSIS — N184 Chronic kidney disease, stage 4 (severe): Secondary | ICD-10-CM | POA: Diagnosis present

## 2018-09-24 DIAGNOSIS — Z7901 Long term (current) use of anticoagulants: Secondary | ICD-10-CM

## 2018-09-24 DIAGNOSIS — Z886 Allergy status to analgesic agent status: Secondary | ICD-10-CM

## 2018-09-24 DIAGNOSIS — F0390 Unspecified dementia without behavioral disturbance: Secondary | ICD-10-CM | POA: Diagnosis present

## 2018-09-24 DIAGNOSIS — Z79899 Other long term (current) drug therapy: Secondary | ICD-10-CM

## 2018-09-24 DIAGNOSIS — I4891 Unspecified atrial fibrillation: Secondary | ICD-10-CM | POA: Diagnosis present

## 2018-09-24 LAB — COMPREHENSIVE METABOLIC PANEL
ALBUMIN: 3.4 g/dL — AB (ref 3.5–5.0)
ALK PHOS: 84 U/L (ref 38–126)
ALT: 47 U/L — ABNORMAL HIGH (ref 0–44)
ANION GAP: 11 (ref 5–15)
AST: 46 U/L — ABNORMAL HIGH (ref 15–41)
BUN: 25 mg/dL — ABNORMAL HIGH (ref 8–23)
CALCIUM: 9.1 mg/dL (ref 8.9–10.3)
CHLORIDE: 105 mmol/L (ref 98–111)
CO2: 24 mmol/L (ref 22–32)
Creatinine, Ser: 1.58 mg/dL — ABNORMAL HIGH (ref 0.61–1.24)
GFR calc non Af Amer: 39 mL/min — ABNORMAL LOW (ref >60)
GFR, EST AFRICAN AMERICAN: 46 mL/min — AB (ref >60)
GLUCOSE: 126 mg/dL — AB (ref 70–99)
Potassium: 4.2 mmol/L (ref 3.5–5.1)
SODIUM: 140 mmol/L (ref 135–145)
TOTAL PROTEIN: 6.5 g/dL (ref 6.5–8.1)
Total Bilirubin: 1 mg/dL (ref 0.3–1.2)

## 2018-09-24 LAB — CBC WITH DIFFERENTIAL/PLATELET
Abs Immature Granulocytes: 0.02 10*3/uL (ref 0.00–0.07)
Basophils Absolute: 0.1 10*3/uL (ref 0.0–0.1)
Basophils Relative: 1 %
Eosinophils Absolute: 0.5 10*3/uL (ref 0.0–0.5)
Eosinophils Relative: 7 %
HCT: 35.4 % — ABNORMAL LOW (ref 39.0–52.0)
Hemoglobin: 11.1 g/dL — ABNORMAL LOW (ref 13.0–17.0)
Immature Granulocytes: 0 %
Lymphocytes Relative: 21 %
Lymphs Abs: 1.6 10*3/uL (ref 0.7–4.0)
MCH: 28.2 pg (ref 26.0–34.0)
MCHC: 31.4 g/dL (ref 30.0–36.0)
MCV: 90.1 fL (ref 80.0–100.0)
MONO ABS: 0.6 10*3/uL (ref 0.1–1.0)
MONOS PCT: 8 %
Neutro Abs: 4.7 10*3/uL (ref 1.7–7.7)
Neutrophils Relative %: 63 %
Platelets: 253 10*3/uL (ref 150–400)
RBC: 3.93 MIL/uL — ABNORMAL LOW (ref 4.22–5.81)
RDW: 15.4 % (ref 11.5–15.5)
WBC: 7.5 10*3/uL (ref 4.0–10.5)
nRBC: 0 % (ref 0.0–0.2)

## 2018-09-24 LAB — TYPE AND SCREEN
ABO/RH(D): A POS
Antibody Screen: NEGATIVE

## 2018-09-24 LAB — HEMOGLOBIN: Hemoglobin: 11.2 g/dL — ABNORMAL LOW (ref 13.0–17.0)

## 2018-09-24 LAB — POC OCCULT BLOOD, ED: Fecal Occult Bld: POSITIVE — AB

## 2018-09-24 LAB — PROTIME-INR
INR: 1.25
Prothrombin Time: 15.6 seconds — ABNORMAL HIGH (ref 11.4–15.2)

## 2018-09-24 MED ORDER — NALDEMEDINE TOSYLATE 0.2 MG PO TABS: 0.2000 mg | ORAL_TABLET | Freq: Every day | ORAL | Status: DC

## 2018-09-24 MED ORDER — ONDANSETRON HCL 4 MG PO TABS
4.0000 mg | ORAL_TABLET | Freq: Four times a day (QID) | ORAL | Status: DC | PRN
  Administered 2018-09-25: 4 mg via ORAL
  Filled 2018-09-24: qty 1

## 2018-09-24 MED ORDER — ATORVASTATIN CALCIUM 80 MG PO TABS
80.0000 mg | ORAL_TABLET | Freq: Every day | ORAL | Status: DC
  Administered 2018-09-24 – 2018-09-26 (×3): 80 mg via ORAL
  Filled 2018-09-24 (×3): qty 1

## 2018-09-24 MED ORDER — HYDROCODONE-ACETAMINOPHEN 7.5-325 MG PO TABS: 1.0000 | ORAL_TABLET | ORAL | Status: DC | PRN

## 2018-09-24 MED ORDER — ENSURE ENLIVE PO LIQD
237.0000 mL | Freq: Two times a day (BID) | ORAL | Status: DC
  Administered 2018-09-25 – 2018-09-26 (×3): 237 mL via ORAL

## 2018-09-24 MED ORDER — ONDANSETRON HCL 4 MG/2ML IJ SOLN: 4.0000 mg | Freq: Four times a day (QID) | INTRAMUSCULAR | Status: DC | PRN

## 2018-09-24 MED ORDER — DOXAZOSIN MESYLATE 1 MG PO TABS
1.0000 mg | ORAL_TABLET | Freq: Every day | ORAL | Status: DC
  Administered 2018-09-24 – 2018-09-26 (×3): 1 mg via ORAL
  Filled 2018-09-24 (×3): qty 1

## 2018-09-24 MED ORDER — APIXABAN 5 MG PO TABS: 5.0000 mg | ORAL_TABLET | Freq: Two times a day (BID) | ORAL | Status: DC

## 2018-09-24 MED ORDER — PANTOPRAZOLE SODIUM 40 MG PO TBEC
40.0000 mg | DELAYED_RELEASE_TABLET | Freq: Two times a day (BID) | ORAL | Status: DC
  Administered 2018-09-24 – 2018-09-26 (×4): 40 mg via ORAL
  Filled 2018-09-24 (×4): qty 1

## 2018-09-24 MED ORDER — TAMSULOSIN HCL 0.4 MG PO CAPS
0.4000 mg | ORAL_CAPSULE | Freq: Every day | ORAL | Status: DC
  Administered 2018-09-24 – 2018-09-26 (×3): 0.4 mg via ORAL
  Filled 2018-09-24 (×3): qty 1

## 2018-09-24 MED ORDER — MELATONIN 3 MG PO TABS
10.0000 mg | ORAL_TABLET | Freq: Every day | ORAL | Status: DC
  Administered 2018-09-24 – 2018-09-25 (×2): 10.5 mg via ORAL
  Filled 2018-09-24 (×2): qty 3.5

## 2018-09-24 MED ORDER — AMLODIPINE BESYLATE 2.5 MG PO TABS
1.2500 mg | ORAL_TABLET | Freq: Every day | ORAL | Status: DC
  Administered 2018-09-24 – 2018-09-26 (×3): 1.25 mg via ORAL
  Filled 2018-09-24 (×3): qty 1

## 2018-09-24 MED ORDER — AMIODARONE HCL 200 MG PO TABS
100.0000 mg | ORAL_TABLET | Freq: Every day | ORAL | Status: DC
  Administered 2018-09-24 – 2018-09-26 (×3): 100 mg via ORAL
  Filled 2018-09-24 (×3): qty 1

## 2018-09-24 MED ORDER — ACETAMINOPHEN 325 MG PO TABS: 650.0000 mg | ORAL_TABLET | ORAL | Status: DC | PRN

## 2018-09-24 NOTE — Consult Note (Signed)
Reason for Consult: Lower GI bleeding Referring Physician: Hospital team  [name] is an 84 y.o. male.  HPI: Patient seen and examined and discussed with the ER physician in the hospital computer chart was reviewed and case discussed with his 2 daughters in for 1 day he has had bright red blood per rectum and some lower abdominal crampiness but no obvious fever or vomiting he has not had any previous bleeding issues before but does have known diverticuli and is on a blood thinner at home but no extra aspirin or nonsteroidals and has no other complaints and has not had any further GI blood loss since he has been here and may have seen a few clots as well  Past Medical History:  Diagnosis Date  . Arthritis    "hips, back" (06/17/2015)  . Asthma   . Atrial fibrillation ([place]) 01/21/2018  . Chronic chest pain   . Chronic lower back pain   . Coronary artery disease    a. s/p BMS to RCA in 2002; b. s/p cutting balloon POBA ;   c. cath 6/12: oDx 80% (treated with repeat cutting balloon POBA), mLAD 50% with 30-40% at Dx, CFX 30%, pRCA 25% with patent stents;  d.  Lex MV 4/14:  Low Risk - EF 61%, inf scar with peri-infarct ischemia  . Dyspnea    chronic  . Essential hypertension   . GERD (gastroesophageal reflux disease)    h/o esophageal spasm  . GI bleed 03/03/2014  . Headache   . History of blood transfusion    "related to OR"  . History of hiatal hernia   . Hyperlipidemia   . Hypertension   . Melanoma of lower back ([place]) late 1990's  . Memory loss   . Myocardial infarction ([place]) 2001   x 1, confirned 1 possible  . Pre-syncope 07/31/2017    Past Surgical History:  Procedure Laterality Date  . ANTERIOR CERVICAL DECOMP/DISCECTOMY FUSION    . BACK SURGERY  multiple  . CARDIAC CATHETERIZATION     "I've had 17 caths" (06/17/2015)  . CATARACT EXTRACTION W/ INTRAOCULAR LENS  IMPLANT, BILATERAL Bilateral   . CERVICAL DISC SURGERY  multiple  . COLONOSCOPY WITH PROPOFOL N/A 04/17/2014   Procedure: COLONOSCOPY WITH PROPOFOL;  Surgeon: Winfield Cunas., MD;  Location: [facility] ENDOSCOPY;  Service: Endoscopy;  Laterality: N/A;  . CORONARY ANGIOPLASTY WITH STENT PLACEMENT  x 2 stents    previous percutaneous intervention on the  RCA and the diagonal branch  . ESOPHAGOGASTRODUODENOSCOPY  03/23/2012   Procedure: ESOPHAGOGASTRODUODENOSCOPY (EGD);  Surgeon: Winfield Cunas., MD;  Location: [facility];  Service: Endoscopy;  Laterality: N/A;  . ESOPHAGOGASTRODUODENOSCOPY N/A 03/04/2014   Procedure: ESOPHAGOGASTRODUODENOSCOPY (EGD);  Surgeon: Arta Silence, MD;  Location: [facility] ENDOSCOPY;  Service: Endoscopy;  Laterality: N/A;  . LAMINECTOMY    . LEFT HEART CATHETERIZATION WITH CORONARY ANGIOGRAM N/A 01/13/2014   Procedure: LEFT HEART CATHETERIZATION WITH CORONARY ANGIOGRAM;  Surgeon: Blane Ohara, MD;  Location: [facility] CATH LAB;  Service: Cardiovascular;  Laterality: N/A;  . MELANOMA EXCISION  late 1990's   "lower back"  . POSTERIOR LAMINECTOMY / DECOMPRESSION CERVICAL SPINE    . SAVORY DILATION  03/23/2012   Procedure: SAVORY DILATION;  Surgeon: Winfield Cunas., MD;  Location: [facility] ENDOSCOPY;  Service: Endoscopy;  Laterality: N/A;  . TONSILLECTOMY  1930's    Family History  Problem Relation Age of Onset  . Diabetes Father   . Heart disease Father   .  Asthma Father   . Heart disease Mother        CABG hx age 48  . Colon cancer Son        hx   . Colitis Son        hx  . Crohn's disease Son   . Prostate cancer Paternal Grandfather     Social History:  reports that he has never smoked. He has never used smokeless tobacco. He reports that he does not drink alcohol or use drugs.  Allergies:  Allergies  Allergen Reactions  . Budesonide-Formoterol Fumarate Swelling    Mouth and tongue swelling.  . Iohexol Shortness Of Breath and Itching    Pt was given 100cc of Omnipaque 300 followed by itching/ dyspnea.  Clementeen Hoof [Iodinated Diagnostic Agents] Shortness Of Breath and Other  (See Comments)    Iodine pt had a reaction when he had a CT DONE  . Simvastatin Other (See Comments)    REACTION: unknown  . Demerol [Meperidine] Other (See Comments)    REACTION: Hallucinations  . Meperidine Hcl Other (See Comments)    REACTION: Hallucinations  . Tape Other (See Comments)    Tears skin off, Please use "paper" tape  . Naproxen Swelling  . Pregabalin Rash  . Sulfonamide Derivatives Rash    Medications:  I have reviewed the patient's current medications. Results for orders placed or performed during the hospital encounter of 09/24/18 (from the past 48 hour(s))  Type and screen [place]     Status: None   Collection Time: 09/24/18  9:59 AM  Result Value Ref Range   ABO/RH(D) A POS    Antibody Screen NEG    Sample Expiration      09/27/2018 Performed at [facility], [place] [address], [place], [place] [address]   CBC with Differential/Platelet     Status: Abnormal   Collection Time: 09/24/18  9:59 AM  Result Value Ref Range   WBC 7.5 4.0 - 10.5 K/uL   RBC 3.93 (L) 4.22 - 5.81 MIL/uL   Hemoglobin 11.1 (L) 13.0 - 17.0 g/dL   HCT 35.4 (L) 39.0 - 52.0 %   MCV 90.1 80.0 - 100.0 fL   MCH 28.2 26.0 - 34.0 pg   MCHC 31.4 30.0 - 36.0 g/dL   RDW 15.4 11.5 - 15.5 %   Platelets 253 150 - 400 K/uL   nRBC 0.0 0.0 - 0.2 %   Neutrophils Relative % 63 %   Neutro Abs 4.7 1.7 - 7.7 K/uL   Lymphocytes Relative 21 %   Lymphs Abs 1.6 0.7 - 4.0 K/uL   Monocytes Relative 8 %   Monocytes Absolute 0.6 0.1 - 1.0 K/uL   Eosinophils Relative 7 %   Eosinophils Absolute 0.5 0.0 - 0.5 K/uL   Basophils Relative 1 %   Basophils Absolute 0.1 0.0 - 0.1 K/uL   Immature Granulocytes 0 %   Abs Immature Granulocytes 0.02 0.00 - 0.07 K/uL    Comment: Performed at [facility] [address], [place], [place] [address]  Comprehensive metabolic panel     Status: Abnormal   Collection Time: 09/24/18 10:09 AM  Result Value Ref Range   Sodium 140 135 - 145 mmol/L    Potassium 4.2 3.5 - 5.1 mmol/L   Chloride 105 98 - 111 mmol/L   CO2 24 22 - 32 mmol/L   Glucose, Bld 126 (H) 70 - 99 mg/dL   BUN 25 (H) 8 -  23 mg/dL   Creatinine, Ser 1.58 (H) 0.61 - 1.24 mg/dL   Calcium 9.1 8.9 - 10.3 mg/dL   Total Protein 6.5 6.5 - 8.1 g/dL   Albumin 3.4 (L) 3.5 - 5.0 g/dL   AST 46 (H) 15 - 41 U/L   ALT 47 (H) 0 - 44 U/L   Alkaline Phosphatase 84 38 - 126 U/L   Total Bilirubin 1.0 0.3 - 1.2 mg/dL   GFR calc non Af Amer 39 (L) >60 mL/min   GFR calc Af Amer 46 (L) >60 mL/min   Anion gap 11 5 - 15    Comment: Performed at [facility] [address] [place], [place] [address]  Protime-INR     Status: Abnormal   Collection Time: 09/24/18 10:28 AM  Result Value Ref Range   Prothrombin Time 15.6 (H) 11.4 - 15.2 seconds   INR 1.25     Comment: Performed at [facility] [address], [place],  [address]  POC occult blood, ED     Status: Abnormal   Collection Time: 09/24/18 11:45 AM  Result Value Ref Range   Fecal Occult Bld POSITIVE (A) NEGATIVE    Ct Abdomen Pelvis Wo Contrast  Result Date: 09/24/2018 CLINICAL DATA:  84 year old male with lower abdominal pain and bloody stool since yesterday. Sacral decubitus ulcer. EXAM: CT ABDOMEN AND PELVIS WITHOUT CONTRAST TECHNIQUE: Multidetector CT imaging of the abdomen and pelvis was performed following the standard protocol without IV contrast. COMPARISON:  Sacral MRI 07/10/2018. CT Abdomen and Pelvis 01/09/2012. FINDINGS: Lower chest: Negative aside from mild subpleural scarring in both lower lobes. No pericardial or pleural effusion. Calcified coronary artery atherosclerosis and/or stents. Hepatobiliary: Possible hyperdense sludge in the gallbladder. No CT evidence of acute cholecystitis. Increased liver density since 2013. No discrete liver lesion in the absence of contrast. Pancreas: Negative. Spleen: His see Adrenals/Urinary Tract: Normal adrenal glands. Punctate nephrolithiasis on the right with no  hydronephrosis and negative course of the right ureter. Chronic bulky left lower pole renal calculus, now 15 millimeters (series 3, image 39). No left hydronephrosis. Negative course of the left ureter. Numerous pelvic phleboliths. Unremarkable urinary bladder. Stomach/Bowel: Indistinct appearance of the descending, sigmoid colon and rectum which may contain fluid. Diverticulosis from the distal descending through the sigmoid segment. There is mild associated mesenteric stranding adjacent to the diverticula, generalized. No more focal distal large bowel inflammation is identified. The transverse colon and right colon have a more normal appearance. Negative appendix. Negative terminal ileum. No dilated small bowel. Decompressed stomach and duodenum. No free air, free fluid. Vascular/Lymphatic: Aortoiliac calcified atherosclerosis. Vascular patency is not evaluated in the absence of IV contrast. No lymphadenopathy. Reproductive: Negative. Other: No pelvic free fluid. Musculoskeletal: Sclerosis of the lower sacrum has developed since the 2013 CT compatible with sequelae of osteomyelitis as demonstrated in September. Eroded lower coccygeal segments. Mild erosion of the dorsal lower sacrum when compared to the 2013 CT. Overlying soft tissue thickening compatible with residual decubitus wound encompassing an area of 8-10 centimeters (coronal image 132 and series 3, image 68). No tracking soft tissue gas. Chronic lumbar decompression and fusion hardware. Chronic postoperative changes to the medial left iliac bone. No new osseous abnormality identified. IMPRESSION: 1. Diverticulosis in the descending and sigmoid colon with mild diverticulitis difficult to exclude. Fluid in those large bowel segments, consider a diverticular source of lower GI bleeding. 2. 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 3. Nephrolithiasis, bulky  on the left. No evidence of obstructive uropathy. 4.  Calcified coronary artery and Aortic Atherosclerosis (ICD10-I70.0). Electronically Signed   By: Genevie Ann M.D.   On: 09/24/2018 13:42    ROSBlood pressure (!) 170/69, pulse (!) 51, temperature 98.4 F (36.9 C), temperature source Oral, resp. rate 12, weight 78.9 kg, SpO2 97 %. Physical Exam vital signs stable afebrile no acute distress patient lying comfortably in the bed and pertinent for his abdomen being soft nontender BUN and creatinine hair over baseline hemoglobin actually pretty good for him CT with diverticuli only previous colonoscopy reviewed  Assessment/Plan: Probable diverticular bleeding and patient on blood thinners Plan: We will allow clear liquids follow H&H transfuse as needed reevaluate blood thinner needs consider nuclear medicine stat if increased bleeding and if positive IR consult we will check on tomorrow and please call us if we can be of any further assistance with this hospital stay  [facility] E 09/24/2018, 5:35 PM

## 2018-09-24 NOTE — ED Triage Notes (Signed)
Pt in from home with rectal bleeding - states 4 bright red episodes since yesterday afternoon. Takes Eliquis x 1 wk, states he is a little dizzy. BP 183/67, a&ox4

## 2018-09-24 NOTE — H&P (Signed)
[facility]                                                                                    Patient Demographics  [name], is a 84 y.o. male  CSN: [id number]  MRN: [id number]  DOB - Oct 16, 1931  Admit Date - 09/24/2018  Outpatient Primary MD for the patient is [name], FNP   With History of -  Past Medical History:  Diagnosis Date  . Arthritis    "hips, back" (06/17/2015)  . Asthma   . Atrial fibrillation ([place]) 01/21/2018  . Chronic chest pain   . Chronic lower back pain   . Coronary artery disease    a. s/p BMS to RCA in 2002; b. s/p cutting balloon POBA ;   c. cath 6/12: oDx 80% (treated with repeat cutting balloon POBA), mLAD 50% with 30-40% at Dx, CFX 30%, pRCA 25% with patent stents;  d.  Lex MV 4/14:  Low Risk - EF 61%, inf scar with peri-infarct ischemia  . Dyspnea    chronic  . Essential hypertension   . GERD (gastroesophageal reflux disease)    h/o esophageal spasm  . GI bleed 03/03/2014  . Headache   . History of blood transfusion    "related to OR"  . History of hiatal hernia   . Hyperlipidemia   . Hypertension   . Melanoma of lower back ([place]) late 1990's  . Memory loss   . Myocardial infarction ([place]) 2001   x 1, confirned 1 possible  . Pre-syncope 07/31/2017      Past Surgical History:  Procedure Laterality Date  . ANTERIOR CERVICAL DECOMP/DISCECTOMY FUSION    . BACK SURGERY  multiple  . CARDIAC CATHETERIZATION     "I've had 17 caths" (06/17/2015)  . CATARACT EXTRACTION W/ INTRAOCULAR LENS  IMPLANT, BILATERAL Bilateral   . CERVICAL DISC SURGERY  multiple  . COLONOSCOPY WITH PROPOFOL N/A 04/17/2014   Procedure: COLONOSCOPY WITH PROPOFOL;  Surgeon: Winfield Cunas., MD;  Location: [facility] ENDOSCOPY;  Service: Endoscopy;  Laterality: N/A;  . CORONARY ANGIOPLASTY WITH STENT PLACEMENT  x 2 stents    previous percutaneous intervention on the  RCA and the diagonal branch  . ESOPHAGOGASTRODUODENOSCOPY  03/23/2012   Procedure:  ESOPHAGOGASTRODUODENOSCOPY (EGD);  Surgeon: Winfield Cunas., MD;  Location: [facility];  Service: Endoscopy;  Laterality: N/A;  . ESOPHAGOGASTRODUODENOSCOPY N/A 03/04/2014   Procedure: ESOPHAGOGASTRODUODENOSCOPY (EGD);  Surgeon: Arta Silence, MD;  Location: [facility] ENDOSCOPY;  Service: Endoscopy;  Laterality: N/A;  . LAMINECTOMY    . LEFT HEART CATHETERIZATION WITH CORONARY ANGIOGRAM N/A 01/13/2014   Procedure: LEFT HEART CATHETERIZATION WITH CORONARY ANGIOGRAM;  Surgeon: Blane Ohara, MD;  Location: [facility] CATH LAB;  Service: Cardiovascular;  Laterality: N/A;  . MELANOMA EXCISION  late 1990's   "lower back"  . POSTERIOR LAMINECTOMY / DECOMPRESSION CERVICAL SPINE    . SAVORY DILATION  03/23/2012   Procedure: SAVORY DILATION;  Surgeon: Winfield Cunas., MD;  Location: [facility] ENDOSCOPY;  Service: Endoscopy;  Laterality: N/A;  . TONSILLECTOMY  1930's    in for   Chief Complaint  Patient presents with  . Rectal  Bleeding     HPI  [name]  is a 84 y.o. male, with past medical history significant for A. fib on Eliquis presenting today with 2 days history of bright red blood per rectum associated with abdominal pain, left lower quadrant.  No fever or chills, no nausea and vomiting.  Patient usually follows with [facility], however no history of endoscopy lately .  In the emergency room the patient had a CT of the abdomen which showed no diverticulitis, but diverticulosis in the descending and sigmoid colon with fluid in these segments suspicious for diverticular source of lower GI bleed.  His hemoglobin was 11.1.  Dr. Watt Climes from GI was consulted and he will follow-up with the patient on consult.    Review of Systems    In addition to the HPI above,   No Headache, No changes with Vision or hearing, No problems swallowing food or Liquids, No Chest pain, Cough or Shortness of Breath, No Blood in stool or Urine, No dysuria, No new skin rashes or bruises, No new joints pains-aches,  No new  weakness, tingling, numbness in any extremity, No recent weight gain or loss, No polyuria, polydypsia or polyphagia, No significant Mental Stressors.  A full 10 point Review of Systems was done, except as stated above, all other Review of Systems were negative.   Social History Social History   Tobacco Use  . Smoking status: Never Smoker  . Smokeless tobacco: Never Used  Substance Use Topics  . Alcohol use: No     Family History Family History  Problem Relation Age of Onset  . Diabetes Father   . Heart disease Father   . Asthma Father   . Heart disease Mother        CABG hx age 69  . Colon cancer Son        hx   . Colitis Son        hx  . Crohn's disease Son   . Prostate cancer Paternal Grandfather      Prior to Admission medications   Medication Sig Start Date End Date Taking? Authorizing Provider  amiodarone (PACERONE) 200 MG tablet Take 0.5 tablets (100 mg total) by mouth daily. 09/11/18  Yes Fay Records, MD  amLODipine (NORVASC) 2.5 MG tablet Take 0.5 tablets (1.25 mg total) by mouth daily. Patient taking differently: Take 2.5 mg by mouth daily.  09/12/18  Yes Fay Records, MD  apixaban (ELIQUIS) 5 MG TABS tablet Take 1 tablet (5 mg total) by mouth 2 (two) times daily. 09/12/18  Yes Fay Records, MD  atorvastatin (LIPITOR) 80 MG tablet Take 1 tablet (80 mg total) by mouth daily. 12/27/17 09/24/18 Yes Weaver, Scott T, PA-C  doxazosin (CARDURA) 1 MG tablet TAKE 1 TABLET BY MOUTH ONCE DAILY. Patient taking differently: Take 1 mg by mouth daily.  06/01/17  Yes Fay Records, MD  feeding supplement, ENSURE ENLIVE, (ENSURE ENLIVE) LIQD Take 237 mLs by mouth 2 (two) times daily between meals. 08/02/17  Yes Fritzi Mandes, MD  Melatonin 10 MG TABS Take 10 mg by mouth at bedtime.   Yes [provider]  pantoprazole (PROTONIX) 40 MG tablet TAKE 1 TABLET BY MOUTH TWICE DAILY Patient taking differently: Take 40 mg by mouth 2 (two) times daily.  06/01/17  Yes Fay Records, MD  SYMPROIC 0.2 MG TABS Take 0.2 mg by mouth daily. 09/11/18  Yes [provider]  tamsulosin (FLOMAX) 0.4 MG CAPS capsule Take 0.4 mg  by mouth daily.  01/13/15  Yes [provider]  vitamin B-12 1000 MCG tablet Take 1 tablet (1,000 mcg total) by mouth daily. 06/21/17  Yes Geradine Girt, DO  acetaminophen (TYLENOL) 325 MG tablet Take 2 tablets (650 mg total) by mouth every 4 (four) hours as needed for headache or mild pain. Patient not taking: Reported on 09/24/2018 12/16/13   Erlene Quan, PA-C  HYDROcodone-acetaminophen [facility]) 7.5-325 MG tablet Take 1 tablet by mouth every 4 (four) hours as needed for moderate pain. FOR PAIN Patient not taking: Reported on 09/24/2018 01/24/18   Gladstone Lighter, MD    Allergies  Allergen Reactions  . Budesonide-Formoterol Fumarate Swelling    Mouth and tongue swelling.  . Iohexol Shortness Of Breath and Itching    Pt was given 100cc of Omnipaque 300 followed by itching/ dyspnea.  Clementeen Hoof [Iodinated Diagnostic Agents] Shortness Of Breath and Other (See Comments)    Iodine pt had a reaction when he had a CT DONE  . Simvastatin Other (See Comments)    REACTION: unknown  . Demerol [Meperidine] Other (See Comments)    REACTION: Hallucinations  . Meperidine Hcl Other (See Comments)    REACTION: Hallucinations  . Tape Other (See Comments)    Tears skin off, Please use "paper" tape  . Naproxen Swelling  . Pregabalin Rash  . Sulfonamide Derivatives Rash    Physical Exam  Vitals  Blood pressure (!) 170/69, pulse (!) 51, temperature 98.4 F (36.9 C), temperature source Oral, resp. rate 12, weight 78.9 kg, SpO2 97 %.   1. General elderly male, well-developed, well-nourished, looks tired  2.  Flat affect and insight, Not Suicidal or Homicidal, Awake Alert, Oriented X 3.  3. No F.N deficits, grossly, patient moving all extremities.  4. Ears and Eyes appear Normal, Conjunctivae clear, PERRLA. Moist Oral Mucosa.  5. Supple  Neck, No JVD, No cervical lymphadenopathy appriciated, No Carotid Bruits.  6. Symmetrical Chest wall movement, Good air movement bilaterally, CTAB.  7. RRR, No Gallops, Rubs or Murmurs, No Parasternal Heave.  8. Positive Bowel Sounds, Abdomen Soft, mild lower abdominal tenderness.  9.  No Cyanosis, Normal Skin Turgor, No Skin Rash or Bruise.  10. Good muscle tone,  joints appear normal , no effusions, Normal ROM.    Data Review  CBC Recent Labs  Lab 09/24/18 0959  WBC 7.5  HGB 11.1*  HCT 35.4*  PLT 253  MCV 90.1  MCH 28.2  MCHC 31.4  RDW 15.4  LYMPHSABS 1.6  MONOABS 0.6  EOSABS 0.5  BASOSABS 0.1   ------------------------------------------------------------------------------------------------------------------  Chemistries  Recent Labs  Lab 09/24/18 1009  NA 140  K 4.2  CL 105  CO2 24  GLUCOSE 126*  BUN 25*  CREATININE 1.58*  CALCIUM 9.1  AST 46*  ALT 47*  ALKPHOS 84  BILITOT 1.0   ------------------------------------------------------------------------------------------------------------------ estimated creatinine clearance is 33.7 mL/min (A) (by C-G formula based on SCr of 1.58 mg/dL (H)). ------------------------------------------------------------------------------------------------------------------ No results for input(s): TSH, T4TOTAL, T3FREE, THYROIDAB in the last 72 hours.  Invalid input(s): FREET3   Coagulation profile Recent Labs  Lab 09/24/18 1028  INR 1.25   ------------------------------------------------------------------------------------------------------------------- No results for input(s): DDIMER in the last 72 hours. -------------------------------------------------------------------------------------------------------------------  Cardiac Enzymes No results for input(s): CKMB, TROPONINI, MYOGLOBIN in the last 168 hours.  Invalid input(s):  CK ------------------------------------------------------------------------------------------------------------------ Invalid input(s): POCBNP   ---------------------------------------------------------------------------------------------------------------  Urinalysis    Component Value Date/Time   COLORURINE YELLOW (A) 01/25/2018 1020   APPEARANCEUR CLEAR (  A) 01/25/2018 1020   LABSPEC 1.012 01/25/2018 1020   PHURINE 7.0 01/25/2018 1020   GLUCOSEU NEGATIVE 01/25/2018 1020   GLUCOSEU NEGATIVE 08/30/2010 1329   HGBUR SMALL (A) 01/25/2018 1020   BILIRUBINUR NEGATIVE 01/25/2018 1020   KETONESUR NEGATIVE 01/25/2018 1020   PROTEINUR NEGATIVE 01/25/2018 1020   UROBILINOGEN 1.0 06/17/2015 1200   NITRITE NEGATIVE 01/25/2018 1020   LEUKOCYTESUR NEGATIVE 01/25/2018 1020    ----------------------------------------------------------------------------------------------------------------   Imaging results:   Ct Abdomen Pelvis Wo Contrast  Result Date: 09/24/2018 CLINICAL DATA:  84 year old male with lower abdominal pain and bloody stool since yesterday. Sacral decubitus ulcer. EXAM: CT ABDOMEN AND PELVIS WITHOUT CONTRAST TECHNIQUE: Multidetector CT imaging of the abdomen and pelvis was performed following the standard protocol without IV contrast. COMPARISON:  Sacral MRI 07/10/2018. CT Abdomen and Pelvis 01/09/2012. FINDINGS: Lower chest: Negative aside from mild subpleural scarring in both lower lobes. No pericardial or pleural effusion. Calcified coronary artery atherosclerosis and/or stents. Hepatobiliary: Possible hyperdense sludge in the gallbladder. No CT evidence of acute cholecystitis. Increased liver density since 2013. No discrete liver lesion in the absence of contrast. Pancreas: Negative. Spleen: His see Adrenals/Urinary Tract: Normal adrenal glands. Punctate nephrolithiasis on the right with no hydronephrosis and negative course of the right ureter. Chronic bulky left lower pole renal  calculus, now 15 millimeters (series 3, image 39). No left hydronephrosis. Negative course of the left ureter. Numerous pelvic phleboliths. Unremarkable urinary bladder. Stomach/Bowel: Indistinct appearance of the descending, sigmoid colon and rectum which may contain fluid. Diverticulosis from the distal descending through the sigmoid segment. There is mild associated mesenteric stranding adjacent to the diverticula, generalized. No more focal distal large bowel inflammation is identified. The transverse colon and right colon have a more normal appearance. Negative appendix. Negative terminal ileum. No dilated small bowel. Decompressed stomach and duodenum. No free air, free fluid. Vascular/Lymphatic: Aortoiliac calcified atherosclerosis. Vascular patency is not evaluated in the absence of IV contrast. No lymphadenopathy. Reproductive: Negative. Other: No pelvic free fluid. Musculoskeletal: Sclerosis of the lower sacrum has developed since the 2013 CT compatible with sequelae of osteomyelitis as demonstrated in September. Eroded lower coccygeal segments. Mild erosion of the dorsal lower sacrum when compared to the 2013 CT. Overlying soft tissue thickening compatible with residual decubitus wound encompassing an area of 8-10 centimeters (coronal image 132 and series 3, image 68). No tracking soft tissue gas. Chronic lumbar decompression and fusion hardware. Chronic postoperative changes to the medial left iliac bone. No new osseous abnormality identified. IMPRESSION: 1. Diverticulosis in the descending and sigmoid colon with mild diverticulitis difficult to exclude. Fluid in those large bowel segments, consider a diverticular source of lower GI bleeding. 2. 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 3. Nephrolithiasis, bulky on the left. No evidence of obstructive uropathy. 4. Calcified coronary artery and Aortic Atherosclerosis (ICD10-I70.0). Electronically Signed    By: Genevie Ann M.D.   On: 09/24/2018 13:42    My personal review of EKG: Sinus bradycardia with first-degree AV block  Assessment & Plan  GI bleed, looks lower/diverticular bleed Hold Eliquis Continue with Protonix p.o. Hemoglobin is stable, will monitor and transfuse as needed [facility] consulted/Dr. Watt Climes  History of coronary artery disease status post angioplasty and stent placements in the past  Hyperlipidemia on Lipitor  Paroxysmal atrial fib on amiodarone and Eliquis, placed on hold due to GI bleed today  Hypertension on Norvasc and Cardizem  History of dementia with syncope in the past.  History  of lower back melanoma.  Acute on chronic kidney injury with a creatinine of 1.58.  Last creatinine 1.38 on 08/2018  DVT Prophylaxis SCDs  AM Labs Ordered, also please review Full Orders  Family Communication: Admission, patients condition and plan of care including tests being ordered have been discussed with the patient and daughter who indicate understanding and agree with the plan and Code Status.  Code Status DNR  Disposition Plan: Home  Time spent in minutes : 42 minutes  Condition GUARDED   @SIGNATURE @

## 2018-09-24 NOTE — ED Notes (Signed)
IV found to be loose, resecured with non adhesive securement to prevent skin irritation or tear.

## 2018-09-24 NOTE — Progress Notes (Signed)
[name] [id number]  Code Status: DNR Admission Data: 09/24/2018 7:37 PM  Attending Provider: Laren Everts  ONG:[id number], Lehman Prom, FNP  Consults/ Treatment Team: Treatment Team:  Clarene Essex, MD  [name] is a 84 y.o. male patient admitted from ED awake, alert - oriented X 4 - no acute distress noted. VSS - Blood pressure (!) 174/80, pulse (!) 50, temperature 98.6 F (37 C), temperature source Oral, resp. rate 20, weight 78.9 kg, SpO2 99 %. no c/o shortness of breath, no c/o chest pain.  Orientation to room, and floor completed with information packet given to patient/family. Admission INP armband ID verified with patient/family, and in place.  SR up x 2, fall assessment complete, with patient and family able to verbalize understanding of risk associated with falls, and verbalized understanding to call nsg before up out of bed. Call light within reach, patient able to voice, and demonstrate understanding. Stage 3 noted on sacrum. ?  Will cont to eval and treat per MD orders.  Melonie Florida, RN  09/24/2018 7:37 PM

## 2018-09-24 NOTE — ED Provider Notes (Addendum)
[facility] EMERGENCY DEPARTMENT Provider Note   CSN: [id number] Arrival date & time: 09/24/18  4196     History   Chief Complaint Chief Complaint  Patient presents with  . Rectal Bleeding    HPI MARQUEE FUCHS is a 84 y.o. male.  HPI   [name] is a 84 y.o. male, with a history of A. fib on Eliquis, CAD, HTN, GERD, MI, presenting to the ED with bright red blood per rectum beginning yesterday afternoon.  Accompanied by abdominal discomfort, mostly in the left lower quadrant, feels like a pressure or swelling, worsens right before passage of blood, currently 3/10, radiating across the lower abdomen.  He has not had this issue before.  Accompanied by perhaps some chills. Denies fever, nausea/vomiting, chest pain, shortness of breath, urinary symptoms, or any other complaints.     Past Medical History:  Diagnosis Date  . Arthritis    "hips, back" (06/17/2015)  . Asthma   . Atrial fibrillation ([place]) 01/21/2018  . Chronic chest pain   . Chronic lower back pain   . Coronary artery disease    a. s/p BMS to RCA in 2002; b. s/p cutting balloon POBA ;   c. cath 6/12: oDx 80% (treated with repeat cutting balloon POBA), mLAD 50% with 30-40% at Dx, CFX 30%, pRCA 25% with patent stents;  d.  Lex MV 4/14:  Low Risk - EF 61%, inf scar with peri-infarct ischemia  . Dyspnea    chronic  . Essential hypertension   . GERD (gastroesophageal reflux disease)    h/o esophageal spasm  . GI bleed 03/03/2014  . Headache   . History of blood transfusion    "related to OR"  . History of hiatal hernia   . Hyperlipidemia   . Hypertension   . Melanoma of lower back ([place]) late 1990's  . Memory loss   . Myocardial infarction ([place]) 2001   x 1, confirned 1 possible  . Pre-syncope 07/31/2017    Patient Active Problem List   Diagnosis Date Noted  . PICC (peripherally inserted central catheter) in place 07/12/2018  . Medication monitoring encounter 03/22/2018  . Malnutrition  of moderate degree 01/22/2018  . Sacral decubitus ulcer, stage IV, healing  01/22/2018  . Atrial fibrillation ([place]) 01/21/2018  . Unresponsive episode 01/20/2018  . Elevated troponin 01/20/2018  . Pre-syncope 07/31/2017  . B12 deficiency 06/19/2017  . Essential hypertension   . Bladder outlet obstruction   . Generalized weakness 03/03/2014  . ESOPHAGEAL STRICTURE 09/20/2010  . ORTHOSTATIC DIZZINESS 08/30/2010  . IBS 08/26/2010  . RECTAL BLEEDING 08/26/2010  . RECTAL PAIN 08/26/2010  . ARTHRITIS 08/26/2010  . LOSS OF APPETITE 08/26/2010  . OTHER DYSPHAGIA 08/26/2010  . COLONIC POLYPS, HX OF 08/26/2010  . PALPITATIONS, HX OF 08/18/2010  . PERIPHERAL NEUROPATHY 05/01/2009  . History of myocardial infarction 05/01/2009  . ALLERGIC RHINITIS, SEASONAL 05/01/2009  . DEPRESSION, HX OF 05/01/2009  . Personal history of other diseases of digestive system 05/01/2009  . NEPHROLITHIASIS, HX OF 05/01/2009  . BENIGN PROSTATIC HYPERTROPHY, HX OF 05/01/2009  . LAMINECTOMY, HX OF 05/01/2009  . Hyperlipidemia 10/20/2008  . CAD (coronary artery disease) 10/20/2008  . GERD 10/20/2008    Past Surgical History:  Procedure Laterality Date  . ANTERIOR CERVICAL DECOMP/DISCECTOMY FUSION    . BACK SURGERY  multiple  . CARDIAC CATHETERIZATION     "I've had 17 caths" (06/17/2015)  . CATARACT EXTRACTION W/ INTRAOCULAR LENS  IMPLANT, BILATERAL Bilateral   .  CERVICAL DISC SURGERY  multiple  . COLONOSCOPY WITH PROPOFOL N/A 04/17/2014   Procedure: COLONOSCOPY WITH PROPOFOL;  Surgeon: Winfield Cunas., MD;  Location: [facility] ENDOSCOPY;  Service: Endoscopy;  Laterality: N/A;  . CORONARY ANGIOPLASTY WITH STENT PLACEMENT  x 2 stents    previous percutaneous intervention on the  RCA and the diagonal branch  . ESOPHAGOGASTRODUODENOSCOPY  03/23/2012   Procedure: ESOPHAGOGASTRODUODENOSCOPY (EGD);  Surgeon: Winfield Cunas., MD;  Location: [facility];  Service: Endoscopy;  Laterality: N/A;  .  ESOPHAGOGASTRODUODENOSCOPY N/A 03/04/2014   Procedure: ESOPHAGOGASTRODUODENOSCOPY (EGD);  Surgeon: Arta Silence, MD;  Location: [facility];  Service: Endoscopy;  Laterality: N/A;  . LAMINECTOMY    . LEFT HEART CATHETERIZATION WITH CORONARY ANGIOGRAM N/A 01/13/2014   Procedure: LEFT HEART CATHETERIZATION WITH CORONARY ANGIOGRAM;  Surgeon: Blane Ohara, MD;  Location: [facility] CATH LAB;  Service: Cardiovascular;  Laterality: N/A;  . MELANOMA EXCISION  late 1990's   "lower back"  . POSTERIOR LAMINECTOMY / DECOMPRESSION CERVICAL SPINE    . SAVORY DILATION  03/23/2012   Procedure: SAVORY DILATION;  Surgeon: Winfield Cunas., MD;  Location: [facility];  Service: Endoscopy;  Laterality: N/A;  . TONSILLECTOMY  1930's        Home Medications    Prior to Admission medications   Medication Sig Start Date End Date Taking? Authorizing Provider  amiodarone (PACERONE) 200 MG tablet Take 0.5 tablets (100 mg total) by mouth daily. 09/11/18  Yes Fay Records, MD  amLODipine (NORVASC) 2.5 MG tablet Take 0.5 tablets (1.25 mg total) by mouth daily. Patient taking differently: Take 2.5 mg by mouth daily.  09/12/18  Yes Fay Records, MD  apixaban (ELIQUIS) 5 MG TABS tablet Take 1 tablet (5 mg total) by mouth 2 (two) times daily. 09/12/18  Yes Fay Records, MD  atorvastatin (LIPITOR) 80 MG tablet Take 1 tablet (80 mg total) by mouth daily. 12/27/17 09/24/18 Yes Weaver, Scott T, PA-C  doxazosin (CARDURA) 1 MG tablet TAKE 1 TABLET BY MOUTH ONCE DAILY. Patient taking differently: Take 1 mg by mouth daily.  06/01/17  Yes Fay Records, MD  feeding supplement, ENSURE ENLIVE, (ENSURE ENLIVE) LIQD Take 237 mLs by mouth 2 (two) times daily between meals. 08/02/17  Yes Fritzi Mandes, MD  Melatonin 10 MG TABS Take 10 mg by mouth at bedtime.   Yes [provider]  pantoprazole (PROTONIX) 40 MG tablet TAKE 1 TABLET BY MOUTH TWICE DAILY Patient taking differently: Take 40 mg by mouth 2 (two) times daily.  06/01/17   Yes Fay Records, MD  SYMPROIC 0.2 MG TABS Take 0.2 mg by mouth daily. 09/11/18  Yes [provider]  tamsulosin (FLOMAX) 0.4 MG CAPS capsule Take 0.4 mg by mouth daily.  01/13/15  Yes [provider]  vitamin B-12 1000 MCG tablet Take 1 tablet (1,000 mcg total) by mouth daily. 06/21/17  Yes Geradine Girt, DO  acetaminophen (TYLENOL) 325 MG tablet Take 2 tablets (650 mg total) by mouth every 4 (four) hours as needed for headache or mild pain. Patient not taking: Reported on 09/24/2018 12/16/13   Erlene Quan, PA-C  HYDROcodone-acetaminophen [facility]) 7.5-325 MG tablet Take 1 tablet by mouth every 4 (four) hours as needed for moderate pain. FOR PAIN Patient not taking: Reported on 09/24/2018 01/24/18   Gladstone Lighter, MD    Family History Family History  Problem Relation Age of Onset  . Diabetes Father   . Heart disease Father   .  Asthma Father   . Heart disease Mother        CABG hx age 57  . Colon cancer Son        hx   . Colitis Son        hx  . Crohn's disease Son   . Prostate cancer Paternal Grandfather     Social History Social History   Tobacco Use  . Smoking status: Never Smoker  . Smokeless tobacco: Never Used  Substance Use Topics  . Alcohol use: No  . Drug use: No     Allergies   Budesonide-formoterol fumarate; Iohexol; Ivp dye [iodinated diagnostic agents]; Simvastatin; Demerol [meperidine]; Meperidine hcl; Tape; Naproxen; Pregabalin; and Sulfonamide derivatives   Review of Systems Review of Systems  Constitutional: Positive for chills. Negative for diaphoresis and fever.  Respiratory: Negative for shortness of breath.   Cardiovascular: Negative for chest pain.  Gastrointestinal: Positive for abdominal pain and blood in stool. Negative for nausea and vomiting.  Genitourinary: Negative for dysuria and hematuria.  Musculoskeletal: Negative for back pain.  Neurological: Negative for dizziness, syncope and light-headedness.  All other  systems reviewed and are negative.    Physical Exam Updated Vital Signs BP (!) 144/67   Pulse (!) 56   Temp 98.4 F (36.9 C) (Oral)   Resp 13   Wt 78.9 kg   SpO2 98%   BMI 28.08 kg/m   Physical Exam  Constitutional: He appears well-developed and well-nourished. No distress.  HENT:  Head: Normocephalic and atraumatic.  Eyes: Conjunctivae are normal.  Neck: Neck supple.  Cardiovascular: Normal rate, regular rhythm, normal heart sounds and intact distal pulses.  Pulmonary/Chest: Effort normal and breath sounds normal. No respiratory distress.  Abdominal: Soft. There is tenderness in the left lower quadrant. There is no guarding.  Genitourinary: Rectal exam shows guaiac positive stool.  Genitourinary Comments: No external hemorrhoids, fissures, or lesions noted. No gross blood, melena, or stool burden. No rectal tenderness. No foreign bodies noted.  Musculoskeletal: He exhibits no edema.  Lymphadenopathy:    He has no cervical adenopathy.  Neurological: He is alert.  Skin: Skin is warm and dry. He is not diaphoretic.  Psychiatric: He has a normal mood and affect. His behavior is normal.  Nursing note and vitals reviewed.    ED Treatments / Results  Labs (all labs ordered are listed, but only abnormal results are displayed) Labs Reviewed  COMPREHENSIVE METABOLIC PANEL - Abnormal; Notable for the following components:      Result Value   Glucose, Bld 126 (*)    BUN 25 (*)    Creatinine, Ser 1.58 (*)    Albumin 3.4 (*)    AST 46 (*)    ALT 47 (*)    GFR calc non Af Amer 39 (*)    GFR calc Af Amer 46 (*)    All other components within normal limits  PROTIME-INR - Abnormal; Notable for the following components:   Prothrombin Time 15.6 (*)    All other components within normal limits  CBC WITH DIFFERENTIAL/PLATELET - Abnormal; Notable for the following components:   RBC 3.93 (*)    Hemoglobin 11.1 (*)    HCT 35.4 (*)    All other components within normal limits  POC  OCCULT BLOOD, ED - Abnormal; Notable for the following components:   Fecal Occult Bld POSITIVE (*)    All other components within normal limits  TYPE AND SCREEN   Hemoglobin  Date Value Ref Range Status  09/24/2018 11.1 (L) 13.0 - 17.0 g/dL Final  09/11/2018 11.8 (L) 13.0 - 17.7 g/dL Final  06/04/2018 9.9 (L) 13.0 - 17.7 g/dL Final  03/22/2018 9.5 (L) 13.2 - 17.1 g/dL Final  01/26/2018 9.9 (L) 13.0 - 18.0 g/dL Final  01/24/2018 10.9 (L) 13.0 - 18.0 g/dL Final    EKG EKG Interpretation  Date/Time:  Monday September 24 2018 09:47:45 EST Ventricular Rate:  56 PR Interval:    QRS Duration: 111 QT Interval:  491 QTC Calculation: 474 R Axis:   17 Text Interpretation:  Sinus rhythm Abnormal R-wave progression, early transition Baseline wander in lead(s) V1 afib no longer present compared to April 2019 Confirmed by Sherwood Gambler [phone]) on 09/24/2018 10:06:18 AM   Radiology Ct Abdomen Pelvis Wo Contrast  Result Date: 09/24/2018 CLINICAL DATA:  84 year old male with lower abdominal pain and bloody stool since yesterday. Sacral decubitus ulcer. EXAM: CT ABDOMEN AND PELVIS WITHOUT CONTRAST TECHNIQUE: Multidetector CT imaging of the abdomen and pelvis was performed following the standard protocol without IV contrast. COMPARISON:  Sacral MRI 07/10/2018. CT Abdomen and Pelvis 01/09/2012. FINDINGS: Lower chest: Negative aside from mild subpleural scarring in both lower lobes. No pericardial or pleural effusion. Calcified coronary artery atherosclerosis and/or stents. Hepatobiliary: Possible hyperdense sludge in the gallbladder. No CT evidence of acute cholecystitis. Increased liver density since 2013. No discrete liver lesion in the absence of contrast. Pancreas: Negative. Spleen: His see Adrenals/Urinary Tract: Normal adrenal glands. Punctate nephrolithiasis on the right with no hydronephrosis and negative course of the right ureter. Chronic bulky left lower pole renal calculus, now 15 millimeters  (series 3, image 39). No left hydronephrosis. Negative course of the left ureter. Numerous pelvic phleboliths. Unremarkable urinary bladder. Stomach/Bowel: Indistinct appearance of the descending, sigmoid colon and rectum which may contain fluid. Diverticulosis from the distal descending through the sigmoid segment. There is mild associated mesenteric stranding adjacent to the diverticula, generalized. No more focal distal large bowel inflammation is identified. The transverse colon and right colon have a more normal appearance. Negative appendix. Negative terminal ileum. No dilated small bowel. Decompressed stomach and duodenum. No free air, free fluid. Vascular/Lymphatic: Aortoiliac calcified atherosclerosis. Vascular patency is not evaluated in the absence of IV contrast. No lymphadenopathy. Reproductive: Negative. Other: No pelvic free fluid. Musculoskeletal: Sclerosis of the lower sacrum has developed since the 2013 CT compatible with sequelae of osteomyelitis as demonstrated in September. Eroded lower coccygeal segments. Mild erosion of the dorsal lower sacrum when compared to the 2013 CT. Overlying soft tissue thickening compatible with residual decubitus wound encompassing an area of 8-10 centimeters (coronal image 132 and series 3, image 68). No tracking soft tissue gas. Chronic lumbar decompression and fusion hardware. Chronic postoperative changes to the medial left iliac bone. No new osseous abnormality identified. IMPRESSION: 1. Diverticulosis in the descending and sigmoid colon with mild diverticulitis difficult to exclude. Fluid in those large bowel segments, consider a diverticular source of lower GI bleeding. 2. 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 3. Nephrolithiasis, bulky on the left. No evidence of obstructive uropathy. 4. Calcified coronary artery and Aortic Atherosclerosis (ICD10-I70.0). Electronically Signed   By: Genevie Ann M.D.   On:  09/24/2018 13:42    Procedures Procedures (including critical care time)  Medications Ordered in ED Medications - No data to display   Initial Impression / Assessment and Plan / ED Course  I have reviewed the triage vital signs and the nursing notes.  Pertinent labs & imaging  results that were available during my care of the patient were reviewed by me and considered in my medical decision making (see chart for details).  Clinical Course as of Sep 24 1552  Mon Sep 24, 2018  1203 Spoke with Dr. Maryland Pink, radiologist. He states he thinks it would be too much of a risk to give the patient IV contrast, even with pretreatment. If stable enough to wait about 90 minutes for the patient to drink oral contrast, oral contrast would be preferred to no contrast. Otherwise, we may be able to see what we need to see with no contrast.   [SJ]  1439 Spoke with Dr. Laren Everts, hospitalist. Agrees to admit the patient.    [SJ]  [place] with Dr. Watt Climes, [facility].  Hold patient's Eliquis and put patient on clear liquid diet.  He will follow along with the patient and see him either in the hospital or in the office.   [SJ]    Clinical Course User Index [SJ] ,  C, PA-C    Patient presents with bright red blood per rectum and abdominal pain, patient anticoagulated on Eliquis.  Patient is nontoxic appearing, afebrile, not tachycardic, not tachypneic, not hypotensive, maintains excellent SPO2 on room air, and is in no apparent distress. Hemoglobin 11.8 on November 26, 11.1 today.  Declined pain management throughout ED course. Evidence of diverticulosis on CT, suspect this may be the source of the patient's bleed. Patient admitted for further management.  Findings and plan of care discussed with Sherwood Gambler, MD. Dr. Regenia Skeeter personally evaluated and examined this patient.  Vitals:   09/24/18 1345 09/24/18 1400 09/24/18 1415 09/24/18 1430  BP: (!) 166/98 (!) 146/67 (!) 173/78 (!) 170/69  Pulse:  (!) 48 (!) 49 (!) 50 (!) 51  Resp: 11 13 15 12   Temp:      TempSrc:      SpO2: 99% 98% 98% 97%  Weight:         Final Clinical Impressions(s) / ED Diagnoses   Final diagnoses:  Acute GI bleeding    ED Discharge Orders    None       Layla Maw 09/24/18 1555    Lorayne Bender, PA-C 09/24/18 1556    Sherwood Gambler, MD 09/25/18 1515

## 2018-09-25 LAB — CBC
HCT: 33.3 % — ABNORMAL LOW (ref 39.0–52.0)
Hemoglobin: 10.5 g/dL — ABNORMAL LOW (ref 13.0–17.0)
MCH: 28.1 pg (ref 26.0–34.0)
MCHC: 31.5 g/dL (ref 30.0–36.0)
MCV: 89 fL (ref 80.0–100.0)
PLATELETS: 241 10*3/uL (ref 150–400)
RBC: 3.74 MIL/uL — ABNORMAL LOW (ref 4.22–5.81)
RDW: 15.4 % (ref 11.5–15.5)
WBC: 8.5 10*3/uL (ref 4.0–10.5)
nRBC: 0 % (ref 0.0–0.2)

## 2018-09-25 LAB — BASIC METABOLIC PANEL
ANION GAP: 9 (ref 5–15)
BUN: 18 mg/dL (ref 8–23)
CO2: 27 mmol/L (ref 22–32)
Calcium: 9.1 mg/dL (ref 8.9–10.3)
Chloride: 105 mmol/L (ref 98–111)
Creatinine, Ser: 1.28 mg/dL — ABNORMAL HIGH (ref 0.61–1.24)
GFR calc Af Amer: 59 mL/min — ABNORMAL LOW (ref >60)
GFR calc non Af Amer: 51 mL/min — ABNORMAL LOW (ref >60)
Glucose, Bld: 92 mg/dL (ref 70–99)
Potassium: 3.9 mmol/L (ref 3.5–5.1)
SODIUM: 141 mmol/L (ref 135–145)

## 2018-09-25 LAB — MRSA PCR SCREENING: MRSA by PCR: NEGATIVE

## 2018-09-25 LAB — HEMOGLOBIN: HEMOGLOBIN: 11.6 g/dL — AB (ref 13.0–17.0)

## 2018-09-25 MED ORDER — METRONIDAZOLE IN NACL 5-0.79 MG/ML-% IV SOLN
500.0000 mg | Freq: Three times a day (TID) | INTRAVENOUS | Status: DC
  Administered 2018-09-25 (×3): 500 mg via INTRAVENOUS
  Filled 2018-09-25 (×3): qty 100

## 2018-09-25 MED ORDER — SODIUM CHLORIDE 0.9 % IV SOLN
2.0000 g | Freq: Every day | INTRAVENOUS | Status: DC
  Administered 2018-09-25: 2 g via INTRAVENOUS
  Filled 2018-09-25 (×2): qty 20

## 2018-09-25 NOTE — Progress Notes (Signed)
Patient complained of new lower abdominal pain that comes and goes. Offered pain medication but patient said he would hold off for now, but will let staff know if it gets worse. Has had multiple loose BMs but no noticeable blood. Dr. Watt Climes aware. Will continue to monitor.

## 2018-09-25 NOTE — Progress Notes (Signed)
[name] 9:12 AM  Subjective: Patient doing well without any further bleeding Case discussed with the nurse as well and his bowels were brown this morning and he is not having any abdominal pain and wants to go home  Objective: Stable afebrile no acute distress abdomen is soft nontender hemoglobin okay White count normal BUN creatinine decreased  Assessment: Resolved diverticular bleeding  Plan: Consider 1 more day of clear liquids then advancing diet tomorrow we will need to reevaluate blood thinner needs and timing of when to start back if needed and I am not sure he needs antibiotics since I do not think he has diverticulitis and see yesterday's note for other recommendations please call us if we can be of any further assistance with this hospital stay  [facility]  Pager [phone] After 5PM or if no answer call [phone]

## 2018-09-25 NOTE — Progress Notes (Signed)
PROGRESS NOTE                                                                                                                                                                                                             Patient Demographics:    [name], is a 84 y.o. male, DOB - [birth year], IEP:[id number]  Admit date - 09/24/2018   Admitting Physician Merton Border, MD  Outpatient Primary MD for the patient is [name], [place]  LOS - 1  Chief Complaint  Patient presents with  . Rectal Bleeding       Brief Narrative - [name]  is a 84 y.o. male, with past medical history significant for A. fib on Eliquis presenting today with 2 days history of bright red blood per rectum associated with abdominal pain, left lower quadrant.  No fever or chills, no nausea and vomiting.  Patient usually follows with [facility], however no history of endoscopy lately .  In the emergency room the patient had a CT of the abdomen which showed no diverticulitis, but diverticulosis in the descending and sigmoid colon with fluid in these segments suspicious for diverticular source of lower GI bleed.  His hemoglobin was 11.1.  Dr. Watt Climes from GI was consulted and he will follow-up with the patient on consult.   Subjective:    [name] today has, No headache, No chest pain, No abdominal pain - No Nausea, No new weakness tingling or numbness, No Cough - SOB.    Assessment  & Plan :     1.  Lower GI bleed likely diverticular.  Minimal acute blood loss like anemia, monitor H&H, bleeding seems to have resolved, bowel rest, also some evidence of diverticulitis hence placed on IV Rocephin and Flagyl, GI on board.  Currently stable no need for transfusion we will continue to monitor.  2.  Paroxysmal atrial fibrillation.  [facility] to of at least 3.  Continue amiodarone, he is on Eliquis which is currently on hold due to #1 above.  He understands the risks and benefits of holding  anticoagulation.  3.  BPH.  On alpha-blocker continue.  4.  CAD.  No acute issues continue statin for secondary prevention.  5. GERD.  On PPI.  6.  Incidental finding of left renal stone.  No obstruction.  No acute issue.    Family Communication  :  None  Code Status :  DNR  Disposition Plan  :  TBD  Consults  :  GI  Procedures  :    CT - 1. Diverticulosis in the descending and sigmoid colon with mild diverticulitis difficult to exclude. Fluid in those large bowel segments, consider a diverticular source of lower GI bleeding. 2. 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 3. Nephrolithiasis, bulky on the left. No evidence of obstructive uropathy. 4. Calcified coronary artery and Aortic Atherosclerosis  DVT Prophylaxis  :  SCDs    Lab Results  Component Value Date   PLT 241 09/25/2018    Diet :  Diet Order            Diet clear liquid Room service appropriate? Yes; Fluid consistency: Thin  Diet effective now               Inpatient Medications Scheduled Meds: . amiodarone  100 mg Oral Daily  . amLODipine  1.25 mg Oral Daily  . atorvastatin  80 mg Oral Daily  . doxazosin  1 mg Oral Daily  . feeding supplement (ENSURE ENLIVE)  237 mL Oral BID BM  . Melatonin  10.5 mg Oral QHS  . Naldemedine Tosylate  0.2 mg Oral Daily  . pantoprazole  40 mg Oral BID  . tamsulosin  0.4 mg Oral Daily   Continuous Infusions: . cefTRIAXone (ROCEPHIN)  IV 2 g (09/25/18 1018)  . metronidazole 500 mg (09/25/18 1130)   PRN Meds:.acetaminophen, HYDROcodone-acetaminophen, ondansetron **OR** ondansetron (ZOFRAN) IV  Antibiotics  :   Anti-infectives (From admission, onward)   Start     Dose/Rate Route Frequency Ordered Stop   09/25/18 0800  cefTRIAXone (ROCEPHIN) 2 g in sodium chloride 0.9 % 100 mL IVPB     2 g 200 mL/hr over 30 Minutes Intravenous Daily 09/25/18 0658 10/02/18 0959   09/25/18 0800  metroNIDAZOLE (FLAGYL) IVPB 500 mg      500 mg 100 mL/hr over 60 Minutes Intravenous Every 8 hours 09/25/18 0658            Objective:   Vitals:   09/24/18 1430 09/24/18 1807 09/24/18 2018 09/25/18 0535  BP: (!) 170/69 (!) 174/80 (!) 163/68 (!) 152/71  Pulse: (!) 51 (!) 50 (!) 51 (!) 51  Resp: 12 20 18 18   Temp:  98.6 F (37 C) 98.1 F (36.7 C) 98.2 F (36.8 C)  TempSrc:  Oral Oral Oral  SpO2: 97% 99% 99% 97%  Weight:        Wt Readings from Last 3 Encounters:  09/24/18 78.9 kg  09/11/18 77.2 kg  08/31/18 77.6 kg     Intake/Output Summary (Last 24 hours) at 09/25/2018 1221 Last data filed at 09/25/2018 0900 Gross per 24 hour  Intake 320 ml  Output 1270 ml  Net -950 ml     Physical Exam  Awake Alert, Oriented X 3, No new F.N deficits, Normal affect [place].AT,PERRAL Supple Neck,No JVD, No cervical lymphadenopathy appriciated.  Symmetrical Chest wall movement, Good air movement bilaterally, CTAB RRR,No Gallops,Rubs or new Murmurs, No Parasternal Heave +ve B.Sounds, Abd Soft, No tenderness, No organomegaly appriciated, No rebound - guarding or rigidity. No Cyanosis, Clubbing or edema, No new Rash or bruise       Data Review:    CBC Recent Labs  Lab 09/24/18 0959 09/24/18 1815 09/25/18 0202 09/25/18 0940  WBC 7.5  --  8.5  --   HGB 11.1* 11.2* 10.5* 11.6*  HCT 35.4*  --  33.3*  --   PLT 253  --  241  --  MCV 90.1  --  89.0  --   MCH 28.2  --  28.1  --   MCHC 31.4  --  31.5  --   RDW 15.4  --  15.4  --   LYMPHSABS 1.6  --   --   --   MONOABS 0.6  --   --   --   EOSABS 0.5  --   --   --   BASOSABS 0.1  --   --   --     Chemistries  Recent Labs  Lab 09/24/18 1009 09/25/18 0202  NA 140 141  K 4.2 3.9  CL 105 105  CO2 24 27  GLUCOSE 126* 92  BUN 25* 18  CREATININE 1.58* 1.28*  CALCIUM 9.1 9.1  AST 46*  --   ALT 47*  --   ALKPHOS 84  --   BILITOT 1.0  --    ------------------------------------------------------------------------------------------------------------------ No  results for input(s): CHOL, HDL, LDLCALC, TRIG, CHOLHDL, LDLDIRECT in the last 72 hours.  Lab Results  Component Value Date   HGBA1C 6.0 (H) 01/21/2018   ------------------------------------------------------------------------------------------------------------------ No results for input(s): TSH, T4TOTAL, T3FREE, THYROIDAB in the last 72 hours.  Invalid input(s): FREET3 ------------------------------------------------------------------------------------------------------------------ No results for input(s): VITAMINB12, FOLATE, FERRITIN, TIBC, IRON, RETICCTPCT in the last 72 hours.  Coagulation profile Recent Labs  Lab 09/24/18 1028  INR 1.25    No results for input(s): DDIMER in the last 72 hours.  Cardiac Enzymes No results for input(s): CKMB, TROPONINI, MYOGLOBIN in the last 168 hours.  Invalid input(s): CK ------------------------------------------------------------------------------------------------------------------ No results found for: BNP  Micro Results No results found for this or any previous visit (from the past 240 hour(s)).  Radiology Reports Ct Abdomen Pelvis Wo Contrast  Result Date: 09/24/2018 CLINICAL DATA:  84 year old male with lower abdominal pain and bloody stool since yesterday. Sacral decubitus ulcer. EXAM: CT ABDOMEN AND PELVIS WITHOUT CONTRAST TECHNIQUE: Multidetector CT imaging of the abdomen and pelvis was performed following the standard protocol without IV contrast. COMPARISON:  Sacral MRI 07/10/2018. CT Abdomen and Pelvis 01/09/2012. FINDINGS: Lower chest: Negative aside from mild subpleural scarring in both lower lobes. No pericardial or pleural effusion. Calcified coronary artery atherosclerosis and/or stents. Hepatobiliary: Possible hyperdense sludge in the gallbladder. No CT evidence of acute cholecystitis. Increased liver density since 2013. No discrete liver lesion in the absence of contrast. Pancreas: Negative. Spleen: His see Adrenals/Urinary  Tract: Normal adrenal glands. Punctate nephrolithiasis on the right with no hydronephrosis and negative course of the right ureter. Chronic bulky left lower pole renal calculus, now 15 millimeters (series 3, image 39). No left hydronephrosis. Negative course of the left ureter. Numerous pelvic phleboliths. Unremarkable urinary bladder. Stomach/Bowel: Indistinct appearance of the descending, sigmoid colon and rectum which may contain fluid. Diverticulosis from the distal descending through the sigmoid segment. There is mild associated mesenteric stranding adjacent to the diverticula, generalized. No more focal distal large bowel inflammation is identified. The transverse colon and right colon have a more normal appearance. Negative appendix. Negative terminal ileum. No dilated small bowel. Decompressed stomach and duodenum. No free air, free fluid. Vascular/Lymphatic: Aortoiliac calcified atherosclerosis. Vascular patency is not evaluated in the absence of IV contrast. No lymphadenopathy. Reproductive: Negative. Other: No pelvic free fluid. Musculoskeletal: Sclerosis of the lower sacrum has developed since the 2013 CT compatible with sequelae of osteomyelitis as demonstrated in September. Eroded lower coccygeal segments. Mild erosion of the dorsal lower sacrum when compared to the 2013 CT. Overlying soft tissue thickening compatible  with residual decubitus wound encompassing an area of 8-10 centimeters (coronal image 132 and series 3, image 68). No tracking soft tissue gas. Chronic lumbar decompression and fusion hardware. Chronic postoperative changes to the medial left iliac bone. No new osseous abnormality identified. IMPRESSION: 1. Diverticulosis in the descending and sigmoid colon with mild diverticulitis difficult to exclude. Fluid in those large bowel segments, consider a diverticular source of lower GI bleeding. 2. 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 3.  Nephrolithiasis, bulky on the left. No evidence of obstructive uropathy. 4. Calcified coronary artery and Aortic Atherosclerosis (ICD10-I70.0). Electronically Signed   By: Genevie Ann M.D.   On: 09/24/2018 13:42    Time Spent in minutes  30   Lala Lund M.D on 09/25/2018 at 12:21 PM  To page go to [email or web address] - password [email or web address]

## 2018-09-26 DIAGNOSIS — E538 Deficiency of other specified B group vitamins: Secondary | ICD-10-CM

## 2018-09-26 DIAGNOSIS — I25119 Atherosclerotic heart disease of native coronary artery with unspecified angina pectoris: Secondary | ICD-10-CM

## 2018-09-26 LAB — CBC
HCT: 33 % — ABNORMAL LOW (ref 39.0–52.0)
Hemoglobin: 10.7 g/dL — ABNORMAL LOW (ref 13.0–17.0)
MCH: 28.5 pg (ref 26.0–34.0)
MCHC: 32.4 g/dL (ref 30.0–36.0)
MCV: 88 fL (ref 80.0–100.0)
Platelets: 220 10*3/uL (ref 150–400)
RBC: 3.75 MIL/uL — AB (ref 4.22–5.81)
RDW: 15.2 % (ref 11.5–15.5)
WBC: 8 10*3/uL (ref 4.0–10.5)
nRBC: 0 % (ref 0.0–0.2)

## 2018-09-26 LAB — BASIC METABOLIC PANEL
Anion gap: 6 (ref 5–15)
BUN: 21 mg/dL (ref 8–23)
CHLORIDE: 105 mmol/L (ref 98–111)
CO2: 29 mmol/L (ref 22–32)
CREATININE: 1.45 mg/dL — AB (ref 0.61–1.24)
Calcium: 9.1 mg/dL (ref 8.9–10.3)
GFR calc Af Amer: 51 mL/min — ABNORMAL LOW (ref >60)
GFR calc non Af Amer: 44 mL/min — ABNORMAL LOW (ref >60)
Glucose, Bld: 128 mg/dL — ABNORMAL HIGH (ref 70–99)
Potassium: 4.1 mmol/L (ref 3.5–5.1)
Sodium: 140 mmol/L (ref 135–145)

## 2018-09-26 MED ORDER — AMOXICILLIN-POT CLAVULANATE 500-125 MG PO TABS: 1.0000 | ORAL_TABLET | Freq: Three times a day (TID) | ORAL | 0 refills | Status: DC

## 2018-09-26 MED ORDER — DOCUSATE SODIUM 100 MG PO CAPS: 100.0000 mg | ORAL_CAPSULE | Freq: Two times a day (BID) | ORAL | 0 refills | Status: AC

## 2018-09-26 MED ORDER — APIXABAN 5 MG PO TABS: 5.0000 mg | ORAL_TABLET | Freq: Two times a day (BID) | ORAL | 11 refills | Status: DC

## 2018-09-26 NOTE — Progress Notes (Signed)
[facility] Gastroenterology Progress Note  [name] 84 y.o. November 23, 1930  CC: Lower GI bleed   Subjective: Patient denies abdominal pain today.  Afebrile.  Had 3 episodes of loose stool yesterday but no bowel movement today.  Discussed with the nursing staff.  No reported bleeding episodes.  ROS : Denies chest pain and shortness of breath.   Objective: Vital signs in last 24 hours: Vitals:   09/25/18 2225 09/26/18 0530  BP: (!) 134/55 (!) 148/74  Pulse: (!) 56 (!) 53  Resp: 18 16  Temp: 98 F (36.7 C) (!) 97.5 F (36.4 C)  SpO2: 98% 98%    Physical Exam:  General:  Alert, cooperative, no distress, appears stated age  Head:  Normocephalic, without obvious abnormality, atraumatic  Eyes:  , EOM's intact,   Lungs:   Clear to auscultation bilaterally, respirations unlabored  Heart:  Regular rate and rhythm, murmur present  Abdomen:   Soft, non-tender, nondistended, bowel sounds present.  Extremities:  Lower extremities normal, atraumatic, no  edema       Lab Results: Recent Labs    09/25/18 0202 09/26/18 0419  NA 141 140  K 3.9 4.1  CL 105 105  CO2 27 29  GLUCOSE 92 128*  BUN 18 21  CREATININE 1.28* 1.45*  CALCIUM 9.1 9.1   Recent Labs    09/24/18 1009  AST 46*  ALT 47*  ALKPHOS 84  BILITOT 1.0  PROT 6.5  ALBUMIN 3.4*   Recent Labs    09/24/18 0959  09/25/18 0202 09/25/18 0940 09/26/18 0419  WBC 7.5  --  8.5  --  8.0  NEUTROABS 4.7  --   --   --   --   HGB 11.1*   < > 10.5* 11.6* 10.7*  HCT 35.4*  --  33.3*  --  33.0*  MCV 90.1  --  89.0  --  88.0  PLT 253  --  241  --  220   < > = values in this interval not displayed.   Recent Labs    09/24/18 1028  LABPROT 15.6*  INR 1.25      Assessment/Plan: -Rectal bleeding.  Most likely diverticular in nature.  Resolved -Atrial fibrillation. Eliquis on hold  -Lower abdominal pain.  Resolved.  CT scan showed possible diverticulitis.  On  antibiotics.  Recommendations ------------------------ -Advance diet to soft. -Continue antibiotics.  May need to complete 10 to 14 days of course for possible diverticulitis. -Reevaluate need for anticoagulation in elderly patient who is at high risk for recurrent bleeding.  If anticoagulation clinically indicated, okay to resume from GI standpoint. -Hopefully discharge tomorrow if able to tolerate diet and if hemoglobin remains stable. -GI will follow   Otis Brace MD, [place] 09/26/2018, 8:10 AM  Contact #  [phone]

## 2018-09-26 NOTE — Progress Notes (Signed)
[name] discharged Home with home health per MD order.  Discharge instructions reviewed and discussed with the patient, all questions and concerns answered. Copy of instructions, care notes for new medication/diagnosis and scripts given to patient.  Allergies as of 09/26/2018      Reactions   Budesonide-formoterol Fumarate Swelling   Mouth and tongue swelling.   Iohexol Shortness Of Breath, Itching   Pt was given 100cc of Omnipaque 300 followed by itching/ dyspnea.   Ivp Dye [iodinated Diagnostic Agents] Shortness Of Breath, Other (See Comments)   Iodine pt had a reaction when he had a CT DONE   Simvastatin Other (See Comments)   REACTION: unknown   Demerol [meperidine] Other (See Comments)   REACTION: Hallucinations   Meperidine Hcl Other (See Comments)   REACTION: Hallucinations   Tape Other (See Comments)   Tears skin off, Please use "paper" tape   Naproxen Swelling   Pregabalin Rash   Sulfonamide Derivatives Rash      Medication List    TAKE these medications   acetaminophen 325 MG tablet Commonly known as:  TYLENOL Take 2 tablets (650 mg total) by mouth every 4 (four) hours as needed for headache or mild pain.   amiodarone 200 MG tablet Commonly known as:  PACERONE Take 0.5 tablets (100 mg total) by mouth daily.   amLODipine 2.5 MG tablet Commonly known as:  NORVASC Take 0.5 tablets (1.25 mg total) by mouth daily. What changed:  how much to take   amoxicillin-clavulanate 500-125 MG tablet Commonly known as:  AUGMENTIN Take 1 tablet (500 mg total) by mouth 3 (three) times daily.   apixaban 5 MG Tabs tablet Commonly known as:  ELIQUIS Take 1 tablet (5 mg total) by mouth 2 (two) times daily. Start taking on:  09/30/2018 What changed:  These instructions start on 09/30/2018. If you are unsure what to do until then, ask your doctor or other care provider.   atorvastatin 80 MG tablet Commonly known as:  LIPITOR Take 1 tablet (80 mg total) by mouth daily.    cyanocobalamin 1000 MCG tablet Take 1 tablet (1,000 mcg total) by mouth daily.   docusate sodium 100 MG capsule Commonly known as:  COLACE Take 1 capsule (100 mg total) by mouth 2 (two) times daily.   doxazosin 1 MG tablet Commonly known as:  CARDURA TAKE 1 TABLET BY MOUTH ONCE DAILY.   feeding supplement (ENSURE ENLIVE) Liqd Take 237 mLs by mouth 2 (two) times daily between meals.   HYDROcodone-acetaminophen 7.5-325 MG tablet Commonly known as:  NORCO Take 1 tablet by mouth every 4 (four) hours as needed for moderate pain. FOR PAIN   Melatonin 10 MG Tabs Take 10 mg by mouth at bedtime.   pantoprazole 40 MG tablet Commonly known as:  PROTONIX TAKE 1 TABLET BY MOUTH TWICE DAILY   SYMPROIC 0.2 MG Tabs Generic drug:  Naldemedine Tosylate Take 0.2 mg by mouth daily.   tamsulosin 0.4 MG Caps capsule Commonly known as:  FLOMAX Take 0.4 mg by mouth daily.       IV site discontinued and catheter remains intact. Site without signs and symptoms of complications. Dressing and pressure applied.  Patient escorted to car by NT/volunteer in a wheelchair,  no distress noted upon discharge.  [name], [name] 09/26/2018 1:03 PM

## 2018-09-26 NOTE — Care Management Note (Signed)
Case Management Note  Patient Details  Name: [name] MRN: [id number] Date of Birth: Jan 09, 1931  Subjective/Objective:  Admitted with GI bleed. Hx of a.fib. From home alone. Supportive family. PTA independent with ADL's, no DME usage. Owns cane , walker.             Maryan Rued (Daughter) Khairi Garman [phone]     PCP: Burman [facility]  Action/Plan:  Transition to home with home health services to follow.  Son,Jeff,to provide transportation to home.   Expected Discharge Date:  09/26/18               Expected Discharge Plan:  [place]  In-House Referral:  NA  Discharge planning Services  CM Consult  Post Acute Care Choice:    Choice offered to:  Patient, Adult Children  DME Arranged:  N/A DME Agency:  NA  HH Arranged:  RN, PT [place] Agency:  [facility]  Status of Service:  Completed, signed off  If discussed at [place] of Stay Meetings, dates discussed:    Additional Comments:  Sharin Mons, RN 09/26/2018, 11:35 AM

## 2018-09-26 NOTE — Discharge Instructions (Signed)
Follow with Primary MD [name], FNP in 7 days   Get CBC, CMP  checked  by Primary MD  in 5-7 days   Activity: As tolerated with Full fall precautions use walker/cane & assistance as needed  Disposition Home    Diet: Soft diet for the next 3 to 4 days then advance to regular consistency heart healthy diet as tolerated  Special Instructions: If you have smoked or chewed Tobacco  in the last 2 yrs please stop smoking, stop any regular Alcohol  and or any Recreational drug use.  On your next visit with your primary care physician please Get Medicines reviewed and adjusted.  Please request your Prim.MD to go over all Hospital Tests and Procedure/Radiological results at the follow up, please get all Hospital records sent to your Prim MD by signing hospital release before you go home.  If you experience worsening of your admission symptoms, develop shortness of breath, life threatening emergency, suicidal or homicidal thoughts you must seek medical attention immediately by calling 911 or calling your MD immediately  if symptoms less severe.  You Must read complete instructions/literature along with all the possible adverse reactions/side effects for all the Medicines you take and that have been prescribed to you. Take any new Medicines after you have completely understood and accpet all the possible adverse reactions/side effects.

## 2018-09-26 NOTE — Discharge Summary (Signed)
DEADRIAN [name] SWN:[id number] DOB: [birth year] DOA: 09/24/2018  PCP: [name] Faster, FNP  Admit date: 09/24/2018  Discharge date: 09/26/2018  Admitted From: Home   Disposition:  Home   Recommendations for Outpatient Follow-up:   Follow up with PCP in 1-2 weeks  PCP Please obtain BMP/CBC, 2 view CXR in 1week,  (see Discharge instructions)   PCP Please follow up on the following pending results: H&H monitor   Home Health: PT,RN   Equipment/Devices: None  Consultations: GI Discharge Condition: Stable   CODE STATUS: Full   Diet Recommendation: Soft Diet for 3 to 4 days and advance to regular consistency heart healthy diet as tolerated   Chief Complaint  Patient presents with  . Rectal Bleeding     Brief history of present illness from the day of admission and additional interim summary    AlvinRobertsis a48 y.o.male,with past medical history significant for A. fib on Eliquis presenting today with 2 days history of bright red blood per rectum associated with abdominal pain, left lower quadrant. No fever or chills, no nausea and vomiting. Patient usually follows with [facility], however no history of endoscopy lately .In the emergency room the patient had a CT of the abdomen which showed no diverticulitis, but diverticulosis in the descending and sigmoid colon with fluid in these segments suspicious for diverticular source of lower GI bleed. His hemoglobin was 11.1. Dr. Watt Climes from GI was consulted and he will follow-up with the patient on consult.                                                                 Hospital Course   1.  Lower GI bleed likely diverticular.  Minimal acute blood loss like anemia, and H remained stable and he did not need any transfusion, bleeding resolved within 24 hours, he  did have lower abdominal pain and some cramping suggesting some diverticulitis which was also evident on CT scan, he was kept here on Rocephin and Flagyl and will be transitioned to 7 days of oral Augmentin, currently symptom-free will be discharged home on soft diet with gradual advancement as tolerated home with stool softeners.  Requested him to resume his anticoagulation for A. fib in the next 3 to 4 days, if bleeding resumes probably will have to reconsider long-term anticoagulation use based on risks and benefits.  Outpatient GI follow-up recommended in 1 to 2 weeks.  PCP to monitor H&H.  2.  Paroxysmal atrial fibrillation.  [facility] to of at least 3.  Continue amiodarone, he is on Eliquis which is currently on hold due to #1 above.  He understands the risks and benefits of holding anticoagulation, plan as above.  3.  BPH.  On alpha-blocker continue.  4.  CAD.  No acute issues continue statin for secondary  prevention.  5. GERD.  On PPI.  6.  Incidental finding of left renal stone.  No obstruction.  No acute issue.  7.  CKD 4.  Baseline creatinine appears to be close to 1.4 going back to November 2019, currently at baseline PCP to monitor.    Discharge diagnosis     Principal Problem:   GI bleed Active Problems:   CAD (coronary artery disease)   B12 deficiency   Atrial fibrillation [name])    Discharge instructions    Discharge Instructions    Discharge instructions   Complete by:  As directed    Follow with Primary MD [name] Faster, FNP in 7 days   Get CBC, CMP  checked  by Primary MD  in 5-7 days   Activity: As tolerated with Full fall precautions use walker/cane & assistance as needed  Disposition Home    Diet: Soft diet for the next 3 to 4 days then advance to regular consistency heart healthy diet as tolerated  Special Instructions: If you have smoked or chewed Tobacco  in the last 2 yrs please stop smoking, stop any regular Alcohol  and or any  Recreational drug use.  On your next visit with your primary care physician please Get Medicines reviewed and adjusted.  Please request your Prim.MD to go over all Hospital Tests and Procedure/Radiological results at the follow up, please get all Hospital records sent to your Prim MD by signing hospital release before you go home.  If you experience worsening of your admission symptoms, develop shortness of breath, life threatening emergency, suicidal or homicidal thoughts you must seek medical attention immediately by calling 911 or calling your MD immediately  if symptoms less severe.  You Must read complete instructions/literature along with all the possible adverse reactions/side effects for all the Medicines you take and that have been prescribed to you. Take any new Medicines after you have completely understood and accpet all the possible adverse reactions/side effects.   Increase activity slowly   Complete by:  As directed       Discharge Medications   Allergies as of 09/26/2018      Reactions   Budesonide-formoterol Fumarate Swelling   Mouth and tongue swelling.   Iohexol Shortness Of Breath, Itching   Pt was given 100cc of Omnipaque 300 followed by itching/ dyspnea.   Ivp Dye [iodinated Diagnostic Agents] Shortness Of Breath, Other (See Comments)   Iodine pt had a reaction when he had a CT DONE   Simvastatin Other (See Comments)   REACTION: unknown   Demerol [meperidine] Other (See Comments)   REACTION: Hallucinations   Meperidine Hcl Other (See Comments)   REACTION: Hallucinations   Tape Other (See Comments)   Tears skin off, Please use "paper" tape   Naproxen Swelling   Pregabalin Rash   Sulfonamide Derivatives Rash      Medication List    TAKE these medications   acetaminophen 325 MG tablet Commonly known as:  TYLENOL Take 2 tablets (650 mg total) by mouth every 4 (four) hours as needed for headache or mild pain.   amiodarone 200 MG tablet Commonly known as:   PACERONE Take 0.5 tablets (100 mg total) by mouth daily.   amLODipine 2.5 MG tablet Commonly known as:  NORVASC Take 0.5 tablets (1.25 mg total) by mouth daily. What changed:  how much to take   amoxicillin-clavulanate 500-125 MG tablet Commonly known as:  AUGMENTIN Take 1 tablet (500 mg total) by mouth 3 (three) times  daily.   apixaban 5 MG Tabs tablet Commonly known as:  ELIQUIS Take 1 tablet (5 mg total) by mouth 2 (two) times daily. Start taking on:  09/30/2018 What changed:  These instructions start on 09/30/2018. If you are unsure what to do until then, ask your doctor or other care provider.   atorvastatin 80 MG tablet Commonly known as:  LIPITOR Take 1 tablet (80 mg total) by mouth daily.   cyanocobalamin 1000 MCG tablet Take 1 tablet (1,000 mcg total) by mouth daily.   docusate sodium 100 MG capsule Commonly known as:  COLACE Take 1 capsule (100 mg total) by mouth 2 (two) times daily.   doxazosin 1 MG tablet Commonly known as:  CARDURA TAKE 1 TABLET BY MOUTH ONCE DAILY.   feeding supplement (ENSURE ENLIVE) Liqd Take 237 mLs by mouth 2 (two) times daily between meals.   HYDROcodone-acetaminophen 7.5-325 MG tablet Commonly known as:  NORCO Take 1 tablet by mouth every 4 (four) hours as needed for moderate pain. FOR PAIN   Melatonin 10 MG Tabs Take 10 mg by mouth at bedtime.   pantoprazole 40 MG tablet Commonly known as:  PROTONIX TAKE 1 TABLET BY MOUTH TWICE DAILY   SYMPROIC 0.2 MG Tabs Generic drug:  Naldemedine Tosylate Take 0.2 mg by mouth daily.   tamsulosin 0.4 MG Caps capsule Commonly known as:  FLOMAX Take 0.4 mg by mouth daily.       Follow-up Information    [name] Faster, FNP. Schedule an appointment as soon as possible for a visit in 1 week(s).   Specialty:  Family Medicine Contact information: [address] [place] [address] [phone]        Fay Records, MD .   Specialty:  Cardiology Contact information: [address] [place] [address] [phone]        Clarene Essex, MD. Schedule an appointment as soon as possible for a visit in 1 week(s).   Specialty:  Gastroenterology Contact information: [address] [place] [address] [phone]           Major procedures and Radiology Reports - PLEASE review detailed and final reports thoroughly  -        Ct Abdomen Pelvis Wo Contrast  Result Date: 09/24/2018 CLINICAL DATA:  84 year old male with lower abdominal pain and bloody stool since yesterday. Sacral decubitus ulcer. EXAM: CT ABDOMEN AND PELVIS WITHOUT CONTRAST TECHNIQUE: Multidetector CT imaging of the abdomen and pelvis was performed following the standard protocol without IV contrast. COMPARISON:  Sacral MRI 07/10/2018. CT Abdomen and Pelvis 01/09/2012. FINDINGS: Lower chest: Negative aside from mild subpleural scarring in both lower lobes. No pericardial or pleural effusion. Calcified coronary artery atherosclerosis and/or stents. Hepatobiliary: Possible hyperdense sludge in the gallbladder. No CT evidence of acute cholecystitis. Increased liver density since 2013. No discrete liver lesion in the absence of contrast. Pancreas: Negative. Spleen: His see Adrenals/Urinary Tract: Normal adrenal glands. Punctate nephrolithiasis on the right with no hydronephrosis and negative course of the right ureter. Chronic bulky left lower pole renal calculus, now 15 millimeters (series 3, image 39). No left hydronephrosis. Negative course of the left ureter. Numerous pelvic phleboliths. Unremarkable urinary bladder. Stomach/Bowel: Indistinct appearance of the descending, sigmoid colon and rectum which may contain fluid. Diverticulosis from the distal descending through the sigmoid segment. There is mild associated mesenteric stranding adjacent to the diverticula, generalized. No more focal distal large bowel inflammation is identified. The transverse  colon and right  colon have a more normal appearance. Negative appendix. Negative terminal ileum. No dilated small bowel. Decompressed stomach and duodenum. No free air, free fluid. Vascular/Lymphatic: Aortoiliac calcified atherosclerosis. Vascular patency is not evaluated in the absence of IV contrast. No lymphadenopathy. Reproductive: Negative. Other: No pelvic free fluid. Musculoskeletal: Sclerosis of the lower sacrum has developed since the 2013 CT compatible with sequelae of osteomyelitis as demonstrated in September. Eroded lower coccygeal segments. Mild erosion of the dorsal lower sacrum when compared to the 2013 CT. Overlying soft tissue thickening compatible with residual decubitus wound encompassing an area of 8-10 centimeters (coronal image 132 and series 3, image 68). No tracking soft tissue gas. Chronic lumbar decompression and fusion hardware. Chronic postoperative changes to the medial left iliac bone. No new osseous abnormality identified. IMPRESSION: 1. Diverticulosis in the descending and sigmoid colon with mild diverticulitis difficult to exclude. Fluid in those large bowel segments, consider a diverticular source of lower GI bleeding. 2. 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 3. Nephrolithiasis, bulky on the left. No evidence of obstructive uropathy. 4. Calcified coronary artery and Aortic Atherosclerosis (ICD10-I70.0). Electronically Signed   By: Genevie Ann M.D.   On: 09/24/2018 13:42    Micro Results    Recent Results (from the past 240 hour(s))  MRSA PCR Screening     Status: None   Collection Time: 09/25/18  2:37 PM  Result Value Ref Range Status   MRSA by PCR NEGATIVE NEGATIVE Final    Comment:        The GeneXpert MRSA Assay (FDA approved for NASAL specimens only), is one component of a comprehensive MRSA colonization surveillance program. It is not intended to diagnose MRSA infection nor to guide or monitor treatment for MRSA  infections. Performed at [facility], [place] [name], [place], [place] [address]     Today   Subjective    [name] today has no headache,no chest abdominal pain,no new weakness tingling or numbness, feels much better wants to go home today.    Objective   Blood pressure (!) 148/74, pulse (!) 53, temperature (!) 97.5 F (36.4 C), temperature source Oral, resp. rate 16, weight 78.9 kg, SpO2 98 %.   Intake/Output Summary (Last 24 hours) at 09/26/2018 0848 Last data filed at 09/26/2018 0700 Gross per 24 hour  Intake 760 ml  Output 540 ml  Net 220 ml    Exam Awake Alert, Oriented x 3, No new F.N deficits, Normal affect [place].AT,PERRAL Supple Neck,No JVD, No cervical lymphadenopathy appriciated.  Symmetrical Chest wall movement, Good air movement bilaterally, CTAB RRR,No Gallops,Rubs or new Murmurs, No Parasternal Heave +ve B.Sounds, Abd Soft, Non tender, No organomegaly appriciated, No rebound -guarding or rigidity. No Cyanosis, Clubbing or edema, No new Rash or bruise   Data Review   CBC w Diff:  Lab Results  Component Value Date   WBC 8.0 09/26/2018   HGB 10.7 (L) 09/26/2018   HGB 11.8 (L) 09/11/2018   HCT 33.0 (L) 09/26/2018   HCT 36.0 (L) 09/11/2018   PLT 220 09/26/2018   PLT 286 09/11/2018   LYMPHOPCT 21 09/24/2018   MONOPCT 8 09/24/2018   EOSPCT 7 09/24/2018   BASOPCT 1 09/24/2018    CMP:  Lab Results  Component Value Date   NA 140 09/26/2018   NA 141 09/11/2018   K 4.1 09/26/2018   CL 105 09/26/2018   CO2 29 09/26/2018   BUN 21 09/26/2018  BUN 22 09/11/2018   CREATININE 1.45 (H) 09/26/2018   CREATININE 0.89 03/22/2018   PROT 6.5 09/24/2018   PROT 6.6 04/12/2018   ALBUMIN 3.4 (L) 09/24/2018   ALBUMIN 3.4 (L) 04/12/2018   BILITOT 1.0 09/24/2018   BILITOT 0.3 04/12/2018   ALKPHOS 84 09/24/2018   AST 46 (H) 09/24/2018   ALT 47 (H) 09/24/2018  .   Total Time in preparing paper work, data evaluation and todays exam - 67  minutes  Lala Lund M.D on 09/26/2018 at 8:48 AM  [facility]   Office  [phone]

## 2018-10-02 ENCOUNTER — Emergency Department
Admission: EM | Admit: 2018-10-02 | Discharge: 2018-10-02 | Disposition: A | Discharge status: Home / Self Care | Payer: Medicare Other | Attending: Emergency Medicine | Admitting: Emergency Medicine

## 2018-10-02 ENCOUNTER — Emergency Department: Payer: Medicare Other

## 2018-10-02 ENCOUNTER — Other Ambulatory Visit: Payer: Self-pay

## 2018-10-02 DIAGNOSIS — I2510 Atherosclerotic heart disease of native coronary artery without angina pectoris: Secondary | ICD-10-CM | POA: Diagnosis not present

## 2018-10-02 DIAGNOSIS — M5030 Other cervical disc degeneration, unspecified cervical region: Secondary | ICD-10-CM | POA: Diagnosis not present

## 2018-10-02 DIAGNOSIS — Z79899 Other long term (current) drug therapy: Secondary | ICD-10-CM | POA: Insufficient documentation

## 2018-10-02 DIAGNOSIS — J45909 Unspecified asthma, uncomplicated: Secondary | ICD-10-CM | POA: Insufficient documentation

## 2018-10-02 DIAGNOSIS — I10 Essential (primary) hypertension: Secondary | ICD-10-CM | POA: Insufficient documentation

## 2018-10-02 DIAGNOSIS — M542 Cervicalgia: Secondary | ICD-10-CM

## 2018-10-02 DIAGNOSIS — I252 Old myocardial infarction: Secondary | ICD-10-CM | POA: Insufficient documentation

## 2018-10-02 MED ORDER — DIAZEPAM 5 MG PO TABS
ORAL_TABLET | ORAL | Status: AC
  Filled 2018-10-02: qty 1

## 2018-10-02 MED ORDER — DIAZEPAM 5 MG PO TABS
5.0000 mg | ORAL_TABLET | Freq: Once | ORAL | Status: AC
  Administered 2018-10-02: 5 mg via ORAL

## 2018-10-02 MED ORDER — TRAMADOL HCL 50 MG PO TABS
50.0000 mg | ORAL_TABLET | Freq: Once | ORAL | Status: AC
  Administered 2018-10-02: 50 mg via ORAL
  Filled 2018-10-02: qty 1

## 2018-10-02 MED ORDER — TRAMADOL HCL 50 MG PO TABS: 50.0000 mg | ORAL_TABLET | Freq: Two times a day (BID) | ORAL | 0 refills | Status: AC

## 2018-10-02 NOTE — ED Triage Notes (Signed)
Pt arrived via POV with neck pain that started the day before. Pt denies injury to neck. Pt NAD presently.

## 2018-10-02 NOTE — Discharge Instructions (Signed)
Your exam, XR, and MRI shows some mild progression of your underlying degenerative disc disease and spinal stenosis. Your should take the pain medicine as needed. Follow-up with Dr. Ellene Route for ongoing management.

## 2018-10-02 NOTE — ED Provider Notes (Signed)
[facility] Emergency Department Provider Note ____________________________________________  Time seen: 1522  I have reviewed the triage vital signs and the nursing notes.  HISTORY  Chief Complaint  Torticollis and Back Pain  HPI [name] is a 84 y.o. male who presents to the ED accompanied by his adult son, for evaluation of a 3-day complaint of anterior neck pain and disability. He lives alone, but denies any recent injury, accident, or fall. He has a remote history of ACDF C3-7 per Dr. Ellene Route. He has been stable and denies any distal paresthesias, grip changes, or UE weakness. He describes anterior neck pain that is increased with attempts to rotate the neck or raise the head. He also notes some pain across the lower jaw and chin. He denies any difficulty eating, swallowing, or controlling oral secretions. He denies any dental abscess, gum swelling, or fullness to the floor of mouth. He dosed a single hydrocodone he had, at the onset of symptoms, but denies any self-care since. He called Dr. Clarice Pole office, who is unable to see the patient until next month. Dr. Ellene Route suggested he report to the ED for emergent MRI.   Past Medical History:  Diagnosis Date  . Arthritis    "hips, back" (06/17/2015)  . Asthma   . Atrial fibrillation ([place]) 01/21/2018  . Chronic chest pain   . Chronic lower back pain   . Coronary artery disease    a. s/p BMS to RCA in 2002; b. s/p cutting balloon POBA ;   c. cath 6/12: oDx 80% (treated with repeat cutting balloon POBA), mLAD 50% with 30-40% at Dx, CFX 30%, pRCA 25% with patent stents;  d.  Lex MV 4/14:  Low Risk - EF 61%, inf scar with peri-infarct ischemia  . Dyspnea    chronic  . Essential hypertension   . GERD (gastroesophageal reflux disease)    h/o esophageal spasm  . GI bleed 03/03/2014  . Headache   . History of blood transfusion    "related to OR"  . History of hiatal hernia   . Hyperlipidemia   . Hypertension   .  Melanoma of lower back ([place]) late 1990's  . Memory loss   . Myocardial infarction ([place]) 2001   x 1, confirned 1 possible  . Pre-syncope 07/31/2017    Patient Active Problem List   Diagnosis Date Noted  . GI bleed 09/24/2018  . PICC (peripherally inserted central catheter) in place 07/12/2018  . Medication monitoring encounter 03/22/2018  . Malnutrition of moderate degree 01/22/2018  . Sacral decubitus ulcer, stage IV, healing  01/22/2018  . Atrial fibrillation ([place]) 01/21/2018  . Unresponsive episode 01/20/2018  . Elevated troponin 01/20/2018  . Pre-syncope 07/31/2017  . B12 deficiency 06/19/2017  . Essential hypertension   . Bladder outlet obstruction   . Generalized weakness 03/03/2014  . ESOPHAGEAL STRICTURE 09/20/2010  . ORTHOSTATIC DIZZINESS 08/30/2010  . IBS 08/26/2010  . RECTAL BLEEDING 08/26/2010  . RECTAL PAIN 08/26/2010  . ARTHRITIS 08/26/2010  . LOSS OF APPETITE 08/26/2010  . OTHER DYSPHAGIA 08/26/2010  . COLONIC POLYPS, HX OF 08/26/2010  . PALPITATIONS, HX OF 08/18/2010  . PERIPHERAL NEUROPATHY 05/01/2009  . History of myocardial infarction 05/01/2009  . ALLERGIC RHINITIS, SEASONAL 05/01/2009  . DEPRESSION, HX OF 05/01/2009  . Personal history of other diseases of digestive system 05/01/2009  . NEPHROLITHIASIS, HX OF 05/01/2009  . BENIGN PROSTATIC HYPERTROPHY, HX OF 05/01/2009  . LAMINECTOMY, HX OF 05/01/2009  . Hyperlipidemia 10/20/2008  . CAD (  coronary artery disease) 10/20/2008  . GERD 10/20/2008    Past Surgical History:  Procedure Laterality Date  . ANTERIOR CERVICAL DECOMP/DISCECTOMY FUSION    . BACK SURGERY  multiple  . CARDIAC CATHETERIZATION     "I've had 17 caths" (06/17/2015)  . CATARACT EXTRACTION W/ INTRAOCULAR LENS  IMPLANT, BILATERAL Bilateral   . CERVICAL DISC SURGERY  multiple  . COLONOSCOPY WITH PROPOFOL N/A 04/17/2014   Procedure: COLONOSCOPY WITH PROPOFOL;  Surgeon: Winfield Cunas., MD;  Location: [facility] ENDOSCOPY;  Service:  Endoscopy;  Laterality: N/A;  . CORONARY ANGIOPLASTY WITH STENT PLACEMENT  x 2 stents    previous percutaneous intervention on the  RCA and the diagonal branch  . ESOPHAGOGASTRODUODENOSCOPY  03/23/2012   Procedure: ESOPHAGOGASTRODUODENOSCOPY (EGD);  Surgeon: Winfield Cunas., MD;  Location: [facility];  Service: Endoscopy;  Laterality: N/A;  . ESOPHAGOGASTRODUODENOSCOPY N/A 03/04/2014   Procedure: ESOPHAGOGASTRODUODENOSCOPY (EGD);  Surgeon: Arta Silence, MD;  Location: [facility] ENDOSCOPY;  Service: Endoscopy;  Laterality: N/A;  . LAMINECTOMY    . LEFT HEART CATHETERIZATION WITH CORONARY ANGIOGRAM N/A 01/13/2014   Procedure: LEFT HEART CATHETERIZATION WITH CORONARY ANGIOGRAM;  Surgeon: Blane Ohara, MD;  Location: [facility] CATH LAB;  Service: Cardiovascular;  Laterality: N/A;  . MELANOMA EXCISION  late 1990's   "lower back"  . POSTERIOR LAMINECTOMY / DECOMPRESSION CERVICAL SPINE    . SAVORY DILATION  03/23/2012   Procedure: SAVORY DILATION;  Surgeon: Winfield Cunas., MD;  Location: [facility];  Service: Endoscopy;  Laterality: N/A;  . TONSILLECTOMY  1930's    Prior to Admission medications   Medication Sig Start Date End Date Taking? Authorizing Provider  acetaminophen (TYLENOL) 325 MG tablet Take 2 tablets (650 mg total) by mouth every 4 (four) hours as needed for headache or mild pain. Patient not taking: Reported on 09/24/2018 12/16/13   Erlene Quan, PA-C  amiodarone (PACERONE) 200 MG tablet Take 0.5 tablets (100 mg total) by mouth daily. 09/11/18   Fay Records, MD  amLODipine (NORVASC) 2.5 MG tablet Take 0.5 tablets (1.25 mg total) by mouth daily. Patient taking differently: Take 2.5 mg by mouth daily.  09/12/18   Fay Records, MD  amoxicillin-clavulanate (AUGMENTIN) 500-125 MG tablet Take 1 tablet (500 mg total) by mouth 3 (three) times daily. 09/26/18   Thurnell Lose, MD  apixaban (ELIQUIS) 5 MG TABS tablet Take 1 tablet (5 mg total) by mouth 2 (two) times daily. 09/30/18    Thurnell Lose, MD  atorvastatin (LIPITOR) 80 MG tablet Take 1 tablet (80 mg total) by mouth daily. 12/27/17 09/24/18  Richardson Dopp T, PA-C  docusate sodium (COLACE) 100 MG capsule Take 1 capsule (100 mg total) by mouth 2 (two) times daily. 09/26/18 10/26/18  Thurnell Lose, MD  doxazosin (CARDURA) 1 MG tablet TAKE 1 TABLET BY MOUTH ONCE DAILY. Patient taking differently: Take 1 mg by mouth daily.  06/01/17   Fay Records, MD  feeding supplement, ENSURE ENLIVE, (ENSURE ENLIVE) LIQD Take 237 mLs by mouth 2 (two) times daily between meals. 08/02/17   Fritzi Mandes, MD  HYDROcodone-acetaminophen (NORCO) 7.5-325 MG tablet Take 1 tablet by mouth every 4 (four) hours as needed for moderate pain. FOR PAIN Patient not taking: Reported on 09/24/2018 01/24/18   Gladstone Lighter, MD  Melatonin 10 MG TABS Take 10 mg by mouth at bedtime.    [provider]  pantoprazole (PROTONIX) 40 MG tablet TAKE 1 TABLET BY MOUTH TWICE DAILY Patient taking differently:  Take 40 mg by mouth 2 (two) times daily.  06/01/17   Fay Records, MD  SYMPROIC 0.2 MG TABS Take 0.2 mg by mouth daily. 09/11/18   [provider]  tamsulosin (FLOMAX) 0.4 MG CAPS capsule Take 0.4 mg by mouth daily.  01/13/15   [provider]  traMADol (ULTRAM) 50 MG tablet Take 1 tablet (50 mg total) by mouth 2 (two) times daily for 7 days. 10/02/18 10/09/18  Britani Beattie, Dannielle Karvonen, PA-C  vitamin B-12 1000 MCG tablet Take 1 tablet (1,000 mcg total) by mouth daily. 06/21/17   Geradine Girt, DO    Allergies Budesonide-formoterol fumarate; Iohexol; Ivp dye [iodinated diagnostic agents]; Simvastatin; Demerol [meperidine]; Meperidine hcl; Tape; Naproxen; Pregabalin; and Sulfonamide derivatives  Family History  Problem Relation Age of Onset  . Diabetes Father   . Heart disease Father   . Asthma Father   . Heart disease Mother        CABG hx age 69  . Colon cancer Son        hx   . Colitis Son        hx  . Crohn's disease  Son   . Prostate cancer Paternal Grandfather     Social History Social History   Tobacco Use  . Smoking status: Never Smoker  . Smokeless tobacco: Never Used  Substance Use Topics  . Alcohol use: No  . Drug use: No    Review of Systems  Constitutional: Negative for fever. Eyes: Negative for visual changes. ENT: Negative for sore throat. Cardiovascular: Negative for chest pain. Respiratory: Negative for shortness of breath. Gastrointestinal: Negative for abdominal pain, vomiting and diarrhea. Genitourinary: Negative for dysuria. Musculoskeletal: Negative for back pain. Reports anterior neck pain & disability as above.  Skin: Negative for rash. Neurological: Negative for headaches, focal weakness or numbness. ____________________________________________  PHYSICAL EXAM:  VITAL SIGNS: ED Triage Vitals  Enc Vitals Group     BP 10/02/18 1340 132/70     Pulse Rate 10/02/18 1340 69     Resp 10/02/18 1340 16     Temp 10/02/18 1340 98.7 F (37.1 C)     Temp Source 10/02/18 1340 Oral     SpO2 10/02/18 1339 100 %     Weight 10/02/18 1340 174 lb (78.9 kg)     Height 10/02/18 1340 5\' 6"  (1.676 m)     Head Circumference --      Peak Flow --      Pain Score 10/02/18 1340 10     Pain Loc --      Pain Edu? --      Excl. in [place]? --     Constitutional: Alert and oriented. Well appearing and in no distress. Head: Normocephalic and atraumatic. Eyes: Conjunctivae are normal. PERRL. Normal extraocular movements Ears: Canals clear. TMs intact bilaterally. Nose: No congestion/rhinorrhea/epistaxis. Mouth/Throat: Mucous membranes are moist. Neck: Supple. No thyromegaly. Hematological/Lymphatic/Immunological: No cervical lymphadenopathy. Cardiovascular: Normal rate, regular rhythm. Normal distal pulses. Respiratory: Normal respiratory effort. No wheezes/rales/rhonchi. Gastrointestinal: Soft and nontender. No distention. Musculoskeletal: normal spinal alignment without posterior  midline tenderness, spasm, deformity, or step-off. Decreased cervical ROM at baseline. patient with some fixed neck flexion. He is hesitant to rotate the head due to pain. Nontender with normal range of motion in all extremities.  Neurologic: CN II-XII grossly intact. Normal UE DTRs bilaterally. Normal gait without ataxia. Normal speech and language. No gross focal neurologic deficits are appreciated. Skin:  Skin is warm, dry and intact. No  rash noted. Psychiatric: Mood and affect are normal. Patient exhibits appropriate insight and judgment. ____________________________________________   RADIOLOGY  DG Cervical Spine IMPRESSION: 1.  No acute osseous injury of the cervical spine.  MRI Cervical Spine IMPRESSION: 1. Right C2-3 facet edema, likely degenerative, less likely infectious. Edema within upper cervical paraspinal muscles may be reactive or reflect muscle strain. C2-3 interspinous edema, probably cervical variant of [place]'s disease. 2. No acute fracture or cord signal abnormality. 3. Progression of cervical spondylosis at the C2-3 level and increased size of C1-2 odontoid pannus. Stable cervical spondylosis at additional levels. 4. C1-2 odontoid pannus results in worsening moderate spinal canal stenosis and cord impingement. Stable moderate C2-3 multifactorial spinal canal stenosis. Stable mild T1-2 spinal canal stenosis. 5. Severe right C2-3 foraminal stenosis. Multilevel mild and moderate neural foraminal stenosis. ____________________________________________  PROCEDURES  Procedures Ultram 50 mg PO Valium 5 mg - preprocedure ____________________________________________  INITIAL IMPRESSION / ASSESSMENT AND PLAN / ED COURSE  Geriatric patient with ED evaluation of a 2 to 3-day complaint of anterior neck pain.  Patient's exam is reassuring at this time as it shows no acute neuromuscular deficit despite his chronic degenerative disc disease and ACDF.  His MRI shows some  mild progression of his chronic changes.  He will be discharged with a prescription for Ultram. He is referred to Dr. Ellene Route for ongoing management.   I reviewed the patient's prescription history over the last 12 months in the multi-state controlled substances database(s) that includes [place], [place], [place], [place], [place], [place], [place], [place], [place], [place], [place], [place], [place], and [place].  Results were notable for no current prescriptions.  ____________________________________________  FINAL CLINICAL IMPRESSION(S) / ED DIAGNOSES  Final diagnoses:  DDD (degenerative disc disease), cervical  Nontraumatic neck pain      Tyresa Prindiville, Dannielle Karvonen, PA-C 10/02/18 1805    Orbie Pyo, MD 10/02/18 2043

## 2018-10-04 ENCOUNTER — Other Ambulatory Visit: Payer: Medicare Other

## 2018-10-06 ENCOUNTER — Emergency Department
Admission: EM | Admit: 2018-10-06 | Discharge: 2018-10-06 | Disposition: A | Discharge status: Home / Self Care | Payer: Medicare Other | Attending: Emergency Medicine | Admitting: Emergency Medicine

## 2018-10-06 ENCOUNTER — Other Ambulatory Visit: Payer: Self-pay

## 2018-10-06 DIAGNOSIS — I10 Essential (primary) hypertension: Secondary | ICD-10-CM | POA: Insufficient documentation

## 2018-10-06 DIAGNOSIS — J45909 Unspecified asthma, uncomplicated: Secondary | ICD-10-CM | POA: Diagnosis not present

## 2018-10-06 DIAGNOSIS — R531 Weakness: Secondary | ICD-10-CM | POA: Diagnosis not present

## 2018-10-06 DIAGNOSIS — Z7901 Long term (current) use of anticoagulants: Secondary | ICD-10-CM | POA: Diagnosis not present

## 2018-10-06 DIAGNOSIS — I252 Old myocardial infarction: Secondary | ICD-10-CM | POA: Insufficient documentation

## 2018-10-06 DIAGNOSIS — I2510 Atherosclerotic heart disease of native coronary artery without angina pectoris: Secondary | ICD-10-CM | POA: Diagnosis not present

## 2018-10-06 DIAGNOSIS — Z79899 Other long term (current) drug therapy: Secondary | ICD-10-CM | POA: Diagnosis not present

## 2018-10-06 LAB — CBC
HEMATOCRIT: 33.4 % — AB (ref 39.0–52.0)
Hemoglobin: 10.9 g/dL — ABNORMAL LOW (ref 13.0–17.0)
MCH: 28.8 pg (ref 26.0–34.0)
MCHC: 32.6 g/dL (ref 30.0–36.0)
MCV: 88.4 fL (ref 80.0–100.0)
Platelets: 325 10*3/uL (ref 150–400)
RBC: 3.78 MIL/uL — ABNORMAL LOW (ref 4.22–5.81)
RDW: 14.5 % (ref 11.5–15.5)
WBC: 12.6 10*3/uL — ABNORMAL HIGH (ref 4.0–10.5)
nRBC: 0 % (ref 0.0–0.2)

## 2018-10-06 LAB — BASIC METABOLIC PANEL
Anion gap: 11 (ref 5–15)
BUN: 20 mg/dL (ref 8–23)
CHLORIDE: 97 mmol/L — AB (ref 98–111)
CO2: 25 mmol/L (ref 22–32)
CREATININE: 1.1 mg/dL (ref 0.61–1.24)
Calcium: 9.1 mg/dL (ref 8.9–10.3)
GFR calc Af Amer: >60 mL/min (ref >60)
GFR calc non Af Amer: >60 mL/min (ref >60)
Glucose, Bld: 152 mg/dL — ABNORMAL HIGH (ref 70–99)
Potassium: 4.3 mmol/L (ref 3.5–5.1)
Sodium: 133 mmol/L — ABNORMAL LOW (ref 135–145)

## 2018-10-06 LAB — URINALYSIS, COMPLETE (UACMP) WITH MICROSCOPIC
Bilirubin Urine: NEGATIVE
Glucose, UA: NEGATIVE mg/dL
Ketones, ur: NEGATIVE mg/dL
Nitrite: NEGATIVE
Protein, ur: NEGATIVE mg/dL
Specific Gravity, Urine: 1.018 (ref 1.005–1.030)
pH: 5 (ref 5.0–8.0)

## 2018-10-06 LAB — TROPONIN I

## 2018-10-06 LAB — CK: Total CK: 37 U/L — ABNORMAL LOW (ref 49–397)

## 2018-10-06 MED ORDER — SODIUM CHLORIDE 0.9 % IV BOLUS
500.0000 mL | Freq: Once | INTRAVENOUS | Status: AC
  Administered 2018-10-06: 500 mL via INTRAVENOUS

## 2018-10-06 NOTE — ED Triage Notes (Addendum)
Pt arrives via ems from home, pt was found by family to be seated in the same place by family pt c/o generalized weakness and dysuria. Pt is incontinent of stool and urine according to ems. Pt had a fsbs of 153, bp 100/60, oral temp of 99, upon assessment pt is clean and dry, does have redness noted to his skin and scrotum

## 2018-10-06 NOTE — Progress Notes (Signed)
LCSW concluded with patients kids and they are going to provide 24/7 care and will access the various resources I gave them.  They were in addition provided dementia services, eldercare hand outs and Locked facilities. The family reports they have a neurologist appointment on January 23rd and will go from there. The patient was very concerned about talking to his kids and and seemed very guarded. He thanked Risk manager for being kind and accepted the handouts I gave his family.  No further SW needs- Patients daughter will reconnect with home health and start up his home care services again.  [employer] LCSW [phone]

## 2018-10-06 NOTE — Discharge Instructions (Addendum)
Please stay with him, we do strongly advise restarting home health, return to the emergency room for any new or worrisome symptoms.

## 2018-10-06 NOTE — ED Notes (Signed)
Spoke with daughter regarding pt coming in today. Pt was found in the same position from yesterday in a depends with stool and urine.  Family believes he is not eating or drinking like he should. Per pt he hurts "from the bottom of my feet to the top of my head" and does not like to change positions. Was seen on the 17th and family set up home health to come out but pt refused their services. Encourage family to set it up again and encouraged pt to accept for his own safety. Pt lives by himself and family comes in and checks on him.

## 2018-10-06 NOTE — ED Provider Notes (Addendum)
[facility] Emergency Department Provider Note  ____________________________________________   I have reviewed the triage vital signs and the nursing notes. Where available I have reviewed prior notes and, if possible and indicated, outside hospital notes.    HISTORY  Chief Complaint Weakness    HPI [name] is a 84 y.o. male  Presents today because he had trouble getting out of his recliner.  Did not fall.  Family states that he does not unusual for him to have these difficulties.  He does live with another person who they think is not good for his health.  He has been having some issues with getting around the house for a long time.  He has had home health arranged through this hospital but refused to let them in, and he does not want to go to assisted living facility.  There is therefore some impasse in the family about what the best thing to do for him.   they do check him regularly, and they noted this morning he had been up and about so they brought him in here.  Patient has no complaints he states he feels "fine".  He has no chest pain or shortness of breath no nausea no vomiting no abdominal pain did not fall, there is no skin breakdown his bottom, he has no dysuria no urinary frequency, he does have chronic arthritic issues and was just difficult to get out of the chair.  He is at his baseline according to family he does suffer from some degree of dementia    Past Medical History:  Diagnosis Date  . Arthritis    "hips, back" (06/17/2015)  . Asthma   . Atrial fibrillation ([place]) 01/21/2018  . Chronic chest pain   . Chronic lower back pain   . Coronary artery disease    a. s/p BMS to RCA in 2002; b. s/p cutting balloon POBA ;   c. cath 6/12: oDx 80% (treated with repeat cutting balloon POBA), mLAD 50% with 30-40% at Dx, CFX 30%, pRCA 25% with patent stents;  d.  Lex MV 4/14:  Low Risk - EF 61%, inf scar with peri-infarct ischemia  . Dyspnea    chronic   . Essential hypertension   . GERD (gastroesophageal reflux disease)    h/o esophageal spasm  . GI bleed 03/03/2014  . Headache   . History of blood transfusion    "related to OR"  . History of hiatal hernia   . Hyperlipidemia   . Hypertension   . Melanoma of lower back ([place]) late 1990's  . Memory loss   . Myocardial infarction ([place]) 2001   x 1, confirned 1 possible  . Pre-syncope 07/31/2017    Patient Active Problem List   Diagnosis Date Noted  . GI bleed 09/24/2018  . PICC (peripherally inserted central catheter) in place 07/12/2018  . Medication monitoring encounter 03/22/2018  . Malnutrition of moderate degree 01/22/2018  . Sacral decubitus ulcer, stage IV, healing  01/22/2018  . Atrial fibrillation ([place]) 01/21/2018  . Unresponsive episode 01/20/2018  . Elevated troponin 01/20/2018  . Pre-syncope 07/31/2017  . B12 deficiency 06/19/2017  . Essential hypertension   . Bladder outlet obstruction   . Generalized weakness 03/03/2014  . ESOPHAGEAL STRICTURE 09/20/2010  . ORTHOSTATIC DIZZINESS 08/30/2010  . IBS 08/26/2010  . RECTAL BLEEDING 08/26/2010  . RECTAL PAIN 08/26/2010  . ARTHRITIS 08/26/2010  . LOSS OF APPETITE 08/26/2010  . OTHER DYSPHAGIA 08/26/2010  . COLONIC POLYPS, HX OF 08/26/2010  .  PALPITATIONS, HX OF 08/18/2010  . PERIPHERAL NEUROPATHY 05/01/2009  . History of myocardial infarction 05/01/2009  . ALLERGIC RHINITIS, SEASONAL 05/01/2009  . DEPRESSION, HX OF 05/01/2009  . Personal history of other diseases of digestive system 05/01/2009  . NEPHROLITHIASIS, HX OF 05/01/2009  . BENIGN PROSTATIC HYPERTROPHY, HX OF 05/01/2009  . LAMINECTOMY, HX OF 05/01/2009  . Hyperlipidemia 10/20/2008  . CAD (coronary artery disease) 10/20/2008  . GERD 10/20/2008    Past Surgical History:  Procedure Laterality Date  . ANTERIOR CERVICAL DECOMP/DISCECTOMY FUSION    . BACK SURGERY  multiple  . CARDIAC CATHETERIZATION     "I've had 17 caths" (06/17/2015)  . CATARACT  EXTRACTION W/ INTRAOCULAR LENS  IMPLANT, BILATERAL Bilateral   . CERVICAL DISC SURGERY  multiple  . COLONOSCOPY WITH PROPOFOL N/A 04/17/2014   Procedure: COLONOSCOPY WITH PROPOFOL;  Surgeon: Winfield Cunas., MD;  Location: [facility] ENDOSCOPY;  Service: Endoscopy;  Laterality: N/A;  . CORONARY ANGIOPLASTY WITH STENT PLACEMENT  x 2 stents    previous percutaneous intervention on the  RCA and the diagonal branch  . ESOPHAGOGASTRODUODENOSCOPY  03/23/2012   Procedure: ESOPHAGOGASTRODUODENOSCOPY (EGD);  Surgeon: Winfield Cunas., MD;  Location: [facility];  Service: Endoscopy;  Laterality: N/A;  . ESOPHAGOGASTRODUODENOSCOPY N/A 03/04/2014   Procedure: ESOPHAGOGASTRODUODENOSCOPY (EGD);  Surgeon: Arta Silence, MD;  Location: [facility] ENDOSCOPY;  Service: Endoscopy;  Laterality: N/A;  . LAMINECTOMY    . LEFT HEART CATHETERIZATION WITH CORONARY ANGIOGRAM N/A 01/13/2014   Procedure: LEFT HEART CATHETERIZATION WITH CORONARY ANGIOGRAM;  Surgeon: Blane Ohara, MD;  Location: [facility] CATH LAB;  Service: Cardiovascular;  Laterality: N/A;  . MELANOMA EXCISION  late 1990's   "lower back"  . POSTERIOR LAMINECTOMY / DECOMPRESSION CERVICAL SPINE    . SAVORY DILATION  03/23/2012   Procedure: SAVORY DILATION;  Surgeon: Winfield Cunas., MD;  Location: [facility] ENDOSCOPY;  Service: Endoscopy;  Laterality: N/A;  . TONSILLECTOMY  1930's    Prior to Admission medications   Medication Sig Start Date End Date Taking? Authorizing Provider  acetaminophen (TYLENOL) 325 MG tablet Take 2 tablets (650 mg total) by mouth every 4 (four) hours as needed for headache or mild pain. Patient not taking: Reported on 09/24/2018 12/16/13   Erlene Quan, PA-C  amiodarone (PACERONE) 200 MG tablet Take 0.5 tablets (100 mg total) by mouth daily. 09/11/18   Fay Records, MD  amLODipine (NORVASC) 2.5 MG tablet Take 0.5 tablets (1.25 mg total) by mouth daily. Patient taking differently: Take 2.5 mg by mouth daily.  09/12/18   Fay Records, MD   amoxicillin-clavulanate (AUGMENTIN) 500-125 MG tablet Take 1 tablet (500 mg total) by mouth 3 (three) times daily. 09/26/18   Thurnell Lose, MD  apixaban (ELIQUIS) 5 MG TABS tablet Take 1 tablet (5 mg total) by mouth 2 (two) times daily. 09/30/18   Thurnell Lose, MD  atorvastatin (LIPITOR) 80 MG tablet Take 1 tablet (80 mg total) by mouth daily. 12/27/17 09/24/18  Richardson Dopp T, PA-C  docusate sodium (COLACE) 100 MG capsule Take 1 capsule (100 mg total) by mouth 2 (two) times daily. 09/26/18 10/26/18  Thurnell Lose, MD  doxazosin (CARDURA) 1 MG tablet TAKE 1 TABLET BY MOUTH ONCE DAILY. Patient taking differently: Take 1 mg by mouth daily.  06/01/17   Fay Records, MD  feeding supplement, ENSURE ENLIVE, (ENSURE ENLIVE) LIQD Take 237 mLs by mouth 2 (two) times daily between meals. 08/02/17   Fritzi Mandes, MD  HYDROcodone-acetaminophen (  NORCO) 7.5-325 MG tablet Take 1 tablet by mouth every 4 (four) hours as needed for moderate pain. FOR PAIN Patient not taking: Reported on 09/24/2018 01/24/18   Gladstone Lighter, MD  Melatonin 10 MG TABS Take 10 mg by mouth at bedtime.    [provider]  pantoprazole (PROTONIX) 40 MG tablet TAKE 1 TABLET BY MOUTH TWICE DAILY Patient taking differently: Take 40 mg by mouth 2 (two) times daily.  06/01/17   Fay Records, MD  SYMPROIC 0.2 MG TABS Take 0.2 mg by mouth daily. 09/11/18   [provider]  tamsulosin (FLOMAX) 0.4 MG CAPS capsule Take 0.4 mg by mouth daily.  01/13/15   [provider]  traMADol (ULTRAM) 50 MG tablet Take 1 tablet (50 mg total) by mouth 2 (two) times daily for 7 days. 10/02/18 10/09/18  Menshew, Dannielle Karvonen, PA-C  vitamin B-12 1000 MCG tablet Take 1 tablet (1,000 mcg total) by mouth daily. 06/21/17   Geradine Girt, DO    Allergies Budesonide-formoterol fumarate; Iohexol; Ivp dye [iodinated diagnostic agents]; Simvastatin; Demerol [meperidine]; Meperidine hcl; Tape; Naproxen; Pregabalin; and Sulfonamide  derivatives  Family History  Problem Relation Age of Onset  . Diabetes Father   . Heart disease Father   . Asthma Father   . Heart disease Mother        CABG hx age 82  . Colon cancer Son        hx   . Colitis Son        hx  . Crohn's disease Son   . Prostate cancer Paternal Grandfather     Social History Social History   Tobacco Use  . Smoking status: Never Smoker  . Smokeless tobacco: Never Used  Substance Use Topics  . Alcohol use: No  . Drug use: No    Review of Systems Constitutional: No fever/chills Eyes: No visual changes. ENT: No sore throat. No stiff neck no neck pain Cardiovascular: Denies chest pain. Respiratory: Denies shortness of breath. Gastrointestinal:   no vomiting.  No diarrhea.  No constipation. Genitourinary: Negative for dysuria. Musculoskeletal: Negative lower extremity swelling Skin: Negative for rash. Neurological: Negative for severe headaches, focal weakness or numbness.   ____________________________________________   PHYSICAL EXAM:  VITAL SIGNS: ED Triage Vitals  Enc Vitals Group     BP 10/06/18 1352 139/78     Pulse Rate 10/06/18 1352 85     Resp 10/06/18 1352 16     Temp 10/06/18 1352 99.3 F (37.4 C)     Temp Source 10/06/18 1352 Oral     SpO2 10/06/18 1352 96 %     Weight 10/06/18 1354 178 lb (80.7 kg)     Height 10/06/18 1354 5\' 6"  (1.676 m)     Head Circumference --      Peak Flow --      Pain Score 10/06/18 1354 0     Pain Loc --      Pain Edu? --      Excl. in [place]? --     Constitutional: Alert and oriented to name and place, unsure of the exact date which is his baseline. Well appearing and in no acute distress. Eyes: Conjunctivae are normal Head: Atraumatic HEENT: No congestion/rhinnorhea. Mucous membranes are moist.  Oropharynx non-erythematous Neck:   Nontender with no meningismus, no masses, no stridor Cardiovascular: Normal rate, regular rhythm. Grossly normal heart sounds.  Good peripheral  circulation. Respiratory: Normal respiratory effort.  No retractions. Lungs CTAB. Abdominal: Soft and  nontender. No distention. No guarding no rebound Back:  There is no focal tenderness or step off.  there is no midline tenderness there are no lesions noted. there is no CVA tenderness Musculoskeletal: No lower extremity tenderness, no upper extremity tenderness. No joint effusions, no DVT signs strong distal pulses no edema Neurologic:  Normal speech and language. No gross focal neurologic deficits are appreciated.  Skin:  Skin is warm, dry and intact. No rash noted. Psychiatric: Mood and affect are normal. Speech and behavior are normal.  ____________________________________________   LABS (all labs ordered are listed, but only abnormal results are displayed)  Labs Reviewed  BASIC METABOLIC PANEL - Abnormal; Notable for the following components:      Result Value   Sodium 133 (*)    Chloride 97 (*)    Glucose, Bld 152 (*)    All other components within normal limits  CBC - Abnormal; Notable for the following components:   WBC 12.6 (*)    RBC 3.78 (*)    Hemoglobin 10.9 (*)    HCT 33.4 (*)    All other components within normal limits  TROPONIN I  URINALYSIS, COMPLETE (UACMP) WITH MICROSCOPIC  CK  CBG MONITORING, ED    Pertinent labs  results that were available during my care of the patient were reviewed by me and considered in my medical decision making (see chart for details). ____________________________________________  EKG  I personally interpreted any EKGs ordered by me or triage Sinus rhythm rate 84 bpm no acute ST elevation or depression normal axis unremarkable EKG ____________________________________________  RADIOLOGY  Pertinent labs & imaging results that were available during my care of the patient were reviewed by me and considered in my medical decision making (see chart for details). If possible, patient and/or family made aware of any abnormal  findings.  No results found. ____________________________________________    PROCEDURES  Procedure(s) performed: None  Procedures  Critical Care performed: None  ____________________________________________   INITIAL IMPRESSION / ASSESSMENT AND PLAN / ED COURSE  Pertinent labs & imaging results that were available during my care of the patient were reviewed by me and considered in my medical decision making (see chart for details).  [name] gentleman with difficulty taking care of himself at home who refuses home health care and refuses placement.  Family are attentive and appropriate, they are suggesting that they will seek to have him deemed incompetent.  In the meantime, they will stay with him and keep him safe at home.  There is no evidence that he is being abused or neglected, and they are very appropriate.  I do not think Adult Protective Services necessarily needs to be involved, patient is I think will look out for by his family and the limitations are coming from him which they are coming to grips with.  At this time there is no evidence of significant dehydration although we will give him fluid his family is worried about this possibility.  Patient has retained kidney function blood work is negative for any acute pathology that I can detect we will send a urinalysis, he is upset about being here but otherwise in no acute distress and seems to be otherwise at his baseline medically  ----------------------------------------- 5:02 PM on 10/06/2018 -----------------------------------------  And in no acute distress at baseline family would like to take him home at this time no acute medical or identified they do not wish him admitted they would like to take him home.  Social worker did meet  with him they will restart social services, all of the necessary steps have been taken from our and to make sure that that is happening I am informed by my social work.  We will discharge with  return precautions follow-up given understood    ____________________________________________   FINAL CLINICAL IMPRESSION(S) / ED DIAGNOSES  Final diagnoses:  None      This chart was dictated using voice recognition software.  Despite best efforts to proofread,  errors can occur which can change meaning.      Schuyler Amor, MD 10/06/18 1452    Schuyler Amor, MD 10/06/18 [phone]

## 2018-10-06 NOTE — Progress Notes (Signed)
LCSW met with patient and family. He was not very receptive to any plan other than to return home. It was suggested he allow others to help so he can remain at home. He reported things gone missing ( gun collection and knife collection ( family wrote it out they have them) when patient was speaking. LCSW is going to provide resources for the family they can access as required.  For future family and patient needs Locked nursing units and SNF. They were encouraged to start with patients family doctor to have him neurologically tested.   Patient refused for this worker to speak to his daughter but agreed to resources.  LCSW provided the medicare.gov SNF handout and Home health handout and other eldercare handouts.    LCSW [phone] 

## 2018-11-20 ENCOUNTER — Encounter: Payer: Medicare Other | Attending: Physician Assistant | Admitting: Physician Assistant

## 2018-11-20 DIAGNOSIS — I252 Old myocardial infarction: Secondary | ICD-10-CM | POA: Diagnosis not present

## 2018-11-20 DIAGNOSIS — G629 Polyneuropathy, unspecified: Secondary | ICD-10-CM | POA: Insufficient documentation

## 2018-11-20 DIAGNOSIS — I10 Essential (primary) hypertension: Secondary | ICD-10-CM | POA: Diagnosis not present

## 2018-11-20 DIAGNOSIS — L89154 Pressure ulcer of sacral region, stage 4: Secondary | ICD-10-CM | POA: Insufficient documentation

## 2018-11-20 DIAGNOSIS — Z85820 Personal history of malignant melanoma of skin: Secondary | ICD-10-CM | POA: Insufficient documentation

## 2018-11-20 DIAGNOSIS — I2510 Atherosclerotic heart disease of native coronary artery without angina pectoris: Secondary | ICD-10-CM | POA: Diagnosis not present

## 2018-11-20 DIAGNOSIS — L0231 Cutaneous abscess of buttock: Secondary | ICD-10-CM | POA: Insufficient documentation

## 2018-11-20 DIAGNOSIS — F0390 Unspecified dementia without behavioral disturbance: Secondary | ICD-10-CM | POA: Insufficient documentation

## 2018-11-21 NOTE — Progress Notes (Signed)
[name], [name] ([id number]) Visit Report for 11/20/2018 Abuse/Suicide Risk Screen Details Patient Name: [name], [name]. Date of Service: 11/20/2018 2:45 PM Medical Record Number: [id number] Patient Account Number: [id number] Date of Birth/Sex: 03-20-1931 (85 y.o. M) Treating RN: Montey Hora Primary Care Trinh Sanjose: Burman Freestone Other Clinician: Referring Maninder Deboer: Burman Freestone Treating Jerian Morais/Extender: [name], HOYT Weeks in Treatment: 0 Abuse/Suicide Risk Screen Items Answer ABUSE/SUICIDE RISK SCREEN: Has anyone close to you tried to hurt or harm you recentlyo No Do you feel uncomfortable with anyone in your familyo No Has anyone forced you do things that you didnot want to doo No Do you have any thoughts of harming yourselfo No Patient displays signs or symptoms of abuse and/or neglect. No Electronic Signature(s) Signed: 11/20/2018 4:52:45 PM By: Montey Hora Entered By: Montey Hora on 11/20/2018 15:11:27 [name] ([id number]) -------------------------------------------------------------------------------- Activities of Daily Living Details Patient Name: [name], [name]. Date of Service: 11/20/2018 2:45 PM Medical Record Number: [id number] Patient Account Number: [id number] Date of Birth/Sex: 12-14-1930 (85 y.o. M) Treating RN: Montey Hora Primary Care Talor Cheema: Burman Freestone Other Clinician: Referring Jacorie Ernsberger: Burman Freestone Treating Marcelle Bebout/Extender: [name], HOYT Weeks in Treatment: 0 Activities of Daily Living Items Answer Activities of Daily Living (Please select one for each item) Drive Automobile Not Able Take Medications Completely Able Use Telephone Completely Able Care for Appearance Need Assistance Use Toilet Completely Able Bath / Shower Need Assistance Dress Self Need Assistance Feed Self Completely Able Walk Need Assistance Get In / Out Bed Completely [place] for Self Need Assistance Electronic Signature(s) Signed: 11/20/2018 4:52:45 PM By: Montey Hora Entered By: Montey Hora on 11/20/2018 15:12:10 [name] ([id number]) -------------------------------------------------------------------------------- Education Assessment Details Patient Name: [name] Date of Service: 11/20/2018 2:45 PM Medical Record Number: [id number] Patient Account Number: [id number] Date of Birth/Sex: 05-May-1931 (85 y.o. M) Treating RN: Montey Hora Primary Care Charissa Knowles: Burman Freestone Other Clinician: Referring Bohdan Macho: Burman Freestone Treating Hakeem Frazzini/Extender: [name], HOYT Weeks in Treatment: 0 Primary Learner Assessed: Patient Learning Preferences/Education Level/Primary Language Learning Preference: Explanation, Demonstration Highest Education Level: College or Above Preferred Language: English Cognitive Barrier Assessment/Beliefs Language Barrier: No Translator Needed: No Memory Deficit: No Emotional Barrier: No Cultural/Religious Beliefs Affecting Medical Care: No Physical Barrier Assessment Impaired Vision: No Impaired Hearing: No Decreased Hand dexterity: No Knowledge/Comprehension Assessment Knowledge Level: Medium Comprehension Level: Medium Ability to understand written Medium instructions: Ability to understand verbal Medium instructions: Motivation Assessment Anxiety Level: Calm Cooperation: Cooperative Education Importance: Acknowledges Need Interest in Health Problems: Asks Questions Perception: Coherent Willingness to Engage in Self- Medium Management Activities: Readiness to Engage in Self- Medium Management Activities: Electronic Signature(s) Signed: 11/20/2018 4:52:45 PM By: Montey Hora Entered By: Montey Hora on 11/20/2018 15:12:32 [name] ([id number]) -------------------------------------------------------------------------------- Fall Risk Assessment  Details Patient Name: [name] Date of Service: 11/20/2018 2:45 PM Medical Record Number: [id number] Patient Account Number: [id number] Date of Birth/Sex: November 28, 1930 (85 y.o. M) Treating RN: Montey Hora Primary Care Alfie Alderfer: Burman Freestone Other Clinician: Referring Charly Holcomb: Burman Freestone Treating Sonda Coppens/Extender: [name], HOYT Weeks in Treatment: 0 Fall Risk Assessment Items Have you had 2 or more falls in the last 12 monthso 0 No Have you had any fall that resulted in injury in the last 12 monthso 0 No FALL RISK ASSESSMENT: History of falling - immediate or within 3 months 0 No Secondary diagnosis 0 No Ambulatory aid None/bed rest/wheelchair/nurse 0 No Crutches/cane/walker 15 Yes Furniture  0 No IV Access/Saline Lock 0 No Gait/Training Normal/bed rest/immobile 0 No Weak 10 Yes Impaired 20 Yes Mental Status Oriented to own ability 0 Yes Electronic Signature(s) Signed: 11/20/2018 4:52:45 PM By: Montey Hora Entered By: Montey Hora on 11/20/2018 15:13:10 [name] ([id number]) -------------------------------------------------------------------------------- Foot Assessment Details Patient Name: [name] Date of Service: 11/20/2018 2:45 PM Medical Record Number: [id number] Patient Account Number: [id number] Date of Birth/Sex: [birth year] (85 y.o. M) Treating RN: Montey Hora Primary Care Kalaysia Demonbreun: Burman Freestone Other Clinician: Referring Kollins Fenter: Burman Freestone Treating Melena Hayes/Extender: [name], HOYT Weeks in Treatment: 0 Foot Assessment Items Site Locations + = Sensation present, - = Sensation absent, C = Callus, U = Ulcer R = Redness, W = Warmth, M = Maceration, PU = Pre-ulcerative lesion F = Fissure, S = Swelling, D = Dryness Assessment Right: Left: Other Deformity: No No Prior Foot Ulcer: No No Prior Amputation: No No Charcot Joint: No No Ambulatory Status: Ambulatory With Help Assistance Device: Cane Gait:  Administrator, arts) Signed: 11/20/2018 4:52:45 PM By: Montey Hora Entered By: Montey Hora on 11/20/2018 15:13:29 [name] ([id number]) -------------------------------------------------------------------------------- Nutrition Risk Assessment Details Patient Name: [name] Date of Service: 11/20/2018 2:45 PM Medical Record Number: [id number] Patient Account Number: [id number] Date of Birth/Sex: 1930-11-07 (85 y.o. M) Treating RN: Montey Hora Primary Care Lindsey Hommel: Burman Freestone Other Clinician: Referring Shani Fitch: Burman Freestone Treating Nicha Hemann/Extender: [name], HOYT Weeks in Treatment: 0 Height (in): 69 Weight (lbs): 170.6 Body Mass Index (BMI): 25.2 Nutrition Risk Assessment Items NUTRITION RISK SCREEN: I have an illness or condition that made me change the kind and/or amount of 0 No food I eat I eat fewer than two meals per day 0 No I eat few fruits and vegetables, or milk products 0 No I have three or more drinks of beer, liquor or wine almost every day 0 No I have tooth or mouth problems that make it hard for me to eat 0 No I don't always have enough money to buy the food I need 0 No I eat alone most of the time 0 No I take three or more different prescribed or over-the-counter drugs a day 1 Yes Without wanting to, I have lost or gained 10 pounds in the last six months 0 No I am not always physically able to shop, cook and/or feed myself 0 No Nutrition Protocols Good Risk Protocol 0 No interventions needed Moderate Risk Protocol Electronic Signature(s) Signed: 11/20/2018 4:52:45 PM By: Montey Hora Entered By: Montey Hora on 11/20/2018 15:13:17

## 2018-11-21 NOTE — Progress Notes (Signed)
[name], [name] ([id number]) Visit Report for 11/20/2018 Chief Complaint Document Details Patient Name: [name], [name]. Date of Service: 11/20/2018 2:45 PM Medical Record Number: [id number] Patient Account Number: [id number] Date of Birth/Sex: [birth year] (85 y.o. M) Treating RN: Montey Hora Primary Care Provider: Burman Freestone Other Clinician: Referring Provider: Burman Freestone Treating Provider/Extender: [name], HOYT Weeks in Treatment: 0 Information Obtained from: Patient Chief Complaint Sacral pressure ulcer Electronic Signature(s) Signed: 11/20/2018 5:55:30 PM By: Worthy Keeler PA-C Entered By: Worthy Keeler on 11/20/2018 15:30:38 [name] ([id number]) -------------------------------------------------------------------------------- Debridement Details Patient Name: [name] Date of Service: 11/20/2018 2:45 PM Medical Record Number: [id number] Patient Account Number: [id number] Date of Birth/Sex: 12-04-1930 (85 y.o. M) Treating RN: Montey Hora Primary Care Provider: Burman Freestone Other Clinician: Referring Provider: Burman Freestone Treating Provider/Extender: [name], HOYT Weeks in Treatment: 0 Debridement Performed for Wound #2 Midline Sacrum Assessment: Performed By: Physician STONE III, HOYT E., PA-C Debridement Type: Chemical/Enzymatic/Mechanical Agent Used: normal saline Level of Consciousness (Pre- Awake and Alert procedure): Pre-procedure Verification/Time Yes - 15:20 Out Taken: Start Time: 15:20 Pain Control: Lidocaine 4% Topical Solution Instrument: Other : gauze Bleeding: None End Time: 15:22 Procedural Pain: 0 Post Procedural Pain: 0 Response to Treatment: Procedure was tolerated well Level of Consciousness Awake and Alert (Post-procedure): Post Debridement Measurements of Total Wound Length: (cm) 0.1 Width: (cm) 0.1 Depth: (cm) 2.5 Volume: (cm) 0.02 Character of Wound/Ulcer Post Debridement: Improved Post  Procedure Diagnosis Same as Pre-procedure Electronic Signature(s) Signed: 11/20/2018 4:30:14 PM By: Montey Hora Signed: 11/20/2018 5:55:30 PM By: Worthy Keeler PA-C Entered By: Montey Hora on 11/20/2018 16:30:14 [name] ([id number]) -------------------------------------------------------------------------------- HPI Details Patient Name: [name] Date of Service: 11/20/2018 2:45 PM Medical Record Number: [id number] Patient Account Number: [id number] Date of Birth/Sex: 1930/12/03 (85 y.o. M) Treating RN: Montey Hora Primary Care Provider: Burman Freestone Other Clinician: Referring Provider: Burman Freestone Treating Provider/Extender: [name], HOYT Weeks in Treatment: 0 History of Present Illness HPI Description: 02/06/18 on evaluation today patient presents for initial evaluation and our clinic concerning an issue which began roughly 3 weeks ago when the patient fell in his home on the floor in his kitchen and laid him down this detergent for roughly 3 days. He had a pressure injury to the left shoulder. This unfortunately has caused him a lot of discomfort although it finally seems to be doing better if anything is really having a lot of itching right now. This appears potentially be a contact dermatitis issue. He also has a significant pressure injury to the sacrum at this time as well which is also showing fascia exposure right over the bone but no evidence of bone exposure at this point which is good news. They have been using Santyl as well as Saline soaked gauze at this point in time. There does appear to be a lot of necrotic slough in the base of the wound. He does have a history of incontinence, myocardial infarction, and hypertension. He also is "borderline diabetic" hemoglobin A1c of 6.0. Currently he has some discomfort in the pressure site at the sacrum but fortunately nothing too significant this did require sharp debridement today. 02/13/18 on  evaluation today patient appears to be doing much better in regard to his sacral wound. He has been tolerating the dressing changes without complication with the Vashe. Fortunately there is no evidence of infection and though there is some Slough on the surface of the wound bed he has excellent granulation noted.  Overall I'm pleased with how things have progressed in that regard. A glance at his shoulder as well and the rash seems to be someone improving in my pinion at this site as well. Overall I am pleased with what we're seeing and so is the family. 02/20/18 on evaluation today patient appears to be doing a little worse in regard to the sacral wound only in the fact that there is redness surrounding it has me somewhat concerned for infection. The drainage has also apparently been a little bit off color compared to normal according to family they did keep the dressing today that was removed to show me and I agree this seems to be a little bit different compared to what we have been seeing. Coupled with the redness I'm concerned he may be developing some cellulitis surrounding the wound bed. 02/27/18 on evaluation today patient presents for follow-up concerning his sacral ulcer. We have received the results back from his wound culture which shows unfortunately that the doxycycline will not be of benefit for him I am going to need to initiate treatment with something else in order to treat the pseudomonas. Otherwise he does not seem to be having any significant pain although his daughter states there are sometimes when he states having pain. We continue to use the Vashe currently. 03/06/18 on evaluation today patient's sacral wound appears to be doing better in my opinion. He has been tolerating the dressing changes without complication. With that being said the silver nitrate has helped with the prominent area of hyper granulation at the 6 o'clock location we will likely need to repeat this again  today. Nonetheless overall I am pleased with how things have improved over the last week. The erythema surrounding the wound seems to be greatly improved. 03/13/18 on evaluation today patient's wound actually does not appear to be terribly infected although she does continue to have erythema surrounding the wound bed especially on the left border. I'm still somewhat concerned about the fact that the oral antibiotics alone may not be completely treating his infection. I previously discussed may need to go for IV antibiotic therapy I'm concerned that may be the case. We will need to make a referral today for infectious disease. 03/20/18 on evaluation today the patient sacral wound actually appears to be doing fairly well in regard to granulation although he continues unfortunately to have it your theme is surrounding the periwound region. There's also some increased swelling at the 6 o'clock location which also has me somewhat concerned. With that being said he does have some discomfort but nothing too significant at this point. He still has not heard from infectious disease his daughter and wife are both present during the office visit today they're going to check back with this again. We did get the information for them to call them today. 03/27/18 on evaluation today patient is seen concerning his ongoing sacral ulcer. He has been tolerating the dressing changes without complication. With that being said he does present with evidence of bright green drainage noted on the dressing which again is something that I do often expect to see with a pseudomonas infection. He continues to have your theme is OBRIEN, HUSKINS ([id number]) surrounding the wound bed as well and again I'm not 100% convinced this is just pressure related. I did speak with Colletta Maryland who is the nurse practitioner in [place] with infectious disease. I spoke with her actually yesterday concerning this patient. She is not 100% convinced  that this  is infected. She question whether the wound may just be colonized with Pseudomonas and not actually causing an active infection. I am really not thinking that the edema is associated with pressure alone and again overall I don't feel that your theme and is consistent with a pressure injury either as he's never had any contusions noted like a deep tissue injury on his heel which was new and I did visualize today this was on the left heel. Nonetheless she wanted to give this a little bit more time and thought it would be appropriate to start the Wound VAC at this point. 04/03/18 on evaluation today patient sacral ulcer actually appears to be doing fairly well at this point. He has been tolerating the dressing changes without complication. With that being said I'm very pleased with the progress that has been made in regard to his sacral wound over the past week I do not see as much in the way of erythema which is great news. Nonetheless he does have a small area of hyper granulation unfortunately. This is at roughly the 7 o'clock location and I think does need to be addressed so that this will heal more appropriately. Nonetheless I think we may be ready to go ahead and initiate therapy with the Wound VAC. 04/10/18 on evaluation today patient appears to be doing excellent in regard to his sacral ulcer. The show signs of great improvement in overall I'm very pleased with how things look. He has been tolerating the dressing changes without complication. Specifically this is the Wound VAC. He also seems to be doing well with the antibiotic there is decreased your theme and redness surrounding the sacral area at this point in the wound has filled in quite significantly. 04/17/18 on evaluation today patient actually appears to be doing excellent in regard to his sacral ulcer. He's been tolerating the dressing changes without complication specifically the Wound VAC. There really are no major concerns  from the patient nor family this point he is having no pain. He does have a little bit of Epiboly on the lateral portions of the wound where he does have a little bit more depth that will need to be addressed today. 04/23/18 on evaluation today patient's wound actually appears to be doing excellent at this point. He has been tolerating the Wound VAC and this appears to be doing well other than the fact that it seems to be breaking seal at the 6 o'clock location. I do believe that adding a duodenum dressing at this location try and help maintain the seal would be appropriate and likely very effective. With that being said he overall seems to be showing signs of good improvement at this point. There does not appear to be any evidence of significant infection which is also excellent news. 04/30/18 on evaluation today patient actually appears to be doing well in regard to his sacral ulcer. He's been tolerating the dressing changes without complication. Fortunately there does not appear to be any evidence of infection. Overall I'm very pleased with the progress that has been made up to this point. He does have some blistering underneath the draping unfortunately although this is definitely something that has been noted on other patients previously is a fairly common occurrence. Nonetheless the patient seems to be doing fairly well in general in my pinion based on what I see at this time. I do believe these are fairly superficial and minor. 05/07/18 on evaluation today patient's wound actually appears to be doing excellent  at this point in time. He has been tolerating the Wound VAC decently well he states that it is somewhat cumbersome to carry around unfortunately. The only other issue he's been having according to family is that they been having a difficult time keeping the Wound VAC in place and doing what is supposed to do without making. Obviously I do think that this is definitely of concern.  Nonetheless I do believe she's made good progress up to this point. I'm very happy in that regard. 05/14/18 on evaluation today patient's wound continues to make good progress at this point. He had a minimal amount of slough noted on the surface which was easily wiped away with saline and gauze and in general I feel like that he is continuing to show excellent progress even with the discontinuation of the Wound VAC. Overall I'm pleased in this regard. He was having issues with the Wound VAC in getting it to seal I think that using the Prisma at this time has been equally efficient and getting the wound to diminish in size. 05/29/18- He is here in follow up evaluation for a sacral ulcer. There is improvement, we will continue with prisma and he will follow up in two weeks 06/11/18 on evaluation today patient had unfortunately bright green drainage on the dressing upon evaluation today. This is something that we have encountered before although we were able to get things under control previously with antibiotics. With that being said currently upon further inspection of the three areas of hyper granulation that were separate from the actual wound itself which was almost healed it really appears that these all have some depth to them. They are more tunnels that really have not closed or at least have reopened as a result likely of infection in my pinion. This is definitely not what I was expecting or hoping for. MELO, STAUBER ([id number]) 06/26/18 on evaluation today patient continues to experience issues with what appears to be small abscesses in the sacral region unfortunately. With that being said he has been tolerating the dressing changes without complication which is good news. He's not having any significant discomfort which is also good news. With that being said his daughter states that after he left last week that the packing that we have placed fell out quite rapidly and he subsequently  healed over very quick to the point they were not able to even repack the regions. Nonetheless there appear to be several fluctuance areas noted at this point there's one central region that does seem to be draining still discharge that is somewhat green in color. I did review the results of the wound culture which did show evidence of infection with both MRSA as well as pseudomonas based on that result. Nonetheless again with his other current medications we are not able to do the Cipro due to issues with potential long QT syndrome. I am going to give him a prescription for doxycycline in order to help with the potential MRSA infection. 07/09/18 on evaluation today patient actually appears to be doing about the same in regard to his sacral wound. He actually has his MRI scheduled for tomorrow and then subsequently is going to be having his infectious disease appointment for Thursday of this week. Fortunately he's not having any significant discomfort he still has your theme in the sacral region he still has several blister/flux went areas although they technically are not blisters this is more like a underlying abscess. The one area that is open  still does probe down to bone. Again I am concerned about a deeper infection possibly even sacral osteomyelitis. 07/23/18 on evaluation today patient actually appears to be doing very well all things considered in regard to his sacral ulcer. Since I've last seen him he actually did have his MRI performed which showed that he has a complex fluid collection superficial to the distal sacrum measuring 5.9 x 4.4 x 2.8 cm which abuts the posterior aspect of the distal sacrum and the sacrum itself shows cortical destruction consistent with osteomyelitis. She has also been seen by infectious disease and currently orders have been initiated for IV antibiotic therapy for the next eight weeks. He was seen by Janene Madeira NP and placed on Ceftazidime and Daptomycin.  Currently he has not really been on this quite long enough to see a sufficient response to the new orders as far as antibiotic therapy is concerned. Nonetheless it does appear that he is likely on the right track at this point which is great news. Nonetheless the question which both Colletta Maryland and myself have discussed both with the patient and between ourselves is whether or not the patient may need to be seen by surgeon for surgical evacuation of the fluid collection/abscess and possible debridement of the sacrum itself. Nonetheless at this time my personal opinion is probably gonna be that we wait and get this at least a couple weeks to see the response that he receives with the IV antibiotic therapy. 08/06/18 on evaluation today patient actually appears to be doing rather well in regard to his sacral ulcer region all things considered. He has been tolerating the dressing changes without complication. With that being said there is not any obvious opening nor any drainage noted at this point in time upon evaluation. The patient has been tolerating the dressing changes though. He's doing well with the IV antibiotic therapy. 08/20/18 on evaluation today patient appears to be doing wonderful in regard to his sacral region. In fact there appears to be no wound opening or drainage at this time. Overall I'm very pleased with how things have progressed. The patient likewise as well as his wife and daughter are very happy as well. Readmission: 11/20/18 on evaluation today patient presents for reevaluation our clinic concerning issues with his sacral region. Unfortunately the area in question is the same region which we previously treated what we thought would successfully back in November 2019. At that time the patient underwent IV antibiotic therapy which seemed to do the job very well. Subsequently however in the past couple of weeks he has begun to have drainage and bleeding from the sacral region and is  having increased pain yet again. Fortunately there is no signs of systemic infection although it does appear that the complex abscess that was previously noted may not have fully cleared there was a question between myself as well is infectious disease previous whether not he needed to see a surgeon being that things got better the family opted not to see a surgeon at that time. Nonetheless I am concerned that he may the need to see a surgeon in order to have this area surgically debrided and possibly a bone culture obtained in order to get a better idea of what we're treating and ensure that this is able to completely and fully heal. Electronic Signature(s) Signed: 11/20/2018 5:55:30 PM By: Worthy Keeler PA-C Entered By: Worthy Keeler on 11/20/2018 17:37:30 [name] ([id number]) -------------------------------------------------------------------------------- Physical Exam Details Patient Name: Allene Dillon  J. Date of Service: 11/20/2018 2:45 PM Medical Record Number: [id number] Patient Account Number: [id number] Date of Birth/Sex: [birth year] (85 y.o. M) Treating RN: Montey Hora Primary Care Provider: Burman Freestone Other Clinician: Referring Provider: Burman Freestone Treating Provider/Extender: STONE III, HOYT Weeks in Treatment: 0 Constitutional sitting or standing blood pressure is within target range for patient.. pulse regular and within target range for patient.Marland Kitchen respirations regular, non-labored and within target range for patient.Marland Kitchen temperature within target range for patient.. Well- nourished and well-hydrated in no acute distress. Eyes conjunctiva clear no eyelid edema noted. pupils equal round and reactive to light and accommodation. Ears, Nose, Mouth, and Throat no gross abnormality of ear auricles or external auditory canals. normal hearing noted during conversation. mucus membranes moist. Respiratory normal breathing without difficulty. clear to auscultation  bilaterally. Cardiovascular regular rate and rhythm with normal S1, S2. no clubbing, cyanosis, significant edema, <3 sec cap refill. Gastrointestinal (GI) soft, non-tender, non-distended, +BS. no ventral hernia noted. Musculoskeletal Patient unable to walk without assistance. no significant deformity or arthritic changes, no loss or range of motion, no clubbing. Psychiatric this patient is able to make decisions and demonstrates good insight into disease process. Alert and Oriented x 3. pleasant and cooperative. Notes Upon inspection today patient's wound bed actually has a significant region of hyper granulation which is somewhat unusual in characteristic and appearance. Subsequently this area required probing around to find the actual opening once it was found it was noted that he actually does have an opening that goes all the way down to the sacral bone which can be felt on probing. Nonetheless this is exactly what was going on previous when he had this issue and went on the IV antibiotics. Nonetheless it doesn't feel that this has completely resolved unfortunately. Electronic Signature(s) Signed: 11/20/2018 5:55:30 PM By: Worthy Keeler PA-C Entered By: Worthy Keeler on 11/20/2018 17:38:26 Yunis, Voorheis [name] ([id number]) -------------------------------------------------------------------------------- Physician Orders Details Patient Name: VA, BROADWELL Date of Service: 11/20/2018 2:45 PM Medical Record Number: [id number] Patient Account Number: [id number] Date of Birth/Sex: [birth year] (85 y.o. M) Treating RN: Montey Hora Primary Care Provider: Burman Freestone Other Clinician: Referring Provider: Burman Freestone Treating Provider/Extender: [name], HOYT Weeks in Treatment: 0 Verbal / Phone Orders: No Diagnosis Coding ICD-10 Coding Code Description L98.8 Other specified disorders of the skin and subcutaneous tissue L02.31 Cutaneous abscess of buttock Wound  Cleansing Wound #2 Midline Sacrum o Clean wound with Normal Saline. o May Shower, gently pat wound dry prior to applying new dressing. Anesthetic (add to Medication List) Wound #2 Midline Sacrum o Topical Lidocaine 4% cream applied to wound bed prior to debridement (In Clinic Only). Primary Wound Dressing Wound #2 Midline Sacrum o Silver Alginate Secondary Dressing Wound #2 Midline Sacrum o Boardered Foam Dressing Dressing Change Frequency Wound #2 Midline Sacrum o Change dressing every day. Follow-up Appointments Wound #2 Midline Sacrum o Return Appointment in 1 week. Consults o General Surgery Patient Medications Allergies: Lyrica, gabapentin, Sulfa (Sulfonamide Antibiotics), Iodinated Contrast- Oral and IV Dye, budesonide, iohexol, simvastatin, Demerol, meperidine, adhesive tape, naproxen, pregabalin Notifications Medication Indication Start End doxycycline hyclate 11/20/2018 DOSE 1 - oral 100 mg capsule - 1 capsule oral taken 2 times a day for 14 days KEPLER, MCCABE ([id number]) Electronic Signature(s) Signed: 11/20/2018 5:45:34 PM By: Worthy Keeler PA-C Previous Signature: 11/20/2018 4:52:45 PM Version By: Montey Hora Entered By: Worthy Keeler on 11/20/2018 17:45:34 [name] ([id number]) -------------------------------------------------------------------------------- Problem List Details Patient Name: [name] Date  of Service: 11/20/2018 2:45 PM Medical Record Number: [id number] Patient Account Number: [id number] Date of Birth/Sex: 03-14-1931 (85 y.o. M) Treating RN: Montey Hora Primary Care Provider: Burman Freestone Other Clinician: Referring Provider: Burman Freestone Treating Provider/Extender: [name], HOYT Weeks in Treatment: 0 Active Problems ICD-10 Evaluated Encounter Code Description Active Date Today Diagnosis L89.154 Pressure ulcer of sacral region, stage 4 11/20/2018 No Yes L02.31 Cutaneous abscess of buttock  11/20/2018 No Yes Inactive Problems Resolved Problems Electronic Signature(s) Signed: 11/20/2018 5:55:30 PM By: Worthy Keeler PA-C Entered By: Worthy Keeler on 11/20/2018 17:36:04 [name] ([id number]) -------------------------------------------------------------------------------- Progress Note Details Patient Name: [name] Date of Service: 11/20/2018 2:45 PM Medical Record Number: [id number] Patient Account Number: [id number] Date of Birth/Sex: 1931-06-29 (85 y.o. M) Treating RN: Montey Hora Primary Care Provider: Burman Freestone Other Clinician: Referring Provider: Burman Freestone Treating Provider/Extender: [name], HOYT Weeks in Treatment: 0 Subjective Chief Complaint Information obtained from Patient Sacral pressure ulcer History of Present Illness (HPI) 02/06/18 on evaluation today patient presents for initial evaluation and our clinic concerning an issue which began roughly 3 weeks ago when the patient fell in his home on the floor in his kitchen and laid him down this detergent for roughly 3 days. He had a pressure injury to the left shoulder. This unfortunately has caused him a lot of discomfort although it finally seems to be doing better if anything is really having a lot of itching right now. This appears potentially be a contact dermatitis issue. He also has a significant pressure injury to the sacrum at this time as well which is also showing fascia exposure right over the bone but no evidence of bone exposure at this point which is good news. They have been using Santyl as well as Saline soaked gauze at this point in time. There does appear to be a lot of necrotic slough in the base of the wound. He does have a history of incontinence, myocardial infarction, and hypertension. He also is "borderline diabetic" hemoglobin A1c of 6.0. Currently he has some discomfort in the pressure site at the sacrum but fortunately nothing too significant this did  require sharp debridement today. 02/13/18 on evaluation today patient appears to be doing much better in regard to his sacral wound. He has been tolerating the dressing changes without complication with the Vashe. Fortunately there is no evidence of infection and though there is some Slough on the surface of the wound bed he has excellent granulation noted. Overall I'm pleased with how things have progressed in that regard. A glance at his shoulder as well and the rash seems to be someone improving in my pinion at this site as well. Overall I am pleased with what we're seeing and so is the family. 02/20/18 on evaluation today patient appears to be doing a little worse in regard to the sacral wound only in the fact that there is redness surrounding it has me somewhat concerned for infection. The drainage has also apparently been a little bit off color compared to normal according to family they did keep the dressing today that was removed to show me and I agree this seems to be a little bit different compared to what we have been seeing. Coupled with the redness I'm concerned he may be developing some cellulitis surrounding the wound bed. 02/27/18 on evaluation today patient presents for follow-up concerning his sacral ulcer. We have received the results back from his wound culture which shows unfortunately that the doxycycline  will not be of benefit for him I am going to need to initiate treatment with something else in order to treat the pseudomonas. Otherwise he does not seem to be having any significant pain although his daughter states there are sometimes when he states having pain. We continue to use the Vashe currently. 03/06/18 on evaluation today patient's sacral wound appears to be doing better in my opinion. He has been tolerating the dressing changes without complication. With that being said the silver nitrate has helped with the prominent area of hyper granulation at the 6 o'clock location  we will likely need to repeat this again today. Nonetheless overall I am pleased with how things have improved over the last week. The erythema surrounding the wound seems to be greatly improved. 03/13/18 on evaluation today patient's wound actually does not appear to be terribly infected although she does continue to have erythema surrounding the wound bed especially on the left border. I'm still somewhat concerned about the fact that the oral antibiotics alone may not be completely treating his infection. I previously discussed may need to go for IV antibiotic therapy I'm concerned that may be the case. We will need to make a referral today for infectious disease. 03/20/18 on evaluation today the patient sacral wound actually appears to be doing fairly well in regard to granulation although he continues unfortunately to have it your theme is surrounding the periwound region. There's also some increased swelling at the 6 o'clock location which also has me somewhat concerned. With that being said he does have some discomfort but DAVI, KROON. ([id number]) nothing too significant at this point. He still has not heard from infectious disease his daughter and wife are both present during the office visit today they're going to check back with this again. We did get the information for them to call them today. 03/27/18 on evaluation today patient is seen concerning his ongoing sacral ulcer. He has been tolerating the dressing changes without complication. With that being said he does present with evidence of bright green drainage noted on the dressing which again is something that I do often expect to see with a pseudomonas infection. He continues to have your theme is surrounding the wound bed as well and again I'm not 100% convinced this is just pressure related. I did speak with Colletta Maryland who is the nurse practitioner in [place] with infectious disease. I spoke with her actually yesterday concerning  this patient. She is not 100% convinced that this is infected. She question whether the wound may just be colonized with Pseudomonas and not actually causing an active infection. I am really not thinking that the edema is associated with pressure alone and again overall I don't feel that your theme and is consistent with a pressure injury either as he's never had any contusions noted like a deep tissue injury on his heel which was new and I did visualize today this was on the left heel. Nonetheless she wanted to give this a little bit more time and thought it would be appropriate to start the Wound VAC at this point. 04/03/18 on evaluation today patient sacral ulcer actually appears to be doing fairly well at this point. He has been tolerating the dressing changes without complication. With that being said I'm very pleased with the progress that has been made in regard to his sacral wound over the past week I do not see as much in the way of erythema which is great news. Nonetheless he does  have a small area of hyper granulation unfortunately. This is at roughly the 7 o'clock location and I think does need to be addressed so that this will heal more appropriately. Nonetheless I think we may be ready to go ahead and initiate therapy with the Wound VAC. 04/10/18 on evaluation today patient appears to be doing excellent in regard to his sacral ulcer. The show signs of great improvement in overall I'm very pleased with how things look. He has been tolerating the dressing changes without complication. Specifically this is the Wound VAC. He also seems to be doing well with the antibiotic there is decreased your theme and redness surrounding the sacral area at this point in the wound has filled in quite significantly. 04/17/18 on evaluation today patient actually appears to be doing excellent in regard to his sacral ulcer. He's been tolerating the dressing changes without complication specifically the Wound  VAC. There really are no major concerns from the patient nor family this point he is having no pain. He does have a little bit of Epiboly on the lateral portions of the wound where he does have a little bit more depth that will need to be addressed today. 04/23/18 on evaluation today patient's wound actually appears to be doing excellent at this point. He has been tolerating the Wound VAC and this appears to be doing well other than the fact that it seems to be breaking seal at the 6 o'clock location. I do believe that adding a duodenum dressing at this location try and help maintain the seal would be appropriate and likely very effective. With that being said he overall seems to be showing signs of good improvement at this point. There does not appear to be any evidence of significant infection which is also excellent news. 04/30/18 on evaluation today patient actually appears to be doing well in regard to his sacral ulcer. He's been tolerating the dressing changes without complication. Fortunately there does not appear to be any evidence of infection. Overall I'm very pleased with the progress that has been made up to this point. He does have some blistering underneath the draping unfortunately although this is definitely something that has been noted on other patients previously is a fairly common occurrence. Nonetheless the patient seems to be doing fairly well in general in my pinion based on what I see at this time. I do believe these are fairly superficial and minor. 05/07/18 on evaluation today patient's wound actually appears to be doing excellent at this point in time. He has been tolerating the Wound VAC decently well he states that it is somewhat cumbersome to carry around unfortunately. The only other issue he's been having according to family is that they been having a difficult time keeping the Wound VAC in place and doing what is supposed to do without making. Obviously I do think that  this is definitely of concern. Nonetheless I do believe she's made good progress up to this point. I'm very happy in that regard. 05/14/18 on evaluation today patient's wound continues to make good progress at this point. He had a minimal amount of slough noted on the surface which was easily wiped away with saline and gauze and in general I feel like that he is continuing to show excellent progress even with the discontinuation of the Wound VAC. Overall I'm pleased in this regard. He was having issues with the Wound VAC in getting it to seal I think that using the Prisma at this time has  been equally efficient and getting the wound to diminish in size. 05/29/18- He is here in follow up evaluation for a sacral ulcer. There is improvement, we will continue with prisma and he will follow up in two weeks MUSA, REWERTS ([id number]) 06/11/18 on evaluation today patient had unfortunately bright green drainage on the dressing upon evaluation today. This is something that we have encountered before although we were able to get things under control previously with antibiotics. With that being said currently upon further inspection of the three areas of hyper granulation that were separate from the actual wound itself which was almost healed it really appears that these all have some depth to them. They are more tunnels that really have not closed or at least have reopened as a result likely of infection in my pinion. This is definitely not what I was expecting or hoping for. 06/26/18 on evaluation today patient continues to experience issues with what appears to be small abscesses in the sacral region unfortunately. With that being said he has been tolerating the dressing changes without complication which is good news. He's not having any significant discomfort which is also good news. With that being said his daughter states that after he left last week that the packing that we have placed fell out quite  rapidly and he subsequently healed over very quick to the point they were not able to even repack the regions. Nonetheless there appear to be several fluctuance areas noted at this point there's one central region that does seem to be draining still discharge that is somewhat green in color. I did review the results of the wound culture which did show evidence of infection with both MRSA as well as pseudomonas based on that result. Nonetheless again with his other current medications we are not able to do the Cipro due to issues with potential long QT syndrome. I am going to give him a prescription for doxycycline in order to help with the potential MRSA infection. 07/09/18 on evaluation today patient actually appears to be doing about the same in regard to his sacral wound. He actually has his MRI scheduled for tomorrow and then subsequently is going to be having his infectious disease appointment for Thursday of this week. Fortunately he's not having any significant discomfort he still has your theme in the sacral region he still has several blister/flux went areas although they technically are not blisters this is more like a underlying abscess. The one area that is open still does probe down to bone. Again I am concerned about a deeper infection possibly even sacral osteomyelitis. 07/23/18 on evaluation today patient actually appears to be doing very well all things considered in regard to his sacral ulcer. Since I've last seen him he actually did have his MRI performed which showed that he has a complex fluid collection superficial to the distal sacrum measuring 5.9 x 4.4 x 2.8 cm which abuts the posterior aspect of the distal sacrum and the sacrum itself shows cortical destruction consistent with osteomyelitis. She has also been seen by infectious disease and currently orders have been initiated for IV antibiotic therapy for the next eight weeks. He was seen by Janene Madeira NP and placed on  Ceftazidime and Daptomycin. Currently he has not really been on this quite long enough to see a sufficient response to the new orders as far as antibiotic therapy is concerned. Nonetheless it does appear that he is likely on the right track at this point which is  great news. Nonetheless the question which both Colletta Maryland and myself have discussed both with the patient and between ourselves is whether or not the patient may need to be seen by surgeon for surgical evacuation of the fluid collection/abscess and possible debridement of the sacrum itself. Nonetheless at this time my personal opinion is probably gonna be that we wait and get this at least a couple weeks to see the response that he receives with the IV antibiotic therapy. 08/06/18 on evaluation today patient actually appears to be doing rather well in regard to his sacral ulcer region all things considered. He has been tolerating the dressing changes without complication. With that being said there is not any obvious opening nor any drainage noted at this point in time upon evaluation. The patient has been tolerating the dressing changes though. He's doing well with the IV antibiotic therapy. 08/20/18 on evaluation today patient appears to be doing wonderful in regard to his sacral region. In fact there appears to be no wound opening or drainage at this time. Overall I'm very pleased with how things have progressed. The patient likewise as well as his wife and daughter are very happy as well. Readmission: 11/20/18 on evaluation today patient presents for reevaluation our clinic concerning issues with his sacral region. Unfortunately the area in question is the same region which we previously treated what we thought would successfully back in November 2019. At that time the patient underwent IV antibiotic therapy which seemed to do the job very well. Subsequently however in the past couple of weeks he has begun to have drainage and bleeding  from the sacral region and is having increased pain yet again. Fortunately there is no signs of systemic infection although it does appear that the complex abscess that was previously noted may not have fully cleared there was a question between myself as well is infectious disease previous whether not he needed to see a surgeon being that things got better the family opted not to see a surgeon at that time. Nonetheless I am concerned that he may the need to see a surgeon in order to have this area surgically debrided and possibly a bone culture obtained in order to get a better idea of what we're treating and ensure that this is able to completely and fully heal. CAYDYN, SPRUNG. ([id number]) Wound History Patient presents with 1 open wound that has been present for approximately 2 or 3 weeks. Patient has been treating wound in the following manner: bordered foam dressing. The wound has been healed in the past but has re-opened. Laboratory tests have not been performed in the last month. Patient reportedly has tested positive for an antibiotic resistant organism. Patient reportedly has not tested positive for osteomyelitis. Patient reportedly has not had testing performed to evaluate circulation in the legs. Patient History Information obtained from Patient. Allergies Lyrica, gabapentin, Sulfa (Sulfonamide Antibiotics), Iodinated Contrast- Oral and IV Dye, budesonide, iohexol, simvastatin, Demerol, meperidine, adhesive tape, naproxen, pregabalin Family History Cancer - Paternal Grandparents, Diabetes - Father, Heart Disease - Mother,Father, Stroke - Father, No family history of Hypertension, Kidney Disease, Lung Disease, Seizures, Thyroid Problems, Tuberculosis. Social History Never smoker, Marital Status - Widowed, Alcohol Use - Never, Drug Use - No History, Caffeine Use - Daily. Medical History Eyes Patient has history of Cataracts - bilateral removal Denies history of Glaucoma, Optic  Neuritis Ear/Nose/Mouth/Throat Denies history of Chronic sinus problems/congestion, Middle ear problems Hematologic/Lymphatic Denies history of Anemia, Hemophilia, Human Immunodeficiency Virus, Lymphedema, Sickle Cell  Disease Respiratory Patient has history of Asthma Denies history of Aspiration, Chronic Obstructive Pulmonary Disease (COPD), Pneumothorax, Sleep Apnea, Tuberculosis Cardiovascular Patient has history of Angina, Arrhythmia, Coronary Artery Disease, Hypertension, Myocardial Infarction - 2001 Denies history of Congestive Heart Failure, Deep Vein Thrombosis, Hypotension, Peripheral Arterial Disease, Peripheral Venous Disease, Phlebitis, Vasculitis Gastrointestinal Denies history of Cirrhosis , Colitis, Crohn s, Hepatitis A, Hepatitis B, Hepatitis C Endocrine Denies history of Type I Diabetes, Type II Diabetes Genitourinary Denies history of End Stage Renal Disease Immunological Denies history of Lupus Erythematosus, Raynaud s, Scleroderma Integumentary (Skin) Denies history of History of Burn, History of pressure wounds Musculoskeletal Patient has history of Osteoarthritis Denies history of Gout, Rheumatoid Arthritis, Osteomyelitis Neurologic Patient has history of Dementia, Neuropathy Denies history of Quadriplegia, Paraplegia, Seizure Disorder Oncologic Denies history of Received Chemotherapy, Received Radiation Psychiatric Denies history of Anorexia/bulimia, Confinement Anxiety Hospitalization/Surgery History - 01/20/2018, ARMS, Fall. TAMAJ, JURGENS ([id number]) Medical And Surgical History Notes Endocrine Borderline Oncologic Melanoma on back Review of Systems (ROS) Eyes Denies complaints or symptoms of Dry Eyes, Vision Changes, Glasses / Contacts. Ear/Nose/Mouth/Throat Denies complaints or symptoms of Difficult clearing ears, Sinusitis. Hematologic/Lymphatic Denies complaints or symptoms of Bleeding / Clotting Disorders, Human Immunodeficiency  Virus. Respiratory Denies complaints or symptoms of Chronic or frequent coughs, Shortness of Breath. Cardiovascular Denies complaints or symptoms of Chest pain, LE edema. Gastrointestinal Denies complaints or symptoms of Frequent diarrhea, Nausea, Vomiting. Endocrine Denies complaints or symptoms of Hepatitis, Thyroid disease, Polydypsia (Excessive Thirst). Genitourinary Denies complaints or symptoms of Kidney failure/ Dialysis, Incontinence/dribbling. Immunological Denies complaints or symptoms of Hives, Itching. Integumentary (Skin) Complains or has symptoms of Wounds. Denies complaints or symptoms of Bleeding or bruising tendency, Breakdown, Swelling. Musculoskeletal Denies complaints or symptoms of Muscle Pain, Muscle Weakness. Neurologic Denies complaints or symptoms of Numbness/parasthesias, Focal/Weakness. Psychiatric Denies complaints or symptoms of Anxiety, Claustrophobia. Objective Constitutional sitting or standing blood pressure is within target range for patient.. pulse regular and within target range for patient.Marland Kitchen respirations regular, non-labored and within target range for patient.Marland Kitchen temperature within target range for patient.. Well- nourished and well-hydrated in no acute distress. Vitals Time Taken: 3:03 PM, Height: 69 in, Source: Stated, Weight: 170.6 lbs, Source: Measured, BMI: 25.2, Temperature: 98.1 F, Pulse: 65 bpm, Respiratory Rate: 18 breaths/min, Blood Pressure: 129/66 mmHg. Eyes conjunctiva clear no eyelid edema noted. pupils equal round and reactive to light and accommodation. Ears, Nose, Mouth, and Throat no gross abnormality of ear auricles or external auditory canals. normal hearing noted during conversation. mucus membranes moist. CLAUDIE, RATHBONE. ([id number]) Respiratory normal breathing without difficulty. clear to auscultation bilaterally. Cardiovascular regular rate and rhythm with normal S1, S2. no clubbing, cyanosis, significant  edema, Gastrointestinal (GI) soft, non-tender, non-distended, +BS. no ventral hernia noted. Musculoskeletal Patient unable to walk without assistance. no significant deformity or arthritic changes, no loss or range of motion, no clubbing. Psychiatric this patient is able to make decisions and demonstrates good insight into disease process. Alert and Oriented x 3. pleasant and cooperative. General Notes: Upon inspection today patient's wound bed actually has a significant region of hyper granulation which is somewhat unusual in characteristic and appearance. Subsequently this area required probing around to find the actual opening once it was found it was noted that he actually does have an opening that goes all the way down to the sacral bone which can be felt on probing. Nonetheless this is exactly what was going on previous when he had this issue and went on the IV antibiotics.  Nonetheless it doesn't feel that this has completely resolved unfortunately. Integumentary (Hair, Skin) Wound #2 status is Open. Original cause of wound was Bump. The wound is located on the Midline Sacrum. The wound measures 0.1cm length x 0.1cm width x 2.5cm depth; 0.008cm^2 area and 0.02cm^3 volume. There is Fat Layer (Subcutaneous Tissue) Exposed exposed. There is no undermining noted, however, there is tunneling at 1:00 with a maximum distance of 2.4cm. There is a medium amount of serous drainage noted. The wound margin is indistinct and nonvisible. There is medium (34-66%) red granulation within the wound bed. There is no necrotic tissue within the wound bed. The periwound skin appearance exhibited: Erythema. The periwound skin appearance did not exhibit: Callus, Crepitus, Excoriation, Induration, Rash, Scarring, Dry/Scaly, Maceration, Atrophie Blanche, Cyanosis, Ecchymosis, Hemosiderin Staining, Mottled, Pallor, Rubor. The surrounding wound skin color is noted with erythema. Periwound temperature was noted as  No Abnormality. Assessment Active Problems ICD-10 Pressure ulcer of sacral region, stage 4 Cutaneous abscess of buttock Procedures Wound #2 Pre-procedure diagnosis of Wound #2 is an Atypical located on the Midline Sacrum . There was a Chemical/Enzymatic/Mechanical debridement performed by STONE III, HOYT E., PA-C. With the following instrument(s): gauze after achieving pain control using Lidocaine 4% Topical Solution. Other agent used was normal saline. A time out was conducted at 15:20, prior to the start of the procedure. There was no bleeding. The procedure was tolerated well with a pain JAMAREA, SELNER. ([id number]) level of 0 throughout and a pain level of 0 following the procedure. Post Debridement Measurements: 0.1cm length x 0.1cm width x 2.5cm depth; 0.02cm^3 volume. Character of Wound/Ulcer Post Debridement is improved. Post procedure Diagnosis Wound #2: Same as Pre-Procedure Plan Wound Cleansing: Wound #2 Midline Sacrum: Clean wound with Normal Saline. May Shower, gently pat wound dry prior to applying new dressing. Anesthetic (add to Medication List): Wound #2 Midline Sacrum: Topical Lidocaine 4% cream applied to wound bed prior to debridement (In Clinic Only). Primary Wound Dressing: Wound #2 Midline Sacrum: Silver Alginate Secondary Dressing: Wound #2 Midline Sacrum: Boardered Foam Dressing Dressing Change Frequency: Wound #2 Midline Sacrum: Change dressing every day. Follow-up Appointments: Wound #2 Midline Sacrum: Return Appointment in 1 week. Consults ordered were: General Surgery The following medication(s) was prescribed: doxycycline hyclate oral 100 mg capsule 1 1 capsule oral taken 2 times a day for 14 days starting 11/20/2018 My suggestion at this point to the patient and family were that we get a surgical consult to discuss options for possible surgical debridement in order to clear away the likely remaining abscess in this area. Again he previously was  successfully treated with IV antibiotics but I'm fearful that there was still sinus track extending from the sacral area specifically the bone that has now reopened and that the infection was not truly and completely cleared. Nonetheless this may require surgical intervention and therefore I am gonna referred the patient to a surgeon for their opinion. I recommended Dr. Lysle Pearl and the families in agreement with seeing him. Depending on what he has to say the patient may need to see infectious disease as well. Subsequently I will make an appointment to see him back for reevaluation in one weeks time. If anything changes or worsens in the meantime he will contact the office and let me know. Please see above for specific wound care orders. We will see patient for re-evaluation in 1 week(s) here in the clinic. If anything worsens or changes patient will contact our office for additional recommendations. Electronic  Signature(s) Signed: 11/20/2018 5:55:30 PM By: Worthy Keeler PA-C Entered By: Worthy Keeler on 11/20/2018 17:45:52 Kwabena, Strutz [name] ([id number], GAUTREAU ([id number]) -------------------------------------------------------------------------------- ROS/PFSH Details Patient Name: IAM, LIPSON Date of Service: 11/20/2018 2:45 PM Medical Record Number: [id number] Patient Account Number: [id number] Date of Birth/Sex: 03/15/1931 (85 y.o. M) Treating RN: Montey Hora Primary Care Provider: Burman Freestone Other Clinician: Referring Provider: Burman Freestone Treating Provider/Extender: [name], HOYT Weeks in Treatment: 0 Information Obtained From Patient Wound History Do you currently have one or more open woundso Yes How many open wounds do you currently haveo 1 Approximately how long have you had your woundso 2 or 3 weeks How have you been treating your wound(s) until nowo bordered foam dressing Has your wound(s) ever healed and then re-openedo Yes Have you had any  lab work done in the past montho No Have you tested positive for an antibiotic resistant organism (MRSA, VRE)o Yes Have you tested positive for osteomyelitis (bone infection)o No Have you had any tests for circulation on your legso No Eyes Complaints and Symptoms: Negative for: Dry Eyes; Vision Changes; Glasses / Contacts Medical History: Positive for: Cataracts - bilateral removal Negative for: Glaucoma; Optic Neuritis Ear/Nose/Mouth/Throat Complaints and Symptoms: Negative for: Difficult clearing ears; Sinusitis Medical History: Negative for: Chronic sinus problems/congestion; Middle ear problems Hematologic/Lymphatic Complaints and Symptoms: Negative for: Bleeding / Clotting Disorders; Human Immunodeficiency Virus Medical History: Negative for: Anemia; Hemophilia; Human Immunodeficiency Virus; Lymphedema; Sickle Cell Disease Respiratory Complaints and Symptoms: Negative for: Chronic or frequent coughs; Shortness of Breath Medical History: Positive for: Asthma Negative for: Aspiration; Chronic Obstructive Pulmonary Disease (COPD); Pneumothorax; Sleep Apnea; Tuberculosis Cardiovascular RAYDON, CHAPPUIS ([id number]) Complaints and Symptoms: Negative for: Chest pain; LE edema Medical History: Positive for: Angina; Arrhythmia; Coronary Artery Disease; Hypertension; Myocardial Infarction - 2001 Negative for: Congestive Heart Failure; Deep Vein Thrombosis; Hypotension; Peripheral Arterial Disease; Peripheral Venous Disease; Phlebitis; Vasculitis Gastrointestinal Complaints and Symptoms: Negative for: Frequent diarrhea; Nausea; Vomiting Medical History: Negative for: Cirrhosis ; Colitis; Crohnos; Hepatitis A; Hepatitis B; Hepatitis C Endocrine Complaints and Symptoms: Negative for: Hepatitis; Thyroid disease; Polydypsia (Excessive Thirst) Medical History: Negative for: Type I Diabetes; Type II Diabetes Past Medical History Notes: Borderline Genitourinary Complaints and  Symptoms: Negative for: Kidney failure/ Dialysis; Incontinence/dribbling Medical History: Negative for: End Stage Renal Disease Immunological Complaints and Symptoms: Negative for: Hives; Itching Medical History: Negative for: Lupus Erythematosus; Raynaudos; Scleroderma Integumentary (Skin) Complaints and Symptoms: Positive for: Wounds Negative for: Bleeding or bruising tendency; Breakdown; Swelling Medical History: Negative for: History of Burn; History of pressure wounds Musculoskeletal Complaints and Symptoms: Negative for: Muscle Pain; Muscle Weakness Medical History: Positive for: Osteoarthritis Negative for: Gout; Rheumatoid Arthritis; Osteomyelitis Vejar, [name] ([id number]) Neurologic Complaints and Symptoms: Negative for: Numbness/parasthesias; Focal/Weakness Medical History: Positive for: Dementia; Neuropathy Negative for: Quadriplegia; Paraplegia; Seizure Disorder Psychiatric Complaints and Symptoms: Negative for: Anxiety; Claustrophobia Medical History: Negative for: Anorexia/bulimia; Confinement Anxiety Oncologic Medical History: Negative for: Received Chemotherapy; Received Radiation Past Medical History Notes: Melanoma on back HBO Extended History Items Eyes: Cataracts Immunizations Pneumococcal Vaccine: Received Pneumococcal Vaccination: Yes Implantable Devices Hospitalization / Surgery History Name of Hospital Purpose of Hospitalization/[place] Date [facility] Fall 01/20/2018 Family and Social History Cancer: Yes - Paternal Grandparents; Diabetes: Yes - Father; Heart Disease: Yes - Mother,Father; Hypertension: No; Kidney Disease: No; Lung Disease: No; Seizures: No; Stroke: Yes - Father; Thyroid Problems: No; Tuberculosis: No; Never smoker; Marital Status - Widowed; Alcohol Use: Never; Drug Use: No History; Caffeine Use: Daily;  Financial Concerns: No; Food, Clothing or Shelter Needs: No; Support System Lacking: No; Transportation Concerns: No; Advanced  Directives: Yes (Not Provided); Patient does not want information on Advanced Directives; Do not resuscitate: Yes (Not Provided); Living Will: Yes (Not Provided); Medical Power of Attorney: Yes - Deavion Dobbs (Not Provided) Electronic Signature(s) Signed: 11/20/2018 4:52:45 PM By: Montey Hora Signed: 11/20/2018 5:55:30 PM By: Worthy Keeler PA-C Entered By: Montey Hora on 11/20/2018 15:11:18 [name] ([id number]) -------------------------------------------------------------------------------- SuperBill Details Patient Name: [name] Date of Service: 11/20/2018 Medical Record Number: [id number] Patient Account Number: [id number] Date of Birth/Sex: Jun 11, 1931 (85 y.o. M) Treating RN: Montey Hora Primary Care Provider: Burman Freestone Other Clinician: Referring Provider: Burman Freestone Treating Provider/Extender: [name], HOYT Weeks in Treatment: 0 Diagnosis Coding ICD-10 Codes Code Description L89.154 Pressure ulcer of sacral region, stage 4 L02.31 Cutaneous abscess of buttock Facility Procedures CPT4 Code: [id number] Description: 99214 - WOUND CARE VISIT-LEV 4 EST PT Modifier: Quantity: 1 Physician Procedures CPT4 Code: 4742595 Description: 63875 - WC PHYS LEVEL 4 - EST PT ICD-10 Diagnosis Description L89.154 Pressure ulcer of sacral region, stage 4 L02.31 Cutaneous abscess of buttock Modifier: Quantity: 1 Electronic Signature(s) Signed: 11/20/2018 5:55:30 PM By: Worthy Keeler PA-C Previous Signature: 11/20/2018 4:30:25 PM Version By: Montey Hora Entered By: Worthy Keeler on 11/20/2018 17:40:22

## 2018-11-21 NOTE — Progress Notes (Signed)
SHRIYANS, KUENZI ([id number]) Visit Report for 11/20/2018 Allergy List Details Patient Name: [name], [name]. Date of Service: 11/20/2018 2:45 PM Medical Record Number: [id number] Patient Account Number: [id number] Date of Birth/Sex: Dec 19, 1930 (84 y.o. M) Treating RN: Montey Hora Primary Care Rivers Hamrick: Burman Freestone Other Clinician: Referring Torren Maffeo: Burman Freestone Treating Daron Breeding/Extender: [name], HOYT Weeks in Treatment: 0 Allergies Active Allergies Lyrica gabapentin Sulfa (Sulfonamide Antibiotics) Iodinated Contrast- Oral and IV Dye budesonide iohexol simvastatin Demerol meperidine adhesive tape naproxen pregabalin Allergy Notes Electronic Signature(s) Signed: 11/20/2018 4:52:45 PM By: Montey Hora Entered By: Montey Hora on 11/20/2018 15:08:44 [name] ([id number]) -------------------------------------------------------------------------------- Arrival Information Details Patient Name: [name] Date of Service: 11/20/2018 2:45 PM Medical Record Number: [id number] Patient Account Number: [id number] Date of Birth/Sex: 1930/10/27 (85 y.o. M) Treating RN: Montey Hora Primary Care Merica Prell: Burman Freestone Other Clinician: Referring Arlynn Stare: Burman Freestone Treating Ina Scrivens/Extender: [name], HOYT Weeks in Treatment: 0 Visit Information Patient Arrived: Ambulatory Arrival Time: 15:02 Accompanied By: wife and daughter Transfer Assistance: None Patient Identification Verified: Yes Secondary Verification Process Yes Completed: Patient Has Alerts: Yes Patient Alerts: Patient on Blood Thinner Eliquis History Since Last Visit Any new allergies or adverse reactions: No Had a fall or experienced change in activities of daily living that may affect risk of falls: No Signs or symptoms of abuse/neglect since last visito No Hospitalized since last visit: No Implantable device outside of the clinic excluding cellular tissue based  products placed in the center since last visit: No Has Dressing in Place as Prescribed: Yes Electronic Signature(s) Signed: 11/20/2018 4:52:45 PM By: Montey Hora Entered By: Montey Hora on 11/20/2018 15:13:52 [name] ([id number]) -------------------------------------------------------------------------------- Clinic Level of Care Assessment Details Patient Name: [name] Date of Service: 11/20/2018 2:45 PM Medical Record Number: [id number] Patient Account Number: [id number] Date of Birth/Sex: August 27, 1931 (85 y.o. M) Treating RN: Montey Hora Primary Care Jahnasia Tatum: Burman Freestone Other Clinician: Referring Dannel Rafter: Burman Freestone Treating Jahmia Berrett/Extender: [name], HOYT Weeks in Treatment: 0 Clinic Level of Care Assessment Items TOOL 2 Quantity Score []  - Use when only an EandM is performed on the INITIAL visit 0 ASSESSMENTS - Nursing Assessment / Reassessment X - General Physical Exam (combine w/ comprehensive assessment (listed just below) when 1 20 performed on new pt. evals) X- 1 25 Comprehensive Assessment (HX, ROS, Risk Assessments, Wounds Hx, etc.) ASSESSMENTS - Wound and Skin Assessment / Reassessment X - Simple Wound Assessment / Reassessment - one wound 1 5 []  - 0 Complex Wound Assessment / Reassessment - multiple wounds []  - 0 Dermatologic / Skin Assessment (not related to wound area) ASSESSMENTS - Ostomy and/or Continence Assessment and Care []  - Incontinence Assessment and Management 0 []  - 0 Ostomy Care Assessment and Management (repouching, etc.) PROCESS - Coordination of Care X - Simple Patient / Family Education for ongoing care 1 15 []  - 0 Complex (extensive) Patient / Family Education for ongoing care X- 1 10 Staff obtains Programmer, systems, Records, Test Results / Process Orders []  - 0 Staff telephones HHA, Nursing Homes / Clarify orders / etc []  - 0 Routine Transfer to another Facility (non-emergent condition) []  - 0 Routine  Hospital Admission (non-emergent condition) []  - 0 New Admissions / Biomedical engineer / Ordering NPWT, Apligraf, etc. []  - 0 Emergency Hospital Admission (emergent condition) X- 1 10 Simple Discharge Coordination []  - 0 Complex (extensive) Discharge Coordination PROCESS - Special Needs []  - Pediatric / Minor Patient Management 0 []  - 0 Isolation Patient Management Dunaway, Ollen Gross  J. ([id number]) []  - 0 Hearing / Language / Visual special needs []  - 0 Assessment of Community assistance (transportation, D/C planning, etc.) []  - 0 Additional assistance / Altered mentation X- 1 15 Support Surface(s) Assessment (bed, cushion, seat, etc.) INTERVENTIONS - Wound Cleansing / Measurement X - Wound Imaging (photographs - any number of wounds) 1 5 []  - 0 Wound Tracing (instead of photographs) X- 1 5 Simple Wound Measurement - one wound []  - 0 Complex Wound Measurement - multiple wounds X- 1 5 Simple Wound Cleansing - one wound []  - 0 Complex Wound Cleansing - multiple wounds INTERVENTIONS - Wound Dressings X - Small Wound Dressing one or multiple wounds 1 10 []  - 0 Medium Wound Dressing one or multiple wounds []  - 0 Large Wound Dressing one or multiple wounds []  - 0 Application of Medications - injection INTERVENTIONS - Miscellaneous []  - External ear exam 0 []  - 0 Specimen Collection (cultures, biopsies, blood, body fluids, etc.) []  - 0 Specimen(s) / Culture(s) sent or taken to Lab for analysis []  - 0 Patient Transfer (multiple staff / Civil Service fast streamer / Similar devices) []  - 0 Simple Staple / Suture removal (25 or less) []  - 0 Complex Staple / Suture removal (26 or more) []  - 0 Hypo / Hyperglycemic Management (close monitor of Blood Glucose) []  - 0 Ankle / Brachial Index (ABI) - do not check if billed separately Has the patient been seen at the hospital within the last three years: Yes Total Score: 125 Level Of Care: New/Established - Level 4 Electronic  Signature(s) Signed: 11/20/2018 4:52:45 PM By: Montey Hora Entered By: Montey Hora on 11/20/2018 15:39:55 [name] ([id number]) -------------------------------------------------------------------------------- Encounter Discharge Information Details Patient Name: [name] Date of Service: 11/20/2018 2:45 PM Medical Record Number: [id number] Patient Account Number: [id number] Date of Birth/Sex: [birth year] (85 y.o. M) Treating RN: Montey Hora Primary Care Milagros Middendorf: Burman Freestone Other Clinician: Referring Mardie Kellen: Burman Freestone Treating Einer Meals/Extender: [name], HOYT Weeks in Treatment: 0 Encounter Discharge Information Items Discharge Condition: Stable Ambulatory Status: Cane Discharge Destination: Home Transportation: Private Auto Accompanied By: daughter Schedule Follow-up Appointment: Yes Clinical Summary of Care: Electronic Signature(s) Signed: 11/20/2018 4:52:45 PM By: Montey Hora Entered By: Montey Hora on 11/20/2018 15:43:30 [name] ([id number]) -------------------------------------------------------------------------------- Lower Extremity Assessment Details Patient Name: [name] Date of Service: 11/20/2018 2:45 PM Medical Record Number: [id number] Patient Account Number: [id number] Date of Birth/Sex: 11/13/1930 (85 y.o. M) Treating RN: Montey Hora Primary Care Adryen Cookson: Burman Freestone Other Clinician: Referring Kaylynn Chamblin: Burman Freestone Treating Kyna Blahnik/Extender: [name], HOYT Weeks in Treatment: 0 Electronic Signature(s) Signed: 11/20/2018 4:52:45 PM By: Montey Hora Entered By: Montey Hora on 11/20/2018 15:08:30 [name] ([id number]) -------------------------------------------------------------------------------- Multi Wound Chart Details Patient Name: [name] Date of Service: 11/20/2018 2:45 PM Medical Record Number: [id number] Patient Account Number: [id number] Date of Birth/Sex:  1931/04/03 (85 y.o. M) Treating RN: Montey Hora Primary Care Ande Therrell: Burman Freestone Other Clinician: Referring Kimball Manske: Burman Freestone Treating Mutasim Tuckey/Extender: [name], HOYT Weeks in Treatment: 0 Vital Signs Height(in): 69 Pulse(bpm): 65 Weight(lbs): 170.6 Blood Pressure(mmHg): 129/66 Body Mass Index(BMI): 25 Temperature(F): 98.1 Respiratory Rate 18 (breaths/min): Photos: [2:No Photos] [N/A:N/A] Wound Location: [2:Sacrum - Midline] [N/A:N/A] Wounding Event: [2:Bump] [N/A:N/A] Primary Etiology: [2:Atypical] [N/A:N/A] Comorbid History: [2:Cataracts, Asthma, Angina, Arrhythmia, Coronary Artery Disease, Hypertension, Myocardial Infarction, Osteoarthritis, Dementia, Neuropathy] [N/A:N/A] Date Acquired: [2:10/30/2018] [N/A:N/A] Weeks of Treatment: [2:0] [N/A:N/A] Wound Status: [2:Open] [N/A:N/A] Measurements L x W x D [2:0.1x0.1x2.5] [N/A:N/A] (cm) Area (cm) : [2:0.008] [  N/A:N/A] Volume (cm) : [2:0.02] [N/A:N/A] % Reduction in Area: [2:0.00%] [N/A:N/A] % Reduction in Volume: [2:-1900.00%] [N/A:N/A] Position 1 (o'clock): [2:1] Maximum Distance 1 (cm): [2:2.4] Tunneling: [2:Yes] [N/A:N/A] Classification: [2:Full Thickness Without Exposed Support Structures] [N/A:N/A] Exudate Amount: [2:Medium] [N/A:N/A] Exudate Type: [2:Serous] [N/A:N/A] Exudate Color: [2:amber] [N/A:N/A] Wound Margin: [2:Indistinct, nonvisible] [N/A:N/A] Granulation Amount: [2:Medium (34-66%)] [N/A:N/A] Granulation Quality: [2:Red] [N/A:N/A] Necrotic Amount: [2:None Present (0%)] [N/A:N/A] Exposed Structures: [2:Fat Layer (Subcutaneous Tissue) Exposed: Yes Fascia: No Tendon: No Muscle: No] [N/A:N/A] Joint: No Bone: No Epithelialization: Medium (34-66%) N/A N/A Periwound Skin Texture: Excoriation: No N/A N/A Induration: No Callus: No Crepitus: No Rash: No Scarring: No Periwound Skin Moisture: Maceration: No N/A N/A Dry/Scaly: No Periwound Skin Color: Erythema: Yes N/A N/A Atrophie  Blanche: No Cyanosis: No Ecchymosis: No Hemosiderin Staining: No Mottled: No Pallor: No Rubor: No Temperature: No Abnormality N/A N/A Tenderness on Palpation: No N/A N/A Wound Preparation: Ulcer Cleansing: N/A N/A Rinsed/Irrigated with Saline Topical Anesthetic Applied: Other: lidocaine 4% Treatment Notes Electronic Signature(s) Signed: 11/20/2018 4:52:45 PM By: Montey Hora Entered By: Montey Hora on 11/20/2018 15:36:24 [name] ([id number]) -------------------------------------------------------------------------------- Soham Details Patient Name: VERL, KITSON Date of Service: 11/20/2018 2:45 PM Medical Record Number: [id number] Patient Account Number: [id number] Date of Birth/Sex: [birth year] (85 y.o. M) Treating RN: Montey Hora Primary Care Ramonia Mcclaran: Burman Freestone Other Clinician: Referring Kattaleya Alia: Burman Freestone Treating Amyah Clawson/Extender: [name], HOYT Weeks in Treatment: 0 Active Inactive Abuse / Safety / Falls / Self Care Management Nursing Diagnoses: Potential for falls Goals: Patient will not experience any injury related to falls Date Initiated: 11/20/2018 Target Resolution Date: 02/15/2019 Goal Status: Active Interventions: Assess fall risk on admission and as needed Notes: Orientation to the Wound Care Program Nursing Diagnoses: Knowledge deficit related to the wound healing center program Goals: Patient/caregiver will verbalize understanding of the [facility] Date Initiated: 11/20/2018 Target Resolution Date: 02/15/2019 Goal Status: Active Interventions: Provide education on orientation to the wound center Notes: Wound/Skin Impairment Nursing Diagnoses: Impaired tissue integrity Goals: Ulcer/skin breakdown will heal within 14 weeks Date Initiated: 11/20/2018 Target Resolution Date: 02/15/2019 Goal Status: Active Interventions: Assess patient/caregiver ability to obtain necessary  supplies ARIES, TOWNLEY ([id number]) Assess patient/caregiver ability to perform ulcer/skin care regimen upon admission and as needed Assess ulceration(s) every visit Notes: Electronic Signature(s) Signed: 11/20/2018 4:52:45 PM By: Montey Hora Entered By: Montey Hora on 11/20/2018 15:36:10 [name] ([id number]) -------------------------------------------------------------------------------- Pain Assessment Details Patient Name: [name] Date of Service: 11/20/2018 2:45 PM Medical Record Number: [id number] Patient Account Number: [id number] Date of Birth/Sex: Oct 04, 1931 (85 y.o. M) Treating RN: Montey Hora Primary Care Greg Eckrich: Burman Freestone Other Clinician: Referring Kalik Hoare: Burman Freestone Treating Cai Anfinson/Extender: [name], HOYT Weeks in Treatment: 0 Active Problems Location of Pain Severity and Description of Pain Patient Has Paino Yes Site Locations Rate the pain. Current Pain Level: 8 Pain Management and Medication Current Pain Management: Electronic Signature(s) Signed: 11/20/2018 4:14:38 PM By: Lorine Bears RCP, RRT, CHT Signed: 11/20/2018 4:52:45 PM By: Montey Hora Entered By: Lorine Bears on 11/20/2018 15:03:15 [name] ([id number]) -------------------------------------------------------------------------------- Patient/Caregiver Education Details Patient Name: [name] Date of Service: 11/20/2018 2:45 PM Medical Record Number: [id number] Patient Account Number: [id number] Date of Birth/Gender: 23-Mar-1931 (85 y.o. M) Treating RN: Montey Hora Primary Care Physician: Burman Freestone Other Clinician: Referring Physician: Burman Freestone Treating Physician/Extender: Sharalyn Ink in Treatment: 0 Education Assessment Education Provided To: Patient and Caregiver Education Topics Provided Wound/Skin Impairment:  Handouts: Other: wound care as ordered and need for surgery  consult Methods: Demonstration, Explain/Verbal Responses: State content correctly Electronic Signature(s) Signed: 11/20/2018 4:52:45 PM By: Montey Hora Entered By: Montey Hora on 11/20/2018 15:40:38 [name] ([id number]) -------------------------------------------------------------------------------- Wound Assessment Details Patient Name: [name]. Date of Service: 11/20/2018 2:45 PM Medical Record Number: [id number] Patient Account Number: [id number] Date of Birth/Sex: 1931-06-06 (85 y.o. M) Treating RN: Montey Hora Primary Care Aariyana Manz: Burman Freestone Other Clinician: Referring Cari Burgo: Burman Freestone Treating Koni Kannan/Extender: [name], HOYT Weeks in Treatment: 0 Wound Status Wound Number: 2 Primary Atypical Etiology: Wound Location: Sacrum - Midline Wound Open Wounding Event: Bump Status: Date Acquired: 10/30/2018 Comorbid Cataracts, Asthma, Angina, Arrhythmia, Weeks Of Treatment: 0 History: Coronary Artery Disease, Hypertension, Clustered Wound: No Myocardial Infarction, Osteoarthritis, Dementia, Neuropathy Photos Photo Uploaded By: Montey Hora on 11/21/2018 08:02:54 Wound Measurements Length: (cm) 0.1 Width: (cm) 0.1 Depth: (cm) 2.5 Area: (cm) 0.008 Volume: (cm) 0.02 % Reduction in Area: 0% % Reduction in Volume: -1900% Epithelialization: Medium (34-66%) Tunneling: Yes Position (o'clock): 1 Maximum Distance: (cm) 2.4 Undermining: No Wound Description Full Thickness Without Exposed Support Foul O Classification: Structures Slough Wound Margin: Indistinct, nonvisible Exudate Medium Amount: Exudate Type: Serous Exudate Color: amber dor After Cleansing: No /Fibrino No Wound Bed Granulation Amount: Medium (34-66%) Exposed Structure Granulation Quality: Red Fascia Exposed: No Necrotic Amount: None Present (0%) Fat Layer (Subcutaneous Tissue) Exposed: Yes DECKLYN, HYDER ([id number]) Tendon Exposed: No Muscle Exposed:  No Joint Exposed: No Bone Exposed: No Periwound Skin Texture Texture Color No Abnormalities Noted: No No Abnormalities Noted: No Callus: No Atrophie Blanche: No Crepitus: No Cyanosis: No Excoriation: No Ecchymosis: No Induration: No Erythema: Yes Rash: No Hemosiderin Staining: No Scarring: No Mottled: No Pallor: No Moisture Rubor: No No Abnormalities Noted: No Dry / Scaly: No Temperature / Pain Maceration: No Temperature: No Abnormality Wound Preparation Ulcer Cleansing: Rinsed/Irrigated with Saline Topical Anesthetic Applied: Other: lidocaine 4%, Treatment Notes Wound #2 (Midline Sacrum) Notes silvercel, bordered foam dressing Electronic Signature(s) Signed: 11/20/2018 4:52:45 PM By: Montey Hora Entered By: Montey Hora on 11/20/2018 15:35:24 [name] ([id number]) -------------------------------------------------------------------------------- Vitals Details Patient Name: [name] Date of Service: 11/20/2018 2:45 PM Medical Record Number: [id number] Patient Account Number: [id number] Date of Birth/Sex: July 08, 1931 (85 y.o. M) Treating RN: Montey Hora Primary Care Inaaya Vellucci: Burman Freestone Other Clinician: Referring Thekla Colborn: Burman Freestone Treating Alexxus Sobh/Extender: [name], HOYT Weeks in Treatment: 0 Vital Signs Time Taken: 15:03 Temperature (F): 98.1 Height (in): 69 Pulse (bpm): 65 Source: Stated Respiratory Rate (breaths/min): 18 Weight (lbs): 170.6 Blood Pressure (mmHg): 129/66 Source: Measured Reference Range: 80 - 120 mg / dl Body Mass Index (BMI): 25.2 Airway Electronic Signature(s) Signed: 11/20/2018 4:14:38 PM By: Lorine Bears RCP, RRT, CHT Entered By: Lorine Bears on 11/20/2018 15:05:53

## 2018-11-26 ENCOUNTER — Ambulatory Visit: Payer: Medicare Other | Admitting: Physician Assistant

## 2018-11-27 ENCOUNTER — Telehealth: Payer: Self-pay

## 2018-11-27 ENCOUNTER — Encounter: Payer: Medicare Other | Admitting: Physician Assistant

## 2018-11-27 DIAGNOSIS — L89154 Pressure ulcer of sacral region, stage 4: Secondary | ICD-10-CM | POA: Diagnosis not present

## 2018-11-27 NOTE — Telephone Encounter (Signed)
   [facility] Pre-operative Risk Assessment    Request for surgical clearance:  1. What type of surgery is being performed? Sacral wound exploration and debridement    2. When is this surgery scheduled? TBD   3. What type of clearance is required (medical clearance vs. Pharmacy clearance to hold med vs. Both)? Pharmacy  4. Are there any medications that need to be held prior to surgery and how long?Eliquis   5. Practice name and name of physician performing surgery? [facility]/Dr. Lysle Pearl   6. What is your office phone number [phone]    7.   What is your office fax number [phone]  8.   Anesthesia type (None, local, MAC, general) ? General   [name] 11/27/2018, 11:17 AM  _________________________________________________________________   (provider comments below)

## 2018-11-27 NOTE — Telephone Encounter (Signed)
Patient takes Eliquis for afib with CHADS2VASc score of 4 (age x2, HTN, CAD). Renal function is normal. Ok to hold Eliquis for 1-2 days prior to procedure.

## 2018-11-27 NOTE — Telephone Encounter (Signed)
Clinical pharmacist to review eliquis 

## 2018-11-29 NOTE — Telephone Encounter (Signed)
Faxed over Pharm clearance to [facility] and called to confirm that fax was received. Faxed received.

## 2018-11-29 NOTE — Telephone Encounter (Signed)
See pharmacy recommendation below and forward to requesting provider

## 2018-12-03 ENCOUNTER — Encounter: Payer: Medicare Other | Admitting: Physician Assistant

## 2018-12-03 DIAGNOSIS — L89154 Pressure ulcer of sacral region, stage 4: Secondary | ICD-10-CM | POA: Diagnosis not present

## 2018-12-03 NOTE — Progress Notes (Signed)
JACKSTON, OAXACA ([id number]) Visit Report for 11/27/2018 Arrival Information Details Patient Name: KALAB, CAMPS. Date of Service: 11/27/2018 2:30 PM Medical Record Number: [id number] Patient Account Number: [id number] Date of Birth/Sex: 10/08/1931 (85 y.o. M) Treating RN: Harold Barban Primary Care Edwards Mckelvie: Burman Freestone Other Clinician: Referring Devanie Galanti: Burman Freestone Treating Davey Limas/Extender: [name], HOYT Weeks in Treatment: 1 Visit Information History Since Last Visit Added or deleted any medications: No Patient Arrived: C[place] Any new allergies or adverse reactions: No Arrival Time: 14:53 Had a fall or experienced change in No Accompanied By: family activities of daily living that may affect Transfer Assistance: None risk of falls: Patient Identification Verified: Yes Signs or symptoms of abuse/neglect since last visito No Secondary Verification Process Yes Hospitalized since last visit: No Completed: Has Dressing in Place as Prescribed: Yes Patient Has Alerts: Yes Pain Present Now: Yes Patient Alerts: Patient on Blood Thinner Eliquis Electronic Signature(s) Signed: 12/03/2018 9:36:27 AM By: Harold Barban Entered By: Harold Barban on 11/27/2018 14:53:26 [name] ([id number]) -------------------------------------------------------------------------------- Clinic Level of Care Assessment Details Patient Name: [name] Date of Service: 11/27/2018 2:30 PM Medical Record Number: [id number] Patient Account Number: [id number] Date of Birth/Sex: 1931-05-22 (85 y.o. M) Treating RN: Montey Hora Primary Care Alleen Kehm: Burman Freestone Other Clinician: Referring Jaqulyn Chancellor: Burman Freestone Treating Marabelle Cushman/Extender: [name], HOYT Weeks in Treatment: 1 Clinic Level of Care Assessment Items TOOL 4 Quantity Score []  - Use when only an EandM is performed on FOLLOW-UP visit 0 ASSESSMENTS - Nursing Assessment / Reassessment X - Reassessment  of Co-morbidities (includes updates in patient status) 1 10 X- 1 5 Reassessment of Adherence to Treatment Plan ASSESSMENTS - Wound and Skin Assessment / Reassessment X - Simple Wound Assessment / Reassessment - one wound 1 5 []  - 0 Complex Wound Assessment / Reassessment - multiple wounds []  - 0 Dermatologic / Skin Assessment (not related to wound area) ASSESSMENTS - Focused Assessment []  - Circumferential Edema Measurements - multi extremities 0 []  - 0 Nutritional Assessment / Counseling / Intervention []  - 0 Lower Extremity Assessment (monofilament, tuning fork, pulses) []  - 0 Peripheral Arterial Disease Assessment (using hand held doppler) ASSESSMENTS - Ostomy and/or Continence Assessment and Care []  - Incontinence Assessment and Management 0 []  - 0 Ostomy Care Assessment and Management (repouching, etc.) PROCESS - Coordination of Care X - Simple Patient / Family Education for ongoing care 1 15 []  - 0 Complex (extensive) Patient / Family Education for ongoing care X- 1 10 Staff obtains Programmer, systems, Records, Test Results / Process Orders []  - 0 Staff telephones HHA, Nursing Homes / Clarify orders / etc []  - 0 Routine Transfer to another Facility (non-emergent condition) []  - 0 Routine Hospital Admission (non-emergent condition) []  - 0 New Admissions / Biomedical engineer / Ordering NPWT, Apligraf, etc. []  - 0 Emergency Hospital Admission (emergent condition) X- 1 10 Simple Discharge Coordination EILAN, MCINERNY ([id number]) []  - 0 Complex (extensive) Discharge Coordination PROCESS - Special Needs []  - Pediatric / Minor Patient Management 0 []  - 0 Isolation Patient Management []  - 0 Hearing / Language / Visual special needs []  - 0 Assessment of Community assistance (transportation, D/C planning, etc.) []  - 0 Additional assistance / Altered mentation []  - 0 Support Surface(s) Assessment (bed, cushion, seat, etc.) INTERVENTIONS - Wound Cleansing /  Measurement X - Simple Wound Cleansing - one wound 1 5 []  - 0 Complex Wound Cleansing - multiple wounds X- 1 5 Wound Imaging (photographs - any number of wounds) []  -  0 Wound Tracing (instead of photographs) X- 1 5 Simple Wound Measurement - one wound []  - 0 Complex Wound Measurement - multiple wounds INTERVENTIONS - Wound Dressings X - Small Wound Dressing one or multiple wounds 1 10 []  - 0 Medium Wound Dressing one or multiple wounds []  - 0 Large Wound Dressing one or multiple wounds []  - 0 Application of Medications - topical []  - 0 Application of Medications - injection INTERVENTIONS - Miscellaneous []  - External ear exam 0 []  - 0 Specimen Collection (cultures, biopsies, blood, body fluids, etc.) []  - 0 Specimen(s) / Culture(s) sent or taken to Lab for analysis []  - 0 Patient Transfer (multiple staff / Civil Service fast streamer / Similar devices) []  - 0 Simple Staple / Suture removal (25 or less) []  - 0 Complex Staple / Suture removal (26 or more) []  - 0 Hypo / Hyperglycemic Management (close monitor of Blood Glucose) []  - 0 Ankle / Brachial Index (ABI) - do not check if billed separately X- 1 5 Vital Signs WESTEN, DININO ([id number]) Has the patient been seen at the hospital within the last three years: Yes Total Score: 85 Level Of Care: New/Established - Level 3 Electronic Signature(s) Signed: 11/27/2018 4:52:44 PM By: Montey Hora Entered By: Montey Hora on 11/27/2018 15:25:43 [name] ([id number]) -------------------------------------------------------------------------------- Encounter Discharge Information Details Patient Name: [name] Date of Service: 11/27/2018 2:30 PM Medical Record Number: [id number] Patient Account Number: [id number] Date of Birth/Sex: [birth year] (85 y.o. M) Treating RN: Army Melia Primary Care Miki Labuda: Burman Freestone Other Clinician: Referring Laurynn Mccorvey: Burman Freestone Treating Jiles Goya/Extender: [name],  HOYT Weeks in Treatment: 1 Encounter Discharge Information Items Discharge Condition: Stable Ambulatory Status: Ambulatory Discharge Destination: Home Transportation: Private Auto Accompanied By: wife and daughter Schedule Follow-up Appointment: Yes Clinical Summary of Care: Electronic Signature(s) Signed: 11/27/2018 4:35:39 PM By: Army Melia Entered By: Army Melia on 11/27/2018 15:36:50 [name] ([id number]) -------------------------------------------------------------------------------- Lower Extremity Assessment Details Patient Name: [name] Date of Service: 11/27/2018 2:30 PM Medical Record Number: [id number] Patient Account Number: [id number] Date of Birth/Sex: Apr 12, 1931 (85 y.o. M) Treating RN: Harold Barban Primary Care Ioannis Schuh: Burman Freestone Other Clinician: Referring Dannette Kinkaid: Burman Freestone Treating Fard Borunda/Extender: [name], HOYT Weeks in Treatment: 1 Electronic Signature(s) Signed: 12/03/2018 9:36:27 AM By: Harold Barban Entered By: Harold Barban on 11/27/2018 15:02:38 [name] ([id number]) -------------------------------------------------------------------------------- Multi Wound Chart Details Patient Name: [name] Date of Service: 11/27/2018 2:30 PM Medical Record Number: [id number] Patient Account Number: [id number] Date of Birth/Sex: 1930/11/07 (85 y.o. M) Treating RN: Montey Hora Primary Care Jaylon Boylen: Burman Freestone Other Clinician: Referring Danis Pembleton: Burman Freestone Treating Cletus Paris/Extender: [name], HOYT Weeks in Treatment: 1 Vital Signs Height(in): 69 Pulse(bpm): 2 Weight(lbs): 170.6 Blood Pressure(mmHg): 143/69 Body Mass Index(BMI): 25 Temperature(F): 97.6 Respiratory Rate 16 (breaths/min): Photos: [2:No Photos] [N/A:N/A] Wound Location: [2:Sacrum - Midline] [N/A:N/A] Wounding Event: [2:Bump] [N/A:N/A] Primary Etiology: [2:Atypical] [N/A:N/A] Comorbid History: [2:Cataracts,  Asthma, Angina, Arrhythmia, Coronary Artery Disease, Hypertension, Myocardial Infarction, Osteoarthritis, Dementia, Neuropathy] [N/A:N/A] Date Acquired: [2:10/30/2018] [N/A:N/A] Weeks of Treatment: [2:1] [N/A:N/A] Wound Status: [2:Open] [N/A:N/A] Measurements L x W x D [2:1x0.5x0.1] [N/A:N/A] (cm) Area (cm) : [2:0.393] [N/A:N/A] Volume (cm) : [2:0.039] [N/A:N/A] % Reduction in Area: [2:-4812.50%] [N/A:N/A] % Reduction in Volume: [2:-95.00%] [N/A:N/A] Classification: [2:Full Thickness Without Exposed Support Structures] [N/A:N/A] Exudate Amount: [2:Medium] [N/A:N/A] Exudate Type: [2:Serous] [N/A:N/A] Exudate Color: [2:amber] [N/A:N/A] Wound Margin: [2:Indistinct, nonvisible] [N/A:N/A] Granulation Amount: [2:Medium (34-66%)] [N/A:N/A] Granulation Quality: [2:Red] [N/A:N/A] Necrotic Amount: [2:None Present (0%)] [N/A:N/A] Exposed Structures: [  2:Fat Layer (Subcutaneous Tissue) Exposed: Yes Fascia: No Tendon: No Muscle: No Joint: No Bone: No] [N/A:N/A] Epithelialization: [2:Medium (34-66%)] [N/A:N/A] Periwound Skin Texture: [N/A:N/A] Excoriation: No Induration: No Callus: No Crepitus: No Rash: No Scarring: No Periwound Skin Moisture: Maceration: No N/A N/A Dry/Scaly: No Periwound Skin Color: Erythema: Yes N/A N/A Atrophie Blanche: No Cyanosis: No Ecchymosis: No Hemosiderin Staining: No Mottled: No Pallor: No Rubor: No Temperature: No Abnormality N/A N/A Tenderness on Palpation: Yes N/A N/A Wound Preparation: Ulcer Cleansing: N/A N/A Rinsed/Irrigated with Saline Topical Anesthetic Applied: Other: lidocaine 4% Treatment Notes Electronic Signature(s) Signed: 11/27/2018 4:52:44 PM By: Montey Hora Entered By: Montey Hora on 11/27/2018 15:23:16 [name] ([id number]) -------------------------------------------------------------------------------- Multi-Disciplinary Care Plan Details Patient Name: PATRIK, TURNBAUGH. Date of Service: 11/27/2018 2:30 PM Medical  Record Number: [id number] Patient Account Number: [id number] Date of Birth/Sex: 03-03-1931 (85 y.o. M) Treating RN: Montey Hora Primary Care Tenika Keeran: Burman Freestone Other Clinician: Referring Ellina Sivertsen: Burman Freestone Treating Bernise Sylvain/Extender: [name], HOYT Weeks in Treatment: 1 Active Inactive Abuse / Safety / Falls / Self Care Management Nursing Diagnoses: Potential for falls Goals: Patient will not experience any injury related to falls Date Initiated: 11/20/2018 Target Resolution Date: 02/15/2019 Goal Status: Active Interventions: Assess fall risk on admission and as needed Notes: Orientation to the Wound Care Program Nursing Diagnoses: Knowledge deficit related to the wound healing center program Goals: Patient/caregiver will verbalize understanding of the [facility] Date Initiated: 11/20/2018 Target Resolution Date: 02/15/2019 Goal Status: Active Interventions: Provide education on orientation to the wound center Notes: Wound/Skin Impairment Nursing Diagnoses: Impaired tissue integrity Goals: Ulcer/skin breakdown will heal within 14 weeks Date Initiated: 11/20/2018 Target Resolution Date: 02/15/2019 Goal Status: Active Interventions: Assess patient/caregiver ability to obtain necessary supplies YORDY, MATTON ([id number]) Assess patient/caregiver ability to perform ulcer/skin care regimen upon admission and as needed Assess ulceration(s) every visit Notes: Electronic Signature(s) Signed: 11/27/2018 4:52:44 PM By: Montey Hora Entered By: Montey Hora on 11/27/2018 15:23:08 [name] ([id number]) -------------------------------------------------------------------------------- Pain Assessment Details Patient Name: [name] Date of Service: 11/27/2018 2:30 PM Medical Record Number: [id number] Patient Account Number: [id number] Date of Birth/Sex: 04-09-1931 (85 y.o. M) Treating RN: Harold Barban Primary Care Keval Nam: Burman Freestone Other Clinician: Referring Betina Puckett: Burman Freestone Treating Cyrene Gharibian/Extender: [name], HOYT Weeks in Treatment: 1 Active Problems Location of Pain Severity and Description of Pain Patient Has Paino Yes Site Locations Pain Location: Pain in Ulcers Rate the pain. Current Pain Level: 8 Pain Management and Medication Current Pain Management: Electronic Signature(s) Signed: 12/03/2018 9:36:27 AM By: Harold Barban Entered By: Harold Barban on 11/27/2018 14:53:40 [name] ([id number]) -------------------------------------------------------------------------------- Patient/Caregiver Education Details Patient Name: [name] Date of Service: 11/27/2018 2:30 PM Medical Record Number: [id number] Patient Account Number: [id number] Date of Birth/Gender: [birth year] (85 y.o. M) Treating RN: Montey Hora Primary Care Physician: Burman Freestone Other Clinician: Referring Physician: Burman Freestone Treating Physician/Extender: Sharalyn Ink in Treatment: 1 Education Assessment Education Provided To: Patient and Caregiver Education Topics Provided Wound/Skin Impairment: Handouts: Other: wound care as ordered Methods: Demonstration, Explain/Verbal Responses: State content correctly Electronic Signature(s) Signed: 11/27/2018 4:52:44 PM By: Montey Hora Entered By: Montey Hora on 11/27/2018 15:26:18 [name] ([id number]) -------------------------------------------------------------------------------- Wound Assessment Details Patient Name: [name] Date of Service: 11/27/2018 2:30 PM Medical Record Number: [id number] Patient Account Number: [id number] Date of Birth/Sex: 24-Jul-1931 (85 y.o. M) Treating RN: Harold Barban Primary Care Rajah Lamba: Burman Freestone Other Clinician: Referring Analeigha Nauman: Burman Freestone Treating Markayla Reichart/Extender: Joaquim Lai  III, HOYT Weeks in Treatment: 1 Wound Status Wound Number: 2 Primary  Atypical Etiology: Wound Location: Sacrum - Midline Wound Open Wounding Event: Bump Status: Date Acquired: 10/30/2018 Comorbid Cataracts, Asthma, Angina, Arrhythmia, Weeks Of Treatment: 1 History: Coronary Artery Disease, Hypertension, Clustered Wound: No Myocardial Infarction, Osteoarthritis, Dementia, Neuropathy Photos Photo Uploaded By: Harold Barban on 11/27/2018 16:19:35 Wound Measurements Length: (cm) 1 Width: (cm) 0.5 Depth: (cm) 0.1 Area: (cm) 0.393 Volume: (cm) 0.039 % Reduction in Area: -4812.5% % Reduction in Volume: -95% Epithelialization: Medium (34-66%) Tunneling: No Undermining: No Wound Description Full Thickness Without Exposed Support Classification: Structures Wound Margin: Indistinct, nonvisible Exudate Medium Amount: Exudate Type: Serous Exudate Color: amber Foul Odor After Cleansing: No Slough/Fibrino No Wound Bed Granulation Amount: Medium (34-66%) Exposed Structure Granulation Quality: Red Fascia Exposed: No Necrotic Amount: None Present (0%) Fat Layer (Subcutaneous Tissue) Exposed: Yes Tendon Exposed: No Muscle Exposed: No Joint Exposed: No JOSEJULIAN, TARANGO ([id number]) Bone Exposed: No Periwound Skin Texture Texture Color No Abnormalities Noted: No No Abnormalities Noted: No Callus: No Atrophie Blanche: No Crepitus: No Cyanosis: No Excoriation: No Ecchymosis: No Induration: No Erythema: Yes Rash: No Hemosiderin Staining: No Scarring: No Mottled: No Pallor: No Moisture Rubor: No No Abnormalities Noted: No Dry / Scaly: No Temperature / Pain Maceration: No Temperature: No Abnormality Tenderness on Palpation: Yes Wound Preparation Ulcer Cleansing: Rinsed/Irrigated with Saline Topical Anesthetic Applied: Other: lidocaine 4%, Treatment Notes Wound #2 (Midline Sacrum) Notes silvercel, bordered foam dressing Electronic Signature(s) Signed: 12/03/2018 9:36:27 AM By: Harold Barban Entered By: Harold Barban on  11/27/2018 15:02:20 [name] ([id number]) -------------------------------------------------------------------------------- Vitals Details Patient Name: [name] Date of Service: 11/27/2018 2:30 PM Medical Record Number: [id number] Patient Account Number: [id number] Date of Birth/Sex: April 17, 1931 (85 y.o. M) Treating RN: Harold Barban Primary Care Muhanad Torosyan: Burman Freestone Other Clinician: Referring Melford Tullier: Burman Freestone Treating Nakyla Bracco/Extender: [name], HOYT Weeks in Treatment: 1 Vital Signs Time Taken: 14:53 Temperature (F): 97.6 Height (in): 69 Pulse (bpm): 76 Weight (lbs): 170.6 Respiratory Rate (breaths/min): 16 Body Mass Index (BMI): 25.2 Blood Pressure (mmHg): 143/69 Reference Range: 80 - 120 mg / dl Airway Electronic Signature(s) Signed: 12/03/2018 9:36:27 AM By: Harold Barban Entered By: Harold Barban on 11/27/2018 14:57:33

## 2018-12-05 ENCOUNTER — Telehealth: Payer: Self-pay | Admitting: *Deleted

## 2018-12-05 NOTE — Telephone Encounter (Signed)
   [facility] Pre-operative Risk Assessment    Request for surgical clearance:  1. What type of surgery is being performed? SACRAL WOUND EXPLORATION AND DEBRIDEMENT    2. When is this surgery scheduled? TBD   3. What type of clearance is required (medical clearance vs. Pharmacy clearance to hold med vs. Both)? BOTH  4. Are there any medications that need to be held prior to surgery and how long?ELIQUIS   5. Practice name and name of physician performing surgery? [facility]; DR. Benjamine Sprague, DO   6. What is your office phone number [phone]    7.   What is your office fax number [phone]  8.   Anesthesia type (None, local, MAC, general) ? GENERAL    [name] 12/05/2018, 3:11 PM  _________________________________________________________________   (provider comments below)

## 2018-12-05 NOTE — Telephone Encounter (Signed)
Per prior note "Patient takes Eliquis for afib with CHADS2VASc score of 4 (age x2, HTN, CAD). Renal function is normal. Ok to hold Eliquis for 1-2 days prior to procedure".   I will route this recommendation to the requesting party via Epic fax function and remove from pre-op pool.  Please call with questions.  [place], [place] 12/05/2018, 3:41 PM

## 2018-12-05 NOTE — Progress Notes (Signed)
ALYXANDER, KOLLMANN ([id number]) Visit Report for 12/03/2018 Chief Complaint Document Details Patient Name: [name], [place]. Date of Service: 12/03/2018 3:45 PM Medical Record Number: [id number] Patient Account Number: [id number] Date of Birth/Sex: 1930-11-20 (85 y.o. M) Treating RN: Harold Barban Primary Care Provider: Burman Freestone Other Clinician: Referring Provider: Burman Freestone Treating Provider/Extender: [name], HOYT Weeks in Treatment: 1 Information Obtained from: Patient Chief Complaint Sacral pressure ulcer Electronic Signature(s) Signed: 12/04/2018 12:02:18 AM By: Worthy Keeler PA-C Entered By: Worthy Keeler on 12/03/2018 16:13:26 [name] ([id number]) -------------------------------------------------------------------------------- HPI Details Patient Name: [name] Date of Service: 12/03/2018 3:45 PM Medical Record Number: [id number] Patient Account Number: [id number] Date of Birth/Sex: Mar 05, 1931 (85 y.o. M) Treating RN: Harold Barban Primary Care Provider: Burman Freestone Other Clinician: Referring Provider: Burman Freestone Treating Provider/Extender: [name], HOYT Weeks in Treatment: 1 History of Present Illness HPI Description: 02/06/18 on evaluation today patient presents for initial evaluation and our clinic concerning an issue which began roughly 3 weeks ago when the patient fell in his home on the floor in his kitchen and laid him down this detergent for roughly 3 days. He had a pressure injury to the left shoulder. This unfortunately has caused him a lot of discomfort although it finally seems to be doing better if anything is really having a lot of itching right now. This appears potentially be a contact dermatitis issue. He also has a significant pressure injury to the sacrum at this time as well which is also showing fascia exposure right over the bone but no evidence of bone exposure at this point which is good news. They have  been using Santyl as well as Saline soaked gauze at this point in time. There does appear to be a lot of necrotic slough in the base of the wound. He does have a history of incontinence, myocardial infarction, and hypertension. He also is "borderline diabetic" hemoglobin A1c of 6.0. Currently he has some discomfort in the pressure site at the sacrum but fortunately nothing too significant this did require sharp debridement today. 02/13/18 on evaluation today patient appears to be doing much better in regard to his sacral wound. He has been tolerating the dressing changes without complication with the Vashe. Fortunately there is no evidence of infection and though there is some Slough on the surface of the wound bed he has excellent granulation noted. Overall I'm pleased with how things have progressed in that regard. A glance at his shoulder as well and the rash seems to be someone improving in my pinion at this site as well. Overall I am pleased with what we're seeing and so is the family. 02/20/18 on evaluation today patient appears to be doing a little worse in regard to the sacral wound only in the fact that there is redness surrounding it has me somewhat concerned for infection. The drainage has also apparently been a little bit off color compared to normal according to family they did keep the dressing today that was removed to show me and I agree this seems to be a little bit different compared to what we have been seeing. Coupled with the redness I'm concerned he may be developing some cellulitis surrounding the wound bed. 02/27/18 on evaluation today patient presents for follow-up concerning his sacral ulcer. We have received the results back from his wound culture which shows unfortunately that the doxycycline will not be of benefit for him I am going to need to initiate treatment with something else  in order to treat the pseudomonas. Otherwise he does not seem to be having any  significant pain although his daughter states there are sometimes when he states having pain. We continue to use the Vashe currently. 03/06/18 on evaluation today patient's sacral wound appears to be doing better in my opinion. He has been tolerating the dressing changes without complication. With that being said the silver nitrate has helped with the prominent area of hyper granulation at the 6 o'clock location we will likely need to repeat this again today. Nonetheless overall I am pleased with how things have improved over the last week. The erythema surrounding the wound seems to be greatly improved. 03/13/18 on evaluation today patient's wound actually does not appear to be terribly infected although she does continue to have erythema surrounding the wound bed especially on the left border. I'm still somewhat concerned about the fact that the oral antibiotics alone may not be completely treating his infection. I previously discussed may need to go for IV antibiotic therapy I'm concerned that may be the case. We will need to make a referral today for infectious disease. 03/20/18 on evaluation today the patient sacral wound actually appears to be doing fairly well in regard to granulation although he continues unfortunately to have it your theme is surrounding the periwound region. There's also some increased swelling at the 6 o'clock location which also has me somewhat concerned. With that being said he does have some discomfort but nothing too significant at this point. He still has not heard from infectious disease his daughter and wife are both present during the office visit today they're going to check back with this again. We did get the information for them to call them today. 03/27/18 on evaluation today patient is seen concerning his ongoing sacral ulcer. He has been tolerating the dressing changes without complication. With that being said he does present with evidence of bright green  drainage noted on the dressing which again is something that I do often expect to see with a pseudomonas infection. He continues to have your theme is SHIGERU, LAMPERT ([id number]) surrounding the wound bed as well and again I'm not 100% convinced this is just pressure related. I did speak with Colletta Maryland who is the nurse practitioner in [place] with infectious disease. I spoke with her actually yesterday concerning this patient. She is not 100% convinced that this is infected. She question whether the wound may just be colonized with Pseudomonas and not actually causing an active infection. I am really not thinking that the edema is associated with pressure alone and again overall I don't feel that your theme and is consistent with a pressure injury either as he's never had any contusions noted like a deep tissue injury on his heel which was new and I did visualize today this was on the left heel. Nonetheless she wanted to give this a little bit more time and thought it would be appropriate to start the Wound VAC at this point. 04/03/18 on evaluation today patient sacral ulcer actually appears to be doing fairly well at this point. He has been tolerating the dressing changes without complication. With that being said I'm very pleased with the progress that has been made in regard to his sacral wound over the past week I do not see as much in the way of erythema which is great news. Nonetheless he does have a small area of hyper granulation unfortunately. This is at roughly the 7 o'clock location and I  think does need to be addressed so that this will heal more appropriately. Nonetheless I think we may be ready to go ahead and initiate therapy with the Wound VAC. 04/10/18 on evaluation today patient appears to be doing excellent in regard to his sacral ulcer. The show signs of great improvement in overall I'm very pleased with how things look. He has been tolerating the dressing changes  without complication. Specifically this is the Wound VAC. He also seems to be doing well with the antibiotic there is decreased your theme and redness surrounding the sacral area at this point in the wound has filled in quite significantly. 04/17/18 on evaluation today patient actually appears to be doing excellent in regard to his sacral ulcer. He's been tolerating the dressing changes without complication specifically the Wound VAC. There really are no major concerns from the patient nor family this point he is having no pain. He does have a little bit of Epiboly on the lateral portions of the wound where he does have a little bit more depth that will need to be addressed today. 04/23/18 on evaluation today patient's wound actually appears to be doing excellent at this point. He has been tolerating the Wound VAC and this appears to be doing well other than the fact that it seems to be breaking seal at the 6 o'clock location. I do believe that adding a duodenum dressing at this location try and help maintain the seal would be appropriate and likely very effective. With that being said he overall seems to be showing signs of good improvement at this point. There does not appear to be any evidence of significant infection which is also excellent news. 04/30/18 on evaluation today patient actually appears to be doing well in regard to his sacral ulcer. He's been tolerating the dressing changes without complication. Fortunately there does not appear to be any evidence of infection. Overall I'm very pleased with the progress that has been made up to this point. He does have some blistering underneath the draping unfortunately although this is definitely something that has been noted on other patients previously is a fairly common occurrence. Nonetheless the patient seems to be doing fairly well in general in my pinion based on what I see at this time. I do believe these are fairly superficial and  minor. 05/07/18 on evaluation today patient's wound actually appears to be doing excellent at this point in time. He has been tolerating the Wound VAC decently well he states that it is somewhat cumbersome to carry around unfortunately. The only other issue he's been having according to family is that they been having a difficult time keeping the Wound VAC in place and doing what is supposed to do without making. Obviously I do think that this is definitely of concern. Nonetheless I do believe she's made good progress up to this point. I'm very happy in that regard. 05/14/18 on evaluation today patient's wound continues to make good progress at this point. He had a minimal amount of slough noted on the surface which was easily wiped away with saline and gauze and in general I feel like that he is continuing to show excellent progress even with the discontinuation of the Wound VAC. Overall I'm pleased in this regard. He was having issues with the Wound VAC in getting it to seal I think that using the Prisma at this time has been equally efficient and getting the wound to diminish in size. 05/29/18- He is here in follow up  evaluation for a sacral ulcer. There is improvement, we will continue with prisma and he will follow up in two weeks 06/11/18 on evaluation today patient had unfortunately bright green drainage on the dressing upon evaluation today. This is something that we have encountered before although we were able to get things under control previously with antibiotics. With that being said currently upon further inspection of the three areas of hyper granulation that were separate from the actual wound itself which was almost healed it really appears that these all have some depth to them. They are more tunnels that really have not closed or at least have reopened as a result likely of infection in my pinion. This is definitely not what I was expecting or hoping for. PHAROAH, GOGGINS  ([id number]) 06/26/18 on evaluation today patient continues to experience issues with what appears to be small abscesses in the sacral region unfortunately. With that being said he has been tolerating the dressing changes without complication which is good news. He's not having any significant discomfort which is also good news. With that being said his daughter states that after he left last week that the packing that we have placed fell out quite rapidly and he subsequently healed over very quick to the point they were not able to even repack the regions. Nonetheless there appear to be several fluctuance areas noted at this point there's one central region that does seem to be draining still discharge that is somewhat green in color. I did review the results of the wound culture which did show evidence of infection with both MRSA as well as pseudomonas based on that result. Nonetheless again with his other current medications we are not able to do the Cipro due to issues with potential long QT syndrome. I am going to give him a prescription for doxycycline in order to help with the potential MRSA infection. 07/09/18 on evaluation today patient actually appears to be doing about the same in regard to his sacral wound. He actually has his MRI scheduled for tomorrow and then subsequently is going to be having his infectious disease appointment for Thursday of this week. Fortunately he's not having any significant discomfort he still has your theme in the sacral region he still has several blister/flux went areas although they technically are not blisters this is more like a underlying abscess. The one area that is open still does probe down to bone. Again I am concerned about a deeper infection possibly even sacral osteomyelitis. 07/23/18 on evaluation today patient actually appears to be doing very well all things considered in regard to his sacral ulcer. Since I've last seen him he actually did have  his MRI performed which showed that he has a complex fluid collection superficial to the distal sacrum measuring 5.9 x 4.4 x 2.8 cm which abuts the posterior aspect of the distal sacrum and the sacrum itself shows cortical destruction consistent with osteomyelitis. She has also been seen by infectious disease and currently orders have been initiated for IV antibiotic therapy for the next eight weeks. He was seen by Janene Madeira NP and placed on Ceftazidime and Daptomycin. Currently he has not really been on this quite long enough to see a sufficient response to the new orders as far as antibiotic therapy is concerned. Nonetheless it does appear that he is likely on the right track at this point which is great news. Nonetheless the question which both Colletta Maryland and myself have discussed both with the patient and between  ourselves is whether or not the patient may need to be seen by surgeon for surgical evacuation of the fluid collection/abscess and possible debridement of the sacrum itself. Nonetheless at this time my personal opinion is probably gonna be that we wait and get this at least a couple weeks to see the response that he receives with the IV antibiotic therapy. 08/06/18 on evaluation today patient actually appears to be doing rather well in regard to his sacral ulcer region all things considered. He has been tolerating the dressing changes without complication. With that being said there is not any obvious opening nor any drainage noted at this point in time upon evaluation. The patient has been tolerating the dressing changes though. He's doing well with the IV antibiotic therapy. 08/20/18 on evaluation today patient appears to be doing wonderful in regard to his sacral region. In fact there appears to be no wound opening or drainage at this time. Overall I'm very pleased with how things have progressed. The patient likewise as well as his wife and daughter are very happy as  well. Readmission: 11/20/18 on evaluation today patient presents for reevaluation our clinic concerning issues with his sacral region. Unfortunately the area in question is the same region which we previously treated what we thought would successfully back in November 2019. At that time the patient underwent IV antibiotic therapy which seemed to do the job very well. Subsequently however in the past couple of weeks he has begun to have drainage and bleeding from the sacral region and is having increased pain yet again. Fortunately there is no signs of systemic infection although it does appear that the complex abscess that was previously noted may not have fully cleared there was a question between myself as well is infectious disease previous whether not he needed to see a surgeon being that things got better the family opted not to see a surgeon at that time. Nonetheless I am concerned that he may the need to see a surgeon in order to have this area surgically debrided and possibly a bone culture obtained in order to get a better idea of what we're treating and ensure that this is able to completely and fully heal. 11/27/18 on evaluation today patient appears to be doing about the same in regard to his sacral ulcer. He did see Dr. Lysle Pearl yesterday and the plan is to proceed forward with surgery to clean out the region. This seems to be the most appropriate goal of treatment at this point. Dr. Lysle Pearl just needed to speak with Dr. Ellene Route the patient's neurologist in order to get clearance as the patient was supposed to be scheduled for a carpal tunnel and elbow surgery with Dr. Ellene Route. Nonetheless he has apparently given clearance to proceed with this surgery for the sacral region which is deemed more important at this point. Unfortunately the patient had a little bit of increased pain although he is having redness at this point there does not appear to be any spreading infection which is good news. No  fevers, chills, nausea, or vomiting noted at this time. LYAL, HUSTED ([id number]) 12/03/18 on evaluation today patient actually appears to be doing about the same in regard to the sacral ulcer. He still waiting to hear back from Dr. Ines Bloomer office in regard to scheduling his surgery. Fortunately there's no signs of systemic infection at this time which is good news. Unfortunately he really does not seem to making any progress the doxycycline does seem to at least  be beneficial in helping to hold off the infection from worsening although unfortunately he still has a lot going on in this regard. Patient's daughter states she's actually gonna contact her office tomorrow to see what exactly is going on. Electronic Signature(s) Signed: 12/04/2018 12:02:18 AM By: Worthy Keeler PA-C Entered By: Worthy Keeler on 12/03/2018 17:18:20 [name] ([id number]) -------------------------------------------------------------------------------- Physical Exam Details Patient Name: OTIS, PORTAL Date of Service: 12/03/2018 3:45 PM Medical Record Number: [id number] Patient Account Number: [id number] Date of Birth/Sex: 06-Jul-1931 (85 y.o. M) Treating RN: Harold Barban Primary Care Provider: Burman Freestone Other Clinician: Referring Provider: Burman Freestone Treating Provider/Extender: STONE III, HOYT Weeks in Treatment: 1 Constitutional Well-nourished and well-hydrated in no acute distress. Respiratory normal breathing without difficulty. clear to auscultation bilaterally. Cardiovascular regular rate and rhythm with normal S1, S2. Psychiatric this patient is able to make decisions and demonstrates good insight into disease process. Alert and Oriented x 3. pleasant and cooperative. Notes On evaluation today patient's wound bed actually shows a little bit more erythema than previous was also little bit of maceration. For this reason I do feel like he needs to have some very cream likely  right around the area where there's a lot of redundant tissue in the central portion of the wound. We will initiate that today. No debridement was performed today otherwise. Electronic Signature(s) Signed: 12/04/2018 12:02:18 AM By: Worthy Keeler PA-C Entered By: Worthy Keeler on 12/03/2018 17:19:17 [name] ([id number]) -------------------------------------------------------------------------------- Physician Orders Details Patient Name: IVO, MOGA Date of Service: 12/03/2018 3:45 PM Medical Record Number: [id number] Patient Account Number: [id number] Date of Birth/Sex: 1930/10/18 (85 y.o. M) Treating RN: Harold Barban Primary Care Provider: Burman Freestone Other Clinician: Referring Provider: Burman Freestone Treating Provider/Extender: [name], HOYT Weeks in Treatment: 1 Verbal / Phone Orders: No Diagnosis Coding ICD-10 Coding Code Description L89.154 Pressure ulcer of sacral region, stage 4 L02.31 Cutaneous abscess of buttock Wound Cleansing Wound #2 Midline Sacrum o Clean wound with Normal Saline. o May Shower, gently pat wound dry prior to applying new dressing. Anesthetic (add to Medication List) Wound #2 Midline Sacrum o Topical Lidocaine 4% cream applied to wound bed prior to debridement (In Clinic Only). Primary Wound Dressing Wound #2 Midline Sacrum o Silver Alginate - Apply a zinc based product ( [place]) around the wound Secondary Dressing Wound #2 Midline Sacrum o Boardered Foam Dressing Dressing Change Frequency Wound #2 Midline Sacrum o Change dressing every other day. Follow-up Appointments Wound #2 Midline Sacrum o Return Appointment in 1 week. Medications-please add to medication list. Wound #2 Midline Sacrum o P.O. Antibiotics - Finish Doxycycline Antibiotic Patient Medications Allergies: Lyrica, gabapentin, Sulfa (Sulfonamide Antibiotics), Iodinated Contrast- Oral and IV Dye, budesonide, iohexol, simvastatin,  Demerol, meperidine, adhesive tape, naproxen, pregabalin Notifications Medication Indication Start End doxycycline hyclate 12/03/2018 DOSE 1 - oral 100 mg capsule - 1 capsule oral taken 2 times a day for 14 days KEITHON, MCCOIN ([id number]) Electronic Signature(s) Signed: 12/03/2018 5:21:24 PM By: Worthy Keeler PA-C Entered By: Worthy Keeler on 12/03/2018 17:21:24 [name] ([id number]) -------------------------------------------------------------------------------- Problem List Details Patient Name: [name] Date of Service: 12/03/2018 3:45 PM Medical Record Number: [id number] Patient Account Number: [id number] Date of Birth/Sex: Nov 12, 1930 (85 y.o. M) Treating RN: Harold Barban Primary Care Provider: Burman Freestone Other Clinician: Referring Provider: Burman Freestone Treating Provider/Extender: [name], HOYT Weeks in Treatment: 1 Active Problems ICD-10 Evaluated Encounter Code Description Active Date Today Diagnosis L89.154 Pressure ulcer  of sacral region, stage 4 11/20/2018 No Yes L02.31 Cutaneous abscess of buttock 11/20/2018 No Yes Inactive Problems Resolved Problems Electronic Signature(s) Signed: 12/04/2018 12:02:18 AM By: Worthy Keeler PA-C Entered By: Worthy Keeler on 12/03/2018 16:13:15 [name] ([id number]) -------------------------------------------------------------------------------- Progress Note Details Patient Name: [name] Date of Service: 12/03/2018 3:45 PM Medical Record Number: [id number] Patient Account Number: [id number] Date of Birth/Sex: Mar 27, 1931 (85 y.o. M) Treating RN: Harold Barban Primary Care Provider: Burman Freestone Other Clinician: Referring Provider: Burman Freestone Treating Provider/Extender: [name], HOYT Weeks in Treatment: 1 Subjective Chief Complaint Information obtained from Patient Sacral pressure ulcer History of Present Illness (HPI) 02/06/18 on evaluation today patient presents for  initial evaluation and our clinic concerning an issue which began roughly 3 weeks ago when the patient fell in his home on the floor in his kitchen and laid him down this detergent for roughly 3 days. He had a pressure injury to the left shoulder. This unfortunately has caused him a lot of discomfort although it finally seems to be doing better if anything is really having a lot of itching right now. This appears potentially be a contact dermatitis issue. He also has a significant pressure injury to the sacrum at this time as well which is also showing fascia exposure right over the bone but no evidence of bone exposure at this point which is good news. They have been using Santyl as well as Saline soaked gauze at this point in time. There does appear to be a lot of necrotic slough in the base of the wound. He does have a history of incontinence, myocardial infarction, and hypertension. He also is "borderline diabetic" hemoglobin A1c of 6.0. Currently he has some discomfort in the pressure site at the sacrum but fortunately nothing too significant this did require sharp debridement today. 02/13/18 on evaluation today patient appears to be doing much better in regard to his sacral wound. He has been tolerating the dressing changes without complication with the Vashe. Fortunately there is no evidence of infection and though there is some Slough on the surface of the wound bed he has excellent granulation noted. Overall I'm pleased with how things have progressed in that regard. A glance at his shoulder as well and the rash seems to be someone improving in my pinion at this site as well. Overall I am pleased with what we're seeing and so is the family. 02/20/18 on evaluation today patient appears to be doing a little worse in regard to the sacral wound only in the fact that there is redness surrounding it has me somewhat concerned for infection. The drainage has also apparently been a little bit off  color compared to normal according to family they did keep the dressing today that was removed to show me and I agree this seems to be a little bit different compared to what we have been seeing. Coupled with the redness I'm concerned he may be developing some cellulitis surrounding the wound bed. 02/27/18 on evaluation today patient presents for follow-up concerning his sacral ulcer. We have received the results back from his wound culture which shows unfortunately that the doxycycline will not be of benefit for him I am going to need to initiate treatment with something else in order to treat the pseudomonas. Otherwise he does not seem to be having any significant pain although his daughter states there are sometimes when he states having pain. We continue to use the Vashe currently. 03/06/18 on evaluation today  patient's sacral wound appears to be doing better in my opinion. He has been tolerating the dressing changes without complication. With that being said the silver nitrate has helped with the prominent area of hyper granulation at the 6 o'clock location we will likely need to repeat this again today. Nonetheless overall I am pleased with how things have improved over the last week. The erythema surrounding the wound seems to be greatly improved. 03/13/18 on evaluation today patient's wound actually does not appear to be terribly infected although she does continue to have erythema surrounding the wound bed especially on the left border. I'm still somewhat concerned about the fact that the oral antibiotics alone may not be completely treating his infection. I previously discussed may need to go for IV antibiotic therapy I'm concerned that may be the case. We will need to make a referral today for infectious disease. 03/20/18 on evaluation today the patient sacral wound actually appears to be doing fairly well in regard to granulation although he continues unfortunately to have it your theme is  surrounding the periwound region. There's also some increased swelling at the 6 o'clock location which also has me somewhat concerned. With that being said he does have some discomfort but OSMAN, CALZADILLA. ([id number]) nothing too significant at this point. He still has not heard from infectious disease his daughter and wife are both present during the office visit today they're going to check back with this again. We did get the information for them to call them today. 03/27/18 on evaluation today patient is seen concerning his ongoing sacral ulcer. He has been tolerating the dressing changes without complication. With that being said he does present with evidence of bright green drainage noted on the dressing which again is something that I do often expect to see with a pseudomonas infection. He continues to have your theme is surrounding the wound bed as well and again I'm not 100% convinced this is just pressure related. I did speak with Colletta Maryland who is the nurse practitioner in [place] with infectious disease. I spoke with her actually yesterday concerning this patient. She is not 100% convinced that this is infected. She question whether the wound may just be colonized with Pseudomonas and not actually causing an active infection. I am really not thinking that the edema is associated with pressure alone and again overall I don't feel that your theme and is consistent with a pressure injury either as he's never had any contusions noted like a deep tissue injury on his heel which was new and I did visualize today this was on the left heel. Nonetheless she wanted to give this a little bit more time and thought it would be appropriate to start the Wound VAC at this point. 04/03/18 on evaluation today patient sacral ulcer actually appears to be doing fairly well at this point. He has been tolerating the dressing changes without complication. With that being said I'm very pleased with the progress  that has been made in regard to his sacral wound over the past week I do not see as much in the way of erythema which is great news. Nonetheless he does have a small area of hyper granulation unfortunately. This is at roughly the 7 o'clock location and I think does need to be addressed so that this will heal more appropriately. Nonetheless I think we may be ready to go ahead and initiate therapy with the Wound VAC. 04/10/18 on evaluation today patient appears to be doing excellent  in regard to his sacral ulcer. The show signs of great improvement in overall I'm very pleased with how things look. He has been tolerating the dressing changes without complication. Specifically this is the Wound VAC. He also seems to be doing well with the antibiotic there is decreased your theme and redness surrounding the sacral area at this point in the wound has filled in quite significantly. 04/17/18 on evaluation today patient actually appears to be doing excellent in regard to his sacral ulcer. He's been tolerating the dressing changes without complication specifically the Wound VAC. There really are no major concerns from the patient nor family this point he is having no pain. He does have a little bit of Epiboly on the lateral portions of the wound where he does have a little bit more depth that will need to be addressed today. 04/23/18 on evaluation today patient's wound actually appears to be doing excellent at this point. He has been tolerating the Wound VAC and this appears to be doing well other than the fact that it seems to be breaking seal at the 6 o'clock location. I do believe that adding a duodenum dressing at this location try and help maintain the seal would be appropriate and likely very effective. With that being said he overall seems to be showing signs of good improvement at this point. There does not appear to be any evidence of significant infection which is also excellent news. 04/30/18 on  evaluation today patient actually appears to be doing well in regard to his sacral ulcer. He's been tolerating the dressing changes without complication. Fortunately there does not appear to be any evidence of infection. Overall I'm very pleased with the progress that has been made up to this point. He does have some blistering underneath the draping unfortunately although this is definitely something that has been noted on other patients previously is a fairly common occurrence. Nonetheless the patient seems to be doing fairly well in general in my pinion based on what I see at this time. I do believe these are fairly superficial and minor. 05/07/18 on evaluation today patient's wound actually appears to be doing excellent at this point in time. He has been tolerating the Wound VAC decently well he states that it is somewhat cumbersome to carry around unfortunately. The only other issue he's been having according to family is that they been having a difficult time keeping the Wound VAC in place and doing what is supposed to do without making. Obviously I do think that this is definitely of concern. Nonetheless I do believe she's made good progress up to this point. I'm very happy in that regard. 05/14/18 on evaluation today patient's wound continues to make good progress at this point. He had a minimal amount of slough noted on the surface which was easily wiped away with saline and gauze and in general I feel like that he is continuing to show excellent progress even with the discontinuation of the Wound VAC. Overall I'm pleased in this regard. He was having issues with the Wound VAC in getting it to seal I think that using the Prisma at this time has been equally efficient and getting the wound to diminish in size. 05/29/18- He is here in follow up evaluation for a sacral ulcer. There is improvement, we will continue with prisma and he will follow up in two weeks NAMEER, SUMMER  ([id number]) 06/11/18 on evaluation today patient had unfortunately bright green drainage on the dressing upon evaluation  today. This is something that we have encountered before although we were able to get things under control previously with antibiotics. With that being said currently upon further inspection of the three areas of hyper granulation that were separate from the actual wound itself which was almost healed it really appears that these all have some depth to them. They are more tunnels that really have not closed or at least have reopened as a result likely of infection in my pinion. This is definitely not what I was expecting or hoping for. 06/26/18 on evaluation today patient continues to experience issues with what appears to be small abscesses in the sacral region unfortunately. With that being said he has been tolerating the dressing changes without complication which is good news. He's not having any significant discomfort which is also good news. With that being said his daughter states that after he left last week that the packing that we have placed fell out quite rapidly and he subsequently healed over very quick to the point they were not able to even repack the regions. Nonetheless there appear to be several fluctuance areas noted at this point there's one central region that does seem to be draining still discharge that is somewhat green in color. I did review the results of the wound culture which did show evidence of infection with both MRSA as well as pseudomonas based on that result. Nonetheless again with his other current medications we are not able to do the Cipro due to issues with potential long QT syndrome. I am going to give him a prescription for doxycycline in order to help with the potential MRSA infection. 07/09/18 on evaluation today patient actually appears to be doing about the same in regard to his sacral wound. He actually has his MRI scheduled for tomorrow  and then subsequently is going to be having his infectious disease appointment for Thursday of this week. Fortunately he's not having any significant discomfort he still has your theme in the sacral region he still has several blister/flux went areas although they technically are not blisters this is more like a underlying abscess. The one area that is open still does probe down to bone. Again I am concerned about a deeper infection possibly even sacral osteomyelitis. 07/23/18 on evaluation today patient actually appears to be doing very well all things considered in regard to his sacral ulcer. Since I've last seen him he actually did have his MRI performed which showed that he has a complex fluid collection superficial to the distal sacrum measuring 5.9 x 4.4 x 2.8 cm which abuts the posterior aspect of the distal sacrum and the sacrum itself shows cortical destruction consistent with osteomyelitis. She has also been seen by infectious disease and currently orders have been initiated for IV antibiotic therapy for the next eight weeks. He was seen by Janene Madeira NP and placed on Ceftazidime and Daptomycin. Currently he has not really been on this quite long enough to see a sufficient response to the new orders as far as antibiotic therapy is concerned. Nonetheless it does appear that he is likely on the right track at this point which is great news. Nonetheless the question which both Colletta Maryland and myself have discussed both with the patient and between ourselves is whether or not the patient may need to be seen by surgeon for surgical evacuation of the fluid collection/abscess and possible debridement of the sacrum itself. Nonetheless at this time my personal opinion is probably gonna be that we  wait and get this at least a couple weeks to see the response that he receives with the IV antibiotic therapy. 08/06/18 on evaluation today patient actually appears to be doing rather well in regard to  his sacral ulcer region all things considered. He has been tolerating the dressing changes without complication. With that being said there is not any obvious opening nor any drainage noted at this point in time upon evaluation. The patient has been tolerating the dressing changes though. He's doing well with the IV antibiotic therapy. 08/20/18 on evaluation today patient appears to be doing wonderful in regard to his sacral region. In fact there appears to be no wound opening or drainage at this time. Overall I'm very pleased with how things have progressed. The patient likewise as well as his wife and daughter are very happy as well. Readmission: 11/20/18 on evaluation today patient presents for reevaluation our clinic concerning issues with his sacral region. Unfortunately the area in question is the same region which we previously treated what we thought would successfully back in November 2019. At that time the patient underwent IV antibiotic therapy which seemed to do the job very well. Subsequently however in the past couple of weeks he has begun to have drainage and bleeding from the sacral region and is having increased pain yet again. Fortunately there is no signs of systemic infection although it does appear that the complex abscess that was previously noted may not have fully cleared there was a question between myself as well is infectious disease previous whether not he needed to see a surgeon being that things got better the family opted not to see a surgeon at that time. Nonetheless I am concerned that he may the need to see a surgeon in order to have this area surgically debrided and possibly a bone culture obtained in order to get a better idea of what we're treating and ensure that this is able to completely and fully heal. 11/27/18 on evaluation today patient appears to be doing about the same in regard to his sacral ulcer. He did see Dr. Viviano Simas, [name]  ([id number]) yesterday and the plan is to proceed forward with surgery to clean out the region. This seems to be the most appropriate goal of treatment at this point. Dr. Lysle Pearl just needed to speak with Dr. Ellene Route the patient's neurologist in order to get clearance as the patient was supposed to be scheduled for a carpal tunnel and elbow surgery with Dr. Ellene Route. Nonetheless he has apparently given clearance to proceed with this surgery for the sacral region which is deemed more important at this point. Unfortunately the patient had a little bit of increased pain although he is having redness at this point there does not appear to be any spreading infection which is good news. No fevers, chills, nausea, or vomiting noted at this time. 12/03/18 on evaluation today patient actually appears to be doing about the same in regard to the sacral ulcer. He still waiting to hear back from Dr. Ines Bloomer office in regard to scheduling his surgery. Fortunately there's no signs of systemic infection at this time which is good news. Unfortunately he really does not seem to making any progress the doxycycline does seem to at least be beneficial in helping to hold off the infection from worsening although unfortunately he still has a lot going on in this regard. Patient's daughter states she's actually gonna contact her office tomorrow to see what exactly is going on. Patient  History Information obtained from Patient. Family History Cancer - Paternal Grandparents, Diabetes - Father, Heart Disease - Mother,Father, Stroke - Father, No family history of Hypertension, Kidney Disease, Lung Disease, Seizures, Thyroid Problems, Tuberculosis. Social History Never smoker, Marital Status - Widowed, Alcohol Use - Never, Drug Use - No History, Caffeine Use - Daily. Medical History Eyes Patient has history of Cataracts - bilateral removal Denies history of Glaucoma, Optic Neuritis Ear/Nose/Mouth/Throat Denies history of  Chronic sinus problems/congestion, Middle ear problems Hematologic/Lymphatic Denies history of Anemia, Hemophilia, Human Immunodeficiency Virus, Lymphedema, Sickle Cell Disease Respiratory Patient has history of Asthma Denies history of Aspiration, Chronic Obstructive Pulmonary Disease (COPD), Pneumothorax, Sleep Apnea, Tuberculosis Cardiovascular Patient has history of Angina, Arrhythmia, Coronary Artery Disease, Hypertension, Myocardial Infarction - 2001 Denies history of Congestive Heart Failure, Deep Vein Thrombosis, Hypotension, Peripheral Arterial Disease, Peripheral Venous Disease, Phlebitis, Vasculitis Gastrointestinal Denies history of Cirrhosis , Colitis, Crohn s, Hepatitis A, Hepatitis B, Hepatitis C Endocrine Denies history of Type I Diabetes, Type II Diabetes Genitourinary Denies history of End Stage Renal Disease Immunological Denies history of Lupus Erythematosus, Raynaud s, Scleroderma Integumentary (Skin) Denies history of History of Burn, History of pressure wounds Musculoskeletal Patient has history of Osteoarthritis Denies history of Gout, Rheumatoid Arthritis, Osteomyelitis Neurologic Patient has history of Dementia, Neuropathy Denies history of Quadriplegia, Paraplegia, Seizure Disorder Oncologic Denies history of Received Chemotherapy, Received Radiation Psychiatric Denies history of Hollace Hayward, Confinement Anxiety ZANDEN, COLVER ([id number]) Hospitalization/Surgery History - 01/20/2018, ARMS, Fall. 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 Respiratory The patient has no complaints or symptoms. Cardiovascular The patient has no complaints or symptoms. Psychiatric The patient has no complaints or symptoms. Objective Constitutional Well-nourished and well-hydrated in no acute  distress. Vitals Time Taken: 4:21 PM, Height: 69 in, Weight: 170.6 lbs, BMI: 25.2, Temperature: 98.4 F, Pulse: 67 bpm, Respiratory Rate: 16 breaths/min, Blood Pressure: 135/87 mmHg. Respiratory normal breathing without difficulty. clear to auscultation bilaterally. Cardiovascular regular rate and rhythm with normal S1, S2. Psychiatric this patient is able to make decisions and demonstrates good insight into disease process. Alert and Oriented x 3. pleasant and cooperative. General Notes: On evaluation today patient's wound bed actually shows a little bit more erythema than previous was also little bit of maceration. For this reason I do feel like he needs to have some very cream likely right around the area where there's a lot of redundant tissue in the central portion of the wound. We will initiate that today. No debridement was performed today otherwise. Integumentary (Hair, Skin) Wound #2 status is Open. Original cause of wound was Bump. The wound is located on the Midline Sacrum. The wound measures 1.5cm length x 0.8cm width x 2.3cm depth; 0.942cm^2 area and 2.168cm^3 volume. There is Fat Layer (Subcutaneous Tissue) Exposed exposed. There is no tunneling or undermining noted. There is a medium amount of serous drainage noted. The wound margin is indistinct and nonvisible. There is medium (34-66%) red granulation within the wound bed. There is no necrotic tissue within the wound bed. The periwound skin appearance exhibited: Erythema. The periwound skin appearance did not exhibit: Callus, Crepitus, Excoriation, Induration, Rash, Scarring, Dry/Scaly, Maceration, Atrophie Blanche, Cyanosis, Ecchymosis, Hemosiderin Staining, Mottled, Pallor, Rubor. The surrounding wound skin color is noted VADHIR, MCNAY. ([id number]) with erythema. Periwound temperature was noted as No Abnormality. The periwound has tenderness on palpation. Assessment Active Problems ICD-10 Pressure ulcer of  sacral  region, stage 4 Cutaneous abscess of buttock Plan Wound Cleansing: Wound #2 Midline Sacrum: Clean wound with Normal Saline. May Shower, gently pat wound dry prior to applying new dressing. Anesthetic (add to Medication List): Wound #2 Midline Sacrum: Topical Lidocaine 4% cream applied to wound bed prior to debridement (In Clinic Only). Primary Wound Dressing: Wound #2 Midline Sacrum: Silver Alginate - Apply a zinc based product ( [place]) around the wound Secondary Dressing: Wound #2 Midline Sacrum: Boardered Foam Dressing Dressing Change Frequency: Wound #2 Midline Sacrum: Change dressing every other day. Follow-up Appointments: Wound #2 Midline Sacrum: Return Appointment in 1 week. Medications-please add to medication list.: Wound #2 Midline Sacrum: P.O. Antibiotics - Finish Doxycycline Antibiotic The following medication(s) was prescribed: doxycycline hyclate oral 100 mg capsule 1 1 capsule oral taken 2 times a day for 14 days starting 12/03/2018 Patient's wound bed at this point showed signs of poor granulation and actually still extends down to the sacral bone based on me evaluating and probing the wound. Unfortunately I still think surgery is gonna be the necessary step at this point the patient is in agreement the plan. We will subsequently see were things stand at follow-up. Please see above for specific wound care orders. We will see patient for re-evaluation in 1 week(s) here in the clinic. If anything worsens or changes patient will contact our office for additional recommendations. I am gonna place him on a extension of the doxycycline in order to keep him on an antibiotic until such time as the surgery actually takes place. CATLIN, AYCOCK ([id number]) Electronic Signature(s) Signed: 12/04/2018 12:02:18 AM By: Worthy Keeler PA-C Entered By: Worthy Keeler on 12/03/2018 17:28:00 DOLPH, TAVANO  ([id number]) -------------------------------------------------------------------------------- ROS/PFSH Details Patient Name: [name] Date of Service: 12/03/2018 3:45 PM Medical Record Number: [id number] Patient Account Number: [id number] Date of Birth/Sex: [birth year] (85 y.o. M) Treating RN: Harold Barban Primary Care Provider: Burman Freestone Other Clinician: Referring Provider: Burman Freestone Treating Provider/Extender: [name], HOYT Weeks in Treatment: 1 Information Obtained From Patient Wound History Do you currently have one or more open woundso Yes How many open wounds do you currently haveo 1 Approximately how long have you had your woundso 2 or 3 weeks How have you been treating your wound(s) until nowo bordered foam dressing Has your wound(s) ever healed and then re-openedo Yes Have you had any lab work done in the past montho No Have you tested positive for an antibiotic resistant organism (MRSA, VRE)o Yes Have you tested positive for osteomyelitis (bone infection)o No Have you had any tests for circulation on your legso No Constitutional Symptoms (General Health) Complaints and Symptoms: Negative for: Fever; Chills Eyes Medical History: Positive for: Cataracts - bilateral removal Negative for: Glaucoma; Optic Neuritis Ear/Nose/Mouth/Throat Medical History: Negative for: Chronic sinus problems/congestion; Middle ear problems Hematologic/Lymphatic Medical History: Negative for: Anemia; Hemophilia; Human Immunodeficiency Virus; Lymphedema; Sickle Cell Disease Respiratory Complaints and Symptoms: No Complaints or Symptoms Medical History: Positive for: Asthma Negative for: Aspiration; Chronic Obstructive Pulmonary Disease (COPD); Pneumothorax; Sleep Apnea; Tuberculosis Cardiovascular Complaints and Symptoms: No Complaints or Symptoms Medical HistoryMarland Kitchen TAHJIR, SILVERIA ([id number]) Positive for: Angina; Arrhythmia; Coronary Artery Disease;  Hypertension; Myocardial Infarction - 2001 Negative for: Congestive Heart Failure; Deep Vein Thrombosis; Hypotension; Peripheral Arterial Disease; Peripheral Venous Disease; Phlebitis; Vasculitis Gastrointestinal Medical History: Negative for: Cirrhosis ; Colitis; Crohnos; Hepatitis A; Hepatitis B; Hepatitis C Endocrine Medical History: Negative for: Type I Diabetes; Type II Diabetes Past Medical History Notes: Borderline  Genitourinary Medical History: Negative for: End Stage Renal Disease Immunological Medical History: Negative for: Lupus Erythematosus; Raynaudos; Scleroderma Integumentary (Skin) Medical History: Negative for: History of Burn; History of pressure wounds Musculoskeletal Medical History: Positive for: Osteoarthritis Negative for: Gout; Rheumatoid Arthritis; Osteomyelitis Neurologic Medical History: Positive for: Dementia; Neuropathy Negative for: Quadriplegia; Paraplegia; Seizure Disorder Oncologic Medical History: Negative for: Received Chemotherapy; Received Radiation Past Medical History Notes: Melanoma on back Psychiatric Complaints and Symptoms: No Complaints or Symptoms Medical History: Negative for: Anorexia/bulimia; Confinement Anxiety HBO Extended History Items ALEZANDER, DIMAANO ([id number]) Eyes: Cataracts Immunizations Pneumococcal Vaccine: Received Pneumococcal Vaccination: Yes Implantable Devices Hospitalization / Surgery History Name of Hospital Purpose of Hospitalization/[place] Date [facility] Fall 01/20/2018 Family and Social History Cancer: Yes - Paternal Grandparents; Diabetes: Yes - Father; Heart Disease: Yes - Mother,Father; Hypertension: No; Kidney Disease: No; Lung Disease: No; Seizures: No; Stroke: Yes - Father; Thyroid Problems: No; Tuberculosis: No; Never smoker; Marital Status - Widowed; Alcohol Use: Never; Drug Use: No History; Caffeine Use: Daily; Financial Concerns: No; Food, Clothing or Shelter Needs: No; Support System  Lacking: No; Transportation Concerns: No; Advanced Directives: Yes (Not Provided); Patient does not want information on Advanced Directives; Do not resuscitate: Yes (Not Provided); Living Will: Yes (Not Provided); Medical Power of Attorney: Yes - Salam Micucci (Not Provided) Physician Affirmation I have reviewed and agree with the above information. Electronic Signature(s) Signed: 12/04/2018 12:02:18 AM By: Worthy Keeler PA-C Signed: 12/05/2018 8:37:22 AM By: Harold Barban Entered By: Worthy Keeler on 12/03/2018 17:18:35 [name] ([id number]) -------------------------------------------------------------------------------- SuperBill Details Patient Name: [name] Date of Service: 12/03/2018 Medical Record Number: [id number] Patient Account Number: [id number] Date of Birth/Sex: 30-Dec-1930 (85 y.o. M) Treating RN: Harold Barban Primary Care Provider: Burman Freestone Other Clinician: Referring Provider: Burman Freestone Treating Provider/Extender: [name], HOYT Weeks in Treatment: 1 Diagnosis Coding ICD-10 Codes Code Description L89.154 Pressure ulcer of sacral region, stage 4 L02.31 Cutaneous abscess of buttock Physician Procedures CPT4 Code: [id number] Description: 41962 - WC PHYS LEVEL 4 - EST PT ICD-10 Diagnosis Description L89.154 Pressure ulcer of sacral region, stage 4 L02.31 Cutaneous abscess of buttock Modifier: Quantity: 1 Electronic Signature(s) Signed: 12/04/2018 12:02:18 AM By: Worthy Keeler PA-C Entered By: Worthy Keeler on 12/03/2018 17:28:15

## 2018-12-05 NOTE — Telephone Encounter (Signed)
Already addressed in duplicate clearance request 11/27/18.

## 2018-12-08 NOTE — Progress Notes (Signed)
BLAKELY, GLUTH ([id number]) Visit Report for 12/03/2018 Arrival Information Details Patient Name: [name], [name]. Date of Service: 12/03/2018 3:45 PM Medical Record Number: [id number] Patient Account Number: [id number] Date of Birth/Sex: Sep 30, 1931 (85 y.o. M) Treating RN: Harold Barban Primary Care Rainer Mounce: Burman Freestone Other Clinician: Referring Tahirah Sara: Burman Freestone Treating Inesha Sow/Extender: [name], HOYT Weeks in Treatment: 1 Visit Information History Since Last Visit Added or deleted any medications: No Patient Arrived: Cane Any new allergies or adverse reactions: No Arrival Time: 16:20 Had a fall or experienced change in No Accompanied By: wife and daughter activities of daily living that may affect Transfer Assistance: None risk of falls: Patient Identification Verified: Yes Signs or symptoms of abuse/neglect since last visito No Secondary Verification Process Yes Hospitalized since last visit: No Completed: Implantable device outside of the clinic excluding No Patient Has Alerts: Yes cellular tissue based products placed in the center Patient Alerts: Patient on Blood since last visit: Thinner Has Dressing in Place as Prescribed: Yes Eliquis Pain Present Now: Yes Electronic Signature(s) Signed: 12/03/2018 4:34:54 PM By: Lorine Bears RCP, RRT, CHT Entered By: Lorine Bears on 12/03/2018 16:20:50 [name] ([id number]) -------------------------------------------------------------------------------- Clinic Level of Care Assessment Details Patient Name: [name] Date of Service: 12/03/2018 3:45 PM Medical Record Number: [id number] Patient Account Number: [id number] Date of Birth/Sex: [birth year] (85 y.o. M) Treating RN: Harold Barban Primary Care Disney Ruggiero: Burman Freestone Other Clinician: Referring Ronith Berti: Burman Freestone Treating Kiyah Demartini/Extender: [name], HOYT Weeks in Treatment: 1 Clinic Level  of Care Assessment Items TOOL 4 Quantity Score []  - Use when only an EandM is performed on FOLLOW-UP visit 0 ASSESSMENTS - Nursing Assessment / Reassessment X - Reassessment of Co-morbidities (includes updates in patient status) 1 10 X- 1 5 Reassessment of Adherence to Treatment Plan ASSESSMENTS - Wound and Skin Assessment / Reassessment X - Simple Wound Assessment / Reassessment - one wound 1 5 []  - 0 Complex Wound Assessment / Reassessment - multiple wounds []  - 0 Dermatologic / Skin Assessment (not related to wound area) ASSESSMENTS - Focused Assessment []  - Circumferential Edema Measurements - multi extremities 0 []  - 0 Nutritional Assessment / Counseling / Intervention []  - 0 Lower Extremity Assessment (monofilament, tuning fork, pulses) []  - 0 Peripheral Arterial Disease Assessment (using hand held doppler) ASSESSMENTS - Ostomy and/or Continence Assessment and Care []  - Incontinence Assessment and Management 0 []  - 0 Ostomy Care Assessment and Management (repouching, etc.) PROCESS - Coordination of Care X - Simple Patient / Family Education for ongoing care 1 15 []  - 0 Complex (extensive) Patient / Family Education for ongoing care []  - 0 Staff obtains Programmer, systems, Records, Test Results / Process Orders []  - 0 Staff telephones HHA, Nursing Homes / Clarify orders / etc []  - 0 Routine Transfer to another Facility (non-emergent condition) []  - 0 Routine Hospital Admission (non-emergent condition) []  - 0 New Admissions / Biomedical engineer / Ordering NPWT, Apligraf, etc. []  - 0 Emergency Hospital Admission (emergent condition) X- 1 10 Simple Discharge Coordination JOQUAN, LOTZ ([id number]) []  - 0 Complex (extensive) Discharge Coordination PROCESS - Special Needs []  - Pediatric / Minor Patient Management 0 []  - 0 Isolation Patient Management []  - 0 Hearing / Language / Visual special needs []  - 0 Assessment of Community assistance (transportation, D/C  planning, etc.) []  - 0 Additional assistance / Altered mentation []  - 0 Support Surface(s) Assessment (bed, cushion, seat, etc.) INTERVENTIONS - Wound Cleansing / Measurement X - Simple Wound Cleansing -  one wound 1 5 []  - 0 Complex Wound Cleansing - multiple wounds X- 1 5 Wound Imaging (photographs - any number of wounds) []  - 0 Wound Tracing (instead of photographs) X- 1 5 Simple Wound Measurement - one wound []  - 0 Complex Wound Measurement - multiple wounds INTERVENTIONS - Wound Dressings X - Small Wound Dressing one or multiple wounds 1 10 []  - 0 Medium Wound Dressing one or multiple wounds []  - 0 Large Wound Dressing one or multiple wounds []  - 0 Application of Medications - topical []  - 0 Application of Medications - injection INTERVENTIONS - Miscellaneous []  - External ear exam 0 []  - 0 Specimen Collection (cultures, biopsies, blood, body fluids, etc.) []  - 0 Specimen(s) / Culture(s) sent or taken to Lab for analysis []  - 0 Patient Transfer (multiple staff / Civil Service fast streamer / Similar devices) []  - 0 Simple Staple / Suture removal (25 or less) []  - 0 Complex Staple / Suture removal (26 or more) []  - 0 Hypo / Hyperglycemic Management (close monitor of Blood Glucose) []  - 0 Ankle / Brachial Index (ABI) - do not check if billed separately X- 1 5 Vital Signs JIYAAN, STEINHAUSER ([id number]) Has the patient been seen at the hospital within the last three years: Yes Total Score: 75 Level Of Care: New/Established - Level 2 Electronic Signature(s) Unsigned Entered By: Harold Barban on 12/06/2018 11:51:36 Signature(s): Date(s): [name] ([id number]) -------------------------------------------------------------------------------- Encounter Discharge Information Details Patient Name: UNNAMED, ZEIEN. Date of Service: 12/03/2018 3:45 PM Medical Record Number: [id number] Patient Account Number: [id number] Date of Birth/Sex: 16-Mar-1931 (85 y.o. M) Treating RN:  Montey Hora Primary Care Kei Mcelhiney: Burman Freestone Other Clinician: Referring Daniell Mancinas: Burman Freestone Treating Kacy Conely/Extender: [name], HOYT Weeks in Treatment: 1 Encounter Discharge Information Items Discharge Condition: Stable Ambulatory Status: Cane Discharge Destination: Home Transportation: Private Auto Accompanied By: self Schedule Follow-up Appointment: Yes Clinical Summary of Care: Electronic Signature(s) Signed: 12/05/2018 11:47:27 AM By: Montey Hora Entered By: Montey Hora on 12/05/2018 11:47:27 [name] ([id number]) -------------------------------------------------------------------------------- Lower Extremity Assessment Details Patient Name: [name] Date of Service: 12/03/2018 3:45 PM Medical Record Number: [id number] Patient Account Number: [id number] Date of Birth/Sex: 02/21/1931 (85 y.o. M) Treating RN: Army Melia Primary Care Krishav Mamone: Burman Freestone Other Clinician: Referring Lunden Mcleish: Burman Freestone Treating Kiearra Oyervides/Extender: [name], HOYT Weeks in Treatment: 1 Electronic Signature(s) Signed: 12/07/2018 4:09:31 PM By: Army Melia Entered By: Army Melia on 12/03/2018 16:33:59 [name] ([id number]) -------------------------------------------------------------------------------- Multi Wound Chart Details Patient Name: [name] Date of Service: 12/03/2018 3:45 PM Medical Record Number: [id number] Patient Account Number: [id number] Date of Birth/Sex: 04-26-1931 (85 y.o. M) Treating RN: Harold Barban Primary Care Cleston Lautner: Burman Freestone Other Clinician: Referring Jaston Havens: Burman Freestone Treating Tyshea Imel/Extender: [name], HOYT Weeks in Treatment: 1 Vital Signs Height(in): 69 Pulse(bpm): 9 Weight(lbs): 170.6 Blood Pressure(mmHg): 135/87 Body Mass Index(BMI): 25 Temperature(F): 98.4 Respiratory Rate 16 (breaths/min): Photos: [2:No Photos] [N/A:N/A] Wound Location: [2:Sacrum -  Midline] [N/A:N/A] Wounding Event: [2:Bump] [N/A:N/A] Primary Etiology: [2:Atypical] [N/A:N/A] Comorbid History: [2:Cataracts, Asthma, Angina, Arrhythmia, Coronary Artery Disease, Hypertension, Myocardial Infarction, Osteoarthritis, Dementia, Neuropathy] [N/A:N/A] Date Acquired: [2:10/30/2018] [N/A:N/A] Weeks of Treatment: [2:1] [N/A:N/A] Wound Status: [2:Open] [N/A:N/A] Measurements L x W x D [2:1.5x0.8x0.1] [N/A:N/A] (cm) Area (cm) : [2:0.942] [N/A:N/A] Volume (cm) : [2:0.094] [N/A:N/A] % Reduction in Area: [2:-11675.00%] [N/A:N/A] % Reduction in Volume: [2:-370.00%] [N/A:N/A] Classification: [2:Full Thickness Without Exposed Support Structures] [N/A:N/A] Exudate Amount: [2:Medium] [N/A:N/A] Exudate Type: [2:Serous] [N/A:N/A] Exudate Color: [2:amber] [N/A:N/A] Wound Margin: [  2:Indistinct, nonvisible] [N/A:N/A] Granulation Amount: [2:Medium (34-66%)] [N/A:N/A] Granulation Quality: [2:Red] [N/A:N/A] Necrotic Amount: [2:None Present (0%)] [N/A:N/A] Exposed Structures: [2:Fat Layer (Subcutaneous Tissue) Exposed: Yes Fascia: No Tendon: No Muscle: No Joint: No Bone: No] [N/A:N/A] Epithelialization: [2:Medium (34-66%)] [N/A:N/A] Periwound Skin Texture: [N/A:N/A] Excoriation: No Induration: No Callus: No Crepitus: No Rash: No Scarring: No Periwound Skin Moisture: Maceration: No N/A N/A Dry/Scaly: No Periwound Skin Color: Erythema: Yes N/A N/A Atrophie Blanche: No Cyanosis: No Ecchymosis: No Hemosiderin Staining: No Mottled: No Pallor: No Rubor: No Temperature: No Abnormality N/A N/A Tenderness on Palpation: Yes N/A N/A Wound Preparation: Ulcer Cleansing: N/A N/A Rinsed/Irrigated with Saline Topical Anesthetic Applied: Other: lidocaine 4% Treatment Notes Electronic Signature(s) Signed: 12/05/2018 8:37:22 AM By: Harold Barban Entered By: Harold Barban on 12/03/2018 16:42:19 [name]  ([id number]) -------------------------------------------------------------------------------- [place] Details Patient Name: JEREMEY, BASCOM Date of Service: 12/03/2018 3:45 PM Medical Record Number: [id number] Patient Account Number: [id number] Date of Birth/Sex: 02-01-1931 (85 y.o. M) Treating RN: Harold Barban Primary Care Jakai Onofre: Burman Freestone Other Clinician: Referring Jeromiah Ohalloran: Burman Freestone Treating Nickolas Chalfin/Extender: [name], HOYT Weeks in Treatment: 1 Active Inactive Abuse / Safety / Falls / Self Care Management Nursing Diagnoses: Potential for falls Goals: Patient will not experience any injury related to falls Date Initiated: 11/20/2018 Target Resolution Date: 02/15/2019 Goal Status: Active Interventions: Assess fall risk on admission and as needed Notes: Orientation to the Wound Care Program Nursing Diagnoses: Knowledge deficit related to the wound healing center program Goals: Patient/caregiver will verbalize understanding of the [place] Program Date Initiated: 11/20/2018 Target Resolution Date: 02/15/2019 Goal Status: Active Interventions: Provide education on orientation to the wound center Notes: Wound/Skin Impairment Nursing Diagnoses: Impaired tissue integrity Goals: Ulcer/skin breakdown will heal within 14 weeks Date Initiated: 11/20/2018 Target Resolution Date: 02/15/2019 Goal Status: Active Interventions: Assess patient/caregiver ability to obtain necessary supplies ASA, BAUDOIN ([id number]) Assess patient/caregiver ability to perform ulcer/skin care regimen upon admission and as needed Assess ulceration(s) every visit Notes: Electronic Signature(s) Signed: 12/05/2018 8:37:22 AM By: Harold Barban Entered By: Harold Barban on 12/03/2018 16:42:10 [name] ([id number]) -------------------------------------------------------------------------------- Pain Assessment Details Patient Name: [name] Date of Service: 12/03/2018 3:45 PM Medical Record Number: [id number] Patient Account Number: [id number] Date of Birth/Sex: 12-21-1930 (85 y.o. M) Treating RN: Harold Barban Primary Care Sekou Zuckerman: Burman Freestone Other Clinician: Referring Saul Fabiano: Burman Freestone Treating Adhvik Canady/Extender: [name], HOYT Weeks in Treatment: 1 Active Problems Location of Pain Severity and Description of Pain Patient Has Paino Yes Site Locations Rate the pain. Current Pain Level: 7 Pain Management and Medication Current Pain Management: Electronic Signature(s) Signed: 12/03/2018 4:34:54 PM By: Lorine Bears RCP, RRT, CHT Signed: 12/05/2018 8:37:22 AM By: Harold Barban Entered By: Lorine Bears on 12/03/2018 16:20:59 [name] ([id number]) -------------------------------------------------------------------------------- Patient/Caregiver Education Details Patient Name: YOON, BARCA Date of Service: 12/03/2018 3:45 PM Medical Record Number: [id number] Patient Account Number: [id number] Date of Birth/Gender: [birth year] (85 y.o. M) Treating RN: Harold Barban Primary Care Physician: Burman Freestone Other Clinician: Referring Physician: Burman Freestone Treating Physician/Extender: Sharalyn Ink in Treatment: 1 Education Assessment Education Provided To: Patient Education Topics Provided Wound/Skin Impairment: Handouts: Caring for Your Ulcer Methods: Demonstration, Explain/Verbal Responses: State content correctly Electronic Signature(s) Signed: 12/05/2018 8:37:22 AM By: Harold Barban Entered By: Harold Barban on 12/03/2018 16:42:33 [name] ([id number]) -------------------------------------------------------------------------------- Wound Assessment Details Patient Name: [name] Date of Service: 12/03/2018 3:45 PM Medical Record Number: [id number] Patient Account Number: [id number] Date  of Birth/Sex: 03-May-1931  (85 y.o. M) Treating RN: Harold Barban Primary Care Ailah Barna: Burman Freestone Other Clinician: Referring Mirza Kidney: Burman Freestone Treating Ifeoma Vallin/Extender: [name], HOYT Weeks in Treatment: 1 Wound Status Wound Number: 2 Primary Atypical Etiology: Wound Location: Sacrum - Midline Wound Open Wounding Event: Bump Status: Date Acquired: 10/30/2018 Comorbid Cataracts, Asthma, Angina, Arrhythmia, Weeks Of Treatment: 1 History: Coronary Artery Disease, Hypertension, Clustered Wound: No Myocardial Infarction, Osteoarthritis, Dementia, Neuropathy Photos Photo Uploaded By: Army Melia on 12/04/2018 10:01:25 Wound Measurements Length: (cm) 1.5 Width: (cm) 0.8 Depth: (cm) 2.3 Area: (cm) 0.942 Volume: (cm) 2.168 % Reduction in Area: -11675% % Reduction in Volume: -10740% Epithelialization: Medium (34-66%) Tunneling: No Undermining: No Wound Description Full Thickness Without Exposed Support Foul Od Classification: Structures Slough/ Wound Margin: Indistinct, nonvisible Exudate Medium Amount: Exudate Type: Serous Exudate Color: amber or After Cleansing: No Fibrino No Wound Bed Granulation Amount: Medium (34-66%) Exposed Structure Granulation Quality: Red Fascia Exposed: No Necrotic Amount: None Present (0%) Fat Layer (Subcutaneous Tissue) Exposed: Yes Tendon Exposed: No Muscle Exposed: No Joint Exposed: No KVEON, CASANAS ([id number]) Bone Exposed: No Periwound Skin Texture Texture Color No Abnormalities Noted: No No Abnormalities Noted: No Callus: No Atrophie Blanche: No Crepitus: No Cyanosis: No Excoriation: No Ecchymosis: No Induration: No Erythema: Yes Rash: No Hemosiderin Staining: No Scarring: No Mottled: No Pallor: No Moisture Rubor: No No Abnormalities Noted: No Dry / Scaly: No Temperature / Pain Maceration: No Temperature: No Abnormality Tenderness on Palpation: Yes Wound Preparation Ulcer Cleansing: Rinsed/Irrigated with  Saline Topical Anesthetic Applied: Other: lidocaine 4%, Treatment Notes Wound #2 (Midline Sacrum) Notes silvercel, bordered foam dressing Electronic Signature(s) Signed: 12/05/2018 8:37:22 AM By: Harold Barban Entered By: Harold Barban on 12/03/2018 16:43:43 [name] ([id number]) -------------------------------------------------------------------------------- Vitals Details Patient Name: [name] Date of Service: 12/03/2018 3:45 PM Medical Record Number: [id number] Patient Account Number: [id number] Date of Birth/Sex: 03/27/1931 (85 y.o. M) Treating RN: Harold Barban Primary Care Miaa Latterell: Burman Freestone Other Clinician: Referring Avia Merkley: Burman Freestone Treating Slyvia Lartigue/Extender: [name], HOYT Weeks in Treatment: 1 Vital Signs Time Taken: 16:21 Temperature (F): 98.4 Height (in): 69 Pulse (bpm): 67 Weight (lbs): 170.6 Respiratory Rate (breaths/min): 16 Body Mass Index (BMI): 25.2 Blood Pressure (mmHg): 135/87 Reference Range: 80 - 120 mg / dl Electronic Signature(s) Signed: 12/03/2018 4:34:54 PM By: Lorine Bears RCP, RRT, CHT Entered By: Lorine Bears on 12/03/2018 16:23:12

## 2018-12-10 ENCOUNTER — Encounter: Payer: Medicare Other | Admitting: Physician Assistant

## 2018-12-10 DIAGNOSIS — L89154 Pressure ulcer of sacral region, stage 4: Secondary | ICD-10-CM | POA: Diagnosis not present

## 2018-12-10 NOTE — Telephone Encounter (Signed)
   Primary Cardiologist:Paula Harrington Challenger, MD  Chart reviewed as part of pre-operative protocol coverage. It looks like the anticoagulation was addressed but clearance was not addressed. As below, pt has appt in 4 days wiith Dr. Harrington Challenger and clearance can be addressed at that time.   Pre-op covering staff: - This pt has an appt with Dr. Harrington Challenger on 12/14/18. Please place preop clearance in the comments - Please contact requesting surgeon's office via preferred method (i.e, phone, fax) to inform them of need for appointment prior to surgery.   Daune Perch, NP  12/10/2018, 3:16 PM

## 2018-12-10 NOTE — Telephone Encounter (Signed)
°  There are 2 clearance encounters. [facility] calling. Please fax clearance to  [phone]

## 2018-12-10 NOTE — Telephone Encounter (Signed)
Routed to the requesting surgeon's office that pt needs to be seen in order to be cleared and pt is scheduled 12/14/2018.

## 2018-12-11 NOTE — Progress Notes (Signed)
[name] ([id number]) Visit Report for 12/10/2018 Chief Complaint Document Details Patient Name: [name]. Date of Service: 12/10/2018 1:15 PM Medical Record Number: [id number] Patient Account Number: [id number] Date of Birth/Sex: [birth year] (85 y.o. M) Treating RN: Harold Barban Primary Care Provider: Burman Freestone Other Clinician: Referring Provider: Burman Freestone Treating Provider/Extender: [name], Shahed Yeoman Weeks in Treatment: 2 Information Obtained from: Patient Chief Complaint Sacral pressure ulcer Electronic Signature(s) Signed: 12/11/2018 8:52:26 AM By: Worthy Keeler PA-C Entered By: Worthy Keeler on 12/10/2018 14:10:33 [name] ([id number]) -------------------------------------------------------------------------------- HPI Details Patient Name: [name] Date of Service: 12/10/2018 1:15 PM Medical Record Number: [id number] Patient Account Number: [id number] Date of Birth/Sex: [birth year] (85 y.o. M) Treating RN: Harold Barban Primary Care Provider: Burman Freestone Other Clinician: Referring Provider: Burman Freestone Treating Provider/Extender: [name], Kailea Dannemiller Weeks in Treatment: 2 History of Present Illness HPI Description: 02/06/18 on evaluation today patient presents for initial evaluation and our clinic concerning an issue which began roughly 3 weeks ago when the patient fell in his home on the floor in his kitchen and laid him down this detergent for roughly 3 days. He had a pressure injury to the left shoulder. This unfortunately has caused him a lot of discomfort although it finally seems to be doing better if anything is really having a lot of itching right now. This appears potentially be a contact dermatitis issue. He also has a significant pressure injury to the sacrum at this time as well which is also showing fascia exposure right over the bone but no evidence of bone exposure at this point which is good news. They have  been using Santyl as well as Saline soaked gauze at this point in time. There does appear to be a lot of necrotic slough in the base of the wound. He does have a history of incontinence, myocardial infarction, and hypertension. He also is "borderline diabetic" hemoglobin A1c of 6.0. Currently he has some discomfort in the pressure site at the sacrum but fortunately nothing too significant this did require sharp debridement today. 02/13/18 on evaluation today patient appears to be doing much better in regard to his sacral wound. He has been tolerating the dressing changes without complication with the Vashe. Fortunately there is no evidence of infection and though there is some Slough on the surface of the wound bed he has excellent granulation noted. Overall I'm pleased with how things have progressed in that regard. A glance at his shoulder as well and the rash seems to be someone improving in my pinion at this site as well. Overall I am pleased with what we're seeing and so is the family. 02/20/18 on evaluation today patient appears to be doing a little worse in regard to the sacral wound only in the fact that there is redness surrounding it has me somewhat concerned for infection. The drainage has also apparently been a little bit off color compared to normal according to family they did keep the dressing today that was removed to show me and I agree this seems to be a little bit different compared to what we have been seeing. Coupled with the redness I'm concerned he may be developing some cellulitis surrounding the wound bed. 02/27/18 on evaluation today patient presents for follow-up concerning his sacral ulcer. We have received the results back from his wound culture which shows unfortunately that the doxycycline will not be of benefit for him I am going to need to initiate treatment with something else  in order to treat the pseudomonas. Otherwise he does not seem to be having any  significant pain although his daughter states there are sometimes when he states having pain. We continue to use the Vashe currently. 03/06/18 on evaluation today patient's sacral wound appears to be doing better in my opinion. He has been tolerating the dressing changes without complication. With that being said the silver nitrate has helped with the prominent area of hyper granulation at the 6 o'clock location we will likely need to repeat this again today. Nonetheless overall I am pleased with how things have improved over the last week. The erythema surrounding the wound seems to be greatly improved. 03/13/18 on evaluation today patient's wound actually does not appear to be terribly infected although she does continue to have erythema surrounding the wound bed especially on the left border. I'm still somewhat concerned about the fact that the oral antibiotics alone may not be completely treating his infection. I previously discussed may need to go for IV antibiotic therapy I'm concerned that may be the case. We will need to make a referral today for infectious disease. 03/20/18 on evaluation today the patient sacral wound actually appears to be doing fairly well in regard to granulation although he continues unfortunately to have it your theme is surrounding the periwound region. There's also some increased swelling at the 6 o'clock location which also has me somewhat concerned. With that being said he does have some discomfort but nothing too significant at this point. He still has not heard from infectious disease his daughter and wife are both present during the office visit today they're going to check back with this again. We did get the information for them to call them today. 03/27/18 on evaluation today patient is seen concerning his ongoing sacral ulcer. He has been tolerating the dressing changes without complication. With that being said he does present with evidence of bright green  drainage noted on the dressing which again is something that I do often expect to see with a pseudomonas infection. He continues to have your theme is [name], [name] ([id number]) surrounding the wound bed as well and again I'm not 100% convinced this is just pressure related. I did speak with Colletta Maryland who is the nurse practitioner in [place] with infectious disease. I spoke with her actually yesterday concerning this patient. She is not 100% convinced that this is infected. She question whether the wound may just be colonized with Pseudomonas and not actually causing an active infection. I am really not thinking that the edema is associated with pressure alone and again overall I don't feel that your theme and is consistent with a pressure injury either as he's never had any contusions noted like a deep tissue injury on his heel which was new and I did visualize today this was on the left heel. Nonetheless she wanted to give this a little bit more time and thought it would be appropriate to start the Wound VAC at this point. 04/03/18 on evaluation today patient sacral ulcer actually appears to be doing fairly well at this point. He has been tolerating the dressing changes without complication. With that being said I'm very pleased with the progress that has been made in regard to his sacral wound over the past week I do not see as much in the way of erythema which is great news. Nonetheless he does have a small area of hyper granulation unfortunately. This is at roughly the 7 o'clock location and I  think does need to be addressed so that this will heal more appropriately. Nonetheless I think we may be ready to go ahead and initiate therapy with the Wound VAC. 04/10/18 on evaluation today patient appears to be doing excellent in regard to his sacral ulcer. The show signs of great improvement in overall I'm very pleased with how things look. He has been tolerating the dressing changes  without complication. Specifically this is the Wound VAC. He also seems to be doing well with the antibiotic there is decreased your theme and redness surrounding the sacral area at this point in the wound has filled in quite significantly. 04/17/18 on evaluation today patient actually appears to be doing excellent in regard to his sacral ulcer. He's been tolerating the dressing changes without complication specifically the Wound VAC. There really are no major concerns from the patient nor family this point he is having no pain. He does have a little bit of Epiboly on the lateral portions of the wound where he does have a little bit more depth that will need to be addressed today. 04/23/18 on evaluation today patient's wound actually appears to be doing excellent at this point. He has been tolerating the Wound VAC and this appears to be doing well other than the fact that it seems to be breaking seal at the 6 o'clock location. I do believe that adding a duodenum dressing at this location try and help maintain the seal would be appropriate and likely very effective. With that being said he overall seems to be showing signs of good improvement at this point. There does not appear to be any evidence of significant infection which is also excellent news. 04/30/18 on evaluation today patient actually appears to be doing well in regard to his sacral ulcer. He's been tolerating the dressing changes without complication. Fortunately there does not appear to be any evidence of infection. Overall I'm very pleased with the progress that has been made up to this point. He does have some blistering underneath the draping unfortunately although this is definitely something that has been noted on other patients previously is a fairly common occurrence. Nonetheless the patient seems to be doing fairly well in general in my pinion based on what I see at this time. I do believe these are fairly superficial and  minor. 05/07/18 on evaluation today patient's wound actually appears to be doing excellent at this point in time. He has been tolerating the Wound VAC decently well he states that it is somewhat cumbersome to carry around unfortunately. The only other issue he's been having according to family is that they been having a difficult time keeping the Wound VAC in place and doing what is supposed to do without making. Obviously I do think that this is definitely of concern. Nonetheless I do believe she's made good progress up to this point. I'm very happy in that regard. 05/14/18 on evaluation today patient's wound continues to make good progress at this point. He had a minimal amount of slough noted on the surface which was easily wiped away with saline and gauze and in general I feel like that he is continuing to show excellent progress even with the discontinuation of the Wound VAC. Overall I'm pleased in this regard. He was having issues with the Wound VAC in getting it to seal I think that using the Prisma at this time has been equally efficient and getting the wound to diminish in size. 05/29/18- He is here in follow up  evaluation for a sacral ulcer. There is improvement, we will continue with prisma and he will follow up in two weeks 06/11/18 on evaluation today patient had unfortunately bright green drainage on the dressing upon evaluation today. This is something that we have encountered before although we were able to get things under control previously with antibiotics. With that being said currently upon further inspection of the three areas of hyper granulation that were separate from the actual wound itself which was almost healed it really appears that these all have some depth to them. They are more tunnels that really have not closed or at least have reopened as a result likely of infection in my pinion. This is definitely not what I was expecting or hoping for. [name], [name]  ([id number]) 06/26/18 on evaluation today patient continues to experience issues with what appears to be small abscesses in the sacral region unfortunately. With that being said he has been tolerating the dressing changes without complication which is good news. He's not having any significant discomfort which is also good news. With that being said his daughter states that after he left last week that the packing that we have placed fell out quite rapidly and he subsequently healed over very quick to the point they were not able to even repack the regions. Nonetheless there appear to be several fluctuance areas noted at this point there's one central region that does seem to be draining still discharge that is somewhat green in color. I did review the results of the wound culture which did show evidence of infection with both MRSA as well as pseudomonas based on that result. Nonetheless again with his other current medications we are not able to do the Cipro due to issues with potential long QT syndrome. I am going to give him a prescription for doxycycline in order to help with the potential MRSA infection. 07/09/18 on evaluation today patient actually appears to be doing about the same in regard to his sacral wound. He actually has his MRI scheduled for tomorrow and then subsequently is going to be having his infectious disease appointment for Thursday of this week. Fortunately he's not having any significant discomfort he still has your theme in the sacral region he still has several blister/flux went areas although they technically are not blisters this is more like a underlying abscess. The one area that is open still does probe down to bone. Again I am concerned about a deeper infection possibly even sacral osteomyelitis. 07/23/18 on evaluation today patient actually appears to be doing very well all things considered in regard to his sacral ulcer. Since I've last seen him he actually did have  his MRI performed which showed that he has a complex fluid collection superficial to the distal sacrum measuring 5.9 x 4.4 x 2.8 cm which abuts the posterior aspect of the distal sacrum and the sacrum itself shows cortical destruction consistent with osteomyelitis. She has also been seen by infectious disease and currently orders have been initiated for IV antibiotic therapy for the next eight weeks. He was seen by Janene Madeira NP and placed on Ceftazidime and Daptomycin. Currently he has not really been on this quite long enough to see a sufficient response to the new orders as far as antibiotic therapy is concerned. Nonetheless it does appear that he is likely on the right track at this point which is great news. Nonetheless the question which both Colletta Maryland and myself have discussed both with the patient and between  ourselves is whether or not the patient may need to be seen by surgeon for surgical evacuation of the fluid collection/abscess and possible debridement of the sacrum itself. Nonetheless at this time my personal opinion is probably gonna be that we wait and get this at least a couple weeks to see the response that he receives with the IV antibiotic therapy. 08/06/18 on evaluation today patient actually appears to be doing rather well in regard to his sacral ulcer region all things considered. He has been tolerating the dressing changes without complication. With that being said there is not any obvious opening nor any drainage noted at this point in time upon evaluation. The patient has been tolerating the dressing changes though. He's doing well with the IV antibiotic therapy. 08/20/18 on evaluation today patient appears to be doing wonderful in regard to his sacral region. In fact there appears to be no wound opening or drainage at this time. Overall I'm very pleased with how things have progressed. The patient likewise as well as his wife and daughter are very happy as  well. Readmission: 11/20/18 on evaluation today patient presents for reevaluation our clinic concerning issues with his sacral region. Unfortunately the area in question is the same region which we previously treated what we thought would successfully back in November 2019. At that time the patient underwent IV antibiotic therapy which seemed to do the job very well. Subsequently however in the past couple of weeks he has begun to have drainage and bleeding from the sacral region and is having increased pain yet again. Fortunately there is no signs of systemic infection although it does appear that the complex abscess that was previously noted may not have fully cleared there was a question between myself as well is infectious disease previous whether not he needed to see a surgeon being that things got better the family opted not to see a surgeon at that time. Nonetheless I am concerned that he may the need to see a surgeon in order to have this area surgically debrided and possibly a bone culture obtained in order to get a better idea of what we're treating and ensure that this is able to completely and fully heal. 11/27/18 on evaluation today patient appears to be doing about the same in regard to his sacral ulcer. He did see Dr. Lysle Pearl yesterday and the plan is to proceed forward with surgery to clean out the region. This seems to be the most appropriate goal of treatment at this point. Dr. Lysle Pearl just needed to speak with Dr. Ellene Route the patient's neurologist in order to get clearance as the patient was supposed to be scheduled for a carpal tunnel and elbow surgery with Dr. Ellene Route. Nonetheless he has apparently given clearance to proceed with this surgery for the sacral region which is deemed more important at this point. Unfortunately the patient had a little bit of increased pain although he is having redness at this point there does not appear to be any spreading infection which is good news. No  fevers, chills, nausea, or vomiting noted at this time. [name], [name] ([id number]) 12/03/18 on evaluation today patient actually appears to be doing about the same in regard to the sacral ulcer. He still waiting to hear back from Dr. Ines Bloomer office in regard to scheduling his surgery. Fortunately there's no signs of systemic infection at this time which is good news. Unfortunately he really does not seem to making any progress the doxycycline does seem to at least  be beneficial in helping to hold off the infection from worsening although unfortunately he still has a lot going on in this regard. Patient's daughter states she's actually gonna contact her office tomorrow to see what exactly is going on. 12/10/18 on evaluation today patient actually appears to be doing about the same in regard to her sacral room. Apparently he still waiting on approval from both cardiology as well as Dr. Ellene Route although apparently he's Artie gotten a formal letter from Dr. Ellene Route stated that he was clear to proceed with the sacral surgery. Di Kindle actually found the clearance letter from cardiology in epic as well today and this subsequently was given to the patient's daughter as that seems to be the only thing that was holding up proceeding with the surgery. Hopefully should be able to take that over to the surgeon's office today in order to go ahead and get this scheduled. Electronic Signature(s) Signed: 12/11/2018 8:52:26 AM By: Worthy Keeler PA-C Entered By: Worthy Keeler on 12/10/2018 23:01:44 [name] ([id number]) -------------------------------------------------------------------------------- Physical Exam Details Patient Name: [name], TAMMEN Date of Service: 12/10/2018 1:15 PM Medical Record Number: [id number] Patient Account Number: [id number] Date of Birth/Sex: 1931/07/25 (85 y.o. M) Treating RN: Harold Barban Primary Care Provider: Burman Freestone Other Clinician: Referring Provider:  Burman Freestone Treating Provider/Extender: STONE III, Dionicia Cerritos Weeks in Treatment: 2 Constitutional Well-nourished and well-hydrated in no acute distress. Respiratory normal breathing without difficulty. clear to auscultation bilaterally. Cardiovascular regular rate and rhythm with normal S1, S2. Psychiatric this patient is able to make decisions and demonstrates good insight into disease process. Alert and Oriented x 3. pleasant and cooperative. Notes Patient's wound bed really did not show any significant shift it was not quite as intense as today as it was previous but nonetheless he still showing signs of erythema. Fortunately there's no evidence of infection. Electronic Signature(s) Signed: 12/11/2018 8:52:26 AM By: Worthy Keeler PA-C Entered By: Worthy Keeler on 12/10/2018 23:02:27 [name] ([id number]) -------------------------------------------------------------------------------- Physician Orders Details Patient Name: GENE, GLAZEBROOK Date of Service: 12/10/2018 1:15 PM Medical Record Number: [id number] Patient Account Number: [id number] Date of Birth/Sex: Oct 12, 1931 (85 y.o. M) Treating RN: Harold Barban Primary Care Provider: Burman Freestone Other Clinician: Referring Provider: Burman Freestone Treating Provider/Extender: [name], Arnaldo Heffron Weeks in Treatment: 2 Verbal / Phone Orders: No Diagnosis Coding ICD-10 Coding Code Description L89.154 Pressure ulcer of sacral region, stage 4 L02.31 Cutaneous abscess of buttock Wound Cleansing Wound #2 Midline Sacrum o Clean wound with Normal Saline. o May Shower, gently pat wound dry prior to applying new dressing. Anesthetic (add to Medication List) Wound #2 Midline Sacrum o Topical Lidocaine 4% cream applied to wound bed prior to debridement (In Clinic Only). Primary Wound Dressing Wound #2 Midline Sacrum o Silver Alginate - Apply a zinc based product ( [place]) around the wound Secondary  Dressing Wound #2 Midline Sacrum o Boardered Foam Dressing Dressing Change Frequency Wound #2 Midline Sacrum o Change dressing every other day. Follow-up Appointments Wound #2 Midline Sacrum o Return Appointment in 1 week. Off-Loading Wound #2 Midline Sacrum o Turn and reposition every 2 hours Medications-please add to medication list. Wound #2 Midline Sacrum o P.O. Antibiotics - Finish Doxycycline Antibiotic Electronic Signature(s) [name], [name] ([id number]) Signed: 12/11/2018 8:52:26 AM By: Worthy Keeler PA-C Signed: 12/11/2018 11:13:25 AM By: Harold Barban Entered By: Harold Barban on 12/10/2018 14:24:32 [name] ([id number]) -------------------------------------------------------------------------------- Problem List Details Patient Name: MACLEAN, FOISTER. Date of Service: 12/10/2018 1:15  PM Medical Record Number: [id number] Patient Account Number: [id number] Date of Birth/Sex: 24-Mar-1931 (85 y.o. M) Treating RN: Harold Barban Primary Care Provider: Burman Freestone Other Clinician: Referring Provider: Burman Freestone Treating Provider/Extender: [name], Earlyne Feeser Weeks in Treatment: 2 Active Problems ICD-10 Evaluated Encounter Code Description Active Date Today Diagnosis L89.154 Pressure ulcer of sacral region, stage 4 11/20/2018 No Yes L02.31 Cutaneous abscess of buttock 11/20/2018 No Yes Inactive Problems Resolved Problems Electronic Signature(s) Signed: 12/11/2018 8:52:26 AM By: Worthy Keeler PA-C Entered By: Worthy Keeler on 12/10/2018 14:10:29 [name] ([id number]) -------------------------------------------------------------------------------- Progress Note Details Patient Name: [name] Date of Service: 12/10/2018 1:15 PM Medical Record Number: [id number] Patient Account Number: [id number] Date of Birth/Sex: 1931-07-07 (85 y.o. M) Treating RN: Harold Barban Primary Care Provider: Burman Freestone Other  Clinician: Referring Provider: Burman Freestone Treating Provider/Extender: [name], Piero Mustard Weeks in Treatment: 2 Subjective Chief Complaint Information obtained from Patient Sacral pressure ulcer History of Present Illness (HPI) 02/06/18 on evaluation today patient presents for initial evaluation and our clinic concerning an issue which began roughly 3 weeks ago when the patient fell in his home on the floor in his kitchen and laid him down this detergent for roughly 3 days. He had a pressure injury to the left shoulder. This unfortunately has caused him a lot of discomfort although it finally seems to be doing better if anything is really having a lot of itching right now. This appears potentially be a contact dermatitis issue. He also has a significant pressure injury to the sacrum at this time as well which is also showing fascia exposure right over the bone but no evidence of bone exposure at this point which is good news. They have been using Santyl as well as Saline soaked gauze at this point in time. There does appear to be a lot of necrotic slough in the base of the wound. He does have a history of incontinence, myocardial infarction, and hypertension. He also is "borderline diabetic" hemoglobin A1c of 6.0. Currently he has some discomfort in the pressure site at the sacrum but fortunately nothing too significant this did require sharp debridement today. 02/13/18 on evaluation today patient appears to be doing much better in regard to his sacral wound. He has been tolerating the dressing changes without complication with the Vashe. Fortunately there is no evidence of infection and though there is some Slough on the surface of the wound bed he has excellent granulation noted. Overall I'm pleased with how things have progressed in that regard. A glance at his shoulder as well and the rash seems to be someone improving in my pinion at this site as well. Overall I am pleased with what we're  seeing and so is the family. 02/20/18 on evaluation today patient appears to be doing a little worse in regard to the sacral wound only in the fact that there is redness surrounding it has me somewhat concerned for infection. The drainage has also apparently been a little bit off color compared to normal according to family they did keep the dressing today that was removed to show me and I agree this seems to be a little bit different compared to what we have been seeing. Coupled with the redness I'm concerned he may be developing some cellulitis surrounding the wound bed. 02/27/18 on evaluation today patient presents for follow-up concerning his sacral ulcer. We have received the results back from his wound culture which shows unfortunately that the doxycycline will not be of  benefit for him I am going to need to initiate treatment with something else in order to treat the pseudomonas. Otherwise he does not seem to be having any significant pain although his daughter states there are sometimes when he states having pain. We continue to use the Vashe currently. 03/06/18 on evaluation today patient's sacral wound appears to be doing better in my opinion. He has been tolerating the dressing changes without complication. With that being said the silver nitrate has helped with the prominent area of hyper granulation at the 6 o'clock location we will likely need to repeat this again today. Nonetheless overall I am pleased with how things have improved over the last week. The erythema surrounding the wound seems to be greatly improved. 03/13/18 on evaluation today patient's wound actually does not appear to be terribly infected although she does continue to have erythema surrounding the wound bed especially on the left border. I'm still somewhat concerned about the fact that the oral antibiotics alone may not be completely treating his infection. I previously discussed may need to go for IV antibiotic therapy  I'm concerned that may be the case. We will need to make a referral today for infectious disease. 03/20/18 on evaluation today the patient sacral wound actually appears to be doing fairly well in regard to granulation although he continues unfortunately to have it your theme is surrounding the periwound region. There's also some increased swelling at the 6 o'clock location which also has me somewhat concerned. With that being said he does have some discomfort but [name], [name]. ([id number]) nothing too significant at this point. He still has not heard from infectious disease his daughter and wife are both present during the office visit today they're going to check back with this again. We did get the information for them to call them today. 03/27/18 on evaluation today patient is seen concerning his ongoing sacral ulcer. He has been tolerating the dressing changes without complication. With that being said he does present with evidence of bright green drainage noted on the dressing which again is something that I do often expect to see with a pseudomonas infection. He continues to have your theme is surrounding the wound bed as well and again I'm not 100% convinced this is just pressure related. I did speak with Colletta Maryland who is the nurse practitioner in [place] with infectious disease. I spoke with her actually yesterday concerning this patient. She is not 100% convinced that this is infected. She question whether the wound may just be colonized with Pseudomonas and not actually causing an active infection. I am really not thinking that the edema is associated with pressure alone and again overall I don't feel that your theme and is consistent with a pressure injury either as he's never had any contusions noted like a deep tissue injury on his heel which was new and I did visualize today this was on the left heel. Nonetheless she wanted to give this a little bit more time and thought it would be  appropriate to start the Wound VAC at this point. 04/03/18 on evaluation today patient sacral ulcer actually appears to be doing fairly well at this point. He has been tolerating the dressing changes without complication. With that being said I'm very pleased with the progress that has been made in regard to his sacral wound over the past week I do not see as much in the way of erythema which is great news. Nonetheless he does have a small area  of hyper granulation unfortunately. This is at roughly the 7 o'clock location and I think does need to be addressed so that this will heal more appropriately. Nonetheless I think we may be ready to go ahead and initiate therapy with the Wound VAC. 04/10/18 on evaluation today patient appears to be doing excellent in regard to his sacral ulcer. The show signs of great improvement in overall I'm very pleased with how things look. He has been tolerating the dressing changes without complication. Specifically this is the Wound VAC. He also seems to be doing well with the antibiotic there is decreased your theme and redness surrounding the sacral area at this point in the wound has filled in quite significantly. 04/17/18 on evaluation today patient actually appears to be doing excellent in regard to his sacral ulcer. He's been tolerating the dressing changes without complication specifically the Wound VAC. There really are no major concerns from the patient nor family this point he is having no pain. He does have a little bit of Epiboly on the lateral portions of the wound where he does have a little bit more depth that will need to be addressed today. 04/23/18 on evaluation today patient's wound actually appears to be doing excellent at this point. He has been tolerating the Wound VAC and this appears to be doing well other than the fact that it seems to be breaking seal at the 6 o'clock location. I do believe that adding a duodenum dressing at this location try and  help maintain the seal would be appropriate and likely very effective. With that being said he overall seems to be showing signs of good improvement at this point. There does not appear to be any evidence of significant infection which is also excellent news. 04/30/18 on evaluation today patient actually appears to be doing well in regard to his sacral ulcer. He's been tolerating the dressing changes without complication. Fortunately there does not appear to be any evidence of infection. Overall I'm very pleased with the progress that has been made up to this point. He does have some blistering underneath the draping unfortunately although this is definitely something that has been noted on other patients previously is a fairly common occurrence. Nonetheless the patient seems to be doing fairly well in general in my pinion based on what I see at this time. I do believe these are fairly superficial and minor. 05/07/18 on evaluation today patient's wound actually appears to be doing excellent at this point in time. He has been tolerating the Wound VAC decently well he states that it is somewhat cumbersome to carry around unfortunately. The only other issue he's been having according to family is that they been having a difficult time keeping the Wound VAC in place and doing what is supposed to do without making. Obviously I do think that this is definitely of concern. Nonetheless I do believe she's made good progress up to this point. I'm very happy in that regard. 05/14/18 on evaluation today patient's wound continues to make good progress at this point. He had a minimal amount of slough noted on the surface which was easily wiped away with saline and gauze and in general I feel like that he is continuing to show excellent progress even with the discontinuation of the Wound VAC. Overall I'm pleased in this regard. He was having issues with the Wound VAC in getting it to seal I think that using the  Prisma at this time has been equally efficient and  getting the wound to diminish in size. 05/29/18- He is here in follow up evaluation for a sacral ulcer. There is improvement, we will continue with prisma and he will follow up in two weeks [name], [name] ([id number]) 06/11/18 on evaluation today patient had unfortunately bright green drainage on the dressing upon evaluation today. This is something that we have encountered before although we were able to get things under control previously with antibiotics. With that being said currently upon further inspection of the three areas of hyper granulation that were separate from the actual wound itself which was almost healed it really appears that these all have some depth to them. They are more tunnels that really have not closed or at least have reopened as a result likely of infection in my pinion. This is definitely not what I was expecting or hoping for. 06/26/18 on evaluation today patient continues to experience issues with what appears to be small abscesses in the sacral region unfortunately. With that being said he has been tolerating the dressing changes without complication which is good news. He's not having any significant discomfort which is also good news. With that being said his daughter states that after he left last week that the packing that we have placed fell out quite rapidly and he subsequently healed over very quick to the point they were not able to even repack the regions. Nonetheless there appear to be several fluctuance areas noted at this point there's one central region that does seem to be draining still discharge that is somewhat green in color. I did review the results of the wound culture which did show evidence of infection with both MRSA as well as pseudomonas based on that result. Nonetheless again with his other current medications we are not able to do the Cipro due to issues with potential long QT syndrome. I  am going to give him a prescription for doxycycline in order to help with the potential MRSA infection. 07/09/18 on evaluation today patient actually appears to be doing about the same in regard to his sacral wound. He actually has his MRI scheduled for tomorrow and then subsequently is going to be having his infectious disease appointment for Thursday of this week. Fortunately he's not having any significant discomfort he still has your theme in the sacral region he still has several blister/flux went areas although they technically are not blisters this is more like a underlying abscess. The one area that is open still does probe down to bone. Again I am concerned about a deeper infection possibly even sacral osteomyelitis. 07/23/18 on evaluation today patient actually appears to be doing very well all things considered in regard to his sacral ulcer. Since I've last seen him he actually did have his MRI performed which showed that he has a complex fluid collection superficial to the distal sacrum measuring 5.9 x 4.4 x 2.8 cm which abuts the posterior aspect of the distal sacrum and the sacrum itself shows cortical destruction consistent with osteomyelitis. She has also been seen by infectious disease and currently orders have been initiated for IV antibiotic therapy for the next eight weeks. He was seen by Janene Madeira NP and placed on Ceftazidime and Daptomycin. Currently he has not really been on this quite long enough to see a sufficient response to the new orders as far as antibiotic therapy is concerned. Nonetheless it does appear that he is likely on the right track at this point which is great news. Nonetheless the question  which both Colletta Maryland and myself have discussed both with the patient and between ourselves is whether or not the patient may need to be seen by surgeon for surgical evacuation of the fluid collection/abscess and possible debridement of the sacrum itself. Nonetheless at  this time my personal opinion is probably gonna be that we wait and get this at least a couple weeks to see the response that he receives with the IV antibiotic therapy. 08/06/18 on evaluation today patient actually appears to be doing rather well in regard to his sacral ulcer region all things considered. He has been tolerating the dressing changes without complication. With that being said there is not any obvious opening nor any drainage noted at this point in time upon evaluation. The patient has been tolerating the dressing changes though. He's doing well with the IV antibiotic therapy. 08/20/18 on evaluation today patient appears to be doing wonderful in regard to his sacral region. In fact there appears to be no wound opening or drainage at this time. Overall I'm very pleased with how things have progressed. The patient likewise as well as his wife and daughter are very happy as well. Readmission: 11/20/18 on evaluation today patient presents for reevaluation our clinic concerning issues with his sacral region. Unfortunately the area in question is the same region which we previously treated what we thought would successfully back in November 2019. At that time the patient underwent IV antibiotic therapy which seemed to do the job very well. Subsequently however in the past couple of weeks he has begun to have drainage and bleeding from the sacral region and is having increased pain yet again. Fortunately there is no signs of systemic infection although it does appear that the complex abscess that was previously noted may not have fully cleared there was a question between myself as well is infectious disease previous whether not he needed to see a surgeon being that things got better the family opted not to see a surgeon at that time. Nonetheless I am concerned that he may the need to see a surgeon in order to have this area surgically debrided and possibly a bone culture obtained in order to  get a better idea of what we're treating and ensure that this is able to completely and fully heal. 11/27/18 on evaluation today patient appears to be doing about the same in regard to his sacral ulcer. He did see Dr. Viviano Simas, [name] ([id number]) yesterday and the plan is to proceed forward with surgery to clean out the region. This seems to be the most appropriate goal of treatment at this point. Dr. Lysle Pearl just needed to speak with Dr. Ellene Route the patient's neurologist in order to get clearance as the patient was supposed to be scheduled for a carpal tunnel and elbow surgery with Dr. Ellene Route. Nonetheless he has apparently given clearance to proceed with this surgery for the sacral region which is deemed more important at this point. Unfortunately the patient had a little bit of increased pain although he is having redness at this point there does not appear to be any spreading infection which is good news. No fevers, chills, nausea, or vomiting noted at this time. 12/03/18 on evaluation today patient actually appears to be doing about the same in regard to the sacral ulcer. He still waiting to hear back from Dr. Ines Bloomer office in regard to scheduling his surgery. Fortunately there's no signs of systemic infection at this time which is good news. Unfortunately he really does  not seem to making any progress the doxycycline does seem to at least be beneficial in helping to hold off the infection from worsening although unfortunately he still has a lot going on in this regard. Patient's daughter states she's actually gonna contact her office tomorrow to see what exactly is going on. 12/10/18 on evaluation today patient actually appears to be doing about the same in regard to her sacral room. Apparently he still waiting on approval from both cardiology as well as Dr. Ellene Route although apparently he's Artie gotten a formal letter from Dr. Ellene Route stated that he was clear to proceed with the sacral  surgery. Di Kindle actually found the clearance letter from cardiology in epic as well today and this subsequently was given to the patient's daughter as that seems to be the only thing that was holding up proceeding with the surgery. Hopefully should be able to take that over to the surgeon's office today in order to go ahead and get this scheduled. Patient History Information obtained from Patient. Family History Cancer - Paternal Grandparents, Diabetes - Father, Heart Disease - Mother,Father, Stroke - Father, No family history of Hypertension, Kidney Disease, Lung Disease, Seizures, Thyroid Problems, Tuberculosis. Social History Never smoker, Marital Status - Widowed, Alcohol Use - Never, Drug Use - No History, Caffeine Use - Daily. Medical History Eyes Patient has history of Cataracts - bilateral removal Denies history of Glaucoma, Optic Neuritis Ear/Nose/Mouth/Throat Denies history of Chronic sinus problems/congestion, Middle ear problems Hematologic/Lymphatic Denies history of Anemia, Hemophilia, Human Immunodeficiency Virus, Lymphedema, Sickle Cell Disease Respiratory Patient has history of Asthma Denies history of Aspiration, Chronic Obstructive Pulmonary Disease (COPD), Pneumothorax, Sleep Apnea, Tuberculosis Cardiovascular Patient has history of Angina, Arrhythmia, Coronary Artery Disease, Hypertension, Myocardial Infarction - 2001 Denies history of Congestive Heart Failure, Deep Vein Thrombosis, Hypotension, Peripheral Arterial Disease, Peripheral Venous Disease, Phlebitis, Vasculitis Gastrointestinal Denies history of Cirrhosis , Colitis, Crohn s, Hepatitis A, Hepatitis B, Hepatitis C Endocrine Denies history of Type I Diabetes, Type II Diabetes Genitourinary Denies history of End Stage Renal Disease Immunological Denies history of Lupus Erythematosus, Raynaud s, Scleroderma Integumentary (Skin) Denies history of History of Burn, History of pressure  wounds Musculoskeletal Patient has history of Osteoarthritis Denies history of Gout, Rheumatoid Arthritis, Osteomyelitis Neurologic [name], [name] ([id number]) Patient has history of Dementia, Neuropathy Denies history of Quadriplegia, Paraplegia, Seizure Disorder Oncologic Denies history of Received Chemotherapy, Received Radiation Psychiatric Denies history of Anorexia/bulimia, Confinement Anxiety Hospitalization/Surgery History - 01/20/2018, ARMS, Fall. 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 Respiratory The patient has no complaints or symptoms. Cardiovascular The patient has no complaints or symptoms. Psychiatric The patient has no complaints or symptoms. Objective Constitutional Well-nourished and well-hydrated in no acute distress. Vitals Time Taken: 1:37 PM, Height: 69 in, Weight: 170.6 lbs, BMI: 25.2, Temperature: 98.3 F, Pulse: 57 bpm, Respiratory Rate: 16 breaths/min, Blood Pressure: 127/70 mmHg. Respiratory normal breathing without difficulty. clear to auscultation bilaterally. Cardiovascular regular rate and rhythm with normal S1, S2. Psychiatric this patient is able to make decisions and demonstrates good insight into disease process. Alert and Oriented x 3. pleasant and cooperative. General Notes: Patient's wound bed really did not show any significant shift it was not quite as intense as today as it was previous but nonetheless he still showing signs of erythema. Fortunately there's no evidence of infection. Integumentary (Hair, Skin) Wound #2 status is Open. Original cause of  wound was Bump. The wound is located on the Midline Sacrum. The wound measures 2.4cm length x 0.5cm width x 2.3cm depth; 0.942cm^2 area and 2.168cm^3 volume. There is Fat [name], [name] ([id number]) (Subcutaneous Tissue)  Exposed exposed. There is no tunneling or undermining noted. There is a medium amount of serous drainage noted. The wound margin is indistinct and nonvisible. There is small (1-33%) red granulation within the wound bed. There is no necrotic tissue within the wound bed. The periwound skin appearance exhibited: Erythema. The periwound skin appearance did not exhibit: Callus, Crepitus, Excoriation, Induration, Rash, Scarring, Dry/Scaly, Maceration, Atrophie Blanche, Cyanosis, Ecchymosis, Hemosiderin Staining, Mottled, Pallor, Rubor. The surrounding wound skin color is noted with erythema which is circumferential. Periwound temperature was noted as No Abnormality. The periwound has tenderness on palpation. Assessment Active Problems ICD-10 Pressure ulcer of sacral region, stage 4 Cutaneous abscess of buttock Plan Wound Cleansing: Wound #2 Midline Sacrum: Clean wound with Normal Saline. May Shower, gently pat wound dry prior to applying new dressing. Anesthetic (add to Medication List): Wound #2 Midline Sacrum: Topical Lidocaine 4% cream applied to wound bed prior to debridement (In Clinic Only). Primary Wound Dressing: Wound #2 Midline Sacrum: Silver Alginate - Apply a zinc based product ( [place]) around the wound Secondary Dressing: Wound #2 Midline Sacrum: Boardered Foam Dressing Dressing Change Frequency: Wound #2 Midline Sacrum: Change dressing every other day. Follow-up Appointments: Wound #2 Midline Sacrum: Return Appointment in 1 week. Off-Loading: Wound #2 Midline Sacrum: Turn and reposition every 2 hours Medications-please add to medication list.: Wound #2 Midline Sacrum: P.O. Antibiotics - Finish Doxycycline Antibiotic My suggestion at this point is gonna be that we go ahead and initiate the above wound care measures for the next week and the patient is in agreement with plan. We will subsequently see were things stand at follow-up. If anything changes or [name], [name] ([id number]) worsens the meantime he will let me know hopefully will get the surgery scheduled before then but if not again will see him at that point. Please see above for specific wound care orders. We will see patient for re-evaluation in 1 week(s) here in the clinic. If anything worsens or changes patient will contact our office for additional recommendations. Electronic Signature(s) Signed: 12/11/2018 8:52:26 AM By: Worthy Keeler PA-C Entered By: Worthy Keeler on 12/10/2018 23:02:41 [name], [name] ([id number]) -------------------------------------------------------------------------------- ROS/PFSH Details Patient Name: [name] Date of Service: 12/10/2018 1:15 PM Medical Record Number: [id number] Patient Account Number: [id number] Date of Birth/Sex: 1931/05/01 (85 y.o. M) Treating RN: Harold Barban Primary Care Provider: Burman Freestone Other Clinician: Referring Provider: Burman Freestone Treating Provider/Extender: [name], Bess Saltzman Weeks in Treatment: 2 Information Obtained From Patient Wound History Do you currently have one or more open woundso Yes How many open wounds do you currently haveo 1 Approximately how long have you had your woundso 2 or 3 weeks How have you been treating your wound(s) until nowo bordered foam dressing Has your wound(s) ever healed and then re-openedo Yes Have you had any lab work done in the past montho No Have you tested positive for an antibiotic resistant organism (MRSA, VRE)o Yes Have you tested positive for osteomyelitis (bone infection)o No Have you had any tests for circulation on your legso No Constitutional Symptoms (General Health) Complaints and Symptoms: Negative for: Fever; Chills Eyes Medical History: Positive for: Cataracts - bilateral removal Negative for: Glaucoma; Optic Neuritis Ear/Nose/Mouth/Throat Medical History: Negative for: Chronic sinus problems/congestion; Middle ear  problems  Hematologic/Lymphatic Medical History: Negative for: Anemia; Hemophilia; Human Immunodeficiency Virus; Lymphedema; Sickle Cell Disease Respiratory Complaints and Symptoms: No Complaints or Symptoms Medical History: Positive for: Asthma Negative for: Aspiration; Chronic Obstructive Pulmonary Disease (COPD); Pneumothorax; Sleep Apnea; Tuberculosis Cardiovascular Complaints and Symptoms: No Complaints or Symptoms Medical HistoryMarland Kitchen MUHAMMADALI, RIES ([id number]) Positive for: Angina; Arrhythmia; Coronary Artery Disease; Hypertension; Myocardial Infarction - 2001 Negative for: Congestive Heart Failure; Deep Vein Thrombosis; Hypotension; Peripheral Arterial Disease; Peripheral Venous Disease; Phlebitis; Vasculitis Gastrointestinal Medical History: Negative for: Cirrhosis ; Colitis; Crohnos; Hepatitis A; Hepatitis B; Hepatitis C Endocrine Medical History: Negative for: Type I Diabetes; Type II Diabetes Past Medical History Notes: Borderline Genitourinary Medical History: Negative for: End Stage Renal Disease Immunological Medical History: Negative for: Lupus Erythematosus; Raynaudos; Scleroderma Integumentary (Skin) Medical History: Negative for: History of Burn; History of pressure wounds Musculoskeletal Medical History: Positive for: Osteoarthritis Negative for: Gout; Rheumatoid Arthritis; Osteomyelitis Neurologic Medical History: Positive for: Dementia; Neuropathy Negative for: Quadriplegia; Paraplegia; Seizure Disorder Oncologic Medical History: Negative for: Received Chemotherapy; Received Radiation Past Medical History Notes: Melanoma on back Psychiatric Complaints and Symptoms: No Complaints or Symptoms Medical History: Negative for: Anorexia/bulimia; Confinement Anxiety HBO Extended History Items [name], [name] ([id number]) Eyes: Cataracts Immunizations Pneumococcal Vaccine: Received Pneumococcal Vaccination: Yes Implantable Devices No devices  added Hospitalization / Surgery History Name of Hospital Purpose of Hospitalization/Surgery Date [facility] Fall 01/20/2018 Family and Social History Cancer: Yes - Paternal Grandparents; Diabetes: Yes - Father; Heart Disease: Yes - Mother,Father; Hypertension: No; Kidney Disease: No; Lung Disease: No; Seizures: No; Stroke: Yes - Father; Thyroid Problems: No; Tuberculosis: No; Never smoker; Marital Status - Widowed; Alcohol Use: Never; Drug Use: No History; Caffeine Use: Daily; Financial Concerns: No; Food, Clothing or Shelter Needs: No; Support System Lacking: No; Transportation Concerns: No; Advanced Directives: Yes (Not Provided); Patient does not want information on Advanced Directives; Do not resuscitate: Yes (Not Provided); Living Will: Yes (Not Provided); Medical Power of Attorney: Yes - Dirk Vanaman (Not Provided) Physician Affirmation I have reviewed and agree with the above information. Electronic Signature(s) Signed: 12/11/2018 8:52:26 AM By: Worthy Keeler PA-C Signed: 12/11/2018 11:13:25 AM By: Harold Barban Entered By: Worthy Keeler on 12/10/2018 23:02:02 [name] ([id number]) -------------------------------------------------------------------------------- SuperBill Details Patient Name: [name] Date of Service: 12/10/2018 Medical Record Number: [id number] Patient Account Number: [id number] Date of Birth/Sex: November 01, 1930 (85 y.o. M) Treating RN: Harold Barban Primary Care Provider: Burman Freestone Other Clinician: Referring Provider: Burman Freestone Treating Provider/Extender: [name], Kamariah Fruchter Weeks in Treatment: 2 Diagnosis Coding ICD-10 Codes Code Description L89.154 Pressure ulcer of sacral region, stage 4 L02.31 Cutaneous abscess of buttock Facility Procedures CPT4 Code: [id number] Description: 99213 - WOUND CARE VISIT-LEV 3 EST PT Modifier: Quantity: 1 Physician Procedures CPT4 Code: [id number] Description: 90211 - WC PHYS LEVEL 4 - EST PT ICD-10  Diagnosis Description L89.154 Pressure ulcer of sacral region, stage 4 L02.31 Cutaneous abscess of buttock Modifier: Quantity: 1 Electronic Signature(s) Signed: 12/11/2018 8:52:26 AM By: Worthy Keeler PA-C Entered By: Worthy Keeler on 12/10/2018 23:02:50

## 2018-12-13 ENCOUNTER — Inpatient Hospital Stay: Admission: RE | Admit: 2018-12-13 | Payer: Medicare Other | Source: Ambulatory Visit

## 2018-12-13 ENCOUNTER — Ambulatory Visit: Payer: Self-pay | Admitting: Surgery

## 2018-12-13 NOTE — H&P (View-Only) (Signed)
Subjective:   CC: Decubitus ulcer of sacral region, stage 1 [L89.151]  HPI:  [name] is a 85 y.o. male who was referred by [facility] for evaluation of above. Chronic sacral wound requiring extensive wound care and IV abx For concern of osteomyelitis.  Per the daughter the wound has was close to healing completely but patient started complaining of increased pain and discharge in the area starting about a month ago.  Currently the patient has the area covered with dressings that contain a foul smell and discharge.  Concern from the wound care center was that there is a underlying infection that never fully resolved therefore was referred to [place] for possible formal surgical debridement.     Past Medical History:  has a past medical history of Arrhythmia, Atrial fibrillation (CMS-HCC), GERD (gastroesophageal reflux disease), and Hypertension.  Past Surgical History:  has a past surgical history that includes back surgery.  Family History: reviewed and not relevant to CC  Social History:  reports that he has never smoked. He has never used smokeless tobacco. He reports that he does not drink alcohol or use drugs.  Current Medications: has a current medication list which includes the following prescription(s): amiodarone, amlodipine, apixaban, atorvastatin, cyanocobalamin, doxazosin, melatonin, naldemedine, pantoprazole, and tamsulosin.  Allergies:       Allergies  Allergen Reactions  . Budesonide-Formoterol Swelling    Mouth and tongue swelling.  . Iodinated Contrast Media Other (See Comments) and Shortness Of Breath    Iodine pt had a reaction when he had a CT DONE  . Iohexol Itching and Shortness Of Breath    Pt was given 100cc of Omnipaque 300 followed by itching/ dyspnea.  . Simvastatin Other (See Comments)    REACTION: unknown  . Meperidine Other (See Comments)    REACTION: Hallucinations REACTION: Hallucinations   . Adhesive Tape-Silicones Other (See Comments)     Tears skin off, Please use "paper" tape  . Naproxen Swelling  . Pregabalin Rash  . Sulfa (Sulfonamide Antibiotics) Rash    ROS:  A 15 point review of systems was performed and pertinent positives and negatives noted in HPI   Objective:   BP 133/74   Pulse 57   Ht 172.7 cm (5\' 8" )   Wt 78 kg (172 lb)   BMI 26.15 kg/m   Constitutional :  alert, appears stated age, cooperative and no distress  Lymphatics/Throat:  no asymmetry, masses, or scars  Respiratory:  clear to auscultation bilaterally  Cardiovascular:  irregularly irregular rhythm  Gastrointestinal: soft, non-tender; bowel sounds normal; no masses,  no organomegaly.    Musculoskeletal: Steady gait and movement  Skin: Cool and moist, Sacral region noted to have a healing sacral wound ulcer with excess skin tags noted at the midline.  In the middle of 1 of the bigger skin tags, there is a punctate lesion that is able to be probed with the tip of a CTA, which tunnels down to the sacral bone.  No obvious purulent discharge was noted during the exam except on the overlying dressing that was changed yesterday.  No other tunnels were openings were noted during the external exam.    Psychiatric: Normal affect, non-agitated, not confused       LABS:  n/a   RADS: n/a  Assessment:      Decubitus ulcer of sacral region, stage 1 [L89.151]  Afib on Eliquiis  Plan:   1. Decubitus ulcer of sacral region, stage 1 [L89.151] Due to the previous extensive history  of a chronic infection as well as the current drainage amount as well as tenderness in the area, I believe formal surgical exploration and possible further excision of the area is warranted at this time. Alternatives include continued observation.  Benefits include possible symptom relief, pathologic evaluation,   Discussed the risk of surgery including recurrence, chronic pain, post-op infxn, poor cosmesis, poor/delayed wound healing, and possible  re-operation to address said risks. The risks of general anesthetic, if used, includes MI, CVA, sudden death or even reaction to anesthetic medications also discussed.  Typical post-op recovery time of 3-5 days with possible activity restrictions were also discussed.  The patient verbalized understanding and all questions were answered to the patient's satisfaction.  2.afib- We will proceed with debridement in the operating room after obtaining clearance to stop Eliquis perioperatively.    Afterwards we will defer back to the wound care center for further long care treatment, as well as possible need for another infectious disease referral for antibiotics.  Plan is to obtain a bone biopsy during the operation at this time

## 2018-12-13 NOTE — Progress Notes (Signed)
AMISH, MINTZER ([id number]) Visit Report for 12/10/2018 Arrival Information Details Patient Name: [name], [name]. Date of Service: 12/10/2018 1:15 PM Medical Record Number: [id number] Patient Account Number: [id number] Date of Birth/Sex: June 16, 1931 (85 y.o. M) Treating RN: Secundino Ginger Primary Care Lesley Galentine: Burman Freestone Other Clinician: Referring Devita Nies: Burman Freestone Treating Manly Nestle/Extender: [name], HOYT Weeks in Treatment: 2 Visit Information History Since Last Visit Added or deleted any medications: No Patient Arrived: [place] Any new allergies or adverse reactions: No Arrival Time: 13:29 Had a fall or experienced change in No Accompanied By: family activities of daily living that may affect Transfer Assistance: None risk of falls: Patient Identification Verified: Yes Signs or symptoms of abuse/neglect since last visito No Secondary Verification Process Yes Hospitalized since last visit: No Completed: Implantable device outside of the clinic excluding No Patient Has Alerts: Yes cellular tissue based products placed in the center Patient Alerts: Patient on Blood since last visit: Thinner Has Dressing in Place as Prescribed: Yes Eliquis Pain Present Now: No Electronic Signature(s) Signed: 12/10/2018 4:13:49 PM By: Secundino Ginger Entered By: Secundino Ginger on 12/10/2018 13:33:32 [name] ([id number]) -------------------------------------------------------------------------------- Clinic Level of Care Assessment Details Patient Name: [name] Date of Service: 12/10/2018 1:15 PM Medical Record Number: [id number] Patient Account Number: [id number] Date of Birth/Sex: 1931/04/03 (85 y.o. M) Treating RN: Harold Barban Primary Care Gesselle Fitzsimons: Burman Freestone Other Clinician: Referring Taje Tondreau: Burman Freestone Treating Thursa Emme/Extender: [name], HOYT Weeks in Treatment: 2 Clinic Level of Care Assessment Items TOOL 4 Quantity Score []  - Use when only an  EandM is performed on FOLLOW-UP visit 0 ASSESSMENTS - Nursing Assessment / Reassessment X - Reassessment of Co-morbidities (includes updates in patient status) 1 10 X- 1 5 Reassessment of Adherence to Treatment Plan ASSESSMENTS - Wound and Skin Assessment / Reassessment X - Simple Wound Assessment / Reassessment - one wound 1 5 []  - 0 Complex Wound Assessment / Reassessment - multiple wounds []  - 0 Dermatologic / Skin Assessment (not related to wound area) ASSESSMENTS - Focused Assessment []  - Circumferential Edema Measurements - multi extremities 0 []  - 0 Nutritional Assessment / Counseling / Intervention []  - 0 Lower Extremity Assessment (monofilament, tuning fork, pulses) []  - 0 Peripheral Arterial Disease Assessment (using hand held doppler) ASSESSMENTS - Ostomy and/or Continence Assessment and Care []  - Incontinence Assessment and Management 0 []  - 0 Ostomy Care Assessment and Management (repouching, etc.) PROCESS - Coordination of Care X - Simple Patient / Family Education for ongoing care 1 15 []  - 0 Complex (extensive) Patient / Family Education for ongoing care X- 1 10 Staff obtains Programmer, systems, Records, Test Results / Process Orders []  - 0 Staff telephones HHA, Nursing Homes / Clarify orders / etc []  - 0 Routine Transfer to another Facility (non-emergent condition) []  - 0 Routine Hospital Admission (non-emergent condition) []  - 0 New Admissions / Biomedical engineer / Ordering NPWT, Apligraf, etc. []  - 0 Emergency Hospital Admission (emergent condition) X- 1 10 Simple Discharge Coordination KEEFER, SOULLIERE ([id number]) []  - 0 Complex (extensive) Discharge Coordination PROCESS - Special Needs []  - Pediatric / Minor Patient Management 0 []  - 0 Isolation Patient Management []  - 0 Hearing / Language / Visual special needs []  - 0 Assessment of Community assistance (transportation, D/C planning, etc.) []  - 0 Additional assistance / Altered mentation []  -  0 Support Surface(s) Assessment (bed, cushion, seat, etc.) INTERVENTIONS - Wound Cleansing / Measurement X - Simple Wound Cleansing - one wound 1 5 []  - 0  Complex Wound Cleansing - multiple wounds X- 1 5 Wound Imaging (photographs - any number of wounds) []  - 0 Wound Tracing (instead of photographs) X- 1 5 Simple Wound Measurement - one wound []  - 0 Complex Wound Measurement - multiple wounds INTERVENTIONS - Wound Dressings X - Small Wound Dressing one or multiple wounds 1 10 []  - 0 Medium Wound Dressing one or multiple wounds []  - 0 Large Wound Dressing one or multiple wounds []  - 0 Application of Medications - topical []  - 0 Application of Medications - injection INTERVENTIONS - Miscellaneous []  - External ear exam 0 []  - 0 Specimen Collection (cultures, biopsies, blood, body fluids, etc.) []  - 0 Specimen(s) / Culture(s) sent or taken to Lab for analysis []  - 0 Patient Transfer (multiple staff / Civil Service fast streamer / Similar devices) []  - 0 Simple Staple / Suture removal (25 or less) []  - 0 Complex Staple / Suture removal (26 or more) []  - 0 Hypo / Hyperglycemic Management (close monitor of Blood Glucose) []  - 0 Ankle / Brachial Index (ABI) - do not check if billed separately X- 1 5 Vital Signs EIDAN, MUELLNER ([id number]) Has the patient been seen at the hospital within the last three years: Yes Total Score: 85 Level Of Care: New/Established - Level 3 Electronic Signature(s) Signed: 12/11/2018 11:13:25 AM By: Harold Barban Entered By: Harold Barban on 12/10/2018 14:18:06 [name] ([id number]) -------------------------------------------------------------------------------- Encounter Discharge Information Details Patient Name: [name] Date of Service: 12/10/2018 1:15 PM Medical Record Number: [id number] Patient Account Number: [id number] Date of Birth/Sex: Nov 13, 1930 (85 y.o. M) Treating RN: Army Melia Primary Care Gorgeous Newlun: Burman Freestone Other  Clinician: Referring Izan Miron: Burman Freestone Treating Mont Jagoda/Extender: [name], HOYT Weeks in Treatment: 2 Encounter Discharge Information Items Discharge Condition: Stable Ambulatory Status: Ambulatory Discharge Destination: Home Transportation: Private Auto Accompanied By: daughter and wife Schedule Follow-up Appointment: Yes Clinical Summary of Care: Electronic Signature(s) Signed: 12/12/2018 9:17:40 AM By: Army Melia Entered By: Army Melia on 12/10/2018 14:35:34 [name] ([id number]) -------------------------------------------------------------------------------- Lower Extremity Assessment Details Patient Name: [name] Date of Service: 12/10/2018 1:15 PM Medical Record Number: [id number] Patient Account Number: [id number] Date of Birth/Sex: 04/04/1931 (85 y.o. M) Treating RN: Secundino Ginger Primary Care Emanuela Runnion: Burman Freestone Other Clinician: Referring Tameisha Covell: Burman Freestone Treating Gaberiel Youngblood/Extender: [name], HOYT Weeks in Treatment: 2 Electronic Signature(s) Signed: 12/10/2018 4:13:49 PM By: Secundino Ginger Entered By: Secundino Ginger on 12/10/2018 13:39:25 [name] ([id number]) -------------------------------------------------------------------------------- Multi Wound Chart Details Patient Name: [name] Date of Service: 12/10/2018 1:15 PM Medical Record Number: [id number] Patient Account Number: [id number] Date of Birth/Sex: [birth year] (85 y.o. M) Treating RN: Harold Barban Primary Care Ricca Melgarejo: Burman Freestone Other Clinician: Referring Dmonte Maher: Burman Freestone Treating Kanai Hilger/Extender: [name], HOYT Weeks in Treatment: 2 Vital Signs Height(in): 69 Pulse(bpm): 87 Weight(lbs): 170.6 Blood Pressure(mmHg): 127/70 Body Mass Index(BMI): 25 Temperature(F): 98.3 Respiratory Rate 16 (breaths/min): Photos: [2:No Photos] [N/A:N/A] Wound Location: [2:Sacrum - Midline] [N/A:N/A] Wounding Event: [2:Bump] [N/A:N/A] Primary  Etiology: [2:Atypical] [N/A:N/A] Comorbid History: [2:Cataracts, Asthma, Angina, Arrhythmia, Coronary Artery Disease, Hypertension, Myocardial Infarction, Osteoarthritis, Dementia, Neuropathy] [N/A:N/A] Date Acquired: [2:10/30/2018] [N/A:N/A] Weeks of Treatment: [2:2] [N/A:N/A] Wound Status: [2:Open] [N/A:N/A] Measurements L x W x D [2:2.4x0.5x2.3] [N/A:N/A] (cm) Area (cm) : [2:0.942] [N/A:N/A] Volume (cm) : [2:2.168] [N/A:N/A] % Reduction in Area: [2:-11675.00%] [N/A:N/A] % Reduction in Volume: [2:-10740.00%] [N/A:N/A] Classification: [2:Full Thickness Without Exposed Support Structures] [N/A:N/A] Exudate Amount: [2:Medium] [N/A:N/A] Exudate Type: [2:Serous] [N/A:N/A] Exudate Color: [2:amber] [N/A:N/A] Wound Margin: [2:Indistinct,  nonvisible] [N/A:N/A] Granulation Amount: [2:Small (1-33%)] [N/A:N/A] Granulation Quality: [2:Red] [N/A:N/A] Necrotic Amount: [2:None Present (0%)] [N/A:N/A] Exposed Structures: [2:Fat Layer (Subcutaneous Tissue) Exposed: Yes Fascia: No Tendon: No Muscle: No Joint: No Bone: No] [N/A:N/A] Epithelialization: [2:Medium (34-66%)] [N/A:N/A] Periwound Skin Texture: [N/A:N/A] Excoriation: No Induration: No Callus: No Crepitus: No Rash: No Scarring: No Periwound Skin Moisture: Maceration: No N/A N/A Dry/Scaly: No Periwound Skin Color: Erythema: Yes N/A N/A Atrophie Blanche: No Cyanosis: No Ecchymosis: No Hemosiderin Staining: No Mottled: No Pallor: No Rubor: No Erythema Location: Circumferential N/A N/A Temperature: No Abnormality N/A N/A Tenderness on Palpation: Yes N/A N/A Wound Preparation: Ulcer Cleansing: N/A N/A Rinsed/Irrigated with Saline Topical Anesthetic Applied: Other: lidocaine 4% Treatment Notes Electronic Signature(s) Signed: 12/11/2018 11:13:25 AM By: Harold Barban Entered By: Harold Barban on 12/10/2018 14:13:59 [name]  ([id number]) -------------------------------------------------------------------------------- Clever Details Patient Name: AZAVION, BOUILLON Date of Service: 12/10/2018 1:15 PM Medical Record Number: [id number] Patient Account Number: [id number] Date of Birth/Sex: Jun 26, 1931 (85 y.o. M) Treating RN: Harold Barban Primary Care Haruo Stepanek: Burman Freestone Other Clinician: Referring Tamim Skog: Burman Freestone Treating Ellawyn Wogan/Extender: [name], HOYT Weeks in Treatment: 2 Active Inactive Abuse / Safety / Falls / Self Care Management Nursing Diagnoses: Potential for falls Goals: Patient will not experience any injury related to falls Date Initiated: 11/20/2018 Target Resolution Date: 02/15/2019 Goal Status: Active Interventions: Assess fall risk on admission and as needed Notes: Orientation to the Wound Care Program Nursing Diagnoses: Knowledge deficit related to the wound healing center program Goals: Patient/caregiver will verbalize understanding of the [facility] Date Initiated: 11/20/2018 Target Resolution Date: 02/15/2019 Goal Status: Active Interventions: Provide education on orientation to the wound center Notes: Wound/Skin Impairment Nursing Diagnoses: Impaired tissue integrity Goals: Ulcer/skin breakdown will heal within 14 weeks Date Initiated: 11/20/2018 Target Resolution Date: 02/15/2019 Goal Status: Active Interventions: Assess patient/caregiver ability to obtain necessary supplies MASSIMILIANO, ROHLEDER ([id number]) Assess patient/caregiver ability to perform ulcer/skin care regimen upon admission and as needed Assess ulceration(s) every visit Notes: Electronic Signature(s) Signed: 12/11/2018 11:13:25 AM By: Harold Barban Entered By: Harold Barban on 12/10/2018 14:13:48 [name] ([id number]) -------------------------------------------------------------------------------- Pain Assessment Details Patient Name: [name] Date of Service: 12/10/2018 1:15 PM Medical Record Number: [id number] Patient Account Number: [id number] Date of Birth/Sex: 1931-10-16 (85 y.o. M) Treating RN: Secundino Ginger Primary Care Rion Catala: Burman Freestone Other Clinician: Referring Nayah Lukens: Burman Freestone Treating Masae Lukacs/Extender: [name], HOYT Weeks in Treatment: 2 Active Problems Location of Pain Severity and Description of Pain Patient Has Paino Yes Site Locations Pain Location: Pain in Ulcers Rate the pain. Current Pain Level: 8 Pain Management and Medication Current Pain Management: Notes C/O pain to wound 8/10 . currently on hydrocodone with no relief. enc to see primary PRN. Electronic Signature(s) Signed: 12/10/2018 4:13:49 PM By: Secundino Ginger Entered By: Secundino Ginger on 12/10/2018 13:36:05 [name] ([id number]) -------------------------------------------------------------------------------- Patient/Caregiver Education Details Patient Name: [name] Date of Service: 12/10/2018 1:15 PM Medical Record Number: [id number] Patient Account Number: [id number] Date of Birth/Gender: [birth year] (85 y.o. M) Treating RN: Harold Barban Primary Care Physician: Burman Freestone Other Clinician: Referring Physician: Burman Freestone Treating Physician/Extender: Sharalyn Ink in Treatment: 2 Education Assessment Education Provided To: Patient Education Topics Provided Pressure: Handouts: Pressure Ulcers: Care and Offloading Methods: Demonstration, Explain/Verbal Responses: State content correctly Wound/Skin Impairment: Handouts: Caring for Your Ulcer Methods: Demonstration, Explain/Verbal Responses: State content correctly Electronic Signature(s) Signed: 12/11/2018 11:13:25 AM By: Harold Barban Entered By: Harold Barban on 12/10/2018  14:14:58 STORMY, SABOL ([id number]) -------------------------------------------------------------------------------- Wound Assessment Details Patient  Name: JADIAN, KARMAN. Date of Service: 12/10/2018 1:15 PM Medical Record Number: [id number] Patient Account Number: [id number] Date of Birth/Sex: [birth year] (85 y.o. M) Treating RN: Secundino Ginger Primary Care Lavergne Hiltunen: Burman Freestone Other Clinician: Referring Zamauri Nez: Burman Freestone Treating Leetta Hendriks/Extender: [name], HOYT Weeks in Treatment: 2 Wound Status Wound Number: 2 Primary Atypical Etiology: Wound Location: Sacrum - Midline Wound Open Wounding Event: Bump Status: Date Acquired: 10/30/2018 Comorbid Cataracts, Asthma, Angina, Arrhythmia, Weeks Of Treatment: 2 History: Coronary Artery Disease, Hypertension, Clustered Wound: No Myocardial Infarction, Osteoarthritis, Dementia, Neuropathy Photos Photo Uploaded By: Secundino Ginger on 12/10/2018 15:03:52 Wound Measurements Length: (cm) 2.4 Width: (cm) 0.5 Depth: (cm) 2.3 Area: (cm) 0.942 Volume: (cm) 2.168 % Reduction in Area: -11675% % Reduction in Volume: -10740% Epithelialization: Medium (34-66%) Tunneling: No Undermining: No Wound Description Full Thickness Without Exposed Support Foul Od Classification: Structures Slough/ Wound Margin: Indistinct, nonvisible Exudate Medium Amount: Exudate Type: Serous Exudate Color: amber or After Cleansing: No Fibrino No Wound Bed Granulation Amount: Small (1-33%) Exposed Structure Granulation Quality: Red Fascia Exposed: No Necrotic Amount: None Present (0%) Fat Layer (Subcutaneous Tissue) Exposed: Yes Tendon Exposed: No Muscle Exposed: No Joint Exposed: No DENG, KEMLER ([id number]) Bone Exposed: No Periwound Skin Texture Texture Color No Abnormalities Noted: No No Abnormalities Noted: No Callus: No Atrophie Blanche: No Crepitus: No Cyanosis: No Excoriation: No Ecchymosis: No Induration: No Erythema: Yes Rash: No Erythema Location: Circumferential Scarring: No Hemosiderin Staining: No Mottled: No Moisture Pallor: No No Abnormalities Noted:  No Rubor: No Dry / Scaly: No Maceration: No Temperature / Pain Temperature: No Abnormality Tenderness on Palpation: Yes Wound Preparation Ulcer Cleansing: Rinsed/Irrigated with Saline Topical Anesthetic Applied: Other: lidocaine 4%, Treatment Notes Wound #2 (Midline Sacrum) Notes desitin, silvercel, bordered foam dressing Electronic Signature(s) Signed: 12/10/2018 4:13:49 PM By: Secundino Ginger Entered By: Secundino Ginger on 12/10/2018 13:50:07 [name] ([id number]) -------------------------------------------------------------------------------- Vitals Details Patient Name: [name] Date of Service: 12/10/2018 1:15 PM Medical Record Number: [id number] Patient Account Number: [id number] Date of Birth/Sex: 13-May-1931 (85 y.o. M) Treating RN: Secundino Ginger Primary Care Leonna Schlee: Burman Freestone Other Clinician: Referring Tifani Dack: Burman Freestone Treating Jacquelyne Quarry/Extender: [name], HOYT Weeks in Treatment: 2 Vital Signs Time Taken: 13:37 Temperature (F): 98.3 Height (in): 69 Pulse (bpm): 57 Weight (lbs): 170.6 Respiratory Rate (breaths/min): 16 Body Mass Index (BMI): 25.2 Blood Pressure (mmHg): 127/70 Reference Range: 80 - 120 mg / dl Electronic Signature(s) Signed: 12/10/2018 4:13:49 PM By: Secundino Ginger Entered BySecundino Ginger on 12/10/2018 13:37:50

## 2018-12-13 NOTE — H&P (Signed)
Subjective:   CC: Decubitus ulcer of sacral region, stage 1 [L89.151]  HPI:  [name] is a 85 y.o. male who was referred by [facility] for evaluation of above. Chronic sacral wound requiring extensive wound care and IV abx For concern of osteomyelitis.  Per the daughter the wound has was close to healing completely but patient started complaining of increased pain and discharge in the area starting about a month ago.  Currently the patient has the area covered with dressings that contain a foul smell and discharge.  Concern from the wound care center was that there is a underlying infection that never fully resolved therefore was referred to [place] for possible formal surgical debridement.     Past Medical History:  has a past medical history of Arrhythmia, Atrial fibrillation (CMS-HCC), GERD (gastroesophageal reflux disease), and Hypertension.  Past Surgical History:  has a past surgical history that includes back surgery.  Family History: reviewed and not relevant to CC  Social History:  reports that he has never smoked. He has never used smokeless tobacco. He reports that he does not drink alcohol or use drugs.  Current Medications: has a current medication list which includes the following prescription(s): amiodarone, amlodipine, apixaban, atorvastatin, cyanocobalamin, doxazosin, melatonin, naldemedine, pantoprazole, and tamsulosin.  Allergies:       Allergies  Allergen Reactions  . Budesonide-Formoterol Swelling    Mouth and tongue swelling.  . Iodinated Contrast Media Other (See Comments) and Shortness Of Breath    Iodine pt had a reaction when he had a CT DONE  . Iohexol Itching and Shortness Of Breath    Pt was given 100cc of Omnipaque 300 followed by itching/ dyspnea.  . Simvastatin Other (See Comments)    REACTION: unknown  . Meperidine Other (See Comments)    REACTION: Hallucinations REACTION: Hallucinations   . Adhesive Tape-Silicones Other (See Comments)     Tears skin off, Please use "paper" tape  . Naproxen Swelling  . Pregabalin Rash  . Sulfa (Sulfonamide Antibiotics) Rash    ROS:  A 15 point review of systems was performed and pertinent positives and negatives noted in HPI   Objective:   BP 133/74   Pulse 57   Ht 172.7 cm (5\' 8" )   Wt 78 kg (172 lb)   BMI 26.15 kg/m   Constitutional :  alert, appears stated age, cooperative and no distress  Lymphatics/Throat:  no asymmetry, masses, or scars  Respiratory:  clear to auscultation bilaterally  Cardiovascular:  irregularly irregular rhythm  Gastrointestinal: soft, non-tender; bowel sounds normal; no masses,  no organomegaly.    Musculoskeletal: Steady gait and movement  Skin: Cool and moist, Sacral region noted to have a healing sacral wound ulcer with excess skin tags noted at the midline.  In the middle of 1 of the bigger skin tags, there is a punctate lesion that is able to be probed with the tip of a CTA, which tunnels down to the sacral bone.  No obvious purulent discharge was noted during the exam except on the overlying dressing that was changed yesterday.  No other tunnels were openings were noted during the external exam.    Psychiatric: Normal affect, non-agitated, not confused       LABS:  n/a   RADS: n/a  Assessment:      Decubitus ulcer of sacral region, stage 1 [L89.151]  Afib on Eliquiis  Plan:   1. Decubitus ulcer of sacral region, stage 1 [L89.151] Due to the previous extensive history  of a chronic infection as well as the current drainage amount as well as tenderness in the area, I believe formal surgical exploration and possible further excision of the area is warranted at this time. Alternatives include continued observation.  Benefits include possible symptom relief, pathologic evaluation,   Discussed the risk of surgery including recurrence, chronic pain, post-op infxn, poor cosmesis, poor/delayed wound healing, and possible  re-operation to address said risks. The risks of general anesthetic, if used, includes MI, CVA, sudden death or even reaction to anesthetic medications also discussed.  Typical post-op recovery time of 3-5 days with possible activity restrictions were also discussed.  The patient verbalized understanding and all questions were answered to the patient's satisfaction.  2.afib- We will proceed with debridement in the operating room after obtaining clearance to stop Eliquis perioperatively.    Afterwards we will defer back to the wound care center for further long care treatment, as well as possible need for another infectious disease referral for antibiotics.  Plan is to obtain a bone biopsy during the operation at this time

## 2018-12-14 ENCOUNTER — Ambulatory Visit (INDEPENDENT_AMBULATORY_CARE_PROVIDER_SITE_OTHER): Payer: Medicare Other | Admitting: Internal Medicine

## 2018-12-14 ENCOUNTER — Encounter: Admission: RE | Payer: Self-pay | Source: Home / Self Care

## 2018-12-14 ENCOUNTER — Encounter: Payer: Self-pay | Admitting: Internal Medicine

## 2018-12-14 ENCOUNTER — Ambulatory Visit: Admission: RE | Admit: 2018-12-14 | Payer: Medicare Other | Source: Home / Self Care | Admitting: Surgery

## 2018-12-14 VITALS — BP 144/68 | HR 56 | Ht 66.0 in | Wt 169.2 lb

## 2018-12-14 DIAGNOSIS — I10 Essential (primary) hypertension: Secondary | ICD-10-CM | POA: Diagnosis not present

## 2018-12-14 DIAGNOSIS — I2510 Atherosclerotic heart disease of native coronary artery without angina pectoris: Secondary | ICD-10-CM | POA: Diagnosis not present

## 2018-12-14 DIAGNOSIS — I480 Paroxysmal atrial fibrillation: Secondary | ICD-10-CM

## 2018-12-14 DIAGNOSIS — L89154 Pressure ulcer of sacral region, stage 4: Secondary | ICD-10-CM

## 2018-12-14 LAB — CBC
Hematocrit: 33.7 % — ABNORMAL LOW (ref 37.5–51.0)
Hemoglobin: 11.3 g/dL — ABNORMAL LOW (ref 13.0–17.7)
MCH: 30.1 pg (ref 26.6–33.0)
MCHC: 33.5 g/dL (ref 31.5–35.7)
MCV: 90 fL (ref 79–97)
Platelets: 262 10*3/uL (ref 150–450)
RBC: 3.75 x10E6/uL — ABNORMAL LOW (ref 4.14–5.80)
RDW: 14.6 % (ref 11.6–15.4)
WBC: 6.6 10*3/uL (ref 3.4–10.8)

## 2018-12-14 SURGERY — WOUND EXPLORATION
Anesthesia: General

## 2018-12-14 NOTE — Patient Instructions (Signed)
Medication Instructions:  No changes If you need a refill on your cardiac medications before your next appointment, please call your pharmacy.   Lab work: Today: CBC If you have labs (blood work) drawn today and your tests are completely normal, you will receive your results only by: Marland Kitchen MyChart Message (if you have MyChart) OR . A paper copy in the mail If you have any lab test that is abnormal or we need to change your treatment, we will call you to review the results.  Testing/Procedures: none  Follow-Up: At [facility], you and your health needs are our priority.  As part of our continuing mission to provide you with exceptional heart care, we have created designated Provider Care Teams.  These Care Teams include your primary Cardiologist (physician) and Advanced Practice Providers (APPs -  Physician Assistants and Nurse Practitioners) who all work together to provide you with the care you need, when you need it. You will need a follow up appointment in:  (July/August) 5 months.  Please call our office 2 months in advance to schedule this appointment.  You may see Dorris Carnes, MD or one of the following Advanced Practice Providers on your designated Care Team: Richardson Dopp, PA-C [place], [place] . Daune Perch, NP  Any Other Special Instructions Will Be Listed Below (If Applicable).

## 2018-12-14 NOTE — Progress Notes (Signed)
Cardiology Office Note   Date:  12/14/2018   ID:  [name] 12/25/1930, MRN [id number]  PCP:  [name], FNP  Cardiologist:   Dorris Carnes, MD   Pt presents for f/u of CAD     History of Present Illness: [name] is a 85 y.o. male with a history of CAD s/p stent to RCA and cutting balloon angioplasty to the Dx, chronic chest pain, chronic dysphagia s/p dilations x2, HTN, HLD, and BPH who presented to the ED on 11/14/15 with CP.  Last cat cath in 2015 showed patent stent and nonobstructive disease in the diagonal (12/2013). The pt was admitted in 2016 for  RO for MI  Myovue showedInferolateral MI  No ischemia  LVEF 55%  Losartan 25 mg was started for better BP contro  Admitted in APril 2019 after fall and decubitus    Was found to be in atrial fibrillation       I saw the pt last in November 2019   The pt was hospitalized in Dec 2019 with a lower GI bleed He was seen in ED after for weakness  Plan for surgery to decubitus  The pt denies CP   Breathing is OK      Outpatient Medications Prior to Visit  Medication Sig Dispense Refill  . amiodarone (PACERONE) 200 MG tablet Take 0.5 tablets (100 mg total) by mouth daily. 90 tablet 3  . amLODipine (NORVASC) 2.5 MG tablet Take 0.5 tablets (1.25 mg total) by mouth daily. 45 tablet 3  . apixaban (ELIQUIS) 5 MG TABS tablet Take 1 tablet (5 mg total) by mouth 2 (two) times daily. 60 tablet 11  . atorvastatin (LIPITOR) 80 MG tablet Take 1 tablet (80 mg total) by mouth daily. 90 tablet 3  . doxazosin (CARDURA) 1 MG tablet TAKE 1 TABLET BY MOUTH ONCE DAILY. (Patient taking differently: Take 1 mg by mouth daily. ) 15 tablet 0  . doxycycline (VIBRAMYCIN) 100 MG capsule Take 100 mg by mouth 2 (two) times daily.    . feeding supplement, ENSURE ENLIVE, (ENSURE ENLIVE) LIQD Take 237 mLs by mouth 2 (two) times daily between meals. 237 mL 12  . HYDROcodone-acetaminophen (NORCO) 7.5-325 MG tablet Take 1 tablet by mouth every 4  (four) hours as needed for moderate pain. FOR PAIN (Patient taking differently: Take 1 tablet by mouth every 4 (four) hours as needed for moderate pain. ) 20 tablet 0  . Melatonin 10 MG TABS Take 10 mg by mouth at bedtime.    . pantoprazole (PROTONIX) 40 MG tablet TAKE 1 TABLET BY MOUTH TWICE DAILY (Patient taking differently: Take 40 mg by mouth daily. ) 30 tablet 0  . phenylephrine-shark liver oil-mineral oil-petrolatum (PREPARATION H) 0.25-3-14-71.9 % rectal ointment Place 1 application rectally 2 (two) times daily as needed for hemorrhoids.    . tamsulosin (FLOMAX) 0.4 MG CAPS capsule Take 0.4 mg by mouth 2 (two) times daily.     . vitamin B-12 1000 MCG tablet Take 1 tablet (1,000 mcg total) by mouth daily. 30 tablet 0  . SYMPROIC 0.2 MG TABS Take 0.2 mg by mouth daily.  2   No facility-administered medications prior to visit.      Allergies:   Budesonide-formoterol fumarate; Iohexol; Ivp dye [iodinated diagnostic agents]; Simvastatin; Demerol [meperidine]; Tape; Naproxen; Pregabalin; and Sulfonamide derivatives   Past Medical History:  Diagnosis Date  . Arthritis    "hips, back" (06/17/2015)  . Asthma   . Atrial fibrillation (  [place]) 01/21/2018  . Chronic chest pain   . Chronic lower back pain   . Coronary artery disease    a. s/p BMS to RCA in 2002; b. s/p cutting balloon POBA ;   c. cath 6/12: oDx 80% (treated with repeat cutting balloon POBA), mLAD 50% with 30-40% at Dx, CFX 30%, pRCA 25% with patent stents;  d.  Lex MV 4/14:  Low Risk - EF 61%, inf scar with peri-infarct ischemia  . Dyspnea    chronic  . Essential hypertension   . GERD (gastroesophageal reflux disease)    h/o esophageal spasm  . GI bleed 03/03/2014  . Headache   . History of blood transfusion    "related to OR"  . History of hiatal hernia   . Hyperlipidemia   . Hypertension   . Melanoma of lower back ([place]) late 1990's  . Memory loss   . Myocardial infarction ([place]) 2001   x 1, confirned 1 possible  .  Pre-syncope 07/31/2017    Past Surgical History:  Procedure Laterality Date  . ANTERIOR CERVICAL DECOMP/DISCECTOMY FUSION    . BACK SURGERY  multiple  . CARDIAC CATHETERIZATION     "I've had 17 caths" (06/17/2015)  . CATARACT EXTRACTION W/ INTRAOCULAR LENS  IMPLANT, BILATERAL Bilateral   . CERVICAL DISC SURGERY  multiple  . COLONOSCOPY WITH PROPOFOL N/A 04/17/2014   Procedure: COLONOSCOPY WITH PROPOFOL;  Surgeon: Winfield Cunas., MD;  Location: [facility] ENDOSCOPY;  Service: Endoscopy;  Laterality: N/A;  . CORONARY ANGIOPLASTY WITH STENT PLACEMENT  x 2 stents    previous percutaneous intervention on the  RCA and the diagonal branch  . ESOPHAGOGASTRODUODENOSCOPY  03/23/2012   Procedure: ESOPHAGOGASTRODUODENOSCOPY (EGD);  Surgeon: Winfield Cunas., MD;  Location: [facility];  Service: Endoscopy;  Laterality: N/A;  . ESOPHAGOGASTRODUODENOSCOPY N/A 03/04/2014   Procedure: ESOPHAGOGASTRODUODENOSCOPY (EGD);  Surgeon: Arta Silence, MD;  Location: [facility] ENDOSCOPY;  Service: Endoscopy;  Laterality: N/A;  . LAMINECTOMY    . LEFT HEART CATHETERIZATION WITH CORONARY ANGIOGRAM N/A 01/13/2014   Procedure: LEFT HEART CATHETERIZATION WITH CORONARY ANGIOGRAM;  Surgeon: Blane Ohara, MD;  Location: [facility] CATH LAB;  Service: Cardiovascular;  Laterality: N/A;  . MELANOMA EXCISION  late 1990's   "lower back"  . POSTERIOR LAMINECTOMY / DECOMPRESSION CERVICAL SPINE    . SAVORY DILATION  03/23/2012   Procedure: SAVORY DILATION;  Surgeon: Winfield Cunas., MD;  Location: [facility];  Service: Endoscopy;  Laterality: N/A;  . TONSILLECTOMY  1930's     Social History:  The patient  reports that he has never smoked. He has never used smokeless tobacco. He reports that he does not drink alcohol or use drugs.   Family History:  The patient's family history includes Asthma in his father; Colitis in his son; Colon cancer in his son; Crohn's disease in his son; Diabetes in his father; Heart disease in his father and  mother; Prostate cancer in his paternal grandfather.    ROS:  Please see the history of present illness. All other systems are reviewed and  Negative to the above problem except as noted.    PHYSICAL EXAM: VS:  BP (!) 144/68   Pulse (!) 56   Ht 5\' 6"  (1.676 m)   Wt 169 lb 3.2 oz (76.7 kg)   BMI 27.31 kg/m     GEN: Frail 85 yo in , in no acute distress  HEENT: normal  Neck: JVP is normal   No, carotid bruits, or masses  Cardiac: RRR      No signif murmurs   No S3  Tr edema  Respiratory:  clear to auscultation bilaterally, normal work of breathing GI: soft, nontender, nondistended, + BS  No hepatomegaly  MS: no deformity Moving all extremities    EKG:  EKG is not ordered today   On 10/07/19:  SR 84 bpm  Nonspecific ST changes     Lipid Panel    Component Value Date/Time   CHOL 126 04/12/2018 1043   TRIG 65 04/12/2018 1043   HDL 43 04/12/2018 1043   CHOLHDL 2.9 04/12/2018 1043   CHOLHDL 3 01/19/2015 1315   VLDL 14.2 01/19/2015 1315   LDLCALC 70 04/12/2018 1043      Wt Readings from Last 3 Encounters:  12/14/18 169 lb 3.2 oz (76.7 kg)  10/06/18 178 lb (80.7 kg)  10/02/18 174 lb (78.9 kg)      ASSESSMENT AND PLAN:  1 CAD No symptoms to sugg angina Pt is not having any active anginal symptoms   VOlume status is OK    Ovreall I think he is at low risk for major cardiac event fro surgery and OK to proceed   2  PAF   Keep on current regimen  Amio 100 mg and on Eliquis   OK to hold after Saturday      3  Unsteadiness   Consder referral for PT/balance training     4   HL  Continue on lipitor  5  GI   Pt still has some blood in BM     Will check CBC   Should have f/u in GI    F/U in August 2020      Current medicines are reviewed at length with the patient today.  The patient does not have concerns regarding medicines.  The following changes have been made:   Labs/ tests ordered today include: No orders of the defined types were placed in this  encounter.    Disposition:   FU with  in   Signed, Dorris Carnes, MD  12/14/2018 10:11 AM    [address], [place], [place]  [address] Phone: [phone]; Fax: [phone]

## 2018-12-17 ENCOUNTER — Encounter
Admission: RE | Admit: 2018-12-17 | Discharge: 2018-12-17 | Disposition: A | Discharge status: Home / Self Care | Payer: Medicare Other | Source: Ambulatory Visit | Attending: Surgery | Admitting: Surgery

## 2018-12-17 ENCOUNTER — Other Ambulatory Visit: Payer: Self-pay

## 2018-12-17 ENCOUNTER — Ambulatory Visit: Payer: Medicare Other | Admitting: Family Medicine

## 2018-12-17 DIAGNOSIS — Z888 Allergy status to other drugs, medicaments and biological substances status: Secondary | ICD-10-CM | POA: Diagnosis not present

## 2018-12-17 DIAGNOSIS — R51 Headache: Secondary | ICD-10-CM | POA: Diagnosis not present

## 2018-12-17 DIAGNOSIS — K449 Diaphragmatic hernia without obstruction or gangrene: Secondary | ICD-10-CM | POA: Diagnosis not present

## 2018-12-17 DIAGNOSIS — Z882 Allergy status to sulfonamides status: Secondary | ICD-10-CM | POA: Diagnosis not present

## 2018-12-17 DIAGNOSIS — Z981 Arthrodesis status: Secondary | ICD-10-CM | POA: Diagnosis not present

## 2018-12-17 DIAGNOSIS — Z85820 Personal history of malignant melanoma of skin: Secondary | ICD-10-CM | POA: Diagnosis not present

## 2018-12-17 DIAGNOSIS — Z7901 Long term (current) use of anticoagulants: Secondary | ICD-10-CM | POA: Diagnosis not present

## 2018-12-17 DIAGNOSIS — G8929 Other chronic pain: Secondary | ICD-10-CM | POA: Diagnosis not present

## 2018-12-17 DIAGNOSIS — I252 Old myocardial infarction: Secondary | ICD-10-CM | POA: Diagnosis not present

## 2018-12-17 DIAGNOSIS — K219 Gastro-esophageal reflux disease without esophagitis: Secondary | ICD-10-CM | POA: Diagnosis not present

## 2018-12-17 DIAGNOSIS — I2510 Atherosclerotic heart disease of native coronary artery without angina pectoris: Secondary | ICD-10-CM | POA: Diagnosis not present

## 2018-12-17 DIAGNOSIS — L89151 Pressure ulcer of sacral region, stage 1: Secondary | ICD-10-CM | POA: Diagnosis present

## 2018-12-17 DIAGNOSIS — Z79899 Other long term (current) drug therapy: Secondary | ICD-10-CM | POA: Diagnosis not present

## 2018-12-17 DIAGNOSIS — Z886 Allergy status to analgesic agent status: Secondary | ICD-10-CM | POA: Diagnosis not present

## 2018-12-17 DIAGNOSIS — I4891 Unspecified atrial fibrillation: Secondary | ICD-10-CM | POA: Diagnosis not present

## 2018-12-17 DIAGNOSIS — I10 Essential (primary) hypertension: Secondary | ICD-10-CM | POA: Diagnosis not present

## 2018-12-17 DIAGNOSIS — M545 Low back pain: Secondary | ICD-10-CM | POA: Diagnosis not present

## 2018-12-17 DIAGNOSIS — Z01812 Encounter for preprocedural laboratory examination: Secondary | ICD-10-CM | POA: Insufficient documentation

## 2018-12-17 DIAGNOSIS — M1990 Unspecified osteoarthritis, unspecified site: Secondary | ICD-10-CM | POA: Diagnosis not present

## 2018-12-17 DIAGNOSIS — E785 Hyperlipidemia, unspecified: Secondary | ICD-10-CM | POA: Diagnosis not present

## 2018-12-17 DIAGNOSIS — J45909 Unspecified asthma, uncomplicated: Secondary | ICD-10-CM | POA: Diagnosis not present

## 2018-12-17 HISTORY — DX: Angina pectoris, unspecified: I20.9

## 2018-12-17 HISTORY — DX: Cardiac arrhythmia, unspecified: I49.9

## 2018-12-17 MED ORDER — CEFAZOLIN SODIUM-DEXTROSE 2-4 GM/100ML-% IV SOLN
2.0000 g | INTRAVENOUS | Status: AC
  Administered 2018-12-18: 2 g via INTRAVENOUS

## 2018-12-17 NOTE — Patient Instructions (Signed)
Your procedure is scheduled on: 12/18/18 Report to [facility] at 6:00 am. To find out your arrival time please call [phone] between 1PM - 3PM on 12/17/18.  Remember: Instructions that are not followed completely may result in serious medical risk, up to and including death, or upon the discretion of your surgeon and anesthesiologist your surgery may need to be rescheduled.     _X__ 1. Do not eat food after midnight the night before your procedure.                 No gum chewing or hard candies. You may drink clear liquids up to 2 hours                 before you are scheduled to arrive for your surgery- DO not drink clear                 liquids within 2 hours of the start of your surgery.                 Clear Liquids include:  water, apple juice without pulp, clear carbohydrate                 drink such as Clearfast or Gatorade, Black Coffee or Tea (Do not add                 anything to coffee or tea).  __X__2.  On the morning of surgery brush your teeth with toothpaste and water, you                 may rinse your mouth with mouthwash if you wish.  Do not swallow any              toothpaste of mouthwash.     _X__ 3.  No Alcohol for 24 hours before or after surgery.   _X__ 4.  Do Not Smoke or use e-cigarettes For 24 Hours Prior to Your Surgery.                 Do not use any chewable tobacco products for at least 6 hours prior to                 surgery.  ____  5.  Bring all medications with you on the day of surgery if instructed.   __X__  6.  Notify your doctor if there is any change in your medical condition      (cold, fever, infections).     Do not wear jewelry, make-up, hairpins, clips or nail polish. Do not wear lotions, powders, or perfumes.  Do not shave 48 hours prior to surgery. Men may shave face and neck. Do not bring valuables to the hospital.    [facility] is not responsible for any belongings or  valuables.  Contacts, dentures/partials or body piercings may not be worn into surgery. Bring a case for your contacts, glasses or hearing aids, a denture cup will be supplied. Leave your suitcase in the car. After surgery it may be brought to your room. For patients admitted to the hospital, discharge time is determined by your treatment team.   Patients discharged the day of surgery will not be allowed to drive home.   Please read over the following fact sheets that you were given:   MRSA Information  __X__ Take these medicines the morning of surgery with A SIP OF WATER:  1. amiodarone (PACERONE)  2. amLODipine (NORVASC)   3. atorvastatin (LIPITOR)  4. doxazosin (CARDURA  5. doxycycline (VIBRAMYCIN)  6. pantoprazole (PROTONIX)  7. tamsulosin (FLOMAX)   ____ Fleet Enema (as directed)   __X__ Use CHG Soap/SAGE wipes as directed  ____ Use inhalers on the day of surgery  ____ Stop metformin/Janumet/Farxiga 2 days prior to surgery    ____ Take 1/2 of usual insulin dose the night before surgery. No insulin the morning          of surgery.   __X__ Stop Blood Thinners Coumadin/Plavix/Xarelto/Pleta/Pradaxa/Eliquis/Effient/Aspirin  on   Or contact your Surgeon, Cardiologist or Medical Doctor regarding  ability to stop your blood thinners  __X__ Stop Anti-inflammatories 7 days before surgery such as Advil, Ibuprofen, Motrin,  BC or Goodies Powder, Naprosyn, Naproxen, Aleve, Aspirin    __X__ Stop all herbal supplements, fish oil or vitamin E until after surgery.    ____ Bring C-Pap to the hospital.

## 2018-12-18 ENCOUNTER — Ambulatory Visit
Admission: RE | Admit: 2018-12-18 | Discharge: 2018-12-18 | Disposition: A | Discharge status: Home / Self Care | Payer: Medicare Other | Attending: Surgery | Admitting: Surgery

## 2018-12-18 ENCOUNTER — Other Ambulatory Visit: Payer: Self-pay

## 2018-12-18 ENCOUNTER — Ambulatory Visit: Payer: Medicare Other | Admitting: Certified Registered Nurse Anesthetist

## 2018-12-18 ENCOUNTER — Encounter
Admission: RE | Disposition: A | Discharge status: Home / Self Care | Payer: Self-pay | Source: Home / Self Care | Attending: Surgery

## 2018-12-18 ENCOUNTER — Encounter: Payer: Self-pay | Admitting: *Deleted

## 2018-12-18 DIAGNOSIS — K219 Gastro-esophageal reflux disease without esophagitis: Secondary | ICD-10-CM | POA: Insufficient documentation

## 2018-12-18 DIAGNOSIS — Z886 Allergy status to analgesic agent status: Secondary | ICD-10-CM | POA: Insufficient documentation

## 2018-12-18 DIAGNOSIS — Z7901 Long term (current) use of anticoagulants: Secondary | ICD-10-CM | POA: Insufficient documentation

## 2018-12-18 DIAGNOSIS — I10 Essential (primary) hypertension: Secondary | ICD-10-CM | POA: Insufficient documentation

## 2018-12-18 DIAGNOSIS — Z79899 Other long term (current) drug therapy: Secondary | ICD-10-CM | POA: Insufficient documentation

## 2018-12-18 DIAGNOSIS — E785 Hyperlipidemia, unspecified: Secondary | ICD-10-CM | POA: Insufficient documentation

## 2018-12-18 DIAGNOSIS — I2510 Atherosclerotic heart disease of native coronary artery without angina pectoris: Secondary | ICD-10-CM | POA: Insufficient documentation

## 2018-12-18 DIAGNOSIS — K449 Diaphragmatic hernia without obstruction or gangrene: Secondary | ICD-10-CM | POA: Insufficient documentation

## 2018-12-18 DIAGNOSIS — I4891 Unspecified atrial fibrillation: Secondary | ICD-10-CM | POA: Insufficient documentation

## 2018-12-18 DIAGNOSIS — M545 Low back pain: Secondary | ICD-10-CM | POA: Insufficient documentation

## 2018-12-18 DIAGNOSIS — L89151 Pressure ulcer of sacral region, stage 1: Secondary | ICD-10-CM | POA: Insufficient documentation

## 2018-12-18 DIAGNOSIS — G8929 Other chronic pain: Secondary | ICD-10-CM | POA: Insufficient documentation

## 2018-12-18 DIAGNOSIS — R51 Headache: Secondary | ICD-10-CM | POA: Insufficient documentation

## 2018-12-18 DIAGNOSIS — I252 Old myocardial infarction: Secondary | ICD-10-CM | POA: Insufficient documentation

## 2018-12-18 DIAGNOSIS — Z981 Arthrodesis status: Secondary | ICD-10-CM | POA: Insufficient documentation

## 2018-12-18 DIAGNOSIS — S31000A Unspecified open wound of lower back and pelvis without penetration into retroperitoneum, initial encounter: Secondary | ICD-10-CM

## 2018-12-18 DIAGNOSIS — Z882 Allergy status to sulfonamides status: Secondary | ICD-10-CM | POA: Insufficient documentation

## 2018-12-18 DIAGNOSIS — J45909 Unspecified asthma, uncomplicated: Secondary | ICD-10-CM | POA: Insufficient documentation

## 2018-12-18 DIAGNOSIS — Z85820 Personal history of malignant melanoma of skin: Secondary | ICD-10-CM | POA: Insufficient documentation

## 2018-12-18 DIAGNOSIS — Z888 Allergy status to other drugs, medicaments and biological substances status: Secondary | ICD-10-CM | POA: Insufficient documentation

## 2018-12-18 DIAGNOSIS — M1990 Unspecified osteoarthritis, unspecified site: Secondary | ICD-10-CM | POA: Insufficient documentation

## 2018-12-18 HISTORY — PX: WOUND DEBRIDEMENT: SHX247

## 2018-12-18 SURGERY — DEBRIDEMENT, WOUND
Anesthesia: General

## 2018-12-18 MED ORDER — ROCURONIUM BROMIDE 50 MG/5ML IV SOLN
INTRAVENOUS | Status: AC
  Filled 2018-12-18: qty 1

## 2018-12-18 MED ORDER — FENTANYL CITRATE (PF) 100 MCG/2ML IJ SOLN
INTRAMUSCULAR | Status: DC | PRN
  Administered 2018-12-18 (×2): 25 ug via INTRAVENOUS

## 2018-12-18 MED ORDER — LIDOCAINE HCL (CARDIAC) PF 100 MG/5ML IV SOSY
PREFILLED_SYRINGE | INTRAVENOUS | Status: DC | PRN
  Administered 2018-12-18: 80 mg via INTRAVENOUS

## 2018-12-18 MED ORDER — GLYCOPYRROLATE 0.2 MG/ML IJ SOLN
INTRAMUSCULAR | Status: AC
  Filled 2018-12-18: qty 1

## 2018-12-18 MED ORDER — ONDANSETRON HCL 4 MG/2ML IJ SOLN
INTRAMUSCULAR | Status: DC | PRN
  Administered 2018-12-18: 4 mg via INTRAVENOUS

## 2018-12-18 MED ORDER — LIDOCAINE HCL (PF) 2 % IJ SOLN
INTRAMUSCULAR | Status: AC
  Filled 2018-12-18: qty 10

## 2018-12-18 MED ORDER — ONDANSETRON HCL 4 MG/2ML IJ SOLN
INTRAMUSCULAR | Status: AC
  Filled 2018-12-18: qty 2

## 2018-12-18 MED ORDER — CEFAZOLIN SODIUM-DEXTROSE 2-4 GM/100ML-% IV SOLN
INTRAVENOUS | Status: AC
  Filled 2018-12-18: qty 100

## 2018-12-18 MED ORDER — FENTANYL CITRATE (PF) 100 MCG/2ML IJ SOLN
INTRAMUSCULAR | Status: AC
  Filled 2018-12-18: qty 2

## 2018-12-18 MED ORDER — BUPIVACAINE HCL (PF) 0.25 % IJ SOLN
INTRAMUSCULAR | Status: AC
  Filled 2018-12-18: qty 30

## 2018-12-18 MED ORDER — LACTATED RINGERS IV SOLN
INTRAVENOUS | Status: DC
  Administered 2018-12-18: 07:00:00 via INTRAVENOUS

## 2018-12-18 MED ORDER — PROPOFOL 10 MG/ML IV BOLUS
INTRAVENOUS | Status: AC
  Filled 2018-12-18: qty 20

## 2018-12-18 MED ORDER — MORPHINE SULFATE (PF) 4 MG/ML IV SOLN
INTRAVENOUS | Status: AC
  Filled 2018-12-18: qty 1

## 2018-12-18 MED ORDER — MORPHINE SULFATE (PF) 4 MG/ML IV SOLN
1.0000 mg | INTRAVENOUS | Status: DC | PRN
  Administered 2018-12-18 (×3): 2 mg via INTRAVENOUS

## 2018-12-18 MED ORDER — LIDOCAINE HCL 1 % IJ SOLN
INTRAMUSCULAR | Status: DC | PRN
  Administered 2018-12-18: 7 mL

## 2018-12-18 MED ORDER — SUCCINYLCHOLINE CHLORIDE 20 MG/ML IJ SOLN
INTRAMUSCULAR | Status: DC | PRN
  Administered 2018-12-18: 100 mg via INTRAVENOUS

## 2018-12-18 MED ORDER — LIDOCAINE HCL (PF) 1 % IJ SOLN
INTRAMUSCULAR | Status: AC
  Filled 2018-12-18: qty 30

## 2018-12-18 MED ORDER — EPHEDRINE SULFATE 50 MG/ML IJ SOLN
INTRAMUSCULAR | Status: AC
  Filled 2018-12-18: qty 1

## 2018-12-18 MED ORDER — MORPHINE SULFATE (PF) 4 MG/ML IV SOLN
INTRAVENOUS | Status: AC
  Administered 2018-12-18: 2 mg via INTRAVENOUS
  Filled 2018-12-18: qty 1

## 2018-12-18 MED ORDER — PROPOFOL 10 MG/ML IV BOLUS
INTRAVENOUS | Status: DC | PRN
  Administered 2018-12-18: 90 mg via INTRAVENOUS

## 2018-12-18 MED ORDER — ACETAMINOPHEN 325 MG PO TABS: 650.0000 mg | ORAL_TABLET | Freq: Three times a day (TID) | ORAL | 0 refills | Status: AC | PRN

## 2018-12-18 MED ORDER — PHENYLEPHRINE HCL 10 MG/ML IJ SOLN
INTRAMUSCULAR | Status: DC | PRN
  Administered 2018-12-18: 50 ug via INTRAVENOUS
  Administered 2018-12-18 (×7): 100 ug via INTRAVENOUS

## 2018-12-18 MED ORDER — EPHEDRINE SULFATE 50 MG/ML IJ SOLN
INTRAMUSCULAR | Status: DC | PRN
  Administered 2018-12-18 (×4): 10 mg via INTRAVENOUS

## 2018-12-18 MED ORDER — CHLORHEXIDINE GLUCONATE CLOTH 2 % EX PADS: 6.0000 | MEDICATED_PAD | Freq: Once | CUTANEOUS | Status: DC

## 2018-12-18 MED ORDER — PHENYLEPHRINE HCL 10 MG/ML IJ SOLN
INTRAMUSCULAR | Status: AC
  Filled 2018-12-18: qty 1

## 2018-12-18 MED ORDER — DOCUSATE SODIUM 100 MG PO CAPS: 100.0000 mg | ORAL_CAPSULE | Freq: Two times a day (BID) | ORAL | 0 refills | Status: DC | PRN

## 2018-12-18 MED ORDER — SUCCINYLCHOLINE CHLORIDE 20 MG/ML IJ SOLN
INTRAMUSCULAR | Status: AC
  Filled 2018-12-18: qty 1

## 2018-12-18 SURGICAL SUPPLY — 33 items
ADH LQ OCL WTPRF AMP STRL LF (MISCELLANEOUS) ×1
ADHESIVE MASTISOL STRL (MISCELLANEOUS) ×3 IMPLANT
BLADE CLIPPER SURG (BLADE) ×3 IMPLANT
BLADE SURG SZ10 CARB STEEL (BLADE) ×3 IMPLANT
CANISTER SUCT 1200ML W/VALVE (MISCELLANEOUS) ×3 IMPLANT
CNTNR SPEC 2.5X3XGRAD LEK (MISCELLANEOUS) ×1
CONT SPEC 4OZ STER OR WHT (MISCELLANEOUS) ×2
CONT SPEC 4OZ STRL OR WHT (MISCELLANEOUS) ×1
CONTAINER SPEC 2.5X3XGRAD LEK (MISCELLANEOUS) IMPLANT
COVER WAND RF STERILE (DRAPES) ×3 IMPLANT
DRAPE LAPAROTOMY 100X77 ABD (DRAPES) ×3 IMPLANT
ELECT REM PT RETURN 9FT ADLT (ELECTROSURGICAL) ×3
ELECTRODE REM PT RTRN 9FT ADLT (ELECTROSURGICAL) ×1 IMPLANT
GAUZE PACKING IODOFORM 1X5 (MISCELLANEOUS) ×2 IMPLANT
GAUZE SPONGE 4X4 12PLY STRL (GAUZE/BANDAGES/DRESSINGS) ×3 IMPLANT
GLOVE BIO SURGEON STRL SZ 6.5 (GLOVE) ×2 IMPLANT
GLOVE BIO SURGEONS STRL SZ 6.5 (GLOVE) ×1
GLOVE BIOGEL PI IND STRL 7.0 (GLOVE) ×1 IMPLANT
GLOVE BIOGEL PI INDICATOR 7.0 (GLOVE) ×2
GOWN STRL REUS W/ TWL LRG LVL3 (GOWN DISPOSABLE) ×2 IMPLANT
GOWN STRL REUS W/TWL LRG LVL3 (GOWN DISPOSABLE) ×6
NEEDLE HYPO 22GX1.5 SAFETY (NEEDLE) ×3 IMPLANT
NS IRRIG 500ML POUR BTL (IV SOLUTION) ×3 IMPLANT
PACK BASIN MINOR ARMC (MISCELLANEOUS) ×3 IMPLANT
SUT MNCRL 4-0 (SUTURE) ×3
SUT MNCRL 4-0 27XMFL (SUTURE) ×1
SUT VIC AB 3-0 SH 27 (SUTURE) ×3
SUT VIC AB 3-0 SH 27X BRD (SUTURE) ×1 IMPLANT
SUT VIC AB PLUS 45CM 1-MO-4 (SUTURE) IMPLANT
SUTURE MNCRL 4-0 27XMF (SUTURE) ×1 IMPLANT
SYR 10ML LL (SYRINGE) ×3 IMPLANT
SYR BULB IRRIG 60ML STRL (SYRINGE) ×3 IMPLANT
TAPE CLOTH 3X10 WHT NS LF (GAUZE/BANDAGES/DRESSINGS) ×3 IMPLANT

## 2018-12-18 NOTE — Anesthesia Post-op Follow-up Note (Signed)
Anesthesia QCDR form completed.        

## 2018-12-18 NOTE — Anesthesia Procedure Notes (Signed)
Procedure Name: Intubation Date/Time: 12/18/2018 7:43 AM Performed by: Rudean Hitt, CRNA Pre-anesthesia Checklist: Patient identified, Patient being monitored, Timeout performed, Emergency Drugs available and Suction available Patient Re-evaluated:Patient Re-evaluated prior to induction Oxygen Delivery Method: Circle system utilized Preoxygenation: Pre-oxygenation with 100% oxygen Induction Type: IV induction Ventilation: Mask ventilation without difficulty Laryngoscope Size: Mac and 4 Grade View: Grade I Tube type: Oral Tube size: 7.5 mm Number of attempts: 1 Airway Equipment and Method: Stylet Placement Confirmation: ETT inserted through vocal cords under direct vision,  positive ETCO2 and breath sounds checked- equal and bilateral Secured at: 22 cm Tube secured with: Tape Dental Injury: Teeth and Oropharynx as per pre-operative assessment

## 2018-12-18 NOTE — Anesthesia Preprocedure Evaluation (Addendum)
Anesthesia Evaluation  Patient identified by MRN, date of birth, ID band Patient awake    Reviewed: Allergy & Precautions, H&P , NPO status , Patient's Chart, lab work & pertinent test results  Airway Mallampati: II       Dental  (+) Poor Dentition, Chipped, Missing   Pulmonary asthma ,           Cardiovascular Exercise Tolerance: Poor hypertension, (-) angina (denies recent angina)+ CAD and + Past MI  + dysrhythmias Atrial Fibrillation   Ambulates with cane   Neuro/Psych  Headaches,  Neuromuscular disease (peripheral neuropathy) negative psych ROS   GI/Hepatic Neg liver ROS, hiatal hernia, GERD  ,  Endo/Other  negative endocrine ROS  Renal/GU      Musculoskeletal  (+) Arthritis ,   Abdominal   Peds  Hematology negative hematology ROS (+)   Anesthesia Other Findings Past Medical History: No date: Anginal pain ([place]) No date: Arthritis     Comment:  "hips, back" (06/17/2015) No date: Asthma 01/21/2018: Atrial fibrillation ([facility]) No date: Chronic chest pain No date: Chronic lower back pain No date: Coronary artery disease     Comment:  a. s/p BMS to RCA in 2002; b. s/p cutting balloon POBA ;              c. cath 6/12: oDx 80% (treated with repeat cutting               balloon POBA), mLAD 50% with 30-40% at Dx, CFX 30%, pRCA               25% with patent stents;  d.  Lex MV 4/14:  Low Risk - EF               61%, inf scar with peri-infarct ischemia No date: Dyspnea     Comment:  chronic No date: Dysrhythmia No date: Essential hypertension No date: GERD (gastroesophageal reflux disease)     Comment:  h/o esophageal spasm 03/03/2014: GI bleed No date: Headache No date: History of blood transfusion     Comment:  "related to OR" No date: History of hiatal hernia No date: Hyperlipidemia No date: Hypertension late 1990's: Melanoma of lower back ([place]) No date: Memory loss 2001: Myocardial infarction ([place])      Comment:  x 1, confirned 1 possible 07/31/2017: Pre-syncope  Past Surgical History: No date: ANTERIOR CERVICAL DECOMP/DISCECTOMY FUSION multiple: BACK SURGERY No date: CARDIAC CATHETERIZATION     Comment:  "I've had 17 caths" (06/17/2015) No date: CATARACT EXTRACTION W/ INTRAOCULAR LENS  IMPLANT, BILATERAL;  Bilateral multiple: CERVICAL DISC SURGERY 04/17/2014: COLONOSCOPY WITH PROPOFOL; N/A     Comment:  Procedure: COLONOSCOPY WITH PROPOFOL;  Surgeon: Winfield Cunas., MD;  Location: [facility] ENDOSCOPY;  Service:               Endoscopy;  Laterality: N/A; x 2 stents: CORONARY ANGIOPLASTY WITH STENT PLACEMENT     Comment:   previous percutaneous intervention on the  RCA and the               diagonal branch 03/23/2012: ESOPHAGOGASTRODUODENOSCOPY     Comment:  Procedure: ESOPHAGOGASTRODUODENOSCOPY (EGD);  Surgeon:               Winfield Cunas., MD;  Location: [facility];                Service: Endoscopy;  Laterality: N/A; 03/04/2014: ESOPHAGOGASTRODUODENOSCOPY; N/A     Comment:  Procedure: ESOPHAGOGASTRODUODENOSCOPY (EGD);  Surgeon:               Arta Silence, MD;  Location: [facility] ENDOSCOPY;  Service:               Endoscopy;  Laterality: N/A; No date: LAMINECTOMY 01/13/2014: LEFT HEART CATHETERIZATION WITH CORONARY ANGIOGRAM; N/A     Comment:  Procedure: LEFT HEART CATHETERIZATION WITH CORONARY               ANGIOGRAM;  Surgeon: Blane Ohara, MD;  Location: [facility]               CATH LAB;  Service: Cardiovascular;  Laterality: N/A; late 1990's: MELANOMA EXCISION     Comment:  "lower back" No date: POSTERIOR LAMINECTOMY / DECOMPRESSION CERVICAL SPINE 03/23/2012: SAVORY DILATION     Comment:  Procedure: SAVORY DILATION;  Surgeon: Winfield Cunas., MD;  Location: [facility] ENDOSCOPY;  Service: Endoscopy;                Laterality: N/A; 1930's: TONSILLECTOMY  BMI    Body Mass Index:  26.15 kg/m      Reproductive/Obstetrics negative OB ROS                             Anesthesia Physical Anesthesia Plan  ASA: III  Anesthesia Plan: General ETT   Post-op Pain Management:    Induction:   PONV Risk Score and Plan: Ondansetron, Dexamethasone, Midazolam and Treatment may vary due to age or medical condition  Airway Management Planned:   Additional Equipment:   Intra-op Plan:   Post-operative Plan:   Informed Consent: I have reviewed the patients History and Physical, chart, labs and discussed the procedure including the risks, benefits and alternatives for the proposed anesthesia with the patient or authorized representative who has indicated his/her understanding and acceptance.     Dental Advisory Given  Plan Discussed with: Anesthesiologist and CRNA  Anesthesia Plan Comments:         Anesthesia Quick Evaluation

## 2018-12-18 NOTE — Op Note (Signed)
Pre-Op Dx: sacral wound Post-Op Dx: same Anesthesia: local EBL: minimal Complications:  none apparent Specimen: sacral wound, periosteum for culture Procedure: excisional biopsy of sacral wound Surgeon: Lysle Pearl  Indication for procedure: Chronic sacral wound not healing as planned   Description of Procedure:  Consent obtained, time out performed.  Patient placed in right lateral position.  Area sterilized and draped in usual position.  Local infused to area previously marked.  4cm circumfrential incision made through dermis with 15blade and small amount of purulence and a tunneling tract noted in subcutaneous layer down to sacral bone.  The fistula tract and surrounding granulation tissue debrided sharply via scalpel down to sacral bone, where piece of periosteum removed for culture as well.  The wound in the end measured 4x2x2cm deep.  Irrigated and hemostasis confirmed prior to packing with 1in iodoform and covered with gauze and paper tape.   Pt tolerated procedure well, and transferred to PACU in stable condition. Sponge and instrument count correct at end of procedure.

## 2018-12-18 NOTE — Discharge Instructions (Signed)
Keep area covered until next followup visit.  If dressing falls out, pack wound with gauze and keep covered until next appointment.   Surgical Wound Debridement, Care After Refer to this sheet in the next few weeks. These instructions provide you with information about caring for yourself after your procedure. Your health care provider may also give you more specific instructions. Your treatment has been planned according to current medical practices, but problems sometimes occur. Call your health care provider if you have any problems or questions after your procedure. What can I expect after the procedure? After the procedure, it is common to have:  Pain or soreness.  Fluid that leaks from the wound.  Stiffness. Follow these instructions at home: Medicines  Take over-the-counter and prescription medicines only as told by your health care provider.  If you were prescribed an antibiotic medicine, take it or apply it as told by your health care provider. Do not stop taking or using the antibiotic even if your condition improves. Wound care  Follow instructions from your health care provider about: ? How to take care of your wound. ? When and how you should change your dressing. ? When you should remove your dressing. If your dressing is dry and stuck when you try to remove it, moisten or wet the dressing with saline or water so that it can be removed without harming your skin or wound tissue.  Check your wound every day for signs of infection. Have a caregiver do this for you if you are not able. Watch for: ? More redness, swelling, or pain. ? More fluid, blood, or pus. ? A bad smell. Activity   Do not drive or operate heavy machinery while taking prescription pain medicine.  Ask your health care provider what activities are safe for you. General instructions  Eat a healthy diet with lots of protein. Ask your health care provider to suggest the best diet for you.  Do not smoke.  Smoking makes it harder for your body to heal.  Keep all follow-up visits as told by your health care provider. This is important.  Do not take baths, swim, or use a hot tub until your health care provider approves. Contact a health care provider if:  You have a fever.  Your pain medicine is not helping.  Your wound is red and swollen.  You have increased bleeding.  You have pus coming from your wound.  You have a bad smell coming from your wound.  Your wound is not getting better after 1-2 weeks of treatment.  You develop a new medical condition, such as diabetes, peripheral vascular disease, or conditions that affect your defense (immune) system. This information is not intended to replace advice given to you by your health care provider. Make sure you discuss any questions you have with your health care provider. Document Released: 09/19/2012 Document Revised: 03/09/2016 Document Reviewed: 02/11/2015 Elsevier Interactive Patient Education  2019 [place]   1) The drugs that you were given will stay in your system until tomorrow so for the next 24 hours you should not:  A) Drive an automobile B) Make any legal decisions C) Drink any alcoholic beverage   2) You may resume regular meals tomorrow.  Today it is better to start with liquids and gradually work up to solid foods.  You may eat anything you prefer, but it is better to start with liquids, then soup and crackers, and gradually work up to  solid foods.   3) Please notify your doctor immediately if you have any unusual bleeding, trouble breathing, redness and pain at the surgery site, drainage, fever, or pain not relieved by medication.    4) Additional Instructions:        Please contact your physician with any problems or Same Day Surgery at [phone], Monday through Friday 6 am to 4 pm, or [place] [facility] number at [phone].

## 2018-12-18 NOTE — Interval H&P Note (Signed)
History and Physical Interval Note:  12/18/2018 7:01 AM  [name]  has presented today for surgery, with the diagnosis of SACRAL WOUND INFECTION  The various methods of treatment have been discussed with the patient and family. After consideration of risks, benefits and other options for treatment, the patient has consented to  Procedure(s): SACRAL WOUND EXPLORATION AND DEBRIDEMENT (N/A) as a surgical intervention .  The patient's history has been reviewed, patient examined, no change in status, stable for surgery.  I have reviewed the patient's chart and labs.  Questions were answered to the patient's satisfaction.     [name]

## 2018-12-18 NOTE — Transfer of Care (Signed)
Immediate Anesthesia Transfer of Care Note  Patient: [name]  Procedure(s) Performed: SACRAL WOUND EXPLORATION AND DEBRIDEMENT (N/A )  Patient Location: PACU  Anesthesia Type:General  Level of Consciousness: drowsy  Airway & Oxygen Therapy: Patient Spontanous Breathing and Patient connected to face mask oxygen  Post-op Assessment: Report given to RN and Post -op Vital signs reviewed and stable  Post vital signs: Reviewed and stable  Last Vitals:  Vitals Value Taken Time  BP 140/57 12/18/2018  8:40 AM  Temp    Pulse 64 12/18/2018  8:46 AM  Resp 13 12/18/2018  8:46 AM  SpO2 99 % 12/18/2018  8:46 AM  Vitals shown include unvalidated device data.  Last Pain:  Vitals:   12/18/18 0627  TempSrc: Oral  PainSc: 6       Patients Stated Pain Goal: 2 (56/81/27 5170)  Complications: No apparent anesthesia complications

## 2018-12-19 NOTE — Anesthesia Postprocedure Evaluation (Signed)
Anesthesia Post Note  Patient: [name]  Procedure(s) Performed: SACRAL WOUND EXPLORATION AND DEBRIDEMENT (N/A )  Patient location during evaluation: PACU Anesthesia Type: General Level of consciousness: awake and alert Pain management: pain level controlled Vital Signs Assessment: post-procedure vital signs reviewed and stable Respiratory status: spontaneous breathing, nonlabored ventilation and respiratory function stable Cardiovascular status: blood pressure returned to baseline and stable Postop Assessment: no apparent nausea or vomiting Anesthetic complications: no     Last Vitals:  Vitals:   12/18/18 0944 12/18/18 1056  BP: (!) 158/65 136/61  Pulse: 63 (!) 56  Resp: 16 16  Temp: (!) 36.4 C (!) 36.2 C  SpO2: 98% 100%    Last Pain:  Vitals:   12/18/18 1056  TempSrc: Temporal  PainSc: [place]

## 2018-12-19 NOTE — Addendum Note (Signed)
Addended by: Patterson Hammersmith A on: 12/19/2018 01:37 PM   Modules accepted: Orders

## 2018-12-20 LAB — SURGICAL PATHOLOGY

## 2018-12-23 LAB — AEROBIC/ANAEROBIC CULTURE (SURGICAL/DEEP WOUND)

## 2018-12-23 LAB — AEROBIC/ANAEROBIC CULTURE W GRAM STAIN (SURGICAL/DEEP WOUND)

## 2018-12-24 ENCOUNTER — Encounter: Payer: Medicare Other | Attending: Physician Assistant | Admitting: Physician Assistant

## 2018-12-24 DIAGNOSIS — Z809 Family history of malignant neoplasm, unspecified: Secondary | ICD-10-CM | POA: Diagnosis not present

## 2018-12-24 DIAGNOSIS — L299 Pruritus, unspecified: Secondary | ICD-10-CM | POA: Diagnosis not present

## 2018-12-24 DIAGNOSIS — Z85820 Personal history of malignant melanoma of skin: Secondary | ICD-10-CM | POA: Insufficient documentation

## 2018-12-24 DIAGNOSIS — E10622 Type 1 diabetes mellitus with other skin ulcer: Secondary | ICD-10-CM | POA: Diagnosis not present

## 2018-12-24 DIAGNOSIS — E1040 Type 1 diabetes mellitus with diabetic neuropathy, unspecified: Secondary | ICD-10-CM | POA: Diagnosis not present

## 2018-12-24 DIAGNOSIS — I2510 Atherosclerotic heart disease of native coronary artery without angina pectoris: Secondary | ICD-10-CM | POA: Diagnosis not present

## 2018-12-24 DIAGNOSIS — F0390 Unspecified dementia without behavioral disturbance: Secondary | ICD-10-CM | POA: Insufficient documentation

## 2018-12-24 DIAGNOSIS — Z8249 Family history of ischemic heart disease and other diseases of the circulatory system: Secondary | ICD-10-CM | POA: Insufficient documentation

## 2018-12-24 DIAGNOSIS — L89154 Pressure ulcer of sacral region, stage 4: Secondary | ICD-10-CM | POA: Insufficient documentation

## 2018-12-24 DIAGNOSIS — S4992XA Unspecified injury of left shoulder and upper arm, initial encounter: Secondary | ICD-10-CM | POA: Diagnosis not present

## 2018-12-24 DIAGNOSIS — M1990 Unspecified osteoarthritis, unspecified site: Secondary | ICD-10-CM | POA: Insufficient documentation

## 2018-12-24 DIAGNOSIS — I252 Old myocardial infarction: Secondary | ICD-10-CM | POA: Diagnosis not present

## 2018-12-24 DIAGNOSIS — I10 Essential (primary) hypertension: Secondary | ICD-10-CM | POA: Diagnosis not present

## 2018-12-24 DIAGNOSIS — W1830XA Fall on same level, unspecified, initial encounter: Secondary | ICD-10-CM | POA: Insufficient documentation

## 2018-12-24 DIAGNOSIS — J45909 Unspecified asthma, uncomplicated: Secondary | ICD-10-CM | POA: Insufficient documentation

## 2018-12-24 NOTE — Progress Notes (Signed)
CAREL, CARRIER ([id number]) Visit Report for 11/27/2018 Chief Complaint Document Details Patient Name: [name], STJAMES. Date of Service: 11/27/2018 2:30 PM Medical Record Number: [id number] Patient Account Number: [id number] Date of Birth/Sex: 02-02-1931 (85 y.o. M) Treating RN: Montey Hora Primary Care Provider: Burman Freestone Other Clinician: Referring Provider: Burman Freestone Treating Provider/Extender: [name], Saysha Menta Weeks in Treatment: 1 Information Obtained from: Patient Chief Complaint Sacral pressure ulcer Electronic Signature(s) Signed: 12/02/2018 7:15:48 AM By: Worthy Keeler PA-C Entered By: Worthy Keeler on 11/27/2018 14:45:20 [name] ([id number]) -------------------------------------------------------------------------------- HPI Details Patient Name: [name] Date of Service: 11/27/2018 2:30 PM Medical Record Number: [id number] Patient Account Number: [id number] Date of Birth/Sex: 07/10/1931 (85 y.o. M) Treating RN: Montey Hora Primary Care Provider: Burman Freestone Other Clinician: Referring Provider: Burman Freestone Treating Provider/Extender: [name], Eara Burruel Weeks in Treatment: 1 History of Present Illness HPI Description: 02/06/18 on evaluation today patient presents for initial evaluation and our clinic concerning an issue which began roughly 3 weeks ago when the patient fell in his home on the floor in his kitchen and laid him down this detergent for roughly 3 days. He had a pressure injury to the left shoulder. This unfortunately has caused him a lot of discomfort although it finally seems to be doing better if anything is really having a lot of itching right now. This appears potentially be a contact dermatitis issue. He also has a significant pressure injury to the sacrum at this time as well which is also showing fascia exposure right over the bone but no evidence of bone exposure at this point which is good news. They have been  using Santyl as well as Saline soaked gauze at this point in time. There does appear to be a lot of necrotic slough in the base of the wound. He does have a history of incontinence, myocardial infarction, and hypertension. He also is "borderline diabetic" hemoglobin A1c of 6.0. Currently he has some discomfort in the pressure site at the sacrum but fortunately nothing too significant this did require sharp debridement today. 02/13/18 on evaluation today patient appears to be doing much better in regard to his sacral wound. He has been tolerating the dressing changes without complication with the Vashe. Fortunately there is no evidence of infection and though there is some Slough on the surface of the wound bed he has excellent granulation noted. Overall I'm pleased with how things have progressed in that regard. A glance at his shoulder as well and the rash seems to be someone improving in my pinion at this site as well. Overall I am pleased with what we're seeing and so is the family. 02/20/18 on evaluation today patient appears to be doing a little worse in regard to the sacral wound only in the fact that there is redness surrounding it has me somewhat concerned for infection. The drainage has also apparently been a little bit off color compared to normal according to family they did keep the dressing today that was removed to show me and I agree this seems to be a little bit different compared to what we have been seeing. Coupled with the redness I'm concerned he may be developing some cellulitis surrounding the wound bed. 02/27/18 on evaluation today patient presents for follow-up concerning his sacral ulcer. We have received the results back from his wound culture which shows unfortunately that the doxycycline will not be of benefit for him I am going to need to initiate treatment with something else  in order to treat the pseudomonas. Otherwise he does not seem to be having any significant pain  although his daughter states there are sometimes when he states having pain. We continue to use the Vashe currently. 03/06/18 on evaluation today patient's sacral wound appears to be doing better in my opinion. He has been tolerating the dressing changes without complication. With that being said the silver nitrate has helped with the prominent area of hyper granulation at the 6 o'clock location we will likely need to repeat this again today. Nonetheless overall I am pleased with how things have improved over the last week. The erythema surrounding the wound seems to be greatly improved. 03/13/18 on evaluation today patient's wound actually does not appear to be terribly infected although she does continue to have erythema surrounding the wound bed especially on the left border. I'm still somewhat concerned about the fact that the [name] antibiotics alone may not be completely treating his infection. I previously discussed may need to go for IV antibiotic therapy I'm concerned that may be the case. We will need to make a referral today for infectious disease. 03/20/18 on evaluation today the patient sacral wound actually appears to be doing fairly well in regard to granulation although he continues unfortunately to have it your theme is surrounding the periwound region. There's also some increased swelling at the 6 o'clock location which also has me somewhat concerned. With that being said he does have some discomfort but nothing too significant at this point. He still has not heard from infectious disease his daughter and wife are both present during the office visit today they're going to check back with this again. We did get the information for them to call them today. 03/27/18 on evaluation today patient is seen concerning his ongoing sacral ulcer. He has been tolerating the dressing changes without complication. With that being said he does present with evidence of bright green drainage noted on the  dressing which again is something that I do often expect to see with a pseudomonas infection. He continues to have your theme is ACETON, KINNEAR ([id number]) surrounding the wound bed as well and again I'm not 100% convinced this is just pressure related. I did speak with Colletta Maryland who is the [name] practitioner in [place] with infectious disease. I spoke with her actually yesterday concerning this patient. She is not 100% convinced that this is infected. She question whether the wound may just be colonized with Pseudomonas and not actually causing an active infection. I am really not thinking that the edema is associated with pressure alone and again overall I don't feel that your theme and is consistent with a pressure injury either as he's never had any contusions noted like a deep tissue injury on his heel which was new and I did visualize today this was on the left heel. Nonetheless she wanted to give this a little bit more time and thought it would be appropriate to start the Wound VAC at this point. 04/03/18 on evaluation today patient sacral ulcer actually appears to be doing fairly well at this point. He has been tolerating the dressing changes without complication. With that being said I'm very pleased with the progress that has been made in regard to his sacral wound over the past week I do not see as much in the way of erythema which is great news. Nonetheless he does have a small area of hyper granulation unfortunately. This is at roughly the 7 o'clock location and I  think does need to be addressed so that this will heal more appropriately. Nonetheless I think we may be ready to go ahead and initiate therapy with the Wound VAC. 04/10/18 on evaluation today patient appears to be doing excellent in regard to his sacral ulcer. The show signs of great improvement in overall I'm very pleased with how things look. He has been tolerating the dressing changes without complication.  Specifically this is the Wound VAC. He also seems to be doing well with the antibiotic there is decreased your theme and redness surrounding the sacral area at this point in the wound has filled in quite significantly. 04/17/18 on evaluation today patient actually appears to be doing excellent in regard to his sacral ulcer. He's been tolerating the dressing changes without complication specifically the Wound VAC. There really are no major concerns from the patient nor family this point he is having no pain. He does have a little bit of Epiboly on the lateral portions of the wound where he does have a little bit more depth that will need to be addressed today. 04/23/18 on evaluation today patient's wound actually appears to be doing excellent at this point. He has been tolerating the Wound VAC and this appears to be doing well other than the fact that it seems to be breaking seal at the 6 o'clock location. I do believe that adding a duodenum dressing at this location try and help maintain the seal would be appropriate and likely very effective. With that being said he overall seems to be showing signs of good improvement at this point. There does not appear to be any evidence of significant infection which is also excellent news. 04/30/18 on evaluation today patient actually appears to be doing well in regard to his sacral ulcer. He's been tolerating the dressing changes without complication. Fortunately there does not appear to be any evidence of infection. Overall I'm very pleased with the progress that has been made up to this point. He does have some blistering underneath the draping unfortunately although this is definitely something that has been noted on other patients previously is a fairly common occurrence. Nonetheless the patient seems to be doing fairly well in general in my pinion based on what I see at this time. I do believe these are fairly superficial and minor. 05/07/18 on evaluation  today patient's wound actually appears to be doing excellent at this point in time. He has been tolerating the Wound VAC decently well he states that it is somewhat cumbersome to carry around unfortunately. The only other issue he's been having according to family is that they been having a difficult time keeping the Wound VAC in place and doing what is supposed to do without making. Obviously I do think that this is definitely of concern. Nonetheless I do believe she's made good progress up to this point. I'm very happy in that regard. 05/14/18 on evaluation today patient's wound continues to make good progress at this point. He had a minimal amount of slough noted on the surface which was easily wiped away with saline and gauze and in general I feel like that he is continuing to show excellent progress even with the discontinuation of the Wound VAC. Overall I'm pleased in this regard. He was having issues with the Wound VAC in getting it to seal I think that using the Prisma at this time has been equally efficient and getting the wound to diminish in size. 05/29/18- He is here in follow up  evaluation for a sacral ulcer. There is improvement, we will continue with prisma and he will follow up in two weeks 06/11/18 on evaluation today patient had unfortunately bright green drainage on the dressing upon evaluation today. This is something that we have encountered before although we were able to get things under control previously with antibiotics. With that being said currently upon further inspection of the three areas of hyper granulation that were separate from the actual wound itself which was almost healed it really appears that these all have some depth to them. They are more tunnels that really have not closed or at least have reopened as a result likely of infection in my pinion. This is definitely not what I was expecting or hoping for. [name], [name] ([id number]) 06/26/18 on evaluation  today patient continues to experience issues with what appears to be small abscesses in the sacral region unfortunately. With that being said he has been tolerating the dressing changes without complication which is good news. He's not having any significant discomfort which is also good news. With that being said his daughter states that after he left last week that the packing that we have placed fell out quite rapidly and he subsequently healed over very quick to the point they were not able to even repack the regions. Nonetheless there appear to be several fluctuance areas noted at this point there's one central region that does seem to be draining still discharge that is somewhat green in color. I did review the results of the wound culture which did show evidence of infection with both MRSA as well as pseudomonas based on that result. Nonetheless again with his other current medications we are not able to do the Cipro due to issues with potential long QT syndrome. I am going to give him a prescription for doxycycline in order to help with the potential MRSA infection. 07/09/18 on evaluation today patient actually appears to be doing about the same in regard to his sacral wound. He actually has his MRI scheduled for tomorrow and then subsequently is going to be having his infectious disease appointment for Thursday of this week. Fortunately he's not having any significant discomfort he still has your theme in the sacral region he still has several blister/flux went areas although they technically are not blisters this is more like a underlying abscess. The one area that is open still does probe down to bone. Again I am concerned about a deeper infection possibly even sacral osteomyelitis. 07/23/18 on evaluation today patient actually appears to be doing very well all things considered in regard to his sacral ulcer. Since I've last seen him he actually did have his MRI performed which showed that  he has a complex fluid collection superficial to the distal sacrum measuring 5.9 x 4.4 x 2.8 cm which abuts the posterior aspect of the distal sacrum and the sacrum itself shows cortical destruction consistent with osteomyelitis. She has also been seen by infectious disease and currently orders have been initiated for IV antibiotic therapy for the next eight weeks. He was seen by Janene Madeira NP and placed on Ceftazidime and Daptomycin. Currently he has not really been on this quite long enough to see a sufficient response to the new orders as far as antibiotic therapy is concerned. Nonetheless it does appear that he is likely on the right track at this point which is great news. Nonetheless the question which both Colletta Maryland and myself have discussed both with the patient and between  ourselves is whether or not the patient may need to be seen by surgeon for surgical evacuation of the fluid collection/abscess and possible debridement of the sacrum itself. Nonetheless at this time my personal opinion is probably gonna be that we wait and get this at least a couple weeks to see the response that he receives with the IV antibiotic therapy. 08/06/18 on evaluation today patient actually appears to be doing rather well in regard to his sacral ulcer region all things considered. He has been tolerating the dressing changes without complication. With that being said there is not any obvious opening nor any drainage noted at this point in time upon evaluation. The patient has been tolerating the dressing changes though. He's doing well with the IV antibiotic therapy. 08/20/18 on evaluation today patient appears to be doing wonderful in regard to his sacral region. In fact there appears to be no wound opening or drainage at this time. Overall I'm very pleased with how things have progressed. The patient likewise as well as his wife and daughter are very happy as well. Readmission: 11/20/18 on evaluation today  patient presents for reevaluation our clinic concerning issues with his sacral region. Unfortunately the area in question is the same region which we previously treated what we thought would successfully back in November 2019. At that time the patient underwent IV antibiotic therapy which seemed to do the job very well. Subsequently however in the past couple of weeks he has begun to have drainage and bleeding from the sacral region and is having increased pain yet again. Fortunately there is no signs of systemic infection although it does appear that the complex abscess that was previously noted may not have fully cleared there was a question between myself as well is infectious disease previous whether not he needed to see a surgeon being that things got better the family opted not to see a surgeon at that time. Nonetheless I am concerned that he may the need to see a surgeon in order to have this area surgically debrided and possibly a bone culture obtained in order to get a better idea of what we're treating and ensure that this is able to completely and fully heal. 11/27/18 on evaluation today patient appears to be doing about the same in regard to his sacral ulcer. He did see Dr. Lysle Pearl yesterday and the plan is to proceed forward with surgery to clean out the region. This seems to be the most appropriate goal of treatment at this point. Dr. Lysle Pearl just needed to speak with Dr. Ellene Route the patient's neurologist in order to get clearance as the patient was supposed to be scheduled for a carpal tunnel and elbow surgery with Dr. Ellene Route. Nonetheless he has apparently given clearance to proceed with this surgery for the sacral region which is deemed more important at this point. Unfortunately the patient had a little bit of increased pain although he is having redness at this point there does not appear to be any spreading infection which is good news. No fevers, chills, nausea, or vomiting noted at  this time. [name], [name] ([id number]) Electronic Signature(s) Signed: 12/02/2018 7:15:48 AM By: Worthy Keeler PA-C Entered By: Worthy Keeler on 12/01/2018 00:47:45 [name], [name] [name] ([id number]) -------------------------------------------------------------------------------- Physical Exam Details Patient Name: [name], [name] Date of Service: 11/27/2018 2:30 PM Medical Record Number: [id number] Patient Account Number: [id number] Date of Birth/Sex: 05-27-1931 (85 y.o. M) Treating RN: Montey Hora Primary Care Provider: Burman Freestone Other Clinician: Referring Provider:  HARRIS, MEREDITH Treating Provider/Extender: STONE III, Samani Deal Weeks in Treatment: 1 Constitutional Well-nourished and well-hydrated in no acute distress. Respiratory normal breathing without difficulty. clear to auscultation bilaterally. Cardiovascular regular rate and rhythm with normal S1, S2. Psychiatric this patient is able to make decisions and demonstrates good insight into disease process. Alert and Oriented x 3. pleasant and cooperative. Notes Patient's wound bed currently appears to be really about the same as what it was last week when I saw him for evaluation. I see no evidence of worsening at this point which is good news. Electronic Signature(s) Signed: 12/02/2018 7:15:48 AM By: Worthy Keeler PA-C Entered By: Worthy Keeler on 12/01/2018 00:48:11 [name] ([id number]) -------------------------------------------------------------------------------- Physician Orders Details Patient Name: [name], [name] Date of Service: 11/27/2018 2:30 PM Medical Record Number: [id number] Patient Account Number: [id number] Date of Birth/Sex: Feb 16, 1931 (85 y.o. M) Treating RN: Montey Hora Primary Care Provider: Burman Freestone Other Clinician: Referring Provider: Burman Freestone Treating Provider/Extender: [name], Danijah Noh Weeks in Treatment: 1 Verbal / Phone Orders: No Diagnosis  Coding ICD-10 Coding Code Description L89.154 Pressure ulcer of sacral region, stage 4 L02.31 Cutaneous abscess of buttock Wound Cleansing Wound #2 Midline Sacrum o Clean wound with Normal Saline. o May Shower, gently pat wound dry prior to applying new dressing. Anesthetic (add to Medication List) Wound #2 Midline Sacrum o Topical Lidocaine 4% cream applied to wound bed prior to debridement (In Clinic Only). Primary Wound Dressing Wound #2 Midline Sacrum o Silver Alginate Secondary Dressing Wound #2 Midline Sacrum o Boardered Foam Dressing Dressing Change Frequency Wound #2 Midline Sacrum o Change dressing every other day. Follow-up Appointments Wound #2 Midline Sacrum o Return Appointment in 1 week. Electronic Signature(s) Signed: 11/27/2018 4:52:44 PM By: Montey Hora Signed: 12/02/2018 7:15:48 AM By: Worthy Keeler PA-C Entered By: Montey Hora on 11/27/2018 15:25:10 [name] ([id number]) -------------------------------------------------------------------------------- Problem List Details Patient Name: [name], STAVE. Date of Service: 11/27/2018 2:30 PM Medical Record Number: [id number] Patient Account Number: [id number] Date of Birth/Sex: [birth year] (85 y.o. M) Treating RN: Montey Hora Primary Care Provider: Burman Freestone Other Clinician: Referring Provider: Burman Freestone Treating Provider/Extender: [name], Jaicion Laurie Weeks in Treatment: 1 Active Problems ICD-10 Evaluated Encounter Code Description Active Date Today Diagnosis L89.154 Pressure ulcer of sacral region, stage 4 11/20/2018 No Yes L02.31 Cutaneous abscess of buttock 11/20/2018 No Yes Inactive Problems Resolved Problems Electronic Signature(s) Signed: 12/02/2018 7:15:48 AM By: Worthy Keeler PA-C Entered By: Worthy Keeler on 11/27/2018 14:45:16 [name] ([id number]) -------------------------------------------------------------------------------- Progress Note  Details Patient Name: [name] Date of Service: 11/27/2018 2:30 PM Medical Record Number: [id number] Patient Account Number: [id number] Date of Birth/Sex: 1930/10/19 (85 y.o. M) Treating RN: Montey Hora Primary Care Provider: Burman Freestone Other Clinician: Referring Provider: Burman Freestone Treating Provider/Extender: [name], Lauris Serviss Weeks in Treatment: 1 Subjective Chief Complaint Information obtained from Patient Sacral pressure ulcer History of Present Illness (HPI) 02/06/18 on evaluation today patient presents for initial evaluation and our clinic concerning an issue which began roughly 3 weeks ago when the patient fell in his home on the floor in his kitchen and laid him down this detergent for roughly 3 days. He had a pressure injury to the left shoulder. This unfortunately has caused him a lot of discomfort although it finally seems to be doing better if anything is really having a lot of itching right now. This appears potentially be a contact dermatitis issue. He also has a significant pressure injury to the  sacrum at this time as well which is also showing fascia exposure right over the bone but no evidence of bone exposure at this point which is good news. They have been using Santyl as well as Saline soaked gauze at this point in time. There does appear to be a lot of necrotic slough in the base of the wound. He does have a history of incontinence, myocardial infarction, and hypertension. He also is "borderline diabetic" hemoglobin A1c of 6.0. Currently he has some discomfort in the pressure site at the sacrum but fortunately nothing too significant this did require sharp debridement today. 02/13/18 on evaluation today patient appears to be doing much better in regard to his sacral wound. He has been tolerating the dressing changes without complication with the Vashe. Fortunately there is no evidence of infection and though there is some Slough on the surface of the  wound bed he has excellent granulation noted. Overall I'm pleased with how things have progressed in that regard. A glance at his shoulder as well and the rash seems to be someone improving in my pinion at this site as well. Overall I am pleased with what we're seeing and so is the family. 02/20/18 on evaluation today patient appears to be doing a little worse in regard to the sacral wound only in the fact that there is redness surrounding it has me somewhat concerned for infection. The drainage has also apparently been a little bit off color compared to normal according to family they did keep the dressing today that was removed to show me and I agree this seems to be a little bit different compared to what we have been seeing. Coupled with the redness I'm concerned he may be developing some cellulitis surrounding the wound bed. 02/27/18 on evaluation today patient presents for follow-up concerning his sacral ulcer. We have received the results back from his wound culture which shows unfortunately that the doxycycline will not be of benefit for him I am going to need to initiate treatment with something else in order to treat the pseudomonas. Otherwise he does not seem to be having any significant pain although his daughter states there are sometimes when he states having pain. We continue to use the Vashe currently. 03/06/18 on evaluation today patient's sacral wound appears to be doing better in my opinion. He has been tolerating the dressing changes without complication. With that being said the silver nitrate has helped with the prominent area of hyper granulation at the 6 o'clock location we will likely need to repeat this again today. Nonetheless overall I am pleased with how things have improved over the last week. The erythema surrounding the wound seems to be greatly improved. 03/13/18 on evaluation today patient's wound actually does not appear to be terribly infected although she does continue  to have erythema surrounding the wound bed especially on the left border. I'm still somewhat concerned about the fact that the [name] antibiotics alone may not be completely treating his infection. I previously discussed may need to go for IV antibiotic therapy I'm concerned that may be the case. We will need to make a referral today for infectious disease. 03/20/18 on evaluation today the patient sacral wound actually appears to be doing fairly well in regard to granulation although he continues unfortunately to have it your theme is surrounding the periwound region. There's also some increased swelling at the 6 o'clock location which also has me somewhat concerned. With that being said he does have some discomfort  but [name], [name] ([id number]) nothing too significant at this point. He still has not heard from infectious disease his daughter and wife are both present during the office visit today they're going to check back with this again. We did get the information for them to call them today. 03/27/18 on evaluation today patient is seen concerning his ongoing sacral ulcer. He has been tolerating the dressing changes without complication. With that being said he does present with evidence of bright green drainage noted on the dressing which again is something that I do often expect to see with a pseudomonas infection. He continues to have your theme is surrounding the wound bed as well and again I'm not 100% convinced this is just pressure related. I did speak with Colletta Maryland who is the [name] practitioner in [place] with infectious disease. I spoke with her actually yesterday concerning this patient. She is not 100% convinced that this is infected. She question whether the wound may just be colonized with Pseudomonas and not actually causing an active infection. I am really not thinking that the edema is associated with pressure alone and again overall I don't feel that your theme and is  consistent with a pressure injury either as he's never had any contusions noted like a deep tissue injury on his heel which was new and I did visualize today this was on the left heel. Nonetheless she wanted to give this a little bit more time and thought it would be appropriate to start the Wound VAC at this point. 04/03/18 on evaluation today patient sacral ulcer actually appears to be doing fairly well at this point. He has been tolerating the dressing changes without complication. With that being said I'm very pleased with the progress that has been made in regard to his sacral wound over the past week I do not see as much in the way of erythema which is great news. Nonetheless he does have a small area of hyper granulation unfortunately. This is at roughly the 7 o'clock location and I think does need to be addressed so that this will heal more appropriately. Nonetheless I think we may be ready to go ahead and initiate therapy with the Wound VAC. 04/10/18 on evaluation today patient appears to be doing excellent in regard to his sacral ulcer. The show signs of great improvement in overall I'm very pleased with how things look. He has been tolerating the dressing changes without complication. Specifically this is the Wound VAC. He also seems to be doing well with the antibiotic there is decreased your theme and redness surrounding the sacral area at this point in the wound has filled in quite significantly. 04/17/18 on evaluation today patient actually appears to be doing excellent in regard to his sacral ulcer. He's been tolerating the dressing changes without complication specifically the Wound VAC. There really are no major concerns from the patient nor family this point he is having no pain. He does have a little bit of Epiboly on the lateral portions of the wound where he does have a little bit more depth that will need to be addressed today. 04/23/18 on evaluation today patient's wound actually  appears to be doing excellent at this point. He has been tolerating the Wound VAC and this appears to be doing well other than the fact that it seems to be breaking seal at the 6 o'clock location. I do believe that adding a duodenum dressing at this location try and help maintain the seal would be  appropriate and likely very effective. With that being said he overall seems to be showing signs of good improvement at this point. There does not appear to be any evidence of significant infection which is also excellent news. 04/30/18 on evaluation today patient actually appears to be doing well in regard to his sacral ulcer. He's been tolerating the dressing changes without complication. Fortunately there does not appear to be any evidence of infection. Overall I'm very pleased with the progress that has been made up to this point. He does have some blistering underneath the draping unfortunately although this is definitely something that has been noted on other patients previously is a fairly common occurrence. Nonetheless the patient seems to be doing fairly well in general in my pinion based on what I see at this time. I do believe these are fairly superficial and minor. 05/07/18 on evaluation today patient's wound actually appears to be doing excellent at this point in time. He has been tolerating the Wound VAC decently well he states that it is somewhat cumbersome to carry around unfortunately. The only other issue he's been having according to family is that they been having a difficult time keeping the Wound VAC in place and doing what is supposed to do without making. Obviously I do think that this is definitely of concern. Nonetheless I do believe she's made good progress up to this point. I'm very happy in that regard. 05/14/18 on evaluation today patient's wound continues to make good progress at this point. He had a minimal amount of slough noted on the surface which was easily wiped away with  saline and gauze and in general I feel like that he is continuing to show excellent progress even with the discontinuation of the Wound VAC. Overall I'm pleased in this regard. He was having issues with the Wound VAC in getting it to seal I think that using the Prisma at this time has been equally efficient and getting the wound to diminish in size. 05/29/18- He is here in follow up evaluation for a sacral ulcer. There is improvement, we will continue with prisma and he will follow up in two weeks [name], [name] ([id number]) 06/11/18 on evaluation today patient had unfortunately bright green drainage on the dressing upon evaluation today. This is something that we have encountered before although we were able to get things under control previously with antibiotics. With that being said currently upon further inspection of the three areas of hyper granulation that were separate from the actual wound itself which was almost healed it really appears that these all have some depth to them. They are more tunnels that really have not closed or at least have reopened as a result likely of infection in my pinion. This is definitely not what I was expecting or hoping for. 06/26/18 on evaluation today patient continues to experience issues with what appears to be small abscesses in the sacral region unfortunately. With that being said he has been tolerating the dressing changes without complication which is good news. He's not having any significant discomfort which is also good news. With that being said his daughter states that after he left last week that the packing that we have placed fell out quite rapidly and he subsequently healed over very quick to the point they were not able to even repack the regions. Nonetheless there appear to be several fluctuance areas noted at this point there's one central region that does seem to be draining still discharge that is  somewhat green in color. I did review  the results of the wound culture which did show evidence of infection with both MRSA as well as pseudomonas based on that result. Nonetheless again with his other current medications we are not able to do the Cipro due to issues with potential long QT syndrome. I am going to give him a prescription for doxycycline in order to help with the potential MRSA infection. 07/09/18 on evaluation today patient actually appears to be doing about the same in regard to his sacral wound. He actually has his MRI scheduled for tomorrow and then subsequently is going to be having his infectious disease appointment for Thursday of this week. Fortunately he's not having any significant discomfort he still has your theme in the sacral region he still has several blister/flux went areas although they technically are not blisters this is more like a underlying abscess. The one area that is open still does probe down to bone. Again I am concerned about a deeper infection possibly even sacral osteomyelitis. 07/23/18 on evaluation today patient actually appears to be doing very well all things considered in regard to his sacral ulcer. Since I've last seen him he actually did have his MRI performed which showed that he has a complex fluid collection superficial to the distal sacrum measuring 5.9 x 4.4 x 2.8 cm which abuts the posterior aspect of the distal sacrum and the sacrum itself shows cortical destruction consistent with osteomyelitis. She has also been seen by infectious disease and currently orders have been initiated for IV antibiotic therapy for the next eight weeks. He was seen by Janene Madeira NP and placed on Ceftazidime and Daptomycin. Currently he has not really been on this quite long enough to see a sufficient response to the new orders as far as antibiotic therapy is concerned. Nonetheless it does appear that he is likely on the right track at this point which is great news. Nonetheless the question which  both Colletta Maryland and myself have discussed both with the patient and between ourselves is whether or not the patient may need to be seen by surgeon for surgical evacuation of the fluid collection/abscess and possible debridement of the sacrum itself. Nonetheless at this time my personal opinion is probably gonna be that we wait and get this at least a couple weeks to see the response that he receives with the IV antibiotic therapy. 08/06/18 on evaluation today patient actually appears to be doing rather well in regard to his sacral ulcer region all things considered. He has been tolerating the dressing changes without complication. With that being said there is not any obvious opening nor any drainage noted at this point in time upon evaluation. The patient has been tolerating the dressing changes though. He's doing well with the IV antibiotic therapy. 08/20/18 on evaluation today patient appears to be doing wonderful in regard to his sacral region. In fact there appears to be no wound opening or drainage at this time. Overall I'm very pleased with how things have progressed. The patient likewise as well as his wife and daughter are very happy as well. Readmission: 11/20/18 on evaluation today patient presents for reevaluation our clinic concerning issues with his sacral region. Unfortunately the area in question is the same region which we previously treated what we thought would successfully back in November 2019. At that time the patient underwent IV antibiotic therapy which seemed to do the job very well. Subsequently however in the past couple of weeks he has  begun to have drainage and bleeding from the sacral region and is having increased pain yet again. Fortunately there is no signs of systemic infection although it does appear that the complex abscess that was previously noted may not have fully cleared there was a question between myself as well is infectious disease previous whether not he  needed to see a surgeon being that things got better the family opted not to see a surgeon at that time. Nonetheless I am concerned that he may the need to see a surgeon in order to have this area surgically debrided and possibly a bone culture obtained in order to get a better idea of what we're treating and ensure that this is able to completely and fully heal. 11/27/18 on evaluation today patient appears to be doing about the same in regard to his sacral ulcer. He did see Dr. Viviano Simas, [name] ([id number]) yesterday and the plan is to proceed forward with surgery to clean out the region. This seems to be the most appropriate goal of treatment at this point. Dr. Lysle Pearl just needed to speak with Dr. Ellene Route the patient's neurologist in order to get clearance as the patient was supposed to be scheduled for a carpal tunnel and elbow surgery with Dr. Ellene Route. Nonetheless he has apparently given clearance to proceed with this surgery for the sacral region which is deemed more important at this point. Unfortunately the patient had a little bit of increased pain although he is having redness at this point there does not appear to be any spreading infection which is good news. No fevers, chills, nausea, or vomiting noted at this time. Patient History Information obtained from Patient. Family History Cancer - Paternal Grandparents, Diabetes - Father, Heart Disease - Mother,Father, Stroke - Father, No family history of Hypertension, Kidney Disease, Lung Disease, Seizures, Thyroid Problems, Tuberculosis. Social History Never smoker, Marital Status - Widowed, Alcohol Use - Never, Drug Use - No History, Caffeine Use - Daily. Medical History Eyes Patient has history of Cataracts - bilateral removal Denies history of Glaucoma, Optic Neuritis Ear/Nose/Mouth/Throat Denies history of Chronic sinus problems/congestion, Middle ear problems Hematologic/Lymphatic Denies history of Anemia, Hemophilia, Human  Immunodeficiency Virus, Lymphedema, Sickle Cell Disease Respiratory Patient has history of Asthma Denies history of Aspiration, Chronic Obstructive Pulmonary Disease (COPD), Pneumothorax, Sleep Apnea, Tuberculosis Cardiovascular Patient has history of Angina, Arrhythmia, Coronary Artery Disease, Hypertension, Myocardial Infarction - 2001 Denies history of Congestive Heart Failure, Deep Vein Thrombosis, Hypotension, Peripheral Arterial Disease, Peripheral Venous Disease, Phlebitis, Vasculitis Gastrointestinal Denies history of Cirrhosis , Colitis, Crohn s, Hepatitis A, Hepatitis B, Hepatitis C Endocrine Denies history of Type I Diabetes, Type II Diabetes Genitourinary Denies history of End Stage Renal Disease Immunological Denies history of Lupus Erythematosus, Raynaud s, Scleroderma Integumentary (Skin) Denies history of History of Burn, History of pressure wounds Musculoskeletal Patient has history of Osteoarthritis Denies history of Gout, Rheumatoid Arthritis, Osteomyelitis Neurologic Patient has history of Dementia, Neuropathy Denies history of Quadriplegia, Paraplegia, Seizure Disorder Oncologic Denies history of Received Chemotherapy, Received Radiation Psychiatric Denies history of Anorexia/bulimia, Confinement Anxiety Hospitalization/Surgery History - 01/20/2018, [facility], [facility]. Medical And Surgical History Notes Endocrine Borderline Oncologic [name], [name] ([id number]) Melanoma on back Review of Systems (ROS) Constitutional Symptoms ([place]) Denies complaints or symptoms of Fever, Chills. Respiratory The patient has no complaints or symptoms. Cardiovascular The patient has no complaints or symptoms. Psychiatric The patient has no complaints or symptoms. Objective Constitutional Well-nourished and well-hydrated in no acute distress. Vitals Time Taken: 2:53  PM, Height: 69 in, Weight: 170.6 lbs, BMI: 25.2, Temperature: 97.6 F, Pulse: 76 bpm,  Respiratory Rate: 16 breaths/min, Blood Pressure: 143/69 mmHg. Respiratory normal breathing without difficulty. clear to auscultation bilaterally. Cardiovascular regular rate and rhythm with normal S1, S2. Psychiatric this patient is able to make decisions and demonstrates good insight into disease process. Alert and Oriented x 3. pleasant and cooperative. General Notes: Patient's wound bed currently appears to be really about the same as what it was last week when I saw him for evaluation. I see no evidence of worsening at this point which is good news. Integumentary (Hair, Skin) Wound #2 status is Open. Original cause of wound was Bump. The wound is located on the Midline Sacrum. The wound measures 1cm length x 0.5cm width x 0.1cm depth; 0.393cm^2 area and 0.039cm^3 volume. There is Fat Layer (Subcutaneous Tissue) Exposed exposed. There is no tunneling or undermining noted. There is a medium amount of serous drainage noted. The wound margin is indistinct and nonvisible. There is medium (34-66%) red granulation within the wound bed. There is no necrotic tissue within the wound bed. The periwound skin appearance exhibited: Erythema. The periwound skin appearance did not exhibit: Callus, Crepitus, Excoriation, Induration, Rash, Scarring, Dry/Scaly, Maceration, Atrophie Blanche, Cyanosis, Ecchymosis, Hemosiderin Staining, Mottled, Pallor, Rubor. The surrounding wound skin color is noted with erythema. Periwound temperature was noted as No Abnormality. The periwound has tenderness on palpation. Assessment [name], [name] ([id number]) Active Problems ICD-10 Pressure ulcer of sacral region, stage 4 Cutaneous abscess of buttock Plan Wound Cleansing: Wound #2 Midline Sacrum: Clean wound with Normal Saline. May Shower, gently pat wound dry prior to applying new dressing. Anesthetic (add to Medication List): Wound #2 Midline Sacrum: Topical Lidocaine 4% cream applied to wound bed prior to  debridement (In Clinic Only). Primary Wound Dressing: Wound #2 Midline Sacrum: Silver Alginate Secondary Dressing: Wound #2 Midline Sacrum: Boardered Foam Dressing Dressing Change Frequency: Wound #2 Midline Sacrum: Change dressing every other day. Follow-up Appointments: Wound #2 Midline Sacrum: Return Appointment in 1 week. My suggestion currently is gonna be that we continue with the Current wound care measures per above for the next week. I'll plan to see him at that point although obviously she's having surgery I will not necessarily need to keep that appointment we still see how things are doing at that time and where everything stands. If anything changes or worsens in the meantime patient's family will contact the office and let me know. Please see above for specific wound care orders. We will see patient for re-evaluation in 1 week(s) here in the clinic. If anything worsens or changes patient will contact our office for additional recommendations. Electronic Signature(s) Signed: 12/02/2018 7:15:48 AM By: Worthy Keeler PA-C Entered By: Worthy Keeler on 12/01/2018 00:48:22 [name] ([id number]) -------------------------------------------------------------------------------- ROS/PFSH Details Patient Name: [name] Date of Service: 11/27/2018 2:30 PM Medical Record Number: [id number] Patient Account Number: [id number] Date of Birth/Sex: Sep 27, 1931 (85 y.o. M) Treating RN: Montey Hora Primary Care Provider: Burman Freestone Other Clinician: Referring Provider: Burman Freestone Treating Provider/Extender: [name], Basil Blakesley Weeks in Treatment: 1 Information Obtained From Patient Wound History Do you currently have one or more open woundso Yes How many open wounds do you currently haveo 1 Approximately how long have you had your woundso 2 or 3 weeks How have you been treating your wound(s) until nowo bordered foam dressing Has your wound(s) ever healed and  then re-openedo Yes Have you had any lab work  done in the past montho No Have you tested positive for an antibiotic resistant organism (MRSA, VRE)o Yes Have you tested positive for osteomyelitis (bone infection)o No Have you had any tests for circulation on your legso No Constitutional Symptoms (General Health) Complaints and Symptoms: Negative for: Fever; Chills Eyes Medical History: Positive for: Cataracts - bilateral removal Negative for: Glaucoma; Optic Neuritis Ear/Nose/Mouth/Throat Medical History: Negative for: Chronic sinus problems/congestion; Middle ear problems Hematologic/Lymphatic Medical History: Negative for: Anemia; Hemophilia; Human Immunodeficiency Virus; Lymphedema; Sickle Cell Disease Respiratory Complaints and Symptoms: No Complaints or Symptoms Medical History: Positive for: Asthma Negative for: Aspiration; Chronic Obstructive Pulmonary Disease (COPD); Pneumothorax; Sleep Apnea; Tuberculosis Cardiovascular Complaints and Symptoms: No Complaints or Symptoms Medical HistoryMarland Kitchen ZACKERIAH, KISSLER ([id number]) Positive for: Angina; Arrhythmia; Coronary Artery Disease; Hypertension; Myocardial Infarction - 2001 Negative for: Congestive Heart Failure; Deep Vein Thrombosis; Hypotension; Peripheral Arterial Disease; Peripheral Venous Disease; Phlebitis; Vasculitis Gastrointestinal Medical History: Negative for: Cirrhosis ; Colitis; Crohnos; Hepatitis A; Hepatitis B; Hepatitis C Endocrine Medical History: Negative for: Type I Diabetes; Type II Diabetes Past Medical History Notes: Borderline Genitourinary Medical History: Negative for: End Stage Renal Disease Immunological Medical History: Negative for: Lupus Erythematosus; Raynaudos; Scleroderma Integumentary (Skin) Medical History: Negative for: History of Burn; History of pressure wounds Musculoskeletal Medical History: Positive for: Osteoarthritis Negative for: Gout; Rheumatoid Arthritis;  Osteomyelitis Neurologic Medical History: Positive for: Dementia; Neuropathy Negative for: Quadriplegia; Paraplegia; Seizure Disorder Oncologic Medical History: Negative for: Received Chemotherapy; Received Radiation Past Medical History Notes: Melanoma on back Psychiatric Complaints and Symptoms: No Complaints or Symptoms Medical History: Negative for: Anorexia/bulimia; Confinement Anxiety HBO Extended History Items FAIZON, CAPOZZI ([id number]) Eyes: Cataracts Immunizations Pneumococcal Vaccine: Received Pneumococcal Vaccination: Yes Implantable Devices Hospitalization / Surgery History Name of Hospital Purpose of Hospitalization/Surgery Date [facility] 01/20/2018 Family and Social History Cancer: Yes - Paternal Grandparents; Diabetes: Yes - Father; Heart Disease: Yes - Mother,Father; Hypertension: No; Kidney Disease: No; Lung Disease: No; Seizures: No; Stroke: Yes - Father; Thyroid Problems: No; Tuberculosis: No; Never smoker; Marital Status - Widowed; Alcohol Use: Never; Drug Use: No History; Caffeine Use: Daily; Financial Concerns: No; Food, Clothing or Shelter Needs: No; Support System Lacking: No; Transportation Concerns: No; Advanced Directives: Yes (Not Provided); Patient does not want information on Advanced Directives; Do not resuscitate: Yes (Not Provided); Living Will: Yes (Not Provided); Medical Power of Attorney: Yes - Darald Uzzle (Not Provided) Physician Affirmation I have reviewed and agree with the above information. Electronic Signature(s) Signed: 12/02/2018 7:15:48 AM By: Worthy Keeler PA-C Signed: 12/24/2018 10:44:46 AM By: Montey Hora Entered By: Worthy Keeler on 12/01/2018 00:48:01 [name] ([id number]) -------------------------------------------------------------------------------- SuperBill Details Patient Name: [name] Date of Service: 11/27/2018 Medical Record Number: [id number] Patient Account Number: [id number] Date of  Birth/Sex: 31-May-1931 (85 y.o. M) Treating RN: Montey Hora Primary Care Provider: Burman Freestone Other Clinician: Referring Provider: Burman Freestone Treating Provider/Extender: [name], Keyonna [place] Weeks in Treatment: 1 Diagnosis Coding ICD-10 Codes Code Description L89.154 Pressure ulcer of sacral region, stage 4 L02.31 Cutaneous abscess of buttock Facility Procedures CPT4 Code: [id number] Description: 99213 - WOUND CARE VISIT-LEV 3 EST PT Modifier: Quantity: 1 Physician Procedures CPT4 Code: [id number] Description: 81017 - WC PHYS LEVEL 4 - EST PT ICD-10 Diagnosis Description L89.154 Pressure ulcer of sacral region, stage 4 L02.31 Cutaneous abscess of buttock Modifier: Quantity: 1 Electronic Signature(s) Signed: 12/02/2018 7:15:48 AM By: Worthy Keeler PA-C Entered By: Worthy Keeler on 11/27/2018 51:02:58

## 2018-12-25 NOTE — Progress Notes (Signed)
ELIODORO, GULLETT ([id number]) Visit Report for 12/24/2018 Chief Complaint Document Details Patient Name: [name], [name]. Date of Service: 12/24/2018 2:00 PM Medical Record Number: [id number] Patient Account Number: [id number] Date of Birth/Sex: [birth year] (84 y.o. M) Treating RN: Harold Barban Primary Care Provider: Burman Freestone Other Clinician: Referring Provider: Burman Freestone Treating Provider/Extender: [name], HOYT Weeks in Treatment: 4 Information Obtained from: Patient Chief Complaint Sacral pressure ulcer Electronic Signature(s) Signed: 12/24/2018 4:30:45 PM By: Worthy Keeler PA-C Entered By: Worthy Keeler on 12/24/2018 14:20:19 [name] ([id number]) -------------------------------------------------------------------------------- HPI Details Patient Name: [name] Date of Service: 12/24/2018 2:00 PM Medical Record Number: [id number] Patient Account Number: [id number] Date of Birth/Sex: Jul 16, 1931 (85 y.o. M) Treating RN: Harold Barban Primary Care Provider: Burman Freestone Other Clinician: Referring Provider: Burman Freestone Treating Provider/Extender: [name], HOYT Weeks in Treatment: 4 History of Present Illness HPI Description: 02/06/18 on evaluation today patient presents for initial evaluation and our clinic concerning an issue which began roughly 3 weeks ago when the patient fell in his home on the floor in his kitchen and laid him down this detergent for roughly 3 days. He had a pressure injury to the left shoulder. This unfortunately has caused him a lot of discomfort although it finally seems to be doing better if anything is really having a lot of itching right now. This appears potentially be a contact dermatitis issue. He also has a significant pressure injury to the sacrum at this time as well which is also showing fascia exposure right over the bone but no evidence of bone exposure at this point which is good news. They have been  using Santyl as well as Saline soaked gauze at this point in time. There does appear to be a lot of necrotic slough in the base of the wound. He does have a history of incontinence, myocardial infarction, and hypertension. He also is "borderline diabetic" hemoglobin A1c of 6.0. Currently he has some discomfort in the pressure site at the sacrum but fortunately nothing too significant this did require sharp debridement today. 02/13/18 on evaluation today patient appears to be doing much better in regard to his sacral wound. He has been tolerating the dressing changes without complication with the Vashe. Fortunately there is no evidence of infection and though there is some Slough on the surface of the wound bed he has excellent granulation noted. Overall I'm pleased with how things have progressed in that regard. A glance at his shoulder as well and the rash seems to be someone improving in my pinion at this site as well. Overall I am pleased with what we're seeing and so is the family. 02/20/18 on evaluation today patient appears to be doing a little worse in regard to the sacral wound only in the fact that there is redness surrounding it has me somewhat concerned for infection. The drainage has also apparently been a little bit off color compared to normal according to family they did keep the dressing today that was removed to show me and I agree this seems to be a little bit different compared to what we have been seeing. Coupled with the redness I'm concerned he may be developing some cellulitis surrounding the wound bed. 02/27/18 on evaluation today patient presents for follow-up concerning his sacral ulcer. We have received the results back from his wound culture which shows unfortunately that the doxycycline will not be of benefit for him I am going to need to initiate treatment with something else  in order to treat the pseudomonas. Otherwise he does not seem to be having any significant pain  although his daughter states there are sometimes when he states having pain. We continue to use the Vashe currently. 03/06/18 on evaluation today patient's sacral wound appears to be doing better in my opinion. He has been tolerating the dressing changes without complication. With that being said the silver nitrate has helped with the prominent area of hyper granulation at the 6 o'clock location we will likely need to repeat this again today. Nonetheless overall I am pleased with how things have improved over the last week. The erythema surrounding the wound seems to be greatly improved. 03/13/18 on evaluation today patient's wound actually does not appear to be terribly infected although she does continue to have erythema surrounding the wound bed especially on the left border. I'm still somewhat concerned about the fact that the oral antibiotics alone may not be completely treating his infection. I previously discussed may need to go for IV antibiotic therapy I'm concerned that may be the case. We will need to make a referral today for infectious disease. 03/20/18 on evaluation today the patient sacral wound actually appears to be doing fairly well in regard to granulation although he continues unfortunately to have it your theme is surrounding the periwound region. There's also some increased swelling at the 6 o'clock location which also has me somewhat concerned. With that being said he does have some discomfort but nothing too significant at this point. He still has not heard from infectious disease his daughter and wife are both present during the office visit today they're going to check back with this again. We did get the information for them to call them today. 03/27/18 on evaluation today patient is seen concerning his ongoing sacral ulcer. He has been tolerating the dressing changes without complication. With that being said he does present with evidence of bright green drainage noted on the  dressing which again is something that I do often expect to see with a pseudomonas infection. He continues to have your theme is ACETON, KINNEAR ([id number]) surrounding the wound bed as well and again I'm not 100% convinced this is just pressure related. I did speak with Colletta Maryland who is the nurse practitioner in [place] with infectious disease. I spoke with her actually yesterday concerning this patient. She is not 100% convinced that this is infected. She question whether the wound may just be colonized with Pseudomonas and not actually causing an active infection. I am really not thinking that the edema is associated with pressure alone and again overall I don't feel that your theme and is consistent with a pressure injury either as he's never had any contusions noted like a deep tissue injury on his heel which was new and I did visualize today this was on the left heel. Nonetheless she wanted to give this a little bit more time and thought it would be appropriate to start the Wound VAC at this point. 04/03/18 on evaluation today patient sacral ulcer actually appears to be doing fairly well at this point. He has been tolerating the dressing changes without complication. With that being said I'm very pleased with the progress that has been made in regard to his sacral wound over the past week I do not see as much in the way of erythema which is great news. Nonetheless he does have a small area of hyper granulation unfortunately. This is at roughly the 7 o'clock location and I  think does need to be addressed so that this will heal more appropriately. Nonetheless I think we may be ready to go ahead and initiate therapy with the Wound VAC. 04/10/18 on evaluation today patient appears to be doing excellent in regard to his sacral ulcer. The show signs of great improvement in overall I'm very pleased with how things look. He has been tolerating the dressing changes without complication.  Specifically this is the Wound VAC. He also seems to be doing well with the antibiotic there is decreased your theme and redness surrounding the sacral area at this point in the wound has filled in quite significantly. 04/17/18 on evaluation today patient actually appears to be doing excellent in regard to his sacral ulcer. He's been tolerating the dressing changes without complication specifically the Wound VAC. There really are no major concerns from the patient nor family this point he is having no pain. He does have a little bit of Epiboly on the lateral portions of the wound where he does have a little bit more depth that will need to be addressed today. 04/23/18 on evaluation today patient's wound actually appears to be doing excellent at this point. He has been tolerating the Wound VAC and this appears to be doing well other than the fact that it seems to be breaking seal at the 6 o'clock location. I do believe that adding a duodenum dressing at this location try and help maintain the seal would be appropriate and likely very effective. With that being said he overall seems to be showing signs of good improvement at this point. There does not appear to be any evidence of significant infection which is also excellent news. 04/30/18 on evaluation today patient actually appears to be doing well in regard to his sacral ulcer. He's been tolerating the dressing changes without complication. Fortunately there does not appear to be any evidence of infection. Overall I'm very pleased with the progress that has been made up to this point. He does have some blistering underneath the draping unfortunately although this is definitely something that has been noted on other patients previously is a fairly common occurrence. Nonetheless the patient seems to be doing fairly well in general in my pinion based on what I see at this time. I do believe these are fairly superficial and minor. 05/07/18 on evaluation  today patient's wound actually appears to be doing excellent at this point in time. He has been tolerating the Wound VAC decently well he states that it is somewhat cumbersome to carry around unfortunately. The only other issue he's been having according to family is that they been having a difficult time keeping the Wound VAC in place and doing what is supposed to do without making. Obviously I do think that this is definitely of concern. Nonetheless I do believe she's made good progress up to this point. I'm very happy in that regard. 05/14/18 on evaluation today patient's wound continues to make good progress at this point. He had a minimal amount of slough noted on the surface which was easily wiped away with saline and gauze and in general I feel like that he is continuing to show excellent progress even with the discontinuation of the Wound VAC. Overall I'm pleased in this regard. He was having issues with the Wound VAC in getting it to seal I think that using the Prisma at this time has been equally efficient and getting the wound to diminish in size. 05/29/18- He is here in follow up  evaluation for a sacral ulcer. There is improvement, we will continue with prisma and he will follow up in two weeks 06/11/18 on evaluation today patient had unfortunately bright green drainage on the dressing upon evaluation today. This is something that we have encountered before although we were able to get things under control previously with antibiotics. With that being said currently upon further inspection of the three areas of hyper granulation that were separate from the actual wound itself which was almost healed it really appears that these all have some depth to them. They are more tunnels that really have not closed or at least have reopened as a result likely of infection in my pinion. This is definitely not what I was expecting or hoping for. HARVY, RIERA ([id number]) 06/26/18 on evaluation  today patient continues to experience issues with what appears to be small abscesses in the sacral region unfortunately. With that being said he has been tolerating the dressing changes without complication which is good news. He's not having any significant discomfort which is also good news. With that being said his daughter states that after he left last week that the packing that we have placed fell out quite rapidly and he subsequently healed over very quick to the point they were not able to even repack the regions. Nonetheless there appear to be several fluctuance areas noted at this point there's one central region that does seem to be draining still discharge that is somewhat green in color. I did review the results of the wound culture which did show evidence of infection with both MRSA as well as pseudomonas based on that result. Nonetheless again with his other current medications we are not able to do the Cipro due to issues with potential long QT syndrome. I am going to give him a prescription for doxycycline in order to help with the potential MRSA infection. 07/09/18 on evaluation today patient actually appears to be doing about the same in regard to his sacral wound. He actually has his MRI scheduled for tomorrow and then subsequently is going to be having his infectious disease appointment for Thursday of this week. Fortunately he's not having any significant discomfort he still has your theme in the sacral region he still has several blister/flux went areas although they technically are not blisters this is more like a underlying abscess. The one area that is open still does probe down to bone. Again I am concerned about a deeper infection possibly even sacral osteomyelitis. 07/23/18 on evaluation today patient actually appears to be doing very well all things considered in regard to his sacral ulcer. Since I've last seen him he actually did have his MRI performed which showed that  he has a complex fluid collection superficial to the distal sacrum measuring 5.9 x 4.4 x 2.8 cm which abuts the posterior aspect of the distal sacrum and the sacrum itself shows cortical destruction consistent with osteomyelitis. She has also been seen by infectious disease and currently orders have been initiated for IV antibiotic therapy for the next eight weeks. He was seen by Janene Madeira NP and placed on Ceftazidime and Daptomycin. Currently he has not really been on this quite long enough to see a sufficient response to the new orders as far as antibiotic therapy is concerned. Nonetheless it does appear that he is likely on the right track at this point which is great news. Nonetheless the question which both Colletta Maryland and myself have discussed both with the patient and between  ourselves is whether or not the patient may need to be seen by surgeon for surgical evacuation of the fluid collection/abscess and possible debridement of the sacrum itself. Nonetheless at this time my personal opinion is probably gonna be that we wait and get this at least a couple weeks to see the response that he receives with the IV antibiotic therapy. 08/06/18 on evaluation today patient actually appears to be doing rather well in regard to his sacral ulcer region all things considered. He has been tolerating the dressing changes without complication. With that being said there is not any obvious opening nor any drainage noted at this point in time upon evaluation. The patient has been tolerating the dressing changes though. He's doing well with the IV antibiotic therapy. 08/20/18 on evaluation today patient appears to be doing wonderful in regard to his sacral region. In fact there appears to be no wound opening or drainage at this time. Overall I'm very pleased with how things have progressed. The patient likewise as well as his wife and daughter are very happy as well. Readmission: 11/20/18 on evaluation today  patient presents for reevaluation our clinic concerning issues with his sacral region. Unfortunately the area in question is the same region which we previously treated what we thought would successfully back in November 2019. At that time the patient underwent IV antibiotic therapy which seemed to do the job very well. Subsequently however in the past couple of weeks he has begun to have drainage and bleeding from the sacral region and is having increased pain yet again. Fortunately there is no signs of systemic infection although it does appear that the complex abscess that was previously noted may not have fully cleared there was a question between myself as well is infectious disease previous whether not he needed to see a surgeon being that things got better the family opted not to see a surgeon at that time. Nonetheless I am concerned that he may the need to see a surgeon in order to have this area surgically debrided and possibly a bone culture obtained in order to get a better idea of what we're treating and ensure that this is able to completely and fully heal. 11/27/18 on evaluation today patient appears to be doing about the same in regard to his sacral ulcer. He did see Dr. Lysle Pearl yesterday and the plan is to proceed forward with surgery to clean out the region. This seems to be the most appropriate goal of treatment at this point. Dr. Lysle Pearl just needed to speak with Dr. Ellene Route the patient's neurologist in order to get clearance as the patient was supposed to be scheduled for a carpal tunnel and elbow surgery with Dr. Ellene Route. Nonetheless he has apparently given clearance to proceed with this surgery for the sacral region which is deemed more important at this point. Unfortunately the patient had a little bit of increased pain although he is having redness at this point there does not appear to be any spreading infection which is good news. No fevers, chills, nausea, or vomiting noted at  this time. BRUCE, CHURILLA ([id number]) 12/03/18 on evaluation today patient actually appears to be doing about the same in regard to the sacral ulcer. He still waiting to hear back from Dr. Ines Bloomer office in regard to scheduling his surgery. Fortunately there's no signs of systemic infection at this time which is good news. Unfortunately he really does not seem to making any progress the doxycycline does seem to at least  be beneficial in helping to hold off the infection from worsening although unfortunately he still has a lot going on in this regard. Patient's daughter states she's actually gonna contact her office tomorrow to see what exactly is going on. 12/10/18 on evaluation today patient actually appears to be doing about the same in regard to her sacral room. Apparently he still waiting on approval from both cardiology as well as Dr. Ellene Route although apparently he's Artie gotten a formal letter from Dr. Ellene Route stated that he was clear to proceed with the sacral surgery. Di Kindle actually found the clearance letter from cardiology in epic as well today and this subsequently was given to the patient's daughter as that seems to be the only thing that was holding up proceeding with the surgery. Hopefully should be able to take that over to the surgeon's office today in order to go ahead and get this scheduled. 12/24/18 on evaluation today patient actually appears to be doing very well in regard to his sacral ulcer compared to when I last saw him. He has had surgery Dr. Lysle Pearl and it does appear that he was able to clear out this area of abscess very well without complication. This did extend down to the bone and a portion of the bone was sent for pathology and culture. Although on the pathology report it appears this is more tissue and not bone noted. With that being said the culture from the bone and Henrene Pastor asked him this was obtained revealed Corynebacterium striatum with no anaerobes isolated. Dr.  Lysle Pearl did not place the patient on the antibiotics at this point. Again I have been debating on whether or not this is the patient to infectious disease I think that I am going to do that at this point to gain their opinion on whether or not there's anything we should be treated in this regard and if so will be the best treatment options. Electronic Signature(s) Signed: 12/24/2018 4:30:45 PM By: Worthy Keeler PA-C Entered By: Worthy Keeler on 12/24/2018 16:28:55 [name] ([id number]) -------------------------------------------------------------------------------- Physical Exam Details Patient Name: KYRIE, FLUDD Date of Service: 12/24/2018 2:00 PM Medical Record Number: [id number] Patient Account Number: [id number] Date of Birth/Sex: [birth year] (85 y.o. M) Treating RN: Harold Barban Primary Care Provider: Burman Freestone Other Clinician: Referring Provider: Burman Freestone Treating Provider/Extender: STONE III, HOYT Weeks in Treatment: 4 Constitutional Well-nourished and well-hydrated in no acute distress. Respiratory normal breathing without difficulty. clear to auscultation bilaterally. Cardiovascular regular rate and rhythm with normal S1, S2. Psychiatric this patient is able to make decisions and demonstrates good insight into disease process. Alert and Oriented x 3. pleasant and cooperative. Notes Upon evaluation today patient's wound that obviously is much more open following the surgery that appears to be doing very well as far as the overall appearance of the wound that is concerned. He does have some Slough noted on the surface of the wound there's also some erythema surrounding but fortunately there were no tunnels or sinus track extending to the other location which is great news. Electronic Signature(s) Signed: 12/24/2018 4:30:45 PM By: Worthy Keeler PA-C Entered By: Worthy Keeler on 12/24/2018 16:29:29 [name]  ([id number]) -------------------------------------------------------------------------------- Physician Orders Details Patient Name: TREVAUN, RENDLEMAN Date of Service: 12/24/2018 2:00 PM Medical Record Number: [id number] Patient Account Number: [id number] Date of Birth/Sex: 08/18/1931 (85 y.o. M) Treating RN: Harold Barban Primary Care Provider: Burman Freestone Other Clinician: Referring Provider: Burman Freestone Treating Provider/Extender: [name], HOYT Weeks  in Treatment: 4 Verbal / Phone Orders: No Diagnosis Coding ICD-10 Coding Code Description L89.154 Pressure ulcer of sacral region, stage 4 L02.31 Cutaneous abscess of buttock Wound Cleansing Wound #2 Midline Sacrum o Clean wound with Normal Saline. o May Shower, gently pat wound dry prior to applying new dressing. Anesthetic (add to Medication List) Wound #2 Midline Sacrum o Topical Lidocaine 4% cream applied to wound bed prior to debridement (In Clinic Only). Primary Wound Dressing Wound #2 Midline Sacrum o Other: - Dakins moisten gauze Secondary Dressing Wound #2 Midline Sacrum o ABD pad o XtraSorb Dressing Change Frequency Wound #2 Midline Sacrum o Change dressing every day. Follow-up Appointments Wound #2 Midline Sacrum o Return Appointment in 1 week. Off-Loading Wound #2 Midline Sacrum o Turn and reposition every 2 hours Home Health Wound #2 [place] [facility] - Dressing change in office on Mondays o [place] Nurse may visit PRN to address patientos wound care needs. ALIZE, ACY ([id number]) o FACE TO FACE ENCOUNTER: MEDICARE and MEDICAID PATIENTS: I certify that this patient is under my care and that I had a face-to-face encounter that meets the physician face-to-face encounter requirements with this patient on this date. The encounter with the patient was in whole or in part for the following  MEDICAL CONDITION: (primary reason for [facility]) MEDICAL NECESSITY: I certify, that based on my findings, NURSING services are a medically necessary home health service. HOME BOUND STATUS: I certify that my clinical findings support that this patient is homebound (i.e., Due to illness or injury, pt requires aid of supportive devices such as crutches, cane, wheelchairs, walkers, the use of special transportation or the assistance of another person to leave their place of residence. There is a normal inability to leave the home and doing so requires considerable and taxing effort. Other absences are for medical reasons / religious services and are infrequent or of short duration when for other reasons). o If current dressing causes regression in wound condition, may D/C ordered dressing product/s and apply Normal Saline Moist Dressing daily until next [place] / Other MD appointment. [place] of regression in wound condition at [phone]. o Please direct any NON-WOUND related issues/requests for orders to patient's Primary Care Physician Consults o Infectious Disease Electronic Signature(s) Signed: 12/24/2018 4:30:45 PM By: Worthy Keeler PA-C Signed: 12/24/2018 5:00:09 PM By: Harold Barban Entered By: Harold Barban on 12/24/2018 15:27:13 [name] ([id number]) -------------------------------------------------------------------------------- Problem List Details Patient Name: TODD, JELINSKI. Date of Service: 12/24/2018 2:00 PM Medical Record Number: [id number] Patient Account Number: [id number] Date of Birth/Sex: [birth year] (85 y.o. M) Treating RN: Harold Barban Primary Care Provider: Burman Freestone Other Clinician: Referring Provider: Burman Freestone Treating Provider/Extender: [name], HOYT Weeks in Treatment: 4 Active Problems ICD-10 Evaluated Encounter Code Description Active Date Today Diagnosis L89.154 Pressure ulcer of  sacral region, stage 4 11/20/2018 No Yes L02.31 Cutaneous abscess of buttock 11/20/2018 No Yes Inactive Problems Resolved Problems Electronic Signature(s) Signed: 12/24/2018 4:30:45 PM By: Worthy Keeler PA-C Entered By: Worthy Keeler on 12/24/2018 14:20:14 [name] ([id number]) -------------------------------------------------------------------------------- Progress Note Details Patient Name: [name] Date of Service: 12/24/2018 2:00 PM Medical Record Number: [id number] Patient Account Number: [id number] Date of Birth/Sex: 1931/03/20 (85 y.o. M) Treating RN: Harold Barban Primary Care Provider: Burman Freestone Other Clinician: Referring Provider: Burman Freestone Treating Provider/Extender: [name], HOYT Weeks in Treatment: 4 Subjective Chief Complaint Information  obtained from Patient Sacral pressure ulcer History of Present Illness (HPI) 02/06/18 on evaluation today patient presents for initial evaluation and our clinic concerning an issue which began roughly 3 weeks ago when the patient fell in his home on the floor in his kitchen and laid him down this detergent for roughly 3 days. He had a pressure injury to the left shoulder. This unfortunately has caused him a lot of discomfort although it finally seems to be doing better if anything is really having a lot of itching right now. This appears potentially be a contact dermatitis issue. He also has a significant pressure injury to the sacrum at this time as well which is also showing fascia exposure right over the bone but no evidence of bone exposure at this point which is good news. They have been using Santyl as well as Saline soaked gauze at this point in time. There does appear to be a lot of necrotic slough in the base of the wound. He does have a history of incontinence, myocardial infarction, and hypertension. He also is "borderline diabetic" hemoglobin A1c of 6.0. Currently he has some discomfort in the  pressure site at the sacrum but fortunately nothing too significant this did require sharp debridement today. 02/13/18 on evaluation today patient appears to be doing much better in regard to his sacral wound. He has been tolerating the dressing changes without complication with the Vashe. Fortunately there is no evidence of infection and though there is some Slough on the surface of the wound bed he has excellent granulation noted. Overall I'm pleased with how things have progressed in that regard. A glance at his shoulder as well and the rash seems to be someone improving in my pinion at this site as well. Overall I am pleased with what we're seeing and so is the family. 02/20/18 on evaluation today patient appears to be doing a little worse in regard to the sacral wound only in the fact that there is redness surrounding it has me somewhat concerned for infection. The drainage has also apparently been a little bit off color compared to normal according to family they did keep the dressing today that was removed to show me and I agree this seems to be a little bit different compared to what we have been seeing. Coupled with the redness I'm concerned he may be developing some cellulitis surrounding the wound bed. 02/27/18 on evaluation today patient presents for follow-up concerning his sacral ulcer. We have received the results back from his wound culture which shows unfortunately that the doxycycline will not be of benefit for him I am going to need to initiate treatment with something else in order to treat the pseudomonas. Otherwise he does not seem to be having any significant pain although his daughter states there are sometimes when he states having pain. We continue to use the Vashe currently. 03/06/18 on evaluation today patient's sacral wound appears to be doing better in my opinion. He has been tolerating the dressing changes without complication. With that being said the silver nitrate has  helped with the prominent area of hyper granulation at the 6 o'clock location we will likely need to repeat this again today. Nonetheless overall I am pleased with how things have improved over the last week. The erythema surrounding the wound seems to be greatly improved. 03/13/18 on evaluation today patient's wound actually does not appear to be terribly infected although she does continue to have erythema surrounding the wound bed especially on  the left border. I'm still somewhat concerned about the fact that the oral antibiotics alone may not be completely treating his infection. I previously discussed may need to go for IV antibiotic therapy I'm concerned that may be the case. We will need to make a referral today for infectious disease. 03/20/18 on evaluation today the patient sacral wound actually appears to be doing fairly well in regard to granulation although he continues unfortunately to have it your theme is surrounding the periwound region. There's also some increased swelling at the 6 o'clock location which also has me somewhat concerned. With that being said he does have some discomfort but SEARCY, MIYOSHI. ([id number]) nothing too significant at this point. He still has not heard from infectious disease his daughter and wife are both present during the office visit today they're going to check back with this again. We did get the information for them to call them today. 03/27/18 on evaluation today patient is seen concerning his ongoing sacral ulcer. He has been tolerating the dressing changes without complication. With that being said he does present with evidence of bright green drainage noted on the dressing which again is something that I do often expect to see with a pseudomonas infection. He continues to have your theme is surrounding the wound bed as well and again I'm not 100% convinced this is just pressure related. I did speak with Colletta Maryland who is the nurse practitioner in  [place] with infectious disease. I spoke with her actually yesterday concerning this patient. She is not 100% convinced that this is infected. She question whether the wound may just be colonized with Pseudomonas and not actually causing an active infection. I am really not thinking that the edema is associated with pressure alone and again overall I don't feel that your theme and is consistent with a pressure injury either as he's never had any contusions noted like a deep tissue injury on his heel which was new and I did visualize today this was on the left heel. Nonetheless she wanted to give this a little bit more time and thought it would be appropriate to start the Wound VAC at this point. 04/03/18 on evaluation today patient sacral ulcer actually appears to be doing fairly well at this point. He has been tolerating the dressing changes without complication. With that being said I'm very pleased with the progress that has been made in regard to his sacral wound over the past week I do not see as much in the way of erythema which is great news. Nonetheless he does have a small area of hyper granulation unfortunately. This is at roughly the 7 o'clock location and I think does need to be addressed so that this will heal more appropriately. Nonetheless I think we may be ready to go ahead and initiate therapy with the Wound VAC. 04/10/18 on evaluation today patient appears to be doing excellent in regard to his sacral ulcer. The show signs of great improvement in overall I'm very pleased with how things look. He has been tolerating the dressing changes without complication. Specifically this is the Wound VAC. He also seems to be doing well with the antibiotic there is decreased your theme and redness surrounding the sacral area at this point in the wound has filled in quite significantly. 04/17/18 on evaluation today patient actually appears to be doing excellent in regard to his sacral ulcer. He's  been tolerating the dressing changes without complication specifically the Wound VAC. There really are no  major concerns from the patient nor family this point he is having no pain. He does have a little bit of Epiboly on the lateral portions of the wound where he does have a little bit more depth that will need to be addressed today. 04/23/18 on evaluation today patient's wound actually appears to be doing excellent at this point. He has been tolerating the Wound VAC and this appears to be doing well other than the fact that it seems to be breaking seal at the 6 o'clock location. I do believe that adding a duodenum dressing at this location try and help maintain the seal would be appropriate and likely very effective. With that being said he overall seems to be showing signs of good improvement at this point. There does not appear to be any evidence of significant infection which is also excellent news. 04/30/18 on evaluation today patient actually appears to be doing well in regard to his sacral ulcer. He's been tolerating the dressing changes without complication. Fortunately there does not appear to be any evidence of infection. Overall I'm very pleased with the progress that has been made up to this point. He does have some blistering underneath the draping unfortunately although this is definitely something that has been noted on other patients previously is a fairly common occurrence. Nonetheless the patient seems to be doing fairly well in general in my pinion based on what I see at this time. I do believe these are fairly superficial and minor. 05/07/18 on evaluation today patient's wound actually appears to be doing excellent at this point in time. He has been tolerating the Wound VAC decently well he states that it is somewhat cumbersome to carry around unfortunately. The only other issue he's been having according to family is that they been having a difficult time keeping the Wound VAC in  place and doing what is supposed to do without making. Obviously I do think that this is definitely of concern. Nonetheless I do believe she's made good progress up to this point. I'm very happy in that regard. 05/14/18 on evaluation today patient's wound continues to make good progress at this point. He had a minimal amount of slough noted on the surface which was easily wiped away with saline and gauze and in general I feel like that he is continuing to show excellent progress even with the discontinuation of the Wound VAC. Overall I'm pleased in this regard. He was having issues with the Wound VAC in getting it to seal I think that using the Prisma at this time has been equally efficient and getting the wound to diminish in size. 05/29/18- He is here in follow up evaluation for a sacral ulcer. There is improvement, we will continue with prisma and he will follow up in two weeks JAKSEN, FIORELLA ([id number]) 06/11/18 on evaluation today patient had unfortunately bright green drainage on the dressing upon evaluation today. This is something that we have encountered before although we were able to get things under control previously with antibiotics. With that being said currently upon further inspection of the three areas of hyper granulation that were separate from the actual wound itself which was almost healed it really appears that these all have some depth to them. They are more tunnels that really have not closed or at least have reopened as a result likely of infection in my pinion. This is definitely not what I was expecting or hoping for. 06/26/18 on evaluation today patient continues to experience issues  with what appears to be small abscesses in the sacral region unfortunately. With that being said he has been tolerating the dressing changes without complication which is good news. He's not having any significant discomfort which is also good news. With that being said his daughter states  that after he left last week that the packing that we have placed fell out quite rapidly and he subsequently healed over very quick to the point they were not able to even repack the regions. Nonetheless there appear to be several fluctuance areas noted at this point there's one central region that does seem to be draining still discharge that is somewhat green in color. I did review the results of the wound culture which did show evidence of infection with both MRSA as well as pseudomonas based on that result. Nonetheless again with his other current medications we are not able to do the Cipro due to issues with potential long QT syndrome. I am going to give him a prescription for doxycycline in order to help with the potential MRSA infection. 07/09/18 on evaluation today patient actually appears to be doing about the same in regard to his sacral wound. He actually has his MRI scheduled for tomorrow and then subsequently is going to be having his infectious disease appointment for Thursday of this week. Fortunately he's not having any significant discomfort he still has your theme in the sacral region he still has several blister/flux went areas although they technically are not blisters this is more like a underlying abscess. The one area that is open still does probe down to bone. Again I am concerned about a deeper infection possibly even sacral osteomyelitis. 07/23/18 on evaluation today patient actually appears to be doing very well all things considered in regard to his sacral ulcer. Since I've last seen him he actually did have his MRI performed which showed that he has a complex fluid collection superficial to the distal sacrum measuring 5.9 x 4.4 x 2.8 cm which abuts the posterior aspect of the distal sacrum and the sacrum itself shows cortical destruction consistent with osteomyelitis. She has also been seen by infectious disease and currently orders have been initiated for IV antibiotic  therapy for the next eight weeks. He was seen by Janene Madeira NP and placed on Ceftazidime and Daptomycin. Currently he has not really been on this quite long enough to see a sufficient response to the new orders as far as antibiotic therapy is concerned. Nonetheless it does appear that he is likely on the right track at this point which is great news. Nonetheless the question which both Colletta Maryland and myself have discussed both with the patient and between ourselves is whether or not the patient may need to be seen by surgeon for surgical evacuation of the fluid collection/abscess and possible debridement of the sacrum itself. Nonetheless at this time my personal opinion is probably gonna be that we wait and get this at least a couple weeks to see the response that he receives with the IV antibiotic therapy. 08/06/18 on evaluation today patient actually appears to be doing rather well in regard to his sacral ulcer region all things considered. He has been tolerating the dressing changes without complication. With that being said there is not any obvious opening nor any drainage noted at this point in time upon evaluation. The patient has been tolerating the dressing changes though. He's doing well with the IV antibiotic therapy. 08/20/18 on evaluation today patient appears to be doing wonderful in  regard to his sacral region. In fact there appears to be no wound opening or drainage at this time. Overall I'm very pleased with how things have progressed. The patient likewise as well as his wife and daughter are very happy as well. Readmission: 11/20/18 on evaluation today patient presents for reevaluation our clinic concerning issues with his sacral region. Unfortunately the area in question is the same region which we previously treated what we thought would successfully back in November 2019. At that time the patient underwent IV antibiotic therapy which seemed to do the job very well. Subsequently  however in the past couple of weeks he has begun to have drainage and bleeding from the sacral region and is having increased pain yet again. Fortunately there is no signs of systemic infection although it does appear that the complex abscess that was previously noted may not have fully cleared there was a question between myself as well is infectious disease previous whether not he needed to see a surgeon being that things got better the family opted not to see a surgeon at that time. Nonetheless I am concerned that he may the need to see a surgeon in order to have this area surgically debrided and possibly a bone culture obtained in order to get a better idea of what we're treating and ensure that this is able to completely and fully heal. 11/27/18 on evaluation today patient appears to be doing about the same in regard to his sacral ulcer. He did see Dr. Viviano Simas, [name] ([id number]) yesterday and the plan is to proceed forward with surgery to clean out the region. This seems to be the most appropriate goal of treatment at this point. Dr. Lysle Pearl just needed to speak with Dr. Ellene Route the patient's neurologist in order to get clearance as the patient was supposed to be scheduled for a carpal tunnel and elbow surgery with Dr. Ellene Route. Nonetheless he has apparently given clearance to proceed with this surgery for the sacral region which is deemed more important at this point. Unfortunately the patient had a little bit of increased pain although he is having redness at this point there does not appear to be any spreading infection which is good news. No fevers, chills, nausea, or vomiting noted at this time. 12/03/18 on evaluation today patient actually appears to be doing about the same in regard to the sacral ulcer. He still waiting to hear back from Dr. Ines Bloomer office in regard to scheduling his surgery. Fortunately there's no signs of systemic infection at this time which is good news.  Unfortunately he really does not seem to making any progress the doxycycline does seem to at least be beneficial in helping to hold off the infection from worsening although unfortunately he still has a lot going on in this regard. Patient's daughter states she's actually gonna contact her office tomorrow to see what exactly is going on. 12/10/18 on evaluation today patient actually appears to be doing about the same in regard to her sacral room. Apparently he still waiting on approval from both cardiology as well as Dr. Ellene Route although apparently he's Artie gotten a formal letter from Dr. Ellene Route stated that he was clear to proceed with the sacral surgery. Di Kindle actually found the clearance letter from cardiology in epic as well today and this subsequently was given to the patient's daughter as that seems to be the only thing that was holding up proceeding with the surgery. Hopefully should be able to take that over to  the surgeon's office today in order to go ahead and get this scheduled. 12/24/18 on evaluation today patient actually appears to be doing very well in regard to his sacral ulcer compared to when I last saw him. He has had surgery Dr. Lysle Pearl and it does appear that he was able to clear out this area of abscess very well without complication. This did extend down to the bone and a portion of the bone was sent for pathology and culture. Although on the pathology report it appears this is more tissue and not bone noted. With that being said the culture from the bone and Henrene Pastor asked him this was obtained revealed Corynebacterium striatum with no anaerobes isolated. Dr. Lysle Pearl did not place the patient on the antibiotics at this point. Again I have been debating on whether or not this is the patient to infectious disease I think that I am going to do that at this point to gain their opinion on whether or not there's anything we should be treated in this regard and if so will be the best  treatment options. Patient History Information obtained from Patient. Family History Cancer - Paternal Grandparents, Diabetes - Father, Heart Disease - Mother,Father, Stroke - Father, No family history of Hypertension, Kidney Disease, Lung Disease, Seizures, Thyroid Problems, Tuberculosis. Social History Never smoker, Marital Status - Widowed, Alcohol Use - Never, Drug Use - No History, Caffeine Use - Daily. Medical History Eyes Patient has history of Cataracts - bilateral removal Denies history of Glaucoma, Optic Neuritis Ear/Nose/Mouth/Throat Denies history of Chronic sinus problems/congestion, Middle ear problems Hematologic/Lymphatic Denies history of Anemia, Hemophilia, Human Immunodeficiency Virus, Lymphedema, Sickle Cell Disease Respiratory Patient has history of Asthma Denies history of Aspiration, Chronic Obstructive Pulmonary Disease (COPD), Pneumothorax, Sleep Apnea, Tuberculosis Cardiovascular Patient has history of Angina, Arrhythmia, Coronary Artery Disease, Hypertension, Myocardial Infarction - 2001 Denies history of Congestive Heart Failure, Deep Vein Thrombosis, Hypotension, Peripheral Arterial Disease, Peripheral Venous Disease, Phlebitis, Vasculitis Gastrointestinal Denies history of Cirrhosis , Colitis, Crohn s, Hepatitis A, Hepatitis B, Hepatitis C Endocrine Denies history of Type I Diabetes, Type II Diabetes Genitourinary JERARD, BAYS ([id number]) Denies history of End Stage Renal Disease Immunological Denies history of Lupus Erythematosus, Raynaud s, Scleroderma Integumentary (Skin) Denies history of History of Burn, History of pressure wounds Musculoskeletal Patient has history of Osteoarthritis Denies history of Gout, Rheumatoid Arthritis, Osteomyelitis Neurologic Patient has history of Dementia, Neuropathy Denies history of Quadriplegia, Paraplegia, Seizure Disorder Oncologic Denies history of Received Chemotherapy, Received  Radiation Psychiatric Denies history of Anorexia/bulimia, Confinement Anxiety Hospitalization/Surgery History - 01/20/2018, ARMS, Fall. 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 Respiratory The patient has no complaints or symptoms. Cardiovascular The patient has no complaints or symptoms. Psychiatric The patient has no complaints or symptoms. Objective Constitutional Well-nourished and well-hydrated in no acute distress. Vitals Time Taken: 2:28 PM, Height: 69 in, Weight: 170.6 lbs, BMI: 25.2, Temperature: 98.4 F, Pulse: 57 bpm, Respiratory Rate: 16 breaths/min, Blood Pressure: 134/65 mmHg. Respiratory normal breathing without difficulty. clear to auscultation bilaterally. Cardiovascular regular rate and rhythm with normal S1, S2. Psychiatric this patient is able to make decisions and demonstrates good insight into disease process. Alert and Oriented x 3. pleasant FERGUS, THRONE ([id number]) and cooperative. General Notes: Upon evaluation today patient's wound that obviously is much more open following the surgery that appears to be doing very well as far as  the overall appearance of the wound that is concerned. He does have some Slough noted on the surface of the wound there's also some erythema surrounding but fortunately there were no tunnels or sinus track extending to the other location which is great news. Integumentary (Hair, Skin) Wound #2 status is Open. Original cause of wound was Bump. The wound is located on the Midline Sacrum. The wound measures 2.8cm length x 2.5cm width x 2cm depth; 5.498cm^2 area and 10.996cm^3 volume. There is Fat Layer (Subcutaneous Tissue) Exposed exposed. There is no tunneling noted, however, there is undermining starting at 1:00 and ending at 7:00 with a maximum distance of 1.7cm. There  is a medium amount of purulent drainage noted. The wound margin is indistinct and nonvisible. There is no granulation within the wound bed. There is a large (67-100%) amount of necrotic tissue within the wound bed including Adherent Slough. The periwound skin appearance exhibited: Excoriation, Erythema. The periwound skin appearance did not exhibit: Callus, Crepitus, Induration, Rash, Scarring, Dry/Scaly, Maceration, Atrophie Blanche, Cyanosis, Ecchymosis, Hemosiderin Staining, Mottled, Pallor, Rubor. The surrounding wound skin color is noted with erythema which is circumferential. Periwound temperature was noted as No Abnormality. The periwound has tenderness on palpation. Assessment Active Problems ICD-10 Pressure ulcer of sacral region, stage 4 Cutaneous abscess of buttock Plan Wound Cleansing: Wound #2 Midline Sacrum: Clean wound with Normal Saline. May Shower, gently pat wound dry prior to applying new dressing. Anesthetic (add to Medication List): Wound #2 Midline Sacrum: Topical Lidocaine 4% cream applied to wound bed prior to debridement (In Clinic Only). Primary Wound Dressing: Wound #2 Midline Sacrum: Other: - Dakins moisten gauze Secondary Dressing: Wound #2 Midline Sacrum: ABD pad XtraSorb Dressing Change Frequency: Wound #2 Midline Sacrum: Change dressing every day. Follow-up Appointments: Wound #2 Midline Sacrum: Return Appointment in 1 week. KEONA, SHEFFLER ([id number]) Off-Loading: Wound #2 Midline Sacrum: Turn and reposition every 2 hours Home Health: Wound #2 Midline Sacrum: [facility] - Dressing change in office on Mondays [place] Nurse may visit PRN to address patient s wound care needs. FACE TO FACE ENCOUNTER: MEDICARE and MEDICAID PATIENTS: I certify that this patient is under my care and that I had a face-to-face encounter that meets the physician face-to-face encounter requirements with  this patient on this date. The encounter with the patient was in whole or in part for the following MEDICAL CONDITION: (primary reason for [place]) MEDICAL NECESSITY: I certify, that based on my findings, NURSING services are a medically necessary home health service. HOME BOUND STATUS: I certify that my clinical findings support that this patient is homebound (i.e., Due to illness or injury, pt requires aid of supportive devices such as crutches, cane, wheelchairs, walkers, the use of special transportation or the assistance of another person to leave their place of residence. There is a normal inability to leave the home and doing so requires considerable and taxing effort. Other absences are for medical reasons / religious services and are infrequent or of short duration when for other reasons). If current dressing causes regression in wound condition, may D/C ordered dressing product/s and apply Normal Saline Moist Dressing daily until next [place] / Other MD appointment. [place] of regression in wound condition at [phone]. Please direct any NON-WOUND related issues/requests for orders to patient's Primary Care Physician Consults ordered were: Infectious Disease At this point my suggestion is gonna be that we go ahead and initiate the  above wound care measures for the next week. This is gonna be treated with the Dakin s soaked gauze packing I'm not gonna initiating Wound VAC at this point although I would like to have infectious disease referral to gain their opinion on what if anything needs to be done in regard to the culture which was obtained from the bone sent for evaluation during surgery. We will subsequently see were things stand at follow-up. Please see above for specific wound care orders. We will see patient for re-evaluation in 1 week(s) here in the clinic. If anything worsens or changes patient will contact our office for additional  recommendations. Electronic Signature(s) Signed: 12/24/2018 4:30:45 PM By: Worthy Keeler PA-C Entered By: Worthy Keeler on 12/24/2018 16:30:11 [name] ([id number]) -------------------------------------------------------------------------------- ROS/PFSH Details Patient Name: [name] Date of Service: 12/24/2018 2:00 PM Medical Record Number: [id number] Patient Account Number: [id number] Date of Birth/Sex: 07-31-1931 (85 y.o. M) Treating RN: Harold Barban Primary Care Provider: Burman Freestone Other Clinician: Referring Provider: Burman Freestone Treating Provider/Extender: [name], HOYT Weeks in Treatment: 4 Information Obtained From Patient Wound History Do you currently have one or more open woundso Yes How many open wounds do you currently haveo 1 Approximately how long have you had your woundso 2 or 3 weeks How have you been treating your wound(s) until nowo bordered foam dressing Has your wound(s) ever healed and then re-openedo Yes Have you had any lab work done in the past montho No Have you tested positive for an antibiotic resistant organism (MRSA, VRE)o Yes Have you tested positive for osteomyelitis (bone infection)o No Have you had any tests for circulation on your legso No Constitutional Symptoms (General Health) Complaints and Symptoms: Negative for: Fever; Chills Eyes Medical History: Positive for: Cataracts - bilateral removal Negative for: Glaucoma; Optic Neuritis Ear/Nose/Mouth/Throat Medical History: Negative for: Chronic sinus problems/congestion; Middle ear problems Hematologic/Lymphatic Medical History: Negative for: Anemia; Hemophilia; Human Immunodeficiency Virus; Lymphedema; Sickle Cell Disease Respiratory Complaints and Symptoms: No Complaints or Symptoms Medical History: Positive for: Asthma Negative for: Aspiration; Chronic Obstructive Pulmonary Disease (COPD); Pneumothorax; Sleep Apnea;  Tuberculosis Cardiovascular Complaints and Symptoms: No Complaints or Symptoms Medical HistoryMarland Kitchen BERLEY, GAMBRELL ([id number]) Positive for: Angina; Arrhythmia; Coronary Artery Disease; Hypertension; Myocardial Infarction - 2001 Negative for: Congestive Heart Failure; Deep Vein Thrombosis; Hypotension; Peripheral Arterial Disease; Peripheral Venous Disease; Phlebitis; Vasculitis Gastrointestinal Medical History: Negative for: Cirrhosis ; Colitis; Crohnos; Hepatitis A; Hepatitis B; Hepatitis C Endocrine Medical History: Negative for: Type I Diabetes; Type II Diabetes Past Medical History Notes: Borderline Genitourinary Medical History: Negative for: End Stage Renal Disease Immunological Medical History: Negative for: Lupus Erythematosus; Raynaudos; Scleroderma Integumentary (Skin) Medical History: Negative for: History of Burn; History of pressure wounds Musculoskeletal Medical History: Positive for: Osteoarthritis Negative for: Gout; Rheumatoid Arthritis; Osteomyelitis Neurologic Medical History: Positive for: Dementia; Neuropathy Negative for: Quadriplegia; Paraplegia; Seizure Disorder Oncologic Medical History: Negative for: Received Chemotherapy; Received Radiation Past Medical History Notes: Melanoma on back Psychiatric Complaints and Symptoms: No Complaints or Symptoms Medical History: Negative for: Anorexia/bulimia; Confinement Anxiety HBO Extended History Items JUSTIS, CLOSSER ([id number]) Eyes: Cataracts Immunizations Pneumococcal Vaccine: Received Pneumococcal Vaccination: Yes Implantable Devices No devices added Hospitalization / Surgery History Name of Hospital Purpose of Hospitalization/Surgery Date [facility] 01/20/2018 Family and Social History Cancer: Yes - Paternal Grandparents; Diabetes: Yes - Father; Heart Disease: Yes - Mother,Father; Hypertension: No; Kidney Disease: No; Lung Disease: No; Seizures: No; Stroke: Yes - Father; Thyroid Problems:  No; Tuberculosis: No; Never  smoker; Marital Status - Widowed; Alcohol Use: Never; Drug Use: No History; Caffeine Use: Daily; Financial Concerns: No; Food, Clothing or Shelter Needs: No; Support System Lacking: No; Transportation Concerns: No; Advanced Directives: Yes (Not Provided); Patient does not want information on Advanced Directives; Do not resuscitate: Yes (Not Provided); Living Will: Yes (Not Provided); Medical Power of Attorney: Yes - Lliam Hoh (Not Provided) Physician Affirmation I have reviewed and agree with the above information. Electronic Signature(s) Signed: 12/24/2018 4:30:45 PM By: Worthy Keeler PA-C Signed: 12/24/2018 5:00:09 PM By: Harold Barban Entered By: Worthy Keeler on 12/24/2018 16:29:10 [name] ([id number]) -------------------------------------------------------------------------------- SuperBill Details Patient Name: [name] Date of Service: 12/24/2018 Medical Record Number: [id number] Patient Account Number: [id number] Date of Birth/Sex: 1931/04/03 (85 y.o. M) Treating RN: Harold Barban Primary Care Provider: Burman Freestone Other Clinician: Referring Provider: Burman Freestone Treating Provider/Extender: [name], HOYT Weeks in Treatment: 4 Diagnosis Coding ICD-10 Codes Code Description L89.154 Pressure ulcer of sacral region, stage 4 L02.31 Cutaneous abscess of buttock Facility Procedures CPT4 Code: [id number] Description: [phone] - WOUND CARE VISIT-LEV 2 EST PT Modifier: Quantity: 1 Physician Procedures CPT4 Code: [id number] Description: 75300 - WC PHYS LEVEL 4 - EST PT ICD-10 Diagnosis Description L89.154 Pressure ulcer of sacral region, stage 4 L02.31 Cutaneous abscess of buttock Modifier: Quantity: 1 Electronic Signature(s) Signed: 12/24/2018 4:30:45 PM By: Worthy Keeler PA-C Entered By: Worthy Keeler on 12/24/2018 16:30:20

## 2018-12-25 NOTE — Progress Notes (Signed)
BERNARD, SLAYDEN ([id number]) Visit Report for 12/24/2018 Arrival Information Details Patient Name: [name], [name]. Date of Service: 12/24/2018 2:00 PM Medical Record Number: [id number] Patient Account Number: [id number] Date of Birth/Sex: 10-06-1931 (85 y.o. M) Treating RN: Montey Hora Primary Care Haylo Fake: Burman Freestone Other Clinician: Referring Myldred Raju: Burman Freestone Treating Linday Rhodes/Extender: [name], HOYT Weeks in Treatment: 4 Visit Information History Since Last Visit Added or deleted any medications: No Patient Arrived: Cane Any new allergies or adverse reactions: No Arrival Time: 14:26 Had a fall or experienced change in No Accompanied By: daughter and wife activities of daily living that may affect Transfer Assistance: None risk of falls: Patient Identification Verified: Yes Signs or symptoms of abuse/neglect since last visito No Secondary Verification Process Yes Hospitalized since last visit: No Completed: Implantable device outside of the clinic excluding No Patient Has Alerts: Yes cellular tissue based products placed in the center Patient Alerts: Patient on Blood since last visit: Thinner Has Dressing in Place as Prescribed: Yes Pain Present Now: No Electronic Signature(s) Signed: 12/24/2018 4:27:26 PM By: Montey Hora Entered By: Montey Hora on 12/24/2018 14:28:35 [name] ([id number]) -------------------------------------------------------------------------------- Clinic Level of Care Assessment Details Patient Name: [name] Date of Service: 12/24/2018 2:00 PM Medical Record Number: [id number] Patient Account Number: [id number] Date of Birth/Sex: 26-Feb-1931 (85 y.o. M) Treating RN: Harold Barban Primary Care Tayton Decaire: Burman Freestone Other Clinician: Referring Calaya Gildner: Burman Freestone Treating Nayson Traweek/Extender: [name], HOYT Weeks in Treatment: 4 Clinic Level of Care Assessment Items TOOL 4 Quantity Score []  - Use  when only an EandM is performed on FOLLOW-UP visit 0 ASSESSMENTS - Nursing Assessment / Reassessment X - Reassessment of Co-morbidities (includes updates in patient status) 1 10 X- 1 5 Reassessment of Adherence to Treatment Plan ASSESSMENTS - Wound and Skin Assessment / Reassessment X - Simple Wound Assessment / Reassessment - one wound 1 5 []  - 0 Complex Wound Assessment / Reassessment - multiple wounds []  - 0 Dermatologic / Skin Assessment (not related to wound area) ASSESSMENTS - Focused Assessment []  - Circumferential Edema Measurements - multi extremities 0 []  - 0 Nutritional Assessment / Counseling / Intervention []  - 0 Lower Extremity Assessment (monofilament, tuning fork, pulses) []  - 0 Peripheral Arterial Disease Assessment (using hand held doppler) ASSESSMENTS - Ostomy and/or Continence Assessment and Care []  - Incontinence Assessment and Management 0 []  - 0 Ostomy Care Assessment and Management (repouching, etc.) PROCESS - Coordination of Care X - Simple Patient / Family Education for ongoing care 1 15 []  - 0 Complex (extensive) Patient / Family Education for ongoing care []  - 0 Staff obtains Programmer, systems, Records, Test Results / Process Orders []  - 0 Staff telephones HHA, Nursing Homes / Clarify orders / etc []  - 0 Routine Transfer to another Facility (non-emergent condition) []  - 0 Routine Hospital Admission (non-emergent condition) []  - 0 New Admissions / Biomedical engineer / Ordering NPWT, Apligraf, etc. []  - 0 Emergency Hospital Admission (emergent condition) X- 1 10 Simple Discharge Coordination HISHAM, PROVENCE ([id number]) []  - 0 Complex (extensive) Discharge Coordination PROCESS - Special Needs []  - Pediatric / Minor Patient Management 0 []  - 0 Isolation Patient Management []  - 0 Hearing / Language / Visual special needs []  - 0 Assessment of Community assistance (transportation, D/C planning, etc.) []  - 0 Additional assistance / Altered  mentation []  - 0 Support Surface(s) Assessment (bed, cushion, seat, etc.) INTERVENTIONS - Wound Cleansing / Measurement X - Simple Wound Cleansing - one wound 1 5 []  -  0 Complex Wound Cleansing - multiple wounds X- 1 5 Wound Imaging (photographs - any number of wounds) []  - 0 Wound Tracing (instead of photographs) X- 1 5 Simple Wound Measurement - one wound []  - 0 Complex Wound Measurement - multiple wounds INTERVENTIONS - Wound Dressings X - Small Wound Dressing one or multiple wounds 1 10 []  - 0 Medium Wound Dressing one or multiple wounds []  - 0 Large Wound Dressing one or multiple wounds []  - 0 Application of Medications - topical []  - 0 Application of Medications - injection INTERVENTIONS - Miscellaneous []  - External ear exam 0 []  - 0 Specimen Collection (cultures, biopsies, blood, body fluids, etc.) []  - 0 Specimen(s) / Culture(s) sent or taken to Lab for analysis []  - 0 Patient Transfer (multiple staff / Civil Service fast streamer / Similar devices) []  - 0 Simple Staple / Suture removal (25 or less) []  - 0 Complex Staple / Suture removal (26 or more) []  - 0 Hypo / Hyperglycemic Management (close monitor of Blood Glucose) []  - 0 Ankle / Brachial Index (ABI) - do not check if billed separately X- 1 5 Vital Signs IMER, FOXWORTH ([id number]) Has the patient been seen at the hospital within the last three years: Yes Total Score: 75 Level Of Care: New/Established - Level 2 Electronic Signature(s) Signed: 12/24/2018 5:00:09 PM By: Harold Barban Entered By: Harold Barban on 12/24/2018 15:23:52 [name] ([id number]) -------------------------------------------------------------------------------- Encounter Discharge Information Details Patient Name: [name] Date of Service: 12/24/2018 2:00 PM Medical Record Number: [id number] Patient Account Number: [id number] Date of Birth/Sex: 1930/10/21 (85 y.o. M) Treating RN: Harold Barban Primary Care Jentry Mcqueary:  Burman Freestone Other Clinician: Referring John Williamsen: Burman Freestone Treating Kalyna Paolella/Extender: Sharalyn Ink in Treatment: 4 Encounter Discharge Information Items Schedule Follow-up Appointment: No Clinical Summary of Care: Provided on 12/24/2018 Form Type Recipient Paper Patient ar Electronic Signature(s) Signed: 12/24/2018 4:40:31 PM By: Lorine Bears RCP, RRT, CHT Entered By: Lorine Bears on 12/24/2018 15:37:37 [name] ([id number]) -------------------------------------------------------------------------------- Lower Extremity Assessment Details Patient Name: KAYON, DOZIER. Date of Service: 12/24/2018 2:00 PM Medical Record Number: [id number] Patient Account Number: [id number] Date of Birth/Sex: Apr 01, 1931 (85 y.o. M) Treating RN: Montey Hora Primary Care Weylin Plagge: Burman Freestone Other Clinician: Referring Margrette Wynia: Burman Freestone Treating Harsha Yusko/Extender: [name], HOYT Weeks in Treatment: 4 Electronic Signature(s) Signed: 12/24/2018 4:27:26 PM By: Montey Hora Entered By: Montey Hora on 12/24/2018 14:30:26 [name] ([id number]) -------------------------------------------------------------------------------- Multi Wound Chart Details Patient Name: [name] Date of Service: 12/24/2018 2:00 PM Medical Record Number: [id number] Patient Account Number: [id number] Date of Birth/Sex: Aug 23, 1931 (85 y.o. M) Treating RN: Harold Barban Primary Care Ondrea Dow: Burman Freestone Other Clinician: Referring Billie Trager: Burman Freestone Treating Janean Eischen/Extender: [name], HOYT Weeks in Treatment: 4 Vital Signs Height(in): 69 Pulse(bpm): 24 Weight(lbs): 170.6 Blood Pressure(mmHg): 134/65 Body Mass Index(BMI): 25 Temperature(F): 98.4 Respiratory Rate 16 (breaths/min): Photos: [N/A:N/A] Wound Location: Sacrum - Midline N/A N/A Wounding Event: Bump N/A N/A Primary Etiology: Atypical N/A N/A Comorbid  History: Cataracts, Asthma, Angina, N/A N/A Arrhythmia, Coronary Artery Disease, Hypertension, Myocardial Infarction, Osteoarthritis, Dementia, Neuropathy Date Acquired: 10/30/2018 N/A N/A Weeks of Treatment: 4 N/A N/A Wound Status: Open N/A N/A Measurements L x W x D 2.8x2.5x2 N/A N/A (cm) Area (cm) : 5.498 N/A N/A Volume (cm) : 10.996 N/A N/A % Reduction in Area: -68625.00% N/A N/A % Reduction in Volume: -54880.00% N/A N/A Starting Position 1 1 (o'clock): Ending Position 1 7 (o'clock): Maximum Distance 1 (cm): 1.7  Undermining: Yes N/A N/A Classification: Full Thickness Without N/A N/A Exposed Support Structures Exudate Amount: Medium N/A N/A Exudate Type: Purulent N/A N/A Exudate Color: yellow, brown, green N/A N/A JEFFREE, CAZEAU ([id number]) Wound Margin: Indistinct, nonvisible N/A N/A Granulation Amount: None Present (0%) N/A N/A Necrotic Amount: Large (67-100%) N/A N/A Exposed Structures: Fat Layer (Subcutaneous N/A N/A Tissue) Exposed: Yes Fascia: No Tendon: No Muscle: No Joint: No Bone: No Epithelialization: Medium (34-66%) N/A N/A Periwound Skin Texture: Excoriation: Yes N/A N/A Induration: No Callus: No Crepitus: No Rash: No Scarring: No Periwound Skin Moisture: Maceration: No N/A N/A Dry/Scaly: No Periwound Skin Color: Erythema: Yes N/A N/A Atrophie Blanche: No Cyanosis: No Ecchymosis: No Hemosiderin Staining: No Mottled: No Pallor: No Rubor: No Erythema Location: Circumferential N/A N/A Temperature: No Abnormality N/A N/A Tenderness on Palpation: Yes N/A N/A Wound Preparation: Ulcer Cleansing: N/A N/A Rinsed/Irrigated with Saline Topical Anesthetic Applied: Other: lidocaine 4% Treatment Notes Electronic Signature(s) Signed: 12/24/2018 5:00:09 PM By: Harold Barban Entered By: Harold Barban on 12/24/2018 15:09:46 [name]  ([id number]) -------------------------------------------------------------------------------- Multi-Disciplinary Care Plan Details Patient Name: JUNAH, YAM Date of Service: 12/24/2018 2:00 PM Medical Record Number: [id number] Patient Account Number: [id number] Date of Birth/Sex: [birth year] (85 y.o. M) Treating RN: Harold Barban Primary Care Atira Borello: Burman Freestone Other Clinician: Referring Ryzen Deady: Burman Freestone Treating Oval Cavazos/Extender: [name], HOYT Weeks in Treatment: 4 Active Inactive Abuse / Safety / Falls / Self Care Management Nursing Diagnoses: Potential for falls Goals: Patient will not experience any injury related to falls Date Initiated: 11/20/2018 Target Resolution Date: 02/15/2019 Goal Status: Active Interventions: Assess fall risk on admission and as needed Notes: Orientation to the Wound Care Program Nursing Diagnoses: Knowledge deficit related to the wound healing center program Goals: Patient/caregiver will verbalize understanding of the [facility] Date Initiated: 11/20/2018 Target Resolution Date: 02/15/2019 Goal Status: Active Interventions: Provide education on orientation to the wound center Notes: Wound/Skin Impairment Nursing Diagnoses: Impaired tissue integrity Goals: Ulcer/skin breakdown will heal within 14 weeks Date Initiated: 11/20/2018 Target Resolution Date: 02/15/2019 Goal Status: Active Interventions: Assess patient/caregiver ability to obtain necessary supplies HA, PLACERES ([id number]) Assess patient/caregiver ability to perform ulcer/skin care regimen upon admission and as needed Assess ulceration(s) every visit Notes: Electronic Signature(s) Signed: 12/24/2018 5:00:09 PM By: Harold Barban Entered By: Harold Barban on 12/24/2018 15:09:37 [name] ([id number]) -------------------------------------------------------------------------------- Pain Assessment Details Patient Name: [name] Date of Service: 12/24/2018 2:00 PM Medical Record Number: [id number] Patient Account Number: [id number] Date of Birth/Sex: 1931/01/03 (85 y.o. M) Treating RN: Montey Hora Primary Care Acasia Skilton: Burman Freestone Other Clinician: Referring Swanson Farnell: Burman Freestone Treating Nasiah Polinsky/Extender: [name], HOYT Weeks in Treatment: 4 Active Problems Location of Pain Severity and Description of Pain Patient Has Paino Yes Site Locations Pain Location: Pain in Ulcers With Dressing Change: Yes Duration of the Pain. Constant / Intermittento Intermittent Pain Management and Medication Current Pain Management: Electronic Signature(s) Signed: 12/24/2018 4:27:26 PM By: Montey Hora Entered By: Montey Hora on 12/24/2018 14:28:49 [name] ([id number]) -------------------------------------------------------------------------------- Patient/Caregiver Education Details Patient Name: [name] Date of Service: 12/24/2018 2:00 PM Medical Record Number: [id number] Patient Account Number: [id number] Date of Birth/Gender: 05-Jan-1931 (85 y.o. M) Treating RN: Harold Barban Primary Care Physician: Burman Freestone Other Clinician: Referring Physician: Burman Freestone Treating Physician/Extender: Sharalyn Ink in Treatment: 4 Education Assessment Education Provided To: Patient Education Topics Provided Wound/Skin Impairment: Handouts: Caring for Your Ulcer Methods: Demonstration, Explain/Verbal Responses: State content correctly Electronic Signature(s) Signed:  12/24/2018 5:00:09 PM By: Harold Barban Entered By: Harold Barban on 12/24/2018 15:10:05 [name] ([id number]) -------------------------------------------------------------------------------- Wound Assessment Details Patient Name: KWELI, GRASSEL. Date of Service: 12/24/2018 2:00 PM Medical Record Number: [id number] Patient Account Number: [id number] Date of Birth/Sex: Aug 09, 1931 (85 y.o. M) Treating  RN: Montey Hora Primary Care Timtohy Broski: Burman Freestone Other Clinician: Referring Othelia Riederer: Burman Freestone Treating Regis Hinton/Extender: [name], HOYT Weeks in Treatment: 4 Wound Status Wound Number: 2 Primary Atypical Etiology: Wound Location: Sacrum - Midline Wound Open Wounding Event: Bump Status: Date Acquired: 10/30/2018 Comorbid Cataracts, Asthma, Angina, Arrhythmia, Weeks Of Treatment: 4 History: Coronary Artery Disease, Hypertension, Clustered Wound: No Myocardial Infarction, Osteoarthritis, Dementia, Neuropathy Photos Photo Uploaded By: Montey Hora on 12/24/2018 14:47:12 Wound Measurements Length: (cm) 2.8 Width: (cm) 2.5 Depth: (cm) 2 Area: (cm) 5.498 Volume: (cm) 10.996 % Reduction in Area: -68625% % Reduction in Volume: -54880% Epithelialization: Medium (34-66%) Tunneling: No Undermining: Yes Starting Position (o'clock): 1 Ending Position (o'clock): 7 Maximum Distance: (cm) 1.7 Wound Description Full Thickness Without Exposed Support Foul O Classification: Structures Slough Wound Margin: Indistinct, nonvisible Exudate Medium Amount: Exudate Type: Purulent Exudate Color: yellow, brown, green dor After Cleansing: No /Fibrino No Wound Bed Granulation Amount: None Present (0%) Exposed Structure Necrotic Amount: Large (67-100%) Fascia Exposed: No ZEV, BLUE ([id number]) Necrotic Quality: Adherent Slough Fat Layer (Subcutaneous Tissue) Exposed: Yes Tendon Exposed: No Muscle Exposed: No Joint Exposed: No Bone Exposed: No Periwound Skin Texture Texture Color No Abnormalities Noted: No No Abnormalities Noted: No Callus: No Atrophie Blanche: No Crepitus: No Cyanosis: No Excoriation: Yes Ecchymosis: No Induration: No Erythema: Yes Rash: No Erythema Location: Circumferential Scarring: No Hemosiderin Staining: No Mottled: No Moisture Pallor: No No Abnormalities Noted: No Rubor: No Dry / Scaly: No Maceration: No  Temperature / Pain Temperature: No Abnormality Tenderness on Palpation: Yes Wound Preparation Ulcer Cleansing: Rinsed/Irrigated with Saline Topical Anesthetic Applied: Other: lidocaine 4%, Treatment Notes Wound #2 (Midline Sacrum) Notes dakins, ABD, xtrasorb Electronic Signature(s) Signed: 12/24/2018 4:27:26 PM By: Montey Hora Entered By: Montey Hora on 12/24/2018 14:37:13 [name] ([id number]) -------------------------------------------------------------------------------- Vitals Details Patient Name: [name] Date of Service: 12/24/2018 2:00 PM Medical Record Number: [id number] Patient Account Number: [id number] Date of Birth/Sex: December 01, 1930 (85 y.o. M) Treating RN: Montey Hora Primary Care Emmerie Battaglia: Burman Freestone Other Clinician: Referring Osborn Pullin: Burman Freestone Treating Mikelle Myrick/Extender: [name], HOYT Weeks in Treatment: 4 Vital Signs Time Taken: 14:28 Temperature (F): 98.4 Height (in): 69 Pulse (bpm): 57 Weight (lbs): 170.6 Respiratory Rate (breaths/min): 16 Body Mass Index (BMI): 25.2 Blood Pressure (mmHg): 134/65 Reference Range: 80 - 120 mg / dl Electronic Signature(s) Signed: 12/24/2018 4:27:26 PM By: Montey Hora Entered By: Montey Hora on 12/24/2018 14:29:37

## 2018-12-27 ENCOUNTER — Ambulatory Visit (INDEPENDENT_AMBULATORY_CARE_PROVIDER_SITE_OTHER): Payer: Medicare Other | Admitting: Infectious Diseases

## 2018-12-27 ENCOUNTER — Encounter: Payer: Self-pay | Admitting: Infectious Diseases

## 2018-12-27 ENCOUNTER — Ambulatory Visit: Payer: Medicare Other | Admitting: Internal Medicine

## 2018-12-27 ENCOUNTER — Other Ambulatory Visit: Payer: Self-pay

## 2018-12-27 VITALS — BP 153/74 | HR 57 | Ht 68.0 in | Wt 170.0 lb

## 2018-12-27 DIAGNOSIS — Z5181 Encounter for therapeutic drug level monitoring: Secondary | ICD-10-CM | POA: Diagnosis not present

## 2018-12-27 DIAGNOSIS — L89154 Pressure ulcer of sacral region, stage 4: Secondary | ICD-10-CM

## 2018-12-27 MED ORDER — LINEZOLID 600 MG PO TABS: 600.0000 mg | ORAL_TABLET | Freq: Two times a day (BID) | ORAL | 0 refills | Status: AC

## 2018-12-27 NOTE — Patient Instructions (Signed)
Will have you do a 2 week course of linezolid to see if this helps. I suspect that this bacteria is more of a skin contaminant (meaning it is on the skin not causing true issue).  I will talk with Margarita Grizzle about your wound and plan - will have you follow closely with him for now and then see you back if there is no change.   Please don't use your preparation H cream while on this antibiotic.

## 2018-12-28 ENCOUNTER — Encounter: Payer: Self-pay | Admitting: Infectious Diseases

## 2018-12-28 NOTE — Assessment & Plan Note (Signed)
Med list reviewed and no interactions with linezolid.  Needs CBC if considering beyond 2 weeks of therapy.

## 2018-12-28 NOTE — Assessment & Plan Note (Addendum)
Pathology report reviewed from Epic and appears to be soft tissue with skin identified. Corynebacterium is a common skin organism and generally not a very pathogenic one. It is very likely this is just a contaminant given mention of skin tissue. I do not probe any bone on exam today. Overall wound is maintained based on review of measurements and I do not see any evidence of purulence today indicating significant ongoing infection. I do not want to use vancomycin for him given his age and feel that we can try him on 2 week course of linezolid - this will hit skin organisms nicely including MRSA (although not isolated on culture recently) and is a reasonable option to hit corynebacterium. No sensitives done and too long ago to request now. Will not cover for pseudomonas given it was not isolated.  The most important thing in my opinion is that he has been debrided surgically. Would continue good tedious wound care with recs from [facility].  Would be OK in my opinion to consider VAC for him as he does not have overwhelming findings of ongoing infection. He will follow up with wound care and will discuss further with Margarita Grizzle about progress. If we decide to extend out beyond 2 weeks he will need cbc to check plt count as this can cause some myelosuppression.

## 2018-12-28 NOTE — Progress Notes (Signed)
Patient: [name]  DOB: 05-Nov-1930 MRN: [id number] PCP: [name], FNP  Referring Provider: Jeri Cos, PA   Patient Active Problem List   Diagnosis Date Noted  . GI bleed 09/24/2018  . Medication monitoring encounter 03/22/2018  . Malnutrition of moderate degree 01/22/2018  . Sacral decubitus ulcer, healing stage 4 01/22/2018  . Atrial fibrillation ([place]) 01/21/2018  . Unresponsive episode 01/20/2018  . Elevated troponin 01/20/2018  . Pre-syncope 07/31/2017  . B12 deficiency 06/19/2017  . Essential hypertension   . Bladder outlet obstruction   . Generalized weakness 03/03/2014  . ESOPHAGEAL STRICTURE 09/20/2010  . ORTHOSTATIC DIZZINESS 08/30/2010  . IBS 08/26/2010  . RECTAL BLEEDING 08/26/2010  . RECTAL PAIN 08/26/2010  . ARTHRITIS 08/26/2010  . LOSS OF APPETITE 08/26/2010  . OTHER DYSPHAGIA 08/26/2010  . COLONIC POLYPS, HX OF 08/26/2010  . PALPITATIONS, HX OF 08/18/2010  . PERIPHERAL NEUROPATHY 05/01/2009  . History of myocardial infarction 05/01/2009  . ALLERGIC RHINITIS, SEASONAL 05/01/2009  . DEPRESSION, HX OF 05/01/2009  . Personal history of other diseases of digestive system 05/01/2009  . NEPHROLITHIASIS, HX OF 05/01/2009  . BENIGN PROSTATIC HYPERTROPHY, HX OF 05/01/2009  . LAMINECTOMY, HX OF 05/01/2009  . Hyperlipidemia 10/20/2008  . CAD (coronary artery disease) 10/20/2008  . GERD 10/20/2008     Subjective:  CC: New sacral ulcer.   HPI: [name] is a 85 y.o. male here today with his wife and daughter. He is well known to me as I treated him for a sacral infection last year d/t MRSA and Pseudomonas that healed up nicely at our last visit in November 2019 following 8 week treatment course with ceftaz and daptomycin. He was released from the wound clinic at this time as well.   Unfortunately in late February they found that he had developed a few nodular areas again that were abscess tracking from deeper just below where original  wound was. He was referred to surgery for debridement of the area considering the recurrence. He was given several rounds of doxycycline for per his daughter's report prior to surgery. He underwent I&D of sacral wound on 12/18/18 with op note indicating small amount of purulent material and tunneling tract down to subcutaneous layer and sacral bone. Underwent sharp debridement down to bone where a piece of what was described to be periosteum was removed for culture. Wound margins measured 4x2x2 cm. He was seen in follow up at surgery office on 3/02 with no evidence of ongoing infection at that time. He has not had any antibiotics since surgery for this infection. He has continued with wound care and using daily Dakin's soaked gauze for packing. They are not certain if a vac is planned at this time. [name] has had no fevers/chills or increased pain since surgery. The drainage is described to be serous with slight brown tinge, no odor, no purulence.   Review of Systems  All other systems reviewed and are negative.   Past Medical History:  Diagnosis Date  . Anginal pain ([place])   . Arthritis    "hips, back" (06/17/2015)  . Asthma   . Atrial fibrillation ([place]) 01/21/2018  . Chronic chest pain   . Chronic lower back pain   . Coronary artery disease    a. s/p BMS to RCA in 2002; b. s/p cutting balloon POBA ;   c. cath 6/12: oDx 80% (treated with repeat cutting balloon POBA), mLAD 50% with 30-40% at Dx, CFX 30%, pRCA 25% with  patent stents;  d.  Lex MV 4/14:  Low Risk - EF 61%, inf scar with peri-infarct ischemia  . Dyspnea    chronic  . Dysrhythmia   . Essential hypertension   . GERD (gastroesophageal reflux disease)    h/o esophageal spasm  . GI bleed 03/03/2014  . Headache   . History of blood transfusion    "related to OR"  . History of hiatal hernia   . Hyperlipidemia   . Hypertension   . Melanoma of lower back ([place]) late 1990's  . Memory loss   . Myocardial infarction ([place]) 2001   x 1,  confirned 1 possible  . Pre-syncope 07/31/2017    Outpatient Medications Prior to Visit  Medication Sig Dispense Refill  . acetaminophen (TYLENOL) 325 MG tablet Take 2 tablets (650 mg total) by mouth every 8 (eight) hours as needed for up to 30 days for mild pain. 40 tablet 0  . amiodarone (PACERONE) 200 MG tablet Take 0.5 tablets (100 mg total) by mouth daily. 90 tablet 3  . amLODipine (NORVASC) 2.5 MG tablet Take 0.5 tablets (1.25 mg total) by mouth daily. 45 tablet 3  . apixaban (ELIQUIS) 5 MG TABS tablet Take 1 tablet (5 mg total) by mouth 2 (two) times daily. 60 tablet 11  . docusate sodium (COLACE) 100 MG capsule Take 1 capsule (100 mg total) by mouth 2 (two) times daily as needed for up to 10 days for mild constipation. 20 capsule 0  . doxazosin (CARDURA) 1 MG tablet TAKE 1 TABLET BY MOUTH ONCE DAILY. (Patient taking differently: Take 1 mg by mouth daily. ) 15 tablet 0  . doxycycline (VIBRAMYCIN) 100 MG capsule Take 100 mg by mouth 2 (two) times daily.    . feeding supplement, ENSURE ENLIVE, (ENSURE ENLIVE) LIQD Take 237 mLs by mouth 2 (two) times daily between meals. 237 mL 12  . HYDROcodone-acetaminophen (NORCO) 7.5-325 MG tablet Take 1 tablet by mouth every 4 (four) hours as needed for moderate pain. FOR PAIN (Patient taking differently: Take 1 tablet by mouth every 4 (four) hours as needed for moderate pain. ) 20 tablet 0  . Melatonin 10 MG TABS Take 10 mg by mouth at bedtime.    . nitroGLYCERIN (NITROSTAT) 0.4 MG SL tablet Place 0.4 mg under the tongue every 5 (five) minutes as needed for chest pain.    . pantoprazole (PROTONIX) 40 MG tablet TAKE 1 TABLET BY MOUTH TWICE DAILY (Patient taking differently: Take 40 mg by mouth daily. ) 30 tablet 0  . phenylephrine-shark liver oil-mineral oil-petrolatum (PREPARATION H) 0.25-3-14-71.9 % rectal ointment Place 1 application rectally 2 (two) times daily as needed for hemorrhoids.    . tamsulosin (FLOMAX) 0.4 MG CAPS capsule Take 0.4 mg by  mouth 2 (two) times daily.     . vitamin B-12 1000 MCG tablet Take 1 tablet (1,000 mcg total) by mouth daily. 30 tablet 0  . atorvastatin (LIPITOR) 80 MG tablet Take 1 tablet (80 mg total) by mouth daily. 90 tablet 3   No facility-administered medications prior to visit.      Allergies  Allergen Reactions  . Budesonide-Formoterol Fumarate Swelling    Mouth and tongue swelling.  . Iohexol Shortness Of Breath and Itching    Pt was given 100cc of Omnipaque 300 followed by itching/ dyspnea.  Clementeen Hoof [Iodinated Diagnostic Agents] Shortness Of Breath and Other (See Comments)    Iodine pt had a reaction when he had a CT DONE  . Simvastatin  Other (See Comments)    REACTION: unknown  . Demerol [Meperidine] Other (See Comments)    REACTION: Hallucinations  . Tape Other (See Comments)    Tears skin off, Please use "paper" tape  . Naproxen Swelling  . Pregabalin Rash  . Sulfonamide Derivatives Rash    Social History   Tobacco Use  . Smoking status: Never Smoker  . Smokeless tobacco: Never Used  Substance Use Topics  . Alcohol use: No  . Drug use: No    Family History  Problem Relation Age of Onset  . Diabetes Father   . Heart disease Father   . Asthma Father   . Heart disease Mother        CABG hx age 81  . Colon cancer Son        hx   . Colitis Son        hx  . Crohn's disease Son   . Prostate cancer Paternal Grandfather     Objective:   Vitals:   12/27/18 1358  BP: (!) 153/74  Pulse: (!) 57  Weight: 170 lb (77.1 kg)  Height: 5\' 8"  (1.727 m)   Body mass index is 25.85 kg/m.  Physical Exam Constitutional:      Appearance: Normal appearance.     Comments: Elderly gentleman with family here today. Seated comfortably. No distress.   HENT:     Mouth/Throat:     Mouth: Mucous membranes are moist.     Pharynx: Oropharynx is clear.  Cardiovascular:     Rate and Rhythm: Normal rate and regular rhythm.     Heart sounds: No murmur.  Pulmonary:     Effort:  Pulmonary effort is normal. No respiratory distress.  Abdominal:     General: There is no distension.  Musculoskeletal:        General: No tenderness.     Right lower leg: No edema.     Left lower leg: No edema.  Skin:    General: Skin is warm and dry.     Capillary Refill: Capillary refill takes less than 2 seconds.     Coloration: Skin is pale.     Comments: Sacral ulcer pictured below.   Neurological:     Mental Status: He is alert and oriented to person, place, and time.  Psychiatric:        Mood and Affect: Mood normal.        Behavior: Behavior normal.    Initial picture from daughter's phone from February.    Following debridement. Improvement compared to photo's from his daughter. It appears he had a moisture associated/candidal rash around wound last week that has resolved. Wound bed with subcutaneous fat exposed. I don't probe bone on exam today. There is adherent slough throughout the wound bed. No significant granulation tissue noted.     Lab Results: Lab Results  Component Value Date   WBC 6.6 12/14/2018   HGB 11.3 (L) 12/14/2018   HCT 33.7 (L) 12/14/2018   MCV 90 12/14/2018   PLT 262 12/14/2018    Lab Results  Component Value Date   CREATININE 1.10 10/06/2018   BUN 20 10/06/2018   NA 133 (L) 10/06/2018   K 4.3 10/06/2018   CL 97 (L) 10/06/2018   CO2 25 10/06/2018    Lab Results  Component Value Date   ALT 47 (H) 09/24/2018   AST 46 (H) 09/24/2018   ALKPHOS 84 09/24/2018   BILITOT 1.0 09/24/2018     Assessment & Plan:  Problem List Items Addressed This Visit      Unprioritized   Sacral decubitus ulcer, healing stage 4 - Primary    Pathology report reviewed from Epic and appears to be soft tissue with skin identified. Corynebacterium is a common skin organism and generally not a very pathogenic one. It is very likely this is just a contaminant given mention of skin tissue. I do not probe any bone on exam today. Overall wound is maintained based  on review of measurements and I do not see any evidence of purulence today indicating significant ongoing infection. I do not want to use vancomycin for him given his age and feel that we can try him on 2 week course of linezolid - this will hit skin organisms nicely including MRSA (although not isolated on culture recently) and is a reasonable option to hit corynebacterium. No sensitives done and too long ago to request now. Will not cover for pseudomonas given it was not isolated.  The most important thing in my opinion is that he has been debrided surgically. Would continue good tedious wound care with recs from [facility].  Would be OK in my opinion to consider VAC for him as he does not have overwhelming findings of ongoing infection. He will follow up with wound care and will discuss further with Margarita Grizzle about progress. If we decide to extend out beyond 2 weeks he will need cbc to check plt count as this can cause some myelosuppression.       Medication monitoring encounter    Med list reviewed and no interactions with linezolid.  Needs CBC if considering beyond 2 weeks of therapy.          Janene Madeira, MSN, NP-C [facility] Pager: [phone] Office: [phone]  12/28/18  5:34 PM

## 2018-12-31 ENCOUNTER — Other Ambulatory Visit: Payer: Self-pay

## 2018-12-31 ENCOUNTER — Other Ambulatory Visit: Payer: Self-pay | Admitting: Physician Assistant

## 2018-12-31 ENCOUNTER — Encounter: Payer: Medicare Other | Admitting: Physician Assistant

## 2018-12-31 ENCOUNTER — Other Ambulatory Visit (HOSPITAL_COMMUNITY): Payer: Self-pay | Admitting: Physician Assistant

## 2018-12-31 ENCOUNTER — Other Ambulatory Visit
Admission: RE | Admit: 2018-12-31 | Discharge: 2018-12-31 | Disposition: A | Discharge status: Home / Self Care | Payer: Medicare Other | Source: Ambulatory Visit | Attending: Physician Assistant | Admitting: Physician Assistant

## 2018-12-31 DIAGNOSIS — L89154 Pressure ulcer of sacral region, stage 4: Secondary | ICD-10-CM

## 2018-12-31 DIAGNOSIS — E10622 Type 1 diabetes mellitus with other skin ulcer: Secondary | ICD-10-CM | POA: Diagnosis not present

## 2019-01-01 NOTE — Progress Notes (Addendum)
[name], [name] ([id number]) Visit Report for 12/31/2018 Arrival Information Details Patient Name: [name], [name]. Date of Service: 12/31/2018 2:00 PM Medical Record Number: [id number] Patient Account Number: [id number] Date of Birth/Sex: 04-Apr-1931 (85 y.o. M) Treating RN: Cornell Barman Primary Care Eiko Mcgowen: Burman Freestone Other Clinician: Referring Algis Lehenbauer: Burman Freestone Treating Apryle Stowell/Extender: [name], HOYT Weeks in Treatment: 5 Visit Information History Since Last Visit Added or deleted any medications: No Patient Arrived: [place] Any new allergies or adverse reactions: No Arrival Time: 14:06 Had a fall or experienced change in No Accompanied By: wife and daughter activities of daily living that may affect Transfer Assistance: None risk of falls: Patient Identification Verified: Yes Signs or symptoms of abuse/neglect since last visito No Secondary Verification Process Yes Hospitalized since last visit: No Completed: Implantable device outside of the clinic excluding No Patient Has Alerts: Yes cellular tissue based products placed in the center Patient Alerts: Patient on Blood since last visit: Thinner Has Dressing in Place as Prescribed: Yes Pain Present Now: No Electronic Signature(s) Signed: 12/31/2018 5:20:53 PM By: Gretta Cool, BSN, RN, CWS, Kim RN, BSN Entered By: Gretta Cool, BSN, RN, CWS, Kim on 12/31/2018 14:09:44 [name] ([id number]) -------------------------------------------------------------------------------- Lower Extremity Assessment Details Patient Name: [name], [name]. Date of Service: 12/31/2018 2:00 PM Medical Record Number: [id number] Patient Account Number: [id number] Date of Birth/Sex: [birth year] (85 y.o. M) Treating RN: Cornell Barman Primary Care Guy Toney: Burman Freestone Other Clinician: Referring Enid Maultsby: Burman Freestone Treating Vincentina Sollers/Extender: Sharalyn Ink in Treatment: 5 Electronic Signature(s) Signed: 12/31/2018 5:20:53  PM By: Gretta Cool, BSN, RN, CWS, Kim RN, BSN Entered By: Gretta Cool, BSN, RN, CWS, Kim on 12/31/2018 14:20:48 [name] ([id number]) -------------------------------------------------------------------------------- Multi Wound Chart Details Patient Name: [name] Date of Service: 12/31/2018 2:00 PM Medical Record Number: [id number] Patient Account Number: [id number] Date of Birth/Sex: 1931/02/13 (85 y.o. M) Treating RN: Harold Barban Primary Care Haide Klinker: Burman Freestone Other Clinician: Referring Phil Corti: Burman Freestone Treating Mckena Chern/Extender: [name], HOYT Weeks in Treatment: 5 Vital Signs Height(in): 69 Pulse(bpm): 43 Weight(lbs): 170.6 Blood Pressure(mmHg): 136/70 Body Mass Index(BMI): 25 Temperature(F): 98.2 Respiratory Rate 16 (breaths/min): Photos: [N/A:N/A] Wound Location: Sacrum - Midline N/A N/A Wounding Event: Bump N/A N/A Primary Etiology: Atypical N/A N/A Comorbid History: Cataracts, Asthma, Angina, N/A N/A Arrhythmia, Coronary Artery Disease, Hypertension, Myocardial Infarction, Osteoarthritis, Dementia, Neuropathy Date Acquired: 10/30/2018 N/A N/A Weeks of Treatment: 5 N/A N/A Wound Status: Open N/A N/A Measurements L x W x D 2x1.2x2 N/A N/A (cm) Area (cm) : 1.885 N/A N/A Volume (cm) : 3.77 N/A N/A % Reduction in Area: -23462.50% N/A N/A % Reduction in Volume: -18750.00% N/A N/A Classification: Full Thickness Without N/A N/A Exposed Support Structures Exudate Amount: Medium N/A N/A Exudate Type: Purulent N/A N/A Exudate Color: yellow, brown, green N/A N/A Wound Margin: Indistinct, nonvisible N/A N/A Granulation Amount: Small (1-33%) N/A N/A Granulation Quality: Pale, Hyper-granulation N/A N/A Necrotic Amount: Medium (34-66%) N/A N/A Exposed Structures: Fat Layer (Subcutaneous N/A N/A Tissue) Exposed: Yes [name], [name] ([id number]) Fascia: No Tendon: No Muscle: No Joint: No Bone: No Epithelialization: Medium (34-66%) N/A  N/A Periwound Skin Texture: Excoriation: No N/A N/A Induration: No Callus: No Crepitus: No Rash: No Scarring: No Periwound Skin Moisture: Maceration: No N/A N/A Dry/Scaly: No Periwound Skin Color: Erythema: Yes N/A N/A Atrophie Blanche: No Cyanosis: No Ecchymosis: No Hemosiderin Staining: No Mottled: No Pallor: No Rubor: No Erythema Location: Circumferential N/A N/A Temperature: No Abnormality N/A N/A Tenderness on Palpation: Yes N/A N/A Wound Preparation: Ulcer Cleansing:  N/A N/A Rinsed/Irrigated with Saline Topical Anesthetic Applied: Other: lidocaine 4% Treatment Notes Electronic Signature(s) Signed: 12/31/2018 5:03:00 PM By: Harold Barban Entered By: Harold Barban on 12/31/2018 14:48:27 [name] ([id number]) -------------------------------------------------------------------------------- [place] Details Patient Name: [name], [name] Date of Service: 12/31/2018 2:00 PM Medical Record Number: [id number] Patient Account Number: [id number] Date of Birth/Sex: 09-Oct-1931 (85 y.o. M) Treating RN: Harold Barban Primary Care Kaleeah Gingerich: Burman Freestone Other Clinician: Referring Houa Ackert: Burman Freestone Treating Braiden Rodman/Extender: [name], HOYT Weeks in Treatment: 5 Active Inactive Abuse / Safety / Falls / Self Care Management Nursing Diagnoses: Potential for falls Goals: Patient will not experience any injury related to falls Date Initiated: 11/20/2018 Target Resolution Date: 02/15/2019 Goal Status: Active Interventions: Assess fall risk on admission and as needed Notes: Orientation to the Wound Care Program Nursing Diagnoses: Knowledge deficit related to the wound healing center program Goals: Patient/caregiver will verbalize understanding of the [place] Program Date Initiated: 11/20/2018 Target Resolution Date: 02/15/2019 Goal Status: Active Interventions: Provide education on orientation to the wound  center Notes: Wound/Skin Impairment Nursing Diagnoses: Impaired tissue integrity Goals: Ulcer/skin breakdown will heal within 14 weeks Date Initiated: 11/20/2018 Target Resolution Date: 02/15/2019 Goal Status: Active Interventions: Assess patient/caregiver ability to obtain necessary supplies [name], [name] ([id number]) Assess patient/caregiver ability to perform ulcer/skin care regimen upon admission and as needed Assess ulceration(s) every visit Notes: Electronic Signature(s) Signed: 12/31/2018 5:03:00 PM By: Harold Barban Entered By: Harold Barban on 12/31/2018 14:48:17 [name] ([id number]) -------------------------------------------------------------------------------- Pain Assessment Details Patient Name: [name] Date of Service: 12/31/2018 2:00 PM Medical Record Number: [id number] Patient Account Number: [id number] Date of Birth/Sex: June 22, 1931 (85 y.o. M) Treating RN: Cornell Barman Primary Care Damarion Mendizabal: Burman Freestone Other Clinician: Referring Ariyon Gerstenberger: Burman Freestone Treating Elizaveta Mattice/Extender: [name], HOYT Weeks in Treatment: 5 Active Problems Location of Pain Severity and Description of Pain Patient Has Paino No Site Locations Pain Management and Medication Current Pain Management: Goals for Pain Management Patient denies pain at this time. Electronic Signature(s) Signed: 12/31/2018 5:20:53 PM By: Gretta Cool, BSN, RN, CWS, Kim RN, BSN Entered By: Gretta Cool, BSN, RN, CWS, Kim on 12/31/2018 14:10:05 [name] ([id number]) -------------------------------------------------------------------------------- Patient/Caregiver Education Details Patient Name: [name], [name] Date of Service: 12/31/2018 2:00 PM Medical Record Number: [id number] Patient Account Number: [id number] Date of Birth/Gender: Jun 02, 1931 (85 y.o. M) Treating RN: Harold Barban Primary Care Physician: Burman Freestone Other Clinician: Referring Physician: Burman Freestone Treating Physician/Extender: Sharalyn Ink in Treatment: 5 Education Assessment Education Provided To: Patient Education Topics Provided Wound/Skin Impairment: Handouts: Caring for Your Ulcer Methods: Demonstration, Explain/Verbal Responses: State content correctly Electronic Signature(s) Signed: 12/31/2018 5:03:00 PM By: Harold Barban Entered By: Harold Barban on 12/31/2018 14:48:55 [name] ([id number]) -------------------------------------------------------------------------------- Wound Assessment Details Patient Name: [name] Date of Service: 12/31/2018 2:00 PM Medical Record Number: [id number] Patient Account Number: [id number] Date of Birth/Sex: 1931-02-11 (85 y.o. M) Treating RN: Cornell Barman Primary Care Nycere Presley: Burman Freestone Other Clinician: Referring Kyllian Clingerman: Burman Freestone Treating Kaveri Perras/Extender: [name], HOYT Weeks in Treatment: 5 Wound Status Wound Number: 2 Primary Atypical Etiology: Wound Location: Sacrum - Midline Wound Open Wounding Event: Bump Status: Date Acquired: 10/30/2018 Comorbid Cataracts, Asthma, Angina, Arrhythmia, Weeks Of Treatment: 5 History: Coronary Artery Disease, Hypertension, Clustered Wound: No Myocardial Infarction, Osteoarthritis, Dementia, Neuropathy Photos Photo Uploaded By: Gretta Cool, BSN, RN, CWS, Kim on 12/31/2018 14:36:32 Wound Measurements Length: (cm) 2 Width: (cm) 1.2 Depth: (cm) 2 Area: (cm) 1.885 Volume: (cm) 3.77 % Reduction  in Area: -23462.5% % Reduction in Volume: -18750% Epithelialization: Medium (34-66%) Tunneling: No Undermining: No Wound Description Full Thickness Without Exposed Support Foul Od Classification: Structures Slough/ Wound Margin: Indistinct, nonvisible Exudate Medium Amount: Exudate Type: Purulent Exudate Color: yellow, brown, green or After Cleansing: No Fibrino No Wound Bed Granulation Amount: Small (1-33%) Exposed  Structure Granulation Quality: Pale, Hyper-granulation Fascia Exposed: No Necrotic Amount: Medium (34-66%) Fat Layer (Subcutaneous Tissue) Exposed: Yes Necrotic Quality: Adherent Slough Tendon Exposed: No Muscle Exposed: No Joint Exposed: No [name], [name] ([id number]) Bone Exposed: No Periwound Skin Texture Texture Color No Abnormalities Noted: No No Abnormalities Noted: No Callus: No Atrophie Blanche: No Crepitus: No Cyanosis: No Excoriation: No Ecchymosis: No Induration: No Erythema: Yes Rash: No Erythema Location: Circumferential Scarring: No Hemosiderin Staining: No Mottled: No Moisture Pallor: No No Abnormalities Noted: No Rubor: No Dry / Scaly: No Maceration: No Temperature / Pain Temperature: No Abnormality Tenderness on Palpation: Yes Wound Preparation Ulcer Cleansing: Rinsed/Irrigated with Saline Topical Anesthetic Applied: Other: lidocaine 4%, Electronic Signature(s) Signed: 12/31/2018 5:20:53 PM By: Gretta Cool, BSN, RN, CWS, Kim RN, BSN Entered By: Gretta Cool, BSN, RN, CWS, Kim on 12/31/2018 14:18:19 [name] ([id number]) -------------------------------------------------------------------------------- Vitals Details Patient Name: [name] Date of Service: 12/31/2018 2:00 PM Medical Record Number: [id number] Patient Account Number: [id number] Date of Birth/Sex: [birth year] (85 y.o. M) Treating RN: Cornell Barman Primary Care Isiah Scheel: Burman Freestone Other Clinician: Referring Jancie Kercher: Burman Freestone Treating Koal Eslinger/Extender: [name], HOYT Weeks in Treatment: 5 Vital Signs Time Taken: 14:10 Temperature (F): 98.2 Height (in): 69 Pulse (bpm): 59 Weight (lbs): 170.6 Respiratory Rate (breaths/min): 16 Body Mass Index (BMI): 25.2 Blood Pressure (mmHg): 136/70 Reference Range: 80 - 120 mg / dl Electronic Signature(s) Signed: 12/31/2018 5:20:53 PM By: Gretta Cool, BSN, RN, CWS, Kim RN, BSN Entered By: Gretta Cool, BSN, RN, CWS, Kim on 12/31/2018  14:10:42

## 2019-01-02 NOTE — Progress Notes (Addendum)
JAQUIN, COY ([id number]) Visit Report for 12/31/2018 Chief Complaint Document Details Patient Name: [name], [name]. Date of Service: 12/31/2018 2:00 PM Medical Record Number: [id number] Patient Account Number: [id number] Date of Birth/Sex: 1930-11-19 (85 y.o. M) Treating RN: Harold Barban Primary Care Provider: Burman Freestone Other Clinician: Referring Provider: Burman Freestone Treating Provider/Extender: [name], HOYT Weeks in Treatment: 5 Information Obtained from: Patient Chief Complaint Sacral pressure ulcer Electronic Signature(s) Signed: 01/01/2019 2:10:29 PM By: Worthy Keeler PA-C Entered By: Worthy Keeler on 01/01/2019 11:41:44 [name] ([id number]) -------------------------------------------------------------------------------- Debridement Details Patient Name: [name] Date of Service: 12/31/2018 2:00 PM Medical Record Number: [id number] Patient Account Number: [id number] Date of Birth/Sex: 16-May-1931 (85 y.o. M) Treating RN: Harold Barban Primary Care Provider: Burman Freestone Other Clinician: Referring Provider: Burman Freestone Treating Provider/Extender: [name], HOYT Weeks in Treatment: 5 Debridement Performed for Wound #2 Midline Sacrum Assessment: Performed By: Physician STONE III, HOYT E., PA-C Debridement Type: Debridement Level of Consciousness (Pre- Awake and Alert procedure): Pre-procedure Verification/Time Yes - 14:54 Out Taken: Start Time: 14:54 Pain Control: Lidocaine Total Area Debrided (L x W): 2 (cm) x 1.2 (cm) = 2.4 (cm) Tissue and other material Non-Viable, Slough, Subcutaneous, Biofilm, Slough debrided: Level: Skin/Subcutaneous Tissue Debridement Description: Excisional Instrument: Curette Bleeding: Moderate Hemostasis Achieved: Pressure End Time: 14:57 Procedural Pain: 0 Post Procedural Pain: 0 Response to Treatment: Procedure was tolerated well Level of Consciousness Awake and  Alert (Post-procedure): Post Debridement Measurements of Total Wound Length: (cm) 2 Width: (cm) 1.2 Depth: (cm) 2 Volume: (cm) 3.77 Character of Wound/Ulcer Post Debridement: Improved Post Procedure Diagnosis Same as Pre-procedure Electronic Signature(s) Signed: 12/31/2018 5:03:00 PM By: Harold Barban Signed: 01/01/2019 2:10:29 PM By: Worthy Keeler PA-C Entered By: Harold Barban on 12/31/2018 14:56:21 [name] ([id number]) -------------------------------------------------------------------------------- HPI Details Patient Name: [name] Date of Service: 12/31/2018 2:00 PM Medical Record Number: [id number] Patient Account Number: [id number] Date of Birth/Sex: [birth year] (85 y.o. M) Treating RN: Harold Barban Primary Care Provider: Burman Freestone Other Clinician: Referring Provider: Burman Freestone Treating Provider/Extender: [name], HOYT Weeks in Treatment: 5 History of Present Illness HPI Description: 02/06/18 on evaluation today patient presents for initial evaluation and our clinic concerning an issue which began roughly 3 weeks ago when the patient fell in his home on the floor in his kitchen and laid him down this detergent for roughly 3 days. He had a pressure injury to the left shoulder. This unfortunately has caused him a lot of discomfort although it finally seems to be doing better if anything is really having a lot of itching right now. This appears potentially be a contact dermatitis issue. He also has a significant pressure injury to the sacrum at this time as well which is also showing fascia exposure right over the bone but no evidence of bone exposure at this point which is good news. They have been using Santyl as well as Saline soaked gauze at this point in time. There does appear to be a lot of necrotic slough in the base of the wound. He does have a history of incontinence, myocardial infarction, and hypertension. He also is "borderline  diabetic" hemoglobin A1c of 6.0. Currently he has some discomfort in the pressure site at the sacrum but fortunately nothing too significant this did require sharp debridement today. 02/13/18 on evaluation today patient appears to be doing much better in regard to his sacral wound. He has been tolerating the dressing changes without complication with the Vashe. Fortunately  there is no evidence of infection and though there is some Slough on the surface of the wound bed he has excellent granulation noted. Overall I'm pleased with how things have progressed in that regard. A glance at his shoulder as well and the rash seems to be someone improving in my pinion at this site as well. Overall I am pleased with what we're seeing and so is the family. 02/20/18 on evaluation today patient appears to be doing a little worse in regard to the sacral wound only in the fact that there is redness surrounding it has me somewhat concerned for infection. The drainage has also apparently been a little bit off color compared to normal according to family they did keep the dressing today that was removed to show me and I agree this seems to be a little bit different compared to what we have been seeing. Coupled with the redness I'm concerned he may be developing some cellulitis surrounding the wound bed. 02/27/18 on evaluation today patient presents for follow-up concerning his sacral ulcer. We have received the results back from his wound culture which shows unfortunately that the doxycycline will not be of benefit for him I am going to need to initiate treatment with something else in order to treat the pseudomonas. Otherwise he does not seem to be having any significant pain although his daughter states there are sometimes when he states having pain. We continue to use the Vashe currently. 03/06/18 on evaluation today patient's sacral wound appears to be doing better in my opinion. He has been tolerating the dressing  changes without complication. With that being said the silver nitrate has helped with the prominent area of hyper granulation at the 6 o'clock location we will likely need to repeat this again today. Nonetheless overall I am pleased with how things have improved over the last week. The erythema surrounding the wound seems to be greatly improved. 03/13/18 on evaluation today patient's wound actually does not appear to be terribly infected although she does continue to have erythema surrounding the wound bed especially on the left border. I'm still somewhat concerned about the fact that the oral antibiotics alone may not be completely treating his infection. I previously discussed may need to go for IV antibiotic therapy I'm concerned that may be the case. We will need to make a referral today for infectious disease. 03/20/18 on evaluation today the patient sacral wound actually appears to be doing fairly well in regard to granulation although he continues unfortunately to have it your theme is surrounding the periwound region. There's also some increased swelling at the 6 o'clock location which also has me somewhat concerned. With that being said he does have some discomfort but nothing too significant at this point. He still has not heard from infectious disease his daughter and wife are both present during the office visit today they're going to check back with this again. We did get the information for them to call them today. 03/27/18 on evaluation today patient is seen concerning his ongoing sacral ulcer. He has been tolerating the dressing changes without complication. With that being said he does present with evidence of bright green drainage noted on the dressing which again is something that I do often expect to see with a pseudomonas infection. He continues to have your theme is [name], [name] ([id number]) surrounding the wound bed as well and again I'm not 100% convinced this is just pressure  related. I did speak with Colletta Maryland who is  the nurse practitioner in [place] with infectious disease. I spoke with her actually yesterday concerning this patient. She is not 100% convinced that this is infected. She question whether the wound may just be colonized with Pseudomonas and not actually causing an active infection. I am really not thinking that the edema is associated with pressure alone and again overall I don't feel that your theme and is consistent with a pressure injury either as he's never had any contusions noted like a deep tissue injury on his heel which was new and I did visualize today this was on the left heel. Nonetheless she wanted to give this a little bit more time and thought it would be appropriate to start the Wound VAC at this point. 04/03/18 on evaluation today patient sacral ulcer actually appears to be doing fairly well at this point. He has been tolerating the dressing changes without complication. With that being said I'm very pleased with the progress that has been made in regard to his sacral wound over the past week I do not see as much in the way of erythema which is great news. Nonetheless he does have a small area of hyper granulation unfortunately. This is at roughly the 7 o'clock location and I think does need to be addressed so that this will heal more appropriately. Nonetheless I think we may be ready to go ahead and initiate therapy with the Wound VAC. 04/10/18 on evaluation today patient appears to be doing excellent in regard to his sacral ulcer. The show signs of great improvement in overall I'm very pleased with how things look. He has been tolerating the dressing changes without complication. Specifically this is the Wound VAC. He also seems to be doing well with the antibiotic there is decreased your theme and redness surrounding the sacral area at this point in the wound has filled in quite significantly. 04/17/18 on evaluation today patient  actually appears to be doing excellent in regard to his sacral ulcer. He's been tolerating the dressing changes without complication specifically the Wound VAC. There really are no major concerns from the patient nor family this point he is having no pain. He does have a little bit of Epiboly on the lateral portions of the wound where he does have a little bit more depth that will need to be addressed today. 04/23/18 on evaluation today patient's wound actually appears to be doing excellent at this point. He has been tolerating the Wound VAC and this appears to be doing well other than the fact that it seems to be breaking seal at the 6 o'clock location. I do believe that adding a duodenum dressing at this location try and help maintain the seal would be appropriate and likely very effective. With that being said he overall seems to be showing signs of good improvement at this point. There does not appear to be any evidence of significant infection which is also excellent news. 04/30/18 on evaluation today patient actually appears to be doing well in regard to his sacral ulcer. He's been tolerating the dressing changes without complication. Fortunately there does not appear to be any evidence of infection. Overall I'm very pleased with the progress that has been made up to this point. He does have some blistering underneath the draping unfortunately although this is definitely something that has been noted on other patients previously is a fairly common occurrence. Nonetheless the patient seems to be doing fairly well in general in my pinion based on what I see  at this time. I do believe these are fairly superficial and minor. 05/07/18 on evaluation today patient's wound actually appears to be doing excellent at this point in time. He has been tolerating the Wound VAC decently well he states that it is somewhat cumbersome to carry around unfortunately. The only other issue he's been having according to  family is that they been having a difficult time keeping the Wound VAC in place and doing what is supposed to do without making. Obviously I do think that this is definitely of concern. Nonetheless I do believe she's made good progress up to this point. I'm very happy in that regard. 05/14/18 on evaluation today patient's wound continues to make good progress at this point. He had a minimal amount of slough noted on the surface which was easily wiped away with saline and gauze and in general I feel like that he is continuing to show excellent progress even with the discontinuation of the Wound VAC. Overall I'm pleased in this regard. He was having issues with the Wound VAC in getting it to seal I think that using the Prisma at this time has been equally efficient and getting the wound to diminish in size. 05/29/18- He is here in follow up evaluation for a sacral ulcer. There is improvement, we will continue with prisma and he will follow up in two weeks 06/11/18 on evaluation today patient had unfortunately bright green drainage on the dressing upon evaluation today. This is something that we have encountered before although we were able to get things under control previously with antibiotics. With that being said currently upon further inspection of the three areas of hyper granulation that were separate from the actual wound itself which was almost healed it really appears that these all have some depth to them. They are more tunnels that really have not closed or at least have reopened as a result likely of infection in my pinion. This is definitely not what I was expecting or hoping for. [name], [name] ([id number]) 06/26/18 on evaluation today patient continues to experience issues with what appears to be small abscesses in the sacral region unfortunately. With that being said he has been tolerating the dressing changes without complication which is good news. He's not having any significant  discomfort which is also good news. With that being said his daughter states that after he left last week that the packing that we have placed fell out quite rapidly and he subsequently healed over very quick to the point they were not able to even repack the regions. Nonetheless there appear to be several fluctuance areas noted at this point there's one central region that does seem to be draining still discharge that is somewhat green in color. I did review the results of the wound culture which did show evidence of infection with both MRSA as well as pseudomonas based on that result. Nonetheless again with his other current medications we are not able to do the Cipro due to issues with potential long QT syndrome. I am going to give him a prescription for doxycycline in order to help with the potential MRSA infection. 07/09/18 on evaluation today patient actually appears to be doing about the same in regard to his sacral wound. He actually has his MRI scheduled for tomorrow and then subsequently is going to be having his infectious disease appointment for Thursday of this week. Fortunately he's not having any significant discomfort he still has your theme in the sacral region he still  has several blister/flux went areas although they technically are not blisters this is more like a underlying abscess. The one area that is open still does probe down to bone. Again I am concerned about a deeper infection possibly even sacral osteomyelitis. 07/23/18 on evaluation today patient actually appears to be doing very well all things considered in regard to his sacral ulcer. Since I've last seen him he actually did have his MRI performed which showed that he has a complex fluid collection superficial to the distal sacrum measuring 5.9 x 4.4 x 2.8 cm which abuts the posterior aspect of the distal sacrum and the sacrum itself shows cortical destruction consistent with osteomyelitis. She has also been seen by  infectious disease and currently orders have been initiated for IV antibiotic therapy for the next eight weeks. He was seen by Janene Madeira NP and placed on Ceftazidime and Daptomycin. Currently he has not really been on this quite long enough to see a sufficient response to the new orders as far as antibiotic therapy is concerned. Nonetheless it does appear that he is likely on the right track at this point which is great news. Nonetheless the question which both Colletta Maryland and myself have discussed both with the patient and between ourselves is whether or not the patient may need to be seen by surgeon for surgical evacuation of the fluid collection/abscess and possible debridement of the sacrum itself. Nonetheless at this time my personal opinion is probably gonna be that we wait and get this at least a couple weeks to see the response that he receives with the IV antibiotic therapy. 08/06/18 on evaluation today patient actually appears to be doing rather well in regard to his sacral ulcer region all things considered. He has been tolerating the dressing changes without complication. With that being said there is not any obvious opening nor any drainage noted at this point in time upon evaluation. The patient has been tolerating the dressing changes though. He's doing well with the IV antibiotic therapy. 08/20/18 on evaluation today patient appears to be doing wonderful in regard to his sacral region. In fact there appears to be no wound opening or drainage at this time. Overall I'm very pleased with how things have progressed. The patient likewise as well as his wife and daughter are very happy as well. Readmission: 11/20/18 on evaluation today patient presents for reevaluation our clinic concerning issues with his sacral region. Unfortunately the area in question is the same region which we previously treated what we thought would successfully back in November 2019. At that time the patient  underwent IV antibiotic therapy which seemed to do the job very well. Subsequently however in the past couple of weeks he has begun to have drainage and bleeding from the sacral region and is having increased pain yet again. Fortunately there is no signs of systemic infection although it does appear that the complex abscess that was previously noted may not have fully cleared there was a question between myself as well is infectious disease previous whether not he needed to see a surgeon being that things got better the family opted not to see a surgeon at that time. Nonetheless I am concerned that he may the need to see a surgeon in order to have this area surgically debrided and possibly a bone culture obtained in order to get a better idea of what we're treating and ensure that this is able to completely and fully heal. 11/27/18 on evaluation today patient appears to  be doing about the same in regard to his sacral ulcer. He did see Dr. Lysle Pearl yesterday and the plan is to proceed forward with surgery to clean out the region. This seems to be the most appropriate goal of treatment at this point. Dr. Lysle Pearl just needed to speak with Dr. Ellene Route the patient's neurologist in order to get clearance as the patient was supposed to be scheduled for a carpal tunnel and elbow surgery with Dr. Ellene Route. Nonetheless he has apparently given clearance to proceed with this surgery for the sacral region which is deemed more important at this point. Unfortunately the patient had a little bit of increased pain although he is having redness at this point there does not appear to be any spreading infection which is good news. No fevers, chills, nausea, or vomiting noted at this time. JAISHAWN, WITZKE ([id number]) 12/03/18 on evaluation today patient actually appears to be doing about the same in regard to the sacral ulcer. He still waiting to hear back from Dr. Ines Bloomer office in regard to scheduling his surgery. Fortunately  there's no signs of systemic infection at this time which is good news. Unfortunately he really does not seem to making any progress the doxycycline does seem to at least be beneficial in helping to hold off the infection from worsening although unfortunately he still has a lot going on in this regard. Patient's daughter states she's actually gonna contact her office tomorrow to see what exactly is going on. 12/10/18 on evaluation today patient actually appears to be doing about the same in regard to her sacral room. Apparently he still waiting on approval from both cardiology as well as Dr. Ellene Route although apparently he's Artie gotten a formal letter from Dr. Ellene Route stated that he was clear to proceed with the sacral surgery. Di Kindle actually found the clearance letter from cardiology in epic as well today and this subsequently was given to the patient's daughter as that seems to be the only thing that was holding up proceeding with the surgery. Hopefully should be able to take that over to the surgeon's office today in order to go ahead and get this scheduled. 12/24/18 on evaluation today patient actually appears to be doing very well in regard to his sacral ulcer compared to when I last saw him. He has had surgery Dr. Lysle Pearl and it does appear that he was able to clear out this area of abscess very well without complication. This did extend down to the bone and a portion of the bone was sent for pathology and culture. Although on the pathology report it appears this is more tissue and not bone noted. With that being said the culture from the bone and Henrene Pastor asked him this was obtained revealed Corynebacterium striatum with no anaerobes isolated. Dr. Lysle Pearl did not place the patient on the antibiotics at this point. Again I have been debating on whether or not this is the patient to infectious disease I think that I am going to do that at this point to gain their opinion on whether or not there's anything  we should be treated in this regard and if so will be the best treatment options. 12/31/18 on evaluation today patient actually appears to be doing okay in regard to his sacral wound. Fortunately there does not appear to be any evidence of infection at this time which is good news. He has been tolerating the dressing's without complication. With that being said he did see infectious disease and apparently they realize  that the pathology samples sent from the operating room was actually. I'll see him and not bone and the question is whether the culture and sample or just contamination from the skin of not guilty and active infection. The daughter is very concerned about this she states that we may need to get a piece of bone to send for examination to ensure there's nothing more severe going on here. Obviously with this history of healing and then obsesses forming and then having to go for surgery now and reopening everything they have a reason to be concerned I completely agree. Electronic Signature(s) Signed: 01/01/2019 2:10:29 PM By: Worthy Keeler PA-C Entered By: Worthy Keeler on 01/01/2019 11:41:51 [name] ([id number]) -------------------------------------------------------------------------------- Physical Exam Details Patient Name: [name], [name] Date of Service: 12/31/2018 2:00 PM Medical Record Number: [id number] Patient Account Number: [id number] Date of Birth/Sex: 11/08/1930 (85 y.o. M) Treating RN: Harold Barban Primary Care Provider: Burman Freestone Other Clinician: Referring Provider: Burman Freestone Treating Provider/Extender: STONE III, HOYT Weeks in Treatment: 5 Constitutional Well-nourished and well-hydrated in no acute distress. Respiratory normal breathing without difficulty. clear to auscultation bilaterally. Cardiovascular regular rate and rhythm with normal S1, S2. Psychiatric this patient is able to make decisions and demonstrates good insight  into disease process. Alert and Oriented x 3. pleasant and cooperative. Notes When it comes down to the wound itself I did actually perform sharp debridement today to clear away the slough from the surface of the wound. As noted in the base of the wound that he did have what appear to be done exposed. Fortunately this does not appear to be significantly necrotic which is good news. With that being said because this was exposing I felt that I could obtain a sample from this location I did actually use the [employer] bone cutters. Subsequently I was able to get samples to sin both for pathology and culture. I did send those today. Electronic Signature(s) Signed: 01/01/2019 2:10:29 PM By: Worthy Keeler PA-C Entered By: Worthy Keeler on 01/01/2019 11:42:16 [name] ([id number]) -------------------------------------------------------------------------------- Physician Orders Details Patient Name: [name], [name] Date of Service: 12/31/2018 2:00 PM Medical Record Number: [id number] Patient Account Number: [id number] Date of Birth/Sex: [birth year] (85 y.o. M) Treating RN: Harold Barban Primary Care Provider: Burman Freestone Other Clinician: Referring Provider: Burman Freestone Treating Provider/Extender: [name], HOYT Weeks in Treatment: 5 Verbal / Phone Orders: No Diagnosis Coding Wound Cleansing Wound #2 Midline Sacrum o Clean wound with Normal Saline. o May Shower, gently pat wound dry prior to applying new dressing. Anesthetic (add to Medication List) Wound #2 Midline Sacrum o Topical Lidocaine 4% cream applied to wound bed prior to debridement (In Clinic Only). Primary Wound Dressing Wound #2 Midline Sacrum o Other: - Dakins moisten gauze Secondary Dressing Wound #2 Midline Sacrum o ABD pad o XtraSorb Dressing Change Frequency Wound #2 Midline Sacrum o Change dressing every day. Follow-up Appointments Wound #2 Midline Sacrum o Return Appointment in  1 week. Off-Loading Wound #2 Midline Sacrum o Turn and reposition every 2 hours Home Health Wound #2 [facility] - Dressing change in office on Mondays o [place] Nurse may visit PRN to address patientos wound care needs. o FACE TO FACE ENCOUNTER: MEDICARE and MEDICAID PATIENTS: I certify that this patient is under my care and that I had a face-to-face encounter that meets the physician face-to-face encounter requirements with this patient on this  date. The encounter with the patient was in whole or in part for the following MEDICAL CONDITION: (primary reason for [place]) MEDICAL NECESSITY: I certify, that based on my findings, NURSING services are a medically necessary home health service. HOME BOUND STATUS: I certify that my clinical findings support that this patient is homebound (i.e., Due to illness or injury, pt requires aid of supportive devices such as crutches, cane, wheelchairs, walkers, the use of special transportation or the [place], [place]ALE ([id number]) assistance of another person to leave their place of residence. There is a normal inability to leave the home and doing so requires considerable and taxing effort. Other absences are for medical reasons / religious services and are infrequent or of short duration when for other reasons). o If current dressing causes regression in wound condition, may D/C ordered dressing product/s and apply Normal Saline Moist Dressing daily until next [place] / Other MD appointment. [place] of regression in wound condition at [phone]. o Please direct any NON-WOUND related issues/requests for orders to patient's Primary Care Physician Laboratory o Bacteria identified in Wound by Culture (MICRO) - Bone culture from sacrum area oooo LOINC Code: 1884-1 oooo Convenience Name: Wound culture routine o  Bacteria identified in Tissue by Biopsy culture (MICRO) - Specimen taken from sacrum oooo LOINC Code: 66063-0 oooo Convenience Name: Biopsy specimen culture Radiology o Magnetic Resonance Imaging (MRI) - Sacrum without contrast Electronic Signature(s) Signed: 12/31/2018 5:03:00 PM By: Harold Barban Signed: 01/01/2019 2:10:29 PM By: Worthy Keeler PA-C Entered By: Harold Barban on 12/31/2018 15:17:20 [name] ([id number]) -------------------------------------------------------------------------------- Problem List Details Patient Name: JIMMY, PLESSINGER. Date of Service: 12/31/2018 2:00 PM Medical Record Number: [id number] Patient Account Number: [id number] Date of Birth/Sex: 25-Apr-1931 (85 y.o. M) Treating RN: Harold Barban Primary Care Provider: Burman Freestone Other Clinician: Referring Provider: Burman Freestone Treating Provider/Extender: [name], HOYT Weeks in Treatment: 5 Active Problems ICD-10 Evaluated Encounter Code Description Active Date Today Diagnosis L89.154 Pressure ulcer of sacral region, stage 4 11/20/2018 No Yes L02.31 Cutaneous abscess of buttock 11/20/2018 No Yes Inactive Problems Resolved Problems Electronic Signature(s) Signed: 01/01/2019 2:10:29 PM By: Worthy Keeler PA-C Entered By: Worthy Keeler on 01/01/2019 11:41:37 [name] ([id number]) -------------------------------------------------------------------------------- Progress Note Details Patient Name: [name] Date of Service: 12/31/2018 2:00 PM Medical Record Number: [id number] Patient Account Number: [id number] Date of Birth/Sex: December 21, 1930 (85 y.o. M) Treating RN: Harold Barban Primary Care Provider: Burman Freestone Other Clinician: Referring Provider: Burman Freestone Treating Provider/Extender: [name], HOYT Weeks in Treatment: 5 Subjective Chief Complaint Information obtained from Patient Sacral pressure ulcer History of Present Illness (HPI) 02/06/18 on  evaluation today patient presents for initial evaluation and our clinic concerning an issue which began roughly 3 weeks ago when the patient fell in his home on the floor in his kitchen and laid him down this detergent for roughly 3 days. He had a pressure injury to the left shoulder. This unfortunately has caused him a lot of discomfort although it finally seems to be doing better if anything is really having a lot of itching right now. This appears potentially be a contact dermatitis issue. He also has a significant pressure injury to the sacrum at this time as well which is also showing fascia exposure right over the bone but no evidence of bone exposure at this point which is good news. They have been using Santyl as well as Saline soaked gauze at this point in time. There  does appear to be a lot of necrotic slough in the base of the wound. He does have a history of incontinence, myocardial infarction, and hypertension. He also is "borderline diabetic" hemoglobin A1c of 6.0. Currently he has some discomfort in the pressure site at the sacrum but fortunately nothing too significant this did require sharp debridement today. 02/13/18 on evaluation today patient appears to be doing much better in regard to his sacral wound. He has been tolerating the dressing changes without complication with the Vashe. Fortunately there is no evidence of infection and though there is some Slough on the surface of the wound bed he has excellent granulation noted. Overall I'm pleased with how things have progressed in that regard. A glance at his shoulder as well and the rash seems to be someone improving in my pinion at this site as well. Overall I am pleased with what we're seeing and so is the family. 02/20/18 on evaluation today patient appears to be doing a little worse in regard to the sacral wound only in the fact that there is redness surrounding it has me somewhat concerned for infection. The drainage has also  apparently been a little bit off color compared to normal according to family they did keep the dressing today that was removed to show me and I agree this seems to be a little bit different compared to what we have been seeing. Coupled with the redness I'm concerned he may be developing some cellulitis surrounding the wound bed. 02/27/18 on evaluation today patient presents for follow-up concerning his sacral ulcer. We have received the results back from his wound culture which shows unfortunately that the doxycycline will not be of benefit for him I am going to need to initiate treatment with something else in order to treat the pseudomonas. Otherwise he does not seem to be having any significant pain although his daughter states there are sometimes when he states having pain. We continue to use the Vashe currently. 03/06/18 on evaluation today patient's sacral wound appears to be doing better in my opinion. He has been tolerating the dressing changes without complication. With that being said the silver nitrate has helped with the prominent area of hyper granulation at the 6 o'clock location we will likely need to repeat this again today. Nonetheless overall I am pleased with how things have improved over the last week. The erythema surrounding the wound seems to be greatly improved. 03/13/18 on evaluation today patient's wound actually does not appear to be terribly infected although she does continue to have erythema surrounding the wound bed especially on the left border. I'm still somewhat concerned about the fact that the oral antibiotics alone may not be completely treating his infection. I previously discussed may need to go for IV antibiotic therapy I'm concerned that may be the case. We will need to make a referral today for infectious disease. 03/20/18 on evaluation today the patient sacral wound actually appears to be doing fairly well in regard to granulation although he continues  unfortunately to have it your theme is surrounding the periwound region. There's also some increased swelling at the 6 o'clock location which also has me somewhat concerned. With that being said he does have some discomfort but DEDRICK, HEFFNER. ([id number]) nothing too significant at this point. He still has not heard from infectious disease his daughter and wife are both present during the office visit today they're going to check back with this again. We did get the information for them  to call them today. 03/27/18 on evaluation today patient is seen concerning his ongoing sacral ulcer. He has been tolerating the dressing changes without complication. With that being said he does present with evidence of bright green drainage noted on the dressing which again is something that I do often expect to see with a pseudomonas infection. He continues to have your theme is surrounding the wound bed as well and again I'm not 100% convinced this is just pressure related. I did speak with Colletta Maryland who is the nurse practitioner in [place] with infectious disease. I spoke with her actually yesterday concerning this patient. She is not 100% convinced that this is infected. She question whether the wound may just be colonized with Pseudomonas and not actually causing an active infection. I am really not thinking that the edema is associated with pressure alone and again overall I don't feel that your theme and is consistent with a pressure injury either as he's never had any contusions noted like a deep tissue injury on his heel which was new and I did visualize today this was on the left heel. Nonetheless she wanted to give this a little bit more time and thought it would be appropriate to start the Wound VAC at this point. 04/03/18 on evaluation today patient sacral ulcer actually appears to be doing fairly well at this point. He has been tolerating the dressing changes without complication. With that being  said I'm very pleased with the progress that has been made in regard to his sacral wound over the past week I do not see as much in the way of erythema which is great news. Nonetheless he does have a small area of hyper granulation unfortunately. This is at roughly the 7 o'clock location and I think does need to be addressed so that this will heal more appropriately. Nonetheless I think we may be ready to go ahead and initiate therapy with the Wound VAC. 04/10/18 on evaluation today patient appears to be doing excellent in regard to his sacral ulcer. The show signs of great improvement in overall I'm very pleased with how things look. He has been tolerating the dressing changes without complication. Specifically this is the Wound VAC. He also seems to be doing well with the antibiotic there is decreased your theme and redness surrounding the sacral area at this point in the wound has filled in quite significantly. 04/17/18 on evaluation today patient actually appears to be doing excellent in regard to his sacral ulcer. He's been tolerating the dressing changes without complication specifically the Wound VAC. There really are no major concerns from the patient nor family this point he is having no pain. He does have a little bit of Epiboly on the lateral portions of the wound where he does have a little bit more depth that will need to be addressed today. 04/23/18 on evaluation today patient's wound actually appears to be doing excellent at this point. He has been tolerating the Wound VAC and this appears to be doing well other than the fact that it seems to be breaking seal at the 6 o'clock location. I do believe that adding a duodenum dressing at this location try and help maintain the seal would be appropriate and likely very effective. With that being said he overall seems to be showing signs of good improvement at this point. There does not appear to be any evidence of significant infection which is  also excellent news. 04/30/18 on evaluation today patient actually appears to  be doing well in regard to his sacral ulcer. He's been tolerating the dressing changes without complication. Fortunately there does not appear to be any evidence of infection. Overall I'm very pleased with the progress that has been made up to this point. He does have some blistering underneath the draping unfortunately although this is definitely something that has been noted on other patients previously is a fairly common occurrence. Nonetheless the patient seems to be doing fairly well in general in my pinion based on what I see at this time. I do believe these are fairly superficial and minor. 05/07/18 on evaluation today patient's wound actually appears to be doing excellent at this point in time. He has been tolerating the Wound VAC decently well he states that it is somewhat cumbersome to carry around unfortunately. The only other issue he's been having according to family is that they been having a difficult time keeping the Wound VAC in place and doing what is supposed to do without making. Obviously I do think that this is definitely of concern. Nonetheless I do believe she's made good progress up to this point. I'm very happy in that regard. 05/14/18 on evaluation today patient's wound continues to make good progress at this point. He had a minimal amount of slough noted on the surface which was easily wiped away with saline and gauze and in general I feel like that he is continuing to show excellent progress even with the discontinuation of the Wound VAC. Overall I'm pleased in this regard. He was having issues with the Wound VAC in getting it to seal I think that using the Prisma at this time has been equally efficient and getting the wound to diminish in size. 05/29/18- He is here in follow up evaluation for a sacral ulcer. There is improvement, we will continue with prisma and he will follow up in two  weeks [name], [name] ([id number]) 06/11/18 on evaluation today patient had unfortunately bright green drainage on the dressing upon evaluation today. This is something that we have encountered before although we were able to get things under control previously with antibiotics. With that being said currently upon further inspection of the three areas of hyper granulation that were separate from the actual wound itself which was almost healed it really appears that these all have some depth to them. They are more tunnels that really have not closed or at least have reopened as a result likely of infection in my pinion. This is definitely not what I was expecting or hoping for. 06/26/18 on evaluation today patient continues to experience issues with what appears to be small abscesses in the sacral region unfortunately. With that being said he has been tolerating the dressing changes without complication which is good news. He's not having any significant discomfort which is also good news. With that being said his daughter states that after he left last week that the packing that we have placed fell out quite rapidly and he subsequently healed over very quick to the point they were not able to even repack the regions. Nonetheless there appear to be several fluctuance areas noted at this point there's one central region that does seem to be draining still discharge that is somewhat green in color. I did review the results of the wound culture which did show evidence of infection with both MRSA as well as pseudomonas based on that result. Nonetheless again with his other current medications we are not able to do the Cipro due  to issues with potential long QT syndrome. I am going to give him a prescription for doxycycline in order to help with the potential MRSA infection. 07/09/18 on evaluation today patient actually appears to be doing about the same in regard to his sacral wound. He actually has his  MRI scheduled for tomorrow and then subsequently is going to be having his infectious disease appointment for Thursday of this week. Fortunately he's not having any significant discomfort he still has your theme in the sacral region he still has several blister/flux went areas although they technically are not blisters this is more like a underlying abscess. The one area that is open still does probe down to bone. Again I am concerned about a deeper infection possibly even sacral osteomyelitis. 07/23/18 on evaluation today patient actually appears to be doing very well all things considered in regard to his sacral ulcer. Since I've last seen him he actually did have his MRI performed which showed that he has a complex fluid collection superficial to the distal sacrum measuring 5.9 x 4.4 x 2.8 cm which abuts the posterior aspect of the distal sacrum and the sacrum itself shows cortical destruction consistent with osteomyelitis. She has also been seen by infectious disease and currently orders have been initiated for IV antibiotic therapy for the next eight weeks. He was seen by Janene Madeira NP and placed on Ceftazidime and Daptomycin. Currently he has not really been on this quite long enough to see a sufficient response to the new orders as far as antibiotic therapy is concerned. Nonetheless it does appear that he is likely on the right track at this point which is great news. Nonetheless the question which both Colletta Maryland and myself have discussed both with the patient and between ourselves is whether or not the patient may need to be seen by surgeon for surgical evacuation of the fluid collection/abscess and possible debridement of the sacrum itself. Nonetheless at this time my personal opinion is probably gonna be that we wait and get this at least a couple weeks to see the response that he receives with the IV antibiotic therapy. 08/06/18 on evaluation today patient actually appears to be doing  rather well in regard to his sacral ulcer region all things considered. He has been tolerating the dressing changes without complication. With that being said there is not any obvious opening nor any drainage noted at this point in time upon evaluation. The patient has been tolerating the dressing changes though. He's doing well with the IV antibiotic therapy. 08/20/18 on evaluation today patient appears to be doing wonderful in regard to his sacral region. In fact there appears to be no wound opening or drainage at this time. Overall I'm very pleased with how things have progressed. The patient likewise as well as his wife and daughter are very happy as well. Readmission: 11/20/18 on evaluation today patient presents for reevaluation our clinic concerning issues with his sacral region. Unfortunately the area in question is the same region which we previously treated what we thought would successfully back in November 2019. At that time the patient underwent IV antibiotic therapy which seemed to do the job very well. Subsequently however in the past couple of weeks he has begun to have drainage and bleeding from the sacral region and is having increased pain yet again. Fortunately there is no signs of systemic infection although it does appear that the complex abscess that was previously noted may not have fully cleared there was a question  between myself as well is infectious disease previous whether not he needed to see a surgeon being that things got better the family opted not to see a surgeon at that time. Nonetheless I am concerned that he may the need to see a surgeon in order to have this area surgically debrided and possibly a bone culture obtained in order to get a better idea of what we're treating and ensure that this is able to completely and fully heal. 11/27/18 on evaluation today patient appears to be doing about the same in regard to his sacral ulcer. He did see Dr. Viviano Simas,  [name] ([id number]) yesterday and the plan is to proceed forward with surgery to clean out the region. This seems to be the most appropriate goal of treatment at this point. Dr. Lysle Pearl just needed to speak with Dr. Ellene Route the patient's neurologist in order to get clearance as the patient was supposed to be scheduled for a carpal tunnel and elbow surgery with Dr. Ellene Route. Nonetheless he has apparently given clearance to proceed with this surgery for the sacral region which is deemed more important at this point. Unfortunately the patient had a little bit of increased pain although he is having redness at this point there does not appear to be any spreading infection which is good news. No fevers, chills, nausea, or vomiting noted at this time. 12/03/18 on evaluation today patient actually appears to be doing about the same in regard to the sacral ulcer. He still waiting to hear back from Dr. Ines Bloomer office in regard to scheduling his surgery. Fortunately there's no signs of systemic infection at this time which is good news. Unfortunately he really does not seem to making any progress the doxycycline does seem to at least be beneficial in helping to hold off the infection from worsening although unfortunately he still has a lot going on in this regard. Patient's daughter states she's actually gonna contact her office tomorrow to see what exactly is going on. 12/10/18 on evaluation today patient actually appears to be doing about the same in regard to her sacral room. Apparently he still waiting on approval from both cardiology as well as Dr. Ellene Route although apparently he's Artie gotten a formal letter from Dr. Ellene Route stated that he was clear to proceed with the sacral surgery. Di Kindle actually found the clearance letter from cardiology in epic as well today and this subsequently was given to the patient's daughter as that seems to be the only thing that was holding up proceeding with the surgery.  Hopefully should be able to take that over to the surgeon's office today in order to go ahead and get this scheduled. 12/24/18 on evaluation today patient actually appears to be doing very well in regard to his sacral ulcer compared to when I last saw him. He has had surgery Dr. Lysle Pearl and it does appear that he was able to clear out this area of abscess very well without complication. This did extend down to the bone and a portion of the bone was sent for pathology and culture. Although on the pathology report it appears this is more tissue and not bone noted. With that being said the culture from the bone and Henrene Pastor asked him this was obtained revealed Corynebacterium striatum with no anaerobes isolated. Dr. Lysle Pearl did not place the patient on the antibiotics at this point. Again I have been debating on whether or not this is the patient to infectious disease I think that I am going  to do that at this point to gain their opinion on whether or not there's anything we should be treated in this regard and if so will be the best treatment options. 12/31/18 on evaluation today patient actually appears to be doing okay in regard to his sacral wound. Fortunately there does not appear to be any evidence of infection at this time which is good news. He has been tolerating the dressing's without complication. With that being said he did see infectious disease and apparently they realize that the pathology samples sent from the operating room was actually. I'll see him and not bone and the question is whether the culture and sample or just contamination from the skin of not guilty and active infection. The daughter is very concerned about this she states that we may need to get a piece of bone to send for examination to ensure there's nothing more severe going on here. Obviously with this history of healing and then obsesses forming and then having to go for surgery now and reopening everything they have a reason  to be concerned I completely agree. Patient History Information obtained from Patient. Family History Cancer - Paternal Grandparents, Diabetes - Father, Heart Disease - Mother,Father, Stroke - Father, No family history of Hypertension, Kidney Disease, Lung Disease, Seizures, Thyroid Problems, Tuberculosis. Social History Never smoker, Marital Status - Widowed, Alcohol Use - Never, Drug Use - No History, Caffeine Use - Daily. Medical History Eyes Patient has history of Cataracts - bilateral removal Denies history of Glaucoma, Optic Neuritis Ear/Nose/Mouth/Throat Denies history of Chronic sinus problems/congestion, Middle ear problems Hematologic/Lymphatic Denies history of Anemia, Hemophilia, Human Immunodeficiency Virus, Lymphedema, Sickle Cell Disease Respiratory Patient has history of Asthma Denies history of Aspiration, Chronic Obstructive Pulmonary Disease (COPD), Pneumothorax, Sleep Apnea, Tuberculosis [name], [name] ([id number]) Cardiovascular Patient has history of Angina, Arrhythmia, Coronary Artery Disease, Hypertension, Myocardial Infarction - 2001 Denies history of Congestive Heart Failure, Deep Vein Thrombosis, Hypotension, Peripheral Arterial Disease, Peripheral Venous Disease, Phlebitis, Vasculitis Gastrointestinal Denies history of Cirrhosis , Colitis, Crohn s, Hepatitis A, Hepatitis B, Hepatitis C Endocrine Denies history of Type I Diabetes, Type II Diabetes Genitourinary Denies history of End Stage Renal Disease Immunological Denies history of Lupus Erythematosus, Raynaud s, Scleroderma Integumentary (Skin) Denies history of History of Burn, History of pressure wounds Musculoskeletal Patient has history of Osteoarthritis Denies history of Gout, Rheumatoid Arthritis, Osteomyelitis Neurologic Patient has history of Dementia, Neuropathy Denies history of Quadriplegia, Paraplegia, Seizure Disorder Oncologic Denies history of Received Chemotherapy, Received  Radiation Psychiatric Denies history of Anorexia/bulimia, Confinement Anxiety Hospitalization/Surgery History - 01/20/2018, [facility], [facility]. 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 Respiratory The patient has no complaints or symptoms. Cardiovascular The patient has no complaints or symptoms. Psychiatric The patient has no complaints or symptoms. Objective Constitutional Well-nourished and well-hydrated in no acute distress. Vitals Time Taken: 2:10 PM, Height: 69 in, Weight: 170.6 lbs, BMI: 25.2, Temperature: 98.2 F, Pulse: 59 bpm, Respiratory Rate: 16 breaths/min, Blood Pressure: 136/70 mmHg. [name], [name] ([id number]) Respiratory normal breathing without difficulty. clear to auscultation bilaterally. Cardiovascular regular rate and rhythm with normal S1, S2. Psychiatric this patient is able to make decisions and demonstrates good insight into disease process. Alert and Oriented x 3. pleasant and cooperative. General Notes: When it comes down to the wound itself I did actually perform sharp debridement today to clear away the slough from the  surface of the wound. As noted in the base of the wound that he did have what appear to be done exposed. Fortunately this does not appear to be significantly necrotic which is good news. With that being said because this was exposing I felt that I could obtain a sample from this location I did actually use the [employer] bone cutters. Subsequently I was able to get samples to sin both for pathology and culture. I did send those today. Integumentary (Hair, Skin) Wound #2 status is Open. Original cause of wound was Gradually Appeared. The wound is located on the Midline Sacrum. The wound measures 2cm length x 1.2cm width x 2cm depth; 1.885cm^2 area and 3.77cm^3 volume. There is Fat  Layer (Subcutaneous Tissue) Exposed exposed. There is no tunneling or undermining noted. There is a medium amount of purulent drainage noted. The wound margin is indistinct and nonvisible. There is small (1-33%) pale, hyper - granulation within the wound bed. There is a medium (34-66%) amount of necrotic tissue within the wound bed including Adherent Slough. The periwound skin appearance exhibited: Erythema. The periwound skin appearance did not exhibit: Callus, Crepitus, Excoriation, Induration, Rash, Scarring, Dry/Scaly, Maceration, Atrophie Blanche, Cyanosis, Ecchymosis, Hemosiderin Staining, Mottled, Pallor, Rubor. The surrounding wound skin color is noted with erythema which is circumferential. Periwound temperature was noted as No Abnormality. The periwound has tenderness on palpation. Assessment Active Problems ICD-10 Pressure ulcer of sacral region, stage 4 Cutaneous abscess of buttock Procedures Wound #2 Pre-procedure diagnosis of Wound #2 is an Atypical located on the Midline Sacrum . There was a Excisional Skin/Subcutaneous Tissue Debridement with a total area of 2.4 sq cm performed by STONE III, HOYT E., PA-C. With the following instrument(s): Curette to remove Non-Viable tissue/material. Material removed includes Subcutaneous Tissue, Slough, and Biofilm after achieving pain control using Lidocaine. A time out was conducted at 14:54, prior to the start of the procedure. A Moderate amount of bleeding was controlled with Pressure. The procedure was tolerated well with a pain level of 0 throughout and a pain level of 0 following the procedure. Post Debridement Measurements: 2cm length x 1.2cm width x 2cm depth; 3.77cm^3 volume. Character of Wound/Ulcer Post Debridement is improved. Post procedure Diagnosis Wound #2: Same as Pre-Procedure [name], [name] ([id number]) Plan Wound Cleansing: Wound #2 Midline Sacrum: Clean wound with Normal Saline. May Shower, gently pat wound dry  prior to applying new dressing. Anesthetic (add to Medication List): Wound #2 Midline Sacrum: Topical Lidocaine 4% cream applied to wound bed prior to debridement (In Clinic Only). Primary Wound Dressing: Wound #2 Midline Sacrum: Other: - Dakins moisten gauze Secondary Dressing: Wound #2 Midline Sacrum: ABD pad XtraSorb Dressing Change Frequency: Wound #2 Midline Sacrum: Change dressing every day. Follow-up Appointments: Wound #2 Midline Sacrum: Return Appointment in 1 week. Off-Loading: Wound #2 Midline Sacrum: Turn and reposition every 2 hours Home Health: Wound #2 Midline Sacrum: [facility] - Dressing change in office on Mondays [place] Nurse may visit PRN to address patient s wound care needs. FACE TO FACE ENCOUNTER: MEDICARE and MEDICAID PATIENTS: I certify that this patient is under my care and that I had a face-to-face encounter that meets the physician face-to-face encounter requirements with this patient on this date. The encounter with the patient was in whole or in part for the following MEDICAL CONDITION: (primary reason for [place]) MEDICAL NECESSITY: I certify, that based on my findings, NURSING services are a medically necessary home health  service. HOME BOUND STATUS: I certify that my clinical findings support that this patient is homebound (i.e., Due to illness or injury, pt requires aid of supportive devices such as crutches, cane, wheelchairs, walkers, the use of special transportation or the assistance of another person to leave their place of residence. There is a normal inability to leave the home and doing so requires considerable and taxing effort. Other absences are for medical reasons / religious services and are infrequent or of short duration when for other reasons). If current dressing causes regression in wound condition, may D/C ordered dressing product/s and apply Normal  Saline Moist Dressing daily until next [place] / Other MD appointment. [place] of regression in wound condition at [phone]. Please direct any NON-WOUND related issues/requests for orders to patient's Primary Care Physician Radiology ordered were: Magnetic Resonance Imaging (MRI) - Sacrum without contrast Laboratory ordered were: Wound culture routine - Bone culture from sacrum area, Biopsy specimen culture - Specimen taken from sacrum My suggestion currently is gonna be that they continue with the current anabiotic regimen as prescribed infectious disease I think this is completely appropriate. Subsequently will see were the results of pathology and culture come back showing and I [name], [name]. ([id number]) did go ahead as well and ordered MRI of the sacral region in route to evaluate for the additional evidence of infection. Patient is in agreement with plan as is his daughter. We will subsequently see were things stand at follow-up. If anything changes or worsens in the meantime he will contact the office and let me know. Please see above for specific wound care orders. We will see patient for re-evaluation in 1 week(s) here in the clinic. If anything worsens or changes patient will contact our office for additional recommendations. Electronic Signature(s) Signed: 01/17/2019 9:13:28 AM By: Worthy Keeler PA-C Previous Signature: 01/01/2019 2:10:29 PM Version By: Worthy Keeler PA-C Entered By: Worthy Keeler on 01/17/2019 09:11:05 [name] ([id number]) -------------------------------------------------------------------------------- ROS/PFSH Details Patient Name: [name] Date of Service: 12/31/2018 2:00 PM Medical Record Number: [id number] Patient Account Number: [id number] Date of Birth/Sex: [birth year] (85 y.o. M) Treating RN: Harold Barban Primary Care Provider: Burman Freestone Other Clinician: Referring Provider: Burman Freestone Treating Provider/Extender: [name], HOYT Weeks in Treatment: 5 Information Obtained From Patient Wound History Do you currently have one or more open woundso Yes How many open wounds do you currently haveo 1 Approximately how long have you had your woundso 2 or 3 weeks How have you been treating your wound(s) until nowo bordered foam dressing Has your wound(s) ever healed and then re-openedo Yes Have you had any lab work done in the past montho No Have you tested positive for an antibiotic resistant organism (MRSA, VRE)o Yes Have you tested positive for osteomyelitis (bone infection)o No Have you had any tests for circulation on your legso No Constitutional Symptoms (General Health) Complaints and Symptoms: Negative for: Fever; Chills Eyes Medical History: Positive for: Cataracts - bilateral removal Negative for: Glaucoma; Optic Neuritis Ear/Nose/Mouth/Throat Medical History: Negative for: Chronic sinus problems/congestion; Middle ear problems Hematologic/Lymphatic Medical History: Negative for: Anemia; Hemophilia; Human Immunodeficiency Virus; Lymphedema; Sickle Cell Disease Respiratory Complaints and Symptoms: No Complaints or Symptoms Medical History: Positive for: Asthma Negative for: Aspiration; Chronic Obstructive Pulmonary Disease (COPD); Pneumothorax; Sleep Apnea; Tuberculosis Cardiovascular Complaints and Symptoms: No Complaints or Symptoms Medical HistoryMarland Kitchen KAMERAN, MCNEESE ([id number]) Positive for: Angina; Arrhythmia; Coronary Artery Disease; Hypertension; Myocardial Infarction - 2001 Negative  for: Congestive Heart Failure; Deep Vein Thrombosis; Hypotension; Peripheral Arterial Disease; Peripheral Venous Disease; Phlebitis; Vasculitis Gastrointestinal Medical History: Negative for: Cirrhosis ; Colitis; Crohnos; Hepatitis A; Hepatitis B; Hepatitis C Endocrine Medical History: Negative for: Type I Diabetes; Type II Diabetes Past Medical History  Notes: Borderline Genitourinary Medical History: Negative for: End Stage Renal Disease Immunological Medical History: Negative for: Lupus Erythematosus; Raynaudos; Scleroderma Integumentary (Skin) Medical History: Negative for: History of Burn; History of pressure wounds Musculoskeletal Medical History: Positive for: Osteoarthritis Negative for: Gout; Rheumatoid Arthritis; Osteomyelitis Neurologic Medical History: Positive for: Dementia; Neuropathy Negative for: Quadriplegia; Paraplegia; Seizure Disorder Oncologic Medical History: Negative for: Received Chemotherapy; Received Radiation Past Medical History Notes: Melanoma on back Psychiatric Complaints and Symptoms: No Complaints or Symptoms Medical History: Negative for: Anorexia/bulimia; Confinement Anxiety HBO Extended History Items MANSON, LUCKADOO ([id number]) Eyes: Cataracts Immunizations Pneumococcal Vaccine: Received Pneumococcal Vaccination: Yes Implantable Devices No devices added Hospitalization / Surgery History Name of Hospital Purpose of Hospitalization/Surgery Date [facility] 01/20/2018 Family and Social History Cancer: Yes - Paternal Grandparents; Diabetes: Yes - Father; Heart Disease: Yes - Mother,Father; Hypertension: No; Kidney Disease: No; Lung Disease: No; Seizures: No; Stroke: Yes - Father; Thyroid Problems: No; Tuberculosis: No; Never smoker; Marital Status - Widowed; Alcohol Use: Never; Drug Use: No History; Caffeine Use: Daily; Financial Concerns: No; Food, Clothing or Shelter Needs: No; Support System Lacking: No; Transportation Concerns: No; Advanced Directives: Yes (Not Provided); Patient does not want information on Advanced Directives; Do not resuscitate: Yes (Not Provided); Living Will: Yes (Not Provided); Medical Power of Attorney: Yes - Nilay Mangrum (Not Provided) Physician Affirmation I have reviewed and agree with the above information. Electronic Signature(s) Signed: 01/01/2019  2:10:29 PM By: Worthy Keeler PA-C Signed: 01/01/2019 4:48:10 PM By: Harold Barban Entered By: Worthy Keeler on 01/01/2019 11:42:05 [name] ([id number]) -------------------------------------------------------------------------------- SuperBill Details Patient Name: [name] Date of Service: 12/31/2018 Medical Record Number: [id number] Patient Account Number: [id number] Date of Birth/Sex: 12/10/1930 (85 y.o. M) Treating RN: Harold Barban Primary Care Provider: Burman Freestone Other Clinician: Referring Provider: Burman Freestone Treating Provider/Extender: [name], HOYT Weeks in Treatment: 5 Diagnosis Coding ICD-10 Codes Code Description L89.154 Pressure ulcer of sacral region, stage 4 L02.31 Cutaneous abscess of buttock Facility Procedures CPT4 Code: [id number] Description: 15400 - DEB SUBQ TISSUE 20 SQ CM/< ICD-10 Diagnosis Description L89.154 Pressure ulcer of sacral region, stage 4 Modifier: Quantity: 1 Physician Procedures CPT4 Code: [id number] Description: 09326 - WC PHYS SUBQ TISS 20 SQ CM ICD-10 Diagnosis Description L89.154 Pressure ulcer of sacral region, stage 4 Modifier: Quantity: 1 Electronic Signature(s) Signed: 01/01/2019 2:10:29 PM By: Worthy Keeler PA-C Entered By: Worthy Keeler on 01/01/2019 11:42:32

## 2019-01-03 LAB — AEROBIC CULTURE W GRAM STAIN (SUPERFICIAL SPECIMEN)
Culture: NO GROWTH
Gram Stain: NONE SEEN

## 2019-01-05 ENCOUNTER — Other Ambulatory Visit: Payer: Self-pay | Admitting: Internal Medicine

## 2019-01-07 ENCOUNTER — Other Ambulatory Visit (HOSPITAL_COMMUNITY): Payer: Self-pay | Admitting: Physician Assistant

## 2019-01-07 ENCOUNTER — Encounter: Payer: Medicare Other | Admitting: Physician Assistant

## 2019-01-07 ENCOUNTER — Other Ambulatory Visit: Payer: Self-pay

## 2019-01-07 ENCOUNTER — Other Ambulatory Visit: Payer: Self-pay | Admitting: Physician Assistant

## 2019-01-07 DIAGNOSIS — E10622 Type 1 diabetes mellitus with other skin ulcer: Secondary | ICD-10-CM | POA: Diagnosis not present

## 2019-01-07 DIAGNOSIS — L89154 Pressure ulcer of sacral region, stage 4: Secondary | ICD-10-CM

## 2019-01-08 ENCOUNTER — Ambulatory Visit: Payer: Medicare Other

## 2019-01-08 ENCOUNTER — Other Ambulatory Visit: Payer: Self-pay | Admitting: Internal Medicine

## 2019-01-10 NOTE — Progress Notes (Signed)
[name], [name] ([id number]) Visit Report for 01/07/2019 Arrival Information Details Patient Name: [name], [name]. Date of Service: 01/07/2019 2:00 PM Medical Record Number: [id number] Patient Account Number: [id number] Date of Birth/Sex: 02-09-1931 (85 y.o. M) Treating RN: Harold Barban Primary Care Latash Nouri: Burman Freestone Other Clinician: Referring Reeya Bound: Burman Freestone Treating Sylus Stgermain/Extender: [name], HOYT Weeks in Treatment: 6 Visit Information History Since Last Visit Added or deleted any medications: No Patient Arrived: Cane Any new allergies or adverse reactions: No Arrival Time: 13:55 Had a fall or experienced change in No Accompanied By: daughter activities of daily living that may affect Transfer Assistance: None risk of falls: Patient Identification Verified: Yes Signs or symptoms of abuse/neglect since last visito No Secondary Verification Process Yes Hospitalized since last visit: No Completed: Implantable device outside of the clinic excluding No Patient Has Alerts: Yes cellular tissue based products placed in the center Patient Alerts: Patient on Blood since last visit: Thinner Has Dressing in Place as Prescribed: Yes Pain Present Now: No Electronic Signature(s) Signed: 01/07/2019 4:01:59 PM By: Lorine Bears RCP, RRT, CHT Entered By: Lorine Bears on 01/07/2019 13:56:11 [name] ([id number]) -------------------------------------------------------------------------------- Clinic Level of Care Assessment Details Patient Name: [name] Date of Service: 01/07/2019 2:00 PM Medical Record Number: [id number] Patient Account Number: [id number] Date of Birth/Sex: [birth year] (85 y.o. M) Treating RN: Harold Barban Primary Care Alberto Pina: Burman Freestone Other Clinician: Referring Tamie Minteer: Burman Freestone Treating Ryszard Socarras/Extender: [name], HOYT Weeks in Treatment: 6 Clinic Level of Care Assessment  Items TOOL 4 Quantity Score []  - Use when only an EandM is performed on FOLLOW-UP visit 0 ASSESSMENTS - Nursing Assessment / Reassessment X - Reassessment of Co-morbidities (includes updates in patient status) 1 10 X- 1 5 Reassessment of Adherence to Treatment Plan ASSESSMENTS - Wound and Skin Assessment / Reassessment X - Simple Wound Assessment / Reassessment - one wound 1 5 []  - 0 Complex Wound Assessment / Reassessment - multiple wounds []  - 0 Dermatologic / Skin Assessment (not related to wound area) ASSESSMENTS - Focused Assessment []  - Circumferential Edema Measurements - multi extremities 0 []  - 0 Nutritional Assessment / Counseling / Intervention []  - 0 Lower Extremity Assessment (monofilament, tuning fork, pulses) []  - 0 Peripheral Arterial Disease Assessment (using hand held doppler) ASSESSMENTS - Ostomy and/or Continence Assessment and Care []  - Incontinence Assessment and Management 0 []  - 0 Ostomy Care Assessment and Management (repouching, etc.) PROCESS - Coordination of Care X - Simple Patient / Family Education for ongoing care 1 15 []  - 0 Complex (extensive) Patient / Family Education for ongoing care []  - 0 Staff obtains Programmer, systems, Records, Test Results / Process Orders X- 1 10 Staff telephones HHA, Nursing Homes / Clarify orders / etc []  - 0 Routine Transfer to another Facility (non-emergent condition) []  - 0 Routine Hospital Admission (non-emergent condition) []  - 0 New Admissions / Biomedical engineer / Ordering NPWT, Apligraf, etc. []  - 0 Emergency Hospital Admission (emergent condition) X- 1 10 Simple Discharge Coordination [name], [name] ([id number]) []  - 0 Complex (extensive) Discharge Coordination PROCESS - Special Needs []  - Pediatric / Minor Patient Management 0 []  - 0 Isolation Patient Management []  - 0 Hearing / Language / Visual special needs []  - 0 Assessment of Community assistance (transportation, D/C planning, etc.) []  -  0 Additional assistance / Altered mentation []  - 0 Support Surface(s) Assessment (bed, cushion, seat, etc.) INTERVENTIONS - Wound Cleansing / Measurement X - Simple Wound Cleansing - one wound 1  5 []  - 0 Complex Wound Cleansing - multiple wounds X- 1 5 Wound Imaging (photographs - any number of wounds) []  - 0 Wound Tracing (instead of photographs) X- 1 5 Simple Wound Measurement - one wound []  - 0 Complex Wound Measurement - multiple wounds INTERVENTIONS - Wound Dressings X - Small Wound Dressing one or multiple wounds 1 10 []  - 0 Medium Wound Dressing one or multiple wounds []  - 0 Large Wound Dressing one or multiple wounds []  - 0 Application of Medications - topical []  - 0 Application of Medications - injection INTERVENTIONS - Miscellaneous []  - External ear exam 0 []  - 0 Specimen Collection (cultures, biopsies, blood, body fluids, etc.) []  - 0 Specimen(s) / Culture(s) sent or taken to Lab for analysis []  - 0 Patient Transfer (multiple staff / Civil Service fast streamer / Similar devices) []  - 0 Simple Staple / Suture removal (25 or less) []  - 0 Complex Staple / Suture removal (26 or more) []  - 0 Hypo / Hyperglycemic Management (close monitor of Blood Glucose) []  - 0 Ankle / Brachial Index (ABI) - do not check if billed separately X- 1 5 Vital Signs [name], [name] ([id number]) Has the patient been seen at the hospital within the last three years: Yes Total Score: 85 Level Of Care: New/Established - Level 3 Electronic Signature(s) Signed: 01/08/2019 9:04:30 AM By: Harold Barban Entered By: Harold Barban on 01/07/2019 14:25:12 [name] ([id number]) -------------------------------------------------------------------------------- Encounter Discharge Information Details Patient Name: [name] Date of Service: 01/07/2019 2:00 PM Medical Record Number: [id number] Patient Account Number: [id number] Date of Birth/Sex: [birth year] (85 y.o. M) Treating RN: Army Melia Primary Care Kassia Demarinis: Burman Freestone Other Clinician: Referring Selah Zelman: Burman Freestone Treating Cola Gane/Extender: [name], HOYT Weeks in Treatment: 6 Encounter Discharge Information Items Discharge Condition: Stable Ambulatory Status: Ambulatory Discharge Destination: Home Transportation: Private Auto Accompanied By: daughter Schedule Follow-up Appointment: Yes Clinical Summary of Care: Electronic Signature(s) Signed: 01/08/2019 9:14:28 AM By: Army Melia Entered By: Army Melia on 01/07/2019 14:41:48 [name] ([id number]) -------------------------------------------------------------------------------- Lower Extremity Assessment Details Patient Name: [name] Date of Service: 01/07/2019 2:00 PM Medical Record Number: [id number] Patient Account Number: [id number] Date of Birth/Sex: Feb 19, 1931 (85 y.o. M) Treating RN: Montey Hora Primary Care Geovannie Vilar: Burman Freestone Other Clinician: Referring Zion Ta: Burman Freestone Treating Lamica Mccart/Extender: [name], HOYT Weeks in Treatment: 6 Electronic Signature(s) Signed: 01/07/2019 3:31:49 PM By: Montey Hora Entered By: Montey Hora on 01/07/2019 14:05:07 [name] ([id number]) -------------------------------------------------------------------------------- Multi Wound Chart Details Patient Name: [name] Date of Service: 01/07/2019 2:00 PM Medical Record Number: [id number] Patient Account Number: [id number] Date of Birth/Sex: 02-01-1931 (85 y.o. M) Treating RN: Harold Barban Primary Care Torrence Branagan: Burman Freestone Other Clinician: Referring Clary Boulais: Burman Freestone Treating Vickki Igou/Extender: [name], HOYT Weeks in Treatment: 6 Vital Signs Height(in): 69 Pulse(bpm): 68 Weight(lbs): 170.6 Blood Pressure(mmHg): 136/71 Body Mass Index(BMI): 25 Temperature(F): 97.7 Respiratory Rate 16 (breaths/min): Photos: [2:No Photos] [N/A:N/A] Wound Location: [2:Sacrum -  Midline] [N/A:N/A] Wounding Event: [2:Bump] [N/A:N/A] Primary Etiology: [2:Atypical] [N/A:N/A] Comorbid History: [2:Cataracts, Asthma, Angina, Arrhythmia, Coronary Artery Disease, Hypertension, Myocardial Infarction, Osteoarthritis, Dementia, Neuropathy] [N/A:N/A] Date Acquired: [2:10/30/2018] [N/A:N/A] Weeks of Treatment: [2:6] [N/A:N/A] Wound Status: [2:Open] [N/A:N/A] Measurements L x W x D [2:2.3x1.7x2.2] [N/A:N/A] (cm) Area (cm) : [2:3.071] [N/A:N/A] Volume (cm) : [2:6.756] [N/A:N/A] % Reduction in Area: [2:-38287.50%] [N/A:N/A] % Reduction in Volume: [2:-33680.00%] [N/A:N/A] Classification: [2:Full Thickness With Exposed Support Structures] [N/A:N/A] Exudate Amount: [2:Medium] [N/A:N/A] Exudate Type: [2:Purulent] [N/A:N/A] Exudate Color: [2:yellow, brown, green] [  N/A:N/A] Wound Margin: [2:Indistinct, nonvisible] [N/A:N/A] Granulation Amount: [2:Small (1-33%)] [N/A:N/A] Granulation Quality: [2:Pale, Hyper-granulation] [N/A:N/A] Necrotic Amount: [2:Medium (34-66%)] [N/A:N/A] Exposed Structures: [2:Fat Layer (Subcutaneous Tissue) Exposed: Yes Bone: Yes Fascia: No Tendon: No Muscle: No Joint: No] [N/A:N/A] Epithelialization: [2:Medium (34-66%)] [N/A:N/A] Periwound Skin Texture: [N/A:N/A] Excoriation: No Induration: No Callus: No Crepitus: No Rash: No Scarring: No Periwound Skin Moisture: Maceration: No N/A N/A Dry/Scaly: No Periwound Skin Color: Erythema: Yes N/A N/A Atrophie Blanche: No Cyanosis: No Ecchymosis: No Hemosiderin Staining: No Mottled: No Pallor: No Rubor: No Erythema Location: Circumferential N/A N/A Temperature: No Abnormality N/A N/A Tenderness on Palpation: Yes N/A N/A Wound Preparation: Ulcer Cleansing: N/A N/A Rinsed/Irrigated with Saline Topical Anesthetic Applied: Other: lidocaine 4% Treatment Notes Electronic Signature(s) Signed: 01/08/2019 9:04:30 AM By: Harold Barban Entered By: Harold Barban on 01/07/2019 14:21:02 [name] ([id number]) -------------------------------------------------------------------------------- [place] Details Patient Name: [name], [name] Date of Service: 01/07/2019 2:00 PM Medical Record Number: [id number] Patient Account Number: [id number] Date of Birth/Sex: [birth year] (85 y.o. M) Treating RN: Harold Barban Primary Care Kyarra Vancamp: Burman Freestone Other Clinician: Referring Shakim Faith: Burman Freestone Treating Minard Millirons/Extender: [name], HOYT Weeks in Treatment: 6 Active Inactive Abuse / Safety / Falls / Self Care Management Nursing Diagnoses: Potential for falls Goals: Patient will not experience any injury related to falls Date Initiated: 11/20/2018 Target Resolution Date: 02/15/2019 Goal Status: Active Interventions: Assess fall risk on admission and as needed Notes: Orientation to the Wound Care Program Nursing Diagnoses: Knowledge deficit related to the wound healing center program Goals: Patient/caregiver will verbalize understanding of the [place] [facility] Date Initiated: 11/20/2018 Target Resolution Date: 02/15/2019 Goal Status: Active Interventions: Provide education on orientation to the wound center Notes: Wound/Skin Impairment Nursing Diagnoses: Impaired tissue integrity Goals: Ulcer/skin breakdown will heal within 14 weeks Date Initiated: 11/20/2018 Target Resolution Date: 02/15/2019 Goal Status: Active Interventions: Assess patient/caregiver ability to obtain necessary supplies [name], [name] ([id number]) Assess patient/caregiver ability to perform ulcer/skin care regimen upon admission and as needed Assess ulceration(s) every visit Notes: Electronic Signature(s) Signed: 01/08/2019 9:04:30 AM By: Harold Barban Entered By: Harold Barban on 01/07/2019 14:20:51 [name] ([id number]) -------------------------------------------------------------------------------- Pain Assessment Details Patient Name: [name] Date of Service: 01/07/2019 2:00 PM Medical Record Number: [id number] Patient Account Number: [id number] Date of Birth/Sex: Jan 14, 1931 (85 y.o. M) Treating RN: Harold Barban Primary Care Tamsen Reist: Burman Freestone Other Clinician: Referring Ismeal Heider: Burman Freestone Treating Ashira Kirsten/Extender: [name], HOYT Weeks in Treatment: 6 Active Problems Location of Pain Severity and Description of Pain Patient Has Paino No Site Locations Pain Management and Medication Current Pain Management: Electronic Signature(s) Signed: 01/07/2019 4:01:59 PM By: Lorine Bears RCP, RRT, CHT Signed: 01/08/2019 9:04:30 AM By: Harold Barban Entered By: Lorine Bears on 01/07/2019 13:56:20 [name] ([id number]) -------------------------------------------------------------------------------- Patient/Caregiver Education Details Patient Name: [name], BARBARY. Date of Service: 01/07/2019 2:00 PM Medical Record Number: [id number] Patient Account Number: [id number] Date of Birth/Gender: 06/27/1931 (85 y.o. M) Treating RN: Harold Barban Primary Care Physician: Burman Freestone Other Clinician: Referring Physician: Burman Freestone Treating Physician/Extender: Sharalyn Ink in Treatment: 6 Education Assessment Education Provided To: Patient Education Topics Provided Wound/Skin Impairment: Handouts: Caring for Your Ulcer Methods: Demonstration, Explain/Verbal Responses: State content correctly Electronic Signature(s) Signed: 01/08/2019 9:04:30 AM By: Harold Barban Entered By: Harold Barban on 01/07/2019 14:21:24 [name] ([id number]) -------------------------------------------------------------------------------- Wound Assessment Details Patient Name: [name] Date of Service: 01/07/2019 2:00 PM Medical Record Number: [id number] Patient Account Number: [id number]  Date of Birth/Sex: June 28, 1931 (85 y.o. M) Treating RN: Montey Hora Primary Care Canon Gola: Burman Freestone Other Clinician: Referring Danyetta Gillham: Burman Freestone Treating Jeffie Spivack/Extender: [name], HOYT Weeks in Treatment: 6 Wound Status Wound Number: 2 Primary Atypical Etiology: Wound Location: Sacrum - Midline Wound Open Wounding Event: Bump Status: Date Acquired: 10/30/2018 Comorbid Cataracts, Asthma, Angina, Arrhythmia, Weeks Of Treatment: 6 History: Coronary Artery Disease, Hypertension, Clustered Wound: No Myocardial Infarction, Osteoarthritis, Dementia, Neuropathy Photos Photo Uploaded By: Montey Hora on 01/07/2019 15:19:18 Wound Measurements Length: (cm) 2.3 Width: (cm) 1.7 Depth: (cm) 2.2 Area: (cm) 3.071 Volume: (cm) 6.756 % Reduction in Area: -38287.5% % Reduction in Volume: -33680% Epithelialization: Medium (34-66%) Tunneling: No Undermining: No Wound Description Full Thickness With Exposed Support Foul Classification: Structures Sloug Wound Margin: Indistinct, nonvisible Exudate Medium Amount: Exudate Type: Purulent Exudate Color: yellow, brown, green Odor After Cleansing: No h/Fibrino No Wound Bed Granulation Amount: Small (1-33%) Exposed Structure Granulation Quality: Pale, Hyper-granulation Fascia Exposed: No Necrotic Amount: Medium (34-66%) Fat Layer (Subcutaneous Tissue) Exposed: Yes Necrotic Quality: Adherent Slough Tendon Exposed: No Muscle Exposed: No Joint Exposed: No [name], [name] ([id number]) Bone Exposed: Yes Periwound Skin Texture Texture Color No Abnormalities Noted: No No Abnormalities Noted: No Callus: No Atrophie Blanche: No Crepitus: No Cyanosis: No Excoriation: No Ecchymosis: No Induration: No Erythema: Yes Rash: No Erythema Location: Circumferential Scarring: No Hemosiderin Staining: No Mottled: No Moisture Pallor: No No Abnormalities Noted: No Rubor: No Dry / Scaly: No Maceration: No Temperature / Pain Temperature: No Abnormality Tenderness on  Palpation: Yes Wound Preparation Ulcer Cleansing: Rinsed/Irrigated with Saline Topical Anesthetic Applied: Other: lidocaine 4%, Treatment Notes Wound #2 (Midline Sacrum) Notes silver cell, BFD Electronic Signature(s) Signed: 01/07/2019 3:31:49 PM By: Montey Hora Entered By: Montey Hora on 01/07/2019 14:11:39 [name] ([id number]) -------------------------------------------------------------------------------- Vitals Details Patient Name: [name] Date of Service: 01/07/2019 2:00 PM Medical Record Number: [id number] Patient Account Number: [id number] Date of Birth/Sex: 05/09/1931 (85 y.o. M) Treating RN: Harold Barban Primary Care Tanishka Drolet: Burman Freestone Other Clinician: Referring Aryn Safran: Burman Freestone Treating Denson Niccoli/Extender: [name], HOYT Weeks in Treatment: 6 Vital Signs Time Taken: 13:56 Temperature (F): 97.7 Height (in): 69 Pulse (bpm): 68 Weight (lbs): 170.6 Respiratory Rate (breaths/min): 16 Body Mass Index (BMI): 25.2 Blood Pressure (mmHg): 136/71 Reference Range: 80 - 120 mg / dl Electronic Signature(s) Signed: 01/07/2019 4:01:59 PM By: Lorine Bears RCP, RRT, CHT Entered By: Lorine Bears on 01/07/2019 13:58:20

## 2019-01-10 NOTE — Progress Notes (Signed)
[name], [name] ([id number]) Visit Report for 01/07/2019 Chief Complaint Document Details Patient Name: [name], [name]. Date of Service: 01/07/2019 2:00 PM Medical Record Number: [id number] Patient Account Number: [id number] Date of Birth/Sex: [birth year] (85 y.o. M) Treating RN: Harold Barban Primary Care Provider: Burman Freestone Other Clinician: Referring Provider: Burman Freestone Treating Provider/Extender: [name], Alvine Mostafa Weeks in Treatment: 6 Information Obtained from: Patient Chief Complaint Sacral pressure ulcer Electronic Signature(s) Signed: 01/09/2019 11:01:46 PM By: Worthy Keeler PA-C Entered By: Worthy Keeler on 01/07/2019 14:05:33 [name] ([id number]) -------------------------------------------------------------------------------- HPI Details Patient Name: [name] Date of Service: 01/07/2019 2:00 PM Medical Record Number: [id number] Patient Account Number: [id number] Date of Birth/Sex: 03-27-1931 (85 y.o. M) Treating RN: Harold Barban Primary Care Provider: Burman Freestone Other Clinician: Referring Provider: Burman Freestone Treating Provider/Extender: [name], Holly Iannaccone Weeks in Treatment: 6 History of Present Illness HPI Description: 02/06/18 on evaluation today patient presents for initial evaluation and our clinic concerning an issue which began roughly 3 weeks ago when the patient fell in his home on the floor in his kitchen and laid him down this detergent for roughly 3 days. He had a pressure injury to the left shoulder. This unfortunately has caused him a lot of discomfort although it finally seems to be doing better if anything is really having a lot of itching right now. This appears potentially be a contact dermatitis issue. He also has a significant pressure injury to the sacrum at this time as well which is also showing fascia exposure right over the bone but no evidence of bone exposure at this point which is good news. They have  been using Santyl as well as Saline soaked gauze at this point in time. There does appear to be a lot of necrotic slough in the base of the wound. He does have a history of incontinence, myocardial infarction, and hypertension. He also is "borderline diabetic" hemoglobin A1c of 6.0. Currently he has some discomfort in the pressure site at the sacrum but fortunately nothing too significant this did require sharp debridement today. 02/13/18 on evaluation today patient appears to be doing much better in regard to his sacral wound. He has been tolerating the dressing changes without complication with the Vashe. Fortunately there is no evidence of infection and though there is some Slough on the surface of the wound bed he has excellent granulation noted. Overall I'm pleased with how things have progressed in that regard. A glance at his shoulder as well and the rash seems to be someone improving in my pinion at this site as well. Overall I am pleased with what we're seeing and so is the family. 02/20/18 on evaluation today patient appears to be doing a little worse in regard to the sacral wound only in the fact that there is redness surrounding it has me somewhat concerned for infection. The drainage has also apparently been a little bit off color compared to normal according to family they did keep the dressing today that was removed to show me and I agree this seems to be a little bit different compared to what we have been seeing. Coupled with the redness I'm concerned he may be developing some cellulitis surrounding the wound bed. 02/27/18 on evaluation today patient presents for follow-up concerning his sacral ulcer. We have received the results back from his wound culture which shows unfortunately that the doxycycline will not be of benefit for him I am going to need to initiate treatment with something else  in order to treat the pseudomonas. Otherwise he does not seem to be having any  significant pain although his daughter states there are sometimes when he states having pain. We continue to use the Vashe currently. 03/06/18 on evaluation today patient's sacral wound appears to be doing better in my opinion. He has been tolerating the dressing changes without complication. With that being said the silver nitrate has helped with the prominent area of hyper granulation at the 6 o'clock location we will likely need to repeat this again today. Nonetheless overall I am pleased with how things have improved over the last week. The erythema surrounding the wound seems to be greatly improved. 03/13/18 on evaluation today patient's wound actually does not appear to be terribly infected although she does continue to have erythema surrounding the wound bed especially on the left border. I'm still somewhat concerned about the fact that the oral antibiotics alone may not be completely treating his infection. I previously discussed may need to go for IV antibiotic therapy I'm concerned that may be the case. We will need to make a referral today for infectious disease. 03/20/18 on evaluation today the patient sacral wound actually appears to be doing fairly well in regard to granulation although he continues unfortunately to have it your theme is surrounding the periwound region. There's also some increased swelling at the 6 o'clock location which also has me somewhat concerned. With that being said he does have some discomfort but nothing too significant at this point. He still has not heard from infectious disease his daughter and wife are both present during the office visit today they're going to check back with this again. We did get the information for them to call them today. 03/27/18 on evaluation today patient is seen concerning his ongoing sacral ulcer. He has been tolerating the dressing changes without complication. With that being said he does present with evidence of bright green  drainage noted on the dressing which again is something that I do often expect to see with a pseudomonas infection. He continues to have your theme is SHIGERU, LAMPERT ([id number]) surrounding the wound bed as well and again I'm not 100% convinced this is just pressure related. I did speak with Colletta Maryland who is the nurse practitioner in [place] with infectious disease. I spoke with her actually yesterday concerning this patient. She is not 100% convinced that this is infected. She question whether the wound may just be colonized with Pseudomonas and not actually causing an active infection. I am really not thinking that the edema is associated with pressure alone and again overall I don't feel that your theme and is consistent with a pressure injury either as he's never had any contusions noted like a deep tissue injury on his heel which was new and I did visualize today this was on the left heel. Nonetheless she wanted to give this a little bit more time and thought it would be appropriate to start the Wound VAC at this point. 04/03/18 on evaluation today patient sacral ulcer actually appears to be doing fairly well at this point. He has been tolerating the dressing changes without complication. With that being said I'm very pleased with the progress that has been made in regard to his sacral wound over the past week I do not see as much in the way of erythema which is great news. Nonetheless he does have a small area of hyper granulation unfortunately. This is at roughly the 7 o'clock location and I  think does need to be addressed so that this will heal more appropriately. Nonetheless I think we may be ready to go ahead and initiate therapy with the Wound VAC. 04/10/18 on evaluation today patient appears to be doing excellent in regard to his sacral ulcer. The show signs of great improvement in overall I'm very pleased with how things look. He has been tolerating the dressing changes  without complication. Specifically this is the Wound VAC. He also seems to be doing well with the antibiotic there is decreased your theme and redness surrounding the sacral area at this point in the wound has filled in quite significantly. 04/17/18 on evaluation today patient actually appears to be doing excellent in regard to his sacral ulcer. He's been tolerating the dressing changes without complication specifically the Wound VAC. There really are no major concerns from the patient nor family this point he is having no pain. He does have a little bit of Epiboly on the lateral portions of the wound where he does have a little bit more depth that will need to be addressed today. 04/23/18 on evaluation today patient's wound actually appears to be doing excellent at this point. He has been tolerating the Wound VAC and this appears to be doing well other than the fact that it seems to be breaking seal at the 6 o'clock location. I do believe that adding a duodenum dressing at this location try and help maintain the seal would be appropriate and likely very effective. With that being said he overall seems to be showing signs of good improvement at this point. There does not appear to be any evidence of significant infection which is also excellent news. 04/30/18 on evaluation today patient actually appears to be doing well in regard to his sacral ulcer. He's been tolerating the dressing changes without complication. Fortunately there does not appear to be any evidence of infection. Overall I'm very pleased with the progress that has been made up to this point. He does have some blistering underneath the draping unfortunately although this is definitely something that has been noted on other patients previously is a fairly common occurrence. Nonetheless the patient seems to be doing fairly well in general in my pinion based on what I see at this time. I do believe these are fairly superficial and  minor. 05/07/18 on evaluation today patient's wound actually appears to be doing excellent at this point in time. He has been tolerating the Wound VAC decently well he states that it is somewhat cumbersome to carry around unfortunately. The only other issue he's been having according to family is that they been having a difficult time keeping the Wound VAC in place and doing what is supposed to do without making. Obviously I do think that this is definitely of concern. Nonetheless I do believe she's made good progress up to this point. I'm very happy in that regard. 05/14/18 on evaluation today patient's wound continues to make good progress at this point. He had a minimal amount of slough noted on the surface which was easily wiped away with saline and gauze and in general I feel like that he is continuing to show excellent progress even with the discontinuation of the Wound VAC. Overall I'm pleased in this regard. He was having issues with the Wound VAC in getting it to seal I think that using the Prisma at this time has been equally efficient and getting the wound to diminish in size. 05/29/18- He is here in follow up  evaluation for a sacral ulcer. There is improvement, we will continue with prisma and he will follow up in two weeks 06/11/18 on evaluation today patient had unfortunately bright green drainage on the dressing upon evaluation today. This is something that we have encountered before although we were able to get things under control previously with antibiotics. With that being said currently upon further inspection of the three areas of hyper granulation that were separate from the actual wound itself which was almost healed it really appears that these all have some depth to them. They are more tunnels that really have not closed or at least have reopened as a result likely of infection in my pinion. This is definitely not what I was expecting or hoping for. [name], [name]  ([id number]) 06/26/18 on evaluation today patient continues to experience issues with what appears to be small abscesses in the sacral region unfortunately. With that being said he has been tolerating the dressing changes without complication which is good news. He's not having any significant discomfort which is also good news. With that being said his daughter states that after he left last week that the packing that we have placed fell out quite rapidly and he subsequently healed over very quick to the point they were not able to even repack the regions. Nonetheless there appear to be several fluctuance areas noted at this point there's one central region that does seem to be draining still discharge that is somewhat green in color. I did review the results of the wound culture which did show evidence of infection with both MRSA as well as pseudomonas based on that result. Nonetheless again with his other current medications we are not able to do the Cipro due to issues with potential long QT syndrome. I am going to give him a prescription for doxycycline in order to help with the potential MRSA infection. 07/09/18 on evaluation today patient actually appears to be doing about the same in regard to his sacral wound. He actually has his MRI scheduled for tomorrow and then subsequently is going to be having his infectious disease appointment for Thursday of this week. Fortunately he's not having any significant discomfort he still has your theme in the sacral region he still has several blister/flux went areas although they technically are not blisters this is more like a underlying abscess. The one area that is open still does probe down to bone. Again I am concerned about a deeper infection possibly even sacral osteomyelitis. 07/23/18 on evaluation today patient actually appears to be doing very well all things considered in regard to his sacral ulcer. Since I've last seen him he actually did have  his MRI performed which showed that he has a complex fluid collection superficial to the distal sacrum measuring 5.9 x 4.4 x 2.8 cm which abuts the posterior aspect of the distal sacrum and the sacrum itself shows cortical destruction consistent with osteomyelitis. She has also been seen by infectious disease and currently orders have been initiated for IV antibiotic therapy for the next eight weeks. He was seen by Janene Madeira NP and placed on Ceftazidime and Daptomycin. Currently he has not really been on this quite long enough to see a sufficient response to the new orders as far as antibiotic therapy is concerned. Nonetheless it does appear that he is likely on the right track at this point which is great news. Nonetheless the question which both Colletta Maryland and myself have discussed both with the patient and between  ourselves is whether or not the patient may need to be seen by surgeon for surgical evacuation of the fluid collection/abscess and possible debridement of the sacrum itself. Nonetheless at this time my personal opinion is probably gonna be that we wait and get this at least a couple weeks to see the response that he receives with the IV antibiotic therapy. 08/06/18 on evaluation today patient actually appears to be doing rather well in regard to his sacral ulcer region all things considered. He has been tolerating the dressing changes without complication. With that being said there is not any obvious opening nor any drainage noted at this point in time upon evaluation. The patient has been tolerating the dressing changes though. He's doing well with the IV antibiotic therapy. 08/20/18 on evaluation today patient appears to be doing wonderful in regard to his sacral region. In fact there appears to be no wound opening or drainage at this time. Overall I'm very pleased with how things have progressed. The patient likewise as well as his wife and daughter are very happy as  well. Readmission: 11/20/18 on evaluation today patient presents for reevaluation our clinic concerning issues with his sacral region. Unfortunately the area in question is the same region which we previously treated what we thought would successfully back in November 2019. At that time the patient underwent IV antibiotic therapy which seemed to do the job very well. Subsequently however in the past couple of weeks he has begun to have drainage and bleeding from the sacral region and is having increased pain yet again. Fortunately there is no signs of systemic infection although it does appear that the complex abscess that was previously noted may not have fully cleared there was a question between myself as well is infectious disease previous whether not he needed to see a surgeon being that things got better the family opted not to see a surgeon at that time. Nonetheless I am concerned that he may the need to see a surgeon in order to have this area surgically debrided and possibly a bone culture obtained in order to get a better idea of what we're treating and ensure that this is able to completely and fully heal. 11/27/18 on evaluation today patient appears to be doing about the same in regard to his sacral ulcer. He did see Dr. Lysle Pearl yesterday and the plan is to proceed forward with surgery to clean out the region. This seems to be the most appropriate goal of treatment at this point. Dr. Lysle Pearl just needed to speak with Dr. Ellene Route the patient's neurologist in order to get clearance as the patient was supposed to be scheduled for a carpal tunnel and elbow surgery with Dr. Ellene Route. Nonetheless he has apparently given clearance to proceed with this surgery for the sacral region which is deemed more important at this point. Unfortunately the patient had a little bit of increased pain although he is having redness at this point there does not appear to be any spreading infection which is good news. No  fevers, chills, nausea, or vomiting noted at this time. [name], [name] ([id number]) 12/03/18 on evaluation today patient actually appears to be doing about the same in regard to the sacral ulcer. He still waiting to hear back from Dr. Ines Bloomer office in regard to scheduling his surgery. Fortunately there's no signs of systemic infection at this time which is good news. Unfortunately he really does not seem to making any progress the doxycycline does seem to at least  be beneficial in helping to hold off the infection from worsening although unfortunately he still has a lot going on in this regard. Patient's daughter states she's actually gonna contact her office tomorrow to see what exactly is going on. 12/10/18 on evaluation today patient actually appears to be doing about the same in regard to her sacral room. Apparently he still waiting on approval from both cardiology as well as Dr. Ellene Route although apparently he's Artie gotten a formal letter from Dr. Ellene Route stated that he was clear to proceed with the sacral surgery. Di Kindle actually found the clearance letter from cardiology in epic as well today and this subsequently was given to the patient's daughter as that seems to be the only thing that was holding up proceeding with the surgery. Hopefully should be able to take that over to the surgeon's office today in order to go ahead and get this scheduled. 12/24/18 on evaluation today patient actually appears to be doing very well in regard to his sacral ulcer compared to when I last saw him. He has had surgery Dr. Lysle Pearl and it does appear that he was able to clear out this area of abscess very well without complication. This did extend down to the bone and a portion of the bone was sent for pathology and culture. Although on the pathology report it appears this is more tissue and not bone noted. With that being said the culture from the bone and Henrene Pastor asked him this was obtained revealed  Corynebacterium striatum with no anaerobes isolated. Dr. Lysle Pearl did not place the patient on the antibiotics at this point. Again I have been debating on whether or not this is the patient to infectious disease I think that I am going to do that at this point to gain their opinion on whether or not there's anything we should be treated in this regard and if so will be the best treatment options. 12/31/18 on evaluation today patient actually appears to be doing okay in regard to his sacral wound. Fortunately there does not appear to be any evidence of infection at this time which is good news. He has been tolerating the dressing's without complication. With that being said he did see infectious disease and apparently they realize that the pathology samples sent from the operating room was actually. I'll see him and not bone and the question is whether the culture and sample or just contamination from the skin of not guilty and active infection. The daughter is very concerned about this she states that we may need to get a piece of bone to send for examination to ensure there's nothing more severe going on here. Obviously with this history of healing and then obsesses forming and then having to go for surgery now and reopening everything they have a reason to be concerned I completely agree. 01/07/19 on evaluation today patient actually appears to be doing well in regard to his sacral wound. Again we did get results back of his bone culture as well as the pathology which showed no evidence of osteomyelitis at this point this is excellent news. Overall I think that he likely does not need to go forward with the MRI since everything seems to be showing negative at this time. Especially in light of the fact that the wound appears to be doing well and that the infection and everything was running the wound bed seems to be dramatically improved. Patient's daughter is in agreement with this plan. Electronic  Signature(s) Signed: 01/09/2019  11:01:46 PM By: Worthy Keeler PA-C Entered By: Worthy Keeler on 01/07/2019 23:34:32 [name] ([id number]) -------------------------------------------------------------------------------- Physical Exam Details Patient Name: TOR, TSUDA Date of Service: 01/07/2019 2:00 PM Medical Record Number: [id number] Patient Account Number: [id number] Date of Birth/Sex: [birth year] (85 y.o. M) Treating RN: Harold Barban Primary Care Provider: Burman Freestone Other Clinician: Referring Provider: Burman Freestone Treating Provider/Extender: STONE III, Aletheia Tangredi Weeks in Treatment: 6 Constitutional Well-nourished and well-hydrated in no acute distress. Respiratory normal breathing without difficulty. clear to auscultation bilaterally. Cardiovascular regular rate and rhythm with normal S1, S2. Psychiatric this patient is able to make decisions and demonstrates good insight into disease process. Alert and Oriented x 3. pleasant and cooperative. Notes Patient's wound bed currently did have some Slough noted although I was able to clean this off with saline and gauze without complication today. Post debridement the wound bed appears to be doing much better which is excellent news. Electronic Signature(s) Signed: 01/09/2019 11:01:46 PM By: Worthy Keeler PA-C Entered By: Worthy Keeler on 01/07/2019 23:35:28 [name] ([id number]) -------------------------------------------------------------------------------- Physician Orders Details Patient Name: GOTTI, ALWIN Date of Service: 01/07/2019 2:00 PM Medical Record Number: [id number] Patient Account Number: [id number] Date of Birth/Sex: 23-Jan-1931 (85 y.o. M) Treating RN: Harold Barban Primary Care Provider: Burman Freestone Other Clinician: Referring Provider: Burman Freestone Treating Provider/Extender: [name], Analucia Hush Weeks in Treatment: 6 Verbal / Phone Orders: No Diagnosis Coding ICD-10  Coding Code Description L89.154 Pressure ulcer of sacral region, stage 4 L02.31 Cutaneous abscess of buttock Wound Cleansing Wound #2 Midline Sacrum o Clean wound with Normal Saline. o May Shower, gently pat wound dry prior to applying new dressing. Anesthetic (add to Medication List) Wound #2 Midline Sacrum o Topical Lidocaine 4% cream applied to wound bed prior to debridement (In Clinic Only). Primary Wound Dressing Wound #2 Midline Sacrum o Silver Alginate Secondary Dressing Wound #2 Midline Sacrum o ABD pad Dressing Change Frequency Wound #2 Midline Sacrum o Change dressing every other day. Follow-up Appointments Wound #2 Midline Sacrum o Return Appointment in 1 week. Off-Loading Wound #2 Midline Sacrum o Turn and reposition every 2 hours Home Health Wound #2 [facility] - Dressing change in office on Mondays o [place] Nurse may visit PRN to address patientos wound care needs. o FACE TO FACE ENCOUNTER: MEDICARE and MEDICAID PATIENTS: I certify that this patient is under my care and that I had a face-to-face encounter that meets the physician face-to-face encounter requirements with this [name], [name] ([id number]) patient on this date. The encounter with the patient was in whole or in part for the following MEDICAL CONDITION: (primary reason for [place]) MEDICAL NECESSITY: I certify, that based on my findings, NURSING services are a medically necessary home health service. HOME BOUND STATUS: I certify that my clinical findings support that this patient is homebound (i.e., Due to illness or injury, pt requires aid of supportive devices such as crutches, cane, wheelchairs, walkers, the use of special transportation or the assistance of another person to leave their place of residence. There is a normal inability to leave the home and doing so requires  considerable and taxing effort. Other absences are for medical reasons / religious services and are infrequent or of short duration when for other reasons). o If current dressing causes regression in wound condition, may D/C ordered dressing product/s and apply Normal Saline Moist Dressing daily until next Wound Healing  Center / Other MD appointment. [place] of regression in wound condition at [phone]. o Please direct any NON-WOUND related issues/requests for orders to patient's Primary Care Physician Negative Pressure Wound Therapy Wound #2 Midline Sacrum o Wound VAC settings at 125/130 mmHg continuous pressure. Use BLACK/GREEN foam to wound cavity. Use WHITE foam to fill any tunnel/s and/or undermining. Change VAC dressing 2 X WEEK. Change canister as indicated when full. Nurse may titrate settings and frequency of dressing changes as clinically indicated. o Wound VAC settings at 125/130 mmHg continuous pressure. Use BLACK/GREEN foam to wound cavity. Use WHITE foam to fill any tunnel/s and/or undermining. Change VAC dressing 3 X WEEK. Change canister as indicated when full. Nurse may titrate settings and frequency of dressing changes as clinically indicated. - Silver foam to be used in wound bed. Place Mepitel to base of tunnel to cover bone. o Home Health Nurse may d/c VAC for s/s of increased infection, significant wound regression, or uncontrolled drainage. [place] at [phone]. o Apply contact layer over base of wound. o Number of foam/gauze pieces used in the dressing = Electronic Signature(s) Signed: 01/08/2019 9:04:30 AM By: Harold Barban Signed: 01/09/2019 11:01:46 PM By: Worthy Keeler PA-C Entered By: Harold Barban on 01/07/2019 14:33:28 [name] ([id number]) -------------------------------------------------------------------------------- Problem List Details Patient Name: VANESSA, ALESI. Date of  Service: 01/07/2019 2:00 PM Medical Record Number: [id number] Patient Account Number: [id number] Date of Birth/Sex: January 11, 1931 (85 y.o. M) Treating RN: Harold Barban Primary Care Provider: Burman Freestone Other Clinician: Referring Provider: Burman Freestone Treating Provider/Extender: [name], Shivam Mestas Weeks in Treatment: 6 Active Problems ICD-10 Evaluated Encounter Code Description Active Date Today Diagnosis L89.154 Pressure ulcer of sacral region, stage 4 11/20/2018 No Yes L02.31 Cutaneous abscess of buttock 11/20/2018 No Yes Inactive Problems Resolved Problems Electronic Signature(s) Signed: 01/09/2019 11:01:46 PM By: Worthy Keeler PA-C Entered By: Worthy Keeler on 01/07/2019 14:05:25 [name] ([id number]) -------------------------------------------------------------------------------- Progress Note Details Patient Name: [name] Date of Service: 01/07/2019 2:00 PM Medical Record Number: [id number] Patient Account Number: [id number] Date of Birth/Sex: 09-Jan-1931 (85 y.o. M) Treating RN: Harold Barban Primary Care Provider: Burman Freestone Other Clinician: Referring Provider: Burman Freestone Treating Provider/Extender: [name], Vyctoria Dickman Weeks in Treatment: 6 Subjective Chief Complaint Information obtained from Patient Sacral pressure ulcer History of Present Illness (HPI) 02/06/18 on evaluation today patient presents for initial evaluation and our clinic concerning an issue which began roughly 3 weeks ago when the patient fell in his home on the floor in his kitchen and laid him down this detergent for roughly 3 days. He had a pressure injury to the left shoulder. This unfortunately has caused him a lot of discomfort although it finally seems to be doing better if anything is really having a lot of itching right now. This appears potentially be a contact dermatitis issue. He also has a significant pressure injury to the sacrum at this time as well which is  also showing fascia exposure right over the bone but no evidence of bone exposure at this point which is good news. They have been using Santyl as well as Saline soaked gauze at this point in time. There does appear to be a lot of necrotic slough in the base of the wound. He does have a history of incontinence, myocardial infarction, and hypertension. He also is "borderline diabetic" hemoglobin A1c of 6.0. Currently he has some discomfort in the pressure site at the sacrum but fortunately nothing too  significant this did require sharp debridement today. 02/13/18 on evaluation today patient appears to be doing much better in regard to his sacral wound. He has been tolerating the dressing changes without complication with the Vashe. Fortunately there is no evidence of infection and though there is some Slough on the surface of the wound bed he has excellent granulation noted. Overall I'm pleased with how things have progressed in that regard. A glance at his shoulder as well and the rash seems to be someone improving in my pinion at this site as well. Overall I am pleased with what we're seeing and so is the family. 02/20/18 on evaluation today patient appears to be doing a little worse in regard to the sacral wound only in the fact that there is redness surrounding it has me somewhat concerned for infection. The drainage has also apparently been a little bit off color compared to normal according to family they did keep the dressing today that was removed to show me and I agree this seems to be a little bit different compared to what we have been seeing. Coupled with the redness I'm concerned he may be developing some cellulitis surrounding the wound bed. 02/27/18 on evaluation today patient presents for follow-up concerning his sacral ulcer. We have received the results back from his wound culture which shows unfortunately that the doxycycline will not be of benefit for him I am going to need to  initiate treatment with something else in order to treat the pseudomonas. Otherwise he does not seem to be having any significant pain although his daughter states there are sometimes when he states having pain. We continue to use the Vashe currently. 03/06/18 on evaluation today patient's sacral wound appears to be doing better in my opinion. He has been tolerating the dressing changes without complication. With that being said the silver nitrate has helped with the prominent area of hyper granulation at the 6 o'clock location we will likely need to repeat this again today. Nonetheless overall I am pleased with how things have improved over the last week. The erythema surrounding the wound seems to be greatly improved. 03/13/18 on evaluation today patient's wound actually does not appear to be terribly infected although she does continue to have erythema surrounding the wound bed especially on the left border. I'm still somewhat concerned about the fact that the oral antibiotics alone may not be completely treating his infection. I previously discussed may need to go for IV antibiotic therapy I'm concerned that may be the case. We will need to make a referral today for infectious disease. 03/20/18 on evaluation today the patient sacral wound actually appears to be doing fairly well in regard to granulation although he continues unfortunately to have it your theme is surrounding the periwound region. There's also some increased swelling at the 6 o'clock location which also has me somewhat concerned. With that being said he does have some discomfort but [name], [name]. ([id number]) nothing too significant at this point. He still has not heard from infectious disease his daughter and wife are both present during the office visit today they're going to check back with this again. We did get the information for them to call them today. 03/27/18 on evaluation today patient is seen concerning his ongoing  sacral ulcer. He has been tolerating the dressing changes without complication. With that being said he does present with evidence of bright green drainage noted on the dressing which again is something that I do often expect  to see with a pseudomonas infection. He continues to have your theme is surrounding the wound bed as well and again I'm not 100% convinced this is just pressure related. I did speak with Colletta Maryland who is the nurse practitioner in [place] with infectious disease. I spoke with her actually yesterday concerning this patient. She is not 100% convinced that this is infected. She question whether the wound may just be colonized with Pseudomonas and not actually causing an active infection. I am really not thinking that the edema is associated with pressure alone and again overall I don't feel that your theme and is consistent with a pressure injury either as he's never had any contusions noted like a deep tissue injury on his heel which was new and I did visualize today this was on the left heel. Nonetheless she wanted to give this a little bit more time and thought it would be appropriate to start the Wound VAC at this point. 04/03/18 on evaluation today patient sacral ulcer actually appears to be doing fairly well at this point. He has been tolerating the dressing changes without complication. With that being said I'm very pleased with the progress that has been made in regard to his sacral wound over the past week I do not see as much in the way of erythema which is great news. Nonetheless he does have a small area of hyper granulation unfortunately. This is at roughly the 7 o'clock location and I think does need to be addressed so that this will heal more appropriately. Nonetheless I think we may be ready to go ahead and initiate therapy with the Wound VAC. 04/10/18 on evaluation today patient appears to be doing excellent in regard to his sacral ulcer. The show signs of  great improvement in overall I'm very pleased with how things look. He has been tolerating the dressing changes without complication. Specifically this is the Wound VAC. He also seems to be doing well with the antibiotic there is decreased your theme and redness surrounding the sacral area at this point in the wound has filled in quite significantly. 04/17/18 on evaluation today patient actually appears to be doing excellent in regard to his sacral ulcer. He's been tolerating the dressing changes without complication specifically the Wound VAC. There really are no major concerns from the patient nor family this point he is having no pain. He does have a little bit of Epiboly on the lateral portions of the wound where he does have a little bit more depth that will need to be addressed today. 04/23/18 on evaluation today patient's wound actually appears to be doing excellent at this point. He has been tolerating the Wound VAC and this appears to be doing well other than the fact that it seems to be breaking seal at the 6 o'clock location. I do believe that adding a duodenum dressing at this location try and help maintain the seal would be appropriate and likely very effective. With that being said he overall seems to be showing signs of good improvement at this point. There does not appear to be any evidence of significant infection which is also excellent news. 04/30/18 on evaluation today patient actually appears to be doing well in regard to his sacral ulcer. He's been tolerating the dressing changes without complication. Fortunately there does not appear to be any evidence of infection. Overall I'm very pleased with the progress that has been made up to this point. He does have some blistering underneath the draping unfortunately  although this is definitely something that has been noted on other patients previously is a fairly common occurrence. Nonetheless the patient seems to be doing fairly well in  general in my pinion based on what I see at this time. I do believe these are fairly superficial and minor. 05/07/18 on evaluation today patient's wound actually appears to be doing excellent at this point in time. He has been tolerating the Wound VAC decently well he states that it is somewhat cumbersome to carry around unfortunately. The only other issue he's been having according to family is that they been having a difficult time keeping the Wound VAC in place and doing what is supposed to do without making. Obviously I do think that this is definitely of concern. Nonetheless I do believe she's made good progress up to this point. I'm very happy in that regard. 05/14/18 on evaluation today patient's wound continues to make good progress at this point. He had a minimal amount of slough noted on the surface which was easily wiped away with saline and gauze and in general I feel like that he is continuing to show excellent progress even with the discontinuation of the Wound VAC. Overall I'm pleased in this regard. He was having issues with the Wound VAC in getting it to seal I think that using the Prisma at this time has been equally efficient and getting the wound to diminish in size. 05/29/18- He is here in follow up evaluation for a sacral ulcer. There is improvement, we will continue with prisma and he will follow up in two weeks [name], [name] ([id number]) 06/11/18 on evaluation today patient had unfortunately bright green drainage on the dressing upon evaluation today. This is something that we have encountered before although we were able to get things under control previously with antibiotics. With that being said currently upon further inspection of the three areas of hyper granulation that were separate from the actual wound itself which was almost healed it really appears that these all have some depth to them. They are more tunnels that really have not closed or at least have reopened  as a result likely of infection in my pinion. This is definitely not what I was expecting or hoping for. 06/26/18 on evaluation today patient continues to experience issues with what appears to be small abscesses in the sacral region unfortunately. With that being said he has been tolerating the dressing changes without complication which is good news. He's not having any significant discomfort which is also good news. With that being said his daughter states that after he left last week that the packing that we have placed fell out quite rapidly and he subsequently healed over very quick to the point they were not able to even repack the regions. Nonetheless there appear to be several fluctuance areas noted at this point there's one central region that does seem to be draining still discharge that is somewhat green in color. I did review the results of the wound culture which did show evidence of infection with both MRSA as well as pseudomonas based on that result. Nonetheless again with his other current medications we are not able to do the Cipro due to issues with potential long QT syndrome. I am going to give him a prescription for doxycycline in order to help with the potential MRSA infection. 07/09/18 on evaluation today patient actually appears to be doing about the same in regard to his sacral wound. He actually has his MRI scheduled  for tomorrow and then subsequently is going to be having his infectious disease appointment for Thursday of this week. Fortunately he's not having any significant discomfort he still has your theme in the sacral region he still has several blister/flux went areas although they technically are not blisters this is more like a underlying abscess. The one area that is open still does probe down to bone. Again I am concerned about a deeper infection possibly even sacral osteomyelitis. 07/23/18 on evaluation today patient actually appears to be doing very well all  things considered in regard to his sacral ulcer. Since I've last seen him he actually did have his MRI performed which showed that he has a complex fluid collection superficial to the distal sacrum measuring 5.9 x 4.4 x 2.8 cm which abuts the posterior aspect of the distal sacrum and the sacrum itself shows cortical destruction consistent with osteomyelitis. She has also been seen by infectious disease and currently orders have been initiated for IV antibiotic therapy for the next eight weeks. He was seen by Janene Madeira NP and placed on Ceftazidime and Daptomycin. Currently he has not really been on this quite long enough to see a sufficient response to the new orders as far as antibiotic therapy is concerned. Nonetheless it does appear that he is likely on the right track at this point which is great news. Nonetheless the question which both Colletta Maryland and myself have discussed both with the patient and between ourselves is whether or not the patient may need to be seen by surgeon for surgical evacuation of the fluid collection/abscess and possible debridement of the sacrum itself. Nonetheless at this time my personal opinion is probably gonna be that we wait and get this at least a couple weeks to see the response that he receives with the IV antibiotic therapy. 08/06/18 on evaluation today patient actually appears to be doing rather well in regard to his sacral ulcer region all things considered. He has been tolerating the dressing changes without complication. With that being said there is not any obvious opening nor any drainage noted at this point in time upon evaluation. The patient has been tolerating the dressing changes though. He's doing well with the IV antibiotic therapy. 08/20/18 on evaluation today patient appears to be doing wonderful in regard to his sacral region. In fact there appears to be no wound opening or drainage at this time. Overall I'm very pleased with how things have  progressed. The patient likewise as well as his wife and daughter are very happy as well. Readmission: 11/20/18 on evaluation today patient presents for reevaluation our clinic concerning issues with his sacral region. Unfortunately the area in question is the same region which we previously treated what we thought would successfully back in November 2019. At that time the patient underwent IV antibiotic therapy which seemed to do the job very well. Subsequently however in the past couple of weeks he has begun to have drainage and bleeding from the sacral region and is having increased pain yet again. Fortunately there is no signs of systemic infection although it does appear that the complex abscess that was previously noted may not have fully cleared there was a question between myself as well is infectious disease previous whether not he needed to see a surgeon being that things got better the family opted not to see a surgeon at that time. Nonetheless I am concerned that he may the need to see a surgeon in order to have this area  surgically debrided and possibly a bone culture obtained in order to get a better idea of what we're treating and ensure that this is able to completely and fully heal. 11/27/18 on evaluation today patient appears to be doing about the same in regard to his sacral ulcer. He did see Dr. Viviano Simas, [name] ([id number]) yesterday and the plan is to proceed forward with surgery to clean out the region. This seems to be the most appropriate goal of treatment at this point. Dr. Lysle Pearl just needed to speak with Dr. Ellene Route the patient's neurologist in order to get clearance as the patient was supposed to be scheduled for a carpal tunnel and elbow surgery with Dr. Ellene Route. Nonetheless he has apparently given clearance to proceed with this surgery for the sacral region which is deemed more important at this point. Unfortunately the patient had a little bit of increased pain  although he is having redness at this point there does not appear to be any spreading infection which is good news. No fevers, chills, nausea, or vomiting noted at this time. 12/03/18 on evaluation today patient actually appears to be doing about the same in regard to the sacral ulcer. He still waiting to hear back from Dr. Ines Bloomer office in regard to scheduling his surgery. Fortunately there's no signs of systemic infection at this time which is good news. Unfortunately he really does not seem to making any progress the doxycycline does seem to at least be beneficial in helping to hold off the infection from worsening although unfortunately he still has a lot going on in this regard. Patient's daughter states she's actually gonna contact her office tomorrow to see what exactly is going on. 12/10/18 on evaluation today patient actually appears to be doing about the same in regard to her sacral room. Apparently he still waiting on approval from both cardiology as well as Dr. Ellene Route although apparently he's Artie gotten a formal letter from Dr. Ellene Route stated that he was clear to proceed with the sacral surgery. Di Kindle actually found the clearance letter from cardiology in epic as well today and this subsequently was given to the patient's daughter as that seems to be the only thing that was holding up proceeding with the surgery. Hopefully should be able to take that over to the surgeon's office today in order to go ahead and get this scheduled. 12/24/18 on evaluation today patient actually appears to be doing very well in regard to his sacral ulcer compared to when I last saw him. He has had surgery Dr. Lysle Pearl and it does appear that he was able to clear out this area of abscess very well without complication. This did extend down to the bone and a portion of the bone was sent for pathology and culture. Although on the pathology report it appears this is more tissue and not bone noted. With that being said  the culture from the bone and Henrene Pastor asked him this was obtained revealed Corynebacterium striatum with no anaerobes isolated. Dr. Lysle Pearl did not place the patient on the antibiotics at this point. Again I have been debating on whether or not this is the patient to infectious disease I think that I am going to do that at this point to gain their opinion on whether or not there's anything we should be treated in this regard and if so will be the best treatment options. 12/31/18 on evaluation today patient actually appears to be doing okay in regard to his sacral wound. Fortunately there  does not appear to be any evidence of infection at this time which is good news. He has been tolerating the dressing's without complication. With that being said he did see infectious disease and apparently they realize that the pathology samples sent from the operating room was actually. I'll see him and not bone and the question is whether the culture and sample or just contamination from the skin of not guilty and active infection. The daughter is very concerned about this she states that we may need to get a piece of bone to send for examination to ensure there's nothing more severe going on here. Obviously with this history of healing and then obsesses forming and then having to go for surgery now and reopening everything they have a reason to be concerned I completely agree. 01/07/19 on evaluation today patient actually appears to be doing well in regard to his sacral wound. Again we did get results back of his bone culture as well as the pathology which showed no evidence of osteomyelitis at this point this is excellent news. Overall I think that he likely does not need to go forward with the MRI since everything seems to be showing negative at this time. Especially in light of the fact that the wound appears to be doing well and that the infection and everything was running the wound bed seems to be dramatically  improved. Patient's daughter is in agreement with this plan. Patient History Information obtained from Patient. Family History Cancer - Paternal Grandparents, Diabetes - Father, Heart Disease - Mother,Father, Stroke - Father, No family history of Hypertension, Kidney Disease, Lung Disease, Seizures, Thyroid Problems, Tuberculosis. Social History Never smoker, Marital Status - Widowed, Alcohol Use - Never, Drug Use - No History, Caffeine Use - Daily. Medical History Eyes Patient has history of Cataracts - bilateral removal Denies history of Glaucoma, Optic Neuritis Ear/Nose/Mouth/Throat [name], [name] ([id number]) Denies history of Chronic sinus problems/congestion, Middle ear problems Hematologic/Lymphatic Denies history of Anemia, Hemophilia, Human Immunodeficiency Virus, Lymphedema, Sickle Cell Disease Respiratory Patient has history of Asthma Denies history of Aspiration, Chronic Obstructive Pulmonary Disease (COPD), Pneumothorax, Sleep Apnea, Tuberculosis Cardiovascular Patient has history of Angina, Arrhythmia, Coronary Artery Disease, Hypertension, Myocardial Infarction - 2001 Denies history of Congestive Heart Failure, Deep Vein Thrombosis, Hypotension, Peripheral Arterial Disease, Peripheral Venous Disease, Phlebitis, Vasculitis Gastrointestinal Denies history of Cirrhosis , Colitis, Crohn s, Hepatitis A, Hepatitis B, Hepatitis C Endocrine Denies history of Type I Diabetes, Type II Diabetes Genitourinary Denies history of End Stage Renal Disease Immunological Denies history of Lupus Erythematosus, Raynaud s, Scleroderma Integumentary (Skin) Denies history of History of Burn, History of pressure wounds Musculoskeletal Patient has history of Osteoarthritis Denies history of Gout, Rheumatoid Arthritis, Osteomyelitis Neurologic Patient has history of Dementia, Neuropathy Denies history of Quadriplegia, Paraplegia, Seizure Disorder Oncologic Denies history of Received  Chemotherapy, Received Radiation Psychiatric Denies history of Anorexia/bulimia, Confinement Anxiety Hospitalization/Surgery History - 01/20/2018, [facility], Fall. 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 Respiratory The patient has no complaints or symptoms. Cardiovascular The patient has no complaints or symptoms. Psychiatric The patient has no complaints or symptoms. Objective [name], [name] ([id number]) Constitutional Well-nourished and well-hydrated in no acute distress. Vitals Time Taken: 1:56 PM, Height: 69 in, Weight: 170.6 lbs, BMI: 25.2, Temperature: 97.7 F, Pulse: 68 bpm, Respiratory Rate: 16 breaths/min, Blood Pressure: 136/71 mmHg. Respiratory normal breathing without difficulty. clear to auscultation  bilaterally. Cardiovascular regular rate and rhythm with normal S1, S2. Psychiatric this patient is able to make decisions and demonstrates good insight into disease process. Alert and Oriented x 3. pleasant and cooperative. General Notes: Patient's wound bed currently did have some Slough noted although I was able to clean this off with saline and gauze without complication today. Post debridement the wound bed appears to be doing much better which is excellent news. Integumentary (Hair, Skin) Wound #2 status is Open. Original cause of wound was Bump. The wound is located on the Midline Sacrum. The wound measures 2.3cm length x 1.7cm width x 2.2cm depth; 3.071cm^2 area and 6.756cm^3 volume. There is bone and Fat Layer (Subcutaneous Tissue) Exposed exposed. There is no tunneling or undermining noted. There is a medium amount of purulent drainage noted. The wound margin is indistinct and nonvisible. There is small (1-33%) pale, hyper - granulation within the wound bed. There is a medium (34-66%) amount of necrotic  tissue within the wound bed including Adherent Slough. The periwound skin appearance exhibited: Erythema. The periwound skin appearance did not exhibit: Callus, Crepitus, Excoriation, Induration, Rash, Scarring, Dry/Scaly, Maceration, Atrophie Blanche, Cyanosis, Ecchymosis, Hemosiderin Staining, Mottled, Pallor, Rubor. The surrounding wound skin color is noted with erythema which is circumferential. Periwound temperature was noted as No Abnormality. The periwound has tenderness on palpation. Assessment Active Problems ICD-10 Pressure ulcer of sacral region, stage 4 Cutaneous abscess of buttock Plan Wound Cleansing: Wound #2 Midline Sacrum: Clean wound with Normal Saline. May Shower, gently pat wound dry prior to applying new dressing. Anesthetic (add to Medication List): Wound #2 Midline Sacrum: KIMANI, HOVIS ([id number]) Topical Lidocaine 4% cream applied to wound bed prior to debridement (In Clinic Only). Primary Wound Dressing: Wound #2 Midline Sacrum: Silver Alginate Secondary Dressing: Wound #2 Midline Sacrum: ABD pad Dressing Change Frequency: Wound #2 Midline Sacrum: Change dressing every other day. Follow-up Appointments: Wound #2 Midline Sacrum: Return Appointment in 1 week. Off-Loading: Wound #2 Midline Sacrum: Turn and reposition every 2 hours Home Health: Wound #2 Midline Sacrum: [facility] - Dressing change in office on Mondays [place] Nurse may visit PRN to address patient s wound care needs. FACE TO FACE ENCOUNTER: MEDICARE and MEDICAID PATIENTS: I certify that this patient is under my care and that I had a face-to-face encounter that meets the physician face-to-face encounter requirements with this patient on this date. The encounter with the patient was in whole or in part for the following MEDICAL CONDITION: (primary reason for [place]) MEDICAL NECESSITY: I certify, that based on my  findings, NURSING services are a medically necessary home health service. HOME BOUND STATUS: I certify that my clinical findings support that this patient is homebound (i.e., Due to illness or injury, pt requires aid of supportive devices such as crutches, cane, wheelchairs, walkers, the use of special transportation or the assistance of another person to leave their place of residence. There is a normal inability to leave the home and doing so requires considerable and taxing effort. Other absences are for medical reasons / religious services and are infrequent or of short duration when for other reasons). If current dressing causes regression in wound condition, may D/C ordered dressing product/s and apply Normal Saline Moist Dressing daily until next [place] / Other MD appointment. [place] of regression in wound condition at [phone]. Please direct any NON-WOUND related issues/requests for orders to patient's Primary Care Physician Negative Pressure  Wound Therapy: Wound #2 Midline Sacrum: Wound VAC settings at 125/130 mmHg continuous pressure. Use BLACK/GREEN foam to wound cavity. Use WHITE foam to fill any tunnel/s and/or undermining. Change VAC dressing 2 X WEEK. Change canister as indicated when full. Nurse may titrate settings and frequency of dressing changes as clinically indicated. Wound VAC settings at 125/130 mmHg continuous pressure. Use BLACK/GREEN foam to wound cavity. Use WHITE foam to fill any tunnel/s and/or undermining. Change VAC dressing 3 X WEEK. Change canister as indicated when full. Nurse may titrate settings and frequency of dressing changes as clinically indicated. - Silver foam to be used in wound bed. Place Mepitel to base of tunnel to cover bone. Home Health Nurse may d/c VAC for s/s of increased infection, significant wound regression, or uncontrolled drainage. [place] at [phone]. Apply contact layer  over base of wound. Number of foam/gauze pieces used in the dressing = My suggestion currently is gonna be that we go ahead and continue with the above wound care measures for the next week he's in agreement this plan. We will see were things that at follow-up. If anything changes or worsens in the meantime he will contact the office and let me know. Please see above for specific wound care orders. We will see patient for re-evaluation in 1 week(s) here in the clinic. If anything worsens or changes patient will contact our office for additional recommendations. [name], [name] ([id number]) Electronic Signature(s) Signed: 01/09/2019 11:01:46 PM By: Worthy Keeler PA-C Entered By: Worthy Keeler on 01/07/2019 23:35:41 Geovannie, Vilar [name] ([id number]) -------------------------------------------------------------------------------- ROS/PFSH Details Patient Name: [name] Date of Service: 01/07/2019 2:00 PM Medical Record Number: [id number] Patient Account Number: [id number] Date of Birth/Sex: [birth year] (85 y.o. M) Treating RN: Harold Barban Primary Care Provider: Burman Freestone Other Clinician: Referring Provider: Burman Freestone Treating Provider/Extender: [name], Florinda Taflinger Weeks in Treatment: 6 Information Obtained From Patient Wound History Do you currently have one or more open woundso Yes How many open wounds do you currently haveo 1 Approximately how long have you had your woundso 2 or 3 weeks How have you been treating your wound(s) until nowo bordered foam dressing Has your wound(s) ever healed and then re-openedo Yes Have you had any lab work done in the past montho No Have you tested positive for an antibiotic resistant organism (MRSA, VRE)o Yes Have you tested positive for osteomyelitis (bone infection)o No Have you had any tests for circulation on your legso No Constitutional Symptoms (General Health) Complaints and Symptoms: Negative for: Fever;  Chills Eyes Medical History: Positive for: Cataracts - bilateral removal Negative for: Glaucoma; Optic Neuritis Ear/Nose/Mouth/Throat Medical History: Negative for: Chronic sinus problems/congestion; Middle ear problems Hematologic/Lymphatic Medical History: Negative for: Anemia; Hemophilia; Human Immunodeficiency Virus; Lymphedema; Sickle Cell Disease Respiratory Complaints and Symptoms: No Complaints or Symptoms Medical History: Positive for: Asthma Negative for: Aspiration; Chronic Obstructive Pulmonary Disease (COPD); Pneumothorax; Sleep Apnea; Tuberculosis Cardiovascular Complaints and Symptoms: No Complaints or Symptoms Medical HistoryMarland Kitchen [name], [name] ([id number]) Positive for: Angina; Arrhythmia; Coronary Artery Disease; Hypertension; Myocardial Infarction - 2001 Negative for: Congestive Heart Failure; Deep Vein Thrombosis; Hypotension; Peripheral Arterial Disease; Peripheral Venous Disease; Phlebitis; Vasculitis Gastrointestinal Medical History: Negative for: Cirrhosis ; Colitis; Crohnos; Hepatitis A; Hepatitis B; Hepatitis C Endocrine Medical History: Negative for: Type I Diabetes; Type II Diabetes Past Medical History Notes: Borderline Genitourinary Medical History: Negative for: End Stage Renal Disease Immunological Medical History: Negative for: Lupus Erythematosus; Raynaudos; Scleroderma Integumentary (Skin) Medical History: Negative for:  History of Burn; History of pressure wounds Musculoskeletal Medical History: Positive for: Osteoarthritis Negative for: Gout; Rheumatoid Arthritis; Osteomyelitis Neurologic Medical History: Positive for: Dementia; Neuropathy Negative for: Quadriplegia; Paraplegia; Seizure Disorder Oncologic Medical History: Negative for: Received Chemotherapy; Received Radiation Past Medical History Notes: Melanoma on back Psychiatric Complaints and Symptoms: No Complaints or Symptoms Medical History: Negative for:  Anorexia/bulimia; Confinement Anxiety HBO Extended History Items [name], [name] ([id number]) Eyes: Cataracts Immunizations Pneumococcal Vaccine: Received Pneumococcal Vaccination: Yes Implantable Devices No devices added Hospitalization / Surgery History Name of Hospital Purpose of Hospitalization/Surgery Date [facility] Fall 01/20/2018 Family and Social History Cancer: Yes - Paternal Grandparents; Diabetes: Yes - Father; Heart Disease: Yes - Mother,Father; Hypertension: No; Kidney Disease: No; Lung Disease: No; Seizures: No; Stroke: Yes - Father; Thyroid Problems: No; Tuberculosis: No; Never smoker; Marital Status - Widowed; Alcohol Use: Never; Drug Use: No History; Caffeine Use: Daily; Financial Concerns: No; Food, Clothing or Shelter Needs: No; Support System Lacking: No; Transportation Concerns: No; Advanced Directives: Yes (Not Provided); Patient does not want information on Advanced Directives; Do not resuscitate: Yes (Not Provided); Living Will: Yes (Not Provided); Medical Power of Attorney: Yes - Cem Kosman (Not Provided) Physician Affirmation I have reviewed and agree with the above information. Electronic Signature(s) Signed: 01/08/2019 9:04:30 AM By: Harold Barban Signed: 01/09/2019 11:01:46 PM By: Worthy Keeler PA-C Entered By: Worthy Keeler on 01/07/2019 23:35:07 [name] ([id number]) -------------------------------------------------------------------------------- SuperBill Details Patient Name: [name] Date of Service: 01/07/2019 Medical Record Number: [id number] Patient Account Number: [id number] Date of Birth/Sex: 07-Mar-1931 (85 y.o. M) Treating RN: Harold Barban Primary Care Provider: Burman Freestone Other Clinician: Referring Provider: Burman Freestone Treating Provider/Extender: [name], Barrie [name] Weeks in Treatment: 6 Diagnosis Coding ICD-10 Codes Code Description L89.154 Pressure ulcer of sacral region, stage 4 L02.31 Cutaneous abscess of  buttock Facility Procedures CPT4 Code: 58099833 Description: 82505 - WOUND CARE VISIT-LEV 3 EST PT Modifier: Quantity: 1 Physician Procedures CPT4 Code: [id number] Description: 19379 - WC PHYS LEVEL 4 - EST PT ICD-10 Diagnosis Description L89.154 Pressure ulcer of sacral region, stage 4 L02.31 Cutaneous abscess of buttock Modifier: Quantity: 1 Electronic Signature(s) Signed: 01/09/2019 11:01:46 PM By: Worthy Keeler PA-C Entered By: Worthy Keeler on 01/07/2019 23:35:55

## 2019-01-11 ENCOUNTER — Ambulatory Visit: Payer: Medicare Other

## 2019-01-14 ENCOUNTER — Encounter: Payer: Medicare Other | Admitting: Physician Assistant

## 2019-01-14 ENCOUNTER — Other Ambulatory Visit: Payer: Self-pay

## 2019-01-14 DIAGNOSIS — E10622 Type 1 diabetes mellitus with other skin ulcer: Secondary | ICD-10-CM | POA: Diagnosis not present

## 2019-01-15 NOTE — Progress Notes (Addendum)
ERYX, ZANE ([id number]) Visit Report for 01/14/2019 Chief Complaint Document Details Patient Name: [name], [name]. Date of Service: 01/14/2019 12:45 PM Medical Record Number: [id number] Patient Account Number: [id number] Date of Birth/Sex: 1931-07-04 (85 y.o. M) Treating RN: Harold Barban Primary Care Provider: Burman [name] Other Clinician: Referring Provider: Burman [name] Treating Provider/Extender: [name], HOYT Weeks in Treatment: 7 Information Obtained from: Patient Chief Complaint Sacral pressure ulcer Electronic Signature(s) Signed: 01/15/2019 8:30:49 AM By: Worthy Keeler PA-C Entered By: Worthy Keeler on 01/14/2019 12:55:30 [name] ([id number]) -------------------------------------------------------------------------------- HPI Details Patient Name: [name] Date of Service: 01/14/2019 12:45 PM Medical Record Number: [id number] Patient Account Number: [id number] Date of Birth/Sex: 04-May-1931 (85 y.o. M) Treating RN: Harold Barban Primary Care Provider: Burman [name] Other Clinician: Referring Provider: Burman [name] Treating Provider/Extender: [name], HOYT Weeks in Treatment: 7 History of Present Illness HPI Description: 02/06/18 on evaluation today patient presents for initial evaluation and our clinic concerning an issue which began roughly 3 weeks ago when the patient fell in his home on the floor in his kitchen and laid him down this detergent for roughly 3 days. He had a pressure injury to the left shoulder. This unfortunately has caused him a lot of discomfort although it finally seems to be doing better if anything is really having a lot of itching right now. This appears potentially be a contact dermatitis issue. He also has a significant pressure injury to the sacrum at this time as well which is also showing fascia exposure right over the bone but no evidence of bone exposure at this point which is good news. They have  been using Santyl as well as Saline soaked gauze at this point in time. There does appear to be a lot of necrotic slough in the base of the wound. He does have a history of incontinence, myocardial infarction, and hypertension. He also is "borderline diabetic" hemoglobin A1c of 6.0. Currently he has some discomfort in the pressure site at the sacrum but fortunately nothing too significant this did require sharp debridement today. 02/13/18 on evaluation today patient appears to be doing much better in regard to his sacral wound. He has been tolerating the dressing changes without complication with the Vashe. Fortunately there is no evidence of infection and though there is some Slough on the surface of the wound bed he has excellent granulation noted. Overall I'm pleased with how things have progressed in that regard. A glance at his shoulder as well and the rash seems to be someone improving in my pinion at this site as well. Overall I am pleased with what we're seeing and so is the family. 02/20/18 on evaluation today patient appears to be doing a little worse in regard to the sacral wound only in the fact that there is redness surrounding it has me somewhat concerned for infection. The drainage has also apparently been a little bit off color compared to normal according to family they did keep the dressing today that was removed to show me and I agree this seems to be a little bit different compared to what we have been seeing. Coupled with the redness I'm concerned he may be developing some cellulitis surrounding the wound bed. 02/27/18 on evaluation today patient presents for follow-up concerning his sacral ulcer. We have received the results back from his wound culture which shows unfortunately that the doxycycline will not be of benefit for him I am going to need to initiate treatment with something else  in order to treat the pseudomonas. Otherwise he does not seem to be having any  significant pain although his daughter states there are sometimes when he states having pain. We continue to use the Vashe currently. 03/06/18 on evaluation today patient's sacral wound appears to be doing better in my opinion. He has been tolerating the dressing changes without complication. With that being said the silver nitrate has helped with the prominent area of hyper granulation at the 6 o'clock location we will likely need to repeat this again today. Nonetheless overall I am pleased with how things have improved over the last week. The erythema surrounding the wound seems to be greatly improved. 03/13/18 on evaluation today patient's wound actually does not appear to be terribly infected although she does continue to have erythema surrounding the wound bed especially on the left border. I'm still somewhat concerned about the fact that the oral antibiotics alone may not be completely treating his infection. I previously discussed may need to go for IV antibiotic therapy I'm concerned that may be the case. We will need to make a referral today for infectious disease. 03/20/18 on evaluation today the patient sacral wound actually appears to be doing fairly well in regard to granulation although he continues unfortunately to have it your theme is surrounding the periwound region. There's also some increased swelling at the 6 o'clock location which also has me somewhat concerned. With that being said he does have some discomfort but nothing too significant at this point. He still has not heard from infectious disease his daughter and wife are both present during the office visit today they're going to check back with this again. We did get the information for them to call them today. 03/27/18 on evaluation today patient is seen concerning his ongoing sacral ulcer. He has been tolerating the dressing changes without complication. With that being said he does present with evidence of bright green  drainage noted on the dressing which again is something that I do often expect to see with a pseudomonas infection. He continues to have your theme is [name], [name] ([id number]) surrounding the wound bed as well and again I'm not 100% convinced this is just pressure related. I did speak with Colletta Maryland who is the nurse practitioner in [place] with infectious disease. I spoke with her actually yesterday concerning this patient. She is not 100% convinced that this is infected. She question whether the wound may just be colonized with Pseudomonas and not actually causing an active infection. I am really not thinking that the edema is associated with pressure alone and again overall I don't feel that your theme and is consistent with a pressure injury either as he's never had any contusions noted like a deep tissue injury on his heel which was new and I did visualize today this was on the left heel. Nonetheless she wanted to give this a little bit more time and thought it would be appropriate to start the Wound VAC at this point. 04/03/18 on evaluation today patient sacral ulcer actually appears to be doing fairly well at this point. He has been tolerating the dressing changes without complication. With that being said I'm very pleased with the progress that has been made in regard to his sacral wound over the past week I do not see as much in the way of erythema which is great news. Nonetheless he does have a small area of hyper granulation unfortunately. This is at roughly the 7 o'clock location and I  think does need to be addressed so that this will heal more appropriately. Nonetheless I think we may be ready to go ahead and initiate therapy with the Wound VAC. 04/10/18 on evaluation today patient appears to be doing excellent in regard to his sacral ulcer. The show signs of great improvement in overall I'm very pleased with how things look. He has been tolerating the dressing changes  without complication. Specifically this is the Wound VAC. He also seems to be doing well with the antibiotic there is decreased your theme and redness surrounding the sacral area at this point in the wound has filled in quite significantly. 04/17/18 on evaluation today patient actually appears to be doing excellent in regard to his sacral ulcer. He's been tolerating the dressing changes without complication specifically the Wound VAC. There really are no major concerns from the patient nor family this point he is having no pain. He does have a little bit of Epiboly on the lateral portions of the wound where he does have a little bit more depth that will need to be addressed today. 04/23/18 on evaluation today patient's wound actually appears to be doing excellent at this point. He has been tolerating the Wound VAC and this appears to be doing well other than the fact that it seems to be breaking seal at the 6 o'clock location. I do believe that adding a duodenum dressing at this location try and help maintain the seal would be appropriate and likely very effective. With that being said he overall seems to be showing signs of good improvement at this point. There does not appear to be any evidence of significant infection which is also excellent news. 04/30/18 on evaluation today patient actually appears to be doing well in regard to his sacral ulcer. He's been tolerating the dressing changes without complication. Fortunately there does not appear to be any evidence of infection. Overall I'm very pleased with the progress that has been made up to this point. He does have some blistering underneath the draping unfortunately although this is definitely something that has been noted on other patients previously is a fairly common occurrence. Nonetheless the patient seems to be doing fairly well in general in my pinion based on what I see at this time. I do believe these are fairly superficial and  minor. 05/07/18 on evaluation today patient's wound actually appears to be doing excellent at this point in time. He has been tolerating the Wound VAC decently well he states that it is somewhat cumbersome to carry around unfortunately. The only other issue he's been having according to family is that they been having a difficult time keeping the Wound VAC in place and doing what is supposed to do without making. Obviously I do think that this is definitely of concern. Nonetheless I do believe she's made good progress up to this point. I'm very happy in that regard. 05/14/18 on evaluation today patient's wound continues to make good progress at this point. He had a minimal amount of slough noted on the surface which was easily wiped away with saline and gauze and in general I feel like that he is continuing to show excellent progress even with the discontinuation of the Wound VAC. Overall I'm pleased in this regard. He was having issues with the Wound VAC in getting it to seal I think that using the Prisma at this time has been equally efficient and getting the wound to diminish in size. 05/29/18- He is here in follow up  evaluation for a sacral ulcer. There is improvement, we will continue with prisma and he will follow up in two weeks 06/11/18 on evaluation today patient had unfortunately bright green drainage on the dressing upon evaluation today. This is something that we have encountered before although we were able to get things under control previously with antibiotics. With that being said currently upon further inspection of the three areas of hyper granulation that were separate from the actual wound itself which was almost healed it really appears that these all have some depth to them. They are more tunnels that really have not closed or at least have reopened as a result likely of infection in my pinion. This is definitely not what I was expecting or hoping for. [name], [name]  ([id number]) 06/26/18 on evaluation today patient continues to experience issues with what appears to be small abscesses in the sacral region unfortunately. With that being said he has been tolerating the dressing changes without complication which is good news. He's not having any significant discomfort which is also good news. With that being said his daughter states that after he left last week that the packing that we have placed fell out quite rapidly and he subsequently healed over very quick to the point they were not able to even repack the regions. Nonetheless there appear to be several fluctuance areas noted at this point there's one central region that does seem to be draining still discharge that is somewhat green in color. I did review the results of the wound culture which did show evidence of infection with both MRSA as well as pseudomonas based on that result. Nonetheless again with his other current medications we are not able to do the Cipro due to issues with potential long QT syndrome. I am going to give him a prescription for doxycycline in order to help with the potential MRSA infection. 07/09/18 on evaluation today patient actually appears to be doing about the same in regard to his sacral wound. He actually has his MRI scheduled for tomorrow and then subsequently is going to be having his infectious disease appointment for Thursday of this week. Fortunately he's not having any significant discomfort he still has your theme in the sacral region he still has several blister/flux went areas although they technically are not blisters this is more like a underlying abscess. The one area that is open still does probe down to bone. Again I am concerned about a deeper infection possibly even sacral osteomyelitis. 07/23/18 on evaluation today patient actually appears to be doing very well all things considered in regard to his sacral ulcer. Since I've last seen him he actually did have  his MRI performed which showed that he has a complex fluid collection superficial to the distal sacrum measuring 5.9 x 4.4 x 2.8 cm which abuts the posterior aspect of the distal sacrum and the sacrum itself shows cortical destruction consistent with osteomyelitis. She has also been seen by infectious disease and currently orders have been initiated for IV antibiotic therapy for the next eight weeks. He was seen by Janene Madeira NP and placed on Ceftazidime and Daptomycin. Currently he has not really been on this quite long enough to see a sufficient response to the new orders as far as antibiotic therapy is concerned. Nonetheless it does appear that he is likely on the right track at this point which is great news. Nonetheless the question which both Colletta Maryland and myself have discussed both with the patient and between  ourselves is whether or not the patient may need to be seen by surgeon for surgical evacuation of the fluid collection/abscess and possible debridement of the sacrum itself. Nonetheless at this time my personal opinion is probably gonna be that we wait and get this at least a couple weeks to see the response that he receives with the IV antibiotic therapy. 08/06/18 on evaluation today patient actually appears to be doing rather well in regard to his sacral ulcer region all things considered. He has been tolerating the dressing changes without complication. With that being said there is not any obvious opening nor any drainage noted at this point in time upon evaluation. The patient has been tolerating the dressing changes though. He's doing well with the IV antibiotic therapy. 08/20/18 on evaluation today patient appears to be doing wonderful in regard to his sacral region. In fact there appears to be no wound opening or drainage at this time. Overall I'm very pleased with how things have progressed. The patient likewise as well as his wife and daughter are very happy as  well. Readmission: 11/20/18 on evaluation today patient presents for reevaluation our clinic concerning issues with his sacral region. Unfortunately the area in question is the same region which we previously treated what we thought would successfully back in November 2019. At that time the patient underwent IV antibiotic therapy which seemed to do the job very well. Subsequently however in the past couple of weeks he has begun to have drainage and bleeding from the sacral region and is having increased pain yet again. Fortunately there is no signs of systemic infection although it does appear that the complex abscess that was previously noted may not have fully cleared there was a question between myself as well is infectious disease previous whether not he needed to see a surgeon being that things got better the family opted not to see a surgeon at that time. Nonetheless I am concerned that he may the need to see a surgeon in order to have this area surgically debrided and possibly a bone culture obtained in order to get a better idea of what we're treating and ensure that this is able to completely and fully heal. 11/27/18 on evaluation today patient appears to be doing about the same in regard to his sacral ulcer. He did see Dr. Lysle Pearl yesterday and the plan is to proceed forward with surgery to clean out the region. This seems to be the most appropriate goal of treatment at this point. Dr. Lysle Pearl just needed to speak with Dr. Ellene Route the patient's neurologist in order to get clearance as the patient was supposed to be scheduled for a carpal tunnel and elbow surgery with Dr. Ellene Route. Nonetheless he has apparently given clearance to proceed with this surgery for the sacral region which is deemed more important at this point. Unfortunately the patient had a little bit of increased pain although he is having redness at this point there does not appear to be any spreading infection which is good news. No  fevers, chills, nausea, or vomiting noted at this time. [name], [name] ([id number]) 12/03/18 on evaluation today patient actually appears to be doing about the same in regard to the sacral ulcer. He still waiting to hear back from Dr. Ines Bloomer office in regard to scheduling his surgery. Fortunately there's no signs of systemic infection at this time which is good news. Unfortunately he really does not seem to making any progress the doxycycline does seem to at least  be beneficial in helping to hold off the infection from worsening although unfortunately he still has a lot going on in this regard. Patient's daughter states she's actually gonna contact her office tomorrow to see what exactly is going on. 12/10/18 on evaluation today patient actually appears to be doing about the same in regard to her sacral room. Apparently he still waiting on approval from both cardiology as well as Dr. Ellene Route although apparently he's Artie gotten a formal letter from Dr. Ellene Route stated that he was clear to proceed with the sacral surgery. Di Kindle actually found the clearance letter from cardiology in epic as well today and this subsequently was given to the patient's daughter as that seems to be the only thing that was holding up proceeding with the surgery. Hopefully should be able to take that over to the surgeon's office today in order to go ahead and get this scheduled. 12/24/18 on evaluation today patient actually appears to be doing very well in regard to his sacral ulcer compared to when I last saw him. He has had surgery Dr. Lysle Pearl and it does appear that he was able to clear out this area of abscess very well without complication. This did extend down to the bone and a portion of the bone was sent for pathology and culture. Although on the pathology report it appears this is more tissue and not bone noted. With that being said the culture from the bone and Henrene Pastor asked him this was obtained revealed  Corynebacterium striatum with no anaerobes isolated. Dr. Lysle Pearl did not place the patient on the antibiotics at this point. Again I have been debating on whether or not this is the patient to infectious disease I think that I am going to do that at this point to gain their opinion on whether or not there's anything we should be treated in this regard and if so will be the best treatment options. 12/31/18 on evaluation today patient actually appears to be doing okay in regard to his sacral wound. Fortunately there does not appear to be any evidence of infection at this time which is good news. He has been tolerating the dressing's without complication. With that being said he did see infectious disease and apparently they realize that the pathology samples sent from the operating room was actually. I'll see him and not bone and the question is whether the culture and sample or just contamination from the skin of not guilty and active infection. The daughter is very concerned about this she states that we may need to get a piece of bone to send for examination to ensure there's nothing more severe going on here. Obviously with this history of healing and then obsesses forming and then having to go for surgery now and reopening everything they have a reason to be concerned I completely agree. 01/07/19 on evaluation today patient actually appears to be doing well in regard to his sacral wound. Again we did get results back of his bone culture as well as the pathology which showed no evidence of osteomyelitis at this point this is excellent news. Overall I think that he likely does not need to go forward with the MRI since everything seems to be showing negative at this time. Especially in light of the fact that the wound appears to be doing well and that the infection and everything was running the wound bed seems to be dramatically improved. Patient's daughter is in agreement with this plan. 01/14/19 on  evaluation today  patient appears to be doing rather well at this time as far as the overall appearance of the sacral wound is concerned. The depth is slightly improved he has some granulation tissue noted there still is bone in the very bottom of the wound bed although I'm no longer able to palpate this with my finger I can only tell by probing with the sterile cotton swab. Nonetheless I do believe this is shown signs of good improvement in healing as far as what I'm seeing at this point. Electronic Signature(s) Signed: 01/15/2019 8:30:49 AM By: Worthy Keeler PA-C Entered By: Worthy Keeler on 01/15/2019 27:74:12 [name] ([id number]) -------------------------------------------------------------------------------- Physical Exam Details Patient Name: [name], [name] Date of Service: 01/14/2019 12:45 PM Medical Record Number: [id number] Patient Account Number: [id number] Date of Birth/Sex: 08/24/1931 (85 y.o. M) Treating RN: Harold Barban Primary Care Provider: Burman [name] Other Clinician: Referring Provider: Burman [name] Treating Provider/Extender: STONE III, HOYT Weeks in Treatment: 7 Constitutional Well-nourished and well-hydrated in no acute distress. Respiratory normal breathing without difficulty. clear to auscultation bilaterally. Cardiovascular regular rate and rhythm with normal S1, S2. Psychiatric this patient is able to make decisions and demonstrates good insight into disease process. Alert and Oriented x 3. pleasant and cooperative. Notes I did mechanically debride the wound that no surgical/sharp debridement was performed today. He tolerated this with minimal discomfort. Electronic Signature(s) Signed: 01/15/2019 8:30:49 AM By: Worthy Keeler PA-C Entered By: Worthy Keeler on 01/15/2019 08:28:47 [name] ([id number]) -------------------------------------------------------------------------------- Physician Orders Details Patient Name:  [name], [name] Date of Service: 01/14/2019 12:45 PM Medical Record Number: [id number] Patient Account Number: [id number] Date of Birth/Sex: Mar 03, 1931 (85 y.o. M) Treating RN: Harold Barban Primary Care Provider: Burman [name] Other Clinician: Referring Provider: Burman [name] Treating Provider/Extender: [name], HOYT Weeks in Treatment: 7 Verbal / Phone Orders: No Diagnosis Coding ICD-10 Coding Code Description L89.154 Pressure ulcer of sacral region, stage 4 L02.31 Cutaneous abscess of buttock Wound Cleansing Wound #2 Midline Sacrum o Clean wound with Normal Saline. o May Shower, gently pat wound dry prior to applying new dressing. Anesthetic (add to Medication List) Wound #2 Midline Sacrum o Topical Lidocaine 4% cream applied to wound bed prior to debridement (In Clinic Only). Primary Wound Dressing Wound #2 Midline Sacrum o Silver Alginate Secondary Dressing Wound #2 Midline Sacrum o ABD pad Dressing Change Frequency Wound #2 Midline Sacrum o Change dressing every other day. Follow-up Appointments Wound #2 Midline Sacrum o Return Appointment in 1 week. Off-Loading Wound #2 Midline Sacrum o Turn and reposition every 2 hours Electronic Signature(s) Signed: 01/14/2019 3:00:04 PM By: Harold Barban Signed: 01/15/2019 8:30:49 AM By: Worthy Keeler PA-C Entered By: Harold Barban on 01/14/2019 13:41:21 [name], [name] ([id number]) [name], [name] ([id number]) -------------------------------------------------------------------------------- Problem List Details Patient Name: [name], [name] Date of Service: 01/14/2019 12:45 PM Medical Record Number: [id number] Patient Account Number: [id number] Date of Birth/Sex: [birth year] (85 y.o. M) Treating RN: Harold Barban Primary Care Provider: Burman [name] Other Clinician: Referring Provider: Burman [name] Treating Provider/Extender: [name], HOYT Weeks in Treatment: 7 Active  Problems ICD-10 Evaluated Encounter Code Description Active Date Today Diagnosis L89.154 Pressure ulcer of sacral region, stage 4 11/20/2018 No Yes L02.31 Cutaneous abscess of buttock 11/20/2018 No Yes Inactive Problems Resolved Problems Electronic Signature(s) Signed: 01/15/2019 8:30:49 AM By: Worthy Keeler PA-C Entered By: Worthy Keeler on 01/14/2019 12:55:25 [name] ([id number]) -------------------------------------------------------------------------------- Progress Note Details Patient Name: [name] Date of Service: 01/14/2019 12:45  PM Medical Record Number: [id number] Patient Account Number: [id number] Date of Birth/Sex: Jun 14, 1931 (85 y.o. M) Treating RN: Harold Barban Primary Care Provider: Burman [name] Other Clinician: Referring Provider: Burman [name] Treating Provider/Extender: [name], HOYT Weeks in Treatment: 7 Subjective Chief Complaint Information obtained from Patient Sacral pressure ulcer History of Present Illness (HPI) 02/06/18 on evaluation today patient presents for initial evaluation and our clinic concerning an issue which began roughly 3 weeks ago when the patient fell in his home on the floor in his kitchen and laid him down this detergent for roughly 3 days. He had a pressure injury to the left shoulder. This unfortunately has caused him a lot of discomfort although it finally seems to be doing better if anything is really having a lot of itching right now. This appears potentially be a contact dermatitis issue. He also has a significant pressure injury to the sacrum at this time as well which is also showing fascia exposure right over the bone but no evidence of bone exposure at this point which is good news. They have been using Santyl as well as Saline soaked gauze at this point in time. There does appear to be a lot of necrotic slough in the base of the wound. He does have a history of incontinence, myocardial infarction, and  hypertension. He also is "borderline diabetic" hemoglobin A1c of 6.0. Currently he has some discomfort in the pressure site at the sacrum but fortunately nothing too significant this did require sharp debridement today. 02/13/18 on evaluation today patient appears to be doing much better in regard to his sacral wound. He has been tolerating the dressing changes without complication with the Vashe. Fortunately there is no evidence of infection and though there is some Slough on the surface of the wound bed he has excellent granulation noted. Overall I'm pleased with how things have progressed in that regard. A glance at his shoulder as well and the rash seems to be someone improving in my pinion at this site as well. Overall I am pleased with what we're seeing and so is the family. 02/20/18 on evaluation today patient appears to be doing a little worse in regard to the sacral wound only in the fact that there is redness surrounding it has me somewhat concerned for infection. The drainage has also apparently been a little bit off color compared to normal according to family they did keep the dressing today that was removed to show me and I agree this seems to be a little bit different compared to what we have been seeing. Coupled with the redness I'm concerned he may be developing some cellulitis surrounding the wound bed. 02/27/18 on evaluation today patient presents for follow-up concerning his sacral ulcer. We have received the results back from his wound culture which shows unfortunately that the doxycycline will not be of benefit for him I am going to need to initiate treatment with something else in order to treat the pseudomonas. Otherwise he does not seem to be having any significant pain although his daughter states there are sometimes when he states having pain. We continue to use the Vashe currently. 03/06/18 on evaluation today patient's sacral wound appears to be doing better in my opinion. He  has been tolerating the dressing changes without complication. With that being said the silver nitrate has helped with the prominent area of hyper granulation at the 6 o'clock location we will likely need to repeat this again today. Nonetheless overall I am pleased with how  things have improved over the last week. The erythema surrounding the wound seems to be greatly improved. 03/13/18 on evaluation today patient's wound actually does not appear to be terribly infected although she does continue to have erythema surrounding the wound bed especially on the left border. I'm still somewhat concerned about the fact that the oral antibiotics alone may not be completely treating his infection. I previously discussed may need to go for IV antibiotic therapy I'm concerned that may be the case. We will need to make a referral today for infectious disease. 03/20/18 on evaluation today the patient sacral wound actually appears to be doing fairly well in regard to granulation although he continues unfortunately to have it your theme is surrounding the periwound region. There's also some increased swelling at the 6 o'clock location which also has me somewhat concerned. With that being said he does have some discomfort but [name], [name]. ([id number]) nothing too significant at this point. He still has not heard from infectious disease his daughter and wife are both present during the office visit today they're going to check back with this again. We did get the information for them to call them today. 03/27/18 on evaluation today patient is seen concerning his ongoing sacral ulcer. He has been tolerating the dressing changes without complication. With that being said he does present with evidence of bright green drainage noted on the dressing which again is something that I do often expect to see with a pseudomonas infection. He continues to have your theme is surrounding the wound bed as well and again I'm not  100% convinced this is just pressure related. I did speak with Colletta Maryland who is the nurse practitioner in [place] with infectious disease. I spoke with her actually yesterday concerning this patient. She is not 100% convinced that this is infected. She question whether the wound may just be colonized with Pseudomonas and not actually causing an active infection. I am really not thinking that the edema is associated with pressure alone and again overall I don't feel that your theme and is consistent with a pressure injury either as he's never had any contusions noted like a deep tissue injury on his heel which was new and I did visualize today this was on the left heel. Nonetheless she wanted to give this a little bit more time and thought it would be appropriate to start the Wound VAC at this point. 04/03/18 on evaluation today patient sacral ulcer actually appears to be doing fairly well at this point. He has been tolerating the dressing changes without complication. With that being said I'm very pleased with the progress that has been made in regard to his sacral wound over the past week I do not see as much in the way of erythema which is great news. Nonetheless he does have a small area of hyper granulation unfortunately. This is at roughly the 7 o'clock location and I think does need to be addressed so that this will heal more appropriately. Nonetheless I think we may be ready to go ahead and initiate therapy with the Wound VAC. 04/10/18 on evaluation today patient appears to be doing excellent in regard to his sacral ulcer. The show signs of great improvement in overall I'm very pleased with how things look. He has been tolerating the dressing changes without complication. Specifically this is the Wound VAC. He also seems to be doing well with the antibiotic there is decreased your theme and redness surrounding the sacral area at  this point in the wound has filled in quite  significantly. 04/17/18 on evaluation today patient actually appears to be doing excellent in regard to his sacral ulcer. He's been tolerating the dressing changes without complication specifically the Wound VAC. There really are no major concerns from the patient nor family this point he is having no pain. He does have a little bit of Epiboly on the lateral portions of the wound where he does have a little bit more depth that will need to be addressed today. 04/23/18 on evaluation today patient's wound actually appears to be doing excellent at this point. He has been tolerating the Wound VAC and this appears to be doing well other than the fact that it seems to be breaking seal at the 6 o'clock location. I do believe that adding a duodenum dressing at this location try and help maintain the seal would be appropriate and likely very effective. With that being said he overall seems to be showing signs of good improvement at this point. There does not appear to be any evidence of significant infection which is also excellent news. 04/30/18 on evaluation today patient actually appears to be doing well in regard to his sacral ulcer. He's been tolerating the dressing changes without complication. Fortunately there does not appear to be any evidence of infection. Overall I'm very pleased with the progress that has been made up to this point. He does have some blistering underneath the draping unfortunately although this is definitely something that has been noted on other patients previously is a fairly common occurrence. Nonetheless the patient seems to be doing fairly well in general in my pinion based on what I see at this time. I do believe these are fairly superficial and minor. 05/07/18 on evaluation today patient's wound actually appears to be doing excellent at this point in time. He has been tolerating the Wound VAC decently well he states that it is somewhat cumbersome to carry around unfortunately.  The only other issue he's been having according to family is that they been having a difficult time keeping the Wound VAC in place and doing what is supposed to do without making. Obviously I do think that this is definitely of concern. Nonetheless I do believe she's made good progress up to this point. I'm very happy in that regard. 05/14/18 on evaluation today patient's wound continues to make good progress at this point. He had a minimal amount of slough noted on the surface which was easily wiped away with saline and gauze and in general I feel like that he is continuing to show excellent progress even with the discontinuation of the Wound VAC. Overall I'm pleased in this regard. He was having issues with the Wound VAC in getting it to seal I think that using the Prisma at this time has been equally efficient and getting the wound to diminish in size. 05/29/18- He is here in follow up evaluation for a sacral ulcer. There is improvement, we will continue with prisma and he will follow up in two weeks [name], [name] ([id number]) 06/11/18 on evaluation today patient had unfortunately bright green drainage on the dressing upon evaluation today. This is something that we have encountered before although we were able to get things under control previously with antibiotics. With that being said currently upon further inspection of the three areas of hyper granulation that were separate from the actual wound itself which was almost healed it really appears that these all have some depth to  them. They are more tunnels that really have not closed or at least have reopened as a result likely of infection in my pinion. This is definitely not what I was expecting or hoping for. 06/26/18 on evaluation today patient continues to experience issues with what appears to be small abscesses in the sacral region unfortunately. With that being said he has been tolerating the dressing changes without complication  which is good news. He's not having any significant discomfort which is also good news. With that being said his daughter states that after he left last week that the packing that we have placed fell out quite rapidly and he subsequently healed over very quick to the point they were not able to even repack the regions. Nonetheless there appear to be several fluctuance areas noted at this point there's one central region that does seem to be draining still discharge that is somewhat green in color. I did review the results of the wound culture which did show evidence of infection with both MRSA as well as pseudomonas based on that result. Nonetheless again with his other current medications we are not able to do the Cipro due to issues with potential long QT syndrome. I am going to give him a prescription for doxycycline in order to help with the potential MRSA infection. 07/09/18 on evaluation today patient actually appears to be doing about the same in regard to his sacral wound. He actually has his MRI scheduled for tomorrow and then subsequently is going to be having his infectious disease appointment for Thursday of this week. Fortunately he's not having any significant discomfort he still has your theme in the sacral region he still has several blister/flux went areas although they technically are not blisters this is more like a underlying abscess. The one area that is open still does probe down to bone. Again I am concerned about a deeper infection possibly even sacral osteomyelitis. 07/23/18 on evaluation today patient actually appears to be doing very well all things considered in regard to his sacral ulcer. Since I've last seen him he actually did have his MRI performed which showed that he has a complex fluid collection superficial to the distal sacrum measuring 5.9 x 4.4 x 2.8 cm which abuts the posterior aspect of the distal sacrum and the sacrum itself shows cortical destruction  consistent with osteomyelitis. She has also been seen by infectious disease and currently orders have been initiated for IV antibiotic therapy for the next eight weeks. He was seen by Janene Madeira NP and placed on Ceftazidime and Daptomycin. Currently he has not really been on this quite long enough to see a sufficient response to the new orders as far as antibiotic therapy is concerned. Nonetheless it does appear that he is likely on the right track at this point which is great news. Nonetheless the question which both Colletta Maryland and myself have discussed both with the patient and between ourselves is whether or not the patient may need to be seen by surgeon for surgical evacuation of the fluid collection/abscess and possible debridement of the sacrum itself. Nonetheless at this time my personal opinion is probably gonna be that we wait and get this at least a couple weeks to see the response that he receives with the IV antibiotic therapy. 08/06/18 on evaluation today patient actually appears to be doing rather well in regard to his sacral ulcer region all things considered. He has been tolerating the dressing changes without complication. With that being said there  is not any obvious opening nor any drainage noted at this point in time upon evaluation. The patient has been tolerating the dressing changes though. He's doing well with the IV antibiotic therapy. 08/20/18 on evaluation today patient appears to be doing wonderful in regard to his sacral region. In fact there appears to be no wound opening or drainage at this time. Overall I'm very pleased with how things have progressed. The patient likewise as well as his wife and daughter are very happy as well. Readmission: 11/20/18 on evaluation today patient presents for reevaluation our clinic concerning issues with his sacral region. Unfortunately the area in question is the same region which we previously treated what we thought would  successfully back in November 2019. At that time the patient underwent IV antibiotic therapy which seemed to do the job very well. Subsequently however in the past couple of weeks he has begun to have drainage and bleeding from the sacral region and is having increased pain yet again. Fortunately there is no signs of systemic infection although it does appear that the complex abscess that was previously noted may not have fully cleared there was a question between myself as well is infectious disease previous whether not he needed to see a surgeon being that things got better the family opted not to see a surgeon at that time. Nonetheless I am concerned that he may the need to see a surgeon in order to have this area surgically debrided and possibly a bone culture obtained in order to get a better idea of what we're treating and ensure that this is able to completely and fully heal. 11/27/18 on evaluation today patient appears to be doing about the same in regard to his sacral ulcer. He did see Dr. Viviano Simas, [name] ([id number]) yesterday and the plan is to proceed forward with surgery to clean out the region. This seems to be the most appropriate goal of treatment at this point. Dr. Lysle Pearl just needed to speak with Dr. Ellene Route the patient's neurologist in order to get clearance as the patient was supposed to be scheduled for a carpal tunnel and elbow surgery with Dr. Ellene Route. Nonetheless he has apparently given clearance to proceed with this surgery for the sacral region which is deemed more important at this point. Unfortunately the patient had a little bit of increased pain although he is having redness at this point there does not appear to be any spreading infection which is good news. No fevers, chills, nausea, or vomiting noted at this time. 12/03/18 on evaluation today patient actually appears to be doing about the same in regard to the sacral ulcer. He still waiting to hear back from Dr.  Ines Bloomer office in regard to scheduling his surgery. Fortunately there's no signs of systemic infection at this time which is good news. Unfortunately he really does not seem to making any progress the doxycycline does seem to at least be beneficial in helping to hold off the infection from worsening although unfortunately he still has a lot going on in this regard. Patient's daughter states she's actually gonna contact her office tomorrow to see what exactly is going on. 12/10/18 on evaluation today patient actually appears to be doing about the same in regard to her sacral room. Apparently he still waiting on approval from both cardiology as well as Dr. Ellene Route although apparently he's Artie gotten a formal letter from Dr. Ellene Route stated that he was clear to proceed with the sacral surgery. Di Kindle actually found  the clearance letter from cardiology in epic as well today and this subsequently was given to the patient's daughter as that seems to be the only thing that was holding up proceeding with the surgery. Hopefully should be able to take that over to the surgeon's office today in order to go ahead and get this scheduled. 12/24/18 on evaluation today patient actually appears to be doing very well in regard to his sacral ulcer compared to when I last saw him. He has had surgery Dr. Lysle Pearl and it does appear that he was able to clear out this area of abscess very well without complication. This did extend down to the bone and a portion of the bone was sent for pathology and culture. Although on the pathology report it appears this is more tissue and not bone noted. With that being said the culture from the bone and Henrene Pastor asked him this was obtained revealed Corynebacterium striatum with no anaerobes isolated. Dr. Lysle Pearl did not place the patient on the antibiotics at this point. Again I have been debating on whether or not this is the patient to infectious disease I think that I am going to do that at  this point to gain their opinion on whether or not there's anything we should be treated in this regard and if so will be the best treatment options. 12/31/18 on evaluation today patient actually appears to be doing okay in regard to his sacral wound. Fortunately there does not appear to be any evidence of infection at this time which is good news. He has been tolerating the dressing's without complication. With that being said he did see infectious disease and apparently they realize that the pathology samples sent from the operating room was actually. I'll see him and not bone and the question is whether the culture and sample or just contamination from the skin of not guilty and active infection. The daughter is very concerned about this she states that we may need to get a piece of bone to send for examination to ensure there's nothing more severe going on here. Obviously with this history of healing and then obsesses forming and then having to go for surgery now and reopening everything they have a reason to be concerned I completely agree. 01/07/19 on evaluation today patient actually appears to be doing well in regard to his sacral wound. Again we did get results back of his bone culture as well as the pathology which showed no evidence of osteomyelitis at this point this is excellent news. Overall I think that he likely does not need to go forward with the MRI since everything seems to be showing negative at this time. Especially in light of the fact that the wound appears to be doing well and that the infection and everything was running the wound bed seems to be dramatically improved. Patient's daughter is in agreement with this plan. 01/14/19 on evaluation today patient appears to be doing rather well at this time as far as the overall appearance of the sacral wound is concerned. The depth is slightly improved he has some granulation tissue noted there still is bone in the very bottom of the  wound bed although I'm no longer able to palpate this with my finger I can only tell by probing with the sterile cotton swab. Nonetheless I do believe this is shown signs of good improvement in healing as far as what I'm seeing at this point. Patient History Information obtained from Patient. Family History Cancer -  Paternal Grandparents, Diabetes - Father, Heart Disease - [place], Stroke - Father, No family history of Hypertension, Kidney Disease, Lung Disease, Seizures, Thyroid Problems, Tuberculosis. Social History Never smoker, Marital Status - Widowed, Alcohol Use - Never, Drug Use - No History, Caffeine Use - Daily. [name], [name] ([id number]) Medical History Eyes Patient has history of Cataracts - bilateral removal Denies history of Glaucoma, Optic Neuritis Ear/Nose/Mouth/Throat Denies history of Chronic sinus problems/congestion, Middle ear problems Hematologic/Lymphatic Denies history of Anemia, Hemophilia, Human Immunodeficiency Virus, Lymphedema, Sickle Cell Disease Respiratory Patient has history of Asthma Denies history of Aspiration, Chronic Obstructive Pulmonary Disease (COPD), Pneumothorax, Sleep Apnea, Tuberculosis Cardiovascular Patient has history of Angina, Arrhythmia, Coronary Artery Disease, Hypertension, Myocardial Infarction - 2001 Denies history of Congestive Heart Failure, Deep Vein Thrombosis, Hypotension, Peripheral Arterial Disease, Peripheral Venous Disease, Phlebitis, Vasculitis Gastrointestinal Denies history of Cirrhosis , Colitis, Crohn s, Hepatitis A, Hepatitis B, Hepatitis C Endocrine Denies history of Type I Diabetes, Type II Diabetes Genitourinary Denies history of End Stage Renal Disease Immunological Denies history of Lupus Erythematosus, Raynaud s, Scleroderma Integumentary (Skin) Denies history of History of Burn, History of pressure wounds Musculoskeletal Patient has history of Osteoarthritis Denies history of Gout, Rheumatoid  Arthritis, Osteomyelitis Neurologic Patient has history of Dementia, Neuropathy Denies history of Quadriplegia, Paraplegia, Seizure Disorder Oncologic Denies history of Received Chemotherapy, Received Radiation Psychiatric Denies history of Anorexia/bulimia, Confinement Anxiety Hospitalization/Surgery History - 01/20/2018, [facility], Fall. 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 Respiratory The patient has no complaints or symptoms. Cardiovascular The patient has no complaints or symptoms. Psychiatric The patient has no complaints or symptoms. GLENNIE, RODDA ([id number]) Objective Constitutional Well-nourished and well-hydrated in no acute distress. Vitals Time Taken: 12:46 PM, Height: 69 in, Weight: 170.6 lbs, BMI: 25.2, Temperature: 97.9 F, Pulse: 70 bpm, Respiratory Rate: 16 breaths/min, Blood Pressure: 136/59 mmHg. Respiratory normal breathing without difficulty. clear to auscultation bilaterally. Cardiovascular regular rate and rhythm with normal S1, S2. Psychiatric this patient is able to make decisions and demonstrates good insight into disease process. Alert and Oriented x 3. pleasant and cooperative. General Notes: I did mechanically debride the wound that no surgical/sharp debridement was performed today. He tolerated this with minimal discomfort. Integumentary (Hair, Skin) Wound #2 status is Open. Original cause of wound was Gradually Appeared. The wound is located on the Midline Sacrum. The wound measures 2.7cm length x 1.7cm width x 1.8cm depth; 3.605cm^2 area and 6.489cm^3 volume. There is bone and Fat Layer (Subcutaneous Tissue) Exposed exposed. There is no tunneling or undermining noted. There is a medium amount of purulent drainage noted. The wound margin is indistinct and nonvisible. There is small (1-33%)  pale, hyper - granulation within the wound bed. There is a medium (34-66%) amount of necrotic tissue within the wound bed including Adherent Slough. The periwound skin appearance exhibited: Erythema. The periwound skin appearance did not exhibit: Callus, Crepitus, Excoriation, Induration, Rash, Scarring, Dry/Scaly, Maceration, Atrophie Blanche, Cyanosis, Ecchymosis, Hemosiderin Staining, Mottled, Pallor, Rubor. The surrounding wound skin color is noted with erythema which is circumferential. Periwound temperature was noted as No Abnormality. The periwound has tenderness on palpation. Assessment Active Problems ICD-10 Pressure ulcer of sacral region, stage 4 Cutaneous abscess of buttock Plan Wound Cleansing: Wound #2 Midline Sacrum: Clean wound with Normal Saline. GABRIEL, CONRY ([id number]) May Shower, gently pat wound dry prior to applying new dressing. Anesthetic (add to Medication List):  Wound #2 Midline Sacrum: Topical Lidocaine 4% cream applied to wound bed prior to debridement (In Clinic Only). Primary Wound Dressing: Wound #2 Midline Sacrum: Silver Alginate Secondary Dressing: Wound #2 Midline Sacrum: ABD pad Dressing Change Frequency: Wound #2 Midline Sacrum: Change dressing every other day. Follow-up Appointments: Wound #2 Midline Sacrum: Return Appointment in 1 week. Off-Loading: Wound #2 Midline Sacrum: Turn and reposition every 2 hours My suggestion currently is gonna be that we continue with the above wound care measures for the next week and the patient is in agreement the plan. We will subsequently see were things stand at follow-up. If anything changes or worsens the meantime he or his daughter will contact the office let me know. Please see above for specific wound care orders. We will see patient for re-evaluation in 2 week(s) here in the clinic. If anything worsens or changes patient will contact our office for additional recommendations. Electronic  Signature(s) Signed: 01/17/2019 9:13:28 AM By: Worthy Keeler PA-C Previous Signature: 01/15/2019 8:30:49 AM Version By: Worthy Keeler PA-C Entered By: Worthy Keeler on 01/17/2019 09:11:58 [name] ([id number]) -------------------------------------------------------------------------------- ROS/PFSH Details Patient Name: [name] Date of Service: 01/14/2019 12:45 PM Medical Record Number: [id number] Patient Account Number: [id number] Date of Birth/Sex: 27-Jul-1931 (85 y.o. M) Treating RN: Harold Barban Primary Care Provider: Burman [name] Other Clinician: Referring Provider: Burman [name] Treating Provider/Extender: [name], HOYT Weeks in Treatment: 7 Information Obtained From Patient Wound History Do you currently have one or more open woundso Yes How many open wounds do you currently haveo 1 Approximately how long have you had your woundso 2 or 3 weeks How have you been treating your wound(s) until nowo bordered foam dressing Has your wound(s) ever healed and then re-openedo Yes Have you had any lab work done in the past montho No Have you tested positive for an antibiotic resistant organism (MRSA, VRE)o Yes Have you tested positive for osteomyelitis (bone infection)o No Have you had any tests for circulation on your legso No Constitutional Symptoms (General Health) Complaints and Symptoms: Negative for: Fever; Chills Eyes Medical History: Positive for: Cataracts - bilateral removal Negative for: Glaucoma; Optic Neuritis Ear/Nose/Mouth/Throat Medical History: Negative for: Chronic sinus problems/congestion; Middle ear problems Hematologic/Lymphatic Medical History: Negative for: Anemia; Hemophilia; Human Immunodeficiency Virus; Lymphedema; Sickle Cell Disease Respiratory Complaints and Symptoms: No Complaints or Symptoms Medical History: Positive for: Asthma Negative for: Aspiration; Chronic Obstructive Pulmonary Disease (COPD); Pneumothorax;  Sleep Apnea; Tuberculosis Cardiovascular Complaints and Symptoms: No Complaints or Symptoms Medical HistoryMarland Kitchen ROHIL, LESCH ([id number]) Positive for: Angina; Arrhythmia; Coronary Artery Disease; Hypertension; Myocardial Infarction - 2001 Negative for: Congestive Heart Failure; Deep Vein Thrombosis; Hypotension; Peripheral Arterial Disease; Peripheral Venous Disease; Phlebitis; Vasculitis Gastrointestinal Medical History: Negative for: Cirrhosis ; Colitis; Crohnos; Hepatitis A; Hepatitis B; Hepatitis C Endocrine Medical History: Negative for: Type I Diabetes; Type II Diabetes Past Medical History Notes: Borderline Genitourinary Medical History: Negative for: End Stage Renal Disease Immunological Medical History: Negative for: Lupus Erythematosus; Raynaudos; Scleroderma Integumentary (Skin) Medical History: Negative for: History of Burn; History of pressure wounds Musculoskeletal Medical History: Positive for: Osteoarthritis Negative for: Gout; Rheumatoid Arthritis; Osteomyelitis Neurologic Medical History: Positive for: Dementia; Neuropathy Negative for: Quadriplegia; Paraplegia; Seizure Disorder Oncologic Medical History: Negative for: Received Chemotherapy; Received Radiation Past Medical History Notes: Melanoma on back Psychiatric Complaints and Symptoms: No Complaints or Symptoms Medical History: Negative for: Anorexia/bulimia; Confinement Anxiety HBO Extended History Items MADDYX, WIECK ([id number]) Eyes: Cataracts Immunizations Pneumococcal Vaccine: Received Pneumococcal Vaccination:  Yes Implantable Devices No devices added Hospitalization / Surgery History Name of Hospital Purpose of Hospitalization/Surgery Date [facility] Fall 01/20/2018 Family and Social History Cancer: Yes - Paternal Grandparents; Diabetes: Yes - Father; Heart Disease: Yes - Mother,Father; Hypertension: No; Kidney Disease: No; Lung Disease: No; Seizures: No; Stroke: Yes - Father;  Thyroid Problems: No; Tuberculosis: No; Never smoker; Marital Status - Widowed; Alcohol Use: Never; Drug Use: No History; Caffeine Use: Daily; Financial Concerns: No; Food, Clothing or Shelter Needs: No; Support System Lacking: No; Transportation Concerns: No; Advanced Directives: Yes (Not Provided); Patient does not want information on Advanced Directives; Do not resuscitate: Yes (Not Provided); Living Will: Yes (Not Provided); Medical Power of Attorney: Yes - Yacoub Diltz (Not Provided) Physician Affirmation I have reviewed and agree with the above information. Electronic Signature(s) Signed: 01/15/2019 8:30:49 AM By: Worthy Keeler PA-C Signed: 01/15/2019 3:46:03 PM By: Harold Barban Entered By: Worthy Keeler on 01/15/2019 08:28:36 [name] ([id number]) -------------------------------------------------------------------------------- SuperBill Details Patient Name: [name] Date of Service: 01/14/2019 Medical Record Number: [id number] Patient Account Number: [id number] Date of Birth/Sex: 11-Dec-1930 (85 y.o. M) Treating RN: Harold Barban Primary Care Provider: Burman [name] Other Clinician: Referring Provider: Burman [name] Treating Provider/Extender: [name], HOYT Weeks in Treatment: 7 Diagnosis Coding ICD-10 Codes Code Description L89.154 Pressure ulcer of sacral region, stage 4 L02.31 Cutaneous abscess of buttock Facility Procedures CPT4 Code: 16606004 Description: [phone] - WOUND CARE VISIT-LEV 2 EST PT Modifier: Quantity: 1 Physician Procedures CPT4 Code: [id number] Description: 32023 - WC PHYS LEVEL 4 - EST PT ICD-10 Diagnosis Description L89.154 Pressure ulcer of sacral region, stage 4 L02.31 Cutaneous abscess of buttock Modifier: Quantity: 1 Electronic Signature(s) Signed: 01/15/2019 8:30:49 AM By: Worthy Keeler PA-C Entered By: Worthy Keeler on 01/15/2019 08:29:21

## 2019-01-15 NOTE — Progress Notes (Addendum)
ILIAS, STCHARLES ([id number]) Visit Report for 01/14/2019 Arrival Information Details Patient Name: [name], [name]. Date of Service: 01/14/2019 12:45 PM Medical Record Number: [id number] Patient Account Number: [id number] Date of Birth/Sex: 20-Sep-1931 (85 y.o. M) Treating RN: Army Melia Primary Care Silvestre Mines: Burman Freestone Other Clinician: Referring Clytie Shetley: Burman Freestone Treating Estephani Popper/Extender: [name], HOYT Weeks in Treatment: 7 Visit Information History Since Last Visit Added or deleted any medications: No Patient Arrived: Cane Any new allergies or adverse reactions: No Arrival Time: 12:46 Had a fall or experienced change in No Accompanied By: daughter activities of daily living that may affect Transfer Assistance: None risk of falls: Patient Has Alerts: Yes Signs or symptoms of abuse/neglect since last visito No Patient Alerts: Patient on Blood Thinner Hospitalized since last visit: No Implantable device outside of the clinic excluding No cellular tissue based products placed in the center since last visit: Has Dressing in Place as Prescribed: Yes Pain Present Now: No Electronic Signature(s) Signed: 01/14/2019 2:01:01 PM By: Army Melia Entered By: Army Melia on 01/14/2019 12:46:21 [name] ([id number]) -------------------------------------------------------------------------------- Clinic Level of Care Assessment Details Patient Name: [name] Date of Service: 01/14/2019 12:45 PM Medical Record Number: [id number] Patient Account Number: [id number] Date of Birth/Sex: 1931/10/06 (85 y.o. M) Treating RN: Harold Barban Primary Care Aishwarya Shiplett: Burman Freestone Other Clinician: Referring Louiza Moor: Burman Freestone Treating Delfin Squillace/Extender: [name], HOYT Weeks in Treatment: 7 Clinic Level of Care Assessment Items TOOL 4 Quantity Score []  - Use when only an EandM is performed on FOLLOW-UP visit 0 ASSESSMENTS - Nursing Assessment /  Reassessment X - Reassessment of Co-morbidities (includes updates in patient status) 1 10 X- 1 5 Reassessment of Adherence to Treatment Plan ASSESSMENTS - Wound and Skin Assessment / Reassessment X - Simple Wound Assessment / Reassessment - one wound 1 5 []  - 0 Complex Wound Assessment / Reassessment - multiple wounds []  - 0 Dermatologic / Skin Assessment (not related to wound area) ASSESSMENTS - Focused Assessment []  - Circumferential Edema Measurements - multi extremities 0 []  - 0 Nutritional Assessment / Counseling / Intervention []  - 0 Lower Extremity Assessment (monofilament, tuning fork, pulses) []  - 0 Peripheral Arterial Disease Assessment (using hand held doppler) ASSESSMENTS - Ostomy and/or Continence Assessment and Care []  - Incontinence Assessment and Management 0 []  - 0 Ostomy Care Assessment and Management (repouching, etc.) PROCESS - Coordination of Care X - Simple Patient / Family Education for ongoing care 1 15 []  - 0 Complex (extensive) Patient / Family Education for ongoing care []  - 0 Staff obtains Programmer, systems, Records, Test Results / Process Orders []  - 0 Staff telephones HHA, Nursing Homes / Clarify orders / etc []  - 0 Routine Transfer to another Facility (non-emergent condition) []  - 0 Routine Hospital Admission (non-emergent condition) []  - 0 New Admissions / Biomedical engineer / Ordering NPWT, Apligraf, etc. []  - 0 Emergency Hospital Admission (emergent condition) X- 1 10 Simple Discharge Coordination [name], [name] ([id number]) []  - 0 Complex (extensive) Discharge Coordination PROCESS - Special Needs []  - Pediatric / Minor Patient Management 0 []  - 0 Isolation Patient Management []  - 0 Hearing / Language / Visual special needs []  - 0 Assessment of Community assistance (transportation, D/C planning, etc.) []  - 0 Additional assistance / Altered mentation []  - 0 Support Surface(s) Assessment (bed, cushion, seat, etc.) INTERVENTIONS -  Wound Cleansing / Measurement X - Simple Wound Cleansing - one wound 1 5 []  - 0 Complex Wound Cleansing - multiple wounds X- 1 5 Wound  Imaging (photographs - any number of wounds) []  - 0 Wound Tracing (instead of photographs) X- 1 5 Simple Wound Measurement - one wound []  - 0 Complex Wound Measurement - multiple wounds INTERVENTIONS - Wound Dressings X - Small Wound Dressing one or multiple wounds 1 10 []  - 0 Medium Wound Dressing one or multiple wounds []  - 0 Large Wound Dressing one or multiple wounds []  - 0 Application of Medications - topical []  - 0 Application of Medications - injection INTERVENTIONS - Miscellaneous []  - External ear exam 0 []  - 0 Specimen Collection (cultures, biopsies, blood, body fluids, etc.) []  - 0 Specimen(s) / Culture(s) sent or taken to Lab for analysis []  - 0 Patient Transfer (multiple staff / Civil Service fast streamer / Similar devices) []  - 0 Simple Staple / Suture removal (25 or less) []  - 0 Complex Staple / Suture removal (26 or more) []  - 0 Hypo / Hyperglycemic Management (close monitor of Blood Glucose) []  - 0 Ankle / Brachial Index (ABI) - do not check if billed separately X- 1 5 Vital Signs [name], [name] ([id number]) Has the patient been seen at the hospital within the last three years: Yes Total Score: 75 Level Of Care: New/Established - Level 2 Electronic Signature(s) Signed: 01/14/2019 3:00:04 PM By: Harold Barban Entered By: Harold Barban on 01/14/2019 13:33:36 [name] ([id number]) -------------------------------------------------------------------------------- Encounter Discharge Information Details Patient Name: [name] Date of Service: 01/14/2019 12:45 PM Medical Record Number: [id number] Patient Account Number: [id number] Date of Birth/Sex: 08/17/1931 (85 y.o. M) Treating RN: Army Melia Primary Care Alanta Scobey: Burman Freestone Other Clinician: Referring Vale Mousseau: Burman Freestone Treating  Wanita Derenzo/Extender: [name], HOYT Weeks in Treatment: 7 Encounter Discharge Information Items Discharge Condition: Stable Ambulatory Status: Ambulatory Discharge Destination: Home Transportation: Private Auto Accompanied By: self Schedule Follow-up Appointment: Yes Clinical Summary of Care: Electronic Signature(s) Signed: 01/14/2019 2:01:01 PM By: Army Melia Entered By: Army Melia on 01/14/2019 13:55:47 [name] ([id number]) -------------------------------------------------------------------------------- Lower Extremity Assessment Details Patient Name: [name] Date of Service: 01/14/2019 12:45 PM Medical Record Number: [id number] Patient Account Number: [id number] Date of Birth/Sex: 26-Apr-1931 (85 y.o. M) Treating RN: Army Melia Primary Care Rianne Degraaf: Burman Freestone Other Clinician: Referring Jahmiyah Dullea: Burman Freestone Treating Nuria Phebus/Extender: [name], HOYT Weeks in Treatment: 7 Electronic Signature(s) Signed: 01/14/2019 2:01:01 PM By: Army Melia Entered By: Army Melia on 01/14/2019 12:51:58 [name] ([id number]) -------------------------------------------------------------------------------- Multi Wound Chart Details Patient Name: [name] Date of Service: 01/14/2019 12:45 PM Medical Record Number: [id number] Patient Account Number: [id number] Date of Birth/Sex: Mar 06, 1931 (85 y.o. M) Treating RN: Harold Barban Primary Care Travante Knee: Burman Freestone Other Clinician: Referring Kameo Bains: Burman Freestone Treating Takiera Mayo/Extender: [name], HOYT Weeks in Treatment: 7 Vital Signs Height(in): 69 Pulse(bpm): 70 Weight(lbs): 170.6 Blood Pressure(mmHg): 136/59 Body Mass Index(BMI): 25 Temperature(F): 97.9 Respiratory Rate 16 (breaths/min): Photos: [N/A:N/A] Wound Location: Sacrum - Midline N/A N/A Wounding Event: Bump N/A N/A Primary Etiology: Atypical N/A N/A Comorbid History: Cataracts, Asthma, Angina, N/A  N/A Arrhythmia, Coronary Artery Disease, Hypertension, Myocardial Infarction, Osteoarthritis, Dementia, Neuropathy Date Acquired: 10/30/2018 N/A N/A Weeks of Treatment: 7 N/A N/A Wound Status: Open N/A N/A Measurements L x W x D 2.7x1.7x1.8 N/A N/A (cm) Area (cm) : 3.605 N/A N/A Volume (cm) : 6.489 N/A N/A % Reduction in Area: -44962.50% N/A N/A % Reduction in Volume: -32345.00% N/A N/A Classification: Full Thickness With Exposed N/A N/A Support Structures Exudate Amount: Medium N/A N/A Exudate Type: Purulent N/A N/A Exudate Color: yellow, brown, green N/A  N/A Wound Margin: Indistinct, nonvisible N/A N/A Granulation Amount: Small (1-33%) N/A N/A Granulation Quality: Pale, Hyper-granulation N/A N/A Necrotic Amount: Medium (34-66%) N/A N/A Exposed Structures: Fat Layer (Subcutaneous N/A N/A Tissue) Exposed: Yes [name], [name] ([id number]) Bone: Yes Fascia: No Tendon: No Muscle: No Joint: No Epithelialization: Medium (34-66%) N/A N/A Periwound Skin Texture: Excoriation: No N/A N/A Induration: No Callus: No Crepitus: No Rash: No Scarring: No Periwound Skin Moisture: Maceration: No N/A N/A Dry/Scaly: No Periwound Skin Color: Erythema: Yes N/A N/A Atrophie Blanche: No Cyanosis: No Ecchymosis: No Hemosiderin Staining: No Mottled: No Pallor: No Rubor: No Erythema Location: Circumferential N/A N/A Temperature: No Abnormality N/A N/A Tenderness on Palpation: Yes N/A N/A Wound Preparation: Ulcer Cleansing: N/A N/A Rinsed/Irrigated with Saline Topical Anesthetic Applied: Other: lidocaine 4% Treatment Notes Electronic Signature(s) Signed: 01/14/2019 3:00:04 PM By: Harold Barban Entered By: Harold Barban on 01/14/2019 13:30:50 [name] ([id number]) -------------------------------------------------------------------------------- Multi-Disciplinary Care Plan Details Patient Name: [name], [name] Date of Service: 01/14/2019 12:45 PM Medical Record  Number: [id number] Patient Account Number: [id number] Date of Birth/Sex: 06/19/1931 (85 y.o. M) Treating RN: Harold Barban Primary Care Diarra Ceja: Burman Freestone Other Clinician: Referring Borna Wessinger: Burman Freestone Treating Jakie Debow/Extender: [name], HOYT Weeks in Treatment: 7 Active Inactive Abuse / Safety / Falls / Self Care Management Nursing Diagnoses: Potential for falls Goals: Patient will not experience any injury related to falls Date Initiated: 11/20/2018 Target Resolution Date: 02/15/2019 Goal Status: Active Interventions: Assess fall risk on admission and as needed Notes: Orientation to the Wound Care Program Nursing Diagnoses: Knowledge deficit related to the wound healing center program Goals: Patient/caregiver will verbalize understanding of the [place] Program Date Initiated: 11/20/2018 Target Resolution Date: 02/15/2019 Goal Status: Active Interventions: Provide education on orientation to the wound center Notes: Wound/Skin Impairment Nursing Diagnoses: Impaired tissue integrity Goals: Ulcer/skin breakdown will heal within 14 weeks Date Initiated: 11/20/2018 Target Resolution Date: 02/15/2019 Goal Status: Active Interventions: Assess patient/caregiver ability to obtain necessary supplies [name], [name] ([id number]) Assess patient/caregiver ability to perform ulcer/skin care regimen upon admission and as needed Assess ulceration(s) every visit Notes: Electronic Signature(s) Signed: 01/14/2019 3:00:04 PM By: Harold Barban Entered By: Harold Barban on 01/14/2019 13:30:40 [name] ([id number]) -------------------------------------------------------------------------------- Pain Assessment Details Patient Name: [name] Date of Service: 01/14/2019 12:45 PM Medical Record Number: [id number] Patient Account Number: [id number] Date of Birth/Sex: Oct 13, 1931 (85 y.o. M) Treating RN: Army Melia Primary Care Delance Weide: Burman Freestone Other Clinician: Referring Cordaro Mukai: Burman Freestone Treating Javoni Lucken/Extender: [name], HOYT Weeks in Treatment: 7 Active Problems Location of Pain Severity and Description of Pain Patient Has Paino No Site Locations Pain Management and Medication Current Pain Management: Electronic Signature(s) Signed: 01/14/2019 2:01:01 PM By: Army Melia Entered By: Army Melia on 01/14/2019 12:46:29 [name] ([id number]) -------------------------------------------------------------------------------- Patient/Caregiver Education Details Patient Name: [name] Date of Service: 01/14/2019 12:45 PM Medical Record Number: [id number] Patient Account Number: [id number] Date of Birth/Gender: Oct 05, 1931 (85 y.o. M) Treating RN: Harold Barban Primary Care Physician: Burman Freestone Other Clinician: Referring Physician: Burman Freestone Treating Physician/Extender: Sharalyn Ink in Treatment: 7 Education Assessment Education Provided To: Patient Education Topics Provided Wound/Skin Impairment: Handouts: Caring for Your Ulcer Methods: Demonstration, Explain/Verbal Responses: State content correctly Electronic Signature(s) Signed: 01/14/2019 3:00:04 PM By: Harold Barban Entered By: Harold Barban on 01/14/2019 13:31:58 [name] ([id number]) -------------------------------------------------------------------------------- Wound Assessment Details Patient Name: [name] Date of Service: 01/14/2019 12:45 PM Medical Record Number: [id number] Patient Account Number: [id number] Date  of Birth/Sex: 18-Jan-1931 (85 y.o. M) Treating RN: Army Melia Primary Care Trenese Haft: Burman Freestone Other Clinician: Referring Dayron Odland: Burman Freestone Treating Valmore Arabie/Extender: [name], HOYT Weeks in Treatment: 7 Wound Status Wound Number: 2 Primary Pressure Ulcer Etiology: Wound Location: Sacrum - Midline Secondary Abscess Wounding Event: Gradually  Appeared Etiology: Date Acquired: 10/30/2018 Wound Open Weeks Of Treatment: 7 Status: Clustered Wound: No Comorbid Cataracts, Asthma, Angina, Arrhythmia, History: Coronary Artery Disease, Hypertension, Myocardial Infarction, Osteoarthritis, Dementia, Neuropathy Photos Wound Measurements Length: (cm) 2.7 Width: (cm) 1.7 Depth: (cm) 1.8 Area: (cm) 3.605 Volume: (cm) 6.489 % Reduction in Area: -44962.5% % Reduction in Volume: -32345% Epithelialization: Medium (34-66%) Tunneling: No Undermining: No Wound Description Classification: Category/Stage IV Foul Odor A Wound Margin: Indistinct, nonvisible Slough/Fibr Exudate Amount: Medium Exudate Type: Purulent Exudate Color: yellow, brown, green fter Cleansing: No ino No Wound Bed Granulation Amount: Small (1-33%) Exposed Structure Granulation Quality: Pale, Hyper-granulation Fascia Exposed: No Necrotic Amount: Medium (34-66%) Fat Layer (Subcutaneous Tissue) Exposed: Yes Necrotic Quality: Adherent Slough Tendon Exposed: No Muscle Exposed: No Joint Exposed: No Bone Exposed: Yes [name], [name] ([id number]) Periwound Skin Texture Texture Color No Abnormalities Noted: No No Abnormalities Noted: No Callus: No Atrophie Blanche: No Crepitus: No Cyanosis: No Excoriation: No Ecchymosis: No Induration: No Erythema: Yes Rash: No Erythema Location: Circumferential Scarring: No Hemosiderin Staining: No Mottled: No Moisture Pallor: No No Abnormalities Noted: No Rubor: No Dry / Scaly: No Maceration: No Temperature / Pain Temperature: No Abnormality Tenderness on Palpation: Yes Wound Preparation Ulcer Cleansing: Rinsed/Irrigated with Saline Topical Anesthetic Applied: Other: lidocaine 4%, Treatment Notes Wound #2 (Midline Sacrum) Notes silver cell, BFD Electronic Signature(s) Signed: 01/17/2019 9:13:28 AM By: Worthy Keeler PA-C Signed: 01/17/2019 12:51:27 PM By: Army Melia Previous Signature: 01/14/2019  2:01:01 PM Version By: Army Melia Entered By: Worthy Keeler on 01/17/2019 09:11:38 TAHSIN, [name] ([id number]) -------------------------------------------------------------------------------- Vitals Details Patient Name: [name] Date of Service: 01/14/2019 12:45 PM Medical Record Number: [id number] Patient Account Number: [id number] Date of Birth/Sex: [birth year] (85 y.o. M) Treating RN: Army Melia Primary Care Liesa Tsan: Burman Freestone Other Clinician: Referring Jacara Benito: Burman Freestone Treating Johari Bennetts/Extender: [name], HOYT Weeks in Treatment: 7 Vital Signs Time Taken: 12:46 Temperature (F): 97.9 Height (in): 69 Pulse (bpm): 70 Weight (lbs): 170.6 Respiratory Rate (breaths/min): 16 Body Mass Index (BMI): 25.2 Blood Pressure (mmHg): 136/59 Reference Range: 80 - 120 mg / dl Electronic Signature(s) Signed: 01/14/2019 2:01:01 PM By: Army Melia Entered By: Army Melia on 01/14/2019 12:46:51

## 2019-01-21 ENCOUNTER — Ambulatory Visit: Payer: Medicare Other | Admitting: Family Medicine

## 2019-01-22 ENCOUNTER — Encounter: Payer: Medicare Other | Attending: Physician Assistant | Admitting: Physician Assistant

## 2019-01-22 ENCOUNTER — Other Ambulatory Visit: Payer: Self-pay

## 2019-01-22 DIAGNOSIS — I10 Essential (primary) hypertension: Secondary | ICD-10-CM | POA: Diagnosis not present

## 2019-01-22 DIAGNOSIS — M1990 Unspecified osteoarthritis, unspecified site: Secondary | ICD-10-CM | POA: Insufficient documentation

## 2019-01-22 DIAGNOSIS — I2510 Atherosclerotic heart disease of native coronary artery without angina pectoris: Secondary | ICD-10-CM | POA: Insufficient documentation

## 2019-01-22 DIAGNOSIS — J45909 Unspecified asthma, uncomplicated: Secondary | ICD-10-CM | POA: Diagnosis not present

## 2019-01-22 DIAGNOSIS — I252 Old myocardial infarction: Secondary | ICD-10-CM | POA: Diagnosis not present

## 2019-01-22 DIAGNOSIS — F0390 Unspecified dementia without behavioral disturbance: Secondary | ICD-10-CM | POA: Insufficient documentation

## 2019-01-22 DIAGNOSIS — E1140 Type 2 diabetes mellitus with diabetic neuropathy, unspecified: Secondary | ICD-10-CM | POA: Diagnosis not present

## 2019-01-22 DIAGNOSIS — Z85820 Personal history of malignant melanoma of skin: Secondary | ICD-10-CM | POA: Insufficient documentation

## 2019-01-22 DIAGNOSIS — L0231 Cutaneous abscess of buttock: Secondary | ICD-10-CM | POA: Diagnosis not present

## 2019-01-22 DIAGNOSIS — L89154 Pressure ulcer of sacral region, stage 4: Secondary | ICD-10-CM | POA: Insufficient documentation

## 2019-01-22 NOTE — Progress Notes (Signed)
[name], [name] ([id number]) Visit Report for 01/22/2019 Chief Complaint Document Details Patient Name: [name], [name]. Date of Service: 01/22/2019 12:30 PM Medical Record Number: [id number] Patient Account Number: [id number] Date of Birth/Sex: 1931/10/11 (85 y.o. M) Treating RN: Montey Hora Primary Care Provider: Burman Freestone Other Clinician: Referring Provider: Burman Freestone Treating Provider/Extender: [name], HOYT Weeks in Treatment: 9 Information Obtained from: Patient Chief Complaint Sacral pressure ulcer Electronic Signature(s) Signed: 01/22/2019 5:10:39 PM By: Worthy Keeler PA-C Entered By: Worthy Keeler on 01/22/2019 12:52:10 [name] ([id number]) -------------------------------------------------------------------------------- Debridement Details Patient Name: [name] Date of Service: 01/22/2019 12:30 PM Medical Record Number: [id number] Patient Account Number: [id number] Date of Birth/Sex: July 26, 1931 (85 y.o. M) Treating RN: Montey Hora Primary Care Provider: Burman Freestone Other Clinician: Referring Provider: Burman Freestone Treating Provider/Extender: [name], HOYT Weeks in Treatment: 9 Debridement Performed for Wound #2 Midline Sacrum Assessment: Performed By: Physician STONE III, HOYT E., PA-C Debridement Type: Debridement Level of Consciousness (Pre- Awake and Alert procedure): Pre-procedure Verification/Time Yes - 12:55 Out Taken: Start Time: 12:55 Pain Control: Lidocaine 4% Topical Solution Total Area Debrided (L x W): 2.5 (cm) x 1.7 (cm) = 4.25 (cm) Tissue and other material Viable, Non-Viable, Slough, Subcutaneous, Slough debrided: Level: Skin/Subcutaneous Tissue Debridement Description: Excisional Instrument: Curette Bleeding: Minimum Hemostasis Achieved: Pressure End Time: 12:59 Procedural Pain: 0 Post Procedural Pain: 0 Response to Treatment: Procedure was tolerated well Level of Consciousness Awake and  Alert (Post-procedure): Post Debridement Measurements of Total Wound Length: (cm) 2.5 Stage: Category/Stage IV Width: (cm) 1.7 Depth: (cm) 0.9 Volume: (cm) 3.004 Character of Wound/Ulcer Post Improved Debridement: Post Procedure Diagnosis Same as Pre-procedure Electronic Signature(s) Signed: 01/22/2019 2:58:29 PM By: Montey Hora Signed: 01/22/2019 5:10:39 PM By: Worthy Keeler PA-C Entered By: Montey Hora on 01/22/2019 12:59:22 [name] ([id number]) -------------------------------------------------------------------------------- HPI Details Patient Name: [name] Date of Service: 01/22/2019 12:30 PM Medical Record Number: [id number] Patient Account Number: [id number] Date of Birth/Sex: 03-10-1931 (85 y.o. M) Treating RN: Montey Hora Primary Care Provider: Burman Freestone Other Clinician: Referring Provider: Burman Freestone Treating Provider/Extender: [name], HOYT Weeks in Treatment: 9 History of Present Illness HPI Description: 02/06/18 on evaluation today patient presents for initial evaluation and our clinic concerning an issue which began roughly 3 weeks ago when the patient fell in his home on the floor in his kitchen and laid him down this detergent for roughly 3 days. He had a pressure injury to the left shoulder. This unfortunately has caused him a lot of discomfort although it finally seems to be doing better if anything is really having a lot of itching right now. This appears potentially be a contact dermatitis issue. He also has a significant pressure injury to the sacrum at this time as well which is also showing fascia exposure right over the bone but no evidence of bone exposure at this point which is good news. They have been using Santyl as well as Saline soaked gauze at this point in time. There does appear to be a lot of necrotic slough in the base of the wound. He does have a history of incontinence, myocardial infarction, and  hypertension. He also is "borderline diabetic" hemoglobin A1c of 6.0. Currently he has some discomfort in the pressure site at the sacrum but fortunately nothing too significant this did require sharp debridement today. 02/13/18 on evaluation today patient appears to be doing much better in regard to his sacral wound. He has been tolerating the dressing changes  without complication with the Vashe. Fortunately there is no evidence of infection and though there is some Slough on the surface of the wound bed he has excellent granulation noted. Overall I'm pleased with how things have progressed in that regard. A glance at his shoulder as well and the rash seems to be someone improving in my pinion at this site as well. Overall I am pleased with what we're seeing and so is the family. 02/20/18 on evaluation today patient appears to be doing a little worse in regard to the sacral wound only in the fact that there is redness surrounding it has me somewhat concerned for infection. The drainage has also apparently been a little bit off color compared to normal according to family they did keep the dressing today that was removed to show me and I agree this seems to be a little bit different compared to what we have been seeing. Coupled with the redness I'm concerned he may be developing some cellulitis surrounding the wound bed. 02/27/18 on evaluation today patient presents for follow-up concerning his sacral ulcer. We have received the results back from his wound culture which shows unfortunately that the doxycycline will not be of benefit for him I am going to need to initiate treatment with something else in order to treat the pseudomonas. Otherwise he does not seem to be having any significant pain although his daughter states there are sometimes when he states having pain. We continue to use the Vashe currently. 03/06/18 on evaluation today patient's sacral wound appears to be doing better in my opinion. He  has been tolerating the dressing changes without complication. With that being said the silver nitrate has helped with the prominent area of hyper granulation at the 6 o'clock location we will likely need to repeat this again today. Nonetheless overall I am pleased with how things have improved over the last week. The erythema surrounding the wound seems to be greatly improved. 03/13/18 on evaluation today patient's wound actually does not appear to be terribly infected although she does continue to have erythema surrounding the wound bed especially on the left border. I'm still somewhat concerned about the fact that the oral antibiotics alone may not be completely treating his infection. I previously discussed may need to go for IV antibiotic therapy I'm concerned that may be the case. We will need to make a referral today for infectious disease. 03/20/18 on evaluation today the patient sacral wound actually appears to be doing fairly well in regard to granulation although he continues unfortunately to have it your theme is surrounding the periwound region. There's also some increased swelling at the 6 o'clock location which also has me somewhat concerned. With that being said he does have some discomfort but nothing too significant at this point. He still has not heard from infectious disease his daughter and wife are both present during the office visit today they're going to check back with this again. We did get the information for them to call them today. 03/27/18 on evaluation today patient is seen concerning his ongoing sacral ulcer. He has been tolerating the dressing changes without complication. With that being said he does present with evidence of bright green drainage noted on the dressing which again is something that I do often expect to see with a pseudomonas infection. He continues to have your theme is BARRINGTON, WORLEY ([id number]) surrounding the wound bed as well and again I'm not  100% convinced this is just pressure related. I  did speak with Colletta Maryland who is the nurse practitioner in [place] with infectious disease. I spoke with her actually yesterday concerning this patient. She is not 100% convinced that this is infected. She question whether the wound may just be colonized with Pseudomonas and not actually causing an active infection. I am really not thinking that the edema is associated with pressure alone and again overall I don't feel that your theme and is consistent with a pressure injury either as he's never had any contusions noted like a deep tissue injury on his heel which was new and I did visualize today this was on the left heel. Nonetheless she wanted to give this a little bit more time and thought it would be appropriate to start the Wound VAC at this point. 04/03/18 on evaluation today patient sacral ulcer actually appears to be doing fairly well at this point. He has been tolerating the dressing changes without complication. With that being said I'm very pleased with the progress that has been made in regard to his sacral wound over the past week I do not see as much in the way of erythema which is great news. Nonetheless he does have a small area of hyper granulation unfortunately. This is at roughly the 7 o'clock location and I think does need to be addressed so that this will heal more appropriately. Nonetheless I think we may be ready to go ahead and initiate therapy with the Wound VAC. 04/10/18 on evaluation today patient appears to be doing excellent in regard to his sacral ulcer. The show signs of great improvement in overall I'm very pleased with how things look. He has been tolerating the dressing changes without complication. Specifically this is the Wound VAC. He also seems to be doing well with the antibiotic there is decreased your theme and redness surrounding the sacral area at this point in the wound has filled in quite  significantly. 04/17/18 on evaluation today patient actually appears to be doing excellent in regard to his sacral ulcer. He's been tolerating the dressing changes without complication specifically the Wound VAC. There really are no major concerns from the patient nor family this point he is having no pain. He does have a little bit of Epiboly on the lateral portions of the wound where he does have a little bit more depth that will need to be addressed today. 04/23/18 on evaluation today patient's wound actually appears to be doing excellent at this point. He has been tolerating the Wound VAC and this appears to be doing well other than the fact that it seems to be breaking seal at the 6 o'clock location. I do believe that adding a duodenum dressing at this location try and help maintain the seal would be appropriate and likely very effective. With that being said he overall seems to be showing signs of good improvement at this point. There does not appear to be any evidence of significant infection which is also excellent news. 04/30/18 on evaluation today patient actually appears to be doing well in regard to his sacral ulcer. He's been tolerating the dressing changes without complication. Fortunately there does not appear to be any evidence of infection. Overall I'm very pleased with the progress that has been made up to this point. He does have some blistering underneath the draping unfortunately although this is definitely something that has been noted on other patients previously is a fairly common occurrence. Nonetheless the patient seems to be doing fairly well in general in my  pinion based on what I see at this time. I do believe these are fairly superficial and minor. 05/07/18 on evaluation today patient's wound actually appears to be doing excellent at this point in time. He has been tolerating the Wound VAC decently well he states that it is somewhat cumbersome to carry around unfortunately.  The only other issue he's been having according to family is that they been having a difficult time keeping the Wound VAC in place and doing what is supposed to do without making. Obviously I do think that this is definitely of concern. Nonetheless I do believe she's made good progress up to this point. I'm very happy in that regard. 05/14/18 on evaluation today patient's wound continues to make good progress at this point. He had a minimal amount of slough noted on the surface which was easily wiped away with saline and gauze and in general I feel like that he is continuing to show excellent progress even with the discontinuation of the Wound VAC. Overall I'm pleased in this regard. He was having issues with the Wound VAC in getting it to seal I think that using the Prisma at this time has been equally efficient and getting the wound to diminish in size. 05/29/18- He is here in follow up evaluation for a sacral ulcer. There is improvement, we will continue with prisma and he will follow up in two weeks 06/11/18 on evaluation today patient had unfortunately bright green drainage on the dressing upon evaluation today. This is something that we have encountered before although we were able to get things under control previously with antibiotics. With that being said currently upon further inspection of the three areas of hyper granulation that were separate from the actual wound itself which was almost healed it really appears that these all have some depth to them. They are more tunnels that really have not closed or at least have reopened as a result likely of infection in my pinion. This is definitely not what I was expecting or hoping for. [name], [name] ([id number]) 06/26/18 on evaluation today patient continues to experience issues with what appears to be small abscesses in the sacral region unfortunately. With that being said he has been tolerating the dressing changes without complication  which is good news. He's not having any significant discomfort which is also good news. With that being said his daughter states that after he left last week that the packing that we have placed fell out quite rapidly and he subsequently healed over very quick to the point they were not able to even repack the regions. Nonetheless there appear to be several fluctuance areas noted at this point there's one central region that does seem to be draining still discharge that is somewhat green in color. I did review the results of the wound culture which did show evidence of infection with both MRSA as well as pseudomonas based on that result. Nonetheless again with his other current medications we are not able to do the Cipro due to issues with potential long QT syndrome. I am going to give him a prescription for doxycycline in order to help with the potential MRSA infection. 07/09/18 on evaluation today patient actually appears to be doing about the same in regard to his sacral wound. He actually has his MRI scheduled for tomorrow and then subsequently is going to be having his infectious disease appointment for Thursday of this week. Fortunately he's not having any significant discomfort he still has your theme  in the sacral region he still has several blister/flux went areas although they technically are not blisters this is more like a underlying abscess. The one area that is open still does probe down to bone. Again I am concerned about a deeper infection possibly even sacral osteomyelitis. 07/23/18 on evaluation today patient actually appears to be doing very well all things considered in regard to his sacral ulcer. Since I've last seen him he actually did have his MRI performed which showed that he has a complex fluid collection superficial to the distal sacrum measuring 5.9 x 4.4 x 2.8 cm which abuts the posterior aspect of the distal sacrum and the sacrum itself shows cortical destruction  consistent with osteomyelitis. She has also been seen by infectious disease and currently orders have been initiated for IV antibiotic therapy for the next eight weeks. He was seen by Janene Madeira NP and placed on Ceftazidime and Daptomycin. Currently he has not really been on this quite long enough to see a sufficient response to the new orders as far as antibiotic therapy is concerned. Nonetheless it does appear that he is likely on the right track at this point which is great news. Nonetheless the question which both Colletta Maryland and myself have discussed both with the patient and between ourselves is whether or not the patient may need to be seen by surgeon for surgical evacuation of the fluid collection/abscess and possible debridement of the sacrum itself. Nonetheless at this time my personal opinion is probably gonna be that we wait and get this at least a couple weeks to see the response that he receives with the IV antibiotic therapy. 08/06/18 on evaluation today patient actually appears to be doing rather well in regard to his sacral ulcer region all things considered. He has been tolerating the dressing changes without complication. With that being said there is not any obvious opening nor any drainage noted at this point in time upon evaluation. The patient has been tolerating the dressing changes though. He's doing well with the IV antibiotic therapy. 08/20/18 on evaluation today patient appears to be doing wonderful in regard to his sacral region. In fact there appears to be no wound opening or drainage at this time. Overall I'm very pleased with how things have progressed. The patient likewise as well as his wife and daughter are very happy as well. Readmission: 11/20/18 on evaluation today patient presents for reevaluation our clinic concerning issues with his sacral region. Unfortunately the area in question is the same region which we previously treated what we thought would  successfully back in November 2019. At that time the patient underwent IV antibiotic therapy which seemed to do the job very well. Subsequently however in the past couple of weeks he has begun to have drainage and bleeding from the sacral region and is having increased pain yet again. Fortunately there is no signs of systemic infection although it does appear that the complex abscess that was previously noted may not have fully cleared there was a question between myself as well is infectious disease previous whether not he needed to see a surgeon being that things got better the family opted not to see a surgeon at that time. Nonetheless I am concerned that he may the need to see a surgeon in order to have this area surgically debrided and possibly a bone culture obtained in order to get a better idea of what we're treating and ensure that this is able to completely and fully heal. 11/27/18  on evaluation today patient appears to be doing about the same in regard to his sacral ulcer. He did see Dr. Lysle Pearl yesterday and the plan is to proceed forward with surgery to clean out the region. This seems to be the most appropriate goal of treatment at this point. Dr. Lysle Pearl just needed to speak with Dr. Ellene Route the patient's neurologist in order to get clearance as the patient was supposed to be scheduled for a carpal tunnel and elbow surgery with Dr. Ellene Route. Nonetheless he has apparently given clearance to proceed with this surgery for the sacral region which is deemed more important at this point. Unfortunately the patient had a little bit of increased pain although he is having redness at this point there does not appear to be any spreading infection which is good news. No fevers, chills, nausea, or vomiting noted at this time. MYRON, STANKOVICH ([id number]) 12/03/18 on evaluation today patient actually appears to be doing about the same in regard to the sacral ulcer. He still waiting to hear back from Dr.  Ines Bloomer office in regard to scheduling his surgery. Fortunately there's no signs of systemic infection at this time which is good news. Unfortunately he really does not seem to making any progress the doxycycline does seem to at least be beneficial in helping to hold off the infection from worsening although unfortunately he still has a lot going on in this regard. Patient's daughter states she's actually gonna contact her office tomorrow to see what exactly is going on. 12/10/18 on evaluation today patient actually appears to be doing about the same in regard to her sacral room. Apparently he still waiting on approval from both cardiology as well as Dr. Ellene Route although apparently he's Artie gotten a formal letter from Dr. Ellene Route stated that he was clear to proceed with the sacral surgery. Di Kindle actually found the clearance letter from cardiology in epic as well today and this subsequently was given to the patient's daughter as that seems to be the only thing that was holding up proceeding with the surgery. Hopefully should be able to take that over to the surgeon's office today in order to go ahead and get this scheduled. 12/24/18 on evaluation today patient actually appears to be doing very well in regard to his sacral ulcer compared to when I last saw him. He has had surgery Dr. Lysle Pearl and it does appear that he was able to clear out this area of abscess very well without complication. This did extend down to the bone and a portion of the bone was sent for pathology and culture. Although on the pathology report it appears this is more tissue and not bone noted. With that being said the culture from the bone and Henrene Pastor asked him this was obtained revealed Corynebacterium striatum with no anaerobes isolated. Dr. Lysle Pearl did not place the patient on the antibiotics at this point. Again I have been debating on whether or not this is the patient to infectious disease I think that I am going to do that at  this point to gain their opinion on whether or not there's anything we should be treated in this regard and if so will be the best treatment options. 12/31/18 on evaluation today patient actually appears to be doing okay in regard to his sacral wound. Fortunately there does not appear to be any evidence of infection at this time which is good news. He has been tolerating the dressing's without complication. With that being said he did see  infectious disease and apparently they realize that the pathology samples sent from the operating room was actually. I'll see him and not bone and the question is whether the culture and sample or just contamination from the skin of not guilty and active infection. The daughter is very concerned about this she states that we may need to get a piece of bone to send for examination to ensure there's nothing more severe going on here. Obviously with this history of healing and then obsesses forming and then having to go for surgery now and reopening everything they have a reason to be concerned I completely agree. 01/07/19 on evaluation today patient actually appears to be doing well in regard to his sacral wound. Again we did get results back of his bone culture as well as the pathology which showed no evidence of osteomyelitis at this point this is excellent news. Overall I think that he likely does not need to go forward with the MRI since everything seems to be showing negative at this time. Especially in light of the fact that the wound appears to be doing well and that the infection and everything was running the wound bed seems to be dramatically improved. Patient's daughter is in agreement with this plan. 01/14/19 on evaluation today patient appears to be doing rather well at this time as far as the overall appearance of the sacral wound is concerned. The depth is slightly improved he has some granulation tissue noted there still is bone in the very bottom of the  wound bed although I'm no longer able to palpate this with my finger I can only tell by probing with the sterile cotton swab. Nonetheless I do believe this is shown signs of good improvement in healing as far as what I'm seeing at this point. 01/22/19 on evaluation today patient's wound in the sacral region appears to be doing rather well. Fortunately there does not seem to be any evidence of active infection at this time which is good news but patient overall has been doing excellent currently. I'm very pleased with how things have progressed over the past week. Electronic Signature(s) Signed: 01/22/2019 5:10:39 PM By: Worthy Keeler PA-C Entered By: Worthy Keeler on 01/22/2019 13:05:15 [name] ([id number]) -------------------------------------------------------------------------------- Physical Exam Details Patient Name: TRISTAN, PROTO Date of Service: 01/22/2019 12:30 PM Medical Record Number: [id number] Patient Account Number: [id number] Date of Birth/Sex: August 13, 1931 (85 y.o. M) Treating RN: Montey Hora Primary Care Provider: Burman Freestone Other Clinician: Referring Provider: Burman Freestone Treating Provider/Extender: STONE III, HOYT Weeks in Treatment: 9 Constitutional Well-nourished and well-hydrated in no acute distress. Respiratory normal breathing without difficulty. Psychiatric this patient is able to make decisions and demonstrates good insight into disease process. Alert and Oriented x 3. pleasant and cooperative. Notes Upon inspection today patient's wound bed showed signs of good granulation currently he did have some Slough noted on the surface of the wound which required sharp debridement today this was performed without complication post debridement the wound bed appears to be doing significantly better which is excellent news. Overall very pleased with the appearance of the wound bed at this point. Electronic Signature(s) Signed: 01/22/2019 5:10:39 PM  By: Worthy Keeler PA-C Entered By: Worthy Keeler on 01/22/2019 13:06:06 [name] ([id number]) -------------------------------------------------------------------------------- Physician Orders Details Patient Name: KHARI, MALLY Date of Service: 01/22/2019 12:30 PM Medical Record Number: [id number] Patient Account Number: [id number] Date of Birth/Sex: 08/15/1931 (85 y.o. M) Treating RN: Montey Hora Primary Care  Provider: Burman Freestone Other Clinician: Referring Provider: Burman Freestone Treating Provider/Extender: [name], HOYT Weeks in Treatment: 9 Verbal / Phone Orders: No Diagnosis Coding ICD-10 Coding Code Description L89.154 Pressure ulcer of sacral region, stage 4 L02.31 Cutaneous abscess of buttock Wound Cleansing Wound #2 Midline Sacrum o Clean wound with Normal Saline. o May Shower, gently pat wound dry prior to applying new dressing. Anesthetic (add to Medication List) Wound #2 Midline Sacrum o Topical Lidocaine 4% cream applied to wound bed prior to debridement (In Clinic Only). Primary Wound Dressing Wound #2 Midline Sacrum o Silver Alginate o Collagen - to wound bed Secondary Dressing Wound #2 Midline Sacrum o Boardered Foam Dressing Dressing Change Frequency Wound #2 Midline Sacrum o Change dressing every other day. Follow-up Appointments Wound #2 Midline Sacrum o Return Appointment in 1 week. Off-Loading Wound #2 Midline Sacrum o Turn and reposition every 2 hours Electronic Signature(s) Signed: 01/22/2019 2:58:29 PM By: Montey Hora Signed: 01/22/2019 5:10:39 PM By: Worthy Keeler PA-C Entered By: Montey Hora on 01/22/2019 13:00:58 [name], [name] ([id number]) [name], [name] ([id number]) -------------------------------------------------------------------------------- Problem List Details Patient Name: WINTHROP, SHANNAHAN Date of Service: 01/22/2019 12:30 PM Medical Record Number: [id number] Patient Account  Number: [id number] Date of Birth/Sex: 1931/02/03 (85 y.o. M) Treating RN: Montey Hora Primary Care Provider: Burman Freestone Other Clinician: Referring Provider: Burman Freestone Treating Provider/Extender: [name], HOYT Weeks in Treatment: 9 Active Problems ICD-10 Evaluated Encounter Code Description Active Date Today Diagnosis L89.154 Pressure ulcer of sacral region, stage 4 11/20/2018 No Yes L02.31 Cutaneous abscess of buttock 11/20/2018 No Yes Inactive Problems Resolved Problems Electronic Signature(s) Signed: 01/22/2019 5:10:39 PM By: Worthy Keeler PA-C Entered By: Worthy Keeler on 01/22/2019 12:52:01 [name] ([id number]) -------------------------------------------------------------------------------- Progress Note Details Patient Name: [name] Date of Service: 01/22/2019 12:30 PM Medical Record Number: [id number] Patient Account Number: [id number] Date of Birth/Sex: [birth year] (85 y.o. M) Treating RN: Montey Hora Primary Care Provider: Burman Freestone Other Clinician: Referring Provider: Burman Freestone Treating Provider/Extender: [name], HOYT Weeks in Treatment: 9 Subjective Chief Complaint Information obtained from Patient Sacral pressure ulcer History of Present Illness (HPI) 02/06/18 on evaluation today patient presents for initial evaluation and our clinic concerning an issue which began roughly 3 weeks ago when the patient fell in his home on the floor in his kitchen and laid him down this detergent for roughly 3 days. He had a pressure injury to the left shoulder. This unfortunately has caused him a lot of discomfort although it finally seems to be doing better if anything is really having a lot of itching right now. This appears potentially be a contact dermatitis issue. He also has a significant pressure injury to the sacrum at this time as well which is also showing fascia exposure right over the bone but no evidence of bone exposure at  this point which is good news. They have been using Santyl as well as Saline soaked gauze at this point in time. There does appear to be a lot of necrotic slough in the base of the wound. He does have a history of incontinence, myocardial infarction, and hypertension. He also is "borderline diabetic" hemoglobin A1c of 6.0. Currently he has some discomfort in the pressure site at the sacrum but fortunately nothing too significant this did require sharp debridement today. 02/13/18 on evaluation today patient appears to be doing much better in regard to his sacral wound. He has been tolerating the dressing changes without complication with the Vashe. Fortunately  there is no evidence of infection and though there is some Slough on the surface of the wound bed he has excellent granulation noted. Overall I'm pleased with how things have progressed in that regard. A glance at his shoulder as well and the rash seems to be someone improving in my pinion at this site as well. Overall I am pleased with what we're seeing and so is the family. 02/20/18 on evaluation today patient appears to be doing a little worse in regard to the sacral wound only in the fact that there is redness surrounding it has me somewhat concerned for infection. The drainage has also apparently been a little bit off color compared to normal according to family they did keep the dressing today that was removed to show me and I agree this seems to be a little bit different compared to what we have been seeing. Coupled with the redness I'm concerned he may be developing some cellulitis surrounding the wound bed. 02/27/18 on evaluation today patient presents for follow-up concerning his sacral ulcer. We have received the results back from his wound culture which shows unfortunately that the doxycycline will not be of benefit for him I am going to need to initiate treatment with something else in order to treat the pseudomonas. Otherwise he does  not seem to be having any significant pain although his daughter states there are sometimes when he states having pain. We continue to use the Vashe currently. 03/06/18 on evaluation today patient's sacral wound appears to be doing better in my opinion. He has been tolerating the dressing changes without complication. With that being said the silver nitrate has helped with the prominent area of hyper granulation at the 6 o'clock location we will likely need to repeat this again today. Nonetheless overall I am pleased with how things have improved over the last week. The erythema surrounding the wound seems to be greatly improved. 03/13/18 on evaluation today patient's wound actually does not appear to be terribly infected although she does continue to have erythema surrounding the wound bed especially on the left border. I'm still somewhat concerned about the fact that the oral antibiotics alone may not be completely treating his infection. I previously discussed may need to go for IV antibiotic therapy I'm concerned that may be the case. We will need to make a referral today for infectious disease. 03/20/18 on evaluation today the patient sacral wound actually appears to be doing fairly well in regard to granulation although he continues unfortunately to have it your theme is surrounding the periwound region. There's also some increased swelling at the 6 o'clock location which also has me somewhat concerned. With that being said he does have some discomfort but [name], [name]. ([id number]) nothing too significant at this point. He still has not heard from infectious disease his daughter and wife are both present during the office visit today they're going to check back with this again. We did get the information for them to call them today. 03/27/18 on evaluation today patient is seen concerning his ongoing sacral ulcer. He has been tolerating the dressing changes without complication. With that being  said he does present with evidence of bright green drainage noted on the dressing which again is something that I do often expect to see with a pseudomonas infection. He continues to have your theme is surrounding the wound bed as well and again I'm not 100% convinced this is just pressure related. I did speak with Colletta Maryland who is  the nurse practitioner in [place] with infectious disease. I spoke with her actually yesterday concerning this patient. She is not 100% convinced that this is infected. She question whether the wound may just be colonized with Pseudomonas and not actually causing an active infection. I am really not thinking that the edema is associated with pressure alone and again overall I don't feel that your theme and is consistent with a pressure injury either as he's never had any contusions noted like a deep tissue injury on his heel which was new and I did visualize today this was on the left heel. Nonetheless she wanted to give this a little bit more time and thought it would be appropriate to start the Wound VAC at this point. 04/03/18 on evaluation today patient sacral ulcer actually appears to be doing fairly well at this point. He has been tolerating the dressing changes without complication. With that being said I'm very pleased with the progress that has been made in regard to his sacral wound over the past week I do not see as much in the way of erythema which is great news. Nonetheless he does have a small area of hyper granulation unfortunately. This is at roughly the 7 o'clock location and I think does need to be addressed so that this will heal more appropriately. Nonetheless I think we may be ready to go ahead and initiate therapy with the Wound VAC. 04/10/18 on evaluation today patient appears to be doing excellent in regard to his sacral ulcer. The show signs of great improvement in overall I'm very pleased with how things look. He has been tolerating the dressing  changes without complication. Specifically this is the Wound VAC. He also seems to be doing well with the antibiotic there is decreased your theme and redness surrounding the sacral area at this point in the wound has filled in quite significantly. 04/17/18 on evaluation today patient actually appears to be doing excellent in regard to his sacral ulcer. He's been tolerating the dressing changes without complication specifically the Wound VAC. There really are no major concerns from the patient nor family this point he is having no pain. He does have a little bit of Epiboly on the lateral portions of the wound where he does have a little bit more depth that will need to be addressed today. 04/23/18 on evaluation today patient's wound actually appears to be doing excellent at this point. He has been tolerating the Wound VAC and this appears to be doing well other than the fact that it seems to be breaking seal at the 6 o'clock location. I do believe that adding a duodenum dressing at this location try and help maintain the seal would be appropriate and likely very effective. With that being said he overall seems to be showing signs of good improvement at this point. There does not appear to be any evidence of significant infection which is also excellent news. 04/30/18 on evaluation today patient actually appears to be doing well in regard to his sacral ulcer. He's been tolerating the dressing changes without complication. Fortunately there does not appear to be any evidence of infection. Overall I'm very pleased with the progress that has been made up to this point. He does have some blistering underneath the draping unfortunately although this is definitely something that has been noted on other patients previously is a fairly common occurrence. Nonetheless the patient seems to be doing fairly well in general in my pinion based on what I see  at this time. I do believe these are fairly superficial and  minor. 05/07/18 on evaluation today patient's wound actually appears to be doing excellent at this point in time. He has been tolerating the Wound VAC decently well he states that it is somewhat cumbersome to carry around unfortunately. The only other issue he's been having according to family is that they been having a difficult time keeping the Wound VAC in place and doing what is supposed to do without making. Obviously I do think that this is definitely of concern. Nonetheless I do believe she's made good progress up to this point. I'm very happy in that regard. 05/14/18 on evaluation today patient's wound continues to make good progress at this point. He had a minimal amount of slough noted on the surface which was easily wiped away with saline and gauze and in general I feel like that he is continuing to show excellent progress even with the discontinuation of the Wound VAC. Overall I'm pleased in this regard. He was having issues with the Wound VAC in getting it to seal I think that using the Prisma at this time has been equally efficient and getting the wound to diminish in size. 05/29/18- He is here in follow up evaluation for a sacral ulcer. There is improvement, we will continue with prisma and he will follow up in two weeks [name], [name] ([id number]) 06/11/18 on evaluation today patient had unfortunately bright green drainage on the dressing upon evaluation today. This is something that we have encountered before although we were able to get things under control previously with antibiotics. With that being said currently upon further inspection of the three areas of hyper granulation that were separate from the actual wound itself which was almost healed it really appears that these all have some depth to them. They are more tunnels that really have not closed or at least have reopened as a result likely of infection in my pinion. This is definitely not what I was expecting or hoping  for. 06/26/18 on evaluation today patient continues to experience issues with what appears to be small abscesses in the sacral region unfortunately. With that being said he has been tolerating the dressing changes without complication which is good news. He's not having any significant discomfort which is also good news. With that being said his daughter states that after he left last week that the packing that we have placed fell out quite rapidly and he subsequently healed over very quick to the point they were not able to even repack the regions. Nonetheless there appear to be several fluctuance areas noted at this point there's one central region that does seem to be draining still discharge that is somewhat green in color. I did review the results of the wound culture which did show evidence of infection with both MRSA as well as pseudomonas based on that result. Nonetheless again with his other current medications we are not able to do the Cipro due to issues with potential long QT syndrome. I am going to give him a prescription for doxycycline in order to help with the potential MRSA infection. 07/09/18 on evaluation today patient actually appears to be doing about the same in regard to his sacral wound. He actually has his MRI scheduled for tomorrow and then subsequently is going to be having his infectious disease appointment for Thursday of this week. Fortunately he's not having any significant discomfort he still has your theme in the sacral region he still  has several blister/flux went areas although they technically are not blisters this is more like a underlying abscess. The one area that is open still does probe down to bone. Again I am concerned about a deeper infection possibly even sacral osteomyelitis. 07/23/18 on evaluation today patient actually appears to be doing very well all things considered in regard to his sacral ulcer. Since I've last seen him he actually did have his MRI  performed which showed that he has a complex fluid collection superficial to the distal sacrum measuring 5.9 x 4.4 x 2.8 cm which abuts the posterior aspect of the distal sacrum and the sacrum itself shows cortical destruction consistent with osteomyelitis. She has also been seen by infectious disease and currently orders have been initiated for IV antibiotic therapy for the next eight weeks. He was seen by Janene Madeira NP and placed on Ceftazidime and Daptomycin. Currently he has not really been on this quite long enough to see a sufficient response to the new orders as far as antibiotic therapy is concerned. Nonetheless it does appear that he is likely on the right track at this point which is great news. Nonetheless the question which both Colletta Maryland and myself have discussed both with the patient and between ourselves is whether or not the patient may need to be seen by surgeon for surgical evacuation of the fluid collection/abscess and possible debridement of the sacrum itself. Nonetheless at this time my personal opinion is probably gonna be that we wait and get this at least a couple weeks to see the response that he receives with the IV antibiotic therapy. 08/06/18 on evaluation today patient actually appears to be doing rather well in regard to his sacral ulcer region all things considered. He has been tolerating the dressing changes without complication. With that being said there is not any obvious opening nor any drainage noted at this point in time upon evaluation. The patient has been tolerating the dressing changes though. He's doing well with the IV antibiotic therapy. 08/20/18 on evaluation today patient appears to be doing wonderful in regard to his sacral region. In fact there appears to be no wound opening or drainage at this time. Overall I'm very pleased with how things have progressed. The patient likewise as well as his wife and daughter are very happy as  well. Readmission: 11/20/18 on evaluation today patient presents for reevaluation our clinic concerning issues with his sacral region. Unfortunately the area in question is the same region which we previously treated what we thought would successfully back in November 2019. At that time the patient underwent IV antibiotic therapy which seemed to do the job very well. Subsequently however in the past couple of weeks he has begun to have drainage and bleeding from the sacral region and is having increased pain yet again. Fortunately there is no signs of systemic infection although it does appear that the complex abscess that was previously noted may not have fully cleared there was a question between myself as well is infectious disease previous whether not he needed to see a surgeon being that things got better the family opted not to see a surgeon at that time. Nonetheless I am concerned that he may the need to see a surgeon in order to have this area surgically debrided and possibly a bone culture obtained in order to get a better idea of what we're treating and ensure that this is able to completely and fully heal. 11/27/18 on evaluation today patient appears to  be doing about the same in regard to his sacral ulcer. He did see Dr. Viviano Simas, [name] ([id number]) yesterday and the plan is to proceed forward with surgery to clean out the region. This seems to be the most appropriate goal of treatment at this point. Dr. Lysle Pearl just needed to speak with Dr. Ellene Route the patient's neurologist in order to get clearance as the patient was supposed to be scheduled for a carpal tunnel and elbow surgery with Dr. Ellene Route. Nonetheless he has apparently given clearance to proceed with this surgery for the sacral region which is deemed more important at this point. Unfortunately the patient had a little bit of increased pain although he is having redness at this point there does not appear to be any spreading  infection which is good news. No fevers, chills, nausea, or vomiting noted at this time. 12/03/18 on evaluation today patient actually appears to be doing about the same in regard to the sacral ulcer. He still waiting to hear back from Dr. Ines Bloomer office in regard to scheduling his surgery. Fortunately there's no signs of systemic infection at this time which is good news. Unfortunately he really does not seem to making any progress the doxycycline does seem to at least be beneficial in helping to hold off the infection from worsening although unfortunately he still has a lot going on in this regard. Patient's daughter states she's actually gonna contact her office tomorrow to see what exactly is going on. 12/10/18 on evaluation today patient actually appears to be doing about the same in regard to her sacral room. Apparently he still waiting on approval from both cardiology as well as Dr. Ellene Route although apparently he's Artie gotten a formal letter from Dr. Ellene Route stated that he was clear to proceed with the sacral surgery. Di Kindle actually found the clearance letter from cardiology in epic as well today and this subsequently was given to the patient's daughter as that seems to be the only thing that was holding up proceeding with the surgery. Hopefully should be able to take that over to the surgeon's office today in order to go ahead and get this scheduled. 12/24/18 on evaluation today patient actually appears to be doing very well in regard to his sacral ulcer compared to when I last saw him. He has had surgery Dr. Lysle Pearl and it does appear that he was able to clear out this area of abscess very well without complication. This did extend down to the bone and a portion of the bone was sent for pathology and culture. Although on the pathology report it appears this is more tissue and not bone noted. With that being said the culture from the bone and Henrene Pastor asked him this was obtained revealed  Corynebacterium striatum with no anaerobes isolated. Dr. Lysle Pearl did not place the patient on the antibiotics at this point. Again I have been debating on whether or not this is the patient to infectious disease I think that I am going to do that at this point to gain their opinion on whether or not there's anything we should be treated in this regard and if so will be the best treatment options. 12/31/18 on evaluation today patient actually appears to be doing okay in regard to his sacral wound. Fortunately there does not appear to be any evidence of infection at this time which is good news. He has been tolerating the dressing's without complication. With that being said he did see infectious disease and apparently they realize  that the pathology samples sent from the operating room was actually. I'll see him and not bone and the question is whether the culture and sample or just contamination from the skin of not guilty and active infection. The daughter is very concerned about this she states that we may need to get a piece of bone to send for examination to ensure there's nothing more severe going on here. Obviously with this history of healing and then obsesses forming and then having to go for surgery now and reopening everything they have a reason to be concerned I completely agree. 01/07/19 on evaluation today patient actually appears to be doing well in regard to his sacral wound. Again we did get results back of his bone culture as well as the pathology which showed no evidence of osteomyelitis at this point this is excellent news. Overall I think that he likely does not need to go forward with the MRI since everything seems to be showing negative at this time. Especially in light of the fact that the wound appears to be doing well and that the infection and everything was running the wound bed seems to be dramatically improved. Patient's daughter is in agreement with this plan. 01/14/19 on  evaluation today patient appears to be doing rather well at this time as far as the overall appearance of the sacral wound is concerned. The depth is slightly improved he has some granulation tissue noted there still is bone in the very bottom of the wound bed although I'm no longer able to palpate this with my finger I can only tell by probing with the sterile cotton swab. Nonetheless I do believe this is shown signs of good improvement in healing as far as what I'm seeing at this point. 01/22/19 on evaluation today patient's wound in the sacral region appears to be doing rather well. Fortunately there does not seem to be any evidence of active infection at this time which is good news but patient overall has been doing excellent currently. I'm very pleased with how things have progressed over the past week. Patient History Information obtained from Patient. Family History Cancer - Paternal Grandparents, Diabetes - Father, Heart Disease - Mother,Father, Stroke - Father, No family history of Hypertension, Kidney Disease, Lung Disease, Seizures, Thyroid Problems, Tuberculosis. PINCHAS, REITHER ([id number]) Social History Never smoker, Marital Status - Widowed, Alcohol Use - Never, Drug Use - No History, Caffeine Use - Daily. Medical History Eyes Patient has history of Cataracts - bilateral removal Denies history of Glaucoma, Optic Neuritis Ear/Nose/Mouth/Throat Denies history of Chronic sinus problems/congestion, Middle ear problems Hematologic/Lymphatic Denies history of Anemia, Hemophilia, Human Immunodeficiency Virus, Lymphedema, Sickle Cell Disease Respiratory Patient has history of Asthma Denies history of Aspiration, Chronic Obstructive Pulmonary Disease (COPD), Pneumothorax, Sleep Apnea, Tuberculosis Cardiovascular Patient has history of Angina, Arrhythmia, Coronary Artery Disease, Hypertension, Myocardial Infarction - 2001 Denies history of Congestive Heart Failure, Deep Vein  Thrombosis, Hypotension, Peripheral Arterial Disease, Peripheral Venous Disease, Phlebitis, Vasculitis Gastrointestinal Denies history of Cirrhosis , Colitis, Crohn s, Hepatitis A, Hepatitis B, Hepatitis C Endocrine Denies history of Type I Diabetes, Type II Diabetes Genitourinary Denies history of End Stage Renal Disease Immunological Denies history of Lupus Erythematosus, Raynaud s, Scleroderma Integumentary (Skin) Denies history of History of Burn, History of pressure wounds Musculoskeletal Patient has history of Osteoarthritis Denies history of Gout, Rheumatoid Arthritis, Osteomyelitis Neurologic Patient has history of Dementia, Neuropathy Denies history of Quadriplegia, Paraplegia, Seizure Disorder Oncologic Denies history of Received Chemotherapy, Received Radiation  Psychiatric Denies history of Anorexia/bulimia, Confinement Anxiety Hospitalization/Surgery History - Fall. Medical And Surgical History Notes Endocrine Borderline Oncologic Melanoma on back Review of Systems (ROS) Constitutional Symptoms (General Health) Denies complaints or symptoms of Fatigue, Fever, Chills, Marked Weight Change. Respiratory Denies complaints or symptoms of Chronic or frequent coughs, Shortness of Breath. Cardiovascular Denies complaints or symptoms of Chest pain, LE edema. Psychiatric Denies complaints or symptoms of Anxiety, Claustrophobia. [name], [name] ([id number]) Objective Constitutional Well-nourished and well-hydrated in no acute distress. Vitals Time Taken: 12:35 PM, Height: 69 in, Weight: 170.6 lbs, BMI: 25.2, Temperature: 98.5 F, Pulse: 61 bpm, Respiratory Rate: 18 breaths/min, Blood Pressure: 146/61 mmHg. Respiratory normal breathing without difficulty. Psychiatric this patient is able to make decisions and demonstrates good insight into disease process. Alert and Oriented x 3. pleasant and cooperative. General Notes: Upon inspection today patient's wound bed  showed signs of good granulation currently he did have some Slough noted on the surface of the wound which required sharp debridement today this was performed without complication post debridement the wound bed appears to be doing significantly better which is excellent news. Overall very pleased with the appearance of the wound bed at this point. Integumentary (Hair, Skin) Wound #2 status is Open. Original cause of wound was Gradually Appeared. The wound is located on the Midline Sacrum. The wound measures 2.5cm length x 1.7cm width x 0.8cm depth; 3.338cm^2 area and 2.67cm^3 volume. There is bone and Fat Layer (Subcutaneous Tissue) Exposed exposed. There is no tunneling or undermining noted. There is a medium amount of purulent drainage noted. The wound margin is indistinct and nonvisible. There is small (1-33%) pale, hyper - granulation within the wound bed. There is a medium (34-66%) amount of necrotic tissue within the wound bed including Adherent Slough. The periwound skin appearance exhibited: Erythema. The periwound skin appearance did not exhibit: Callus, Crepitus, Excoriation, Induration, Rash, Scarring, Dry/Scaly, Maceration, Atrophie Blanche, Cyanosis, Ecchymosis, Hemosiderin Staining, Mottled, Pallor, Rubor. The surrounding wound skin color is noted with erythema which is circumferential. Periwound temperature was noted as No Abnormality. The periwound has tenderness on palpation. Assessment Active Problems ICD-10 Pressure ulcer of sacral region, stage 4 Cutaneous abscess of buttock Procedures Wound #2 [name], [name] ([id number]) Pre-procedure diagnosis of Wound #2 is a Pressure Ulcer located on the Midline Sacrum . There was a Excisional Skin/Subcutaneous Tissue Debridement with a total area of 4.25 sq cm performed by STONE III, HOYT E., PA-C. With the following instrument(s): Curette to remove Viable and Non-Viable tissue/material. Material removed includes  Subcutaneous Tissue and Slough and after achieving pain control using Lidocaine 4% Topical Solution. No specimens were taken. A time out was conducted at 12:55, prior to the start of the procedure. A Minimum amount of bleeding was controlled with Pressure. The procedure was tolerated well with a pain level of 0 throughout and a pain level of 0 following the procedure. Post Debridement Measurements: 2.5cm length x 1.7cm width x 0.9cm depth; 3.004cm^3 volume. Post debridement Stage noted as Category/Stage IV. Character of Wound/Ulcer Post Debridement is improved. Post procedure Diagnosis Wound #2: Same as Pre-Procedure Plan Wound Cleansing: Wound #2 Midline Sacrum: Clean wound with Normal Saline. May Shower, gently pat wound dry prior to applying new dressing. Anesthetic (add to Medication List): Wound #2 Midline Sacrum: Topical Lidocaine 4% cream applied to wound bed prior to debridement (In Clinic Only). Primary Wound Dressing: Wound #2 Midline Sacrum: Silver Alginate Collagen - to wound bed Secondary Dressing: Wound #2 Midline Sacrum: Boardered Foam Dressing  Dressing Change Frequency: Wound #2 Midline Sacrum: Change dressing every other day. Follow-up Appointments: Wound #2 Midline Sacrum: Return Appointment in 1 week. Off-Loading: Wound #2 Midline Sacrum: Turn and reposition every 2 hours I'm gonna suggest that we continue with the above wound care measures for the next week and the patient is in agreement with that plan. If anything changes or worsens in the meantime he will contact the office let me know the one change were making is adding collagen to the base of the wound prior to placing the alginate over top. Hopefully this is gonna help allow the area to heal more effectively and quickly. Patient is in agreement that plan. Please see above for specific wound care orders. We will see patient for re-evaluation in 1 week(s) here in the clinic. If anything worsens or changes  patient will contact our office for additional recommendations. Electronic Signature(s) Signed: 01/22/2019 5:10:39 PM By: Worthy Keeler PA-C Entered By: Worthy Keeler on 01/22/2019 13:07:35 [name], [name] ([id number], [name] ([id number]) -------------------------------------------------------------------------------- ROS/PFSH Details Patient Name: CHANEL, MCKESSON Date of Service: 01/22/2019 12:30 PM Medical Record Number: [id number] Patient Account Number: [id number] Date of Birth/Sex: 12-16-1930 (85 y.o. M) Treating RN: Montey Hora Primary Care Provider: Burman Freestone Other Clinician: Referring Provider: Burman Freestone Treating Provider/Extender: [name], HOYT Weeks in Treatment: 9 Information Obtained From Patient Constitutional Symptoms (General Health) Complaints and Symptoms: Negative for: Fatigue; Fever; Chills; Marked Weight Change Respiratory Complaints and Symptoms: Negative for: Chronic or frequent coughs; Shortness of Breath Medical History: Positive for: Asthma Negative for: Aspiration; Chronic Obstructive Pulmonary Disease (COPD); Pneumothorax; Sleep Apnea; Tuberculosis Cardiovascular Complaints and Symptoms: Negative for: Chest pain; LE edema Medical History: Positive for: Angina; Arrhythmia; Coronary Artery Disease; Hypertension; Myocardial Infarction - 2001 Negative for: Congestive Heart Failure; Deep Vein Thrombosis; Hypotension; Peripheral Arterial Disease; Peripheral Venous Disease; Phlebitis; Vasculitis Psychiatric Complaints and Symptoms: Negative for: Anxiety; Claustrophobia Medical History: Negative for: Anorexia/bulimia; Confinement Anxiety Eyes Medical History: Positive for: Cataracts - bilateral removal Negative for: Glaucoma; Optic Neuritis Ear/Nose/Mouth/Throat Medical History: Negative for: Chronic sinus problems/congestion; Middle ear problems Hematologic/Lymphatic Medical History: Negative for: Anemia; Hemophilia;  Human Immunodeficiency Virus; Lymphedema; Sickle Cell Disease EMRE, STOCK ([id number]) Gastrointestinal Medical History: Negative for: Cirrhosis ; Colitis; Crohnos; Hepatitis A; Hepatitis B; Hepatitis C Endocrine Medical History: Negative for: Type I Diabetes; Type II Diabetes Past Medical History Notes: Borderline Genitourinary Medical History: Negative for: End Stage Renal Disease Immunological Medical History: Negative for: Lupus Erythematosus; Raynaudos; Scleroderma Integumentary (Skin) Medical History: Negative for: History of Burn; History of pressure wounds Musculoskeletal Medical History: Positive for: Osteoarthritis Negative for: Gout; Rheumatoid Arthritis; Osteomyelitis Neurologic Medical History: Positive for: Dementia; Neuropathy Negative for: Quadriplegia; Paraplegia; Seizure Disorder Oncologic Medical History: Negative for: Received Chemotherapy; Received Radiation Past Medical History Notes: Melanoma on back HBO Extended History Items Eyes: Cataracts Immunizations Pneumococcal Vaccine: Received Pneumococcal Vaccination: Yes Implantable Devices No devices added Hospitalization / Surgery History Type of Hospitalization/Surgery FINIS, HENDRICKSEN ([id number]) Fall Family and Social History Cancer: Yes - Paternal Grandparents; Diabetes: Yes - Father; Heart Disease: Yes - Mother,Father; Hypertension: No; Kidney Disease: No; Lung Disease: No; Seizures: No; Stroke: Yes - Father; Thyroid Problems: No; Tuberculosis: No; Never smoker; Marital Status - Widowed; Alcohol Use: Never; Drug Use: No History; Caffeine Use: Daily; Financial Concerns: No; Food, Clothing or Shelter Needs: No; Support System Lacking: No; Transportation Concerns: No Physician Affirmation I have reviewed and agree with the above information. Electronic Signature(s) Signed: 01/22/2019 2:58:29 PM By:  Montey Hora Signed: 01/22/2019 5:10:39 PM By: Worthy Keeler PA-C Entered By: Worthy Keeler on 01/22/2019 13:05:42 [name] ([id number]) -------------------------------------------------------------------------------- SuperBill Details Patient Name: [name] Date of Service: 01/22/2019 Medical Record Number: [id number] Patient Account Number: [id number] Date of Birth/Sex: 07/16/1931 (85 y.o. M) Treating RN: Montey Hora Primary Care Provider: Burman Freestone Other Clinician: Referring Provider: Burman Freestone Treating Provider/Extender: [name], HOYT Weeks in Treatment: 9 Diagnosis Coding ICD-10 Codes Code Description L89.154 Pressure ulcer of sacral region, stage 4 L02.31 Cutaneous abscess of buttock Facility Procedures CPT4 Code: [id number] Description: 24825 - WOUND CARE VISIT-LEV 3 EST PT Modifier: Quantity: 1 CPT4 Code: [id number] Description: 89169 - DEB SUBQ TISSUE 20 SQ CM/< ICD-10 Diagnosis Description L89.154 Pressure ulcer of sacral region, stage 4 Modifier: Quantity: 1 Physician Procedures CPT4 Code: [id number] Description: 28003 - WC PHYS SUBQ TISS 20 SQ CM ICD-10 Diagnosis Description L89.154 Pressure ulcer of sacral region, stage 4 Modifier: Quantity: 1 Electronic Signature(s) Signed: 01/22/2019 5:10:39 PM By: Worthy Keeler PA-C Entered By: Worthy Keeler on 01/22/2019 13:07:41

## 2019-01-28 ENCOUNTER — Encounter: Payer: Medicare Other | Admitting: Physician Assistant

## 2019-01-28 ENCOUNTER — Other Ambulatory Visit: Payer: Self-pay

## 2019-01-28 DIAGNOSIS — L89154 Pressure ulcer of sacral region, stage 4: Secondary | ICD-10-CM | POA: Diagnosis not present

## 2019-01-28 NOTE — Progress Notes (Signed)
AMORY, SIMONETTI ([id number]) Visit Report for 01/22/2019 Arrival Information Details Patient Name: [name], [name]. Date of Service: 01/22/2019 12:30 PM Medical Record Number: [id number] Patient Account Number: [id number] Date of Birth/Sex: 03-01-1931 (85 y.o. M) Treating RN: Harold Barban Primary Care Jeniece Hannis: Burman Freestone Other Clinician: Referring Aleks Nawrot: Burman Freestone Treating Williette Loewe/Extender: [name], HOYT Weeks in Treatment: 9 Visit Information History Since Last Visit Added or deleted any medications: No Patient Arrived: Cane Any new allergies or adverse reactions: No Arrival Time: 12:40 Had a fall or experienced change in No Accompanied By: daughter activities of daily living that may affect Transfer Assistance: None risk of falls: Patient Identification Verified: Yes Signs or symptoms of abuse/neglect since last visito No Secondary Verification Process Yes Hospitalized since last visit: No Completed: Has Dressing in Place as Prescribed: Yes Patient Has Alerts: Yes Pain Present Now: No Patient Alerts: Patient on Blood Thinner Electronic Signature(s) Signed: 01/28/2019 8:41:29 AM By: Harold Barban Entered By: Harold Barban on 01/22/2019 12:42:04 [name] ([id number]) -------------------------------------------------------------------------------- Clinic Level of Care Assessment Details Patient Name: [name] Date of Service: 01/22/2019 12:30 PM Medical Record Number: [id number] Patient Account Number: [id number] Date of Birth/Sex: 1931/04/08 (85 y.o. M) Treating RN: Montey Hora Primary Care Reno Clasby: Burman Freestone Other Clinician: Referring Collis Thede: Burman Freestone Treating Milanya Sunderland/Extender: [name], HOYT Weeks in Treatment: 9 Clinic Level of Care Assessment Items TOOL 4 Quantity Score []  - Use when only an EandM is performed on FOLLOW-UP visit 0 ASSESSMENTS - Nursing Assessment / Reassessment X - Reassessment of  Co-morbidities (includes updates in patient status) 1 10 X- 1 5 Reassessment of Adherence to Treatment Plan ASSESSMENTS - Wound and Skin Assessment / Reassessment X - Simple Wound Assessment / Reassessment - one wound 1 5 []  - 0 Complex Wound Assessment / Reassessment - multiple wounds []  - 0 Dermatologic / Skin Assessment (not related to wound area) ASSESSMENTS - Focused Assessment []  - Circumferential Edema Measurements - multi extremities 0 []  - 0 Nutritional Assessment / Counseling / Intervention []  - 0 Lower Extremity Assessment (monofilament, tuning fork, pulses) []  - 0 Peripheral Arterial Disease Assessment (using hand held doppler) ASSESSMENTS - Ostomy and/or Continence Assessment and Care []  - Incontinence Assessment and Management 0 []  - 0 Ostomy Care Assessment and Management (repouching, etc.) PROCESS - Coordination of Care X - Simple Patient / Family Education for ongoing care 1 15 []  - 0 Complex (extensive) Patient / Family Education for ongoing care X- 1 10 Staff obtains Programmer, systems, Records, Test Results / Process Orders []  - 0 Staff telephones HHA, Nursing Homes / Clarify orders / etc []  - 0 Routine Transfer to another Facility (non-emergent condition) []  - 0 Routine Hospital Admission (non-emergent condition) []  - 0 New Admissions / Biomedical engineer / Ordering NPWT, Apligraf, etc. []  - 0 Emergency Hospital Admission (emergent condition) X- 1 10 Simple Discharge Coordination DRESHON, PROFFIT ([id number]) []  - 0 Complex (extensive) Discharge Coordination PROCESS - Special Needs []  - Pediatric / Minor Patient Management 0 []  - 0 Isolation Patient Management []  - 0 Hearing / Language / Visual special needs []  - 0 Assessment of Community assistance (transportation, D/C planning, etc.) []  - 0 Additional assistance / Altered mentation []  - 0 Support Surface(s) Assessment (bed, cushion, seat, etc.) INTERVENTIONS - Wound Cleansing / Measurement X -  Simple Wound Cleansing - one wound 1 5 []  - 0 Complex Wound Cleansing - multiple wounds X- 1 5 Wound Imaging (photographs - any number of wounds) []  - 0  Wound Tracing (instead of photographs) X- 1 5 Simple Wound Measurement - one wound []  - 0 Complex Wound Measurement - multiple wounds INTERVENTIONS - Wound Dressings X - Small Wound Dressing one or multiple wounds 1 10 []  - 0 Medium Wound Dressing one or multiple wounds []  - 0 Large Wound Dressing one or multiple wounds []  - 0 Application of Medications - topical []  - 0 Application of Medications - injection INTERVENTIONS - Miscellaneous []  - External ear exam 0 []  - 0 Specimen Collection (cultures, biopsies, blood, body fluids, etc.) []  - 0 Specimen(s) / Culture(s) sent or taken to Lab for analysis []  - 0 Patient Transfer (multiple staff / Civil Service fast streamer / Similar devices) []  - 0 Simple Staple / Suture removal (25 or less) []  - 0 Complex Staple / Suture removal (26 or more) []  - 0 Hypo / Hyperglycemic Management (close monitor of Blood Glucose) []  - 0 Ankle / Brachial Index (ABI) - do not check if billed separately X- 1 5 Vital Signs CAIDEN, [name] ([id number]) Has the patient been seen at the hospital within the last three years: Yes Total Score: 85 Level Of Care: New/Established - Level 3 Electronic Signature(s) Signed: 01/22/2019 2:58:29 PM By: Montey Hora Entered By: Montey Hora on 01/22/2019 13:01:27 [name] ([id number]) -------------------------------------------------------------------------------- Encounter Discharge Information Details Patient Name: [name] Date of Service: 01/22/2019 12:30 PM Medical Record Number: [id number] Patient Account Number: [id number] Date of Birth/Sex: [birth year] (85 y.o. M) Treating RN: Cornell Barman Primary Care Tylia Ewell: Burman Freestone Other Clinician: Referring Keriann Rankin: Burman Freestone Treating Kaisen Ackers/Extender: [name], HOYT Weeks in Treatment:  9 Encounter Discharge Information Items Post Procedure Vitals Discharge Condition: Stable Temperature (F): 98.5 Ambulatory Status: Ambulatory Pulse (bpm): 68 Discharge Destination: Home Respiratory Rate (breaths/min): 18 Transportation: Private Auto Blood Pressure (mmHg): 146/61 Accompanied By: self Schedule Follow-up Appointment: Yes Clinical Summary of Care: Electronic Signature(s) Signed: 01/22/2019 4:41:32 PM By: Gretta Cool, BSN, RN, CWS, Kim RN, BSN Entered By: Gretta Cool, BSN, RN, CWS, Kim on 01/22/2019 13:52:48 [name] ([id number]) -------------------------------------------------------------------------------- Lower Extremity Assessment Details Patient Name: KENTON, FORTIN. Date of Service: 01/22/2019 12:30 PM Medical Record Number: [id number] Patient Account Number: [id number] Date of Birth/Sex: 09-12-1931 (85 y.o. M) Treating RN: Harold Barban Primary Care Aysen Shieh: Burman Freestone Other Clinician: Referring Cataldo Cosgriff: Burman Freestone Treating Adelynn Gipe/Extender: [name], HOYT Weeks in Treatment: 9 Electronic Signature(s) Signed: 01/28/2019 8:41:29 AM By: Harold Barban Entered By: Harold Barban on 01/22/2019 12:48:10 [name] ([id number]) -------------------------------------------------------------------------------- Multi Wound Chart Details Patient Name: [name] Date of Service: 01/22/2019 12:30 PM Medical Record Number: [id number] Patient Account Number: [id number] Date of Birth/Sex: [birth year] (85 y.o. M) Treating RN: Montey Hora Primary Care Davona Kinoshita: Burman Freestone Other Clinician: Referring Fernando Stoiber: Burman Freestone Treating Ambry Dix/Extender: [name], HOYT Weeks in Treatment: 9 Vital Signs Height(in): 69 Pulse(bpm): 61 Weight(lbs): 170.6 Blood Pressure(mmHg): 146/61 Body Mass Index(BMI): 25 Temperature(F): 98.5 Respiratory Rate 18 (breaths/min): Photos: [N/A:N/A] Wound Location: Sacrum - Midline N/A N/A Wounding  Event: Gradually Appeared N/A N/A Primary Etiology: Pressure Ulcer N/A N/A Secondary Etiology: Abscess N/A N/A Comorbid History: Cataracts, Asthma, Angina, N/A N/A Arrhythmia, Coronary Artery Disease, Hypertension, Myocardial Infarction, Osteoarthritis, Dementia, Neuropathy Date Acquired: 10/30/2018 N/A N/A Weeks of Treatment: 9 N/A N/A Wound Status: Open N/A N/A Measurements L x W x D 2.5x1.7x0.8 N/A N/A (cm) Area (cm) : 3.338 N/A N/A Volume (cm) : 2.67 N/A N/A % Reduction in Area: -41625.00% N/A N/A % Reduction in Volume: -13250.00% N/A N/A Classification: Category/Stage IV  N/A N/A Exudate Amount: Medium N/A N/A Exudate Type: Purulent N/A N/A Exudate Color: yellow, brown, green N/A N/A Wound Margin: Indistinct, nonvisible N/A N/A Granulation Amount: Small (1-33%) N/A N/A Granulation Quality: Pale, Hyper-granulation N/A N/A Necrotic Amount: Medium (34-66%) N/A N/A Exposed Structures: Fat Layer (Subcutaneous N/A N/A Tissue) Exposed: Yes ESTANISLAO, HARMON ([id number]) Bone: Yes Fascia: No Tendon: No Muscle: No Joint: No Epithelialization: Medium (34-66%) N/A N/A Periwound Skin Texture: Excoriation: No N/A N/A Induration: No Callus: No Crepitus: No Rash: No Scarring: No Periwound Skin Moisture: Maceration: No N/A N/A Dry/Scaly: No Periwound Skin Color: Erythema: Yes N/A N/A Atrophie Blanche: No Cyanosis: No Ecchymosis: No Hemosiderin Staining: No Mottled: No Pallor: No Rubor: No Erythema Location: Circumferential N/A N/A Temperature: No Abnormality N/A N/A Tenderness on Palpation: Yes N/A N/A Treatment Notes Electronic Signature(s) Signed: 01/22/2019 2:58:29 PM By: Montey Hora Entered By: Montey Hora on 01/22/2019 12:54:02 [name] ([id number]) -------------------------------------------------------------------------------- Camilla Details Patient Name: [name] Date of Service: 01/22/2019 12:30 PM Medical  Record Number: [id number] Patient Account Number: [id number] Date of Birth/Sex: 03/17/1931 (85 y.o. M) Treating RN: Montey Hora Primary Care Gissella Niblack: Burman Freestone Other Clinician: Referring Chaniah Cisse: Burman Freestone Treating Winter Trefz/Extender: [name], HOYT Weeks in Treatment: 9 Active Inactive Abuse / Safety / Falls / Self Care Management Nursing Diagnoses: Potential for falls Goals: Patient will not experience any injury related to falls Date Initiated: 11/20/2018 Target Resolution Date: 02/15/2019 Goal Status: Active Interventions: Assess fall risk on admission and as needed Notes: Orientation to the Wound Care Program Nursing Diagnoses: Knowledge deficit related to the wound healing center program Goals: Patient/caregiver will verbalize understanding of the [place] Program Date Initiated: 11/20/2018 Target Resolution Date: 02/15/2019 Goal Status: Active Interventions: Provide education on orientation to the wound center Notes: Wound/Skin Impairment Nursing Diagnoses: Impaired tissue integrity Goals: Ulcer/skin breakdown will heal within 14 weeks Date Initiated: 11/20/2018 Target Resolution Date: 02/15/2019 Goal Status: Active Interventions: Assess patient/caregiver ability to obtain necessary supplies LAKSH, HINNERS ([id number]) Assess patient/caregiver ability to perform ulcer/skin care regimen upon admission and as needed Assess ulceration(s) every visit Notes: Electronic Signature(s) Signed: 01/22/2019 2:58:29 PM By: Montey Hora Entered By: Montey Hora on 01/22/2019 12:53:53 [name] ([id number]) -------------------------------------------------------------------------------- Pain Assessment Details Patient Name: [name] Date of Service: 01/22/2019 12:30 PM Medical Record Number: [id number] Patient Account Number: [id number] Date of Birth/Sex: May 16, 1931 (85 y.o. M) Treating RN: Harold Barban Primary Care Tramon Crescenzo: Burman Freestone Other Clinician: Referring Maude Hettich: Burman Freestone Treating Harvir Patry/Extender: [name], HOYT Weeks in Treatment: 9 Active Problems Location of Pain Severity and Description of Pain Patient Has Paino No Site Locations Pain Management and Medication Current Pain Management: Electronic Signature(s) Signed: 01/28/2019 8:41:29 AM By: Harold Barban Entered By: Harold Barban on 01/22/2019 12:42:11 [name] ([id number]) -------------------------------------------------------------------------------- Patient/Caregiver Education Details Patient Name: [name] Date of Service: 01/22/2019 12:30 PM Medical Record Number: [id number] Patient Account Number: [id number] Date of Birth/Gender: 09/25/1931 (85 y.o. M) Treating RN: Montey Hora Primary Care Physician: Burman Freestone Other Clinician: Referring Physician: Burman Freestone Treating Physician/Extender: Sharalyn Ink in Treatment: 9 Education Assessment Education Provided To: Patient Education Topics Provided Wound/Skin Impairment: Handouts: Other: wound care as ordered Methods: Demonstration, Explain/Verbal Responses: State content correctly Electronic Signature(s) Signed: 01/22/2019 2:58:29 PM By: Montey Hora Entered By: Montey Hora on 01/22/2019 13:01:44 [name] ([id number]) -------------------------------------------------------------------------------- Wound Assessment Details Patient Name: [name] Date of Service: 01/22/2019 12:30 PM Medical Record Number: [id number] Patient  Account Number: [id number] Date of Birth/Sex: May 29, 1931 (85 y.o. M) Treating RN: Harold Barban Primary Care Jerriyah Louis: Burman Freestone Other Clinician: Referring Khelani Kops: Burman Freestone Treating Aubery Douthat/Extender: [name], HOYT Weeks in Treatment: 9 Wound Status Wound Number: 2 Primary Pressure Ulcer Etiology: Wound Location: Sacrum - Midline Secondary Abscess Wounding Event:  Gradually Appeared Etiology: Date Acquired: 10/30/2018 Wound Open Weeks Of Treatment: 9 Status: Clustered Wound: No Comorbid Cataracts, Asthma, Angina, Arrhythmia, History: Coronary Artery Disease, Hypertension, Myocardial Infarction, Osteoarthritis, Dementia, Neuropathy Photos Wound Measurements Length: (cm) 2.5 Width: (cm) 1.7 Depth: (cm) 0.8 Area: (cm) 3.338 Volume: (cm) 2.67 % Reduction in Area: -41625% % Reduction in Volume: -13250% Epithelialization: Medium (34-66%) Tunneling: No Undermining: No Wound Description Classification: Category/Stage IV Foul Odor A Wound Margin: Indistinct, nonvisible Slough/Fibr Exudate Amount: Medium Exudate Type: Purulent Exudate Color: yellow, brown, green fter Cleansing: No ino No Wound Bed Granulation Amount: Small (1-33%) Exposed Structure Granulation Quality: Pale, Hyper-granulation Fascia Exposed: No Necrotic Amount: Medium (34-66%) Fat Layer (Subcutaneous Tissue) Exposed: Yes Necrotic Quality: Adherent Slough Tendon Exposed: No Muscle Exposed: No Joint Exposed: No Bone Exposed: Yes CHAYIM, BIALAS ([id number]) Periwound Skin Texture Texture Color No Abnormalities Noted: No No Abnormalities Noted: No Callus: No Atrophie Blanche: No Crepitus: No Cyanosis: No Excoriation: No Ecchymosis: No Induration: No Erythema: Yes Rash: No Erythema Location: Circumferential Scarring: No Hemosiderin Staining: No Mottled: No Moisture Pallor: No No Abnormalities Noted: No Rubor: No Dry / Scaly: No Maceration: No Temperature / Pain Temperature: No Abnormality Tenderness on Palpation: Yes Treatment Notes Wound #2 (Midline Sacrum) Notes pRISMA aG, silver cell, BFD Electronic Signature(s) Signed: 01/28/2019 8:41:29 AM By: Harold Barban Entered By: Harold Barban on 01/22/2019 12:47:49 [name] ([id number]) -------------------------------------------------------------------------------- Vitals  Details Patient Name: [name] Date of Service: 01/22/2019 12:30 PM Medical Record Number: [id number] Patient Account Number: [id number] Date of Birth/Sex: May 02, 1931 (85 y.o. M) Treating RN: Harold Barban Primary Care Kathryn Cosby: Burman Freestone Other Clinician: Referring Mykeria Garman: Burman Freestone Treating Niamh Rada/Extender: [name], HOYT Weeks in Treatment: 9 Vital Signs Time Taken: 12:35 Temperature (F): 98.5 Height (in): 69 Pulse (bpm): 61 Weight (lbs): 170.6 Respiratory Rate (breaths/min): 18 Body Mass Index (BMI): 25.2 Blood Pressure (mmHg): 146/61 Reference Range: 80 - 120 mg / dl Electronic Signature(s) Signed: 01/28/2019 8:41:29 AM By: Harold Barban Entered By: Harold Barban on 01/22/2019 12:42:28

## 2019-01-29 NOTE — Progress Notes (Signed)
MICKLE, CAMPTON ([id number]) Visit Report for 01/28/2019 Chief Complaint Document Details Patient Name: [name], [name]. Date of Service: 01/28/2019 12:30 PM Medical Record Number: [id number] Patient Account Number: [id number] Date of Birth/Sex: 12-Feb-1931 (85 y.o. M) Treating RN: Harold Barban Primary Care Provider: Burman Freestone Other Clinician: Referring Provider: Burman Freestone Treating Provider/Extender: [name], HOYT Weeks in Treatment: 9 Information Obtained from: Patient Chief Complaint Sacral pressure ulcer Electronic Signature(s) Signed: 01/28/2019 11:22:15 PM By: Worthy Keeler PA-C Entered By: Worthy Keeler on 01/28/2019 12:52:43 [name] ([id number]) -------------------------------------------------------------------------------- Debridement Details Patient Name: [name] Date of Service: 01/28/2019 12:30 PM Medical Record Number: [id number] Patient Account Number: [id number] Date of Birth/Sex: [birth year] (85 y.o. M) Treating RN: Harold Barban Primary Care Provider: Burman Freestone Other Clinician: Referring Provider: Burman Freestone Treating Provider/Extender: [name], HOYT Weeks in Treatment: 9 Debridement Performed for Wound #2 Midline Sacrum Assessment: Performed By: Physician STONE III, HOYT E., PA-C Debridement Type: Debridement Level of Consciousness (Pre- Awake and Alert procedure): Pre-procedure Verification/Time Yes - 12:56 Out Taken: Start Time: 12:56 Pain Control: Lidocaine Total Area Debrided (L x W): 2 (cm) x 1 (cm) = 2 (cm) Tissue and other material Viable, Non-Viable, Slough, Subcutaneous, Slough debrided: Level: Skin/Subcutaneous Tissue Debridement Description: Excisional Instrument: Curette Bleeding: Minimum Hemostasis Achieved: Pressure End Time: 12:59 Procedural Pain: 0 Post Procedural Pain: 0 Response to Treatment: Procedure was tolerated well Level of Consciousness Awake and  Alert (Post-procedure): Post Debridement Measurements of Total Wound Length: (cm) 2 Stage: Category/Stage IV Width: (cm) 1 Depth: (cm) 1 Volume: (cm) 1.571 Character of Wound/Ulcer Post Improved Debridement: Post Procedure Diagnosis Same as Pre-procedure Electronic Signature(s) Signed: 01/28/2019 2:00:35 PM By: Harold Barban Signed: 01/28/2019 11:22:15 PM By: Worthy Keeler PA-C Entered By: Harold Barban on 01/28/2019 12:57:35 [name] ([id number]) -------------------------------------------------------------------------------- HPI Details Patient Name: [name] Date of Service: 01/28/2019 12:30 PM Medical Record Number: [id number] Patient Account Number: [id number] Date of Birth/Sex: 08-09-1931 (85 y.o. M) Treating RN: Harold Barban Primary Care Provider: Burman Freestone Other Clinician: Referring Provider: Burman Freestone Treating Provider/Extender: [name], HOYT Weeks in Treatment: 9 History of Present Illness HPI Description: 02/06/18 on evaluation today patient presents for initial evaluation and our clinic concerning an issue which began roughly 3 weeks ago when the patient fell in his home on the floor in his kitchen and laid him down this detergent for roughly 3 days. He had a pressure injury to the left shoulder. This unfortunately has caused him a lot of discomfort although it finally seems to be doing better if anything is really having a lot of itching right now. This appears potentially be a contact dermatitis issue. He also has a significant pressure injury to the sacrum at this time as well which is also showing fascia exposure right over the bone but no evidence of bone exposure at this point which is good news. They have been using Santyl as well as Saline soaked gauze at this point in time. There does appear to be a lot of necrotic slough in the base of the wound. He does have a history of incontinence, myocardial infarction, and  hypertension. He also is "borderline diabetic" hemoglobin A1c of 6.0. Currently he has some discomfort in the pressure site at the sacrum but fortunately nothing too significant this did require sharp debridement today. 02/13/18 on evaluation today patient appears to be doing much better in regard to his sacral wound. He has been tolerating the dressing changes without complication with  the Vashe. Fortunately there is no evidence of infection and though there is some Slough on the surface of the wound bed he has excellent granulation noted. Overall I'm pleased with how things have progressed in that regard. A glance at his shoulder as well and the rash seems to be someone improving in my pinion at this site as well. Overall I am pleased with what we're seeing and so is the family. 02/20/18 on evaluation today patient appears to be doing a little worse in regard to the sacral wound only in the fact that there is redness surrounding it has me somewhat concerned for infection. The drainage has also apparently been a little bit off color compared to normal according to family they did keep the dressing today that was removed to show me and I agree this seems to be a little bit different compared to what we have been seeing. Coupled with the redness I'm concerned he may be developing some cellulitis surrounding the wound bed. 02/27/18 on evaluation today patient presents for follow-up concerning his sacral ulcer. We have received the results back from his wound culture which shows unfortunately that the doxycycline will not be of benefit for him I am going to need to initiate treatment with something else in order to treat the pseudomonas. Otherwise he does not seem to be having any significant pain although his daughter states there are sometimes when he states having pain. We continue to use the Vashe currently. 03/06/18 on evaluation today patient's sacral wound appears to be doing better in my opinion. He  has been tolerating the dressing changes without complication. With that being said the silver nitrate has helped with the prominent area of hyper granulation at the 6 o'clock location we will likely need to repeat this again today. Nonetheless overall I am pleased with how things have improved over the last week. The erythema surrounding the wound seems to be greatly improved. 03/13/18 on evaluation today patient's wound actually does not appear to be terribly infected although she does continue to have erythema surrounding the wound bed especially on the left border. I'm still somewhat concerned about the fact that the oral antibiotics alone may not be completely treating his infection. I previously discussed may need to go for IV antibiotic therapy I'm concerned that may be the case. We will need to make a referral today for infectious disease. 03/20/18 on evaluation today the patient sacral wound actually appears to be doing fairly well in regard to granulation although he continues unfortunately to have it your theme is surrounding the periwound region. There's also some increased swelling at the 6 o'clock location which also has me somewhat concerned. With that being said he does have some discomfort but nothing too significant at this point. He still has not heard from infectious disease his daughter and wife are both present during the office visit today they're going to check back with this again. We did get the information for them to call them today. 03/27/18 on evaluation today patient is seen concerning his ongoing sacral ulcer. He has been tolerating the dressing changes without complication. With that being said he does present with evidence of bright green drainage noted on the dressing which again is something that I do often expect to see with a pseudomonas infection. He continues to have your theme is CLARKSON, ROSSELLI ([id number]) surrounding the wound bed as well and again I'm not  100% convinced this is just pressure related. I did speak with  Colletta Maryland who is the nurse practitioner in [place] with infectious disease. I spoke with her actually yesterday concerning this patient. She is not 100% convinced that this is infected. She question whether the wound may just be colonized with Pseudomonas and not actually causing an active infection. I am really not thinking that the edema is associated with pressure alone and again overall I don't feel that your theme and is consistent with a pressure injury either as he's never had any contusions noted like a deep tissue injury on his heel which was new and I did visualize today this was on the left heel. Nonetheless she wanted to give this a little bit more time and thought it would be appropriate to start the Wound VAC at this point. 04/03/18 on evaluation today patient sacral ulcer actually appears to be doing fairly well at this point. He has been tolerating the dressing changes without complication. With that being said I'm very pleased with the progress that has been made in regard to his sacral wound over the past week I do not see as much in the way of erythema which is great news. Nonetheless he does have a small area of hyper granulation unfortunately. This is at roughly the 7 o'clock location and I think does need to be addressed so that this will heal more appropriately. Nonetheless I think we may be ready to go ahead and initiate therapy with the Wound VAC. 04/10/18 on evaluation today patient appears to be doing excellent in regard to his sacral ulcer. The show signs of great improvement in overall I'm very pleased with how things look. He has been tolerating the dressing changes without complication. Specifically this is the Wound VAC. He also seems to be doing well with the antibiotic there is decreased your theme and redness surrounding the sacral area at this point in the wound has filled in quite  significantly. 04/17/18 on evaluation today patient actually appears to be doing excellent in regard to his sacral ulcer. He's been tolerating the dressing changes without complication specifically the Wound VAC. There really are no major concerns from the patient nor family this point he is having no pain. He does have a little bit of Epiboly on the lateral portions of the wound where he does have a little bit more depth that will need to be addressed today. 04/23/18 on evaluation today patient's wound actually appears to be doing excellent at this point. He has been tolerating the Wound VAC and this appears to be doing well other than the fact that it seems to be breaking seal at the 6 o'clock location. I do believe that adding a duodenum dressing at this location try and help maintain the seal would be appropriate and likely very effective. With that being said he overall seems to be showing signs of good improvement at this point. There does not appear to be any evidence of significant infection which is also excellent news. 04/30/18 on evaluation today patient actually appears to be doing well in regard to his sacral ulcer. He's been tolerating the dressing changes without complication. Fortunately there does not appear to be any evidence of infection. Overall I'm very pleased with the progress that has been made up to this point. He does have some blistering underneath the draping unfortunately although this is definitely something that has been noted on other patients previously is a fairly common occurrence. Nonetheless the patient seems to be doing fairly well in general in my pinion based on  what I see at this time. I do believe these are fairly superficial and minor. 05/07/18 on evaluation today patient's wound actually appears to be doing excellent at this point in time. He has been tolerating the Wound VAC decently well he states that it is somewhat cumbersome to carry around unfortunately.  The only other issue he's been having according to family is that they been having a difficult time keeping the Wound VAC in place and doing what is supposed to do without making. Obviously I do think that this is definitely of concern. Nonetheless I do believe she's made good progress up to this point. I'm very happy in that regard. 05/14/18 on evaluation today patient's wound continues to make good progress at this point. He had a minimal amount of slough noted on the surface which was easily wiped away with saline and gauze and in general I feel like that he is continuing to show excellent progress even with the discontinuation of the Wound VAC. Overall I'm pleased in this regard. He was having issues with the Wound VAC in getting it to seal I think that using the Prisma at this time has been equally efficient and getting the wound to diminish in size. 05/29/18- He is here in follow up evaluation for a sacral ulcer. There is improvement, we will continue with prisma and he will follow up in two weeks 06/11/18 on evaluation today patient had unfortunately bright green drainage on the dressing upon evaluation today. This is something that we have encountered before although we were able to get things under control previously with antibiotics. With that being said currently upon further inspection of the three areas of hyper granulation that were separate from the actual wound itself which was almost healed it really appears that these all have some depth to them. They are more tunnels that really have not closed or at least have reopened as a result likely of infection in my pinion. This is definitely not what I was expecting or hoping for. DHANUSH, JOKERST ([id number]) 06/26/18 on evaluation today patient continues to experience issues with what appears to be small abscesses in the sacral region unfortunately. With that being said he has been tolerating the dressing changes without complication  which is good news. He's not having any significant discomfort which is also good news. With that being said his daughter states that after he left last week that the packing that we have placed fell out quite rapidly and he subsequently healed over very quick to the point they were not able to even repack the regions. Nonetheless there appear to be several fluctuance areas noted at this point there's one central region that does seem to be draining still discharge that is somewhat green in color. I did review the results of the wound culture which did show evidence of infection with both MRSA as well as pseudomonas based on that result. Nonetheless again with his other current medications we are not able to do the Cipro due to issues with potential long QT syndrome. I am going to give him a prescription for doxycycline in order to help with the potential MRSA infection. 07/09/18 on evaluation today patient actually appears to be doing about the same in regard to his sacral wound. He actually has his MRI scheduled for tomorrow and then subsequently is going to be having his infectious disease appointment for Thursday of this week. Fortunately he's not having any significant discomfort he still has your theme in the sacral  region he still has several blister/flux went areas although they technically are not blisters this is more like a underlying abscess. The one area that is open still does probe down to bone. Again I am concerned about a deeper infection possibly even sacral osteomyelitis. 07/23/18 on evaluation today patient actually appears to be doing very well all things considered in regard to his sacral ulcer. Since I've last seen him he actually did have his MRI performed which showed that he has a complex fluid collection superficial to the distal sacrum measuring 5.9 x 4.4 x 2.8 cm which abuts the posterior aspect of the distal sacrum and the sacrum itself shows cortical destruction  consistent with osteomyelitis. She has also been seen by infectious disease and currently orders have been initiated for IV antibiotic therapy for the next eight weeks. He was seen by Janene Madeira NP and placed on Ceftazidime and Daptomycin. Currently he has not really been on this quite long enough to see a sufficient response to the new orders as far as antibiotic therapy is concerned. Nonetheless it does appear that he is likely on the right track at this point which is great news. Nonetheless the question which both Colletta Maryland and myself have discussed both with the patient and between ourselves is whether or not the patient may need to be seen by surgeon for surgical evacuation of the fluid collection/abscess and possible debridement of the sacrum itself. Nonetheless at this time my personal opinion is probably gonna be that we wait and get this at least a couple weeks to see the response that he receives with the IV antibiotic therapy. 08/06/18 on evaluation today patient actually appears to be doing rather well in regard to his sacral ulcer region all things considered. He has been tolerating the dressing changes without complication. With that being said there is not any obvious opening nor any drainage noted at this point in time upon evaluation. The patient has been tolerating the dressing changes though. He's doing well with the IV antibiotic therapy. 08/20/18 on evaluation today patient appears to be doing wonderful in regard to his sacral region. In fact there appears to be no wound opening or drainage at this time. Overall I'm very pleased with how things have progressed. The patient likewise as well as his wife and daughter are very happy as well. Readmission: 11/20/18 on evaluation today patient presents for reevaluation our clinic concerning issues with his sacral region. Unfortunately the area in question is the same region which we previously treated what we thought would  successfully back in November 2019. At that time the patient underwent IV antibiotic therapy which seemed to do the job very well. Subsequently however in the past couple of weeks he has begun to have drainage and bleeding from the sacral region and is having increased pain yet again. Fortunately there is no signs of systemic infection although it does appear that the complex abscess that was previously noted may not have fully cleared there was a question between myself as well is infectious disease previous whether not he needed to see a surgeon being that things got better the family opted not to see a surgeon at that time. Nonetheless I am concerned that he may the need to see a surgeon in order to have this area surgically debrided and possibly a bone culture obtained in order to get a better idea of what we're treating and ensure that this is able to completely and fully heal. 11/27/18 on evaluation today  patient appears to be doing about the same in regard to his sacral ulcer. He did see Dr. Lysle Pearl yesterday and the plan is to proceed forward with surgery to clean out the region. This seems to be the most appropriate goal of treatment at this point. Dr. Lysle Pearl just needed to speak with Dr. Ellene Route the patient's neurologist in order to get clearance as the patient was supposed to be scheduled for a carpal tunnel and elbow surgery with Dr. Ellene Route. Nonetheless he has apparently given clearance to proceed with this surgery for the sacral region which is deemed more important at this point. Unfortunately the patient had a little bit of increased pain although he is having redness at this point there does not appear to be any spreading infection which is good news. No fevers, chills, nausea, or vomiting noted at this time. TERRIAN, SENTELL ([id number]) 12/03/18 on evaluation today patient actually appears to be doing about the same in regard to the sacral ulcer. He still waiting to hear back from Dr.  Ines Bloomer office in regard to scheduling his surgery. Fortunately there's no signs of systemic infection at this time which is good news. Unfortunately he really does not seem to making any progress the doxycycline does seem to at least be beneficial in helping to hold off the infection from worsening although unfortunately he still has a lot going on in this regard. Patient's daughter states she's actually gonna contact her office tomorrow to see what exactly is going on. 12/10/18 on evaluation today patient actually appears to be doing about the same in regard to her sacral room. Apparently he still waiting on approval from both cardiology as well as Dr. Ellene Route although apparently he's Artie gotten a formal letter from Dr. Ellene Route stated that he was clear to proceed with the sacral surgery. Di Kindle actually found the clearance letter from cardiology in epic as well today and this subsequently was given to the patient's daughter as that seems to be the only thing that was holding up proceeding with the surgery. Hopefully should be able to take that over to the surgeon's office today in order to go ahead and get this scheduled. 12/24/18 on evaluation today patient actually appears to be doing very well in regard to his sacral ulcer compared to when I last saw him. He has had surgery Dr. Lysle Pearl and it does appear that he was able to clear out this area of abscess very well without complication. This did extend down to the bone and a portion of the bone was sent for pathology and culture. Although on the pathology report it appears this is more tissue and not bone noted. With that being said the culture from the bone and Henrene Pastor asked him this was obtained revealed Corynebacterium striatum with no anaerobes isolated. Dr. Lysle Pearl did not place the patient on the antibiotics at this point. Again I have been debating on whether or not this is the patient to infectious disease I think that I am going to do that at  this point to gain their opinion on whether or not there's anything we should be treated in this regard and if so will be the best treatment options. 12/31/18 on evaluation today patient actually appears to be doing okay in regard to his sacral wound. Fortunately there does not appear to be any evidence of infection at this time which is good news. He has been tolerating the dressing's without complication. With that being said he did see infectious disease and  apparently they realize that the pathology samples sent from the operating room was actually. I'll see him and not bone and the question is whether the culture and sample or just contamination from the skin of not guilty and active infection. The daughter is very concerned about this she states that we may need to get a piece of bone to send for examination to ensure there's nothing more severe going on here. Obviously with this history of healing and then obsesses forming and then having to go for surgery now and reopening everything they have a reason to be concerned I completely agree. 01/07/19 on evaluation today patient actually appears to be doing well in regard to his sacral wound. Again we did get results back of his bone culture as well as the pathology which showed no evidence of osteomyelitis at this point this is excellent news. Overall I think that he likely does not need to go forward with the MRI since everything seems to be showing negative at this time. Especially in light of the fact that the wound appears to be doing well and that the infection and everything was running the wound bed seems to be dramatically improved. Patient's daughter is in agreement with this plan. 01/14/19 on evaluation today patient appears to be doing rather well at this time as far as the overall appearance of the sacral wound is concerned. The depth is slightly improved he has some granulation tissue noted there still is bone in the very bottom of the  wound bed although I'm no longer able to palpate this with my finger I can only tell by probing with the sterile cotton swab. Nonetheless I do believe this is shown signs of good improvement in healing as far as what I'm seeing at this point. 01/22/19 on evaluation today patient's wound in the sacral region appears to be doing rather well. Fortunately there does not seem to be any evidence of active infection at this time which is good news but patient overall has been doing excellent currently. I'm very pleased with how things have progressed over the past week. 01/28/19 on evaluation today patient actually appears to be doing very well in regard to his sacral wound. The area where the bone was exposed is closing in on becoming much more solid which is excellent news there does not appear to be any signs of active infection at this time also good news. Overall very pleased with how everything seems to be progressing. Electronic Signature(s) Signed: 01/28/2019 11:22:15 PM By: Worthy Keeler PA-C Entered By: Worthy Keeler on 01/28/2019 12:59:25 [name] ([id number]) -------------------------------------------------------------------------------- Physical Exam Details Patient Name: LORRAINE, TERRIQUEZ Date of Service: 01/28/2019 12:30 PM Medical Record Number: [id number] Patient Account Number: [id number] Date of Birth/Sex: [birth year] (85 y.o. M) Treating RN: Harold Barban Primary Care Provider: Burman Freestone Other Clinician: Referring Provider: Burman Freestone Treating Provider/Extender: STONE III, HOYT Weeks in Treatment: 9 Constitutional Well-nourished and well-hydrated in no acute distress. Respiratory normal breathing without difficulty. Psychiatric this patient is able to make decisions and demonstrates good insight into disease process. Alert and Oriented x 3. pleasant and cooperative. Notes Patient's wound bed currently showed signs of good granulation at this time there  does not appear to be any evidence of active infection which is excellent news overall I'm very pleased with how things have gone. The patient likewise is not have any significant discomfort which is also good news. Post debridement the wound bed appears to be doing much  better. Electronic Signature(s) Signed: 01/28/2019 11:22:15 PM By: Worthy Keeler PA-C Entered By: Worthy Keeler on 01/28/2019 13:00:02 [name] ([id number]) -------------------------------------------------------------------------------- Physician Orders Details Patient Name: KAIDYN, JAVID Date of Service: 01/28/2019 12:30 PM Medical Record Number: [id number] Patient Account Number: [id number] Date of Birth/Sex: August 24, 1931 (85 y.o. M) Treating RN: Harold Barban Primary Care Provider: Burman Freestone Other Clinician: Referring Provider: Burman Freestone Treating Provider/Extender: [name], HOYT Weeks in Treatment: 9 Verbal / Phone Orders: No Diagnosis Coding ICD-10 Coding Code Description L89.154 Pressure ulcer of sacral region, stage 4 L02.31 Cutaneous abscess of buttock Wound Cleansing Wound #2 Midline Sacrum o Clean wound with Normal Saline. o May Shower, gently pat wound dry prior to applying new dressing. Anesthetic (add to Medication List) Wound #2 Midline Sacrum o Topical Lidocaine 4% cream applied to wound bed prior to debridement (In Clinic Only). Primary Wound Dressing Wound #2 Midline Sacrum o Silver Alginate o Collagen - to wound bed Secondary Dressing Wound #2 Midline Sacrum o Boardered Foam Dressing Dressing Change Frequency Wound #2 Midline Sacrum o Change dressing every other day. Follow-up Appointments Wound #2 Midline Sacrum o Return Appointment in 1 week. Off-Loading Wound #2 Midline Sacrum o Turn and reposition every 2 hours Electronic Signature(s) Signed: 01/28/2019 2:00:35 PM By: Harold Barban Signed: 01/28/2019 11:22:15 PM By: Worthy Keeler  PA-C Entered By: Harold Barban on 01/28/2019 13:04:26 JONAS, GOH ([id number]) GIANPAOLO, MINDEL ([id number]) -------------------------------------------------------------------------------- Problem List Details Patient Name: LITTLE, WINTON Date of Service: 01/28/2019 12:30 PM Medical Record Number: [id number] Patient Account Number: [id number] Date of Birth/Sex: 03-Aug-1931 (85 y.o. M) Treating RN: Harold Barban Primary Care Provider: Burman Freestone Other Clinician: Referring Provider: Burman Freestone Treating Provider/Extender: [name], HOYT Weeks in Treatment: 9 Active Problems ICD-10 Evaluated Encounter Code Description Active Date Today Diagnosis L89.154 Pressure ulcer of sacral region, stage 4 11/20/2018 No Yes L02.31 Cutaneous abscess of buttock 11/20/2018 No Yes Inactive Problems Resolved Problems Electronic Signature(s) Signed: 01/28/2019 11:22:15 PM By: Worthy Keeler PA-C Entered By: Worthy Keeler on 01/28/2019 12:52:38 [name] ([id number]) -------------------------------------------------------------------------------- Progress Note Details Patient Name: [name] Date of Service: 01/28/2019 12:30 PM Medical Record Number: [id number] Patient Account Number: [id number] Date of Birth/Sex: [birth year] (85 y.o. M) Treating RN: Harold Barban Primary Care Provider: Burman Freestone Other Clinician: Referring Provider: Burman Freestone Treating Provider/Extender: [name], HOYT Weeks in Treatment: 9 Subjective Chief Complaint Information obtained from Patient Sacral pressure ulcer History of Present Illness (HPI) 02/06/18 on evaluation today patient presents for initial evaluation and our clinic concerning an issue which began roughly 3 weeks ago when the patient fell in his home on the floor in his kitchen and laid him down this detergent for roughly 3 days. He had a pressure injury to the left shoulder. This unfortunately has caused him a  lot of discomfort although it finally seems to be doing better if anything is really having a lot of itching right now. This appears potentially be a contact dermatitis issue. He also has a significant pressure injury to the sacrum at this time as well which is also showing fascia exposure right over the bone but no evidence of bone exposure at this point which is good news. They have been using Santyl as well as Saline soaked gauze at this point in time. There does appear to be a lot of necrotic slough in the base of the wound. He does have a history of incontinence, myocardial infarction, and hypertension. He also  is "borderline diabetic" hemoglobin A1c of 6.0. Currently he has some discomfort in the pressure site at the sacrum but fortunately nothing too significant this did require sharp debridement today. 02/13/18 on evaluation today patient appears to be doing much better in regard to his sacral wound. He has been tolerating the dressing changes without complication with the Vashe. Fortunately there is no evidence of infection and though there is some Slough on the surface of the wound bed he has excellent granulation noted. Overall I'm pleased with how things have progressed in that regard. A glance at his shoulder as well and the rash seems to be someone improving in my pinion at this site as well. Overall I am pleased with what we're seeing and so is the family. 02/20/18 on evaluation today patient appears to be doing a little worse in regard to the sacral wound only in the fact that there is redness surrounding it has me somewhat concerned for infection. The drainage has also apparently been a little bit off color compared to normal according to family they did keep the dressing today that was removed to show me and I agree this seems to be a little bit different compared to what we have been seeing. Coupled with the redness I'm concerned he may be developing some cellulitis surrounding the  wound bed. 02/27/18 on evaluation today patient presents for follow-up concerning his sacral ulcer. We have received the results back from his wound culture which shows unfortunately that the doxycycline will not be of benefit for him I am going to need to initiate treatment with something else in order to treat the pseudomonas. Otherwise he does not seem to be having any significant pain although his daughter states there are sometimes when he states having pain. We continue to use the Vashe currently. 03/06/18 on evaluation today patient's sacral wound appears to be doing better in my opinion. He has been tolerating the dressing changes without complication. With that being said the silver nitrate has helped with the prominent area of hyper granulation at the 6 o'clock location we will likely need to repeat this again today. Nonetheless overall I am pleased with how things have improved over the last week. The erythema surrounding the wound seems to be greatly improved. 03/13/18 on evaluation today patient's wound actually does not appear to be terribly infected although she does continue to have erythema surrounding the wound bed especially on the left border. I'm still somewhat concerned about the fact that the oral antibiotics alone may not be completely treating his infection. I previously discussed may need to go for IV antibiotic therapy I'm concerned that may be the case. We will need to make a referral today for infectious disease. 03/20/18 on evaluation today the patient sacral wound actually appears to be doing fairly well in regard to granulation although he continues unfortunately to have it your theme is surrounding the periwound region. There's also some increased swelling at the 6 o'clock location which also has me somewhat concerned. With that being said he does have some discomfort but NEAMIAH, SCIARRA. ([id number]) nothing too significant at this point. He still has not heard from  infectious disease his daughter and wife are both present during the office visit today they're going to check back with this again. We did get the information for them to call them today. 03/27/18 on evaluation today patient is seen concerning his ongoing sacral ulcer. He has been tolerating the dressing changes without complication. With that being  said he does present with evidence of bright green drainage noted on the dressing which again is something that I do often expect to see with a pseudomonas infection. He continues to have your theme is surrounding the wound bed as well and again I'm not 100% convinced this is just pressure related. I did speak with Colletta Maryland who is the nurse practitioner in [place] with infectious disease. I spoke with her actually yesterday concerning this patient. She is not 100% convinced that this is infected. She question whether the wound may just be colonized with Pseudomonas and not actually causing an active infection. I am really not thinking that the edema is associated with pressure alone and again overall I don't feel that your theme and is consistent with a pressure injury either as he's never had any contusions noted like a deep tissue injury on his heel which was new and I did visualize today this was on the left heel. Nonetheless she wanted to give this a little bit more time and thought it would be appropriate to start the Wound VAC at this point. 04/03/18 on evaluation today patient sacral ulcer actually appears to be doing fairly well at this point. He has been tolerating the dressing changes without complication. With that being said I'm very pleased with the progress that has been made in regard to his sacral wound over the past week I do not see as much in the way of erythema which is great news. Nonetheless he does have a small area of hyper granulation unfortunately. This is at roughly the 7 o'clock location and I think does need to be  addressed so that this will heal more appropriately. Nonetheless I think we may be ready to go ahead and initiate therapy with the Wound VAC. 04/10/18 on evaluation today patient appears to be doing excellent in regard to his sacral ulcer. The show signs of great improvement in overall I'm very pleased with how things look. He has been tolerating the dressing changes without complication. Specifically this is the Wound VAC. He also seems to be doing well with the antibiotic there is decreased your theme and redness surrounding the sacral area at this point in the wound has filled in quite significantly. 04/17/18 on evaluation today patient actually appears to be doing excellent in regard to his sacral ulcer. He's been tolerating the dressing changes without complication specifically the Wound VAC. There really are no major concerns from the patient nor family this point he is having no pain. He does have a little bit of Epiboly on the lateral portions of the wound where he does have a little bit more depth that will need to be addressed today. 04/23/18 on evaluation today patient's wound actually appears to be doing excellent at this point. He has been tolerating the Wound VAC and this appears to be doing well other than the fact that it seems to be breaking seal at the 6 o'clock location. I do believe that adding a duodenum dressing at this location try and help maintain the seal would be appropriate and likely very effective. With that being said he overall seems to be showing signs of good improvement at this point. There does not appear to be any evidence of significant infection which is also excellent news. 04/30/18 on evaluation today patient actually appears to be doing well in regard to his sacral ulcer. He's been tolerating the dressing changes without complication. Fortunately there does not appear to be any evidence of infection. Overall  I'm very pleased with the progress that has been made up  to this point. He does have some blistering underneath the draping unfortunately although this is definitely something that has been noted on other patients previously is a fairly common occurrence. Nonetheless the patient seems to be doing fairly well in general in my pinion based on what I see at this time. I do believe these are fairly superficial and minor. 05/07/18 on evaluation today patient's wound actually appears to be doing excellent at this point in time. He has been tolerating the Wound VAC decently well he states that it is somewhat cumbersome to carry around unfortunately. The only other issue he's been having according to family is that they been having a difficult time keeping the Wound VAC in place and doing what is supposed to do without making. Obviously I do think that this is definitely of concern. Nonetheless I do believe she's made good progress up to this point. I'm very happy in that regard. 05/14/18 on evaluation today patient's wound continues to make good progress at this point. He had a minimal amount of slough noted on the surface which was easily wiped away with saline and gauze and in general I feel like that he is continuing to show excellent progress even with the discontinuation of the Wound VAC. Overall I'm pleased in this regard. He was having issues with the Wound VAC in getting it to seal I think that using the Prisma at this time has been equally efficient and getting the wound to diminish in size. 05/29/18- He is here in follow up evaluation for a sacral ulcer. There is improvement, we will continue with prisma and he will follow up in two weeks GARRELL, FLAGG ([id number]) 06/11/18 on evaluation today patient had unfortunately bright green drainage on the dressing upon evaluation today. This is something that we have encountered before although we were able to get things under control previously with antibiotics. With that being said currently upon further  inspection of the three areas of hyper granulation that were separate from the actual wound itself which was almost healed it really appears that these all have some depth to them. They are more tunnels that really have not closed or at least have reopened as a result likely of infection in my pinion. This is definitely not what I was expecting or hoping for. 06/26/18 on evaluation today patient continues to experience issues with what appears to be small abscesses in the sacral region unfortunately. With that being said he has been tolerating the dressing changes without complication which is good news. He's not having any significant discomfort which is also good news. With that being said his daughter states that after he left last week that the packing that we have placed fell out quite rapidly and he subsequently healed over very quick to the point they were not able to even repack the regions. Nonetheless there appear to be several fluctuance areas noted at this point there's one central region that does seem to be draining still discharge that is somewhat green in color. I did review the results of the wound culture which did show evidence of infection with both MRSA as well as pseudomonas based on that result. Nonetheless again with his other current medications we are not able to do the Cipro due to issues with potential long QT syndrome. I am going to give him a prescription for doxycycline in order to help with the potential MRSA infection. 07/09/18 on  evaluation today patient actually appears to be doing about the same in regard to his sacral wound. He actually has his MRI scheduled for tomorrow and then subsequently is going to be having his infectious disease appointment for Thursday of this week. Fortunately he's not having any significant discomfort he still has your theme in the sacral region he still has several blister/flux went areas although they technically are not blisters this  is more like a underlying abscess. The one area that is open still does probe down to bone. Again I am concerned about a deeper infection possibly even sacral osteomyelitis. 07/23/18 on evaluation today patient actually appears to be doing very well all things considered in regard to his sacral ulcer. Since I've last seen him he actually did have his MRI performed which showed that he has a complex fluid collection superficial to the distal sacrum measuring 5.9 x 4.4 x 2.8 cm which abuts the posterior aspect of the distal sacrum and the sacrum itself shows cortical destruction consistent with osteomyelitis. She has also been seen by infectious disease and currently orders have been initiated for IV antibiotic therapy for the next eight weeks. He was seen by Janene Madeira NP and placed on Ceftazidime and Daptomycin. Currently he has not really been on this quite long enough to see a sufficient response to the new orders as far as antibiotic therapy is concerned. Nonetheless it does appear that he is likely on the right track at this point which is great news. Nonetheless the question which both Colletta Maryland and myself have discussed both with the patient and between ourselves is whether or not the patient may need to be seen by surgeon for surgical evacuation of the fluid collection/abscess and possible debridement of the sacrum itself. Nonetheless at this time my personal opinion is probably gonna be that we wait and get this at least a couple weeks to see the response that he receives with the IV antibiotic therapy. 08/06/18 on evaluation today patient actually appears to be doing rather well in regard to his sacral ulcer region all things considered. He has been tolerating the dressing changes without complication. With that being said there is not any obvious opening nor any drainage noted at this point in time upon evaluation. The patient has been tolerating the dressing changes though. He's  doing well with the IV antibiotic therapy. 08/20/18 on evaluation today patient appears to be doing wonderful in regard to his sacral region. In fact there appears to be no wound opening or drainage at this time. Overall I'm very pleased with how things have progressed. The patient likewise as well as his wife and daughter are very happy as well. Readmission: 11/20/18 on evaluation today patient presents for reevaluation our clinic concerning issues with his sacral region. Unfortunately the area in question is the same region which we previously treated what we thought would successfully back in November 2019. At that time the patient underwent IV antibiotic therapy which seemed to do the job very well. Subsequently however in the past couple of weeks he has begun to have drainage and bleeding from the sacral region and is having increased pain yet again. Fortunately there is no signs of systemic infection although it does appear that the complex abscess that was previously noted may not have fully cleared there was a question between myself as well is infectious disease previous whether not he needed to see a surgeon being that things got better the family opted not to see a  surgeon at that time. Nonetheless I am concerned that he may the need to see a surgeon in order to have this area surgically debrided and possibly a bone culture obtained in order to get a better idea of what we're treating and ensure that this is able to completely and fully heal. 11/27/18 on evaluation today patient appears to be doing about the same in regard to his sacral ulcer. He did see Dr. Viviano Simas, [name] ([id number]) yesterday and the plan is to proceed forward with surgery to clean out the region. This seems to be the most appropriate goal of treatment at this point. Dr. Lysle Pearl just needed to speak with Dr. Ellene Route the patient's neurologist in order to get clearance as the patient was supposed to be scheduled for a  carpal tunnel and elbow surgery with Dr. Ellene Route. Nonetheless he has apparently given clearance to proceed with this surgery for the sacral region which is deemed more important at this point. Unfortunately the patient had a little bit of increased pain although he is having redness at this point there does not appear to be any spreading infection which is good news. No fevers, chills, nausea, or vomiting noted at this time. 12/03/18 on evaluation today patient actually appears to be doing about the same in regard to the sacral ulcer. He still waiting to hear back from Dr. Ines Bloomer office in regard to scheduling his surgery. Fortunately there's no signs of systemic infection at this time which is good news. Unfortunately he really does not seem to making any progress the doxycycline does seem to at least be beneficial in helping to hold off the infection from worsening although unfortunately he still has a lot going on in this regard. Patient's daughter states she's actually gonna contact her office tomorrow to see what exactly is going on. 12/10/18 on evaluation today patient actually appears to be doing about the same in regard to her sacral room. Apparently he still waiting on approval from both cardiology as well as Dr. Ellene Route although apparently he's Artie gotten a formal letter from Dr. Ellene Route stated that he was clear to proceed with the sacral surgery. Di Kindle actually found the clearance letter from cardiology in epic as well today and this subsequently was given to the patient's daughter as that seems to be the only thing that was holding up proceeding with the surgery. Hopefully should be able to take that over to the surgeon's office today in order to go ahead and get this scheduled. 12/24/18 on evaluation today patient actually appears to be doing very well in regard to his sacral ulcer compared to when I last saw him. He has had surgery Dr. Lysle Pearl and it does appear that he was able to clear out  this area of abscess very well without complication. This did extend down to the bone and a portion of the bone was sent for pathology and culture. Although on the pathology report it appears this is more tissue and not bone noted. With that being said the culture from the bone and Henrene Pastor asked him this was obtained revealed Corynebacterium striatum with no anaerobes isolated. Dr. Lysle Pearl did not place the patient on the antibiotics at this point. Again I have been debating on whether or not this is the patient to infectious disease I think that I am going to do that at this point to gain their opinion on whether or not there's anything we should be treated in this regard and if so will be  the best treatment options. 12/31/18 on evaluation today patient actually appears to be doing okay in regard to his sacral wound. Fortunately there does not appear to be any evidence of infection at this time which is good news. He has been tolerating the dressing's without complication. With that being said he did see infectious disease and apparently they realize that the pathology samples sent from the operating room was actually. I'll see him and not bone and the question is whether the culture and sample or just contamination from the skin of not guilty and active infection. The daughter is very concerned about this she states that we may need to get a piece of bone to send for examination to ensure there's nothing more severe going on here. Obviously with this history of healing and then obsesses forming and then having to go for surgery now and reopening everything they have a reason to be concerned I completely agree. 01/07/19 on evaluation today patient actually appears to be doing well in regard to his sacral wound. Again we did get results back of his bone culture as well as the pathology which showed no evidence of osteomyelitis at this point this is excellent news. Overall I think that he likely does not  need to go forward with the MRI since everything seems to be showing negative at this time. Especially in light of the fact that the wound appears to be doing well and that the infection and everything was running the wound bed seems to be dramatically improved. Patient's daughter is in agreement with this plan. 01/14/19 on evaluation today patient appears to be doing rather well at this time as far as the overall appearance of the sacral wound is concerned. The depth is slightly improved he has some granulation tissue noted there still is bone in the very bottom of the wound bed although I'm no longer able to palpate this with my finger I can only tell by probing with the sterile cotton swab. Nonetheless I do believe this is shown signs of good improvement in healing as far as what I'm seeing at this point. 01/22/19 on evaluation today patient's wound in the sacral region appears to be doing rather well. Fortunately there does not seem to be any evidence of active infection at this time which is good news but patient overall has been doing excellent currently. I'm very pleased with how things have progressed over the past week. 01/28/19 on evaluation today patient actually appears to be doing very well in regard to his sacral wound. The area where the bone was exposed is closing in on becoming much more solid which is excellent news there does not appear to be any signs of active infection at this time also good news. Overall very pleased with how everything seems to be progressing. Patient History Information obtained from Patient. ANUP, BRIGHAM ([id number]) Family History Cancer - Paternal Grandparents, Diabetes - Father, Heart Disease - Mother,Father, Stroke - Father, No family history of Hypertension, Kidney Disease, Lung Disease, Seizures, Thyroid Problems, Tuberculosis. Social History Never smoker, Marital Status - Widowed, Alcohol Use - Never, Drug Use - No History, Caffeine Use -  Daily. Medical History Eyes Patient has history of Cataracts - bilateral removal Denies history of Glaucoma, Optic Neuritis Ear/Nose/Mouth/Throat Denies history of Chronic sinus problems/congestion, Middle ear problems Hematologic/Lymphatic Denies history of Anemia, Hemophilia, Human Immunodeficiency Virus, Lymphedema, Sickle Cell Disease Respiratory Patient has history of Asthma Denies history of Aspiration, Chronic Obstructive Pulmonary Disease (COPD), Pneumothorax, Sleep  Apnea, Tuberculosis Cardiovascular Patient has history of Angina, Arrhythmia, Coronary Artery Disease, Hypertension, Myocardial Infarction - 2001 Denies history of Congestive Heart Failure, Deep Vein Thrombosis, Hypotension, Peripheral Arterial Disease, Peripheral Venous Disease, Phlebitis, Vasculitis Gastrointestinal Denies history of Cirrhosis , Colitis, Crohn s, Hepatitis A, Hepatitis B, Hepatitis C Endocrine Denies history of Type I Diabetes, Type II Diabetes Genitourinary Denies history of End Stage Renal Disease Immunological Denies history of Lupus Erythematosus, Raynaud s, Scleroderma Integumentary (Skin) Denies history of History of Burn, History of pressure wounds Musculoskeletal Patient has history of Osteoarthritis Denies history of Gout, Rheumatoid Arthritis, Osteomyelitis Neurologic Patient has history of Dementia, Neuropathy Denies history of Quadriplegia, Paraplegia, Seizure Disorder Oncologic Denies history of Received Chemotherapy, Received Radiation Psychiatric Denies history of Anorexia/bulimia, Confinement Anxiety Hospitalization/Surgery History - Fall. Medical And Surgical History Notes Endocrine Borderline Oncologic Melanoma on back Review of Systems (ROS) Constitutional Symptoms (General Health) Denies complaints or symptoms of Fatigue, Fever, Chills, Marked Weight Change. Respiratory Denies complaints or symptoms of Chronic or frequent coughs, Shortness of  Breath. Cardiovascular Denies complaints or symptoms of Chest pain, LE edema. Psychiatric DAMEER, SPEISER ([id number]) Denies complaints or symptoms of Anxiety, Claustrophobia. Objective Constitutional Well-nourished and well-hydrated in no acute distress. Vitals Time Taken: 12:40 PM, Height: 69 in, Weight: 170.6 lbs, BMI: 25.2, Temperature: 98.3 F, Pulse: 68 bpm, Respiratory Rate: 16 breaths/min, Blood Pressure: 135/61 mmHg. Respiratory normal breathing without difficulty. Psychiatric this patient is able to make decisions and demonstrates good insight into disease process. Alert and Oriented x 3. pleasant and cooperative. General Notes: Patient's wound bed currently showed signs of good granulation at this time there does not appear to be any evidence of active infection which is excellent news overall I'm very pleased with how things have gone. The patient likewise is not have any significant discomfort which is also good news. Post debridement the wound bed appears to be doing much better. Integumentary (Hair, Skin) Wound #2 status is Open. Original cause of wound was Gradually Appeared. The wound is located on the Midline Sacrum. The wound measures 2cm length x 1cm width x 1cm depth; 1.571cm^2 area and 1.571cm^3 volume. There is Fat Layer (Subcutaneous Tissue) Exposed exposed. There is no tunneling noted, however, there is undermining starting at 9:00 and ending at 2:00 with a maximum distance of 0.2cm. There is a medium amount of purulent drainage noted. The wound margin is indistinct and nonvisible. There is small (1-33%) pale, hyper - granulation within the wound bed. There is a medium (34- 66%) amount of necrotic tissue within the wound bed including Adherent Slough. The periwound skin appearance exhibited: Scarring, Erythema. The periwound skin appearance did not exhibit: Callus, Crepitus, Excoriation, Induration, Rash, Dry/Scaly, Maceration, Atrophie Blanche, Cyanosis,  Ecchymosis, Hemosiderin Staining, Mottled, Pallor, Rubor. The surrounding wound skin color is noted with erythema which is circumferential. Periwound temperature was noted as No Abnormality. The periwound has tenderness on palpation. Assessment Active Problems ICD-10 Pressure ulcer of sacral region, stage 4 Cutaneous abscess of buttock KUTTER, SCHNEPF ([id number]) Procedures Wound #2 Pre-procedure diagnosis of Wound #2 is a Pressure Ulcer located on the Midline Sacrum . There was a Excisional Skin/Subcutaneous Tissue Debridement with a total area of 2 sq cm performed by STONE III, HOYT E., PA-C. With the following instrument(s): Curette to remove Viable and Non-Viable tissue/material. Material removed includes Subcutaneous Tissue and Slough and after achieving pain control using Lidocaine. No specimens were taken. A time out was conducted at 12:56, prior to the start of the procedure.  A Minimum amount of bleeding was controlled with Pressure. The procedure was tolerated well with a pain level of 0 throughout and a pain level of 0 following the procedure. Post Debridement Measurements: 2cm length x 1cm width x 1cm depth; 1.571cm^3 volume. Post debridement Stage noted as Category/Stage IV. Character of Wound/Ulcer Post Debridement is improved. Post procedure Diagnosis Wound #2: Same as Pre-Procedure Plan Wound Cleansing: Wound #2 Midline Sacrum: Clean wound with Normal Saline. May Shower, gently pat wound dry prior to applying new dressing. Anesthetic (add to Medication List): Wound #2 Midline Sacrum: Topical Lidocaine 4% cream applied to wound bed prior to debridement (In Clinic Only). Primary Wound Dressing: Wound #2 Midline Sacrum: Silver Alginate Collagen - to wound bed Secondary Dressing: Wound #2 Midline Sacrum: Boardered Foam Dressing Dressing Change Frequency: Wound #2 Midline Sacrum: Change dressing every other day. Follow-up Appointments: Wound #2 Midline  Sacrum: Return Appointment in 1 week. Off-Loading: Wound #2 Midline Sacrum: Turn and reposition every 2 hours I'm gonna recommend that we continue with the above wound care measures for the next week and the patient is in agreement with plan. If anything changes or worsens in the meantime he will contact the office and let me know. Please see above for specific wound care orders. We will see patient for re-evaluation in 1 week(s) here in the clinic. If anything worsens or changes patient will contact our office for additional recommendations. Electronic Signature(s) Signed: 01/28/2019 11:22:15 PM By: Molli Barrows ([id number]) Entered By: Worthy Keeler on 01/28/2019 23:07:12 [name] ([id number]) -------------------------------------------------------------------------------- ROS/PFSH Details Patient Name: PLACIDO, HANGARTNER Date of Service: 01/28/2019 12:30 PM Medical Record Number: [id number] Patient Account Number: [id number] Date of Birth/Sex: [birth year] (85 y.o. M) Treating RN: Harold Barban Primary Care Provider: Burman Freestone Other Clinician: Referring Provider: Burman Freestone Treating Provider/Extender: [name], HOYT Weeks in Treatment: 9 Information Obtained From Patient Constitutional Symptoms (General Health) Complaints and Symptoms: Negative for: Fatigue; Fever; Chills; Marked Weight Change Respiratory Complaints and Symptoms: Negative for: Chronic or frequent coughs; Shortness of Breath Medical History: Positive for: Asthma Negative for: Aspiration; Chronic Obstructive Pulmonary Disease (COPD); Pneumothorax; Sleep Apnea; Tuberculosis Cardiovascular Complaints and Symptoms: Negative for: Chest pain; LE edema Medical History: Positive for: Angina; Arrhythmia; Coronary Artery Disease; Hypertension; Myocardial Infarction - 2001 Negative for: Congestive Heart Failure; Deep Vein Thrombosis; Hypotension; Peripheral Arterial Disease;  Peripheral Venous Disease; Phlebitis; Vasculitis Psychiatric Complaints and Symptoms: Negative for: Anxiety; Claustrophobia Medical History: Negative for: Anorexia/bulimia; Confinement Anxiety Eyes Medical History: Positive for: Cataracts - bilateral removal Negative for: Glaucoma; Optic Neuritis Ear/Nose/Mouth/Throat Medical History: Negative for: Chronic sinus problems/congestion; Middle ear problems Hematologic/Lymphatic Medical History: Negative for: Anemia; Hemophilia; Human Immunodeficiency Virus; Lymphedema; Sickle Cell Disease JAYSION, RAMSEYER ([id number]) Gastrointestinal Medical History: Negative for: Cirrhosis ; Colitis; Crohnos; Hepatitis A; Hepatitis B; Hepatitis C Endocrine Medical History: Negative for: Type I Diabetes; Type II Diabetes Past Medical History Notes: Borderline Genitourinary Medical History: Negative for: End Stage Renal Disease Immunological Medical History: Negative for: Lupus Erythematosus; Raynaudos; Scleroderma Integumentary (Skin) Medical History: Negative for: History of Burn; History of pressure wounds Musculoskeletal Medical History: Positive for: Osteoarthritis Negative for: Gout; Rheumatoid Arthritis; Osteomyelitis Neurologic Medical History: Positive for: Dementia; Neuropathy Negative for: Quadriplegia; Paraplegia; Seizure Disorder Oncologic Medical History: Negative for: Received Chemotherapy; Received Radiation Past Medical History Notes: Melanoma on back HBO Extended History Items Eyes: Cataracts Immunizations Pneumococcal Vaccine: Received Pneumococcal Vaccination: Yes Implantable Devices No devices added Hospitalization / Surgery History Type of  Hospitalization/Surgery JOHNEL, YIELDING ([id number]) Fall Family and Social History Cancer: Yes - Paternal Grandparents; Diabetes: Yes - Father; Heart Disease: Yes - Mother,Father; Hypertension: No; Kidney Disease: No; Lung Disease: No; Seizures: No; Stroke: Yes -  Father; Thyroid Problems: No; Tuberculosis: No; Never smoker; Marital Status - Widowed; Alcohol Use: Never; Drug Use: No History; Caffeine Use: Daily; Financial Concerns: No; Food, Clothing or Shelter Needs: No; Support System Lacking: No; Transportation Concerns: No Physician Affirmation I have reviewed and agree with the above information. Electronic Signature(s) Signed: 01/28/2019 2:00:35 PM By: Harold Barban Signed: 01/28/2019 11:22:15 PM By: Worthy Keeler PA-C Entered By: Worthy Keeler on 01/28/2019 12:59:41 Issai, Werling [name] ([id number]) -------------------------------------------------------------------------------- SuperBill Details Patient Name: [name] Date of Service: 01/28/2019 Medical Record Number: [id number] Patient Account Number: [id number] Date of Birth/Sex: Nov 28, 1930 (85 y.o. M) Treating RN: Harold Barban Primary Care Provider: Burman Freestone Other Clinician: Referring Provider: Burman Freestone Treating Provider/Extender: [name], HOYT Weeks in Treatment: 9 Diagnosis Coding ICD-10 Codes Code Description L89.154 Pressure ulcer of sacral region, stage 4 L02.31 Cutaneous abscess of buttock Facility Procedures CPT4 Code: 16606004 Description: 59977 - DEB SUBQ TISSUE 20 SQ CM/< ICD-10 Diagnosis Description L89.154 Pressure ulcer of sacral region, stage 4 Modifier: Quantity: 1 Physician Procedures CPT4 Code: [id number] Description: 32023 - WC PHYS SUBQ TISS 20 SQ CM ICD-10 Diagnosis Description L89.154 Pressure ulcer of sacral region, stage 4 Modifier: Quantity: 1 Electronic Signature(s) Signed: 01/28/2019 11:22:15 PM By: Worthy Keeler PA-C Entered By: Worthy Keeler on 01/28/2019 13:00:30

## 2019-01-29 NOTE — Progress Notes (Signed)
[name] ([id number]) Visit Report for 01/28/2019 Arrival Information Details Patient Name: [name], [name]. Date of Service: 01/28/2019 12:30 PM Medical Record Number: [id number] Patient Account Number: [id number] Date of Birth/Sex: 08-Oct-1931 (85 y.o. M) Treating RN: Cornell Barman Primary Care Somaya Grassi: Burman Freestone Other Clinician: Referring Valisha Heslin: Burman Freestone Treating Myleka Moncure/Extender: [name], HOYT Weeks in Treatment: 9 Visit Information History Since Last Visit Added or deleted any medications: No Patient Arrived: Ambulatory Any new allergies or adverse reactions: No Arrival Time: 12:39 Had a fall or experienced change in No Accompanied By: self activities of daily living that may affect Transfer Assistance: None risk of falls: Patient Identification Verified: Yes Signs or symptoms of abuse/neglect since last visito No Secondary Verification Process Yes Hospitalized since last visit: No Completed: Implantable device outside of the clinic excluding No Patient Has Alerts: Yes cellular tissue based products placed in the center Patient Alerts: Patient on Blood since last visit: Thinner Has Dressing in Place as Prescribed: Yes Pain Present Now: No Electronic Signature(s) Signed: 01/28/2019 2:22:43 PM By: Gretta Cool, BSN, RN, CWS, Kim RN, BSN Entered By: Gretta Cool, BSN, RN, CWS, Kim on 01/28/2019 12:40:12 [name] ([id number]) -------------------------------------------------------------------------------- Encounter Discharge Information Details Patient Name: [name]. Date of Service: 01/28/2019 12:30 PM Medical Record Number: [id number] Patient Account Number: [id number] Date of Birth/Sex: 31-Aug-1931 (85 y.o. M) Treating RN: Harold Barban Primary Care Karena Kinker: Burman Freestone Other Clinician: Referring Darchelle Nunes: Burman Freestone Treating Shellie Rogoff/Extender: [name], HOYT Weeks in Treatment: 9 Encounter Discharge Information Items Post  Procedure Vitals Discharge Condition: Stable Temperature (F): 98.3 Ambulatory Status: Cane Pulse (bpm): 68 Discharge Destination: Home Respiratory Rate (breaths/min): 16 Transportation: Private Auto Blood Pressure (mmHg): 135/61 Accompanied By: daughter Schedule Follow-up Appointment: Yes Clinical Summary of Care: Electronic Signature(s) Signed: 01/28/2019 1:58:33 PM By: Harold Barban Entered By: Harold Barban on 01/28/2019 13:58:33 [name] ([id number]) -------------------------------------------------------------------------------- Lower Extremity Assessment Details Patient Name: [name] Date of Service: 01/28/2019 12:30 PM Medical Record Number: [id number] Patient Account Number: [id number] Date of Birth/Sex: Jul 23, 1931 (85 y.o. M) Treating RN: Cornell Barman Primary Care Linna Thebeau: Burman Freestone Other Clinician: Referring Erskine Steinfeldt: Burman Freestone Treating Elianny Buxbaum/Extender: Sharalyn Ink in Treatment: 9 Electronic Signature(s) Signed: 01/28/2019 2:22:43 PM By: Gretta Cool, BSN, RN, CWS, Kim RN, BSN Entered By: Gretta Cool, BSN, RN, CWS, Kim on 01/28/2019 12:46:54 [name] ([id number]) -------------------------------------------------------------------------------- Multi Wound Chart Details Patient Name: [name] Date of Service: 01/28/2019 12:30 PM Medical Record Number: [id number] Patient Account Number: [id number] Date of Birth/Sex: [birth year] (85 y.o. M) Treating RN: Harold Barban Primary Care Krissa Utke: Burman Freestone Other Clinician: Referring Marlos Carmen: Burman Freestone Treating Keari Miu/Extender: [name], HOYT Weeks in Treatment: 9 Vital Signs Height(in): 69 Pulse(bpm): 68 Weight(lbs): 170.6 Blood Pressure(mmHg): 135/61 Body Mass Index(BMI): 25 Temperature(F): 98.3 Respiratory Rate 16 (breaths/min): Photos: [N/A:N/A] Wound Location: Sacrum - Midline N/A N/A Wounding Event: Gradually Appeared N/A N/A Primary Etiology:  Pressure Ulcer N/A N/A Secondary Etiology: Abscess N/A N/A Comorbid History: Cataracts, Asthma, Angina, N/A N/A Arrhythmia, Coronary Artery Disease, Hypertension, Myocardial Infarction, Osteoarthritis, Dementia, Neuropathy Date Acquired: 10/30/2018 N/A N/A Weeks of Treatment: 9 N/A N/A Wound Status: Open N/A N/A Measurements L x W x D 2x1x1 N/A N/A (cm) Area (cm) : 1.571 N/A N/A Volume (cm) : 1.571 N/A N/A % Reduction in Area: -19537.50% N/A N/A % Reduction in Volume: -7755.00% N/A N/A Starting Position 1 9 (o'clock): Ending Position 1 2 (o'clock): Maximum Distance 1 (cm): 0.2 Undermining: Yes N/A N/A Classification: Category/Stage IV N/A N/A  Exudate Amount: Medium N/A N/A Exudate Type: Purulent N/A N/A Exudate Color: yellow, brown, green N/A N/A [name], [name] ([id number]) Wound Margin: Indistinct, nonvisible N/A N/A Granulation Amount: Small (1-33%) N/A N/A Granulation Quality: Pale, Hyper-granulation N/A N/A Necrotic Amount: Medium (34-66%) N/A N/A Exposed Structures: Fat Layer (Subcutaneous N/A N/A Tissue) Exposed: Yes Fascia: No Tendon: No Muscle: No Joint: No Bone: No Epithelialization: Medium (34-66%) N/A N/A Periwound Skin Texture: Scarring: Yes N/A N/A Excoriation: No Induration: No Callus: No Crepitus: No Rash: No Periwound Skin Moisture: Maceration: No N/A N/A Dry/Scaly: No Periwound Skin Color: Erythema: Yes N/A N/A Atrophie Blanche: No Cyanosis: No Ecchymosis: No Hemosiderin Staining: No Mottled: No Pallor: No Rubor: No Erythema Location: Circumferential N/A N/A Temperature: No Abnormality N/A N/A Tenderness on Palpation: Yes N/A N/A Treatment Notes Electronic Signature(s) Signed: 01/28/2019 2:00:35 PM By: Harold Barban Entered By: Harold Barban on 01/28/2019 12:54:34 [name] ([id number]) -------------------------------------------------------------------------------- Multi-Disciplinary Care Plan Details Patient Name:  [name] Date of Service: 01/28/2019 12:30 PM Medical Record Number: [id number] Patient Account Number: [id number] Date of Birth/Sex: 1931/02/09 (85 y.o. M) Treating RN: Harold Barban Primary Care Meaghann Choo: Burman Freestone Other Clinician: Referring Zalia Hautala: Burman Freestone Treating Marquis Diles/Extender: [name], HOYT Weeks in Treatment: 9 Active Inactive Abuse / Safety / Falls / Self Care Management Nursing Diagnoses: Potential for falls Goals: Patient will not experience any injury related to falls Date Initiated: 11/20/2018 Target Resolution Date: 02/15/2019 Goal Status: Active Interventions: Assess fall risk on admission and as needed Notes: Orientation to the Wound Care Program Nursing Diagnoses: Knowledge deficit related to the wound healing center program Goals: Patient/caregiver will verbalize understanding of the [place] Program Date Initiated: 11/20/2018 Target Resolution Date: 02/15/2019 Goal Status: Active Interventions: Provide education on orientation to the wound center Notes: Wound/Skin Impairment Nursing Diagnoses: Impaired tissue integrity Goals: Ulcer/skin breakdown will heal within 14 weeks Date Initiated: 11/20/2018 Target Resolution Date: 02/15/2019 Goal Status: Active Interventions: Assess patient/caregiver ability to obtain necessary supplies SEWARD, CORAN ([id number]) Assess patient/caregiver ability to perform ulcer/skin care regimen upon admission and as needed Assess ulceration(s) every visit Notes: Electronic Signature(s) Signed: 01/28/2019 2:00:35 PM By: Harold Barban Entered By: Harold Barban on 01/28/2019 12:54:24 [name] ([id number]) -------------------------------------------------------------------------------- Pain Assessment Details Patient Name: [name] Date of Service: 01/28/2019 12:30 PM Medical Record Number: [id number] Patient Account Number: [id number] Date of Birth/Sex: Mar 10, 1931 (85  y.o. M) Treating RN: Cornell Barman Primary Care Gahel Safley: Burman Freestone Other Clinician: Referring Travion Ke: Burman Freestone Treating Vinaya Sancho/Extender: [name], HOYT Weeks in Treatment: 9 Active Problems Location of Pain Severity and Description of Pain Patient Has Paino No Site Locations With Dressing Change: No Pain Management and Medication Current Pain Management: Notes Patient denies pain at this time. Electronic Signature(s) Signed: 01/28/2019 2:22:43 PM By: Gretta Cool, BSN, RN, CWS, Kim RN, BSN Entered By: Gretta Cool, BSN, RN, CWS, Kim on 01/28/2019 12:40:38 [name] ([id number]) -------------------------------------------------------------------------------- Patient/Caregiver Education Details Patient Name: URIAN, MARTENSON Date of Service: 01/28/2019 12:30 PM Medical Record Number: [id number] Patient Account Number: [id number] Date of Birth/Gender: June 18, 1931 (85 y.o. M) Treating RN: Harold Barban Primary Care Physician: Burman Freestone Other Clinician: Referring Physician: Burman Freestone Treating Physician/Extender: Sharalyn Ink in Treatment: 9 Education Assessment Education Provided To: Patient Education Topics Provided Wound/Skin Impairment: Handouts: Caring for Your Ulcer Methods: Demonstration, Explain/Verbal Responses: State content correctly Electronic Signature(s) Signed: 01/28/2019 2:00:35 PM By: Harold Barban Entered By: Harold Barban on 01/28/2019 12:54:54 [name] ([id number]) -------------------------------------------------------------------------------- Wound Assessment Details  Patient Name: MATILDE, MARKIE. Date of Service: 01/28/2019 12:30 PM Medical Record Number: [id number] Patient Account Number: [id number] Date of Birth/Sex: 08/30/1931 (85 y.o. M) Treating RN: Cornell Barman Primary Care Kimberli Winne: Burman Freestone Other Clinician: Referring Jyoti Harju: Burman Freestone Treating Tylor Courtwright/Extender: [name], HOYT Weeks  in Treatment: 9 Wound Status Wound Number: 2 Primary Pressure Ulcer Etiology: Wound Location: Sacrum - Midline Secondary Abscess Wounding Event: Gradually Appeared Etiology: Date Acquired: 10/30/2018 Wound Open Weeks Of Treatment: 9 Status: Clustered Wound: No Comorbid Cataracts, Asthma, Angina, Arrhythmia, History: Coronary Artery Disease, Hypertension, Myocardial Infarction, Osteoarthritis, Dementia, Neuropathy Photos Wound Measurements Length: (cm) 2 % Reduction Width: (cm) 1 % Reduction Depth: (cm) 1 Epitheliali Area: (cm) 1.571 Tunneling: Volume: (cm) 1.571 Underminin Starting Ending P Maximum in Area: -19537.5% in Volume: -7755% zation: Medium (34-66%) No g: Yes Position (o'clock): 9 osition (o'clock): 2 Distance: (cm) 0.2 Wound Description Classification: Category/Stage IV Foul Odor A Wound Margin: Indistinct, nonvisible Slough/Fibr Exudate Amount: Medium Exudate Type: Purulent Exudate Color: yellow, brown, green fter Cleansing: No ino No Wound Bed Granulation Amount: Small (1-33%) Exposed Structure Granulation Quality: Pale, Hyper-granulation Fascia Exposed: No Necrotic Amount: Medium (34-66%) Fat Layer (Subcutaneous Tissue) Exposed: Yes DAWAYNE, OHAIR ([id number]) Necrotic Quality: Adherent Slough Tendon Exposed: No Muscle Exposed: No Joint Exposed: No Bone Exposed: No Periwound Skin Texture Texture Color No Abnormalities Noted: No No Abnormalities Noted: No Callus: No Atrophie Blanche: No Crepitus: No Cyanosis: No Excoriation: No Ecchymosis: No Induration: No Erythema: Yes Rash: No Erythema Location: Circumferential Scarring: Yes Hemosiderin Staining: No Mottled: No Moisture Pallor: No No Abnormalities Noted: No Rubor: No Dry / Scaly: No Maceration: No Temperature / Pain Temperature: No Abnormality Tenderness on Palpation: Yes Treatment Notes Wound #2 (Midline Sacrum) Notes plain collagen, silver cell, BFD Electronic  Signature(s) Signed: 01/28/2019 2:22:43 PM By: Gretta Cool, BSN, RN, CWS, Kim RN, BSN Entered By: Gretta Cool, BSN, RN, CWS, Kim on 01/28/2019 12:46:38 [name] ([id number]) -------------------------------------------------------------------------------- Vitals Details Patient Name: [name] Date of Service: 01/28/2019 12:30 PM Medical Record Number: [id number] Patient Account Number: [id number] Date of Birth/Sex: [birth year] (85 y.o. M) Treating RN: Cornell Barman Primary Care Robertson Colclough: Burman Freestone Other Clinician: Referring Lashanti Chambless: Burman Freestone Treating Lynda Capistran/Extender: [name], HOYT Weeks in Treatment: 9 Vital Signs Time Taken: 12:40 Temperature (F): 98.3 Height (in): 69 Pulse (bpm): 68 Weight (lbs): 170.6 Respiratory Rate (breaths/min): 16 Body Mass Index (BMI): 25.2 Blood Pressure (mmHg): 135/61 Reference Range: 80 - 120 mg / dl Electronic Signature(s) Signed: 01/28/2019 2:22:43 PM By: Gretta Cool, BSN, RN, CWS, Kim RN, BSN Entered By: Gretta Cool, BSN, RN, CWS, Kim on 01/28/2019 12:41:42

## 2019-02-04 ENCOUNTER — Encounter: Payer: Medicare Other | Admitting: Physician Assistant

## 2019-02-04 ENCOUNTER — Other Ambulatory Visit: Payer: Self-pay

## 2019-02-04 DIAGNOSIS — L89154 Pressure ulcer of sacral region, stage 4: Secondary | ICD-10-CM | POA: Diagnosis not present

## 2019-02-04 NOTE — Progress Notes (Addendum)
[name] ([id number]) Visit Report for 02/04/2019 Arrival Information Details Patient Name: [name], [name]. Date of Service: 02/04/2019 12:30 PM Medical Record Number: [id number] Patient Account Number: [id number] Date of Birth/Sex: August 02, 1931 (85 y.o. M) Treating RN: Harold Barban Primary Care Latalia Etzler: Burman Freestone Other Clinician: Referring Tenesia Escudero: Burman Freestone Treating Domino Holten/Extender: [name], HOYT Weeks in Treatment: 10 Visit Information History Since Last Visit Added or deleted any medications: No Patient Arrived: Ambulatory Any new allergies or adverse reactions: No Arrival Time: 12:39 Had a fall or experienced change in No Accompanied By: wife activities of daily living that may affect Transfer Assistance: None risk of falls: Patient Identification Verified: Yes Signs or symptoms of abuse/neglect since last visito No Secondary Verification Process Yes Hospitalized since last visit: No Completed: Implantable device outside of the clinic excluding No Patient Has Alerts: Yes cellular tissue based products placed in the center Patient Alerts: Patient on Blood since last visit: Thinner Has Dressing in Place as Prescribed: Yes Pain Present Now: No Electronic Signature(s) Signed: 02/04/2019 2:49:09 PM By: Lorine Bears RCP, RRT, CHT Entered By: Lorine Bears on 02/04/2019 12:39:48 [name] ([id number]) -------------------------------------------------------------------------------- Lower Extremity Assessment Details Patient Name: [name] Date of Service: 02/04/2019 12:30 PM Medical Record Number: [id number] Patient Account Number: [id number] Date of Birth/Sex: [birth year] (85 y.o. M) Treating RN: Montey Hora Primary Care Shylin Keizer: Burman Freestone Other Clinician: Referring Miraya Cudney: Burman Freestone Treating Micalah Cabezas/Extender: [name], HOYT Weeks in Treatment: 10 Electronic Signature(s) Signed:  02/04/2019 12:36:34 PM By: Montey Hora Entered By: Montey Hora on 02/04/2019 12:36:34 [name] ([id number]) -------------------------------------------------------------------------------- Multi Wound Chart Details Patient Name: [name] Date of Service: 02/04/2019 12:30 PM Medical Record Number: [id number] Patient Account Number: [id number] Date of Birth/Sex: July 03, 1931 (85 y.o. M) Treating RN: Harold Barban Primary Care Janira Mandell: Burman Freestone Other Clinician: Referring Keiasha Diep: Burman Freestone Treating Natasia Sanko/Extender: [name], HOYT Weeks in Treatment: 10 Vital Signs Height(in): 69 Pulse(bpm): 60 Weight(lbs): 170.6 Blood Pressure(mmHg): 155/67 Body Mass Index(BMI): 25 Temperature(F): 98.3 Respiratory Rate 16 (breaths/min): Photos: [N/A:N/A] Wound Location: Sacrum - Midline N/A N/A Wounding Event: Gradually Appeared N/A N/A Primary Etiology: Pressure Ulcer N/A N/A Secondary Etiology: Abscess N/A N/A Comorbid History: Cataracts, Asthma, Angina, N/A N/A Arrhythmia, Coronary Artery Disease, Hypertension, Myocardial Infarction, Osteoarthritis, Dementia, Neuropathy Date Acquired: 10/30/2018 N/A N/A Weeks of Treatment: 10 N/A N/A Wound Status: Open N/A N/A Measurements L x W x D 2.4x0.8x1 N/A N/A (cm) Area (cm) : 1.508 N/A N/A Volume (cm) : 1.508 N/A N/A % Reduction in Area: -18750.00% N/A N/A % Reduction in Volume: -7440.00% N/A N/A Classification: Category/Stage IV N/A N/A Exudate Amount: Medium N/A N/A Exudate Type: Purulent N/A N/A Exudate Color: yellow, brown, green N/A N/A Wound Margin: Indistinct, nonvisible N/A N/A Granulation Amount: Small (1-33%) N/A N/A Granulation Quality: Pale, Hyper-granulation N/A N/A Necrotic Amount: Large (67-100%) N/A N/A Exposed Structures: Fat Layer (Subcutaneous N/A N/A Tissue) Exposed: Yes [name], [name] ([id number]) Fascia: No Tendon: No Muscle: No Joint: No Bone: No Epithelialization:  Medium (34-66%) N/A N/A Erythema Location: Circumferential N/A N/A Treatment Notes Electronic Signature(s) Signed: 06/14/2019 1:03:35 PM By: Harold Barban Entered By: Harold Barban on 02/04/2019 12:53:36 [name] ([id number]) -------------------------------------------------------------------------------- [place] Details Patient Name: [name] Date of Service: 02/04/2019 12:30 PM Medical Record Number: [id number] Patient Account Number: [id number] Date of Birth/Sex: [birth year] (85 y.o. M) Treating RN: Harold Barban Primary Care Taheem Fricke: Burman Freestone Other Clinician: Referring Zalma Channing: Burman Freestone Treating Leroy Trim/Extender: [name], HOYT Weeks  in Treatment: 10 Active Inactive Abuse / Safety / Falls / Self Care Management Nursing Diagnoses: Potential for falls Goals: Patient will not experience any injury related to falls Date Initiated: 11/20/2018 Target Resolution Date: 02/15/2019 Goal Status: Active Interventions: Assess fall risk on admission and as needed Notes: Orientation to the Wound Care Program Nursing Diagnoses: Knowledge deficit related to the wound healing center program Goals: Patient/caregiver will verbalize understanding of the [place] Program Date Initiated: 11/20/2018 Target Resolution Date: 02/15/2019 Goal Status: Active Interventions: Provide education on orientation to the wound center Notes: Wound/Skin Impairment Nursing Diagnoses: Impaired tissue integrity Goals: Ulcer/skin breakdown will heal within 14 weeks Date Initiated: 11/20/2018 Target Resolution Date: 02/15/2019 Goal Status: Active Interventions: Assess patient/caregiver ability to obtain necessary supplies [name], [name] ([id number]) Assess patient/caregiver ability to perform ulcer/skin care regimen upon admission and as needed Assess ulceration(s) every visit Notes: Electronic Signature(s) Signed: 06/14/2019 1:03:35 PM By:  Harold Barban Entered By: Harold Barban on 02/04/2019 12:53:02 [name] ([id number]) -------------------------------------------------------------------------------- Pain Assessment Details Patient Name: [name] Date of Service: 02/04/2019 12:30 PM Medical Record Number: [id number] Patient Account Number: [id number] Date of Birth/Sex: 1931-10-15 (85 y.o. M) Treating RN: Harold Barban Primary Care Leianne Callins: Burman Freestone Other Clinician: Referring Lurlene Ronda: Burman Freestone Treating Kamiya Acord/Extender: [name], HOYT Weeks in Treatment: 10 Active Problems Location of Pain Severity and Description of Pain Patient Has Paino No Site Locations Pain Management and Medication Current Pain Management: Electronic Signature(s) Signed: 02/04/2019 2:49:09 PM By: Lorine Bears RCP, RRT, CHT Signed: 06/14/2019 1:03:35 PM By: Harold Barban Entered By: Lorine Bears on 02/04/2019 12:39:55 [name] ([id number]) -------------------------------------------------------------------------------- Patient/Caregiver Education Details Patient Name: [name] Date of Service: 02/04/2019 12:30 PM Medical Record Number: [id number] Patient Account Number: [id number] Date of Birth/Gender: 30-Dec-1930 (85 y.o. M) Treating RN: Harold Barban Primary Care Physician: Burman Freestone Other Clinician: Referring Physician: Burman Freestone Treating Physician/Extender: Sharalyn Ink in Treatment: 10 Education Assessment Education Provided To: Patient Education Topics Provided Wound/Skin Impairment: Handouts: Caring for Your Ulcer Methods: Demonstration, Explain/Verbal Responses: State content correctly Electronic Signature(s) Signed: 06/14/2019 1:03:35 PM By: Harold Barban Entered By: Harold Barban on 02/04/2019 12:54:00 [name]  ([id number]) -------------------------------------------------------------------------------- Wound Assessment Details Patient Name: [name] Date of Service: 02/04/2019 12:30 PM Medical Record Number: [id number] Patient Account Number: [id number] Date of Birth/Sex: Feb 28, 1931 (85 y.o. M) Treating RN: Montey Hora Primary Care Danarius Mcconathy: Burman Freestone Other Clinician: Referring Alilah Mcmeans: Burman Freestone Treating Emmaline Wahba/Extender: [name], HOYT Weeks in Treatment: 10 Wound Status Wound Number: 2 Primary Pressure Ulcer Etiology: Wound Location: Sacrum - Midline Secondary Abscess Wounding Event: Gradually Appeared Etiology: Date Acquired: 10/30/2018 Wound Open Weeks Of Treatment: 10 Status: Clustered Wound: No Comorbid Cataracts, Asthma, Angina, Arrhythmia, History: Coronary Artery Disease, Hypertension, Myocardial Infarction, Osteoarthritis, Dementia, Neuropathy Photos Wound Measurements Length: (cm) 2.4 Width: (cm) 0.8 Depth: (cm) 1 Area: (cm) 1.508 Volume: (cm) 1.508 % Reduction in Area: -18750% % Reduction in Volume: -7440% Epithelialization: Medium (34-66%) Tunneling: No Undermining: No Wound Description Classification: Category/Stage IV Foul Odor A Wound Margin: Indistinct, nonvisible Slough/Fibr Exudate Amount: Medium Exudate Type: Purulent Exudate Color: yellow, brown, green fter Cleansing: No ino No Wound Bed Granulation Amount: Small (1-33%) Exposed Structure Granulation Quality: Pale, Hyper-granulation Fascia Exposed: No Necrotic Amount: Large (67-100%) Fat Layer (Subcutaneous Tissue) Exposed: Yes Necrotic Quality: Adherent Slough Tendon Exposed: No Muscle Exposed: No Joint Exposed: No Bone Exposed: No [name], [name] ([id number]) Periwound Skin Texture Texture Color No Abnormalities Noted:  No No Abnormalities Noted: No Callus: No Atrophie Blanche: No Crepitus: No Cyanosis: No Excoriation: No Ecchymosis: No Induration:  No Erythema: Yes Rash: No Erythema Location: Circumferential Scarring: Yes Hemosiderin Staining: No Mottled: No Moisture Pallor: No No Abnormalities Noted: No Rubor: No Dry / Scaly: No Maceration: No Temperature / Pain Temperature: No Abnormality Tenderness on Palpation: Yes Electronic Signature(s) Signed: 02/05/2019 2:55:40 PM By: Montey Hora Entered By: Montey Hora on 02/04/2019 12:48:06 [name] ([id number]) -------------------------------------------------------------------------------- Vitals Details Patient Name: [name] Date of Service: 02/04/2019 12:30 PM Medical Record Number: [id number] Patient Account Number: [id number] Date of Birth/Sex: August 26, 1931 (85 y.o. M) Treating RN: Harold Barban Primary Care Mohd Clemons: Burman Freestone Other Clinician: Referring Denicia Pagliarulo: Burman Freestone Treating Beck Cofer/Extender: [name], HOYT Weeks in Treatment: 10 Vital Signs Time Taken: 12:40 Temperature (F): 98.3 Height (in): 69 Pulse (bpm): 60 Weight (lbs): 170.6 Respiratory Rate (breaths/min): 16 Body Mass Index (BMI): 25.2 Blood Pressure (mmHg): 155/67 Reference Range: 80 - 120 mg / dl Electronic Signature(s) Signed: 02/04/2019 2:49:09 PM By: Lorine Bears RCP, RRT, CHT Entered By: Lorine Bears on 02/04/2019 12:40:23

## 2019-02-11 ENCOUNTER — Encounter: Payer: Medicare Other | Admitting: Physician Assistant

## 2019-02-11 ENCOUNTER — Other Ambulatory Visit: Payer: Self-pay

## 2019-02-11 DIAGNOSIS — L89154 Pressure ulcer of sacral region, stage 4: Secondary | ICD-10-CM | POA: Diagnosis not present

## 2019-02-11 NOTE — Progress Notes (Signed)
ISHAQ, MAFFEI ([id number]) Visit Report for 02/11/2019 Arrival Information Details Patient Name: [name], [name]. Date of Service: 02/11/2019 1:45 PM Medical Record Number: [id number] Patient Account Number: [id number] Date of Birth/Sex: 03-May-1931 (85 y.o. M) Treating RN: Harold Barban Primary Care Tru Leopard: Burman Freestone Other Clinician: Referring Mecca Guitron: Burman Freestone Treating Adrie Picking/Extender: [name], HOYT Weeks in Treatment: 11 Visit Information History Since Last Visit Added or deleted any medications: No Patient Arrived: Ambulatory Any new allergies or adverse reactions: No Arrival Time: 13:58 Had a fall or experienced change in No Accompanied By: wife, daughter activities of daily living that may affect Transfer Assistance: None risk of falls: Patient Identification Verified: Yes Signs or symptoms of abuse/neglect since last visito No Secondary Verification Process Yes Hospitalized since last visit: No Completed: Implantable device outside of the clinic excluding No Patient Has Alerts: Yes cellular tissue based products placed in the center Patient Alerts: Patient on Blood since last visit: Thinner Has Dressing in Place as Prescribed: Yes Pain Present Now: No Electronic Signature(s) Signed: 02/11/2019 3:31:41 PM By: Lorine Bears RCP, RRT, CHT Entered By: Becky Sax, Amado Nash on 02/11/2019 13:59:46 [name] ([id number]) -------------------------------------------------------------------------------- Lower Extremity Assessment Details Patient Name: [name] Date of Service: 02/11/2019 1:45 PM Medical Record Number: [id number] Patient Account Number: [id number] Date of Birth/Sex: 1930-12-09 (85 y.o. M) Treating RN: Montey Hora Primary Care Jaydis Duchene: Burman Freestone Other Clinician: Referring Brodyn Depuy: Burman Freestone Treating Sinan Tuch/Extender: [name], HOYT Weeks in Treatment: 11 Electronic  Signature(s) Signed: 02/11/2019 4:13:42 PM By: Montey Hora Entered By: Montey Hora on 02/11/2019 14:04:10 [name] ([id number]) -------------------------------------------------------------------------------- Multi Wound Chart Details Patient Name: [name] Date of Service: 02/11/2019 1:45 PM Medical Record Number: [id number] Patient Account Number: [id number] Date of Birth/Sex: [birth year] (85 y.o. M) Treating RN: Harold Barban Primary Care Zayd Bonet: Burman Freestone Other Clinician: Referring Nick Armel: Burman Freestone Treating Chanetta Moosman/Extender: [name], HOYT Weeks in Treatment: 11 Vital Signs Height(in): 69 Pulse(bpm): 84 Weight(lbs): 170.6 Blood Pressure(mmHg): 152/68 Body Mass Index(BMI): 25 Temperature(F): 98.3 Respiratory Rate 16 (breaths/min): Photos: [2:No Photos] [N/A:N/A] Wound Location: [2:Sacrum - Midline] [N/A:N/A] Wounding Event: [2:Gradually Appeared] [N/A:N/A] Primary Etiology: [2:Pressure Ulcer] [N/A:N/A] Secondary Etiology: [2:Abscess] [N/A:N/A] Comorbid History: [2:Cataracts, Asthma, Angina, Arrhythmia, Coronary Artery Disease, Hypertension, Myocardial Infarction, Osteoarthritis, Dementia, Neuropathy] [N/A:N/A] Date Acquired: [2:10/30/2018] [N/A:N/A] Weeks of Treatment: [2:11] [N/A:N/A] Wound Status: [2:Open] [N/A:N/A] Measurements L x W x D [2:2.3x0.5x0.5] [N/A:N/A] (cm) Area (cm) : [2:0.903] [N/A:N/A] Volume (cm) : [2:0.452] [N/A:N/A] % Reduction in Area: [2:-11187.50%] [N/A:N/A] % Reduction in Volume: [2:-2160.00%] [N/A:N/A] Classification: [2:Category/Stage IV] [N/A:N/A] Exudate Amount: [2:Medium] [N/A:N/A] Exudate Type: [2:Purulent] [N/A:N/A] Exudate Color: [2:yellow, brown, green] [N/A:N/A] Wound Margin: [2:Indistinct, nonvisible] [N/A:N/A] Granulation Amount: [2:Medium (34-66%)] [N/A:N/A] Granulation Quality: [2:Pale, Hyper-granulation] [N/A:N/A] Necrotic Amount: [2:Medium (34-66%)] [N/A:N/A] Exposed  Structures: [2:Fat Layer (Subcutaneous Tissue) Exposed: Yes Fascia: No Tendon: No Muscle: No Joint: No Bone: No] [N/A:N/A] Epithelialization: [2:Medium (34-66%)] [N/A:N/A] Periwound Skin Texture: [N/A:N/A] Scarring: Yes Excoriation: No Induration: No Callus: No Crepitus: No Rash: No Periwound Skin Moisture: Maceration: No N/A N/A Dry/Scaly: No Periwound Skin Color: Erythema: Yes N/A N/A Atrophie Blanche: No Cyanosis: No Ecchymosis: No Hemosiderin Staining: No Mottled: No Pallor: No Rubor: No Erythema Location: Circumferential N/A N/A Temperature: No Abnormality N/A N/A Tenderness on Palpation: Yes N/A N/A Treatment Notes Electronic Signature(s) Signed: 02/11/2019 4:10:42 PM By: Harold Barban Entered By: Harold Barban on 02/11/2019 14:21:38 [name] ([id number]) -------------------------------------------------------------------------------- [place] Details Patient Name: [name] Date of Service: 02/11/2019 1:45 PM Medical  Record Number: [id number] Patient Account Number: [id number] Date of Birth/Sex: 11-25-1930 (85 y.o. M) Treating RN: Harold Barban Primary Care Ahna Konkle: Burman Freestone Other Clinician: Referring Liel Rudden: Burman Freestone Treating Mayleigh Tetrault/Extender: [name], HOYT Weeks in Treatment: 11 Active Inactive Abuse / Safety / Falls / Self Care Management Nursing Diagnoses: Potential for falls Goals: Patient will not experience any injury related to falls Date Initiated: 11/20/2018 Target Resolution Date: 02/15/2019 Goal Status: Active Interventions: Assess fall risk on admission and as needed Notes: Orientation to the Wound Care Program Nursing Diagnoses: Knowledge deficit related to the wound healing center program Goals: Patient/caregiver will verbalize understanding of the [place] Program Date Initiated: 11/20/2018 Target Resolution Date: 02/15/2019 Goal Status: Active Interventions: Provide  education on orientation to the wound center Notes: Wound/Skin Impairment Nursing Diagnoses: Impaired tissue integrity Goals: Ulcer/skin breakdown will heal within 14 weeks Date Initiated: 11/20/2018 Target Resolution Date: 02/15/2019 Goal Status: Active Interventions: Assess patient/caregiver ability to obtain necessary supplies [name], [name] ([id number]) Assess patient/caregiver ability to perform ulcer/skin care regimen upon admission and as needed Assess ulceration(s) every visit Notes: Electronic Signature(s) Signed: 02/11/2019 4:10:42 PM By: Harold Barban Entered By: Harold Barban on 02/11/2019 14:21:31 [name] ([id number]) -------------------------------------------------------------------------------- Pain Assessment Details Patient Name: [name] Date of Service: 02/11/2019 1:45 PM Medical Record Number: [id number] Patient Account Number: [id number] Date of Birth/Sex: [birth year] (84 y.o. M) Treating RN: Harold Barban Primary Care Amylia Collazos: Burman Freestone Other Clinician: Referring Aracelia Brinson: Burman Freestone Treating Mylisa Brunson/Extender: [name], HOYT Weeks in Treatment: 11 Active Problems Location of Pain Severity and Description of Pain Patient Has Paino No Site Locations Pain Management and Medication Current Pain Management: Electronic Signature(s) Signed: 02/11/2019 3:31:41 PM By: Lorine Bears RCP, RRT, CHT Signed: 02/11/2019 4:10:42 PM By: Harold Barban Entered By: Lorine Bears on 02/11/2019 13:59:55 [name] ([id number]) -------------------------------------------------------------------------------- Patient/Caregiver Education Details Patient Name: [name] Date of Service: 02/11/2019 1:45 PM Medical Record Number: [id number] Patient Account Number: [id number] Date of Birth/Gender: [birth year] (85 y.o. M) Treating RN: Harold Barban Primary Care Physician: Burman Freestone Other  Clinician: Referring Physician: Burman Freestone Treating Physician/Extender: Sharalyn Ink in Treatment: 11 Education Assessment Education Provided To: Patient Education Topics Provided Wound/Skin Impairment: Handouts: Caring for Your Ulcer Methods: Demonstration, Explain/Verbal Responses: State content correctly Electronic Signature(s) Signed: 02/11/2019 4:10:42 PM By: Harold Barban Entered By: Harold Barban on 02/11/2019 14:21:57 [name] ([id number]) -------------------------------------------------------------------------------- Wound Assessment Details Patient Name: [name] Date of Service: 02/11/2019 1:45 PM Medical Record Number: [id number] Patient Account Number: [id number] Date of Birth/Sex: Apr 07, 1931 (84 y.o. M) Treating RN: Montey Hora Primary Care Hinda Lindor: Burman Freestone Other Clinician: Referring Saquan Furtick: Burman Freestone Treating Cyan Clippinger/Extender: [name], HOYT Weeks in Treatment: 11 Wound Status Wound Number: 2 Primary Pressure Ulcer Etiology: Wound Location: Sacrum - Midline Secondary Abscess Wounding Event: Gradually Appeared Etiology: Date Acquired: 10/30/2018 Wound Open Weeks Of Treatment: 11 Status: Clustered Wound: No Comorbid Cataracts, Asthma, Angina, Arrhythmia, History: Coronary Artery Disease, Hypertension, Myocardial Infarction, Osteoarthritis, Dementia, Neuropathy Wound Measurements Length: (cm) 2.3 Width: (cm) 0.5 Depth: (cm) 0.5 Area: (cm) 0.903 Volume: (cm) 0.452 % Reduction in Area: -11187.5% % Reduction in Volume: -2160% Epithelialization: Medium (34-66%) Tunneling: No Undermining: No Wound Description Classification: Category/Stage IV Wound Margin: Indistinct, nonvisible Exudate Amount: Medium Exudate Type: Purulent Exudate Color: yellow, brown, green Foul Odor After Cleansing: No Slough/Fibrino No Wound Bed Granulation Amount: Medium (34-66%) Exposed Structure Granulation  Quality: Pale, Hyper-granulation Fascia Exposed: No Necrotic Amount: Medium (34-66%) Fat  Layer (Subcutaneous Tissue) Exposed: Yes Necrotic Quality: Adherent Slough Tendon Exposed: No Muscle Exposed: No Joint Exposed: No Bone Exposed: No Periwound Skin Texture Texture Color No Abnormalities Noted: No No Abnormalities Noted: No Callus: No Atrophie Blanche: No Crepitus: No Cyanosis: No Excoriation: No Ecchymosis: No Induration: No Erythema: Yes Rash: No Erythema Location: Circumferential Scarring: Yes Hemosiderin Staining: No Mottled: No Moisture Pallor: No [name], [name] ([id number]) No Abnormalities Noted: No Rubor: No Dry / Scaly: No Temperature / Pain Maceration: No Temperature: No Abnormality Tenderness on Palpation: Yes Electronic Signature(s) Signed: 02/11/2019 4:13:42 PM By: Montey Hora Entered By: Montey Hora on 02/11/2019 14:09:21 [name] ([id number]) -------------------------------------------------------------------------------- Vitals Details Patient Name: [name] Date of Service: 02/11/2019 1:45 PM Medical Record Number: [id number] Patient Account Number: [id number] Date of Birth/Sex: 06/20/1931 (85 y.o. M) Treating RN: Harold Barban Primary Care Leah Skora: Burman Freestone Other Clinician: Referring Karlin Binion: Burman Freestone Treating Lourie Retz/Extender: [name], HOYT Weeks in Treatment: 11 Vital Signs Time Taken: 14:00 Temperature (F): 98.3 Height (in): 69 Pulse (bpm): 59 Weight (lbs): 170.6 Respiratory Rate (breaths/min): 16 Body Mass Index (BMI): 25.2 Blood Pressure (mmHg): 152/68 Reference Range: 80 - 120 mg / dl Electronic Signature(s) Signed: 02/11/2019 3:31:41 PM By: Lorine Bears RCP, RRT, CHT Entered By: Lorine Bears on 02/11/2019 14:00:27

## 2019-02-11 NOTE — Progress Notes (Signed)
ALGER, KERSTEIN ([id number]) Visit Report for 02/11/2019 Chief Complaint Document Details Patient Name: [name]. Date of Service: 02/11/2019 1:45 PM Medical Record Number: [id number] Patient Account Number: [id number] Date of Birth/Sex: 07/03/1931 (85 y.o. M) Treating RN: Harold Barban Primary Care Provider: Burman Freestone Other Clinician: Referring Provider: Burman Freestone Treating Provider/Extender: [name], HOYT Weeks in Treatment: 11 Information Obtained from: Patient Chief Complaint Sacral pressure ulcer Electronic Signature(s) Signed: 02/11/2019 5:07:11 PM By: Worthy Keeler PA-C Entered By: Worthy Keeler on 02/11/2019 13:56:04 [name] ([id number]) -------------------------------------------------------------------------------- HPI Details Patient Name: [name] Date of Service: 02/11/2019 1:45 PM Medical Record Number: [id number] Patient Account Number: [id number] Date of Birth/Sex: Mar 24, 1931 (85 y.o. M) Treating RN: Harold Barban Primary Care Provider: Burman Freestone Other Clinician: Referring Provider: Burman Freestone Treating Provider/Extender: [name], HOYT Weeks in Treatment: 11 History of Present Illness HPI Description: 02/06/18 on evaluation today patient presents for initial evaluation and our clinic concerning an issue which began roughly 3 weeks ago when the patient fell in his home on the floor in his kitchen and laid him down this detergent for roughly 3 days. He had a pressure injury to the left shoulder. This unfortunately has caused him a lot of discomfort although it finally seems to be doing better if anything is really having a lot of itching right now. This appears potentially be a contact dermatitis issue. He also has a significant pressure injury to the sacrum at this time as well which is also showing fascia exposure right over the bone but no evidence of bone exposure at this point which is good news. They have  been using Santyl as well as Saline soaked gauze at this point in time. There does appear to be a lot of necrotic slough in the base of the wound. He does have a history of incontinence, myocardial infarction, and hypertension. He also is "borderline diabetic" hemoglobin A1c of 6.0. Currently he has some discomfort in the pressure site at the sacrum but fortunately nothing too significant this did require sharp debridement today. 02/13/18 on evaluation today patient appears to be doing much better in regard to his sacral wound. He has been tolerating the dressing changes without complication with the Vashe. Fortunately there is no evidence of infection and though there is some Slough on the surface of the wound bed he has excellent granulation noted. Overall I'm pleased with how things have progressed in that regard. A glance at his shoulder as well and the rash seems to be someone improving in my pinion at this site as well. Overall I am pleased with what we're seeing and so is the family. 02/20/18 on evaluation today patient appears to be doing a little worse in regard to the sacral wound only in the fact that there is redness surrounding it has me somewhat concerned for infection. The drainage has also apparently been a little bit off color compared to normal according to family they did keep the dressing today that was removed to show me and I agree this seems to be a little bit different compared to what we have been seeing. Coupled with the redness I'm concerned he may be developing some cellulitis surrounding the wound bed. 02/27/18 on evaluation today patient presents for follow-up concerning his sacral ulcer. We have received the results back from his wound culture which shows unfortunately that the doxycycline will not be of benefit for him I am going to need to initiate treatment with something else  in order to treat the pseudomonas. Otherwise he does not seem to be having any  significant pain although his daughter states there are sometimes when he states having pain. We continue to use the Vashe currently. 03/06/18 on evaluation today patient's sacral wound appears to be doing better in my opinion. He has been tolerating the dressing changes without complication. With that being said the silver nitrate has helped with the prominent area of hyper granulation at the 6 o'clock location we will likely need to repeat this again today. Nonetheless overall I am pleased with how things have improved over the last week. The erythema surrounding the wound seems to be greatly improved. 03/13/18 on evaluation today patient's wound actually does not appear to be terribly infected although she does continue to have erythema surrounding the wound bed especially on the left border. I'm still somewhat concerned about the fact that the oral antibiotics alone may not be completely treating his infection. I previously discussed may need to go for IV antibiotic therapy I'm concerned that may be the case. We will need to make a referral today for infectious disease. 03/20/18 on evaluation today the patient sacral wound actually appears to be doing fairly well in regard to granulation although he continues unfortunately to have it your theme is surrounding the periwound region. There's also some increased swelling at the 6 o'clock location which also has me somewhat concerned. With that being said he does have some discomfort but nothing too significant at this point. He still has not heard from infectious disease his daughter and wife are both present during the office visit today they're going to check back with this again. We did get the information for them to call them today. 03/27/18 on evaluation today patient is seen concerning his ongoing sacral ulcer. He has been tolerating the dressing changes without complication. With that being said he does present with evidence of bright green  drainage noted on the dressing which again is something that I do often expect to see with a pseudomonas infection. He continues to have your theme is SHIGERU, LAMPERT ([id number]) surrounding the wound bed as well and again I'm not 100% convinced this is just pressure related. I did speak with Colletta Maryland who is the nurse practitioner in [place] with infectious disease. I spoke with her actually yesterday concerning this patient. She is not 100% convinced that this is infected. She question whether the wound may just be colonized with Pseudomonas and not actually causing an active infection. I am really not thinking that the edema is associated with pressure alone and again overall I don't feel that your theme and is consistent with a pressure injury either as he's never had any contusions noted like a deep tissue injury on his heel which was new and I did visualize today this was on the left heel. Nonetheless she wanted to give this a little bit more time and thought it would be appropriate to start the Wound VAC at this point. 04/03/18 on evaluation today patient sacral ulcer actually appears to be doing fairly well at this point. He has been tolerating the dressing changes without complication. With that being said I'm very pleased with the progress that has been made in regard to his sacral wound over the past week I do not see as much in the way of erythema which is great news. Nonetheless he does have a small area of hyper granulation unfortunately. This is at roughly the 7 o'clock location and I  think does need to be addressed so that this will heal more appropriately. Nonetheless I think we may be ready to go ahead and initiate therapy with the Wound VAC. 04/10/18 on evaluation today patient appears to be doing excellent in regard to his sacral ulcer. The show signs of great improvement in overall I'm very pleased with how things look. He has been tolerating the dressing changes  without complication. Specifically this is the Wound VAC. He also seems to be doing well with the antibiotic there is decreased your theme and redness surrounding the sacral area at this point in the wound has filled in quite significantly. 04/17/18 on evaluation today patient actually appears to be doing excellent in regard to his sacral ulcer. He's been tolerating the dressing changes without complication specifically the Wound VAC. There really are no major concerns from the patient nor family this point he is having no pain. He does have a little bit of Epiboly on the lateral portions of the wound where he does have a little bit more depth that will need to be addressed today. 04/23/18 on evaluation today patient's wound actually appears to be doing excellent at this point. He has been tolerating the Wound VAC and this appears to be doing well other than the fact that it seems to be breaking seal at the 6 o'clock location. I do believe that adding a duodenum dressing at this location try and help maintain the seal would be appropriate and likely very effective. With that being said he overall seems to be showing signs of good improvement at this point. There does not appear to be any evidence of significant infection which is also excellent news. 04/30/18 on evaluation today patient actually appears to be doing well in regard to his sacral ulcer. He's been tolerating the dressing changes without complication. Fortunately there does not appear to be any evidence of infection. Overall I'm very pleased with the progress that has been made up to this point. He does have some blistering underneath the draping unfortunately although this is definitely something that has been noted on other patients previously is a fairly common occurrence. Nonetheless the patient seems to be doing fairly well in general in my pinion based on what I see at this time. I do believe these are fairly superficial and  minor. 05/07/18 on evaluation today patient's wound actually appears to be doing excellent at this point in time. He has been tolerating the Wound VAC decently well he states that it is somewhat cumbersome to carry around unfortunately. The only other issue he's been having according to family is that they been having a difficult time keeping the Wound VAC in place and doing what is supposed to do without making. Obviously I do think that this is definitely of concern. Nonetheless I do believe she's made good progress up to this point. I'm very happy in that regard. 05/14/18 on evaluation today patient's wound continues to make good progress at this point. He had a minimal amount of slough noted on the surface which was easily wiped away with saline and gauze and in general I feel like that he is continuing to show excellent progress even with the discontinuation of the Wound VAC. Overall I'm pleased in this regard. He was having issues with the Wound VAC in getting it to seal I think that using the Prisma at this time has been equally efficient and getting the wound to diminish in size. 05/29/18- He is here in follow up  evaluation for a sacral ulcer. There is improvement, we will continue with prisma and he will follow up in two weeks 06/11/18 on evaluation today patient had unfortunately bright green drainage on the dressing upon evaluation today. This is something that we have encountered before although we were able to get things under control previously with antibiotics. With that being said currently upon further inspection of the three areas of hyper granulation that were separate from the actual wound itself which was almost healed it really appears that these all have some depth to them. They are more tunnels that really have not closed or at least have reopened as a result likely of infection in my pinion. This is definitely not what I was expecting or hoping for. PHAROAH, GOGGINS  ([id number]) 06/26/18 on evaluation today patient continues to experience issues with what appears to be small abscesses in the sacral region unfortunately. With that being said he has been tolerating the dressing changes without complication which is good news. He's not having any significant discomfort which is also good news. With that being said his daughter states that after he left last week that the packing that we have placed fell out quite rapidly and he subsequently healed over very quick to the point they were not able to even repack the regions. Nonetheless there appear to be several fluctuance areas noted at this point there's one central region that does seem to be draining still discharge that is somewhat green in color. I did review the results of the wound culture which did show evidence of infection with both MRSA as well as pseudomonas based on that result. Nonetheless again with his other current medications we are not able to do the Cipro due to issues with potential long QT syndrome. I am going to give him a prescription for doxycycline in order to help with the potential MRSA infection. 07/09/18 on evaluation today patient actually appears to be doing about the same in regard to his sacral wound. He actually has his MRI scheduled for tomorrow and then subsequently is going to be having his infectious disease appointment for Thursday of this week. Fortunately he's not having any significant discomfort he still has your theme in the sacral region he still has several blister/flux went areas although they technically are not blisters this is more like a underlying abscess. The one area that is open still does probe down to bone. Again I am concerned about a deeper infection possibly even sacral osteomyelitis. 07/23/18 on evaluation today patient actually appears to be doing very well all things considered in regard to his sacral ulcer. Since I've last seen him he actually did have  his MRI performed which showed that he has a complex fluid collection superficial to the distal sacrum measuring 5.9 x 4.4 x 2.8 cm which abuts the posterior aspect of the distal sacrum and the sacrum itself shows cortical destruction consistent with osteomyelitis. She has also been seen by infectious disease and currently orders have been initiated for IV antibiotic therapy for the next eight weeks. He was seen by Janene Madeira NP and placed on Ceftazidime and Daptomycin. Currently he has not really been on this quite long enough to see a sufficient response to the new orders as far as antibiotic therapy is concerned. Nonetheless it does appear that he is likely on the right track at this point which is great news. Nonetheless the question which both Colletta Maryland and myself have discussed both with the patient and between  ourselves is whether or not the patient may need to be seen by surgeon for surgical evacuation of the fluid collection/abscess and possible debridement of the sacrum itself. Nonetheless at this time my personal opinion is probably gonna be that we wait and get this at least a couple weeks to see the response that he receives with the IV antibiotic therapy. 08/06/18 on evaluation today patient actually appears to be doing rather well in regard to his sacral ulcer region all things considered. He has been tolerating the dressing changes without complication. With that being said there is not any obvious opening nor any drainage noted at this point in time upon evaluation. The patient has been tolerating the dressing changes though. He's doing well with the IV antibiotic therapy. 08/20/18 on evaluation today patient appears to be doing wonderful in regard to his sacral region. In fact there appears to be no wound opening or drainage at this time. Overall I'm very pleased with how things have progressed. The patient likewise as well as his wife and daughter are very happy as  well. Readmission: 11/20/18 on evaluation today patient presents for reevaluation our clinic concerning issues with his sacral region. Unfortunately the area in question is the same region which we previously treated what we thought would successfully back in November 2019. At that time the patient underwent IV antibiotic therapy which seemed to do the job very well. Subsequently however in the past couple of weeks he has begun to have drainage and bleeding from the sacral region and is having increased pain yet again. Fortunately there is no signs of systemic infection although it does appear that the complex abscess that was previously noted may not have fully cleared there was a question between myself as well is infectious disease previous whether not he needed to see a surgeon being that things got better the family opted not to see a surgeon at that time. Nonetheless I am concerned that he may the need to see a surgeon in order to have this area surgically debrided and possibly a bone culture obtained in order to get a better idea of what we're treating and ensure that this is able to completely and fully heal. 11/27/18 on evaluation today patient appears to be doing about the same in regard to his sacral ulcer. He did see Dr. Lysle Pearl yesterday and the plan is to proceed forward with surgery to clean out the region. This seems to be the most appropriate goal of treatment at this point. Dr. Lysle Pearl just needed to speak with Dr. Ellene Route the patient's neurologist in order to get clearance as the patient was supposed to be scheduled for a carpal tunnel and elbow surgery with Dr. Ellene Route. Nonetheless he has apparently given clearance to proceed with this surgery for the sacral region which is deemed more important at this point. Unfortunately the patient had a little bit of increased pain although he is having redness at this point there does not appear to be any spreading infection which is good news. No  fevers, chills, nausea, or vomiting noted at this time. LYAL, HUSTED ([id number]) 12/03/18 on evaluation today patient actually appears to be doing about the same in regard to the sacral ulcer. He still waiting to hear back from Dr. Ines Bloomer office in regard to scheduling his surgery. Fortunately there's no signs of systemic infection at this time which is good news. Unfortunately he really does not seem to making any progress the doxycycline does seem to at least  be beneficial in helping to hold off the infection from worsening although unfortunately he still has a lot going on in this regard. Patient's daughter states she's actually gonna contact her office tomorrow to see what exactly is going on. 12/10/18 on evaluation today patient actually appears to be doing about the same in regard to her sacral room. Apparently he still waiting on approval from both cardiology as well as Dr. Ellene Route although apparently he's Artie gotten a formal letter from Dr. Ellene Route stated that he was clear to proceed with the sacral surgery. Di Kindle actually found the clearance letter from cardiology in epic as well today and this subsequently was given to the patient's daughter as that seems to be the only thing that was holding up proceeding with the surgery. Hopefully should be able to take that over to the surgeon's office today in order to go ahead and get this scheduled. 12/24/18 on evaluation today patient actually appears to be doing very well in regard to his sacral ulcer compared to when I last saw him. He has had surgery Dr. Lysle Pearl and it does appear that he was able to clear out this area of abscess very well without complication. This did extend down to the bone and a portion of the bone was sent for pathology and culture. Although on the pathology report it appears this is more tissue and not bone noted. With that being said the culture from the bone and Henrene Pastor asked him this was obtained revealed  Corynebacterium striatum with no anaerobes isolated. Dr. Lysle Pearl did not place the patient on the antibiotics at this point. Again I have been debating on whether or not this is the patient to infectious disease I think that I am going to do that at this point to gain their opinion on whether or not there's anything we should be treated in this regard and if so will be the best treatment options. 12/31/18 on evaluation today patient actually appears to be doing okay in regard to his sacral wound. Fortunately there does not appear to be any evidence of infection at this time which is good news. He has been tolerating the dressing's without complication. With that being said he did see infectious disease and apparently they realize that the pathology samples sent from the operating room was actually. I'll see him and not bone and the question is whether the culture and sample or just contamination from the skin of not guilty and active infection. The daughter is very concerned about this she states that we may need to get a piece of bone to send for examination to ensure there's nothing more severe going on here. Obviously with this history of healing and then obsesses forming and then having to go for surgery now and reopening everything they have a reason to be concerned I completely agree. 01/07/19 on evaluation today patient actually appears to be doing well in regard to his sacral wound. Again we did get results back of his bone culture as well as the pathology which showed no evidence of osteomyelitis at this point this is excellent news. Overall I think that he likely does not need to go forward with the MRI since everything seems to be showing negative at this time. Especially in light of the fact that the wound appears to be doing well and that the infection and everything was running the wound bed seems to be dramatically improved. Patient's daughter is in agreement with this plan. 01/14/19 on  evaluation today  patient appears to be doing rather well at this time as far as the overall appearance of the sacral wound is concerned. The depth is slightly improved he has some granulation tissue noted there still is bone in the very bottom of the wound bed although I'm no longer able to palpate this with my finger I can only tell by probing with the sterile cotton swab. Nonetheless I do believe this is shown signs of good improvement in healing as far as what I'm seeing at this point. 01/22/19 on evaluation today patient's wound in the sacral region appears to be doing rather well. Fortunately there does not seem to be any evidence of active infection at this time which is good news but patient overall has been doing excellent currently. I'm very pleased with how things have progressed over the past week. 01/28/19 on evaluation today patient actually appears to be doing very well in regard to his sacral wound. The area where the bone was exposed is closing in on becoming much more solid which is excellent news there does not appear to be any signs of active infection at this time also good news. Overall very pleased with how everything seems to be progressing. 02/04/19 on evaluation today patient appears to be doing much better in regard to the sacral wound. He has been tolerating the dressing changes without complication and though he is making some progress there's actually some hyper granulation noted at this point. I do think he may benefit from a dressing change we been using the Prisma with the alginate pack behind for some time I think we may want to switch to [facility] Dressing to see if this could be of benefit. 02/11/19 on evaluation today patient appears to be doing well in regard to his sacral wound. There does not appear to be any signs of active infection which is good news. There is some hyper granular tissue which we are attempting to address little by little with the [facility] Dressing although I do believe that many to try silver nitrate today as well. RASHID, WHITENIGHT ([id number]) Electronic Signature(s) Signed: 02/11/2019 5:07:11 PM By: Worthy Keeler PA-C Entered By: Worthy Keeler on 02/11/2019 15:34:55 Preet, Perrier [name] ([id number]) -------------------------------------------------------------------------------- Otelia Sergeant TISS Details Patient Name: PRANIT, OWENSBY Date of Service: 02/11/2019 1:45 PM Medical Record Number: [id number] Patient Account Number: [id number] Date of Birth/Sex: 06-15-1931 (85 y.o. M) Treating RN: Harold Barban Primary Care Provider: Burman Freestone Other Clinician: Referring Provider: Burman Freestone Treating Provider/Extender: [name], HOYT Weeks in Treatment: 11 Procedure Performed for: Wound #2 Midline Sacrum Performed By: Physician Emilio Math., PA-C Post Procedure Diagnosis Same as Pre-procedure Electronic Signature(s) Signed: 02/11/2019 4:10:42 PM By: Harold Barban Entered By: Harold Barban on 02/11/2019 14:23:37 [name] ([id number]) -------------------------------------------------------------------------------- Physical Exam Details Patient Name: RAVON, MORTELLARO Date of Service: 02/11/2019 1:45 PM Medical Record Number: [id number] Patient Account Number: [id number] Date of Birth/Sex: 1931-07-16 (85 y.o. M) Treating RN: Harold Barban Primary Care Provider: Burman Freestone Other Clinician: Referring Provider: Burman Freestone Treating Provider/Extender: STONE III, HOYT Weeks in Treatment: 88 Constitutional Well-nourished and well-hydrated in no acute distress. Respiratory normal breathing without difficulty. Psychiatric this patient is able to make decisions and demonstrates good insight into disease process. Alert and Oriented x 3. pleasant and cooperative. Notes Patient's wound bed did require mechanical debridement with saline and gauze which he tolerated without  complication. Subsequently post mechanical debridement I did go ahead and use silver  nitrate to chemically cauterize some of the hyper granular region on the proximal portion of the wound. He tolerated this without any pain. Electronic Signature(s) Signed: 02/11/2019 5:07:11 PM By: Worthy Keeler PA-C Entered By: Worthy Keeler on 02/11/2019 15:35:26 Collison, [name] ([id number]) -------------------------------------------------------------------------------- Physician Orders Details Patient Name: JALEAL, SCHLIEP Date of Service: 02/11/2019 1:45 PM Medical Record Number: [id number] Patient Account Number: [id number] Date of Birth/Sex: 1931-08-21 (85 y.o. M) Treating RN: Harold Barban Primary Care Provider: Burman Freestone Other Clinician: Referring Provider: Burman Freestone Treating Provider/Extender: [name], HOYT Weeks in Treatment: 11 Verbal / Phone Orders: No Diagnosis Coding ICD-10 Coding Code Description L89.154 Pressure ulcer of sacral region, stage 4 L02.31 Cutaneous abscess of buttock Wound Cleansing Wound #2 Midline Sacrum o Clean wound with Normal Saline. o May Shower, gently pat wound dry prior to applying new dressing. Anesthetic (add to Medication List) Wound #2 Midline Sacrum o Topical Lidocaine 4% cream applied to wound bed prior to debridement (In Clinic Only). Primary Wound Dressing Wound #2 Midline Sacrum o Hydrafera Blue Ready Transfer Secondary Dressing Wound #2 Midline Sacrum o Boardered Foam Dressing Dressing Change Frequency Wound #2 Midline Sacrum o Change dressing every other day. Follow-up Appointments Wound #2 Midline Sacrum o Return Appointment in 1 week. Off-Loading Wound #2 Midline Sacrum o Turn and reposition every 2 hours Electronic Signature(s) Signed: 02/11/2019 4:10:42 PM By: Harold Barban Signed: 02/11/2019 5:07:11 PM By: Worthy Keeler PA-C Entered By: Harold Barban on 02/11/2019 14:29:20 MASAYUKI, SAKAI ([id number]) DEONTEZ, KLINKE ([id number]) -------------------------------------------------------------------------------- Problem List Details Patient Name: TALI, COSTER Date of Service: 02/11/2019 1:45 PM Medical Record Number: [id number] Patient Account Number: [id number] Date of Birth/Sex: 12-13-1930 (85 y.o. M) Treating RN: Harold Barban Primary Care Provider: Burman Freestone Other Clinician: Referring Provider: Burman Freestone Treating Provider/Extender: [name], HOYT Weeks in Treatment: 11 Active Problems ICD-10 Evaluated Encounter Code Description Active Date Today Diagnosis L89.154 Pressure ulcer of sacral region, stage 4 11/20/2018 No Yes L02.31 Cutaneous abscess of buttock 11/20/2018 No Yes Inactive Problems Resolved Problems Electronic Signature(s) Signed: 02/11/2019 5:07:11 PM By: Worthy Keeler PA-C Entered By: Worthy Keeler on 02/11/2019 13:55:57 Timko, [name] ([id number]) -------------------------------------------------------------------------------- Progress Note Details Patient Name: [name] Date of Service: 02/11/2019 1:45 PM Medical Record Number: [id number] Patient Account Number: [id number] Date of Birth/Sex: 12-Jun-1931 (85 y.o. M) Treating RN: Harold Barban Primary Care Provider: Burman Freestone Other Clinician: Referring Provider: Burman Freestone Treating Provider/Extender: [name], HOYT Weeks in Treatment: 11 Subjective Chief Complaint Information obtained from Patient Sacral pressure ulcer History of Present Illness (HPI) 02/06/18 on evaluation today patient presents for initial evaluation and our clinic concerning an issue which began roughly 3 weeks ago when the patient fell in his home on the floor in his kitchen and laid him down this detergent for roughly 3 days. He had a pressure injury to the left shoulder. This unfortunately has caused him a lot of discomfort although it finally seems to be doing better if  anything is really having a lot of itching right now. This appears potentially be a contact dermatitis issue. He also has a significant pressure injury to the sacrum at this time as well which is also showing fascia exposure right over the bone but no evidence of bone exposure at this point which is good news. They have been using Santyl as well as Saline soaked gauze at this point in time. There does appear to be a lot of necrotic slough in  the base of the wound. He does have a history of incontinence, myocardial infarction, and hypertension. He also is "borderline diabetic" hemoglobin A1c of 6.0. Currently he has some discomfort in the pressure site at the sacrum but fortunately nothing too significant this did require sharp debridement today. 02/13/18 on evaluation today patient appears to be doing much better in regard to his sacral wound. He has been tolerating the dressing changes without complication with the Vashe. Fortunately there is no evidence of infection and though there is some Slough on the surface of the wound bed he has excellent granulation noted. Overall I'm pleased with how things have progressed in that regard. A glance at his shoulder as well and the rash seems to be someone improving in my pinion at this site as well. Overall I am pleased with what we're seeing and so is the family. 02/20/18 on evaluation today patient appears to be doing a little worse in regard to the sacral wound only in the fact that there is redness surrounding it has me somewhat concerned for infection. The drainage has also apparently been a little bit off color compared to normal according to family they did keep the dressing today that was removed to show me and I agree this seems to be a little bit different compared to what we have been seeing. Coupled with the redness I'm concerned he may be developing some cellulitis surrounding the wound bed. 02/27/18 on evaluation today patient presents for  follow-up concerning his sacral ulcer. We have received the results back from his wound culture which shows unfortunately that the doxycycline will not be of benefit for him I am going to need to initiate treatment with something else in order to treat the pseudomonas. Otherwise he does not seem to be having any significant pain although his daughter states there are sometimes when he states having pain. We continue to use the Vashe currently. 03/06/18 on evaluation today patient's sacral wound appears to be doing better in my opinion. He has been tolerating the dressing changes without complication. With that being said the silver nitrate has helped with the prominent area of hyper granulation at the 6 o'clock location we will likely need to repeat this again today. Nonetheless overall I am pleased with how things have improved over the last week. The erythema surrounding the wound seems to be greatly improved. 03/13/18 on evaluation today patient's wound actually does not appear to be terribly infected although she does continue to have erythema surrounding the wound bed especially on the left border. I'm still somewhat concerned about the fact that the oral antibiotics alone may not be completely treating his infection. I previously discussed may need to go for IV antibiotic therapy I'm concerned that may be the case. We will need to make a referral today for infectious disease. 03/20/18 on evaluation today the patient sacral wound actually appears to be doing fairly well in regard to granulation although he continues unfortunately to have it your theme is surrounding the periwound region. There's also some increased swelling at the 6 o'clock location which also has me somewhat concerned. With that being said he does have some discomfort but PRABHAV, FAULKENBERRY. ([id number]) nothing too significant at this point. He still has not heard from infectious disease his daughter and wife are both present during  the office visit today they're going to check back with this again. We did get the information for them to call them today. 03/27/18 on evaluation today patient is  seen concerning his ongoing sacral ulcer. He has been tolerating the dressing changes without complication. With that being said he does present with evidence of bright green drainage noted on the dressing which again is something that I do often expect to see with a pseudomonas infection. He continues to have your theme is surrounding the wound bed as well and again I'm not 100% convinced this is just pressure related. I did speak with Colletta Maryland who is the nurse practitioner in [place] with infectious disease. I spoke with her actually yesterday concerning this patient. She is not 100% convinced that this is infected. She question whether the wound may just be colonized with Pseudomonas and not actually causing an active infection. I am really not thinking that the edema is associated with pressure alone and again overall I don't feel that your theme and is consistent with a pressure injury either as he's never had any contusions noted like a deep tissue injury on his heel which was new and I did visualize today this was on the left heel. Nonetheless she wanted to give this a little bit more time and thought it would be appropriate to start the Wound VAC at this point. 04/03/18 on evaluation today patient sacral ulcer actually appears to be doing fairly well at this point. He has been tolerating the dressing changes without complication. With that being said I'm very pleased with the progress that has been made in regard to his sacral wound over the past week I do not see as much in the way of erythema which is great news. Nonetheless he does have a small area of hyper granulation unfortunately. This is at roughly the 7 o'clock location and I think does need to be addressed so that this will heal more appropriately. Nonetheless I think  we may be ready to go ahead and initiate therapy with the Wound VAC. 04/10/18 on evaluation today patient appears to be doing excellent in regard to his sacral ulcer. The show signs of great improvement in overall I'm very pleased with how things look. He has been tolerating the dressing changes without complication. Specifically this is the Wound VAC. He also seems to be doing well with the antibiotic there is decreased your theme and redness surrounding the sacral area at this point in the wound has filled in quite significantly. 04/17/18 on evaluation today patient actually appears to be doing excellent in regard to his sacral ulcer. He's been tolerating the dressing changes without complication specifically the Wound VAC. There really are no major concerns from the patient nor family this point he is having no pain. He does have a little bit of Epiboly on the lateral portions of the wound where he does have a little bit more depth that will need to be addressed today. 04/23/18 on evaluation today patient's wound actually appears to be doing excellent at this point. He has been tolerating the Wound VAC and this appears to be doing well other than the fact that it seems to be breaking seal at the 6 o'clock location. I do believe that adding a duodenum dressing at this location try and help maintain the seal would be appropriate and likely very effective. With that being said he overall seems to be showing signs of good improvement at this point. There does not appear to be any evidence of significant infection which is also excellent news. 04/30/18 on evaluation today patient actually appears to be doing well in regard to his sacral ulcer. He's been  tolerating the dressing changes without complication. Fortunately there does not appear to be any evidence of infection. Overall I'm very pleased with the progress that has been made up to this point. He does have some blistering underneath the  draping unfortunately although this is definitely something that has been noted on other patients previously is a fairly common occurrence. Nonetheless the patient seems to be doing fairly well in general in my pinion based on what I see at this time. I do believe these are fairly superficial and minor. 05/07/18 on evaluation today patient's wound actually appears to be doing excellent at this point in time. He has been tolerating the Wound VAC decently well he states that it is somewhat cumbersome to carry around unfortunately. The only other issue he's been having according to family is that they been having a difficult time keeping the Wound VAC in place and doing what is supposed to do without making. Obviously I do think that this is definitely of concern. Nonetheless I do believe she's made good progress up to this point. I'm very happy in that regard. 05/14/18 on evaluation today patient's wound continues to make good progress at this point. He had a minimal amount of slough noted on the surface which was easily wiped away with saline and gauze and in general I feel like that he is continuing to show excellent progress even with the discontinuation of the Wound VAC. Overall I'm pleased in this regard. He was having issues with the Wound VAC in getting it to seal I think that using the Prisma at this time has been equally efficient and getting the wound to diminish in size. 05/29/18- He is here in follow up evaluation for a sacral ulcer. There is improvement, we will continue with prisma and he will follow up in two weeks KEN, BONN ([id number]) 06/11/18 on evaluation today patient had unfortunately bright green drainage on the dressing upon evaluation today. This is something that we have encountered before although we were able to get things under control previously with antibiotics. With that being said currently upon further inspection of the three areas of hyper granulation that were  separate from the actual wound itself which was almost healed it really appears that these all have some depth to them. They are more tunnels that really have not closed or at least have reopened as a result likely of infection in my pinion. This is definitely not what I was expecting or hoping for. 06/26/18 on evaluation today patient continues to experience issues with what appears to be small abscesses in the sacral region unfortunately. With that being said he has been tolerating the dressing changes without complication which is good news. He's not having any significant discomfort which is also good news. With that being said his daughter states that after he left last week that the packing that we have placed fell out quite rapidly and he subsequently healed over very quick to the point they were not able to even repack the regions. Nonetheless there appear to be several fluctuance areas noted at this point there's one central region that does seem to be draining still discharge that is somewhat green in color. I did review the results of the wound culture which did show evidence of infection with both MRSA as well as pseudomonas based on that result. Nonetheless again with his other current medications we are not able to do the Cipro due to issues with potential long QT syndrome. I am going  to give him a prescription for doxycycline in order to help with the potential MRSA infection. 07/09/18 on evaluation today patient actually appears to be doing about the same in regard to his sacral wound. He actually has his MRI scheduled for tomorrow and then subsequently is going to be having his infectious disease appointment for Thursday of this week. Fortunately he's not having any significant discomfort he still has your theme in the sacral region he still has several blister/flux went areas although they technically are not blisters this is more like a underlying abscess. The one area that is open  still does probe down to bone. Again I am concerned about a deeper infection possibly even sacral osteomyelitis. 07/23/18 on evaluation today patient actually appears to be doing very well all things considered in regard to his sacral ulcer. Since I've last seen him he actually did have his MRI performed which showed that he has a complex fluid collection superficial to the distal sacrum measuring 5.9 x 4.4 x 2.8 cm which abuts the posterior aspect of the distal sacrum and the sacrum itself shows cortical destruction consistent with osteomyelitis. She has also been seen by infectious disease and currently orders have been initiated for IV antibiotic therapy for the next eight weeks. He was seen by Janene Madeira NP and placed on Ceftazidime and Daptomycin. Currently he has not really been on this quite long enough to see a sufficient response to the new orders as far as antibiotic therapy is concerned. Nonetheless it does appear that he is likely on the right track at this point which is great news. Nonetheless the question which both Colletta Maryland and myself have discussed both with the patient and between ourselves is whether or not the patient may need to be seen by surgeon for surgical evacuation of the fluid collection/abscess and possible debridement of the sacrum itself. Nonetheless at this time my personal opinion is probably gonna be that we wait and get this at least a couple weeks to see the response that he receives with the IV antibiotic therapy. 08/06/18 on evaluation today patient actually appears to be doing rather well in regard to his sacral ulcer region all things considered. He has been tolerating the dressing changes without complication. With that being said there is not any obvious opening nor any drainage noted at this point in time upon evaluation. The patient has been tolerating the dressing changes though. He's doing well with the IV antibiotic therapy. 08/20/18 on evaluation  today patient appears to be doing wonderful in regard to his sacral region. In fact there appears to be no wound opening or drainage at this time. Overall I'm very pleased with how things have progressed. The patient likewise as well as his wife and daughter are very happy as well. Readmission: 11/20/18 on evaluation today patient presents for reevaluation our clinic concerning issues with his sacral region. Unfortunately the area in question is the same region which we previously treated what we thought would successfully back in November 2019. At that time the patient underwent IV antibiotic therapy which seemed to do the job very well. Subsequently however in the past couple of weeks he has begun to have drainage and bleeding from the sacral region and is having increased pain yet again. Fortunately there is no signs of systemic infection although it does appear that the complex abscess that was previously noted may not have fully cleared there was a question between myself as well is infectious disease previous whether not  he needed to see a surgeon being that things got better the family opted not to see a Psychologist, sport and exercise at that time. Nonetheless I am concerned that he may the need to see a surgeon in order to have this area surgically debrided and possibly a bone culture obtained in order to get a better idea of what we're treating and ensure that this is able to completely and fully heal. 11/27/18 on evaluation today patient appears to be doing about the same in regard to his sacral ulcer. He did see Dr. Viviano Simas, [name] ([id number]) yesterday and the plan is to proceed forward with surgery to clean out the region. This seems to be the most appropriate goal of treatment at this point. Dr. Lysle Pearl just needed to speak with Dr. Ellene Route the patient's neurologist in order to get clearance as the patient was supposed to be scheduled for a carpal tunnel and elbow surgery with Dr. Ellene Route. Nonetheless he  has apparently given clearance to proceed with this surgery for the sacral region which is deemed more important at this point. Unfortunately the patient had a little bit of increased pain although he is having redness at this point there does not appear to be any spreading infection which is good news. No fevers, chills, nausea, or vomiting noted at this time. 12/03/18 on evaluation today patient actually appears to be doing about the same in regard to the sacral ulcer. He still waiting to hear back from Dr. Ines Bloomer office in regard to scheduling his surgery. Fortunately there's no signs of systemic infection at this time which is good news. Unfortunately he really does not seem to making any progress the doxycycline does seem to at least be beneficial in helping to hold off the infection from worsening although unfortunately he still has a lot going on in this regard. Patient's daughter states she's actually gonna contact her office tomorrow to see what exactly is going on. 12/10/18 on evaluation today patient actually appears to be doing about the same in regard to her sacral room. Apparently he still waiting on approval from both cardiology as well as Dr. Ellene Route although apparently he's Artie gotten a formal letter from Dr. Ellene Route stated that he was clear to proceed with the sacral surgery. Di Kindle actually found the clearance letter from cardiology in epic as well today and this subsequently was given to the patient's daughter as that seems to be the only thing that was holding up proceeding with the surgery. Hopefully should be able to take that over to the surgeon's office today in order to go ahead and get this scheduled. 12/24/18 on evaluation today patient actually appears to be doing very well in regard to his sacral ulcer compared to when I last saw him. He has had surgery Dr. Lysle Pearl and it does appear that he was able to clear out this area of abscess very well without complication. This did  extend down to the bone and a portion of the bone was sent for pathology and culture. Although on the pathology report it appears this is more tissue and not bone noted. With that being said the culture from the bone and Henrene Pastor asked him this was obtained revealed Corynebacterium striatum with no anaerobes isolated. Dr. Lysle Pearl did not place the patient on the antibiotics at this point. Again I have been debating on whether or not this is the patient to infectious disease I think that I am going to do that at this point to gain their opinion  on whether or not there's anything we should be treated in this regard and if so will be the best treatment options. 12/31/18 on evaluation today patient actually appears to be doing okay in regard to his sacral wound. Fortunately there does not appear to be any evidence of infection at this time which is good news. He has been tolerating the dressing's without complication. With that being said he did see infectious disease and apparently they realize that the pathology samples sent from the operating room was actually. I'll see him and not bone and the question is whether the culture and sample or just contamination from the skin of not guilty and active infection. The daughter is very concerned about this she states that we may need to get a piece of bone to send for examination to ensure there's nothing more severe going on here. Obviously with this history of healing and then obsesses forming and then having to go for surgery now and reopening everything they have a reason to be concerned I completely agree. 01/07/19 on evaluation today patient actually appears to be doing well in regard to his sacral wound. Again we did get results back of his bone culture as well as the pathology which showed no evidence of osteomyelitis at this point this is excellent news. Overall I think that he likely does not need to go forward with the MRI since everything seems to be  showing negative at this time. Especially in light of the fact that the wound appears to be doing well and that the infection and everything was running the wound bed seems to be dramatically improved. Patient's daughter is in agreement with this plan. 01/14/19 on evaluation today patient appears to be doing rather well at this time as far as the overall appearance of the sacral wound is concerned. The depth is slightly improved he has some granulation tissue noted there still is bone in the very bottom of the wound bed although I'm no longer able to palpate this with my finger I can only tell by probing with the sterile cotton swab. Nonetheless I do believe this is shown signs of good improvement in healing as far as what I'm seeing at this point. 01/22/19 on evaluation today patient's wound in the sacral region appears to be doing rather well. Fortunately there does not seem to be any evidence of active infection at this time which is good news but patient overall has been doing excellent currently. I'm very pleased with how things have progressed over the past week. 01/28/19 on evaluation today patient actually appears to be doing very well in regard to his sacral wound. The area where the bone was exposed is closing in on becoming much more solid which is excellent news there does not appear to be any signs of active infection at this time also good news. Overall very pleased with how everything seems to be progressing. 02/04/19 on evaluation today patient appears to be doing much better in regard to the sacral wound. He has been tolerating the dressing changes without complication and though he is making some progress there's actually some hyper granulation noted at this point. I do think he may benefit from a dressing change we been using the Prisma with the alginate pack behind AL, BRACEWELL. ([id number]) for some time I think we may want to switch to [facility] Dressing to see if this could  be of benefit. 02/11/19 on evaluation today patient appears to be doing well in  regard to his sacral wound. There does not appear to be any signs of active infection which is good news. There is some hyper granular tissue which we are attempting to address little by little with the [facility] Dressing although I do believe that many to try silver nitrate today as well. Patient History Information obtained from Patient. Family History Cancer - Paternal Grandparents, Diabetes - Father, Heart Disease - Mother,Father, Stroke - Father, No family history of Hypertension, Kidney Disease, Lung Disease, Seizures, Thyroid Problems, Tuberculosis. Social History Never smoker, Marital Status - Widowed, Alcohol Use - Never, Drug Use - No History, Caffeine Use - Daily. Medical History Eyes Patient has history of Cataracts - bilateral removal Denies history of Glaucoma, Optic Neuritis Ear/Nose/Mouth/Throat Denies history of Chronic sinus problems/congestion, Middle ear problems Hematologic/Lymphatic Denies history of Anemia, Hemophilia, Human Immunodeficiency Virus, Lymphedema, Sickle Cell Disease Respiratory Patient has history of Asthma Denies history of Aspiration, Chronic Obstructive Pulmonary Disease (COPD), Pneumothorax, Sleep Apnea, Tuberculosis Cardiovascular Patient has history of Angina, Arrhythmia, Coronary Artery Disease, Hypertension, Myocardial Infarction - 2001 Denies history of Congestive Heart Failure, Deep Vein Thrombosis, Hypotension, Peripheral Arterial Disease, Peripheral Venous Disease, Phlebitis, Vasculitis Gastrointestinal Denies history of Cirrhosis , Colitis, Crohn s, Hepatitis A, Hepatitis B, Hepatitis C Endocrine Denies history of Type I Diabetes, Type II Diabetes Genitourinary Denies history of End Stage Renal Disease Immunological Denies history of Lupus Erythematosus, Raynaud s, Scleroderma Integumentary (Skin) Denies history of History of Burn, History of  pressure wounds Musculoskeletal Patient has history of Osteoarthritis Denies history of Gout, Rheumatoid Arthritis, Osteomyelitis Neurologic Patient has history of Dementia, Neuropathy Denies history of Quadriplegia, Paraplegia, Seizure Disorder Oncologic Denies history of Received Chemotherapy, Received Radiation Psychiatric Denies history of Anorexia/bulimia, Confinement Anxiety Hospitalization/Surgery History - Fall. Medical And Surgical History Notes Endocrine Borderline Oncologic Melanoma on back REIS, GOGA ([id number]) Review of Systems (ROS) Constitutional Symptoms ([place]) Denies complaints or symptoms of Fatigue, Fever, Chills, Marked Weight Change. Respiratory Denies complaints or symptoms of Chronic or frequent coughs, Shortness of Breath. Cardiovascular Denies complaints or symptoms of Chest pain, LE edema. Psychiatric Denies complaints or symptoms of Anxiety, Claustrophobia. Objective Constitutional Well-nourished and well-hydrated in no acute distress. Vitals Time Taken: 2:00 PM, Height: 69 in, Weight: 170.6 lbs, BMI: 25.2, Temperature: 98.3 F, Pulse: 59 bpm, Respiratory Rate: 16 breaths/min, Blood Pressure: 152/68 mmHg. Respiratory normal breathing without difficulty. Psychiatric this patient is able to make decisions and demonstrates good insight into disease process. Alert and Oriented x 3. pleasant and cooperative. General Notes: Patient's wound bed did require mechanical debridement with saline and gauze which he tolerated without complication. Subsequently post mechanical debridement I did go ahead and use silver nitrate to chemically cauterize some of the hyper granular region on the proximal portion of the wound. He tolerated this without any pain. Integumentary (Hair, Skin) Wound #2 status is Open. Original cause of wound was Gradually Appeared. The wound is located on the Midline Sacrum. The wound measures 2.3cm length x 0.5cm width x  0.5cm depth; 0.903cm^2 area and 0.452cm^3 volume. There is Fat Layer (Subcutaneous Tissue) Exposed exposed. There is no tunneling or undermining noted. There is a medium amount of purulent drainage noted. The wound margin is indistinct and nonvisible. There is medium (34-66%) pale, hyper - granulation within the wound bed. There is a medium (34-66%) amount of necrotic tissue within the wound bed including Adherent Slough. The periwound skin appearance exhibited: Scarring, Erythema. The periwound skin appearance did not exhibit: Callus, Crepitus, Excoriation, Induration,  Rash, Dry/Scaly, Maceration, Atrophie Blanche, Cyanosis, Ecchymosis, Hemosiderin Staining, Mottled, Pallor, Rubor. The surrounding wound skin color is noted with erythema which is circumferential. Periwound temperature was noted as No Abnormality. The periwound has tenderness on palpation. Assessment Active Problems LEEVON, UPPERMAN ([id number]) ICD-10 Pressure ulcer of sacral region, stage 4 Cutaneous abscess of buttock Procedures Wound #2 Pre-procedure diagnosis of Wound #2 is a Pressure Ulcer located on the Midline Sacrum . An CHEM CAUT GRANULATION TISS procedure was performed by STONE III, HOYT E., PA-C. Post procedure Diagnosis Wound #2: Same as Pre-Procedure Plan Wound Cleansing: Wound #2 Midline Sacrum: Clean wound with Normal Saline. May Shower, gently pat wound dry prior to applying new dressing. Anesthetic (add to Medication List): Wound #2 Midline Sacrum: Topical Lidocaine 4% cream applied to wound bed prior to debridement (In Clinic Only). Primary Wound Dressing: Wound #2 Midline Sacrum: Hydrafera Blue Ready Transfer Secondary Dressing: Wound #2 Midline Sacrum: Boardered Foam Dressing Dressing Change Frequency: Wound #2 Midline Sacrum: Change dressing every other day. Follow-up Appointments: Wound #2 Midline Sacrum: Return Appointment in 1 week. Off-Loading: Wound #2 Midline Sacrum: Turn and  reposition every 2 hours My suggestion at this point is gonna be that we go ahead and continue with the [facility] Dressing as this is beneficial for him up to this point and does seem to be the appropriate thing going forward in my pinion. We will subsequently see were things stand at follow-up. If anything changes or worsens in the meantime he will contact the office and let me know. Please see above for specific wound care orders. We will see patient for re-evaluation in 1 week(s) here in the clinic. If anything worsens or changes patient will contact our office for additional recommendations. Electronic Signature(s) JOSHIAH, TRAYNHAM ([id number]) Signed: 02/11/2019 5:07:11 PM By: Worthy Keeler PA-C Entered By: Worthy Keeler on 02/11/2019 15:35:45 Linus, Weckerly [name] ([id number]) -------------------------------------------------------------------------------- ROS/PFSH Details Patient Name: [name] Date of Service: 02/11/2019 1:45 PM Medical Record Number: [id number] Patient Account Number: [id number] Date of Birth/Sex: 09/18/1931 (85 y.o. M) Treating RN: Harold Barban Primary Care Provider: Burman Freestone Other Clinician: Referring Provider: Burman Freestone Treating Provider/Extender: [name], HOYT Weeks in Treatment: 11 Information Obtained From Patient Constitutional Symptoms (General Health) Complaints and Symptoms: Negative for: Fatigue; Fever; Chills; Marked Weight Change Respiratory Complaints and Symptoms: Negative for: Chronic or frequent coughs; Shortness of Breath Medical History: Positive for: Asthma Negative for: Aspiration; Chronic Obstructive Pulmonary Disease (COPD); Pneumothorax; Sleep Apnea; Tuberculosis Cardiovascular Complaints and Symptoms: Negative for: Chest pain; LE edema Medical History: Positive for: Angina; Arrhythmia; Coronary Artery Disease; Hypertension; Myocardial Infarction - 2001 Negative for: Congestive Heart Failure; Deep  Vein Thrombosis; Hypotension; Peripheral Arterial Disease; Peripheral Venous Disease; Phlebitis; Vasculitis Psychiatric Complaints and Symptoms: Negative for: Anxiety; Claustrophobia Medical History: Negative for: Anorexia/bulimia; Confinement Anxiety Eyes Medical History: Positive for: Cataracts - bilateral removal Negative for: Glaucoma; Optic Neuritis Ear/Nose/Mouth/Throat Medical History: Negative for: Chronic sinus problems/congestion; Middle ear problems Hematologic/Lymphatic Medical History: Negative for: Anemia; Hemophilia; Human Immunodeficiency Virus; Lymphedema; Sickle Cell Disease ARLES, RUMBOLD ([id number]) Gastrointestinal Medical History: Negative for: Cirrhosis ; Colitis; Crohnos; Hepatitis A; Hepatitis B; Hepatitis C Endocrine Medical History: Negative for: Type I Diabetes; Type II Diabetes Past Medical History Notes: Borderline Genitourinary Medical History: Negative for: End Stage Renal Disease Immunological Medical History: Negative for: Lupus Erythematosus; Raynaudos; Scleroderma Integumentary (Skin) Medical History: Negative for: History of Burn; History of pressure wounds Musculoskeletal Medical History: Positive for: Osteoarthritis Negative for: Gout; Rheumatoid  Arthritis; Osteomyelitis Neurologic Medical History: Positive for: Dementia; Neuropathy Negative for: Quadriplegia; Paraplegia; Seizure Disorder Oncologic Medical History: Negative for: Received Chemotherapy; Received Radiation Past Medical History Notes: Melanoma on back HBO Extended History Items Eyes: Cataracts Immunizations Pneumococcal Vaccine: Received Pneumococcal Vaccination: Yes Implantable Devices No devices added Hospitalization / Surgery History Type of Hospitalization/Surgery TAYGEN, ACKLIN ([id number]) Fall Family and Social History Cancer: Yes - Paternal Grandparents; Diabetes: Yes - Father; Heart Disease: Yes - Mother,Father; Hypertension: No;  Kidney Disease: No; Lung Disease: No; Seizures: No; Stroke: Yes - Father; Thyroid Problems: No; Tuberculosis: No; Never smoker; Marital Status - Widowed; Alcohol Use: Never; Drug Use: No History; Caffeine Use: Daily; Financial Concerns: No; Food, Clothing or Shelter Needs: No; Support System Lacking: No; Transportation Concerns: No Physician Affirmation I have reviewed and agree with the above information. Electronic Signature(s) Signed: 02/11/2019 4:10:42 PM By: Harold Barban Signed: 02/11/2019 5:07:11 PM By: Worthy Keeler PA-C Entered By: Worthy Keeler on 02/11/2019 15:35:10 [name] ([id number]) -------------------------------------------------------------------------------- SuperBill Details Patient Name: [name] Date of Service: 02/11/2019 Medical Record Number: [id number] Patient Account Number: [id number] Date of Birth/Sex: Aug 30, 1931 (85 y.o. M) Treating RN: Harold Barban Primary Care Provider: Burman Freestone Other Clinician: Referring Provider: Burman Freestone Treating Provider/Extender: [name], HOYT Weeks in Treatment: 11 Diagnosis Coding ICD-10 Codes Code Description L89.154 Pressure ulcer of sacral region, stage 4 L02.31 Cutaneous abscess of buttock Facility Procedures CPT4 Code: 84696295 Description: 28413 - CHEM CAUT GRANULATION TISS ICD-10 Diagnosis Description L89.154 Pressure ulcer of sacral region, stage 4 Modifier: Quantity: 1 Physician Procedures CPT4 Code: 2440102 Description: 72536 - WC PHYS CHEM CAUT GRAN TISSUE ICD-10 Diagnosis Description L89.154 Pressure ulcer of sacral region, stage 4 Modifier: Quantity: 1 Electronic Signature(s) Signed: 02/11/2019 5:07:11 PM By: Worthy Keeler PA-C Entered By: Worthy Keeler on 02/11/2019 15:35:52

## 2019-02-18 ENCOUNTER — Encounter: Payer: Medicare Other | Attending: Physician Assistant | Admitting: Physician Assistant

## 2019-02-18 ENCOUNTER — Other Ambulatory Visit: Payer: Self-pay

## 2019-02-18 DIAGNOSIS — I2510 Atherosclerotic heart disease of native coronary artery without angina pectoris: Secondary | ICD-10-CM | POA: Insufficient documentation

## 2019-02-18 DIAGNOSIS — F0390 Unspecified dementia without behavioral disturbance: Secondary | ICD-10-CM | POA: Insufficient documentation

## 2019-02-18 DIAGNOSIS — I252 Old myocardial infarction: Secondary | ICD-10-CM | POA: Insufficient documentation

## 2019-02-18 DIAGNOSIS — I10 Essential (primary) hypertension: Secondary | ICD-10-CM | POA: Insufficient documentation

## 2019-02-18 DIAGNOSIS — G629 Polyneuropathy, unspecified: Secondary | ICD-10-CM | POA: Diagnosis not present

## 2019-02-18 DIAGNOSIS — Z85820 Personal history of malignant melanoma of skin: Secondary | ICD-10-CM | POA: Diagnosis not present

## 2019-02-18 DIAGNOSIS — L89154 Pressure ulcer of sacral region, stage 4: Secondary | ICD-10-CM | POA: Insufficient documentation

## 2019-02-21 NOTE — Progress Notes (Signed)
[name], [name] ([id number]) Visit Report for 02/18/2019 Arrival Information Details Patient Name: [name], [name]. Date of Service: 02/18/2019 3:30 PM Medical Record Number: [id number] Patient Account Number: [id number] Date of Birth/Sex: 1931/05/31 (85 y.o. M) Treating RN: Harold Barban Primary Care Yaeli Hartung: Burman Freestone Other Clinician: Referring Anjelo Pullman: Burman Freestone Treating Zenobia Kuennen/Extender: [name], HOYT Weeks in Treatment: 12 Visit Information History Since Last Visit Added or deleted any medications: No Patient Arrived: Cane Any new allergies or adverse reactions: No Arrival Time: 15:58 Had a fall or experienced change in No Accompanied By: caregiver activities of daily living that may affect Transfer Assistance: None risk of falls: Patient Identification Verified: Yes Signs or symptoms of abuse/neglect since last visito No Secondary Verification Process Yes Hospitalized since last visit: No Completed: Has Dressing in Place as Prescribed: Yes Patient Has Alerts: Yes Pain Present Now: No Patient Alerts: Patient on Blood Thinner Electronic Signature(s) Signed: 02/21/2019 8:05:03 AM By: Harold Barban Entered By: Harold Barban on 02/18/2019 15:58:35 [name] ([id number]) -------------------------------------------------------------------------------- Encounter Discharge Information Details Patient Name: [name] Date of Service: 02/18/2019 3:30 PM Medical Record Number: [id number] Patient Account Number: [id number] Date of Birth/Sex: Feb 15, 1931 (85 y.o. M) Treating RN: Harold Barban Primary Care [place]: Burman Freestone Other Clinician: Referring Charmelle Soh: Burman Freestone Treating Markesha Hannig/Extender: [name], HOYT Weeks in Treatment: 12 Encounter Discharge Information Items Post Procedure Vitals Discharge Condition: Stable Temperature (F): 98.1 Ambulatory Status: Cane Pulse (bpm): 55 Discharge Destination: Home Respiratory  Rate (breaths/min): 16 Transportation: Private Auto Blood Pressure (mmHg): 173/86 Accompanied By: daughter Schedule Follow-up Appointment: No Clinical Summary of Care: Electronic Signature(s) Signed: 02/21/2019 8:05:03 AM By: Harold Barban Entered By: Harold Barban on 02/18/2019 16:12:50 [name] ([id number]) -------------------------------------------------------------------------------- Lower Extremity Assessment Details Patient Name: [name] Date of Service: 02/18/2019 3:30 PM Medical Record Number: [id number] Patient Account Number: [id number] Date of Birth/Sex: [birth year] (85 y.o. M) Treating RN: Harold Barban Primary Care Jaylenn Altier: Burman Freestone Other Clinician: Referring Anai Lipson: Burman Freestone Treating Tionna Gigante/Extender: [name], HOYT Weeks in Treatment: 12 Electronic Signature(s) Signed: 02/21/2019 8:05:03 AM By: Harold Barban Entered By: Harold Barban on 02/18/2019 15:57:29 [name] ([id number]) -------------------------------------------------------------------------------- Multi Wound Chart Details Patient Name: [name] Date of Service: 02/18/2019 3:30 PM Medical Record Number: [id number] Patient Account Number: [id number] Date of Birth/Sex: 1931/04/01 (85 y.o. M) Treating RN: Harold Barban Primary Care Daxtin Leiker: Burman Freestone Other Clinician: Referring Taliah Porche: Burman Freestone Treating Kerrianne Jeng/Extender: [name], HOYT Weeks in Treatment: 12 Vital Signs Height(in): 69 Pulse(bpm): 55 Weight(lbs): 170.6 Blood Pressure(mmHg): 173/62 Body Mass Index(BMI): 25 Temperature(F): 98.1 Respiratory Rate 16 (breaths/min): Photos: [N/A:N/A] Wound Location: Sacrum - Midline N/A N/A Wounding Event: Gradually Appeared N/A N/A Primary Etiology: Pressure Ulcer N/A N/A Secondary Etiology: Abscess N/A N/A Comorbid History: Cataracts, Asthma, Angina, N/A N/A Arrhythmia, Coronary Artery Disease, Hypertension, Myocardial  Infarction, Osteoarthritis, Dementia, Neuropathy Date Acquired: 10/30/2018 N/A N/A Weeks of Treatment: 12 N/A N/A Wound Status: Open N/A N/A Measurements L x W x D 1.8x0.4x0.8 N/A N/A (cm) Area (cm) : 0.565 N/A N/A Volume (cm) : 0.452 N/A N/A % Reduction in Area: -6962.50% N/A N/A % Reduction in Volume: -2160.00% N/A N/A Classification: Category/Stage IV N/A N/A Exudate Amount: Medium N/A N/A Exudate Type: Purulent N/A N/A Exudate Color: yellow, brown, green N/A N/A Wound Margin: Indistinct, nonvisible N/A N/A Granulation Amount: Medium (34-66%) N/A N/A Granulation Quality: Pale, Hyper-granulation N/A N/A Necrotic Amount: Medium (34-66%) N/A N/A Exposed Structures: Fat Layer (Subcutaneous N/A N/A Tissue) Exposed: Yes Dearden, Jayden J. (  [id number]) Fascia: No Tendon: No Muscle: No Joint: No Bone: No Epithelialization: Medium (34-66%) N/A N/A Periwound Skin Texture: Scarring: Yes N/A N/A Excoriation: No Induration: No Callus: No Crepitus: No Rash: No Periwound Skin Moisture: Maceration: No N/A N/A Dry/Scaly: No Periwound Skin Color: Erythema: Yes N/A N/A Atrophie Blanche: No Cyanosis: No Ecchymosis: No Hemosiderin Staining: No Mottled: No Pallor: No Rubor: No Erythema Location: Circumferential N/A N/A Temperature: No Abnormality N/A N/A Tenderness on Palpation: Yes N/A N/A Treatment Notes Electronic Signature(s) Signed: 02/21/2019 8:05:03 AM By: Harold Barban Entered By: Harold Barban on 02/18/2019 16:01:30 [name] ([id number]) -------------------------------------------------------------------------------- Multi-Disciplinary Care Plan Details Patient Name: [name] Date of Service: 02/18/2019 3:30 PM Medical Record Number: [id number] Patient Account Number: [id number] Date of Birth/Sex: 05-Dec-1930 (85 y.o. M) Treating RN: Harold Barban Primary Care Shailene Demonbreun: Burman Freestone Other Clinician: Referring Forever Arechiga: Burman Freestone Treating Shakeyla Giebler/Extender: [name], HOYT Weeks in Treatment: 12 Active Inactive Abuse / Safety / Falls / Self Care Management Nursing Diagnoses: Potential for falls Goals: Patient will not experience any injury related to falls Date Initiated: 11/20/2018 Target Resolution Date: 02/15/2019 Goal Status: Active Interventions: Assess fall risk on admission and as needed Notes: Orientation to the Wound Care Program Nursing Diagnoses: Knowledge deficit related to the wound healing center program Goals: Patient/caregiver will verbalize understanding of the [facility] Date Initiated: 11/20/2018 Target Resolution Date: 02/15/2019 Goal Status: Active Interventions: Provide education on orientation to the wound center Notes: Wound/Skin Impairment Nursing Diagnoses: Impaired tissue integrity Goals: Ulcer/skin breakdown will heal within 14 weeks Date Initiated: 11/20/2018 Target Resolution Date: 02/15/2019 Goal Status: Active Interventions: Assess patient/caregiver ability to obtain necessary supplies RHYTHM, WIGFALL ([id number]) Assess patient/caregiver ability to perform ulcer/skin care regimen upon admission and as needed Assess ulceration(s) every visit Notes: Electronic Signature(s) Signed: 02/21/2019 8:05:03 AM By: Harold Barban Entered By: Harold Barban on 02/18/2019 16:01:18 [name] ([id number]) -------------------------------------------------------------------------------- Pain Assessment Details Patient Name: [name] Date of Service: 02/18/2019 3:30 PM Medical Record Number: [id number] Patient Account Number: [id number] Date of Birth/Sex: 1931-03-20 (85 y.o. M) Treating RN: Harold Barban Primary Care Kimarie Coor: Burman Freestone Other Clinician: Referring Arelie Kuzel: Burman Freestone Treating Yaira Bernardi/Extender: [name], HOYT Weeks in Treatment: 12 Active Problems Location of Pain Severity and Description of Pain Patient Has  Paino No Site Locations Pain Management and Medication Current Pain Management: Electronic Signature(s) Signed: 02/21/2019 8:05:03 AM By: Harold Barban Entered By: Harold Barban on 02/18/2019 15:58:13 [name] ([id number]) -------------------------------------------------------------------------------- Patient/Caregiver Education Details Patient Name: [name] Date of Service: 02/18/2019 3:30 PM Medical Record Number: [id number] Patient Account Number: [id number] Date of Birth/Gender: 1931/08/02 (85 y.o. M) Treating RN: Harold Barban Primary Care Physician: Burman Freestone Other Clinician: Referring Physician: Burman Freestone Treating Physician/Extender: Sharalyn Ink in Treatment: 12 Education Assessment Education Provided To: Patient Education Topics Provided Wound/Skin Impairment: Handouts: Caring for Your Ulcer Methods: Demonstration, Explain/Verbal Responses: State content correctly Electronic Signature(s) Signed: 02/21/2019 8:05:03 AM By: Harold Barban Entered By: Harold Barban on 02/18/2019 16:01:55 [name] ([id number]) -------------------------------------------------------------------------------- Wound Assessment Details Patient Name: [name] Date of Service: 02/18/2019 3:30 PM Medical Record Number: [id number] Patient Account Number: [id number] Date of Birth/Sex: 05-21-1931 (85 y.o. M) Treating RN: Harold Barban Primary Care Labrina Lines: Burman Freestone Other Clinician: Referring Jamice Carreno: Burman Freestone Treating Antuane Eastridge/Extender: STONE III, HOYT Weeks in Treatment: 12 Wound Status Wound Number: 2 Primary Pressure Ulcer Etiology: Wound Location: Sacrum - Midline Secondary Abscess Wounding Event: Gradually Appeared Etiology: Date Acquired:  10/30/2018 Wound Open Weeks Of Treatment: 12 Status: Clustered Wound: No Comorbid Cataracts, Asthma, Angina, Arrhythmia, History: Coronary Artery Disease,  Hypertension, Myocardial Infarction, Osteoarthritis, Dementia, Neuropathy Photos Wound Measurements Length: (cm) 1.8 Width: (cm) 0.4 Depth: (cm) 0.8 Area: (cm) 0.565 Volume: (cm) 0.452 % Reduction in Area: -6962.5% % Reduction in Volume: -2160% Epithelialization: Medium (34-66%) Tunneling: No Undermining: No Wound Description Classification: Category/Stage IV Foul Odor Af Wound Margin: Indistinct, nonvisible Slough/Fibri Exudate Amount: Medium Exudate Type: Purulent Exudate Color: yellow, brown, green ter Cleansing: No no No Wound Bed Granulation Amount: Medium (34-66%) Exposed Structure Granulation Quality: Pale, Hyper-granulation Fascia Exposed: No Necrotic Amount: Medium (34-66%) Fat Layer (Subcutaneous Tissue) Exposed: Yes Necrotic Quality: Adherent Slough Tendon Exposed: No Muscle Exposed: No Joint Exposed: No Bone Exposed: No ZAYDAN, PAPESH ([id number]) Periwound Skin Texture Texture Color No Abnormalities Noted: No No Abnormalities Noted: No Callus: No Atrophie Blanche: No Crepitus: No Cyanosis: No Excoriation: No Ecchymosis: No Induration: No Erythema: Yes Rash: No Erythema Location: Circumferential Scarring: Yes Hemosiderin Staining: No Mottled: No Moisture Pallor: No No Abnormalities Noted: No Rubor: No Dry / Scaly: No Maceration: No Temperature / Pain Temperature: No Abnormality Tenderness on Palpation: Yes Treatment Notes Wound #2 (Midline Sacrum) Notes plain collagen, silver cell, BFD Electronic Signature(s) Signed: 02/21/2019 8:05:03 AM By: Harold Barban Entered By: Harold Barban on 02/18/2019 15:56:53 [name] ([id number]) -------------------------------------------------------------------------------- Vitals Details Patient Name: [name] Date of Service: 02/18/2019 3:30 PM Medical Record Number: [id number] Patient Account Number: [id number] Date of Birth/Sex: 12/26/1930 (85 y.o. M) Treating RN: Harold Barban Primary Care Taytum Scheck: Burman Freestone Other Clinician: Referring Kentaro Alewine: Burman Freestone Treating Cayleigh Paull/Extender: [name], HOYT Weeks in Treatment: 12 Vital Signs Time Taken: 15:55 Temperature (F): 98.1 Height (in): 69 Pulse (bpm): 55 Weight (lbs): 170.6 Respiratory Rate (breaths/min): 16 Body Mass Index (BMI): 25.2 Blood Pressure (mmHg): 173/62 Reference Range: 80 - 120 mg / dl Electronic Signature(s) Signed: 02/21/2019 8:05:03 AM By: Harold Barban Entered By: Harold Barban on 02/18/2019 15:58:00

## 2019-02-21 NOTE — Progress Notes (Signed)
[name] ([id number]) Visit Report for 02/18/2019 Chief Complaint Document Details Patient Name: [name], [name]. Date of Service: 02/18/2019 3:30 PM Medical Record Number: [id number] Patient Account Number: [id number] Date of Birth/Sex: [birth year] (85 y.o. M) Treating RN: Harold Barban Primary Care Provider: Burman Freestone Other Clinician: Referring Provider: Burman Freestone Treating Provider/Extender: [name], HOYT Weeks in Treatment: 12 Information Obtained from: Patient Chief Complaint Sacral pressure ulcer Electronic Signature(s) Signed: 02/18/2019 4:49:37 PM By: Worthy Keeler PA-C Entered By: Worthy Keeler on 02/18/2019 16:00:02 [name] ([id number]) -------------------------------------------------------------------------------- Debridement Details Patient Name: [name] Date of Service: 02/18/2019 3:30 PM Medical Record Number: [id number] Patient Account Number: [id number] Date of Birth/Sex: 04/28/1931 (85 y.o. M) Treating RN: Harold Barban Primary Care Provider: Burman Freestone Other Clinician: Referring Provider: Burman Freestone Treating Provider/Extender: [name], HOYT Weeks in Treatment: 12 Debridement Performed for Wound #2 Midline Sacrum Assessment: Performed By: Physician STONE III, HOYT E., PA-C Debridement Type: Debridement Level of Consciousness (Pre- Awake and Alert procedure): Pre-procedure Verification/Time Yes - 16:03 Out Taken: Start Time: 16:03 Pain Control: Lidocaine Total Area Debrided (L x W): 1.8 (cm) x 0.4 (cm) = 0.72 (cm) Tissue and other material Viable, Non-Viable, Slough, Subcutaneous, Skin: Epidermis, Fibrin/Exudate, Slough debrided: Level: Skin/Subcutaneous Tissue Debridement Description: Excisional Instrument: Curette Bleeding: Minimum End Time: 16:07 Procedural Pain: 0 Post Procedural Pain: 0 Response to Treatment: Procedure was tolerated well Level of Consciousness Awake and  Alert (Post-procedure): Post Debridement Measurements of Total Wound Length: (cm) 1.8 Stage: Category/Stage IV Width: (cm) 0.4 Depth: (cm) 0.8 Volume: (cm) 0.452 Character of Wound/Ulcer Post Improved Debridement: Post Procedure Diagnosis Same as Pre-procedure Electronic Signature(s) Signed: 02/18/2019 4:49:37 PM By: Worthy Keeler PA-C Signed: 02/21/2019 8:05:03 AM By: Harold Barban Entered By: Harold Barban on 02/18/2019 16:06:50 [name] ([id number]) -------------------------------------------------------------------------------- HPI Details Patient Name: [name] Date of Service: 02/18/2019 3:30 PM Medical Record Number: [id number] Patient Account Number: [id number] Date of Birth/Sex: [birth year] (85 y.o. M) Treating RN: Harold Barban Primary Care Provider: Burman Freestone Other Clinician: Referring Provider: Burman Freestone Treating Provider/Extender: [name], HOYT Weeks in Treatment: 12 History of Present Illness HPI Description: 02/06/18 on evaluation today patient presents for initial evaluation and our clinic concerning an issue which began roughly 3 weeks ago when the patient fell in his home on the floor in his kitchen and laid him down this detergent for roughly 3 days. He had a pressure injury to the left shoulder. This unfortunately has caused him a lot of discomfort although it finally seems to be doing better if anything is really having a lot of itching right now. This appears potentially be a contact dermatitis issue. He also has a significant pressure injury to the sacrum at this time as well which is also showing fascia exposure right over the bone but no evidence of bone exposure at this point which is good news. They have been using Santyl as well as Saline soaked gauze at this point in time. There does appear to be a lot of necrotic slough in the base of the wound. He does have a history of incontinence, myocardial infarction, and  hypertension. He also is "borderline diabetic" hemoglobin A1c of 6.0. Currently he has some discomfort in the pressure site at the sacrum but fortunately nothing too significant this did require sharp debridement today. 02/13/18 on evaluation today patient appears to be doing much better in regard to his sacral wound. He has been tolerating the dressing changes without complication with  the Vashe. Fortunately there is no evidence of infection and though there is some Slough on the surface of the wound bed he has excellent granulation noted. Overall I'm pleased with how things have progressed in that regard. A glance at his shoulder as well and the rash seems to be someone improving in my pinion at this site as well. Overall I am pleased with what we're seeing and so is the family. 02/20/18 on evaluation today patient appears to be doing a little worse in regard to the sacral wound only in the fact that there is redness surrounding it has me somewhat concerned for infection. The drainage has also apparently been a little bit off color compared to normal according to family they did keep the dressing today that was removed to show me and I agree this seems to be a little bit different compared to what we have been seeing. Coupled with the redness I'm concerned he may be developing some cellulitis surrounding the wound bed. 02/27/18 on evaluation today patient presents for follow-up concerning his sacral ulcer. We have received the results back from his wound culture which shows unfortunately that the doxycycline will not be of benefit for him I am going to need to initiate treatment with something else in order to treat the pseudomonas. Otherwise he does not seem to be having any significant pain although his daughter states there are sometimes when he states having pain. We continue to use the Vashe currently. 03/06/18 on evaluation today patient's sacral wound appears to be doing better in my opinion. He  has been tolerating the dressing changes without complication. With that being said the silver nitrate has helped with the prominent area of hyper granulation at the 6 o'clock location we will likely need to repeat this again today. Nonetheless overall I am pleased with how things have improved over the last week. The erythema surrounding the wound seems to be greatly improved. 03/13/18 on evaluation today patient's wound actually does not appear to be terribly infected although she does continue to have erythema surrounding the wound bed especially on the left border. I'm still somewhat concerned about the fact that the oral antibiotics alone may not be completely treating his infection. I previously discussed may need to go for IV antibiotic therapy I'm concerned that may be the case. We will need to make a referral today for infectious disease. 03/20/18 on evaluation today the patient sacral wound actually appears to be doing fairly well in regard to granulation although he continues unfortunately to have it your theme is surrounding the periwound region. There's also some increased swelling at the 6 o'clock location which also has me somewhat concerned. With that being said he does have some discomfort but nothing too significant at this point. He still has not heard from infectious disease his daughter and wife are both present during the office visit today they're going to check back with this again. We did get the information for them to call them today. 03/27/18 on evaluation today patient is seen concerning his ongoing sacral ulcer. He has been tolerating the dressing changes without complication. With that being said he does present with evidence of bright green drainage noted on the dressing which again is something that I do often expect to see with a pseudomonas infection. He continues to have your theme is GIORDAN, FORDHAM ([id number]) surrounding the wound bed as well and again I'm not  100% convinced this is just pressure related. I did speak with  Colletta Maryland who is the nurse practitioner in [place] with infectious disease. I spoke with her actually yesterday concerning this patient. She is not 100% convinced that this is infected. She question whether the wound may just be colonized with Pseudomonas and not actually causing an active infection. I am really not thinking that the edema is associated with pressure alone and again overall I don't feel that your theme and is consistent with a pressure injury either as he's never had any contusions noted like a deep tissue injury on his heel which was new and I did visualize today this was on the left heel. Nonetheless she wanted to give this a little bit more time and thought it would be appropriate to start the Wound VAC at this point. 04/03/18 on evaluation today patient sacral ulcer actually appears to be doing fairly well at this point. He has been tolerating the dressing changes without complication. With that being said I'm very pleased with the progress that has been made in regard to his sacral wound over the past week I do not see as much in the way of erythema which is great news. Nonetheless he does have a small area of hyper granulation unfortunately. This is at roughly the 7 o'clock location and I think does need to be addressed so that this will heal more appropriately. Nonetheless I think we may be ready to go ahead and initiate therapy with the Wound VAC. 04/10/18 on evaluation today patient appears to be doing excellent in regard to his sacral ulcer. The show signs of great improvement in overall I'm very pleased with how things look. He has been tolerating the dressing changes without complication. Specifically this is the Wound VAC. He also seems to be doing well with the antibiotic there is decreased your theme and redness surrounding the sacral area at this point in the wound has filled in quite  significantly. 04/17/18 on evaluation today patient actually appears to be doing excellent in regard to his sacral ulcer. He's been tolerating the dressing changes without complication specifically the Wound VAC. There really are no major concerns from the patient nor family this point he is having no pain. He does have a little bit of Epiboly on the lateral portions of the wound where he does have a little bit more depth that will need to be addressed today. 04/23/18 on evaluation today patient's wound actually appears to be doing excellent at this point. He has been tolerating the Wound VAC and this appears to be doing well other than the fact that it seems to be breaking seal at the 6 o'clock location. I do believe that adding a duodenum dressing at this location try and help maintain the seal would be appropriate and likely very effective. With that being said he overall seems to be showing signs of good improvement at this point. There does not appear to be any evidence of significant infection which is also excellent news. 04/30/18 on evaluation today patient actually appears to be doing well in regard to his sacral ulcer. He's been tolerating the dressing changes without complication. Fortunately there does not appear to be any evidence of infection. Overall I'm very pleased with the progress that has been made up to this point. He does have some blistering underneath the draping unfortunately although this is definitely something that has been noted on other patients previously is a fairly common occurrence. Nonetheless the patient seems to be doing fairly well in general in my pinion based on  what I see at this time. I do believe these are fairly superficial and minor. 05/07/18 on evaluation today patient's wound actually appears to be doing excellent at this point in time. He has been tolerating the Wound VAC decently well he states that it is somewhat cumbersome to carry around unfortunately.  The only other issue he's been having according to family is that they been having a difficult time keeping the Wound VAC in place and doing what is supposed to do without making. Obviously I do think that this is definitely of concern. Nonetheless I do believe she's made good progress up to this point. I'm very happy in that regard. 05/14/18 on evaluation today patient's wound continues to make good progress at this point. He had a minimal amount of slough noted on the surface which was easily wiped away with saline and gauze and in general I feel like that he is continuing to show excellent progress even with the discontinuation of the Wound VAC. Overall I'm pleased in this regard. He was having issues with the Wound VAC in getting it to seal I think that using the Prisma at this time has been equally efficient and getting the wound to diminish in size. 05/29/18- He is here in follow up evaluation for a sacral ulcer. There is improvement, we will continue with prisma and he will follow up in two weeks 06/11/18 on evaluation today patient had unfortunately bright green drainage on the dressing upon evaluation today. This is something that we have encountered before although we were able to get things under control previously with antibiotics. With that being said currently upon further inspection of the three areas of hyper granulation that were separate from the actual wound itself which was almost healed it really appears that these all have some depth to them. They are more tunnels that really have not closed or at least have reopened as a result likely of infection in my pinion. This is definitely not what I was expecting or hoping for. [name], [name] ([id number]) 06/26/18 on evaluation today patient continues to experience issues with what appears to be small abscesses in the sacral region unfortunately. With that being said he has been tolerating the dressing changes without complication  which is good news. He's not having any significant discomfort which is also good news. With that being said his daughter states that after he left last week that the packing that we have placed fell out quite rapidly and he subsequently healed over very quick to the point they were not able to even repack the regions. Nonetheless there appear to be several fluctuance areas noted at this point there's one central region that does seem to be draining still discharge that is somewhat green in color. I did review the results of the wound culture which did show evidence of infection with both MRSA as well as pseudomonas based on that result. Nonetheless again with his other current medications we are not able to do the Cipro due to issues with potential long QT syndrome. I am going to give him a prescription for doxycycline in order to help with the potential MRSA infection. 07/09/18 on evaluation today patient actually appears to be doing about the same in regard to his sacral wound. He actually has his MRI scheduled for tomorrow and then subsequently is going to be having his infectious disease appointment for Thursday of this week. Fortunately he's not having any significant discomfort he still has your theme in the sacral  region he still has several blister/flux went areas although they technically are not blisters this is more like a underlying abscess. The one area that is open still does probe down to bone. Again I am concerned about a deeper infection possibly even sacral osteomyelitis. 07/23/18 on evaluation today patient actually appears to be doing very well all things considered in regard to his sacral ulcer. Since I've last seen him he actually did have his MRI performed which showed that he has a complex fluid collection superficial to the distal sacrum measuring 5.9 x 4.4 x 2.8 cm which abuts the posterior aspect of the distal sacrum and the sacrum itself shows cortical destruction  consistent with osteomyelitis. She has also been seen by infectious disease and currently orders have been initiated for IV antibiotic therapy for the next eight weeks. He was seen by Janene Madeira NP and placed on Ceftazidime and Daptomycin. Currently he has not really been on this quite long enough to see a sufficient response to the new orders as far as antibiotic therapy is concerned. Nonetheless it does appear that he is likely on the right track at this point which is great news. Nonetheless the question which both Colletta Maryland and myself have discussed both with the patient and between ourselves is whether or not the patient may need to be seen by surgeon for surgical evacuation of the fluid collection/abscess and possible debridement of the sacrum itself. Nonetheless at this time my personal opinion is probably gonna be that we wait and get this at least a couple weeks to see the response that he receives with the IV antibiotic therapy. 08/06/18 on evaluation today patient actually appears to be doing rather well in regard to his sacral ulcer region all things considered. He has been tolerating the dressing changes without complication. With that being said there is not any obvious opening nor any drainage noted at this point in time upon evaluation. The patient has been tolerating the dressing changes though. He's doing well with the IV antibiotic therapy. 08/20/18 on evaluation today patient appears to be doing wonderful in regard to his sacral region. In fact there appears to be no wound opening or drainage at this time. Overall I'm very pleased with how things have progressed. The patient likewise as well as his wife and daughter are very happy as well. Readmission: 11/20/18 on evaluation today patient presents for reevaluation our clinic concerning issues with his sacral region. Unfortunately the area in question is the same region which we previously treated what we thought would  successfully back in November 2019. At that time the patient underwent IV antibiotic therapy which seemed to do the job very well. Subsequently however in the past couple of weeks he has begun to have drainage and bleeding from the sacral region and is having increased pain yet again. Fortunately there is no signs of systemic infection although it does appear that the complex abscess that was previously noted may not have fully cleared there was a question between myself as well is infectious disease previous whether not he needed to see a surgeon being that things got better the family opted not to see a surgeon at that time. Nonetheless I am concerned that he may the need to see a surgeon in order to have this area surgically debrided and possibly a bone culture obtained in order to get a better idea of what we're treating and ensure that this is able to completely and fully heal. 11/27/18 on evaluation today  patient appears to be doing about the same in regard to his sacral ulcer. He did see Dr. Lysle Pearl yesterday and the plan is to proceed forward with surgery to clean out the region. This seems to be the most appropriate goal of treatment at this point. Dr. Lysle Pearl just needed to speak with Dr. Ellene Route the patient's neurologist in order to get clearance as the patient was supposed to be scheduled for a carpal tunnel and elbow surgery with Dr. Ellene Route. Nonetheless he has apparently given clearance to proceed with this surgery for the sacral region which is deemed more important at this point. Unfortunately the patient had a little bit of increased pain although he is having redness at this point there does not appear to be any spreading infection which is good news. No fevers, chills, nausea, or vomiting noted at this time. [name], [name] ([id number]) 12/03/18 on evaluation today patient actually appears to be doing about the same in regard to the sacral ulcer. He still waiting to hear back from Dr.  Ines Bloomer office in regard to scheduling his surgery. Fortunately there's no signs of systemic infection at this time which is good news. Unfortunately he really does not seem to making any progress the doxycycline does seem to at least be beneficial in helping to hold off the infection from worsening although unfortunately he still has a lot going on in this regard. Patient's daughter states she's actually gonna contact her office tomorrow to see what exactly is going on. 12/10/18 on evaluation today patient actually appears to be doing about the same in regard to her sacral room. Apparently he still waiting on approval from both cardiology as well as Dr. Ellene Route although apparently he's Artie gotten a formal letter from Dr. Ellene Route stated that he was clear to proceed with the sacral surgery. Di Kindle actually found the clearance letter from cardiology in epic as well today and this subsequently was given to the patient's daughter as that seems to be the only thing that was holding up proceeding with the surgery. Hopefully should be able to take that over to the surgeon's office today in order to go ahead and get this scheduled. 12/24/18 on evaluation today patient actually appears to be doing very well in regard to his sacral ulcer compared to when I last saw him. He has had surgery Dr. Lysle Pearl and it does appear that he was able to clear out this area of abscess very well without complication. This did extend down to the bone and a portion of the bone was sent for pathology and culture. Although on the pathology report it appears this is more tissue and not bone noted. With that being said the culture from the bone and Henrene Pastor asked him this was obtained revealed Corynebacterium striatum with no anaerobes isolated. Dr. Lysle Pearl did not place the patient on the antibiotics at this point. Again I have been debating on whether or not this is the patient to infectious disease I think that I am going to do that at  this point to gain their opinion on whether or not there's anything we should be treated in this regard and if so will be the best treatment options. 12/31/18 on evaluation today patient actually appears to be doing okay in regard to his sacral wound. Fortunately there does not appear to be any evidence of infection at this time which is good news. He has been tolerating the dressing's without complication. With that being said he did see infectious disease and  apparently they realize that the pathology samples sent from the operating room was actually. I'll see him and not bone and the question is whether the culture and sample or just contamination from the skin of not guilty and active infection. The daughter is very concerned about this she states that we may need to get a piece of bone to send for examination to ensure there's nothing more severe going on here. Obviously with this history of healing and then obsesses forming and then having to go for surgery now and reopening everything they have a reason to be concerned I completely agree. 01/07/19 on evaluation today patient actually appears to be doing well in regard to his sacral wound. Again we did get results back of his bone culture as well as the pathology which showed no evidence of osteomyelitis at this point this is excellent news. Overall I think that he likely does not need to go forward with the MRI since everything seems to be showing negative at this time. Especially in light of the fact that the wound appears to be doing well and that the infection and everything was running the wound bed seems to be dramatically improved. Patient's daughter is in agreement with this plan. 01/14/19 on evaluation today patient appears to be doing rather well at this time as far as the overall appearance of the sacral wound is concerned. The depth is slightly improved he has some granulation tissue noted there still is bone in the very bottom of the  wound bed although I'm no longer able to palpate this with my finger I can only tell by probing with the sterile cotton swab. Nonetheless I do believe this is shown signs of good improvement in healing as far as what I'm seeing at this point. 01/22/19 on evaluation today patient's wound in the sacral region appears to be doing rather well. Fortunately there does not seem to be any evidence of active infection at this time which is good news but patient overall has been doing excellent currently. I'm very pleased with how things have progressed over the past week. 01/28/19 on evaluation today patient actually appears to be doing very well in regard to his sacral wound. The area where the bone was exposed is closing in on becoming much more solid which is excellent news there does not appear to be any signs of active infection at this time also good news. Overall very pleased with how everything seems to be progressing. 02/04/19 on evaluation today patient appears to be doing much better in regard to the sacral wound. He has been tolerating the dressing changes without complication and though he is making some progress there's actually some hyper granulation noted at this point. I do think he may benefit from a dressing change we been using the Prisma with the alginate pack behind for some time I think we may want to switch to [facility] Dressing to see if this could be of benefit. 02/11/19 on evaluation today patient appears to be doing well in regard to his sacral wound. There does not appear to be any signs of active infection which is good news. There is some hyper granular tissue which we are attempting to address little by little with the [facility] Dressing although I do believe that many to try silver nitrate today as well. [name], [name] ([id number]) 02/18/19 on evaluation today patient appears to be doing well in regard to his sacral ulcer. He does still have the small hole  which goes  down deeper but I do need to work on cleaning out today. Nonetheless other than this he seems to be healing quite nicely and overall very pleased in this regard. I'm just having difficulty getting this region in particular to fill in as quickly and well as I would like. Electronic Signature(s) Signed: 02/18/2019 4:49:37 PM By: Worthy Keeler PA-C Entered By: Worthy Keeler on 02/18/2019 16:32:54 [name] ([id number]) -------------------------------------------------------------------------------- Physical Exam Details Patient Name: SEMAJE, KINKER Date of Service: 02/18/2019 3:30 PM Medical Record Number: [id number] Patient Account Number: [id number] Date of Birth/Sex: 30-Oct-1930 (85 y.o. M) Treating RN: Harold Barban Primary Care Provider: Burman Freestone Other Clinician: Referring Provider: Burman Freestone Treating Provider/Extender: STONE III, HOYT Weeks in Treatment: 57 Constitutional Well-nourished and well-hydrated in no acute distress. Respiratory normal breathing without difficulty. Psychiatric this patient is able to make decisions and demonstrates good insight into disease process. Alert and Oriented x 3. pleasant and cooperative. Notes Patient's wound bed currently showed signs of good granulation of [place] some Slough noted which was removed today without complication post debridement wound bed appears to be doing much better which is excellent news. Electronic Signature(s) Signed: 02/18/2019 4:49:37 PM By: Worthy Keeler PA-C Entered By: Worthy Keeler on 02/18/2019 16:33:21 [name] ([id number]) -------------------------------------------------------------------------------- Physician Orders Details Patient Name: VETO, MACQUEEN Date of Service: 02/18/2019 3:30 PM Medical Record Number: [id number] Patient Account Number: [id number] Date of Birth/Sex: 19-Mar-1931 (85 y.o. M) Treating RN: Harold Barban Primary Care Provider: Burman Freestone Other  Clinician: Referring Provider: Burman Freestone Treating Provider/Extender: [name], HOYT Weeks in Treatment: 12 Verbal / Phone Orders: No Diagnosis Coding ICD-10 Coding Code Description L89.154 Pressure ulcer of sacral region, stage 4 L02.31 Cutaneous abscess of buttock Wound Cleansing Wound #2 Midline Sacrum o Clean wound with Normal Saline. o May Shower, gently pat wound dry prior to applying new dressing. Anesthetic (add to Medication List) Wound #2 Midline Sacrum o Topical Lidocaine 4% cream applied to wound bed prior to debridement (In Clinic Only). Primary Wound Dressing Wound #2 Midline Sacrum o Hydrafera Blue Ready Transfer Secondary Dressing Wound #2 Midline Sacrum o Boardered Foam Dressing Dressing Change Frequency Wound #2 Midline Sacrum o Change dressing every other day. Follow-up Appointments Wound #2 Midline Sacrum o Return Appointment in 1 week. Off-Loading Wound #2 Midline Sacrum o Turn and reposition every 2 hours Electronic Signature(s) Signed: 02/18/2019 4:49:37 PM By: Worthy Keeler PA-C Signed: 02/21/2019 8:05:03 AM By: Harold Barban Entered By: Harold Barban on 02/18/2019 16:07:21 [name], [name] ([id number]) [name], [name] ([id number]) -------------------------------------------------------------------------------- Problem List Details Patient Name: LIEF, PALMATIER. Date of Service: 02/18/2019 3:30 PM Medical Record Number: [id number] Patient Account Number: [id number] Date of Birth/Sex: 02/02/1931 (85 y.o. 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 M) Treating RN: Harold Barban Primary Care Provider: Burman Freestone Other Clinician: Referring Provider: Burman Freestone  Treating Provider/Extender: [name], HOYT Weeks in Treatment: 12 Subjective Chief Complaint Information obtained from Patient Sacral pressure ulcer History of Present Illness (HPI) 02/06/18 on evaluation today patient presents for initial evaluation and our clinic concerning an issue which began roughly 3 weeks ago when the patient fell in his home on the floor in his kitchen and laid him down this detergent for roughly 3 days. He had a pressure injury to the left shoulder. This unfortunately has caused him a lot of discomfort although it finally seems to be doing better if anything is really having a lot of itching right now. This appears potentially be a contact dermatitis issue. He also has a significant pressure injury to the sacrum at this time as well which is also showing fascia exposure right over the bone but no evidence of bone exposure at this point which is good news. They have been using Santyl as well as Saline soaked gauze at this point in time. There does appear to be a lot of necrotic slough in the base of the wound. He does have a history of incontinence, myocardial infarction, and hypertension. He also is "borderline diabetic" hemoglobin A1c of 6.0. Currently he has some discomfort in the pressure site at the sacrum but fortunately nothing too significant this did require sharp debridement  today. 02/13/18 on evaluation today patient appears to be doing much better in regard to his sacral wound. He has been tolerating the dressing changes without complication with the Vashe. Fortunately there is no evidence of infection and though there is some Slough on the surface of the wound bed he has excellent granulation noted. Overall I'm pleased with how things have progressed in that regard. A glance at his shoulder as well and the rash seems to be someone improving in my pinion at this site as well. Overall I am pleased with what we're seeing and so is the family. 02/20/18 on evaluation today patient appears to be doing a little worse in regard to the sacral wound only in the fact that there is redness surrounding it has me somewhat concerned for infection. The drainage has also apparently been a little bit off color compared to normal according to family they did keep the dressing today that was removed to show me and I agree this seems to be a little bit different compared to what we have been seeing. Coupled with the redness I'm concerned he may be developing some cellulitis surrounding the wound bed. 02/27/18 on evaluation today patient presents for follow-up concerning his sacral ulcer. We have received the results back from his wound culture which shows unfortunately that the doxycycline will not be of benefit for him I am going to need to initiate treatment with something else in order to treat the pseudomonas. Otherwise he does not seem to be having any significant pain although his daughter states there are sometimes when he states having pain. We continue to use the Vashe currently. 03/06/18 on evaluation today patient's sacral wound appears to be doing better in my opinion. He has been tolerating the dressing changes without complication. With that being said the silver nitrate has helped with the prominent area of hyper granulation at the 6 o'clock location we will likely need to  repeat this again today. Nonetheless overall I am pleased with how things have improved over the last week. The erythema surrounding the wound seems to be greatly improved. 03/13/18 on evaluation today patient's wound actually does not appear to be terribly infected  although she does continue to have erythema surrounding the wound bed especially on the left border. I'm still somewhat concerned about the fact that the oral antibiotics alone may not be completely treating his infection. I previously discussed may need to go for IV antibiotic therapy I'm concerned that may be the case. We will need to make a referral today for infectious disease. 03/20/18 on evaluation today the patient sacral wound actually appears to be doing fairly well in regard to granulation although he continues unfortunately to have it your theme is surrounding the periwound region. There's also some increased swelling at the 6 o'clock location which also has me somewhat concerned. With that being said he does have some discomfort but [name], [name]. ([id number]) nothing too significant at this point. He still has not heard from infectious disease his daughter and wife are both present during the office visit today they're going to check back with this again. We did get the information for them to call them today. 03/27/18 on evaluation today patient is seen concerning his ongoing sacral ulcer. He has been tolerating the dressing changes without complication. With that being said he does present with evidence of bright green drainage noted on the dressing which again is something that I do often expect to see with a pseudomonas infection. He continues to have your theme is surrounding the wound bed as well and again I'm not 100% convinced this is just pressure related. I did speak with Colletta Maryland who is the nurse practitioner in [place] with infectious disease. I spoke with her actually yesterday concerning this patient. She is  not 100% convinced that this is infected. She question whether the wound may just be colonized with Pseudomonas and not actually causing an active infection. I am really not thinking that the edema is associated with pressure alone and again overall I don't feel that your theme and is consistent with a pressure injury either as he's never had any contusions noted like a deep tissue injury on his heel which was new and I did visualize today this was on the left heel. Nonetheless she wanted to give this a little bit more time and thought it would be appropriate to start the Wound VAC at this point. 04/03/18 on evaluation today patient sacral ulcer actually appears to be doing fairly well at this point. He has been tolerating the dressing changes without complication. With that being said I'm very pleased with the progress that has been made in regard to his sacral wound over the past week I do not see as much in the way of erythema which is great news. Nonetheless he does have a small area of hyper granulation unfortunately. This is at roughly the 7 o'clock location and I think does need to be addressed so that this will heal more appropriately. Nonetheless I think we may be ready to go ahead and initiate therapy with the Wound VAC. 04/10/18 on evaluation today patient appears to be doing excellent in regard to his sacral ulcer. The show signs of great improvement in overall I'm very pleased with how things look. He has been tolerating the dressing changes without complication. Specifically this is the Wound VAC. He also seems to be doing well with the antibiotic there is decreased your theme and redness surrounding the sacral area at this point in the wound has filled in quite significantly. 04/17/18 on evaluation today patient actually appears to be doing excellent in regard to his sacral ulcer. He's been tolerating the  dressing changes without complication specifically the Wound VAC. There really are  no major concerns from the patient nor family this point he is having no pain. He does have a little bit of Epiboly on the lateral portions of the wound where he does have a little bit more depth that will need to be addressed today. 04/23/18 on evaluation today patient's wound actually appears to be doing excellent at this point. He has been tolerating the Wound VAC and this appears to be doing well other than the fact that it seems to be breaking seal at the 6 o'clock location. I do believe that adding a duodenum dressing at this location try and help maintain the seal would be appropriate and likely very effective. With that being said he overall seems to be showing signs of good improvement at this point. There does not appear to be any evidence of significant infection which is also excellent news. 04/30/18 on evaluation today patient actually appears to be doing well in regard to his sacral ulcer. He's been tolerating the dressing changes without complication. Fortunately there does not appear to be any evidence of infection. Overall I'm very pleased with the progress that has been made up to this point. He does have some blistering underneath the draping unfortunately although this is definitely something that has been noted on other patients previously is a fairly common occurrence. Nonetheless the patient seems to be doing fairly well in general in my pinion based on what I see at this time. I do believe these are fairly superficial and minor. 05/07/18 on evaluation today patient's wound actually appears to be doing excellent at this point in time. He has been tolerating the Wound VAC decently well he states that it is somewhat cumbersome to carry around unfortunately. The only other issue he's been having according to family is that they been having a difficult time keeping the Wound VAC in place and doing what is supposed to do without making. Obviously I do think that this is definitely of  concern. Nonetheless I do believe she's made good progress up to this point. I'm very happy in that regard. 05/14/18 on evaluation today patient's wound continues to make good progress at this point. He had a minimal amount of slough noted on the surface which was easily wiped away with saline and gauze and in general I feel like that he is continuing to show excellent progress even with the discontinuation of the Wound VAC. Overall I'm pleased in this regard. He was having issues with the Wound VAC in getting it to seal I think that using the Prisma at this time has been equally efficient and getting the wound to diminish in size. 05/29/18- He is here in follow up evaluation for a sacral ulcer. There is improvement, we will continue with prisma and he will follow up in two weeks [name], [name] ([id number]) 06/11/18 on evaluation today patient had unfortunately bright green drainage on the dressing upon evaluation today. This is something that we have encountered before although we were able to get things under control previously with antibiotics. With that being said currently upon further inspection of the three areas of hyper granulation that were separate from the actual wound itself which was almost healed it really appears that these all have some depth to them. They are more tunnels that really have not closed or at least have reopened as a result likely of infection in my pinion. This is definitely not what I was  expecting or hoping for. 06/26/18 on evaluation today patient continues to experience issues with what appears to be small abscesses in the sacral region unfortunately. With that being said he has been tolerating the dressing changes without complication which is good news. He's not having any significant discomfort which is also good news. With that being said his daughter states that after he left last week that the packing that we have placed fell out quite rapidly and he  subsequently healed over very quick to the point they were not able to even repack the regions. Nonetheless there appear to be several fluctuance areas noted at this point there's one central region that does seem to be draining still discharge that is somewhat green in color. I did review the results of the wound culture which did show evidence of infection with both MRSA as well as pseudomonas based on that result. Nonetheless again with his other current medications we are not able to do the Cipro due to issues with potential long QT syndrome. I am going to give him a prescription for doxycycline in order to help with the potential MRSA infection. 07/09/18 on evaluation today patient actually appears to be doing about the same in regard to his sacral wound. He actually has his MRI scheduled for tomorrow and then subsequently is going to be having his infectious disease appointment for Thursday of this week. Fortunately he's not having any significant discomfort he still has your theme in the sacral region he still has several blister/flux went areas although they technically are not blisters this is more like a underlying abscess. The one area that is open still does probe down to bone. Again I am concerned about a deeper infection possibly even sacral osteomyelitis. 07/23/18 on evaluation today patient actually appears to be doing very well all things considered in regard to his sacral ulcer. Since I've last seen him he actually did have his MRI performed which showed that he has a complex fluid collection superficial to the distal sacrum measuring 5.9 x 4.4 x 2.8 cm which abuts the posterior aspect of the distal sacrum and the sacrum itself shows cortical destruction consistent with osteomyelitis. She has also been seen by infectious disease and currently orders have been initiated for IV antibiotic therapy for the next eight weeks. He was seen by Janene Madeira NP and placed on Ceftazidime and  Daptomycin. Currently he has not really been on this quite long enough to see a sufficient response to the new orders as far as antibiotic therapy is concerned. Nonetheless it does appear that he is likely on the right track at this point which is great news. Nonetheless the question which both Colletta Maryland and myself have discussed both with the patient and between ourselves is whether or not the patient may need to be seen by surgeon for surgical evacuation of the fluid collection/abscess and possible debridement of the sacrum itself. Nonetheless at this time my personal opinion is probably gonna be that we wait and get this at least a couple weeks to see the response that he receives with the IV antibiotic therapy. 08/06/18 on evaluation today patient actually appears to be doing rather well in regard to his sacral ulcer region all things considered. He has been tolerating the dressing changes without complication. With that being said there is not any obvious opening nor any drainage noted at this point in time upon evaluation. The patient has been tolerating the dressing changes though. He's doing well with the IV  antibiotic therapy. 08/20/18 on evaluation today patient appears to be doing wonderful in regard to his sacral region. In fact there appears to be no wound opening or drainage at this time. Overall I'm very pleased with how things have progressed. The patient likewise as well as his wife and daughter are very happy as well. Readmission: 11/20/18 on evaluation today patient presents for reevaluation our clinic concerning issues with his sacral region. Unfortunately the area in question is the same region which we previously treated what we thought would successfully back in November 2019. At that time the patient underwent IV antibiotic therapy which seemed to do the job very well. Subsequently however in the past couple of weeks he has begun to have drainage and bleeding from the sacral  region and is having increased pain yet again. Fortunately there is no signs of systemic infection although it does appear that the complex abscess that was previously noted may not have fully cleared there was a question between myself as well is infectious disease previous whether not he needed to see a surgeon being that things got better the family opted not to see a surgeon at that time. Nonetheless I am concerned that he may the need to see a surgeon in order to have this area surgically debrided and possibly a bone culture obtained in order to get a better idea of what we're treating and ensure that this is able to completely and fully heal. 11/27/18 on evaluation today patient appears to be doing about the same in regard to his sacral ulcer. He did see Dr. Viviano Simas, [name] ([id number]) yesterday and the plan is to proceed forward with surgery to clean out the region. This seems to be the most appropriate goal of treatment at this point. Dr. Lysle Pearl just needed to speak with Dr. Ellene Route the patient's neurologist in order to get clearance as the patient was supposed to be scheduled for a carpal tunnel and elbow surgery with Dr. Ellene Route. Nonetheless he has apparently given clearance to proceed with this surgery for the sacral region which is deemed more important at this point. Unfortunately the patient had a little bit of increased pain although he is having redness at this point there does not appear to be any spreading infection which is good news. No fevers, chills, nausea, or vomiting noted at this time. 12/03/18 on evaluation today patient actually appears to be doing about the same in regard to the sacral ulcer. He still waiting to hear back from Dr. Ines Bloomer office in regard to scheduling his surgery. Fortunately there's no signs of systemic infection at this time which is good news. Unfortunately he really does not seem to making any progress the doxycycline does seem to at least be  beneficial in helping to hold off the infection from worsening although unfortunately he still has a lot going on in this regard. Patient's daughter states she's actually gonna contact her office tomorrow to see what exactly is going on. 12/10/18 on evaluation today patient actually appears to be doing about the same in regard to her sacral room. Apparently he still waiting on approval from both cardiology as well as Dr. Ellene Route although apparently he's Artie gotten a formal letter from Dr. Ellene Route stated that he was clear to proceed with the sacral surgery. Di Kindle actually found the clearance letter from cardiology in epic as well today and this subsequently was given to the patient's daughter as that seems to be the only thing that was holding up  proceeding with the surgery. Hopefully should be able to take that over to the surgeon's office today in order to go ahead and get this scheduled. 12/24/18 on evaluation today patient actually appears to be doing very well in regard to his sacral ulcer compared to when I last saw him. He has had surgery Dr. Lysle Pearl and it does appear that he was able to clear out this area of abscess very well without complication. This did extend down to the bone and a portion of the bone was sent for pathology and culture. Although on the pathology report it appears this is more tissue and not bone noted. With that being said the culture from the bone and Henrene Pastor asked him this was obtained revealed Corynebacterium striatum with no anaerobes isolated. Dr. Lysle Pearl did not place the patient on the antibiotics at this point. Again I have been debating on whether or not this is the patient to infectious disease I think that I am going to do that at this point to gain their opinion on whether or not there's anything we should be treated in this regard and if so will be the best treatment options. 12/31/18 on evaluation today patient actually appears to be doing okay in regard to his  sacral wound. Fortunately there does not appear to be any evidence of infection at this time which is good news. He has been tolerating the dressing's without complication. With that being said he did see infectious disease and apparently they realize that the pathology samples sent from the operating room was actually. I'll see him and not bone and the question is whether the culture and sample or just contamination from the skin of not guilty and active infection. The daughter is very concerned about this she states that we may need to get a piece of bone to send for examination to ensure there's nothing more severe going on here. Obviously with this history of healing and then obsesses forming and then having to go for surgery now and reopening everything they have a reason to be concerned I completely agree. 01/07/19 on evaluation today patient actually appears to be doing well in regard to his sacral wound. Again we did get results back of his bone culture as well as the pathology which showed no evidence of osteomyelitis at this point this is excellent news. Overall I think that he likely does not need to go forward with the MRI since everything seems to be showing negative at this time. Especially in light of the fact that the wound appears to be doing well and that the infection and everything was running the wound bed seems to be dramatically improved. Patient's daughter is in agreement with this plan. 01/14/19 on evaluation today patient appears to be doing rather well at this time as far as the overall appearance of the sacral wound is concerned. The depth is slightly improved he has some granulation tissue noted there still is bone in the very bottom of the wound bed although I'm no longer able to palpate this with my finger I can only tell by probing with the sterile cotton swab. Nonetheless I do believe this is shown signs of good improvement in healing as far as what I'm seeing at this  point. 01/22/19 on evaluation today patient's wound in the sacral region appears to be doing rather well. Fortunately there does not seem to be any evidence of active infection at this time which is good news but patient overall has been  doing excellent currently. I'm very pleased with how things have progressed over the past week. 01/28/19 on evaluation today patient actually appears to be doing very well in regard to his sacral wound. The area where the bone was exposed is closing in on becoming much more solid which is excellent news there does not appear to be any signs of active infection at this time also good news. Overall very pleased with how everything seems to be progressing. 02/04/19 on evaluation today patient appears to be doing much better in regard to the sacral wound. He has been tolerating the dressing changes without complication and though he is making some progress there's actually some hyper granulation noted at this point. I do think he may benefit from a dressing change we been using the Prisma with the alginate pack behind [name], [name]. ([id number]) for some time I think we may want to switch to [facility] Dressing to see if this could be of benefit. 02/11/19 on evaluation today patient appears to be doing well in regard to his sacral wound. There does not appear to be any signs of active infection which is good news. There is some hyper granular tissue which we are attempting to address little by little with the [facility] Dressing although I do believe that many to try silver nitrate today as well. 02/18/19 on evaluation today patient appears to be doing well in regard to his sacral ulcer. He does still have the small hole which goes down deeper but I do need to work on cleaning out today. Nonetheless other than this he seems to be healing quite nicely and overall very pleased in this regard. I'm just having difficulty getting this region in particular to fill in as  quickly and well as I would like. Patient History Information obtained from Patient. Family History Cancer - Paternal Grandparents, Diabetes - Father, Heart Disease - Mother,Father, Stroke - Father, No family history of Hypertension, Kidney Disease, Lung Disease, Seizures, Thyroid Problems, Tuberculosis. Social History Never smoker, Marital Status - Widowed, Alcohol Use - Never, Drug Use - No History, Caffeine Use - Daily. Medical History Eyes Patient has history of Cataracts - bilateral removal Denies history of Glaucoma, Optic Neuritis Ear/Nose/Mouth/Throat Denies history of Chronic sinus problems/congestion, Middle ear problems Hematologic/Lymphatic Denies history of Anemia, Hemophilia, Human Immunodeficiency Virus, Lymphedema, Sickle Cell Disease Respiratory Patient has history of Asthma Denies history of Aspiration, Chronic Obstructive Pulmonary Disease (COPD), Pneumothorax, Sleep Apnea, Tuberculosis Cardiovascular Patient has history of Angina, Arrhythmia, Coronary Artery Disease, Hypertension, Myocardial Infarction - 2001 Denies history of Congestive Heart Failure, Deep Vein Thrombosis, Hypotension, Peripheral Arterial Disease, Peripheral Venous Disease, Phlebitis, Vasculitis Gastrointestinal Denies history of Cirrhosis , Colitis, Crohn s, Hepatitis A, Hepatitis B, Hepatitis C Endocrine Denies history of Type I Diabetes, Type II Diabetes Genitourinary Denies history of End Stage Renal Disease Immunological Denies history of Lupus Erythematosus, Raynaud s, Scleroderma Integumentary (Skin) Denies history of History of Burn, History of pressure wounds Musculoskeletal Patient has history of Osteoarthritis Denies history of Gout, Rheumatoid Arthritis, Osteomyelitis Neurologic Patient has history of Dementia, Neuropathy Denies history of Quadriplegia, Paraplegia, Seizure Disorder Oncologic Denies history of Received Chemotherapy, Received Radiation Psychiatric Denies  history of Anorexia/bulimia, Confinement Anxiety Hospitalization/Surgery History - Fall. [name], [name] ([id number]) Medical And Surgical History Notes Endocrine Borderline Oncologic Melanoma on back Review of Systems (ROS) Constitutional Symptoms (General Health) Denies complaints or symptoms of Fatigue, Fever, Chills, Marked Weight Change. Respiratory Denies complaints or symptoms of Chronic or frequent coughs,  Shortness of Breath. Cardiovascular Denies complaints or symptoms of Chest pain, LE edema. Psychiatric Denies complaints or symptoms of Anxiety, Claustrophobia. Objective Constitutional Well-nourished and well-hydrated in no acute distress. Vitals Time Taken: 3:55 PM, Height: 69 in, Weight: 170.6 lbs, BMI: 25.2, Temperature: 98.1 F, Pulse: 55 bpm, Respiratory Rate: 16 breaths/min, Blood Pressure: 173/62 mmHg. Respiratory normal breathing without difficulty. Psychiatric this patient is able to make decisions and demonstrates good insight into disease process. Alert and Oriented x 3. pleasant and cooperative. General Notes: Patient's wound bed currently showed signs of good granulation of [place] some Slough noted which was removed today without complication post debridement wound bed appears to be doing much better which is excellent news. Integumentary (Hair, Skin) Wound #2 status is Open. Original cause of wound was Gradually Appeared. The wound is located on the Midline Sacrum. The wound measures 1.8cm length x 0.4cm width x 0.8cm depth; 0.565cm^2 area and 0.452cm^3 volume. There is Fat Layer (Subcutaneous Tissue) Exposed exposed. There is no tunneling or undermining noted. There is a medium amount of purulent drainage noted. The wound margin is indistinct and nonvisible. There is medium (34-66%) pale, hyper - granulation within the wound bed. There is a medium (34-66%) amount of necrotic tissue within the wound bed including Adherent Slough. The periwound skin appearance  exhibited: Scarring, Erythema. The periwound skin appearance did not exhibit: Callus, Crepitus, Excoriation, Induration, Rash, Dry/Scaly, Maceration, Atrophie Blanche, Cyanosis, Ecchymosis, Hemosiderin Staining, Mottled, Pallor, Rubor. The surrounding wound skin color is noted with erythema which is circumferential. Periwound temperature was noted as No Abnormality. The periwound has tenderness on palpation. [name], [name] ([id number]) Assessment Active Problems ICD-10 Pressure ulcer of sacral region, stage 4 Cutaneous abscess of buttock Procedures Wound #2 Pre-procedure diagnosis of Wound #2 is a Pressure Ulcer located on the Midline Sacrum . There was a Excisional Skin/Subcutaneous Tissue Debridement with a total area of 0.72 sq cm performed by STONE III, HOYT E., PA-C. With the following instrument(s): Curette to remove Viable and Non-Viable tissue/material. Material removed includes Subcutaneous Tissue, Slough, Skin: Epidermis, and Fibrin/Exudate after achieving pain control using Lidocaine. No specimens were taken. A time out was conducted at 16:03, prior to the start of the procedure. A Minimum amount of bleeding was controlled with N/A. The procedure was tolerated well with a pain level of 0 throughout and a pain level of 0 following the procedure. Post Debridement Measurements: 1.8cm length x 0.4cm width x 0.8cm depth; 0.452cm^3 volume. Post debridement Stage noted as Category/Stage IV. Character of Wound/Ulcer Post Debridement is improved. Post procedure Diagnosis Wound #2: Same as Pre-Procedure Plan Wound Cleansing: Wound #2 Midline Sacrum: Clean wound with Normal Saline. May Shower, gently pat wound dry prior to applying new dressing. Anesthetic (add to Medication List): Wound #2 Midline Sacrum: Topical Lidocaine 4% cream applied to wound bed prior to debridement (In Clinic Only). Primary Wound Dressing: Wound #2 Midline Sacrum: Hydrafera Blue Ready Transfer Secondary  Dressing: Wound #2 Midline Sacrum: Boardered Foam Dressing Dressing Change Frequency: Wound #2 Midline Sacrum: Change dressing every other day. Follow-up Appointments: Wound #2 Midline Sacrum: Return Appointment in 1 week. Off-Loading: Wound #2 Midline Sacrum: Turn and reposition every 2 hours ALIOUNE, HODGKIN ([id number]) I'm in a recommend that we continue with the above wound care measures for the next week and the patient is in agreement with plan. We will subsequently see were things stand at follow-up. If anything changes worsens the meantime he will let me know. Please see above for specific wound  care orders. We will see patient for re-evaluation in 1 week(s) here in the clinic. If anything worsens or changes patient will contact our office for additional recommendations. Electronic Signature(s) Signed: 02/18/2019 4:49:37 PM By: Worthy Keeler PA-C Entered By: Worthy Keeler on 02/18/2019 16:33:34 [name] ([id number]) -------------------------------------------------------------------------------- ROS/PFSH Details Patient Name: [name] Date of Service: 02/18/2019 3:30 PM Medical Record Number: [id number] Patient Account Number: [id number] Date of Birth/Sex: [birth year] (85 y.o. M) Treating RN: Harold Barban Primary Care Provider: Burman Freestone Other Clinician: Referring Provider: Burman Freestone Treating Provider/Extender: [name], HOYT Weeks in Treatment: 12 Information Obtained From Patient Constitutional Symptoms (General Health) Complaints and Symptoms: Negative for: Fatigue; Fever; Chills; Marked Weight Change Respiratory Complaints and Symptoms: Negative for: Chronic or frequent coughs; Shortness of Breath Medical History: Positive for: Asthma Negative for: Aspiration; Chronic Obstructive Pulmonary Disease (COPD); Pneumothorax; Sleep Apnea; Tuberculosis Cardiovascular Complaints and Symptoms: Negative for: Chest pain; LE edema Medical  History: Positive for: Angina; Arrhythmia; Coronary Artery Disease; Hypertension; Myocardial Infarction - 2001 Negative for: Congestive Heart Failure; Deep Vein Thrombosis; Hypotension; Peripheral Arterial Disease; Peripheral Venous Disease; Phlebitis; Vasculitis Psychiatric Complaints and Symptoms: Negative for: Anxiety; Claustrophobia Medical History: Negative for: Anorexia/bulimia; Confinement Anxiety Eyes Medical History: Positive for: Cataracts - bilateral removal Negative for: Glaucoma; Optic Neuritis Ear/Nose/Mouth/Throat Medical History: Negative for: Chronic sinus problems/congestion; Middle ear problems Hematologic/Lymphatic Medical History: Negative for: Anemia; Hemophilia; Human Immunodeficiency Virus; Lymphedema; Sickle Cell Disease COREYON, NICOTRA ([id number]) Gastrointestinal Medical History: Negative for: Cirrhosis ; Colitis; Crohnos; Hepatitis A; Hepatitis B; Hepatitis C Endocrine Medical History: Negative for: Type I Diabetes; Type II Diabetes Past Medical History Notes: Borderline Genitourinary Medical History: Negative for: End Stage Renal Disease Immunological Medical History: Negative for: Lupus Erythematosus; Raynaudos; Scleroderma Integumentary (Skin) Medical History: Negative for: History of Burn; History of pressure wounds Musculoskeletal Medical History: Positive for: Osteoarthritis Negative for: Gout; Rheumatoid Arthritis; Osteomyelitis Neurologic Medical History: Positive for: Dementia; Neuropathy Negative for: Quadriplegia; Paraplegia; Seizure Disorder Oncologic Medical History: Negative for: Received Chemotherapy; Received Radiation Past Medical History Notes: Melanoma on back HBO Extended History Items Eyes: Cataracts Immunizations Pneumococcal Vaccine: Received Pneumococcal Vaccination: Yes Implantable Devices No devices added Hospitalization / Surgery History Type of Hospitalization/Surgery CHRISTIAN, BORGERDING  ([id number]) Fall Family and Social History Cancer: Yes - Paternal Grandparents; Diabetes: Yes - Father; Heart Disease: Yes - Mother,Father; Hypertension: No; Kidney Disease: No; Lung Disease: No; Seizures: No; Stroke: Yes - Father; Thyroid Problems: No; Tuberculosis: No; Never smoker; Marital Status - Widowed; Alcohol Use: Never; Drug Use: No History; Caffeine Use: Daily; Financial Concerns: No; Food, Clothing or Shelter Needs: No; Support System Lacking: No; Transportation Concerns: No Physician Affirmation I have reviewed and agree with the above information. Electronic Signature(s) Signed: 02/18/2019 4:49:37 PM By: Worthy Keeler PA-C Signed: 02/21/2019 8:05:03 AM By: Harold Barban Entered By: Worthy Keeler on 02/18/2019 16:33:09 [name] ([id number]) -------------------------------------------------------------------------------- SuperBill Details Patient Name: [name] Date of Service: 02/18/2019 Medical Record Number: [id number] Patient Account Number: [id number] Date of Birth/Sex: [birth year] (85 y.o. M) Treating RN: Harold Barban Primary Care Provider: Burman Freestone Other Clinician: Referring Provider: Burman Freestone Treating Provider/Extender: [name], HOYT Weeks in Treatment: 12 Diagnosis Coding ICD-10 Codes Code Description L89.154 Pressure ulcer of sacral region, stage 4 L02.31 Cutaneous abscess of buttock Facility Procedures CPT4 Code: [id number] Description: 55732 - DEB SUBQ TISSUE 20 SQ CM/< ICD-10 Diagnosis Description L89.154 Pressure ulcer of sacral region, stage 4 Modifier: Quantity: 1 Physician Procedures CPT4 Code: [id number]  Description: 11042 - WC PHYS SUBQ TISS 20 SQ CM ICD-10 Diagnosis Description L89.154 Pressure ulcer of sacral region, stage 4 Modifier: Quantity: 1 Electronic Signature(s) Signed: 02/18/2019 4:49:37 PM By: Worthy Keeler PA-C Entered By: Worthy Keeler on 02/18/2019 16:33:41

## 2019-02-25 ENCOUNTER — Encounter: Payer: Medicare Other | Admitting: Physician Assistant

## 2019-02-25 ENCOUNTER — Other Ambulatory Visit: Payer: Self-pay | Admitting: Internal Medicine

## 2019-02-25 ENCOUNTER — Other Ambulatory Visit: Payer: Self-pay

## 2019-02-25 DIAGNOSIS — L89154 Pressure ulcer of sacral region, stage 4: Secondary | ICD-10-CM | POA: Diagnosis not present

## 2019-02-25 NOTE — Progress Notes (Signed)
[name], [name] ([id number]) Visit Report for 02/25/2019 Arrival Information Details Patient Name: [name], [name]. Date of Service: 02/25/2019 3:30 PM Medical Record Number: [id number] Patient Account Number: [id number] Date of Birth/Sex: [birth year] (85 y.o. M) Treating RN: Cornell Barman Primary Care Santez Woodcox: Burman Freestone Other Clinician: Referring Jahlani Lorentz: Burman Freestone Treating Jasira Robinson/Extender: [name], HOYT Weeks in Treatment: 13 Visit Information History Since Last Visit Added or deleted any medications: No Patient Arrived: [place] Any new allergies or adverse reactions: No Arrival Time: 15:23 Had a fall or experienced change in No Accompanied By: self activities of daily living that may affect Transfer Assistance: None risk of falls: Patient Identification Verified: Yes Signs or symptoms of abuse/neglect since last visito No Secondary Verification Process Yes Hospitalized since last visit: No Completed: Implantable device outside of the clinic excluding No Patient Has Alerts: Yes cellular tissue based products placed in the center Patient Alerts: Patient on Blood since last visit: Thinner Pain Present Now: No Electronic Signature(s) Signed: 02/25/2019 5:03:03 PM By: Gretta Cool, BSN, RN, CWS, Kim RN, BSN Entered By: Gretta Cool, BSN, RN, CWS, Kim on 02/25/2019 15:28:29 [name] ([id number]) -------------------------------------------------------------------------------- Clinic Level of Care Assessment Details Patient Name: [name], [name]. Date of Service: 02/25/2019 3:30 PM Medical Record Number: [id number] Patient Account Number: [id number] Date of Birth/Sex: [birth year] (85 y.o. M) Treating RN: Harold Barban Primary Care Clothilde Tippetts: Burman Freestone Other Clinician: Referring Rubie Ficco: Burman Freestone Treating Madsen Riddle/Extender: [name], HOYT Weeks in Treatment: 13 Clinic Level of Care Assessment Items TOOL 4 Quantity Score []  - Use when only an EandM is  performed on FOLLOW-UP visit 0 ASSESSMENTS - Nursing Assessment / Reassessment X - Reassessment of Co-morbidities (includes updates in patient status) 1 10 X- 1 5 Reassessment of Adherence to Treatment Plan ASSESSMENTS - Wound and Skin Assessment / Reassessment X - Simple Wound Assessment / Reassessment - one wound 1 5 []  - 0 Complex Wound Assessment / Reassessment - multiple wounds []  - 0 Dermatologic / Skin Assessment (not related to wound area) ASSESSMENTS - Focused Assessment []  - Circumferential Edema Measurements - multi extremities 0 []  - 0 Nutritional Assessment / Counseling / Intervention []  - 0 Lower Extremity Assessment (monofilament, tuning fork, pulses) []  - 0 Peripheral Arterial Disease Assessment (using hand held doppler) ASSESSMENTS - Ostomy and/or Continence Assessment and Care []  - Incontinence Assessment and Management 0 []  - 0 Ostomy Care Assessment and Management (repouching, etc.) PROCESS - Coordination of Care X - Simple Patient / Family Education for ongoing care 1 15 []  - 0 Complex (extensive) Patient / Family Education for ongoing care []  - 0 Staff obtains Programmer, systems, Records, Test Results / Process Orders []  - 0 Staff telephones HHA, Nursing Homes / Clarify orders / etc []  - 0 Routine Transfer to another Facility (non-emergent condition) []  - 0 Routine Hospital Admission (non-emergent condition) []  - 0 New Admissions / Biomedical engineer / Ordering NPWT, Apligraf, etc. []  - 0 Emergency Hospital Admission (emergent condition) X- 1 10 Simple Discharge Coordination VARTAN, KERINS ([id number]) []  - 0 Complex (extensive) Discharge Coordination PROCESS - Special Needs []  - Pediatric / Minor Patient Management 0 []  - 0 Isolation Patient Management []  - 0 Hearing / Language / Visual special needs []  - 0 Assessment of Community assistance (transportation, D/C planning, etc.) []  - 0 Additional assistance / Altered mentation []  - 0 Support  Surface(s) Assessment (bed, cushion, seat, etc.) INTERVENTIONS - Wound Cleansing / Measurement X - Simple Wound Cleansing - one wound 1 5 []  - 0  Complex Wound Cleansing - multiple wounds X- 1 5 Wound Imaging (photographs - any number of wounds) []  - 0 Wound Tracing (instead of photographs) X- 1 5 Simple Wound Measurement - one wound []  - 0 Complex Wound Measurement - multiple wounds INTERVENTIONS - Wound Dressings X - Small Wound Dressing one or multiple wounds 1 10 []  - 0 Medium Wound Dressing one or multiple wounds []  - 0 Large Wound Dressing one or multiple wounds []  - 0 Application of Medications - topical []  - 0 Application of Medications - injection INTERVENTIONS - Miscellaneous []  - External ear exam 0 []  - 0 Specimen Collection (cultures, biopsies, blood, body fluids, etc.) []  - 0 Specimen(s) / Culture(s) sent or taken to Lab for analysis []  - 0 Patient Transfer (multiple staff / Civil Service fast streamer / Similar devices) []  - 0 Simple Staple / Suture removal (25 or less) []  - 0 Complex Staple / Suture removal (26 or more) []  - 0 Hypo / Hyperglycemic Management (close monitor of Blood Glucose) []  - 0 Ankle / Brachial Index (ABI) - do not check if billed separately X- 1 5 Vital Signs RENNY, REMER ([id number]) Has the patient been seen at the hospital within the last three years: Yes Total Score: 75 Level Of Care: New/Established - Level 2 Electronic Signature(s) Signed: 02/25/2019 4:53:18 PM By: Harold Barban Entered By: Harold Barban on 02/25/2019 15:49:31 [name] ([id number]) -------------------------------------------------------------------------------- Lower Extremity Assessment Details Patient Name: [name] Date of Service: 02/25/2019 3:30 PM Medical Record Number: [id number] Patient Account Number: [id number] Date of Birth/Sex: Nov 02, 1930 (85 y.o. M) Treating RN: Cornell Barman Primary Care Lyza Houseworth: Burman Freestone Other  Clinician: Referring Emali Heyward: Burman Freestone Treating Cristin Szatkowski/Extender: Sharalyn Ink in Treatment: 13 Electronic Signature(s) Signed: 02/25/2019 5:03:03 PM By: Gretta Cool, BSN, RN, CWS, Kim RN, BSN Entered By: Gretta Cool, BSN, RN, CWS, Kim on 02/25/2019 15:35:20 [name] ([id number]) -------------------------------------------------------------------------------- Multi Wound Chart Details Patient Name: [name] Date of Service: 02/25/2019 3:30 PM Medical Record Number: [id number] Patient Account Number: [id number] Date of Birth/Sex: [birth year] (85 y.o. M) Treating RN: Harold Barban Primary Care Magalie Almon: Burman Freestone Other Clinician: Referring Beva Remund: Burman Freestone Treating Trinidee Schrag/Extender: [name], HOYT Weeks in Treatment: 13 Vital Signs Height(in): 69 Pulse(bpm): 73 Weight(lbs): 170.6 Blood Pressure(mmHg): 160/72 Body Mass Index(BMI): 25 Temperature(F): 98.0 Respiratory Rate 16 (breaths/min): Photos: [N/A:N/A] Wound Location: Sacrum - Midline N/A N/A Wounding Event: Gradually Appeared N/A N/A Primary Etiology: Pressure Ulcer N/A N/A Secondary Etiology: Abscess N/A N/A Comorbid History: Cataracts, Asthma, Angina, N/A N/A Arrhythmia, Coronary Artery Disease, Hypertension, Myocardial Infarction, Osteoarthritis, Dementia, Neuropathy Date Acquired: 10/30/2018 N/A N/A Weeks of Treatment: 13 N/A N/A Wound Status: Open N/A N/A Measurements L x W x D 0.8x0.4x0.8 N/A N/A (cm) Area (cm) : 0.251 N/A N/A Volume (cm) : 0.201 N/A N/A % Reduction in Area: -3037.50% N/A N/A % Reduction in Volume: -905.00% N/A N/A Classification: Category/Stage IV N/A N/A Exudate Amount: Medium N/A N/A Exudate Type: Purulent N/A N/A Exudate Color: yellow, brown, green N/A N/A Wound Margin: Indistinct, nonvisible N/A N/A Granulation Amount: Medium (34-66%) N/A N/A Granulation Quality: Pale, Hyper-granulation N/A N/A Necrotic Amount: Small (1-33%) N/A  N/A Exposed Structures: Fat Layer (Subcutaneous N/A N/A Tissue) Exposed: Yes MORDECHAI, MATUSZAK ([id number]) Fascia: No Tendon: No Muscle: No Joint: No Bone: No Epithelialization: Medium (34-66%) N/A N/A Periwound Skin Texture: Scarring: Yes N/A N/A Excoriation: No Induration: No Callus: No Crepitus: No Rash: No Periwound Skin Moisture: Maceration: No N/A N/A Dry/Scaly: No Periwound  Skin Color: Erythema: Yes N/A N/A Atrophie Blanche: No Cyanosis: No Ecchymosis: No Hemosiderin Staining: No Mottled: No Pallor: No Rubor: No Erythema Location: Circumferential N/A N/A Temperature: No Abnormality N/A N/A Tenderness on Palpation: Yes N/A N/A Treatment Notes Electronic Signature(s) Signed: 02/25/2019 4:53:18 PM By: Harold Barban Entered By: Harold Barban on 02/25/2019 15:46:35 [name] ([id number]) -------------------------------------------------------------------------------- [place] Details Patient Name: [name] Date of Service: 02/25/2019 3:30 PM Medical Record Number: [id number] Patient Account Number: [id number] Date of Birth/Sex: 09-26-1931 (85 y.o. M) Treating RN: Harold Barban Primary Care Mikella Linsley: Burman Freestone Other Clinician: Referring Jacobi Ryant: Burman Freestone Treating Daine Gunther/Extender: [name], HOYT Weeks in Treatment: 13 Active Inactive Abuse / Safety / Falls / Self Care Management Nursing Diagnoses: Potential for falls Goals: Patient will not experience any injury related to falls Date Initiated: 11/20/2018 Target Resolution Date: 02/15/2019 Goal Status: Active Interventions: Assess fall risk on admission and as needed Notes: Orientation to the Wound Care Program Nursing Diagnoses: Knowledge deficit related to the wound healing center program Goals: Patient/caregiver will verbalize understanding of the [place] Program Date Initiated: 11/20/2018 Target Resolution Date: 02/15/2019 Goal Status:  Active Interventions: Provide education on orientation to the wound center Notes: Wound/Skin Impairment Nursing Diagnoses: Impaired tissue integrity Goals: Ulcer/skin breakdown will heal within 14 weeks Date Initiated: 11/20/2018 Target Resolution Date: 02/15/2019 Goal Status: Active Interventions: Assess patient/caregiver ability to obtain necessary supplies KROY, SPRUNG ([id number]) Assess patient/caregiver ability to perform ulcer/skin care regimen upon admission and as needed Assess ulceration(s) every visit Notes: Electronic Signature(s) Signed: 02/25/2019 4:53:18 PM By: Harold Barban Entered By: Harold Barban on 02/25/2019 15:46:16 [name] ([id number]) -------------------------------------------------------------------------------- Pain Assessment Details Patient Name: [name] Date of Service: 02/25/2019 3:30 PM Medical Record Number: [id number] Patient Account Number: [id number] Date of Birth/Sex: 04-13-1931 (85 y.o. M) Treating RN: Cornell Barman Primary Care Ashante Yellin: Burman Freestone Other Clinician: Referring Kimberlin Scheel: Burman Freestone Treating Sahil Milner/Extender: [name], HOYT Weeks in Treatment: 13 Active Problems Location of Pain Severity and Description of Pain Patient Has Paino No Site Locations Pain Management and Medication Current Pain Management: Notes patient denies pain a this time Electronic Signature(s) Signed: 02/25/2019 5:03:03 PM By: Gretta Cool, BSN, RN, CWS, Kim RN, BSN Entered By: Gretta Cool, BSN, RN, CWS, Kim on 02/25/2019 15:28:43 [name] ([id number]) -------------------------------------------------------------------------------- Patient/Caregiver Education Details Patient Name: [name] Date of Service: 02/25/2019 3:30 PM Medical Record Number: [id number] Patient Account Number: [id number] Date of Birth/Gender: 14-Aug-1931 (85 y.o. M) Treating RN: Harold Barban Primary Care Physician: Burman Freestone Other  Clinician: Referring Physician: Burman Freestone Treating Physician/Extender: Sharalyn Ink in Treatment: 13 Education Assessment Education Provided To: Patient Education Topics Provided Wound/Skin Impairment: Handouts: Caring for Your Ulcer Methods: Demonstration, Explain/Verbal Responses: State content correctly Electronic Signature(s) Signed: 02/25/2019 4:53:18 PM By: Harold Barban Entered By: Harold Barban on 02/25/2019 15:46:49 [name] ([id number]) -------------------------------------------------------------------------------- Wound Assessment Details Patient Name: [name] Date of Service: 02/25/2019 3:30 PM Medical Record Number: [id number] Patient Account Number: [id number] Date of Birth/Sex: 07-Jan-1931 (85 y.o. M) Treating RN: Cornell Barman Primary Care Rashel Okeefe: Burman Freestone Other Clinician: Referring Nicci Vaughan: Burman Freestone Treating Scorpio Fortin/Extender: [name], HOYT Weeks in Treatment: 13 Wound Status Wound Number: 2 Primary Pressure Ulcer Etiology: Wound Location: Sacrum - Midline Secondary Abscess Wounding Event: Gradually Appeared Etiology: Date Acquired: 10/30/2018 Wound Open Weeks Of Treatment: 13 Status: Clustered Wound: No Comorbid Cataracts, Asthma, Angina, Arrhythmia, History: Coronary Artery Disease, Hypertension, Myocardial Infarction, Osteoarthritis, Dementia, Neuropathy Photos Wound  Measurements Length: (cm) 0.8 Width: (cm) 0.4 Depth: (cm) 0.8 Area: (cm) 0.251 Volume: (cm) 0.201 % Reduction in Area: -3037.5% % Reduction in Volume: -905% Epithelialization: Medium (34-66%) Tunneling: No Wound Description Classification: Category/Stage IV Foul Odor A Wound Margin: Indistinct, nonvisible Slough/Fibr Exudate Amount: Medium Exudate Type: Purulent Exudate Color: yellow, brown, green fter Cleansing: No ino No Wound Bed Granulation Amount: Medium (34-66%) Exposed Structure Granulation Quality: Pale,  Hyper-granulation Fascia Exposed: No Necrotic Amount: Small (1-33%) Fat Layer (Subcutaneous Tissue) Exposed: Yes Necrotic Quality: Adherent Slough Tendon Exposed: No Muscle Exposed: No Joint Exposed: No Bone Exposed: No DEO, MEHRINGER ([id number]) Periwound Skin Texture Texture Color No Abnormalities Noted: No No Abnormalities Noted: No Callus: No Atrophie Blanche: No Crepitus: No Cyanosis: No Excoriation: No Ecchymosis: No Induration: No Erythema: Yes Rash: No Erythema Location: Circumferential Scarring: Yes Hemosiderin Staining: No Mottled: No Moisture Pallor: No No Abnormalities Noted: No Rubor: No Dry / Scaly: No Maceration: No Temperature / Pain Temperature: No Abnormality Tenderness on Palpation: Yes Electronic Signature(s) Signed: 02/25/2019 5:03:03 PM By: Gretta Cool, BSN, RN, CWS, Kim RN, BSN Entered By: Gretta Cool, BSN, RN, CWS, Kim on 02/25/2019 15:35:09 [name] ([id number]) -------------------------------------------------------------------------------- Vitals Details Patient Name: [name] Date of Service: 02/25/2019 3:30 PM Medical Record Number: [id number] Patient Account Number: [id number] Date of Birth/Sex: [birth year] (85 y.o. M) Treating RN: Cornell Barman Primary Care Toiya Morrish: Burman Freestone Other Clinician: Referring Ki Luckman: Burman Freestone Treating Lonnie Rosado/Extender: [name], HOYT Weeks in Treatment: 13 Vital Signs Time Taken: 15:28 Temperature (F): 98.0 Height (in): 69 Pulse (bpm): 66 Weight (lbs): 170.6 Respiratory Rate (breaths/min): 16 Body Mass Index (BMI): 25.2 Blood Pressure (mmHg): 160/72 Reference Range: 80 - 120 mg / dl Electronic Signature(s) Signed: 02/25/2019 5:03:03 PM By: Gretta Cool, BSN, RN, CWS, Kim RN, BSN Entered By: Gretta Cool, BSN, RN, CWS, Kim on 02/25/2019 15:29:34

## 2019-02-26 NOTE — Progress Notes (Signed)
CAGE, GUPTON ([id number]) Visit Report for 02/25/2019 Chief Complaint Document Details Patient Name: [name], [name]. Date of Service: 02/25/2019 3:30 PM Medical Record Number: [id number] Patient Account Number: [id number] Date of Birth/Sex: [birth year] (85 y.o. M) Treating RN: Harold Barban Primary Care Provider: Burman Freestone Other Clinician: Referring Provider: Burman Freestone Treating Provider/Extender: [name], Kana Reimann Weeks in Treatment: 13 Information Obtained from: Patient Chief Complaint Sacral pressure ulcer Electronic Signature(s) Signed: 02/25/2019 5:03:42 PM By: Worthy Keeler PA-C Entered By: Worthy Keeler on 02/25/2019 15:35:49 [name] ([id number]) -------------------------------------------------------------------------------- HPI Details Patient Name: [name] Date of Service: 02/25/2019 3:30 PM Medical Record Number: [id number] Patient Account Number: [id number] Date of Birth/Sex: 1930/12/05 (85 y.o. M) Treating RN: Harold Barban Primary Care Provider: Burman Freestone Other Clinician: Referring Provider: Burman Freestone Treating Provider/Extender: [name], [place] Ganoe Weeks in Treatment: 13 History of Present Illness HPI Description: 02/06/18 on evaluation today patient presents for initial evaluation and our clinic concerning an issue which began roughly 3 weeks ago when the patient fell in his home on the floor in his kitchen and laid him down this detergent for roughly 3 days. He had a pressure injury to the left shoulder. This unfortunately has caused him a lot of discomfort although it finally seems to be doing better if anything is really having a lot of itching right now. This appears potentially be a contact dermatitis issue. He also has a significant pressure injury to the sacrum at this time as well which is also showing fascia exposure right over the bone but no evidence of bone exposure at this point which is good news. They have  been using Santyl as well as Saline soaked gauze at this point in time. There does appear to be a lot of necrotic slough in the base of the wound. He does have a history of incontinence, myocardial infarction, and hypertension. He also is "borderline diabetic" hemoglobin A1c of 6.0. Currently he has some discomfort in the pressure site at the sacrum but fortunately nothing too significant this did require sharp debridement today. 02/13/18 on evaluation today patient appears to be doing much better in regard to his sacral wound. He has been tolerating the dressing changes without complication with the Vashe. Fortunately there is no evidence of infection and though there is some Slough on the surface of the wound bed he has excellent granulation noted. Overall I'm pleased with how things have progressed in that regard. A glance at his shoulder as well and the rash seems to be someone improving in my pinion at this site as well. Overall I am pleased with what we're seeing and so is the family. 02/20/18 on evaluation today patient appears to be doing a little worse in regard to the sacral wound only in the fact that there is redness surrounding it has me somewhat concerned for infection. The drainage has also apparently been a little bit off color compared to normal according to family they did keep the dressing today that was removed to show me and I agree this seems to be a little bit different compared to what we have been seeing. Coupled with the redness I'm concerned he may be developing some cellulitis surrounding the wound bed. 02/27/18 on evaluation today patient presents for follow-up concerning his sacral ulcer. We have received the results back from his wound culture which shows unfortunately that the doxycycline will not be of benefit for him I am going to need to initiate treatment with something else  in order to treat the pseudomonas. Otherwise he does not seem to be having any  significant pain although his daughter states there are sometimes when he states having pain. We continue to use the Vashe currently. 03/06/18 on evaluation today patient's sacral wound appears to be doing better in my opinion. He has been tolerating the dressing changes without complication. With that being said the silver nitrate has helped with the prominent area of hyper granulation at the 6 o'clock location we will likely need to repeat this again today. Nonetheless overall I am pleased with how things have improved over the last week. The erythema surrounding the wound seems to be greatly improved. 03/13/18 on evaluation today patient's wound actually does not appear to be terribly infected although she does continue to have erythema surrounding the wound bed especially on the left border. I'm still somewhat concerned about the fact that the oral antibiotics alone may not be completely treating his infection. I previously discussed may need to go for IV antibiotic therapy I'm concerned that may be the case. We will need to make a referral today for infectious disease. 03/20/18 on evaluation today the patient sacral wound actually appears to be doing fairly well in regard to granulation although he continues unfortunately to have it your theme is surrounding the periwound region. There's also some increased swelling at the 6 o'clock location which also has me somewhat concerned. With that being said he does have some discomfort but nothing too significant at this point. He still has not heard from infectious disease his daughter and wife are both present during the office visit today they're going to check back with this again. We did get the information for them to call them today. 03/27/18 on evaluation today patient is seen concerning his ongoing sacral ulcer. He has been tolerating the dressing changes without complication. With that being said he does present with evidence of bright green  drainage noted on the dressing which again is something that I do often expect to see with a pseudomonas infection. He continues to have your theme is [name], [name] ([id number]) surrounding the wound bed as well and again I'm not 100% convinced this is just pressure related. I did speak with Colletta Maryland who is the nurse practitioner in [place] with infectious disease. I spoke with her actually yesterday concerning this patient. She is not 100% convinced that this is infected. She question whether the wound may just be colonized with Pseudomonas and not actually causing an active infection. I am really not thinking that the edema is associated with pressure alone and again overall I don't feel that your theme and is consistent with a pressure injury either as he's never had any contusions noted like a deep tissue injury on his heel which was new and I did visualize today this was on the left heel. Nonetheless she wanted to give this a little bit more time and thought it would be appropriate to start the Wound VAC at this point. 04/03/18 on evaluation today patient sacral ulcer actually appears to be doing fairly well at this point. He has been tolerating the dressing changes without complication. With that being said I'm very pleased with the progress that has been made in regard to his sacral wound over the past week I do not see as much in the way of erythema which is great news. Nonetheless he does have a small area of hyper granulation unfortunately. This is at roughly the 7 o'clock location and I  think does need to be addressed so that this will heal more appropriately. Nonetheless I think we may be ready to go ahead and initiate therapy with the Wound VAC. 04/10/18 on evaluation today patient appears to be doing excellent in regard to his sacral ulcer. The show signs of great improvement in overall I'm very pleased with how things look. He has been tolerating the dressing changes  without complication. Specifically this is the Wound VAC. He also seems to be doing well with the antibiotic there is decreased your theme and redness surrounding the sacral area at this point in the wound has filled in quite significantly. 04/17/18 on evaluation today patient actually appears to be doing excellent in regard to his sacral ulcer. He's been tolerating the dressing changes without complication specifically the Wound VAC. There really are no major concerns from the patient nor family this point he is having no pain. He does have a little bit of Epiboly on the lateral portions of the wound where he does have a little bit more depth that will need to be addressed today. 04/23/18 on evaluation today patient's wound actually appears to be doing excellent at this point. He has been tolerating the Wound VAC and this appears to be doing well other than the fact that it seems to be breaking seal at the 6 o'clock location. I do believe that adding a duodenum dressing at this location try and help maintain the seal would be appropriate and likely very effective. With that being said he overall seems to be showing signs of good improvement at this point. There does not appear to be any evidence of significant infection which is also excellent news. 04/30/18 on evaluation today patient actually appears to be doing well in regard to his sacral ulcer. He's been tolerating the dressing changes without complication. Fortunately there does not appear to be any evidence of infection. Overall I'm very pleased with the progress that has been made up to this point. He does have some blistering underneath the draping unfortunately although this is definitely something that has been noted on other patients previously is a fairly common occurrence. Nonetheless the patient seems to be doing fairly well in general in my pinion based on what I see at this time. I do believe these are fairly superficial and  minor. 05/07/18 on evaluation today patient's wound actually appears to be doing excellent at this point in time. He has been tolerating the Wound VAC decently well he states that it is somewhat cumbersome to carry around unfortunately. The only other issue he's been having according to family is that they been having a difficult time keeping the Wound VAC in place and doing what is supposed to do without making. Obviously I do think that this is definitely of concern. Nonetheless I do believe she's made good progress up to this point. I'm very happy in that regard. 05/14/18 on evaluation today patient's wound continues to make good progress at this point. He had a minimal amount of slough noted on the surface which was easily wiped away with saline and gauze and in general I feel like that he is continuing to show excellent progress even with the discontinuation of the Wound VAC. Overall I'm pleased in this regard. He was having issues with the Wound VAC in getting it to seal I think that using the Prisma at this time has been equally efficient and getting the wound to diminish in size. 05/29/18- He is here in follow up  evaluation for a sacral ulcer. There is improvement, we will continue with prisma and he will follow up in two weeks 06/11/18 on evaluation today patient had unfortunately bright green drainage on the dressing upon evaluation today. This is something that we have encountered before although we were able to get things under control previously with antibiotics. With that being said currently upon further inspection of the three areas of hyper granulation that were separate from the actual wound itself which was almost healed it really appears that these all have some depth to them. They are more tunnels that really have not closed or at least have reopened as a result likely of infection in my pinion. This is definitely not what I was expecting or hoping for. [name], [name]  ([id number]) 06/26/18 on evaluation today patient continues to experience issues with what appears to be small abscesses in the sacral region unfortunately. With that being said he has been tolerating the dressing changes without complication which is good news. He's not having any significant discomfort which is also good news. With that being said his daughter states that after he left last week that the packing that we have placed fell out quite rapidly and he subsequently healed over very quick to the point they were not able to even repack the regions. Nonetheless there appear to be several fluctuance areas noted at this point there's one central region that does seem to be draining still discharge that is somewhat green in color. I did review the results of the wound culture which did show evidence of infection with both MRSA as well as pseudomonas based on that result. Nonetheless again with his other current medications we are not able to do the Cipro due to issues with potential long QT syndrome. I am going to give him a prescription for doxycycline in order to help with the potential MRSA infection. 07/09/18 on evaluation today patient actually appears to be doing about the same in regard to his sacral wound. He actually has his MRI scheduled for tomorrow and then subsequently is going to be having his infectious disease appointment for Thursday of this week. Fortunately he's not having any significant discomfort he still has your theme in the sacral region he still has several blister/flux went areas although they technically are not blisters this is more like a underlying abscess. The one area that is open still does probe down to bone. Again I am concerned about a deeper infection possibly even sacral osteomyelitis. 07/23/18 on evaluation today patient actually appears to be doing very well all things considered in regard to his sacral ulcer. Since I've last seen him he actually did have  his MRI performed which showed that he has a complex fluid collection superficial to the distal sacrum measuring 5.9 x 4.4 x 2.8 cm which abuts the posterior aspect of the distal sacrum and the sacrum itself shows cortical destruction consistent with osteomyelitis. She has also been seen by infectious disease and currently orders have been initiated for IV antibiotic therapy for the next eight weeks. He was seen by Janene Madeira NP and placed on Ceftazidime and Daptomycin. Currently he has not really been on this quite long enough to see a sufficient response to the new orders as far as antibiotic therapy is concerned. Nonetheless it does appear that he is likely on the right track at this point which is great news. Nonetheless the question which both Colletta Maryland and myself have discussed both with the patient and between  ourselves is whether or not the patient may need to be seen by surgeon for surgical evacuation of the fluid collection/abscess and possible debridement of the sacrum itself. Nonetheless at this time my personal opinion is probably gonna be that we wait and get this at least a couple weeks to see the response that he receives with the IV antibiotic therapy. 08/06/18 on evaluation today patient actually appears to be doing rather well in regard to his sacral ulcer region all things considered. He has been tolerating the dressing changes without complication. With that being said there is not any obvious opening nor any drainage noted at this point in time upon evaluation. The patient has been tolerating the dressing changes though. He's doing well with the IV antibiotic therapy. 08/20/18 on evaluation today patient appears to be doing wonderful in regard to his sacral region. In fact there appears to be no wound opening or drainage at this time. Overall I'm very pleased with how things have progressed. The patient likewise as well as his wife and daughter are very happy as  well. Readmission: 11/20/18 on evaluation today patient presents for reevaluation our clinic concerning issues with his sacral region. Unfortunately the area in question is the same region which we previously treated what we thought would successfully back in November 2019. At that time the patient underwent IV antibiotic therapy which seemed to do the job very well. Subsequently however in the past couple of weeks he has begun to have drainage and bleeding from the sacral region and is having increased pain yet again. Fortunately there is no signs of systemic infection although it does appear that the complex abscess that was previously noted may not have fully cleared there was a question between myself as well is infectious disease previous whether not he needed to see a surgeon being that things got better the family opted not to see a surgeon at that time. Nonetheless I am concerned that he may the need to see a surgeon in order to have this area surgically debrided and possibly a bone culture obtained in order to get a better idea of what we're treating and ensure that this is able to completely and fully heal. 11/27/18 on evaluation today patient appears to be doing about the same in regard to his sacral ulcer. He did see Dr. Lysle Pearl yesterday and the plan is to proceed forward with surgery to clean out the region. This seems to be the most appropriate goal of treatment at this point. Dr. Lysle Pearl just needed to speak with Dr. Ellene Route the patient's neurologist in order to get clearance as the patient was supposed to be scheduled for a carpal tunnel and elbow surgery with Dr. Ellene Route. Nonetheless he has apparently given clearance to proceed with this surgery for the sacral region which is deemed more important at this point. Unfortunately the patient had a little bit of increased pain although he is having redness at this point there does not appear to be any spreading infection which is good news. No  fevers, chills, nausea, or vomiting noted at this time. [name], [name] ([id number]) 12/03/18 on evaluation today patient actually appears to be doing about the same in regard to the sacral ulcer. He still waiting to hear back from Dr. Ines Bloomer office in regard to scheduling his surgery. Fortunately there's no signs of systemic infection at this time which is good news. Unfortunately he really does not seem to making any progress the doxycycline does seem to at least  be beneficial in helping to hold off the infection from worsening although unfortunately he still has a lot going on in this regard. Patient's daughter states she's actually gonna contact her office tomorrow to see what exactly is going on. 12/10/18 on evaluation today patient actually appears to be doing about the same in regard to her sacral room. Apparently he still waiting on approval from both cardiology as well as Dr. Ellene Route although apparently he's Artie gotten a formal letter from Dr. Ellene Route stated that he was clear to proceed with the sacral surgery. Di Kindle actually found the clearance letter from cardiology in epic as well today and this subsequently was given to the patient's daughter as that seems to be the only thing that was holding up proceeding with the surgery. Hopefully should be able to take that over to the surgeon's office today in order to go ahead and get this scheduled. 12/24/18 on evaluation today patient actually appears to be doing very well in regard to his sacral ulcer compared to when I last saw him. He has had surgery Dr. Lysle Pearl and it does appear that he was able to clear out this area of abscess very well without complication. This did extend down to the bone and a portion of the bone was sent for pathology and culture. Although on the pathology report it appears this is more tissue and not bone noted. With that being said the culture from the bone and Henrene Pastor asked him this was obtained revealed  Corynebacterium striatum with no anaerobes isolated. Dr. Lysle Pearl did not place the patient on the antibiotics at this point. Again I have been debating on whether or not this is the patient to infectious disease I think that I am going to do that at this point to gain their opinion on whether or not there's anything we should be treated in this regard and if so will be the best treatment options. 12/31/18 on evaluation today patient actually appears to be doing okay in regard to his sacral wound. Fortunately there does not appear to be any evidence of infection at this time which is good news. He has been tolerating the dressing's without complication. With that being said he did see infectious disease and apparently they realize that the pathology samples sent from the operating room was actually. I'll see him and not bone and the question is whether the culture and sample or just contamination from the skin of not guilty and active infection. The daughter is very concerned about this she states that we may need to get a piece of bone to send for examination to ensure there's nothing more severe going on here. Obviously with this history of healing and then obsesses forming and then having to go for surgery now and reopening everything they have a reason to be concerned I completely agree. 01/07/19 on evaluation today patient actually appears to be doing well in regard to his sacral wound. Again we did get results back of his bone culture as well as the pathology which showed no evidence of osteomyelitis at this point this is excellent news. Overall I think that he likely does not need to go forward with the MRI since everything seems to be showing negative at this time. Especially in light of the fact that the wound appears to be doing well and that the infection and everything was running the wound bed seems to be dramatically improved. Patient's daughter is in agreement with this plan. 01/14/19 on  evaluation today  patient appears to be doing rather well at this time as far as the overall appearance of the sacral wound is concerned. The depth is slightly improved he has some granulation tissue noted there still is bone in the very bottom of the wound bed although I'm no longer able to palpate this with my finger I can only tell by probing with the sterile cotton swab. Nonetheless I do believe this is shown signs of good improvement in healing as far as what I'm seeing at this point. 01/22/19 on evaluation today patient's wound in the sacral region appears to be doing rather well. Fortunately there does not seem to be any evidence of active infection at this time which is good news but patient overall has been doing excellent currently. I'm very pleased with how things have progressed over the past week. 01/28/19 on evaluation today patient actually appears to be doing very well in regard to his sacral wound. The area where the bone was exposed is closing in on becoming much more solid which is excellent news there does not appear to be any signs of active infection at this time also good news. Overall very pleased with how everything seems to be progressing. 02/04/19 on evaluation today patient appears to be doing much better in regard to the sacral wound. He has been tolerating the dressing changes without complication and though he is making some progress there's actually some hyper granulation noted at this point. I do think he may benefit from a dressing change we been using the Prisma with the alginate pack behind for some time I think we may want to switch to [facility] Dressing to see if this could be of benefit. 02/11/19 on evaluation today patient appears to be doing well in regard to his sacral wound. There does not appear to be any signs of active infection which is good news. There is some hyper granular tissue which we are attempting to address little by little with the [facility] Dressing although I do believe that many to try silver nitrate today as well. [name], [name] ([id number]) 02/18/19 on evaluation today patient appears to be doing well in regard to his sacral ulcer. He does still have the small hole which goes down deeper but I do need to work on cleaning out today. Nonetheless other than this he seems to be healing quite nicely and overall very pleased in this regard. I'm just having difficulty getting this region in particular to fill in as quickly and well as I would like. 02/25/19 on evaluation today patient's wound bed actually showed signs of good improvement at this point. Fortunately there's no evidence of active infection currently which is great news. Overall been very pleased with how things seem to be progressing. With that being said patient is having very minimal discomfort and again no signs of obvious and active infection at this time. Electronic Signature(s) Signed: 02/25/2019 5:03:42 PM By: Worthy Keeler PA-C Entered By: Worthy Keeler on 02/25/2019 16:52:44 [name] ([id number]) -------------------------------------------------------------------------------- Physical Exam Details Patient Name: JAYDIS, DUCHENE Date of Service: 02/25/2019 3:30 PM Medical Record Number: [id number] Patient Account Number: [id number] Date of Birth/Sex: 15-Nov-1930 (85 y.o. M) Treating RN: Harold Barban Primary Care Provider: Burman Freestone Other Clinician: Referring Provider: Burman Freestone Treating Provider/Extender: STONE III, Tadd Holtmeyer Weeks in Treatment: 19 Constitutional Well-nourished and well-hydrated in no acute distress. Respiratory normal breathing without difficulty. clear to auscultation bilaterally. Cardiovascular regular rate and rhythm with normal S1, S2.  Psychiatric this patient is able to make decisions and demonstrates good insight into disease process. Alert and Oriented x 3. pleasant and cooperative. Notes Patient's  wound bed currently did not require significant sharp debridement today which is excellent news. Fortunately there's no evidence of active infection at this time which is also good news. Overall very pleased with how things seem to be progressing. Electronic Signature(s) Signed: 02/25/2019 5:03:42 PM By: Worthy Keeler PA-C Entered By: Worthy Keeler on 02/25/2019 16:54:12 Kelsey, Edman [name] ([id number]) -------------------------------------------------------------------------------- Physician Orders Details Patient Name: ODELL, CHOUNG Date of Service: 02/25/2019 3:30 PM Medical Record Number: [id number] Patient Account Number: [id number] Date of Birth/Sex: 1931-06-02 (85 y.o. M) Treating RN: Harold Barban Primary Care Provider: Burman Freestone Other Clinician: Referring Provider: Burman Freestone Treating Provider/Extender: [name], Graceyn Fodor Weeks in Treatment: 66 Verbal / Phone Orders: No Diagnosis Coding ICD-10 Coding Code Description L89.154 Pressure ulcer of sacral region, stage 4 L02.31 Cutaneous abscess of buttock Wound Cleansing Wound #2 Midline Sacrum o Clean wound with Normal Saline. o May Shower, gently pat wound dry prior to applying new dressing. Anesthetic (add to Medication List) Wound #2 Midline Sacrum o Topical Lidocaine 4% cream applied to wound bed prior to debridement (In Clinic Only). Primary Wound Dressing Wound #2 Midline Sacrum o Hydrafera Blue Ready Transfer Secondary Dressing Wound #2 Midline Sacrum o Boardered Foam Dressing Dressing Change Frequency Wound #2 Midline Sacrum o Change dressing every other day. Follow-up Appointments Wound #2 Midline Sacrum o Return Appointment in 1 week. Off-Loading Wound #2 Midline Sacrum o Turn and reposition every 2 hours Home Health Wound #2 Midline Gandy for [place] Nurse may visit PRN to address patientos  wound care needs. o FACE TO FACE ENCOUNTER: MEDICARE and MEDICAID PATIENTS: I certify that this patient is under my care and that I had a face-to-face encounter that meets the physician face-to-face encounter requirements with this POOKELA, SELLIN ([id number]) patient on this date. The encounter with the patient was in whole or in part for the following MEDICAL CONDITION: (primary reason for Saline) MEDICAL NECESSITY: I certify, that based on my findings, NURSING services are a medically necessary home health service. HOME BOUND STATUS: I certify that my clinical findings support that this patient is homebound (i.e., Due to illness or injury, pt requires aid of supportive devices such as crutches, cane, wheelchairs, walkers, the use of special transportation or the assistance of another person to leave their place of residence. There is a normal inability to leave the home and doing so requires considerable and taxing effort. Other absences are for medical reasons / religious services and are infrequent or of short duration when for other reasons). o If current dressing causes regression in wound condition, may D/C ordered dressing product/s and apply Normal Saline Moist Dressing daily until next [place] / Other MD appointment. [place] of regression in wound condition at [phone]. o Please direct any NON-WOUND related issues/requests for orders to patient's Primary Care Physician Electronic Signature(s) Signed: 02/25/2019 4:53:18 PM By: Harold Barban Signed: 02/25/2019 5:03:42 PM By: Worthy Keeler PA-C Previous Signature: 02/25/2019 3:58:09 PM Version By: Harold Barban Entered By: Harold Barban on 02/25/2019 16:12:14 [name] ([id number]) -------------------------------------------------------------------------------- Problem List Details Patient Name: JEANETTE, MOFFATT. Date of Service: 02/25/2019 3:30 PM Medical Record  Number: [id number] Patient Account Number: [id number] Date of Birth/Sex: [birth year] (85 y.o. M) Treating RN: Oretha Ellis,  Carmell Austria Primary Care Provider: Burman Freestone Other Clinician: Referring Provider: Burman Freestone Treating Provider/Extender: [name], Kynzlie Hilleary Weeks in Treatment: 13 Active Problems ICD-10 Evaluated Encounter Code Description Active Date Today Diagnosis L89.154 Pressure ulcer of sacral region, stage 4 11/20/2018 No Yes L02.31 Cutaneous abscess of buttock 11/20/2018 No Yes Inactive Problems Resolved Problems Electronic Signature(s) Signed: 02/25/2019 5:03:42 PM By: Worthy Keeler PA-C Entered By: Worthy Keeler on 02/25/2019 15:34:22 [name] ([id number]) -------------------------------------------------------------------------------- Progress Note Details Patient Name: [name] Date of Service: 02/25/2019 3:30 PM Medical Record Number: [id number] Patient Account Number: [id number] Date of Birth/Sex: 1930/10/23 (85 y.o. M) Treating RN: Harold Barban Primary Care Provider: Burman Freestone Other Clinician: Referring Provider: Burman Freestone Treating Provider/Extender: [name], Cartier Washko Weeks in Treatment: 13 Subjective Chief Complaint Information obtained from Patient Sacral pressure ulcer History of Present Illness (HPI) 02/06/18 on evaluation today patient presents for initial evaluation and our clinic concerning an issue which began roughly 3 weeks ago when the patient fell in his home on the floor in his kitchen and laid him down this detergent for roughly 3 days. He had a pressure injury to the left shoulder. This unfortunately has caused him a lot of discomfort although it finally seems to be doing better if anything is really having a lot of itching right now. This appears potentially be a contact dermatitis issue. He also has a significant pressure injury to the sacrum at this time as well which is also showing fascia exposure right over  the bone but no evidence of bone exposure at this point which is good news. They have been using Santyl as well as Saline soaked gauze at this point in time. There does appear to be a lot of necrotic slough in the base of the wound. He does have a history of incontinence, myocardial infarction, and hypertension. He also is "borderline diabetic" hemoglobin A1c of 6.0. Currently he has some discomfort in the pressure site at the sacrum but fortunately nothing too significant this did require sharp debridement today. 02/13/18 on evaluation today patient appears to be doing much better in regard to his sacral wound. He has been tolerating the dressing changes without complication with the Vashe. Fortunately there is no evidence of infection and though there is some Slough on the surface of the wound bed he has excellent granulation noted. Overall I'm pleased with how things have progressed in that regard. A glance at his shoulder as well and the rash seems to be someone improving in my pinion at this site as well. Overall I am pleased with what we're seeing and so is the family. 02/20/18 on evaluation today patient appears to be doing a little worse in regard to the sacral wound only in the fact that there is redness surrounding it has me somewhat concerned for infection. The drainage has also apparently been a little bit off color compared to normal according to family they did keep the dressing today that was removed to show me and I agree this seems to be a little bit different compared to what we have been seeing. Coupled with the redness I'm concerned he may be developing some cellulitis surrounding the wound bed. 02/27/18 on evaluation today patient presents for follow-up concerning his sacral ulcer. We have received the results back from his wound culture which shows unfortunately that the doxycycline will not be of benefit for him I am going to need to initiate treatment with something else in  order to treat the  pseudomonas. Otherwise he does not seem to be having any significant pain although his daughter states there are sometimes when he states having pain. We continue to use the Vashe currently. 03/06/18 on evaluation today patient's sacral wound appears to be doing better in my opinion. He has been tolerating the dressing changes without complication. With that being said the silver nitrate has helped with the prominent area of hyper granulation at the 6 o'clock location we will likely need to repeat this again today. Nonetheless overall I am pleased with how things have improved over the last week. The erythema surrounding the wound seems to be greatly improved. 03/13/18 on evaluation today patient's wound actually does not appear to be terribly infected although she does continue to have erythema surrounding the wound bed especially on the left border. I'm still somewhat concerned about the fact that the oral antibiotics alone may not be completely treating his infection. I previously discussed may need to go for IV antibiotic therapy I'm concerned that may be the case. We will need to make a referral today for infectious disease. 03/20/18 on evaluation today the patient sacral wound actually appears to be doing fairly well in regard to granulation although he continues unfortunately to have it your theme is surrounding the periwound region. There's also some increased swelling at the 6 o'clock location which also has me somewhat concerned. With that being said he does have some discomfort but BRYN, PERKIN. ([id number]) nothing too significant at this point. He still has not heard from infectious disease his daughter and wife are both present during the office visit today they're going to check back with this again. We did get the information for them to call them today. 03/27/18 on evaluation today patient is seen concerning his ongoing sacral ulcer. He has been tolerating the  dressing changes without complication. With that being said he does present with evidence of bright green drainage noted on the dressing which again is something that I do often expect to see with a pseudomonas infection. He continues to have your theme is surrounding the wound bed as well and again I'm not 100% convinced this is just pressure related. I did speak with Colletta Maryland who is the nurse practitioner in [place] with infectious disease. I spoke with her actually yesterday concerning this patient. She is not 100% convinced that this is infected. She question whether the wound may just be colonized with Pseudomonas and not actually causing an active infection. I am really not thinking that the edema is associated with pressure alone and again overall I don't feel that your theme and is consistent with a pressure injury either as he's never had any contusions noted like a deep tissue injury on his heel which was new and I did visualize today this was on the left heel. Nonetheless she wanted to give this a little bit more time and thought it would be appropriate to start the Wound VAC at this point. 04/03/18 on evaluation today patient sacral ulcer actually appears to be doing fairly well at this point. He has been tolerating the dressing changes without complication. With that being said I'm very pleased with the progress that has been made in regard to his sacral wound over the past week I do not see as much in the way of erythema which is great news. Nonetheless he does have a small area of hyper granulation unfortunately. This is at roughly the 7 o'clock location and I think does need to be addressed  so that this will heal more appropriately. Nonetheless I think we may be ready to go ahead and initiate therapy with the Wound VAC. 04/10/18 on evaluation today patient appears to be doing excellent in regard to his sacral ulcer. The show signs of great improvement in overall I'm very pleased  with how things look. He has been tolerating the dressing changes without complication. Specifically this is the Wound VAC. He also seems to be doing well with the antibiotic there is decreased your theme and redness surrounding the sacral area at this point in the wound has filled in quite significantly. 04/17/18 on evaluation today patient actually appears to be doing excellent in regard to his sacral ulcer. He's been tolerating the dressing changes without complication specifically the Wound VAC. There really are no major concerns from the patient nor family this point he is having no pain. He does have a little bit of Epiboly on the lateral portions of the wound where he does have a little bit more depth that will need to be addressed today. 04/23/18 on evaluation today patient's wound actually appears to be doing excellent at this point. He has been tolerating the Wound VAC and this appears to be doing well other than the fact that it seems to be breaking seal at the 6 o'clock location. I do believe that adding a duodenum dressing at this location try and help maintain the seal would be appropriate and likely very effective. With that being said he overall seems to be showing signs of good improvement at this point. There does not appear to be any evidence of significant infection which is also excellent news. 04/30/18 on evaluation today patient actually appears to be doing well in regard to his sacral ulcer. He's been tolerating the dressing changes without complication. Fortunately there does not appear to be any evidence of infection. Overall I'm very pleased with the progress that has been made up to this point. He does have some blistering underneath the draping unfortunately although this is definitely something that has been noted on other patients previously is a fairly common occurrence. Nonetheless the patient seems to be doing fairly well in general in my pinion based on what I see at  this time. I do believe these are fairly superficial and minor. 05/07/18 on evaluation today patient's wound actually appears to be doing excellent at this point in time. He has been tolerating the Wound VAC decently well he states that it is somewhat cumbersome to carry around unfortunately. The only other issue he's been having according to family is that they been having a difficult time keeping the Wound VAC in place and doing what is supposed to do without making. Obviously I do think that this is definitely of concern. Nonetheless I do believe she's made good progress up to this point. I'm very happy in that regard. 05/14/18 on evaluation today patient's wound continues to make good progress at this point. He had a minimal amount of slough noted on the surface which was easily wiped away with saline and gauze and in general I feel like that he is continuing to show excellent progress even with the discontinuation of the Wound VAC. Overall I'm pleased in this regard. He was having issues with the Wound VAC in getting it to seal I think that using the Prisma at this time has been equally efficient and getting the wound to diminish in size. 05/29/18- He is here in follow up evaluation for a sacral ulcer. There  is improvement, we will continue with prisma and he will follow up in two weeks KEIRAN, GAFFEY ([id number]) 06/11/18 on evaluation today patient had unfortunately bright green drainage on the dressing upon evaluation today. This is something that we have encountered before although we were able to get things under control previously with antibiotics. With that being said currently upon further inspection of the three areas of hyper granulation that were separate from the actual wound itself which was almost healed it really appears that these all have some depth to them. They are more tunnels that really have not closed or at least have reopened as a result likely of infection in my pinion.  This is definitely not what I was expecting or hoping for. 06/26/18 on evaluation today patient continues to experience issues with what appears to be small abscesses in the sacral region unfortunately. With that being said he has been tolerating the dressing changes without complication which is good news. He's not having any significant discomfort which is also good news. With that being said his daughter states that after he left last week that the packing that we have placed fell out quite rapidly and he subsequently healed over very quick to the point they were not able to even repack the regions. Nonetheless there appear to be several fluctuance areas noted at this point there's one central region that does seem to be draining still discharge that is somewhat green in color. I did review the results of the wound culture which did show evidence of infection with both MRSA as well as pseudomonas based on that result. Nonetheless again with his other current medications we are not able to do the Cipro due to issues with potential long QT syndrome. I am going to give him a prescription for doxycycline in order to help with the potential MRSA infection. 07/09/18 on evaluation today patient actually appears to be doing about the same in regard to his sacral wound. He actually has his MRI scheduled for tomorrow and then subsequently is going to be having his infectious disease appointment for Thursday of this week. Fortunately he's not having any significant discomfort he still has your theme in the sacral region he still has several blister/flux went areas although they technically are not blisters this is more like a underlying abscess. The one area that is open still does probe down to bone. Again I am concerned about a deeper infection possibly even sacral osteomyelitis. 07/23/18 on evaluation today patient actually appears to be doing very well all things considered in regard to his sacral  ulcer. Since I've last seen him he actually did have his MRI performed which showed that he has a complex fluid collection superficial to the distal sacrum measuring 5.9 x 4.4 x 2.8 cm which abuts the posterior aspect of the distal sacrum and the sacrum itself shows cortical destruction consistent with osteomyelitis. She has also been seen by infectious disease and currently orders have been initiated for IV antibiotic therapy for the next eight weeks. He was seen by Janene Madeira NP and placed on Ceftazidime and Daptomycin. Currently he has not really been on this quite long enough to see a sufficient response to the new orders as far as antibiotic therapy is concerned. Nonetheless it does appear that he is likely on the right track at this point which is great news. Nonetheless the question which both Colletta Maryland and myself have discussed both with the patient and between ourselves is whether or not the  patient may need to be seen by surgeon for surgical evacuation of the fluid collection/abscess and possible debridement of the sacrum itself. Nonetheless at this time my personal opinion is probably gonna be that we wait and get this at least a couple weeks to see the response that he receives with the IV antibiotic therapy. 08/06/18 on evaluation today patient actually appears to be doing rather well in regard to his sacral ulcer region all things considered. He has been tolerating the dressing changes without complication. With that being said there is not any obvious opening nor any drainage noted at this point in time upon evaluation. The patient has been tolerating the dressing changes though. He's doing well with the IV antibiotic therapy. 08/20/18 on evaluation today patient appears to be doing wonderful in regard to his sacral region. In fact there appears to be no wound opening or drainage at this time. Overall I'm very pleased with how things have progressed. The patient likewise as well  as his wife and daughter are very happy as well. Readmission: 11/20/18 on evaluation today patient presents for reevaluation our clinic concerning issues with his sacral region. Unfortunately the area in question is the same region which we previously treated what we thought would successfully back in November 2019. At that time the patient underwent IV antibiotic therapy which seemed to do the job very well. Subsequently however in the past couple of weeks he has begun to have drainage and bleeding from the sacral region and is having increased pain yet again. Fortunately there is no signs of systemic infection although it does appear that the complex abscess that was previously noted may not have fully cleared there was a question between myself as well is infectious disease previous whether not he needed to see a surgeon being that things got better the family opted not to see a surgeon at that time. Nonetheless I am concerned that he may the need to see a surgeon in order to have this area surgically debrided and possibly a bone culture obtained in order to get a better idea of what we're treating and ensure that this is able to completely and fully heal. 11/27/18 on evaluation today patient appears to be doing about the same in regard to his sacral ulcer. He did see Dr. Viviano Simas, [name] ([id number]) yesterday and the plan is to proceed forward with surgery to clean out the region. This seems to be the most appropriate goal of treatment at this point. Dr. Lysle Pearl just needed to speak with Dr. Ellene Route the patient's neurologist in order to get clearance as the patient was supposed to be scheduled for a carpal tunnel and elbow surgery with Dr. Ellene Route. Nonetheless he has apparently given clearance to proceed with this surgery for the sacral region which is deemed more important at this point. Unfortunately the patient had a little bit of increased pain although he is having redness at this point  there does not appear to be any spreading infection which is good news. No fevers, chills, nausea, or vomiting noted at this time. 12/03/18 on evaluation today patient actually appears to be doing about the same in regard to the sacral ulcer. He still waiting to hear back from Dr. Ines Bloomer office in regard to scheduling his surgery. Fortunately there's no signs of systemic infection at this time which is good news. Unfortunately he really does not seem to making any progress the doxycycline does seem to at least be beneficial in helping to hold  off the infection from worsening although unfortunately he still has a lot going on in this regard. Patient's daughter states she's actually gonna contact her office tomorrow to see what exactly is going on. 12/10/18 on evaluation today patient actually appears to be doing about the same in regard to her sacral room. Apparently he still waiting on approval from both cardiology as well as Dr. Ellene Route although apparently he's Artie gotten a formal letter from Dr. Ellene Route stated that he was clear to proceed with the sacral surgery. Di Kindle actually found the clearance letter from cardiology in epic as well today and this subsequently was given to the patient's daughter as that seems to be the only thing that was holding up proceeding with the surgery. Hopefully should be able to take that over to the surgeon's office today in order to go ahead and get this scheduled. 12/24/18 on evaluation today patient actually appears to be doing very well in regard to his sacral ulcer compared to when I last saw him. He has had surgery Dr. Lysle Pearl and it does appear that he was able to clear out this area of abscess very well without complication. This did extend down to the bone and a portion of the bone was sent for pathology and culture. Although on the pathology report it appears this is more tissue and not bone noted. With that being said the culture from the bone and Henrene Pastor asked  him this was obtained revealed Corynebacterium striatum with no anaerobes isolated. Dr. Lysle Pearl did not place the patient on the antibiotics at this point. Again I have been debating on whether or not this is the patient to infectious disease I think that I am going to do that at this point to gain their opinion on whether or not there's anything we should be treated in this regard and if so will be the best treatment options. 12/31/18 on evaluation today patient actually appears to be doing okay in regard to his sacral wound. Fortunately there does not appear to be any evidence of infection at this time which is good news. He has been tolerating the dressing's without complication. With that being said he did see infectious disease and apparently they realize that the pathology samples sent from the operating room was actually. I'll see him and not bone and the question is whether the culture and sample or just contamination from the skin of not guilty and active infection. The daughter is very concerned about this she states that we may need to get a piece of bone to send for examination to ensure there's nothing more severe going on here. Obviously with this history of healing and then obsesses forming and then having to go for surgery now and reopening everything they have a reason to be concerned I completely agree. 01/07/19 on evaluation today patient actually appears to be doing well in regard to his sacral wound. Again we did get results back of his bone culture as well as the pathology which showed no evidence of osteomyelitis at this point this is excellent news. Overall I think that he likely does not need to go forward with the MRI since everything seems to be showing negative at this time. Especially in light of the fact that the wound appears to be doing well and that the infection and everything was running the wound bed seems to be dramatically improved. Patient's daughter is in agreement  with this plan. 01/14/19 on evaluation today patient appears to be doing rather  well at this time as far as the overall appearance of the sacral wound is concerned. The depth is slightly improved he has some granulation tissue noted there still is bone in the very bottom of the wound bed although I'm no longer able to palpate this with my finger I can only tell by probing with the sterile cotton swab. Nonetheless I do believe this is shown signs of good improvement in healing as far as what I'm seeing at this point. 01/22/19 on evaluation today patient's wound in the sacral region appears to be doing rather well. Fortunately there does not seem to be any evidence of active infection at this time which is good news but patient overall has been doing excellent currently. I'm very pleased with how things have progressed over the past week. 01/28/19 on evaluation today patient actually appears to be doing very well in regard to his sacral wound. The area where the bone was exposed is closing in on becoming much more solid which is excellent news there does not appear to be any signs of active infection at this time also good news. Overall very pleased with how everything seems to be progressing. 02/04/19 on evaluation today patient appears to be doing much better in regard to the sacral wound. He has been tolerating the dressing changes without complication and though he is making some progress there's actually some hyper granulation noted at this point. I do think he may benefit from a dressing change we been using the Prisma with the alginate pack behind [name], [name]. ([id number]) for some time I think we may want to switch to [facility] Dressing to see if this could be of benefit. 02/11/19 on evaluation today patient appears to be doing well in regard to his sacral wound. There does not appear to be any signs of active infection which is good news. There is some hyper granular tissue which we are  attempting to address little by little with the [facility] Dressing although I do believe that many to try silver nitrate today as well. 02/18/19 on evaluation today patient appears to be doing well in regard to his sacral ulcer. He does still have the small hole which goes down deeper but I do need to work on cleaning out today. Nonetheless other than this he seems to be healing quite nicely and overall very pleased in this regard. I'm just having difficulty getting this region in particular to fill in as quickly and well as I would like. 02/25/19 on evaluation today patient's wound bed actually showed signs of good improvement at this point. Fortunately there's no evidence of active infection currently which is great news. Overall been very pleased with how things seem to be progressing. With that being said patient is having very minimal discomfort and again no signs of obvious and active infection at this time. Patient History Information obtained from Patient. Family History Cancer - Paternal Grandparents, Diabetes - Father, Heart Disease - Mother,Father, Stroke - Father, No family history of Hypertension, Kidney Disease, Lung Disease, Seizures, Thyroid Problems, Tuberculosis. Social History Never smoker, Marital Status - Widowed, Alcohol Use - Never, Drug Use - No History, Caffeine Use - Daily. Medical History Eyes Patient has history of Cataracts - bilateral removal Denies history of Glaucoma, Optic Neuritis Ear/Nose/Mouth/Throat Denies history of Chronic sinus problems/congestion, Middle ear problems Hematologic/Lymphatic Denies history of Anemia, Hemophilia, Human Immunodeficiency Virus, Lymphedema, Sickle Cell Disease Respiratory Patient has history of Asthma Denies history of Aspiration, Chronic Obstructive Pulmonary  Disease (COPD), Pneumothorax, Sleep Apnea, Tuberculosis Cardiovascular Patient has history of Angina, Arrhythmia, Coronary Artery Disease, Hypertension,  Myocardial Infarction - 2001 Denies history of Congestive Heart Failure, Deep Vein Thrombosis, Hypotension, Peripheral Arterial Disease, Peripheral Venous Disease, Phlebitis, Vasculitis Gastrointestinal Denies history of Cirrhosis , Colitis, Crohn s, Hepatitis A, Hepatitis B, Hepatitis C Endocrine Denies history of Type I Diabetes, Type II Diabetes Genitourinary Denies history of End Stage Renal Disease Immunological Denies history of Lupus Erythematosus, Raynaud s, Scleroderma Integumentary (Skin) Denies history of History of Burn, History of pressure wounds Musculoskeletal Patient has history of Osteoarthritis Denies history of Gout, Rheumatoid Arthritis, Osteomyelitis Neurologic Patient has history of Dementia, Neuropathy Denies history of Quadriplegia, Paraplegia, Seizure Disorder Oncologic Denies history of Received Chemotherapy, Received Radiation [name], [name] ([id number]) Psychiatric Denies history of Anorexia/bulimia, Confinement Anxiety Hospitalization/Surgery History - Fall. Medical And Surgical History Notes Endocrine Borderline Oncologic Melanoma on back Review of Systems (ROS) Constitutional Symptoms (General Health) Denies complaints or symptoms of Fatigue, Fever, Chills, Marked Weight Change. Respiratory Denies complaints or symptoms of Chronic or frequent coughs, Shortness of Breath. Cardiovascular Denies complaints or symptoms of Chest pain, LE edema. Psychiatric Denies complaints or symptoms of Anxiety, Claustrophobia. Objective Constitutional Well-nourished and well-hydrated in no acute distress. Vitals Time Taken: 3:28 PM, Height: 69 in, Weight: 170.6 lbs, BMI: 25.2, Temperature: 98.0 F, Pulse: 66 bpm, Respiratory Rate: 16 breaths/min, Blood Pressure: 160/72 mmHg. Respiratory normal breathing without difficulty. clear to auscultation bilaterally. Cardiovascular regular rate and rhythm with normal S1, S2. Psychiatric this patient is able to  make decisions and demonstrates good insight into disease process. Alert and Oriented x 3. pleasant and cooperative. General Notes: Patient's wound bed currently did not require significant sharp debridement today which is excellent news. Fortunately there's no evidence of active infection at this time which is also good news. Overall very pleased with how things seem to be progressing. Integumentary (Hair, Skin) Wound #2 status is Open. Original cause of wound was Gradually Appeared. The wound is located on the Midline Sacrum. The wound measures 0.8cm length x 0.4cm width x 0.8cm depth; 0.251cm^2 area and 0.201cm^3 volume. There is Fat Layer (Subcutaneous Tissue) Exposed exposed. There is no tunneling noted. There is a medium amount of purulent drainage noted. The wound margin is indistinct and nonvisible. There is medium (34-66%) pale, hyper - granulation within the wound bed. There is a small (1-33%) amount of necrotic tissue within the wound bed including Adherent Slough. The periwound skin [name], [name] ([id number]) appearance exhibited: Scarring, Erythema. The periwound skin appearance did not exhibit: Callus, Crepitus, Excoriation, Induration, Rash, Dry/Scaly, Maceration, Atrophie Blanche, Cyanosis, Ecchymosis, Hemosiderin Staining, Mottled, Pallor, Rubor. The surrounding wound skin color is noted with erythema which is circumferential. Periwound temperature was noted as No Abnormality. The periwound has tenderness on palpation. Assessment Active Problems ICD-10 Pressure ulcer of sacral region, stage 4 Cutaneous abscess of buttock Plan Wound Cleansing: Wound #2 Midline Sacrum: Clean wound with Normal Saline. May Shower, gently pat wound dry prior to applying new dressing. Anesthetic (add to Medication List): Wound #2 Midline Sacrum: Topical Lidocaine 4% cream applied to wound bed prior to debridement (In Clinic Only). Primary Wound Dressing: Wound #2 Midline Sacrum: Hydrafera  Blue Ready Transfer Secondary Dressing: Wound #2 Midline Sacrum: Boardered Foam Dressing Dressing Change Frequency: Wound #2 Midline Sacrum: Change dressing every other day. Follow-up Appointments: Wound #2 Midline Sacrum: Return Appointment in 1 week. Off-Loading: Wound #2 Midline Sacrum: Turn and reposition every 2 hours Home Health: Wound #2  Midline Sacrum: [place] for [facility] Nurse may visit PRN to address patient s wound care needs. FACE TO FACE ENCOUNTER: MEDICARE and MEDICAID PATIENTS: I certify that this patient is under my care and that I had a face-to-face encounter that meets the physician face-to-face encounter requirements with this patient on this date. The encounter with the patient was in whole or in part for the following MEDICAL CONDITION: (primary reason for [place]) MEDICAL NECESSITY: I certify, that based on my findings, NURSING services are a medically necessary home health service. HOME BOUND STATUS: I certify that my clinical findings support that this patient is homebound (i.e., Due to illness or injury, pt requires aid of supportive devices such as crutches, cane, wheelchairs, walkers, the use of special transportation or the assistance of another person to leave their place of residence. There is a normal inability to leave the home and doing so requires considerable and taxing effort. Other absences are for medical reasons / religious services and ADELFO, DIEBEL ([id number]) are infrequent or of short duration when for other reasons). If current dressing causes regression in wound condition, may D/C ordered dressing product/s and apply Normal Saline Moist Dressing daily until next [place] / Other MD appointment. [place] of regression in wound condition at [phone]. Please direct any NON-WOUND related issues/requests for orders to patient's Primary Care  Physician I'm gonna suggest that we continue with the above wound care measures for the next week and the patient is in agreement with that plan. If anything changes or worsens in the meantime he will contact the office and let me know. Otherwise hopefully will be able to get this last small area to fill in and close shortly. Please see above for specific wound care orders. We will see patient for re-evaluation in 1 week(s) here in the clinic. If anything worsens or changes patient will contact our office for additional recommendations. Electronic Signature(s) Signed: 02/25/2019 5:03:42 PM By: Worthy Keeler PA-C Entered By: Worthy Keeler on 02/25/2019 16:54:36 [place], [name] ([id number]) -------------------------------------------------------------------------------- ROS/PFSH Details Patient Name: [name] Date of Service: 02/25/2019 3:30 PM Medical Record Number: [id number] Patient Account Number: [id number] Date of Birth/Sex: December 08, 1930 (85 y.o. M) Treating RN: Harold Barban Primary Care Provider: Burman Freestone Other Clinician: Referring Provider: Burman Freestone Treating Provider/Extender: [name], Wiletta Bermingham Weeks in Treatment: 13 Information Obtained From Patient Constitutional Symptoms (General Health) Complaints and Symptoms: Negative for: Fatigue; Fever; Chills; Marked Weight Change Respiratory Complaints and Symptoms: Negative for: Chronic or frequent coughs; Shortness of Breath Medical History: Positive for: Asthma Negative for: Aspiration; Chronic Obstructive Pulmonary Disease (COPD); Pneumothorax; Sleep Apnea; Tuberculosis Cardiovascular Complaints and Symptoms: Negative for: Chest pain; LE edema Medical History: Positive for: Angina; Arrhythmia; Coronary Artery Disease; Hypertension; Myocardial Infarction - 2001 Negative for: Congestive Heart Failure; Deep Vein Thrombosis; Hypotension; Peripheral Arterial Disease; Peripheral Venous Disease; Phlebitis;  Vasculitis Psychiatric Complaints and Symptoms: Negative for: Anxiety; Claustrophobia Medical History: Negative for: Anorexia/bulimia; Confinement Anxiety Eyes Medical History: Positive for: Cataracts - bilateral removal Negative for: Glaucoma; Optic Neuritis Ear/Nose/Mouth/Throat Medical History: Negative for: Chronic sinus problems/congestion; Middle ear problems Hematologic/Lymphatic Medical History: Negative for: Anemia; Hemophilia; Human Immunodeficiency Virus; Lymphedema; Sickle Cell Disease KAISEI, GILBO ([id number]) Gastrointestinal Medical History: Negative for: Cirrhosis ; Colitis; Crohnos; Hepatitis A; Hepatitis B; Hepatitis C Endocrine Medical History: Negative for: Type I Diabetes; Type II Diabetes Past Medical History Notes: Borderline Genitourinary Medical History: Negative for:  End Stage Renal Disease Immunological Medical History: Negative for: Lupus Erythematosus; Raynaudos; Scleroderma Integumentary (Skin) Medical History: Negative for: History of Burn; History of pressure wounds Musculoskeletal Medical History: Positive for: Osteoarthritis Negative for: Gout; Rheumatoid Arthritis; Osteomyelitis Neurologic Medical History: Positive for: Dementia; Neuropathy Negative for: Quadriplegia; Paraplegia; Seizure Disorder Oncologic Medical History: Negative for: Received Chemotherapy; Received Radiation Past Medical History Notes: Melanoma on back HBO Extended History Items Eyes: Cataracts Immunizations Pneumococcal Vaccine: Received Pneumococcal Vaccination: Yes Implantable Devices No devices added Hospitalization / Surgery History Type of Hospitalization/Surgery [name], [name] ([id number]) Fall Family and Social History Cancer: Yes - Paternal Grandparents; Diabetes: Yes - Father; Heart Disease: Yes - Mother,Father; Hypertension: No; Kidney Disease: No; Lung Disease: No; Seizures: No; Stroke: Yes - Father; Thyroid Problems: No;  Tuberculosis: No; Never smoker; Marital Status - Widowed; Alcohol Use: Never; Drug Use: No History; Caffeine Use: Daily; Financial Concerns: No; Food, Clothing or Shelter Needs: No; Support System Lacking: No; Transportation Concerns: No Physician Affirmation I have reviewed and agree with the above information. Electronic Signature(s) Signed: 02/25/2019 5:03:42 PM By: Worthy Keeler PA-C Signed: 02/26/2019 3:55:39 PM By: Harold Barban Entered By: Worthy Keeler on 02/25/2019 16:53:48 [name] ([id number]) -------------------------------------------------------------------------------- SuperBill Details Patient Name: [name] Date of Service: 02/25/2019 Medical Record Number: [id number] Patient Account Number: [id number] Date of Birth/Sex: Dec 15, 1930 (85 y.o. M) Treating RN: Harold Barban Primary Care Provider: Burman Freestone Other Clinician: Referring Provider: Burman Freestone Treating Provider/Extender: [name], Junior Kenedy Weeks in Treatment: 13 Diagnosis Coding ICD-10 Codes Code Description L89.154 Pressure ulcer of sacral region, stage 4 L02.31 Cutaneous abscess of buttock Facility Procedures CPT4 Code: [id number] Description: [phone] - WOUND CARE VISIT-LEV 2 EST PT Modifier: Quantity: 1 Physician Procedures CPT4 Code: [id number] Description: 83419 - WC PHYS LEVEL 4 - EST PT ICD-10 Diagnosis Description L89.154 Pressure ulcer of sacral region, stage 4 L02.31 Cutaneous abscess of buttock Modifier: Quantity: 1 Electronic Signature(s) Signed: 02/25/2019 5:03:42 PM By: Worthy Keeler PA-C Entered By: Worthy Keeler on 02/25/2019 16:54:52

## 2019-03-04 ENCOUNTER — Encounter: Payer: Medicare Other | Admitting: Physician Assistant

## 2019-03-04 ENCOUNTER — Other Ambulatory Visit: Payer: Self-pay

## 2019-03-04 DIAGNOSIS — L89154 Pressure ulcer of sacral region, stage 4: Secondary | ICD-10-CM | POA: Diagnosis not present

## 2019-03-05 NOTE — Progress Notes (Signed)
FERNANDEZ, KENLEY ([id number]) Visit Report for 03/04/2019 Chief Complaint Document Details Patient Name: [name], [name]. Date of Service: 03/04/2019 1:45 PM Medical Record Number: [id number] Patient Account Number: [id number] Date of Birth/Sex: [birth year] (85 y.o. M) Treating RN: Harold Barban Primary Care Provider: Burman Freestone Other Clinician: Referring Provider: Burman Freestone Treating Provider/Extender: [name], HOYT Weeks in Treatment: 14 Information Obtained from: Patient Chief Complaint Sacral pressure ulcer Electronic Signature(s) Signed: 03/05/2019 1:42:22 PM By: Worthy Keeler PA-C Entered By: Worthy Keeler on 03/04/2019 15:03:17 [name] ([id number]) -------------------------------------------------------------------------------- HPI Details Patient Name: [name] Date of Service: 03/04/2019 1:45 PM Medical Record Number: [id number] Patient Account Number: [id number] Date of Birth/Sex: [birth year] (85 y.o. M) Treating RN: Harold Barban Primary Care Provider: Burman Freestone Other Clinician: Referring Provider: Burman Freestone Treating Provider/Extender: [name], HOYT Weeks in Treatment: 14 History of Present Illness HPI Description: 02/06/18 on evaluation today patient presents for initial evaluation and our clinic concerning an issue which began roughly 3 weeks ago when the patient fell in his home on the floor in his kitchen and laid him down this detergent for roughly 3 days. He had a pressure injury to the left shoulder. This unfortunately has caused him a lot of discomfort although it finally seems to be doing better if anything is really having a lot of itching right now. This appears potentially be a contact dermatitis issue. He also has a significant pressure injury to the sacrum at this time as well which is also showing fascia exposure right over the bone but no evidence of bone exposure at this point which is good news. They have  been using Santyl as well as Saline soaked gauze at this point in time. There does appear to be a lot of necrotic slough in the base of the wound. He does have a history of incontinence, myocardial infarction, and hypertension. He also is "borderline diabetic" hemoglobin A1c of 6.0. Currently he has some discomfort in the pressure site at the sacrum but fortunately nothing too significant this did require sharp debridement today. 02/13/18 on evaluation today patient appears to be doing much better in regard to his sacral wound. He has been tolerating the dressing changes without complication with the Vashe. Fortunately there is no evidence of infection and though there is some Slough on the surface of the wound bed he has excellent granulation noted. Overall I'm pleased with how things have progressed in that regard. A glance at his shoulder as well and the rash seems to be someone improving in my pinion at this site as well. Overall I am pleased with what we're seeing and so is the family. 02/20/18 on evaluation today patient appears to be doing a little worse in regard to the sacral wound only in the fact that there is redness surrounding it has me somewhat concerned for infection. The drainage has also apparently been a little bit off color compared to normal according to family they did keep the dressing today that was removed to show me and I agree this seems to be a little bit different compared to what we have been seeing. Coupled with the redness I'm concerned he may be developing some cellulitis surrounding the wound bed. 02/27/18 on evaluation today patient presents for follow-up concerning his sacral ulcer. We have received the results back from his wound culture which shows unfortunately that the doxycycline will not be of benefit for him I am going to need to initiate treatment with something else  in order to treat the pseudomonas. Otherwise he does not seem to be having any  significant pain although his daughter states there are sometimes when he states having pain. We continue to use the Vashe currently. 03/06/18 on evaluation today patient's sacral wound appears to be doing better in my opinion. He has been tolerating the dressing changes without complication. With that being said the silver nitrate has helped with the prominent area of hyper granulation at the 6 o'clock location we will likely need to repeat this again today. Nonetheless overall I am pleased with how things have improved over the last week. The erythema surrounding the wound seems to be greatly improved. 03/13/18 on evaluation today patient's wound actually does not appear to be terribly infected although she does continue to have erythema surrounding the wound bed especially on the left border. I'm still somewhat concerned about the fact that the oral antibiotics alone may not be completely treating his infection. I previously discussed may need to go for IV antibiotic therapy I'm concerned that may be the case. We will need to make a referral today for infectious disease. 03/20/18 on evaluation today the patient sacral wound actually appears to be doing fairly well in regard to granulation although he continues unfortunately to have it your theme is surrounding the periwound region. There's also some increased swelling at the 6 o'clock location which also has me somewhat concerned. With that being said he does have some discomfort but nothing too significant at this point. He still has not heard from infectious disease his daughter and wife are both present during the office visit today they're going to check back with this again. We did get the information for them to call them today. 03/27/18 on evaluation today patient is seen concerning his ongoing sacral ulcer. He has been tolerating the dressing changes without complication. With that being said he does present with evidence of bright green  drainage noted on the dressing which again is something that I do often expect to see with a pseudomonas infection. He continues to have your theme is surrounding the wound bed as well and again I'm not 100% convinced this is just pressure related. I did speak with Colletta Maryland who is the nurse practitioner in [place] with infectious disease. I spoke with her actually yesterday concerning this patient. She is not 100% convinced that this is infected. She question whether the wound may just be colonized with Pseudomonas and not actually causing an active infection. I am really not thinking that the edema is associated with pressure alone and again overall I don't feel that your theme and is consistent with a pressure injury either as he's never had any contusions noted like a deep tissue injury on his heel which was new and I did visualize today this was on the left heel. Nonetheless she wanted to give this a little bit more time and thought it would be appropriate to start the Wound VAC at this point. CRISTAL, HOWATT ([id number]) 04/03/18 on evaluation today patient sacral ulcer actually appears to be doing fairly well at this point. He has been tolerating the dressing changes without complication. With that being said I'm very pleased with the progress that has been made in regard to his sacral wound over the past week I do not see as much in the way of erythema which is great news. Nonetheless he does have a small area of hyper granulation unfortunately. This is at roughly the 7 o'clock location and I  think does need to be addressed so that this will heal more appropriately. Nonetheless I think we may be ready to go ahead and initiate therapy with the Wound VAC. 04/10/18 on evaluation today patient appears to be doing excellent in regard to his sacral ulcer. The show signs of great improvement in overall I'm very pleased with how things look. He has been tolerating the dressing changes  without complication. Specifically this is the Wound VAC. He also seems to be doing well with the antibiotic there is decreased your theme and redness surrounding the sacral area at this point in the wound has filled in quite significantly. 04/17/18 on evaluation today patient actually appears to be doing excellent in regard to his sacral ulcer. He's been tolerating the dressing changes without complication specifically the Wound VAC. There really are no major concerns from the patient nor family this point he is having no pain. He does have a little bit of Epiboly on the lateral portions of the wound where he does have a little bit more depth that will need to be addressed today. 04/23/18 on evaluation today patient's wound actually appears to be doing excellent at this point. He has been tolerating the Wound VAC and this appears to be doing well other than the fact that it seems to be breaking seal at the 6 o'clock location. I do believe that adding a duodenum dressing at this location try and help maintain the seal would be appropriate and likely very effective. With that being said he overall seems to be showing signs of good improvement at this point. There does not appear to be any evidence of significant infection which is also excellent news. 04/30/18 on evaluation today patient actually appears to be doing well in regard to his sacral ulcer. He's been tolerating the dressing changes without complication. Fortunately there does not appear to be any evidence of infection. Overall I'm very pleased with the progress that has been made up to this point. He does have some blistering underneath the draping unfortunately although this is definitely something that has been noted on other patients previously is a fairly common occurrence. Nonetheless the patient seems to be doing fairly well in general in my pinion based on what I see at this time. I do believe these are fairly superficial and  minor. 05/07/18 on evaluation today patient's wound actually appears to be doing excellent at this point in time. He has been tolerating the Wound VAC decently well he states that it is somewhat cumbersome to carry around unfortunately. The only other issue he's been having according to family is that they been having a difficult time keeping the Wound VAC in place and doing what is supposed to do without making. Obviously I do think that this is definitely of concern. Nonetheless I do believe she's made good progress up to this point. I'm very happy in that regard. 05/14/18 on evaluation today patient's wound continues to make good progress at this point. He had a minimal amount of slough noted on the surface which was easily wiped away with saline and gauze and in general I feel like that he is continuing to show excellent progress even with the discontinuation of the Wound VAC. Overall I'm pleased in this regard. He was having issues with the Wound VAC in getting it to seal I think that using the Prisma at this time has been equally efficient and getting the wound to diminish in size. 05/29/18- He is here in follow up  evaluation for a sacral ulcer. There is improvement, we will continue with prisma and he will follow up in two weeks 06/11/18 on evaluation today patient had unfortunately bright green drainage on the dressing upon evaluation today. This is something that we have encountered before although we were able to get things under control previously with antibiotics. With that being said currently upon further inspection of the three areas of hyper granulation that were separate from the actual wound itself which was almost healed it really appears that these all have some depth to them. They are more tunnels that really have not closed or at least have reopened as a result likely of infection in my pinion. This is definitely not what I was expecting or hoping for. 06/26/18 on evaluation today  patient continues to experience issues with what appears to be small abscesses in the sacral region unfortunately. With that being said he has been tolerating the dressing changes without complication which is good news. He's not having any significant discomfort which is also good news. With that being said his daughter states that after he left last week that the packing that we have placed fell out quite rapidly and he subsequently healed over very quick to the point they were not able to even repack the regions. Nonetheless there appear to be several fluctuance areas noted at this point there's one central region that does seem to be draining still discharge that is somewhat green in color. I did review the results of the wound culture which did show evidence of infection with both MRSA as well as pseudomonas based on that result. Nonetheless again with his other current medications we are not able to do the Cipro due to issues with potential long QT syndrome. I am going to give him a prescription for doxycycline in order to help with the potential MRSA infection. 07/09/18 on evaluation today patient actually appears to be doing about the same in regard to his sacral wound. He actually has his MRI scheduled for tomorrow and then subsequently is going to be having his infectious disease appointment for Thursday of this week. Fortunately he's not having any significant discomfort he still has your theme in the sacral region he still has several blister/flux went areas although they technically are not blisters this is more like a underlying abscess. The one area that is open still does probe down to bone. Again I am concerned about a deeper infection possibly even sacral SELSO, MANNOR. ([id number]) osteomyelitis. 07/23/18 on evaluation today patient actually appears to be doing very well all things considered in regard to his sacral ulcer. Since I've last seen him he actually did have his MRI  performed which showed that he has a complex fluid collection superficial to the distal sacrum measuring 5.9 x 4.4 x 2.8 cm which abuts the posterior aspect of the distal sacrum and the sacrum itself shows cortical destruction consistent with osteomyelitis. She has also been seen by infectious disease and currently orders have been initiated for IV antibiotic therapy for the next eight weeks. He was seen by Janene Madeira NP and placed on Ceftazidime and Daptomycin. Currently he has not really been on this quite long enough to see a sufficient response to the new orders as far as antibiotic therapy is concerned. Nonetheless it does appear that he is likely on the right track at this point which is great news. Nonetheless the question which both Colletta Maryland and myself have discussed both with the patient and between  ourselves is whether or not the patient may need to be seen by surgeon for surgical evacuation of the fluid collection/abscess and possible debridement of the sacrum itself. Nonetheless at this time my personal opinion is probably gonna be that we wait and get this at least a couple weeks to see the response that he receives with the IV antibiotic therapy. 08/06/18 on evaluation today patient actually appears to be doing rather well in regard to his sacral ulcer region all things considered. He has been tolerating the dressing changes without complication. With that being said there is not any obvious opening nor any drainage noted at this point in time upon evaluation. The patient has been tolerating the dressing changes though. He's doing well with the IV antibiotic therapy. 08/20/18 on evaluation today patient appears to be doing wonderful in regard to his sacral region. In fact there appears to be no wound opening or drainage at this time. Overall I'm very pleased with how things have progressed. The patient likewise as well as his wife and daughter are very happy as  well. Readmission: 11/20/18 on evaluation today patient presents for reevaluation our clinic concerning issues with his sacral region. Unfortunately the area in question is the same region which we previously treated what we thought would successfully back in November 2019. At that time the patient underwent IV antibiotic therapy which seemed to do the job very well. Subsequently however in the past couple of weeks he has begun to have drainage and bleeding from the sacral region and is having increased pain yet again. Fortunately there is no signs of systemic infection although it does appear that the complex abscess that was previously noted may not have fully cleared there was a question between myself as well is infectious disease previous whether not he needed to see a surgeon being that things got better the family opted not to see a surgeon at that time. Nonetheless I am concerned that he may the need to see a surgeon in order to have this area surgically debrided and possibly a bone culture obtained in order to get a better idea of what we're treating and ensure that this is able to completely and fully heal. 11/27/18 on evaluation today patient appears to be doing about the same in regard to his sacral ulcer. He did see Dr. Lysle Pearl yesterday and the plan is to proceed forward with surgery to clean out the region. This seems to be the most appropriate goal of treatment at this point. Dr. Lysle Pearl just needed to speak with Dr. Ellene Route the patient's neurologist in order to get clearance as the patient was supposed to be scheduled for a carpal tunnel and elbow surgery with Dr. Ellene Route. Nonetheless he has apparently given clearance to proceed with this surgery for the sacral region which is deemed more important at this point. Unfortunately the patient had a little bit of increased pain although he is having redness at this point there does not appear to be any spreading infection which is good news. No  fevers, chills, nausea, or vomiting noted at this time. 12/03/18 on evaluation today patient actually appears to be doing about the same in regard to the sacral ulcer. He still waiting to hear back from Dr. Ines Bloomer office in regard to scheduling his surgery. Fortunately there's no signs of systemic infection at this time which is good news. Unfortunately he really does not seem to making any progress the doxycycline does seem to at least be beneficial in helping  to hold off the infection from worsening although unfortunately he still has a lot going on in this regard. Patient's daughter states she's actually gonna contact her office tomorrow to see what exactly is going on. 12/10/18 on evaluation today patient actually appears to be doing about the same in regard to her sacral room. Apparently he still waiting on approval from both cardiology as well as Dr. Ellene Route although apparently he's Artie gotten a formal letter from Dr. Ellene Route stated that he was clear to proceed with the sacral surgery. Di Kindle actually found the clearance letter from cardiology in epic as well today and this subsequently was given to the patient's daughter as that seems to be the only thing that was holding up proceeding with the surgery. Hopefully should be able to take that over to the surgeon's office today in order to go ahead and get this scheduled. 12/24/18 on evaluation today patient actually appears to be doing very well in regard to his sacral ulcer compared to when I last saw him. He has had surgery Dr. Lysle Pearl and it does appear that he was able to clear out this area of abscess very well without complication. This did extend down to the bone and a portion of the bone was sent for pathology and culture. Although on the pathology report it appears this is more tissue and not bone noted. With that being said the culture from the bone and Henrene Pastor asked him this was obtained revealed Corynebacterium striatum with no anaerobes  isolated. Dr. Lysle Pearl did not place the patient on the antibiotics at this point. Again I have been debating on whether or not this is the patient to infectious disease I think that I am going to do that at this point to gain their opinion on whether or not there's anything we should be treated in this regard and if so will be the best treatment options. 12/31/18 on evaluation today patient actually appears to be doing okay in regard to his sacral wound. Fortunately there does BLAYDEN, CONWELL. ([id number]) not appear to be any evidence of infection at this time which is good news. He has been tolerating the dressing's without complication. With that being said he did see infectious disease and apparently they realize that the pathology samples sent from the operating room was actually. I'll see him and not bone and the question is whether the culture and sample or just contamination from the skin of not guilty and active infection. The daughter is very concerned about this she states that we may need to get a piece of bone to send for examination to ensure there's nothing more severe going on here. Obviously with this history of healing and then obsesses forming and then having to go for surgery now and reopening everything they have a reason to be concerned I completely agree. 01/07/19 on evaluation today patient actually appears to be doing well in regard to his sacral wound. Again we did get results back of his bone culture as well as the pathology which showed no evidence of osteomyelitis at this point this is excellent news. Overall I think that he likely does not need to go forward with the MRI since everything seems to be showing negative at this time. Especially in light of the fact that the wound appears to be doing well and that the infection and everything was running the wound bed seems to be dramatically improved. Patient's daughter is in agreement with this plan. 01/14/19 on evaluation  today  patient appears to be doing rather well at this time as far as the overall appearance of the sacral wound is concerned. The depth is slightly improved he has some granulation tissue noted there still is bone in the very bottom of the wound bed although I'm no longer able to palpate this with my finger I can only tell by probing with the sterile cotton swab. Nonetheless I do believe this is shown signs of good improvement in healing as far as what I'm seeing at this point. 01/22/19 on evaluation today patient's wound in the sacral region appears to be doing rather well. Fortunately there does not seem to be any evidence of active infection at this time which is good news but patient overall has been doing excellent currently. I'm very pleased with how things have progressed over the past week. 01/28/19 on evaluation today patient actually appears to be doing very well in regard to his sacral wound. The area where the bone was exposed is closing in on becoming much more solid which is excellent news there does not appear to be any signs of active infection at this time also good news. Overall very pleased with how everything seems to be progressing. 02/04/19 on evaluation today patient appears to be doing much better in regard to the sacral wound. He has been tolerating the dressing changes without complication and though he is making some progress there's actually some hyper granulation noted at this point. I do think he may benefit from a dressing change we been using the Prisma with the alginate pack behind for some time I think we may want to switch to [facility] Dressing to see if this could be of benefit. 02/11/19 on evaluation today patient appears to be doing well in regard to his sacral wound. There does not appear to be any signs of active infection which is good news. There is some hyper granular tissue which we are attempting to address little by little with the [facility] Dressing  although I do believe that many to try silver nitrate today as well. 02/18/19 on evaluation today patient appears to be doing well in regard to his sacral ulcer. He does still have the small hole which goes down deeper but I do need to work on cleaning out today. Nonetheless other than this he seems to be healing quite nicely and overall very pleased in this regard. I'm just having difficulty getting this region in particular to fill in as quickly and well as I would like. 02/25/19 on evaluation today patient's wound bed actually showed signs of good improvement at this point. Fortunately there's no evidence of active infection currently which is great news. Overall been very pleased with how things seem to be progressing. With that being said patient is having very minimal discomfort and again no signs of obvious and active infection at this time. 03/04/19 on evaluation today patient's wound in the sacral region actually appears to be doing quite well in my pinion. He has just a very small opening remaining at this point. Fortunately there's no signs of infection and there is really no significant Slough buildup. I believe he is very close to complete closure. Electronic Signature(s) Signed: 03/05/2019 1:42:22 PM By: Worthy Keeler PA-C Entered By: Worthy Keeler on 03/05/2019 11:32:39 [name] ([id number]) -------------------------------------------------------------------------------- Physical Exam Details Patient Name: SHINICHI, ANGUIANO Date of Service: 03/04/2019 1:45 PM Medical Record Number: [id number] Patient Account Number: [id number] Date of Birth/Sex: May 17, 1931 (85 y.o. M)  Treating RN: Harold Barban Primary Care Provider: Burman Freestone Other Clinician: Referring Provider: Burman Freestone Treating Provider/Extender: STONE III, HOYT Weeks in Treatment: 60 Constitutional Well-nourished and well-hydrated in no acute distress. Respiratory normal breathing without  difficulty. clear to auscultation bilaterally. Cardiovascular regular rate and rhythm with normal S1, S2. Psychiatric this patient is able to make decisions and demonstrates good insight into disease process. Alert and Oriented x 3. pleasant and cooperative. Notes Patient's wound bed currently showed some minimal Slough noted on the surface of the wound which actually was able to clean off with saline and gauze along with a sterile Q-tip. He tolerated all this without any complication and after cleaning he seems to be doing quite well. He did not have any pain either which is also good news. My suggestion at this point is gonna be that we go ahead and continue with the above wound care measures for the next week. He is in agreement with this plan. Subsequently we will see were things stand at follow-up. Electronic Signature(s) Signed: 03/05/2019 1:42:22 PM By: Worthy Keeler PA-C Entered By: Worthy Keeler on 03/05/2019 11:33:21 [name] ([id number]) -------------------------------------------------------------------------------- Physician Orders Details Patient Name: DAVYD, PODGORSKI Date of Service: 03/04/2019 1:45 PM Medical Record Number: [id number] Patient Account Number: [id number] Date of Birth/Sex: 11-06-1930 (85 y.o. M) Treating RN: Harold Barban Primary Care Provider: Burman Freestone Other Clinician: Referring Provider: Burman Freestone Treating Provider/Extender: [name], HOYT Weeks in Treatment: 14 Verbal / Phone Orders: No Diagnosis Coding ICD-10 Coding Code Description L89.154 Pressure ulcer of sacral region, stage 4 L02.31 Cutaneous abscess of buttock Wound Cleansing Wound #2 Midline Sacrum o Clean wound with Normal Saline. o May Shower, gently pat wound dry prior to applying new dressing. Anesthetic (add to Medication List) Wound #2 Midline Sacrum o Topical Lidocaine 4% cream applied to wound bed prior to debridement (In Clinic Only). Primary  Wound Dressing Wound #2 Midline Sacrum o Other: - Endoform Secondary Dressing Wound #2 Midline Sacrum o Boardered Foam Dressing Dressing Change Frequency Wound #2 Midline Sacrum o Change dressing every other day. Follow-up Appointments Wound #2 Midline Sacrum o Return Appointment in 2 weeks. Off-Loading Wound #2 Midline Sacrum o Turn and reposition every 2 hours Home Health Wound #2 Midline [place] for [place] Nurse may visit PRN to address patientos wound care needs. o FACE TO FACE ENCOUNTER: MEDICARE and MEDICAID PATIENTS: I certify that this patient is under my care and that I had a face-to-face encounter that meets the physician face-to-face encounter requirements with this patient on this date. The encounter with the patient was in whole or in part for the following MEDICAL CONDITION: (primary reason for [place]) MEDICAL NECESSITY: I certify, that based on my findings, NURSING services are a medically necessary home health service. HOME BOUND STATUS: I certify that my clinical findings support that this patient is homebound (i.e., Due to illness or injury, pt requires aid of supportive devices such as crutches, cane, wheelchairs, walkers, the use of special transportation or the assistance of another person to leave their place of residence. There is a normal inability to leave the home and doing so requires considerable and taxing effort. Other absences are for medical reasons / religious services and are infrequent or of short duration when for other reasons). THEOTIS, GERDEMAN ([id number]) o If current dressing causes regression in wound condition, may D/C ordered dressing product/s and apply Normal Saline  Moist Dressing daily until next [place] / Other MD appointment. [place] of regression in wound condition at [phone]. o Please direct  any NON-WOUND related issues/requests for orders to patient's Primary Care Physician Electronic Signature(s) Signed: 03/04/2019 4:38:46 PM By: Harold Barban Signed: 03/05/2019 1:42:22 PM By: Worthy Keeler PA-C Entered By: Harold Barban on 03/04/2019 15:10:30 [name] ([id number]) -------------------------------------------------------------------------------- Problem List Details Patient Name: ANTHONNY, SCHILLER. Date of Service: 03/04/2019 1:45 PM Medical Record Number: [id number] Patient Account Number: [id number] Date of Birth/Sex: November 16, 1930 (85 y.o. 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 M) Treating RN: Harold Barban Primary Care Provider: Burman Freestone Other Clinician: Referring Provider: Burman Freestone Treating Provider/Extender: [name], HOYT Weeks in Treatment: 14 Subjective Chief Complaint Information obtained from Patient Sacral pressure ulcer History of Present Illness (HPI) 02/06/18 on evaluation today patient presents for initial evaluation and our clinic concerning an issue which began roughly 3 weeks ago when the patient fell  in his home on the floor in his kitchen and laid him down this detergent for roughly 3 days. He had a pressure injury to the left shoulder. This unfortunately has caused him a lot of discomfort although it finally seems to be doing better if anything is really having a lot of itching right now. This appears potentially be a contact dermatitis issue. He also has a significant pressure injury to the sacrum at this time as well which is also showing fascia exposure right over the bone but no evidence of bone exposure at this point which is good news. They have been using Santyl as well as Saline soaked gauze at this point in time. There does appear to be a lot of necrotic slough in the base of the wound. He does have a history of incontinence, myocardial infarction, and hypertension. He also is "borderline diabetic" hemoglobin A1c of 6.0. Currently he has some discomfort in the pressure site at the sacrum but fortunately nothing too significant this did require sharp debridement today. 02/13/18 on evaluation today patient appears to be doing much better in regard to his sacral wound. He has been tolerating the dressing changes without complication with the Vashe. Fortunately there is no evidence of infection and though there is some Slough on the surface of the wound bed he has excellent granulation noted. Overall I'm pleased with how things have progressed in that regard. A glance at his shoulder as well and the rash seems to be someone improving in my pinion at this site as well. Overall I am pleased with what we're seeing and so is the family. 02/20/18 on evaluation today patient appears to be doing a little worse in regard to the sacral wound only in the fact that there is redness surrounding it has me somewhat concerned for infection. The drainage has also apparently been a little bit off color compared to normal according  to family they did keep the dressing today that was removed to show me and I  agree this seems to be a little bit different compared to what we have been seeing. Coupled with the redness I'm concerned he may be developing some cellulitis surrounding the wound bed. 02/27/18 on evaluation today patient presents for follow-up concerning his sacral ulcer. We have received the results back from his wound culture which shows unfortunately that the doxycycline will not be of benefit for him I am going to need to initiate treatment with something else in order to treat the pseudomonas. Otherwise he does not seem to be having any significant pain although his daughter states there are sometimes when he states having pain. We continue to use the Vashe currently. 03/06/18 on evaluation today patient's sacral wound appears to be doing better in my opinion. He has been tolerating the dressing changes without complication. With that being said the silver nitrate has helped with the prominent area of hyper granulation at the 6 o'clock location we will likely need to repeat this again today. Nonetheless overall I am pleased with how things have improved over the last week. The erythema surrounding the wound seems to be greatly improved. 03/13/18 on evaluation today patient's wound actually does not appear to be terribly infected although she does continue to have erythema surrounding the wound bed especially on the left border. I'm still somewhat concerned about the fact that the oral antibiotics alone may not be completely treating his infection. I previously discussed may need to go for IV antibiotic therapy I'm concerned that may be the case. We will need to make a referral today for infectious disease. 03/20/18 on evaluation today the patient sacral wound actually appears to be doing fairly well in regard to granulation although he continues unfortunately to have it your theme is surrounding the periwound region. There's also some increased swelling at the 6 o'clock location which also has me  somewhat concerned. With that being said he does have some discomfort but nothing too significant at this point. He still has not heard from infectious disease his daughter and wife are both present during the office visit today they're going to check back with this again. We did get the information for them to call them today. 03/27/18 on evaluation today patient is seen concerning his ongoing sacral ulcer. He has been tolerating the dressing changes without complication. With that being said he does present with evidence of bright green drainage noted on the dressing which again is something that I do often expect to see with a pseudomonas infection. He continues to have your theme is surrounding the wound bed as well and again I'm not 100% convinced this is just pressure related. I did speak with Colletta Maryland who is the nurse practitioner in [place] with infectious disease. I spoke with her actually yesterday concerning this patient. She is not 100% convinced that this is infected. She question whether the wound may just be colonized with EBEN, CHOINSKI. ([id number]) Pseudomonas and not actually causing an active infection. I am really not thinking that the edema is associated with pressure alone and again overall I don't feel that your theme and is consistent with a pressure injury either as he's never had any contusions noted like a deep tissue injury on his heel which was new and I did visualize today this was on the left heel. Nonetheless she wanted to give this a little bit more time and thought it would be appropriate  to start the Wound VAC at this point. 04/03/18 on evaluation today patient sacral ulcer actually appears to be doing fairly well at this point. He has been tolerating the dressing changes without complication. With that being said I'm very pleased with the progress that has been made in regard to his sacral wound over the past week I do not see as much in the way of erythema  which is great news. Nonetheless he does have a small area of hyper granulation unfortunately. This is at roughly the 7 o'clock location and I think does need to be addressed so that this will heal more appropriately. Nonetheless I think we may be ready to go ahead and initiate therapy with the Wound VAC. 04/10/18 on evaluation today patient appears to be doing excellent in regard to his sacral ulcer. The show signs of great improvement in overall I'm very pleased with how things look. He has been tolerating the dressing changes without complication. Specifically this is the Wound VAC. He also seems to be doing well with the antibiotic there is decreased your theme and redness surrounding the sacral area at this point in the wound has filled in quite significantly. 04/17/18 on evaluation today patient actually appears to be doing excellent in regard to his sacral ulcer. He's been tolerating the dressing changes without complication specifically the Wound VAC. There really are no major concerns from the patient nor family this point he is having no pain. He does have a little bit of Epiboly on the lateral portions of the wound where he does have a little bit more depth that will need to be addressed today. 04/23/18 on evaluation today patient's wound actually appears to be doing excellent at this point. He has been tolerating the Wound VAC and this appears to be doing well other than the fact that it seems to be breaking seal at the 6 o'clock location. I do believe that adding a duodenum dressing at this location try and help maintain the seal would be appropriate and likely very effective. With that being said he overall seems to be showing signs of good improvement at this point. There does not appear to be any evidence of significant infection which is also excellent news. 04/30/18 on evaluation today patient actually appears to be doing well in regard to his sacral ulcer. He's been tolerating  the dressing changes without complication. Fortunately there does not appear to be any evidence of infection. Overall I'm very pleased with the progress that has been made up to this point. He does have some blistering underneath the draping unfortunately although this is definitely something that has been noted on other patients previously is a fairly common occurrence. Nonetheless the patient seems to be doing fairly well in general in my pinion based on what I see at this time. I do believe these are fairly superficial and minor. 05/07/18 on evaluation today patient's wound actually appears to be doing excellent at this point in time. He has been tolerating the Wound VAC decently well he states that it is somewhat cumbersome to carry around unfortunately. The only other issue he's been having according to family is that they been having a difficult time keeping the Wound VAC in place and doing what is supposed to do without making. Obviously I do think that this is definitely of concern. Nonetheless I do believe she's made good progress up to this point. I'm very happy in that regard. 05/14/18 on evaluation today patient's wound continues to make  good progress at this point. He had a minimal amount of slough noted on the surface which was easily wiped away with saline and gauze and in general I feel like that he is continuing to show excellent progress even with the discontinuation of the Wound VAC. Overall I'm pleased in this regard. He was having issues with the Wound VAC in getting it to seal I think that using the Prisma at this time has been equally efficient and getting the wound to diminish in size. 05/29/18- He is here in follow up evaluation for a sacral ulcer. There is improvement, we will continue with prisma and he will follow up in two weeks 06/11/18 on evaluation today patient had unfortunately bright green drainage on the dressing upon evaluation today. This is something that we have  encountered before although we were able to get things under control previously with antibiotics. With that being said currently upon further inspection of the three areas of hyper granulation that were separate from the actual wound itself which was almost healed it really appears that these all have some depth to them. They are more tunnels that really have not closed or at least have reopened as a result likely of infection in my pinion. This is definitely not what I was expecting or hoping for. 06/26/18 on evaluation today patient continues to experience issues with what appears to be small abscesses in the sacral region unfortunately. With that being said he has been tolerating the dressing changes without complication which is good news. He's not having any significant discomfort which is also good news. With that being said his daughter states that after he left last week that the packing that we have placed fell out quite rapidly and he subsequently healed over very quick to the point they were not able to even repack the regions. Nonetheless there appear to be several fluctuance areas noted at this point there's one central region that does seem to be draining still discharge that is somewhat green in color. I did review the results of the wound culture which did show evidence of infection with both MRSA as well as pseudomonas based on that result. Nonetheless again with his other current medications we are not able to do the Cipro due to issues with potential long QT syndrome. I am going to give him a prescription for doxycycline in order to help with the potential MRSA infection. DELOREAN, KNUTZEN ([id number]) 07/09/18 on evaluation today patient actually appears to be doing about the same in regard to his sacral wound. He actually has his MRI scheduled for tomorrow and then subsequently is going to be having his infectious disease appointment for Thursday of this week. Fortunately he's not  having any significant discomfort he still has your theme in the sacral region he still has several blister/flux went areas although they technically are not blisters this is more like a underlying abscess. The one area that is open still does probe down to bone. Again I am concerned about a deeper infection possibly even sacral osteomyelitis. 07/23/18 on evaluation today patient actually appears to be doing very well all things considered in regard to his sacral ulcer. Since I've last seen him he actually did have his MRI performed which showed that he has a complex fluid collection superficial to the distal sacrum measuring 5.9 x 4.4 x 2.8 cm which abuts the posterior aspect of the distal sacrum and the sacrum itself shows cortical destruction consistent with osteomyelitis. She has also been  seen by infectious disease and currently orders have been initiated for IV antibiotic therapy for the next eight weeks. He was seen by Janene Madeira NP and placed on Ceftazidime and Daptomycin. Currently he has not really been on this quite long enough to see a sufficient response to the new orders as far as antibiotic therapy is concerned. Nonetheless it does appear that he is likely on the right track at this point which is great news. Nonetheless the question which both Colletta Maryland and myself have discussed both with the patient and between ourselves is whether or not the patient may need to be seen by surgeon for surgical evacuation of the fluid collection/abscess and possible debridement of the sacrum itself. Nonetheless at this time my personal opinion is probably gonna be that we wait and get this at least a couple weeks to see the response that he receives with the IV antibiotic therapy. 08/06/18 on evaluation today patient actually appears to be doing rather well in regard to his sacral ulcer region all things considered. He has been tolerating the dressing changes without complication. With that being  said there is not any obvious opening nor any drainage noted at this point in time upon evaluation. The patient has been tolerating the dressing changes though. He's doing well with the IV antibiotic therapy. 08/20/18 on evaluation today patient appears to be doing wonderful in regard to his sacral region. In fact there appears to be no wound opening or drainage at this time. Overall I'm very pleased with how things have progressed. The patient likewise as well as his wife and daughter are very happy as well. Readmission: 11/20/18 on evaluation today patient presents for reevaluation our clinic concerning issues with his sacral region. Unfortunately the area in question is the same region which we previously treated what we thought would successfully back in November 2019. At that time the patient underwent IV antibiotic therapy which seemed to do the job very well. Subsequently however in the past couple of weeks he has begun to have drainage and bleeding from the sacral region and is having increased pain yet again. Fortunately there is no signs of systemic infection although it does appear that the complex abscess that was previously noted may not have fully cleared there was a question between myself as well is infectious disease previous whether not he needed to see a surgeon being that things got better the family opted not to see a surgeon at that time. Nonetheless I am concerned that he may the need to see a surgeon in order to have this area surgically debrided and possibly a bone culture obtained in order to get a better idea of what we're treating and ensure that this is able to completely and fully heal. 11/27/18 on evaluation today patient appears to be doing about the same in regard to his sacral ulcer. He did see Dr. Lysle Pearl yesterday and the plan is to proceed forward with surgery to clean out the region. This seems to be the most appropriate goal of treatment at this point. Dr. Lysle Pearl  just needed to speak with Dr. Ellene Route the patient's neurologist in order to get clearance as the patient was supposed to be scheduled for a carpal tunnel and elbow surgery with Dr. Ellene Route. Nonetheless he has apparently given clearance to proceed with this surgery for the sacral region which is deemed more important at this point. Unfortunately the patient had a little bit of increased pain although he is having redness at this  point there does not appear to be any spreading infection which is good news. No fevers, chills, nausea, or vomiting noted at this time. 12/03/18 on evaluation today patient actually appears to be doing about the same in regard to the sacral ulcer. He still waiting to hear back from Dr. Ines Bloomer office in regard to scheduling his surgery. Fortunately there's no signs of systemic infection at this time which is good news. Unfortunately he really does not seem to making any progress the doxycycline does seem to at least be beneficial in helping to hold off the infection from worsening although unfortunately he still has a lot going on in this regard. Patient's daughter states she's actually gonna contact her office tomorrow to see what exactly is going on. 12/10/18 on evaluation today patient actually appears to be doing about the same in regard to her sacral room. Apparently he still waiting on approval from both cardiology as well as Dr. Ellene Route although apparently he's Artie gotten a formal letter from Dr. Ellene Route stated that he was clear to proceed with the sacral surgery. Di Kindle actually found the clearance letter from cardiology in epic as well today and this subsequently was given to the patient's daughter as that seems to be the only thing that was holding up proceeding with the surgery. Hopefully should be able to take that over to the surgeon's office today in order to go ahead and get this scheduled. 12/24/18 on evaluation today patient actually appears to be doing very well  in regard to his sacral ulcer compared to when I last saw him. He has had surgery Dr. Lysle Pearl and it does appear that he was able to clear out this area of abscess very well without complication. This did extend down to the bone and a portion of the bone was sent for pathology and culture. Although on the pathology report it appears this is more tissue and not bone noted. With that being said the culture from the bone and Henrene Pastor asked him this was obtained revealed Corynebacterium striatum with no anaerobes isolated. Dr. Lysle Pearl did not place the MATTHER, LABELL ([id number]) patient on the antibiotics at this point. Again I have been debating on whether or not this is the patient to infectious disease I think that I am going to do that at this point to gain their opinion on whether or not there's anything we should be treated in this regard and if so will be the best treatment options. 12/31/18 on evaluation today patient actually appears to be doing okay in regard to his sacral wound. Fortunately there does not appear to be any evidence of infection at this time which is good news. He has been tolerating the dressing's without complication. With that being said he did see infectious disease and apparently they realize that the pathology samples sent from the operating room was actually. I'll see him and not bone and the question is whether the culture and sample or just contamination from the skin of not guilty and active infection. The daughter is very concerned about this she states that we may need to get a piece of bone to send for examination to ensure there's nothing more severe going on here. Obviously with this history of healing and then obsesses forming and then having to go for surgery now and reopening everything they have a reason to be concerned I completely agree. 01/07/19 on evaluation today patient actually appears to be doing well in regard to his sacral wound. Again we  did get  results back of his bone culture as well as the pathology which showed no evidence of osteomyelitis at this point this is excellent news. Overall I think that he likely does not need to go forward with the MRI since everything seems to be showing negative at this time. Especially in light of the fact that the wound appears to be doing well and that the infection and everything was running the wound bed seems to be dramatically improved. Patient's daughter is in agreement with this plan. 01/14/19 on evaluation today patient appears to be doing rather well at this time as far as the overall appearance of the sacral wound is concerned. The depth is slightly improved he has some granulation tissue noted there still is bone in the very bottom of the wound bed although I'm no longer able to palpate this with my finger I can only tell by probing with the sterile cotton swab. Nonetheless I do believe this is shown signs of good improvement in healing as far as what I'm seeing at this point. 01/22/19 on evaluation today patient's wound in the sacral region appears to be doing rather well. Fortunately there does not seem to be any evidence of active infection at this time which is good news but patient overall has been doing excellent currently. I'm very pleased with how things have progressed over the past week. 01/28/19 on evaluation today patient actually appears to be doing very well in regard to his sacral wound. The area where the bone was exposed is closing in on becoming much more solid which is excellent news there does not appear to be any signs of active infection at this time also good news. Overall very pleased with how everything seems to be progressing. 02/04/19 on evaluation today patient appears to be doing much better in regard to the sacral wound. He has been tolerating the dressing changes without complication and though he is making some progress there's actually some hyper granulation noted  at this point. I do think he may benefit from a dressing change we been using the Prisma with the alginate pack behind for some time I think we may want to switch to [facility] Dressing to see if this could be of benefit. 02/11/19 on evaluation today patient appears to be doing well in regard to his sacral wound. There does not appear to be any signs of active infection which is good news. There is some hyper granular tissue which we are attempting to address little by little with the [facility] Dressing although I do believe that many to try silver nitrate today as well. 02/18/19 on evaluation today patient appears to be doing well in regard to his sacral ulcer. He does still have the small hole which goes down deeper but I do need to work on cleaning out today. Nonetheless other than this he seems to be healing quite nicely and overall very pleased in this regard. I'm just having difficulty getting this region in particular to fill in as quickly and well as I would like. 02/25/19 on evaluation today patient's wound bed actually showed signs of good improvement at this point. Fortunately there's no evidence of active infection currently which is great news. Overall been very pleased with how things seem to be progressing. With that being said patient is having very minimal discomfort and again no signs of obvious and active infection at this time. 03/04/19 on evaluation today patient's wound in the sacral region actually appears to  be doing quite well in my pinion. He has just a very small opening remaining at this point. Fortunately there's no signs of infection and there is really no significant Slough buildup. I believe he is very close to complete closure. Patient History Information obtained from Patient. Family History Cancer - Paternal Grandparents, Diabetes - Father, Heart Disease - Mother,Father, Stroke - Father, No family history of Hypertension, Kidney Disease, Lung Disease,  Seizures, Thyroid Problems, Tuberculosis. Social History Never smoker, Marital Status - Widowed, Alcohol Use - Never, Drug Use - No History, Caffeine Use - Daily. Medical History Eyes JARTAVIOUS, MCKIMMY ([id number]) Patient has history of Cataracts - bilateral removal Denies history of Glaucoma, Optic Neuritis Ear/Nose/Mouth/Throat Denies history of Chronic sinus problems/congestion, Middle ear problems Hematologic/Lymphatic Denies history of Anemia, Hemophilia, Human Immunodeficiency Virus, Lymphedema, Sickle Cell Disease Respiratory Patient has history of Asthma Denies history of Aspiration, Chronic Obstructive Pulmonary Disease (COPD), Pneumothorax, Sleep Apnea, Tuberculosis Cardiovascular Patient has history of Angina, Arrhythmia, Coronary Artery Disease, Hypertension, Myocardial Infarction - 2001 Denies history of Congestive Heart Failure, Deep Vein Thrombosis, Hypotension, Peripheral Arterial Disease, Peripheral Venous Disease, Phlebitis, Vasculitis Gastrointestinal Denies history of Cirrhosis , Colitis, Crohn s, Hepatitis A, Hepatitis B, Hepatitis C Endocrine Denies history of Type I Diabetes, Type II Diabetes Genitourinary Denies history of End Stage Renal Disease Immunological Denies history of Lupus Erythematosus, Raynaud s, Scleroderma Integumentary (Skin) Denies history of History of Burn, History of pressure wounds Musculoskeletal Patient has history of Osteoarthritis Denies history of Gout, Rheumatoid Arthritis, Osteomyelitis Neurologic Patient has history of Dementia, Neuropathy Denies history of Quadriplegia, Paraplegia, Seizure Disorder Oncologic Denies history of Received Chemotherapy, Received Radiation Psychiatric Denies history of Anorexia/bulimia, Confinement Anxiety Hospitalization/Surgery History - Fall. Medical And Surgical History Notes Endocrine Borderline Oncologic Melanoma on back Review of Systems (ROS) Constitutional Symptoms (General  Health) Denies complaints or symptoms of Fatigue, Fever, Chills, Marked Weight Change. Respiratory Denies complaints or symptoms of Chronic or frequent coughs, Shortness of Breath. Cardiovascular Denies complaints or symptoms of Chest pain, LE edema. Psychiatric Denies complaints or symptoms of Anxiety, Claustrophobia. Objective Constitutional Well-nourished and well-hydrated in no acute distress. Vitals Time Taken: 1:45 PM, Height: 69 in, Weight: 170.6 lbs, BMI: 25.2, Temperature: 98.2 F, Pulse: 61 bpm, Respiratory Rate: 16 breaths/min, Blood Pressure: 158/72 mmHg. MARQUIZ, SOTELO ([id number]) Respiratory normal breathing without difficulty. clear to auscultation bilaterally. Cardiovascular regular rate and rhythm with normal S1, S2. Psychiatric this patient is able to make decisions and demonstrates good insight into disease process. Alert and Oriented x 3. pleasant and cooperative. General Notes: Patient's wound bed currently showed some minimal Slough noted on the surface of the wound which actually was able to clean off with saline and gauze along with a sterile Q-tip. He tolerated all this without any complication and after cleaning he seems to be doing quite well. He did not have any pain either which is also good news. My suggestion at this point is gonna be that we go ahead and continue with the above wound care measures for the next week. He is in agreement with this plan. Subsequently we will see were things stand at follow-up. Integumentary (Hair, Skin) Wound #2 status is Open. Original cause of wound was Gradually Appeared. The wound is located on the Midline Sacrum. The wound measures 0.4cm length x 0.2cm width x 1cm depth; 0.063cm^2 area and 0.063cm^3 volume. There is Fat Layer (Subcutaneous Tissue) Exposed exposed. There is no tunneling or undermining noted. There is a medium amount of purulent  drainage noted. The wound margin is indistinct and nonvisible. There is  medium (34-66%) pale, hyper - granulation within the wound bed. There is a small (1-33%) amount of necrotic tissue within the wound bed including Adherent Slough. Assessment Active Problems ICD-10 Pressure ulcer of sacral region, stage 4 Cutaneous abscess of buttock Plan Wound Cleansing: Wound #2 Midline Sacrum: Clean wound with Normal Saline. May Shower, gently pat wound dry prior to applying new dressing. Anesthetic (add to Medication List): Wound #2 Midline Sacrum: Topical Lidocaine 4% cream applied to wound bed prior to debridement (In Clinic Only). Primary Wound Dressing: Wound #2 Midline Sacrum: Other: - Endoform Secondary Dressing: Wound #2 Midline Sacrum: Boardered Foam Dressing Dressing Change Frequency: Wound #2 Midline Sacrum: Change dressing every other day. Follow-up Appointments: Wound #2 Midline Sacrum: Return Appointment in 2 weeks. Off-Loading: Wound #2 Midline Sacrum: Turn and reposition every 2 hours Home Health: Wound #2 Midline Sacrum: KEIRAN, GAFFEY ([id number]) [place] [facility] [place] [facility] Home Health Nurse may visit PRN to address patient s wound care needs. FACE TO FACE ENCOUNTER: MEDICARE and MEDICAID PATIENTS: I certify that this patient is under my care and that I had a face-to-face encounter that meets the physician face-to-face encounter requirements with this patient on this date. The encounter with the patient was in whole or in part for the following MEDICAL CONDITION: (primary reason for [place]) MEDICAL NECESSITY: I certify, that based on my findings, NURSING services are a medically necessary home health service. HOME BOUND STATUS: I certify that my clinical findings support that this patient is homebound (i.e., Due to illness or injury, pt requires aid of supportive devices such as crutches, cane, wheelchairs, walkers, the use of special transportation or the assistance of another person  to leave their place of residence. There is a normal inability to leave the home and doing so requires considerable and taxing effort. Other absences are for medical reasons / religious services and are infrequent or of short duration when for other reasons). If current dressing causes regression in wound condition, may D/C ordered dressing product/s and apply Normal Saline Moist Dressing daily until next [place] / Other MD appointment. [place] of regression in wound condition at [phone]. Please direct any NON-WOUND related issues/requests for orders to patient's Primary Care Physician I'm gonna switch the patient at this point to Endoform which I'm hopeful will help to get this area to fill in more quickly and effectively. The patient is in agreement that plan. We will subsequently see were things stand at follow-up. If anything changes or worsens meantime he will contact the office let me know. Please see above for specific wound care orders. We will see patient for re-evaluation in 2 week(s) here in the clinic. If anything worsens or changes patient will contact our office for additional recommendations. Electronic Signature(s) Signed: 03/05/2019 1:42:22 PM By: Worthy Keeler PA-C Entered By: Worthy Keeler on 03/05/2019 11:34:14 [name] ([id number]) -------------------------------------------------------------------------------- ROS/PFSH Details Patient Name: [name] Date of Service: 03/04/2019 1:45 PM Medical Record Number: [id number] Patient Account Number: [id number] Date of Birth/Sex: [birth year] (85 y.o. M) Treating RN: Harold Barban Primary Care Provider: Burman Freestone Other Clinician: Referring Provider: Burman Freestone Treating Provider/Extender: [name], HOYT Weeks in Treatment: 14 Information Obtained From Patient Constitutional Symptoms (General Health) Complaints and Symptoms: Negative for: Fatigue; Fever;  Chills; Marked Weight Change Respiratory Complaints and Symptoms: Negative for: Chronic or frequent coughs; Shortness of  Breath Medical History: Positive for: Asthma Negative for: Aspiration; Chronic Obstructive Pulmonary Disease (COPD); Pneumothorax; Sleep Apnea; Tuberculosis Cardiovascular Complaints and Symptoms: Negative for: Chest pain; LE edema Medical History: Positive for: Angina; Arrhythmia; Coronary Artery Disease; Hypertension; Myocardial Infarction - 2001 Negative for: Congestive Heart Failure; Deep Vein Thrombosis; Hypotension; Peripheral Arterial Disease; Peripheral Venous Disease; Phlebitis; Vasculitis Psychiatric Complaints and Symptoms: Negative for: Anxiety; Claustrophobia Medical History: Negative for: Anorexia/bulimia; Confinement Anxiety Eyes Medical History: Positive for: Cataracts - bilateral removal Negative for: Glaucoma; Optic Neuritis Ear/Nose/Mouth/Throat Medical History: Negative for: Chronic sinus problems/congestion; Middle ear problems Hematologic/Lymphatic Medical History: Negative for: Anemia; Hemophilia; Human Immunodeficiency Virus; Lymphedema; Sickle Cell Disease Gastrointestinal Medical History: Negative for: Cirrhosis ; Colitis; Crohnos; Hepatitis A; Hepatitis B; Hepatitis C Endocrine Medical History: Negative for: Type I Diabetes; Type II Diabetes DOYLE, KUNATH ([id number]) Past Medical History Notes: Borderline Genitourinary Medical History: Negative for: End Stage Renal Disease Immunological Medical History: Negative for: Lupus Erythematosus; Raynaudos; Scleroderma Integumentary (Skin) Medical History: Negative for: History of Burn; History of pressure wounds Musculoskeletal Medical History: Positive for: Osteoarthritis Negative for: Gout; Rheumatoid Arthritis; Osteomyelitis Neurologic Medical History: Positive for: Dementia; Neuropathy Negative for: Quadriplegia; Paraplegia; Seizure Disorder Oncologic Medical  History: Negative for: Received Chemotherapy; Received Radiation Past Medical History Notes: Melanoma on back HBO Extended History Items Eyes: Cataracts Immunizations Pneumococcal Vaccine: Received Pneumococcal Vaccination: Yes Implantable Devices No devices added Hospitalization / Surgery History Type of Hospitalization/Surgery Fall Family and Social History Cancer: Yes - Paternal Grandparents; Diabetes: Yes - Father; Heart Disease: Yes - Mother,Father; Hypertension: No; Kidney Disease: No; Lung Disease: No; Seizures: No; Stroke: Yes - Father; Thyroid Problems: No; Tuberculosis: No; Never smoker; Marital Status - Widowed; Alcohol Use: Never; Drug Use: No History; Caffeine Use: Daily; Financial Concerns: No; Food, Clothing or Shelter Needs: No; Support System Lacking: No; Transportation Concerns: No Physician Affirmation I have reviewed and agree with the above information. Electronic Signature(s) Signed: 03/05/2019 1:42:22 PM By: Worthy Keeler PA-C Signed: 03/05/2019 4:39:00 PM By: Harold Barban Entered By: Worthy Keeler on 03/05/2019 11:32:53 Avinash, Maltos [name] ([id number], SENAT ([id number]) -------------------------------------------------------------------------------- SuperBill Details Patient Name: ASHON, ROSENBERG. Date of Service: 03/04/2019 Medical Record Number: [id number] Patient Account Number: [id number] Date of Birth/Sex: December 11, 1930 (85 y.o. M) Treating RN: Harold Barban Primary Care Provider: Burman Freestone Other Clinician: Referring Provider: Burman Freestone Treating Provider/Extender: [name], HOYT Weeks in Treatment: 14 Diagnosis Coding ICD-10 Codes Code Description L89.154 Pressure ulcer of sacral region, stage 4 L02.31 Cutaneous abscess of buttock Facility Procedures CPT4 Code: [id number] Description: 66440 - WOUND CARE VISIT-LEV 3 EST PT Modifier: Quantity: 1 Physician Procedures CPT4 Code: 3474259 Description: 56387 - WC PHYS  LEVEL 4 - EST PT ICD-10 Diagnosis Description L89.154 Pressure ulcer of sacral region, stage 4 L02.31 Cutaneous abscess of buttock Modifier: Quantity: 1 Electronic Signature(s) Signed: 03/05/2019 1:42:22 PM By: Worthy Keeler PA-C Entered By: Worthy Keeler on 03/05/2019 11:34:23

## 2019-03-05 NOTE — Progress Notes (Signed)
BLAIZE, EPPLE ([id number]) Visit Report for 03/04/2019 Arrival Information Details Patient Name: [name], [name]. Date of Service: 03/04/2019 1:45 PM Medical Record Number: [id number] Patient Account Number: [id number] Date of Birth/Sex: [birth year] (85 y.o. M) Treating RN: Harold Barban Primary Care Dickey Caamano: Burman Freestone Other Clinician: Referring Donta Fuster: Burman Freestone Treating Jemaine Prokop/Extender: [name], HOYT Weeks in Treatment: 14 Visit Information History Since Last Visit Added or deleted any medications: No Patient Arrived: Cane Any new allergies or adverse reactions: No Arrival Time: 13:45 Had a fall or experienced change in No Accompanied By: wife and daughter activities of daily living that may affect Transfer Assistance: None risk of falls: Patient Identification Verified: Yes Signs or symptoms of abuse/neglect since last visito No Secondary Verification Process Yes Hospitalized since last visit: No Completed: Implantable device outside of the clinic excluding No Patient Has Alerts: Yes cellular tissue based products placed in the center Patient Alerts: Patient on Blood since last visit: Thinner Has Dressing in Place as Prescribed: Yes Pain Present Now: No Electronic Signature(s) Signed: 03/05/2019 1:59:56 PM By: Lorine Bears RCP, RRT, CHT Entered By: Lorine Bears on 03/04/2019 13:46:37 [name] ([id number]) -------------------------------------------------------------------------------- Clinic Level of Care Assessment Details Patient Name: [name] Date of Service: 03/04/2019 1:45 PM Medical Record Number: [id number] Patient Account Number: [id number] Date of Birth/Sex: [birth year] (85 y.o. M) Treating RN: Harold Barban Primary Care Fenton Candee: Burman Freestone Other Clinician: Referring Kari Kerth: Burman Freestone Treating Jarelle Ates/Extender: [name], HOYT Weeks in Treatment: 14 Clinic Level of Care  Assessment Items TOOL 4 Quantity Score []  - Use when only an EandM is performed on FOLLOW-UP visit 0 ASSESSMENTS - Nursing Assessment / Reassessment X - Reassessment of Co-morbidities (includes updates in patient status) 1 10 X- 1 5 Reassessment of Adherence to Treatment Plan ASSESSMENTS - Wound and Skin Assessment / Reassessment X - Simple Wound Assessment / Reassessment - one wound 1 5 []  - 0 Complex Wound Assessment / Reassessment - multiple wounds []  - 0 Dermatologic / Skin Assessment (not related to wound area) ASSESSMENTS - Focused Assessment []  - Circumferential Edema Measurements - multi extremities 0 []  - 0 Nutritional Assessment / Counseling / Intervention []  - 0 Lower Extremity Assessment (monofilament, tuning fork, pulses) []  - 0 Peripheral Arterial Disease Assessment (using hand held doppler) ASSESSMENTS - Ostomy and/or Continence Assessment and Care []  - Incontinence Assessment and Management 0 []  - 0 Ostomy Care Assessment and Management (repouching, etc.) PROCESS - Coordination of Care X - Simple Patient / Family Education for ongoing care 1 15 []  - 0 Complex (extensive) Patient / Family Education for ongoing care []  - 0 Staff obtains Programmer, systems, Records, Test Results / Process Orders X- 1 10 Staff telephones HHA, Nursing Homes / Clarify orders / etc []  - 0 Routine Transfer to another Facility (non-emergent condition) []  - 0 Routine Hospital Admission (non-emergent condition) []  - 0 New Admissions / Biomedical engineer / Ordering NPWT, Apligraf, etc. []  - 0 Emergency Hospital Admission (emergent condition) X- 1 10 Simple Discharge Coordination []  - 0 Complex (extensive) Discharge Coordination PROCESS - Special Needs []  - Pediatric / Minor Patient Management 0 []  - 0 Isolation Patient Management LELDON, STEEGE ([id number]) []  - 0 Hearing / Language / Visual special needs []  - 0 Assessment of Community assistance (transportation, D/C planning,  etc.) []  - 0 Additional assistance / Altered mentation []  - 0 Support Surface(s) Assessment (bed, cushion, seat, etc.) INTERVENTIONS - Wound Cleansing / Measurement X - Simple Wound Cleansing - one  wound 1 5 []  - 0 Complex Wound Cleansing - multiple wounds X- 1 5 Wound Imaging (photographs - any number of wounds) []  - 0 Wound Tracing (instead of photographs) X- 1 5 Simple Wound Measurement - one wound []  - 0 Complex Wound Measurement - multiple wounds INTERVENTIONS - Wound Dressings X - Small Wound Dressing one or multiple wounds 1 10 []  - 0 Medium Wound Dressing one or multiple wounds []  - 0 Large Wound Dressing one or multiple wounds []  - 0 Application of Medications - topical []  - 0 Application of Medications - injection INTERVENTIONS - Miscellaneous []  - External ear exam 0 []  - 0 Specimen Collection (cultures, biopsies, blood, body fluids, etc.) []  - 0 Specimen(s) / Culture(s) sent or taken to Lab for analysis []  - 0 Patient Transfer (multiple staff / Civil Service fast streamer / Similar devices) []  - 0 Simple Staple / Suture removal (25 or less) []  - 0 Complex Staple / Suture removal (26 or more) []  - 0 Hypo / Hyperglycemic Management (close monitor of Blood Glucose) []  - 0 Ankle / Brachial Index (ABI) - do not check if billed separately X- 1 5 Vital Signs Has the patient been seen at the hospital within the last three years: Yes Total Score: 85 Level Of Care: New/Established - Level 3 Electronic Signature(s) Signed: 03/04/2019 4:38:46 PM By: Harold Barban Entered By: Harold Barban on 03/04/2019 15:11:31 [name] ([id number]) -------------------------------------------------------------------------------- Encounter Discharge Information Details Patient Name: [name] Date of Service: 03/04/2019 1:45 PM Medical Record Number: [id number] Patient Account Number: [id number] Date of Birth/Sex: Jul 31, 1931 (85 y.o. M) Treating RN: Harold Barban Primary  Care Lakrisha Iseman: Burman Freestone Other Clinician: Referring Zohaib Heeney: Burman Freestone Treating Courteny Egler/Extender: [name], HOYT Weeks in Treatment: 14 Encounter Discharge Information Items Discharge Condition: Stable Ambulatory Status: Cane Discharge Destination: Home Transportation: Private Auto Accompanied By: self Schedule Follow-up Appointment: Yes Clinical Summary of Care: Electronic Signature(s) Signed: 03/04/2019 4:38:46 PM By: Harold Barban Entered By: Harold Barban on 03/04/2019 15:19:17 [name] ([id number]) -------------------------------------------------------------------------------- Lower Extremity Assessment Details Patient Name: [name] Date of Service: 03/04/2019 1:45 PM Medical Record Number: [id number] Patient Account Number: [id number] Date of Birth/Sex: 1931/06/09 (85 y.o. M) Treating RN: Harold Barban Primary Care Analyn Matusek: Burman Freestone Other Clinician: Referring Ronne Stefanski: Burman Freestone Treating Kirstine Jacquin/Extender: [name], HOYT Weeks in Treatment: 14 Electronic Signature(s) Signed: 03/04/2019 4:38:46 PM By: Harold Barban Entered By: Harold Barban on 03/04/2019 14:38:32 [name] ([id number]) -------------------------------------------------------------------------------- Multi Wound Chart Details Patient Name: [name] Date of Service: 03/04/2019 1:45 PM Medical Record Number: [id number] Patient Account Number: [id number] Date of Birth/Sex: 11/19/1930 (85 y.o. M) Treating RN: Harold Barban Primary Care Deane Wattenbarger: Burman Freestone Other Clinician: Referring Breionna Punt: Burman Freestone Treating Albeiro Trompeter/Extender: [name], HOYT Weeks in Treatment: 14 Vital Signs Height(in): 69 Pulse(bpm): 61 Weight(lbs): 170.6 Blood Pressure(mmHg): 158/72 Body Mass Index(BMI): 25 Temperature(F): 98.2 Respiratory Rate 16 (breaths/min): Photos: [N/A:N/A] Wound Location: Sacrum - Midline N/A N/A Wounding Event:  Gradually Appeared N/A N/A Primary Etiology: Pressure Ulcer N/A N/A Secondary Etiology: Abscess N/A N/A Comorbid History: Cataracts, Asthma, Angina, N/A N/A Arrhythmia, Coronary Artery Disease, Hypertension, Myocardial Infarction, Osteoarthritis, Dementia, Neuropathy Date Acquired: 10/30/2018 N/A N/A Weeks of Treatment: 14 N/A N/A Wound Status: Open N/A N/A Measurements L x W x D 0.4x0.2x1 N/A N/A (cm) Area (cm) : 0.063 N/A N/A Volume (cm) : 0.063 N/A N/A % Reduction in Area: -687.50% N/A N/A % Reduction in Volume: -215.00% N/A N/A Classification: Category/Stage IV N/A N/A Exudate  Amount: Medium N/A N/A Exudate Type: Purulent N/A N/A Exudate Color: yellow, brown, green N/A N/A Wound Margin: Indistinct, nonvisible N/A N/A Granulation Amount: Medium (34-66%) N/A N/A Granulation Quality: Pale, Hyper-granulation N/A N/A Necrotic Amount: Small (1-33%) N/A N/A Exposed Structures: Fat Layer (Subcutaneous N/A N/A Tissue) Exposed: Yes Fascia: No Tendon: No Muscle: No Joint: No Bone: No Epithelialization: Medium (34-66%) N/A N/A Treatment Notes ANANTH, FIALLOS ([id number]) Electronic Signature(s) Signed: 03/04/2019 4:38:46 PM By: Harold Barban Entered By: Harold Barban on 03/04/2019 15:04:34 [name] ([id number]) -------------------------------------------------------------------------------- Multi-Disciplinary Care Plan Details Patient Name: [name] Date of Service: 03/04/2019 1:45 PM Medical Record Number: [id number] Patient Account Number: [id number] Date of Birth/Sex: October 04, 1931 (85 y.o. M) Treating RN: Harold Barban Primary Care Ravan Schlemmer: Burman Freestone Other Clinician: Referring Emojean Gertz: Burman Freestone Treating Madalyn Legner/Extender: [name], HOYT Weeks in Treatment: 14 Active Inactive Abuse / Safety / Falls / Self Care Management Nursing Diagnoses: Potential for falls Goals: Patient will not experience any injury related to falls Date  Initiated: 11/20/2018 Target Resolution Date: 02/15/2019 Goal Status: Active Interventions: Assess fall risk on admission and as needed Notes: Orientation to the Wound Care Program Nursing Diagnoses: Knowledge deficit related to the wound healing center program Goals: Patient/caregiver will verbalize understanding of the [facility] Date Initiated: 11/20/2018 Target Resolution Date: 02/15/2019 Goal Status: Active Interventions: Provide education on orientation to the wound center Notes: Wound/Skin Impairment Nursing Diagnoses: Impaired tissue integrity Goals: Ulcer/skin breakdown will heal within 14 weeks Date Initiated: 11/20/2018 Target Resolution Date: 02/15/2019 Goal Status: Active Interventions: Assess patient/caregiver ability to obtain necessary supplies Assess patient/caregiver ability to perform ulcer/skin care regimen upon admission and as needed Assess ulceration(s) every visit Notes: Electronic Signature(s) Signed: 03/04/2019 4:38:46 PM By: Bryson Dames ([id number]) Entered By: Harold Barban on 03/04/2019 15:05:54 [name] ([id number]) -------------------------------------------------------------------------------- Pain Assessment Details Patient Name: [name] Date of Service: 03/04/2019 1:45 PM Medical Record Number: [id number] Patient Account Number: [id number] Date of Birth/Sex: [birth year] (85 y.o. M) Treating RN: Harold Barban Primary Care Neko Mcgeehan: Burman Freestone Other Clinician: Referring Liyat Faulkenberry: Burman Freestone Treating Latoshia Monrroy/Extender: [name], HOYT Weeks in Treatment: 14 Active Problems Location of Pain Severity and Description of Pain Patient Has Paino No Site Locations Pain Management and Medication Current Pain Management: Electronic Signature(s) Signed: 03/04/2019 4:38:46 PM By: Harold Barban Signed: 03/05/2019 1:59:56 PM By: Lorine Bears RCP, RRT, CHT Entered By:  Lorine Bears on 03/04/2019 13:46:46 [name] ([id number]) -------------------------------------------------------------------------------- Patient/Caregiver Education Details Patient Name: [name] Date of Service: 03/04/2019 1:45 PM Medical Record Number: [id number] Patient Account Number: [id number] Date of Birth/Gender: [birth year] (85 y.o. M) Treating RN: Harold Barban Primary Care Physician: Burman Freestone Other Clinician: Referring Physician: Burman Freestone Treating Physician/Extender: Sharalyn Ink in Treatment: 14 Education Assessment Education Provided To: Patient Education Topics Provided Wound/Skin Impairment: Handouts: Caring for Your Ulcer Methods: Demonstration, Explain/Verbal Responses: State content correctly Electronic Signature(s) Signed: 03/04/2019 4:38:46 PM By: Harold Barban Entered By: Harold Barban on 03/04/2019 15:05:02 [name] ([id number]) -------------------------------------------------------------------------------- Wound Assessment Details Patient Name: [name] Date of Service: 03/04/2019 1:45 PM Medical Record Number: [id number] Patient Account Number: [id number] Date of Birth/Sex: November 15, 1930 (85 y.o. M) Treating RN: Harold Barban Primary Care Luisa Louk: Burman Freestone Other Clinician: Referring Charday Capetillo: Burman Freestone Treating Keyira Mondesir/Extender: STONE III, HOYT Weeks in Treatment: 14 Wound Status Wound Number: 2 Primary Pressure Ulcer Etiology: Wound Location: Sacrum - Midline Secondary Abscess Wounding Event: Gradually Appeared Etiology: Date Acquired:  10/30/2018 Wound Open Weeks Of Treatment: 14 Status: Clustered Wound: No Comorbid Cataracts, Asthma, Angina, Arrhythmia, History: Coronary Artery Disease, Hypertension, Myocardial Infarction, Osteoarthritis, Dementia, Neuropathy Photos Wound Measurements Length: (cm) 0.4 Width: (cm) 0.2 Depth: (cm) 1 Area: (cm)  0.063 Volume: (cm) 0.063 % Reduction in Area: -687.5% % Reduction in Volume: -215% Epithelialization: Medium (34-66%) Tunneling: No Undermining: No Wound Description Classification: Category/Stage IV Foul Od Wound Margin: Indistinct, nonvisible Slough/ Exudate Amount: Medium Exudate Type: Purulent Exudate Color: yellow, brown, green or After Cleansing: No Fibrino No Wound Bed Granulation Amount: Medium (34-66%) Exposed Structure Granulation Quality: Pale, Hyper-granulation Fascia Exposed: No Necrotic Amount: Small (1-33%) Fat Layer (Subcutaneous Tissue) Exposed: Yes Necrotic Quality: Adherent Slough Tendon Exposed: No Muscle Exposed: No Joint Exposed: No Bone Exposed: No Treatment Notes Wound #2 (Midline Sacrum) Notes Endoform, BFD ELCHANAN, BOB ([id number]) Electronic Signature(s) Signed: 03/04/2019 4:38:46 PM By: Harold Barban Entered By: Harold Barban on 03/04/2019 14:38:06 [name] ([id number]) -------------------------------------------------------------------------------- Vitals Details Patient Name: [name] Date of Service: 03/04/2019 1:45 PM Medical Record Number: [id number] Patient Account Number: [id number] Date of Birth/Sex: Jan 29, 1931 (85 y.o. M) Treating RN: Harold Barban Primary Care Jamorion Gomillion: Burman Freestone Other Clinician: Referring Eileen Croswell: Burman Freestone Treating Clarence Dunsmore/Extender: [name], HOYT Weeks in Treatment: 14 Vital Signs Time Taken: 13:45 Temperature (F): 98.2 Height (in): 69 Pulse (bpm): 61 Weight (lbs): 170.6 Respiratory Rate (breaths/min): 16 Body Mass Index (BMI): 25.2 Blood Pressure (mmHg): 158/72 Reference Range: 80 - 120 mg / dl Electronic Signature(s) Signed: 03/05/2019 1:59:56 PM By: Lorine Bears RCP, RRT, CHT Entered By: Lorine Bears on 03/04/2019 13:50:12

## 2019-03-14 ENCOUNTER — Ambulatory Visit: Payer: Medicare Other | Admitting: Physician Assistant

## 2019-03-18 ENCOUNTER — Other Ambulatory Visit: Payer: Self-pay

## 2019-03-18 ENCOUNTER — Encounter: Payer: Medicare Other | Attending: Physician Assistant | Admitting: Physician Assistant

## 2019-03-18 DIAGNOSIS — Z833 Family history of diabetes mellitus: Secondary | ICD-10-CM | POA: Insufficient documentation

## 2019-03-18 DIAGNOSIS — G629 Polyneuropathy, unspecified: Secondary | ICD-10-CM | POA: Insufficient documentation

## 2019-03-18 DIAGNOSIS — I10 Essential (primary) hypertension: Secondary | ICD-10-CM | POA: Diagnosis not present

## 2019-03-18 DIAGNOSIS — L89154 Pressure ulcer of sacral region, stage 4: Secondary | ICD-10-CM | POA: Diagnosis present

## 2019-03-18 DIAGNOSIS — Z823 Family history of stroke: Secondary | ICD-10-CM | POA: Insufficient documentation

## 2019-03-18 DIAGNOSIS — Z8249 Family history of ischemic heart disease and other diseases of the circulatory system: Secondary | ICD-10-CM | POA: Insufficient documentation

## 2019-03-18 DIAGNOSIS — I252 Old myocardial infarction: Secondary | ICD-10-CM | POA: Insufficient documentation

## 2019-03-18 DIAGNOSIS — Z809 Family history of malignant neoplasm, unspecified: Secondary | ICD-10-CM | POA: Insufficient documentation

## 2019-03-18 DIAGNOSIS — Z85820 Personal history of malignant melanoma of skin: Secondary | ICD-10-CM | POA: Diagnosis not present

## 2019-03-18 DIAGNOSIS — M1990 Unspecified osteoarthritis, unspecified site: Secondary | ICD-10-CM | POA: Insufficient documentation

## 2019-03-18 DIAGNOSIS — F0390 Unspecified dementia without behavioral disturbance: Secondary | ICD-10-CM | POA: Diagnosis not present

## 2019-03-18 DIAGNOSIS — I2510 Atherosclerotic heart disease of native coronary artery without angina pectoris: Secondary | ICD-10-CM | POA: Insufficient documentation

## 2019-03-18 NOTE — Progress Notes (Signed)
[name], [name] ([id number]) Visit Report for 03/18/2019 Arrival Information Details Patient Name: [name], [name]. Date of Service: 03/18/2019 1:45 PM Medical Record Number: [id number] Patient Account Number: [id number] Date of Birth/Sex: July 24, 1931 (85 y.o. M) Treating RN: Harold Barban Primary Care Darius Fillingim: Burman Freestone Other Clinician: Referring Cecil Bixby: Burman Freestone Treating Jessice Madill/Extender: [name], [name] Weeks in Treatment: 16 Visit Information History Since Last Visit Added or deleted any medications: No Patient Arrived: Cane Any new allergies or adverse reactions: No Arrival Time: 14:30 Had a fall or experienced change in No Accompanied By: daughter activities of daily living that may affect Transfer Assistance: None risk of falls: Patient Identification Verified: Yes Signs or symptoms of abuse/neglect since last visito No Secondary Verification Process Yes Hospitalized since last visit: No Completed: Has Dressing in Place as Prescribed: Yes Patient Has Alerts: Yes Pain Present Now: No Patient Alerts: Patient on Blood Thinner Electronic Signature(s) Signed: 03/18/2019 3:11:16 PM By: Harold Barban Entered By: Harold Barban on 03/18/2019 14:34:32 [name] ([id number]) -------------------------------------------------------------------------------- Clinic Level of Care Assessment Details Patient Name: [name] Date of Service: 03/18/2019 1:45 PM Medical Record Number: [id number] Patient Account Number: [id number] Date of Birth/Sex: Mar 30, 1931 (85 y.o. M) Treating RN: Harold Barban Primary Care Troye Hiemstra: Burman Freestone Other Clinician: Referring Myelle Poteat: Burman Freestone Treating Zakhia Seres/Extender: [name], [name] Weeks in Treatment: 16 Clinic Level of Care Assessment Items TOOL 4 Quantity Score []  - Use when only an EandM is performed on FOLLOW-UP visit 0 ASSESSMENTS - Nursing Assessment / Reassessment X - Reassessment of  Co-morbidities (includes updates in patient status) 1 10 X- 1 5 Reassessment of Adherence to Treatment Plan ASSESSMENTS - Wound and Skin Assessment / Reassessment X - Simple Wound Assessment / Reassessment - one wound 1 5 []  - 0 Complex Wound Assessment / Reassessment - multiple wounds []  - 0 Dermatologic / Skin Assessment (not related to wound area) ASSESSMENTS - Focused Assessment []  - Circumferential Edema Measurements - multi extremities 0 []  - 0 Nutritional Assessment / Counseling / Intervention []  - 0 Lower Extremity Assessment (monofilament, tuning fork, pulses) []  - 0 Peripheral Arterial Disease Assessment (using hand held doppler) ASSESSMENTS - Ostomy and/or Continence Assessment and Care []  - Incontinence Assessment and Management 0 []  - 0 Ostomy Care Assessment and Management (repouching, etc.) PROCESS - Coordination of Care X - Simple Patient / Family Education for ongoing care 1 15 []  - 0 Complex (extensive) Patient / Family Education for ongoing care []  - 0 Staff obtains Programmer, systems, Records, Test Results / Process Orders []  - 0 Staff telephones HHA, Nursing Homes / Clarify orders / etc []  - 0 Routine Transfer to another Facility (non-emergent condition) []  - 0 Routine Hospital Admission (non-emergent condition) []  - 0 New Admissions / Biomedical engineer / Ordering NPWT, Apligraf, etc. []  - 0 Emergency Hospital Admission (emergent condition) X- 1 10 Simple Discharge Coordination [name], [name] ([id number]) []  - 0 Complex (extensive) Discharge Coordination PROCESS - Special Needs []  - Pediatric / Minor Patient Management 0 []  - 0 Isolation Patient Management []  - 0 Hearing / Language / Visual special needs []  - 0 Assessment of Community assistance (transportation, D/C planning, etc.) []  - 0 Additional assistance / Altered mentation []  - 0 Support Surface(s) Assessment (bed, cushion, seat, etc.) INTERVENTIONS - Wound Cleansing / Measurement X -  Simple Wound Cleansing - one wound 1 5 []  - 0 Complex Wound Cleansing - multiple wounds X- 1 5 Wound Imaging (photographs - any number of wounds) []  - 0  Wound Tracing (instead of photographs) X- 1 5 Simple Wound Measurement - one wound []  - 0 Complex Wound Measurement - multiple wounds INTERVENTIONS - Wound Dressings X - Small Wound Dressing one or multiple wounds 1 10 []  - 0 Medium Wound Dressing one or multiple wounds []  - 0 Large Wound Dressing one or multiple wounds []  - 0 Application of Medications - topical []  - 0 Application of Medications - injection INTERVENTIONS - Miscellaneous []  - External ear exam 0 []  - 0 Specimen Collection (cultures, biopsies, blood, body fluids, etc.) []  - 0 Specimen(s) / Culture(s) sent or taken to Lab for analysis []  - 0 Patient Transfer (multiple staff / Civil Service fast streamer / Similar devices) []  - 0 Simple Staple / Suture removal (25 or less) []  - 0 Complex Staple / Suture removal (26 or more) []  - 0 Hypo / Hyperglycemic Management (close monitor of Blood Glucose) []  - 0 Ankle / Brachial Index (ABI) - do not check if billed separately X- 1 5 Vital Signs [name], [name] ([id number]) Has the patient been seen at the hospital within the last three years: Yes Total Score: 75 Level Of Care: New/Established - Level 2 Electronic Signature(s) Signed: 03/18/2019 3:11:16 PM By: Harold Barban Entered By: Harold Barban on 03/18/2019 14:42:39 [name] ([id number]) -------------------------------------------------------------------------------- Lower Extremity Assessment Details Patient Name: [name] Date of Service: 03/18/2019 1:45 PM Medical Record Number: [id number] Patient Account Number: [id number] Date of Birth/Sex: 1931/10/08 (85 y.o. M) Treating RN: Harold Barban Primary Care Garnette Greb: Burman Freestone Other Clinician: Referring Arta Stump: Burman Freestone Treating Trino Higinbotham/Extender: [name], [name] Weeks in Treatment:  16 Electronic Signature(s) Signed: 03/18/2019 3:11:16 PM By: Harold Barban Entered By: Harold Barban on 03/18/2019 14:39:25 [name] ([id number]) -------------------------------------------------------------------------------- Multi Wound Chart Details Patient Name: [name] Date of Service: 03/18/2019 1:45 PM Medical Record Number: [id number] Patient Account Number: [id number] Date of Birth/Sex: [birth year] (85 y.o. M) Treating RN: Harold Barban Primary Care Race Latour: Burman Freestone Other Clinician: Referring Meygan Kyser: Burman Freestone Treating Kamyrah Feeser/Extender: [name], [name] Weeks in Treatment: 16 Vital Signs Height(in): 69 Pulse(bpm): 60 Weight(lbs): 170.6 Blood Pressure(mmHg): 173/69 Body Mass Index(BMI): 25 Temperature(F): 98.4 Respiratory Rate 16 (breaths/min): Photos: [N/A:N/A] Wound Location: Sacrum - Midline N/A N/A Wounding Event: Gradually Appeared N/A N/A Primary Etiology: Pressure Ulcer N/A N/A Secondary Etiology: Abscess N/A N/A Comorbid History: Cataracts, Asthma, Angina, N/A N/A Arrhythmia, Coronary Artery Disease, Hypertension, Myocardial Infarction, Osteoarthritis, Dementia, Neuropathy Date Acquired: 10/30/2018 N/A N/A Weeks of Treatment: 16 N/A N/A Wound Status: Open N/A N/A Measurements L x W x D 0.1x0.1x0.1 N/A N/A (cm) Area (cm) : 0.008 N/A N/A Volume (cm) : 0.001 N/A N/A % Reduction in Area: 0.00% N/A N/A % Reduction in Volume: 95.00% N/A N/A Classification: Category/Stage IV N/A N/A Exudate Amount: None Present N/A N/A Wound Margin: Indistinct, nonvisible N/A N/A Granulation Amount: Medium (34-66%) N/A N/A Granulation Quality: Pale, Hyper-granulation N/A N/A Necrotic Amount: Small (1-33%) N/A N/A Exposed Structures: Fascia: No N/A N/A Fat Layer (Subcutaneous Tissue) Exposed: No Tendon: No [name], [name] ([id number]) Muscle: No Joint: No Bone: No Epithelialization: Medium (34-66%) N/A N/A Treatment  Notes Electronic Signature(s) Signed: 03/18/2019 3:11:16 PM By: Harold Barban Entered By: Harold Barban on 03/18/2019 14:41:34 [name] ([id number]) -------------------------------------------------------------------------------- Multi-Disciplinary Care Plan Details Patient Name: [name] Date of Service: 03/18/2019 1:45 PM Medical Record Number: [id number] Patient Account Number: [id number] Date of Birth/Sex: 1931-10-08 (85 y.o. M) Treating RN: Harold Barban Primary Care Grace Haggart: Burman Freestone Other Clinician: Referring Jimie Kuwahara:  [name], [name] Treating Nathasha Fiorillo/Extender: [name], [name] Weeks in Treatment: 16 Active Inactive Electronic Signature(s) Signed: 03/18/2019 3:11:16 PM By: Harold Barban Entered By: Harold Barban on 03/18/2019 14:46:46 [name] ([id number]) -------------------------------------------------------------------------------- Pain Assessment Details Patient Name: [name] Date of Service: 03/18/2019 1:45 PM Medical Record Number: [id number] Patient Account Number: [id number] Date of Birth/Sex: Apr 09, 1931 (85 y.o. M) Treating RN: Harold Barban Primary Care Tawnya Pujol: Burman Freestone Other Clinician: Referring Zakkiyya Barno: Burman Freestone Treating Saturnino Liew/Extender: [name], [name] Weeks in Treatment: 16 Active Problems Location of Pain Severity and Description of Pain Patient Has Paino No Site Locations Pain Management and Medication Current Pain Management: Electronic Signature(s) Signed: 03/18/2019 3:11:16 PM By: Harold Barban Entered By: Harold Barban on 03/18/2019 14:34:40 [name] ([id number]) -------------------------------------------------------------------------------- Patient/Caregiver Education Details Patient Name: [name] Date of Service: 03/18/2019 1:45 PM Medical Record Number: [id number] Patient Account Number: [id number] Date of Birth/Gender: [birth year] (85 y.o. M) Treating RN:  Harold Barban Primary Care Physician: Burman Freestone Other Clinician: Referring Physician: Burman Freestone Treating Physician/Extender: [name] in Treatment: 16 Education Assessment Education Provided To: Patient Education Topics Provided Wound/Skin Impairment: Handouts: Caring for Your Ulcer Methods: Demonstration, Explain/Verbal Responses: State content correctly Electronic Signature(s) Signed: 03/18/2019 3:11:16 PM By: Harold Barban Entered By: Harold Barban on 03/18/2019 14:41:59 [name] ([id number]) -------------------------------------------------------------------------------- Wound Assessment Details Patient Name: [name] Date of Service: 03/18/2019 1:45 PM Medical Record Number: [id number] Patient Account Number: [id number] Date of Birth/Sex: 11/25/1930 (85 y.o. M) Treating RN: Harold Barban Primary Care Doniven Vanpatten: Burman Freestone Other Clinician: Referring Janecia Palau: Burman Freestone Treating Taetum Flewellen/Extender: [name], [name] Weeks in Treatment: 16 Wound Status Wound Number: 2 Primary Pressure Ulcer Etiology: Wound Location: Midline Sacrum Secondary Abscess Wounding Event: Gradually Appeared Etiology: Date Acquired: 10/30/2018 Wound Healed - Epithelialized Weeks Of Treatment: 16 Status: Clustered Wound: No Comorbid Cataracts, Asthma, Angina, Arrhythmia, History: Coronary Artery Disease, Hypertension, Myocardial Infarction, Osteoarthritis, Dementia, Neuropathy Photos Wound Measurements Length: (cm) 0 % Reduct Width: (cm) 0 % Reduct Depth: (cm) 0 Epitheli Area: (cm) 0 Tunneli Volume: (cm) 0 Undermi ion in Area: 100% ion in Volume: 100% alization: Medium (34-66%) ng: No ning: No Wound Description Classification: Category/Stage IV Foul Odo Wound Margin: Indistinct, nonvisible Slough/F Exudate Amount: None Present r After Cleansing: No ibrino No Wound Bed Granulation Amount: Medium (34-66%) Exposed  Structure Granulation Quality: Pale, Hyper-granulation Fascia Exposed: No Necrotic Amount: Small (1-33%) Fat Layer (Subcutaneous Tissue) Exposed: No Necrotic Quality: Adherent Slough Tendon Exposed: No Muscle Exposed: No Joint Exposed: No Bone Exposed: No [name], [name] ([id number]) Electronic Signature(s) Signed: 03/18/2019 3:11:16 PM By: Harold Barban Entered By: Harold Barban on 03/18/2019 14:47:34 [name] ([id number]) -------------------------------------------------------------------------------- Vitals Details Patient Name: [name] Date of Service: 03/18/2019 1:45 PM Medical Record Number: [id number] Patient Account Number: [id number] Date of Birth/Sex: January 10, 1931 (85 y.o. M) Treating RN: Harold Barban Primary Care Jaasiel Hollyfield: Burman Freestone Other Clinician: Referring Pearl Bents: Burman Freestone Treating Ezariah Nace/Extender: [name], [name] Weeks in Treatment: 16 Vital Signs Time Taken: 14:33 Temperature (F): 98.4 Height (in): 69 Pulse (bpm): 60 Weight (lbs): 170.6 Respiratory Rate (breaths/min): 16 Body Mass Index (BMI): 25.2 Blood Pressure (mmHg): 173/69 Reference Range: 80 - 120 mg / dl Electronic Signature(s) Signed: 03/18/2019 3:11:16 PM By: Harold Barban Entered By: Harold Barban on 03/18/2019 14:35:07

## 2019-03-18 NOTE — Progress Notes (Signed)
RAINIER, FEUERBORN ([id number]) Visit Report for 03/18/2019 Chief Complaint Document Details Patient Name: [name]. Date of Service: 03/18/2019 1:45 PM Medical Record Number: [id number] Patient Account Number: [id number] Date of Birth/Sex: [birth year] (85 y.o. M) Treating RN: Harold Barban Primary Care Provider: Burman Freestone Other Clinician: Referring Provider: Burman Freestone Treating Provider/Extender: [name], HOYT Weeks in Treatment: 16 Information Obtained from: Patient Chief Complaint Sacral pressure ulcer Electronic Signature(s) Signed: 03/18/2019 3:03:34 PM By: Worthy Keeler PA-C Entered By: Worthy Keeler on 03/18/2019 14:23:49 [name] ([id number]) -------------------------------------------------------------------------------- HPI Details Patient Name: [name] Date of Service: 03/18/2019 1:45 PM Medical Record Number: [id number] Patient Account Number: [id number] Date of Birth/Sex: Dec 29, 1930 (85 y.o. M) Treating RN: Harold Barban Primary Care Provider: Burman Freestone Other Clinician: Referring Provider: Burman Freestone Treating Provider/Extender: [name], HOYT Weeks in Treatment: 16 History of Present Illness HPI Description: 02/06/18 on evaluation today patient presents for initial evaluation and our clinic concerning an issue which began roughly 3 weeks ago when the patient fell in his home on the floor in his kitchen and laid him down this detergent for roughly 3 days. He had a pressure injury to the left shoulder. This unfortunately has caused him a lot of discomfort although it finally seems to be doing better if anything is really having a lot of itching right now. This appears potentially be a contact dermatitis issue. He also has a significant pressure injury to the sacrum at this time as well which is also showing fascia exposure right over the bone but no evidence of bone exposure at this point which is good news. They have been  using Santyl as well as Saline soaked gauze at this point in time. There does appear to be a lot of necrotic slough in the base of the wound. He does have a history of incontinence, myocardial infarction, and hypertension. He also is "borderline diabetic" hemoglobin A1c of 6.0. Currently he has some discomfort in the pressure site at the sacrum but fortunately nothing too significant this did require sharp debridement today. 02/13/18 on evaluation today patient appears to be doing much better in regard to his sacral wound. He has been tolerating the dressing changes without complication with the Vashe. Fortunately there is no evidence of infection and though there is some Slough on the surface of the wound bed he has excellent granulation noted. Overall I'm pleased with how things have progressed in that regard. A glance at his shoulder as well and the rash seems to be someone improving in my pinion at this site as well. Overall I am pleased with what we're seeing and so is the family. 02/20/18 on evaluation today patient appears to be doing a little worse in regard to the sacral wound only in the fact that there is redness surrounding it has me somewhat concerned for infection. The drainage has also apparently been a little bit off color compared to normal according to family they did keep the dressing today that was removed to show me and I agree this seems to be a little bit different compared to what we have been seeing. Coupled with the redness I'm concerned he may be developing some cellulitis surrounding the wound bed. 02/27/18 on evaluation today patient presents for follow-up concerning his sacral ulcer. We have received the results back from his wound culture which shows unfortunately that the doxycycline will not be of benefit for him I am going to need to initiate treatment with something else  in order to treat the pseudomonas. Otherwise he does not seem to be having any significant pain  although his daughter states there are sometimes when he states having pain. We continue to use the Vashe currently. 03/06/18 on evaluation today patient's sacral wound appears to be doing better in my opinion. He has been tolerating the dressing changes without complication. With that being said the silver nitrate has helped with the prominent area of hyper granulation at the 6 o'clock location we will likely need to repeat this again today. Nonetheless overall I am pleased with how things have improved over the last week. The erythema surrounding the wound seems to be greatly improved. 03/13/18 on evaluation today patient's wound actually does not appear to be terribly infected although she does continue to have erythema surrounding the wound bed especially on the left border. I'm still somewhat concerned about the fact that the oral antibiotics alone may not be completely treating his infection. I previously discussed may need to go for IV antibiotic therapy I'm concerned that may be the case. We will need to make a referral today for infectious disease. 03/20/18 on evaluation today the patient sacral wound actually appears to be doing fairly well in regard to granulation although he continues unfortunately to have it your theme is surrounding the periwound region. There's also some increased swelling at the 6 o'clock location which also has me somewhat concerned. With that being said he does have some discomfort but nothing too significant at this point. He still has not heard from infectious disease his daughter and wife are both present during the office visit today they're going to check back with this again. We did get the information for them to call them today. 03/27/18 on evaluation today patient is seen concerning his ongoing sacral ulcer. He has been tolerating the dressing changes without complication. With that being said he does present with evidence of bright green drainage noted on the  dressing which again is something that I do often expect to see with a pseudomonas infection. He continues to have your theme is [name], [name] ([id number]) surrounding the wound bed as well and again I'm not 100% convinced this is just pressure related. I did speak with Colletta Maryland who is the nurse practitioner in [place] with infectious disease. I spoke with her actually yesterday concerning this patient. She is not 100% convinced that this is infected. She question whether the wound may just be colonized with Pseudomonas and not actually causing an active infection. I am really not thinking that the edema is associated with pressure alone and again overall I don't feel that your theme and is consistent with a pressure injury either as he's never had any contusions noted like a deep tissue injury on his heel which was new and I did visualize today this was on the left heel. Nonetheless she wanted to give this a little bit more time and thought it would be appropriate to start the Wound VAC at this point. 04/03/18 on evaluation today patient sacral ulcer actually appears to be doing fairly well at this point. He has been tolerating the dressing changes without complication. With that being said I'm very pleased with the progress that has been made in regard to his sacral wound over the past week I do not see as much in the way of erythema which is great news. Nonetheless he does have a small area of hyper granulation unfortunately. This is at roughly the 7 o'clock location and I  think does need to be addressed so that this will heal more appropriately. Nonetheless I think we may be ready to go ahead and initiate therapy with the Wound VAC. 04/10/18 on evaluation today patient appears to be doing excellent in regard to his sacral ulcer. The show signs of great improvement in overall I'm very pleased with how things look. He has been tolerating the dressing changes without complication.  Specifically this is the Wound VAC. He also seems to be doing well with the antibiotic there is decreased your theme and redness surrounding the sacral area at this point in the wound has filled in quite significantly. 04/17/18 on evaluation today patient actually appears to be doing excellent in regard to his sacral ulcer. He's been tolerating the dressing changes without complication specifically the Wound VAC. There really are no major concerns from the patient nor family this point he is having no pain. He does have a little bit of Epiboly on the lateral portions of the wound where he does have a little bit more depth that will need to be addressed today. 04/23/18 on evaluation today patient's wound actually appears to be doing excellent at this point. He has been tolerating the Wound VAC and this appears to be doing well other than the fact that it seems to be breaking seal at the 6 o'clock location. I do believe that adding a duodenum dressing at this location try and help maintain the seal would be appropriate and likely very effective. With that being said he overall seems to be showing signs of good improvement at this point. There does not appear to be any evidence of significant infection which is also excellent news. 04/30/18 on evaluation today patient actually appears to be doing well in regard to his sacral ulcer. He's been tolerating the dressing changes without complication. Fortunately there does not appear to be any evidence of infection. Overall I'm very pleased with the progress that has been made up to this point. He does have some blistering underneath the draping unfortunately although this is definitely something that has been noted on other patients previously is a fairly common occurrence. Nonetheless the patient seems to be doing fairly well in general in my pinion based on what I see at this time. I do believe these are fairly superficial and minor. 05/07/18 on evaluation  today patient's wound actually appears to be doing excellent at this point in time. He has been tolerating the Wound VAC decently well he states that it is somewhat cumbersome to carry around unfortunately. The only other issue he's been having according to family is that they been having a difficult time keeping the Wound VAC in place and doing what is supposed to do without making. Obviously I do think that this is definitely of concern. Nonetheless I do believe she's made good progress up to this point. I'm very happy in that regard. 05/14/18 on evaluation today patient's wound continues to make good progress at this point. He had a minimal amount of slough noted on the surface which was easily wiped away with saline and gauze and in general I feel like that he is continuing to show excellent progress even with the discontinuation of the Wound VAC. Overall I'm pleased in this regard. He was having issues with the Wound VAC in getting it to seal I think that using the Prisma at this time has been equally efficient and getting the wound to diminish in size. 05/29/18- He is here in follow up  evaluation for a sacral ulcer. There is improvement, we will continue with prisma and he will follow up in two weeks 06/11/18 on evaluation today patient had unfortunately bright green drainage on the dressing upon evaluation today. This is something that we have encountered before although we were able to get things under control previously with antibiotics. With that being said currently upon further inspection of the three areas of hyper granulation that were separate from the actual wound itself which was almost healed it really appears that these all have some depth to them. They are more tunnels that really have not closed or at least have reopened as a result likely of infection in my pinion. This is definitely not what I was expecting or hoping for. [name], [name] ([id number]) 06/26/18 on evaluation  today patient continues to experience issues with what appears to be small abscesses in the sacral region unfortunately. With that being said he has been tolerating the dressing changes without complication which is good news. He's not having any significant discomfort which is also good news. With that being said his daughter states that after he left last week that the packing that we have placed fell out quite rapidly and he subsequently healed over very quick to the point they were not able to even repack the regions. Nonetheless there appear to be several fluctuance areas noted at this point there's one central region that does seem to be draining still discharge that is somewhat green in color. I did review the results of the wound culture which did show evidence of infection with both MRSA as well as pseudomonas based on that result. Nonetheless again with his other current medications we are not able to do the Cipro due to issues with potential long QT syndrome. I am going to give him a prescription for doxycycline in order to help with the potential MRSA infection. 07/09/18 on evaluation today patient actually appears to be doing about the same in regard to his sacral wound. He actually has his MRI scheduled for tomorrow and then subsequently is going to be having his infectious disease appointment for Thursday of this week. Fortunately he's not having any significant discomfort he still has your theme in the sacral region he still has several blister/flux went areas although they technically are not blisters this is more like a underlying abscess. The one area that is open still does probe down to bone. Again I am concerned about a deeper infection possibly even sacral osteomyelitis. 07/23/18 on evaluation today patient actually appears to be doing very well all things considered in regard to his sacral ulcer. Since I've last seen him he actually did have his MRI performed which showed that  he has a complex fluid collection superficial to the distal sacrum measuring 5.9 x 4.4 x 2.8 cm which abuts the posterior aspect of the distal sacrum and the sacrum itself shows cortical destruction consistent with osteomyelitis. She has also been seen by infectious disease and currently orders have been initiated for IV antibiotic therapy for the next eight weeks. He was seen by Janene Madeira NP and placed on Ceftazidime and Daptomycin. Currently he has not really been on this quite long enough to see a sufficient response to the new orders as far as antibiotic therapy is concerned. Nonetheless it does appear that he is likely on the right track at this point which is great news. Nonetheless the question which both Colletta Maryland and myself have discussed both with the patient and between  ourselves is whether or not the patient may need to be seen by surgeon for surgical evacuation of the fluid collection/abscess and possible debridement of the sacrum itself. Nonetheless at this time my personal opinion is probably gonna be that we wait and get this at least a couple weeks to see the response that he receives with the IV antibiotic therapy. 08/06/18 on evaluation today patient actually appears to be doing rather well in regard to his sacral ulcer region all things considered. He has been tolerating the dressing changes without complication. With that being said there is not any obvious opening nor any drainage noted at this point in time upon evaluation. The patient has been tolerating the dressing changes though. He's doing well with the IV antibiotic therapy. 08/20/18 on evaluation today patient appears to be doing wonderful in regard to his sacral region. In fact there appears to be no wound opening or drainage at this time. Overall I'm very pleased with how things have progressed. The patient likewise as well as his wife and daughter are very happy as well. Readmission: 11/20/18 on evaluation today  patient presents for reevaluation our clinic concerning issues with his sacral region. Unfortunately the area in question is the same region which we previously treated what we thought would successfully back in November 2019. At that time the patient underwent IV antibiotic therapy which seemed to do the job very well. Subsequently however in the past couple of weeks he has begun to have drainage and bleeding from the sacral region and is having increased pain yet again. Fortunately there is no signs of systemic infection although it does appear that the complex abscess that was previously noted may not have fully cleared there was a question between myself as well is infectious disease previous whether not he needed to see a surgeon being that things got better the family opted not to see a surgeon at that time. Nonetheless I am concerned that he may the need to see a surgeon in order to have this area surgically debrided and possibly a bone culture obtained in order to get a better idea of what we're treating and ensure that this is able to completely and fully heal. 11/27/18 on evaluation today patient appears to be doing about the same in regard to his sacral ulcer. He did see Dr. Lysle Pearl yesterday and the plan is to proceed forward with surgery to clean out the region. This seems to be the most appropriate goal of treatment at this point. Dr. Lysle Pearl just needed to speak with Dr. Ellene Route the patient's neurologist in order to get clearance as the patient was supposed to be scheduled for a carpal tunnel and elbow surgery with Dr. Ellene Route. Nonetheless he has apparently given clearance to proceed with this surgery for the sacral region which is deemed more important at this point. Unfortunately the patient had a little bit of increased pain although he is having redness at this point there does not appear to be any spreading infection which is good news. No fevers, chills, nausea, or vomiting noted at  this time. [name], [name] ([id number]) 12/03/18 on evaluation today patient actually appears to be doing about the same in regard to the sacral ulcer. He still waiting to hear back from Dr. Ines Bloomer office in regard to scheduling his surgery. Fortunately there's no signs of systemic infection at this time which is good news. Unfortunately he really does not seem to making any progress the doxycycline does seem to at least  be beneficial in helping to hold off the infection from worsening although unfortunately he still has a lot going on in this regard. Patient's daughter states she's actually gonna contact her office tomorrow to see what exactly is going on. 12/10/18 on evaluation today patient actually appears to be doing about the same in regard to her sacral room. Apparently he still waiting on approval from both cardiology as well as Dr. Ellene Route although apparently he's Artie gotten a formal letter from Dr. Ellene Route stated that he was clear to proceed with the sacral surgery. Di Kindle actually found the clearance letter from cardiology in epic as well today and this subsequently was given to the patient's daughter as that seems to be the only thing that was holding up proceeding with the surgery. Hopefully should be able to take that over to the surgeon's office today in order to go ahead and get this scheduled. 12/24/18 on evaluation today patient actually appears to be doing very well in regard to his sacral ulcer compared to when I last saw him. He has had surgery Dr. Lysle Pearl and it does appear that he was able to clear out this area of abscess very well without complication. This did extend down to the bone and a portion of the bone was sent for pathology and culture. Although on the pathology report it appears this is more tissue and not bone noted. With that being said the culture from the bone and Henrene Pastor asked him this was obtained revealed Corynebacterium striatum with no anaerobes isolated. Dr.  Lysle Pearl did not place the patient on the antibiotics at this point. Again I have been debating on whether or not this is the patient to infectious disease I think that I am going to do that at this point to gain their opinion on whether or not there's anything we should be treated in this regard and if so will be the best treatment options. 12/31/18 on evaluation today patient actually appears to be doing okay in regard to his sacral wound. Fortunately there does not appear to be any evidence of infection at this time which is good news. He has been tolerating the dressing's without complication. With that being said he did see infectious disease and apparently they realize that the pathology samples sent from the operating room was actually. I'll see him and not bone and the question is whether the culture and sample or just contamination from the skin of not guilty and active infection. The daughter is very concerned about this she states that we may need to get a piece of bone to send for examination to ensure there's nothing more severe going on here. Obviously with this history of healing and then obsesses forming and then having to go for surgery now and reopening everything they have a reason to be concerned I completely agree. 01/07/19 on evaluation today patient actually appears to be doing well in regard to his sacral wound. Again we did get results back of his bone culture as well as the pathology which showed no evidence of osteomyelitis at this point this is excellent news. Overall I think that he likely does not need to go forward with the MRI since everything seems to be showing negative at this time. Especially in light of the fact that the wound appears to be doing well and that the infection and everything was running the wound bed seems to be dramatically improved. Patient's daughter is in agreement with this plan. 01/14/19 on evaluation today patient  appears to be doing rather well at  this time as far as the overall appearance of the sacral wound is concerned. The depth is slightly improved he has some granulation tissue noted there still is bone in the very bottom of the wound bed although I'm no longer able to palpate this with my finger I can only tell by probing with the sterile cotton swab. Nonetheless I do believe this is shown signs of good improvement in healing as far as what I'm seeing at this point. 01/22/19 on evaluation today patient's wound in the sacral region appears to be doing rather well. Fortunately there does not seem to be any evidence of active infection at this time which is good news but patient overall has been doing excellent currently. I'm very pleased with how things have progressed over the past week. 01/28/19 on evaluation today patient actually appears to be doing very well in regard to his sacral wound. The area where the bone was exposed is closing in on becoming much more solid which is excellent news there does not appear to be any signs of active infection at this time also good news. Overall very pleased with how everything seems to be progressing. 02/04/19 on evaluation today patient appears to be doing much better in regard to the sacral wound. He has been tolerating the dressing changes without complication and though he is making some progress there's actually some hyper granulation noted at this point. I do think he may benefit from a dressing change we been using the Prisma with the alginate pack behind for some time I think we may want to switch to [facility] Dressing to see if this could be of benefit. 02/11/19 on evaluation today patient appears to be doing well in regard to his sacral wound. There does not appear to be any signs of active infection which is good news. There is some hyper granular tissue which we are attempting to address little by little with the [facility] Dressing although I do believe that many to try silver  nitrate today as well. [name], [name] ([id number]) 02/18/19 on evaluation today patient appears to be doing well in regard to his sacral ulcer. He does still have the small hole which goes down deeper but I do need to work on cleaning out today. Nonetheless other than this he seems to be healing quite nicely and overall very pleased in this regard. I'm just having difficulty getting this region in particular to fill in as quickly and well as I would like. 02/25/19 on evaluation today patient's wound bed actually showed signs of good improvement at this point. Fortunately there's no evidence of active infection currently which is great news. Overall been very pleased with how things seem to be progressing. With that being said patient is having very minimal discomfort and again no signs of obvious and active infection at this time. 03/04/19 on evaluation today patient's wound in the sacral region actually appears to be doing quite well in my pinion. He has just a very small opening remaining at this point. Fortunately there's no signs of infection and there is really no significant Slough buildup. I believe he is very close to complete closure. 03/18/19 on evaluation today patient actually appears to be doing quite well in regard to his sacral wound. In fact this appears to be completely healed there's no signs of infection and overall I think he is doing extremely well. Overall I think that he is ready for discharge  as of today. Electronic Signature(s) Signed: 03/18/2019 3:03:34 PM By: Worthy Keeler PA-C Entered By: Worthy Keeler on 03/18/2019 15:01:56 [name] ([id number]) -------------------------------------------------------------------------------- Physical Exam Details Patient Name: KENNER, LEWAN Date of Service: 03/18/2019 1:45 PM Medical Record Number: [id number] Patient Account Number: [id number] Date of Birth/Sex: 01/17/1931 (85 y.o. M) Treating RN: Harold Barban Primary  Care Provider: Burman Freestone Other Clinician: Referring Provider: Burman Freestone Treating Provider/Extender: STONE III, HOYT Weeks in Treatment: 64 Constitutional Well-nourished and well-hydrated in no acute distress. Respiratory normal breathing without difficulty. Psychiatric this patient is able to make decisions and demonstrates good insight into disease process. Alert and Oriented x 3. pleasant and cooperative. Notes Currently patient's wound shows complete epithelialization everything appears to be doing very well. Electronic Signature(s) Signed: 03/18/2019 3:03:34 PM By: Worthy Keeler PA-C Entered By: Worthy Keeler on 03/18/2019 15:02:29 [name] ([id number]) -------------------------------------------------------------------------------- Physician Orders Details Patient Name: [name], [name] Date of Service: 03/18/2019 1:45 PM Medical Record Number: [id number] Patient Account Number: [id number] Date of Birth/Sex: 10-05-1931 (85 y.o. M) Treating RN: Harold Barban Primary Care Provider: Burman Freestone Other Clinician: Referring Provider: Burman Freestone Treating Provider/Extender: [name], HOYT Weeks in Treatment: 16 Verbal / Phone Orders: No Diagnosis Coding ICD-10 Coding Code Description L89.154 Pressure ulcer of sacral region, stage 4 L02.31 Cutaneous abscess of buttock Discharge From [facility] o Discharge from [place] Signature(s) Signed: 03/18/2019 3:03:34 PM By: Worthy Keeler PA-C Signed: 03/18/2019 3:11:16 PM By: Harold Barban Entered By: Harold Barban on 03/18/2019 14:48:08 [name] ([id number]) -------------------------------------------------------------------------------- Problem List Details Patient Name: [name], [name]. Date of Service: 03/18/2019 1:45 PM Medical Record Number: [id number] Patient Account Number: [id number] Date of Birth/Sex: Mar 15, 1931 (85 y.o. M) Treating RN: Harold Barban Primary Care Provider: Burman Freestone Other Clinician: Referring Provider: Burman Freestone Treating Provider/Extender: [name], HOYT Weeks in Treatment: 16 Active Problems ICD-10 Evaluated Encounter Code Description Active Date Today Diagnosis L89.154 Pressure ulcer of sacral region, stage 4 11/20/2018 No Yes L02.31 Cutaneous abscess of buttock 11/20/2018 No Yes Inactive Problems Resolved Problems Electronic Signature(s) Signed: 03/18/2019 3:03:34 PM By: Worthy Keeler PA-C Entered By: Worthy Keeler on 03/18/2019 14:23:41 [name] ([id number]) -------------------------------------------------------------------------------- Progress Note Details Patient Name: [name] Date of Service: 03/18/2019 1:45 PM Medical Record Number: [id number] Patient Account Number: [id number] Date of Birth/Sex: [birth year] (85 y.o. M) Treating RN: Harold Barban Primary Care Provider: Burman Freestone Other Clinician: Referring Provider: Burman Freestone Treating Provider/Extender: [name], HOYT Weeks in Treatment: 16 Subjective Chief Complaint Information obtained from Patient Sacral pressure ulcer History of Present Illness (HPI) 02/06/18 on evaluation today patient presents for initial evaluation and our clinic concerning an issue which began roughly 3 weeks ago when the patient fell in his home on the floor in his kitchen and laid him down this detergent for roughly 3 days. He had a pressure injury to the left shoulder. This unfortunately has caused him a lot of discomfort although it finally seems to be doing better if anything is really having a lot of itching right now. This appears potentially be a contact dermatitis issue. He also has a significant pressure injury to the sacrum at this time as well which is also showing fascia exposure right over the bone but no evidence of bone exposure at this point which is good news. They have been using Santyl as well as  Saline soaked gauze at this point in time. There does appear to  be a lot of necrotic slough in the base of the wound. He does have a history of incontinence, myocardial infarction, and hypertension. He also is "borderline diabetic" hemoglobin A1c of 6.0. Currently he has some discomfort in the pressure site at the sacrum but fortunately nothing too significant this did require sharp debridement today. 02/13/18 on evaluation today patient appears to be doing much better in regard to his sacral wound. He has been tolerating the dressing changes without complication with the Vashe. Fortunately there is no evidence of infection and though there is some Slough on the surface of the wound bed he has excellent granulation noted. Overall I'm pleased with how things have progressed in that regard. A glance at his shoulder as well and the rash seems to be someone improving in my pinion at this site as well. Overall I am pleased with what we're seeing and so is the family. 02/20/18 on evaluation today patient appears to be doing a little worse in regard to the sacral wound only in the fact that there is redness surrounding it has me somewhat concerned for infection. The drainage has also apparently been a little bit off color compared to normal according to family they did keep the dressing today that was removed to show me and I agree this seems to be a little bit different compared to what we have been seeing. Coupled with the redness I'm concerned he may be developing some cellulitis surrounding the wound bed. 02/27/18 on evaluation today patient presents for follow-up concerning his sacral ulcer. We have received the results back from his wound culture which shows unfortunately that the doxycycline will not be of benefit for him I am going to need to initiate treatment with something else in order to treat the pseudomonas. Otherwise he does not seem to be having any significant pain although his daughter  states there are sometimes when he states having pain. We continue to use the Vashe currently. 03/06/18 on evaluation today patient's sacral wound appears to be doing better in my opinion. He has been tolerating the dressing changes without complication. With that being said the silver nitrate has helped with the prominent area of hyper granulation at the 6 o'clock location we will likely need to repeat this again today. Nonetheless overall I am pleased with how things have improved over the last week. The erythema surrounding the wound seems to be greatly improved. 03/13/18 on evaluation today patient's wound actually does not appear to be terribly infected although she does continue to have erythema surrounding the wound bed especially on the left border. I'm still somewhat concerned about the fact that the oral antibiotics alone may not be completely treating his infection. I previously discussed may need to go for IV antibiotic therapy I'm concerned that may be the case. We will need to make a referral today for infectious disease. 03/20/18 on evaluation today the patient sacral wound actually appears to be doing fairly well in regard to granulation although he continues unfortunately to have it your theme is surrounding the periwound region. There's also some increased swelling at the 6 o'clock location which also has me somewhat concerned. With that being said he does have some discomfort but [name], [name]. ([id number]) nothing too significant at this point. He still has not heard from infectious disease his daughter and wife are both present during the office visit today they're going to check back with this again. We did get the information for them to call them today.  03/27/18 on evaluation today patient is seen concerning his ongoing sacral ulcer. He has been tolerating the dressing changes without complication. With that being said he does present with evidence of bright green drainage noted  on the dressing which again is something that I do often expect to see with a pseudomonas infection. He continues to have your theme is surrounding the wound bed as well and again I'm not 100% convinced this is just pressure related. I did speak with Colletta Maryland who is the nurse practitioner in [place] with infectious disease. I spoke with her actually yesterday concerning this patient. She is not 100% convinced that this is infected. She question whether the wound may just be colonized with Pseudomonas and not actually causing an active infection. I am really not thinking that the edema is associated with pressure alone and again overall I don't feel that your theme and is consistent with a pressure injury either as he's never had any contusions noted like a deep tissue injury on his heel which was new and I did visualize today this was on the left heel. Nonetheless she wanted to give this a little bit more time and thought it would be appropriate to start the Wound VAC at this point. 04/03/18 on evaluation today patient sacral ulcer actually appears to be doing fairly well at this point. He has been tolerating the dressing changes without complication. With that being said I'm very pleased with the progress that has been made in regard to his sacral wound over the past week I do not see as much in the way of erythema which is great news. Nonetheless he does have a small area of hyper granulation unfortunately. This is at roughly the 7 o'clock location and I think does need to be addressed so that this will heal more appropriately. Nonetheless I think we may be ready to go ahead and initiate therapy with the Wound VAC. 04/10/18 on evaluation today patient appears to be doing excellent in regard to his sacral ulcer. The show signs of great improvement in overall I'm very pleased with how things look. He has been tolerating the dressing changes without complication. Specifically this is the Wound  VAC. He also seems to be doing well with the antibiotic there is decreased your theme and redness surrounding the sacral area at this point in the wound has filled in quite significantly. 04/17/18 on evaluation today patient actually appears to be doing excellent in regard to his sacral ulcer. He's been tolerating the dressing changes without complication specifically the Wound VAC. There really are no major concerns from the patient nor family this point he is having no pain. He does have a little bit of Epiboly on the lateral portions of the wound where he does have a little bit more depth that will need to be addressed today. 04/23/18 on evaluation today patient's wound actually appears to be doing excellent at this point. He has been tolerating the Wound VAC and this appears to be doing well other than the fact that it seems to be breaking seal at the 6 o'clock location. I do believe that adding a duodenum dressing at this location try and help maintain the seal would be appropriate and likely very effective. With that being said he overall seems to be showing signs of good improvement at this point. There does not appear to be any evidence of significant infection which is also excellent news. 04/30/18 on evaluation today patient actually appears to be doing well in  regard to his sacral ulcer. He's been tolerating the dressing changes without complication. Fortunately there does not appear to be any evidence of infection. Overall I'm very pleased with the progress that has been made up to this point. He does have some blistering underneath the draping unfortunately although this is definitely something that has been noted on other patients previously is a fairly common occurrence. Nonetheless the patient seems to be doing fairly well in general in my pinion based on what I see at this time. I do believe these are fairly superficial and minor. 05/07/18 on evaluation today patient's wound actually  appears to be doing excellent at this point in time. He has been tolerating the Wound VAC decently well he states that it is somewhat cumbersome to carry around unfortunately. The only other issue he's been having according to family is that they been having a difficult time keeping the Wound VAC in place and doing what is supposed to do without making. Obviously I do think that this is definitely of concern. Nonetheless I do believe she's made good progress up to this point. I'm very happy in that regard. 05/14/18 on evaluation today patient's wound continues to make good progress at this point. He had a minimal amount of slough noted on the surface which was easily wiped away with saline and gauze and in general I feel like that he is continuing to show excellent progress even with the discontinuation of the Wound VAC. Overall I'm pleased in this regard. He was having issues with the Wound VAC in getting it to seal I think that using the Prisma at this time has been equally efficient and getting the wound to diminish in size. 05/29/18- He is here in follow up evaluation for a sacral ulcer. There is improvement, we will continue with prisma and he will follow up in two weeks [name], [name] ([id number]) 06/11/18 on evaluation today patient had unfortunately bright green drainage on the dressing upon evaluation today. This is something that we have encountered before although we were able to get things under control previously with antibiotics. With that being said currently upon further inspection of the three areas of hyper granulation that were separate from the actual wound itself which was almost healed it really appears that these all have some depth to them. They are more tunnels that really have not closed or at least have reopened as a result likely of infection in my pinion. This is definitely not what I was expecting or hoping for. 06/26/18 on evaluation today patient continues to  experience issues with what appears to be small abscesses in the sacral region unfortunately. With that being said he has been tolerating the dressing changes without complication which is good news. He's not having any significant discomfort which is also good news. With that being said his daughter states that after he left last week that the packing that we have placed fell out quite rapidly and he subsequently healed over very quick to the point they were not able to even repack the regions. Nonetheless there appear to be several fluctuance areas noted at this point there's one central region that does seem to be draining still discharge that is somewhat green in color. I did review the results of the wound culture which did show evidence of infection with both MRSA as well as pseudomonas based on that result. Nonetheless again with his other current medications we are not able to do the Cipro due to issues with  potential long QT syndrome. I am going to give him a prescription for doxycycline in order to help with the potential MRSA infection. 07/09/18 on evaluation today patient actually appears to be doing about the same in regard to his sacral wound. He actually has his MRI scheduled for tomorrow and then subsequently is going to be having his infectious disease appointment for Thursday of this week. Fortunately he's not having any significant discomfort he still has your theme in the sacral region he still has several blister/flux went areas although they technically are not blisters this is more like a underlying abscess. The one area that is open still does probe down to bone. Again I am concerned about a deeper infection possibly even sacral osteomyelitis. 07/23/18 on evaluation today patient actually appears to be doing very well all things considered in regard to his sacral ulcer. Since I've last seen him he actually did have his MRI performed which showed that he has a complex fluid  collection superficial to the distal sacrum measuring 5.9 x 4.4 x 2.8 cm which abuts the posterior aspect of the distal sacrum and the sacrum itself shows cortical destruction consistent with osteomyelitis. She has also been seen by infectious disease and currently orders have been initiated for IV antibiotic therapy for the next eight weeks. He was seen by Janene Madeira NP and placed on Ceftazidime and Daptomycin. Currently he has not really been on this quite long enough to see a sufficient response to the new orders as far as antibiotic therapy is concerned. Nonetheless it does appear that he is likely on the right track at this point which is great news. Nonetheless the question which both Colletta Maryland and myself have discussed both with the patient and between ourselves is whether or not the patient may need to be seen by surgeon for surgical evacuation of the fluid collection/abscess and possible debridement of the sacrum itself. Nonetheless at this time my personal opinion is probably gonna be that we wait and get this at least a couple weeks to see the response that he receives with the IV antibiotic therapy. 08/06/18 on evaluation today patient actually appears to be doing rather well in regard to his sacral ulcer region all things considered. He has been tolerating the dressing changes without complication. With that being said there is not any obvious opening nor any drainage noted at this point in time upon evaluation. The patient has been tolerating the dressing changes though. He's doing well with the IV antibiotic therapy. 08/20/18 on evaluation today patient appears to be doing wonderful in regard to his sacral region. In fact there appears to be no wound opening or drainage at this time. Overall I'm very pleased with how things have progressed. The patient likewise as well as his wife and daughter are very happy as well. Readmission: 11/20/18 on evaluation today patient presents for  reevaluation our clinic concerning issues with his sacral region. Unfortunately the area in question is the same region which we previously treated what we thought would successfully back in November 2019. At that time the patient underwent IV antibiotic therapy which seemed to do the job very well. Subsequently however in the past couple of weeks he has begun to have drainage and bleeding from the sacral region and is having increased pain yet again. Fortunately there is no signs of systemic infection although it does appear that the complex abscess that was previously noted may not have fully cleared there was a question between myself as  well is infectious disease previous whether not he needed to see a surgeon being that things got better the family opted not to see a surgeon at that time. Nonetheless I am concerned that he may the need to see a surgeon in order to have this area surgically debrided and possibly a bone culture obtained in order to get a better idea of what we're treating and ensure that this is able to completely and fully heal. 11/27/18 on evaluation today patient appears to be doing about the same in regard to his sacral ulcer. He did see Dr. Viviano Simas, [name] ([id number]) yesterday and the plan is to proceed forward with surgery to clean out the region. This seems to be the most appropriate goal of treatment at this point. Dr. Lysle Pearl just needed to speak with Dr. Ellene Route the patient's neurologist in order to get clearance as the patient was supposed to be scheduled for a carpal tunnel and elbow surgery with Dr. Ellene Route. Nonetheless he has apparently given clearance to proceed with this surgery for the sacral region which is deemed more important at this point. Unfortunately the patient had a little bit of increased pain although he is having redness at this point there does not appear to be any spreading infection which is good news. No fevers, chills, nausea, or vomiting  noted at this time. 12/03/18 on evaluation today patient actually appears to be doing about the same in regard to the sacral ulcer. He still waiting to hear back from Dr. Ines Bloomer office in regard to scheduling his surgery. Fortunately there's no signs of systemic infection at this time which is good news. Unfortunately he really does not seem to making any progress the doxycycline does seem to at least be beneficial in helping to hold off the infection from worsening although unfortunately he still has a lot going on in this regard. Patient's daughter states she's actually gonna contact her office tomorrow to see what exactly is going on. 12/10/18 on evaluation today patient actually appears to be doing about the same in regard to her sacral room. Apparently he still waiting on approval from both cardiology as well as Dr. Ellene Route although apparently he's Artie gotten a formal letter from Dr. Ellene Route stated that he was clear to proceed with the sacral surgery. Di Kindle actually found the clearance letter from cardiology in epic as well today and this subsequently was given to the patient's daughter as that seems to be the only thing that was holding up proceeding with the surgery. Hopefully should be able to take that over to the surgeon's office today in order to go ahead and get this scheduled. 12/24/18 on evaluation today patient actually appears to be doing very well in regard to his sacral ulcer compared to when I last saw him. He has had surgery Dr. Lysle Pearl and it does appear that he was able to clear out this area of abscess very well without complication. This did extend down to the bone and a portion of the bone was sent for pathology and culture. Although on the pathology report it appears this is more tissue and not bone noted. With that being said the culture from the bone and Henrene Pastor asked him this was obtained revealed Corynebacterium striatum with no anaerobes isolated. Dr. Lysle Pearl did not place  the patient on the antibiotics at this point. Again I have been debating on whether or not this is the patient to infectious disease I think that I am going to do that  at this point to gain their opinion on whether or not there's anything we should be treated in this regard and if so will be the best treatment options. 12/31/18 on evaluation today patient actually appears to be doing okay in regard to his sacral wound. Fortunately there does not appear to be any evidence of infection at this time which is good news. He has been tolerating the dressing's without complication. With that being said he did see infectious disease and apparently they realize that the pathology samples sent from the operating room was actually. I'll see him and not bone and the question is whether the culture and sample or just contamination from the skin of not guilty and active infection. The daughter is very concerned about this she states that we may need to get a piece of bone to send for examination to ensure there's nothing more severe going on here. Obviously with this history of healing and then obsesses forming and then having to go for surgery now and reopening everything they have a reason to be concerned I completely agree. 01/07/19 on evaluation today patient actually appears to be doing well in regard to his sacral wound. Again we did get results back of his bone culture as well as the pathology which showed no evidence of osteomyelitis at this point this is excellent news. Overall I think that he likely does not need to go forward with the MRI since everything seems to be showing negative at this time. Especially in light of the fact that the wound appears to be doing well and that the infection and everything was running the wound bed seems to be dramatically improved. Patient's daughter is in agreement with this plan. 01/14/19 on evaluation today patient appears to be doing rather well at this time as far as  the overall appearance of the sacral wound is concerned. The depth is slightly improved he has some granulation tissue noted there still is bone in the very bottom of the wound bed although I'm no longer able to palpate this with my finger I can only tell by probing with the sterile cotton swab. Nonetheless I do believe this is shown signs of good improvement in healing as far as what I'm seeing at this point. 01/22/19 on evaluation today patient's wound in the sacral region appears to be doing rather well. Fortunately there does not seem to be any evidence of active infection at this time which is good news but patient overall has been doing excellent currently. I'm very pleased with how things have progressed over the past week. 01/28/19 on evaluation today patient actually appears to be doing very well in regard to his sacral wound. The area where the bone was exposed is closing in on becoming much more solid which is excellent news there does not appear to be any signs of active infection at this time also good news. Overall very pleased with how everything seems to be progressing. 02/04/19 on evaluation today patient appears to be doing much better in regard to the sacral wound. He has been tolerating the dressing changes without complication and though he is making some progress there's actually some hyper granulation noted at this point. I do think he may benefit from a dressing change we been using the Prisma with the alginate pack behind [name], [name]. ([id number]) for some time I think we may want to switch to [facility] Dressing to see if this could be of benefit. 02/11/19 on evaluation today patient  appears to be doing well in regard to his sacral wound. There does not appear to be any signs of active infection which is good news. There is some hyper granular tissue which we are attempting to address little by little with the [facility] Dressing although I do believe that many to  try silver nitrate today as well. 02/18/19 on evaluation today patient appears to be doing well in regard to his sacral ulcer. He does still have the small hole which goes down deeper but I do need to work on cleaning out today. Nonetheless other than this he seems to be healing quite nicely and overall very pleased in this regard. I'm just having difficulty getting this region in particular to fill in as quickly and well as I would like. 02/25/19 on evaluation today patient's wound bed actually showed signs of good improvement at this point. Fortunately there's no evidence of active infection currently which is great news. Overall been very pleased with how things seem to be progressing. With that being said patient is having very minimal discomfort and again no signs of obvious and active infection at this time. 03/04/19 on evaluation today patient's wound in the sacral region actually appears to be doing quite well in my pinion. He has just a very small opening remaining at this point. Fortunately there's no signs of infection and there is really no significant Slough buildup. I believe he is very close to complete closure. 03/18/19 on evaluation today patient actually appears to be doing quite well in regard to his sacral wound. In fact this appears to be completely healed there's no signs of infection and overall I think he is doing extremely well. Overall I think that he is ready for discharge as of today. Patient History Information obtained from Patient. Family History Cancer - Paternal Grandparents, Diabetes - Father, Heart Disease - Mother,Father, Stroke - Father, No family history of Hypertension, Kidney Disease, Lung Disease, Seizures, Thyroid Problems, Tuberculosis. Social History Never smoker, Marital Status - Widowed, Alcohol Use - Never, Drug Use - No History, Caffeine Use - Daily. Medical History Eyes Patient has history of Cataracts - bilateral removal Denies history of Glaucoma,  Optic Neuritis Ear/Nose/Mouth/Throat Denies history of Chronic sinus problems/congestion, Middle ear problems Hematologic/Lymphatic Denies history of Anemia, Hemophilia, Human Immunodeficiency Virus, Lymphedema, Sickle Cell Disease Respiratory Patient has history of Asthma Denies history of Aspiration, Chronic Obstructive Pulmonary Disease (COPD), Pneumothorax, Sleep Apnea, Tuberculosis Cardiovascular Patient has history of Angina, Arrhythmia, Coronary Artery Disease, Hypertension, Myocardial Infarction - 2001 Denies history of Congestive Heart Failure, Deep Vein Thrombosis, Hypotension, Peripheral Arterial Disease, Peripheral Venous Disease, Phlebitis, Vasculitis Gastrointestinal Denies history of Cirrhosis , Colitis, Crohn s, Hepatitis A, Hepatitis B, Hepatitis C Endocrine Denies history of Type I Diabetes, Type II Diabetes Genitourinary Denies history of End Stage Renal Disease Immunological Denies history of Lupus Erythematosus, Raynaud s, Scleroderma Integumentary (Skin) Denies history of History of Burn, History of pressure wounds ACY, ORSAK ([id number]) Musculoskeletal Patient has history of Osteoarthritis Denies history of Gout, Rheumatoid Arthritis, Osteomyelitis Neurologic Patient has history of Dementia, Neuropathy Denies history of Quadriplegia, Paraplegia, Seizure Disorder Oncologic Denies history of Received Chemotherapy, Received Radiation Psychiatric Denies history of Anorexia/bulimia, Confinement Anxiety Hospitalization/Surgery History - Fall. Medical And Surgical History Notes Endocrine Borderline Oncologic Melanoma on back Review of Systems (ROS) Constitutional Symptoms (General Health) Denies complaints or symptoms of Fatigue, Fever, Chills, Marked Weight Change. Respiratory Denies complaints or symptoms of Chronic or frequent coughs, Shortness of Breath. Cardiovascular Denies  complaints or symptoms of Chest pain, LE edema. Psychiatric Denies  complaints or symptoms of Anxiety, Claustrophobia. Objective Constitutional Well-nourished and well-hydrated in no acute distress. Vitals Time Taken: 2:33 PM, Height: 69 in, Weight: 170.6 lbs, BMI: 25.2, Temperature: 98.4 F, Pulse: 60 bpm, Respiratory Rate: 16 breaths/min, Blood Pressure: 173/69 mmHg. Respiratory normal breathing without difficulty. Psychiatric this patient is able to make decisions and demonstrates good insight into disease process. Alert and Oriented x 3. pleasant and cooperative. General Notes: Currently patient's wound shows complete epithelialization everything appears to be doing very well. Integumentary (Hair, Skin) Wound #2 status is Healed - Epithelialized. Original cause of wound was Gradually Appeared. The wound is located on the Midline Sacrum. The wound measures 0cm length x 0cm width x 0cm depth; 0cm^2 area and 0cm^3 volume. There is no ESIAS, MORY ([id number]) tunneling or undermining noted. There is a none present amount of drainage noted. The wound margin is indistinct and nonvisible. There is medium (34-66%) pale, hyper - granulation within the wound bed. There is a small (1-33%) amount of necrotic tissue within the wound bed including Adherent Slough. Assessment Active Problems ICD-10 Pressure ulcer of sacral region, stage 4 Cutaneous abscess of buttock Plan Discharge From [facility] Services: Discharge from [place] At this point we're gonna discontinue wound care services since the patient appears to be doing well he's in agreement that plan. If anything changes or worsens she let me know and his family was advised of this today both his wife and his daughter were present during the evaluation at this point. If anything changes in the meantime they will contact the office and let me know otherwise I hope that he continues and maintains wound free at this site. Electronic Signature(s) Signed: 03/18/2019 3:03:34 PM By: Worthy Keeler  PA-C Entered By: Worthy Keeler on 03/18/2019 15:02:47 [name] ([id number]) -------------------------------------------------------------------------------- ROS/PFSH Details Patient Name: [name] Date of Service: 03/18/2019 1:45 PM Medical Record Number: [id number] Patient Account Number: [id number] Date of Birth/Sex: [birth year] (85 y.o. M) Treating RN: Harold Barban Primary Care Provider: Burman Freestone Other Clinician: Referring Provider: Burman Freestone Treating Provider/Extender: [name], HOYT Weeks in Treatment: 16 Information Obtained From Patient Constitutional Symptoms (General Health) Complaints and Symptoms: Negative for: Fatigue; Fever; Chills; Marked Weight Change Respiratory Complaints and Symptoms: Negative for: Chronic or frequent coughs; Shortness of Breath Medical History: Positive for: Asthma Negative for: Aspiration; Chronic Obstructive Pulmonary Disease (COPD); Pneumothorax; Sleep Apnea; Tuberculosis Cardiovascular Complaints and Symptoms: Negative for: Chest pain; LE edema Medical History: Positive for: Angina; Arrhythmia; Coronary Artery Disease; Hypertension; Myocardial Infarction - 2001 Negative for: Congestive Heart Failure; Deep Vein Thrombosis; Hypotension; Peripheral Arterial Disease; Peripheral Venous Disease; Phlebitis; Vasculitis Psychiatric Complaints and Symptoms: Negative for: Anxiety; Claustrophobia Medical History: Negative for: Anorexia/bulimia; Confinement Anxiety Eyes Medical History: Positive for: Cataracts - bilateral removal Negative for: Glaucoma; Optic Neuritis Ear/Nose/Mouth/Throat Medical History: Negative for: Chronic sinus problems/congestion; Middle ear problems Hematologic/Lymphatic Medical History: Negative for: Anemia; Hemophilia; Human Immunodeficiency Virus; Lymphedema; Sickle Cell Disease [name], [name] ([id number]) Gastrointestinal Medical History: Negative for: Cirrhosis ; Colitis;  Crohnos; Hepatitis A; Hepatitis B; Hepatitis C Endocrine Medical History: Negative for: Type I Diabetes; Type II Diabetes Past Medical History Notes: Borderline Genitourinary Medical History: Negative for: End Stage Renal Disease Immunological Medical History: Negative for: Lupus Erythematosus; Raynaudos; Scleroderma Integumentary (Skin) Medical History: Negative for: History of Burn; History of pressure wounds Musculoskeletal Medical History: Positive for: Osteoarthritis Negative for: Gout; Rheumatoid Arthritis; Osteomyelitis Neurologic Medical  History: Positive for: Dementia; Neuropathy Negative for: Quadriplegia; Paraplegia; Seizure Disorder Oncologic Medical History: Negative for: Received Chemotherapy; Received Radiation Past Medical History Notes: Melanoma on back HBO Extended History Items Eyes: Cataracts Immunizations Pneumococcal Vaccine: Received Pneumococcal Vaccination: Yes Implantable Devices No devices added Hospitalization / Surgery History Type of Hospitalization/Surgery [name], [name] ([id number]) Fall Family and Social History Cancer: Yes - Paternal Grandparents; Diabetes: Yes - Father; Heart Disease: Yes - Mother,Father; Hypertension: No; Kidney Disease: No; Lung Disease: No; Seizures: No; Stroke: Yes - Father; Thyroid Problems: No; Tuberculosis: No; Never smoker; Marital Status - Widowed; Alcohol Use: Never; Drug Use: No History; Caffeine Use: Daily; Financial Concerns: No; Food, Clothing or Shelter Needs: No; Support System Lacking: No; Transportation Concerns: No Physician Affirmation I have reviewed and agree with the above information. Electronic Signature(s) Signed: 03/18/2019 3:03:34 PM By: Worthy Keeler PA-C Signed: 03/18/2019 3:11:16 PM By: Harold Barban Entered By: Worthy Keeler on 03/18/2019 15:02:16 [name] ([id number]) -------------------------------------------------------------------------------- SuperBill  Details Patient Name: [name] Date of Service: 03/18/2019 Medical Record Number: [id number] Patient Account Number: [id number] Date of Birth/Sex: 01-08-1931 (86 y.o. M) Treating RN: Harold Barban Primary Care Provider: Burman Freestone Other Clinician: Referring Provider: Burman Freestone Treating Provider/Extender: [name], HOYT Weeks in Treatment: 16 Diagnosis Coding ICD-10 Codes Code Description L89.154 Pressure ulcer of sacral region, stage 4 L02.31 Cutaneous abscess of buttock Facility Procedures CPT4 Code: [id number] Description: [phone] - WOUND CARE VISIT-LEV 2 EST PT Modifier: Quantity: 1 Physician Procedures CPT4 Code: [id number] Description: 40102 - WC PHYS LEVEL 2 - EST PT ICD-10 Diagnosis Description L89.154 Pressure ulcer of sacral region, stage 4 L02.31 Cutaneous abscess of buttock Modifier: Quantity: 1 Electronic Signature(s) Signed: 03/18/2019 3:03:34 PM By: Worthy Keeler PA-C Entered By: Worthy Keeler on 03/18/2019 15:02:57

## 2019-04-08 IMAGING — MR MR PELVIS W/O CM
6 series · 46 of 48 positions shown · non-contrast
Comparison: Radiographs 02/06/2018.  Pelvic CT 01/09/2012.

CLINICAL DATA: Sacral stage IV pressure wound for 6 months.

EXAM:
MRI PELVIS WITHOUT CONTRAST
TECHNIQUE: Multiplanar multisequence MR imaging of the pelvis with attention to
the sacrum and sacroiliac joints was performed. No intravenous
contrast was administered.

[Series 6: [id number]_[id number]_352 · axial · 4.0mm · 0.40mm/px · z∈[-82,+63]mm · 8 of 30 slices shown]
[im 1/30]
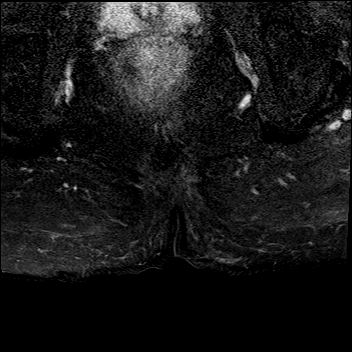
[im 5/30]
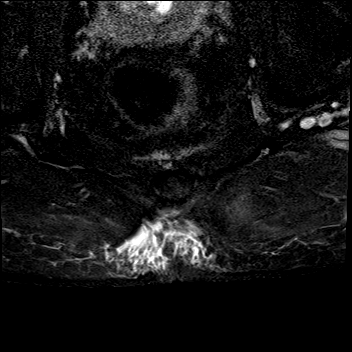
[im 9/30]
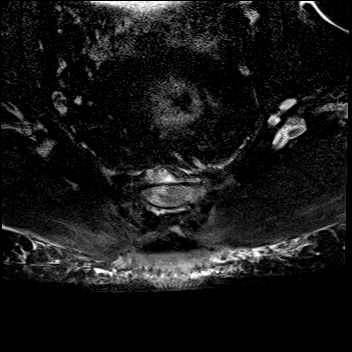
[im 13/30]
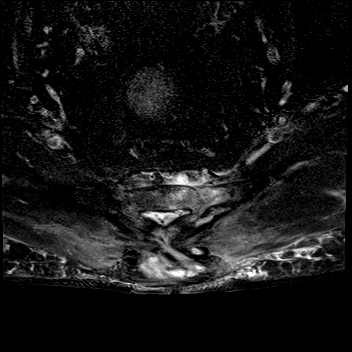
[im 17/30]
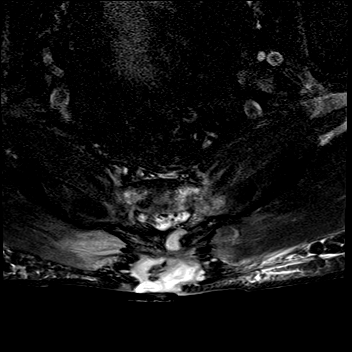
[im 21/30]
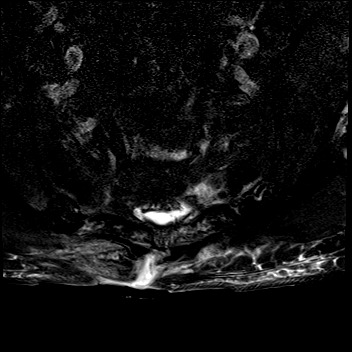
[im 25/30]
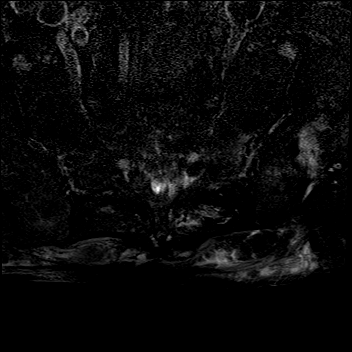
[im 30/30]
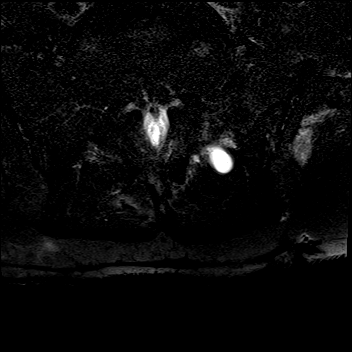

[Series 7: t1_tse_tra_320 repeat · axial · 4.0mm · 0.44mm/px · z∈[-82,+63]mm · 8 of 30 slices shown]
[im 1/30]
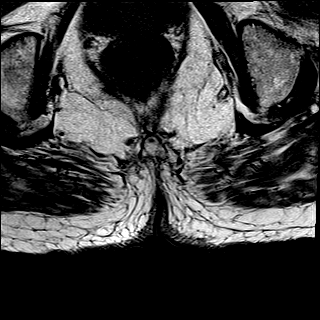
[im 5/30]
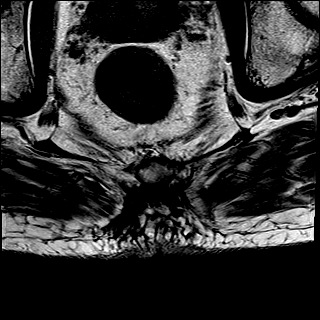
[im 9/30]
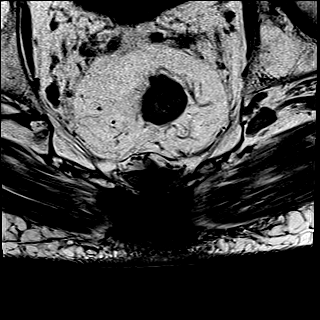
[im 13/30]
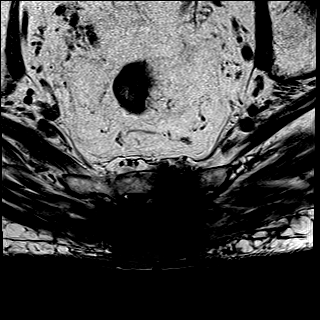
[im 17/30]
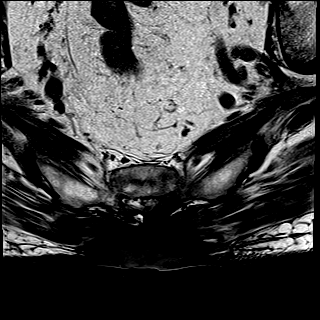
[im 21/30]
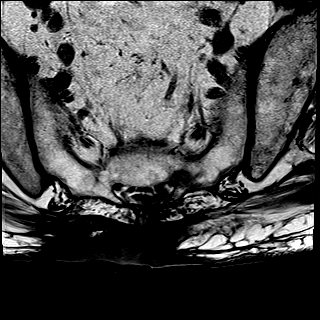
[im 25/30]
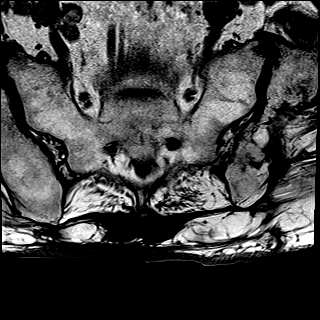
[im 30/30]
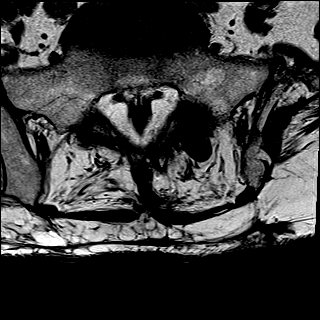

[Series 8: [id number]_fs_tra_320 · axial · 4.0mm · 0.44mm/px · z∈[-82,+63]mm · 8 of 30 slices shown]
[im 1/30]
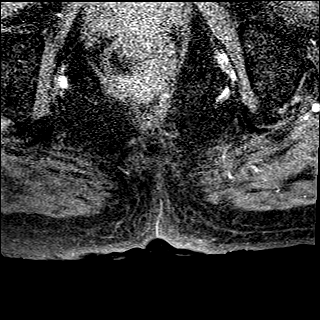
[im 5/30]
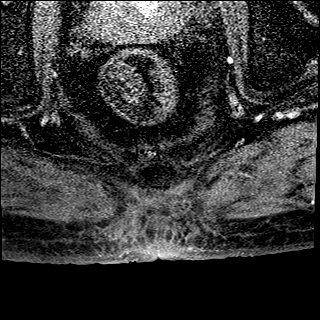
[im 9/30]
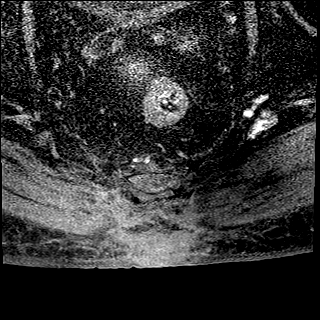
[im 13/30]
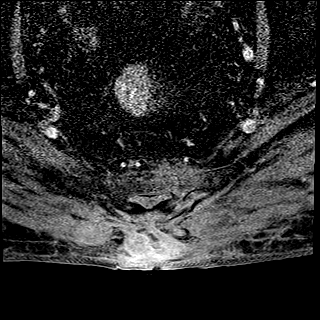
[im 17/30]
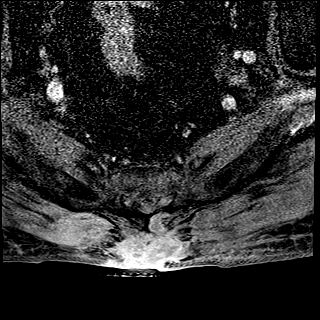
[im 21/30]
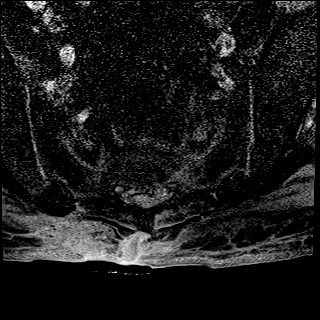
[im 25/30]
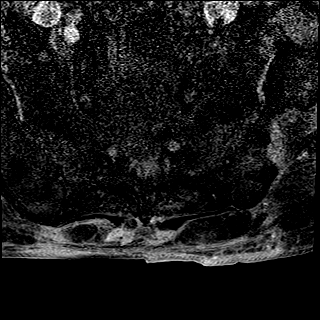
[im 30/30]
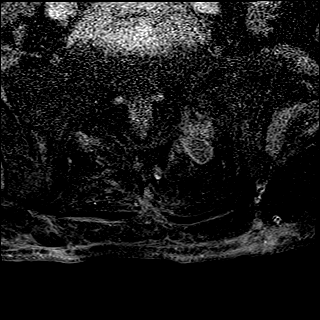

[Series 9: t1_tse_cor_304 · coronal · 4.0mm · 0.46mm/px · 8 of 30 slices shown]
[im 1/30]
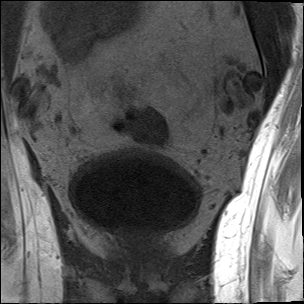
[im 5/30]
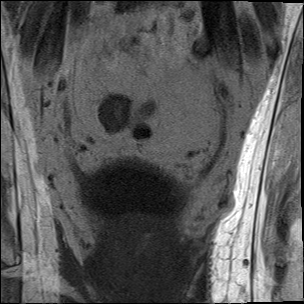
[im 9/30]
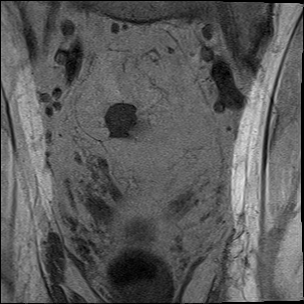
[im 13/30]
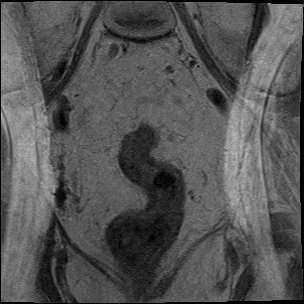
[im 17/30]
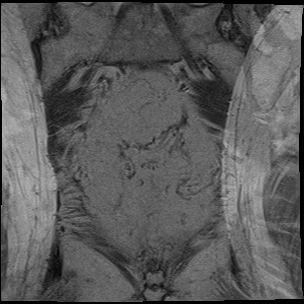
[im 21/30]
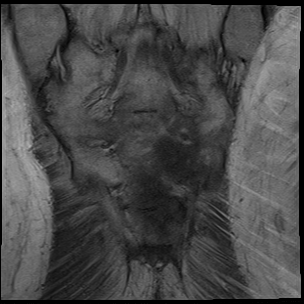
[im 25/30]
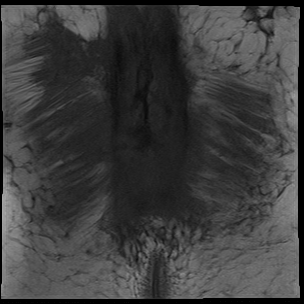
[im 30/30]
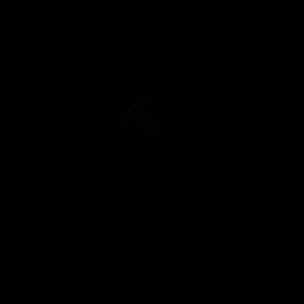

[Series 10: t2_tse_fs_cor_304 · coronal · 4.0mm · 0.46mm/px · 8 of 30 slices shown]
[im 1/30]
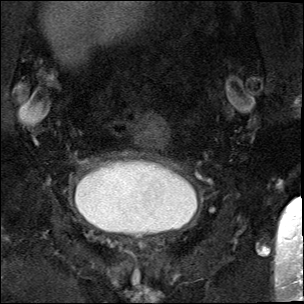
[im 5/30]
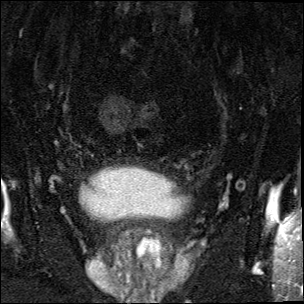
[im 9/30]
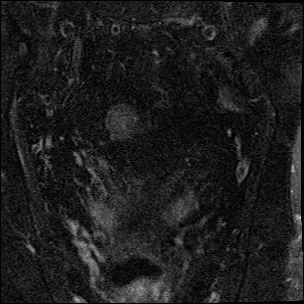
[im 13/30]
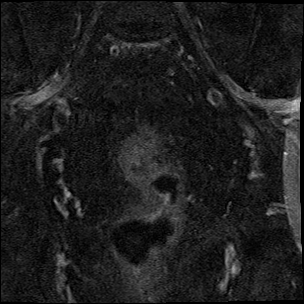
[im 17/30]
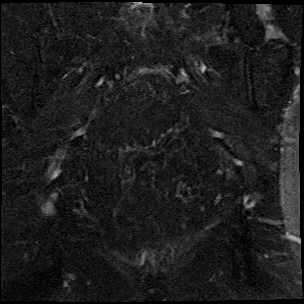
[im 21/30]
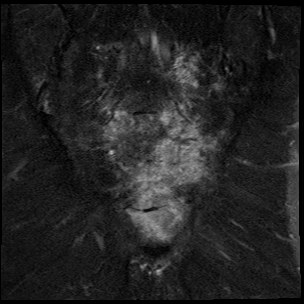
[im 25/30]
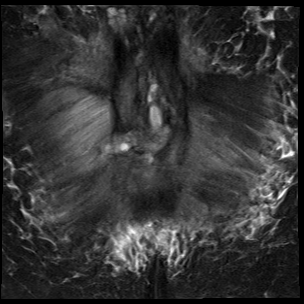
[im 30/30]
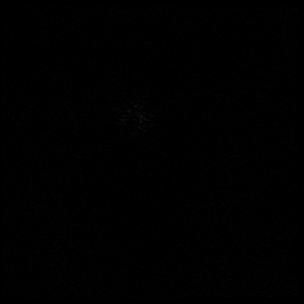

[Series 11: t2_tse_stir_sag_288 · sagittal · 4.0mm · 0.49mm/px · 6 of 31 slices shown]
[im 1/31]
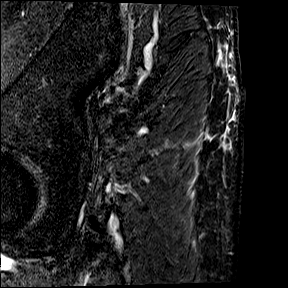
[im 5/31]
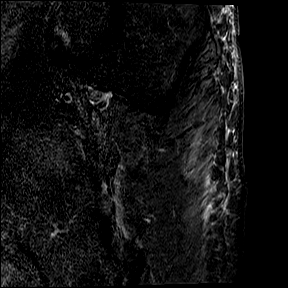
[im 9/31]
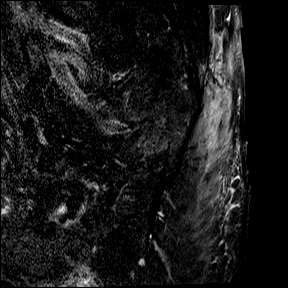
[im 13/31]
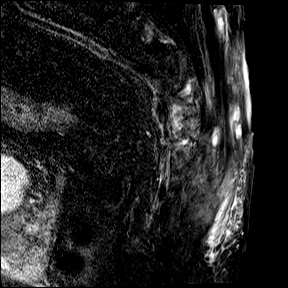
[im 18/31]
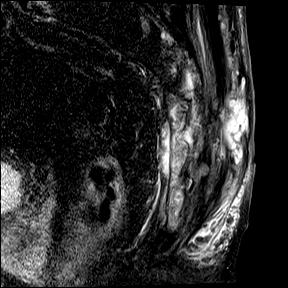
[im 22/31]
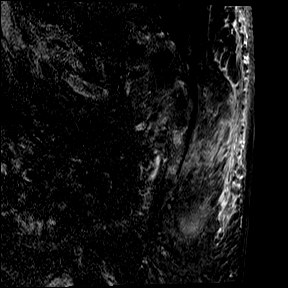

[46 of 48 positions shown; findings below may reference images not displayed]

FINDINGS: Bones/Joint/Cartilage

There is extensive marrow T2 hyperintensity and T1 hypointensity
within the mid to distal sacrum. There is associated cortical
destruction in the posterior elements of the lower sacrum. These
marrow changes are asymmetric to the left, extending to the left
sacroiliac joint. There is no evidence of sacroiliitis or
involvement of the adjacent iliac bones. There is a chronic
postsurgical defect laterally in the left iliac bone.

Ligaments

Not relevant for exam/indication.

Muscles and Tendons
There is diffuse fatty atrophy throughout the lower erector spinae,
gluteus and piriformis muscles. There is T2 hyperintensity within
the medial aspects of the gluteus maximus muscles along the inferior
aspect of the sacrum. No focal intramuscular fluid collection.

Soft tissues
There is a large skin defect over the distal sacrum consistent with
a decubitus ulcer. There is an underlying complex fluid collection
superficial to the distal sacrum, measuring 5.9 x 4.4 x 2.8 cm. This
fluid collection abuts the posterior aspect of the distal sacrum
which shows cortical destruction and T2 marrow hyperintensity
consistent with osteomyelitis. Inflammatory changes extend into the
presacral soft tissues. There is no drainable intrapelvic fluid
collection or definite intraspinal extension. There is mild edema
throughout the subcutaneous fat surrounding the decubitus ulcer.
IMPRESSION: 1. Sacral decubitus ulcer with underlying superficial abscess and
osteomyelitis of the distal sacrum.
2. No definite intraspinal extension or involvement of the
sacroiliac joints.

## 2019-06-14 ENCOUNTER — Inpatient Hospital Stay
Admission: EM | Admit: 2019-06-14 | Discharge: 2019-06-18 | DRG: 871 | Disposition: A | Discharge status: Hospice | Payer: Medicare Other | Attending: Internal Medicine | Admitting: Internal Medicine

## 2019-06-14 ENCOUNTER — Emergency Department: Payer: Medicare Other

## 2019-06-14 ENCOUNTER — Other Ambulatory Visit: Payer: Self-pay

## 2019-06-14 DIAGNOSIS — Z955 Presence of coronary angioplasty implant and graft: Secondary | ICD-10-CM

## 2019-06-14 DIAGNOSIS — Z792 Long term (current) use of antibiotics: Secondary | ICD-10-CM | POA: Diagnosis not present

## 2019-06-14 DIAGNOSIS — E538 Deficiency of other specified B group vitamins: Secondary | ICD-10-CM | POA: Diagnosis present

## 2019-06-14 DIAGNOSIS — G9341 Metabolic encephalopathy: Secondary | ICD-10-CM | POA: Diagnosis present

## 2019-06-14 DIAGNOSIS — Z20828 Contact with and (suspected) exposure to other viral communicable diseases: Secondary | ICD-10-CM | POA: Diagnosis present

## 2019-06-14 DIAGNOSIS — Z79899 Other long term (current) drug therapy: Secondary | ICD-10-CM

## 2019-06-14 DIAGNOSIS — Z888 Allergy status to other drugs, medicaments and biological substances status: Secondary | ICD-10-CM

## 2019-06-14 DIAGNOSIS — Z8249 Family history of ischemic heart disease and other diseases of the circulatory system: Secondary | ICD-10-CM

## 2019-06-14 DIAGNOSIS — Z886 Allergy status to analgesic agent status: Secondary | ICD-10-CM

## 2019-06-14 DIAGNOSIS — A419 Sepsis, unspecified organism: Secondary | ICD-10-CM | POA: Diagnosis not present

## 2019-06-14 DIAGNOSIS — J9811 Atelectasis: Secondary | ICD-10-CM | POA: Diagnosis present

## 2019-06-14 DIAGNOSIS — I252 Old myocardial infarction: Secondary | ICD-10-CM | POA: Diagnosis not present

## 2019-06-14 DIAGNOSIS — E785 Hyperlipidemia, unspecified: Secondary | ICD-10-CM | POA: Diagnosis present

## 2019-06-14 DIAGNOSIS — G8929 Other chronic pain: Secondary | ICD-10-CM | POA: Diagnosis present

## 2019-06-14 DIAGNOSIS — Z7901 Long term (current) use of anticoagulants: Secondary | ICD-10-CM

## 2019-06-14 DIAGNOSIS — Z66 Do not resuscitate: Secondary | ICD-10-CM | POA: Diagnosis present

## 2019-06-14 DIAGNOSIS — Z885 Allergy status to narcotic agent status: Secondary | ICD-10-CM

## 2019-06-14 DIAGNOSIS — E876 Hypokalemia: Secondary | ICD-10-CM | POA: Diagnosis present

## 2019-06-14 DIAGNOSIS — K449 Diaphragmatic hernia without obstruction or gangrene: Secondary | ICD-10-CM | POA: Diagnosis present

## 2019-06-14 DIAGNOSIS — J302 Other seasonal allergic rhinitis: Secondary | ICD-10-CM | POA: Diagnosis present

## 2019-06-14 DIAGNOSIS — N400 Enlarged prostate without lower urinary tract symptoms: Secondary | ICD-10-CM | POA: Diagnosis present

## 2019-06-14 DIAGNOSIS — M25559 Pain in unspecified hip: Secondary | ICD-10-CM

## 2019-06-14 DIAGNOSIS — I160 Hypertensive urgency: Secondary | ICD-10-CM | POA: Diagnosis present

## 2019-06-14 DIAGNOSIS — M25551 Pain in right hip: Secondary | ICD-10-CM

## 2019-06-14 DIAGNOSIS — Z981 Arthrodesis status: Secondary | ICD-10-CM

## 2019-06-14 DIAGNOSIS — Z7189 Other specified counseling: Secondary | ICD-10-CM | POA: Diagnosis not present

## 2019-06-14 DIAGNOSIS — Z85820 Personal history of malignant melanoma of skin: Secondary | ICD-10-CM | POA: Diagnosis not present

## 2019-06-14 DIAGNOSIS — Z515 Encounter for palliative care: Secondary | ICD-10-CM | POA: Diagnosis not present

## 2019-06-14 DIAGNOSIS — Z882 Allergy status to sulfonamides status: Secondary | ICD-10-CM

## 2019-06-14 DIAGNOSIS — L03114 Cellulitis of left upper limb: Secondary | ICD-10-CM | POA: Diagnosis present

## 2019-06-14 DIAGNOSIS — I480 Paroxysmal atrial fibrillation: Secondary | ICD-10-CM | POA: Diagnosis present

## 2019-06-14 DIAGNOSIS — Z91048 Other nonmedicinal substance allergy status: Secondary | ICD-10-CM

## 2019-06-14 DIAGNOSIS — K219 Gastro-esophageal reflux disease without esophagitis: Secondary | ICD-10-CM | POA: Diagnosis present

## 2019-06-14 DIAGNOSIS — Z833 Family history of diabetes mellitus: Secondary | ICD-10-CM

## 2019-06-14 DIAGNOSIS — I10 Essential (primary) hypertension: Secondary | ICD-10-CM | POA: Diagnosis present

## 2019-06-14 DIAGNOSIS — I2510 Atherosclerotic heart disease of native coronary artery without angina pectoris: Secondary | ICD-10-CM | POA: Diagnosis present

## 2019-06-14 DIAGNOSIS — Z91041 Radiographic dye allergy status: Secondary | ICD-10-CM

## 2019-06-14 LAB — PROTIME-INR
INR: 1.1 (ref 0.8–1.2)
Prothrombin Time: 13.7 seconds (ref 11.4–15.2)

## 2019-06-14 LAB — COMPREHENSIVE METABOLIC PANEL
ALT: 13 U/L (ref 0–44)
AST: 21 U/L (ref 15–41)
Albumin: 3.4 g/dL — ABNORMAL LOW (ref 3.5–5.0)
Alkaline Phosphatase: 92 U/L (ref 38–126)
Anion gap: 8 (ref 5–15)
BUN: 16 mg/dL (ref 8–23)
CO2: 25 mmol/L (ref 22–32)
Calcium: 8.9 mg/dL (ref 8.9–10.3)
Chloride: 102 mmol/L (ref 98–111)
Creatinine, Ser: 1.14 mg/dL (ref 0.61–1.24)
GFR calc Af Amer: >60 mL/min (ref >60)
GFR calc non Af Amer: 58 mL/min — ABNORMAL LOW (ref >60)
Glucose, Bld: 157 mg/dL — ABNORMAL HIGH (ref 70–99)
Potassium: 4 mmol/L (ref 3.5–5.1)
Sodium: 135 mmol/L (ref 135–145)
Total Bilirubin: 0.5 mg/dL (ref 0.3–1.2)
Total Protein: 6.6 g/dL (ref 6.5–8.1)

## 2019-06-14 LAB — CBC WITH DIFFERENTIAL/PLATELET
Abs Immature Granulocytes: 0.08 10*3/uL — ABNORMAL HIGH (ref 0.00–0.07)
Basophils Absolute: 0.1 10*3/uL (ref 0.0–0.1)
Basophils Relative: 0 %
Eosinophils Absolute: 0.1 10*3/uL (ref 0.0–0.5)
Eosinophils Relative: 0 %
HCT: 32.3 % — ABNORMAL LOW (ref 39.0–52.0)
Hemoglobin: 10.7 g/dL — ABNORMAL LOW (ref 13.0–17.0)
Immature Granulocytes: 1 %
Lymphocytes Relative: 8 %
Lymphs Abs: 1.1 10*3/uL (ref 0.7–4.0)
MCH: 29.2 pg (ref 26.0–34.0)
MCHC: 33.1 g/dL (ref 30.0–36.0)
MCV: 88 fL (ref 80.0–100.0)
Monocytes Absolute: 1.2 10*3/uL — ABNORMAL HIGH (ref 0.1–1.0)
Monocytes Relative: 8 %
Neutro Abs: 11.6 10*3/uL — ABNORMAL HIGH (ref 1.7–7.7)
Neutrophils Relative %: 83 %
Platelets: 272 10*3/uL (ref 150–400)
RBC: 3.67 MIL/uL — ABNORMAL LOW (ref 4.22–5.81)
RDW: 15.7 % — ABNORMAL HIGH (ref 11.5–15.5)
WBC: 14.1 10*3/uL — ABNORMAL HIGH (ref 4.0–10.5)
nRBC: 0 % (ref 0.0–0.2)

## 2019-06-14 LAB — URINALYSIS, ROUTINE W REFLEX MICROSCOPIC
Bacteria, UA: NONE SEEN
Bilirubin Urine: NEGATIVE
Glucose, UA: NEGATIVE mg/dL
Ketones, ur: NEGATIVE mg/dL
Leukocytes,Ua: NEGATIVE
Nitrite: NEGATIVE
Protein, ur: NEGATIVE mg/dL
Specific Gravity, Urine: 1.016 (ref 1.005–1.030)
Squamous Epithelial / HPF: NONE SEEN (ref 0–5)
pH: 5 (ref 5.0–8.0)

## 2019-06-14 LAB — SEDIMENTATION RATE: Sed Rate: 35 mm/hr — ABNORMAL HIGH (ref 0–20)

## 2019-06-14 LAB — APTT: aPTT: 35 seconds (ref 24–36)

## 2019-06-14 LAB — LACTIC ACID, PLASMA: Lactic Acid, Venous: 1.3 mmol/L (ref 0.5–1.9)

## 2019-06-14 MED ORDER — VITAMIN B-12 1000 MCG PO TABS
1000.0000 ug | ORAL_TABLET | Freq: Every day | ORAL | Status: DC
  Administered 2019-06-15 – 2019-06-17 (×3): 1000 ug via ORAL
  Filled 2019-06-14 (×4): qty 1

## 2019-06-14 MED ORDER — MELATONIN 5 MG PO TABS
10.0000 mg | ORAL_TABLET | Freq: Every day | ORAL | Status: DC
  Administered 2019-06-16 – 2019-06-17 (×2): 10 mg via ORAL
  Filled 2019-06-14 (×4): qty 2

## 2019-06-14 MED ORDER — DOCUSATE SODIUM 100 MG PO CAPS
100.0000 mg | ORAL_CAPSULE | Freq: Two times a day (BID) | ORAL | Status: DC | PRN
  Administered 2019-06-15 (×2): 100 mg via ORAL
  Filled 2019-06-14 (×2): qty 1

## 2019-06-14 MED ORDER — VANCOMYCIN HCL 1.5 G IV SOLR
1500.0000 mg | Freq: Once | INTRAVENOUS | Status: AC
  Administered 2019-06-14: 1500 mg via INTRAVENOUS
  Filled 2019-06-14: qty 1500

## 2019-06-14 MED ORDER — SODIUM CHLORIDE 0.9 % IV BOLUS
1000.0000 mL | Freq: Once | INTRAVENOUS | Status: AC
  Administered 2019-06-14: 1000 mL via INTRAVENOUS

## 2019-06-14 MED ORDER — PIPERACILLIN-TAZOBACTAM 3.375 G IVPB 30 MIN
3.3750 g | Freq: Once | INTRAVENOUS | Status: AC
  Administered 2019-06-14: 3.375 g via INTRAVENOUS
  Filled 2019-06-14: qty 50

## 2019-06-14 MED ORDER — AMLODIPINE BESYLATE 5 MG PO TABS
2.5000 mg | ORAL_TABLET | Freq: Every day | ORAL | Status: DC
  Administered 2019-06-15 – 2019-06-16 (×2): 2.5 mg via ORAL
  Filled 2019-06-14 (×2): qty 1

## 2019-06-14 MED ORDER — DOXAZOSIN MESYLATE 1 MG PO TABS
1.0000 mg | ORAL_TABLET | Freq: Every day | ORAL | Status: DC
  Administered 2019-06-15 – 2019-06-17 (×3): 1 mg via ORAL
  Filled 2019-06-14 (×4): qty 1

## 2019-06-14 MED ORDER — APIXABAN 5 MG PO TABS
5.0000 mg | ORAL_TABLET | Freq: Two times a day (BID) | ORAL | Status: DC
  Administered 2019-06-15 – 2019-06-17 (×6): 5 mg via ORAL
  Filled 2019-06-14 (×7): qty 1

## 2019-06-14 MED ORDER — PANTOPRAZOLE SODIUM 40 MG PO TBEC
40.0000 mg | DELAYED_RELEASE_TABLET | Freq: Two times a day (BID) | ORAL | Status: DC
  Administered 2019-06-15 – 2019-06-18 (×7): 40 mg via ORAL
  Filled 2019-06-14 (×7): qty 1

## 2019-06-14 MED ORDER — ATORVASTATIN CALCIUM 20 MG PO TABS
40.0000 mg | ORAL_TABLET | Freq: Every day | ORAL | Status: DC
  Administered 2019-06-15 – 2019-06-16 (×2): 40 mg via ORAL
  Filled 2019-06-14 (×3): qty 2

## 2019-06-14 MED ORDER — HYDROCODONE-ACETAMINOPHEN 7.5-325 MG PO TABS: 1.0000 | ORAL_TABLET | ORAL | Status: DC | PRN

## 2019-06-14 MED ORDER — TAMSULOSIN HCL 0.4 MG PO CAPS
0.4000 mg | ORAL_CAPSULE | Freq: Two times a day (BID) | ORAL | Status: DC
  Administered 2019-06-15 – 2019-06-18 (×7): 0.4 mg via ORAL
  Filled 2019-06-14 (×7): qty 1

## 2019-06-14 MED ORDER — ENSURE ENLIVE PO LIQD
237.0000 mL | Freq: Two times a day (BID) | ORAL | Status: DC
  Administered 2019-06-15 – 2019-06-17 (×5): 237 mL via ORAL

## 2019-06-14 MED ORDER — AMIODARONE HCL 200 MG PO TABS
100.0000 mg | ORAL_TABLET | Freq: Every day | ORAL | Status: DC
  Administered 2019-06-15 – 2019-06-18 (×4): 100 mg via ORAL
  Filled 2019-06-14 (×4): qty 1

## 2019-06-14 NOTE — ED Provider Notes (Signed)
[facility] Emergency Department Provider Note  ____________________________________________   First MD Initiated Contact with Patient 06/14/19 2134     (approximate)  I have reviewed the triage vital signs and the nursing notes.   HISTORY  Chief Complaint Fever and Post-op Problem    HPI [name] is a 85 y.o. male who is status post carpal tunnel surgery at [facility] on Monday who presents with fevers.  Patient noticed worsening mental status the past few days and so they stopped taking the hydrocodone and start taking Tylenol.  However he continued to ask strange so they called ems. Patient was noted to have a fever with EMS. Per family how is he acting is exactly how he acted when on hydrocodone in the past.  He is just more confused then baseline.  No urinary symptoms, abd pain, sob.  Fever onset unclear when it started due to them given tylenol for pain that could have been masking it, nothing makes it better or worse, constant.           Past Medical History:  Diagnosis Date  . Anginal pain ([place])   . Arthritis    "hips, back" (06/17/2015)  . Asthma   . Atrial fibrillation ([place]) 01/21/2018  . Chronic chest pain   . Chronic lower back pain   . Coronary artery disease    a. s/p BMS to RCA in 2002; b. s/p cutting balloon POBA ;   c. cath 6/12: oDx 80% (treated with repeat cutting balloon POBA), mLAD 50% with 30-40% at Dx, CFX 30%, pRCA 25% with patent stents;  d.  Lex MV 4/14:  Low Risk - EF 61%, inf scar with peri-infarct ischemia  . Dyspnea    chronic  . Dysrhythmia   . Essential hypertension   . GERD (gastroesophageal reflux disease)    h/o esophageal spasm  . GI bleed 03/03/2014  . Headache   . History of blood transfusion    "related to OR"  . History of hiatal hernia   . Hyperlipidemia   . Hypertension   . Melanoma of lower back ([place]) late 1990's  . Memory loss   . Myocardial infarction ([place]) 2001   x 1, confirned 1 possible   . Pre-syncope 07/31/2017    Patient Active Problem List   Diagnosis Date Noted  . GI bleed 09/24/2018  . Medication monitoring encounter 03/22/2018  . Malnutrition of moderate degree 01/22/2018  . Sacral decubitus ulcer, healing stage 4 01/22/2018  . Atrial fibrillation ([place]) 01/21/2018  . Unresponsive episode 01/20/2018  . Elevated troponin 01/20/2018  . Pre-syncope 07/31/2017  . B12 deficiency 06/19/2017  . Essential hypertension   . Bladder outlet obstruction   . Generalized weakness 03/03/2014  . ESOPHAGEAL STRICTURE 09/20/2010  . ORTHOSTATIC DIZZINESS 08/30/2010  . IBS 08/26/2010  . RECTAL BLEEDING 08/26/2010  . RECTAL PAIN 08/26/2010  . ARTHRITIS 08/26/2010  . LOSS OF APPETITE 08/26/2010  . OTHER DYSPHAGIA 08/26/2010  . COLONIC POLYPS, HX OF 08/26/2010  . PALPITATIONS, HX OF 08/18/2010  . PERIPHERAL NEUROPATHY 05/01/2009  . History of myocardial infarction 05/01/2009  . ALLERGIC RHINITIS, SEASONAL 05/01/2009  . DEPRESSION, HX OF 05/01/2009  . Personal history of other diseases of digestive system 05/01/2009  . NEPHROLITHIASIS, HX OF 05/01/2009  . BENIGN PROSTATIC HYPERTROPHY, HX OF 05/01/2009  . LAMINECTOMY, HX OF 05/01/2009  . Hyperlipidemia 10/20/2008  . CAD (coronary artery disease) 10/20/2008  . GERD 10/20/2008    Past Surgical History:  Procedure Laterality  Date  . ANTERIOR CERVICAL DECOMP/DISCECTOMY FUSION    . BACK SURGERY  multiple  . CARDIAC CATHETERIZATION     "I've had 17 caths" (06/17/2015)  . CATARACT EXTRACTION W/ INTRAOCULAR LENS  IMPLANT, BILATERAL Bilateral   . CERVICAL DISC SURGERY  multiple  . COLONOSCOPY WITH PROPOFOL N/A 04/17/2014   Procedure: COLONOSCOPY WITH PROPOFOL;  Surgeon: Winfield Cunas., MD;  Location: [facility] ENDOSCOPY;  Service: Endoscopy;  Laterality: N/A;  . CORONARY ANGIOPLASTY WITH STENT PLACEMENT  x 2 stents    previous percutaneous intervention on the  RCA and the diagonal branch  . ESOPHAGOGASTRODUODENOSCOPY  03/23/2012    Procedure: ESOPHAGOGASTRODUODENOSCOPY (EGD);  Surgeon: Winfield Cunas., MD;  Location: [facility];  Service: Endoscopy;  Laterality: N/A;  . ESOPHAGOGASTRODUODENOSCOPY N/A 03/04/2014   Procedure: ESOPHAGOGASTRODUODENOSCOPY (EGD);  Surgeon: Arta Silence, MD;  [facility] ENDOSCOPY;  Service: Endoscopy;  Laterality: N/A;  . LAMINECTOMY    . LEFT HEART CATHETERIZATION WITH CORONARY ANGIOGRAM N/A 01/13/2014   Procedure: LEFT HEART CATHETERIZATION WITH CORONARY ANGIOGRAM;  Surgeon: Blane Ohara, MD;  Location: [facility] CATH LAB;  Service: Cardiovascular;  Laterality: N/A;  . MELANOMA EXCISION  late 1990's   "lower back"  . POSTERIOR LAMINECTOMY / DECOMPRESSION CERVICAL SPINE    . SAVORY DILATION  03/23/2012   Procedure: SAVORY DILATION;  Surgeon: Winfield Cunas., MD;  Location: [facility] ENDOSCOPY;  Service: Endoscopy;  Laterality: N/A;  . TONSILLECTOMY  1930's  . WOUND DEBRIDEMENT N/A 12/18/2018   Procedure: SACRAL WOUND EXPLORATION AND DEBRIDEMENT;  Surgeon: Benjamine Sprague, DO;  Location: [facility] ORS;  Service: General;  Laterality: N/A;    Prior to Admission medications   Medication Sig Start Date End Date Taking? Authorizing Provider  amiodarone (PACERONE) 200 MG tablet Take 0.5 tablets (100 mg total) by mouth daily. 09/11/18   Fay Records, MD  amLODipine (NORVASC) 2.5 MG tablet TAKE 1/2 TABLET BY MOUTH ONCE DAILY. 01/09/19   Fay Records, MD  apixaban (ELIQUIS) 5 MG TABS tablet Take 1 tablet (5 mg total) by mouth 2 (two) times daily. 09/30/18   Thurnell Lose, MD  atorvastatin (LIPITOR) 80 MG tablet Take 1 tablet (80 mg total) by mouth daily. 12/27/17 12/17/18  Richardson Dopp T, PA-C  docusate sodium (COLACE) 100 MG capsule TAKE (1) CAPSULE BY MOUTH TWICE DAILY. 01/08/19   Lysle Pearl, Isami, DO  doxazosin (CARDURA) 1 MG tablet TAKE 1 TABLET BY MOUTH ONCE DAILY. Patient taking differently: Take 1 mg by mouth daily.  06/01/17   Fay Records, MD  doxycycline (VIBRAMYCIN) 100 MG capsule Take 100 mg by mouth  2 (two) times daily. 12/03/18   [provider]  feeding supplement, ENSURE ENLIVE, (ENSURE ENLIVE) LIQD Take 237 mLs by mouth 2 (two) times daily between meals. 08/02/17   Fritzi Mandes, MD  HYDROcodone-acetaminophen (NORCO) 7.5-325 MG tablet Take 1 tablet by mouth every 4 (four) hours as needed for moderate pain. FOR PAIN Patient taking differently: Take 1 tablet by mouth every 4 (four) hours as needed for moderate pain.  01/24/18   Gladstone Lighter, MD  Melatonin 10 MG TABS Take 10 mg by mouth at bedtime.    [provider]  nitroGLYCERIN (NITROSTAT) 0.4 MG SL tablet Place 0.4 mg under the tongue every 5 (five) minutes as needed for chest pain.    [provider]  pantoprazole (PROTONIX) 40 MG tablet TAKE 1 TABLET BY MOUTH TWICE DAILY Patient taking differently: Take 40 mg by mouth daily.  06/01/17  Fay Records, MD  phenylephrine-shark liver oil-mineral oil-petrolatum (PREPARATION H) 0.25-3-14-71.9 % rectal ointment Place 1 application rectally 2 (two) times daily as needed for hemorrhoids.    [provider]  tamsulosin (FLOMAX) 0.4 MG CAPS capsule Take 0.4 mg by mouth 2 (two) times daily.  01/13/15   [provider]  vitamin B-12 1000 MCG tablet Take 1 tablet (1,000 mcg total) by mouth daily. 06/21/17   Geradine Girt, DO    Allergies Budesonide-formoterol fumarate, Iohexol, Ivp dye [iodinated diagnostic agents], Simvastatin, Demerol [meperidine], Tape, Naproxen, Pregabalin, and Sulfonamide derivatives  Family History  Problem Relation Age of Onset  . Diabetes Father   . Heart disease Father   . Asthma Father   . Heart disease Mother        CABG hx age 101  . Colon cancer Son        hx   . Colitis Son        hx  . Crohn's disease Son   . Prostate cancer Paternal Grandfather     Social History Social History   Tobacco Use  . Smoking status: Never Smoker  . Smokeless tobacco: Never Used  Substance Use Topics  . Alcohol use: No  .  Drug use: No      Review of Systems Constitutional: + fever Eyes: No visual changes. ENT: No sore throat. Cardiovascular: Denies chest pain. Respiratory: Denies shortness of breath. Gastrointestinal: No abdominal pain.  No nausea, no vomiting.  No diarrhea.  No constipation. Genitourinary: Negative for dysuria. Musculoskeletal: Negative for back pain. + recent surgery Skin: Negative for rash. Neurological: Negative for headaches, focal weakness or numbness. + confusion All other ROS negative ____________________________________________   PHYSICAL EXAM:  VITAL SIGNS: ED Triage Vitals  Enc Vitals Group     BP      Pulse    Today's Vitals   06/15/19 0519 06/15/19 0624 06/15/19 0645 06/15/19 0846  BP: (!) 182/77 (!) 168/83  (!) 164/80  Pulse: 80 77  73  Resp: 20   20  Temp: 98.5 F (36.9 C)   99.2 F (37.3 C)  TempSrc:    Oral  SpO2: 99%   97%  Weight:      Height:      PainSc:   Asleep    Body mass index is 24.09 kg/m. Constitutional: Alert pleasant elderly man. Well appearing and in no acute distress. Eyes: Conjunctivae are normal. EOMI. Head: Atraumatic. Nose: No congestion/rhinnorhea. Mouth/Throat: Mucous membranes are moist.   Neck: No stridor. Trachea Midline. FROM Cardiovascular: Normal rate, regular rhythm. Grossly normal heart sounds.  Good peripheral circulation. Respiratory: Normal respiratory effort.  No retractions. Lungs CTAB. Gastrointestinal: Soft and nontender. No distention. No abdominal bruits.  Musculoskeletal: No lower extremity tenderness nor edema. L arm with sutures intact, mild erythema, swelling, warmth, and mild redness.  Neurologic:  Normal speech and language. No gross focal neurologic deficits are appreciated.  Skin:  Skin is warm, dry and intact. No rash noted. Psychiatric: Mood and affect are normal.Somewhat confused and off from baseline.  GU: Deferred   ____________________________________________   LABS (all labs ordered are  listed, but only abnormal results are displayed)  Labs Reviewed  CBC WITH DIFFERENTIAL/PLATELET - Abnormal; Notable for the following components:      Result Value   WBC 14.1 (*)    RBC 3.67 (*)    Hemoglobin 10.7 (*)    HCT 32.3 (*)    RDW 15.7 (*)    Neutro  Abs 11.6 (*)    Monocytes Absolute 1.2 (*)    Abs Immature Granulocytes 0.08 (*)    All other components within normal limits  COMPREHENSIVE METABOLIC PANEL - Abnormal; Notable for the following components:   Glucose, Bld 157 (*)    Albumin 3.4 (*)    GFR calc non Af Amer 58 (*)    All other components within normal limits  SEDIMENTATION RATE - Abnormal; Notable for the following components:   Sed Rate 35 (*)    All other components within normal limits  C-REACTIVE PROTEIN - Abnormal; Notable for the following components:   CRP 7.1 (*)    All other components within normal limits  URINALYSIS, ROUTINE W REFLEX MICROSCOPIC - Abnormal; Notable for the following components:   Color, Urine YELLOW (*)    APPearance CLEAR (*)    Hgb urine dipstick SMALL (*)    All other components within normal limits  CBC WITH DIFFERENTIAL/PLATELET - Abnormal; Notable for the following components:   WBC 15.3 (*)    RBC 3.42 (*)    Hemoglobin 9.9 (*)    HCT 30.6 (*)    RDW 15.7 (*)    Neutro Abs 12.2 (*)    Monocytes Absolute 1.4 (*)    All other components within normal limits  BASIC METABOLIC PANEL - Abnormal; Notable for the following components:   Potassium 3.4 (*)    Glucose, Bld 121 (*)    Calcium 8.2 (*)    All other components within normal limits  CULTURE, BLOOD (ROUTINE X 2)  CULTURE, BLOOD (ROUTINE X 2)  SARS CORONAVIRUS 2 (HOSPITAL ORDER, [facility])  MRSA PCR SCREENING  URINE CULTURE  PROTIME-INR  APTT  LACTIC ACID, PLASMA  LACTIC ACID, PLASMA   ____________________________________________   ED ECG REPORT I, Vanessa [place], the attending physician, personally viewed and interpreted this  ECG.  Normal sinus rate of 90, no st elevation, no twi,normal intervals  ____________________________________________  RADIOLOGY Robert Bellow, personally viewed and evaluated these images (plain radiographs) as part of my medical decision making, as well as reviewing the written report by the radiologist.  ED MD interpretation:  No PNA, xray hand no fractures.   Official radiology report(s): Dg Lumbar Spine Complete  Result Date: 06/15/2019 CLINICAL DATA:  Low back pain and hip pain over the last 2 weeks after a fall. EXAM: LUMBAR SPINE - COMPLETE 4+ VIEW COMPARISON:  07/31/2017 FINDINGS: Previous posterior decompression and fusion surgery from L2 through L4. Solid union without traumatic finding. No hardware complication. Chronic degenerative disease at the adjacent L1-2 level with disc space narrowing and vacuum phenomenon. No visible change by radiography. Old superior endplate compression deformity at T12 is unchanged. Probable posterior bony fusion from L4 to the sacrum. IMPRESSION: No acute or traumatic finding. Lumbosacral fusion from L2 to the sacrum. Pedicle screws and posterior rods L2 through L4. Chronic adjacent segment degenerative disease at L1-2 without visible change by radiography. This could certainly be painful. Old superior endplate compression deformity at T12 appears unchanged since 2018. Electronically Signed   By: Nelson Chimes M.D.   On: 06/15/2019 09:08   Dg Wrist Complete Left  Result Date: 06/14/2019 CLINICAL DATA:  Fever, weakness. Recent carpal tunnel surgery. Concern for possible infection EXAM: LEFT WRIST - COMPLETE 3+ VIEW COMPARISON:  None. FINDINGS: There is chondrocalcinosis within the left wrist. Diffuse arthritic change with joint space narrowing and spurring throughout the left wrist. No acute bony abnormality.  Specifically, no fracture, subluxation, or dislocation. IMPRESSION: Chondrocalcinosis and diffuse degenerative changes. No acute bony abnormality.  Electronically Signed   By: Rolm Baptise M.D.   On: 06/14/2019 22:35   Dg Hand 2 View Left  Result Date: 06/14/2019 CLINICAL DATA:  Recent carpal tunnel surgery. Fever, concern for infection EXAM: LEFT HAND - 2 VIEW COMPARISON:  Left wrist today. FINDINGS: Degenerative changes throughout the left wrist joints. No acute bony abnormality. Specifically, no fracture, subluxation, or dislocation. 4 mm radiopaque density within the soft tissues of the left index finger. IMPRESSION: No acute bony abnormality. Radiopaque foreign body within the soft tissues of the left index finger. Electronically Signed   By: Rolm Baptise M.D.   On: 06/14/2019 22:35   Dg Chest Portable 1 View  Result Date: 06/14/2019 CLINICAL DATA:  Fever, weakness EXAM: PORTABLE CHEST 1 VIEW COMPARISON:  08/20/2018 FINDINGS: Left basilar atelectasis or infiltrate. Right lung clear. Heart is normal size. No effusions or acute bony abnormality. IMPRESSION: Left base atelectasis or infiltrate. Electronically Signed   By: Rolm Baptise M.D.   On: 06/14/2019 22:33   Dg Hip Unilat With Pelvis 2-3 Views Right  Result Date: 06/15/2019 CLINICAL DATA:  Low back and right hip pain over the last 2 weeks after a fall. EXAM: DG HIP (WITH OR WITHOUT PELVIS) 2-3V RIGHT COMPARISON:  CT 09/24/2018 FINDINGS: Osteoarthritis of both hips right worse than left. No sign of fracture or dislocation. IMPRESSION: Osteoarthritis of both hips worse on the right than the left. No traumatic finding. Electronically Signed   By: Nelson Chimes M.D.   On: 06/15/2019 09:06    ____________________________________________   PROCEDURES  Procedure(s) performed (including Critical Care):  .Critical Care Performed by: Vanessa Wood, MD Authorized by: Vanessa Pyote, MD   Critical care provider statement:    Critical care time (minutes):  45   Critical care was necessary to treat or prevent imminent or life-threatening deterioration of the following conditions:  Sepsis    Critical care was time spent personally by me on the following activities:  Discussions with consultants, evaluation of patient's response to treatment, examination of patient, ordering and performing treatments and interventions, ordering and review of laboratory studies, ordering and review of radiographic studies, pulse oximetry, re-evaluation of patient's condition, obtaining history from patient or surrogate and review of old charts     ____________________________________________   INITIAL IMPRESSION / ASSESSMENT AND PLAN / ED COURSE  LORN ELEDGE was evaluated in Emergency Department on 06/14/2019 for the symptoms described in the history of present illness. He was evaluated in the context of the global COVID-19 pandemic, which necessitated consideration that the patient might be at risk for infection with the SARS-CoV-2 virus that causes COVID-19. Institutional protocols and algorithms that pertain to the evaluation of patients at risk for COVID-19 are in a state of rapid change based on information released by regulatory bodies including the CDC and federal and state organizations. These policies and algorithms were followed during the patient's care in the ED.     Pt presents with fever and confusion. Confusions seems to be secondary to hydrocodone use given per family he presented similar when on this medication previously.  The fever most likely from hand cellulitis.  Low suspicion for PNA, UTI, abd infection.  No other joints with swelling/erythema.Low suspicion for meningitis given supple neck and fever more likely from cellulitis.   White count elevated, sepsis alert called, covered broadly with vanc/zosyn.  Covid negative  Will  admit to hospital team.       ____________________________________________   FINAL CLINICAL IMPRESSION(S) / ED DIAGNOSES   Final diagnoses:  Sepsis, due to unspecified organism, unspecified whether acute organ dysfunction present ([place])   Cellulitis of left hand      MEDICATIONS GIVEN DURING THIS VISIT:  Medications  amiodarone (PACERONE) tablet 100 mg (100 mg Oral Given 06/15/19 0851)  amLODipine (NORVASC) tablet 2.5 mg (2.5 mg Oral Given 06/15/19 0852)  atorvastatin (LIPITOR) tablet 40 mg (has no administration in time range)  doxazosin (CARDURA) tablet 1 mg (1 mg Oral Given 06/15/19 0851)  docusate sodium (COLACE) capsule 100 mg (100 mg Oral Given 06/15/19 0902)  pantoprazole (PROTONIX) EC tablet 40 mg (40 mg Oral Given 06/15/19 0854)  tamsulosin (FLOMAX) capsule 0.4 mg (0.4 mg Oral Given 06/15/19 0851)  apixaban (ELIQUIS) tablet 5 mg (5 mg Oral Given 06/15/19 0852)  vitamin B-12 (CYANOCOBALAMIN) tablet 1,000 mcg (1,000 mcg Oral Given 06/15/19 0852)  Melatonin TABS 10 mg (10 mg Oral Not Given 06/15/19 0302)  feeding supplement (ENSURE ENLIVE) (ENSURE ENLIVE) liquid 237 mL (237 mLs Oral Given 06/15/19 0853)  acetaminophen (TYLENOL) tablet 650 mg (has no administration in time range)  alum & mag hydroxide-simeth (MAALOX/MYLANTA) 200-200-20 MG/5ML suspension 30 mL (has no administration in time range)  traZODone (DESYREL) tablet 25 mg (has no administration in time range)  ondansetron (ZOFRAN) injection 4 mg (has no administration in time range)  0.9 %  sodium chloride infusion ( Intravenous New Bag/Given 06/15/19 0412)  acetaminophen (TYLENOL) tablet 650 mg (650 mg Oral Not Given 06/15/19 0301)  piperacillin-tazobactam (ZOSYN) IVPB 3.375 g (3.375 g Intravenous New Bag/Given 06/15/19 0619)  vancomycin (VANCOCIN) 1,250 mg in sodium chloride 0.9 % 250 mL IVPB (has no administration in time range)  labetalol (NORMODYNE) injection 20 mg (has no administration in time range)  polyethylene glycol (MIRALAX / GLYCOLAX) packet 17 g (has no administration in time range)  sodium chloride flush (NS) 0.9 % injection 3 mL (has no administration in time range)  sodium chloride 0.9 % bolus 1,000 mL (0 mLs Intravenous Stopped 06/15/19 0043)   piperacillin-tazobactam (ZOSYN) IVPB 3.375 g (0 g Intravenous Stopped 06/14/19 2321)  vancomycin (VANCOCIN) 1,500 mg in sodium chloride 0.9 % 500 mL IVPB (0 mg Intravenous Stopped 06/15/19 0158)  traMADol (ULTRAM) tablet 50 mg (50 mg Oral Given 06/15/19 0409)     ED Discharge Orders    None       Note:  This document was prepared using Dragon voice recognition software and may include unintentional dictation errors.   Vanessa [place], MD 06/15/19 1122

## 2019-06-14 NOTE — Progress Notes (Signed)
PHARMACY -  BRIEF ANTIBIOTIC NOTE   Pharmacy has received consult(s) for Vancomycin from an ED provider.  The patient's profile has been reviewed for ht/wt/allergies/indication/available labs.    One time order(s) placed for Vancomycin 1500 mg IV X 1  Further antibiotics/pharmacy consults should be ordered by admitting physician if indicated.                       Thank you, Aleksei Goodlin D 06/14/2019  10:11 PM

## 2019-06-14 NOTE — ED Triage Notes (Signed)
Pt arrives to ED via [facility] from home with c/o fever, weakness, and possible complications r/t surgery on Monday (EMS reports pt had surgery in [facility] for carpal tunnel syndrome). EMS states they were called out to the pt's residence today d/t fever and weakness. EMS reports pt was recently taken of Hydrocodone and placed on Tylenol for pain control by his PCP d/t diminished mental status that family believed was caused by the narcotic pain medications. Pt arrives with 20g IV in the right hand; 200 mL NS given by EMS en route.

## 2019-06-14 NOTE — ED Notes (Signed)
Resting quietly.  Family at bedside.

## 2019-06-14 NOTE — Progress Notes (Signed)
WISIN, BRANSTROM ([id number]) Visit Report for 02/04/2019 Chief Complaint Document Details Patient Name: [name]. Date of Service: 02/04/2019 12:30 PM Medical Record Number: [id number] Patient Account Number: [id number] Date of Birth/Sex: Jul 21, 1931 (85 y.o. M) Treating RN: Harold Barban Primary Care Provider: Burman Freestone Other Clinician: Referring Provider: Burman Freestone Treating Provider/Extender: [name], HOYT Weeks in Treatment: 10 Information Obtained from: Patient Chief Complaint Sacral pressure ulcer Electronic Signature(s) Signed: 02/04/2019 11:56:08 PM By: Worthy Keeler PA-C Entered By: Worthy Keeler on 02/04/2019 12:39:33 [name] ([id number]) -------------------------------------------------------------------------------- Debridement Details Patient Name: [name] Date of Service: 02/04/2019 12:30 PM Medical Record Number: [id number] Patient Account Number: [id number] Date of Birth/Sex: 18-Jul-1931 (85 y.o. M) Treating RN: Harold Barban Primary Care Provider: Burman Freestone Other Clinician: Referring Provider: Burman Freestone Treating Provider/Extender: [name], HOYT Weeks in Treatment: 10 Debridement Performed for Wound #2 Midline Sacrum Assessment: Performed By: Physician STONE III, HOYT E., PA-C Debridement Type: Debridement Level of Consciousness (Pre- Awake and Alert procedure): Pre-procedure Verification/Time Yes - 12:55 Out Taken: Start Time: 12:55 Pain Control: Lidocaine Total Area Debrided (L x W): 2.4 (cm) x 0.8 (cm) = 1.92 (cm) Tissue and other material Viable, Non-Viable, Slough, Subcutaneous, Slough debrided: Level: Skin/Subcutaneous Tissue Debridement Description: Excisional Instrument: Curette Bleeding: Moderate Hemostasis Achieved: Pressure End Time: 12:59 Procedural Pain: 0 Post Procedural Pain: 0 Response to Treatment: Procedure was tolerated well Level of Consciousness Awake and  Alert (Post-procedure): Post Debridement Measurements of Total Wound Length: (cm) 2.4 Stage: Category/Stage IV Width: (cm) 0.8 Depth: (cm) 1 Volume: (cm) 1.508 Character of Wound/Ulcer Post Improved Debridement: Post Procedure Diagnosis Same as Pre-procedure Electronic Signature(s) Signed: 02/04/2019 11:56:08 PM By: Worthy Keeler PA-C Signed: 06/14/2019 1:03:35 PM By: Harold Barban Entered By: Harold Barban on 02/04/2019 12:56:13 [name] ([id number]) -------------------------------------------------------------------------------- HPI Details Patient Name: [name] Date of Service: 02/04/2019 12:30 PM Medical Record Number: [id number] Patient Account Number: [id number] Date of Birth/Sex: 1931-01-31 (85 y.o. M) Treating RN: Harold Barban Primary Care Provider: Burman Freestone Other Clinician: Referring Provider: Burman Freestone Treating Provider/Extender: [name], HOYT Weeks in Treatment: 10 History of Present Illness HPI Description: 02/06/18 on evaluation today patient presents for initial evaluation and our clinic concerning an issue which began roughly 3 weeks ago when the patient fell in his home on the floor in his kitchen and laid him down this detergent for roughly 3 days. He had a pressure injury to the left shoulder. This unfortunately has caused him a lot of discomfort although it finally seems to be doing better if anything is really having a lot of itching right now. This appears potentially be a contact dermatitis issue. He also has a significant pressure injury to the sacrum at this time as well which is also showing fascia exposure right over the bone but no evidence of bone exposure at this point which is good news. They have been using Santyl as well as Saline soaked gauze at this point in time. There does appear to be a lot of necrotic slough in the base of the wound. He does have a history of incontinence, myocardial infarction, and  hypertension. He also is "borderline diabetic" hemoglobin A1c of 6.0. Currently he has some discomfort in the pressure site at the sacrum but fortunately nothing too significant this did require sharp debridement today. 02/13/18 on evaluation today patient appears to be doing much better in regard to his sacral wound. He has been tolerating the dressing changes without complication with  the Vashe. Fortunately there is no evidence of infection and though there is some Slough on the surface of the wound bed he has excellent granulation noted. Overall I'm pleased with how things have progressed in that regard. A glance at his shoulder as well and the rash seems to be someone improving in my pinion at this site as well. Overall I am pleased with what we're seeing and so is the family. 02/20/18 on evaluation today patient appears to be doing a little worse in regard to the sacral wound only in the fact that there is redness surrounding it has me somewhat concerned for infection. The drainage has also apparently been a little bit off color compared to normal according to family they did keep the dressing today that was removed to show me and I agree this seems to be a little bit different compared to what we have been seeing. Coupled with the redness I'm concerned he may be developing some cellulitis surrounding the wound bed. 02/27/18 on evaluation today patient presents for follow-up concerning his sacral ulcer. We have received the results back from his wound culture which shows unfortunately that the doxycycline will not be of benefit for him I am going to need to initiate treatment with something else in order to treat the pseudomonas. Otherwise he does not seem to be having any significant pain although his daughter states there are sometimes when he states having pain. We continue to use the Vashe currently. 03/06/18 on evaluation today patient's sacral wound appears to be doing better in my opinion. He  has been tolerating the dressing changes without complication. With that being said the silver nitrate has helped with the prominent area of hyper granulation at the 6 o'clock location we will likely need to repeat this again today. Nonetheless overall I am pleased with how things have improved over the last week. The erythema surrounding the wound seems to be greatly improved. 03/13/18 on evaluation today patient's wound actually does not appear to be terribly infected although she does continue to have erythema surrounding the wound bed especially on the left border. I'm still somewhat concerned about the fact that the oral antibiotics alone may not be completely treating his infection. I previously discussed may need to go for IV antibiotic therapy I'm concerned that may be the case. We will need to make a referral today for infectious disease. 03/20/18 on evaluation today the patient sacral wound actually appears to be doing fairly well in regard to granulation although he continues unfortunately to have it your theme is surrounding the periwound region. There's also some increased swelling at the 6 o'clock location which also has me somewhat concerned. With that being said he does have some discomfort but nothing too significant at this point. He still has not heard from infectious disease his daughter and wife are both present during the office visit today they're going to check back with this again. We did get the information for them to call them today. 03/27/18 on evaluation today patient is seen concerning his ongoing sacral ulcer. He has been tolerating the dressing changes without complication. With that being said he does present with evidence of bright green drainage noted on the dressing which again is something that I do often expect to see with a pseudomonas infection. He continues to have your theme is CLARKSON, ROSSELLI ([id number]) surrounding the wound bed as well and again I'm not  100% convinced this is just pressure related. I did speak with  Colletta Maryland who is the nurse practitioner in [place] with infectious disease. I spoke with her actually yesterday concerning this patient. She is not 100% convinced that this is infected. She question whether the wound may just be colonized with Pseudomonas and not actually causing an active infection. I am really not thinking that the edema is associated with pressure alone and again overall I don't feel that your theme and is consistent with a pressure injury either as he's never had any contusions noted like a deep tissue injury on his heel which was new and I did visualize today this was on the left heel. Nonetheless she wanted to give this a little bit more time and thought it would be appropriate to start the Wound VAC at this point. 04/03/18 on evaluation today patient sacral ulcer actually appears to be doing fairly well at this point. He has been tolerating the dressing changes without complication. With that being said I'm very pleased with the progress that has been made in regard to his sacral wound over the past week I do not see as much in the way of erythema which is great news. Nonetheless he does have a small area of hyper granulation unfortunately. This is at roughly the 7 o'clock location and I think does need to be addressed so that this will heal more appropriately. Nonetheless I think we may be ready to go ahead and initiate therapy with the Wound VAC. 04/10/18 on evaluation today patient appears to be doing excellent in regard to his sacral ulcer. The show signs of great improvement in overall I'm very pleased with how things look. He has been tolerating the dressing changes without complication. Specifically this is the Wound VAC. He also seems to be doing well with the antibiotic there is decreased your theme and redness surrounding the sacral area at this point in the wound has filled in quite  significantly. 04/17/18 on evaluation today patient actually appears to be doing excellent in regard to his sacral ulcer. He's been tolerating the dressing changes without complication specifically the Wound VAC. There really are no major concerns from the patient nor family this point he is having no pain. He does have a little bit of Epiboly on the lateral portions of the wound where he does have a little bit more depth that will need to be addressed today. 04/23/18 on evaluation today patient's wound actually appears to be doing excellent at this point. He has been tolerating the Wound VAC and this appears to be doing well other than the fact that it seems to be breaking seal at the 6 o'clock location. I do believe that adding a duodenum dressing at this location try and help maintain the seal would be appropriate and likely very effective. With that being said he overall seems to be showing signs of good improvement at this point. There does not appear to be any evidence of significant infection which is also excellent news. 04/30/18 on evaluation today patient actually appears to be doing well in regard to his sacral ulcer. He's been tolerating the dressing changes without complication. Fortunately there does not appear to be any evidence of infection. Overall I'm very pleased with the progress that has been made up to this point. He does have some blistering underneath the draping unfortunately although this is definitely something that has been noted on other patients previously is a fairly common occurrence. Nonetheless the patient seems to be doing fairly well in general in my pinion based on  what I see at this time. I do believe these are fairly superficial and minor. 05/07/18 on evaluation today patient's wound actually appears to be doing excellent at this point in time. He has been tolerating the Wound VAC decently well he states that it is somewhat cumbersome to carry around unfortunately.  The only other issue he's been having according to family is that they been having a difficult time keeping the Wound VAC in place and doing what is supposed to do without making. Obviously I do think that this is definitely of concern. Nonetheless I do believe she's made good progress up to this point. I'm very happy in that regard. 05/14/18 on evaluation today patient's wound continues to make good progress at this point. He had a minimal amount of slough noted on the surface which was easily wiped away with saline and gauze and in general I feel like that he is continuing to show excellent progress even with the discontinuation of the Wound VAC. Overall I'm pleased in this regard. He was having issues with the Wound VAC in getting it to seal I think that using the Prisma at this time has been equally efficient and getting the wound to diminish in size. 05/29/18- He is here in follow up evaluation for a sacral ulcer. There is improvement, we will continue with prisma and he will follow up in two weeks 06/11/18 on evaluation today patient had unfortunately bright green drainage on the dressing upon evaluation today. This is something that we have encountered before although we were able to get things under control previously with antibiotics. With that being said currently upon further inspection of the three areas of hyper granulation that were separate from the actual wound itself which was almost healed it really appears that these all have some depth to them. They are more tunnels that really have not closed or at least have reopened as a result likely of infection in my pinion. This is definitely not what I was expecting or hoping for. ROLLYN, SCIALDONE ([id number]) 06/26/18 on evaluation today patient continues to experience issues with what appears to be small abscesses in the sacral region unfortunately. With that being said he has been tolerating the dressing changes without complication  which is good news. He's not having any significant discomfort which is also good news. With that being said his daughter states that after he left last week that the packing that we have placed fell out quite rapidly and he subsequently healed over very quick to the point they were not able to even repack the regions. Nonetheless there appear to be several fluctuance areas noted at this point there's one central region that does seem to be draining still discharge that is somewhat green in color. I did review the results of the wound culture which did show evidence of infection with both MRSA as well as pseudomonas based on that result. Nonetheless again with his other current medications we are not able to do the Cipro due to issues with potential long QT syndrome. I am going to give him a prescription for doxycycline in order to help with the potential MRSA infection. 07/09/18 on evaluation today patient actually appears to be doing about the same in regard to his sacral wound. He actually has his MRI scheduled for tomorrow and then subsequently is going to be having his infectious disease appointment for Thursday of this week. Fortunately he's not having any significant discomfort he still has your theme in the sacral  region he still has several blister/flux went areas although they technically are not blisters this is more like a underlying abscess. The one area that is open still does probe down to bone. Again I am concerned about a deeper infection possibly even sacral osteomyelitis. 07/23/18 on evaluation today patient actually appears to be doing very well all things considered in regard to his sacral ulcer. Since I've last seen him he actually did have his MRI performed which showed that he has a complex fluid collection superficial to the distal sacrum measuring 5.9 x 4.4 x 2.8 cm which abuts the posterior aspect of the distal sacrum and the sacrum itself shows cortical destruction  consistent with osteomyelitis. She has also been seen by infectious disease and currently orders have been initiated for IV antibiotic therapy for the next eight weeks. He was seen by Janene Madeira NP and placed on Ceftazidime and Daptomycin. Currently he has not really been on this quite long enough to see a sufficient response to the new orders as far as antibiotic therapy is concerned. Nonetheless it does appear that he is likely on the right track at this point which is great news. Nonetheless the question which both Colletta Maryland and myself have discussed both with the patient and between ourselves is whether or not the patient may need to be seen by surgeon for surgical evacuation of the fluid collection/abscess and possible debridement of the sacrum itself. Nonetheless at this time my personal opinion is probably gonna be that we wait and get this at least a couple weeks to see the response that he receives with the IV antibiotic therapy. 08/06/18 on evaluation today patient actually appears to be doing rather well in regard to his sacral ulcer region all things considered. He has been tolerating the dressing changes without complication. With that being said there is not any obvious opening nor any drainage noted at this point in time upon evaluation. The patient has been tolerating the dressing changes though. He's doing well with the IV antibiotic therapy. 08/20/18 on evaluation today patient appears to be doing wonderful in regard to his sacral region. In fact there appears to be no wound opening or drainage at this time. Overall I'm very pleased with how things have progressed. The patient likewise as well as his wife and daughter are very happy as well. Readmission: 11/20/18 on evaluation today patient presents for reevaluation our clinic concerning issues with his sacral region. Unfortunately the area in question is the same region which we previously treated what we thought would  successfully back in November 2019. At that time the patient underwent IV antibiotic therapy which seemed to do the job very well. Subsequently however in the past couple of weeks he has begun to have drainage and bleeding from the sacral region and is having increased pain yet again. Fortunately there is no signs of systemic infection although it does appear that the complex abscess that was previously noted may not have fully cleared there was a question between myself as well is infectious disease previous whether not he needed to see a surgeon being that things got better the family opted not to see a surgeon at that time. Nonetheless I am concerned that he may the need to see a surgeon in order to have this area surgically debrided and possibly a bone culture obtained in order to get a better idea of what we're treating and ensure that this is able to completely and fully heal. 11/27/18 on evaluation today  patient appears to be doing about the same in regard to his sacral ulcer. He did see Dr. Lysle Pearl yesterday and the plan is to proceed forward with surgery to clean out the region. This seems to be the most appropriate goal of treatment at this point. Dr. Lysle Pearl just needed to speak with Dr. Ellene Route the patient's neurologist in order to get clearance as the patient was supposed to be scheduled for a carpal tunnel and elbow surgery with Dr. Ellene Route. Nonetheless he has apparently given clearance to proceed with this surgery for the sacral region which is deemed more important at this point. Unfortunately the patient had a little bit of increased pain although he is having redness at this point there does not appear to be any spreading infection which is good news. No fevers, chills, nausea, or vomiting noted at this time. TERRIAN, SENTELL ([id number]) 12/03/18 on evaluation today patient actually appears to be doing about the same in regard to the sacral ulcer. He still waiting to hear back from Dr.  Ines Bloomer office in regard to scheduling his surgery. Fortunately there's no signs of systemic infection at this time which is good news. Unfortunately he really does not seem to making any progress the doxycycline does seem to at least be beneficial in helping to hold off the infection from worsening although unfortunately he still has a lot going on in this regard. Patient's daughter states she's actually gonna contact her office tomorrow to see what exactly is going on. 12/10/18 on evaluation today patient actually appears to be doing about the same in regard to her sacral room. Apparently he still waiting on approval from both cardiology as well as Dr. Ellene Route although apparently he's Artie gotten a formal letter from Dr. Ellene Route stated that he was clear to proceed with the sacral surgery. Di Kindle actually found the clearance letter from cardiology in epic as well today and this subsequently was given to the patient's daughter as that seems to be the only thing that was holding up proceeding with the surgery. Hopefully should be able to take that over to the surgeon's office today in order to go ahead and get this scheduled. 12/24/18 on evaluation today patient actually appears to be doing very well in regard to his sacral ulcer compared to when I last saw him. He has had surgery Dr. Lysle Pearl and it does appear that he was able to clear out this area of abscess very well without complication. This did extend down to the bone and a portion of the bone was sent for pathology and culture. Although on the pathology report it appears this is more tissue and not bone noted. With that being said the culture from the bone and Henrene Pastor asked him this was obtained revealed Corynebacterium striatum with no anaerobes isolated. Dr. Lysle Pearl did not place the patient on the antibiotics at this point. Again I have been debating on whether or not this is the patient to infectious disease I think that I am going to do that at  this point to gain their opinion on whether or not there's anything we should be treated in this regard and if so will be the best treatment options. 12/31/18 on evaluation today patient actually appears to be doing okay in regard to his sacral wound. Fortunately there does not appear to be any evidence of infection at this time which is good news. He has been tolerating the dressing's without complication. With that being said he did see infectious disease and  apparently they realize that the pathology samples sent from the operating room was actually. I'll see him and not bone and the question is whether the culture and sample or just contamination from the skin of not guilty and active infection. The daughter is very concerned about this she states that we may need to get a piece of bone to send for examination to ensure there's nothing more severe going on here. Obviously with this history of healing and then obsesses forming and then having to go for surgery now and reopening everything they have a reason to be concerned I completely agree. 01/07/19 on evaluation today patient actually appears to be doing well in regard to his sacral wound. Again we did get results back of his bone culture as well as the pathology which showed no evidence of osteomyelitis at this point this is excellent news. Overall I think that he likely does not need to go forward with the MRI since everything seems to be showing negative at this time. Especially in light of the fact that the wound appears to be doing well and that the infection and everything was running the wound bed seems to be dramatically improved. Patient's daughter is in agreement with this plan. 01/14/19 on evaluation today patient appears to be doing rather well at this time as far as the overall appearance of the sacral wound is concerned. The depth is slightly improved he has some granulation tissue noted there still is bone in the very bottom of the  wound bed although I'm no longer able to palpate this with my finger I can only tell by probing with the sterile cotton swab. Nonetheless I do believe this is shown signs of good improvement in healing as far as what I'm seeing at this point. 01/22/19 on evaluation today patient's wound in the sacral region appears to be doing rather well. Fortunately there does not seem to be any evidence of active infection at this time which is good news but patient overall has been doing excellent currently. I'm very pleased with how things have progressed over the past week. 01/28/19 on evaluation today patient actually appears to be doing very well in regard to his sacral wound. The area where the bone was exposed is closing in on becoming much more solid which is excellent news there does not appear to be any signs of active infection at this time also good news. Overall very pleased with how everything seems to be progressing. 02/04/19 on evaluation today patient appears to be doing much better in regard to the sacral wound. He has been tolerating the dressing changes without complication and though he is making some progress there's actually some hyper granulation noted at this point. I do think he may benefit from a dressing change we been using the Prisma with the alginate pack behind for some time I think we may want to switch to [facility] Dressing to see if this could be of benefit. Electronic Signature(s) Signed: 02/04/2019 11:56:08 PM By: Molli Barrows ([id number]) Entered By: Worthy Keeler on 02/04/2019 14:26:59 [name] ([id number]) -------------------------------------------------------------------------------- Physical Exam Details Patient Name: TYREE, TALATI Date of Service: 02/04/2019 12:30 PM Medical Record Number: [id number] Patient Account Number: [id number] Date of Birth/Sex: 1931/09/09 (85 y.o. M) Treating RN: Harold Barban Primary Care Provider:  Burman Freestone Other Clinician: Referring Provider: Burman Freestone Treating Provider/Extender: STONE III, HOYT Weeks in Treatment: 10 Constitutional Well-nourished and well-hydrated in no acute distress. Respiratory  normal breathing without difficulty. clear to auscultation bilaterally. Cardiovascular regular rate and rhythm with normal S1, S2. Psychiatric this patient is able to make decisions and demonstrates good insight into disease process. Alert and Oriented x 3. pleasant and cooperative. Notes Patient's wound bed currently did require sharp debridement and I was able to perform this today without any significant complications post debridement the wound bed appears to be doing much better. There still is bone noted in the base of the wound probing obviously I cannot see this. Electronic Signature(s) Signed: 02/04/2019 11:56:08 PM By: Worthy Keeler PA-C Entered By: Worthy Keeler on 02/04/2019 14:27:30 [name] ([id number]) -------------------------------------------------------------------------------- Physician Orders Details Patient Name: DAMEK, BRAZILE Date of Service: 02/04/2019 12:30 PM Medical Record Number: [id number] Patient Account Number: [id number] Date of Birth/Sex: [birth year] (85 y.o. M) Treating RN: Harold Barban Primary Care Provider: Burman Freestone Other Clinician: Referring Provider: Burman Freestone Treating Provider/Extender: [name], HOYT Weeks in Treatment: 10 Verbal / Phone Orders: No Diagnosis Coding ICD-10 Coding Code Description L89.154 Pressure ulcer of sacral region, stage 4 L02.31 Cutaneous abscess of buttock Wound Cleansing Wound #2 Midline Sacrum o Clean wound with Normal Saline. o May Shower, gently pat wound dry prior to applying new dressing. Anesthetic (add to Medication List) Wound #2 Midline Sacrum o Topical Lidocaine 4% cream applied to wound bed prior to debridement (In Clinic Only). Primary Wound  Dressing Wound #2 Midline Sacrum o Hydrafera Blue Ready Transfer Secondary Dressing Wound #2 Midline Sacrum o Boardered Foam Dressing Dressing Change Frequency Wound #2 Midline Sacrum o Change dressing every other day. Follow-up Appointments Wound #2 Midline Sacrum o Return Appointment in 1 week. Off-Loading Wound #2 Midline Sacrum o Turn and reposition every 2 hours Electronic Signature(s) Signed: 02/04/2019 11:56:08 PM By: Worthy Keeler PA-C Signed: 06/14/2019 1:03:35 PM By: Harold Barban Entered By: Harold Barban on 02/04/2019 12:57:35 OMARIEN, TORR ([id number]) MELQUISEDEC, KROMAH ([id number]) -------------------------------------------------------------------------------- Problem List Details Patient Name: RYN, SKUBAL Date of Service: 02/04/2019 12:30 PM Medical Record Number: [id number] Patient Account Number: [id number] Date of Birth/Sex: 1931-04-25 (85 y.o. 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 M) Treating RN: Harold Barban Primary Care Provider: Burman Freestone Other Clinician: Referring Provider: Burman Freestone Treating Provider/Extender: [name], HOYT Weeks in Treatment: 10 Subjective Chief Complaint Information obtained from Patient Sacral pressure ulcer History of Present Illness (HPI) 02/06/18 on evaluation today patient presents for initial evaluation and our clinic concerning an issue which began roughly 3 weeks ago when the patient fell in his home on the floor in his kitchen and laid him down this detergent for roughly 3 days. He had a pressure injury to the left shoulder. This unfortunately has caused him a lot of discomfort although it finally seems to be doing better if anything is really having a lot of itching right now. This appears potentially be a contact dermatitis  issue. He also has a significant pressure injury to the sacrum at this time as well which is also showing fascia exposure right over the bone but no evidence of bone exposure at this point which is good news. They have been using Santyl as well as Saline soaked gauze at this point in time. There does appear to be a lot of necrotic slough in the base of the wound. He does have a history of incontinence, myocardial infarction, and hypertension. He also is "borderline diabetic" hemoglobin A1c of 6.0. Currently he has some discomfort in the pressure site at the sacrum but fortunately nothing too significant this did require sharp debridement today. 02/13/18 on evaluation today patient appears to be doing much better in regard to his sacral wound. He has been tolerating the dressing changes without complication with the Vashe. Fortunately there is no evidence of infection and though there is some Slough on the surface of the wound bed he has excellent granulation noted. Overall I'm pleased with how things have progressed in that regard. A glance at his shoulder as well and the rash seems to be someone improving in my pinion at this site as well. Overall I am pleased with what we're seeing and so is the family. 02/20/18  on evaluation today patient appears to be doing a little worse in regard to the sacral wound only in the fact that there is redness surrounding it has me somewhat concerned for infection. The drainage has also apparently been a little bit off color compared to normal according to family they did keep the dressing today that was removed to show me and I agree this seems to be a little bit different compared to what we have been seeing. Coupled with the redness I'm concerned he may be developing some cellulitis surrounding the wound bed. 02/27/18 on evaluation today patient presents for follow-up concerning his sacral ulcer. We have received the results back from his wound culture which shows unfortunately that the doxycycline will not be of benefit for him I am going to need to initiate treatment with something else in order to treat the pseudomonas. Otherwise he does not seem to be having any significant pain although his daughter states there are sometimes when he states having pain. We continue to use the Vashe currently. 03/06/18 on evaluation today patient's sacral wound appears to be doing better in my opinion. He has been tolerating the dressing changes without complication. With that being said the silver nitrate has helped with the prominent area of hyper granulation at the 6 o'clock location we will likely need to repeat this again today. Nonetheless overall I am pleased with how things have improved over the last week. The erythema surrounding the wound seems to be greatly improved. 03/13/18 on evaluation today patient's wound actually does not appear to be terribly infected although she does continue to have erythema surrounding the wound bed especially on the left border. I'm still somewhat concerned about the fact that the oral antibiotics alone may not be completely treating his infection. I previously discussed may need to go for IV antibiotic therapy I'm concerned that may be the case.  We will need to make a referral today for infectious disease. 03/20/18 on evaluation today the patient sacral wound actually appears to be doing fairly well in regard to granulation although he continues unfortunately to have it your theme is surrounding the periwound region. There's also some increased swelling at the 6 o'clock location which also has me somewhat  concerned. With that being said he does have some discomfort but PINCHOS, CORDOVES. ([id number]) nothing too significant at this point. He still has not heard from infectious disease his daughter and wife are both present during the office visit today they're going to check back with this again. We did get the information for them to call them today. 03/27/18 on evaluation today patient is seen concerning his ongoing sacral ulcer. He has been tolerating the dressing changes without complication. With that being said he does present with evidence of bright green drainage noted on the dressing which again is something that I do often expect to see with a pseudomonas infection. He continues to have your theme is surrounding the wound bed as well and again I'm not 100% convinced this is just pressure related. I did speak with Colletta Maryland who is the nurse practitioner in [place] with infectious disease. I spoke with her actually yesterday concerning this patient. She is not 100% convinced that this is infected. She question whether the wound may just be colonized with Pseudomonas and not actually causing an active infection. I am really not thinking that the edema is associated with pressure alone and again overall I don't feel that your theme and is consistent with a pressure injury either as he's never had any contusions noted like a deep tissue injury on his heel which was new and I did visualize today this was on the left heel. Nonetheless she wanted to give this a little bit more time and thought it would be appropriate to start the Wound VAC  at this point. 04/03/18 on evaluation today patient sacral ulcer actually appears to be doing fairly well at this point. He has been tolerating the dressing changes without complication. With that being said I'm very pleased with the progress that has been made in regard to his sacral wound over the past week I do not see as much in the way of erythema which is great news. Nonetheless he does have a small area of hyper granulation unfortunately. This is at roughly the 7 o'clock location and I think does need to be addressed so that this will heal more appropriately. Nonetheless I think we may be ready to go ahead and initiate therapy with the Wound VAC. 04/10/18 on evaluation today patient appears to be doing excellent in regard to his sacral ulcer. The show signs of great improvement in overall I'm very pleased with how things look. He has been tolerating the dressing changes without complication. Specifically this is the Wound VAC. He also seems to be doing well with the antibiotic there is decreased your theme and redness surrounding the sacral area at this point in the wound has filled in quite significantly. 04/17/18 on evaluation today patient actually appears to be doing excellent in regard to his sacral ulcer. He's been tolerating the dressing changes without complication specifically the Wound VAC. There really are no major concerns from the patient nor family this point he is having no pain. He does have a little bit of Epiboly on the lateral portions of the wound where he does have a little bit more depth that will need to be addressed today. 04/23/18 on evaluation today patient's wound actually appears to be doing excellent at this point. He has been tolerating the Wound VAC and this appears to be doing well other than the fact that it seems to be breaking seal at the 6 o'clock location. I do believe that adding a duodenum dressing at this  location try and help maintain the seal would be  appropriate and likely very effective. With that being said he overall seems to be showing signs of good improvement at this point. There does not appear to be any evidence of significant infection which is also excellent news. 04/30/18 on evaluation today patient actually appears to be doing well in regard to his sacral ulcer. He's been tolerating the dressing changes without complication. Fortunately there does not appear to be any evidence of infection. Overall I'm very pleased with the progress that has been made up to this point. He does have some blistering underneath the draping unfortunately although this is definitely something that has been noted on other patients previously is a fairly common occurrence. Nonetheless the patient seems to be doing fairly well in general in my pinion based on what I see at this time. I do believe these are fairly superficial and minor. 05/07/18 on evaluation today patient's wound actually appears to be doing excellent at this point in time. He has been tolerating the Wound VAC decently well he states that it is somewhat cumbersome to carry around unfortunately. The only other issue he's been having according to family is that they been having a difficult time keeping the Wound VAC in place and doing what is supposed to do without making. Obviously I do think that this is definitely of concern. Nonetheless I do believe she's made good progress up to this point. I'm very happy in that regard. 05/14/18 on evaluation today patient's wound continues to make good progress at this point. He had a minimal amount of slough noted on the surface which was easily wiped away with saline and gauze and in general I feel like that he is continuing to show excellent progress even with the discontinuation of the Wound VAC. Overall I'm pleased in this regard. He was having issues with the Wound VAC in getting it to seal I think that using the Prisma at this time has been equally  efficient and getting the wound to diminish in size. 05/29/18- He is here in follow up evaluation for a sacral ulcer. There is improvement, we will continue with prisma and he will follow up in two weeks VICTORIO, ABBE ([id number]) 06/11/18 on evaluation today patient had unfortunately bright green drainage on the dressing upon evaluation today. This is something that we have encountered before although we were able to get things under control previously with antibiotics. With that being said currently upon further inspection of the three areas of hyper granulation that were separate from the actual wound itself which was almost healed it really appears that these all have some depth to them. They are more tunnels that really have not closed or at least have reopened as a result likely of infection in my pinion. This is definitely not what I was expecting or hoping for. 06/26/18 on evaluation today patient continues to experience issues with what appears to be small abscesses in the sacral region unfortunately. With that being said he has been tolerating the dressing changes without complication which is good news. He's not having any significant discomfort which is also good news. With that being said his daughter states that after he left last week that the packing that we have placed fell out quite rapidly and he subsequently healed over very quick to the point they were not able to even repack the regions. Nonetheless there appear to be several fluctuance areas noted at this point there's one central region  that does seem to be draining still discharge that is somewhat green in color. I did review the results of the wound culture which did show evidence of infection with both MRSA as well as pseudomonas based on that result. Nonetheless again with his other current medications we are not able to do the Cipro due to issues with potential long QT syndrome. I am going to give him a prescription  for doxycycline in order to help with the potential MRSA infection. 07/09/18 on evaluation today patient actually appears to be doing about the same in regard to his sacral wound. He actually has his MRI scheduled for tomorrow and then subsequently is going to be having his infectious disease appointment for Thursday of this week. Fortunately he's not having any significant discomfort he still has your theme in the sacral region he still has several blister/flux went areas although they technically are not blisters this is more like a underlying abscess. The one area that is open still does probe down to bone. Again I am concerned about a deeper infection possibly even sacral osteomyelitis. 07/23/18 on evaluation today patient actually appears to be doing very well all things considered in regard to his sacral ulcer. Since I've last seen him he actually did have his MRI performed which showed that he has a complex fluid collection superficial to the distal sacrum measuring 5.9 x 4.4 x 2.8 cm which abuts the posterior aspect of the distal sacrum and the sacrum itself shows cortical destruction consistent with osteomyelitis. She has also been seen by infectious disease and currently orders have been initiated for IV antibiotic therapy for the next eight weeks. He was seen by Janene Madeira NP and placed on Ceftazidime and Daptomycin. Currently he has not really been on this quite long enough to see a sufficient response to the new orders as far as antibiotic therapy is concerned. Nonetheless it does appear that he is likely on the right track at this point which is great news. Nonetheless the question which both Colletta Maryland and myself have discussed both with the patient and between ourselves is whether or not the patient may need to be seen by surgeon for surgical evacuation of the fluid collection/abscess and possible debridement of the sacrum itself. Nonetheless at this time my personal opinion  is probably gonna be that we wait and get this at least a couple weeks to see the response that he receives with the IV antibiotic therapy. 08/06/18 on evaluation today patient actually appears to be doing rather well in regard to his sacral ulcer region all things considered. He has been tolerating the dressing changes without complication. With that being said there is not any obvious opening nor any drainage noted at this point in time upon evaluation. The patient has been tolerating the dressing changes though. He's doing well with the IV antibiotic therapy. 08/20/18 on evaluation today patient appears to be doing wonderful in regard to his sacral region. In fact there appears to be no wound opening or drainage at this time. Overall I'm very pleased with how things have progressed. The patient likewise as well as his wife and daughter are very happy as well. Readmission: 11/20/18 on evaluation today patient presents for reevaluation our clinic concerning issues with his sacral region. Unfortunately the area in question is the same region which we previously treated what we thought would successfully back in November 2019. At that time the patient underwent IV antibiotic therapy which seemed to do the job very well.  Subsequently however in the past couple of weeks he has begun to have drainage and bleeding from the sacral region and is having increased pain yet again. Fortunately there is no signs of systemic infection although it does appear that the complex abscess that was previously noted may not have fully cleared there was a question between myself as well is infectious disease previous whether not he needed to see a surgeon being that things got better the family opted not to see a surgeon at that time. Nonetheless I am concerned that he may the need to see a surgeon in order to have this area surgically debrided and possibly a bone culture obtained in order to get a better idea of what  we're treating and ensure that this is able to completely and fully heal. 11/27/18 on evaluation today patient appears to be doing about the same in regard to his sacral ulcer. He did see Dr. Viviano Simas, [name] ([id number]) yesterday and the plan is to proceed forward with surgery to clean out the region. This seems to be the most appropriate goal of treatment at this point. Dr. Lysle Pearl just needed to speak with Dr. Ellene Route the patient's neurologist in order to get clearance as the patient was supposed to be scheduled for a carpal tunnel and elbow surgery with Dr. Ellene Route. Nonetheless he has apparently given clearance to proceed with this surgery for the sacral region which is deemed more important at this point. Unfortunately the patient had a little bit of increased pain although he is having redness at this point there does not appear to be any spreading infection which is good news. No fevers, chills, nausea, or vomiting noted at this time. 12/03/18 on evaluation today patient actually appears to be doing about the same in regard to the sacral ulcer. He still waiting to hear back from Dr. Ines Bloomer office in regard to scheduling his surgery. Fortunately there's no signs of systemic infection at this time which is good news. Unfortunately he really does not seem to making any progress the doxycycline does seem to at least be beneficial in helping to hold off the infection from worsening although unfortunately he still has a lot going on in this regard. Patient's daughter states she's actually gonna contact her office tomorrow to see what exactly is going on. 12/10/18 on evaluation today patient actually appears to be doing about the same in regard to her sacral room. Apparently he still waiting on approval from both cardiology as well as Dr. Ellene Route although apparently he's Artie gotten a formal letter from Dr. Ellene Route stated that he was clear to proceed with the sacral surgery. Di Kindle actually found  the clearance letter from cardiology in epic as well today and this subsequently was given to the patient's daughter as that seems to be the only thing that was holding up proceeding with the surgery. Hopefully should be able to take that over to the surgeon's office today in order to go ahead and get this scheduled. 12/24/18 on evaluation today patient actually appears to be doing very well in regard to his sacral ulcer compared to when I last saw him. He has had surgery Dr. Lysle Pearl and it does appear that he was able to clear out this area of abscess very well without complication. This did extend down to the bone and a portion of the bone was sent for pathology and culture. Although on the pathology report it appears this is more tissue and not bone noted. With that  being said the culture from the bone and Henrene Pastor asked him this was obtained revealed Corynebacterium striatum with no anaerobes isolated. Dr. Lysle Pearl did not place the patient on the antibiotics at this point. Again I have been debating on whether or not this is the patient to infectious disease I think that I am going to do that at this point to gain their opinion on whether or not there's anything we should be treated in this regard and if so will be the best treatment options. 12/31/18 on evaluation today patient actually appears to be doing okay in regard to his sacral wound. Fortunately there does not appear to be any evidence of infection at this time which is good news. He has been tolerating the dressing's without complication. With that being said he did see infectious disease and apparently they realize that the pathology samples sent from the operating room was actually. I'll see him and not bone and the question is whether the culture and sample or just contamination from the skin of not guilty and active infection. The daughter is very concerned about this she states that we may need to get a piece of bone to send for examination  to ensure there's nothing more severe going on here. Obviously with this history of healing and then obsesses forming and then having to go for surgery now and reopening everything they have a reason to be concerned I completely agree. 01/07/19 on evaluation today patient actually appears to be doing well in regard to his sacral wound. Again we did get results back of his bone culture as well as the pathology which showed no evidence of osteomyelitis at this point this is excellent news. Overall I think that he likely does not need to go forward with the MRI since everything seems to be showing negative at this time. Especially in light of the fact that the wound appears to be doing well and that the infection and everything was running the wound bed seems to be dramatically improved. Patient's daughter is in agreement with this plan. 01/14/19 on evaluation today patient appears to be doing rather well at this time as far as the overall appearance of the sacral wound is concerned. The depth is slightly improved he has some granulation tissue noted there still is bone in the very bottom of the wound bed although I'm no longer able to palpate this with my finger I can only tell by probing with the sterile cotton swab. Nonetheless I do believe this is shown signs of good improvement in healing as far as what I'm seeing at this point. 01/22/19 on evaluation today patient's wound in the sacral region appears to be doing rather well. Fortunately there does not seem to be any evidence of active infection at this time which is good news but patient overall has been doing excellent currently. I'm very pleased with how things have progressed over the past week. 01/28/19 on evaluation today patient actually appears to be doing very well in regard to his sacral wound. The area where the bone was exposed is closing in on becoming much more solid which is excellent news there does not appear to be any signs of active  infection at this time also good news. Overall very pleased with how everything seems to be progressing. 02/04/19 on evaluation today patient appears to be doing much better in regard to the sacral wound. He has been tolerating the dressing changes without complication and though he is making some progress  there's actually some hyper granulation noted at this point. I do think he may benefit from a dressing change we been using the Prisma with the alginate pack behind ORYON, BRUGGEMAN. ([id number]) for some time I think we may want to switch to [facility] Dressing to see if this could be of benefit. Patient History Information obtained from Patient. Family History Cancer - Paternal Grandparents, Diabetes - Father, Heart Disease - Mother,Father, Stroke - Father, No family history of Hypertension, Kidney Disease, Lung Disease, Seizures, Thyroid Problems, Tuberculosis. Social History Never smoker, Marital Status - Widowed, Alcohol Use - Never, Drug Use - No History, Caffeine Use - Daily. Medical History Eyes Patient has history of Cataracts - bilateral removal Denies history of Glaucoma, Optic Neuritis Ear/Nose/Mouth/Throat Denies history of Chronic sinus problems/congestion, Middle ear problems Hematologic/Lymphatic Denies history of Anemia, Hemophilia, Human Immunodeficiency Virus, Lymphedema, Sickle Cell Disease Respiratory Patient has history of Asthma Denies history of Aspiration, Chronic Obstructive Pulmonary Disease (COPD), Pneumothorax, Sleep Apnea, Tuberculosis Cardiovascular Patient has history of Angina, Arrhythmia, Coronary Artery Disease, Hypertension, Myocardial Infarction - 2001 Denies history of Congestive Heart Failure, Deep Vein Thrombosis, Hypotension, Peripheral Arterial Disease, Peripheral Venous Disease, Phlebitis, Vasculitis Gastrointestinal Denies history of Cirrhosis , Colitis, Crohn s, Hepatitis A, Hepatitis B, Hepatitis C Endocrine Denies history of Type I  Diabetes, Type II Diabetes Genitourinary Denies history of End Stage Renal Disease Immunological Denies history of Lupus Erythematosus, Raynaud s, Scleroderma Integumentary (Skin) Denies history of History of Burn, History of pressure wounds Musculoskeletal Patient has history of Osteoarthritis Denies history of Gout, Rheumatoid Arthritis, Osteomyelitis Neurologic Patient has history of Dementia, Neuropathy Denies history of Quadriplegia, Paraplegia, Seizure Disorder Oncologic Denies history of Received Chemotherapy, Received Radiation Psychiatric Denies history of Anorexia/bulimia, Confinement Anxiety Hospitalization/Surgery History - Fall. Medical And Surgical History Notes Endocrine Borderline Oncologic Melanoma on back Review of Systems (ROS) Constitutional Symptoms (General Health) Denies complaints or symptoms of Fatigue, Fever, Chills, Marked Weight Change. TILFORD, MOLLINEDO ([id number]) Respiratory Denies complaints or symptoms of Chronic or frequent coughs, Shortness of Breath. Cardiovascular Denies complaints or symptoms of Chest pain, LE edema. Psychiatric Denies complaints or symptoms of Anxiety, Claustrophobia. Objective Constitutional Well-nourished and well-hydrated in no acute distress. Vitals Time Taken: 12:40 PM, Height: 69 in, Weight: 170.6 lbs, BMI: 25.2, Temperature: 98.3 F, Pulse: 60 bpm, Respiratory Rate: 16 breaths/min, Blood Pressure: 155/67 mmHg. Respiratory normal breathing without difficulty. clear to auscultation bilaterally. Cardiovascular regular rate and rhythm with normal S1, S2. Psychiatric this patient is able to make decisions and demonstrates good insight into disease process. Alert and Oriented x 3. pleasant and cooperative. General Notes: Patient's wound bed currently did require sharp debridement and I was able to perform this today without any significant complications post debridement the wound bed appears to be doing much  better. There still is bone noted in the base of the wound probing obviously I cannot see this. Integumentary (Hair, Skin) Wound #2 status is Open. Original cause of wound was Gradually Appeared. The wound is located on the Midline Sacrum. The wound measures 2.4cm length x 0.8cm width x 1cm depth; 1.508cm^2 area and 1.508cm^3 volume. There is Fat Layer (Subcutaneous Tissue) Exposed exposed. There is no tunneling or undermining noted. There is a medium amount of purulent drainage noted. The wound margin is indistinct and nonvisible. There is small (1-33%) pale, hyper - granulation within the wound bed. There is a large (67-100%) amount of necrotic tissue within the wound bed including Adherent Slough. The periwound skin appearance exhibited:  Scarring, Erythema. The periwound skin appearance did not exhibit: Callus, Crepitus, Excoriation, Induration, Rash, Dry/Scaly, Maceration, Atrophie Blanche, Cyanosis, Ecchymosis, Hemosiderin Staining, Mottled, Pallor, Rubor. The surrounding wound skin color is noted with erythema which is circumferential. Periwound temperature was noted as No Abnormality. The periwound has tenderness on palpation. Assessment Active Problems ICD-10 MOHSEN, HELMER ([id number]) Pressure ulcer of sacral region, stage 4 Cutaneous abscess of buttock Procedures Wound #2 Pre-procedure diagnosis of Wound #2 is a Pressure Ulcer located on the Midline Sacrum . There was a Excisional Skin/Subcutaneous Tissue Debridement with a total area of 1.92 sq cm performed by STONE III, HOYT E., PA-C. With the following instrument(s): Curette to remove Viable and Non-Viable tissue/material. Material removed includes Subcutaneous Tissue and Slough and after achieving pain control using Lidocaine. No specimens were taken. A time out was conducted at 12:55, prior to the start of the procedure. A Moderate amount of bleeding was controlled with Pressure. The procedure was tolerated well with a pain  level of 0 throughout and a pain level of 0 following the procedure. Post Debridement Measurements: 2.4cm length x 0.8cm width x 1cm depth; 1.508cm^3 volume. Post debridement Stage noted as Category/Stage IV. Character of Wound/Ulcer Post Debridement is improved. Post procedure Diagnosis Wound #2: Same as Pre-Procedure Plan Wound Cleansing: Wound #2 Midline Sacrum: Clean wound with Normal Saline. May Shower, gently pat wound dry prior to applying new dressing. Anesthetic (add to Medication List): Wound #2 Midline Sacrum: Topical Lidocaine 4% cream applied to wound bed prior to debridement (In Clinic Only). Primary Wound Dressing: Wound #2 Midline Sacrum: Hydrafera Blue Ready Transfer Secondary Dressing: Wound #2 Midline Sacrum: Boardered Foam Dressing Dressing Change Frequency: Wound #2 Midline Sacrum: Change dressing every other day. Follow-up Appointments: Wound #2 Midline Sacrum: Return Appointment in 1 week. Off-Loading: Wound #2 Midline Sacrum: Turn and reposition every 2 hours My suggestion at this point is gonna be that we go ahead and continue with the above wound care measures for the next week and the patient is in agreement with that plan. We will subsequently see were things that at follow-up. If anything LOUDON, KONDA ([id number]) changes or worsens meantime he will contact the office and let me know or have his daughter do so. Otherwise we will see where the wound stands at follow-up. Please see above for specific wound care orders. We will see patient for re-evaluation in 1 week(s) here in the clinic. If anything worsens or changes patient will contact our office for additional recommendations. Electronic Signature(s) Signed: 02/04/2019 11:56:08 PM By: Worthy Keeler PA-C Entered By: Worthy Keeler on 02/04/2019 14:27:57 [name] ([id number]) -------------------------------------------------------------------------------- ROS/PFSH Details Patient  Name: [name] Date of Service: 02/04/2019 12:30 PM Medical Record Number: [id number] Patient Account Number: [id number] Date of Birth/Sex: [birth year] (85 y.o. M) Treating RN: Harold Barban Primary Care Provider: Burman Freestone Other Clinician: Referring Provider: Burman Freestone Treating Provider/Extender: [name], HOYT Weeks in Treatment: 10 Information Obtained From Patient Constitutional Symptoms (General Health) Complaints and Symptoms: Negative for: Fatigue; Fever; Chills; Marked Weight Change Respiratory Complaints and Symptoms: Negative for: Chronic or frequent coughs; Shortness of Breath Medical History: Positive for: Asthma Negative for: Aspiration; Chronic Obstructive Pulmonary Disease (COPD); Pneumothorax; Sleep Apnea; Tuberculosis Cardiovascular Complaints and Symptoms: Negative for: Chest pain; LE edema Medical History: Positive for: Angina; Arrhythmia; Coronary Artery Disease; Hypertension; Myocardial Infarction - 2001 Negative for: Congestive Heart Failure; Deep Vein Thrombosis; Hypotension; Peripheral Arterial Disease; Peripheral Venous Disease; Phlebitis; Vasculitis Psychiatric Complaints and Symptoms:  Negative for: Anxiety; Claustrophobia Medical History: Negative for: Anorexia/bulimia; Confinement Anxiety Eyes Medical History: Positive for: Cataracts - bilateral removal Negative for: Glaucoma; Optic Neuritis Ear/Nose/Mouth/Throat Medical History: Negative for: Chronic sinus problems/congestion; Middle ear problems Hematologic/Lymphatic Medical History: Negative for: Anemia; Hemophilia; Human Immunodeficiency Virus; Lymphedema; Sickle Cell Disease COURTLAND, LOTZ ([id number]) Gastrointestinal Medical History: Negative for: Cirrhosis ; Colitis; Crohnos; Hepatitis A; Hepatitis B; Hepatitis C Endocrine Medical History: Negative for: Type I Diabetes; Type II Diabetes Past Medical History Notes: Borderline Genitourinary Medical  History: Negative for: End Stage Renal Disease Immunological Medical History: Negative for: Lupus Erythematosus; Raynaudos; Scleroderma Integumentary (Skin) Medical History: Negative for: History of Burn; History of pressure wounds Musculoskeletal Medical History: Positive for: Osteoarthritis Negative for: Gout; Rheumatoid Arthritis; Osteomyelitis Neurologic Medical History: Positive for: Dementia; Neuropathy Negative for: Quadriplegia; Paraplegia; Seizure Disorder Oncologic Medical History: Negative for: Received Chemotherapy; Received Radiation Past Medical History Notes: Melanoma on back HBO Extended History Items Eyes: Cataracts Immunizations Pneumococcal Vaccine: Received Pneumococcal Vaccination: Yes Implantable Devices No devices added Hospitalization / Surgery History Type of Hospitalization/Surgery JAZZ, GALE ([id number]) Fall Family and Social History Cancer: Yes - Paternal Grandparents; Diabetes: Yes - Father; Heart Disease: Yes - Mother,Father; Hypertension: No; Kidney Disease: No; Lung Disease: No; Seizures: No; Stroke: Yes - Father; Thyroid Problems: No; Tuberculosis: No; Never smoker; Marital Status - Widowed; Alcohol Use: Never; Drug Use: No History; Caffeine Use: Daily; Financial Concerns: No; Food, Clothing or Shelter Needs: No; Support System Lacking: No; Transportation Concerns: No Electronic Signature(s) Signed: 02/04/2019 11:56:08 PM By: Worthy Keeler PA-C Signed: 06/14/2019 1:03:35 PM By: Harold Barban Entered By: Worthy Keeler on 02/04/2019 14:27:19 Eder, Strome [name] ([id number]) -------------------------------------------------------------------------------- SuperBill Details Patient Name: [name] Date of Service: 02/04/2019 Medical Record Number: [id number] Patient Account Number: [id number] Date of Birth/Sex: 1931/08/27 (85 y.o. M) Treating RN: Harold Barban Primary Care Provider: Burman Freestone Other  Clinician: Referring Provider: Burman Freestone Treating Provider/Extender: [name], HOYT Weeks in Treatment: 10 Diagnosis Coding ICD-10 Codes Code Description L89.154 Pressure ulcer of sacral region, stage 4 L02.31 Cutaneous abscess of buttock Facility Procedures CPT4 Code: [id number] Description: [id number] - DEB SUBQ TISSUE 20 SQ CM/< ICD-10 Diagnosis Description L89.154 Pressure ulcer of sacral region, stage 4 Modifier: Quantity: 1 Physician Procedures CPT4 Code: [id number] Description: [id number] - WC PHYS SUBQ TISS 20 SQ CM ICD-10 Diagnosis Description L89.154 Pressure ulcer of sacral region, stage 4 Modifier: Quantity: 1 Electronic Signature(s) Signed: 02/04/2019 11:56:08 PM By: Worthy Keeler PA-C Entered By: Worthy Keeler on 02/04/2019 14:28:03

## 2019-06-15 ENCOUNTER — Other Ambulatory Visit: Payer: Self-pay

## 2019-06-15 ENCOUNTER — Inpatient Hospital Stay: Payer: Medicare Other

## 2019-06-15 LAB — BASIC METABOLIC PANEL
Anion gap: 7 (ref 5–15)
BUN: 12 mg/dL (ref 8–23)
CO2: 24 mmol/L (ref 22–32)
Calcium: 8.2 mg/dL — ABNORMAL LOW (ref 8.9–10.3)
Chloride: 109 mmol/L (ref 98–111)
Creatinine, Ser: 0.95 mg/dL (ref 0.61–1.24)
GFR calc Af Amer: >60 mL/min (ref >60)
GFR calc non Af Amer: >60 mL/min (ref >60)
Glucose, Bld: 121 mg/dL — ABNORMAL HIGH (ref 70–99)
Potassium: 3.4 mmol/L — ABNORMAL LOW (ref 3.5–5.1)
Sodium: 140 mmol/L (ref 135–145)

## 2019-06-15 LAB — MRSA PCR SCREENING: MRSA by PCR: NEGATIVE

## 2019-06-15 LAB — CBC WITH DIFFERENTIAL/PLATELET
Abs Immature Granulocytes: 0.06 10*3/uL (ref 0.00–0.07)
Basophils Absolute: 0.1 10*3/uL (ref 0.0–0.1)
Basophils Relative: 0 %
Eosinophils Absolute: 0.1 10*3/uL (ref 0.0–0.5)
Eosinophils Relative: 0 %
HCT: 30.6 % — ABNORMAL LOW (ref 39.0–52.0)
Hemoglobin: 9.9 g/dL — ABNORMAL LOW (ref 13.0–17.0)
Immature Granulocytes: 0 %
Lymphocytes Relative: 10 %
Lymphs Abs: 1.5 10*3/uL (ref 0.7–4.0)
MCH: 28.9 pg (ref 26.0–34.0)
MCHC: 32.4 g/dL (ref 30.0–36.0)
MCV: 89.5 fL (ref 80.0–100.0)
Monocytes Absolute: 1.4 10*3/uL — ABNORMAL HIGH (ref 0.1–1.0)
Monocytes Relative: 9 %
Neutro Abs: 12.2 10*3/uL — ABNORMAL HIGH (ref 1.7–7.7)
Neutrophils Relative %: 81 %
Platelets: 258 10*3/uL (ref 150–400)
RBC: 3.42 MIL/uL — ABNORMAL LOW (ref 4.22–5.81)
RDW: 15.7 % — ABNORMAL HIGH (ref 11.5–15.5)
WBC: 15.3 10*3/uL — ABNORMAL HIGH (ref 4.0–10.5)
nRBC: 0 % (ref 0.0–0.2)

## 2019-06-15 LAB — SARS CORONAVIRUS 2 BY RT PCR (HOSPITAL ORDER, PERFORMED IN ~~LOC~~ HOSPITAL LAB): SARS Coronavirus 2: NEGATIVE

## 2019-06-15 LAB — C-REACTIVE PROTEIN: CRP: 7.1 mg/dL — ABNORMAL HIGH (ref <1.0)

## 2019-06-15 MED ORDER — ONDANSETRON HCL 4 MG/2ML IJ SOLN: 4.0000 mg | INTRAMUSCULAR | Status: DC | PRN

## 2019-06-15 MED ORDER — VANCOMYCIN HCL 10 G IV SOLR
1250.0000 mg | INTRAVENOUS | Status: DC
  Filled 2019-06-15: qty 1250

## 2019-06-15 MED ORDER — POLYETHYLENE GLYCOL 3350 17 G PO PACK: 17.0000 g | PACK | Freq: Every day | ORAL | Status: DC | PRN

## 2019-06-15 MED ORDER — SODIUM CHLORIDE 0.9 % IV SOLN
INTRAVENOUS | Status: DC
  Administered 2019-06-15 – 2019-06-17 (×3): via INTRAVENOUS

## 2019-06-15 MED ORDER — SODIUM CHLORIDE 0.9% FLUSH
3.0000 mL | INTRAVENOUS | Status: DC | PRN
  Administered 2019-06-15: 3 mL via INTRAVENOUS
  Filled 2019-06-15: qty 3

## 2019-06-15 MED ORDER — ALUM & MAG HYDROXIDE-SIMETH 200-200-20 MG/5ML PO SUSP: 30.0000 mL | ORAL | Status: DC | PRN

## 2019-06-15 MED ORDER — OXYCODONE-ACETAMINOPHEN 5-325 MG PO TABS: 1.0000 | ORAL_TABLET | ORAL | Status: DC | PRN

## 2019-06-15 MED ORDER — MORPHINE SULFATE (PF) 2 MG/ML IV SOLN
1.0000 mg | INTRAVENOUS | Status: DC | PRN
  Administered 2019-06-15: 06:00:00 2 mg via INTRAVENOUS
  Filled 2019-06-15: qty 1

## 2019-06-15 MED ORDER — HYDRALAZINE HCL 20 MG/ML IJ SOLN
10.0000 mg | Freq: Four times a day (QID) | INTRAMUSCULAR | Status: DC | PRN
  Administered 2019-06-15: 04:00:00 10 mg via INTRAVENOUS
  Filled 2019-06-15: qty 1

## 2019-06-15 MED ORDER — PIPERACILLIN-TAZOBACTAM 3.375 G IVPB 30 MIN: 3.3750 g | Freq: Four times a day (QID) | INTRAVENOUS | Status: DC

## 2019-06-15 MED ORDER — ACETAMINOPHEN 325 MG PO TABS
650.0000 mg | ORAL_TABLET | Freq: Once | ORAL | Status: AC
  Administered 2019-06-15: 650 mg via ORAL
  Filled 2019-06-15: qty 2

## 2019-06-15 MED ORDER — ACETAMINOPHEN 325 MG PO TABS
650.0000 mg | ORAL_TABLET | ORAL | Status: DC | PRN
  Administered 2019-06-16: 08:00:00 650 mg via ORAL
  Filled 2019-06-15: qty 2

## 2019-06-15 MED ORDER — TRAMADOL HCL 50 MG PO TABS
50.0000 mg | ORAL_TABLET | ORAL | Status: AC
  Administered 2019-06-15: 50 mg via ORAL
  Filled 2019-06-15: qty 1

## 2019-06-15 MED ORDER — LABETALOL HCL 5 MG/ML IV SOLN: 20.0000 mg | INTRAVENOUS | Status: DC | PRN

## 2019-06-15 MED ORDER — TRAZODONE HCL 50 MG PO TABS
25.0000 mg | ORAL_TABLET | Freq: Every evening | ORAL | Status: DC | PRN
  Administered 2019-06-15 – 2019-06-16 (×2): 25 mg via ORAL
  Filled 2019-06-15 (×3): qty 1

## 2019-06-15 MED ORDER — POLYETHYLENE GLYCOL 3350 17 G PO PACK: 17.0000 g | PACK | Freq: Every day | ORAL | Status: DC

## 2019-06-15 MED ORDER — PIPERACILLIN-TAZOBACTAM 3.375 G IVPB
3.3750 g | Freq: Three times a day (TID) | INTRAVENOUS | Status: DC
  Administered 2019-06-15 – 2019-06-17 (×7): 3.375 g via INTRAVENOUS
  Filled 2019-06-15 (×7): qty 50

## 2019-06-15 NOTE — Progress Notes (Signed)
Family Meeting Note  Advance Directive:yes  Today a meeting took place with the Patient and Daughter at bedside.  The following clinical team members were present during this meeting:MD  The following were discussed:Patient's diagnosis: 85 year old male admitted for fever and suspected sepsis  Past Medical History:  Diagnosis Date  . Anginal pain ([place])   . Arthritis    "hips, back" (06/17/2015)  . Asthma   . Atrial fibrillation ([place]) 01/21/2018  . Chronic chest pain   . Chronic lower back pain   . Coronary artery disease    a. s/p BMS to RCA in 2002; b. s/p cutting balloon POBA ;   c. cath 6/12: oDx 80% (treated with repeat cutting balloon POBA), mLAD 50% with 30-40% at Dx, CFX 30%, pRCA 25% with patent stents;  d.  Lex MV 4/14:  Low Risk - EF 61%, inf scar with peri-infarct ischemia  . Dyspnea    chronic  . Dysrhythmia   . Essential hypertension   . GERD (gastroesophageal reflux disease)    h/o esophageal spasm  . GI bleed 03/03/2014  . Headache   . History of blood transfusion    "related to OR"  . History of hiatal hernia   . Hyperlipidemia   . Hypertension   . 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 Memory loss   . Myocardial infarction ([place]) 2001   x 1, confirned 1 possible  . Pre-syncope 07/31/2017    Patient's progosis: < 12 months and Goals for treatment: DNR  Additional follow-up to be provided: Outpatient palliative care, consider hospice when appropriate  Time spent during discussion:20 minutes  Max Sane, MD

## 2019-06-15 NOTE — ED Notes (Signed)
ED TO INPATIENT HANDOFF REPORT  ED Nurse Name and Phone #:  Arrie Aran [phone]  S Name/Age/Gender [name] 85 y.o. male Room/Bed: ED18A/ED18A  Code Status   Code Status: Full Code  Home/SNF/Other Home Patient oriented to: self Is this baseline? Yes   Triage Complete: Triage complete  Chief Complaint Sepsis  Triage Note Pt arrives to ED via [facility] from home with c/o fever, weakness, and possible complications r/t surgery on Monday (EMS reports pt had surgery in [facility] for carpal tunnel syndrome). EMS states they were called out to the pt's residence today d/t fever and weakness. EMS reports pt was recently taken of Hydrocodone and placed on Tylenol for pain control by his PCP d/t diminished mental status that family believed was caused by the narcotic pain medications. Pt arrives with 20g IV in the right hand; 200 mL NS given by EMS en route.   Allergies Allergies  Allergen Reactions  . Budesonide-Formoterol Fumarate Swelling    Mouth and tongue swelling.  . Iohexol Shortness Of Breath and Itching    Pt was given 100cc of Omnipaque 300 followed by itching/ dyspnea.  Clementeen Hoof [Iodinated Diagnostic Agents] Shortness Of Breath and Other (See Comments)    Iodine pt had a reaction when he had a CT DONE  . Simvastatin Other (See Comments)    REACTION: unknown  . Demerol [Meperidine] Other (See Comments)    REACTION: Hallucinations  . Tape Other (See Comments)    Tears skin off, Please use "paper" tape  . Naproxen Swelling  . Pregabalin Rash  . Sulfonamide Derivatives Rash    Level of Care/Admitting Diagnosis ED Disposition    ED Disposition Condition [facility] Area: [name] [[id number]]  Level of Care: Med-Surg [16]  Covid Evaluation: Asymptomatic Screening Protocol (No Symptoms)  Diagnosis: Sepsis [id number]  Admitting Physician: Christel Mormon [id number]  Attending Physician: Christel Mormon [id number]  Estimated length of  stay: 3 - 4 days  Certification:: I certify this patient will need inpatient services for at least 2 midnights  PT Class (Do Not Modify): Inpatient [101]  PT Acc Code (Do Not Modify): Private [1]       B Medical/Surgery History Past Medical History:  Diagnosis Date  . Anginal pain ([place])   . Arthritis    "hips, back" (06/17/2015)  . Asthma   . Atrial fibrillation ([place]) 01/21/2018  . Chronic chest pain   . Chronic lower back pain   . Coronary artery disease    a. s/p BMS to RCA in 2002; b. s/p cutting balloon POBA ;   c. cath 6/12: oDx 80% (treated with repeat cutting balloon POBA), mLAD 50% with 30-40% at Dx, CFX 30%, pRCA 25% with patent stents;  d.  Lex MV 4/14:  Low Risk - EF 61%, inf scar with peri-infarct ischemia  . Dyspnea    chronic  . Dysrhythmia   . Essential hypertension   . GERD (gastroesophageal reflux disease)    h/o esophageal spasm  . GI bleed 03/03/2014  . Headache   . History of blood transfusion    "related to OR"  . History of hiatal hernia   . Hyperlipidemia   . Hypertension   . Melanoma of lower back ([place]) late 1990's  . Memory loss   . Myocardial infarction ([place]) 2001   x 1, confirned 1 possible  . Pre-syncope 07/31/2017   Past Surgical History:  Procedure Laterality Date  . ANTERIOR  CERVICAL DECOMP/DISCECTOMY FUSION    . BACK SURGERY  multiple  . CARDIAC CATHETERIZATION     "I've had 17 caths" (06/17/2015)  . CATARACT EXTRACTION W/ INTRAOCULAR LENS  IMPLANT, BILATERAL Bilateral   . CERVICAL DISC SURGERY  multiple  . COLONOSCOPY WITH PROPOFOL N/A 04/17/2014   Procedure: COLONOSCOPY WITH PROPOFOL;  Surgeon: Winfield Cunas., MD;  Location: [facility] ENDOSCOPY;  Service: Endoscopy;  Laterality: N/A;  . CORONARY ANGIOPLASTY WITH STENT PLACEMENT  x 2 stents    previous percutaneous intervention on the  RCA and the diagonal branch  . ESOPHAGOGASTRODUODENOSCOPY  03/23/2012   Procedure: ESOPHAGOGASTRODUODENOSCOPY (EGD);  Surgeon: Winfield Cunas., MD;   Location: [facility];  Service: Endoscopy;  Laterality: N/A;  . ESOPHAGOGASTRODUODENOSCOPY N/A 03/04/2014   Procedure: ESOPHAGOGASTRODUODENOSCOPY (EGD);  Surgeon: Arta Silence, MD;  Location[facility] ENDOSCOPY;  Service: Endoscopy;  Laterality: N/A;  . LAMINECTOMY    . LEFT HEART CATHETERIZATION WITH CORONARY ANGIOGRAM N/A 01/13/2014   Procedure: LEFT HEART CATHETERIZATION WITH CORONARY ANGIOGRAM;  Surgeon: Blane Ohara, MD;  Location: [facility] CATH LAB;  Service: Cardiovascular;  Laterality: N/A;  . MELANOMA EXCISION  late 1990's   "lower back"  . POSTERIOR LAMINECTOMY / DECOMPRESSION CERVICAL SPINE    . SAVORY DILATION  03/23/2012   Procedure: SAVORY DILATION;  Surgeon: Winfield Cunas., MD;  Location: [facility];  Service: Endoscopy;  Laterality: N/A;  . TONSILLECTOMY  1930's  . WOUND DEBRIDEMENT N/A 12/18/2018   Procedure: SACRAL WOUND EXPLORATION AND DEBRIDEMENT;  Surgeon: Benjamine Sprague, DO;  Location: [facility] ORS;  Service: General;  Laterality: N/A;     A IV Location/Drains/Wounds Patient Lines/Drains/Airways Status   Active Line/Drains/Airways    Name:   Placement date:   Placement time:   Site:   Days:   Peripheral IV 06/14/19 Right Hand   06/14/19    2145    Hand   1   Peripheral IV 06/14/19 Right Wrist   06/14/19    2221    Wrist   1   Incision (Closed) 12/18/18 Sacrum   12/18/18    0834     179   Pressure Injury 01/21/18 Deep Tissue Injury - Purple or maroon localized area of discolored intact skin or blood-filled blister due to damage of underlying soft tissue from pressure and/or shear. dark purple   01/21/18    0332     510   Pressure Injury 09/24/18 Stage III -  Full thickness tissue loss. Subcutaneous fat may be visible but bone, tendon or muscle are NOT exposed.   09/24/18    1840     264   Wound / Incision (Open or Dehisced) 01/21/18 Burn Back Upper large red open areas with green edges   01/21/18    0334    Back   510          Intake/Output Last 24 hours No intake or  output data in the 24 hours ending 06/15/19 0014  Labs/Imaging Results for orders placed or performed during the hospital encounter of 06/14/19 (from the past 48 hour(s))  CBC with Differential     Status: Abnormal   Collection Time: 06/14/19 10:05 PM  Result Value Ref Range   WBC 14.1 (H) 4.0 - 10.5 K/uL   RBC 3.67 (L) 4.22 - 5.81 MIL/uL   Hemoglobin 10.7 (L) 13.0 - 17.0 g/dL   HCT 32.3 (L) 39.0 - 52.0 %   MCV 88.0 80.0 - 100.0 fL   MCH 29.2 26.0 -  34.0 pg   MCHC 33.1 30.0 - 36.0 g/dL   RDW 15.7 (H) 11.5 - 15.5 %   Platelets 272 150 - 400 K/uL   nRBC 0.0 0.0 - 0.2 %   Neutrophils Relative % 83 %   Neutro Abs 11.6 (H) 1.7 - 7.7 K/uL   Lymphocytes Relative 8 %   Lymphs Abs 1.1 0.7 - 4.0 K/uL   Monocytes Relative 8 %   Monocytes Absolute 1.2 (H) 0.1 - 1.0 K/uL   Eosinophils Relative 0 %   Eosinophils Absolute 0.1 0.0 - 0.5 K/uL   Basophils Relative 0 %   Basophils Absolute 0.1 0.0 - 0.1 K/uL   Immature Granulocytes 1 %   Abs Immature Granulocytes 0.08 (H) 0.00 - 0.07 K/uL    Comment: Performed at [facility], [address] [place], [place] [address]  Comprehensive metabolic panel     Status: Abnormal   Collection Time: 06/14/19 10:05 PM  Result Value Ref Range   Sodium 135 135 - 145 mmol/L   Potassium 4.0 3.5 - 5.1 mmol/L   Chloride 102 98 - 111 mmol/L   CO2 25 22 - 32 mmol/L   Glucose, Bld 157 (H) 70 - 99 mg/dL   BUN 16 8 - 23 mg/dL   Creatinine, Ser 1.14 0.61 - 1.24 mg/dL   Calcium 8.9 8.9 - 10.3 mg/dL   Total Protein 6.6 6.5 - 8.1 g/dL   Albumin 3.4 (L) 3.5 - 5.0 g/dL   AST 21 15 - 41 U/L   ALT 13 0 - 44 U/L   Alkaline Phosphatase 92 38 - 126 U/L   Total Bilirubin 0.5 0.3 - 1.2 mg/dL   GFR calc non Af Amer 58 (L) >60 mL/min   GFR calc Af Amer >60 >60 mL/min   Anion gap 8 5 - 15    Comment: Performed at [facility], [address] [place], [place] [address]  Protime-INR     Status: None   Collection Time: 06/14/19 10:05 PM  Result Value  Ref Range   Prothrombin Time 13.7 11.4 - 15.2 seconds   INR 1.1 0.8 - 1.2    Comment: (NOTE) INR goal varies based on device and disease states. Performed at [facility], [address] [place], [place] [address]   APTT     Status: None   Collection Time: 06/14/19 10:05 PM  Result Value Ref Range   aPTT 35 24 - 36 seconds    Comment: Performed at [facility], [address] [place], [place] [address]  Sedimentation rate     Status: Abnormal   Collection Time: 06/14/19 10:05 PM  Result Value Ref Range   Sed Rate 35 (H) 0 - 20 mm/hr    Comment: Performed at [facility] [address] [place], [place] [address]  Lactic acid, plasma     Status: None   Collection Time: 06/14/19 10:05 PM  Result Value Ref Range   Lactic Acid, Venous 1.3 0.5 - 1.9 mmol/L    Comment: Performed at [facility], [address] [place], [place] [address]  Urinalysis, Routine w reflex microscopic     Status: Abnormal   Collection Time: 06/14/19 10:05 PM  Result Value Ref Range   Color, Urine YELLOW (A) YELLOW   APPearance CLEAR (A) CLEAR   Specific Gravity, Urine 1.016 1.005 - 1.030   pH 5.0 5.0 - 8.0   Glucose, UA NEGATIVE NEGATIVE mg/dL   Hgb urine dipstick  SMALL (A) NEGATIVE   Bilirubin Urine NEGATIVE NEGATIVE   Ketones, ur NEGATIVE NEGATIVE mg/dL   Protein, ur NEGATIVE NEGATIVE mg/dL   Nitrite NEGATIVE NEGATIVE   Leukocytes,Ua NEGATIVE NEGATIVE   RBC / HPF 0-5 0 - 5 RBC/hpf   WBC, UA 0-5 0 - 5 WBC/hpf   Bacteria, UA NONE SEEN NONE SEEN   Squamous Epithelial / LPF NONE SEEN 0 - 5   Mucus PRESENT     Comment: Performed at [facility], [address], [place], [place] [address]   Dg Wrist Complete Left  Result Date: 06/14/2019 CLINICAL DATA:  Fever, weakness. Recent carpal tunnel surgery. Concern for possible infection EXAM: LEFT WRIST - COMPLETE 3+ VIEW COMPARISON:  None. FINDINGS: There is chondrocalcinosis within the left wrist. Diffuse  arthritic change with joint space narrowing and spurring throughout the left wrist. No acute bony abnormality. Specifically, no fracture, subluxation, or dislocation. IMPRESSION: Chondrocalcinosis and diffuse degenerative changes. No acute bony abnormality. Electronically Signed   By: Rolm Baptise M.D.   On: 06/14/2019 22:35   Dg Hand 2 View Left  Result Date: 06/14/2019 CLINICAL DATA:  Recent carpal tunnel surgery. Fever, concern for infection EXAM: LEFT HAND - 2 VIEW COMPARISON:  Left wrist today. FINDINGS: Degenerative changes throughout the left wrist joints. No acute bony abnormality. Specifically, no fracture, subluxation, or dislocation. 4 mm radiopaque density within the soft tissues of the left index finger. IMPRESSION: No acute bony abnormality. Radiopaque foreign body within the soft tissues of the left index finger. Electronically Signed   By: Rolm Baptise M.D.   On: 06/14/2019 22:35   Dg Chest Portable 1 View  Result Date: 06/14/2019 CLINICAL DATA:  Fever, weakness EXAM: PORTABLE CHEST 1 VIEW COMPARISON:  08/20/2018 FINDINGS: Left basilar atelectasis or infiltrate. Right lung clear. Heart is normal size. No effusions or acute bony abnormality. IMPRESSION: Left base atelectasis or infiltrate. Electronically Signed   By: Rolm Baptise M.D.   On: 06/14/2019 22:33    Pending Labs Unresulted Labs (From admission, onward)    Start     Ordered   06/15/19 0500  CBC with Differential/Platelet  Tomorrow morning,   STAT     06/15/19 0005   06/15/19 [id number]  Basic metabolic panel  Tomorrow morning,   STAT     06/15/19 0005   06/14/19 2201  SARS Coronavirus 2 [facility] order, Performed in [facility] hospital lab) Nasopharyngeal Nasopharyngeal Swab  (Symptomatic/High Risk of Exposure/Tier 1 Patients Labs with Precautions)  Once,   STAT    Question Answer Comment  Is this test for diagnosis or screening Diagnosis of ill patient   Symptomatic for COVID-19 as defined by CDC Yes   Date of Symptom Onset  06/14/2019   Hospitalized for COVID-19 Yes   Admitted to ICU for COVID-19 No   Previously tested for COVID-19 No   Resident in a congregate (group) care setting No   Employed in healthcare setting No      06/14/19 2200   06/14/19 2200  C-reactive protein  Once,   STAT     06/14/19 2200   06/14/19 2200  Lactic acid, plasma  Now then every 2 hours,   STAT     06/14/19 2200   06/14/19 2200  Blood culture (routine x 2)  BLOOD CULTURE X 2,   STAT     06/14/19 2200   06/14/19 2200  Urine culture  ONCE - STAT,   STAT     06/14/19 2200  Vitals/Pain Today's Vitals   06/14/19 2200 06/14/19 2230 06/14/19 2300 06/14/19 2330  BP: (!) 181/78 (!) 178/95 (!) 174/119 (!) 180/86  Pulse: 89 94 (!) 105 89  Resp: 15 20 (!) 22 16  Temp:      TempSrc:      SpO2: 93% 95% 96% 94%  Weight:      Height:      PainSc:        Isolation Precautions No active isolations  Medications Medications  vancomycin (VANCOCIN) 1,500 mg in sodium chloride 0.9 % 500 mL IVPB (1,500 mg Intravenous New Bag/Given 06/14/19 2328)  HYDROcodone-acetaminophen (NORCO) 7.5-325 MG per tablet 1 tablet (has no administration in time range)  amiodarone (PACERONE) tablet 100 mg (has no administration in time range)  amLODipine (NORVASC) tablet 1.25 mg (has no administration in time range)  atorvastatin (LIPITOR) tablet 80 mg (has no administration in time range)  doxazosin (CARDURA) tablet 1 mg (has no administration in time range)  docusate sodium (COLACE) capsule 100 mg (has no administration in time range)  pantoprazole (PROTONIX) EC tablet 40 mg (has no administration in time range)  tamsulosin (FLOMAX) capsule 0.4 mg (has no administration in time range)  apixaban (ELIQUIS) tablet 5 mg (has no administration in time range)  vitamin B-12 (CYANOCOBALAMIN) tablet 1,000 mcg (has no administration in time range)  Melatonin TABS 10 mg (has no administration in time range)  feeding supplement (ENSURE ENLIVE) (ENSURE  ENLIVE) liquid 237 mL (has no administration in time range)  acetaminophen (TYLENOL) tablet 650 mg (has no administration in time range)  alum & mag hydroxide-simeth (MAALOX/MYLANTA) 200-200-20 MG/5ML suspension 30 mL (has no administration in time range)  traZODone (DESYREL) tablet 25 mg (has no administration in time range)  ondansetron (ZOFRAN) injection 4 mg (has no administration in time range)  oxyCODONE-acetaminophen (PERCOCET/ROXICET) 5-325 MG per tablet 1-2 tablet (has no administration in time range)  0.9 %  sodium chloride infusion (has no administration in time range)  piperacillin-tazobactam (ZOSYN) IVPB 3.375 g (has no administration in time range)  sodium chloride 0.9 % bolus 1,000 mL (1,000 mLs Intravenous New Bag/Given 06/14/19 2251)  piperacillin-tazobactam (ZOSYN) IVPB 3.375 g (0 g Intravenous Stopped 06/14/19 2321)    Mobility walks with device High fall risk   Focused Assessments Cardiac Assessment Handoff:    Lab Results  Component Value Date   CKTOTAL 37 (L) 10/06/2018   CKMB 6.5 (H) 01/21/2018   TROPONINI <0.03 10/06/2018   Lab Results  Component Value Date   DDIMER 0.95 (H) 01/19/2015   Does the Patient currently have chest pain? No     R Recommendations: See Admitting Provider Note  Report given to:   Additional Notes:  -

## 2019-06-15 NOTE — Progress Notes (Signed)
Pharmacy Antibiotic Note  [name] is a 85 y.o. male admitted on 06/14/2019 with sepsis s/t decubitus ulcer stage III meeting 3/4 SIRS and was found febrile w/ weakness.  Pharmacy has been consulted for vanc/zosyn dosing.  Plan: Patient received vanc 1.5g IV load and zosyn 3.375g IV x 1  Vancomycin 1250 mg IV Q 24 hrs. Goal AUC 400-550. Expected AUC: 545.3 SCr used: 1.14 Cssmin: 13.8  Will continue zosyn 3.375g IV q8h per CrCl > 20 ml/min. Will continue to monitor renal function and s/sx of infection.  Height: 5\' 9"  (175.3 cm) Weight: 163 lb 2.3 oz (74 kg) IBW/kg (Calculated) : 70.7  Temp (24hrs), Avg:101.4 F (38.6 C), Min:101.4 F (38.6 C), Max:101.4 F (38.6 C)  Recent Labs  Lab 06/14/19 2205  WBC 14.1*  CREATININE 1.14  LATICACIDVEN 1.3    Estimated Creatinine Clearance: 46.5 mL/min (by C-G formula based on SCr of 1.14 mg/dL).    Allergies  Allergen Reactions  . Budesonide-Formoterol Fumarate Swelling    Mouth and tongue swelling.  . Iohexol Shortness Of Breath and Itching    Pt was given 100cc of Omnipaque 300 followed by itching/ dyspnea.  Clementeen Hoof [Iodinated Diagnostic Agents] Shortness Of Breath and Other (See Comments)    Iodine pt had a reaction when he had a CT DONE  . Simvastatin Other (See Comments)    REACTION: unknown  . Demerol [Meperidine] Other (See Comments)    REACTION: Hallucinations  . Tape Other (See Comments)    Tears skin off, Please use "paper" tape  . Naproxen Swelling  . Pregabalin Rash  . Sulfonamide Derivatives Rash    Thank you for allowing pharmacy to be a part of this patient's care.  Tobie Lords, PharmD, BCPS Clinical Pharmacist 06/15/2019 1:37 AM

## 2019-06-15 NOTE — Progress Notes (Signed)
Drinks supplements readily; reports decreased appetite when eating lunch. Colace given for constipation with no change. Dsg change to op site left hand with sutures intact with edges well approximated without reddness/pain/drainage/ minimal edema-cleansed with soap/water with dry gauze applied over incision and wrapped with wrap. Denies co's pain. Afebrile thus far; IV anbitiotics and IVF support  Continued. Up in chair for 2 hours and tolerated well.

## 2019-06-15 NOTE — H&P (Addendum)
[facility] [place] NAME: [name]    MR#:  [id number]  DATE OF BIRTH:  11/09/1930  DATE OF ADMISSION:  06/14/2019  PRIMARY CARE PHYSICIAN: [name], FNP   REQUESTING/REFERRING PHYSICIAN: Marjean Donna, MD  CHIEF COMPLAINT:   Chief Complaint  Patient presents with  . Fever  . Post-op Problem    HISTORY OF PRESENT ILLNESS:  [name]  is a 85 y.o. Caucasian male with a known history of coronary artery disease, atrial fibrillation, hypertension, GERD who underwent carpal tunnel release on his left wrist on Monday and presents tonight with generalized weakness as well as fever at home of 102 with altered mental status.    The patient had hydrocodone per his daughter who believes that this may have contributed to his altered mental status.  No cough or wheezing or shortness of breath.  No recent sick exposures.  No chest pain or palpitations.  No nausea vomiting or abdominal pain or diarrhea.  Upon presentation to the emergency room, temperature was one 1.4 and blood pressure 190/88 with a pulse of 93 respiratory to 18 with a pulse symmetry of 95% on room air.  Labs revealed lactic acid 1.3 and leukocytosis of 14.1 with neutrophilia with sed rate of 35, unremarkable UA.  Blood cultures were sent. Portable chest ray showed left basal atelectasis..  Two-view left hand x-ray showed radiopaque foreign body within the soft tissues of the left index finger.  And wrist x-ray showed chondrocalcinosis and diffuse degenerative changes without acute bony abnormalities.  COVID-19 test came back negative.  EKG showed normal sinus rhythm with a rate of 90.  The patient was given IV vancomycin and Zosyn as well as 1 L bolus of IV normal saline.  He will be admitted to a medically monitored bed for further evaluation and management. PAST MEDICAL HISTORY:   Past Medical History:  Diagnosis Date  . Anginal pain ([place])   . Arthritis    "hips,  back" (06/17/2015)  . Asthma   . Atrial fibrillation ([place]) 01/21/2018  . Chronic chest pain   . Chronic lower back pain   . Coronary artery disease    a. s/p BMS to RCA in 2002; b. s/p cutting balloon POBA ;   c. cath 6/12: oDx 80% (treated with repeat cutting balloon POBA), mLAD 50% with 30-40% at Dx, CFX 30%, pRCA 25% with patent stents;  d.  Lex MV 4/14:  Low Risk - EF 61%, inf scar with peri-infarct ischemia  . Dyspnea    chronic  . Dysrhythmia   . Essential hypertension   . GERD (gastroesophageal reflux disease)    h/o esophageal spasm  . GI bleed 03/03/2014  . Headache   . History of blood transfusion    "related to OR"  . History of hiatal hernia   . Hyperlipidemia   . Hypertension   . Melanoma of lower back ([place]) late 1990's  . Memory loss   . Myocardial infarction ([place]) 2001   x 1, confirned 1 possible  . Pre-syncope 07/31/2017    PAST SURGICAL HISTORY:   Past Surgical History:  Procedure Laterality Date  . ANTERIOR CERVICAL DECOMP/DISCECTOMY FUSION    . BACK SURGERY  multiple  . CARDIAC CATHETERIZATION     "I've had 17 caths" (06/17/2015)  . CATARACT EXTRACTION W/ INTRAOCULAR LENS  IMPLANT, BILATERAL Bilateral   . CERVICAL DISC SURGERY  multiple  . COLONOSCOPY WITH PROPOFOL N/A 04/17/2014  Procedure: COLONOSCOPY WITH PROPOFOL;  Surgeon: Winfield Cunas., MD;  Location: [facility] ENDOSCOPY;  Service: Endoscopy;  Laterality: N/A;  . CORONARY ANGIOPLASTY WITH STENT PLACEMENT  x 2 stents    previous percutaneous intervention on the  RCA and the diagonal branch  . ESOPHAGOGASTRODUODENOSCOPY  03/23/2012   Procedure: ESOPHAGOGASTRODUODENOSCOPY (EGD);  Surgeon: Winfield Cunas., MD;  Location: [facility];  Service: Endoscopy;  Laterality: N/A;  . ESOPHAGOGASTRODUODENOSCOPY N/A 03/04/2014   Procedure: ESOPHAGOGASTRODUODENOSCOPY (EGD);  Surgeon: Arta Silence, MD;  Location: [facility] ENDOSCOPY;  Service: Endoscopy;  Laterality: N/A;  . LAMINECTOMY    . LEFT HEART CATHETERIZATION WITH  CORONARY ANGIOGRAM N/A 01/13/2014   Procedure: LEFT HEART CATHETERIZATION WITH CORONARY ANGIOGRAM;  Surgeon: Blane Ohara, MD;  Location: [facility] CATH LAB;  Service: Cardiovascular;  Laterality: N/A;  . MELANOMA EXCISION  late 1990's   "lower back"  . POSTERIOR LAMINECTOMY / DECOMPRESSION CERVICAL SPINE    . SAVORY DILATION  03/23/2012   Procedure: SAVORY DILATION;  Surgeon: Winfield Cunas., MD;  Location: [facility] ENDOSCOPY;  Service: Endoscopy;  Laterality: N/A;  . TONSILLECTOMY  1930's  . WOUND DEBRIDEMENT N/A 12/18/2018   Procedure: SACRAL WOUND EXPLORATION AND DEBRIDEMENT;  Surgeon: Benjamine Sprague, DO;  Location: [facility] ORS;  Service: General;  Laterality: N/A;    SOCIAL HISTORY:   Social History   Tobacco Use  . Smoking status: Never Smoker  . Smokeless tobacco: Never Used  Substance Use Topics  . Alcohol use: No    FAMILY HISTORY:   Family History  Problem Relation Age of Onset  . Diabetes Father   . Heart disease Father   . Asthma Father   . Heart disease Mother        CABG hx age 60  . Colon cancer Son        hx   . Colitis Son        hx  . Crohn's disease Son   . Prostate cancer Paternal Grandfather     DRUG ALLERGIES:   Allergies  Allergen Reactions  . Budesonide-Formoterol Fumarate Swelling    Mouth and tongue swelling.  . Iohexol Shortness Of Breath and Itching    Pt was given 100cc of Omnipaque 300 followed by itching/ dyspnea.  Clementeen Hoof [Iodinated Diagnostic Agents] Shortness Of Breath and Other (See Comments)    Iodine pt had a reaction when he had a CT DONE  . Simvastatin Other (See Comments)    REACTION: unknown  . Demerol [Meperidine] Other (See Comments)    REACTION: Hallucinations  . Tape Other (See Comments)    Tears skin off, Please use "paper" tape  . Naproxen Swelling  . Pregabalin Rash  . Sulfonamide Derivatives Rash    REVIEW OF SYSTEMS:   ROS As per history of present illness. All pertinent systems were reviewed above. Constitutional,   HEENT, cardiovascular, respiratory, GI, GU, musculoskeletal, neuro, psychiatric, endocrine,  integumentary and hematologic systems were reviewed and are otherwise  negative/unremarkable except for positive findings mentioned above in the HPI.   MEDICATIONS AT HOME:   Prior to Admission medications   Medication Sig Start Date End Date Taking? Authorizing Provider  amiodarone (PACERONE) 200 MG tablet Take 0.5 tablets (100 mg total) by mouth daily. 09/11/18   Fay Records, MD  amLODipine (NORVASC) 2.5 MG tablet TAKE 1/2 TABLET BY MOUTH ONCE DAILY. 01/09/19   Fay Records, MD  apixaban (ELIQUIS) 5 MG TABS tablet Take 1 tablet (5 mg total) by  mouth 2 (two) times daily. 09/30/18   Thurnell Lose, MD  atorvastatin (LIPITOR) 80 MG tablet Take 1 tablet (80 mg total) by mouth daily. 12/27/17 12/17/18  Richardson Dopp T, PA-C  docusate sodium (COLACE) 100 MG capsule TAKE (1) CAPSULE BY MOUTH TWICE DAILY. 01/08/19   Lysle Pearl, Isami, DO  doxazosin (CARDURA) 1 MG tablet TAKE 1 TABLET BY MOUTH ONCE DAILY. Patient taking differently: Take 1 mg by mouth daily.  06/01/17   Fay Records, MD  doxycycline (VIBRAMYCIN) 100 MG capsule Take 100 mg by mouth 2 (two) times daily. 12/03/18   [provider]  feeding supplement, ENSURE ENLIVE, (ENSURE ENLIVE) LIQD Take 237 mLs by mouth 2 (two) times daily between meals. 08/02/17   Fritzi Mandes, MD  HYDROcodone-acetaminophen (NORCO) 7.5-325 MG tablet Take 1 tablet by mouth every 4 (four) hours as needed for moderate pain. FOR PAIN Patient taking differently: Take 1 tablet by mouth every 4 (four) hours as needed for moderate pain.  01/24/18   Gladstone Lighter, MD  Melatonin 10 MG TABS Take 10 mg by mouth at bedtime.    [provider]  nitroGLYCERIN (NITROSTAT) 0.4 MG SL tablet Place 0.4 mg under the tongue every 5 (five) minutes as needed for chest pain.    [provider]  pantoprazole (PROTONIX) 40 MG tablet TAKE 1 TABLET BY MOUTH TWICE DAILY  Patient taking differently: Take 40 mg by mouth daily.  06/01/17   Fay Records, MD  phenylephrine-shark liver oil-mineral oil-petrolatum (PREPARATION H) 0.25-3-14-71.9 % rectal ointment Place 1 application rectally 2 (two) times daily as needed for hemorrhoids.    [provider]  tamsulosin (FLOMAX) 0.4 MG CAPS capsule Take 0.4 mg by mouth 2 (two) times daily.  01/13/15   [provider]  vitamin B-12 1000 MCG tablet Take 1 tablet (1,000 mcg total) by mouth daily. 06/21/17   Geradine Girt, DO      VITAL SIGNS:  Blood pressure (!) 174/119, pulse (!) 105, temperature (!) 101.4 F (38.6 C), temperature source Oral, resp. rate (!) 22, height 5\' 9"  (1.753 m), weight 74 kg, SpO2 96 %.  PHYSICAL EXAMINATION:  Physical Exam  GENERAL:  85 y.o.-year-old Caucasian male patient lying in the bed with no acute distress.  EYES: Pupils equal, round, reactive to light and accommodation. No scleral icterus. Extraocular muscles intact.  HEENT: Head atraumatic, normocephalic. Oropharynx and nasopharynx clear.  NECK:  Supple, no jugular venous distention. No thyroid enlargement, no tenderness.  LUNGS: Normal breath sounds bilaterally, no wheezing, rales,rhonchi or crepitation. No use of accessory muscles of respiration.  CARDIOVASCULAR: Regular rate and rhythm, S1, S2 normal.  2/6 systolic ejection murmur with no rubs, or gallops.  ABDOMEN: Soft, nondistended, nontender. Bowel sounds present. No organomegaly or mass.  EXTREMITIES: No pedal edema, cyanosis, or clubbing.  NEUROLOGIC: Cranial nerves II through XII are intact. Muscle strength 5/5 in all extremities. Sensation intact. Gait not checked.  PSYCHIATRIC: The patient is alert and oriented x 3.  Normal affect and good eye contact. SKIN: Left palmar wrist intact sutures with mild induration and erythema as well as dorsal wrist warmth and tenderness with mild erythema extending to proximal dorsum of the hand.  LABORATORY PANEL:   CBC  Recent Labs  Lab 06/14/19 2205  WBC 14.1*  HGB 10.7*  HCT 32.3*  PLT 272   ------------------------------------------------------------------------------------------------------------------  Chemistries  Recent Labs  Lab 06/14/19 2205  NA 135  K 4.0  CL 102  CO2 25  GLUCOSE  157*  BUN 16  CREATININE 1.14  CALCIUM 8.9  AST 21  ALT 13  ALKPHOS 92  BILITOT 0.5   ------------------------------------------------------------------------------------------------------------------  Cardiac Enzymes No results for input(s): TROPONINI in the last 168 hours. ------------------------------------------------------------------------------------------------------------------  RADIOLOGY:  Dg Wrist Complete Left  Result Date: 06/14/2019 CLINICAL DATA:  Fever, weakness. Recent carpal tunnel surgery. Concern for possible infection EXAM: LEFT WRIST - COMPLETE 3+ VIEW COMPARISON:  None. FINDINGS: There is chondrocalcinosis within the left wrist. Diffuse arthritic change with joint space narrowing and spurring throughout the left wrist. No acute bony abnormality. Specifically, no fracture, subluxation, or dislocation. IMPRESSION: Chondrocalcinosis and diffuse degenerative changes. No acute bony abnormality. Electronically Signed   By: Rolm Baptise M.D.   On: 06/14/2019 22:35   Dg Hand 2 View Left  Result Date: 06/14/2019 CLINICAL DATA:  Recent carpal tunnel surgery. Fever, concern for infection EXAM: LEFT HAND - 2 VIEW COMPARISON:  Left wrist today. FINDINGS: Degenerative changes throughout the left wrist joints. No acute bony abnormality. Specifically, no fracture, subluxation, or dislocation. 4 mm radiopaque density within the soft tissues of the left index finger. IMPRESSION: No acute bony abnormality. Radiopaque foreign body within the soft tissues of the left index finger. Electronically Signed   By: Rolm Baptise M.D.   On: 06/14/2019 22:35   Dg Chest Portable 1 View  Result Date: 06/14/2019  CLINICAL DATA:  Fever, weakness EXAM: PORTABLE CHEST 1 VIEW COMPARISON:  08/20/2018 FINDINGS: Left basilar atelectasis or infiltrate. Right lung clear. Heart is normal size. No effusions or acute bony abnormality. IMPRESSION: Left base atelectasis or infiltrate. Electronically Signed   By: Rolm Baptise M.D.   On: 06/14/2019 22:33      IMPRESSION AND PLAN:   1.  Sepsis with associated metabolic encephalopathy.  The patient will be admitted to medically monitored bed.  He will be continued on IV vancomycin and Zosyn.  Will follow blood cultures.  This is likely secondary to left hand/wrist cellulitis.  Explore which x-ray showed likely left basal atelectasis.  I doubt pneumonia in his case.  He did not have any respiratory symptoms.  2.  Left hand and wrist cellulitis.  This is likely the culprit for #1.  Management as above.  3.  Paroxysmal atrial fibrillation.  We will continue his amiodarone and Eliquis.  He is currently in normal sinus rhythm.  4.  Hypertensive urgency.  We will continue his Norvasc and Cardura and place the patient on PRN IV labetalol.  5.  GERD.  PPI therapy will be resumed.  6.  BPH.  We will continue his Flomax.  7.  DVT prophylaxis.  We will continue Eliquis.   All the records are reviewed and case discussed with ED provider. The plan of care was discussed in details with the patient (and family). I answered all questions. The patient agreed to proceed with the above mentioned plan. Further management will depend upon hospital course.   CODE STATUS: Full code  TOTAL TIME TAKING CARE OF THIS PATIENT: 55 minutes.    Christel Mormon M.D on 06/15/2019 at 12:06 AM  Pager - [phone]  After 6pm go to [email or web address] - Proofreader  [facility]  Office  [phone]  CC: Primary care physician; [name], FNP   Note: This dictation was prepared with Dragon dictation along with smaller phrase technology. Any  transcriptional errors that result from this process are unintentional.

## 2019-06-15 NOTE — Progress Notes (Addendum)
error 

## 2019-06-15 NOTE — Progress Notes (Signed)
CODE SEPSIS - PHARMACY COMMUNICATION  **Broad Spectrum Antibiotics should be administered within 1 hour of Sepsis diagnosis**  Time Code Sepsis Called/Page Received: 0023  Antibiotics Ordered: vanc/zosyn  Time of 1st antibiotic administration: 2251  Additional action taken by pharmacy:   If necessary, Name of Provider/Nurse Contacted:     Tobie Lords ,PharmD Clinical Pharmacist  06/15/2019  1:40 AM

## 2019-06-15 NOTE — Progress Notes (Signed)
[facility] [facility] [place] NAME: [name]    MR#:  [id number]  DATE OF BIRTH:  Jan 12, 1931  SUBJECTIVE:  CHIEF COMPLAINT:   Chief Complaint  Patient presents with  . Fever  . Post-op Problem  Groggy, no new complaints, afebrile, daughter at bedside REVIEW OF SYSTEMS:  Review of Systems  Constitutional: Negative for diaphoresis, fever, malaise/fatigue and weight loss.  HENT: Negative for ear discharge, ear pain, hearing loss, nosebleeds, sore throat and tinnitus.   Eyes: Negative for blurred vision and pain.  Respiratory: Negative for cough, hemoptysis, shortness of breath and wheezing.   Cardiovascular: Negative for chest pain, palpitations, orthopnea and leg swelling.  Gastrointestinal: Negative for abdominal pain, blood in stool, constipation, diarrhea, heartburn, nausea and vomiting.  Genitourinary: Negative for dysuria, frequency and urgency.  Musculoskeletal: Negative for back pain and myalgias.  Skin: Negative for itching and rash.  Neurological: Negative for dizziness, tingling, tremors, focal weakness, seizures, weakness and headaches.  Psychiatric/Behavioral: Negative for depression. The patient is not nervous/anxious.     DRUG ALLERGIES:   Allergies  Allergen Reactions  . Budesonide-Formoterol Fumarate Swelling    Mouth and tongue swelling.  . Iohexol Shortness Of Breath and Itching    Pt was given 100cc of Omnipaque 300 followed by itching/ dyspnea.  Clementeen Hoof [Iodinated Diagnostic Agents] Shortness Of Breath and Other (See Comments)    Iodine pt had a reaction when he had a CT DONE  . Simvastatin Other (See Comments)    REACTION: unknown  . Demerol [Meperidine] Other (See Comments)    REACTION: Hallucinations  . Tape Other (See Comments)    Tears skin off, Please use "paper" tape  . Naproxen Swelling  . Pregabalin Rash  . Sulfonamide Derivatives Rash   VITALS:  Blood pressure (!) 164/80, pulse 73,  temperature 99.2 F (37.3 C), temperature source Oral, resp. rate 20, height 5\' 9"  (1.753 m), weight 74 kg, SpO2 97 %. PHYSICAL EXAMINATION:  Physical Exam HENT:     Head: Normocephalic and atraumatic.  Eyes:     Conjunctiva/sclera: Conjunctivae normal.     Pupils: Pupils are equal, round, and reactive to light.  Neck:     Musculoskeletal: Normal range of motion and neck supple.     Thyroid: No thyromegaly.     Trachea: No tracheal deviation.  Cardiovascular:     Rate and Rhythm: Normal rate and regular rhythm.     Heart sounds: Normal heart sounds.  Pulmonary:     Effort: Pulmonary effort is normal. No respiratory distress.     Breath sounds: Normal breath sounds. No wheezing.  Chest:     Chest wall: No tenderness.  Abdominal:     General: Bowel sounds are normal. There is no distension.     Palpations: Abdomen is soft.     Tenderness: There is no abdominal tenderness ( ).  Musculoskeletal: Normal range of motion.  Skin:    General: Skin is warm and dry.     Findings: No rash.  Neurological:     Mental Status: He is alert and oriented to person, place, and time.     Cranial Nerves: No cranial nerve deficit.    LABORATORY PANEL:  Male CBC  Recent Labs  Lab 06/15/19 0525  WBC 15.3*  HGB 9.9*  HCT 30.6*  PLT 258   ------------------------------------------------------------------------------------------------------------------ Chemistries  Recent Labs  Lab 06/14/19 2205 06/15/19 0525  NA 135  140  K 4.0 3.4*  CL 102 109  CO2 25 24  GLUCOSE 157* 121*  BUN 16 12  CREATININE 1.14 0.95  CALCIUM 8.9 8.2*  AST 21  --   ALT 13  --   ALKPHOS 92  --   BILITOT 0.5  --    RADIOLOGY:  Dg Lumbar Spine Complete  Result Date: 06/15/2019 CLINICAL DATA:  Low back pain and hip pain over the last 2 weeks after a fall. EXAM: LUMBAR SPINE - COMPLETE 4+ VIEW COMPARISON:  07/31/2017 FINDINGS: Previous posterior decompression and fusion surgery from L2 through L4. Solid union  without traumatic finding. No hardware complication. Chronic degenerative disease at the adjacent L1-2 level with disc space narrowing and vacuum phenomenon. No visible change by radiography. Old superior endplate compression deformity at T12 is unchanged. Probable posterior bony fusion from L4 to the sacrum. IMPRESSION: No acute or traumatic finding. Lumbosacral fusion from L2 to the sacrum. Pedicle screws and posterior rods L2 through L4. Chronic adjacent segment degenerative disease at L1-2 without visible change by radiography. This could certainly be painful. Old superior endplate compression deformity at T12 appears unchanged since 2018. Electronically Signed   By: Nelson Chimes M.D.   On: 06/15/2019 09:08   Dg Wrist Complete Left  Result Date: 06/14/2019 CLINICAL DATA:  Fever, weakness. Recent carpal tunnel surgery. Concern for possible infection EXAM: LEFT WRIST - COMPLETE 3+ VIEW COMPARISON:  None. FINDINGS: There is chondrocalcinosis within the left wrist. Diffuse arthritic change with joint space narrowing and spurring throughout the left wrist. No acute bony abnormality. Specifically, no fracture, subluxation, or dislocation. IMPRESSION: Chondrocalcinosis and diffuse degenerative changes. No acute bony abnormality. Electronically Signed   By: Rolm Baptise M.D.   On: 06/14/2019 22:35   Dg Hand 2 View Left  Result Date: 06/14/2019 CLINICAL DATA:  Recent carpal tunnel surgery. Fever, concern for infection EXAM: LEFT HAND - 2 VIEW COMPARISON:  Left wrist today. FINDINGS: Degenerative changes throughout the left wrist joints. No acute bony abnormality. Specifically, no fracture, subluxation, or dislocation. 4 mm radiopaque density within the soft tissues of the left index finger. IMPRESSION: No acute bony abnormality. Radiopaque foreign body within the soft tissues of the left index finger. Electronically Signed   By: Rolm Baptise M.D.   On: 06/14/2019 22:35   Dg Chest Portable 1 View  Result Date:  06/14/2019 CLINICAL DATA:  Fever, weakness EXAM: PORTABLE CHEST 1 VIEW COMPARISON:  08/20/2018 FINDINGS: Left basilar atelectasis or infiltrate. Right lung clear. Heart is normal size. No effusions or acute bony abnormality. IMPRESSION: Left base atelectasis or infiltrate. Electronically Signed   By: Rolm Baptise M.D.   On: 06/14/2019 22:33   Dg Hip Unilat With Pelvis 2-3 Views Right  Result Date: 06/15/2019 CLINICAL DATA:  Low back and right hip pain over the last 2 weeks after a fall. EXAM: DG HIP (WITH OR WITHOUT PELVIS) 2-3V RIGHT COMPARISON:  CT 09/24/2018 FINDINGS: Osteoarthritis of both hips right worse than left. No sign of fracture or dislocation. IMPRESSION: Osteoarthritis of both hips worse on the right than the left. No traumatic finding. Electronically Signed   By: Nelson Chimes M.D.   On: 06/15/2019 09:06   ASSESSMENT AND PLAN:   1.  Sepsis with associated metabolic encephalopathy: Present on admission  - continue IV vancomycin and Zosyn.  Will follow blood cultures.  Possibly secondary to left hand/wrist cellulitis.    Chest x-ray showed likely left basal atelectasis.  I doubt pneumonia in his  case.  He did not have any respiratory symptoms. -Could also be just viral infection, monitor fever curve.  If remains afebrile could finish short course of antibiotic  2.  Left hand and wrist cellulitis.  This is likely the culprit for #1. I do not see any signs of cellulitis. may be it is treated now  3.  Paroxysmal atrial fibrillation - continue his amiodarone and Eliquis.  He is currently in normal sinus rhythm.  4.  Hypertensive urgency: Resolved , continue his Norvasc and Cardura and PRN IV labetalol.  5.  GERD.  PPI therapy   6.  BPH.  continue his Flomax.  7.  Hypokalemia: Replete and recheck  DVT prophylaxis. continue Eliquis.  Daughter is agreeable to involve palliative care as an outpatient.  He does not do well with narcotics so avoid   All the records are reviewed  and case discussed with Care Management/Social Worker. Management plans discussed with the patient, family (daughter at bedside) and they are in agreement.  CODE STATUS: DNR  TOTAL TIME TAKING CARE OF THIS PATIENT: 35 minutes.   More than 50% of the time was spent in counseling/coordination of care: YES  POSSIBLE D/C IN 1-2 DAYS, DEPENDING ON CLINICAL CONDITION.   Max Sane M.D on 06/15/2019 at 10:56 AM  Between 7am to 6pm - Pager - [phone]  After 6pm go to [email or web address] - Proofreader  [facility]  Office  [phone]  CC: Primary care physician; [name], FNP  Note: This dictation was prepared with Dragon dictation along with smaller phrase technology. Any transcriptional errors that result from this process are unintentional.

## 2019-06-16 LAB — BASIC METABOLIC PANEL
Anion gap: 8 (ref 5–15)
BUN: 19 mg/dL (ref 8–23)
CO2: 25 mmol/L (ref 22–32)
Calcium: 9 mg/dL (ref 8.9–10.3)
Chloride: 106 mmol/L (ref 98–111)
Creatinine, Ser: 1.09 mg/dL (ref 0.61–1.24)
GFR calc Af Amer: >60 mL/min (ref >60)
GFR calc non Af Amer: >60 mL/min (ref >60)
Glucose, Bld: 117 mg/dL — ABNORMAL HIGH (ref 70–99)
Potassium: 3.8 mmol/L (ref 3.5–5.1)
Sodium: 139 mmol/L (ref 135–145)

## 2019-06-16 LAB — URINE CULTURE: Culture: <10000 — AB

## 2019-06-16 LAB — CBC
HCT: 31 % — ABNORMAL LOW (ref 39.0–52.0)
Hemoglobin: 10 g/dL — ABNORMAL LOW (ref 13.0–17.0)
MCH: 28.8 pg (ref 26.0–34.0)
MCHC: 32.3 g/dL (ref 30.0–36.0)
MCV: 89.3 fL (ref 80.0–100.0)
Platelets: 252 10*3/uL (ref 150–400)
RBC: 3.47 MIL/uL — ABNORMAL LOW (ref 4.22–5.81)
RDW: 15.7 % — ABNORMAL HIGH (ref 11.5–15.5)
WBC: 8.6 10*3/uL (ref 4.0–10.5)
nRBC: 0 % (ref 0.0–0.2)

## 2019-06-16 MED ORDER — VANCOMYCIN HCL 1.25 G IV SOLR
1250.0000 mg | INTRAVENOUS | Status: DC
  Administered 2019-06-16: 1250 mg via INTRAVENOUS
  Filled 2019-06-16 (×2): qty 1250

## 2019-06-16 MED ORDER — HYDROCODONE-ACETAMINOPHEN 5-325 MG PO TABS
0.5000 | ORAL_TABLET | Freq: Four times a day (QID) | ORAL | Status: DC | PRN
  Administered 2019-06-17 – 2019-06-18 (×3): 0.5 via ORAL
  Filled 2019-06-16 (×5): qty 1

## 2019-06-16 MED ORDER — AMLODIPINE BESYLATE 10 MG PO TABS
10.0000 mg | ORAL_TABLET | Freq: Every day | ORAL | Status: DC
  Administered 2019-06-16 – 2019-06-18 (×3): 10 mg via ORAL
  Filled 2019-06-16 (×3): qty 1

## 2019-06-16 MED ORDER — VANCOMYCIN HCL 1.25 G IV SOLR
1250.0000 mg | INTRAVENOUS | Status: DC
  Filled 2019-06-16: qty 1250

## 2019-06-16 MED ORDER — VANCOMYCIN HCL 1.25 G IV SOLR
1250.0000 mg | Freq: Two times a day (BID) | INTRAVENOUS | Status: DC
  Filled 2019-06-16: qty 1250

## 2019-06-16 MED ORDER — IBUPROFEN 400 MG PO TABS: 400.0000 mg | ORAL_TABLET | Freq: Four times a day (QID) | ORAL | Status: DC | PRN

## 2019-06-16 MED ORDER — IBUPROFEN 400 MG PO TABS
400.0000 mg | ORAL_TABLET | Freq: Four times a day (QID) | ORAL | Status: DC | PRN
  Administered 2019-06-16 – 2019-06-18 (×4): 400 mg via ORAL
  Filled 2019-06-16 (×4): qty 1

## 2019-06-16 NOTE — Progress Notes (Signed)
Pt more confused/disoriented today; activated bed alarm trying to get 0OB. Labs improving. Continued IVF's with IV Zosyn and vancomycin. Left hand incision clean, staples intact, edges well approximated; noted approximately 1 cm soft palpable area at upper incision of left forearm with mild pinkness;no active drainage. St. Gauze dsg changed as ordered. Afebrile today. Dgt in and states wants pt to be comfortable and that he has taken ibuprofen in past and interested in trying low dose hydrocodone for chronic back pain in interest of decreasing pt pain. Poor appetite for meals; drinks ensures readily.

## 2019-06-16 NOTE — Progress Notes (Signed)
[facility] [place] NAME: [name]    MR#:  [id number]  DATE OF BIRTH:  07-21-1931  SUBJECTIVE:  CHIEF COMPLAINT:   Chief Complaint  Patient presents with  . Fever  . Post-op Problem  sleepy, BP running high, low grade fever REVIEW OF SYSTEMS:  Review of Systems  Constitutional: Positive for fever. Negative for diaphoresis, malaise/fatigue and weight loss.  HENT: Negative for ear discharge, ear pain, hearing loss, nosebleeds, sore throat and tinnitus.   Eyes: Negative for blurred vision and pain.  Respiratory: Negative for cough, hemoptysis, shortness of breath and wheezing.   Cardiovascular: Negative for chest pain, palpitations, orthopnea and leg swelling.  Gastrointestinal: Negative for abdominal pain, blood in stool, constipation, diarrhea, heartburn, nausea and vomiting.  Genitourinary: Negative for dysuria, frequency and urgency.  Musculoskeletal: Negative for back pain and myalgias.  Skin: Negative for itching and rash.  Neurological: Negative for dizziness, tingling, tremors, focal weakness, seizures, weakness and headaches.  Psychiatric/Behavioral: Negative for depression. The patient is not nervous/anxious.     DRUG ALLERGIES:   Allergies  Allergen Reactions  . Budesonide-Formoterol Fumarate Swelling    Mouth and tongue swelling.  . Iohexol Shortness Of Breath and Itching    Pt was given 100cc of Omnipaque 300 followed by itching/ dyspnea.  Clementeen Hoof [Iodinated Diagnostic Agents] Shortness Of Breath and Other (See Comments)    Iodine pt had a reaction when he had a CT DONE  . Simvastatin Other (See Comments)    REACTION: unknown  . Demerol [Meperidine] Other (See Comments)    REACTION: Hallucinations  . Tape Other (See Comments)    Tears skin off, Please use "paper" tape  . Naproxen Swelling  . Pregabalin Rash  . Sulfonamide Derivatives Rash   VITALS:  Blood pressure (!) 155/71, pulse 66,  temperature 98.1 F (36.7 C), resp. rate 20, height 5\' 9"  (1.753 m), weight 74 kg, SpO2 99 %. PHYSICAL EXAMINATION:  Physical Exam HENT:     Head: Normocephalic and atraumatic.  Eyes:     Conjunctiva/sclera: Conjunctivae normal.     Pupils: Pupils are equal, round, and reactive to light.  Neck:     Musculoskeletal: Normal range of motion and neck supple.     Thyroid: No thyromegaly.     Trachea: No tracheal deviation.  Cardiovascular:     Rate and Rhythm: Normal rate and regular rhythm.     Heart sounds: Normal heart sounds.  Pulmonary:     Effort: Pulmonary effort is normal. No respiratory distress.     Breath sounds: Normal breath sounds. No wheezing.  Chest:     Chest wall: No tenderness.  Abdominal:     General: Bowel sounds are normal. There is no distension.     Palpations: Abdomen is soft.     Tenderness: There is no abdominal tenderness ( ).  Musculoskeletal: Normal range of motion.  Skin:    General: Skin is warm and dry.     Findings: No rash.  Neurological:     Mental Status: He is confused.     Cranial Nerves: No cranial nerve deficit.     Comments: sleepy    LABORATORY PANEL:  Male CBC  Recent Labs  Lab 06/16/19 0505  WBC 8.6  HGB 10.0*  HCT 31.0*  PLT 252   ------------------------------------------------------------------------------------------------------------------ Chemistries  Recent Labs  Lab 06/14/19 2205  06/16/19 0505  NA 135   < >  139  K 4.0   < > 3.8  CL 102   < > 106  CO2 25   < > 25  GLUCOSE 157*   < > 117*  BUN 16   < > 19  CREATININE 1.14   < > 1.09  CALCIUM 8.9   < > 9.0  AST 21  --   --   ALT 13  --   --   ALKPHOS 92  --   --   BILITOT 0.5  --   --    < > = values in this interval not displayed.   RADIOLOGY:  No results found. ASSESSMENT AND PLAN:   1.  Sepsis with associated metabolic encephalopathy: Present on admission  - continue IV vancomycin and Zosyn.  Will follow blood cultures.  no signs of left  hand/wrist cellulitis.    Chest x-ray showed likely left basal atelectasis - could be bronchitis. pneumonia ruled out. no respiratory symptoms. -Could also be just viral infection, monitor fever curve.  If remains afebrile could finish short course of antibiotic. Still had low grade fever  2.  Left hand and wrist cellulitis. I do not see any signs of cellulitis. may be it is treated now  3.  Paroxysmal atrial fibrillation - continue his amiodarone and Eliquis.  He is currently in normal sinus rhythm.  4.  Hypertensive urgency: Resolved , continue his Norvasc and Cardura and PRN IV labetalol.  5.  GERD.  PPI therapy   6.  BPH.  continue his Flomax.  7.  Hypokalemia: Repleted  DVT prophylaxis. continue Eliquis.   Watch 1 more night for fever curve and mental status  Daughter is agreeable to involve palliative care as an outpatient.  He does not do well with narcotics so avoid   All the records are reviewed and case discussed with Care Management/Social Worker. Management plans discussed with the patient, nursing and they are in agreement.  CODE STATUS: DNR  TOTAL TIME TAKING CARE OF THIS PATIENT: 35 minutes.   More than 50% of the time was spent in counseling/coordination of care: YES  POSSIBLE D/C IN 1-2 DAYS, DEPENDING ON CLINICAL CONDITION.   Max Sane M.D on 06/16/2019 at 9:26 AM  Between 7am to 6pm - Pager - [phone]  After 6pm go to [email or web address] - Proofreader  [facility]  Office  [phone]  CC: Primary care physician; [name], FNP  Note: This dictation was prepared with Dragon dictation along with smaller phrase technology. Any transcriptional errors that result from this process are unintentional.

## 2019-06-16 NOTE — Progress Notes (Signed)
When I rounded daughter wasn't at bedside. I tried calling her twice to update her and also discuss pain meds regimen as nurse reported patient needing something more than tylenolol.  Based on my d/w daughter yesterday, she was very clear not to use any strong pain meds and no narcotics. And he's listed to have allergy to NSAIDs. Will await daughter input before starting stronger pain meds

## 2019-06-16 NOTE — Progress Notes (Signed)
Pt moved to RM 118 for safety due to confusion and trying to get OOB. Pt has been up in chair x 1 1/2 hours and tolerated well.

## 2019-06-17 ENCOUNTER — Encounter: Payer: Self-pay | Admitting: Primary Care

## 2019-06-17 DIAGNOSIS — A419 Sepsis, unspecified organism: Principal | ICD-10-CM

## 2019-06-17 DIAGNOSIS — Z515 Encounter for palliative care: Secondary | ICD-10-CM

## 2019-06-17 DIAGNOSIS — Z7189 Other specified counseling: Secondary | ICD-10-CM

## 2019-06-17 LAB — CBC
HCT: 30.8 % — ABNORMAL LOW (ref 39.0–52.0)
Hemoglobin: 10 g/dL — ABNORMAL LOW (ref 13.0–17.0)
MCH: 29 pg (ref 26.0–34.0)
MCHC: 32.5 g/dL (ref 30.0–36.0)
MCV: 89.3 fL (ref 80.0–100.0)
Platelets: 274 10*3/uL (ref 150–400)
RBC: 3.45 MIL/uL — ABNORMAL LOW (ref 4.22–5.81)
RDW: 15.7 % — ABNORMAL HIGH (ref 11.5–15.5)
WBC: 8.2 10*3/uL (ref 4.0–10.5)
nRBC: 0 % (ref 0.0–0.2)

## 2019-06-17 LAB — PROCALCITONIN: Procalcitonin: <0.1 ng/mL

## 2019-06-17 LAB — BASIC METABOLIC PANEL
Anion gap: 7 (ref 5–15)
BUN: 15 mg/dL (ref 8–23)
CO2: 27 mmol/L (ref 22–32)
Calcium: 8.9 mg/dL (ref 8.9–10.3)
Chloride: 105 mmol/L (ref 98–111)
Creatinine, Ser: 1.05 mg/dL (ref 0.61–1.24)
GFR calc Af Amer: >60 mL/min (ref >60)
GFR calc non Af Amer: >60 mL/min (ref >60)
Glucose, Bld: 116 mg/dL — ABNORMAL HIGH (ref 70–99)
Potassium: 3.9 mmol/L (ref 3.5–5.1)
Sodium: 139 mmol/L (ref 135–145)

## 2019-06-17 MED ORDER — HYDRALAZINE HCL 20 MG/ML IJ SOLN
10.0000 mg | Freq: Once | INTRAMUSCULAR | Status: AC
  Administered 2019-06-17: 10 mg via INTRAVENOUS
  Filled 2019-06-17: qty 1

## 2019-06-17 MED ORDER — ALPRAZOLAM 0.25 MG PO TABS
0.2500 mg | ORAL_TABLET | Freq: Once | ORAL | Status: AC
  Administered 2019-06-17: 0.25 mg via ORAL
  Filled 2019-06-17: qty 1

## 2019-06-17 NOTE — Progress Notes (Signed)
[facility] [place] NAME: [name]    MR#:  [id number]  DATE OF BIRTH:  25-Mar-1931  SUBJECTIVE:  CHIEF COMPLAINT:   Chief Complaint  Patient presents with  . Fever  . Post-op Problem  was confused/disoriented yesterday evening; remote safety sitter in place REVIEW OF SYSTEMS:  ROS unable to obtain due to his mental status DRUG ALLERGIES:   Allergies  Allergen Reactions  . Budesonide-Formoterol Fumarate Swelling    Mouth and tongue swelling.  . Iohexol Shortness Of Breath and Itching    Pt was given 100cc of Omnipaque 300 followed by itching/ dyspnea.  Clementeen Hoof [Iodinated Diagnostic Agents] Shortness Of Breath and Other (See Comments)    Iodine pt had a reaction when he had a CT DONE  . Simvastatin Other (See Comments)    REACTION: unknown  . Demerol [Meperidine] Other (See Comments)    REACTION: Hallucinations  . Tape Other (See Comments)    Tears skin off, Please use "paper" tape  . Naproxen Swelling  . Pregabalin Rash  . Sulfonamide Derivatives Rash   VITALS:  Blood pressure 138/74, pulse 69, temperature 97.7 F (36.5 C), resp. rate 19, height 5\' 9"  (1.753 m), weight 74 kg, SpO2 99 %. PHYSICAL EXAMINATION:  Physical Exam HENT:     Head: Normocephalic and atraumatic.  Eyes:     Conjunctiva/sclera: Conjunctivae normal.     Pupils: Pupils are equal, round, and reactive to light.  Neck:     Musculoskeletal: Normal range of motion and neck supple.     Thyroid: No thyromegaly.     Trachea: No tracheal deviation.  Cardiovascular:     Rate and Rhythm: Normal rate and regular rhythm.     Heart sounds: Normal heart sounds.  Pulmonary:     Effort: Pulmonary effort is normal. No respiratory distress.     Breath sounds: Normal breath sounds. No wheezing.  Chest:     Chest wall: No tenderness.  Abdominal:     General: Bowel sounds are normal. There is no distension.     Palpations: Abdomen is soft.   Tenderness: There is no abdominal tenderness ( ).  Musculoskeletal: Normal range of motion.  Skin:    General: Skin is warm and dry.     Findings: No rash.  Neurological:     Mental Status: He is confused.     Cranial Nerves: No cranial nerve deficit.     Comments: sleepy  Psychiatric:        Behavior: Behavior is uncooperative.    LABORATORY PANEL:  Male CBC  Recent Labs  Lab 06/17/19 0529  WBC 8.2  HGB 10.0*  HCT 30.8*  PLT 274   ------------------------------------------------------------------------------------------------------------------ Chemistries  Recent Labs  Lab 06/14/19 2205  06/17/19 0529  NA 135   < > 139  K 4.0   < > 3.9  CL 102   < > 105  CO2 25   < > 27  GLUCOSE 157*   < > 116*  BUN 16   < > 15  CREATININE 1.14   < > 1.05  CALCIUM 8.9   < > 8.9  AST 21  --   --   ALT 13  --   --   ALKPHOS 92  --   --   BILITOT 0.5  --   --    < > = values in this interval not displayed.  RADIOLOGY:  No results found. ASSESSMENT AND PLAN:   1.  Sepsis with associated metabolic encephalopathy: Present on admission  - Could also be just viral infection -He has remained afebrile, no leukocytosis, no obvious source of infection -We will stop all antibiotics  2.  Left hand and wrist cellulitis. I do not see any signs of cellulitis. may be it is treated now  3.  Paroxysmal atrial fibrillation - continue his amiodarone and Eliquis.  He is currently in normal sinus rhythm.  4.  Hypertensive urgency: Resolved , continue his Norvasc and Cardura and PRN IV labetalol.  5.  GERD.  PPI therapy   6.  BPH.  continue his Flomax.  7.  Hypokalemia: Repleted  8.  Acute delirium -Multifactorial  DVT prophylaxis. continue Eliquis.    I had a long discussion with his son [name].  Patient has very poor prognosis.  Family is agreeable to hospice services at home.  Will consult case management and wait for palliative care to evaluate for hospice.   All the  records are reviewed and case discussed with Care Management/Social Worker. Management plans discussed with the patient, nursing and they are in agreement.  CODE STATUS: DNR  TOTAL TIME TAKING CARE OF THIS PATIENT: 35 minutes.   More than 50% of the time was spent in counseling/coordination of care: YES  POSSIBLE D/C IN 1-2 DAYS, DEPENDING ON CLINICAL CONDITION.   Max Sane M.D on 06/17/2019 at 8:53 AM  Between 7am to 6pm - Pager - [phone]  After 6pm go to [email or web address] - Proofreader  [facility]  Office  [phone]  CC: Primary care physician; [name], FNP  Note: This dictation was prepared with Dragon dictation along with smaller phrase technology. Any transcriptional errors that result from this process are unintentional.

## 2019-06-17 NOTE — Progress Notes (Signed)
Pharmacy Antibiotic Note  HEZEKIAH LINES is a 85 y.o. male admitted on 06/14/2019 with sepsis s/t decubitus ulcer stage III meeting 3/4 SIRS and was found febrile w/ weakness.  Pharmacy has been consulted for vanc/zosyn dosing.  8/28 Patient started on vancomycin 1250mg  IV Q24 hours and Zosyn 3.375  Plan: Day 4 of broad spectrum antibiotics. Patient is afebrile, WBC: WNL. Cultures negative. Will discuss de-escalation of antibiotics with provider.   Scr stable. Continue Vancomycin 1250 mg IV Q 24 hrs. Goal AUC 400-550. Expected AUC: 506.6 SCr used: 1.05 Cssmin: 12.3  Will continue zosyn 3.375g IV q8h per CrCl > 20 ml/min.   Will continue to monitor renal function and s/sx of infection.  Height: 5\' 9"  (175.3 cm) Weight: 163 lb 2.3 oz (74 kg) IBW/kg (Calculated) : 70.7  Temp (24hrs), Avg:98.3 F (36.8 C), Min:97.7 F (36.5 C), Max:99 F (37.2 C)  Recent Labs  Lab 06/14/19 2205 06/15/19 0525 06/16/19 0505 06/17/19 0529  WBC 14.1* 15.3* 8.6 8.2  CREATININE 1.14 0.95 1.09 1.05  LATICACIDVEN 1.3  --   --   --     Estimated Creatinine Clearance: 50.5 mL/min (by C-G formula based on SCr of 1.05 mg/dL).    Allergies  Allergen Reactions  . Budesonide-Formoterol Fumarate Swelling    Mouth and tongue swelling.  . Iohexol Shortness Of Breath and Itching    Pt was given 100cc of Omnipaque 300 followed by itching/ dyspnea.  Clementeen Hoof [Iodinated Diagnostic Agents] Shortness Of Breath and Other (See Comments)    Iodine pt had a reaction when he had a CT DONE  . Simvastatin Other (See Comments)    REACTION: unknown  . Demerol [Meperidine] Other (See Comments)    REACTION: Hallucinations  . Tape Other (See Comments)    Tears skin off, Please use "paper" tape  . Naproxen Swelling  . Pregabalin Rash  . Sulfonamide Derivatives Rash    Thank you for allowing pharmacy to be a part of this patient's care.  Pernell Dupre, PharmD, BCPS Clinical Pharmacist 06/17/2019 7:14 AM

## 2019-06-17 NOTE — Consult Note (Signed)
Consultation Note Date: 06/17/2019   Patient Name: [name]  DOB: June 10, 1931  MRN: [id number]  Age / Sex: 85 y.o., male  PCP: [name], FNP Referring Physician: Max Sane, MD  Reason for Consultation: Establishing goals of care and Psychosocial/spiritual support  HPI/Patient Profile: 85 y.o. male  with past medical history of History of sepsis s/t decubitus ulcer stage III meeting 3/4 SIRS,  coronary artery disease, atrial fibrillation, hypertension, GERD who underwent carpal tunnel release on his left wrist on Monday admitted on 06/14/2019 with sepsis with metabolic encephalopathy.   Clinical Assessment and Goals of Care: Mr. Norell is resting quietly in bed.  He greets me making and keeping eye contact.  He appears chronically ill and somewhat frail.  He is oriented to self only at this time, his daughter Chong Sicilian is at bedside.  Patty request that we speak outside his room.  We talked about Mr. Robert's acute and chronic health concerns.  She shares that he had been living in his own home with a girlfriend, but when he leaves the hospital this time he will be going to live in his son's home.  Chong Sicilian shares that if he is dissatisfied they may transfer him to his daughter Teresa's home.  We talked about outpatient palliative and hospice services in detail.  At this point, Mr. Zakowski does not have an admitting hospice diagnosis.  Chong Sicilian shares her concern that when he returns home he will become dehydrated.  Patty asks how will they be able to tell if he qualifies for hospice without bringing him back to the hospital.  I encouraged her to call her outpatient palliative provider who can evaluate him outside of the hospital.  We talked about preferred place of death, home.  Conference with bedside nursing staff and hospice liaison related to patient condition, needs, palliative referral.  HCPOA      HCPOA - Son [name], second is daughter Helene Kelp.     SUMMARY OF RECOMMENDATIONS   Continue to treat the treatable but no CPR, no intubation. Outpatient palliative to follow, provider choice offered request [facility] provider Transition to hospice care when appropriate  Code Status/Advance Care Planning:  DNR  Symptom Management:   Per hospitalist, no additional needs at this time.  Palliative Prophylaxis:   Frequent Pain Assessment, Palliative Wound Care and Turn Reposition  Additional Recommendations (Limitations, Scope, Preferences):  Treat the treatable but no CPR, no intubation.   Psycho-social/Spiritual:   Desire for further Chaplaincy support:no  Additional Recommendations: Caregiving  Support/Resources and Education on Hospice  Prognosis:   Unable to determine, 6 months or less would not be surprising, but currently no hospice admitting diagnosis.  Discharge Planning: Home with outpatient palliative, transition to hospice when appropriate      Primary Diagnoses: Present on Admission: . Sepsis ([place])   I have reviewed the medical record, interviewed the patient and family, and examined the patient. The following aspects are pertinent.  Past Medical History:  Diagnosis Date  . Anginal pain ([place])   .  Arthritis    "hips, back" (06/17/2015)  . Asthma   . Atrial fibrillation ([place]) 01/21/2018  . Chronic chest pain   . Chronic lower back pain   . Coronary artery disease    a. s/p BMS to RCA in 2002; b. s/p cutting balloon POBA ;   c. cath 6/12: oDx 80% (treated with repeat cutting balloon POBA), mLAD 50% with 30-40% at Dx, CFX 30%, pRCA 25% with patent stents;  d.  Lex MV 4/14:  Low Risk - EF 61%, inf scar with peri-infarct ischemia  . Dyspnea    chronic  . Dysrhythmia   . Essential hypertension   . GERD (gastroesophageal reflux disease)    h/o esophageal spasm  . GI bleed 03/03/2014  . Headache   . History of blood transfusion    "related to OR"  .  History of hiatal hernia   . Hyperlipidemia   . Hypertension   . Melanoma of lower back ([place]) late 1990's  . Memory loss   . Myocardial infarction ([place]) 2001   x 1, confirned 1 possible  . Pre-syncope 07/31/2017   Social History   Socioeconomic History  . Marital status: Widowed    Spouse name: Not on file  . Number of children: 5  . Years of education: 40  . Highest education level: Not on file  Occupational History  . Occupation: retired    Fish farm manager: RETIRED    Comment: Teacher, English as a foreign language  . Financial resource strain: Not on file  . Food insecurity    Worry: Not on file    Inability: Not on file  . Transportation needs    Medical: Not on file    Non-medical: Not on file  Tobacco Use  . Smoking status: Never Smoker  . Smokeless tobacco: Never Used  Substance and Sexual Activity  . Alcohol use: No  . Drug use: No  . Sexual activity: Never    Birth control/protection: None  Lifestyle  . Physical activity    Days per week: Not on file    Minutes per session: Not on file  . Stress: Not on file  Relationships  . Social Herbalist on phone: Not on file    Gets together: Not on file    Attends religious service: Not on file    Active member of club or organization: Not on file    Attends meetings of clubs or organizations: Not on file    Relationship status: Not on file  Other Topics Concern  . Not on file  Social History Narrative   Lives in [place]. Generaly active around the house and walks his dog without chest pain or SOB.   Right-handed.   Lives alone.   No caffeine use.   Family History  Problem Relation Age of Onset  . Diabetes Father   . Heart disease Father   . Asthma Father   . Heart disease Mother        CABG hx age 34  . Colon cancer Son        hx   . Colitis Son        hx  . Crohn's disease Son   . Prostate cancer Paternal Grandfather    Scheduled Meds: . amiodarone  100 mg Oral Daily  . amLODipine  10 mg  Oral Daily  . apixaban  5 mg Oral BID  . atorvastatin  40 mg Oral Daily  . doxazosin  1 mg Oral Daily  .  feeding supplement (ENSURE ENLIVE)  237 mL Oral BID BM  . Melatonin  10 mg Oral QHS  . pantoprazole  40 mg Oral BID  . tamsulosin  0.4 mg Oral BID  . cyanocobalamin  1,000 mcg Oral Daily   Continuous Infusions: . sodium chloride Stopped (06/16/19 1332)   PRN Meds:.acetaminophen, alum & mag hydroxide-simeth, docusate sodium, HYDROcodone-acetaminophen, ibuprofen, labetalol, ondansetron (ZOFRAN) IV, polyethylene glycol, sodium chloride flush, traZODone Medications Prior to Admission:  Prior to Admission medications   Medication Sig Start Date End Date Taking? Authorizing Provider  amiodarone (PACERONE) 200 MG tablet Take 0.5 tablets (100 mg total) by mouth daily. 09/11/18   Fay Records, MD  amLODipine (NORVASC) 2.5 MG tablet TAKE 1/2 TABLET BY MOUTH ONCE DAILY. 01/09/19   Fay Records, MD  apixaban (ELIQUIS) 5 MG TABS tablet Take 1 tablet (5 mg total) by mouth 2 (two) times daily. 09/30/18   Thurnell Lose, MD  atorvastatin (LIPITOR) 80 MG tablet Take 1 tablet (80 mg total) by mouth daily. 12/27/17 12/17/18  Richardson Dopp T, PA-C  docusate sodium (COLACE) 100 MG capsule TAKE (1) CAPSULE BY MOUTH TWICE DAILY. Patient taking differently: 100 mg 2 (two) times daily as needed.  01/08/19   Lysle Pearl, Isami, DO  doxazosin (CARDURA) 1 MG tablet TAKE 1 TABLET BY MOUTH ONCE DAILY. Patient taking differently: Take 1 mg by mouth daily.  06/01/17   Fay Records, MD  Melatonin 10 MG TABS Take 10 mg by mouth at bedtime.    [provider]  nitroGLYCERIN (NITROSTAT) 0.4 MG SL tablet Place 0.4 mg under the tongue every 5 (five) minutes as needed for chest pain.    [provider]  pantoprazole (PROTONIX) 40 MG tablet TAKE 1 TABLET BY MOUTH TWICE DAILY Patient taking differently: Take 40 mg by mouth daily.  06/01/17   Fay Records, MD  phenylephrine-shark liver oil-mineral oil-petrolatum  (PREPARATION H) 0.25-3-14-71.9 % rectal ointment Place 1 application rectally 2 (two) times daily as needed for hemorrhoids.    [provider]  tamsulosin (FLOMAX) 0.4 MG CAPS capsule Take 0.4 mg by mouth 2 (two) times daily.  01/13/15   [provider]  vitamin B-12 1000 MCG tablet Take 1 tablet (1,000 mcg total) by mouth daily. 06/21/17   Geradine Girt, DO   Allergies  Allergen Reactions  . Budesonide-Formoterol Fumarate Swelling    Mouth and tongue swelling.  . Iohexol Shortness Of Breath and Itching    Pt was given 100cc of Omnipaque 300 followed by itching/ dyspnea.  Clementeen Hoof [Iodinated Diagnostic Agents] Shortness Of Breath and Other (See Comments)    Iodine pt had a reaction when he had a CT DONE  . Simvastatin Other (See Comments)    REACTION: unknown  . Demerol [Meperidine] Other (See Comments)    REACTION: Hallucinations  . Tape Other (See Comments)    Tears skin off, Please use "paper" tape  . Naproxen Swelling  . Pregabalin Rash  . Sulfonamide Derivatives Rash   Review of Systems  Unable to perform ROS: Age    Physical Exam Vitals signs and nursing note reviewed.  Constitutional:      General: He is not in acute distress.    Appearance: He is ill-appearing.  HENT:     Head: Atraumatic.  Cardiovascular:     Rate and Rhythm: Normal rate.  Pulmonary:     Effort: Pulmonary effort is normal. No respiratory distress.  Skin:    General: Skin  is warm and dry.  Neurological:     Comments: Calm and cooperative, oriented to person only  Psychiatric:        Behavior: Behavior normal.     Vital Signs: BP (!) 148/82   Pulse 71   Temp 97.7 F (36.5 C)   Resp 19   Ht 5\' 9"  (1.753 m)   Wt 74 kg   SpO2 99%   BMI 24.09 kg/m  Pain Scale: PAINAD   Pain Score: Asleep   SpO2: SpO2: 99 % O2 Device:SpO2: 99 % O2 Flow Rate: .   IO: Intake/output summary:   Intake/Output Summary (Last 24 hours) at 06/17/2019 1640 Last data filed at 06/17/2019 0300  Gross per 24 hour  Intake 609.25 ml  Output 200 ml  Net 409.25 ml    LBM: Last BM Date: 06/16/19 Baseline Weight: Weight: 74 kg Most recent weight: Weight: 74 kg     Palliative Assessment/Data:   Flowsheet Rows     Most Recent Value  Intake Tab  Referral Department  Hospitalist  Unit at Time of Referral  Med/Surg Unit  Palliative Care Primary Diagnosis  Other (Comment)  Date Notified  06/17/19  Palliative Care Type  New Palliative care  Reason for referral  Clarify Goals of Care  Date of Admission  06/14/19  Date first seen by Palliative Care  06/17/19  # of days Palliative referral response time  0 Day(s)  # of days IP prior to Palliative referral  3  Clinical Assessment  Palliative Performance Scale Score  40%  Pain Max last 24 hours  Not able to report  Pain Min Last 24 hours  Not able to report  Dyspnea Max Last 24 Hours  Not able to report  Dyspnea Min Last 24 hours  Not able to report  Psychosocial & Spiritual Assessment  Palliative Care Outcomes      Time In: 1530 Time Out: 1640 Time Total: 70 minutes Greater than 50%  of this time was spent counseling and coordinating care related to the above assessment and plan.  Signed by: Drue Novel, NP   Please contact Palliative Medicine Team phone at [phone] for questions and concerns.  For individual provider: See Shea Evans

## 2019-06-17 NOTE — Care Management Important Message (Signed)
Important Message  Patient Details  Name: [name] MRN: [id number] Date of Birth: Mar 14, 1931   Medicare Important Message Given:  Yes     Juliann Pulse A Khylon Davies 06/17/2019, 11:09 AM

## 2019-06-18 MED ORDER — LORAZEPAM 1 MG PO TABS: 1.0000 mg | ORAL_TABLET | ORAL | 0 refills | Status: AC | PRN

## 2019-06-18 MED ORDER — MORPHINE SULFATE (PF) 2 MG/ML IV SOLN
2.0000 mg | INTRAVENOUS | Status: DC | PRN
  Administered 2019-06-18: 12:00:00 2 mg via INTRAVENOUS
  Filled 2019-06-18 (×2): qty 1

## 2019-06-18 MED ORDER — MORPHINE SULFATE (PF) 2 MG/ML IV SOLN
1.0000 mg | INTRAVENOUS | Status: AC
  Administered 2019-06-18: 08:00:00 1 mg via INTRAVENOUS
  Filled 2019-06-18: qty 1

## 2019-06-18 MED ORDER — MORPHINE SULFATE 20 MG/5ML PO SOLN: 2.5000 mg | ORAL | 0 refills | Status: AC | PRN

## 2019-06-18 NOTE — Progress Notes (Signed)
New referral for [name] to follow at home received from [facility]. Patient is an 85 year old man admitted to [facility] on 8/28 from home for evaluation of weakness and elevated fever. He was diagnosed with sepsis and has been treated with IV antibiotics, he has however not improved to baseline. He has remained confused with poor oral intake and continues to have chronic back pain. Plan is for discharge home today with the support of [name] services. Writer spoke in the room with patient's son [name] and daughter [name] regarding [name] services, philosophy and team approach to care with understanding voiced. Writer also made family and referral source [name] that at this time due to [place] 19, [name] is unable to offer in person Volunteer visits, volunteer visits can be done virtually upon request, again understanding voiced. DME needs discussed, family is requesting a hospital bed and over bed table as well as a transport wheel chair. All DME ordered for delivery today with planned discharge to Jeff's house [place] [address].  Patient will transport via EMS with signed DNR in place. Contact numbers given to [place]. Patient information faxed to referral.Will continue to follow through discharge. Hospital care team updated. [name] BSN, RN, [facility]  [facility] [phone]

## 2019-06-18 NOTE — Progress Notes (Signed)
Patients BP was 189/76 HR 62 He has PRN Lebetalol IV but his HR was 62, messaged MD to see if he wanted me to still give it being that his HR was 62. New order for Hydralazine 10mg  IV once.

## 2019-06-18 NOTE — Progress Notes (Signed)
Palliative: Conference with hospice liaison who shares that she has a consult for in-home hospice care.  Goals set him to to shadow.  Plan: Home with hospice care, hopefully today.  No charge Quinn Axe, NP Palliative Medicine Team Team Phone # [phone] Greater than 50% of this time was spent counseling and coordinating care related to the above assessment and plan.

## 2019-06-18 NOTE — TOC Transition Note (Signed)
Transition of Care [facility]) - CM/SW Discharge Note   Patient Details  Name: JOHATHON PERAGINE MRN: [id number] Date of Birth: [birth year]  Transition [facility]) CM/SW Contact:  Shelbie Hutching, RN Phone Number: 06/18/2019, 1:55 PM   Clinical Narrative:    Patient is discharging home with hospice services through [facility].  Patient will transport to son Jeff's home via EMS transport.    Final next level of care: Home w Hospice Care Barriers to Discharge: Barriers Resolved   Patient Goals and CMS Choice Patient states their goals for this hospitalization and ongoing recovery are:: Patient's son HCPOA voices that he would like the patient to go home with hospice care CMS Medicare.gov Compare Post Acute Care list provided to:: Patient Represenative (must comment) Choice offered to / list presented to : [place] / [place]  Discharge Placement                       Discharge Plan and Services   Discharge Planning Services: CM Consult Post Acute Care Choice: Hospice          DME Arranged: Hospital bed           [facility] Agency: [facility]     Representative spoke with at [place]: [name]  Social Determinants of Health (SDOH) Interventions     Readmission Risk Interventions No flowsheet data found.

## 2019-06-18 NOTE — TOC Initial Note (Signed)
Transition of Care [facility]) - Initial/Assessment Note    Patient Details  Name: [name] MRN: [id number] Date of Birth: [birth year]  Transition of Care [facility]) CM/SW Contact:    Shelbie Hutching, RN Phone Number: 06/18/2019, 8:53 AM  Clinical Narrative:                 Patient admitted with sepsis, per MD pt has a poor prognosis and family would like hospice at home.  Patient's son [name] is the [facility] and would like for the patient to be discharged with hospice, the patient will be going to live with [name] when discharged.  Family would like to use [facility] for services, Cruize Shaulis given the referral.  MD reports that Hospice would be appropriate for this patient given poor prognosis.   Patient will need EMS transport to son's home [place] [address].  Patient will need a hospital bed delivered before discharge.    Son and daughter are at the bedside with patient hopefully everything can be arranged for patient to discharge today.  RNCM will cont to help coordinate care.   Expected Discharge Plan: Home w Hospice Care Barriers to Discharge: Other (comment)(hospice vs palliative outpatient)   Patient Goals and CMS Choice Patient states their goals for this hospitalization and ongoing recovery are:: Patient's son HCPOA voices that he would like the patient to go home with hospice care CMS Medicare.gov Compare Post Acute Care list provided to:: Patient Represenative (must comment) Choice offered to / list presented to : [place] / [place]  Expected Discharge Plan and Services Expected Discharge Plan: [place]   Discharge Planning Services: CM Consult Post Acute Care Choice: Hospice Living arrangements for the past 2 months: Single Family Home Expected Discharge Date: 06/18/19               DME Arranged: Hospital bed           [facility] Agency: [facility]     Representative spoke with at [place]: [name]  Prior  Living Arrangements/Services Living arrangements for the past 2 months: [place] Lives with:: Self Patient language and need for interpreter reviewed:: No Do you feel safe going back to the place where you live?: Yes      Need for Family Participation in Patient Care: Yes (Comment) Care giver support system in place?: Yes (comment)(son and daughter)   Criminal Activity/Legal Involvement Pertinent to Current Situation/Hospitalization: No - Comment as needed  Activities of Daily Living Home Assistive Devices/Equipment: Cane (specify quad or straight), Walker (specify type) ADL Screening (condition at time of admission) Patient's cognitive ability adequate to safely complete daily activities?: No Is the patient deaf or have difficulty hearing?: Yes Does the patient have difficulty seeing, even when wearing glasses/contacts?: No Does the patient have difficulty concentrating, remembering, or making decisions?: Yes Patient able to express need for assistance with ADLs?: No Does the patient have difficulty dressing or bathing?: Yes Independently performs ADLs?: No Communication: Independent Dressing (OT): Needs assistance Is this a change from baseline?: Change from baseline, expected to last <3days Grooming: Needs assistance Is this a change from baseline?: Change from baseline, expected to last <3 days Feeding: Needs assistance Is this a change from baseline?: Change from baseline, expected to last <3 days Bathing: Needs assistance Is this a change from baseline?: Change from baseline, expected to last <3 days Toileting: Needs assistance Is this a change from baseline?:  Change from baseline, expected to last <3 days In/Out Bed: Needs assistance Is this a change from baseline?: Change from baseline, expected to last <3 days Walks in Home: Needs assistance Is this a change from baseline?: Pre-admission baseline Does the patient have difficulty walking or climbing stairs?:  Yes Weakness of Legs: Both Weakness of Arms/Hands: Both  Permission Sought/Granted Permission sought to share information with : Facility Sport and exercise psychologist, Tourist information centre manager, Family Supports Permission granted to share information with : Yes, Verbal Permission Granted     Permission granted to share info w AGENCY: [place] granted to share info w Relationship: Son and daughter     Emotional Assessment Appearance:: Appears stated age Attitude/Demeanor/Rapport: Other (comment) Affect (typically observed): Quiet Orientation: : Oriented to Self Alcohol / Substance Use: Not Applicable Psych Involvement: No (comment)  Admission diagnosis:  Sepsis, due to unspecified organism, unspecified whether acute organ dysfunction present [facility]) [A41.9] Patient Active Problem List   Diagnosis Date Noted  . Goals of care, counseling/discussion   . Palliative care by specialist   . Encounter for hospice care discussion   . Sepsis ([place]) 06/14/2019  . GI bleed 09/24/2018  . Medication monitoring encounter 03/22/2018  . Malnutrition of moderate degree 01/22/2018  . Sacral decubitus ulcer, healing stage 4 01/22/2018  . Atrial fibrillation ([place]) 01/21/2018  . Unresponsive episode 01/20/2018  . Elevated troponin 01/20/2018  . Pre-syncope 07/31/2017  . B12 deficiency 06/19/2017  . Essential hypertension   . Bladder outlet obstruction   . Generalized weakness 03/03/2014  . ESOPHAGEAL STRICTURE 09/20/2010  . ORTHOSTATIC DIZZINESS 08/30/2010  . IBS 08/26/2010  . RECTAL BLEEDING 08/26/2010  . RECTAL PAIN 08/26/2010  . ARTHRITIS 08/26/2010  . LOSS OF APPETITE 08/26/2010  . OTHER DYSPHAGIA 08/26/2010  . COLONIC POLYPS, HX OF 08/26/2010  . PALPITATIONS, HX OF 08/18/2010  . PERIPHERAL NEUROPATHY 05/01/2009  . History of myocardial infarction 05/01/2009  . ALLERGIC RHINITIS, SEASONAL 05/01/2009  . DEPRESSION, HX OF 05/01/2009  . Personal history of other diseases of digestive system  05/01/2009  . NEPHROLITHIASIS, HX OF 05/01/2009  . BENIGN PROSTATIC HYPERTROPHY, HX OF 05/01/2009  . LAMINECTOMY, HX OF 05/01/2009  . Hyperlipidemia 10/20/2008  . CAD (coronary artery disease) 10/20/2008  . GERD 10/20/2008   PCP:  [name], FNP Pharmacy:   [place], [place] - [address] [place] [address] Phone: [phone] Fax: [phone]  [facility] [place] [facility] Registered [employer] [facility] [place] [address] Phone: [phone] Fax: [phone]     Social Determinants of Health (SDOH) Interventions    Readmission Risk Interventions No flowsheet data found.

## 2019-06-19 LAB — CULTURE, BLOOD (ROUTINE X 2)
Culture: NO GROWTH
Culture: NO GROWTH
Special Requests: ADEQUATE

## 2019-06-19 NOTE — Discharge Summary (Signed)
[facility] [place] NAME: [name]    MR#:  [id number]  DATE OF BIRTH:  1930-12-02  DATE OF ADMISSION:  06/14/2019   ADMITTING PHYSICIAN: Christel Mormon, MD  DATE OF DISCHARGE: 06/18/2019  4:35 PM  PRIMARY CARE PHYSICIAN: [name], FNP   ADMISSION DIAGNOSIS:  Sepsis, due to unspecified organism, unspecified whether acute organ dysfunction present ([place]) [A41.9] DISCHARGE DIAGNOSIS:  Active Problems:   Sepsis ([place])   Goals of care, counseling/discussion   Palliative care by specialist   Encounter for hospice care discussion  SECONDARY DIAGNOSIS:   Past Medical History:  Diagnosis Date  . Anginal pain ([place])   . Arthritis    "hips, back" (06/17/2015)  . Asthma   . Atrial fibrillation ([place]) 01/21/2018  . Chronic chest pain   . Chronic lower back pain   . Coronary artery disease    a. s/p BMS to RCA in 2002; b. s/p cutting balloon POBA ;   c. cath 6/12: oDx 80% (treated with repeat cutting balloon POBA), mLAD 50% with 30-40% at Dx, CFX 30%, pRCA 25% with patent stents;  d.  Lex MV 4/14:  Low Risk - EF 61%, inf scar with peri-infarct ischemia  . Dyspnea    chronic  . Dysrhythmia   . Essential hypertension   . GERD (gastroesophageal reflux disease)    h/o esophageal spasm  . GI bleed 03/03/2014  . Headache   . History of blood transfusion    "related to OR"  . History of hiatal hernia   . Hyperlipidemia   . Hypertension   . Melanoma of lower back ([place]) late 1990's  . Memory loss   . Myocardial infarction ([place]) 2001   x 1, confirned 1 possible  . Pre-syncope 07/31/2017   HOSPITAL COURSE:  1. Sepsiswith associated metabolic encephalopathy: Present on admission  2. Left hand and wristcellulitis  3.Paroxysmal atrial fibrillation   4. Hypertensiveurgency: Resolved  5. GERD  6. BPH  7. Hypokalemia  8.  Acute delirium  Family chose comfort care and take him home with Hospice which is very  appropriate. DISCHARGE CONDITIONS:  fair CONSULTS OBTAINED:   DRUG ALLERGIES:   Allergies  Allergen Reactions  . Budesonide-Formoterol Fumarate Swelling    Mouth and tongue swelling.  . Iohexol Shortness Of Breath and Itching    Pt was given 100cc of Omnipaque 300 followed by itching/ dyspnea.  Clementeen Hoof [Iodinated Diagnostic Agents] Shortness Of Breath and Other (See Comments)    Iodine pt had a reaction when he had a CT DONE  . Simvastatin Other (See Comments)    REACTION: unknown  . Demerol [Meperidine] Other (See Comments)    REACTION: Hallucinations  . Tape Other (See Comments)    Tears skin off, Please use "paper" tape  . Naproxen Swelling  . Pregabalin Rash  . Sulfonamide Derivatives Rash   DISCHARGE MEDICATIONS:   Allergies as of 06/18/2019      Reactions   Budesonide-formoterol Fumarate Swelling   Mouth and tongue swelling.   Iohexol Shortness Of Breath, Itching   Pt was given 100cc of Omnipaque 300 followed by itching/ dyspnea.   Ivp Dye [iodinated Diagnostic Agents] Shortness Of Breath, Other (See Comments)   Iodine pt had a reaction when he had a CT DONE   Simvastatin Other (See Comments)   REACTION: unknown   Demerol [meperidine] Other (See Comments)   REACTION: Hallucinations   Tape Other (See Comments)  Tears skin off, Please use "paper" tape   Naproxen Swelling   Pregabalin Rash   Sulfonamide Derivatives Rash      Medication List    STOP taking these medications   amiodarone 200 MG tablet Commonly known as: PACERONE   amLODipine 2.5 MG tablet Commonly known as: NORVASC   apixaban 5 MG Tabs tablet Commonly known as: ELIQUIS   atorvastatin 80 MG tablet Commonly known as: LIPITOR   cyanocobalamin 1000 MCG tablet   docusate sodium 100 MG capsule Commonly known as: COLACE   doxazosin 1 MG tablet Commonly known as: CARDURA   Melatonin 10 MG Tabs   nitroGLYCERIN 0.4 MG SL tablet Commonly known as: NITROSTAT   pantoprazole 40 MG tablet  Commonly known as: PROTONIX   phenylephrine-shark liver oil-mineral oil-petrolatum 0.25-3-14-71.9 % rectal ointment Commonly known as: PREPARATION H   tamsulosin 0.4 MG Caps capsule Commonly known as: FLOMAX     TAKE these medications   LORazepam 1 MG tablet Commonly known as: ATIVAN Take 1 tablet (1 mg total) by mouth every 2 (two) hours as needed for anxiety or sleep.   morphine 20 MG/5ML solution Take 0.6 mLs (2.4 mg total) by mouth every 2 (two) hours as needed for pain.        DISCHARGE INSTRUCTIONS:   DIET:  Regular diet DISCHARGE CONDITION:  Serious ACTIVITY:  Activity as tolerated OXYGEN:  Home Oxygen: Yes.    Oxygen Delivery: room air DISCHARGE LOCATION:  home   If you experience worsening of your admission symptoms, develop shortness of breath, life threatening emergency, suicidal or homicidal thoughts you must seek medical attention immediately by calling 911 or calling your MD immediately  if symptoms less severe.  You Must read complete instructions/literature along with all the possible adverse reactions/side effects for all the Medicines you take and that have been prescribed to you. Take any new Medicines after you have completely understood and accpet all the possible adverse reactions/side effects.   Please note  You were cared for by a hospitalist during your hospital stay. If you have any questions about your discharge medications or the care you received while you were in the hospital after you are discharged, you can call the unit and asked to speak with the hospitalist on call if the hospitalist that took care of you is not available. Once you are discharged, your primary care physician will handle any further medical issues. Please note that NO REFILLS for any discharge medications will be authorized once you are discharged, as it is imperative that you return to your primary care physician (or establish a relationship with a primary care physician if  you do not have one) for your aftercare needs so that they can reassess your need for medications and monitor your lab values.    On the day of Discharge:  VITAL SIGNS:  Blood pressure (!) 160/72, pulse 66, temperature 98.9 F (37.2 C), temperature source Oral, resp. rate 20, height 5\' 9"  (1.753 m), weight 74 kg, SpO2 98 %. PHYSICAL EXAMINATION:  GENERAL:  85 y.o.-year-old patient lying in the bed with no acute distress.  EYES: Pupils equal, round, reactive to light and accommodation. No scleral icterus. Extraocular muscles intact.  HEENT: Head atraumatic, normocephalic. Oropharynx and nasopharynx clear.  NECK:  Supple, no jugular venous distention. No thyroid enlargement, no tenderness.  LUNGS: Normal breath sounds bilaterally, no wheezing, rales,rhonchi or crepitation. No use of accessory muscles of respiration.  CARDIOVASCULAR: S1, S2 normal. No murmurs, rubs, or gallops.  ABDOMEN: Soft, non-tender, non-distended. Bowel sounds present. No organomegaly or mass.  EXTREMITIES: No pedal edema, cyanosis, or clubbing.  NEUROLOGIC: Cranial nerves II through XII are intact. Muscle strength 5/5 in all extremities. Sensation intact. Gait not checked.  PSYCHIATRIC: The patient is alert and oriented x 3.  SKIN: No obvious rash, lesion, or ulcer.  DATA REVIEW:   CBC Recent Labs  Lab 06/17/19 0529  WBC 8.2  HGB 10.0*  HCT 30.8*  PLT 274    Chemistries  Recent Labs  Lab 06/14/19 2205  06/17/19 0529  NA 135   < > 139  K 4.0   < > 3.9  CL 102   < > 105  CO2 25   < > 27  GLUCOSE 157*   < > 116*  BUN 16   < > 15  CREATININE 1.14   < > 1.05  CALCIUM 8.9   < > 8.9  AST 21  --   --   ALT 13  --   --   ALKPHOS 92  --   --   BILITOT 0.5  --   --    < > = values in this interval not displayed.      Management plans discussed with the patient, family and they are in agreement.  CODE STATUS: DNR, comfort care/hospice  TOTAL TIME TAKING CARE OF THIS PATIENT: 45 minutes.    Max Sane M.D on 06/19/2019 at 8:23 AM  Between 7am to 6pm - Pager - [phone]  After 6pm go to [email or web address] - Proofreader  [facility] [place] [facility]  Office  [phone]  CC: Primary care physician; [name], FNP   Note: This dictation was prepared with Dragon dictation along with smaller phrase technology. Any transcriptional errors that result from this process are unintentional.

## 2019-07-05 ENCOUNTER — Ambulatory Visit: Payer: Medicare Other | Admitting: Internal Medicine

## 2019-07-18 DEATH — deceased
